# Patient Record
Sex: Female | Born: 1955
Health system: Southern US, Community
[De-identification: ages and names within clinical notes are randomized; demographics above are authoritative.]

## PROBLEM LIST (undated history)

## (undated) DIAGNOSIS — I2510 Atherosclerotic heart disease of native coronary artery without angina pectoris: Secondary | ICD-10-CM

## (undated) DIAGNOSIS — L910 Hypertrophic scar: Secondary | ICD-10-CM

## (undated) DIAGNOSIS — J302 Other seasonal allergic rhinitis: Secondary | ICD-10-CM

## (undated) DIAGNOSIS — K047 Periapical abscess without sinus: Secondary | ICD-10-CM

## (undated) DIAGNOSIS — K469 Unspecified abdominal hernia without obstruction or gangrene: Secondary | ICD-10-CM

## (undated) DIAGNOSIS — G629 Polyneuropathy, unspecified: Secondary | ICD-10-CM

## (undated) DIAGNOSIS — I10 Essential (primary) hypertension: Secondary | ICD-10-CM

## (undated) DIAGNOSIS — I779 Disorder of arteries and arterioles, unspecified: Secondary | ICD-10-CM

## (undated) DIAGNOSIS — C55 Malignant neoplasm of uterus, part unspecified: Secondary | ICD-10-CM

## (undated) DIAGNOSIS — I739 Peripheral vascular disease, unspecified: Secondary | ICD-10-CM

## (undated) DIAGNOSIS — Z9889 Other specified postprocedural states: Secondary | ICD-10-CM

## (undated) DIAGNOSIS — E611 Iron deficiency: Secondary | ICD-10-CM

## (undated) DIAGNOSIS — Z87442 Personal history of urinary calculi: Secondary | ICD-10-CM

## (undated) DIAGNOSIS — R112 Nausea with vomiting, unspecified: Secondary | ICD-10-CM

## (undated) DIAGNOSIS — Z9289 Personal history of other medical treatment: Secondary | ICD-10-CM

## (undated) DIAGNOSIS — R338 Other retention of urine: Secondary | ICD-10-CM

## (undated) DIAGNOSIS — G5603 Carpal tunnel syndrome, bilateral upper limbs: Secondary | ICD-10-CM

## (undated) DIAGNOSIS — D649 Anemia, unspecified: Secondary | ICD-10-CM

## (undated) DIAGNOSIS — E785 Hyperlipidemia, unspecified: Secondary | ICD-10-CM

## (undated) DIAGNOSIS — T7840XA Allergy, unspecified, initial encounter: Secondary | ICD-10-CM

## (undated) DIAGNOSIS — I219 Acute myocardial infarction, unspecified: Secondary | ICD-10-CM

## (undated) DIAGNOSIS — G4730 Sleep apnea, unspecified: Secondary | ICD-10-CM

## (undated) DIAGNOSIS — K648 Other hemorrhoids: Secondary | ICD-10-CM

## (undated) DIAGNOSIS — J45909 Unspecified asthma, uncomplicated: Secondary | ICD-10-CM

## (undated) DIAGNOSIS — K219 Gastro-esophageal reflux disease without esophagitis: Secondary | ICD-10-CM

## (undated) DIAGNOSIS — M1990 Unspecified osteoarthritis, unspecified site: Secondary | ICD-10-CM

## (undated) HISTORY — DX: Hypertrophic scar: L91.0

## (undated) HISTORY — DX: Essential (primary) hypertension: I10

## (undated) HISTORY — DX: Other retention of urine: R33.8

## (undated) HISTORY — DX: Gastro-esophageal reflux disease without esophagitis: K21.9

## (undated) HISTORY — DX: Other hemorrhoids: K64.8

## (undated) HISTORY — PX: CATARACT EXTRACTION: SUR2

## (undated) HISTORY — DX: Other seasonal allergic rhinitis: J30.2

## (undated) HISTORY — PX: BREAST SURGERY: SHX581

## (undated) HISTORY — DX: Unspecified osteoarthritis, unspecified site: M19.90

## (undated) HISTORY — DX: Periapical abscess without sinus: K04.7

## (undated) HISTORY — DX: Disorder of arteries and arterioles, unspecified: I77.9

## (undated) HISTORY — DX: Hyperlipidemia, unspecified: E78.5

## (undated) HISTORY — DX: Iron deficiency: E61.1

## (undated) HISTORY — DX: Atherosclerotic heart disease of native coronary artery without angina pectoris: I25.10

## (undated) HISTORY — DX: Allergy, unspecified, initial encounter: T78.40XA

## (undated) HISTORY — DX: Anemia, unspecified: D64.9

## (undated) HISTORY — PX: COLONOSCOPY W/ BIOPSIES AND POLYPECTOMY: SHX1376

## (undated) HISTORY — DX: Unspecified asthma, uncomplicated: J45.909

## (undated) HISTORY — PX: CARDIAC CATHETERIZATION: SHX172

## (undated) HISTORY — PX: ABDOMINAL HYSTERECTOMY: SHX81

## (undated) HISTORY — DX: Personal history of other medical treatment: Z92.89

## (undated) HISTORY — DX: Acute myocardial infarction, unspecified: I21.9

## (undated) HISTORY — PX: OTHER SURGICAL HISTORY: SHX169

## (undated) HISTORY — PX: WISDOM TOOTH EXTRACTION: SHX21

## (undated) HISTORY — PX: MULTIPLE TOOTH EXTRACTIONS: SHX2053

## (undated) HISTORY — DX: Malignant neoplasm of uterus, part unspecified: C55

## (undated) HISTORY — DX: Morbid (severe) obesity due to excess calories: E66.01

## (undated) HISTORY — DX: Peripheral vascular disease, unspecified: I73.9

---

## 1989-11-22 HISTORY — PX: KNEE ARTHROSCOPY: SUR90

## 1997-11-22 HISTORY — PX: KNEE SURGERY: SHX244

## 1998-07-29 ENCOUNTER — Encounter: Payer: Self-pay | Admitting: Orthopedic Surgery

## 1998-07-30 ENCOUNTER — Ambulatory Visit (HOSPITAL_COMMUNITY): Admission: RE | Admit: 1998-07-30 | Discharge: 1998-07-30 | Payer: Self-pay | Admitting: Orthopedic Surgery

## 1998-08-25 ENCOUNTER — Encounter: Admission: RE | Admit: 1998-08-25 | Discharge: 1998-11-23 | Payer: Self-pay | Admitting: Orthopedic Surgery

## 2000-06-07 ENCOUNTER — Emergency Department (HOSPITAL_COMMUNITY): Admission: EM | Admit: 2000-06-07 | Discharge: 2000-06-07 | Payer: Self-pay | Admitting: Emergency Medicine

## 2000-06-07 ENCOUNTER — Encounter: Payer: Self-pay | Admitting: Emergency Medicine

## 2001-02-26 ENCOUNTER — Emergency Department (HOSPITAL_COMMUNITY): Admission: EM | Admit: 2001-02-26 | Discharge: 2001-02-26 | Payer: Self-pay

## 2001-03-23 ENCOUNTER — Emergency Department (HOSPITAL_COMMUNITY): Admission: EM | Admit: 2001-03-23 | Discharge: 2001-03-23 | Payer: Self-pay | Admitting: Emergency Medicine

## 2001-03-23 ENCOUNTER — Encounter: Payer: Self-pay | Admitting: Emergency Medicine

## 2001-06-08 ENCOUNTER — Inpatient Hospital Stay (HOSPITAL_COMMUNITY): Admission: AD | Admit: 2001-06-08 | Discharge: 2001-06-08 | Payer: Self-pay | Admitting: Family Medicine

## 2001-06-15 ENCOUNTER — Ambulatory Visit (HOSPITAL_COMMUNITY): Admission: RE | Admit: 2001-06-15 | Discharge: 2001-06-15 | Payer: Self-pay | Admitting: Family Medicine

## 2001-06-15 ENCOUNTER — Encounter: Payer: Self-pay | Admitting: Family Medicine

## 2001-07-12 ENCOUNTER — Other Ambulatory Visit: Admission: RE | Admit: 2001-07-12 | Discharge: 2001-07-12 | Payer: Self-pay | Admitting: *Deleted

## 2001-09-20 ENCOUNTER — Encounter: Payer: Self-pay | Admitting: Family Medicine

## 2001-09-20 ENCOUNTER — Encounter: Admission: RE | Admit: 2001-09-20 | Discharge: 2001-09-20 | Payer: Self-pay | Admitting: Family Medicine

## 2001-09-29 ENCOUNTER — Ambulatory Visit (HOSPITAL_COMMUNITY): Admission: RE | Admit: 2001-09-29 | Discharge: 2001-09-29 | Payer: Self-pay | Admitting: *Deleted

## 2001-09-29 ENCOUNTER — Encounter (INDEPENDENT_AMBULATORY_CARE_PROVIDER_SITE_OTHER): Payer: Self-pay | Admitting: *Deleted

## 2004-07-30 ENCOUNTER — Ambulatory Visit: Payer: Self-pay | Admitting: Internal Medicine

## 2004-08-05 ENCOUNTER — Ambulatory Visit: Payer: Self-pay | Admitting: Internal Medicine

## 2004-09-01 ENCOUNTER — Ambulatory Visit: Payer: Self-pay | Admitting: Family Medicine

## 2004-09-02 ENCOUNTER — Ambulatory Visit: Payer: Self-pay | Admitting: *Deleted

## 2004-09-07 ENCOUNTER — Ambulatory Visit: Payer: Self-pay | Admitting: *Deleted

## 2004-09-07 ENCOUNTER — Ambulatory Visit: Payer: Self-pay | Admitting: Internal Medicine

## 2004-10-28 ENCOUNTER — Ambulatory Visit: Payer: Self-pay | Admitting: Family Medicine

## 2004-11-19 ENCOUNTER — Ambulatory Visit: Payer: Self-pay | Admitting: Family Medicine

## 2004-12-14 ENCOUNTER — Ambulatory Visit: Payer: Self-pay | Admitting: Internal Medicine

## 2005-02-10 ENCOUNTER — Ambulatory Visit: Payer: Self-pay | Admitting: Family Medicine

## 2005-04-30 ENCOUNTER — Emergency Department (HOSPITAL_COMMUNITY): Admission: EM | Admit: 2005-04-30 | Discharge: 2005-04-30 | Payer: Self-pay | Admitting: Emergency Medicine

## 2005-08-20 ENCOUNTER — Ambulatory Visit: Payer: Self-pay | Admitting: Family Medicine

## 2006-01-04 ENCOUNTER — Emergency Department (HOSPITAL_COMMUNITY): Admission: EM | Admit: 2006-01-04 | Discharge: 2006-01-04 | Payer: Self-pay | Admitting: Family Medicine

## 2006-10-12 ENCOUNTER — Inpatient Hospital Stay (HOSPITAL_COMMUNITY): Admission: EM | Admit: 2006-10-12 | Discharge: 2006-10-16 | Payer: Self-pay | Admitting: Emergency Medicine

## 2006-10-14 ENCOUNTER — Ambulatory Visit: Payer: Self-pay | Admitting: Cardiology

## 2006-10-14 ENCOUNTER — Encounter (INDEPENDENT_AMBULATORY_CARE_PROVIDER_SITE_OTHER): Payer: Self-pay | Admitting: Family Medicine

## 2006-10-14 ENCOUNTER — Encounter: Payer: Self-pay | Admitting: Cardiology

## 2006-10-14 ENCOUNTER — Encounter: Payer: Self-pay | Admitting: Vascular Surgery

## 2006-10-16 ENCOUNTER — Encounter (INDEPENDENT_AMBULATORY_CARE_PROVIDER_SITE_OTHER): Payer: Self-pay | Admitting: Family Medicine

## 2006-10-25 ENCOUNTER — Ambulatory Visit: Payer: Self-pay | Admitting: Family Medicine

## 2007-04-28 ENCOUNTER — Emergency Department (HOSPITAL_COMMUNITY): Admission: EM | Admit: 2007-04-28 | Discharge: 2007-04-28 | Payer: Self-pay | Admitting: Emergency Medicine

## 2007-06-30 ENCOUNTER — Encounter (INDEPENDENT_AMBULATORY_CARE_PROVIDER_SITE_OTHER): Payer: Self-pay | Admitting: Family Medicine

## 2007-06-30 DIAGNOSIS — J309 Allergic rhinitis, unspecified: Secondary | ICD-10-CM

## 2007-06-30 DIAGNOSIS — I10 Essential (primary) hypertension: Secondary | ICD-10-CM | POA: Insufficient documentation

## 2007-06-30 DIAGNOSIS — J45909 Unspecified asthma, uncomplicated: Secondary | ICD-10-CM | POA: Insufficient documentation

## 2007-08-02 ENCOUNTER — Ambulatory Visit: Payer: Self-pay | Admitting: Family Medicine

## 2007-08-02 ENCOUNTER — Telehealth (INDEPENDENT_AMBULATORY_CARE_PROVIDER_SITE_OTHER): Payer: Self-pay | Admitting: *Deleted

## 2007-08-08 ENCOUNTER — Ambulatory Visit: Payer: Self-pay | Admitting: Family Medicine

## 2007-08-08 DIAGNOSIS — L299 Pruritus, unspecified: Secondary | ICD-10-CM | POA: Insufficient documentation

## 2007-08-08 LAB — CONVERTED CEMR LAB
AST: 16 units/L (ref 0–37)
Alkaline Phosphatase: 45 units/L (ref 39–117)
BUN: 11 mg/dL (ref 6–23)
CO2: 26 meq/L (ref 19–32)
Calcium: 9.2 mg/dL (ref 8.4–10.5)
Chloride: 102 meq/L (ref 96–112)
Creatinine, Ser: 0.75 mg/dL (ref 0.40–1.20)
Glucose, Bld: 102 mg/dL — ABNORMAL HIGH (ref 70–99)
HDL: 51 mg/dL (ref >39)
Potassium: 4.1 meq/L (ref 3.5–5.3)
Sodium: 140 meq/L (ref 135–145)
Total Bilirubin: 0.6 mg/dL (ref 0.3–1.2)

## 2007-08-12 ENCOUNTER — Emergency Department (HOSPITAL_COMMUNITY): Admission: EM | Admit: 2007-08-12 | Discharge: 2007-08-13 | Payer: Self-pay | Admitting: Emergency Medicine

## 2007-08-28 ENCOUNTER — Telehealth (INDEPENDENT_AMBULATORY_CARE_PROVIDER_SITE_OTHER): Payer: Self-pay | Admitting: Family Medicine

## 2007-09-18 ENCOUNTER — Encounter (INDEPENDENT_AMBULATORY_CARE_PROVIDER_SITE_OTHER): Payer: Self-pay | Admitting: Family Medicine

## 2007-10-31 ENCOUNTER — Ambulatory Visit: Payer: Self-pay | Admitting: Family Medicine

## 2007-10-31 DIAGNOSIS — E785 Hyperlipidemia, unspecified: Secondary | ICD-10-CM

## 2007-11-23 HISTORY — PX: COLONOSCOPY: SHX174

## 2008-01-09 ENCOUNTER — Emergency Department (HOSPITAL_COMMUNITY): Admission: EM | Admit: 2008-01-09 | Discharge: 2008-01-09 | Payer: Self-pay | Admitting: Emergency Medicine

## 2008-01-16 ENCOUNTER — Telehealth (INDEPENDENT_AMBULATORY_CARE_PROVIDER_SITE_OTHER): Payer: Self-pay | Admitting: Family Medicine

## 2008-01-25 ENCOUNTER — Encounter (INDEPENDENT_AMBULATORY_CARE_PROVIDER_SITE_OTHER): Payer: Self-pay | Admitting: *Deleted

## 2008-05-01 ENCOUNTER — Ambulatory Visit: Payer: Self-pay | Admitting: Family Medicine

## 2008-05-20 ENCOUNTER — Encounter (INDEPENDENT_AMBULATORY_CARE_PROVIDER_SITE_OTHER): Payer: Self-pay | Admitting: Family Medicine

## 2008-06-24 ENCOUNTER — Ambulatory Visit: Payer: Self-pay | Admitting: Nurse Practitioner

## 2008-06-24 DIAGNOSIS — J329 Chronic sinusitis, unspecified: Secondary | ICD-10-CM

## 2008-07-01 ENCOUNTER — Emergency Department (HOSPITAL_COMMUNITY): Admission: EM | Admit: 2008-07-01 | Discharge: 2008-07-01 | Payer: Self-pay | Admitting: Emergency Medicine

## 2008-07-05 ENCOUNTER — Telehealth (INDEPENDENT_AMBULATORY_CARE_PROVIDER_SITE_OTHER): Payer: Self-pay | Admitting: Family Medicine

## 2008-08-05 ENCOUNTER — Encounter (INDEPENDENT_AMBULATORY_CARE_PROVIDER_SITE_OTHER): Payer: Self-pay | Admitting: *Deleted

## 2008-08-06 ENCOUNTER — Ambulatory Visit: Payer: Self-pay | Admitting: Family Medicine

## 2008-08-06 DIAGNOSIS — K625 Hemorrhage of anus and rectum: Secondary | ICD-10-CM

## 2008-08-06 DIAGNOSIS — K219 Gastro-esophageal reflux disease without esophagitis: Secondary | ICD-10-CM

## 2008-08-06 DIAGNOSIS — K5900 Constipation, unspecified: Secondary | ICD-10-CM | POA: Insufficient documentation

## 2008-08-07 ENCOUNTER — Encounter (INDEPENDENT_AMBULATORY_CARE_PROVIDER_SITE_OTHER): Payer: Self-pay | Admitting: Family Medicine

## 2008-08-15 ENCOUNTER — Ambulatory Visit: Payer: Self-pay | Admitting: Family Medicine

## 2008-08-15 LAB — CONVERTED CEMR LAB
BUN: 12 mg/dL (ref 6–23)
CO2: 26 meq/L (ref 19–32)
Calcium: 9.3 mg/dL (ref 8.4–10.5)
Chloride: 106 meq/L (ref 96–112)
Eosinophils Relative: 1 % (ref 0–5)
HCT: 39.7 % (ref 36.0–46.0)
LDL Cholesterol: 120 mg/dL — ABNORMAL HIGH (ref 0–99)
MCHC: 31.2 g/dL (ref 30.0–36.0)
MCV: 86.9 fL (ref 78.0–100.0)
Monocytes Relative: 7 % (ref 3–12)
Neutrophils Relative %: 57 % (ref 43–77)
Platelets: 255 10*3/uL (ref 150–400)
Potassium: 4.2 meq/L (ref 3.5–5.3)
RBC: 4.57 M/uL (ref 3.87–5.11)
RDW: 16.1 % — ABNORMAL HIGH (ref 11.5–15.5)
TSH: 1.222 microintl units/mL (ref 0.350–4.50)
Total Bilirubin: 0.8 mg/dL (ref 0.3–1.2)
Total CHOL/HDL Ratio: 3.4
Total Protein: 7.1 g/dL (ref 6.0–8.3)
Triglycerides: 68 mg/dL (ref <150)
WBC: 7 10*3/uL (ref 4.0–10.5)

## 2008-08-19 ENCOUNTER — Telehealth (INDEPENDENT_AMBULATORY_CARE_PROVIDER_SITE_OTHER): Payer: Self-pay | Admitting: Family Medicine

## 2008-08-19 ENCOUNTER — Encounter (INDEPENDENT_AMBULATORY_CARE_PROVIDER_SITE_OTHER): Payer: Self-pay | Admitting: Family Medicine

## 2008-08-26 ENCOUNTER — Encounter (INDEPENDENT_AMBULATORY_CARE_PROVIDER_SITE_OTHER): Payer: Self-pay | Admitting: Family Medicine

## 2008-09-04 ENCOUNTER — Ambulatory Visit (HOSPITAL_COMMUNITY): Admission: RE | Admit: 2008-09-04 | Discharge: 2008-09-04 | Payer: Self-pay

## 2008-09-04 ENCOUNTER — Encounter (INDEPENDENT_AMBULATORY_CARE_PROVIDER_SITE_OTHER): Payer: Self-pay | Admitting: Family Medicine

## 2008-09-05 ENCOUNTER — Encounter (INDEPENDENT_AMBULATORY_CARE_PROVIDER_SITE_OTHER): Payer: Self-pay | Admitting: Family Medicine

## 2008-10-03 ENCOUNTER — Encounter (INDEPENDENT_AMBULATORY_CARE_PROVIDER_SITE_OTHER): Payer: Self-pay | Admitting: Family Medicine

## 2008-10-23 ENCOUNTER — Encounter (INDEPENDENT_AMBULATORY_CARE_PROVIDER_SITE_OTHER): Payer: Self-pay | Admitting: Family Medicine

## 2009-01-07 ENCOUNTER — Ambulatory Visit: Payer: Self-pay | Admitting: Family Medicine

## 2009-01-07 DIAGNOSIS — H6120 Impacted cerumen, unspecified ear: Secondary | ICD-10-CM | POA: Insufficient documentation

## 2009-01-07 DIAGNOSIS — M21619 Bunion of unspecified foot: Secondary | ICD-10-CM

## 2009-01-07 DIAGNOSIS — M25569 Pain in unspecified knee: Secondary | ICD-10-CM

## 2009-01-15 ENCOUNTER — Ambulatory Visit (HOSPITAL_COMMUNITY): Admission: RE | Admit: 2009-01-15 | Discharge: 2009-01-15 | Payer: Self-pay | Admitting: Family Medicine

## 2009-01-16 ENCOUNTER — Telehealth (INDEPENDENT_AMBULATORY_CARE_PROVIDER_SITE_OTHER): Payer: Self-pay | Admitting: Family Medicine

## 2009-01-21 ENCOUNTER — Ambulatory Visit: Payer: Self-pay | Admitting: Family Medicine

## 2009-01-21 DIAGNOSIS — K089 Disorder of teeth and supporting structures, unspecified: Secondary | ICD-10-CM | POA: Insufficient documentation

## 2009-02-07 ENCOUNTER — Encounter (INDEPENDENT_AMBULATORY_CARE_PROVIDER_SITE_OTHER): Payer: Self-pay | Admitting: Family Medicine

## 2009-02-19 ENCOUNTER — Encounter: Admission: RE | Admit: 2009-02-19 | Discharge: 2009-02-19 | Payer: Self-pay | Admitting: Orthopedic Surgery

## 2009-07-25 ENCOUNTER — Emergency Department (HOSPITAL_COMMUNITY): Admission: EM | Admit: 2009-07-25 | Discharge: 2009-07-25 | Payer: Self-pay | Admitting: Emergency Medicine

## 2009-08-19 ENCOUNTER — Encounter: Admission: RE | Admit: 2009-08-19 | Discharge: 2009-08-19 | Payer: Self-pay | Admitting: Orthopedic Surgery

## 2009-08-20 ENCOUNTER — Encounter (INDEPENDENT_AMBULATORY_CARE_PROVIDER_SITE_OTHER): Payer: Self-pay | Admitting: Family Medicine

## 2009-09-16 ENCOUNTER — Encounter (INDEPENDENT_AMBULATORY_CARE_PROVIDER_SITE_OTHER): Payer: Self-pay | Admitting: Nurse Practitioner

## 2009-09-18 ENCOUNTER — Ambulatory Visit: Payer: Self-pay | Admitting: Nurse Practitioner

## 2009-09-18 DIAGNOSIS — H9319 Tinnitus, unspecified ear: Secondary | ICD-10-CM | POA: Insufficient documentation

## 2009-09-22 ENCOUNTER — Encounter (INDEPENDENT_AMBULATORY_CARE_PROVIDER_SITE_OTHER): Payer: Self-pay | Admitting: Nurse Practitioner

## 2009-09-22 LAB — CONVERTED CEMR LAB
AST: 18 units/L (ref 0–37)
Albumin: 4.1 g/dL (ref 3.5–5.2)
Anti Nuclear Antibody(ANA): NEGATIVE
BUN: 9 mg/dL (ref 6–23)
Calcium: 9.6 mg/dL (ref 8.4–10.5)
Chloride: 103 meq/L (ref 96–112)
Eosinophils Relative: 3 % (ref 0–5)
Free T4: 1.1 ng/dL (ref 0.80–1.80)
HCT: 41.4 % (ref 36.0–46.0)
HDL: 49 mg/dL (ref >39)
Hemoglobin: 13.4 g/dL (ref 12.0–15.0)
Lymphocytes Relative: 43 % (ref 12–46)
Lymphs Abs: 2.6 10*3/uL (ref 0.7–4.0)
Monocytes Absolute: 0.4 10*3/uL (ref 0.1–1.0)
Platelets: 243 10*3/uL (ref 150–400)
Potassium: 4.5 meq/L (ref 3.5–5.3)
Rhuematoid fact SerPl-aCnc: 22 intl units/mL — ABNORMAL HIGH (ref 0–20)
TSH: 1.641 microintl units/mL (ref 0.350–4.500)
Total Protein: 7.2 g/dL (ref 6.0–8.3)
WBC: 6 10*3/uL (ref 4.0–10.5)

## 2009-10-14 ENCOUNTER — Ambulatory Visit: Payer: Self-pay | Admitting: Nurse Practitioner

## 2009-10-14 DIAGNOSIS — H6690 Otitis media, unspecified, unspecified ear: Secondary | ICD-10-CM | POA: Insufficient documentation

## 2009-10-21 ENCOUNTER — Ambulatory Visit (HOSPITAL_COMMUNITY): Admission: RE | Admit: 2009-10-21 | Discharge: 2009-10-21 | Payer: Self-pay | Admitting: Internal Medicine

## 2009-10-28 ENCOUNTER — Telehealth (INDEPENDENT_AMBULATORY_CARE_PROVIDER_SITE_OTHER): Payer: Self-pay | Admitting: Family Medicine

## 2009-12-15 ENCOUNTER — Ambulatory Visit: Payer: Self-pay | Admitting: Nurse Practitioner

## 2009-12-15 DIAGNOSIS — R319 Hematuria, unspecified: Secondary | ICD-10-CM

## 2009-12-15 DIAGNOSIS — K644 Residual hemorrhoidal skin tags: Secondary | ICD-10-CM | POA: Insufficient documentation

## 2009-12-16 ENCOUNTER — Encounter (INDEPENDENT_AMBULATORY_CARE_PROVIDER_SITE_OTHER): Payer: Self-pay | Admitting: Nurse Practitioner

## 2009-12-17 ENCOUNTER — Encounter (INDEPENDENT_AMBULATORY_CARE_PROVIDER_SITE_OTHER): Payer: Self-pay | Admitting: Nurse Practitioner

## 2010-01-20 HISTORY — PX: TEE WITHOUT CARDIOVERSION: SHX5443

## 2010-01-20 HISTORY — PX: NM MYOCAR PERF WALL MOTION: HXRAD629

## 2010-01-27 ENCOUNTER — Observation Stay (HOSPITAL_COMMUNITY): Admission: EM | Admit: 2010-01-27 | Discharge: 2010-01-30 | Payer: Self-pay | Admitting: Emergency Medicine

## 2010-01-28 ENCOUNTER — Encounter (INDEPENDENT_AMBULATORY_CARE_PROVIDER_SITE_OTHER): Payer: Self-pay | Admitting: Internal Medicine

## 2010-01-28 ENCOUNTER — Ambulatory Visit: Payer: Self-pay | Admitting: Vascular Surgery

## 2010-01-30 ENCOUNTER — Encounter (INDEPENDENT_AMBULATORY_CARE_PROVIDER_SITE_OTHER): Payer: Self-pay | Admitting: Cardiology

## 2010-02-12 ENCOUNTER — Ambulatory Visit: Payer: Self-pay | Admitting: Nurse Practitioner

## 2010-02-12 DIAGNOSIS — N959 Unspecified menopausal and perimenopausal disorder: Secondary | ICD-10-CM | POA: Insufficient documentation

## 2010-02-18 ENCOUNTER — Encounter (INDEPENDENT_AMBULATORY_CARE_PROVIDER_SITE_OTHER): Payer: Self-pay | Admitting: Nurse Practitioner

## 2010-02-18 ENCOUNTER — Ambulatory Visit (HOSPITAL_COMMUNITY): Admission: RE | Admit: 2010-02-18 | Discharge: 2010-02-18 | Payer: Self-pay | Admitting: Nurse Practitioner

## 2010-02-19 ENCOUNTER — Encounter (INDEPENDENT_AMBULATORY_CARE_PROVIDER_SITE_OTHER): Payer: Self-pay | Admitting: Nurse Practitioner

## 2010-02-27 ENCOUNTER — Encounter (INDEPENDENT_AMBULATORY_CARE_PROVIDER_SITE_OTHER): Payer: Self-pay | Admitting: Nurse Practitioner

## 2010-03-03 ENCOUNTER — Encounter (INDEPENDENT_AMBULATORY_CARE_PROVIDER_SITE_OTHER): Payer: Self-pay | Admitting: Nurse Practitioner

## 2010-03-04 ENCOUNTER — Encounter (INDEPENDENT_AMBULATORY_CARE_PROVIDER_SITE_OTHER): Payer: Self-pay | Admitting: Nurse Practitioner

## 2010-03-17 ENCOUNTER — Ambulatory Visit (HOSPITAL_BASED_OUTPATIENT_CLINIC_OR_DEPARTMENT_OTHER): Admission: RE | Admit: 2010-03-17 | Discharge: 2010-03-17 | Payer: Self-pay | Admitting: Nurse Practitioner

## 2010-03-17 ENCOUNTER — Encounter (INDEPENDENT_AMBULATORY_CARE_PROVIDER_SITE_OTHER): Payer: Self-pay | Admitting: Nurse Practitioner

## 2010-03-21 ENCOUNTER — Ambulatory Visit: Payer: Self-pay | Admitting: Internal Medicine

## 2010-03-27 ENCOUNTER — Encounter (INDEPENDENT_AMBULATORY_CARE_PROVIDER_SITE_OTHER): Payer: Self-pay | Admitting: Nurse Practitioner

## 2010-03-27 DIAGNOSIS — G4733 Obstructive sleep apnea (adult) (pediatric): Secondary | ICD-10-CM

## 2010-04-10 ENCOUNTER — Ambulatory Visit: Payer: Self-pay | Admitting: Nurse Practitioner

## 2010-04-24 ENCOUNTER — Ambulatory Visit: Payer: Self-pay | Admitting: Nurse Practitioner

## 2010-05-28 ENCOUNTER — Ambulatory Visit (HOSPITAL_COMMUNITY): Admission: RE | Admit: 2010-05-28 | Discharge: 2010-05-28 | Payer: Self-pay | Admitting: Orthopedic Surgery

## 2010-06-03 ENCOUNTER — Encounter (INDEPENDENT_AMBULATORY_CARE_PROVIDER_SITE_OTHER): Payer: Self-pay | Admitting: Nurse Practitioner

## 2010-06-08 ENCOUNTER — Encounter (INDEPENDENT_AMBULATORY_CARE_PROVIDER_SITE_OTHER): Payer: Self-pay | Admitting: Nurse Practitioner

## 2010-06-12 ENCOUNTER — Ambulatory Visit: Payer: Self-pay | Admitting: Nurse Practitioner

## 2010-06-18 ENCOUNTER — Encounter: Admission: RE | Admit: 2010-06-18 | Discharge: 2010-07-28 | Payer: Self-pay | Admitting: Orthopedic Surgery

## 2010-06-19 ENCOUNTER — Encounter (INDEPENDENT_AMBULATORY_CARE_PROVIDER_SITE_OTHER): Payer: Self-pay | Admitting: Nurse Practitioner

## 2010-07-24 ENCOUNTER — Ambulatory Visit: Payer: Self-pay | Admitting: Nurse Practitioner

## 2010-08-06 ENCOUNTER — Ambulatory Visit (HOSPITAL_COMMUNITY): Admission: RE | Admit: 2010-08-06 | Discharge: 2010-08-07 | Payer: Self-pay | Admitting: Orthopedic Surgery

## 2010-08-09 ENCOUNTER — Emergency Department (HOSPITAL_COMMUNITY)
Admission: EM | Admit: 2010-08-09 | Discharge: 2010-08-10 | Payer: Self-pay | Source: Home / Self Care | Admitting: Emergency Medicine

## 2010-09-25 ENCOUNTER — Ambulatory Visit: Payer: Self-pay | Admitting: Nurse Practitioner

## 2010-09-25 LAB — CONVERTED CEMR LAB
ALT: 20 units/L (ref 0–35)
AST: 21 units/L (ref 0–37)
Alkaline Phosphatase: 48 units/L (ref 39–117)
BUN: 14 mg/dL (ref 6–23)
Basophils Absolute: 0 10*3/uL (ref 0.0–0.1)
Basophils Relative: 0 % (ref 0–1)
Calcium: 9.6 mg/dL (ref 8.4–10.5)
Chloride: 98 meq/L (ref 96–112)
Creatinine, Ser: 0.89 mg/dL (ref 0.40–1.20)
Eosinophils Absolute: 0.1 10*3/uL (ref 0.0–0.7)
HDL: 50 mg/dL (ref >39)
Hemoglobin: 13 g/dL (ref 12.0–15.0)
LDL Cholesterol: 94 mg/dL (ref 0–99)
MCHC: 32.7 g/dL (ref 30.0–36.0)
MCV: 85 fL (ref 78.0–100.0)
Monocytes Absolute: 0.4 10*3/uL (ref 0.1–1.0)
Monocytes Relative: 6 % (ref 3–12)
Neutro Abs: 3.8 10*3/uL (ref 1.7–7.7)
Neutrophils Relative %: 56 % (ref 43–77)
RDW: 15.1 % (ref 11.5–15.5)
TSH: 1.503 microintl units/mL (ref 0.350–4.500)
Total CHOL/HDL Ratio: 3.2
VLDL: 18 mg/dL (ref 0–40)

## 2010-09-28 ENCOUNTER — Encounter (INDEPENDENT_AMBULATORY_CARE_PROVIDER_SITE_OTHER): Payer: Self-pay | Admitting: Nurse Practitioner

## 2010-10-23 ENCOUNTER — Ambulatory Visit (HOSPITAL_COMMUNITY): Admission: RE | Admit: 2010-10-23 | Payer: Self-pay | Admitting: Internal Medicine

## 2010-11-04 ENCOUNTER — Telehealth (INDEPENDENT_AMBULATORY_CARE_PROVIDER_SITE_OTHER): Payer: Self-pay | Admitting: Nurse Practitioner

## 2010-11-05 ENCOUNTER — Ambulatory Visit: Payer: Self-pay | Admitting: Nurse Practitioner

## 2010-11-22 ENCOUNTER — Emergency Department (HOSPITAL_COMMUNITY)
Admission: EM | Admit: 2010-11-22 | Discharge: 2010-11-22 | Payer: Self-pay | Source: Home / Self Care | Admitting: Emergency Medicine

## 2010-11-22 LAB — CONVERTED CEMR LAB
Albumin: 3.6 g/dL
Basophils Absolute: 0 10*3/uL
CO2: 29 meq/L
Calcium: 9.5 mg/dL
Chloride: 100 meq/L
Glucose, Bld: 139 mg/dL
Hemoglobin: 13.5 g/dL
Lymphocytes Relative: 35 %
Lymphs Abs: 2.7 10*3/uL
Neutro Abs: 4.4 10*3/uL
Platelets: 249 10*3/uL
RDW: 14.4 %
Sodium: 138 meq/L
Total Protein: 7.9 g/dL
WBC: 7.8 10*3/uL

## 2010-12-01 ENCOUNTER — Encounter (INDEPENDENT_AMBULATORY_CARE_PROVIDER_SITE_OTHER): Payer: Self-pay | Admitting: Nurse Practitioner

## 2010-12-04 ENCOUNTER — Ambulatory Visit
Admission: RE | Admit: 2010-12-04 | Discharge: 2010-12-04 | Payer: Self-pay | Source: Home / Self Care | Attending: Nurse Practitioner | Admitting: Nurse Practitioner

## 2010-12-04 ENCOUNTER — Encounter (INDEPENDENT_AMBULATORY_CARE_PROVIDER_SITE_OTHER): Payer: Self-pay | Admitting: Nurse Practitioner

## 2010-12-04 LAB — CONVERTED CEMR LAB
Cholesterol: 190 mg/dL (ref 0–200)
LDL Cholesterol: 123 mg/dL — ABNORMAL HIGH (ref 0–99)
Triglycerides: 80 mg/dL (ref <150)

## 2010-12-07 ENCOUNTER — Encounter (INDEPENDENT_AMBULATORY_CARE_PROVIDER_SITE_OTHER): Payer: Self-pay | Admitting: Nurse Practitioner

## 2010-12-13 ENCOUNTER — Encounter: Payer: Self-pay | Admitting: Family Medicine

## 2010-12-13 ENCOUNTER — Encounter: Payer: Self-pay | Admitting: Internal Medicine

## 2010-12-13 ENCOUNTER — Encounter: Payer: Self-pay | Admitting: Orthopedic Surgery

## 2010-12-14 ENCOUNTER — Encounter: Payer: Self-pay | Admitting: Occupational Therapy

## 2010-12-18 ENCOUNTER — Ambulatory Visit (HOSPITAL_COMMUNITY)
Admission: RE | Admit: 2010-12-18 | Discharge: 2010-12-18 | Payer: Self-pay | Source: Home / Self Care | Attending: Internal Medicine | Admitting: Internal Medicine

## 2010-12-20 LAB — CONVERTED CEMR LAB
AST: 20 units/L (ref 0–37)
Albumin: 4.2 g/dL (ref 3.5–5.2)
Alkaline Phosphatase: 41 units/L (ref 39–117)
Chlamydia, DNA Probe: NEGATIVE
GC Probe Amp, Genital: NEGATIVE
HDL: 44 mg/dL (ref >39)
Indirect Bilirubin: 0.5 mg/dL (ref 0.0–0.9)
Nitrite: NEGATIVE
Total Protein: 7.7 g/dL (ref 6.0–8.3)
Triglycerides: 93 mg/dL (ref <150)
Urobilinogen, UA: 1
WBC Urine, dipstick: NEGATIVE

## 2010-12-22 NOTE — Assessment & Plan Note (Signed)
Summary: HTN/Hypercholesterolemia   Vital Signs:  Patient profile:   55 year old female Menstrual status:  postmenopausal Weight:      319.7 pounds BMI:     48.79 Temp:     97.5 degrees F oral Pulse rate:   80 / minute Pulse rhythm:   regular Resp:     20 per minute BP sitting:   150 / 80  (left arm) Cuff size:   large  Vitals Entered By: Levon Hedger (July 24, 2010 8:36 AM)  Nutrition Counseling: Patient's BMI is greater than 25 and therefore counseled on weight management options. CC: follow-up visit...left ear still throbbing, Hypertension Management, Lipid Management, Abdominal Pain Is Patient Diabetic? No Pain Assessment Patient in pain? yes     Location: knee, gums Intensity: 7,2  Does patient need assistance? Functional Status Self care Ambulation Normal   Primary Care Provider:  Rankins  CC:  follow-up visit...left ear still throbbing, Hypertension Management, Lipid Management, and Abdominal Pain.  History of Present Illness:  Pt into the office for f/u - HTN  S/p right arthroscopy on 05/28/2010 (pt is recovering well) Pt went to ortho on yesterday and she will need left knee intervention; unsure if she can have arthroscopy or TKR. Physical therapy twice per week (Tuesday and Thursday)  Oral surgery - s/p 6 dental extractions and she had an RX for demoral as needed for pain.  She does have to get some other dental work completed on next week.  Medications present today with pt  Obesity - down 4 pounds since last visit  "Probably because i can't eat"  Dyspepsia History:      She has no alarm features of dyspepsia including no history of melena, hematochezia, dysphagia, persistent vomiting, or involuntary weight loss > 5%.  There is a prior history of GERD.  The patient does not have a prior history of documented ulcer disease.  The dominant symptom is heartburn or acid reflux.  An H-2 blocker medication is not currently being taken.  She has no history  of a positive H. Pylori serology.  No previous upper endoscopy has been done.    Hypertension History:      She denies headache, chest pain, and palpitations.  She notes no problems with any antihypertensive medication side effects.  pt just took diovan 30 minutes before she came into the office today.        Positive major cardiovascular risk factors include female age 68 years old or older, hyperlipidemia, and hypertension.  Negative major cardiovascular risk factors include no history of diabetes, negative family history for ischemic heart disease, and non-tobacco-user status.        Further assessment for target organ damage reveals no history of ASHD, cardiac end-organ damage (CHF/LVH), stroke/TIA, peripheral vascular disease, renal insufficiency, or hypertensive retinopathy.    Lipid Management History:      Positive NCEP/ATP III risk factors include female age 4 years old or older and hypertension.  Negative NCEP/ATP III risk factors include no history of early menopause without estrogen hormone replacement, non-diabetic, no family history for ischemic heart disease, non-tobacco-user status, no ASHD (atherosclerotic heart disease), no prior stroke/TIA, no peripheral vascular disease, and no history of aortic aneurysm.        The patient states that she does not know about the "Therapeutic Lifestyle Change" diet.  The patient does not know about adjunctive measures for cholesterol lowering.  Adjunctive measures started by the patient include weight reduction.  She expresses no  side effects from her lipid-lowering medication.  The patient denies any symptoms to suggest myopathy or liver disease.      Habits & Providers  Alcohol-Tobacco-Diet     Alcohol drinks/day: 0     Tobacco Status: never     Passive Smoke Exposure: yes  Exercise-Depression-Behavior     Does Patient Exercise: no     Have you felt down or hopeless? no     Have you felt little pleasure in things? no     Depression  Counseling: not indicated; screening negative for depression     Drug Use: no     Seat Belt Use: 50     Sun Exposure: occasionally  Allergies (verified): 1)  ! Penicillin 2)  ! Codeine 3)  ! Percocet 4)  ! * Latex  Review of Systems ENT:  Complains of earache; still with left ear throbbing; s/p ear irrigation on left ear during the last visit.  Pt reports that she had a hearing test at an outside clinic and one of her ears was poor.. CV:  Denies chest pain or discomfort. Resp:  Denies cough. GI:  Denies abdominal pain, nausea, and vomiting. MS:  Complains of joint pain; right knee and bilateral shoulders. Neuro:  Complains of numbness and tingling; bil hands.  Physical Exam  General:  alert.  obese Head:  normocephalic.   Ears:  bil Tm with bony landmarks visible - no erythema Mouth:  pharynx pink and moist.   Msk:  up to exam table - no asssit Neurologic:  alert & oriented X3.   Skin:  color normal.   Psych:  Oriented X3.     Impression & Recommendations:  Problem # 1:  HYPERTENSION (ICD-401.9) BP is elevated today but pt just took meds 30 minutes before this visit Reminded of DASH diet Her updated medication list for this problem includes:    Diovan Hct 160-25 Mg Tabs (Valsartan-hydrochlorothiazide) ..... One tablet by mouth daily  Problem # 2:  OBESITY (ICD-278.00) down 4 pounds since last visit - pt is still not able to exercise due to knee pain so continue to advise pt to monitor diet  Problem # 3:  KNEE PAIN, BILATERAL (ICD-719.46) s/p right knee arthoscopy on 05/28/2010 and pt is still getting better she is pending intervention on left knee  arthrotec as needed  The following medications were removed from the medication list:    Tramadol-acetaminophen 37.5-325 Mg Tabs (Tramadol-acetaminophen) .Marland Kitchen... Take 1-2 tablets by mouth every 4 hours as needed *arthur francis carter Her updated medication list for this problem includes:    Aspirin Ec Low Strength 81 Mg Tbec  (Aspirin) ..... One tablet by mouth daily    Arthrotec 75-200 Mg-mcg Tabs (Diclofenac-misoprostol) ..... One tablet by mouth two times a day for joints  Problem # 4:  HYPERLIPIDEMIA (ICD-272.4) will check labs on next visit Her updated medication list for this problem includes:    Pravastatin Sodium 20 Mg Tabs (Pravastatin sodium) ..... One tablet by mouth nightly for cholesterol  Complete Medication List: 1)  Diovan Hct 160-25 Mg Tabs (Valsartan-hydrochlorothiazide) .... One tablet by mouth daily 2)  Pravastatin Sodium 20 Mg Tabs (Pravastatin sodium) .... One tablet by mouth nightly for cholesterol 3)  Loratadine 10 Mg Tabs (Loratadine) .... One tablet by mouth daily for allergies 4)  Nystatin-triamcinolone 100000-0.1 Unit/gm-% Oint (Nystatin-triamcinolone) .... Apply to affected area two times a day until healed 5)  Aspirin Ec Low Strength 81 Mg Tbec (Aspirin) .... One tablet by mouth  daily 6)  Arthrotec 75-200 Mg-mcg Tabs (Diclofenac-misoprostol) .... One tablet by mouth two times a day for joints 7)  Restasis 0.05 % Emul (Cyclosporine) 8)  Nexium 40 Mg Cpdr (Esomeprazole magnesium) .... One tablet by mouth daily before breakfast for her stomach  Hypertension Assessment/Plan:      The patient's hypertensive risk group is category B: At least one risk factor (excluding diabetes) with no target organ damage.  Her calculated 10 year risk of coronary heart disease is 11 %.  Today's blood pressure is 150/80.  Her blood pressure goal is < 140/90.  Lipid Assessment/Plan:      Based on NCEP/ATP III, the patient's risk factor category is "2 or more risk factors and a calculated 10 year CAD risk of < 20%".  The patient's lipid goals are as follows: Total cholesterol goal is 200; LDL cholesterol goal is 130; HDL cholesterol goal is 40; Triglyceride goal is 150.  Her LDL cholesterol goal has not been met.     Patient Instructions: 1)  Schedule follow up appointment for 2 months 2)  Come fasting  before this visit - no food after midnight before this visit. 3)  Do take your blood pressure medications at least 2 hours before this visit. 4)  Your weight is down 4 pounds since the last visit - this is good. Continue to modify your diet in an effort to decrease calories. 5)  Refills on your arthrotec and pravastatin will be sent to [facility] Prescriptions: ARTHROTEC 75-200 MG-MCG TABS (DICLOFENAC-MISOPROSTOL) One tablet by mouth two times a day for joints  #60 x 5   Entered and Authorized by:   Lehman Prom FNP   Signed by:   Lehman Prom FNP on 07/24/2010   Method used:   Electronically to        [facility] [phone]* (retail)       [address]       [place], [place]  [address]       Ph: [phone]       Fax: [phone]   RxID:   [id number] PRAVASTATIN SODIUM 20 MG  TABS (PRAVASTATIN SODIUM) One tablet by mouth nightly for cholesterol  #30 x 5   Entered and Authorized by:   Lehman Prom FNP   Signed by:   Lehman Prom FNP on 07/24/2010   Method used:   Electronically to        [facility] [phone]* (retail)       [address]       [place], [place]  [address]       Ph: [phone]       Fax: [phone]   RxID:   [id number]

## 2010-12-22 NOTE — Letter (Signed)
Summary: Handout Printed  Printed Handout:  - Hemorrhoids 

## 2010-12-22 NOTE — Letter (Signed)
Summary: *HSN Results Follow up  [facility]  [address] [place], [place] [address]   Phone: [phone]  Fax: [phone]      02/27/2010   Calirose A Bednarz [address] [place], [place]  [address]   Dear  Ms. Sophronia Cafarella,                            ____S.Drinkard,FNP   ____D. Gore,FNP       ____B. McPherson,MD   ____V. Rankins,MD    ____E. Mulberry,MD    __X__N. Daphine Deutscher, FNP  ____D. Reche Dixon, MD    ____K. Philipp Deputy, MD    ____Other     This letter is to inform you that your recent test(s):  Bone Density     ____X___ is within acceptable limits  _______ requires a medication change  _______ requires a follow-up lab visit  _______ requires a follow-up visit with your provider   Comments: Bone density is normal.       _________________________________________________________ If you have any questions, please contact our office [phone].                    Sincerely,    Lehman Prom FNP [facility]

## 2010-12-22 NOTE — Letter (Signed)
Summary: TEST ORDER FORM DEXA SCAN//APPT DATE & TIME  TEST ORDER FORM DEXA SCAN//APPT DATE & TIME   Imported By: Arta Bruce 04/10/2010 15:26:25  _____________________________________________________________________  External Attachment:    Type:   Image     Comment:   External Document

## 2010-12-22 NOTE — Letter (Signed)
Summary: [facility]  [facility]   Imported By: Arta Bruce 07/09/2010 10:31:34  _____________________________________________________________________  External Attachment:    Type:   Image     Comment:   External Document

## 2010-12-22 NOTE — Miscellaneous (Signed)
Summary: Sleep study results  Phone Note Outgoing Call   Summary of Call: notify pt that sleep study shows MILD obstructive sleep apnea no need for cpap she may try to change positions at night to sleep on sides instead of back Initial call taken by: Lehman Prom FNP,  Mar 27, 2010 12:58 PM  New Problems: SLEEP APNEA, OBSTRUCTIVE, MILD (ICD-327.23)   New Problems: SLEEP APNEA, OBSTRUCTIVE, MILD (ICD-327.23) Clinical Lists Changes  Problems: Added new problem of SLEEP APNEA, OBSTRUCTIVE, MILD (ICD-327.23) - sleep study 03/17/2010

## 2010-12-22 NOTE — Assessment & Plan Note (Signed)
Summary: Hospital F/u - Syncope   Vital Signs:  Patient profile:   55 year old female Menstrual status:  postmenopausal Weight:      324.4 pounds BMI:     49.50 BSA:     2.51 Temp:     97.4 degrees F oral Pulse rate:   84 / minute Pulse rhythm:   regular Resp:     20 per minute BP sitting:   128 / 84  (left arm) Cuff size:   large  Vitals Entered By: Levon Hedger (February 12, 2010 11:14 AM) CC: hospital followup [facility]...pt state she has a cardiologist now, Hypertension Management, Lipid Management Is Patient Diabetic? No Pain Assessment Patient in pain? yes     Location: hands, knees, foot Type: burning  Does patient need assistance? Functional Status Self care Ambulation Normal, Impaired:Risk for fall   Primary Care Provider:  [facility]ins  CC:  hospital followup [facility]...pt state she has a cardiologist now, Hypertension Management, and Lipid Management.  History of Present Illness:  Pt into the office for hospital D/C Admitted from 01/27/2010 to 01/30/2010  Dx: Presyncope/near syncope - Placed on tele - no events.  Carotid dopplers, head CT negative, transthoracic echo - calcified mass in the LV apex and transesophageal echo was done to verify the results.  Palpitations - no further episodes and no events on tele  Atypical Chest pain - cardiology consulted.    Hypertension - pt instructed to resume meds  Hyperlipidemia - resume pravastatin  Cardiology - stress test done on February 05, 2010 by Dr. Lynnea Ferrier at [facility].  Knee pain - even prior this hospitalization.  She has had right arthroscopy.  She needs total knee on both. Carpal tunnel - presents today with both braces in place.  She will need surgical intervention and is being followed by Dr. Montez Morita   Hypertension History:      She denies headache, chest pain, and palpitations.  She notes no problems with any antihypertensive medication side effects.        Positive major cardiovascular risk  factors include female age 57 years old or older, hyperlipidemia, and hypertension.  Negative major cardiovascular risk factors include no history of diabetes, negative family history for ischemic heart disease, and non-tobacco-user status.        Further assessment for target organ damage reveals no history of ASHD, cardiac end-organ damage (CHF/LVH), stroke/TIA, peripheral vascular disease, renal insufficiency, or hypertensive retinopathy.    Lipid Management History:      Positive NCEP/ATP III risk factors include female age 64 years old or older and hypertension.  Negative NCEP/ATP III risk factors include no history of early menopause without estrogen hormone replacement, non-diabetic, no family history for ischemic heart disease, non-tobacco-user status, no ASHD (atherosclerotic heart disease), no prior stroke/TIA, no peripheral vascular disease, and no history of aortic aneurysm.        The patient states that she does not know about the "Therapeutic Lifestyle Change" diet.  The patient does not know about adjunctive measures for cholesterol lowering.  She expresses no side effects from her lipid-lowering medication.  Comments include: pt is taking meds as ordered.  The patient denies any symptoms to suggest myopathy or liver disease.      Allergies: 1)  ! Penicillin 2)  ! Codeine 3)  ! * Latex  Review of Systems CV:  Complains of palpitations. Resp:  Denies cough. GI:  Complains of indigestion; denies abdominal pain, nausea, and vomiting. MS:  Complains of joint pain; bil knee pain.  Physical Exam  General:  alert.  obese Head:  normocephalic.   Lungs:  normal breath sounds.   Heart:  normal rate and regular rhythm.   Msk:  splints on both hands up to the exam table Neurologic:  cane use   Impression & Recommendations:  Problem # 1:  HYPERTENSION (ICD-401.9) s/p syncopal episode for which she required hospitalization stress test done.  pt has cardiac f/u next week BP  stable Her updated medication list for this problem includes:    Diovan Hct 80-12.5 Mg Tabs (Valsartan-hydrochlorothiazide) ..... One tablet by mouth daily for blood pressure    Lopressor 50 Mg Tabs (Metoprolol tartrate) ..... One tablet by mouth daily for blood pressure  Orders: Split Night (Split Night)  Problem # 2:  OBESITY (ICD-278.00) pt drastically needs to lose weight. will order sleep study to see if she is apnec Orders: Split Night (Split Night)  Problem # 3:  KNEE PAIN, BILATERAL (ICD-719.46) pt to f/u with ortho Her updated medication list for this problem includes:    Celebrex 200 Mg Caps (Celecoxib) ..... One capsule by mouth two times a day    Ultram 50 Mg Tabs (Tramadol hcl) .Marland Kitchen... 2 tablets by mouth daily as needed for pain  Complete Medication List: 1)  Diovan Hct 80-12.5 Mg Tabs (Valsartan-hydrochlorothiazide) .... One tablet by mouth daily for blood pressure 2)  Lopressor 50 Mg Tabs (Metoprolol tartrate) .... One tablet by mouth daily for blood pressure 3)  Pravastatin Sodium 20 Mg Tabs (Pravastatin sodium) .... One by mouth each day  for cholesterol 4)  Loratadine 10 Mg Tabs (Loratadine) .Marland Kitchen.. 1 tablet by mouth daily as needed for allergies 5)  Celebrex 200 Mg Caps (Celecoxib) .... One capsule by mouth two times a day 6)  Loratadine 10 Mg Tabs (Loratadine) .... One tablet by mouth daily for allergies 7)  Nystatin-triamcinolone 100000-0.1 Unit/gm-% Oint (Nystatin-triamcinolone) .... Apply to affected area two times a day until healed 8)  Ultram 50 Mg Tabs (Tramadol hcl) .... 2 tablets by mouth daily as needed for pain  Other Orders: Dexa scan (Dexa scan)  Hypertension Assessment/Plan:      The patient's hypertensive risk group is category B: At least one risk factor (excluding diabetes) with no target organ damage.  Her calculated 10 year risk of coronary heart disease is 8 %.  Today's blood pressure is 128/84.  Her blood pressure goal is < 140/90.  Lipid  Assessment/Plan:      Based on NCEP/ATP III, the patient's risk factor category is "0-1 risk factors".  The patient's lipid goals are as follows: Total cholesterol goal is 200; LDL cholesterol goal is 130; HDL cholesterol goal is 40; Triglyceride goal is 150.  Her LDL cholesterol goal has not been met.    Patient Instructions: 1)  Keep your appointment with cardiology 2)  You will be scheduled for a sleep study - you will be called of the time/date of this appointment 3)  You will be scheduled a bone density to check to see if you have soft bones 4)  Follow up in 6 weeks with n.martin,fnp Prescriptions: ULTRAM 50 MG TABS (TRAMADOL HCL) 2 tablets by mouth daily as needed for pain  #50 x 0   Entered and Authorized by:   Lehman Prom FNP   Signed by:   Lehman Prom FNP on 02/12/2010   Method used:   Electronically to        [id number] (retail)       [address]       [place], [place]  [address]       Ph: [phone]       Fax: [phone]   RxID:   [id number]    CXR  Procedure date:  01/27/2010  Findings:      pulmonary vascular congestion and possible mild interstitial edema  CT Brain  Procedure date:  01/27/2010  Findings:      No acute intracranial abnormality  Echocardiogram  Procedure date:  01/27/2010  Findings:      transthoracic - moderate sized calcified mass in the LV apex of uncertain etiology.  This may represent calcified LV false tendon chordae tendineae or papillary muscle.  Systolic function was normal.  EF 55-60% Wall motion normal.  MISC. Report  Procedure date:  01/28/2010  Findings:      no hemodynamically significant ICA stenosis noted bilaterally.  Vertebral artery flow is antegrade   CXR  Procedure date:  01/27/2010  Findings:      pulmonary vascular congestion and possible mild interstitial edema  CT Brain  Procedure date:  01/27/2010  Findings:      No acute intracranial  abnormality  Echocardiogram  Procedure date:  01/27/2010  Findings:      transthoracic - moderate sized calcified mass in the LV apex of uncertain etiology.  This may represent calcified LV false tendon chordae tendineae or papillary muscle.  Systolic function was normal.  EF 55-60% Wall motion normal.  MISC. Report  Procedure date:  01/28/2010  Findings:      no hemodynamically significant ICA stenosis noted bilaterally.  Vertebral artery flow is antegrade

## 2010-12-22 NOTE — Letter (Signed)
Summary: TEST ORDER FORM//MAMMOGRAM//APPT DATE & TIME  TEST ORDER FORM//MAMMOGRAM//APPT DATE & TIME   Imported By: Arta Bruce 12/05/2009 12:42:01  _____________________________________________________________________  External Attachment:    Type:   Image     Comment:   External Document

## 2010-12-22 NOTE — Letter (Signed)
Summary: SLEEP STUDY REFERRAL  SLEEP STUDY REFERRAL   Imported By: Arta Bruce 03/03/2010 09:19:05  _____________________________________________________________________  External Attachment:    Type:   Image     Comment:   External Document

## 2010-12-22 NOTE — Medication Information (Signed)
Summary: RX Folder//[facility]//APPROVED  RX Folder//[facility]//APPROVED   Imported By: Arta Bruce 06/09/2010 11:54:54  _____________________________________________________________________  External Attachment:    Type:   Image     Comment:   External Document

## 2010-12-22 NOTE — Assessment & Plan Note (Signed)
Summary: HTN/Joint pain   Vital Signs:  Patient profile:   55 year old female Menstrual status:  postmenopausal Weight:      323.1 pounds BMI:     49.30 BSA:     2.51 Temp:     98.0 degrees F oral Pulse rate:   76 / minute Pulse rhythm:   regular Resp:     20 per minute BP sitting:   183 / 107  (left arm) Cuff size:   large  Vitals Entered By: Levon Hedger (Apr 10, 2010 10:20 AM) CC: follow-up visit from visit with Dr. Lynnea Ferrier heart doctor...sleep study results...in alot of pain from head to feet...wake up in the morning and fingers are crooked, Hypertension Management Pain Assessment Patient in pain? yes     Location: head to feet  Does patient need assistance? Functional Status Self care Ambulation Normal Comments pt is not taking Metoprolol was taken away from heart Dr.    [facility] Care Provider:  [facility]  CC:  follow-up visit from visit with Dr. Lynnea Ferrier heart doctor...sleep study results...in alot of pain from head to feet...wake up in the morning and fingers are crooked and Hypertension Management.  History of Present Illness:  Pt into the office for 6 week f/u.  1.  Sleep study - Mild Sleep apnea no need for c-pap, advised positional changes instead Pt does try to sleep on her sides if at all possible but due to multiple joint problems this is not always achievable  2.  HTN - Metoprolol stopped by cardiology due to side effects and fatigue. She has noticed that she she has been off the medication she is not as tired during the day.  3.  Joint pain - stopped celebrex again by cardiology due to possible GI upset. Pain in joints has been so extreme that she did take one of the celebrex and GI symptoms returned and also included shortness of breath.  Celebrex was effective for pain but GI side effects made her stop. took arthrotec in the past and tolerated well  4.  Allergic Rhinitis - previously on loratadine 10mg  by mouth for allergies but has completed that  supply of meds +sneezing -throat irritation +sinus drainage  Hypertension History:      She denies chest pain and palpitations.  Pt has been taking diovan 80/12.5mg  - 2 tablets by mouth daily.  She did NOT take medications yet today .        Positive major cardiovascular risk factors include female age 23 years old or older, hyperlipidemia, and hypertension.  Negative major cardiovascular risk factors include no history of diabetes, negative family history for ischemic heart disease, and non-tobacco-user status.        Further assessment for target organ damage reveals no history of ASHD, cardiac end-organ damage (CHF/LVH), stroke/TIA, peripheral vascular disease, renal insufficiency, or hypertensive retinopathy.     Habits & Providers  Alcohol-Tobacco-Diet     Alcohol drinks/day: 0     Tobacco Status: never     Passive Smoke Exposure: yes  Exercise-Depression-Behavior     Does Patient Exercise: no     Drug Use: no     Seat Belt Use: 50     Sun Exposure: occasionally  Allergies: 1)  ! Penicillin 2)  ! Codeine 3)  ! Percocet 4)  ! * Latex  Review of Systems General:  Denies fatigue; has improved since stopping metoprolol. MS:  Complains of joint pain; left shoulder - ongoing problem.  difficult to get up  because she is not able to use her shoulders to push up. left hand - carpal tunnel - brace in place.  unable to use the cane as she can't grasp it.  Physical Exam  General:  obese Head:  normocephalic.   Ears:  bil ears with moderate cerumem impaction Msk:  up to the exam table general slowness Neurologic:  gait - rolling walker Skin:  left - splint in place Psych:  Oriented X3.     Impression & Recommendations:  Problem # 1:  HYPERTENSION (ICD-401.9) BP  is very elevated Pt has not taken meds yet today will have her return for BP check in 2 weeks Her updated medication list for this problem includes:    Diovan Hct 160-25 Mg Tabs (Valsartan-hydrochlorothiazide)  ..... One tablet by mouth daily  Problem # 2:  OBESITY (ICD-278.00) ongoing. activity is limited due to multiple joint issues  Problem # 3:  HYPERLIPIDEMIA (ICD-272.4) will refill cholesterol meds Her updated medication list for this problem includes:    Pravastatin Sodium 20 Mg Tabs (Pravastatin sodium) ..... One tablet by mouth nightly for cholesterol  Problem # 4:  ALLERGIC RHINITIS (ICD-477.9)  will refill meds Her updated medication list for this problem includes:    Loratadine 10 Mg Tabs (Loratadine) ..... One tablet by mouth daily for allergies  Problem # 5:  CERUMEN IMPACTION, BILATERAL (ICD-380.4)  irrigation done today in office  Orders: Cerumen Impaction Removal ([address])  Problem # 6:  SLEEP APNEA, OBSTRUCTIVE, MILD (ICD-327.23) results reviewed with pt  Problem # 7:  KNEE PAIN, BILATERAL (ICD-719.46) will give arthrotec samples and assess on f/u visit handicap placcard approved and form completed The following medications were removed from the medication list:    Celebrex 200 Mg Caps (Celecoxib) ..... One capsule by mouth two times a day Her updated medication list for this problem includes:    Ultram 50 Mg Tabs (Tramadol hcl) .Marland Kitchen... 2 tablets by mouth daily as needed for pain    Aspirin Ec Low Strength 81 Mg Tbec (Aspirin) ..... One tablet by mouth daily    Arthrotec 75-200 Mg-mcg Tabs (Diclofenac-misoprostol) ..... One tablet by mouth two times a day for joints  Complete Medication List: 1)  Diovan Hct 160-25 Mg Tabs (Valsartan-hydrochlorothiazide) .... One tablet by mouth daily 2)  Pravastatin Sodium 20 Mg Tabs (Pravastatin sodium) .... One tablet by mouth nightly for cholesterol 3)  Loratadine 10 Mg Tabs (Loratadine) .... One tablet by mouth daily for allergies 4)  Nystatin-triamcinolone 100000-0.1 Unit/gm-% Oint (Nystatin-triamcinolone) .... Apply to affected area two times a day until healed 5)  Ultram 50 Mg Tabs (Tramadol hcl) .... 2 tablets by mouth daily  as needed for pain 6)  Aspirin Ec Low Strength 81 Mg Tbec (Aspirin) .... One tablet by mouth daily 7)  Arthrotec 75-200 Mg-mcg Tabs (Diclofenac-misoprostol) .... One tablet by mouth two times a day for joints  Hypertension Assessment/Plan:      The patient's hypertensive risk group is category B: At least one risk factor (excluding diabetes) with no target organ damage.  Her calculated 10 year risk of coronary heart disease is 13 %.  Today's blood pressure is 183/107.  Her blood pressure goal is < 140/90.  Patient Instructions: 1)  Blood pressure - elevated today 2)  Take diovan 80/12.5mg  - 2 tablets by mouth daily. 3)  Joint pain - start arthrotec twice daily. Be sure you eat food THEN take the medications 4)  Aspirin - Get an ENTERIC coated aspirin, this  would be better for your stomach 5)  Follow up in 2 weeks for nurse visit - blood pressure check 6)  If ok will need new prescription for diovan 7)  Also ask about arthrotec - if ok will need a prescription Prescriptions: ARTHROTEC 75-200 MG-MCG TABS (DICLOFENAC-MISOPROSTOL) One tablet by mouth two times a day for joints  #10 x 0   Entered and Authorized by:   Lehman Prom FNP   Signed by:   Lehman Prom FNP on 04/10/2010   Method used:   Samples Given   RxID:   [id number] PRAVASTATIN SODIUM 20 MG  TABS (PRAVASTATIN SODIUM) One tablet by mouth nightly for cholesterol  #30 x 5   Entered and Authorized by:   Lehman Prom FNP   Signed by:   Lehman Prom FNP on 04/10/2010   Method used:   Faxed to ...       [facility] (retail)       [address] [place], [place]  [address]       Ph: [phone]       Fax: [phone]   RxID:   [id number] LORATADINE 10 MG TABS (LORATADINE) One tablet by mouth daily for allergies  #30 x 5   Entered and Authorized by:   Lehman Prom FNP   Signed by:   Lehman Prom FNP on 04/10/2010   Method used:   Faxed to ...        [facility] (retail)       [address] [place], [place]  [address]       Ph: [phone]       Fax: [phone]   RxID:   [id number]

## 2010-12-22 NOTE — Letter (Signed)
Summary: REFERRAL//[facility]  REFERRAL//[facility]   Imported By: Arta Bruce 02/18/2010 10:07:00  _____________________________________________________________________  External Attachment:    Type:   Image     Comment:   External Document

## 2010-12-22 NOTE — Assessment & Plan Note (Signed)
Summary: HTN   Vital Signs:  Patient profile:   55 year old female Menstrual status:  postmenopausal Weight:      323.3 pounds BMI:     49.34 Temp:     98.0 degrees F oral Pulse rate:   88 / minute Pulse rhythm:   regular Resp:     20 per minute BP sitting:   140 / 90  (left arm) Cuff size:   large  Vitals Entered By: Levon Hedger (June 12, 2010 11:52 AM) CC: follow-up visit after knee surgery....referral to ortho, Hypertension Management, Lipid Management, Abdominal Pain Is Patient Diabetic? No  Does patient need assistance? Functional Status Self care Ambulation Normal  Vision Screening:      Vision Comments: 05/2011   Primary Care Provider:  [facility]  CC:  follow-up visit after knee surgery....referral to ortho, Hypertension Management, Lipid Management, and Abdominal Pain.  History of Present Illness:  Pt into the office for f/u to surgery Right knee arthroscopy on 05/28/2010 by Dr. Montez Morita. She did go to f/u 1 week ago. She will return on next week for suture removal on 06/15/2010 Pt presents today with her walker which she uses all the time. She will start physical therapy on next week. She is still pending surgery on left knee and shoulders  Bil hands with tingling and pt is also seeing Dr. Montez Morita for that as well. Nerve conduction studies done. She does wear braces at night  Cardiology - Dr. Lynnea Ferrier - next visit on 06/19/2010  Dentist - Dr. Llana Aliment; pt has 5 teeth that need extraction and she has been referred to the oral surgeon  Diabetes Management History:      She says that she is not exercising regularly.    Dyspepsia History:      There is a prior history of GERD.  The patient does not have a prior history of documented ulcer disease.  The dominant symptom is heartburn or acid reflux.  An H-2 blocker medication is not currently being taken.  She has no history of a positive H. Pylori serology.  No previous upper endoscopy has been done.     Hypertension History:      She denies headache, chest pain, and palpitations.  She notes no problems with any antihypertensive medication side effects.  Pt has been out of her blood pressure medication but this office just recently completed prior authorization for her diovan.        Positive major cardiovascular risk factors include female age 54 years old or older, hyperlipidemia, and hypertension.  Negative major cardiovascular risk factors include no history of diabetes, negative family history for ischemic heart disease, and non-tobacco-user status.        Further assessment for target organ damage reveals no history of ASHD, cardiac end-organ damage (CHF/LVH), stroke/TIA, peripheral vascular disease, renal insufficiency, or hypertensive retinopathy.    Lipid Management History:      Positive NCEP/ATP III risk factors include female age 36 years old or older and hypertension.  Negative NCEP/ATP III risk factors include no history of early menopause without estrogen hormone replacement, non-diabetic, no family history for ischemic heart disease, non-tobacco-user status, no ASHD (atherosclerotic heart disease), no prior stroke/TIA, no peripheral vascular disease, and no history of aortic aneurysm.        The patient states that she does not know about the "Therapeutic Lifestyle Change" diet.  The patient does not know about adjunctive measures for cholesterol lowering.  She expresses no side effects  from her lipid-lowering medication.  The patient denies any symptoms to suggest myopathy or liver disease.      Habits & Providers  Alcohol-Tobacco-Diet     Alcohol drinks/day: 0     Tobacco Status: never     Passive Smoke Exposure: yes  Exercise-Depression-Behavior     Does Patient Exercise: no     Have you felt down or hopeless? no     Have you felt little pleasure in things? no     Depression Counseling: not indicated; screening negative for depression     Drug Use: no     Seat Belt Use:  50     Sun Exposure: occasionally  Allergies (verified): 1)  ! Penicillin 2)  ! Codeine 3)  ! Percocet 4)  ! * Latex                                                          Review of Systems General:  Denies fever. CV:  Denies chest pain or discomfort. Resp:  Denies cough. GI:  Denies abdominal pain, nausea, and vomiting. MS:  Complains of joint pain; all over. Neuro:  Complains of tingling; bilateral hands.  Physical Exam  General:  alert and overweight-appearing.   Head:  normocephalic.   Lungs:  normal breath sounds.   Heart:  normal rate and regular rhythm.   Neurologic:  walker  Skin:  right knee - minimal swelling,  s/p arthroscopy sutures in place and well healed Psych:  Oriented X3.    Diabetes Management Exam:    Eye Exam:       Eye Exam done elsewhere          Date: 06/04/2010          Results: early cataracts          Done by: Dr. Bunnie Pion   Impression & Recommendations:  Problem # 1:  KNEE PAIN, BILATERAL (ICD-719.46) advised pt to get f/u with ortho pt is doing well The following medications were removed from the medication list:    Ultram 50 Mg Tabs (Tramadol hcl) .Marland Kitchen... 2 tablets by mouth daily as needed for pain Her updated medication list for this problem includes:    Aspirin Ec Low Strength 81 Mg Tbec (Aspirin) ..... One tablet by mouth daily    Arthrotec 75-200 Mg-mcg Tabs (Diclofenac-misoprostol) ..... One tablet by mouth two times a day for joints    Tramadol-acetaminophen 37.5-325 Mg Tabs (Tramadol-acetaminophen) .Marland Kitchen... Take 1-2 tablets by mouth every 4 hours as needed *arthur francis carter  Orders: Orthopedic Referral (Ortho)  Problem # 2:  HYPERTENSION (ICD-401.9) Bp slightly elevated but pt has been without her meds for a few days pt informed that prior approval obtained and pt has already picked up med today Her updated medication list for this problem includes:    Diovan Hct 160-25 Mg Tabs (Valsartan-hydrochlorothiazide) ..... One  tablet by mouth daily  Problem # 3:  GERD (ICD-530.81) advised pt to eat before taking pain meds Her updated medication list for this problem includes:    Nexium 40 Mg Cpdr (Esomeprazole magnesium) ..... One tablet by mouth daily before breakfast for her stomach  Complete Medication List: 1)  Diovan Hct 160-25 Mg Tabs (Valsartan-hydrochlorothiazide) .... One tablet by mouth daily 2)  Pravastatin Sodium 20 Mg Tabs (Pravastatin sodium) .Marland KitchenMarland KitchenMarland Kitchen  One tablet by mouth nightly for cholesterol 3)  Loratadine 10 Mg Tabs (Loratadine) .... One tablet by mouth daily for allergies 4)  Nystatin-triamcinolone 100000-0.1 Unit/gm-% Oint (Nystatin-triamcinolone) .... Apply to affected area two times a day until healed 5)  Aspirin Ec Low Strength 81 Mg Tbec (Aspirin) .... One tablet by mouth daily 6)  Arthrotec 75-200 Mg-mcg Tabs (Diclofenac-misoprostol) .... One tablet by mouth two times a day for joints 7)  Restasis 0.05 % Emul (Cyclosporine) 8)  Tramadol-acetaminophen 37.5-325 Mg Tabs (Tramadol-acetaminophen) .... Take 1-2 tablets by mouth every 4 hours as needed *arthur francis carter 9)  Nexium 40 Mg Cpdr (Esomeprazole magnesium) .... One tablet by mouth daily before breakfast for her stomach  Diabetes Management Assessment/Plan:      The following lipid goals have been established for the patient: Total cholesterol goal of 200; LDL cholesterol goal of 130; HDL cholesterol goal of 40; Triglyceride goal of 150.  Her blood pressure goal is < 140/90.    Hypertension Assessment/Plan:      The patient's hypertensive risk group is category B: At least one risk factor (excluding diabetes) with no target organ damage.  Her calculated 10 year risk of coronary heart disease is 11 %.  Today's blood pressure is 140/90.  Her blood pressure goal is < 140/90.  Lipid Assessment/Plan:      Based on NCEP/ATP III, the patient's risk factor category is "2 or more risk factors and a calculated 10 year CAD risk of < 20%".  The  patient's lipid goals are as follows: Total cholesterol goal is 200; LDL cholesterol goal is 130; HDL cholesterol goal is 40; Triglyceride goal is 150.  Her LDL cholesterol goal has not been met.     Patient Instructions: 1)  Keep all your appointments with specialists as ordered 2)  Acid reflux - start nexium 40mg  by mouth before breakfast. This will help ease your stomach since you are taking the pain medications. 3)  keep this office updated with your care at the specialist. Prescriptions: NYSTATIN-TRIAMCINOLONE 100000-0.1 UNIT/GM-% OINT (NYSTATIN-TRIAMCINOLONE) apply to affected area two times a day until healed  #30gm x 1   Entered and Authorized by:   Lehman Prom FNP   Signed by:   Lehman Prom FNP on 06/12/2010   Method used:   Print then Give to Patient   RxID:   [id number] NEXIUM 40 MG CPDR (ESOMEPRAZOLE MAGNESIUM) One tablet by mouth daily before breakfast for her stomach  #30 x 5   Entered and Authorized by:   Lehman Prom FNP   Signed by:   Lehman Prom FNP on 06/12/2010   Method used:   Print then Give to Patient   RxID:   [id number]

## 2010-12-22 NOTE — Letter (Signed)
Summary: Lipid Letter  [facility]  [address] [place], [place] [address]   Phone: [phone]  Fax: [phone]    09/28/2010  Melonie Florida [address] [place], [place]  [address]  Dear Vernona Rieger:  We have carefully reviewed your last lipid profile from 09/25/2010 and the results are noted below with a summary of recommendations for lipid management.    Cholesterol:       162     Goal: less than 200   HDL "good" Cholesterol:   50     Goal: greater than 40   LDL "bad" Cholesterol:   94     Goal: less than 100   Triglycerides:       90     Goal: less than 150    labs done during recent office visit are normal.     Current Medications: 1)    Diovan Hct 160-25 Mg Tabs (Valsartan-hydrochlorothiazide) .... One tablet by mouth daily 2)    Pravastatin Sodium 20 Mg  Tabs (Pravastatin sodium) .... One tablet by mouth nightly for cholesterol 3)    Loratadine 10 Mg Tabs (Loratadine) .... One tablet by mouth daily for allergies 4)    Nystatin-triamcinolone 100000-0.1 Unit/gm-% Oint (Nystatin-triamcinolone) .... Apply to affected area two times a day until healed 5)    Aspirin Ec Low Strength 81 Mg Tbec (Aspirin) .... One tablet by mouth daily 6)    Arthrotec 75-200 Mg-mcg Tabs (Diclofenac-misoprostol) .... Hold 7)    Restasis 0.05 % Emul (Cyclosporine) 8)    Nexium 40 Mg Cpdr (Esomeprazole magnesium) .... One tablet by mouth daily before breakfast for her stomach 9)    Prednisone 5 Mg Tabs (Prednisone) .... One tablet by mouth daily  rx by ortho 10)    Etodolac Cr 400 Mg Xr24h-tab (Etodolac) .... One tablet by mouth two times a day  **rx by ortho**  If you have any questions, please call. We appreciate being able to work with you.   Sincerely,    [facility] Lehman Prom FNP

## 2010-12-22 NOTE — Miscellaneous (Signed)
Summary: Med change  Clinical Lists Changes Med change per cardiology Medications: Removed medication of LOPRESSOR 50 MG TABS (METOPROLOL TARTRATE) One tablet by mouth daily for blood pressure Changed medication from DIOVAN HCT 80-12.5 MG TABS (VALSARTAN-HYDROCHLOROTHIAZIDE) One tablet by mouth daily for blood pressure to DIOVAN HCT 160-25 MG TABS (VALSARTAN-HYDROCHLOROTHIAZIDE) One tablet by mouth daily

## 2010-12-22 NOTE — Letter (Signed)
Summary: handicapped placard  handicapped placard   Imported By: Arta Bruce 04/13/2010 09:53:38  _____________________________________________________________________  External Attachment:    Type:   Image     Comment:   External Document

## 2010-12-22 NOTE — Letter (Signed)
Summary: TEST ORDER FORM//MAMMOGRAM  TEST ORDER FORM//MAMMOGRAM   Imported By: Arta Bruce 09/25/2010 15:01:49  _____________________________________________________________________  External Attachment:    Type:   Image     Comment:   External Document

## 2010-12-22 NOTE — Assessment & Plan Note (Signed)
Summary: PER NURSE/ BLOOD PRESSURE CHECK/GK  Nurse Visit   Vital Signs:  Patient Profile:   55 Years Old Female BP sitting:   150 / 96  (left arm)  Vitals Entered By: Vesta Mixer CMA (August 02, 2007 4:48 PM)                Pt scheduled to see Jesse Fall 08/03/07 ..................................................................Marland KitchenTiffany McCoy CMA  August 02, 2007 5:01 PM   Prior Medications: METOPROLOL TARTRATE 50 MG TABS (METOPROLOL TARTRATE) 1 by mouth two times a day Current Allergies: ! PENICILLIN  Appended Document: PER NURSE/ BLOOD PRESSURE CHECK/GK    Clinical Lists Changes  Orders: Added new Service order of Est. Patient Level I (56433) - Signed

## 2010-12-22 NOTE — Assessment & Plan Note (Signed)
Summary: HTN   Vital Signs:  Patient profile:   55 year old female Menstrual status:  postmenopausal Weight:      325.5 pounds BMI:     49.67 Pulse rate:   84 / minute Pulse rhythm:   regular Resp:     20 per minute BP sitting:   136 / 70  (left arm) Cuff size:   large  Vitals Entered By: Levon Hedger (September 25, 2010 9:27 AM)  Nutrition Counseling: Patient's BMI is greater than 25 and therefore counseled on weight management options. CC: follow-up visit, Hypertension Management, Abdominal Pain Is Patient Diabetic? No Pain Assessment Patient in pain? no       Does patient need assistance? Functional Status Self care Ambulation Normal   Primary Care Provider:  [facility]  CC:  follow-up visit, Hypertension Management, and Abdominal Pain.  History of Present Illness:  Pt into the office for 2 month f/u S/p left knee arthroscopy since last visit done by Dr. Montez Morita. Pt has not been healing as expected due to back and sciatica. Pt is currently on prednisone She has not started physical therapy  Pt presents today with ALL her medications  Pt is fasting today for labs.  Obesity - up 6 pounds since last visit. pt attributes that to prednisone Admits that she is trying to walk more since she has had arthroscopy to both knees  Asthma History    Initial Asthma Severity Rating:    Age range: 12+ years    Symptoms: 0-2 days/week    Nighttime Awakenings: 0-2/month    Interferes w/ normal activity: no limitations    SABA use (not for EIB): 0-2 days/week    Exacerbations requiring oral systemic steroids: 0-1/year    Asthma Severity Assessment: Intermittent  Dyspepsia History:      She has no alarm features of dyspepsia including no history of melena, hematochezia, dysphagia, persistent vomiting, or involuntary weight loss > 5%.  There is a prior history of GERD.  The patient does not have a prior history of documented ulcer disease.  The dominant symptom is not  heartburn or acid reflux.  An H-2 blocker medication is not currently being taken.    Hypertension History:      She denies headache, chest pain, and palpitations.  She notes no problems with any antihypertensive medication side effects.        Positive major cardiovascular risk factors include female age 5 years old or older, hyperlipidemia, and hypertension.  Negative major cardiovascular risk factors include no history of diabetes, negative family history for ischemic heart disease, and non-tobacco-user status.        Further assessment for target organ damage reveals no history of ASHD, cardiac end-organ damage (CHF/LVH), stroke/TIA, peripheral vascular disease, renal insufficiency, or hypertensive retinopathy.       Habits & Providers  Alcohol-Tobacco-Diet     Alcohol drinks/day: 0     Tobacco Status: never  Exercise-Depression-Behavior     Does Patient Exercise: no     Have you felt down or hopeless? no     Have you felt little pleasure in things? no     Depression Counseling: not indicated; screening negative for depression     Drug Use: no     Seat Belt Use: 50     Sun Exposure: occasionally  Allergies (verified): 1)  ! Penicillin 2)  ! Codeine 3)  ! Percocet 4)  ! * Latex  Past History:  Past Surgical History: Head CT -  11/07 s/p right knee arthroscopy (1999).Dr.Arthur Montez Morita Left knee arthroscopy 07/2010 Right knee arthroscopy 05/2010  Review of Systems General:  Denies fever. CV:  Denies chest pain or discomfort. Resp:  Denies cough. GI:  Denies abdominal pain, nausea, and vomiting. MS:  Complains of low back pain; left knee is actually doing well.  Marland Kitchen  Physical Exam  General:  alert.  obese Head:  normocephalic.   Eyes:  glasses Lungs:  normal breath sounds.   Heart:  normal rate and regular rhythm.   Abdomen:  normal bowel sounds.   Neurologic:  alert & oriented X3.   cane use   Impression & Recommendations:  Problem # 1:  HYPERTENSION  (ICD-401.9) BP stable today  reviewed DASH diet continue current meds Her updated medication list for this problem includes:    Diovan Hct 160-25 Mg Tabs (Valsartan-hydrochlorothiazide) ..... One tablet by mouth daily  Orders: T-Comprehensive Metabolic Panel ([id number])  Problem # 2:  OBESITY (ICD-278.00) weight up advised pt to continue to increase activity monitor diet Orders: T-TSH ([id number])  Problem # 3:  NEED PROPHYLACTIC VACCINATION&INOCULATION FLU (ICD-V04.81) given today  Problem # 4:  GERD (ICD-530.81) symptoms stable advised pt that anti-inflammatories potentiate GI problems advised her not to take toradol and arthrotec Her updated medication list for this problem includes:    Nexium 40 Mg Cpdr (Esomeprazole magnesium) ..... One tablet by mouth daily before breakfast for her stomach  Orders: T-CBC w/Diff ([id number])  Problem # 5:  UNSPECIFIED BREAST SCREENING (ICD-V76.10) will schedule - last done 10/21/2010 Orders: Mammogram (Screening) (Mammo)  Problem # 6:  ASTHMA (ICD-493.90) symtoms stable Her updated medication list for this problem includes:    Prednisone 5 Mg Tabs (Prednisone) ..... One tablet by mouth daily  rx by ortho  Problem # 7:  KNEE PAIN, BILATERAL (ICD-719.46) continue to f/u with ortho as scheduled Her updated medication list for this problem includes:    Aspirin Ec Low Strength 81 Mg Tbec (Aspirin) ..... One tablet by mouth daily    Arthrotec 75-200 Mg-mcg Tabs (Diclofenac-misoprostol) ..... Hold    Etodolac Cr 400 Mg Xr24h-tab (Etodolac) ..... One tablet by mouth two times a day  **rx by ortho**  Complete Medication List: 1)  Diovan Hct 160-25 Mg Tabs (Valsartan-hydrochlorothiazide) .... One tablet by mouth daily 2)  Pravastatin Sodium 20 Mg Tabs (Pravastatin sodium) .... One tablet by mouth nightly for cholesterol 3)  Loratadine 10 Mg Tabs (Loratadine) .... One tablet by mouth daily for allergies 4)  Nystatin-triamcinolone  100000-0.1 Unit/gm-% Oint (Nystatin-triamcinolone) .... Apply to affected area two times a day until healed 5)  Aspirin Ec Low Strength 81 Mg Tbec (Aspirin) .... One tablet by mouth daily 6)  Arthrotec 75-200 Mg-mcg Tabs (Diclofenac-misoprostol) .... Hold 7)  Restasis 0.05 % Emul (Cyclosporine) 8)  Nexium 40 Mg Cpdr (Esomeprazole magnesium) .... One tablet by mouth daily before breakfast for her stomach 9)  Prednisone 5 Mg Tabs (Prednisone) .... One tablet by mouth daily  rx by ortho 10)  Etodolac Cr 400 Mg Xr24h-tab (Etodolac) .... One tablet by mouth two times a day  **rx by ortho**  Other Orders: T-Lipid Profile ([id number])  Hypertension Assessment/Plan:      The patient's hypertensive risk group is category B: At least one risk factor (excluding diabetes) with no target organ damage.  Her calculated 10 year risk of coronary heart disease is 8 %.  Today's blood pressure is 136/70.  Her blood pressure goal is < 140/90.  Patient Instructions: 1)  Blood pressure is doing well. 2)  You have received the flu vaccine today. 3)  Keep your appointment for mammogram as scheduled 4)  You will notified of your lab results 5)  Weight - Continue on your efforts to increase exercise and monitor diet 6)  Follow up in 3 months for high blood pressure   Orders Added: 1)  Est. Patient Level IV [04540] 2)  T-Lipid Profile [80061-22930] 3)  T-Comprehensive Metabolic Panel [[id number]] 4)  T-CBC w/Diff [[id number]] 5)  T-TSH [95621-30865] 6)  Mammogram (Screening) [Mammo]    Prevention & Chronic Care Immunizations   Influenza vaccine: Fluvax 3+  (09/18/2009)    Tetanus booster: 10/15/2009: Tdap    Pneumococcal vaccine: Not documented  Colorectal Screening   Hemoccult: Not documented   Hemoccult action/deferral: Ordered  (12/15/2009)   Hemoccult due: 12/15/2010    Colonoscopy: hemhorroids/Normal/Sam Granby  (09/04/2008)  Other Screening   Pap smear:  Specimen Adequacy: Satisfactory  for evaluation.   Interpretation/Result:Negative for intraepithelial Lesion or Malignancy.     (12/15/2009)   Pap smear action/deferral: Ordered  (12/15/2009)   Pap smear due: 12/2010    Mammogram: ASSESSMENT: Negative - BI-RADS 1^MM DIGITAL SCREENING  (10/21/2009)   Mammogram action/deferral: Ordered  (09/18/2009)   Mammogram due: 10/21/2010   Smoking status: never  (09/25/2010)  Lipids   Total Cholesterol: 162  (12/15/2009)   Lipid panel action/deferral: Lipid Panel ordered   LDL: 99  (12/15/2009)   LDL Direct: Not documented   HDL: 44  (12/15/2009)   Triglycerides: 93  (12/15/2009)   Lipid panel due: 12/19/2009    SGOT (AST): 20  (12/15/2009)   BMP action: Ordered   SGPT (ALT): 16  (12/15/2009) CMP ordered    Alkaline phosphatase: 41  (12/15/2009)   Total bilirubin: 0.6  (12/15/2009)  Hypertension   Last Blood Pressure: 136 / 70  (09/25/2010)   Serum creatinine: 0.73  (09/18/2009)   BMP action: Ordered   Serum potassium 4.5  (09/18/2009) CMP ordered    Basic metabolic panel due: 04/13/2010  Self-Management Support :   Personal Goals (by the next clinic visit) :      Personal blood pressure goal: 130/80  (12/15/2009)     Personal LDL goal: 100  (12/15/2009)    Hypertension self-management support: Not documented    Lipid self-management support: Not documented    Nursing Instructions: Give Flu vaccine today    Appended Document: HTN     Allergies: 1)  ! Penicillin 2)  ! Codeine 3)  ! Percocet 4)  ! * Latex   Complete Medication List: 1)  Diovan Hct 160-25 Mg Tabs (Valsartan-hydrochlorothiazide) .... One tablet by mouth daily 2)  Pravastatin Sodium 20 Mg Tabs (Pravastatin sodium) .... One tablet by mouth nightly for cholesterol 3)  Loratadine 10 Mg Tabs (Loratadine) .... One tablet by mouth daily for allergies 4)  Nystatin-triamcinolone 100000-0.1 Unit/gm-% Oint (Nystatin-triamcinolone) .... Apply to affected area two times a day until healed 5)   Aspirin Ec Low Strength 81 Mg Tbec (Aspirin) .... One tablet by mouth daily 6)  Arthrotec 75-200 Mg-mcg Tabs (Diclofenac-misoprostol) .... Hold 7)  Restasis 0.05 % Emul (Cyclosporine) 8)  Nexium 40 Mg Cpdr (Esomeprazole magnesium) .... One tablet by mouth daily before breakfast for her stomach 9)  Prednisone 5 Mg Tabs (Prednisone) .... One tablet by mouth daily  rx by ortho 10)  Etodolac Cr 400 Mg Xr24h-tab (Etodolac) .... One tablet by mouth two times a day  **rx by ortho**  Other Orders:  Flu Vaccine 79yrs + [phone]) Admin 1st Vaccine (60454)   Orders Added: 1)  Flu Vaccine 86yrs + [09811] 2)  Admin 1st Vaccine [90471]   Immunizations Administered:  Influenza Vaccine # 1:    Vaccine Type: Fluvax 3+    Site: right deltoid    Mfr: GlaxoSmithKline    Dose: 0.5 ml    Route: IM    Given by: Levon Hedger    Exp. Date: 05/22/2011    Lot #: [id number]    VIS given: 06/16/10 version given September 25, 2010.  Flu Vaccine Consent Questions:    Do you have a history of severe allergic reactions to this vaccine? no    Any prior history of allergic reactions to egg and/or gelatin? no    Do you have a sensitivity to the preservative Thimersol? no    Do you have a past history of Guillan-Barre Syndrome? no    Do you currently have an acute febrile illness? no    Have you ever had a severe reaction to latex? no    Vaccine information given and explained to patient? yes    Are you currently pregnant? no    ndc  [phone]  Immunizations Administered:  Influenza Vaccine # 1:    Vaccine Type: Fluvax 3+    Site: right deltoid    Mfr: GlaxoSmithKline    Dose: 0.5 ml    Route: IM    Given by: Levon Hedger    Exp. Date: 05/22/2011    Lot #: [id number]    VIS given: 06/16/10 version given September 25, 2010.

## 2010-12-22 NOTE — Assessment & Plan Note (Signed)
Summary: Blood Pressure Check  Nurse Visit   Vital Signs:  Patient profile:   55 year old female Menstrual status:  postmenopausal Pulse rate:   93 / minute Pulse rhythm:   regular Resp:     20 per minute BP sitting:   113 / 76  (left arm) Cuff size:   large  Vitals Entered By: Dutch Quint RN (April 24, 2010 3:46 PM)  Patient Instructions: 1)  Advised to follow-up with provider in three months.  Drink plenty of fluids, monitor changes in balance.  Rx faxed to [facility].  CC: bp check  Does patient need assistance? Functional Status Self care Ambulation Impaired:Risk for fall Comments states occasionally off-balance, not all the time. Denies no dizziness, visual changes or headaches.  "It was bad a couple of months ago."  Unable to stand in place, feels like her back will give out.  States Arthrotec working well for her, just makes her thirsty.  Jesse Fall, FNP made aware of BP and discussed new Rx with pt.     Allergies: 1)  ! Penicillin 2)  ! Codeine 3)  ! Percocet 4)  ! * Latex  Orders Added: 1)  Est. Patient Level I [25956]   Impression & Recommendations:  Problem # 1:  HYPERTENSION (ICD-401.9) Pt is tolerating BP meds well continue current dose Her updated medication list for this problem includes:    Diovan Hct 160-25 Mg Tabs (Valsartan-hydrochlorothiazide) ..... One tablet by mouth daily  Orders: Est. Patient Level I (38756)  Problem # 2:  KNEE PAIN, BILATERAL (ICD-719.46) pt is tolerating arthrotec well will send rx to pharmacy Her updated medication list for this problem includes:    Ultram 50 Mg Tabs (Tramadol hcl) .Marland Kitchen... 2 tablets by mouth daily as needed for pain    Aspirin Ec Low Strength 81 Mg Tbec (Aspirin) ..... One tablet by mouth daily    Arthrotec 75-200 Mg-mcg Tabs (Diclofenac-misoprostol) ..... One tablet by mouth two times a day for joints  Complete Medication List: 1)  Diovan Hct 160-25 Mg Tabs (Valsartan-hydrochlorothiazide) .... One  tablet by mouth daily 2)  Pravastatin Sodium 20 Mg Tabs (Pravastatin sodium) .... One tablet by mouth nightly for cholesterol 3)  Loratadine 10 Mg Tabs (Loratadine) .... One tablet by mouth daily for allergies 4)  Nystatin-triamcinolone 100000-0.1 Unit/gm-% Oint (Nystatin-triamcinolone) .... Apply to affected area two times a day until healed 5)  Ultram 50 Mg Tabs (Tramadol hcl) .... 2 tablets by mouth daily as needed for pain 6)  Aspirin Ec Low Strength 81 Mg Tbec (Aspirin) .... One tablet by mouth daily 7)  Arthrotec 75-200 Mg-mcg Tabs (Diclofenac-misoprostol) .... One tablet by mouth two times a day for joints  Prescriptions: ARTHROTEC 75-200 MG-MCG TABS (DICLOFENAC-MISOPROSTOL) One tablet by mouth two times a day for joints  #60 x 5   Entered by:   Dutch Quint RN   Authorized by:   Lehman Prom FNP   Signed by:   Dutch Quint RN on 04/24/2010   Method used:   Faxed to ...       [facility] (retail)       [address] [place], [place]  [address]       Ph: [phone]       Fax: [phone]   RxID:   [id number]

## 2010-12-22 NOTE — Letter (Signed)
Summary: SLEEP STUDY//APPT /PT AWARE  SLEEP STUDY//APPT /PT AWARE   Imported By: Arta Bruce 05/07/2010 15:41:20  _____________________________________________________________________  External Attachment:    Type:   Image     Comment:   External Document

## 2010-12-22 NOTE — Progress Notes (Signed)
Summary: Office Visit/DEPRESSION SCREENING  Office Visit/DEPRESSION SCREENING   Imported By: Arta Bruce 02/06/2010 15:03:41  _____________________________________________________________________  External Attachment:    Type:   Image     Comment:   External Document

## 2010-12-22 NOTE — Miscellaneous (Signed)
Summary: Approval - Diovan HCT 160-24 mg. x1 year.  06/05/2010 - 06/05/2011  Clinical Lists Changes  Medications: Changed medication from DIOVAN HCT 160-25 MG TABS (VALSARTAN-HYDROCHLOROTHIAZIDE) One tablet by mouth daily to DIOVAN HCT 160-25 MG TABS (VALSARTAN-HYDROCHLOROTHIAZIDE) One tablet by mouth daily

## 2010-12-22 NOTE — Letter (Signed)
Summary: [facility]   Imported By: Arta Bruce 05/04/2010 15:32:35  _____________________________________________________________________  External Attachment:    Type:   Image     Comment:   External Document

## 2010-12-22 NOTE — Letter (Signed)
Summary[facility]   Imported By: Arta Bruce 05/21/2010 12:14:00  _____________________________________________________________________  External Attachment:    Type:   Image     Comment:   External Document

## 2010-12-22 NOTE — Assessment & Plan Note (Signed)
Summary: Complete Physical Exam   Vital Signs:  Patient profile:   55 year old female Menstrual status:  postmenopausal Height:      68 inches Weight:      317.5 pounds BMI:     48.45 BSA:     2.49 Temp:     98.0 degrees F oral Pulse rate:   76 / minute Pulse rhythm:   regular Resp:     18 per minute BP sitting:   127 / 80  (left arm) Cuff size:   large  Vitals Entered By: Arthor Captain (December 15, 2009 9:10 AM)  Nutrition Counseling: Patient's BMI is greater than 25 and therefore counseled on weight management options. CC: CPP, Lipid Management, Hypertension Management Is Patient Diabetic? No Pain Assessment Patient in pain? yes     Location: joints Intensity: 7 Type: aching Onset of pain  Chronic  Does patient need assistance? Functional Status Self care Ambulation Normal Comments complains of heavy rectal bleeding, some spotting in between periods of bleeding, bright nred blood, HXof internal hemmorhoid  Pt. assesses joint pain to be at an 8 without Celebrex and at about a 4 when she takes it     Menstrual Status postmenopausal   Primary Care Provider:  Rankins  CC:  CPP, Lipid Management, and Hypertension Management.  History of Present Illness:  Pt into the office for a complete physical exam  PAP -  over 1 year ago.  All previous PAP Smears have been ok. Menses - post menopausal since age 27.  Mammogram - last done 10/21/2009 with good results.  Tdap - given 10/15/2009.  Optho - wears glasses but she has lost them.  No recent eye exam.  She does see Dr. Mitzi Davenport.  Dental - " i need to see the dentist"  S/p broken teeth in mouth.  no infection, pain or tenderness at this time but she recognizes that they need full extraction  Obesity - she has been on a fast for the past 21 days.  She has been eating more fruit and veges, chicken, poultry tuna. Acknowledges the need to lose weight  Social -  no meaningful activity during the day. Lives in a 1  bedroom apartment    Hypertension History:      She denies headache, chest pain, and palpitations.  She notes no problems with any antihypertensive medication side effects.        Positive major cardiovascular risk factors include female age 34 years old or older, hyperlipidemia, and hypertension.  Negative major cardiovascular risk factors include no history of diabetes, negative family history for ischemic heart disease, and non-tobacco-user status.        Further assessment for target organ damage reveals no history of ASHD, cardiac end-organ damage (CHF/LVH), stroke/TIA, peripheral vascular disease, renal insufficiency, or hypertensive retinopathy.    Lipid Management History:      Positive NCEP/ATP III risk factors include female age 65 years old or older and hypertension.  Negative NCEP/ATP III risk factors include no history of early menopause without estrogen hormone replacement, non-diabetic, no family history for ischemic heart disease, non-tobacco-user status, no ASHD (atherosclerotic heart disease), no prior stroke/TIA, no peripheral vascular disease, and no history of aortic aneurysm.        The patient states that she does not know about the "Therapeutic Lifestyle Change" diet.  The patient does not know about adjunctive measures for cholesterol lowering.  She expresses no side effects from her lipid-lowering medication.  The patient denies  any symptoms to suggest myopathy or liver disease.     Habits & Providers  Alcohol-Tobacco-Diet     Alcohol drinks/day: 0     Tobacco Status: never     Passive Smoke Exposure: yes  Exercise-Depression-Behavior     Does Patient Exercise: no     Drug Use: no     Seat Belt Use: 50     Sun Exposure: occasionally  Comments: PHQ-9 score = 16  Allergies (verified): 1)  ! Penicillin 2)  ! Codeine 3)  ! * Latex  Review of Systems General:  Complains of fatigue; denies fever. Eyes:  Complains of blurring. ENT:  Complains of postnasal  drainage; denies earache. CV:  Denies chest pain or discomfort. Resp:  Denies cough. GI:  Denies abdominal pain, nausea, and vomiting. GU:  Denies dysuria. MS:  Complains of joint pain and low back pain; takes celebrex as needed and reports that pain gets better but if she does not have anything to do then she won't take the meds. Derm:  Denies rash. Neuro:  Denies headaches. Psych:  Denies depression.  Physical Exam  General:  alert.  obese Head:  normocephalic.   Eyes:  pupils equal and pupils round.   Ears:  bil TM with bony landmarks present no erythema Nose:  no nasal discharge.   Mouth:  pharynx pink and moist and poor dentition.   mulitple caries and broken teeth Neck:  supple.   Chest Wall:  no mass.   Breasts:  large pendulous breast no masses noted right breast - above nipple, approx 1cm circumscribed scaly lesion Lungs:  normal breath sounds.   Heart:  normal rate and regular rhythm.   Abdomen:  soft, non-tender, and normal bowel sounds.  obese Rectal:  external hemorrhoid(s).   guaiac negative Msk:  slow, up to exam table Pulses:  R radial normal, R dorsalis pedis normal, L radial normal, and L dorsalis pedis normal.   Extremities:  no edema Neurologic:  cane use Skin:  left scapula - skin tag multiple nevi to neck and chest Psych:  Oriented X3.    Pelvic Exam  Vulva:      normal appearance.   Urethra and Bladder:      Urethra--normal.   Vagina:      physiologic discharge.  cystocele Grade 1 Cervix:      midposition.   Adnexa:      nontender bilaterally.   Rectum:      heme negative stool.      Impression & Recommendations:  Problem # 1:  ROUTINE GYNECOLOGICAL EXAMINATION (ICD-V72.31) PAP done  mammogram up to date tdap up to date rec optho and dental exam PHQ-9 score =- 16 Orders: Hemoccult Guaiac-1 spec.(in office) (82270) UA Dipstick w/o Micro (manual) (34742) T- GC Chlamydia (59563) KOH/ WET Mount [id number]) Pap Smear, Thin Prep (  Collection of) (P3295)  Problem # 2:  HYPERLIPIDEMIA (ICD-272.4) will check labs today  Her updated medication list for this problem includes:    Pravastatin Sodium 20 Mg Tabs (Pravastatin sodium) ..... One by mouth each day  for cholesterol  Orders: T-Lipid Profile (18841-66063) T-Hepatic Function [phone])  Problem # 3:  HYPERTENSION (ICD-401.9) stable DASH diet Her updated medication list for this problem includes:    Diovan Hct 80-12.5 Mg Tabs (Valsartan-hydrochlorothiazide) ..... One tablet by mouth daily for blood pressure    Lopressor 50 Mg Tabs (Metoprolol tartrate) ..... One tablet by mouth daily for blood pressure  Orders: T-Urine Microalbumin w/creat. ratio [phone])  EKG w/ Interpretation (93000) T-Lipid Profile ([address]-60454)  Problem # 4:  OBESITY (ICD-278.00) advised pt that she needs to monitor diet  Problem # 5:  EXTERNAL HEMORRHOIDS (ICD-455.3) handout given needs to increase fiber in the diet  Problem # 6:  HEMATURIA UNSPECIFIED (ICD-599.70) will send urine for culture The following medications were removed from the medication list:    Clarithromycin 500 Mg Tabs (Clarithromycin) ..... One tablet by mouth two times a day for infection  Orders: T-Culture, Urine ([id number])  Complete Medication List: 1)  Diovan Hct 80-12.5 Mg Tabs (Valsartan-hydrochlorothiazide) .... One tablet by mouth daily for blood pressure 2)  Lopressor 50 Mg Tabs (Metoprolol tartrate) .... One tablet by mouth daily for blood pressure 3)  Pravastatin Sodium 20 Mg Tabs (Pravastatin sodium) .... One by mouth each day  for cholesterol 4)  Loratadine 10 Mg Tabs (Loratadine) .Marland Kitchen.. 1 tablet by mouth daily as needed for allergies 5)  Celebrex 200 Mg Caps (Celecoxib) .... One capsule by mouth two times a day 6)  Loratadine 10 Mg Tabs (Loratadine) .... One tablet by mouth daily for allergies 7)  Nystatin-triamcinolone 100000-0.1 Unit/gm-% Oint (Nystatin-triamcinolone) .... Apply  to affected area two times a day until healed  Hypertension Assessment/Plan:      The patient's hypertensive risk group is category B: At least one risk factor (excluding diabetes) with no target organ damage.  Her calculated 10 year risk of coronary heart disease is 9 %.  Today's blood pressure is 127/80.  Her blood pressure goal is < 140/90.  Lipid Assessment/Plan:      Based on NCEP/ATP III, the patient's risk factor category is "2 or more risk factors and a calculated 10 year CAD risk of < 20%".  The patient's lipid goals are as follows: Total cholesterol goal is 200; LDL cholesterol goal is 130; HDL cholesterol goal is 40; Triglyceride goal is 150.  Her LDL cholesterol goal has not been met.     Patient Instructions: 1)  Your cholesterol will be checked and you will be informed of the results.  2)  Your blood pressure is good today. 3)  Dental Clinic - not taking referrals at this time.  You will have to recheck back with this office to see when they will add new patients to the list 4)  Breast - use ointment to area on right breast until healed. 5)  Follow up in 6 months or sooner if necessary. Prescriptions: NYSTATIN-TRIAMCINOLONE 100000-0.1 UNIT/GM-% OINT (NYSTATIN-TRIAMCINOLONE) apply to affected area two times a day until healed  #30gm x 0   Entered and Authorized by:   Lehman Prom FNP   Signed by:   Lehman Prom FNP on 12/15/2009   Method used:   Faxed to ...       [facility] (retail)       [address] [place], [place]  [address]       Ph: [phone]       Fax: [phone]   RxID:   [id number]   Laboratory Results   Urine Tests  Date/Time Received: December 15, 2009 9:26 AM  Date/Time Reported: December 15, 2009 9:26 AM   Routine Urinalysis   Color: Dorrance Sellick Appearance: Clear Glucose: negative   (Normal Range: Negative) Bilirubin: small   (Normal Range: Negative) Ketone: negative   (Normal Range:  Negative) Spec. Gravity: >=1.030   (Normal Range: 1.003-1.035) Blood: moderate   (Normal Range: Negative) pH: 5.0   (Normal  Range: 5.0-8.0) Protein: 30   (Normal Range: Negative) Urobilinogen: 1.0   (Normal Range: 0-1) Nitrite: negative   (Normal Range: Negative) Leukocyte Esterace: negative   (Normal Range: Negative)    Date/Time Received: December 15, 2009 10:34 AM   Wet Mount/KOH Source: vaginal WBC/hpf: 1-5 Bacteria/hpf: rare Clue cells/hpf: none Yeast/hpf: none Trichomonas/hpf: none  Stool - Occult Blood Hemmoccult #1: negative Date: 12/15/2009   Prevention & Chronic Care Immunizations   Influenza vaccine: Fluvax 3+  (09/18/2009)    Tetanus booster: 10/15/2009: Tdap    Pneumococcal vaccine: Not documented  Colorectal Screening   Hemoccult: Not documented   Hemoccult action/deferral: Ordered  (12/15/2009)   Hemoccult due: 12/15/2010    Colonoscopy: hemhorroids/Normal/Sam Rantoul  (09/04/2008)  Other Screening   Pap smear: Not documented   Pap smear action/deferral: Ordered  (12/15/2009)   Pap smear due: 12/15/2010    Mammogram: ASSESSMENT: Negative - BI-RADS 1^MM DIGITAL SCREENING  (10/21/2009)   Mammogram action/deferral: Ordered  (09/18/2009)   Mammogram due: 10/21/2010   Smoking status: never  (12/15/2009)  Lipids   Total Cholesterol: 202  (09/18/2009)   Lipid panel action/deferral: Lipid Panel ordered   LDL: 131  (09/18/2009)   LDL Direct: Not documented   HDL: 49  (09/18/2009)   Triglycerides: 108  (09/18/2009)   Lipid panel due: 12/19/2009    SGOT (AST): 18  (09/18/2009)   BMP action: Ordered   SGPT (ALT): 12  (09/18/2009)   Alkaline phosphatase: 42  (09/18/2009)   Total bilirubin: 0.5  (09/18/2009)  Hypertension   Last Blood Pressure: 127 / 80  (12/15/2009)   Serum creatinine: 0.73  (09/18/2009)   BMP action: Ordered   Serum potassium 4.5  (09/18/2009)   Basic metabolic panel due: 04/13/2010    Hypertension flowsheet reviewed?:  Yes   Progress toward BP goal: At goal  Self-Management Support :   Personal Goals (by the next clinic visit) :      Personal blood pressure goal: 130/80  (12/15/2009)     Personal LDL goal: 100  (12/15/2009)    Patient will work on the following items until the next clinic visit to reach self-care goals:     Medications and monitoring: take my medicines every day, check my blood pressure, bring all of my medications to every visit  (12/15/2009)     Eating: eat foods that are low in salt  (12/15/2009)     Activity: take a 30 minute walk every day  (12/15/2009)    Hypertension self-management support: Not documented    Lipid self-management support: Not documented     EKG  Procedure date:  12/15/2009  Findings:      normal:  74   Laboratory Results   Urine Tests    Routine Urinalysis   Color: Cordella Nyquist Appearance: Clear Glucose: negative   (Normal Range: Negative) Bilirubin: small   (Normal Range: Negative) Ketone: negative   (Normal Range: Negative) Spec. Gravity: >=1.030   (Normal Range: 1.003-1.035) Blood: moderate   (Normal Range: Negative) pH: 5.0   (Normal Range: 5.0-8.0) Protein: 30   (Normal Range: Negative) Urobilinogen: 1.0   (Normal Range: 0-1) Nitrite: negative   (Normal Range: Negative) Leukocyte Esterace: negative   (Normal Range: Negative)      Wet Mount Wet Mount KOH: Negative  Stool - Occult Blood Hemmoccult #1: negative

## 2010-12-22 NOTE — Letter (Signed)
Summary: TEST ORDER FORM/DEXA SCAN  TEST ORDER FORM/DEXA SCAN   Imported By: Arta Bruce 04/23/2010 15:46:35  _____________________________________________________________________  External Attachment:    Type:   Image     Comment:   External Document

## 2010-12-22 NOTE — Letter (Signed)
Summary: *HSN Results Follow up  [facility]  [address] [place], [place] [address]   Phone: [phone]  Fax: [phone]      12/17/2009   [name] [address] [place], [place]  [address]   Dear  [name],                            ____S.Drinkard,FNP   ____D. Gore,FNP       ____B. McPherson,MD   ____V. Rankins,MD    ____E. Mulberry,MD    _X___N. Daphine Deutscher, FNP  ____D. Reche Dixon, MD    ____K. Philipp Deputy, MD    ____Other     This letter is to inform you that your recent test(s):  ___X____Pap Smear    ____X___Lab Test     _______X-ray    __X_____ is within acceptable limits  _______ requires a medication change  _______ requires a follow-up lab visit  _______ requires a follow-up visit with your provider   Comments: Pap Smear and labs done during recent office visit are normal.       _________________________________________________________ If you have any questions, please contact our office [phone].                    Sincerely,    Lehman Prom FNP [facility]

## 2010-12-24 NOTE — Letter (Signed)
Summary: Lipid Letter  [facility]  [address] [place], [place] [address]   Phone: [phone]  Fax: [phone]    12/07/2010  [name] [address] [place], [place]  [address]  Dear [name]:  We have carefully reviewed your last lipid profile from 12/04/2010 and the results are noted below with a summary of recommendations for lipid management.    Cholesterol:       190     Goal: less than 200   HDL "good" Cholesterol:   51     Goal: greater than 40   LDL "bad" Cholesterol:   123     Goal: less than 130   Triglycerides:       80     Goal: less than 150  Your cholesterol looks great. It is still doing good without you being on medications.  Keep monitoring your diet.  Avoid fried and fatty foods.     Current Medications: 1)    Diovan Hct 160-25 Mg Tabs (Valsartan-hydrochlorothiazide) .... One tablet by mouth daily 2)    Pravastatin Sodium 20 Mg  Tabs (Pravastatin sodium) .... Hold 3)    Loratadine 10 Mg Tabs (Loratadine) .... One tablet by mouth daily for allergies 4)    Nystatin-triamcinolone 100000-0.1 Unit/gm-% Oint (Nystatin-triamcinolone) .... Apply to affected area two times a day until healed 5)    Aspirin Ec Low Strength 81 Mg Tbec (Aspirin) .... One tablet by mouth daily 6)    Restasis 0.05 % Emul (Cyclosporine) 7)    Nexium 40 Mg Cpdr (Esomeprazole magnesium) .... One tablet by mouth daily before breakfast for her stomach 8)    Voltaren 1 % Gel (Diclofenac sodium) .... Apply 4 gm topically to lower extremities four times per day 9)    Ventolin Hfa 108 (90 Base) Mcg/act Aers (Albuterol sulfate) .... 2 puffs every 6 hours as needed for shortness of breath 10)    Diovan 160 Mg Tabs (Valsartan) .... Take one (1) tablet by mouth daily  If you have any questions, please call. We appreciate being able to work with you.   Sincerely,    [facility] Lehman Prom FNP

## 2010-12-24 NOTE — Assessment & Plan Note (Signed)
Summary: F/u joint pain   Vital Signs:  Patient profile:   55 year old female Menstrual status:  postmenopausal Weight:      323.9 pounds BMI:     49.43 Temp:     97.8 degrees F oral Pulse rate:   88 / minute Pulse rhythm:   regular Resp:     20 per minute BP sitting:   150 / 98  (left arm) Cuff size:   large  Vitals Entered By: Levon Hedger (December 04, 2010 11:26 AM)  Nutrition Counseling: Patient's BMI is greater than 25 and therefore counseled on weight management options. CC: follow-up visit...cholesterol check...went to [facility] urgent care on 12/31, Lipid Management, Abdominal Pain, Hypertension Management Is Patient Diabetic? No Pain Assessment Patient in pain? yes     Location: knee Onset of pain  Chronic  Does patient need assistance? Functional Status Self care Ambulation Normal   Primary Care Provider:  Rankins  CC:  follow-up visit...cholesterol check...went to [facility] urgent care on 12/31, Lipid Management, Abdominal Pain, and Hypertension Management.  History of Present Illness:  Pt into the office for f/u for chronic illnesses.  Obesity - down 6 pounds since last visit Joint pain has improved so she is able to move around more  Ortho - pt had bilateral arthroscropy surgeries over the past year for her knees.  She needs total knee replacement but she reluctantly admits that she does not want anymore surgery at this time.  Admits to stiffness in the morining.   She finished her therapy sessions. She does still walk with a cane She has other areas that need to be addressed such as her shoulders and carpal tunnel. She has started to take tylenol as needed and she has stopped all prescription meds.  ER visit vrom 11/22/2010 reviewed  Dyspepsia History:      She has no alarm features of dyspepsia including no history of melena, hematochezia, dysphagia, persistent vomiting, or involuntary weight loss > 5%.  There is a prior history of GERD.  The patient does not have  a prior history of documented ulcer disease.  The dominant symptom is not heartburn or acid reflux.  An H-2 blocker medication is not currently being taken.  She has no history of a positive H. Pylori serology.    Hypertension History:      She denies headache, chest pain, and palpitations.  She notes no problems with any antihypertensive medication side effects.  Diovan was added in the afternoon to her regimen by cardiology - exact dose is unknown and pt did not bring meds into the office today.  Further comments include: Pt sees Dr. Lynnea Ferrier - cardiologist.        Positive major cardiovascular risk factors include female age 33 years old or older, hyperlipidemia, and hypertension.  Negative major cardiovascular risk factors include no history of diabetes, negative family history for ischemic heart disease, and non-tobacco-user status.        Further assessment for target organ damage reveals no history of ASHD, cardiac end-organ damage (CHF/LVH), stroke/TIA, peripheral vascular disease, renal insufficiency, or hypertensive retinopathy.    Lipid Management History:      Positive NCEP/ATP III risk factors include female age 46 years old or older and hypertension.  Negative NCEP/ATP III risk factors include no history of early menopause without estrogen hormone replacement, non-diabetic, no family history for ischemic heart disease, non-tobacco-user status, no ASHD (atherosclerotic heart disease), no prior stroke/TIA, no peripheral vascular disease, and no history of  aortic aneurysm.        The patient states that she does not know about the "Therapeutic Lifestyle Change" diet.  The patient does not know about adjunctive measures for cholesterol lowering.  Adjunctive measures started by the patient include weight reduction.  She notes side effects from her lipid-lowering medication.  Comments include: joint pain  - statin stopped during last visit.  The patient denies any symptoms to suggest myopathy or  liver disease.       Habits & Providers  Alcohol-Tobacco-Diet     Alcohol drinks/day: 0     Tobacco Status: never     Passive Smoke Exposure: yes  Exercise-Depression-Behavior     Does Patient Exercise: no     Depression Counseling: not indicated; screening negative for depression     Drug Use: no     Seat Belt Use: 50     Sun Exposure: occasionally  Comments: Pt has just purchased her water suit and she was given an application to [facility] during last visit.  She has plans to drop it off after she leaves the office today   Allergies (verified): 1)  ! Penicillin 2)  ! Codeine 3)  ! Percocet 4)  ! * Latex  Review of Systems CV:  Denies chest pain or discomfort. Resp:  Denies cough. GI:  Denies abdominal pain, nausea, and vomiting. MS:  Complains of joint pain. Neuro:  Complains of headaches; Pt went to the ER on 11/22/2010 - she was referred to [facility] in March.  She already has the appt.  Physical Exam  General:  alert.   Head:  normocephalic.   Ears:  ear piercing(s) noted.   Lungs:  normal breath sounds.   Heart:  normal rate and regular rhythm.   Abdomen:  normal bowel sounds.   Neurologic:  cane use Skin:  color normal.   Psych:  Oriented X3.     Impression & Recommendations:  Problem # 1:  HYPERLIPIDEMIA (ICD-272.4)  statin stopped during last visit due to joint pain will check cholesterol today since she has been off meds if still  ok then pt to use lifestyle changes Her updated medication list for this problem includes:    Pravastatin Sodium 20 Mg Tabs (Pravastatin sodium) ..... Hold  Orders: T-Lipid Profile (16109-60454)  Problem # 2:  OBESITY (ICD-278.00) down 6 pounds since last visit congrats to pt pt to start water aerobics  Problem # 3:  HYPERTENSION (ICD-401.9) No BP meds yet today due to fasting status pt to take meds as ordered Her updated medication list for this problem includes:    Diovan Hct 160-25 Mg Tabs  (Valsartan-hydrochlorothiazide) ..... One tablet by mouth daily    Diovan 160 Mg Tabs (Valsartan) .Marland Kitchen... Take one (1) tablet by mouth daily  Problem # 4:  KNEE PAIN, RIGHT, CHRONIC (ICD-719.46) pt to f/u with Dr. Montez Morita at ortho Her updated medication list for this problem includes:    Aspirin Ec Low Strength 81 Mg Tbec (Aspirin) ..... One tablet by mouth daily  Problem # 5:  GERD (ICD-530.81) stable at this time Her updated medication list for this problem includes:    Nexium 40 Mg Cpdr (Esomeprazole magnesium) ..... One tablet by mouth daily before breakfast for her stomach  Complete Medication List: 1)  Diovan Hct 160-25 Mg Tabs (Valsartan-hydrochlorothiazide) .... One tablet by mouth daily 2)  Pravastatin Sodium 20 Mg Tabs (Pravastatin sodium) .... Hold 3)  Loratadine 10 Mg Tabs (Loratadine) .... One tablet by mouth  daily for allergies 4)  Nystatin-triamcinolone 100000-0.1 Unit/gm-% Oint (Nystatin-triamcinolone) .... Apply to affected area two times a day until healed 5)  Aspirin Ec Low Strength 81 Mg Tbec (Aspirin) .... One tablet by mouth daily 6)  Restasis 0.05 % Emul (Cyclosporine) 7)  Nexium 40 Mg Cpdr (Esomeprazole magnesium) .... One tablet by mouth daily before breakfast for her stomach 8)  Voltaren 1 % Gel (Diclofenac sodium) .... Apply 4 gm topically to lower extremities four times per day 9)  Ventolin Hfa 108 (90 Base) Mcg/act Aers (Albuterol sulfate) .... 2 puffs every 6 hours as needed for shortness of breath 10)  Diovan 160 Mg Tabs (Valsartan) .... Take one (1) tablet by mouth daily  Hypertension Assessment/Plan:      The patient's hypertensive risk group is category B: At least one risk factor (excluding diabetes) with no target organ damage.  Her calculated 10 year risk of coronary heart disease is 8 %.  Today's blood pressure is 150/98.  Her blood pressure goal is < 140/90.  Lipid Assessment/Plan:      Based on NCEP/ATP III, the patient's risk factor category is "2 or  more risk factors and a calculated 10 year CAD risk of < 20%".  The patient's lipid goals are as follows: Total cholesterol goal is 200; LDL cholesterol goal is 130; HDL cholesterol goal is 40; Triglyceride goal is 150.  Her LDL cholesterol goal has not been met.     Patient Instructions: 1)  You look great today. 2)  You are down 6 pounds since the last visit. 3)  Keep using the cane as needed  4)  Submit your application for water aerobics and this will be help with your joints. 5)  Your cholesterol will be checked today.  Hopefully it will be normal. If so then no need to start medications.  If elevated then will need to find another cholesterol medications.  You should still monitor diet and eat low fat and baked foods. 6)  Call for a follow up in 2 months for blood pressure.   Orders Added: 1)  Est. Patient Level IV [13244] 2)  T-Lipid Profile [[id number]]     CT Scan  Procedure date:  11/22/2010  Findings:      5 mm focus increased density at the foramen of Monro.  This is similar back to 10/14/2006.  Favor tiny colloid cyst.  Miminal chorois could look similar but is felt less likely. Otherwise no acute intracranial abnormality   CT Scan  Procedure date:  11/22/2010  Findings:      5 mm focus increased density at the foramen of Monro.  This is similar back to 10/14/2006.  Favor tiny colloid cyst.  Miminal chorois could look similar but is felt less likely. Otherwise no acute intracranial abnormality

## 2010-12-24 NOTE — Assessment & Plan Note (Signed)
Summary: Joint pain   Vital Signs:  Patient profile:   55 year old female Menstrual status:  postmenopausal Weight:      329.6 pounds BMI:     50.30 O2 Sat:      96 % Temp:     97.1 degrees F oral Pulse rate:   90 / minute Pulse rhythm:   regular Resp:     20 per minute BP sitting:   140 / 84  (left arm) Cuff size:   large  Vitals Entered By: Levon Hedger (November 05, 2010 11:32 AM)  Nutrition Counseling: Patient's BMI is greater than 25 and therefore counseled on weight management options. CC: pt has SOB on yesterday and she is having pain in her waist area and below, joint pain...states all of her medication are making her sick and she is gagging, Hypertension Management, Lipid Management Is Patient Diabetic? No Pain Assessment Patient in pain? yes     Location: waist Intensity: 8  Does patient need assistance? Functional Status Self care Ambulation Normal   Primary Care Provider:  [facility]ins  CC:  pt has SOB on yesterday and she is having pain in her waist area and below, joint pain...states all of her medication are making her sick and she is gagging, Hypertension Management, and Lipid Management.  History of Present Illness:  Pt into the office for f/u on phone call made to this office for SOB Started on yesterday - sob "I woke up yesterday and was in severe pain all over my body" Some relief with hot shower in joints  pt presents today with all her medications.  She reports side effects from many of her pain medications - GI side effects.  Arthotec, etodolac, vicodin, flexeril.  Pt also stopped taking prednisone due to weight gain.  "When i don't take the medication then my insides feel good but my joints ache" Takes nexium daily as ordered.   Hypertension History:      She denies headache, chest pain, and palpitations.        Positive major cardiovascular risk factors include female age 50 years old or older, hyperlipidemia, and hypertension.  Negative major  cardiovascular risk factors include no history of diabetes, negative family history for ischemic heart disease, and non-tobacco-user status.        Further assessment for target organ damage reveals no history of ASHD, cardiac end-organ damage (CHF/LVH), stroke/TIA, peripheral vascular disease, renal insufficiency, or hypertensive retinopathy.    Lipid Management History:      Positive NCEP/ATP III risk factors include female age 14 years old or older and hypertension.  Negative NCEP/ATP III risk factors include no history of early menopause without estrogen hormone replacement, non-diabetic, no family history for ischemic heart disease, non-tobacco-user status, no ASHD (atherosclerotic heart disease), no prior stroke/TIA, no peripheral vascular disease, and no history of aortic aneurysm.        The patient states that she does not know about the "Therapeutic Lifestyle Change" diet.  She notes side effects from her lipid-lowering medication.  Comments include: joint pain.      Medications Prior to Update: 1)  Diovan Hct 160-25 Mg Tabs (Valsartan-Hydrochlorothiazide) .... One Tablet By Mouth Daily 2)  Pravastatin Sodium 20 Mg  Tabs (Pravastatin Sodium) .... One Tablet By Mouth Nightly For Cholesterol 3)  Loratadine 10 Mg Tabs (Loratadine) .... One Tablet By Mouth Daily For Allergies 4)  Nystatin-Triamcinolone 100000-0.1 Unit/gm-% Oint (Nystatin-Triamcinolone) .... Apply To Affected Area Two Times A Day Until Healed 5)  Aspirin Ec Low Strength 81 Mg Tbec (Aspirin) .... One Tablet By Mouth Daily 6)  Arthrotec 75-200 Mg-Mcg Tabs (Diclofenac-Misoprostol) .... Hold 7)  Restasis 0.05 % Emul (Cyclosporine) 8)  Nexium 40 Mg Cpdr (Esomeprazole Magnesium) .... One Tablet By Mouth Daily Before Breakfast For Her Stomach 9)  Prednisone 5 Mg Tabs (Prednisone) .... One Tablet By Mouth Daily  Rx By Ortho 10)  Etodolac Cr 400 Mg Xr24h-Tab (Etodolac) .... One Tablet By Mouth Two Times A Day  **rx By  Gaylord Shih**  Current Medications (verified): 1)  Diovan Hct 160-25 Mg Tabs (Valsartan-Hydrochlorothiazide) .... One Tablet By Mouth Daily 2)  Pravastatin Sodium 20 Mg  Tabs (Pravastatin Sodium) .... One Tablet By Mouth Nightly For Cholesterol 3)  Loratadine 10 Mg Tabs (Loratadine) .... One Tablet By Mouth Daily For Allergies 4)  Nystatin-Triamcinolone 100000-0.1 Unit/gm-% Oint (Nystatin-Triamcinolone) .... Apply To Affected Area Two Times A Day Until Healed 5)  Aspirin Ec Low Strength 81 Mg Tbec (Aspirin) .... One Tablet By Mouth Daily 6)  Arthrotec 75-200 Mg-Mcg Tabs (Diclofenac-Misoprostol) .... Hold 7)  Restasis 0.05 % Emul (Cyclosporine) 8)  Nexium 40 Mg Cpdr (Esomeprazole Magnesium) .... One Tablet By Mouth Daily Before Breakfast For Her Stomach 9)  Prednisone 5 Mg Tabs (Prednisone) .... One Tablet By Mouth Daily  Rx By Ortho 10)  Etodolac Cr 400 Mg Xr24h-Tab (Etodolac) .... One Tablet By Mouth Two Times A Day  **rx By Gaylord Shih**  Allergies (verified): 1)  ! Penicillin 2)  ! Codeine 3)  ! Percocet 4)  ! * Latex  Review of Systems General:  Denies fever. CV:  Denies chest pain or discomfort. Resp:  Denies cough. GI:  Denies abdominal pain, nausea, and vomiting. MS:  Complains of joint pain.  Physical Exam  General:  alert.  obese Head:  normocephalic.   Eyes:  glasses Ears:  ear piercing(s) noted.   Lungs:  normal breath sounds.   Heart:  normal rate and regular rhythm.   Abdomen:  midepigastric tenderness Msk:  decreased ROM.   Neurologic:  alert & oriented X3.   cane use Skin:  color normal.   Psych:  Oriented X3.     Impression & Recommendations:  Problem # 1:  HYPERTENSION (ICD-401.9) BP stable  continue current meds Her updated medication list for this problem includes:    Diovan Hct 160-25 Mg Tabs (Valsartan-hydrochlorothiazide) ..... One tablet by mouth daily  Problem # 2:  OBESITY (ICD-278.00) advised pt that she needs to be part of silver sneaker -  application given  Problem # 3:  ASTHMA (ICD-493.90) will refill inhaler  The following medications were removed from the medication list:    Prednisone 5 Mg Tabs (Prednisone) ..... One tablet by mouth daily  rx by ortho Her updated medication list for this problem includes:    Ventolin Hfa 108 (90 Base) Mcg/act Aers (Albuterol sulfate) .Marland Kitchen... 2 puffs every 6 hours as needed for shortness of breath  Problem # 4:  KNEE PAIN, BILATERAL (ICD-719.46)  The following medications were removed from the medication list:    Arthrotec 75-200 Mg-mcg Tabs (Diclofenac-misoprostol) ..... Hold    Etodolac Cr 400 Mg Xr24h-tab (Etodolac) ..... One tablet by mouth two times a day  **rx by ortho** Her updated medication list for this problem includes:    Aspirin Ec Low Strength 81 Mg Tbec (Aspirin) ..... One tablet by mouth daily  Orders: Depo- Medrol 80mg  (J1040) Ketorolac-Toradol 15mg  (Z6109) Admin of Therapeutic Inj  intramuscular or subcutaneous (60454)  Complete  Medication List: 1)  Diovan Hct 160-25 Mg Tabs (Valsartan-hydrochlorothiazide) .... One tablet by mouth daily 2)  Pravastatin Sodium 20 Mg Tabs (Pravastatin sodium) .... Hold 3)  Loratadine 10 Mg Tabs (Loratadine) .... One tablet by mouth daily for allergies 4)  Nystatin-triamcinolone 100000-0.1 Unit/gm-% Oint (Nystatin-triamcinolone) .... Apply to affected area two times a day until healed 5)  Aspirin Ec Low Strength 81 Mg Tbec (Aspirin) .... One tablet by mouth daily 6)  Restasis 0.05 % Emul (Cyclosporine) 7)  Nexium 40 Mg Cpdr (Esomeprazole magnesium) .... One tablet by mouth daily before breakfast for her stomach 8)  Voltaren 1 % Gel (Diclofenac sodium) .... Apply 4 gm topically to lower extremities four times per day 9)  Ventolin Hfa 108 (90 Base) Mcg/act Aers (Albuterol sulfate) .... 2 puffs every 6 hours as needed for shortness of breath  Hypertension Assessment/Plan:      The patient's hypertensive risk group is category B: At least  one risk factor (excluding diabetes) with no target organ damage.  Her calculated 10 year risk of coronary heart disease is 8 %.  Today's blood pressure is 140/84.  Her blood pressure goal is < 140/90.  Lipid Assessment/Plan:      Based on NCEP/ATP III, the patient's risk factor category is "2 or more risk factors and a calculated 10 year CAD risk of < 20%".  The patient's lipid goals are as follows: Total cholesterol goal is 200; LDL cholesterol goal is 130; HDL cholesterol goal is 40; Triglyceride goal is 150.  Her LDL cholesterol goal has not been met.    Patient Instructions: 1)  Hold your pravastatin until your next visit here.  If your joint pain improves then we will need to start another cholesterol medication 2)  Joint pain - start the anti-inflammatory cream to your knees and affected joints.   3)  You may take tylenol arthritis twice daily  4)  You will be given an injection of medications but this is only for one time.  This will not resolve the long term issue of pain.   5)  You need to join the [facility].  Read over the application.  Silver sneakers program will be great for you. 6)  Keep your appointment in January as previously scheduled Prescriptions: VOLTAREN 1 % GEL (DICLOFENAC SODIUM) apply 4 gm topically to lower extremities four times per day  #100gm x 1   Entered and Authorized by:   Lehman Prom FNP   Signed by:   Lehman Prom FNP on 11/05/2010   Method used:   Print then Give to Patient   RxID:   [id number] VENTOLIN HFA 108 (90 BASE) MCG/ACT AERS (ALBUTEROL SULFATE) 2 puffs every 6 hours as needed for shortness of breath  #1 x 1   Entered and Authorized by:   Lehman Prom FNP   Signed by:   Lehman Prom FNP on 11/05/2010   Method used:   Print then Give to Patient   RxID:   [id number]    Medication Administration  Injection # 1:    Medication: Depo- Medrol 80mg     Diagnosis: KNEE PAIN, BILATERAL (ICD-719.46)    Route: IM    Site: LUOQ  gluteus    Exp Date: 05/2011    Lot #: [id number]    Mfr: [employer]    Patient tolerated injection without complications    Given by: Levon Hedger (November 05, 2010 12:50 PM)  Injection # 2:    Medication: Ketorolac-Toradol 15mg     Diagnosis:  KNEE PAIN, BILATERAL (ICD-719.46)    Route: IM    Site: RUOQ gluteus    Exp Date: 08/2011    Lot #: [id number]    Mfr: workhardt    Patient tolerated injection without complications    Given by: Levon Hedger (November 05, 2010 12:50 PM)  Orders Added: 1)  Est. Patient Level III [99213] 2)  Depo- Medrol 80mg  [J1040] 3)  Ketorolac-Toradol 15mg  [J1885] 4)  Admin of Therapeutic Inj  intramuscular or subcutaneous [96372]       Medication Administration  Injection # 1:    Medication: Depo- Medrol 80mg     Diagnosis: KNEE PAIN, BILATERAL (ICD-719.46)    Route: IM    Site: LUOQ gluteus    Exp Date: 05/2011    Lot #: [id number]    Mfr: [employer]    Patient tolerated injection without complications    Given by: Levon Hedger (November 05, 2010 12:50 PM)  Injection # 2:    Medication: Ketorolac-Toradol 15mg     Diagnosis: KNEE PAIN, BILATERAL (ICD-719.46)    Route: IM    Site: RUOQ gluteus    Exp Date: 08/2011    Lot #: [id number]    Mfr: workhardt    Patient tolerated injection without complications    Given by: Levon Hedger (November 05, 2010 12:50 PM)  Orders Added: 1)  Est. Patient Level III [25366] 2)  Depo- Medrol 80mg  [J1040] 3)  Ketorolac-Toradol 15mg  [J1885] 4)  Admin of Therapeutic Inj  intramuscular or subcutaneous [44034]

## 2010-12-24 NOTE — Progress Notes (Signed)
Summary: NOT FEELING WELL AT ALL  Phone Note Call from Patient Call back at [phone]   Reason for Call: Talk to Nurse Summary of Call: [name] PT. MS Pacini STOPPED INI THIS MORNING SAYS THAT SHE DOES NOT FEEL WELL AND THAT EVERY SINGLE ONE OF HER MEDICINES IS CAUSING HER STOMACH TO CRAMP AND SHE FEELS LIKE SHE IS NOT GETTING ENOUGH AIR. THIS HAS BEEN GOING ON FOR THE LAST COUPLE OF DAYS. JOINTS HURT BAD AND CAN'T HARDLY WALK. Initial call taken by: Leodis Rains,  November 04, 2010 10:22 AM  Follow-up for Phone Call        States she is so stiff when she walks that she is becoming almost immobile.  Past four days has been in excruciating pain, all joints and both legs hurt.  Not taking any pain medication except Tylenol Arthritis because cardiologist told her to only take that for pain as other meds "aren't good for her."   Hurts to get up and hurts to sit down -- no position is comfortable.  Best relief is laying down.  States she has SOB at times, in no distress at present.  Also states that she broke out in an rash, closed, pimple spots "looks like eczema", started itching about 3 weeks ago -- was taking pain medication, thinks she had a cumulative reaction to everything she was taking.  Thinks prednisone is the cause- stopped taking abruptly about a month ago.  Called Dr. Montez Morita and he told her to stop what he had prescribed her for pain.  Ears and nose also itched.  Continues to itch intermittently.  Using nystatin-triamcinolone cream for the itch which is effective.  Started half-way on her right arm and has spread to her wrist.  Feels "bad" -- which is not normal for her.  States all of her medications are making her feel bad, except BP meds.  Has been having joint pain since taking pravastatin.  Is still taking BP meds, pravastatin, Nexium and tylenol only.  Also has sharp pains in her stomach and her head, states they are not headaches, have been long-standing.  Head pain is now spreading  to the rest of her body.  Needs something for pain, doesn't want something oral.  Advised she needs to be seen, no appointments available until 12/04/09.  Agreed to appointment made, will probably go to UC or ED today. Follow-up by: Dutch Quint RN,  November 04, 2010 11:11 AM  Additional Follow-up for Phone Call Additional follow up Details #1::        Lots going on - none of which can be handled via phone.  she can call back for availability in the schedule that may occur due to cancellations  Some of her complaints (itchy ears and arthritis) are not new so can be discussed at f/u. i am concerned about sob espeically given recent (within past 3 months) knee surgery.  She should have been doing PT but with any surgery and immobilization of a limb concern is with DVT Additional Follow-up by: Lehman Prom FNP,  November 04, 2010 11:45 AM    Additional Follow-up for Phone Call Additional follow up Details #2::    Never went to [facility] or ED.  Yesterday was very SOB, "but I'm hanging on" -- appt. scheduled for this morning with [name].  Dutch Quint RN  November 05, 2010 9:32 AM  In office to see [name].  Dutch Quint RN  November 05, 2010 11:47 AM

## 2010-12-24 NOTE — Letter (Signed)
Summary: [facility]  [facility]   Imported By: Arta Bruce 12/17/2010 10:13:43  _____________________________________________________________________  External Attachment:    Type:   Image     Comment:   External Document

## 2011-01-01 ENCOUNTER — Telehealth (INDEPENDENT_AMBULATORY_CARE_PROVIDER_SITE_OTHER): Payer: Self-pay | Admitting: Nurse Practitioner

## 2011-01-07 NOTE — Progress Notes (Signed)
Summary: Nystatin-triamcinolone cream  Phone Note Refill Request   Refills Requested: Medication #1:  NYSTATIN-TRIAMCINOLONE 100000-0.1 UNIT/GM-% OINT apply to affected area two times a day until healed   Last Refilled: 06/12/2010 Last filled 10/2010.  Itching all over, has rash on right arm, looks like eczema.  Also has itching and rash under breast, corners of lower abdomen in fold.  Had a little cream left, used it on affected areas and was effective in relieving itch and rashes.  Would like to obtain more.  Uses [facility] on [address].   Initial call taken by: Dutch Quint RN,  January 01, 2011 5:06 PM  Follow-up for Phone Call        will refill med per pt request I've seen pt for this before  Follow-up by: Lehman Prom FNP,  January 01, 2011 5:11 PM  Additional Follow-up for Phone Call Additional follow up Details #1::        Noted.  Pt. notified.  Dutch Quint RN  January 01, 2011 5:23 PM     Prescriptions: NYSTATIN-TRIAMCINOLONE 100000-0.1 UNIT/GM-% OINT (NYSTATIN-TRIAMCINOLONE) apply to affected area two times a day until healed  #30gm x 1   Entered and Authorized by:   Lehman Prom FNP   Signed by:   Lehman Prom FNP on 01/01/2011   Method used:   Electronically to        [facility] [phone]* (retail)       [address]       [place], [place]  [address]       Ph: [phone]       Fax: [phone]   RxID:   [id number]

## 2011-01-29 ENCOUNTER — Other Ambulatory Visit: Payer: Self-pay | Admitting: Neurology

## 2011-01-29 DIAGNOSIS — G43009 Migraine without aura, not intractable, without status migrainosus: Secondary | ICD-10-CM

## 2011-02-01 LAB — CBC
HCT: 39.6 % (ref 36.0–46.0)
Hemoglobin: 13.5 g/dL (ref 12.0–15.0)
MCH: 27.9 pg (ref 26.0–34.0)
MCHC: 34.1 g/dL (ref 30.0–36.0)
MCV: 81.8 fL (ref 78.0–100.0)

## 2011-02-01 LAB — URINALYSIS, ROUTINE W REFLEX MICROSCOPIC
Bilirubin Urine: NEGATIVE
Glucose, UA: NEGATIVE mg/dL
Ketones, ur: NEGATIVE mg/dL
pH: 6.5 (ref 5.0–8.0)

## 2011-02-01 LAB — COMPREHENSIVE METABOLIC PANEL
ALT: 22 U/L (ref 0–35)
BUN: 13 mg/dL (ref 6–23)
CO2: 29 mEq/L (ref 19–32)
Calcium: 9.5 mg/dL (ref 8.4–10.5)
GFR calc non Af Amer: >60 mL/min (ref >60)
Glucose, Bld: 139 mg/dL — ABNORMAL HIGH (ref 70–99)
Sodium: 138 mEq/L (ref 135–145)

## 2011-02-01 LAB — DIFFERENTIAL
Eosinophils Absolute: 0.2 10*3/uL (ref 0.0–0.7)
Eosinophils Relative: 2 % (ref 0–5)
Lymphocytes Relative: 35 % (ref 12–46)
Neutrophils Relative %: 57 % (ref 43–77)

## 2011-02-01 LAB — POCT CARDIAC MARKERS: Troponin i, poc: <0.05 ng/mL (ref 0.00–0.09)

## 2011-02-01 LAB — URINE MICROSCOPIC-ADD ON

## 2011-02-01 LAB — OCCULT BLOOD, POC DEVICE: Fecal Occult Bld: NEGATIVE

## 2011-02-04 ENCOUNTER — Ambulatory Visit
Admission: RE | Admit: 2011-02-04 | Discharge: 2011-02-04 | Disposition: A | Discharge status: Home / Self Care | Payer: Medicare Other | Source: Ambulatory Visit | Attending: Neurology | Admitting: Neurology

## 2011-02-04 DIAGNOSIS — G43009 Migraine without aura, not intractable, without status migrainosus: Secondary | ICD-10-CM

## 2011-02-04 LAB — DIFFERENTIAL
Eosinophils Relative: 2 % (ref 0–5)
Lymphocytes Relative: 26 % (ref 12–46)
Lymphs Abs: 2.5 10*3/uL (ref 0.7–4.0)
Monocytes Absolute: 0.5 10*3/uL (ref 0.1–1.0)

## 2011-02-04 LAB — COMPREHENSIVE METABOLIC PANEL
ALT: 29 U/L (ref 0–35)
AST: 27 U/L (ref 0–37)
Albumin: 3.7 g/dL (ref 3.5–5.2)
Alkaline Phosphatase: 51 U/L (ref 39–117)
BUN: 8 mg/dL (ref 6–23)
Chloride: 99 mEq/L (ref 96–112)
Potassium: 4 mEq/L (ref 3.5–5.1)
Sodium: 138 mEq/L (ref 135–145)
Total Bilirubin: 0.6 mg/dL (ref 0.3–1.2)

## 2011-02-04 LAB — CBC
HCT: 35 % — ABNORMAL LOW (ref 36.0–46.0)
MCV: 85.8 fL (ref 78.0–100.0)
MCV: 82.3 fL (ref 78.0–100.0)
Platelets: 232 10*3/uL (ref 150–400)
RBC: 4.08 MIL/uL (ref 3.87–5.11)
RBC: 4.74 MIL/uL (ref 3.87–5.11)
WBC: 9.9 10*3/uL (ref 4.0–10.5)
WBC: 8.2 10*3/uL (ref 4.0–10.5)

## 2011-02-04 LAB — URINALYSIS, ROUTINE W REFLEX MICROSCOPIC
Bilirubin Urine: NEGATIVE
Glucose, UA: NEGATIVE mg/dL
Ketones, ur: NEGATIVE mg/dL
pH: 7.5 (ref 5.0–8.0)

## 2011-02-04 LAB — BASIC METABOLIC PANEL
Chloride: 105 mEq/L (ref 96–112)
GFR calc Af Amer: >60 mL/min (ref >60)
Potassium: 3.4 mEq/L — ABNORMAL LOW (ref 3.5–5.1)

## 2011-02-04 LAB — SURGICAL PCR SCREEN: MRSA, PCR: NEGATIVE

## 2011-02-04 LAB — URINE MICROSCOPIC-ADD ON

## 2011-02-04 LAB — D-DIMER, QUANTITATIVE: D-Dimer, Quant: 0.4 ug/mL-FEU (ref 0.00–0.48)

## 2011-02-07 LAB — COMPREHENSIVE METABOLIC PANEL
Albumin: 3.8 g/dL (ref 3.5–5.2)
BUN: 18 mg/dL (ref 6–23)
Creatinine, Ser: 0.91 mg/dL (ref 0.4–1.2)
Total Protein: 8 g/dL (ref 6.0–8.3)

## 2011-02-07 LAB — DIFFERENTIAL
Lymphocytes Relative: 43 % (ref 12–46)
Lymphs Abs: 3 10*3/uL (ref 0.7–4.0)
Monocytes Absolute: 0.5 10*3/uL (ref 0.1–1.0)
Monocytes Relative: 7 % (ref 3–12)
Neutro Abs: 3.3 10*3/uL (ref 1.7–7.7)

## 2011-02-07 LAB — URINE MICROSCOPIC-ADD ON

## 2011-02-07 LAB — URINALYSIS, ROUTINE W REFLEX MICROSCOPIC
Glucose, UA: NEGATIVE mg/dL
Protein, ur: NEGATIVE mg/dL
pH: 5.5 (ref 5.0–8.0)

## 2011-02-07 LAB — CBC
MCH: 29 pg (ref 26.0–34.0)
MCV: 86.6 fL (ref 78.0–100.0)
Platelets: 237 10*3/uL (ref 150–400)
RDW: 15.3 % (ref 11.5–15.5)
WBC: 7 10*3/uL (ref 4.0–10.5)

## 2011-02-07 LAB — SURGICAL PCR SCREEN: Staphylococcus aureus: NEGATIVE

## 2011-02-12 LAB — CBC
Hemoglobin: 12.6 g/dL (ref 12.0–15.0)
MCHC: 32.9 g/dL (ref 30.0–36.0)
MCV: 86.8 fL (ref 78.0–100.0)
Platelets: 232 10*3/uL (ref 150–400)
RBC: 4.33 MIL/uL (ref 3.87–5.11)
RDW: 15.3 % (ref 11.5–15.5)
WBC: 7.1 10*3/uL (ref 4.0–10.5)

## 2011-02-12 LAB — COMPREHENSIVE METABOLIC PANEL
ALT: 17 U/L (ref 0–35)
AST: 20 U/L (ref 0–37)
Alkaline Phosphatase: 41 U/L (ref 39–117)
Calcium: 8.8 mg/dL (ref 8.4–10.5)
GFR calc Af Amer: >60 mL/min (ref >60)
Glucose, Bld: 109 mg/dL — ABNORMAL HIGH (ref 70–99)
Potassium: 3.8 mEq/L (ref 3.5–5.1)
Sodium: 136 mEq/L (ref 135–145)
Total Protein: 7 g/dL (ref 6.0–8.3)

## 2011-02-12 LAB — TSH: TSH: 0.998 u[IU]/mL (ref 0.350–4.500)

## 2011-02-12 LAB — URINE MICROSCOPIC-ADD ON

## 2011-02-12 LAB — PROTEIN ELECTROPH W RFLX QUANT IMMUNOGLOBULINS
Alpha-1-Globulin: 6.6 % — ABNORMAL HIGH (ref 2.9–4.9)
Alpha-2-Globulin: 10.4 % (ref 7.1–11.8)
Beta Globulin: 5.7 % (ref 4.7–7.2)
Gamma Globulin: 20.6 % — ABNORMAL HIGH (ref 11.1–18.8)
Total Protein ELP: 7.3 g/dL (ref 6.0–8.3)

## 2011-02-12 LAB — URINE CULTURE

## 2011-02-12 LAB — BASIC METABOLIC PANEL
BUN: 12 mg/dL (ref 6–23)
Calcium: 9 mg/dL (ref 8.4–10.5)
Chloride: 101 mEq/L (ref 96–112)
Creatinine, Ser: 0.73 mg/dL (ref 0.4–1.2)
GFR calc Af Amer: >60 mL/min (ref >60)
GFR calc non Af Amer: >60 mL/min (ref >60)
Potassium: 3.8 mEq/L (ref 3.5–5.1)
Sodium: 137 mEq/L (ref 135–145)

## 2011-02-12 LAB — DIFFERENTIAL
Basophils Absolute: 0 10*3/uL (ref 0.0–0.1)
Basophils Relative: 1 % (ref 0–1)
Eosinophils Absolute: 0.2 10*3/uL (ref 0.0–0.7)
Lymphs Abs: 2.9 10*3/uL (ref 0.7–4.0)
Neutrophils Relative %: 48 % (ref 43–77)

## 2011-02-12 LAB — CK TOTAL AND CKMB (NOT AT ARMC)
CK, MB: 1.8 ng/mL (ref 0.3–4.0)
Relative Index: 0.9 (ref 0.0–2.5)

## 2011-02-12 LAB — TROPONIN I
Troponin I: 0.04 ng/mL (ref 0.00–0.06)
Troponin I: <0.01 ng/mL (ref 0.00–0.06)

## 2011-02-12 LAB — CARDIAC PANEL(CRET KIN+CKTOT+MB+TROPI)
Relative Index: 0.7 (ref 0.0–2.5)
Relative Index: 0.8 (ref 0.0–2.5)
Total CK: 187 U/L — ABNORMAL HIGH (ref 7–177)
Troponin I: 0.02 ng/mL (ref 0.00–0.06)
Troponin I: <0.01 ng/mL (ref 0.00–0.06)

## 2011-02-12 LAB — LIPID PANEL
Cholesterol: 163 mg/dL (ref 0–200)
HDL: 38 mg/dL — ABNORMAL LOW (ref >39)
Total CHOL/HDL Ratio: 4.3 RATIO
VLDL: 32 mg/dL (ref 0–40)

## 2011-02-12 LAB — POCT CARDIAC MARKERS
Myoglobin, poc: 117 ng/mL (ref 12–200)
Troponin i, poc: <0.05 ng/mL (ref 0.00–0.09)

## 2011-02-12 LAB — MAGNESIUM: Magnesium: 2.3 mg/dL (ref 1.5–2.5)

## 2011-02-12 LAB — URINALYSIS, ROUTINE W REFLEX MICROSCOPIC
Bilirubin Urine: NEGATIVE
Glucose, UA: NEGATIVE mg/dL
Hgb urine dipstick: NEGATIVE
Ketones, ur: NEGATIVE mg/dL
Protein, ur: NEGATIVE mg/dL

## 2011-02-12 LAB — PHOSPHORUS: Phosphorus: 4.8 mg/dL — ABNORMAL HIGH (ref 2.3–4.6)

## 2011-02-26 LAB — COMPREHENSIVE METABOLIC PANEL
ALT: 17 U/L (ref 0–35)
BUN: 7 mg/dL (ref 6–23)
CO2: 28 mEq/L (ref 19–32)
Calcium: 9 mg/dL (ref 8.4–10.5)
Creatinine, Ser: 0.72 mg/dL (ref 0.4–1.2)
GFR calc non Af Amer: >60 mL/min (ref >60)
Glucose, Bld: 103 mg/dL — ABNORMAL HIGH (ref 70–99)
Sodium: 137 mEq/L (ref 135–145)

## 2011-02-26 LAB — POCT CARDIAC MARKERS: Myoglobin, poc: 124 ng/mL (ref 12–200)

## 2011-02-26 LAB — DIFFERENTIAL
Eosinophils Absolute: 0.2 10*3/uL (ref 0.0–0.7)
Lymphs Abs: 2.8 10*3/uL (ref 0.7–4.0)
Neutrophils Relative %: 48 % (ref 43–77)

## 2011-02-26 LAB — CBC
HCT: 39.6 % (ref 36.0–46.0)
Hemoglobin: 13 g/dL (ref 12.0–15.0)
MCHC: 32.8 g/dL (ref 30.0–36.0)
MCV: 85.8 fL (ref 78.0–100.0)
RBC: 4.61 MIL/uL (ref 3.87–5.11)

## 2011-03-23 ENCOUNTER — Encounter (HOSPITAL_COMMUNITY)
Admission: RE | Admit: 2011-03-23 | Discharge: 2011-03-23 | Disposition: A | Discharge status: Home / Self Care | Payer: Medicare Other | Source: Ambulatory Visit | Attending: Orthopedic Surgery | Admitting: Orthopedic Surgery

## 2011-03-23 LAB — URINALYSIS, ROUTINE W REFLEX MICROSCOPIC
Bilirubin Urine: NEGATIVE
Glucose, UA: NEGATIVE mg/dL
Specific Gravity, Urine: 1.024 (ref 1.005–1.030)
pH: 6 (ref 5.0–8.0)

## 2011-03-23 LAB — SURGICAL PCR SCREEN
MRSA, PCR: NEGATIVE
Staphylococcus aureus: NEGATIVE

## 2011-03-23 LAB — COMPREHENSIVE METABOLIC PANEL
Alkaline Phosphatase: 44 U/L (ref 39–117)
BUN: 11 mg/dL (ref 6–23)
Calcium: 9.4 mg/dL (ref 8.4–10.5)
GFR calc non Af Amer: >60 mL/min (ref >60)
Glucose, Bld: 138 mg/dL — ABNORMAL HIGH (ref 70–99)
Total Protein: 7.6 g/dL (ref 6.0–8.3)

## 2011-03-23 LAB — CBC
HCT: 39.2 % (ref 36.0–46.0)
MCV: 81.7 fL (ref 78.0–100.0)
RBC: 4.8 MIL/uL (ref 3.87–5.11)
RDW: 14.5 % (ref 11.5–15.5)
WBC: 7.2 10*3/uL (ref 4.0–10.5)

## 2011-03-23 LAB — PROTIME-INR
INR: 0.98 (ref 0.00–1.49)
Prothrombin Time: 13.2 seconds (ref 11.6–15.2)

## 2011-03-23 LAB — TYPE AND SCREEN
ABO/RH(D): O POS
Antibody Screen: NEGATIVE

## 2011-03-23 LAB — URINE MICROSCOPIC-ADD ON

## 2011-03-25 ENCOUNTER — Ambulatory Visit (HOSPITAL_COMMUNITY)
Admission: RE | Admit: 2011-03-25 | Discharge: 2011-03-25 | Disposition: A | Discharge status: Home / Self Care | Payer: Medicare Other | Source: Ambulatory Visit | Attending: Orthopedic Surgery | Admitting: Orthopedic Surgery

## 2011-03-25 DIAGNOSIS — M1710 Unilateral primary osteoarthritis, unspecified knee: Secondary | ICD-10-CM | POA: Insufficient documentation

## 2011-03-25 DIAGNOSIS — Z01811 Encounter for preprocedural respiratory examination: Secondary | ICD-10-CM | POA: Insufficient documentation

## 2011-03-25 DIAGNOSIS — Z01818 Encounter for other preprocedural examination: Secondary | ICD-10-CM | POA: Insufficient documentation

## 2011-03-25 DIAGNOSIS — Z01812 Encounter for preprocedural laboratory examination: Secondary | ICD-10-CM | POA: Insufficient documentation

## 2011-03-25 DIAGNOSIS — IMO0002 Reserved for concepts with insufficient information to code with codable children: Secondary | ICD-10-CM | POA: Insufficient documentation

## 2011-04-09 NOTE — H&P (Signed)
[name], [name]                ACCOUNT NO.:  [id number]   MEDICAL RECORD NO.:  [id number]          PATIENT TYPE:  INP   LOCATION:  4706                         FACILITY:  [facility]   PHYSICIAN:  Kela Millin, M.D.DATE OF BIRTH:  1956/04/19   DATE OF ADMISSION:  10/12/2006  DATE OF DISCHARGE:                              HISTORY & PHYSICAL   PRIMARY CARE PHYSICIAN:  Unassigned.   CHIEF COMPLAINT:  Syncope.   HISTORY OF PRESENT ILLNESS:  The patient is a 55 year old black female  with past medical history significant for hypertension, who presents  following a syncopal episode at the store.  She states that for a few  days now she has just been feeling very weak and tired but continued to  think that this was just going to go away, and today she went shopping  at [employer] with a friend and after checking out an item, she was walking  toward a chair to sit down when she passed out.  She denies dizziness  (it was noted in ER records that she had some dizziness but she states  that she never felt dizzy at any point), she also denies chest pain,  dysphagia, focal weakness, fevers, dysuria, melena.  No hematochezia.  She states that when she regained consciousness, she was lying on the  floor and many people were standing over her in the store.  She does not  know for how long she was unconscious and none of the witnesses are  available at this time.  No seizure-like activity reported, no urinary  or stool incontinence.  She denies palpitations, cough, focal weakness,  headaches, and no diarrhea.   The patient was seen in the ER and a CT scan of her head was negative  for acute intracranial findings; it showed a left frontal calcified  meningioma versus exostosis with mild indentation upon adjacent brain  parenchyma, no vasogenic edema noted.  Her chest x-ray was negative for  acute findings.  Her alcohol level was less than 5.  Urine drug screen  was negative.  Her point of care  markers were also negative.  She is  admitted to the [facility] service for further evaluation and  management.   PAST MEDICAL HISTORY:  As stated above.   MEDICATIONS:  Metoprolol.   ALLERGIES:  PENICILLIN AND CODEINE.   SOCIAL HISTORY:  She denies tobacco.  Also denies alcohol.  No illicit  drug use.   FAMILY HISTORY:  Her mom and sister both have hypertension and diabetes,  no history of MI.   REVIEW OF SYSTEMS:  As per HPI, other review of systems is negative.   PHYSICAL EXAMINATION:  GENERAL:  The patient is a morbidly obese 50-year-  old black female.  She is alert and oriented, in no apparent distress.  VITAL SIGNS:  Temperature 97.5, blood pressure 148/89, pulse of 68, and  respiratory rate of 20.  O2 sat of 99%.  HEENT:  PERRL, EOMI.  Sclerae are anicteric.  Moist mucous membranes.  No exudates.  NECK:  Supple.  No adenopathy, no carotid bruits appreciated.  No JVD.  LUNGS:  Decreased breath sounds at the bases, otherwise clear to  auscultation bilaterally.  CARDIOVASCULAR:  Regular rate and rhythm.  Normal S1, S2.  ABDOMEN:  Obese, bowel sounds present.  Nontender, nondistended.  No  organomegaly.  No masses palpable.  EXTREMITIES:  No cyanosis.  No edema.  NEURO:  Alert, oriented x3.  Cranial nerves, II through XII, are grossly  intact.  Nonfocal exam.   LABORATORY DATA:  CT scan of head as above.  CT scan of neck - slightly  limited exam secondary to patient body habitus and motion, no gross  fracture or malalignment noted.  Urinalysis is cloudy, leukocyte  esterase moderate, urine WBC 3-6, a few bacteria.  Her white cell count  is 6.3, hemoglobin 12.9, hematocrit 37.9, platelet count 247, neutrophil  count 48%.  Her point of care markers are negative.  Her urine drug  screen is negative.  Her sodium is 138, potassium 3.6, chloride 100, CO2  of 31, glucose 102, BUN 7, creatinine 0.7, calcium 9.2.   ASSESSMENT AND PLAN:  1. Syncope - we will obtain  orthostatics, check serial cardiac      enzymes, 2-D echo and carotid Doppler ultrasound, and magnetic      resonance imaging.  Follow.  2. Left frontal brain lesion - calcified meningioma versus exostosis      with mild indentation upon adjacent brain parenchyma, no vasogenic      edema.  We will obtain an MRI as above and consult neurosurgery for      further recommendations.  3. Hypertension - continue metoprolol.  4. Probable urinary tract infection - obtain urine cultures, empiric      antibiotics, and follow.      Kela Millin, M.D.  Electronically Signed     ACV/MEDQ  D:  10/13/2006  T:  10/13/2006  Job:  [id number]

## 2011-04-09 NOTE — Discharge Summary (Signed)
NAMELEHUA, FLORES                ACCOUNT NO.:  [id number]   MEDICAL RECORD NO.:  [id number]           PATIENT TYPE:   LOCATION:  4706                         FACILITY:  [facility]   PHYSICIAN:  Kela Millin, M.D.DATE OF BIRTH:  [birth year]   DATE OF ADMISSION:  10/12/2006  DATE OF DISCHARGE:  10/16/2006                               DISCHARGE SUMMARY   DISCHARGE DIAGNOSES:  1. Syncope - unclear etiology, workup so far negative.  2. Urinary tract infection.  3. Hypertension.  4. Calcific/ossific density, left frontal per CT scan - benign dural      ossification versus meningioma, patient to follow up with      Neurosurgery.   CONSULTATIONS:  Neurosurgery. Cristi Loron, MD.   PROCEDURES AND STUDIES:  1. A CT scan of head with contrast - no fracture or intracranial      hemorrhage.  Left frontal calcified meningioma versus exostosis      with mild indentation upon adjacent brain parenchyma without      vasogenic edema.  2. Two-D echo - overall left ventricular systolic function normal,      ejection fraction 60%, no left ventricular regional wall motion      abnormalities noted.  3. Carotid Doppler ultrasound - no evidence of ICA stenosis      bilaterally.  4. EEG - pending - follow up.   HISTORY:  The patient is a 55 year old black female with past medical  history significant for hypertension, who presented following a syncopal  episode at the store.  She stated that she had been feeling weak and  tired but continued to think that it would resolve.  On the day of  presentation, she went to [employer] with a friend and as she was walking to  sit down she passed out.  She denied dizziness, chest pain, dysphagia,  focal weakness, fevers, dysuria, and/or melena, also no hematochezia.  It was reported that she was unconscious but is unclear exactly for how  long, no seizure-like activity reported, no stool or urinary  incontinence.  The patient also denied palpations, cough,  headaches, and  no diarrhea.  In the ER, she had a CT scan of the head and the results  as stated above.  Her chest x-ray was negative for acute findings.  She  had an abnormal urinalysis, her point-of-care markers were negative, she  was admitted to the [facility] service for further evaluation  and management.  Her urine drug screen was negative.   PHYSICAL EXAMINATION UPON ADMISSION:  GENERAL:  Obese in no apparent  distress.  VITAL SIGNS:  Temperature of 97.5 with a blood pressure of 148/89, pulse  of 68, respiratory rate of 20, O2 SAT of 99%, and the rest of her  physical exam was noted to be within normal limits.   LABORATORY DATA:  Urinalysis:  Cloudy, moderate leukocyte-esterase, 3 to  6 WBCs, a few bacteria, and her white cell count 6.3, hemoglobin 12.9,  hematocrit 37.9, and platelet count of 247, neutrophil count of 48%,  point-of-care markers negative, urine drug screen negative, CT scan of  the head as above.  Sodium 138, potassium 3.6, chloride of 100, CO2 31,  a glucose of 102, BUN of 7, creatinine 0.7, calcium 9.2.   HOSPITAL COURSE:  1. Syncope - upon admission, the patient had orthostatics done and she      was not orthostatic.  Serial cardiac enzymes were done and these      were negative.  She had a two-D echo and the results as stated      above within normal limits with an ejection fraction of 60% and no      wall motion abnormalities.  Her carotid Doppler ultrasound revealed      no ICA stenosis, and also she had an EEG done and I am awaiting the      results at the time of this dictation.  The patient is to follow up      with her primary care physician at [facility].  She has remained      afebrile and hemodynamically stable and no syncopal episodes      throughout her hospital stay.  The patient also had a TSH done      while she was in the hospital and this was within normal limits -      0.863.  2. Urinary tract infection - the patient had an  abnormal urinalysis as      indicated above, and urine cultures were done.  It showed growth of      100,000 CSUs, but just of multiple bacterial morphotypes, none      predominant.  The patient empirically received three days of Cipro      while in the hospital, and she has remained afebrile,      hemodynamically stable, and asymptomatic.  3. Hypertension - her blood pressure was controlled with Metoprolol      during her hospital stay, and she will continue this upon      discharge.  4. Left frontal ring lesion - as above per CT scan on admission.  The      patient also had a repeat CT scan done and per radiologist it was      read as calcific/ossific density at the left frontal vertex      emanating from the internal table compatible with benign dural      ossification as opposed to a meningioma.  Dr. Lovell Sheehan -      neurosurgeon was consulted while patient was in the hospital.  He      saw patient and ordered an MRI, which could not be done here in the      hospital because of the weight limit on the MRI here, and the      patient is to get an MRI as an outpatient and follow up with Dr.      Lovell Sheehan in his office in about two weeks.  The patient has also      been instructed not to drive until further notice as per Dr.      Lovell Sheehan.   DISCHARGE MEDICATIONS:  Metoprolol 25 mg p.o. b.i.d.   FOLLOWUP CARE:  1. Turkey R. Rankins, MD, [facility], in a week, follow up on EEG      as well.  2. Cristi Loron, MD, in two to three weeks.   DISCHARGE CONDITION:  Improved/stable.      Kela Millin, M.D.  Electronically Signed     ACV/MEDQ  D:  10/16/2006  T:  10/16/2006  Job:  [id number]   cc:   Fanny Dance. Rankins, M.D.  Cristi Loron, M.D.

## 2011-04-09 NOTE — Procedures (Signed)
DATE OF PROCEDURE:  10/14/2006.   CURRENT HISTORY:  The patient is described as awake and alert.  This is  routine EEG done with photic stimulation, hyperventilation.  A 55 year old  with history of syncope.  EEG is for evaluation.   DESCRIPTION:  The dominant rhythm is traced against moderate amplitude alpha  rhythm of 10-11 Hz which predominates posteriorly, appears without abnormal  asymmetry and attenuates without and closing.  Low amplitude fast activity  seen frontally and centrally appears without abnormal asymmetry.  No focal  slowing is noted throughout the majority of recording.  No epileptiform  discharges seen at any point.  The patient ran awake state throughout the  recording. During photic stimulation and hyperventilation intermittent  slowing in the left frontal temporal area  into the 7-8 Hz range is seen  without accompanying size of irritability. Single channel devoted  EKG  revealed sinus rhythm throughout with a rate of approximately 50 beats per  minute.   CONCLUSIONS:  Mildly abnormal study due to the presence of rare intermittent  left frontal temporal slowing seen primarily during photic stimulation and  hyperventilation. The finding raises the possibility of underlying focal  pathology and clinical correlation is recommended.  No epileptiform  discharges are seen.      Michael L. Thad Ranger, M.D.  Electronically Signed     JYN:WGNF  D:  10/17/2006 12:40:36  T:  10/17/2006 14:38:26  Job #:  [id number]

## 2011-04-09 NOTE — Op Note (Signed)
[facility]  Patient:    [name], [name] Visit Number: [id number] MRN: [id number]          Service Type: DSU Location: DAY Attending Physician:  Marin Comment Dictated by:   Pershing Cox, M.D. Proc. Date: 09/29/01 Admit Date:  09/29/2001   CC:         Geraldo Pitter, M.D.   Operative Report  PREOPERATIVE DIAGNOSIS:  Menorrhagia and endometrial filling defect on transvaginal sonogram.  POSTOPERATIVE DIAGNOSIS:  Endometrial polyp arising from the lower uterine segment posterior uterine wall.  PROCEDURE:  Fractional D&C, hysteroscopy, resection of endometrial polyp, Marcaine paracervical block.  SURGEON:  Pershing Cox, M.D.  DESCRIPTION OF PROCEDURE:  The patient was brought to the operating room with an IV in place.  She had received 1 g of Ancef in the holding area.  She was placed supine on the OR table and IV sedation was administered.  She was then placed in the [place] stirrups and the lower abdomen, perineum, upper thigh, and vagina were prepped with a solution of Hibiclens.  A bivalve speculum was inserted into the vagina which was inspected.  The cervix was visualized.  A Kevorkian curet was used to obtain endocervical curettings.  Marcaine was injected into the anterior cervical wall which was then grasped with a single tooth tenaculum.  Twenty cubic centimeters of 0.25% Marcaine were then injected into the paracervix at the 3, 4, 7, and 8 position.  The uterine sound then passed to a depth of 8.5 cm in a mid position.  Serial Pratt dilators were used to dilate the cervix to a size 35.  The resectoscope was introduced and using through-and-through sorbitol irrigation, the cavity was visualized.  Photographs were taken of the polyp lying along the posterior lower uterine segment and the tubal ostia.  A right angle wire was attached to the resectoscope.  Using 110 and 110 blend ______ cautery, the polyp was resected in  three pieces.  These were gathered as a single specimen.  A small sharp curet was then used to curet the endometrial cavity.  The polyp and curettings were submitted as a single specimen.  A Meigs curet was used for final curetting.  The tissue was collected and submitted to pathology.  The sound passed again to 8.5 cm.  The drapes were taken away, and the perineum, the lower abdomen, and thighs were wiped clean with moist towels.  The legs were lowered and the patient was moved on to a stretcher and taken to the recovery room in excellent condition.  SPECIMENS:  Endocervical curettings, endometrial curettings with polyp.  COMPLICATIONS:  None. Dictated by:   Pershing Cox, M.D. Attending Physician:  Marin Comment DD:  09/29/01 TD:  09/30/01 Job: [id number]

## 2011-04-09 NOTE — Consult Note (Signed)
NAMESHARI, NATT NO.:  [id number]   MEDICAL RECORD NO.:  [id number]          PATIENT TYPE:  INP   LOCATION:  4706                         FACILITY:  [facility]   PHYSICIAN:  Cristi Loron, M.D.DATE OF BIRTH:  Sep 10, 1956   DATE OF CONSULTATION:  10/15/2006  DATE OF DISCHARGE:                                 CONSULTATION   CHIEF COMPLAINT:  Passed out.   HISTORY OF PRESENT ILLNESS:  The patient is a 55 year old black female,  who had not been feeling well, generally fatigued, and worn out for  about 3 days prior to this admission.  On October 12, 2006, the patient  was at [facility] and she had a witnessed syncopal event.  According to  bystanders, the patient simply fell to the floor and thereafter tried to  arise.  There was no history of tonoclonic activity, tongue biting,  incontinence, etc.  The patient was brought to [facility] Emergency  Department, where she was evaluated by the Emergency Room staff and  subsequently admitted by Dr. Ailene Ards.  A syncope workup has  ensued, which has included a cranial CT scan.  This demonstrated a left  frontal lesion and a neurosurgical consultation was requested.   Presently, the patient denies headaches, nausea, or vomiting.  She has  no history of seizures.  She admits to some chronic numbness in all of  her fingertips on both her hands.  No vision or hearing changes, etc.   PAST MEDICAL HISTORY:  Positive for hypertension, right knee  osteoarthritis, and gastroesophageal reflux disease.   PAST SURGICAL HISTORY:  Right knee arthroscopy.   PRESENT MEDICATIONS:  1. Lopressor 25 mg p.o. b.i.d.  2. Aspirin 81 mg p.o. daily.  3. Protonix 40 mg p.o. a day.  4. Cipro 500 mg p.o. b.i.d.  5. Lovenox 70 mg subcutaneously daily.  6. As needed Phenergan, Tylenol, and nitroglycerin.   FAMILY MEDICAL HISTORY:  The patient's mother is age 42.  She has  hypertension, diabetes mellitus, and Alzheimer disease.  The  patient  does not know her father.   SOCIAL HISTORY:  The patient is single.  She has 2 children.  She is not  employed.  She lives in [place].  She denies tobacco, ethanol, or  drug use.   REVIEW OF SYSTEMS:  Negative, except as above.   PHYSICAL EXAMINATION:  GENERAL:  A pleasant obese 55 year old black  female in no apparent distress.  VITAL SIGNS:  Temperature 98.3 degrees Fahrenheit orally.  Blood  pressure 137/72.  Heart rate 65.  Respiratory rate 20.  Oxygen  saturation 100% on room air.  HEENT:  Normocephalic, atraumatic.  Pupils are equal, round, and  reactive to light.  Extraocular muscles are intact.  Oropharynx is  benign.  NECK:  Supple.  There are no masses, deformities, tracheal deviation, or  jugular distention.  She has normal cervical range of motion.  Spurling  testing is negative.  Lhermitte sign not present.  THORAX:  Symmetric.  HEART:  Regular rhythm.  ABDOMEN:  Soft and nontender.  EXTREMITIES:  No obvious deformities.  Back exam is normal.  NEUROLOGIC EXAM:  The patient is alert and oriented x3.  Cranial nerves,  II-XII , are examined and are bilaterally grossly normal.  Vision and  hearing grossly normal bilaterally.  Motor strength is 5/5 in the  bilateral deltoid, biceps, triceps, hand grip, psoas, quadriceps,  gastrocnemius, extensor hallucis longus.  Deep tendon reflexes are  trace/4 to 1/4 in the bilateral triceps, biceps, quadriceps, and  gastrocnemius.  There is no ankle clonus.  Sensory exam is intact to  light touch and sensation on all tested dermatomes bilaterally.  Cerebellar function is intact to rapid alternating movements throughout  the upper and lower extremities bilaterally.   LABORATORY DATA:  I reviewed the patient's cranial CT scan performed  without contrast at [facility] on October 12, 2006.  It  demonstrates an extraaxial hyperdensity in the left frontal region  consistent with either dural ossification, a small  osteoma, or possibly  meningioma.  The surrounding brain itself is normal, i.e., there is no  mass effect, edema, midline shift, etc.   ASSESSMENT/PLAN:  Left frontal lesion.  I discussed the situation with  the patient.  I told her this is likely a benign asymptomatic lesion,  but it does deserve further followup.  We will eventually get an MRI  scan on her, but I doubt this will need any intervention, but simply  followup.  Anything that would change this would be if the EEG shows  that there are left frontal seizures, then perhaps we would remove this  lesion, but this is unlikely.  I have answered all of the patient's  questions.  Please have the patient follow up with me in the office in 4  to 6 weeks.      Cristi Loron, M.D.  Electronically Signed     JDJ/MEDQ  D:  10/15/2006  T:  10/15/2006  Job:  [id number]

## 2011-04-15 ENCOUNTER — Inpatient Hospital Stay (INDEPENDENT_AMBULATORY_CARE_PROVIDER_SITE_OTHER)
Admission: RE | Admit: 2011-04-15 | Discharge: 2011-04-15 | Disposition: A | Discharge status: Home / Self Care | Payer: Medicare Other | Source: Ambulatory Visit | Attending: Emergency Medicine | Admitting: Emergency Medicine

## 2011-04-15 ENCOUNTER — Ambulatory Visit (INDEPENDENT_AMBULATORY_CARE_PROVIDER_SITE_OTHER): Payer: Medicare Other

## 2011-04-15 DIAGNOSIS — S60219A Contusion of unspecified wrist, initial encounter: Secondary | ICD-10-CM

## 2011-04-15 DIAGNOSIS — G5600 Carpal tunnel syndrome, unspecified upper limb: Secondary | ICD-10-CM

## 2011-04-26 ENCOUNTER — Other Ambulatory Visit (HOSPITAL_COMMUNITY): Payer: Medicare Other

## 2011-04-29 ENCOUNTER — Inpatient Hospital Stay (HOSPITAL_COMMUNITY)
Admission: RE | Admit: 2011-04-29 | Discharge: 2011-05-03 | DRG: 470 | Disposition: A | Discharge status: Home Health | Payer: Medicare Other | Source: Ambulatory Visit | Attending: Orthopedic Surgery | Admitting: Orthopedic Surgery

## 2011-04-29 DIAGNOSIS — M1710 Unilateral primary osteoarthritis, unspecified knee: Principal | ICD-10-CM | POA: Diagnosis present

## 2011-04-29 DIAGNOSIS — E669 Obesity, unspecified: Secondary | ICD-10-CM | POA: Diagnosis present

## 2011-04-29 DIAGNOSIS — G4733 Obstructive sleep apnea (adult) (pediatric): Secondary | ICD-10-CM | POA: Diagnosis present

## 2011-04-29 DIAGNOSIS — K449 Diaphragmatic hernia without obstruction or gangrene: Secondary | ICD-10-CM | POA: Diagnosis present

## 2011-04-29 DIAGNOSIS — Z7901 Long term (current) use of anticoagulants: Secondary | ICD-10-CM

## 2011-04-29 DIAGNOSIS — Z7982 Long term (current) use of aspirin: Secondary | ICD-10-CM

## 2011-04-29 DIAGNOSIS — F411 Generalized anxiety disorder: Secondary | ICD-10-CM | POA: Diagnosis present

## 2011-04-29 DIAGNOSIS — Z91040 Latex allergy status: Secondary | ICD-10-CM

## 2011-04-29 DIAGNOSIS — Z888 Allergy status to other drugs, medicaments and biological substances status: Secondary | ICD-10-CM

## 2011-04-29 DIAGNOSIS — I10 Essential (primary) hypertension: Secondary | ICD-10-CM | POA: Diagnosis present

## 2011-04-29 DIAGNOSIS — Z8249 Family history of ischemic heart disease and other diseases of the circulatory system: Secondary | ICD-10-CM

## 2011-04-29 DIAGNOSIS — Z880 Allergy status to penicillin: Secondary | ICD-10-CM

## 2011-04-29 DIAGNOSIS — Z79899 Other long term (current) drug therapy: Secondary | ICD-10-CM

## 2011-04-29 DIAGNOSIS — K219 Gastro-esophageal reflux disease without esophagitis: Secondary | ICD-10-CM | POA: Diagnosis present

## 2011-04-29 HISTORY — PX: TOTAL KNEE ARTHROPLASTY: SHX125

## 2011-04-29 LAB — URINALYSIS, ROUTINE W REFLEX MICROSCOPIC
Glucose, UA: NEGATIVE mg/dL
Specific Gravity, Urine: 1.014 (ref 1.005–1.030)
Urobilinogen, UA: 0.2 mg/dL (ref 0.0–1.0)
pH: 5.5 (ref 5.0–8.0)

## 2011-04-29 LAB — URINE MICROSCOPIC-ADD ON

## 2011-04-29 LAB — COMPREHENSIVE METABOLIC PANEL
Alkaline Phosphatase: 49 U/L (ref 39–117)
BUN: 7 mg/dL (ref 6–23)
Chloride: 101 mEq/L (ref 96–112)
Glucose, Bld: 117 mg/dL — ABNORMAL HIGH (ref 70–99)
Potassium: 4 mEq/L (ref 3.5–5.1)
Total Bilirubin: 0.3 mg/dL (ref 0.3–1.2)
Total Protein: 7.8 g/dL (ref 6.0–8.3)

## 2011-04-29 LAB — TYPE AND SCREEN
ABO/RH(D): O POS
Antibody Screen: NEGATIVE

## 2011-04-29 LAB — CBC
HCT: 38.9 % (ref 36.0–46.0)
MCHC: 34.7 g/dL (ref 30.0–36.0)
RDW: 14.5 % (ref 11.5–15.5)

## 2011-04-29 LAB — PROTIME-INR: Prothrombin Time: 13.1 seconds (ref 11.6–15.2)

## 2011-04-30 LAB — CBC
HCT: 32.7 % — ABNORMAL LOW (ref 36.0–46.0)
MCH: 27.8 pg (ref 26.0–34.0)
MCHC: 33.9 g/dL (ref 30.0–36.0)
MCV: 82 fL (ref 78.0–100.0)
RDW: 14.5 % (ref 11.5–15.5)

## 2011-05-01 LAB — CBC
HCT: 29.5 % — ABNORMAL LOW (ref 36.0–46.0)
MCHC: 33.6 g/dL (ref 30.0–36.0)
MCV: 81.7 fL (ref 78.0–100.0)
RDW: 14.6 % (ref 11.5–15.5)

## 2011-05-03 LAB — PROTIME-INR: INR: 1.6 — ABNORMAL HIGH (ref 0.00–1.49)

## 2011-05-06 NOTE — Op Note (Signed)
NAMECORETHA, CRESWELL NO.:  [id number]  MEDICAL RECORD NO.:  [id number]  LOCATION:  5017                         FACILITY:  [facility]  PHYSICIAN:  Myrtie Neither, MD      DATE OF BIRTH:  04-22-1956  DATE OF PROCEDURE:  04/29/2011 DATE OF DISCHARGE:                              OPERATIVE REPORT   PREOPERATIVE DIAGNOSIS:  Degenerative joint disease, left knee.  POSTOPERATIVE DIAGNOSIS:  Degenerative joint disease, left knee.  ANESTHESIA:  General.  PROCEDURE:  Left total knee arthroplasty, Biomet implant.  PROCEDURE IN DETAIL:  The patient was taken to the operating room. After given adequate preop medications, given general anesthesia, and intubated, left lower extremity was prepped with DuraPrep and draped in sterile manner.  Tourniquet and Bovie were used for hemostasis. Anterior midline incision was made over the left knee down the tibial tuberosity up to the quadriceps.  Sharp and blunt dissection was done medially.  A medial paramedian incision was made into the capsule extending from the tibial tuberosity up to the quadriceps.  Patella was reflected laterally.  Knee was taken to a flexed position.  Osteophytes about the patella, femur and tibia were resected.  Soft tissue resection was then done.  With the knee in a flexed position, tibial cutting jig was put in place at 10 mm and tibial surface was resected.  Reaming down the femoral canal was then done.  Distal femoral cutting jig at 6 degrees was put in place and distal femoral cut was made.  Sizing of the femur was found to be 72.5.  Appropriate cutting jig was put in place. Anterior and posterior cuts were made and chamfer cuts.  Femoral component found to fit very snug.  Next, attention was turned to the tibial plateau surface, which was measured at 71 mm with good coverage. Trial 10-mm, 71-mm tibial bearing with popsicle guide was put in place and found to be very tight with both extension and  flexion.  A 2 mm more the tibial surface was resected.  Again, a 10-mm trial was put in place and was found to be good fit both in flexion and extension.  Appropriate cutting jig was put in place for the tibial surface and appropriate cut was made.  With trial components at 10 mm, knee was taken to full flexion, good full extension, good medial and lateral stability.  There was some loosening of the tibial bearing surface and 12 mm found to be perfect fit.  Attention was then turned to the patella, which was found to be size large and appropriate cutting jig was put in place and patella surface was prepared.  Trial patella was put in place.  Range of motion was tested.  Full extension, full flexion, good medial and lateral stability, no subluxing of the patella was noted.  Good tracking of the patella.  Trials were removed.  Copious irrigation was then done. Methyl methacrylate was then mixed.  The tibial and patellar components were cemented and femoral component was press fitted.  Again, trials were put in place and 12 mm was found to be most stable with full extension, full flexion, good medial and lateral stability, and  again there was good patella tracking.  Knee was taken into a flexed position and final 12-mm poly was put in place and locked in with a key.  Copious irrigation was then done.  Tourniquet let down. Hemostasis obtained.  Wound closure was then done with 0 Vicryl for the fascia, 2-0 for the subcutaneous, and staples for skin.  Compressive dressing was applied.  Knee immobilizer applied.  The patient tolerated the procedure quite well and went to recovery room in stable and satisfactory condition.     Myrtie Neither, MD     AC/MEDQ  D:  04/29/2011  T:  04/30/2011  Job:  [id number]  Electronically Signed by Myrtie Neither MD on 05/06/2011 01:43:00 PM

## 2011-05-06 NOTE — H&P (Signed)
  [name], [name] NO.:  [id number]  MEDICAL RECORD NO.:  [id number]  LOCATION:  5017                         FACILITY:  [facility]  PHYSICIAN:  Myrtie Neither, MD      DATE OF BIRTH:  05-23-1956  DATE OF ADMISSION:  04/29/2011 DATE OF DISCHARGE:                             HISTORY & PHYSICAL   CHIEF COMPLAINT:  Painful left knee.  HISTORY OF PRESENT ILLNESS:  This is a 55 year old black female who has been followed in the office for degenerative joint disease involving the left knee with recurrent pain, swelling, locking and giving away and progressive loss of function in the left knee.  The patient has been treated with anti-inflammatories, therapies, injections and has history of previous arthroscopy to the left knee.  PAST MEDICAL HISTORY:  Knee arthroscopy, bilateral knees or surgical tooth extraction, polyps removed in uterus, bladder repair, history of bronchitis, hypertension.  The patient had last stress test in April 2011 and last echo March 2009.  Reflux, hiatal hernia, degenerative disk disease, anxiety.  ALLERGIES:  PENICILLIN, CODEINE, LATEX and PERCOCET.  SOCIAL HISTORY:  Negative for use of alcohol, tobacco or recreational drugs.  FAMILY HISTORY:  Hypertension.  REVIEW OF SYSTEMS:  Occasional symptoms of arthritis, otherwise symptoms of low back pain and bilateral knee pain and shoulder pain.  No symptoms of any cardiac or urinary symptoms.  PHYSICAL EXAMINATION:  VITAL SIGNS:  Weight 138.6, temperature 98.6, pulse 82, respirations 16, blood pressure 162/100. HEENT:  Head normocephalic.  Eyes, conjunctivae clear. NECK:  Supple. CHEST:  Clear. CARDIAC:  S1 and S2 regular. EXTREMITIES:  Left knee +2 effusion, genu valgum, crepitus of medial and lateral compartment.  Range of motion good, but limited and lacks full extension.  Negative Homans test.  X-rays revealed loss of medial compartment with osteophytes  and sclerosis.  IMPRESSION:  Degenerative joint disease left knee, history of hypertension, history of obesity, history of osteoarthritis.  PLAN:  Left total knee arthroplasty.     Myrtie Neither, MD     AC/MEDQ  D:  04/29/2011  T:  04/30/2011  Job:  [id number]  Electronically Signed by Myrtie Neither MD on 05/06/2011 01:42:53 PM

## 2011-05-06 NOTE — Discharge Summary (Signed)
  NAMESHIR, BERGMAN NO.:  [id number]  MEDICAL RECORD NO.:  [id number]  LOCATION:  5017                         FACILITY:  [facility]  PHYSICIAN:  Myrtie Neither, MD      DATE OF BIRTH:  03-26-1956  DATE OF ADMISSION:  04/29/2011 DATE OF DISCHARGE:  05/02/2011                              DISCHARGE SUMMARY   ADMITTING DIAGNOSIS:  Degenerative joint disease, left knee.  PERTINENT HISTORY:  This is a 55 year old female who followed in the office for degenerative joint disease involving both knees, left being worse than the right; being treated with anti-inflammatories and previous arthroscopic medications with progressive worsening in the past few months.  Pertinent physical was done at the left knee, +2 effusion, genu valgum, crepitus, medial and lateral compartments.  Range of motion good but limited with lack of full extension.  Negative Homans test.  X- rays revealed sclerosis and loss of joint space.  HOSPITAL COURSE:  The patient underwent preop laboratory, CBC, EKG, chest x-ray, PT/PTT, platelets, CMET, and UA.  The patient's lab was stable enough to undergo surgery.  The patient underwent left total knee arthroplasty, tolerated procedure quite well.  The patient had pre and postop IV antibiotics, started on prophylactic Coumadin.  Present INR is 1.45.  The patient has had physical therapy, partial weightbearing on the left side, use of CPM.  The patient is progressing with physical therapy fairly well.  Wound is healing quite well.  The patient's pain is brought under control and stable enough for discharge to nursing home facility.  The patient will need at least 2 weeks of further therapy. The patient needs daily dressing changes, physical therapy left knee, partial weightbearing 50%, continue CPM machine as well.  The patient presently is at 7 degrees of flexion and full extension, increased CPM 10 degrees per day, DCF at 9 degrees of  flexion.  MEDICATION: 1. Vicodin 5 mg one q.4 p.r.n. for pain. 2. Loratadine 10 mg daily p.r.n. allergies. 3. Extra-Strength Tylenol 1 q.6 p.r.n. 4. Restasis ophthalmic both eyes 1 drop twice daily. 5. Nexium 40 mg daily. 6. Multivitamins twice weekly. 7. Diovan HCT 160/25 daily. 8. Diovan 160 mg daily at bedtime. 9. Aspirin 81 mg daily. 10.Albuterol 90 mcg inhaler 2 puffs daily p.r.n. 11.Coumadin 5 mg daily.  Daily dressing change, left knee.  The patient is to return to the office in 1-week period.  The patient is being discharged in stable and satisfactory condition.     Myrtie Neither, MD     AC/MEDQ  D:  05/02/2011  T:  05/03/2011  Job:  [id number]  Electronically Signed by Myrtie Neither MD on 05/06/2011 01:43:05 PM

## 2011-05-12 ENCOUNTER — Observation Stay (HOSPITAL_COMMUNITY)
Admission: EM | Admit: 2011-05-12 | Discharge: 2011-05-12 | Disposition: A | Discharge status: Home / Self Care | Payer: Medicare Other | Attending: Emergency Medicine | Admitting: Emergency Medicine

## 2011-05-12 DIAGNOSIS — M7989 Other specified soft tissue disorders: Secondary | ICD-10-CM | POA: Insufficient documentation

## 2011-05-12 DIAGNOSIS — T8140XA Infection following a procedure, unspecified, initial encounter: Principal | ICD-10-CM | POA: Insufficient documentation

## 2011-05-12 DIAGNOSIS — L02419 Cutaneous abscess of limb, unspecified: Secondary | ICD-10-CM | POA: Insufficient documentation

## 2011-05-12 DIAGNOSIS — K219 Gastro-esophageal reflux disease without esophagitis: Secondary | ICD-10-CM | POA: Insufficient documentation

## 2011-05-12 DIAGNOSIS — M79609 Pain in unspecified limb: Secondary | ICD-10-CM

## 2011-05-12 DIAGNOSIS — Z7901 Long term (current) use of anticoagulants: Secondary | ICD-10-CM | POA: Insufficient documentation

## 2011-05-12 DIAGNOSIS — Z79899 Other long term (current) drug therapy: Secondary | ICD-10-CM | POA: Insufficient documentation

## 2011-05-12 DIAGNOSIS — J45909 Unspecified asthma, uncomplicated: Secondary | ICD-10-CM | POA: Insufficient documentation

## 2011-05-12 DIAGNOSIS — I10 Essential (primary) hypertension: Secondary | ICD-10-CM | POA: Insufficient documentation

## 2011-05-12 DIAGNOSIS — Y831 Surgical operation with implant of artificial internal device as the cause of abnormal reaction of the patient, or of later complication, without mention of misadventure at the time of the procedure: Secondary | ICD-10-CM | POA: Insufficient documentation

## 2011-05-12 DIAGNOSIS — L03119 Cellulitis of unspecified part of limb: Secondary | ICD-10-CM | POA: Insufficient documentation

## 2011-05-12 DIAGNOSIS — Z96659 Presence of unspecified artificial knee joint: Secondary | ICD-10-CM | POA: Insufficient documentation

## 2011-05-12 LAB — POCT I-STAT, CHEM 8
BUN: 10 mg/dL (ref 6–23)
Chloride: 98 mEq/L (ref 96–112)
Sodium: 136 mEq/L (ref 135–145)
TCO2: 28 mmol/L (ref 0–100)

## 2011-05-12 LAB — CBC
Hemoglobin: 10.3 g/dL — ABNORMAL LOW (ref 12.0–15.0)
MCH: 28.1 pg (ref 26.0–34.0)
RBC: 3.67 MIL/uL — ABNORMAL LOW (ref 3.87–5.11)
WBC: 10.8 10*3/uL — ABNORMAL HIGH (ref 4.0–10.5)

## 2011-05-12 LAB — DIFFERENTIAL
Basophils Relative: 0 % (ref 0–1)
Lymphs Abs: 2.6 10*3/uL (ref 0.7–4.0)
Monocytes Relative: 7 % (ref 3–12)
Neutro Abs: 7.2 10*3/uL (ref 1.7–7.7)
Neutrophils Relative %: 66 % (ref 43–77)

## 2011-05-12 LAB — PROTIME-INR
INR: 2.36 — ABNORMAL HIGH (ref 0.00–1.49)
Prothrombin Time: 26.2 seconds — ABNORMAL HIGH (ref 11.6–15.2)

## 2011-06-08 ENCOUNTER — Ambulatory Visit: Payer: Medicare Other | Admitting: Physical Therapy

## 2011-06-14 ENCOUNTER — Ambulatory Visit: Payer: Medicare Other | Attending: Orthopedic Surgery | Admitting: Physical Therapy

## 2011-06-14 ENCOUNTER — Ambulatory Visit: Payer: Medicare Other

## 2011-06-14 DIAGNOSIS — M2569 Stiffness of other specified joint, not elsewhere classified: Secondary | ICD-10-CM | POA: Insufficient documentation

## 2011-06-14 DIAGNOSIS — M25569 Pain in unspecified knee: Secondary | ICD-10-CM | POA: Insufficient documentation

## 2011-06-14 DIAGNOSIS — R262 Difficulty in walking, not elsewhere classified: Secondary | ICD-10-CM | POA: Insufficient documentation

## 2011-06-14 DIAGNOSIS — M25669 Stiffness of unspecified knee, not elsewhere classified: Secondary | ICD-10-CM | POA: Insufficient documentation

## 2011-06-14 DIAGNOSIS — IMO0001 Reserved for inherently not codable concepts without codable children: Secondary | ICD-10-CM | POA: Insufficient documentation

## 2011-06-16 ENCOUNTER — Ambulatory Visit: Payer: Medicare Other | Admitting: Physical Therapy

## 2011-06-21 ENCOUNTER — Encounter: Payer: Medicare Other | Admitting: Physical Therapy

## 2011-06-23 ENCOUNTER — Encounter: Payer: Medicare Other | Admitting: Physical Therapy

## 2011-06-25 ENCOUNTER — Ambulatory Visit: Payer: Medicare Other | Attending: Orthopedic Surgery | Admitting: Physical Therapy

## 2011-06-25 DIAGNOSIS — M25569 Pain in unspecified knee: Secondary | ICD-10-CM | POA: Insufficient documentation

## 2011-06-25 DIAGNOSIS — M2569 Stiffness of other specified joint, not elsewhere classified: Secondary | ICD-10-CM | POA: Insufficient documentation

## 2011-06-25 DIAGNOSIS — IMO0001 Reserved for inherently not codable concepts without codable children: Secondary | ICD-10-CM | POA: Insufficient documentation

## 2011-06-25 DIAGNOSIS — R262 Difficulty in walking, not elsewhere classified: Secondary | ICD-10-CM | POA: Insufficient documentation

## 2011-06-25 DIAGNOSIS — M25669 Stiffness of unspecified knee, not elsewhere classified: Secondary | ICD-10-CM | POA: Insufficient documentation

## 2011-06-30 ENCOUNTER — Ambulatory Visit: Payer: Medicare Other | Admitting: Physical Therapy

## 2011-07-02 ENCOUNTER — Encounter: Payer: Medicare Other | Admitting: Physical Therapy

## 2011-07-07 ENCOUNTER — Encounter: Payer: Medicare Other | Admitting: Physical Therapy

## 2011-07-09 ENCOUNTER — Ambulatory Visit: Payer: Medicare Other | Admitting: Physical Therapy

## 2011-07-14 ENCOUNTER — Ambulatory Visit: Payer: Medicare Other | Admitting: Physical Therapy

## 2011-07-16 ENCOUNTER — Ambulatory Visit: Payer: Medicare Other | Admitting: Physical Therapy

## 2011-07-20 ENCOUNTER — Ambulatory Visit: Payer: Medicare Other | Admitting: Physical Therapy

## 2011-08-20 LAB — DIFFERENTIAL
Eosinophils Absolute: 0.2
Eosinophils Relative: 3
Lymphocytes Relative: 39
Lymphs Abs: 2.8
Monocytes Relative: 6
Neutrophils Relative %: 52

## 2011-08-20 LAB — URINALYSIS, ROUTINE W REFLEX MICROSCOPIC
Bilirubin Urine: NEGATIVE
Glucose, UA: NEGATIVE
Hgb urine dipstick: NEGATIVE
Specific Gravity, Urine: 1.021
Urobilinogen, UA: 1

## 2011-08-20 LAB — POCT I-STAT, CHEM 8
BUN: 8
Creatinine, Ser: 0.9
Glucose, Bld: 118 — ABNORMAL HIGH
Hemoglobin: 13.9
Potassium: 3.8

## 2011-08-20 LAB — CBC
Hemoglobin: 13.4
MCHC: 33.3
RBC: 4.76
WBC: 7.2

## 2011-08-20 LAB — URINE MICROSCOPIC-ADD ON

## 2011-09-02 LAB — URINALYSIS, ROUTINE W REFLEX MICROSCOPIC
Glucose, UA: NEGATIVE
Ketones, ur: NEGATIVE
Nitrite: NEGATIVE
Protein, ur: NEGATIVE
Urobilinogen, UA: 1

## 2011-09-02 LAB — POCT CARDIAC MARKERS
Myoglobin, poc: 95.7
Operator id: 192351

## 2011-09-02 LAB — I-STAT 8, (EC8 V) (CONVERTED LAB)
Acid-Base Excess: 3 — ABNORMAL HIGH
BUN: 9
Chloride: 103
HCT: 43
Hemoglobin: 14.6
Operator id: 279831
Sodium: 140
pCO2, Ven: 50

## 2011-09-02 LAB — URINE MICROSCOPIC-ADD ON

## 2011-09-02 LAB — POCT I-STAT CREATININE: Creatinine, Ser: 0.9

## 2011-09-23 IMAGING — CT CT HEAD W/O CM
2 series · 16 of 30 positions shown, 20 images · non-contrast
Comparison: 01/27/2010

CLINICAL DATA: Headache.

CT HEAD WITHOUT CONTRAST
TECHNIQUE: Contiguous axial images were obtained from the base of
the skull through the vertex without contrast.

[Series 2: head w/o · axial · non-contrast · 0.49mm/px · z∈[+152,+282]mm · 13 of 32 slices shown, 17 images]
[im 3/32  brain]
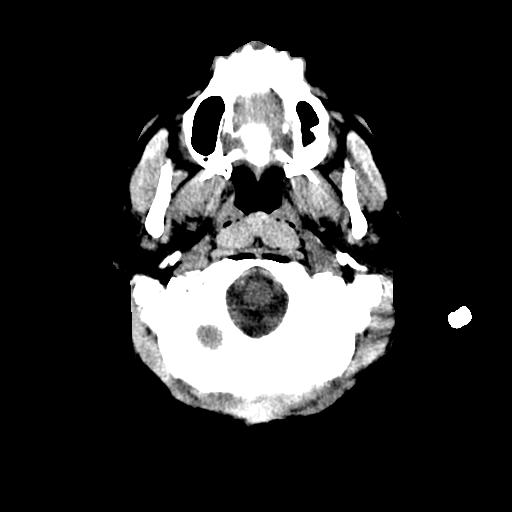
[im 3/32  bone]
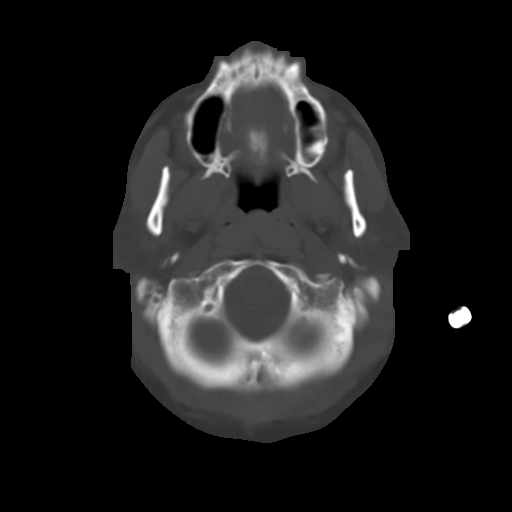
[im 5/32  brain]
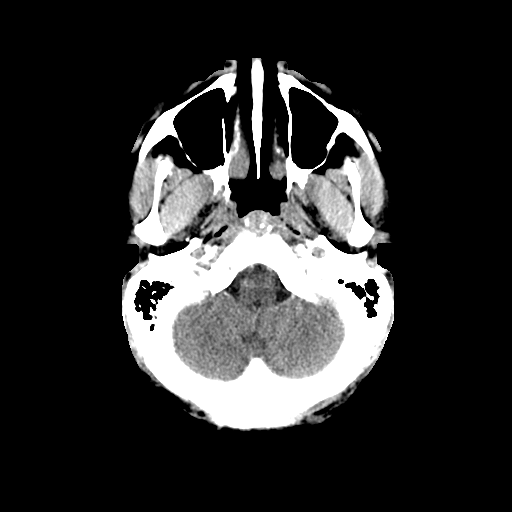
[im 7/32  brain]
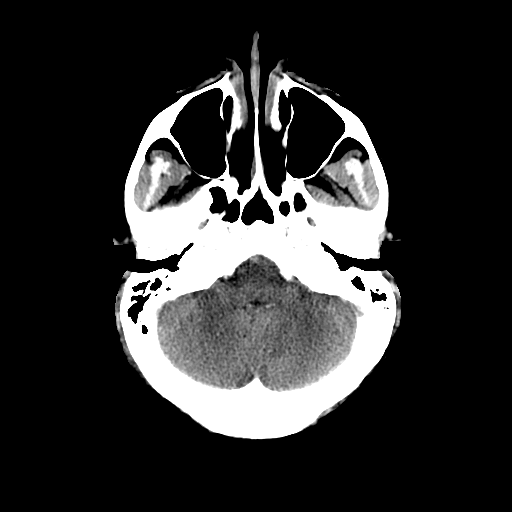
[im 9/32  brain]
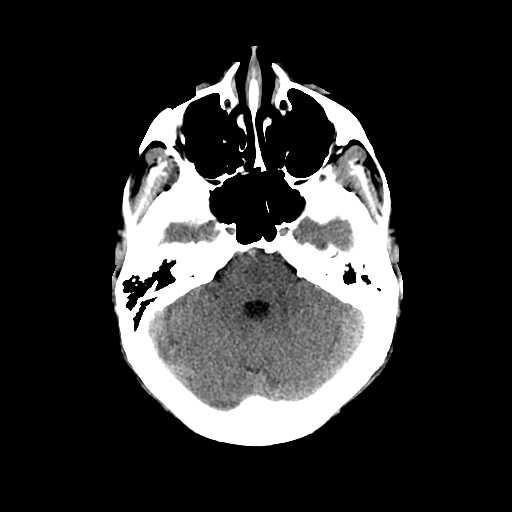
[im 12/32  brain]
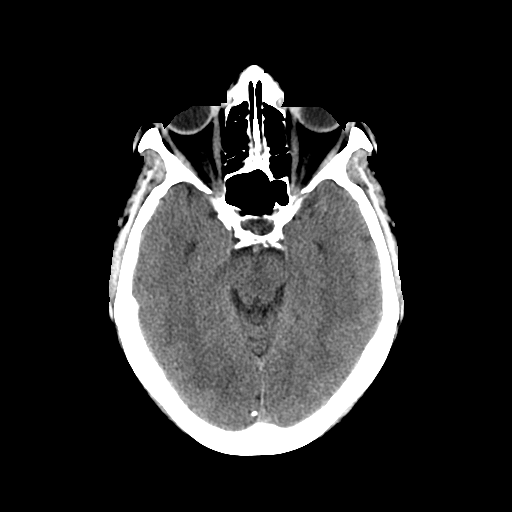
[im 12/32  bone]
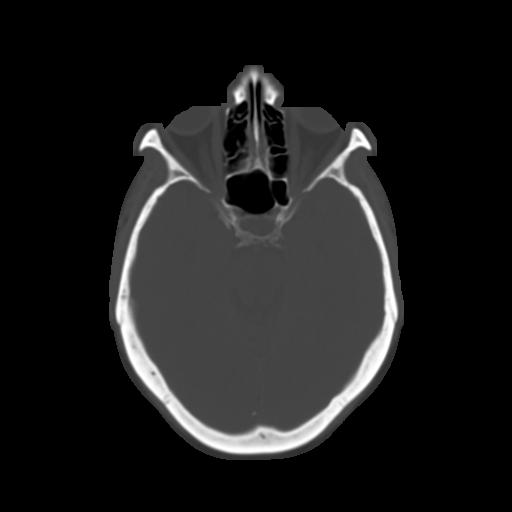
[im 14/32  brain]
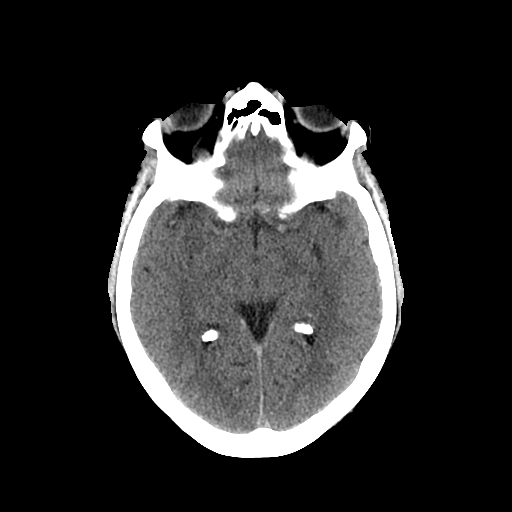
[im 16/32  brain]
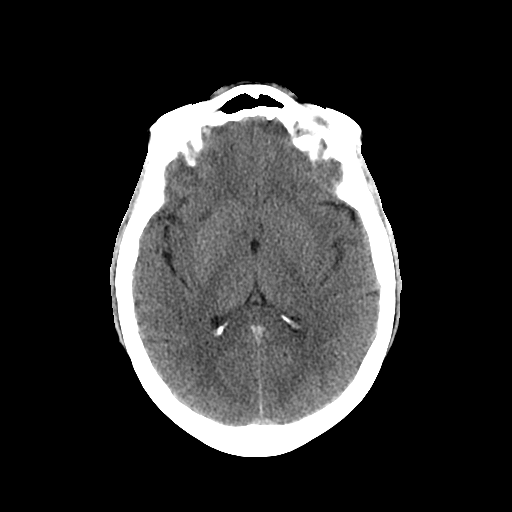
[im 18/32  brain]
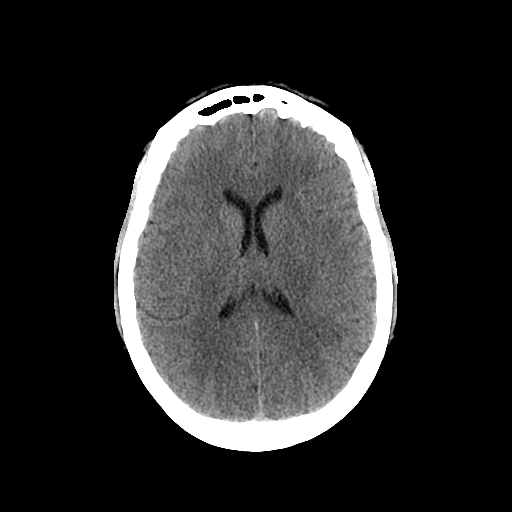
[im 20/32  brain]
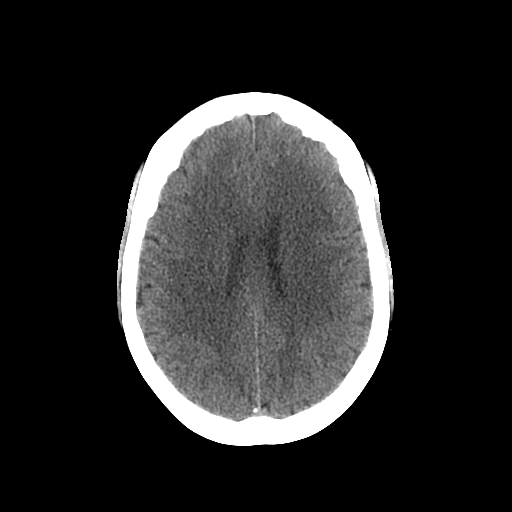
[im 20/32  bone]
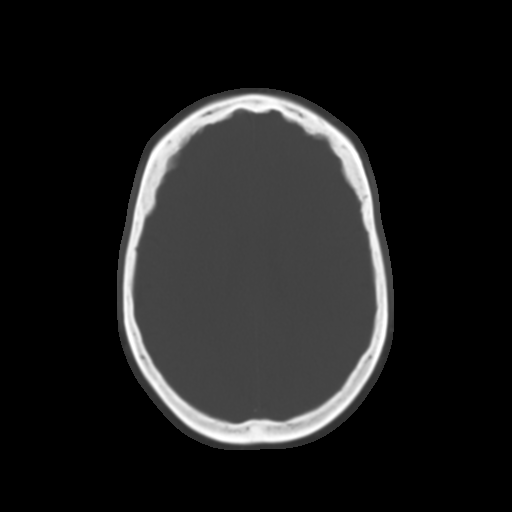
[im 23/32  brain]
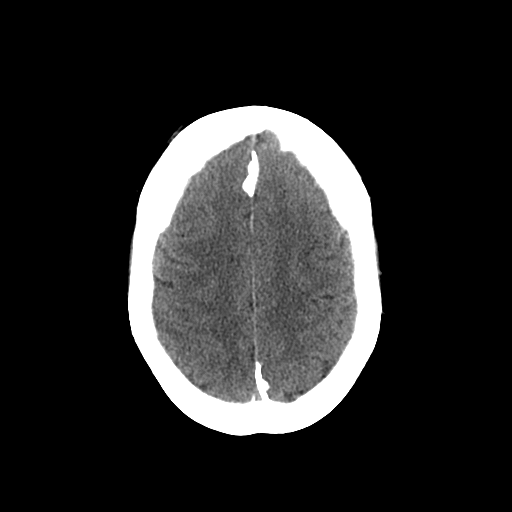
[im 25/32  brain]
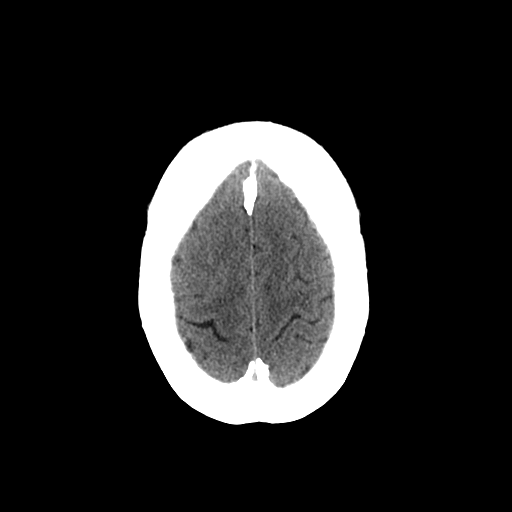
[im 27/32  brain]
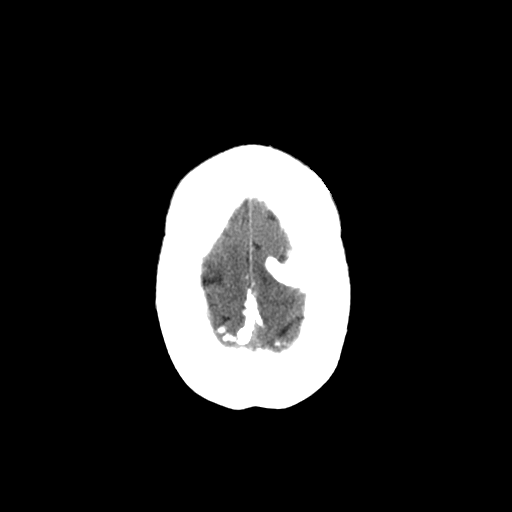
[im 29/32  brain]
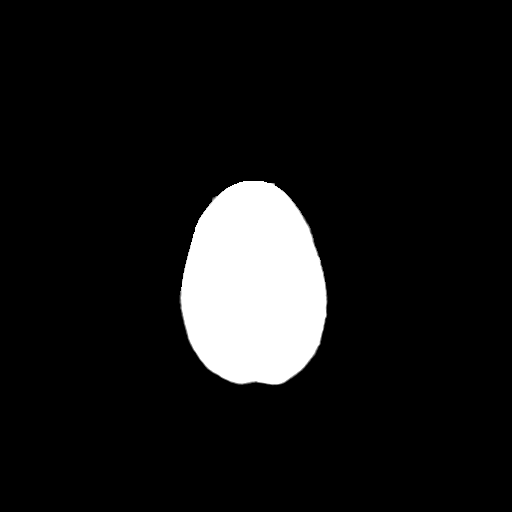
[im 29/32  bone]
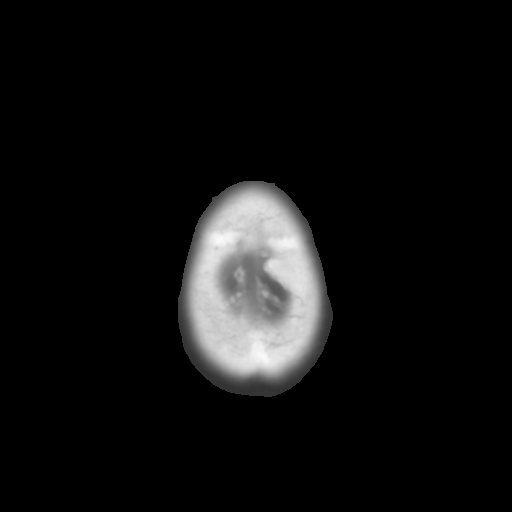

[Series 3: head w/o bone · axial · non-contrast · 0.49mm/px · z∈[+152,+197]mm · 3 of 32 slices shown]
[im 3/32  bone]
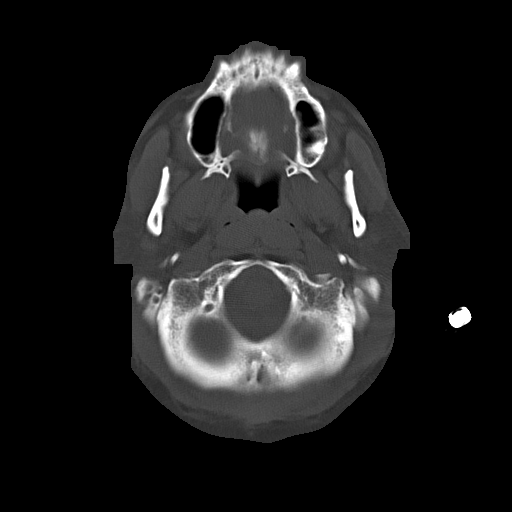
[im 7/32  bone]
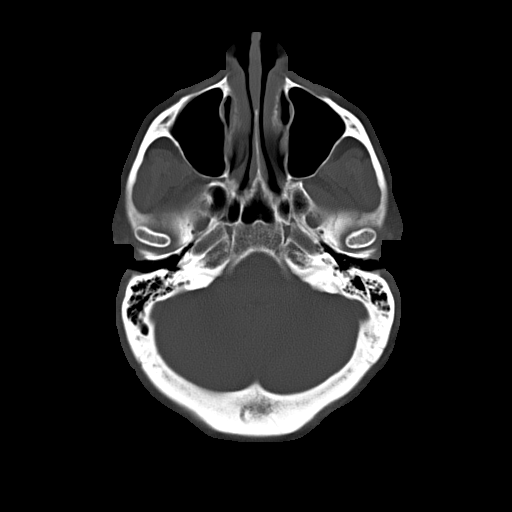
[im 12/32  bone]
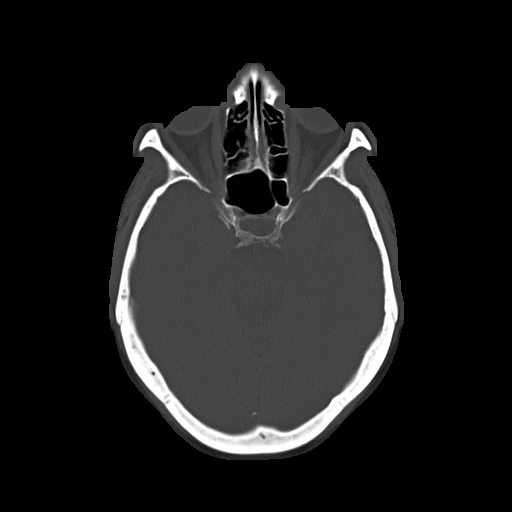

[16 of 30 positions shown; findings below may reference images not displayed]

FINDINGS: Bone windows demonstrate clear paranasal sinuses and
mastoid air cells.

Soft tissue windows demonstrate no hemorrhage, hydrocephalus, intra-
axial, extra-axial fluid collection.  No acute infarct.

Within the foramen of Lindner, Marciela 5 mm focus of increased density is
similar back to 10/14/2006.  Image 17.
IMPRESSION: 1.  5 mm focus of increased density at the foramen of Zengin.  This
is similar back to 10/14/2006.  Favor a tiny colloid cyst.  Minimal
choroid could look similar but is felt less likely.
2.  Otherwise, no acute intracranial abnormality.

## 2011-09-24 ENCOUNTER — Inpatient Hospital Stay (INDEPENDENT_AMBULATORY_CARE_PROVIDER_SITE_OTHER)
Admission: RE | Admit: 2011-09-24 | Discharge: 2011-09-24 | Disposition: A | Discharge status: Home / Self Care | Payer: Medicare Other | Source: Ambulatory Visit | Attending: Emergency Medicine | Admitting: Emergency Medicine

## 2011-09-24 DIAGNOSIS — G500 Trigeminal neuralgia: Secondary | ICD-10-CM

## 2011-09-24 LAB — GLUCOSE, CAPILLARY: Glucose-Capillary: 100 mg/dL — ABNORMAL HIGH (ref 70–99)

## 2011-11-26 ENCOUNTER — Other Ambulatory Visit: Payer: Self-pay | Admitting: Family Medicine

## 2011-11-26 ENCOUNTER — Other Ambulatory Visit (HOSPITAL_COMMUNITY)
Admission: RE | Admit: 2011-11-26 | Discharge: 2011-11-26 | Disposition: A | Discharge status: Home / Self Care | Payer: Medicare Other | Source: Ambulatory Visit | Attending: Family Medicine | Admitting: Family Medicine

## 2011-11-26 DIAGNOSIS — Z124 Encounter for screening for malignant neoplasm of cervix: Secondary | ICD-10-CM | POA: Insufficient documentation

## 2011-11-29 ENCOUNTER — Other Ambulatory Visit (HOSPITAL_COMMUNITY): Payer: Self-pay | Admitting: Family Medicine

## 2011-11-29 DIAGNOSIS — Z1231 Encounter for screening mammogram for malignant neoplasm of breast: Secondary | ICD-10-CM

## 2011-11-30 ENCOUNTER — Encounter: Payer: Self-pay | Admitting: Gastroenterology

## 2011-12-07 DIAGNOSIS — E611 Iron deficiency: Secondary | ICD-10-CM

## 2011-12-07 HISTORY — DX: Iron deficiency: E61.1

## 2011-12-08 ENCOUNTER — Ambulatory Visit (INDEPENDENT_AMBULATORY_CARE_PROVIDER_SITE_OTHER): Payer: Medicare Other | Admitting: Gastroenterology

## 2011-12-08 ENCOUNTER — Encounter: Payer: Self-pay | Admitting: Gastroenterology

## 2011-12-08 VITALS — BP 150/80 | HR 77 | Ht 68.0 in | Wt 322.0 lb

## 2011-12-08 DIAGNOSIS — D509 Iron deficiency anemia, unspecified: Secondary | ICD-10-CM | POA: Insufficient documentation

## 2011-12-08 DIAGNOSIS — K625 Hemorrhage of anus and rectum: Secondary | ICD-10-CM

## 2011-12-08 NOTE — Assessment & Plan Note (Signed)
Rectal bleeding may certainly be due to the hemorrhoids. A more proximal colonic bleeding source should be ruled out.  Medications #1 colonoscopy #2 if no  other bleeding source is seen I would consider surgical hemorrhoidectomy

## 2011-12-08 NOTE — Patient Instructions (Signed)
Your Colonoscopy/Endoscopy is scheduled on 10/08/2012 at 8am Separate instructions have been given  We have given you a sample of MoviPrep today

## 2011-12-08 NOTE — Assessment & Plan Note (Signed)
Anemia could be do to chronic rectal bleeding. Other etiologies including active peptic disease, AVMs and neoplasm should also be considered.  Recommendations #1 upper endoscopy-to be done at the same time as colonoscopy

## 2011-12-08 NOTE — Progress Notes (Signed)
History of Present Illness:[name] is a 56 year old Afro-American female here at the request of Dr. Daphine Deutscher for evaluation of rectal bleeding. For the past 6 months she's been complaining of rectal bleeding consisting of moderate amounts of bright red blood per rectum following a bowel movement. Blood is mixed with stools.  At times she has passed only blood. She denies rectal or abdominal pain. She has a history of hemorrhoids. She also has a history of an iron deficiency anemia. She is on no gastric irritants including nonsteroidals.    Past Medical History  Diagnosis Date  . Seasonal allergies   . Asthma     History of  . Hypertension   . Iron deficiency 12-07-2011  . Osteoarthritis    Past Surgical History  Procedure Date  . Knee surgery     Both knees  . Total knee arthroplasty 04/29/2011    Left knee   family history includes Diabetes in her mother; Diabetes type II in her sister; Heart disease in her sister; Hypertension in her mother; and Kidney disease in her brother.  There is no history of Colon cancer. Current Outpatient Prescriptions  Medication Sig Dispense Refill  . Acetaminophen 500 MG TBDP Take by mouth. Take 2 tab as needed for joint pain.      . cycloSPORINE (RESTASIS) 0.05 % ophthalmic emulsion Place 1 drop into both eyes 2 (two) times daily.      Marland Kitchen esomeprazole (NEXIUM) 40 MG capsule Take 40 mg by mouth daily before breakfast.      . hydrochlorothiazide (MICROZIDE) 12.5 MG capsule Take 12.5 mg by mouth daily.      . pregabalin (LYRICA) 50 MG capsule Take 50 mg by mouth 3 (three) times daily. Take 1 tab by mouth daily      . valsartan (DIOVAN) 160 MG tablet Take 160 mg by mouth daily. AM & pm       Allergies as of 12/08/2011 - Review Complete 12/08/2011  Allergen Reaction Noted  . Codeine  09/18/2009  . Latex    . Oxycodone-acetaminophen  04/10/2010  . Penicillins  06/30/2007    reports that she has never smoked. She does not have any smokeless tobacco history  on file. She reports that she does not drink alcohol or use illicit drugs.     Review of Systems: She complains of headaches. She requires the use of a cane since undergoing total knee replacement Pertinent positive and negative review of systems were noted in the above HPI section. All other review of systems were otherwise negative.  Vital signs were reviewed in today's medical record Physical Exam: General: She is an obese female in no acute distress Head: Normocephalic and atraumatic Eyes:  sclerae anicteric, EOMI Ears: Normal auditory acuity Mouth: No deformity or lesions Neck: Supple, no masses or thyromegaly Lungs: Clear throughout to auscultation Heart: Regular rate and rhythm; no murmurs, rubs or bruits Abdomen: Soft, non tender and non distended. No masses, hepatosplenomegaly or hernias noted. Normal Bowel sounds Rectal: Large external hemorrhoids are present Musculoskeletal: Symmetrical with no gross deformities  Skin: No lesions on visible extremities Pulses:  Normal pulses noted Extremities: No clubbing, cyanosis, edema or deformities noted Neurological: Alert oriented x 4, grossly nonfocal Cervical Nodes:  No significant cervical adenopathy Inguinal Nodes: No significant inguinal adenopathy Psychological:  Alert and cooperative. Normal mood and affect

## 2011-12-09 ENCOUNTER — Encounter: Payer: Self-pay | Admitting: Gastroenterology

## 2011-12-09 ENCOUNTER — Ambulatory Visit (AMBULATORY_SURGERY_CENTER): Payer: Medicare Other | Admitting: Gastroenterology

## 2011-12-09 ENCOUNTER — Other Ambulatory Visit: Payer: Self-pay | Admitting: *Deleted

## 2011-12-09 DIAGNOSIS — K648 Other hemorrhoids: Secondary | ICD-10-CM

## 2011-12-09 DIAGNOSIS — K635 Polyp of colon: Secondary | ICD-10-CM

## 2011-12-09 DIAGNOSIS — D126 Benign neoplasm of colon, unspecified: Secondary | ICD-10-CM

## 2011-12-09 DIAGNOSIS — D509 Iron deficiency anemia, unspecified: Secondary | ICD-10-CM

## 2011-12-09 DIAGNOSIS — K625 Hemorrhage of anus and rectum: Secondary | ICD-10-CM

## 2011-12-09 MED ORDER — SODIUM CHLORIDE 0.9 % IV SOLN: 500.0000 mL | INTRAVENOUS | Status: DC

## 2011-12-09 NOTE — Patient Instructions (Addendum)
Hemorrhoids Hemorrhoids are enlarged (dilated) veins around the rectum. There are 2 types of hemorrhoids, and the type of hemorrhoid is determined by its location. Internal hemorrhoids occur in the veins just inside the rectum.They are usually not painful, but they may bleed.However, they may poke through to the outside and become irritated and painful. External hemorrhoids involve the veins outside the anus and can be felt as a painful swelling or hard lump near the anus.They are often itchy and may crack and bleed. Sometimes clots will form in the veins. This makes them swollen and painful. These are called thrombosed hemorrhoids. CAUSES Causes of hemorrhoids include:  Pregnancy. This increases the pressure in the hemorrhoidal veins.   Constipation.   Straining to have a bowel movement.   Obesity.   Heavy lifting or other activity that caused you to strain.  TREATMENT Most of the time hemorrhoids improve in 1 to 2 weeks. However, if symptoms do not seem to be getting better or if you have a lot of rectal bleeding, your caregiver may perform a procedure to help make the hemorrhoids get smaller or remove them completely.Possible treatments include:  Rubber band ligation. A rubber band is placed at the base of the hemorrhoid to cut off the circulation.   Sclerotherapy. A chemical is injected to shrink the hemorrhoid.   Infrared light therapy. Tools are used to burn the hemorrhoid.   Hemorrhoidectomy. This is surgical removal of the hemorrhoid.  HOME CARE INSTRUCTIONS   Increase fiber in your diet. Ask your caregiver about using fiber supplements.   Drink enough water and fluids to keep your urine clear or pale yellow.   Exercise regularly.   Go to the bathroom when you have the urge to have a bowel movement. Do not wait.   Avoid straining to have bowel movements.   Keep the anal area dry and clean.   Only take over-the-counter or prescription medicines for pain, discomfort,  or fever as directed by your caregiver.  If your hemorrhoids are thrombosed:  Take warm sitz baths for 20 to 30 minutes, 3 to 4 times per day.   If the hemorrhoids are very tender and swollen, place ice packs on the area as tolerated. Using ice packs between sitz baths may be helpful. Fill a plastic bag with ice. Place a towel between the bag of ice and your skin.   Medicated creams and suppositories may be used or applied as directed.   Do not use a donut-shaped pillow or sit on the toilet for long periods. This increases blood pooling and pain.  SEEK MEDICAL CARE IF:   You have increasing pain and swelling that is not controlled with your medicine.   You have uncontrolled bleeding.   You have difficulty or you are unable to have a bowel movement.   You have pain or inflammation outside the area of the hemorrhoids.   You have chills or an oral temperature above 102 F (38.9 C).  MAKE SURE YOU:   Understand these instructions.   Will watch your condition.   Will get help right away if you are not doing well or get worse.  Document Released: 11/05/2000 Document Revised: 07/21/2011 Document Reviewed: 03/12/2008 [facility] Patient Information 2012 [place], [place]  .Colon Polyps A polyp is extra tissue that grows inside your body. Colon polyps grow in the large intestine. The large intestine, also called the colon, is part of your digestive system. It is a long, hollow tube at the end of your digestive  tract where your body makes and stores stool. Most polyps are not dangerous. They are benign. This means they are not cancerous. But over time, some types of polyps can turn into cancer. Polyps that are smaller than a pea are usually not harmful. But larger polyps could someday become or may already be cancerous. To be safe, doctors remove all polyps and test them.  WHO GETS POLYPS? Anyone can get polyps, but certain people are more likely than others. You may have a greater chance of  getting polyps if:  You are over 50.   You have had polyps before.   Someone in your family has had polyps.   Someone in your family has had cancer of the large intestine.   Find out if someone in your family has had polyps. You may also be more likely to get polyps if you:   Eat a lot of fatty foods.   Smoke.   Drink alcohol.   Do not exercise.   Eat too much.  SYMPTOMS  Most small polyps do not cause symptoms. People often do not know they have one until their caregiver finds it during a regular checkup or while testing them for something else. Some people do have symptoms like these:  Bleeding from the anus. You might notice blood on your underwear or on toilet paper after you have had a bowel movement.   Constipation or diarrhea that lasts more than a week.   Blood in the stool. Blood can make stool look black or it can show up as red streaks in the stool.  If you have any of these symptoms, see your caregiver. HOW DOES THE DOCTOR TEST FOR POLYPS? The doctor can use four tests to check for polyps:  Digital rectal exam. The caregiver wears gloves and checks your rectum (the last part of the large intestine) to see if it feels normal. This test would find polyps only in the rectum. Your caregiver may need to do one of the other tests listed below to find polyps higher up in the intestine.   Barium enema. The caregiver puts a liquid called barium into your rectum before taking x-rays of your large intestine. Barium makes your intestine look white in the pictures. Polyps are dark, so they are easy to see.   Sigmoidoscopy. With this test, the caregiver can see inside your large intestine. A thin flexible tube is placed into your rectum. The device is called a sigmoidoscope, which has a light and a tiny video camera in it. The caregiver uses the sigmoidoscope to look at the last third of your large intestine.   Colonoscopy. This test is like sigmoidoscopy, but the caregiver  looks at all of the large intestine. It usually requires sedation. This is the most common method for finding and removing polyps.  TREATMENT   The caregiver will remove the polyp during sigmoidoscopy or colonoscopy. The polyp is then tested for cancer.   If you have had polyps, your caregiver may want you to get tested regularly in the future.  PREVENTION  There is not one sure way to prevent polyps. You might be able to lower your risk of getting them if you:  Eat more fruits and vegetables and less fatty food.   Do not smoke.   Avoid alcohol.   Exercise every day.   Lose weight if you are overweight.   Eating more calcium and folate can also lower your risk of getting polyps. Some foods that are rich  in calcium are milk, cheese, and broccoli. Some foods that are rich in folate are chickpeas, kidney beans, and spinach.   Aspirin might help prevent polyps. Studies are under way.  Document Released: 08/04/2004 Document Revised: 07/21/2011 Document Reviewed: 01/10/2008 [facility] Patient Information 2012 [place], [place].  Please follow all discharge instructions given to you by the recovery room nurse. If you have any questions or problems after discharge please call [phone]. You will receive a phone call in the am to see how you are doing and answer any questions you may have. Thank you for choosing [facility] for your health care needs.

## 2011-12-09 NOTE — Op Note (Signed)
[facility] [address] [place], [place]  [address]  COLONOSCOPY PROCEDURE REPORT  PATIENT:  [name], [name]  MR#:  [id number] BIRTHDATE:  [birth year], 55 yrs. old  GENDER:  female ENDOSCOPIST:  Barbette Hair. Arlyce Dice, MD REF. BY:  Lehman Prom, FNP PROCEDURE DATE:  12/09/2011 PROCEDURE:  Colonoscopy with snare polypectomy ASA CLASS:  Class II INDICATIONS:  rectal bleeding, Iron deficiency anemia MEDICATIONS:   MAC sedation, administered by CRNA propofol 300mg IV  DESCRIPTION OF PROCEDURE:   After the risks benefits and alternatives of the procedure were thoroughly explained, informed consent was obtained.  Digital rectal exam was performed and revealed large external hemorrhoids.   The LB 180AL [id number] endoscope was introduced through the anus and advanced to the cecum, which was identified by both the appendix and ileocecal valve, without limitations.  The quality of the prep was Moviprep fair.  The instrument was then slowly withdrawn as the colon was fully examined. <<PROCEDUREIMAGES>>  FINDINGS:  A sessile polyp was found in the sigmoid colon. It was 4 mm in size. It was found 20 cm from the point of entry. Polyp was snared without cautery. Retrieval was successful (see image2). snare polyp  Internal and external Hemorrhoids were found (see image4).  This was otherwise a normal examination of the colon (see image1).   Retroflexed views in the rectum revealed no abnormalities.    The time to cecum =  1) 7.0  minutes. The scope was then withdrawn in  1) 9.0  minutes from the cecum and the procedure completed. COMPLICATIONS:  None ENDOSCOPIC IMPRESSION: 1) 4 mm sessile polyp in the sigmoid colon 2) Internal and external hemorrhoids 3) Otherwise normal examination RECOMMENDATIONS: 1) If the polyp(s) removed today are proven to be adenomatous (pre-cancerous) polyps, you will need a repeat colonoscopy in 5 years. Otherwise you should continue to follow colorectal  cancer screening guidelines for "routine risk" patients with colonoscopy in 10 years. 2) t/c surgical hemorrhoidectomy REPEAT EXAM:   You will receive a letter from Dr. Arlyce Dice in 1-2 weeks, after reviewing the final pathology, with followup recommendations.  ______________________________ Barbette Hair Arlyce Dice, MD  CC:  n. eSIGNED:   Barbette Hair. Harutyun Monteverde at 12/09/2011 09:06 AM  Melonie Florida, [id number]

## 2011-12-09 NOTE — Progress Notes (Signed)
Patient did not experience any of the following events: a burn prior to discharge; a fall within the facility; wrong site/side/patient/procedure/implant event; or a hospital transfer or hospital admission upon discharge from the facility. (G8907) Patient did not have preoperative order for IV antibiotic SSI prophylaxis. (G8918)  

## 2011-12-10 ENCOUNTER — Telehealth: Payer: Self-pay | Admitting: *Deleted

## 2011-12-10 NOTE — Op Note (Signed)
[facility] [address] [place], [place]  [address]  ENDOSCOPY PROCEDURE REPORT  PATIENT:  [name], [name]  MR#:  [id number] BIRTHDATE:  ,  yrs. old  GENDER:  female  ENDOSCOPIST:  Barbette Hair. Arlyce Dice, MD Referred by:  Lehman Prom, FNP  PROCEDURE DATE:  12/09/2011 PROCEDURE:  EGD, diagnostic 91478 ASA CLASS:  Class II INDICATIONS:  iron deficiency anemia  MEDICATIONS:   There was residual sedation effect present from prior procedure., MAC sedation, administered by CRNA propofol 150mg IV, glycopyrrolate (Robinal) 0.2 mg IV, 0.6cc simethancone 0.6 cc PO TOPICAL ANESTHETIC:  DESCRIPTION OF PROCEDURE:   After the risks and benefits of the procedure were explained, informed consent was obtained.  The LB GIF-H180 [id number] endoscope was introduced through the mouth and advanced to the third portion of the duodenum.  The instrument was slowly withdrawn as the mucosa was fully examined. <<PROCEDUREIMAGES>>  There were multiple polyps identified. in the fundus (see image2 and image3). Multiple 2mm fundic appearing polyps  Otherwise the examination was normal (see image1).    Retroflexed views revealed Retroflexion exam demonstrated findings as previously described. The scope was then withdrawn from the patient and the procedure completed.  COMPLICATIONS:  None  ENDOSCOPIC IMPRESSION: 1) Polyps, multiple in the fundus 2) Otherwise normal examination 3) Retroflexion exam demonstrated findings as previously described. RECOMMENDATIONS:t/c hemorrhoidectomy for chronic GI bleeding f/u hemeoccults following surgery OV 6 weeks  ______________________________ Barbette Hair. Arlyce Dice, MD  CC:  n. eSIGNED:   Barbette Hair. Kaplan at 12/09/2011 09:09 AM  Melonie Florida, [phone]

## 2011-12-10 NOTE — Telephone Encounter (Signed)

## 2011-12-13 ENCOUNTER — Encounter (INDEPENDENT_AMBULATORY_CARE_PROVIDER_SITE_OTHER): Payer: Self-pay | Admitting: Surgery

## 2011-12-13 ENCOUNTER — Other Ambulatory Visit (INDEPENDENT_AMBULATORY_CARE_PROVIDER_SITE_OTHER): Payer: Self-pay | Admitting: Surgery

## 2011-12-13 ENCOUNTER — Ambulatory Visit (INDEPENDENT_AMBULATORY_CARE_PROVIDER_SITE_OTHER): Payer: Medicare Other | Admitting: Surgery

## 2011-12-13 VITALS — BP 156/84 | HR 80 | Temp 98.1°F | Resp 20 | Ht 68.0 in | Wt 321.8 lb

## 2011-12-13 DIAGNOSIS — K644 Residual hemorrhoidal skin tags: Secondary | ICD-10-CM

## 2011-12-13 DIAGNOSIS — K648 Other hemorrhoids: Secondary | ICD-10-CM

## 2011-12-13 HISTORY — DX: Other hemorrhoids: K64.8

## 2011-12-13 NOTE — Progress Notes (Signed)
Subjective:     Patient ID: [name], female   DOB: 12/21/1955, 56 y.o.   MRN: [id number]  HPI  [name]  02-09-1956 [id number]  Patient Care Team: Lehman Prom, NP as PCP - General Louis Meckel, MD as Consulting Physician (Gastroenterology)  This patient is a 56 y.o.female who presents today for surgical evaluation at the request of Dr. Arlyce Dice.   Reason for visit: Bright red blood per rectum. Hemorrhoids with itching.  [name] is a morbidly obese female who struggled with hemorrhoids for several years. Normally constipated. Has had no bleeding. Sometimes it "pours out like water". She underwent upper and lower endoscopy. Dr. Arlyce Dice saw no other major abnormalities aside from the hemorrhoids. He sent to [place] for have the patient consider surgery.  She is now taking prune juice and smoothly is to have her bowels be more regular. Her bleeding has gone down. However she does get burning and itching. She's never had any anorectal interventions.  Patient Active Problem List  Diagnoses  . HYPERLIPIDEMIA  . OBESITY  . SLEEP APNEA, OBSTRUCTIVE, MILD  . CERUMEN IMPACTION, BILATERAL  . OTITIS MEDIA, LEFT  . TINNITUS, LEFT  . HYPERTENSION  . EXTERNAL HEMORRHOIDS  . SINUSITIS  . ALLERGIC RHINITIS  . ASTHMA  . DENTAL PAIN  . GERD  . CONSTIPATION  . RECTAL BLEEDING  . HEMATURIA UNSPECIFIED  . POSTMENOPAUSAL SYNDROME  . Unspecified Pruritic Disorder  . Pain in joint, lower leg  . BUNIONS, BILATERAL  . Iron deficiency anemia, unspecified  . Hemorrhoids, internal, with bleeding & prolapse    Past Medical History  Diagnosis Date  . Seasonal allergies   . Asthma     History of  . Hypertension   . Iron deficiency 12-07-2011  . Osteoarthritis   . Myocardial infarction     unsure when  . Anemia   . GERD (gastroesophageal reflux disease)   . Heart murmur   . Osteoporosis     Past Surgical History  Procedure Date  . Knee surgery     Both knees  . Total knee  arthroplasty 04/29/2011    Left knee  . Multiple tooth extractions   . Wisdom tooth extraction   . Colonoscopy 2009  . Knee arthroscopy 1991    History   Social History  . Marital Status: Single    Spouse Name: N/A    Number of Children: 2  . Years of Education: N/A   Occupational History  . Retired    Social History Main Topics  . Smoking status: Never Smoker   . Smokeless tobacco: Not on file  . Alcohol Use: No  . Drug Use: No  . Sexually Active: Not on file   Other Topics Concern  . Not on file   Social History Narrative  . No narrative on file    Family History  Problem Relation Age of Onset  . Colon cancer Neg Hx   . Diabetes Mother   . Hypertension Mother   . Heart disease Sister     Congestive heart failure  . Diabetes type II Sister   . Kidney disease Brother     Current outpatient prescriptions:Acetaminophen 500 MG TBDP, Take by mouth. Take 2 tab as needed for joint pain., Disp: , Rfl: ;  cycloSPORINE (RESTASIS) 0.05 % ophthalmic emulsion, Place 1 drop into both eyes 2 (two) times daily., Disp: , Rfl: ;  esomeprazole (NEXIUM) 40 MG capsule, Take 40 mg by mouth daily before breakfast.,  Disp: , Rfl: ;  fluticasone (VERAMYST) 27.5 MCG/SPRAY nasal spray, Place 2 sprays into the nose daily., Disp: , Rfl:  hydrochlorothiazide (MICROZIDE) 12.5 MG capsule, Take 12.5 mg by mouth daily., Disp: , Rfl: ;  nebivolol (BYSTOLIC) 5 MG tablet, Take 5 mg by mouth daily., Disp: , Rfl: ;  pregabalin (LYRICA) 50 MG capsule, Take 50 mg by mouth 3 (three) times daily. Take 1 tab by mouth daily, Disp: , Rfl: ;  valsartan (DIOVAN) 160 MG tablet, Take 160 mg by mouth daily. AM & pm, Disp: , Rfl:   Allergies  Allergen Reactions  . Codeine   . Latex   . Oxycodone-Acetaminophen   . Penicillins     BP 156/84  Pulse 80  Temp(Src) 98.1 F (36.7 C) (Temporal)  Resp 20  Ht 5\' 8"  (1.727 m)  Wt 321 lb 12.8 oz (145.968 kg)  BMI 48.93 kg/m2     Review of Systems  Constitutional:  Negative for fever, chills, diaphoresis, appetite change and fatigue.  HENT: Negative for ear pain, sore throat, trouble swallowing, neck pain and ear discharge.   Eyes: Negative for photophobia, discharge and visual disturbance.  Respiratory: Negative for cough, choking, chest tightness and shortness of breath.   Cardiovascular: Negative for chest pain and palpitations.  Gastrointestinal: Positive for constipation and anal bleeding. Negative for nausea, vomiting, abdominal pain, diarrhea, blood in stool and rectal pain.       No personal nor family history of GI/colon cancer, inflammatory bowel disease, irritable bowel syndrome, allergy such as Celiac Sprue, dietary/dairy problems, colitis, ulcers nor gastritis.    Mild crampy abd pain occasionally - nonspecidfic  No recent sick contacts/gastroenteritis.  No travel outside the country.  No changes in diet.    BM q1-2d with prune juice & smoothies (o/w 2x/week)  Genitourinary: Negative for dysuria, frequency and difficulty urinating.  Musculoskeletal: Positive for back pain and arthralgias. Negative for myalgias and gait problem.  Skin: Negative for color change, pallor and rash.  Neurological: Negative for dizziness, speech difficulty, weakness and numbness.  Hematological: Negative for adenopathy.  Psychiatric/Behavioral: Negative for confusion and agitation. The patient is not nervous/anxious.        Objective:   Physical Exam  Constitutional: She is oriented to person, place, and time. She appears well-developed and well-nourished. No distress.  HENT:  Head: Normocephalic.  Mouth/Throat: Oropharynx is clear and moist. No oropharyngeal exudate.  Eyes: Conjunctivae and EOM are normal. Pupils are equal, round, and reactive to light. No scleral icterus.  Neck: Normal range of motion. Neck supple. No tracheal deviation present.  Cardiovascular: Normal rate, regular rhythm and intact distal pulses.   Pulmonary/Chest: Effort normal and  breath sounds normal. No respiratory distress. She exhibits no tenderness.  Abdominal: Soft. She exhibits no distension and no mass. There is no tenderness. Hernia confirmed negative in the right inguinal area and confirmed negative in the left inguinal area.       Morbidly obese  Genitourinary: No vaginal discharge found.       Perianal skin clean with good hygiene.  No pruritis.  No pilonidal disease.  No fissure.  No abscess/fistula.    R ant & post chronically prolapsed int/ext hemorrhoids, L lat int hemorrhoid as well.  Tolerates digital and anoscopic rectal exam.  Normal sphincter tone.   No rectal masses.     Musculoskeletal: Normal range of motion. She exhibits no tenderness.  Lymphadenopathy:    She has no cervical adenopathy.  Right: No inguinal adenopathy present.       Left: No inguinal adenopathy present.  Neurological: She is alert and oriented to person, place, and time. No cranial nerve deficit. She exhibits normal muscle tone. Coordination normal.  Skin: Skin is warm and dry. No rash noted. She is not diaphoretic. No erythema.  Psychiatric: She has a normal mood and affect. Her behavior is normal. Judgment and thought content normal.       Assessment:     Chronically prolapsed int/ext hemorrhoids with bleeding    Plan:     I think she will require a combination THD procedure with concurrent removal of persistent external hemorrhoids as well. I think since she is chronically prolapsed,  less aggressive interventions are not going to work. She agrees I did discuss the procedure with her  The anatomy & physiology of the anorectal region was discussed.  The pathophysiology of hemorrhoids and differential diagnosis was discussed.  Natural history risks without surgery was discussed.   I stressed the importance of a bowel regimen to have daily soft bowel movements to minimize progression of disease.  Interventions such as sclerotherapy & banding were discussed.  The  patient's symptoms are not adequately controlled by medicines and other non-operative treatments.  I feel the risks & problems of no surgery outweigh the operative risks; therefore, I recommended surgery to treat the hemorrhoids by ligation, pexy, and possible resection.  Risks such as bleeding, infection, need for further treatment, heart attack, death, and other risks were discussed.   I noted a good likelihood this will help address the problem.  Goals of post-operative recovery were discussed as well.  Possibility that this will not correct all symptoms was explained.  Post-operative pain, bleeding, constipation, and other problems after surgery were discussed.  We will work to minimize complications.   Educational handouts further explaining the pathology, treatment options, and bowel regimen were given as well.  Questions were answered.  The patient expresses understanding & wishes to proceed with surgery.

## 2011-12-13 NOTE — Patient Instructions (Signed)

## 2011-12-14 ENCOUNTER — Encounter: Payer: Self-pay | Admitting: *Deleted

## 2011-12-14 ENCOUNTER — Encounter: Payer: Self-pay | Admitting: Gastroenterology

## 2011-12-16 ENCOUNTER — Encounter: Payer: Self-pay | Admitting: *Deleted

## 2011-12-24 ENCOUNTER — Ambulatory Visit (HOSPITAL_COMMUNITY): Payer: Medicare Other

## 2011-12-31 ENCOUNTER — Encounter (HOSPITAL_COMMUNITY): Payer: Self-pay | Admitting: Pharmacy Technician

## 2012-01-03 ENCOUNTER — Encounter (HOSPITAL_COMMUNITY): Payer: Self-pay

## 2012-01-03 ENCOUNTER — Encounter (HOSPITAL_COMMUNITY)
Admission: RE | Admit: 2012-01-03 | Discharge: 2012-01-03 | Disposition: A | Discharge status: Home / Self Care | Payer: Medicare Other | Source: Ambulatory Visit | Attending: Surgery | Admitting: Surgery

## 2012-01-03 ENCOUNTER — Ambulatory Visit (HOSPITAL_COMMUNITY)
Admission: RE | Admit: 2012-01-03 | Discharge: 2012-01-03 | Disposition: A | Discharge status: Home / Self Care | Payer: Medicare Other | Source: Ambulatory Visit | Attending: Surgery | Admitting: Surgery

## 2012-01-03 DIAGNOSIS — K649 Unspecified hemorrhoids: Secondary | ICD-10-CM | POA: Insufficient documentation

## 2012-01-03 DIAGNOSIS — M47814 Spondylosis without myelopathy or radiculopathy, thoracic region: Secondary | ICD-10-CM | POA: Insufficient documentation

## 2012-01-03 DIAGNOSIS — Z01818 Encounter for other preprocedural examination: Secondary | ICD-10-CM | POA: Insufficient documentation

## 2012-01-03 DIAGNOSIS — Z01812 Encounter for preprocedural laboratory examination: Secondary | ICD-10-CM | POA: Insufficient documentation

## 2012-01-03 HISTORY — DX: Other specified postprocedural states: Z98.890

## 2012-01-03 HISTORY — DX: Other specified postprocedural states: R11.2

## 2012-01-03 HISTORY — DX: Sleep apnea, unspecified: G47.30

## 2012-01-03 LAB — BASIC METABOLIC PANEL
CO2: 27 mEq/L (ref 19–32)
Calcium: 9.6 mg/dL (ref 8.4–10.5)
Chloride: 101 mEq/L (ref 96–112)
Glucose, Bld: 130 mg/dL — ABNORMAL HIGH (ref 70–99)
Sodium: 138 mEq/L (ref 135–145)

## 2012-01-03 LAB — CBC
Hemoglobin: 13.2 g/dL (ref 12.0–15.0)
MCH: 27.8 pg (ref 26.0–34.0)
RBC: 4.74 MIL/uL (ref 3.87–5.11)
WBC: 7.1 10*3/uL (ref 4.0–10.5)

## 2012-01-03 LAB — SURGICAL PCR SCREEN: MRSA, PCR: NEGATIVE

## 2012-01-03 NOTE — Patient Instructions (Signed)
[address]  01/03/2012   Your procedure is scheduled on:  01/06/12 1100am-1240am  Report to [facility] at 0900 AM.  Call this number if you have problems the morning of surgery: [phone]   Remember:   Do not eat food:After Midnight.  May have clear liquids:until Midnight .    Take these medicines the morning of surgery with A SIP OF WATER:   Do not wear jewelry, make-up or nail polish.  Do not wear lotions, powders, or perfumes. .  Do not shave 48 hours prior to surgery.  Do not bring valuables to the hospital.  Contacts, dentures or bridgework may not be worn into surgery.      Patients discharged the day of surgery will not be allowed to drive home.  Name and phone number of your driver:   Special Instructions: CHG Shower Use Special Wash: 1/2 bottle night before surgery and 1/2 bottle morning of surgery. shower chin to toes with CHG.  Wash face and private parts with regular soapl     Please read over the following fact sheets that you were given: MRSA Information, coughing and deep breathing exercises, leg exercises

## 2012-01-03 NOTE — Pre-Procedure Instructions (Signed)
01/03/12 No antibiotic order per Ethlyn Gallery at office.  She states Dr Michaell Cowing does not order antibiotics for this surgery.

## 2012-01-03 NOTE — Pre-Procedure Instructions (Signed)
06/23/11 EKG in chart  06/23/11 LOV with Dr Rennis Golden with [facility] in chart  ECHO 01/30/10 in chart  11/25/11  [facility] - for obstructive sleep apnea  11/24/11 [facility]- migraines

## 2012-01-06 ENCOUNTER — Other Ambulatory Visit (INDEPENDENT_AMBULATORY_CARE_PROVIDER_SITE_OTHER): Payer: Self-pay | Admitting: Surgery

## 2012-01-06 ENCOUNTER — Encounter (HOSPITAL_COMMUNITY): Payer: Self-pay | Admitting: Anesthesiology

## 2012-01-06 ENCOUNTER — Encounter (HOSPITAL_COMMUNITY): Payer: Self-pay

## 2012-01-06 ENCOUNTER — Encounter (HOSPITAL_COMMUNITY)
Admission: RE | Disposition: A | Discharge status: Home / Self Care | Payer: Self-pay | Source: Ambulatory Visit | Attending: Surgery

## 2012-01-06 ENCOUNTER — Observation Stay (HOSPITAL_COMMUNITY)
Admission: RE | Admit: 2012-01-06 | Discharge: 2012-01-08 | Disposition: A | Discharge status: Home / Self Care | Payer: Medicare Other | Source: Ambulatory Visit | Attending: Surgery | Admitting: Surgery

## 2012-01-06 ENCOUNTER — Ambulatory Visit (HOSPITAL_COMMUNITY): Payer: Medicare Other | Admitting: Anesthesiology

## 2012-01-06 DIAGNOSIS — K648 Other hemorrhoids: Principal | ICD-10-CM | POA: Insufficient documentation

## 2012-01-06 DIAGNOSIS — Z23 Encounter for immunization: Secondary | ICD-10-CM | POA: Insufficient documentation

## 2012-01-06 DIAGNOSIS — R338 Other retention of urine: Secondary | ICD-10-CM | POA: Diagnosis not present

## 2012-01-06 DIAGNOSIS — G4733 Obstructive sleep apnea (adult) (pediatric): Secondary | ICD-10-CM | POA: Insufficient documentation

## 2012-01-06 DIAGNOSIS — K219 Gastro-esophageal reflux disease without esophagitis: Secondary | ICD-10-CM | POA: Insufficient documentation

## 2012-01-06 DIAGNOSIS — I10 Essential (primary) hypertension: Secondary | ICD-10-CM | POA: Diagnosis present

## 2012-01-06 DIAGNOSIS — K644 Residual hemorrhoidal skin tags: Secondary | ICD-10-CM | POA: Insufficient documentation

## 2012-01-06 DIAGNOSIS — K5900 Constipation, unspecified: Secondary | ICD-10-CM | POA: Diagnosis present

## 2012-01-06 DIAGNOSIS — G8929 Other chronic pain: Secondary | ICD-10-CM

## 2012-01-06 DIAGNOSIS — I252 Old myocardial infarction: Secondary | ICD-10-CM | POA: Insufficient documentation

## 2012-01-06 DIAGNOSIS — D509 Iron deficiency anemia, unspecified: Secondary | ICD-10-CM | POA: Insufficient documentation

## 2012-01-06 DIAGNOSIS — Z79899 Other long term (current) drug therapy: Secondary | ICD-10-CM | POA: Insufficient documentation

## 2012-01-06 DIAGNOSIS — K649 Unspecified hemorrhoids: Secondary | ICD-10-CM

## 2012-01-06 DIAGNOSIS — E785 Hyperlipidemia, unspecified: Secondary | ICD-10-CM | POA: Insufficient documentation

## 2012-01-06 DIAGNOSIS — M549 Dorsalgia, unspecified: Secondary | ICD-10-CM | POA: Insufficient documentation

## 2012-01-06 DIAGNOSIS — M2550 Pain in unspecified joint: Secondary | ICD-10-CM | POA: Insufficient documentation

## 2012-01-06 HISTORY — PX: OTHER SURGICAL HISTORY: SHX169

## 2012-01-06 SURGERY — TRANSANAL HEMORRHOIDAL DEARTERIALIZATION
Anesthesia: General | Wound class: Contaminated

## 2012-01-06 MED ORDER — SODIUM CHLORIDE 0.9 % IJ SOLN
INTRAMUSCULAR | Status: DC | PRN
  Administered 2012-01-06: 10 mL

## 2012-01-06 MED ORDER — 0.9 % SODIUM CHLORIDE (POUR BTL) OPTIME
TOPICAL | Status: DC | PRN
  Administered 2012-01-06: 1000 mL

## 2012-01-06 MED ORDER — NEBIVOLOL HCL 5 MG PO TABS
5.0000 mg | ORAL_TABLET | Freq: Once | ORAL | Status: AC
  Administered 2012-01-07: 5 mg via ORAL
  Filled 2012-01-06: qty 1

## 2012-01-06 MED ORDER — FENTANYL CITRATE 0.05 MG/ML IJ SOLN
INTRAMUSCULAR | Status: AC
  Filled 2012-01-06: qty 2

## 2012-01-06 MED ORDER — SODIUM CHLORIDE 0.9 % IV SOLN: INTRAVENOUS | Status: DC

## 2012-01-06 MED ORDER — GLYCOPYRROLATE 0.2 MG/ML IJ SOLN
INTRAMUSCULAR | Status: DC | PRN
  Administered 2012-01-06: .8 mg via INTRAVENOUS

## 2012-01-06 MED ORDER — PROPOFOL 10 MG/ML IV BOLUS
INTRAVENOUS | Status: DC | PRN
  Administered 2012-01-06: 200 mg via INTRAVENOUS

## 2012-01-06 MED ORDER — HYDROMORPHONE HCL PF 1 MG/ML IJ SOLN
0.5000 mg | INTRAMUSCULAR | Status: DC | PRN
Start: 2012-01-06 — End: 2012-01-07
  Administered 2012-01-06 (×2): 0.5 mg via INTRAVENOUS
  Administered 2012-01-06: 1 mg via INTRAVENOUS
  Filled 2012-01-06: qty 1

## 2012-01-06 MED ORDER — FENTANYL CITRATE 0.05 MG/ML IJ SOLN
INTRAMUSCULAR | Status: DC | PRN
  Administered 2012-01-06: 100 ug via INTRAVENOUS
  Administered 2012-01-06 (×2): 50 ug via INTRAVENOUS
  Administered 2012-01-06: 100 ug via INTRAVENOUS

## 2012-01-06 MED ORDER — TRAMADOL HCL 50 MG PO TABS: 50.0000 mg | ORAL_TABLET | Freq: Four times a day (QID) | ORAL | Status: AC | PRN

## 2012-01-06 MED ORDER — NEOSTIGMINE METHYLSULFATE 1 MG/ML IJ SOLN
INTRAMUSCULAR | Status: DC | PRN
  Administered 2012-01-06: 5 mg via INTRAVENOUS

## 2012-01-06 MED ORDER — PROMETHAZINE HCL 25 MG/ML IJ SOLN: 6.2500 mg | INTRAMUSCULAR | Status: DC | PRN

## 2012-01-06 MED ORDER — HYDROMORPHONE HCL PF 1 MG/ML IJ SOLN
INTRAMUSCULAR | Status: AC
  Administered 2012-01-06: 0.5 mg via INTRAVENOUS
  Filled 2012-01-06: qty 1

## 2012-01-06 MED ORDER — LACTATED RINGERS IV SOLN
INTRAVENOUS | Status: DC | PRN
  Administered 2012-01-06 (×2): via INTRAVENOUS

## 2012-01-06 MED ORDER — BUPIVACAINE LIPOSOME 1.3 % IJ SUSP
20.0000 mL | Freq: Once | INTRAMUSCULAR | Status: AC
  Administered 2012-01-06: 20 mL
  Filled 2012-01-06: qty 20

## 2012-01-06 MED ORDER — ONDANSETRON HCL 4 MG/2ML IJ SOLN
INTRAMUSCULAR | Status: DC | PRN
  Administered 2012-01-06: 4 mg via INTRAVENOUS

## 2012-01-06 MED ORDER — NALOXONE HCL 0.4 MG/ML IJ SOLN
INTRAMUSCULAR | Status: DC | PRN
  Administered 2012-01-06: .2 ug via INTRAVENOUS

## 2012-01-06 MED ORDER — LIDOCAINE HCL 1 % IJ SOLN
INTRAMUSCULAR | Status: DC | PRN
  Administered 2012-01-06: 40 mg via INTRADERMAL

## 2012-01-06 MED ORDER — LACTATED RINGERS IV SOLN
INTRAVENOUS | Status: DC
  Administered 2012-01-06: 1000 mL via INTRAVENOUS

## 2012-01-06 MED ORDER — ROCURONIUM BROMIDE 100 MG/10ML IV SOLN
INTRAVENOUS | Status: DC | PRN
  Administered 2012-01-06: 10 mg via INTRAVENOUS
  Administered 2012-01-06: 40 mg via INTRAVENOUS
  Administered 2012-01-06: 10 mg via INTRAVENOUS

## 2012-01-06 MED ORDER — OLMESARTAN MEDOXOMIL 20 MG PO TABS
20.0000 mg | ORAL_TABLET | Freq: Once | ORAL | Status: AC
  Administered 2012-01-07: 20 mg via ORAL
  Filled 2012-01-06: qty 1

## 2012-01-06 MED ORDER — MIDAZOLAM HCL 5 MG/5ML IJ SOLN
INTRAMUSCULAR | Status: DC | PRN
  Administered 2012-01-06: 2 mg via INTRAVENOUS

## 2012-01-06 MED ORDER — HYDROMORPHONE HCL PF 10 MG/ML IJ SOLN
0.5000 mg/h | INTRAMUSCULAR | Status: DC | PRN
  Filled 2012-01-06: qty 5

## 2012-01-06 MED ORDER — FENTANYL CITRATE 0.05 MG/ML IJ SOLN
25.0000 ug | INTRAMUSCULAR | Status: DC | PRN
  Administered 2012-01-06 (×2): 25 ug via INTRAVENOUS

## 2012-01-06 SURGICAL SUPPLY — 39 items
BLADE EXTENDED COATED 6.5IN (ELECTRODE) IMPLANT
BLADE HEX COATED 2.75 (ELECTRODE) ×2 IMPLANT
BRIEF STRETCH FOR OB PAD LRG (UNDERPADS AND DIAPERS) ×2 IMPLANT
CANISTER SUCTION 2500CC (MISCELLANEOUS) ×2 IMPLANT
CLOTH BEACON ORANGE TIMEOUT ST (SAFETY) ×2 IMPLANT
DECANTER SPIKE VIAL GLASS SM (MISCELLANEOUS) ×2 IMPLANT
DRAPE LAPAROTOMY T 102X78X121 (DRAPES) ×2 IMPLANT
DRSG PAD ABDOMINAL 8X10 ST (GAUZE/BANDAGES/DRESSINGS) IMPLANT
ELECT REM PT RETURN 9FT ADLT (ELECTROSURGICAL) ×2
ELECTRODE REM PT RTRN 9FT ADLT (ELECTROSURGICAL) ×1 IMPLANT
GAUZE SPONGE 4X4 16PLY XRAY LF (GAUZE/BANDAGES/DRESSINGS) ×4 IMPLANT
GLOVE ECLIPSE 8.0 STRL XLNG CF (GLOVE) ×2 IMPLANT
GLOVE INDICATOR 8.0 STRL GRN (GLOVE) ×4 IMPLANT
GOWN STRL NON-REIN LRG LVL3 (GOWN DISPOSABLE) ×2 IMPLANT
GOWN STRL REIN XL XLG (GOWN DISPOSABLE) ×4 IMPLANT
HEMOSTAT SURGICEL 4X8 (HEMOSTASIS) ×2 IMPLANT
KIT SLIDE ONE PROLAPS HEMORR (KITS) ×2 IMPLANT
LUBRICANT JELLY K Y 4OZ (MISCELLANEOUS) ×2 IMPLANT
NDL SAFETY ECLIPSE 18X1.5 (NEEDLE) ×1 IMPLANT
NEEDLE HYPO 18GX1.5 SHARP (NEEDLE) ×1
NEEDLE HYPO 22GX1.5 SAFETY (NEEDLE) IMPLANT
NS IRRIG 1000ML POUR BTL (IV SOLUTION) ×2 IMPLANT
PACK BASIC VI WITH GOWN DISP (CUSTOM PROCEDURE TRAY) IMPLANT
PACK LITHOTOMY IV (CUSTOM PROCEDURE TRAY) IMPLANT
PENCIL BUTTON HOLSTER BLD 10FT (ELECTRODE) ×2 IMPLANT
SPONGE GAUZE 4X4 12PLY (GAUZE/BANDAGES/DRESSINGS) ×2 IMPLANT
SPONGE HEMORRHOID 8X3CM (HEMOSTASIS) ×2 IMPLANT
SPONGE LAP 18X18 X RAY DECT (DISPOSABLE) ×2 IMPLANT
SPONGE SURGIFOAM ABS GEL 100 (HEMOSTASIS) IMPLANT
SUT CHROMIC 2 0 SH (SUTURE) IMPLANT
SUT CHROMIC 3 0 SH 27 (SUTURE) IMPLANT
SUT PROLENE 2 0 BLUE (SUTURE) ×2 IMPLANT
SUT VIC AB 2-0 UR6 27 (SUTURE) ×2 IMPLANT
SUT VIC AB 3-0 SH 18 (SUTURE) IMPLANT
SUT VIC AB 3-0 SH 27 (SUTURE)
SUT VIC AB 3-0 SH 27XBRD (SUTURE) IMPLANT
SYR 20CC LL (SYRINGE) ×2 IMPLANT
TOWEL OR 17X26 10 PK STRL BLUE (TOWEL DISPOSABLE) ×2 IMPLANT
YANKAUER SUCT BULB TIP 10FT TU (MISCELLANEOUS) ×2 IMPLANT

## 2012-01-06 NOTE — Interval H&P Note (Signed)
History and Physical Interval Note:  01/06/2012 11:33 AM  [name]  has presented today for surgery, with the diagnosis of Int Ext hemorrhoids with chronic prolapse & bleeding  The various methods of treatment have been discussed with the patient and family. After consideration of risks, benefits and other options for treatment, the patient has consented to  Procedure(s) (LRB): TRANSANAL HEMORRHOIDAL DEARTERIALIZATION (N/A) as a surgical intervention .  The patients' history has been reviewed, patient examined, no change in status, stable for surgery.  I have reviewed the patients' chart and labs.  Questions were answered to the patient's satisfaction.     [name]

## 2012-01-06 NOTE — Progress Notes (Signed)
Pt w c/o chest pain. VSS . No diaphoresis. Skin warm and dry. Call to Dr Council Mechanic

## 2012-01-06 NOTE — Op Note (Signed)
[name], QIN NO.:  [id number]  MEDICAL RECORD NO.:  [id number]  LOCATION:  [facility]                         FACILITY:  [facility]  PHYSICIAN:  Ardeth Sportsman, MD     DATE OF BIRTH:  10-Jul-1956  DATE OF PROCEDURE:  01/06/2012 DATE OF DISCHARGE:                              OPERATIVE REPORT   PRIMARY CARE PHYSICIAN:  Lehman Prom, FNP.  GASTROENTEROLOGIST:  Barbette Hair. Arlyce Dice, MD, Clementeen Graham.  SURGEON:  Ardeth Sportsman, MD.  ASSISTANT:  R.N.  PREOPERATIVE DIAGNOSIS:  Internal and external hemorrhoids with chronic prolapse and bleeding.  POSTOPERATIVE DIAGNOSIS:  Internal and external hemorrhoids with chronic prolapse and bleeding.  PROCEDURE PERFORMED: 1. Transanal hemorrhoidal dearterialization, hemorrhoidal ligation. 2. Hemorrhoidopexy. 3. External hemorrhoidectomy x2.  ANESTHESIA: 1. General anesthesia. 2. Bilateral anorectal block with liposomal bupivacaine.  SPECIMEN:  External hemorrhoids x2.  DRAINS:  None.  ESTIMATED BLOOD LOSS:  Minimal.  COMPLICATIONS:  None major.  INDICATIONS:  Mrs. [name] is a 56 year old morbidly obese female who struggled with chronic bleeding and hemorrhoids for many years.  She has worked on a bowel regimen with her gastroenterologist and is improved. She has been getting some iron-deficiency anemia.  Workup noted she had large hemorrhoids.  Recommendation made for removal since they were too large and sensitive to band.  TECHNIQUE OF REMOVAL:  Risks, benefits, alternatives discussed. Importance of a bowel regimen to help smooth recovery and minimize recurrence was discussed.  Questions answered.  She agreed to proceed.  OPERATIVE FINDINGS:  She had very large right-sided hemorrhoids, right posterior and right anterior greater than left lateral.  The right posterior external component was not particularly large.  There was no evidence of any other anorectal abnormalities.  DESCRIPTION OF PROCEDURE:  Informed  consent was confirmed.  Patient underwent general anesthesia without any difficulty.  She was positioned in prone position.  Her perineum was prepped and draped in sterile fashion.  Surgical time-out confirmed our plan.  She had a moderate amount of stool in her rectal vault, but I helped remove and just packed that.  I did go ahead and used a THD anoscope to identify the hemorrhoidal arteries and ligate them about 6-7 cm proximal to the anal verge.  I used then a figure-of-8 stitch and then ran that down more distally near to the anoderm and tied that down for hemorrhoidopexy.  I did that in 6 locations; posterior, lateral and anteriorly on the right and left sides.  I reinspected and noted some signals in the left anterior and left posterior aspects.  I ligated then with another figure-of-8 stitch and it resolved.  On inspection, the left lateral hemorrhoid tucked in well.  Right anterior, especially right posterior not as much.  Therefore, I used a cautery to remove the external hemorrhoidal tissues on the right posterior aspect and then in the anterior midline between the anus and introitus.  She had a lot of redundant skin suspicious for and kind of contiguous with the right anterior hemorrhoid.  I therefore, transitioned that off.  I cauterized the base.  Hemostasis was excellent.  Patient is being extubated and sent to recovery room in stable condition.  I discussed postop care with the patient in detail.     Ardeth Sportsman, MD     SCG/MEDQ  D:  01/06/2012  T:  01/06/2012  Job:  [id number]

## 2012-01-06 NOTE — Progress Notes (Signed)
Pt still attempting to void without success. Bladder scan for 575cc. Pt states she is very uncomfortable and says she feels like she needs to go and cannot. Ambulated around nursing unit. Patient very tired and "worn out". Called and spoke to Dr. Dwain Sarna and orders obtained. Pt to have foley inserted now and will spend the night at the hospital.

## 2012-01-06 NOTE — H&P (View-Only) (Signed)
Subjective:     Patient ID: [name], female   DOB: 06/13/1956, 55 y.o.   MRN: [id number]  HPI  [name]  06/11/1956 [id number]  Patient Care Team: Nykedtra Martin, NP as PCP - General Robert D Kaplan, MD as Consulting Physician (Gastroenterology)  This patient is a 55 y.o.female who presents today for surgical evaluation at the request of Dr. Kaplan.   Reason for visit: Bright red blood per rectum. Hemorrhoids with itching.  [name] is a morbidly obese female who struggled with hemorrhoids for several years. Normally constipated. Has had no bleeding. Sometimes it "pours out like water". She underwent upper and lower endoscopy. Dr. Kaplan saw no other major abnormalities aside from the hemorrhoids. He sent to us for have the patient consider surgery.  She is now taking prune juice and smoothly is to have her bowels be more regular. Her bleeding has gone down. However she does get burning and itching. She's never had any anorectal interventions.  Patient Active Problem List  Diagnoses  . HYPERLIPIDEMIA  . OBESITY  . SLEEP APNEA, OBSTRUCTIVE, MILD  . CERUMEN IMPACTION, BILATERAL  . OTITIS MEDIA, LEFT  . TINNITUS, LEFT  . HYPERTENSION  . EXTERNAL HEMORRHOIDS  . SINUSITIS  . ALLERGIC RHINITIS  . ASTHMA  . DENTAL PAIN  . GERD  . CONSTIPATION  . RECTAL BLEEDING  . HEMATURIA UNSPECIFIED  . POSTMENOPAUSAL SYNDROME  . Unspecified Pruritic Disorder  . Pain in joint, lower leg  . BUNIONS, BILATERAL  . Iron deficiency anemia, unspecified  . Hemorrhoids, internal, with bleeding & prolapse    Past Medical History  Diagnosis Date  . Seasonal allergies   . Asthma     History of  . Hypertension   . Iron deficiency 12-07-2011  . Osteoarthritis   . Myocardial infarction     unsure when  . Anemia   . GERD (gastroesophageal reflux disease)   . Heart murmur   . Osteoporosis     Past Surgical History  Procedure Date  . Knee surgery     Both knees  . Total knee  arthroplasty 04/29/2011    Left knee  . Multiple tooth extractions   . Wisdom tooth extraction   . Colonoscopy 2009  . Knee arthroscopy 1991    History   Social History  . Marital Status: Single    Spouse Name: N/A    Number of Children: 2  . Years of Education: N/A   Occupational History  . Retired    Social History Main Topics  . Smoking status: Never Smoker   . Smokeless tobacco: Not on file  . Alcohol Use: No  . Drug Use: No  . Sexually Active: Not on file   Other Topics Concern  . Not on file   Social History Narrative  . No narrative on file    Family History  Problem Relation Age of Onset  . Colon cancer Neg Hx   . Diabetes Mother   . Hypertension Mother   . Heart disease Sister     Congestive heart failure  . Diabetes type II Sister   . Kidney disease Brother     Current outpatient prescriptions:Acetaminophen 500 MG TBDP, Take by mouth. Take 2 tab as needed for joint pain., Disp: , Rfl: ;  cycloSPORINE (RESTASIS) 0.05 % ophthalmic emulsion, Place 1 drop into both eyes 2 (two) times daily., Disp: , Rfl: ;  esomeprazole (NEXIUM) 40 MG capsule, Take 40 mg by mouth daily before breakfast.,   Disp: , Rfl: ;  fluticasone (VERAMYST) 27.5 MCG/SPRAY nasal spray, Place 2 sprays into the nose daily., Disp: , Rfl:  hydrochlorothiazide (MICROZIDE) 12.5 MG capsule, Take 12.5 mg by mouth daily., Disp: , Rfl: ;  nebivolol (BYSTOLIC) 5 MG tablet, Take 5 mg by mouth daily., Disp: , Rfl: ;  pregabalin (LYRICA) 50 MG capsule, Take 50 mg by mouth 3 (three) times daily. Take 1 tab by mouth daily, Disp: , Rfl: ;  valsartan (DIOVAN) 160 MG tablet, Take 160 mg by mouth daily. AM & pm, Disp: , Rfl:   Allergies  Allergen Reactions  . Codeine   . Latex   . Oxycodone-Acetaminophen   . Penicillins     BP 156/84  Pulse 80  Temp(Src) 98.1 F (36.7 C) (Temporal)  Resp 20  Ht 5' 8" (1.727 m)  Wt 321 lb 12.8 oz (145.968 kg)  BMI 48.93 kg/m2     Review of Systems  Constitutional:  Negative for fever, chills, diaphoresis, appetite change and fatigue.  HENT: Negative for ear pain, sore throat, trouble swallowing, neck pain and ear discharge.   Eyes: Negative for photophobia, discharge and visual disturbance.  Respiratory: Negative for cough, choking, chest tightness and shortness of breath.   Cardiovascular: Negative for chest pain and palpitations.  Gastrointestinal: Positive for constipation and anal bleeding. Negative for nausea, vomiting, abdominal pain, diarrhea, blood in stool and rectal pain.       No personal nor family history of GI/colon cancer, inflammatory bowel disease, irritable bowel syndrome, allergy such as Celiac Sprue, dietary/dairy problems, colitis, ulcers nor gastritis.    Mild crampy abd pain occasionally - nonspecidfic  No recent sick contacts/gastroenteritis.  No travel outside the country.  No changes in diet.    BM q1-2d with prune juice & smoothies (o/w 2x/week)  Genitourinary: Negative for dysuria, frequency and difficulty urinating.  Musculoskeletal: Positive for back pain and arthralgias. Negative for myalgias and gait problem.  Skin: Negative for color change, pallor and rash.  Neurological: Negative for dizziness, speech difficulty, weakness and numbness.  Hematological: Negative for adenopathy.  Psychiatric/Behavioral: Negative for confusion and agitation. The patient is not nervous/anxious.        Objective:   Physical Exam  Constitutional: She is oriented to person, place, and time. She appears well-developed and well-nourished. No distress.  HENT:  Head: Normocephalic.  Mouth/Throat: Oropharynx is clear and moist. No oropharyngeal exudate.  Eyes: Conjunctivae and EOM are normal. Pupils are equal, round, and reactive to light. No scleral icterus.  Neck: Normal range of motion. Neck supple. No tracheal deviation present.  Cardiovascular: Normal rate, regular rhythm and intact distal pulses.   Pulmonary/Chest: Effort normal and  breath sounds normal. No respiratory distress. She exhibits no tenderness.  Abdominal: Soft. She exhibits no distension and no mass. There is no tenderness. Hernia confirmed negative in the right inguinal area and confirmed negative in the left inguinal area.       Morbidly obese  Genitourinary: No vaginal discharge found.       Perianal skin clean with good hygiene.  No pruritis.  No pilonidal disease.  No fissure.  No abscess/fistula.    R ant & post chronically prolapsed int/ext hemorrhoids, L lat int hemorrhoid as well.  Tolerates digital and anoscopic rectal exam.  Normal sphincter tone.   No rectal masses.     Musculoskeletal: Normal range of motion. She exhibits no tenderness.  Lymphadenopathy:    She has no cervical adenopathy.         Right: No inguinal adenopathy present.       Left: No inguinal adenopathy present.  Neurological: She is alert and oriented to person, place, and time. No cranial nerve deficit. She exhibits normal muscle tone. Coordination normal.  Skin: Skin is warm and dry. No rash noted. She is not diaphoretic. No erythema.  Psychiatric: She has a normal mood and affect. Her behavior is normal. Judgment and thought content normal.       Assessment:     Chronically prolapsed int/ext hemorrhoids with bleeding    Plan:     I think she will require a combination THD procedure with concurrent removal of persistent external hemorrhoids as well. I think since she is chronically prolapsed,  less aggressive interventions are not going to work. She agrees I did discuss the procedure with her  The anatomy & physiology of the anorectal region was discussed.  The pathophysiology of hemorrhoids and differential diagnosis was discussed.  Natural history risks without surgery was discussed.   I stressed the importance of a bowel regimen to have daily soft bowel movements to minimize progression of disease.  Interventions such as sclerotherapy & banding were discussed.  The  patient's symptoms are not adequately controlled by medicines and other non-operative treatments.  I feel the risks & problems of no surgery outweigh the operative risks; therefore, I recommended surgery to treat the hemorrhoids by ligation, pexy, and possible resection.  Risks such as bleeding, infection, need for further treatment, heart attack, death, and other risks were discussed.   I noted a good likelihood this will help address the problem.  Goals of post-operative recovery were discussed as well.  Possibility that this will not correct all symptoms was explained.  Post-operative pain, bleeding, constipation, and other problems after surgery were discussed.  We will work to minimize complications.   Educational handouts further explaining the pathology, treatment options, and bowel regimen were given as well.  Questions were answered.  The patient expresses understanding & wishes to proceed with surgery.        

## 2012-01-06 NOTE — Discharge Instructions (Signed)
ANORECTAL SURGERY: POST OP INSTRUCTIONS  1. Take your usually prescribed home medications unless otherwise directed. 2. DIET: Follow a light bland diet the first 24 hours after arrival home, such as soup, liquids, crackers, etc.  Be sure to include lots of fluids daily.  Avoid fast food or heavy meals as your are more likely to get nauseated.  Eat a low fat the next few days after surgery.   3. PAIN CONTROL: a. Pain is best controlled by a usual combination of three different methods TOGETHER: i. Ice/Heat ii. Over the counter pain medication iii. Prescription pain medication b. Most patients will experience some swelling and discomfort in the anus/rectal area. and incisions.  Ice packs or heat (30-60 minutes up to 6 times a day) will help. Use ice for the first few days to help decrease swelling and bruising, then switch to heat such as warm towels, sitz baths, warm baths, etc to help relax tight/sore spots and speed recovery.  Some people prefer to use ice alone, heat alone, alternating between ice & heat.  Experiment to what works for you.  Swelling and bruising can take several weeks to resolve.   c. It is helpful to take an over-the-counter pain medication regularly for the first few weeks.  Choose one of the following that works best for you: i. Naproxen (Aleve, etc)  Two 220mg tabs twice a day ii. Ibuprofen (Advil, etc) Three 200mg tabs four times a day (every meal & bedtime) iii. Acetaminophen (Tylenol, etc) 500-650mg four times a day (every meal & bedtime) d. A  prescription for pain medication (such as oxycodone, hydrocodone, etc) should be given to you upon discharge.  Take your pain medication as prescribed.  i. If you are having problems/concerns with the prescription medicine (does not control pain, nausea, vomiting, rash, itching, etc), please call us [phone] to see if we need to switch you to a different pain medicine that will work better for you and/or control your side effect  better. ii. If you need a refill on your pain medication, please contact your pharmacy.  They will contact our office to request authorization. Prescriptions will not be filled after 5 pm or on week-ends. 4. KEEP YOUR BOWELS REGULAR a. The goal is one bowel movement a day b. Avoid getting constipated.  Between the surgery and the pain medications, it is common to experience some constipation.  Increasing fluid intake and taking a fiber supplement (such as Metamucil, Citrucel, FiberCon, MiraLax, etc) 1-2 times a day regularly will usually help prevent this problem from occurring.  A mild laxative (prune juice, Milk of Magnesia, MiraLax, etc) should be taken according to package directions if there are no bowel movements after 48 hours. c. Watch out for diarrhea.  If you have many loose bowel movements, simplify your diet to bland foods & liquids for a few days.  Stop any stool softeners and decrease your fiber supplement.  Switching to mild anti-diarrheal medications (Kayopectate, Pepto Bismol) can help.  If this worsens or does not improve, please call us.  5. Wound Care a. Remove your bandages the day after surgery.  Unless discharge instructions indicate otherwise, leave your bandage dry and in place overnight.  Remove the bandage during your first bowel movement.   b. Allow the wound packing to fall out over the next few days.  You can trim exposed gauze / ribbon as it falls out.  You do not need to repack the wound unless instructed otherwise.  Wear an   absorbent pad or soft cotton gauze in your underwear as needed to catch any drainage and help keep the area  c. Keep the area clean and dry.  Bathe / shower every day.  Keep the area clean by showering / bathing over the incision / wound.   It is okay to soak an open wound to help wash it.  Wet wipes or showers / gentle washing after bowel movements is often less traumatic than regular toilet paper. d. You may have some styrofoam-like soft packing in  the rectum which will come out with the first bowel movement.  e. You will often notice bleeding with bowel movements.  This should slow down by the end of the first week of surgery f. Expect some drainage.  This should slow down, too, by the end of the first week of surgery.  Wear an absorbent pad or soft cotton gauze in your underwear until the drainage stops. 6. ACTIVITIES as tolerated:   a. You may resume regular (light) daily activities beginning the next day--such as daily self-care, walking, climbing stairs--gradually increasing activities as tolerated.  If you can walk 30 minutes without difficulty, it is safe to try more intense activity such as jogging, treadmill, bicycling, low-impact aerobics, swimming, etc. b. Save the most intensive and strenuous activity for last such as sit-ups, heavy lifting, contact sports, etc  Refrain from any heavy lifting or straining until you are off narcotics for pain control.   c. DO NOT PUSH THROUGH PAIN.  Let pain be your guide: If it hurts to do something, don't do it.  Pain is your body warning you to avoid that activity for another week until the pain goes down. d. You may drive when you are no longer taking prescription pain medication, you can comfortably sit for long periods of time, and you can safely maneuver your car and apply brakes. e. You may have sexual intercourse when it is comfortable.  7. FOLLOW UP in our office a. Please call [facility] at [phone] to set up an appointment to see your surgeon in the office for a follow-up appointment approximately 2 weeks after your surgery. b. Make sure that you call for this appointment the day you arrive home to insure a convenient appointment time. 10. IF YOU HAVE DISABILITY OR FAMILY LEAVE FORMS, BRING THEM TO THE OFFICE FOR PROCESSING.  DO NOT GIVE THEM TO YOUR DOCTOR.        WHEN TO CALL US [phone]: 1. Poor pain control 2. Reactions / problems with new medications (rash/itching, nausea,  etc)  3. Fever over 101.5 F (38.5 C) 4. Inability to urinate 5. Nausea and/or vomiting 6. Worsening swelling or bruising 7. Continued bleeding from incision. 8. Increased pain, redness, or drainage from the incision  The clinic staff is available to answer your questions during regular business hours (8:30am-5pm).  Please don't hesitate to call and ask to speak to one of our nurses for clinical concerns.   A surgeon from [facility] is always on call at the hospitals   If you have a medical emergency, go to the nearest emergency room or call 911.    [facility] [address], [address], [place], [place]  [address] ? MAIN: [phone] ? TOLL FREE: [phone] ? FAX [phone] [email or web address]    

## 2012-01-06 NOTE — Brief Op Note (Signed)
01/06/2012  2:06 PM  PATIENT:  [name]  56 y.o. female  Patient Care Team: Lehman Prom, NP as PCP - General Louis Meckel, MD as Consulting Physician (Gastroenterology)  PRE-OPERATIVE DIAGNOSIS:  Internal and external hemorrhoids with chronic prolapse & bleeding  POST-OPERATIVE DIAGNOSIS:  Internal and external hemorrhoids with chronic prolapse & bleeding  PROCEDURE:  Procedure(s) (LRB): TRANSANAL HEMORRHOIDAL DEARTERIALIZATION (N/A) EXT HEMORRHOIDECTOMY X 2  SURGEON:  Surgeon(s) and Role:    * Ardeth Sportsman, MD - Primary  PHYSICIAN ASSISTANT:   ASSISTANTS: none   ANESTHESIA:   local and general  EBL:  Total I/O In: 1000 [I.V.:1000] Out: -   BLOOD ADMINISTERED:none  DRAINS: none   LOCAL MEDICATIONS USED:  OTHER Liposomal bupivicaine  SPECIMEN:  Source of Specimen:  Ext hemorrhoids  DISPOSITION OF SPECIMEN:  PATHOLOGY  COUNTS:  YES  TOURNIQUET:  * No tourniquets in log *  DICTATION: .Other Dictation: Dictation Number [id number]  PLAN OF CARE: Discharge to home after PACU  PATIENT DISPOSITION:  PACU - hemodynamically stable.   Delay start of Pharmacological VTE agent (>24hrs) due to surgical blood loss or risk of bleeding: not applicable

## 2012-01-06 NOTE — Progress Notes (Signed)
Pt up to bathroom three times since arrival from PACU at 1535. Very uncomfortable feeling like she has to urinate but unable to. Bladder scan for 300cc. Some relief with Dilaudid pain med given at 1700 but still feels pressure and the need to urinate. Called MD and orders received. Dr. Dwain Sarna stated to give her another 0.5 mg of Dilaudid now and have patient rest. If patient has not voided in another hour and is still uncomfortable to call him back.

## 2012-01-06 NOTE — Transfer of Care (Signed)
Immediate Anesthesia Transfer of Care Note  Patient: [name]  Procedure(s) Performed: Procedure(s) (LRB): TRANSANAL HEMORRHOIDAL DEARTERIALIZATION (N/A)  Patient Location: PACU  Anesthesia Type: General  Level of Consciousness: oriented, sedated and patient cooperative  Airway & Oxygen Therapy: Patient Spontanous Breathing and Patient connected to face mask oxygen  Post-op Assessment: Report given to PACU RN, Post -op Vital signs reviewed and stable and Patient moving all extremities  Post vital signs: Reviewed and stable  Complications: No apparent anesthesia complications

## 2012-01-06 NOTE — Progress Notes (Signed)
Patient stated pain is very severe (20 out of a 1-10 scale per patient). Was given pain med in PACU and have a script for d/c but no pain med for post-op here in Short Stay. Dr. Michaell Cowing paged and order received. Will monitor patient closely while and during pain medicine administration.

## 2012-01-06 NOTE — Progress Notes (Signed)
Spoke w Dr Council Mechanic , pain is relieved

## 2012-01-06 NOTE — Anesthesia Preprocedure Evaluation (Addendum)
Anesthesia Evaluation  Patient identified by MRN, date of birth, ID band Patient awake    Reviewed: Allergy & Precautions, H&P , NPO status , Patient's Chart, lab work & pertinent test results  History of Anesthesia Complications (+) PONV  Airway Mallampati: II TM Distance: >3 FB Neck ROM: Full    Dental   Chipped and missing teeth.:   Pulmonary asthma , sleep apnea ,  clear to auscultation  Pulmonary exam normal       Cardiovascular hypertension, Pt. on home beta blockers and Pt. on medications + Past MI + dysrhythmias + Valvular Problems/Murmurs Regular Normal    Neuro/Psych  Headaches, Negative Psych ROS   GI/Hepatic Neg liver ROS, GERD-  ,  Endo/Other  Negative Endocrine ROS  Renal/GU negative Renal ROS  Genitourinary negative   Musculoskeletal negative musculoskeletal ROS (+)   Abdominal   Peds negative pediatric ROS (+)  Hematology negative hematology ROS (+)   Anesthesia Other Findings   Reproductive/Obstetrics negative OB ROS                          Anesthesia Physical Anesthesia Plan  ASA: III  Anesthesia Plan: General   Post-op Pain Management:    Induction: Intravenous  Airway Management Planned: LMA  Additional Equipment:   Intra-op Plan:   Post-operative Plan: Extubation in OR  Informed Consent: I have reviewed the patients History and Physical, chart, labs and discussed the procedure including the risks, benefits and alternatives for the proposed anesthesia with the patient or authorized representative who has indicated his/her understanding and acceptance.   Dental advisory given  Plan Discussed with: CRNA  Anesthesia Plan Comments:         Anesthesia Quick Evaluation

## 2012-01-06 NOTE — Progress Notes (Signed)
Foley inserted. Questioned patient about Latex allergy and she stated it was "a long time ago" and near an incision that her MD thought it may have been the prep used pre-op and not a true latex sensitivity. Instructed patient to alert staff if any itching occurs post catheter placement. She feels much more comfortable now with catheter inserted and has had 500cc amber urine returned. Called report to Evangeline on 6E and patient going to room 1606.

## 2012-01-06 NOTE — Anesthesia Postprocedure Evaluation (Signed)
  Anesthesia Post-op Note  Patient: [name]  Procedure(s) Performed: Procedure(s) (LRB): TRANSANAL HEMORRHOIDAL DEARTERIALIZATION (N/A)  Patient Location: PACU  Anesthesia Type: General  Level of Consciousness: awake and alert   Airway and Oxygen Therapy: Patient Spontanous Breathing  Post-op Pain: mild  Post-op Assessment: Post-op Vital signs reviewed, Patient's Cardiovascular Status Stable, Respiratory Function Stable, Patent Airway and No signs of Nausea or vomiting  Post-op Vital Signs: stable  Complications: No apparent anesthesia complications

## 2012-01-07 ENCOUNTER — Encounter (HOSPITAL_COMMUNITY): Payer: Self-pay | Admitting: *Deleted

## 2012-01-07 DIAGNOSIS — G8929 Other chronic pain: Secondary | ICD-10-CM

## 2012-01-07 DIAGNOSIS — R338 Other retention of urine: Secondary | ICD-10-CM | POA: Diagnosis not present

## 2012-01-07 HISTORY — DX: Other retention of urine: R33.8

## 2012-01-07 MED ORDER — PSYLLIUM 95 % PO PACK
1.0000 | PACK | Freq: Two times a day (BID) | ORAL | Status: DC
  Administered 2012-01-07 – 2012-01-10 (×7): 1 via ORAL
  Filled 2012-01-07 (×8): qty 1

## 2012-01-07 MED ORDER — ADULT MULTIVITAMIN W/MINERALS CH
0.5000 | ORAL_TABLET | ORAL | Status: DC
  Administered 2012-01-07 – 2012-01-09 (×2): 1 via ORAL
  Filled 2012-01-07 (×3): qty 1

## 2012-01-07 MED ORDER — ACETAMINOPHEN 500 MG PO TABS
500.0000 mg | ORAL_TABLET | Freq: Four times a day (QID) | ORAL | Status: DC | PRN
  Administered 2012-01-10 (×2): 1000 mg via ORAL
  Filled 2012-01-07 (×2): qty 2

## 2012-01-07 MED ORDER — CELECOXIB 200 MG PO CAPS
200.0000 mg | ORAL_CAPSULE | Freq: Two times a day (BID) | ORAL | Status: DC
  Administered 2012-01-07 – 2012-01-08 (×3): 200 mg via ORAL
  Filled 2012-01-07 (×10): qty 1

## 2012-01-07 MED ORDER — FENTANYL CITRATE 0.05 MG/ML IJ SOLN: 25.0000 ug | INTRAMUSCULAR | Status: DC | PRN

## 2012-01-07 MED ORDER — OLMESARTAN MEDOXOMIL 20 MG PO TABS
20.0000 mg | ORAL_TABLET | Freq: Every day | ORAL | Status: DC
  Administered 2012-01-07 – 2012-01-10 (×4): 20 mg via ORAL
  Filled 2012-01-07 (×5): qty 1

## 2012-01-07 MED ORDER — HYDROMORPHONE HCL PF 1 MG/ML IJ SOLN
1.0000 mg | INTRAMUSCULAR | Status: DC | PRN
  Administered 2012-01-07 (×5): 1 mg via INTRAVENOUS
  Filled 2012-01-07 (×5): qty 1

## 2012-01-07 MED ORDER — ACETAMINOPHEN 650 MG RE SUPP: 650.0000 mg | Freq: Four times a day (QID) | RECTAL | Status: DC | PRN

## 2012-01-07 MED ORDER — SODIUM CHLORIDE 0.9 % IJ SOLN
3.0000 mL | Freq: Two times a day (BID) | INTRAMUSCULAR | Status: DC
  Administered 2012-01-07: 3 mL via INTRAVENOUS

## 2012-01-07 MED ORDER — ACETAMINOPHEN 650 MG RE SUPP: 650.0000 mg | RECTAL | Status: DC | PRN

## 2012-01-07 MED ORDER — HYDROMORPHONE HCL PF 1 MG/ML IJ SOLN
0.5000 mg | INTRAMUSCULAR | Status: DC | PRN
  Administered 2012-01-07: 1 mg via INTRAVENOUS
  Filled 2012-01-07: qty 1

## 2012-01-07 MED ORDER — PREGABALIN 50 MG PO CAPS
100.0000 mg | ORAL_CAPSULE | Freq: Every day | ORAL | Status: DC
  Administered 2012-01-07 – 2012-01-09 (×3): 100 mg via ORAL
  Administered 2012-01-10: 50 mg via ORAL
  Filled 2012-01-07 (×3): qty 2
  Filled 2012-01-07: qty 1

## 2012-01-07 MED ORDER — PROMETHAZINE HCL 25 MG/ML IJ SOLN
12.5000 mg | Freq: Four times a day (QID) | INTRAMUSCULAR | Status: DC | PRN
  Administered 2012-01-07: 12.5 mg via INTRAVENOUS
  Filled 2012-01-07: qty 1

## 2012-01-07 MED ORDER — ONDANSETRON HCL 4 MG/2ML IJ SOLN: 4.0000 mg | Freq: Four times a day (QID) | INTRAMUSCULAR | Status: DC | PRN

## 2012-01-07 MED ORDER — DIPHENHYDRAMINE HCL 25 MG PO CAPS
25.0000 mg | ORAL_CAPSULE | Freq: Four times a day (QID) | ORAL | Status: DC | PRN
  Administered 2012-01-07 – 2012-01-09 (×4): 25 mg via ORAL
  Filled 2012-01-07 (×4): qty 1

## 2012-01-07 MED ORDER — SODIUM CHLORIDE 0.9 % IJ SOLN: 3.0000 mL | INTRAMUSCULAR | Status: DC | PRN

## 2012-01-07 MED ORDER — NEBIVOLOL HCL 5 MG PO TABS
5.0000 mg | ORAL_TABLET | Freq: Every day | ORAL | Status: DC
  Administered 2012-01-07 – 2012-01-09 (×3): 5 mg via ORAL
  Filled 2012-01-07 (×5): qty 1

## 2012-01-07 MED ORDER — HYDROMORPHONE HCL 2 MG PO TABS
2.0000 mg | ORAL_TABLET | ORAL | Status: DC | PRN
  Administered 2012-01-07: 4 mg via ORAL
  Administered 2012-01-07: 2 mg via ORAL
  Administered 2012-01-07 – 2012-01-10 (×8): 4 mg via ORAL
  Filled 2012-01-07 (×5): qty 2
  Filled 2012-01-07: qty 1
  Filled 2012-01-07 (×4): qty 2

## 2012-01-07 MED ORDER — PREGABALIN 50 MG PO CAPS: 50.0000 mg | ORAL_CAPSULE | Freq: Every day | ORAL | Status: DC

## 2012-01-07 MED ORDER — ACETAMINOPHEN 325 MG PO TABS
650.0000 mg | ORAL_TABLET | Freq: Four times a day (QID) | ORAL | Status: DC | PRN
  Filled 2012-01-07: qty 2

## 2012-01-07 MED ORDER — FLUTICASONE PROPIONATE 50 MCG/ACT NA SUSP
2.0000 | Freq: Every day | NASAL | Status: DC
  Administered 2012-01-07 – 2012-01-09 (×3): 2 via NASAL
  Filled 2012-01-07: qty 16

## 2012-01-07 MED ORDER — PNEUMOCOCCAL VAC POLYVALENT 25 MCG/0.5ML IJ INJ
0.5000 mL | INJECTION | Freq: Once | INTRAMUSCULAR | Status: AC
  Administered 2012-01-07: 0.5 mL via INTRAMUSCULAR
  Filled 2012-01-07: qty 0.5

## 2012-01-07 MED ORDER — INFLUENZA VIRUS VACC SPLIT PF IM SUSP
0.5000 mL | Freq: Once | INTRAMUSCULAR | Status: AC
  Administered 2012-01-07: 0.5 mL via INTRAMUSCULAR
  Filled 2012-01-07: qty 0.5

## 2012-01-07 MED ORDER — WITCH HAZEL-GLYCERIN EX PADS
1.0000 "application " | MEDICATED_PAD | CUTANEOUS | Status: DC | PRN
  Administered 2012-01-10: 1 via TOPICAL
  Filled 2012-01-07: qty 100

## 2012-01-07 MED ORDER — NAPROXEN 500 MG PO TABS
500.0000 mg | ORAL_TABLET | Freq: Two times a day (BID) | ORAL | Status: DC
  Filled 2012-01-07: qty 1

## 2012-01-07 MED ORDER — PANTOPRAZOLE SODIUM 40 MG PO TBEC
80.0000 mg | DELAYED_RELEASE_TABLET | Freq: Every day | ORAL | Status: DC
  Administered 2012-01-07 – 2012-01-10 (×4): 80 mg via ORAL
  Filled 2012-01-07 (×5): qty 2

## 2012-01-07 MED ORDER — SODIUM CHLORIDE 0.9 % IV SOLN: 250.0000 mL | INTRAVENOUS | Status: DC | PRN

## 2012-01-07 MED ORDER — CYCLOSPORINE 0.05 % OP EMUL
1.0000 [drp] | Freq: Two times a day (BID) | OPHTHALMIC | Status: DC
  Administered 2012-01-07 – 2012-01-09 (×5): 1 [drp] via OPHTHALMIC
  Administered 2012-01-09: 22:00:00 via OPHTHALMIC
  Administered 2012-01-10: 1 [drp] via OPHTHALMIC
  Filled 2012-01-07 (×10): qty 1

## 2012-01-07 MED ORDER — DIPHENHYDRAMINE HCL 50 MG/ML IJ SOLN: 12.5000 mg | Freq: Four times a day (QID) | INTRAMUSCULAR | Status: DC | PRN

## 2012-01-07 MED ORDER — HYDROCHLOROTHIAZIDE 12.5 MG PO CAPS
12.5000 mg | ORAL_CAPSULE | ORAL | Status: DC
  Administered 2012-01-07 – 2012-01-10 (×2): 12.5 mg via ORAL
  Filled 2012-01-07 (×2): qty 1

## 2012-01-07 MED ORDER — HYDROCORTISONE ACE-PRAMOXINE 2.5-1 % RE CREA
1.0000 "application " | TOPICAL_CREAM | Freq: Four times a day (QID) | RECTAL | Status: DC
  Administered 2012-01-07 – 2012-01-10 (×12): 1 via RECTAL
  Filled 2012-01-07: qty 30

## 2012-01-07 MED ORDER — ALBUTEROL SULFATE HFA 108 (90 BASE) MCG/ACT IN AERS: 2.0000 | INHALATION_SPRAY | Freq: Four times a day (QID) | RESPIRATORY_TRACT | Status: DC | PRN

## 2012-01-07 NOTE — Progress Notes (Signed)
[name] July 16, 1956 [id number]  PCP: Tracy Prom, NP, NP Outpatient Care Team: Patient Care Team: Tracy Prom, NP as PCP - General Tracy Meckel, MD as Consulting Physician (Gastroenterology)  Inpatient Treatment Team: Treatment Team: Attending Provider: Ardeth Sportsman, MD  Subjective:  Feeling better  Pressure relieved w Foley Likes the IV pain meds Not walking much  Objective:  Vital signs:  Temp:  [98.4 F (36.9 C)-99 F (37.2 C)] 98.9 F (37.2 C) (02/15 0447) Pulse Rate:  [77-109] 95  (02/15 0447) Resp:  [15-20] 18  (02/15 0447) BP: (116-193)/(68-86) 116/68 mmHg (02/15 0447) SpO2:  [93 %-100 %] 95 % (02/15 0447) Weight:  [333 lb 8.9 oz (151.3 kg)] 333 lb 8.9 oz (151.3 kg) (02/14 2039) Last BM Date: 01/06/12  Intake/Output   Yesterday:  02/14 0701 - 02/15 0700 In: 2770 [P.O.:720; I.V.:2050] Out: 1370 [Urine:1350; Blood:20] This shift:  Total I/O In: 240 [P.O.:240] Out: -   Bowel function:  Flatus: Y  BM: N  Physical Exam:  General: Pt awake/alert/oriented x4 in no acute distress Eyes: PERRL, normal EOM.  Sclera clear.  No icterus Neuro: CN II-XII intact w/o focal sensory/motor deficits. Lymph: No head/neck/groin lymphadenopathy Psych:  No delerium/psychosis/paranoia HENT: Normocephalic, Mucus membranes moist.  No thrush Neck: Supple, No tracheal deviation Chest: Clear.   No chest wall pain w good excursion CV:  Pulses intact.  Regular rhythm Abdomen: Soft, Nontender/Nondistended.  No incarcerated hernias. Ext:  SCDs BLE.  No mjr edema.  No cyanosis Skin: No petechiae / purpurae  Results:   Labs: No results found for this or any previous visit (from the past 48 hour(s)).  Imaging / Studies: No results found.  Medications / Allergies: per chart  Antibiotics: Anti-infectives    None      Assessment  [name]  56 y.o. female  1 Day Post-Op  Procedure(s): TRANSANAL HEMORRHOIDAL DEARTERIALIZATION  Problem  List:  Principal Problem:  *Hemorrhoids, internal, with bleeding & prolapse Active Problems:  OBESITY  HYPERTENSION  CONSTIPATION  Iron deficiency anemia, unspecified  Chronic pain  Acute urinary retention s/p Foley   Stabilizing   Plan: -Transition to PO pain control -chronic pain regimen -bowel regimen -poss D/C Foley if walking more -VTE prophylaxis- SCDs, etc -mobilize as tolerated to help recovery  Poss D/C LATER TODAY IF BETTER  [name], M.D., F.A.C.S. Gastrointestinal and Minimally Invasive Surgery [facility] [address], [address] [place], [place] [address] [phone] Main / Paging [phone] Voice Mail   01/07/2012

## 2012-01-07 NOTE — Progress Notes (Signed)
Pt walked in hallway for first time today.  Will d/c foley soon.  Pt states she is going to walk again soon.

## 2012-01-08 MED ORDER — POLYETHYLENE GLYCOL 3350 17 G PO PACK
17.0000 g | PACK | Freq: Once | ORAL | Status: AC
  Administered 2012-01-08: 17 g via ORAL
  Filled 2012-01-08: qty 1

## 2012-01-08 NOTE — Discharge Summary (Addendum)
Patient ID: [name] [id number] 55 y.o. December 04, 1955  01/06/2012  Discharge date and time:   Admitting Physician: Karie Soda, MD  Discharge Physician: Karie Soda, MD  Admission Diagnoses: Int,Ext hemorrhoids with chronic prolapse  bleeding  Discharge Diagnoses: same  Operations: Procedure(s): TRANSANAL HEMORRHOIDAL DEARTERIALIZATION  Admission Condition: good  Discharged Condition: good  Indication for Admission:  [name] is a morbidly obese female who struggled with hemorrhoids for several years. Normally constipated. Has had no bleeding. Sometimes it "pours out like water". She underwent upper and lower endoscopy. Dr. Arlyce Dice saw no other major abnormalities aside from the hemorrhoids.   She is now taking prune juice and smoothly is to have her bowels be more regular. Her bleeding has gone down. However she does get burning and itching. She's never had any anorectal interventions. On exam she has chronically prolapsed int/ext hemorrhoids with bleeding   Hospital Course: on the day of admission the patient was taken to the operating room and underwent transanal hemorrhoidal dearterialization and hemorrhoidal ligation, hemorrhoidal pexy and external hemorrhoidectomy x2.postop she was stable but had problems with pain control and urinary retention. That improved during the hospital course. Her Foley catheter was removed one day prior to discharge and she voided uneventfully. She resumed normal diet and was able to cannulate the halls by postop day 2. She was given a MiraLAX on Feb. 16.  The patient was unable to be discharged on February 16 because she went into urinary retention. A Foley catheter was inserted and she had 1000 cc in her bladder. She also has not had a bowel movement despite 2 doses of MiraLAX. We elected to cancel her discharge.  On the day of discharge she looked fine. Perianal tissues looked good. She wanted to go home.  She has chronic pain issues and has  numerous pain medications at home and so she was not giving any further prescriptions  She was asked to return to see Dr. Michaell Cowing in 3 weeks.  Consults: None  Significant Diagnostic Studies: none  Treatments: surgery: hemorrhoid ligation, hemorrhoidectomy.  Disposition: Home  Patient Instructions:   Adaleen, Hulgan  Home Medication Instructions ION:[id number]   Printed on:01/08/12 2440  Medication Information                    valsartan (DIOVAN) 160 MG tablet Take 160 mg by mouth 2 (two) times daily.            pregabalin (LYRICA) 50 MG capsule Take 50 mg by mouth daily.            esomeprazole (NEXIUM) 40 MG capsule Take 40 mg by mouth daily before breakfast.           hydrochlorothiazide (MICROZIDE) 12.5 MG capsule Take 12.5 mg by mouth 3 (three) times a week.            cycloSPORINE (RESTASIS) 0.05 % ophthalmic emulsion Place 1 drop into both eyes 2 (two) times daily.           nebivolol (BYSTOLIC) 5 MG tablet Take 5 mg by mouth at bedtime.            fluticasone (VERAMYST) 27.5 MCG/SPRAY nasal spray Place 2 sprays into the nose daily.           fluocinolone (DERMA-SMOOTHE) 0.01 % external oil Apply 1-2 drops topically 2 (two) times daily as needed. Itching            diclofenac sodium (VOLTAREN) 1 % GEL Apply  1 application topically 2 (two) times daily as needed. Pain            acetaminophen (TYLENOL) 500 MG tablet Take 1,000 mg by mouth every 6 (six) hours as needed. Pain            albuterol (PROVENTIL HFA;VENTOLIN HFA) 108 (90 BASE) MCG/ACT inhaler Inhale 2 puffs into the lungs every 6 (six) hours as needed. Wheezing            Multiple Vitamin (MULITIVITAMIN WITH MINERALS) TABS Take 0.5 tablets by mouth every other day.           vitamin B-12 (CYANOCOBALAMIN) 500 MCG tablet Take 500 mcg by mouth daily.           Propylene Glycol (SYSTANE BALANCE OP) Apply to eye daily.           celecoxib (CELEBREX) 100 MG capsule Take 200 mg by mouth 2 (two)  times daily. Pt only takes prn           traMADol (ULTRAM) 50 MG tablet Take 1-2 tablets (50-100 mg total) by mouth every 6 (six) hours as needed for pain.             Activity: activity as tolerated Diet: cardiac diet Wound Care: as directed  Follow-up:  With Dr. Michaell Cowing in 3 weeks.  Signed: Angelia Mould. Derrell Lolling, M.D., FACS General and minimally invasive surgery Breast and Colorectal Surgery  01/08/2012, 8:12 AM

## 2012-01-08 NOTE — Progress Notes (Signed)
2 Days Post-Op  Subjective: Foley is out and she is able to. She is eating very well. Passing flatus but no stool. Pain under control.  She has chronic pain measure issues. She has hydrocodone, oxycodone, Celebrex at home.  She wants to go home today.  Objective: Vital signs in last 24 hours: Temp:  [97.9 F (36.6 C)-98.3 F (36.8 C)] 98 F (36.7 C) (02/16 0557) Pulse Rate:  [86-89] 89  (02/16 0557) Resp:  [16] 16  (02/16 0557) BP: (112-166)/(74-102) 134/74 mmHg (02/16 0557) SpO2:  [91 %-95 %] 94 % (02/16 0557) Last BM Date: 01/06/12  Intake/Output from previous day: 02/15 0701 - 02/16 0700 In: 1158.3 [P.O.:840; I.V.:318.3] Out: 1275 [Urine:1275] Intake/Output this shift:    General appearance: alert. Ambulatory. In no distress. Moving about the room without difficulty. Rectal: Perianal wounds looked fine. No bleeding or clots. No infection. No swelling.  Lab Results:  No results found for this or any previous visit (from the past 24 hour(s)).   Studies/Results: @RISRSLT24 @     . celecoxib  200 mg Oral BID  . cycloSPORINE  1 drop Both Eyes BID  . fluticasone  2 spray Each Nare Daily  . hydrochlorothiazide  12.5 mg Oral 3 times weekly  . hydrocortisone-pramoxine  1 application Rectal QID  . influenza  inactive virus vaccine  0.5 mL Intramuscular Once  . mulitivitamin with minerals  1 tablet Oral QODAY  . nebivolol  5 mg Oral QHS  . olmesartan  20 mg Oral Daily  . pantoprazole  80 mg Oral Q1200  . pneumococcal 23 valent vaccine  0.5 mL Intramuscular Once  . pregabalin  100 mg Oral Daily  . psyllium  1 packet Oral BID  . sodium chloride  3 mL Intravenous Q12H  . DISCONTD: naproxen  500 mg Oral BID WC  . DISCONTD: pregabalin  50 mg Oral Daily     Assessment/Plan: s/p Procedure(s): TRANSANAL HEMORRHOIDAL DEARTERIALIZATION Doing well and ready for discharge today. Will give MiraLAX this morning.  No new prescriptions required as she has numerous pain  medications at home. Followup with Dr. Michaell Cowing in 3 weeks.   LOS: 2 days    Armaan Pond M 01/08/2012  . .prob

## 2012-01-08 NOTE — Progress Notes (Signed)
Dr. Daphine Deutscher notified that pt has been voiding small amounts of urine throught the day.  Last void was 100 ccs post void bladder scan read <999.  New order to insert foley.

## 2012-01-09 MED ORDER — POLYETHYLENE GLYCOL 3350 17 G PO PACK
17.0000 g | PACK | Freq: Once | ORAL | Status: AC
  Administered 2012-01-09: 17 g via ORAL
  Filled 2012-01-09: qty 1

## 2012-01-09 MED ORDER — POLYETHYLENE GLYCOL 3350 17 G PO PACK
17.0000 g | PACK | Freq: Two times a day (BID) | ORAL | Status: DC
  Administered 2012-01-09: 17 g via ORAL
  Filled 2012-01-09 (×2): qty 1

## 2012-01-09 MED ORDER — POLYETHYLENE GLYCOL 3350 17 G PO PACK
17.0000 g | PACK | Freq: Two times a day (BID) | ORAL | Status: DC
  Administered 2012-01-09 – 2012-01-10 (×3): 17 g via ORAL
  Filled 2012-01-09 (×4): qty 1

## 2012-01-09 MED ORDER — POLYETHYLENE GLYCOL 3350 17 G PO PACK
17.0000 g | PACK | Freq: Two times a day (BID) | ORAL | Status: DC
  Filled 2012-01-09 (×2): qty 1

## 2012-01-09 MED ORDER — POLYETHYLENE GLYCOL 3350 17 G PO PACK
17.0000 g | PACK | ORAL | Status: DC
  Filled 2012-01-09 (×2): qty 1

## 2012-01-09 NOTE — Progress Notes (Signed)
Pt refused foley insertion until now.  Pt last voided 250ccs and bladder scan .

## 2012-01-09 NOTE — Progress Notes (Signed)
3 Days Post-Op  Subjective: The patient's discharge was canceled because she had not had a bowel movement and she went back into urinary retention. A Foley catheter was inserted and she had 1000 cc of urine the bladder. She feels much better now. She is tolerating a diet and passing some flatus but has not had a bowel movement. She is not having much pain and only a small amount of bleeding.  Objective: Vital signs in last 24 hours: Temp:  [97.8 F (36.6 C)-98.5 F (36.9 C)] 98.5 F (36.9 C) (02/17 0443) Pulse Rate:  [74-79] 74  (02/17 0443) Resp:  [16] 16  (02/17 0443) BP: (112-137)/(73-84) 112/73 mmHg (02/17 0443) SpO2:  [93 %-100 %] 93 % (02/17 0443) Last BM Date: 01/06/12  Intake/Output from previous day: 02/16 0701 - 02/17 0700 In: 720 [P.O.:720] Out: 4875 [Urine:4875] Intake/Output this shift:    General appearance: alert GI: aabdomen is morbidly obese. Soft. Nontender.  Lab Results:  No results found for this or any previous visit (from the past 24 hour(s)).   Studies/Results: @RISRSLT24 @     . celecoxib  200 mg Oral BID  . cycloSPORINE  1 drop Both Eyes BID  . fluticasone  2 spray Each Nare Daily  . hydrochlorothiazide  12.5 mg Oral 3 times weekly  . hydrocortisone-pramoxine  1 application Rectal QID  . mulitivitamin with minerals  1 tablet Oral QODAY  . nebivolol  5 mg Oral QHS  . olmesartan  20 mg Oral Daily  . pantoprazole  80 mg Oral Q1200  . polyethylene glycol  17 g Oral BID  . pregabalin  100 mg Oral Daily  . psyllium  1 packet Oral BID  . sodium chloride  3 mL Intravenous Q12H     Assessment/Plan: s/p Procedure(s): TRANSANAL HEMORRHOIDAL DEARTERIALIZATION  Postop urinary retention. Foley catheter reinserted. We will be to leave the Foley catheter for sternal days. Probably she will need to be discharged with the catheter in place.  Postop hemorrhoidectomy. Making good progress but no bowel movement yet. We are rule out to discharge her home  without a bowel movement because of the risk of fecal impaction. We will give more MiraLAX today.  Hopefully she can be discharged tomorrow.    LOS: 3 days    Takahiro Godinho M 01/09/2012  . .prob

## 2012-01-09 NOTE — Progress Notes (Signed)
Pt complaining re: trying to have BM, feels 'something's not right...feels like too much packing is in there.' Pt has been walking in the hall numerous times, taking po fluids and food well, & has had meds per order. Is also concerned re: her cardiac hx.  Dr Derrell Lolling notified of all concerns & order received.  [name]]

## 2012-01-09 NOTE — Progress Notes (Signed)
Foley cath inserted with 1050ccs out.

## 2012-01-10 ENCOUNTER — Telehealth (INDEPENDENT_AMBULATORY_CARE_PROVIDER_SITE_OTHER): Payer: Self-pay

## 2012-01-10 MED ORDER — DICLOFENAC SODIUM 1 % TD GEL
1.0000 "application " | Freq: Two times a day (BID) | TRANSDERMAL | Status: DC | PRN
  Administered 2012-01-10: 1 via TOPICAL
  Filled 2012-01-10: qty 100

## 2012-01-10 MED ORDER — MAGNESIUM HYDROXIDE 400 MG/5ML PO SUSP
30.0000 mL | Freq: Three times a day (TID) | ORAL | Status: DC | PRN
Start: 2012-01-10 — End: 2012-01-10

## 2012-01-10 MED ORDER — MAGNESIUM HYDROXIDE 400 MG/5ML PO SUSP: 30.0000 mL | Freq: Once | ORAL | Status: DC

## 2012-01-10 MED ORDER — VITAMIN B-12 500 MCG PO TABS
500.0000 ug | ORAL_TABLET | Freq: Every day | ORAL | Status: DC
  Administered 2012-01-10: 500 ug via ORAL
  Filled 2012-01-10 (×2): qty 1

## 2012-01-10 MED ORDER — POLYETHYLENE GLYCOL 3350 17 G PO PACK
34.0000 g | PACK | Freq: Two times a day (BID) | ORAL | Status: DC
Start: 2012-01-10 — End: 2012-01-10
  Filled 2012-01-10 (×3): qty 2

## 2012-01-10 NOTE — Progress Notes (Signed)
[name] [birth year] [id number]  PCP: Lehman Prom NP, NP Outpatient Care Team: Patient Care Team: Lehman Prom, NP as PCP - General Louis Meckel, MD as Consulting Physician (Gastroenterology)  Inpatient Treatment Team: Treatment Team: Attending Provider: Ardeth Sportsman, MD; Registered Nurse: Ignacia Marvel II, RN; Technician: Marilynne Halsted, [place]; Technician: Roe Coombs, NT; Technician: Francee Gentile Magbitang, NT; Respiratory Therapist: Ok Anis, RT  Subjective: Urinary retention - Foley replaced Feeling better  Using tylenol only Walking more in hallways  Objective:  Vital signs:  Temp:  [98.1 F (36.7 C)-98.5 F (36.9 C)] 98.1 F (36.7 C) (02/18 0618) Pulse Rate:  [73-75] 75  (02/18 0618) Resp:  [16-18] 17  (02/18 0618) BP: (130-171)/(73-83) 145/83 mmHg (02/18 0618) SpO2:  [92 %-97 %] 97 % (02/18 0618) Last BM Date: 01/06/12  Intake/Output   Yesterday:  02/17 0701 - 02/18 0700 In: 1920 [P.O.:1920] Out: 2600 [Urine:2600] This shift:     Bowel function:  Flatus: Y  BM: N  Physical Exam:  General: Pt awake/alert/oriented x4 in no acute distress Eyes: PERRL, normal EOM.  Sclera clear.  No icterus Neuro: CN II-XII intact w/o focal sensory/motor deficits. Lymph: No head/neck/groin lymphadenopathy Psych:  No delerium/psychosis/paranoia HENT: Normocephalic, Mucus membranes moist.  No thrush Neck: Supple, No tracheal deviation Chest: Clear.   No chest wall pain w good excursion CV:  Pulses intact.  Regular rhythm Abdomen: Soft, Obese.  Nontender/Nondistended.  No incarcerated hernias. Ext:  SCDs BLE.  No mjr edema.  No cyanosis Skin: No petechiae / purpurae  Results:   Labs: No results found for this or any previous visit (from the past 48 hour(s)).  Imaging / Studies: No results found.  Medications / Allergies: per chart  Antibiotics: Anti-infectives    None      Assessment  [name]  56  y.o. female  4 Days Post-Op  Procedure(s): TRANSANAL HEMORRHOIDAL DEARTERIALIZATION  Problem List:  Principal Problem:  *Hemorrhoids, internal, with bleeding & prolapse Active Problems:  OBESITY  HYPERTENSION  CONSTIPATION  Iron deficiency anemia, unspecified  Chronic pain  Acute urinary retention s/p Foley   Stabilizing   Plan: -chronic pain regimen -increase bowel regimen -Keep Foley x 3 d total (Day 1/3 = Wed 20Feb AM) -VTE prophylaxis- SCDs, etc -mobilize as tolerated to help recovery  Poss D/C LATER TODAY IF +BM Pt OK w leg bag & self- D/C Foley at home  Ardeth Sportsman, M.D., F.A.C.S. Gastrointestinal and Minimally Invasive Surgery [facility] [address], [address] [place], [place] [address] [phone] Main / Paging [phone] Voice Mail   01/10/2012

## 2012-01-10 NOTE — Telephone Encounter (Signed)
LMOM of appt time with Dr Michaell Cowing for her f/u visit.

## 2012-01-13 ENCOUNTER — Telehealth (INDEPENDENT_AMBULATORY_CARE_PROVIDER_SITE_OTHER): Payer: Self-pay

## 2012-01-13 NOTE — Telephone Encounter (Signed)
Pt calling into office today b/c had questions about a pressure sensation at the bottom after the [facility] where she feels like she has to have a BM all the time. I did advise pt that this one of the side effects after surgery but getting into a warm sitz bath would help with some of the pressure. The pt has been using a warm sitz bath and a warm wash cloth. The pt did remove the foley cath yesterday and she had some problems with urinary accidents with burning. The pt called her homehealth nurse to talk to her about the burning and she advised pt that this would get better in 24hrs. The pt said that her symtoms are getting better a little bit each day. I advised pt that if she continues to have any urinary problems during the night she needs to call me first thing in the am so I can refer her to [facility]. The pt has a f/u appt with Dr Michaell Cowing 02-02-12.

## 2012-01-14 ENCOUNTER — Other Ambulatory Visit (INDEPENDENT_AMBULATORY_CARE_PROVIDER_SITE_OTHER): Payer: Self-pay | Admitting: Surgery

## 2012-01-14 ENCOUNTER — Telehealth (INDEPENDENT_AMBULATORY_CARE_PROVIDER_SITE_OTHER): Payer: Self-pay

## 2012-01-14 DIAGNOSIS — R300 Dysuria: Secondary | ICD-10-CM

## 2012-01-14 LAB — URINALYSIS, ROUTINE W REFLEX MICROSCOPIC
Nitrite: NEGATIVE
Protein, ur: 100 mg/dL — AB
Specific Gravity, Urine: 1.02 (ref 1.005–1.030)
Urobilinogen, UA: 4 mg/dL — ABNORMAL HIGH (ref 0.0–1.0)

## 2012-01-14 LAB — URINALYSIS, MICROSCOPIC ONLY
Crystals: NONE SEEN
Squamous Epithelial / LPF: NONE SEEN

## 2012-01-14 MED ORDER — CIPROFLOXACIN HCL 500 MG PO TABS: 500.0000 mg | ORAL_TABLET | Freq: Two times a day (BID) | ORAL | Status: DC

## 2012-01-14 NOTE — Telephone Encounter (Signed)
Ms. Panek called this morning c/o severe nausea.  She says she is not vomiting, just very nauseated.  I advised her to try and eat some dry crackers and sip water to see if that helps, but it was important that she try to get a ride to the lab for her UA test.  She says she has no fever, just nausea.  She will try to get a ride to the lab today.  I urged her to call us if she starts vomiting or spiked a fever.  She agreed.

## 2012-01-14 NOTE — Telephone Encounter (Signed)
Called pt b/c we received her urine results from today. I showed the urine results to DR Abbey Chatters which he stated the pt has a UTI and needs to take Cipro 500mg  BID x10days. I e-prescribed the script to [facility] per pt's info. I advised pt to drink plenty of fluids and drink cranberry juice. I will call her on Monday with culture results. I advised pt if she gets worse over the weekend to call our office to speak to a physician on call. The pt understand.

## 2012-01-14 NOTE — Telephone Encounter (Signed)
Pt calling back this am about burning with urination. The pt is urinating on her own and seems to be getting better but she feels throbbing right before she finishes urinating. The pt said she is having throbbing from the rectum so I was asking questions to make sure the throbbing that she was feeling was not coming from the bottom. The pt said she thinks it from the urinating part. B/c she only feels this when she is almost done urinating. I spoke to Dr Jamey Ripa about the pt since Dr Michaell Cowing is out of the office and he advised pt to go get a urine specimen with culture. Dr Jamey Ripa advised pt to drink plenty of fluids also. The pt will go to [place] today. I will call pt back next week to check on her and give results.

## 2012-01-18 ENCOUNTER — Telehealth (INDEPENDENT_AMBULATORY_CARE_PROVIDER_SITE_OTHER): Payer: Self-pay

## 2012-01-18 NOTE — Telephone Encounter (Signed)
Called pt to check on her after the weekend b/c the pt has been having a tough time getting over surgery. The pt states she is feeling a little better not a lot but she is able to sit on the toilet now when she has a BM b/c she has been having to stand. The pt just feels very tired and run down after surgery b/c several days she was not eating or drinking b/c going to the bathroom so much. I advised pt that she couldn't do that she needed to eat and drink that would help with recovery. The pt has been taking an antibiotic for her UTI and that seems to be better. The pt just feels pressure at her bottom to where she feels she needs to go have a BM. The pt states she has gone anywhere from 15 to 20 times in one day. The pt is not constipated b/c she is having soft BM's not diarrhea. I advised pt that Dr Michaell Cowing should be in the office on Wednesday and I will go over with him the pt's results. I will call the pt back after I speak with DR Gross.

## 2012-01-19 ENCOUNTER — Emergency Department (HOSPITAL_COMMUNITY)
Admission: EM | Admit: 2012-01-19 | Discharge: 2012-01-20 | Disposition: A | Discharge status: Home / Self Care | Payer: Medicare Other | Attending: Emergency Medicine | Admitting: Emergency Medicine

## 2012-01-19 ENCOUNTER — Ambulatory Visit: Payer: Medicare Other | Admitting: Gastroenterology

## 2012-01-19 ENCOUNTER — Encounter (HOSPITAL_COMMUNITY): Payer: Self-pay | Admitting: Adult Health

## 2012-01-19 ENCOUNTER — Telehealth (INDEPENDENT_AMBULATORY_CARE_PROVIDER_SITE_OTHER): Payer: Self-pay

## 2012-01-19 DIAGNOSIS — R5381 Other malaise: Secondary | ICD-10-CM | POA: Insufficient documentation

## 2012-01-19 DIAGNOSIS — Z9889 Other specified postprocedural states: Secondary | ICD-10-CM | POA: Insufficient documentation

## 2012-01-19 DIAGNOSIS — M810 Age-related osteoporosis without current pathological fracture: Secondary | ICD-10-CM | POA: Insufficient documentation

## 2012-01-19 DIAGNOSIS — K625 Hemorrhage of anus and rectum: Secondary | ICD-10-CM | POA: Insufficient documentation

## 2012-01-19 DIAGNOSIS — K6289 Other specified diseases of anus and rectum: Secondary | ICD-10-CM | POA: Insufficient documentation

## 2012-01-19 DIAGNOSIS — K644 Residual hemorrhoidal skin tags: Secondary | ICD-10-CM | POA: Insufficient documentation

## 2012-01-19 DIAGNOSIS — I10 Essential (primary) hypertension: Secondary | ICD-10-CM | POA: Insufficient documentation

## 2012-01-19 DIAGNOSIS — I252 Old myocardial infarction: Secondary | ICD-10-CM | POA: Insufficient documentation

## 2012-01-19 DIAGNOSIS — K219 Gastro-esophageal reflux disease without esophagitis: Secondary | ICD-10-CM | POA: Insufficient documentation

## 2012-01-19 DIAGNOSIS — R011 Cardiac murmur, unspecified: Secondary | ICD-10-CM | POA: Insufficient documentation

## 2012-01-19 DIAGNOSIS — J45909 Unspecified asthma, uncomplicated: Secondary | ICD-10-CM | POA: Insufficient documentation

## 2012-01-19 DIAGNOSIS — G4730 Sleep apnea, unspecified: Secondary | ICD-10-CM | POA: Insufficient documentation

## 2012-01-19 LAB — CBC
HCT: 39 % (ref 36.0–46.0)
Hemoglobin: 13.4 g/dL (ref 12.0–15.0)
MCH: 27.2 pg (ref 26.0–34.0)
MCHC: 34.4 g/dL (ref 30.0–36.0)
MCV: 79.3 fL (ref 78.0–100.0)
Platelets: 362 10*3/uL (ref 150–400)
RBC: 4.92 MIL/uL (ref 3.87–5.11)
RDW: 14.6 % (ref 11.5–15.5)
WBC: 7.5 10*3/uL (ref 4.0–10.5)

## 2012-01-19 LAB — BASIC METABOLIC PANEL
BUN: 7 mg/dL (ref 6–23)
CO2: 29 mEq/L (ref 19–32)
Calcium: 9.7 mg/dL (ref 8.4–10.5)
Chloride: 99 mEq/L (ref 96–112)
Creatinine, Ser: 0.81 mg/dL (ref 0.50–1.10)
GFR calc Af Amer: >90 mL/min (ref >90)
GFR calc non Af Amer: 79 mL/min — ABNORMAL LOW (ref >90)
Glucose, Bld: 118 mg/dL — ABNORMAL HIGH (ref 70–99)
Potassium: 3.5 mEq/L (ref 3.5–5.1)
Sodium: 137 mEq/L (ref 135–145)

## 2012-01-19 MED ORDER — WHITE PETROLATUM GEL
Status: AC
  Administered 2012-01-19: 23:00:00
  Filled 2012-01-19: qty 5

## 2012-01-19 MED ORDER — OXYCODONE-ACETAMINOPHEN 5-325 MG PO TABS
1.0000 | ORAL_TABLET | Freq: Once | ORAL | Status: AC
  Administered 2012-01-19: 1 via ORAL
  Filled 2012-01-19: qty 1

## 2012-01-19 NOTE — ED Provider Notes (Signed)
History     CSN: [id number]  Arrival date & time 01/19/12  1949   First MD Initiated Contact with Patient 01/19/12 2013      Chief Complaint  Patient presents with  . Rectal Bleeding  HPI: Patient is a 56 y.o. female presenting with hematochezia. The history is provided by the patient.  Rectal Bleeding  The current episode started today. The problem occurs frequently. The problem has been gradually worsening. The pain is severe. The stool is described as soft. Associated symptoms include hemorrhoids, nausea and rectal pain. Pertinent negatives include no fever, no abdominal pain, no diarrhea, no vomiting and no hematuria.  Pt states is s/p hemorrhoidectomy on 01/06/2012. D/C'd home on 01/12/2012. Has been on a fiber stool softner and has continued to have mx stools a day. She describes them as very soft but not diarrhea. Today at approx 1500 pt states she went to have BM and noted lots of bright red rectal bleeding that has persisted. Pt reports she has continued to have a significant amount of rectal pain that has not been adequately managed with the Tramadol her surgeon gave gave her. Pt report s mild weakness this evening but denies SOB of CP.   Past Medical History  Diagnosis Date  . Seasonal allergies   . Asthma     History of  . Hypertension   . Iron deficiency 12-07-2011  . Myocardial infarction     unsure when  . Anemia   . GERD (gastroesophageal reflux disease)   . Heart murmur   . Osteoporosis   . PONV (postoperative nausea and vomiting)   . Dysrhythmia     occ palpitations   . Sleep apnea     no CPAP machine , borderine   . Headache     occasional   . Osteoarthritis     osteoarthritis     Past Surgical History  Procedure Date  . Knee surgery     Both knees  . Total knee arthroplasty 04/29/2011    Left knee  . Multiple tooth extractions   . Wisdom tooth extraction   . Colonoscopy 2009  . Knee arthroscopy 1991    Family History  Problem Relation Age of Onset    . Colon cancer Neg Hx   . Diabetes Mother   . Hypertension Mother   . Heart disease Sister     Congestive heart failure  . Diabetes type II Sister   . Kidney disease Brother     History  Substance Use Topics  . Smoking status: Never Smoker   . Smokeless tobacco: Never Used  . Alcohol Use: No    OB History    Grav Para Term Preterm Abortions TAB SAB Ect Mult Living                  Review of Systems  Constitutional: Negative.  Negative for fever.  HENT: Negative.   Eyes: Negative.   Respiratory: Negative.   Cardiovascular: Negative.   Gastrointestinal: Positive for nausea, hematochezia, rectal pain and hemorrhoids. Negative for vomiting, abdominal pain and diarrhea.  Genitourinary: Negative.  Negative for hematuria.  Musculoskeletal: Negative.   Skin: Negative.   Neurological: Negative.   Hematological: Negative.   Psychiatric/Behavioral: Negative.     Allergies  Codeine; Hydrocodone; Latex; Oxycodone-acetaminophen; Peanut-containing drug products; and Penicillins  Home Medications   Current Outpatient Rx  Name Route Sig Dispense Refill  . ACETAMINOPHEN 500 MG PO TABS Oral Take 1,000 mg by mouth every 6 (six)  hours as needed. Pain     . ALBUTEROL SULFATE HFA 108 (90 BASE) MCG/ACT IN AERS Inhalation Inhale 2 puffs into the lungs every 6 (six) hours as needed. Wheezing     . CELECOXIB 100 MG PO CAPS Oral Take 200 mg by mouth 2 (two) times daily. Pt only takes prn    . CIPROFLOXACIN HCL 500 MG PO TABS Oral Take 1 tablet (500 mg total) by mouth 2 (two) times daily. 20 tablet 0  . CYCLOSPORINE 0.05 % OP EMUL Both Eyes Place 1 drop into both eyes 2 (two) times daily.    Marland Kitchen DICLOFENAC SODIUM 1 % TD GEL Topical Apply 1 application topically 2 (two) times daily as needed. Pain     . ESOMEPRAZOLE MAGNESIUM 40 MG PO CPDR Oral Take 40 mg by mouth daily before breakfast.    . FLUOCINOLONE ACETONIDE 0.01 % EX OIL Topical Apply 1-2 drops topically 2 (two) times daily as needed.  Itching     . FLUTICASONE FUROATE 27.5 MCG/SPRAY NA SUSP Nasal Place 2 sprays into the nose daily.    Marland Kitchen HYDROCHLOROTHIAZIDE 12.5 MG PO CAPS Oral Take 12.5 mg by mouth 3 (three) times a week.     . ADULT MULTIVITAMIN W/MINERALS CH Oral Take 0.5 tablets by mouth every other day.    . NEBIVOLOL HCL 5 MG PO TABS Oral Take 5 mg by mouth at bedtime.     Marland Kitchen PREGABALIN 50 MG PO CAPS Oral Take 50 mg by mouth daily.     Frazier Butt BALANCE OP Ophthalmic Apply to eye daily.    Marland Kitchen VALSARTAN 160 MG PO TABS Oral Take 160 mg by mouth 2 (two) times daily.     Marland Kitchen VITAMIN B-12 500 MCG PO TABS Oral Take 500 mcg by mouth daily.      BP 161/112  Pulse 99  Temp(Src) 98.8 F (37.1 C) (Oral)  Resp 20  SpO2 98%  Physical Exam  Constitutional: She is oriented to person, place, and time. She appears well-developed and well-nourished.  HENT:  Head: Normocephalic and atraumatic.  Eyes: Conjunctivae are normal.  Neck: Neck supple.  Cardiovascular: Normal rate and regular rhythm.   Pulmonary/Chest: Effort normal and breath sounds normal.  Abdominal: Soft. Bowel sounds are normal.  Genitourinary: Rectal exam shows external hemorrhoid.       No active bleeding upon my exam. Noted small to mod amount bright red blood in bed pain w/ small clots. 1 approx 1 cm external hemorrhoids w/ a smaller approx .5 cm external hemorrhoid, neither thrombosed (flesh colored)   Musculoskeletal: Normal range of motion.  Neurological: She is alert and oriented to person, place, and time.  Skin: Skin is warm and dry. No erythema.  Psychiatric: She has a normal mood and affect.    ED Course  Procedures  Patient reports relief with pain after Percocet by mouth. Findings and clinical impression discussed with patient. Informed patient that I have spoken with Dr. Carolynne Edouard who has requested that patient call first thing in the morning to arrange follow up for sometime tomorrow for recheck in the office. Patient agreeable with plan.  Labs  Reviewed - No data to display No results found.   No diagnosis found.    MDM  HPI/PE and clinical findings c/w 1. Rectal bleeding s/p hemorrhoidectomy (01/06/2012)  2. Rectal pain not managed w/ Tramadol        Leanne Chang, NP 01/20/12 1610  Roma Kayser Tamey Wanek, NP 01/20/12 9604

## 2012-01-19 NOTE — ED Notes (Signed)
AVW:[id number]<XB> Expected date:<BR> Expected time:<BR> Means of arrival:<BR> Comments:<BR> EMS/rectal bleed-S/P hemorrhoid surgery on Feb 14th

## 2012-01-19 NOTE — ED Notes (Signed)
hemorrhodectomy on Feb 14 at Dr. Acquanetta Belling office. Today pt began having bright red blood from rectum, dripping while walking and with each BM, associated with large clots. Pt is presently bleeding at this time

## 2012-01-19 NOTE — Telephone Encounter (Signed)
Called pt to check on her again and she is still feeling much better. The pt finally got her BM's under control herself. I did speak with Dr Michaell Cowing about her condition since surgery and he advised pt to take OTC Pepto or Caopectate. The pt could also try Immodium at night if the BM's were still really frequent. The pt has a f/u appt with DR Gross on 02-02-12. I am still waiting on the urine culture results and will call her when they are available.

## 2012-01-20 ENCOUNTER — Telehealth (INDEPENDENT_AMBULATORY_CARE_PROVIDER_SITE_OTHER): Payer: Self-pay

## 2012-01-20 MED ORDER — OXYCODONE-ACETAMINOPHEN 5-325 MG PO TABS: 1.0000 | ORAL_TABLET | ORAL | Status: AC | PRN

## 2012-01-20 MED ORDER — DIBUCAINE 1 % EX OINT: TOPICAL_OINTMENT | Freq: Three times a day (TID) | CUTANEOUS | Status: AC | PRN

## 2012-01-20 NOTE — Telephone Encounter (Signed)
Pt calling in b/c she had an eppisode last night of rectal bleeding that scared her really bad. The pt called our oficce to speak to doctor on call but no one returned her call so she called an ambulance. The pt was taken to the ER where they got the rectal bleeding under control by pt sitting on something hard. The pt was told to f/u with [place] today. The pt is not bleeding today. I advised pt that I was going to contact Dr Michaell Cowing and call her back.   I did speak with Dr Michaell Cowing and he advised pt to use ice packs along with warm sitz baths. The pt needs to be on a clear diet for a few days to give her colon time to settle down from the episode. The pt does not take any aspirin,nsaids,or Celebrex. The pt has not had any bleeding today so she doesn't want to come to the ofice today b/c she is too weak. The pt was advised that we would see her today if she wanted to but it's ok if she wants to stay home. The pt advised if anymore episodes to call our office.

## 2012-01-20 NOTE — Discharge Instructions (Signed)
Please read over the instructions below. Take the medication as directed for pain and use the ointment up to 3 times a day as needed for rectal pain. Call first thing in the morning to arrange follow up at the surgeon's office for recheck tomorrow. Return if symptoms worsen.  .Hemorrhoids Hemorrhoids are veins in the rectum that get big. These veins can get blocked. Blocked veins become puffy (swollen) and painful. HOME CARE  Eat more fiber.   Drink enough fluid to keep your pee (urine) clear or pale yellow.   Exercise often.   Avoid straining to poop (bowel movement).   Keep the butt area dry and clean.   Only take medicine as told by your doctor.  If your hemorrhoids are puffy and painful:  Take a warm bath for 20 to 30 minutes. Do this 3 to 4 times a day.   Place ice packs on the area. Use the ice packs between the baths.   Put ice in a plastic bag.   Place a towel between your skin and the bag.   Leave the ice on for 15 to 20 minutes, 3 to 4 times a day.   Do not use a donut-shaped pillow. Do not sit on the toilet for a long time.   Go to the bathroom when your body has the urge to poop. This is so you do not strain as much to poop.  GET HELP RIGHT AWAY IF:   You have increasing pain that is not controlled with medicine.   You have uncontrolled bleeding.   You cannot poop.   You have pain or puffiness outside the area of the hemorrhoids.   You have chills.   You have a temperature by mouth above 102 F (38.9 C), not controlled by medicine.  MAKE SURE YOU:   Understand these instructions.   Will watch your condition.   Will get help right away if you are not doing well or get worse.  Document Released: 08/17/2008 Document Revised: 07/21/2011 Document Reviewed: 08/17/2008 [facility] Patient Information 2012 [place], [place].Rectal Bleeding Rectal bleeding is when blood passes out of the anus. It is usually a sign that something is wrong. It may not be serious,  but it should always be evaluated. Rectal bleeding may present as bright red blood or extremely dark stools. The color may range from dark red or maroon to black (like tar). It is important that the cause of rectal bleeding be identified so treatment can be started and the problem corrected. CAUSES   Hemorrhoids. These are enlarged (dilated) blood vessels or veins in the anal or rectal area.   Fistulas. Theseare abnormal, burrowing channels that usually run from inside the rectum to the skin around the anus. They can bleed.   Anal fissures. This is a tear in the tissue of the anus. Bleeding occurs with bowel movements.   Diverticulosis. This is a condition in which pockets or sacs project from the bowel wall. Occasionally, the sacs can bleed.   Diverticulitis. Thisis an infection involving diverticulosis of the colon.   Proctitis and colitis. These are conditions in which the rectum, colon, or both, can become inflamed and pitted (ulcerated).   Polyps and cancer. Polyps are non-cancerous (benign) growths in the colon that may bleed. Certain types of polyps turn into cancer.   Protrusion of the rectum. Part of the rectum can project from the anus and bleed.   Certain medicines.   Intestinal infections.   Blood vessel abnormalities.  HOME  CARE INSTRUCTIONS  Eat a high-fiber diet to keep your stool soft.   Limit activity.   Drink enough fluids to keep your urine clear or pale yellow.   Warm baths may be useful to soothe rectal pain.   Follow up with your caregiver as directed.  SEEK IMMEDIATE MEDICAL CARE IF:  You develop increased bleeding.   You have black or dark red stools.   You vomit blood or material that looks like coffee grounds.   You have abdominal pain or tenderness.   You have a fever.   You feel weak, nauseous, or you faint.   You have severe rectal pain or you are unable to have a bowel movement.  MAKE SURE YOU:  Understand these instructions.    Will watch your condition.   Will get help right away if you are not doing well or get worse.  Document Released: 04/30/2002 Document Revised: 07/21/2011 Document Reviewed: 04/25/2011 [facility] Patient Information 2012 [place], [place].

## 2012-01-21 ENCOUNTER — Ambulatory Visit (HOSPITAL_COMMUNITY): Payer: Medicare Other

## 2012-01-21 NOTE — ED Provider Notes (Signed)
Medical screening examination/treatment/procedure(s) were performed by non-physician practitioner and as supervising physician I was immediately available for consultation/collaboration.  Staffed and discussed with NP Schorr.  Spoken with general surgeon on call to verify close outpt follow up.  Hgb and hemodynamics stable.    Peyson Delao Y.   Gavin Pound. Oletta Lamas, MD 01/21/12 1610

## 2012-01-26 LAB — URINE CULTURE

## 2012-01-31 ENCOUNTER — Telehealth (INDEPENDENT_AMBULATORY_CARE_PROVIDER_SITE_OTHER): Payer: Self-pay

## 2012-01-31 NOTE — Telephone Encounter (Signed)
Pt called this am c/o having some trouble with constipation. The pt was advised to use Metamucil along with stool softners. The pt was advised to drink plenty of fluids. The pt has a f/u appt with Dr Michaell Cowing this week.

## 2012-02-02 ENCOUNTER — Ambulatory Visit (INDEPENDENT_AMBULATORY_CARE_PROVIDER_SITE_OTHER): Payer: Medicare Other | Admitting: Surgery

## 2012-02-02 ENCOUNTER — Encounter (INDEPENDENT_AMBULATORY_CARE_PROVIDER_SITE_OTHER): Payer: Self-pay | Admitting: Surgery

## 2012-02-02 ENCOUNTER — Encounter (INDEPENDENT_AMBULATORY_CARE_PROVIDER_SITE_OTHER): Payer: Medicare Other | Admitting: Surgery

## 2012-02-02 VITALS — BP 142/84 | HR 72 | Temp 97.6°F | Resp 18 | Ht 68.0 in | Wt 311.6 lb

## 2012-02-02 DIAGNOSIS — K648 Other hemorrhoids: Secondary | ICD-10-CM

## 2012-02-02 DIAGNOSIS — K644 Residual hemorrhoidal skin tags: Secondary | ICD-10-CM

## 2012-02-02 DIAGNOSIS — K5900 Constipation, unspecified: Secondary | ICD-10-CM

## 2012-02-02 NOTE — Progress Notes (Signed)
Subjective:     Patient ID: [name], female   DOB: 04-Jul-1956, 56 y.o.   MRN: [id number]  HPI  [name]  January 27, 1956 [id number]  Patient Care Team: Norberto Sorenson, MD as PCP - General (Family Medicine) Louis Meckel, MD as Consulting Physician (Gastroenterology)  This patient is a 56 y.o.female who presents today for surgical evaluation.   Procedure: THD hemorrhoidal ligation and pexy. External hemorrhoidectomy x2.  01/06/2012  Patient comes in today starting to feel better. She struggled with a lot of soreness. She had urinary retention. She had a Proteus UTI. She got antibiotics. She had a Foley catheter for a few days. She is urinating fine. No hematuria.  She had some rectal bleeding. Concerned her. That has tapered off. She's weaned herself off narcotics. She's had a few instances of diffuse body itching. No really perianal. It has persisted off narcotics. No other changes in her medications. No change in soaks or detergents or showering.   She's up the fiber in her diet and added a stool softener. She is now having one to 2 small but soft bowel movements a day. Not hurting to defecate as much. Sitz baths and warm soaks are helpful. Now able to sit while she defecates.  She does feel a lump on the left side of her anus which has gotten smaller. She claims she's had a rough time but she finally feels like she is getting better.  Patient Active Problem List  Diagnoses  . HYPERLIPIDEMIA  . OBESITY  . SLEEP APNEA, OBSTRUCTIVE, MILD  . TINNITUS, LEFT  . HYPERTENSION  . SINUSITIS  . ALLERGIC RHINITIS  . ASTHMA  . DENTAL PAIN  . GERD  . CONSTIPATION  . HEMATURIA UNSPECIFIED  . POSTMENOPAUSAL SYNDROME  . Unspecified Pruritic Disorder  . Pain in joint, lower leg  . BUNIONS, BILATERAL  . Iron deficiency anemia, unspecified  . Hemorrhoids, internal, with bleeding & prolapse  . Chronic pain  . Acute urinary retention s/p Foley  . EXTERNAL HEMORRHOIDS    Past Medical  History  Diagnosis Date  . Seasonal allergies   . Asthma     History of  . Hypertension   . Iron deficiency 12-07-2011  . Myocardial infarction     unsure when  . Anemia   . GERD (gastroesophageal reflux disease)   . Heart murmur   . Osteoporosis   . PONV (postoperative nausea and vomiting)   . Dysrhythmia     occ palpitations   . Sleep apnea     no CPAP machine , borderine   . Headache     occasional   . Osteoarthritis     osteoarthritis     Past Surgical History  Procedure Date  . Knee surgery     Both knees  . Total knee arthroplasty 04/29/2011    Left knee  . Multiple tooth extractions   . Wisdom tooth extraction   . Colonoscopy 2009  . Knee arthroscopy 1991  . Transanal hemorrhoidal dearterliaization 01/06/12    with external hemorrhoid removal    History   Social History  . Marital Status: Single    Spouse Name: N/A    Number of Children: 2  . Years of Education: N/A   Occupational History  . Retired    Social History Main Topics  . Smoking status: Never Smoker   . Smokeless tobacco: Never Used  . Alcohol Use: No  . Drug Use: No  . Sexually Active: Not on  file   Other Topics Concern  . Not on file   Social History Narrative  . No narrative on file    Family History  Problem Relation Age of Onset  . Colon cancer Neg Hx   . Diabetes Mother   . Hypertension Mother   . Heart disease Sister     Congestive heart failure  . Diabetes type II Sister   . Kidney disease Brother     Current outpatient prescriptions:acetaminophen (TYLENOL) 500 MG tablet, Take 1,000 mg by mouth every 6 (six) hours as needed. Pain , Disp: , Rfl: ;  albuterol (PROVENTIL HFA;VENTOLIN HFA) 108 (90 BASE) MCG/ACT inhaler, Inhale 2 puffs into the lungs every 6 (six) hours as needed. Wheezing , Disp: , Rfl: ;  celecoxib (CELEBREX) 100 MG capsule, Take 200 mg by mouth daily as needed. JOINT PAIN, Disp: , Rfl:  cycloSPORINE (RESTASIS) 0.05 % ophthalmic emulsion, Place 1 drop into  both eyes 2 (two) times daily., Disp: , Rfl: ;  diclofenac sodium (VOLTAREN) 1 % GEL, Apply 1 application topically 2 (two) times daily as needed. TOPICAL TO KNEE, SHOULDER, WRIST, AND ANKLE PAIN, Disp: , Rfl: ;  esomeprazole (NEXIUM) 40 MG capsule, Take 40 mg by mouth daily before breakfast., Disp: , Rfl:  fluticasone (VERAMYST) 27.5 MCG/SPRAY nasal spray, Place 2 sprays into the nose daily., Disp: , Rfl: ;  hydrochlorothiazide (MICROZIDE) 12.5 MG capsule, Take 12.5 mg by mouth daily. , Disp: , Rfl: ;  Multiple Vitamin (MULITIVITAMIN WITH MINERALS) TABS, Take 0.5 tablets by mouth every other day., Disp: , Rfl: ;  nebivolol (BYSTOLIC) 5 MG tablet, Take 5 mg by mouth at bedtime. , Disp: , Rfl:  pregabalin (LYRICA) 50 MG capsule, Take 50 mg by mouth daily. , Disp: , Rfl: ;  Propylene Glycol (SYSTANE BALANCE OP), Place 2 drops into both eyes daily. , Disp: , Rfl: ;  valsartan (DIOVAN) 160 MG tablet, Take 160 mg by mouth 2 (two) times daily. , Disp: , Rfl: ;  vitamin B-12 (CYANOCOBALAMIN) 500 MCG tablet, Take 500 mcg by mouth daily., Disp: , Rfl:   Allergies  Allergen Reactions  . Codeine Other (See Comments)    hallucinations  . Hydrocodone Other (See Comments)    Constipation   . Latex Itching  . Oxycodone-Acetaminophen Other (See Comments)    Constipation   . Peanut-Containing Drug Products Swelling  . Penicillins Swelling    BP 142/84  Pulse 72  Temp(Src) 97.6 F (36.4 C) (Temporal)  Resp 18  Ht 5\' 8"  (1.727 m)  Wt 311 lb 9.6 oz (141.341 kg)  BMI 47.38 kg/m2     Review of Systems  Constitutional: Negative for fever, chills and diaphoresis.  HENT: Negative for ear pain, sore throat and trouble swallowing.   Eyes: Negative for photophobia and visual disturbance.  Respiratory: Negative for cough and choking.   Cardiovascular: Negative for chest pain and palpitations.  Gastrointestinal: Positive for rectal pain. Negative for nausea, vomiting, abdominal pain, diarrhea, constipation,  blood in stool and anal bleeding.  Genitourinary: Negative for dysuria, frequency, decreased urine volume, vaginal bleeding, difficulty urinating and pelvic pain.  Musculoskeletal: Negative for myalgias and gait problem.  Skin: Negative for color change, pallor and rash.  Neurological: Negative for dizziness, speech difficulty, weakness and numbness.  Hematological: Negative for adenopathy.  Psychiatric/Behavioral: Negative for confusion and agitation. The patient is not nervous/anxious.        Objective:   Physical Exam  Constitutional: She is oriented to person, place, and  time. She appears well-developed and well-nourished. No distress.  HENT:  Head: Normocephalic.  Mouth/Throat: Oropharynx is clear and moist. No oropharyngeal exudate.  Eyes: Conjunctivae and EOM are normal. Pupils are equal, round, and reactive to light. No scleral icterus.  Neck: Normal range of motion. No tracheal deviation present.  Cardiovascular: Normal rate and intact distal pulses.   Pulmonary/Chest: Effort normal. No respiratory distress. She exhibits no tenderness.  Abdominal: Soft. She exhibits no distension. There is no tenderness. Hernia confirmed negative in the right inguinal area and confirmed negative in the left inguinal area.       Morbidly obese.  No hernias  Genitourinary: No vaginal discharge found.       Perianal skin clean with good hygiene.  No pruritis.  No pilonidal disease.  No fissure.  No abscess/fistula.    Normal sphincter tone.  Left ant skin tag 1x2cm   Musculoskeletal: Normal range of motion. She exhibits no tenderness.  Lymphadenopathy:       Right: No inguinal adenopathy present.       Left: No inguinal adenopathy present.  Neurological: She is alert and oriented to person, place, and time. No cranial nerve deficit. She exhibits normal muscle tone. Coordination normal.  Skin: Skin is warm and dry. No rash noted. She is not diaphoretic.  Psychiatric: She has a normal mood and  affect. Her behavior is normal.       Assessment:     One month status post [facility] hemorrhoidal ligation/pexy and external hemorrhoidectomy for extensive internal and external hemorrhoidal disease. Finally starting to recover.    Plan:     I. lead she is getting better.  Continue bowel regimen. This is the most important thing to help her continue to heal and prevent new hemorrhoids  Probable small left anterior skin tag. Should resolve over time. Even if she has a small and there I would hesitate to remove it. She does not want to either.  Consider Benadryl for itching at night & to help her sleep. Hopefully that as a temporary annoyance. I cannot see a medication allergy since she is off narcotics and no other etiology. Seems to be getting better on its own.  Return to clinic in 3 weeks to make sure she is continuing to improve. She expressed understanding. At the end of the meeting, she was appreciated of our efforts.

## 2012-02-02 NOTE — Patient Instructions (Signed)

## 2012-02-28 ENCOUNTER — Ambulatory Visit (INDEPENDENT_AMBULATORY_CARE_PROVIDER_SITE_OTHER): Payer: Medicare Other | Admitting: Surgery

## 2012-02-28 ENCOUNTER — Encounter (INDEPENDENT_AMBULATORY_CARE_PROVIDER_SITE_OTHER): Payer: Self-pay | Admitting: Surgery

## 2012-02-28 VITALS — BP 158/96 | HR 76 | Temp 97.6°F | Resp 20 | Ht 68.0 in | Wt 313.4 lb

## 2012-02-28 DIAGNOSIS — K648 Other hemorrhoids: Secondary | ICD-10-CM

## 2012-02-28 DIAGNOSIS — K644 Residual hemorrhoidal skin tags: Secondary | ICD-10-CM

## 2012-02-28 MED ORDER — HYDROCORTISONE ACE-PRAMOXINE 2.5-1 % RE CREA: TOPICAL_CREAM | Freq: Four times a day (QID) | RECTAL | Status: AC | PRN

## 2012-02-28 NOTE — Patient Instructions (Signed)

## 2012-02-28 NOTE — Progress Notes (Signed)
Subjective:     Patient ID: [name], female   DOB: 04-25-1956, 56 y.o.   MRN: [id number]  HPI   [name]  11-27-1955 [id number]  Patient Care Team: Norberto Sorenson, MD as PCP - General (Family Medicine) Louis Meckel, MD as Consulting Physician (Gastroenterology)  This patient is a 56 y.o.female who presents today for surgical evaluation.   Procedure: THD hemorrhoidal ligation and pexy. External hemorrhoidectomy x2.  01/06/2012  Patient comes in today feeling better. She is urinating fine. No hematuria. No more diffuse itching.   She had some rectal bleeding is nearly gone. She's still gets some perianal itching.  She's up the fiber in her diet and added a stool softener. She is now having one to 2 small but soft bowel movements a day. Not hurting to defecate.  Patient Active Problem List  Diagnoses  . HYPERLIPIDEMIA  . OBESITY  . SLEEP APNEA, OBSTRUCTIVE, MILD  . TINNITUS, LEFT  . HYPERTENSION  . SINUSITIS  . ALLERGIC RHINITIS  . ASTHMA  . DENTAL PAIN  . GERD  . CONSTIPATION  . HEMATURIA UNSPECIFIED  . POSTMENOPAUSAL SYNDROME  . Unspecified Pruritic Disorder  . Pain in joint, lower leg  . BUNIONS, BILATERAL  . Iron deficiency anemia, unspecified  . Hemorrhoids, internal, with bleeding & prolapse  . Chronic pain  . Acute urinary retention s/p Foley  . EXTERNAL HEMORRHOIDS    Past Medical History  Diagnosis Date  . Seasonal allergies   . Asthma     History of  . Hypertension   . Iron deficiency 12-07-2011  . Myocardial infarction     unsure when  . Anemia   . GERD (gastroesophageal reflux disease)   . Heart murmur   . Osteoporosis   . PONV (postoperative nausea and vomiting)   . Dysrhythmia     occ palpitations   . Sleep apnea     no CPAP machine , borderine   . Headache     occasional   . Osteoarthritis     osteoarthritis     Past Surgical History  Procedure Date  . Knee surgery     Both knees  . Total knee arthroplasty 04/29/2011    Left  knee  . Multiple tooth extractions   . Wisdom tooth extraction   . Colonoscopy 2009  . Knee arthroscopy 1991  . Transanal hemorrhoidal dearterliaization 01/06/12    with external hemorrhoid removal    History   Social History  . Marital Status: Single    Spouse Name: N/A    Number of Children: 2  . Years of Education: N/A   Occupational History  . Retired    Social History Main Topics  . Smoking status: Never Smoker   . Smokeless tobacco: Never Used  . Alcohol Use: No  . Drug Use: No  . Sexually Active: Not on file   Other Topics Concern  . Not on file   Social History Narrative  . No narrative on file    Family History  Problem Relation Age of Onset  . Colon cancer Neg Hx   . Diabetes Mother   . Hypertension Mother   . Heart disease Sister     Congestive heart failure  . Diabetes type II Sister   . Kidney disease Brother     Current outpatient prescriptions:acetaminophen (TYLENOL) 500 MG tablet, Take 1,000 mg by mouth every 6 (six) hours as needed. Pain , Disp: , Rfl: ;  albuterol (PROVENTIL HFA;VENTOLIN HFA)  108 (90 BASE) MCG/ACT inhaler, Inhale 2 puffs into the lungs every 6 (six) hours as needed. Wheezing , Disp: , Rfl: ;  celecoxib (CELEBREX) 100 MG capsule, Take 200 mg by mouth daily as needed. JOINT PAIN, Disp: , Rfl:  cycloSPORINE (RESTASIS) 0.05 % ophthalmic emulsion, Place 1 drop into both eyes 2 (two) times daily., Disp: , Rfl: ;  diclofenac sodium (VOLTAREN) 1 % GEL, Apply 1 application topically 2 (two) times daily as needed. TOPICAL TO KNEE, SHOULDER, WRIST, AND ANKLE PAIN, Disp: , Rfl: ;  esomeprazole (NEXIUM) 40 MG capsule, Take 40 mg by mouth daily before breakfast., Disp: , Rfl:  fluticasone (VERAMYST) 27.5 MCG/SPRAY nasal spray, Place 2 sprays into the nose daily., Disp: , Rfl: ;  hydrochlorothiazide (MICROZIDE) 12.5 MG capsule, Take 12.5 mg by mouth daily. , Disp: , Rfl: ;  Multiple Vitamin (MULITIVITAMIN WITH MINERALS) TABS, Take 0.5 tablets by mouth  every other day., Disp: , Rfl: ;  nebivolol (BYSTOLIC) 5 MG tablet, Take 5 mg by mouth at bedtime. , Disp: , Rfl:  pregabalin (LYRICA) 50 MG capsule, Take 50 mg by mouth daily. , Disp: , Rfl: ;  Propylene Glycol (SYSTANE BALANCE OP), Place 2 drops into both eyes daily. , Disp: , Rfl: ;  valsartan (DIOVAN) 160 MG tablet, Take 160 mg by mouth 2 (two) times daily. , Disp: , Rfl: ;  vitamin B-12 (CYANOCOBALAMIN) 500 MCG tablet, Take 500 mcg by mouth daily., Disp: , Rfl:   Allergies  Allergen Reactions  . Codeine Other (See Comments)    hallucinations  . Hydrocodone Other (See Comments)    Constipation   . Latex Itching  . Oxycodone-Acetaminophen Other (See Comments)    Constipation   . Peanut-Containing Drug Products Swelling  . Penicillins Swelling    BP 158/96  Pulse 76  Temp(Src) 97.6 F (36.4 C) (Temporal)  Resp 20  Ht 5\' 8"  (1.727 m)  Wt 313 lb 6.4 oz (142.157 kg)  BMI 47.65 kg/m2     Review of Systems  Constitutional: Negative for fever, chills and diaphoresis.  HENT: Negative for ear pain, sore throat and trouble swallowing.   Eyes: Negative for photophobia and visual disturbance.  Respiratory: Negative for cough and choking.   Cardiovascular: Negative for chest pain and palpitations.  Gastrointestinal: Positive for rectal pain. Negative for nausea, vomiting, abdominal pain, diarrhea, constipation, blood in stool and anal bleeding.  Genitourinary: Negative for dysuria, frequency, decreased urine volume, vaginal bleeding, difficulty urinating and pelvic pain.  Musculoskeletal: Negative for myalgias and gait problem.  Skin: Negative for color change, pallor and rash.  Neurological: Negative for dizziness, speech difficulty, weakness and numbness.  Hematological: Negative for adenopathy.  Psychiatric/Behavioral: Negative for confusion and agitation. The patient is not nervous/anxious.        Objective:   Physical Exam  Constitutional: She is oriented to person, place,  and time. She appears well-developed and well-nourished. No distress.  HENT:  Head: Normocephalic.  Mouth/Throat: Oropharynx is clear and moist. No oropharyngeal exudate.  Eyes: Conjunctivae and EOM are normal. Pupils are equal, round, and reactive to light. No scleral icterus.  Neck: Normal range of motion. No tracheal deviation present.  Cardiovascular: Normal rate and intact distal pulses.   Pulmonary/Chest: Effort normal. No respiratory distress. She exhibits no tenderness.  Abdominal: Soft. She exhibits no distension. There is no tenderness. Hernia confirmed negative in the right inguinal area and confirmed negative in the left inguinal area.       Morbidly obese.  No hernias  Genitourinary: No vaginal discharge found.       Perianal skin clean with good hygiene.  No pruritis.  No pilonidal disease.  No fissure.  No abscess/fistula.    Normal sphincter tone.  Left ant skin tag 1x2cm   Musculoskeletal: Normal range of motion. She exhibits no tenderness.  Lymphadenopathy:       Right: No inguinal adenopathy present.       Left: No inguinal adenopathy present.  Neurological: She is alert and oriented to person, place, and time. No cranial nerve deficit. She exhibits normal muscle tone. Coordination normal.  Skin: Skin is warm and dry. No rash noted. She is not diaphoretic.  Psychiatric: She has a normal mood and affect. Her behavior is normal.       Assessment:     6 weeks status post [facility] hemorrhoidal ligation/pexy and external hemorrhoidectomies for extensive internal and external hemorrhoidal disease. Recovering    Plan:     She is getting better.  Continue bowel regimen. This is the most important thing to help her continue to heal and prevent new hemorrhoids  Persistent left posetrior skin tag. I do not think his is going to shrink down anymore. She may benefit from surgery to remove since it is pedunculated and it may affect hygiene. However, I did not force her on this. I  would wait further Until all raw tissue is healed.  Consider switching to cotton balls for itching and irritation. Analpram p.r.n. I would wait until the raw tissue has healed up before considering any more interventions. She agrees.  Return to clinic in 6-8 weeks to make sure she is continuing to improve. She expressed understanding.

## 2012-04-13 ENCOUNTER — Telehealth (INDEPENDENT_AMBULATORY_CARE_PROVIDER_SITE_OTHER): Payer: Self-pay

## 2012-04-13 NOTE — Telephone Encounter (Signed)
LMOM at home stating that there has been a change on 6-6 and we need to r/s her appt to 6-19.

## 2012-04-19 ENCOUNTER — Encounter (INDEPENDENT_AMBULATORY_CARE_PROVIDER_SITE_OTHER): Payer: Medicare Other | Admitting: Surgery

## 2012-04-24 ENCOUNTER — Ambulatory Visit: Payer: Medicare Other | Admitting: *Deleted

## 2012-04-26 ENCOUNTER — Encounter: Payer: Medicare Other | Attending: Family Medicine | Admitting: *Deleted

## 2012-04-26 ENCOUNTER — Encounter: Payer: Self-pay | Admitting: *Deleted

## 2012-04-26 DIAGNOSIS — E785 Hyperlipidemia, unspecified: Secondary | ICD-10-CM | POA: Insufficient documentation

## 2012-04-26 DIAGNOSIS — I10 Essential (primary) hypertension: Secondary | ICD-10-CM | POA: Insufficient documentation

## 2012-04-26 DIAGNOSIS — Z713 Dietary counseling and surveillance: Secondary | ICD-10-CM | POA: Insufficient documentation

## 2012-04-26 NOTE — Progress Notes (Signed)
  Medical Nutrition Therapy:  Appt start time: 0900  End time: 1000.  Assessment:  Morbid Obesity, HTN, HPL.  Pt here with BMI of 47.8 kg/m^2 for assessment. Reports multiple surgeries in last 3 years (knee, vertebrae, hemorrhoidectomy) and increased pain.  Pt reports she must lose 100 lbs before surgeon will perform breast reduction surgery to alleviate back pain. States she d/c'd all regular soda 6 months ago and has made many dietary changes since seeing surgeon. Is interested in weight loss surgery options. Advised she attend seminar at [facility]. Will discuss further at next visit.  MEDICATIONS:  See medication list.    DIETARY INTAKE:  Usual eating pattern includes 2 meals and 1-2 snacks per day.   24-hr recall:  B ( AM): Organic cereal - granola, raisins, cinnamon w/ almond milk and 1 pkt oatmeal or [place] sausage; sometimes 6 oz apple juice   Snk ( AM): None or Banana  L ( PM): Salmon patty, ([place]) garlic chicken, blueberry cobbler (1/2 c); water Snk ( PM): Chocolate chip cookie (3 or 4 - 4" ea)  D ( PM): Garlic chicken and salmon patty or unsalted saltine crackers  Snk ( PM): Fruit (prunes, bananas, oranges) or cookies Beverages: Water, some apple juice, Dr. Reino Kent (only 1x/month), carrot/prune juice  Usual physical activity: None d/t multiple surgeries in last 3 years. Walking increased; walks at [place], etc. Wants to join the [facility] for [place], but needs note from MD. Really enjoys the elliptical that is low to the ground.   Estimated energy needs: Maintenance calories: 2000-2100 per day Weight Loss (1.5-2 lbs/wk): 1200-1400 per day 150-175 g carbohydrates 75-85 g protein 35-40 g fat (<10 g as saturated fat)  Progress Towards Goal(s):  In progress.   Nutritional Diagnosis:  [place]-3.3 Overweight/obesity related to poor food choices as evidenced by previous dietary history and a BMI of 47.8 kg/m^2.    Intervention:  Nutrition intervention.  Goals:  Continue current dietary  changes.   Attend educational Bariatric Seminar at [facility].   Limit cookies to 1 at a time. :)  Increase exercise to 30 min as often a week as possible (try swimming and chair/band exercises).   Continue to aim for <2000 mg of sodium daily.  Handouts given during visit include:  Meal Public relations account executive  Low CHO Snack List  Low Sodium Flavoring Tips  Monitoring/Evaluation:  Dietary intake, exercise, and body weight in 4-6 week(s).

## 2012-04-26 NOTE — Patient Instructions (Addendum)
Goals:  Continue current dietary changes.   Attend educational Bariatric Seminar at [facility].   Limit cookies to 1 at a time. :)  Increase exercise to 30 min as often a week as possible (try swimming and chair/band exercises).   Continue to aim for <2000 mg of sodium daily.

## 2012-04-27 ENCOUNTER — Encounter (INDEPENDENT_AMBULATORY_CARE_PROVIDER_SITE_OTHER): Payer: Medicare Other | Admitting: Surgery

## 2012-05-02 ENCOUNTER — Emergency Department (INDEPENDENT_AMBULATORY_CARE_PROVIDER_SITE_OTHER)
Admission: EM | Admit: 2012-05-02 | Discharge: 2012-05-02 | Disposition: A | Discharge status: Home Health | Payer: Medicare Other | Source: Home / Self Care | Attending: Emergency Medicine | Admitting: Emergency Medicine

## 2012-05-02 ENCOUNTER — Emergency Department (HOSPITAL_COMMUNITY): Payer: Medicare Other

## 2012-05-02 ENCOUNTER — Encounter (HOSPITAL_COMMUNITY): Payer: Self-pay | Admitting: *Deleted

## 2012-05-02 ENCOUNTER — Emergency Department (INDEPENDENT_AMBULATORY_CARE_PROVIDER_SITE_OTHER): Payer: Medicare Other

## 2012-05-02 DIAGNOSIS — S8000XA Contusion of unspecified knee, initial encounter: Secondary | ICD-10-CM

## 2012-05-02 NOTE — Discharge Instructions (Signed)
Contusion  A contusion is a deep bruise. Contusions happen when an injury causes bleeding under the skin. Signs of bruising include pain, puffiness (swelling), and discolored skin. The contusion may turn blue, purple, or yellow.  HOME CARE    Put ice on the injured area.   Put ice in a plastic bag.   Place a towel between your skin and the bag.   Leave the ice on for 15 to 20 minutes, 3 to 4 times a day.   Only take medicine as told by your doctor.   Rest the injured area.   If possible, raise (elevate) the injured area to lessen puffiness.  GET HELP RIGHT AWAY IF:    You have more bruising or puffiness.   You have pain that is getting worse.   Your puffiness or pain is not helped by medicine.  MAKE SURE YOU:    Understand these instructions.   Will watch your condition.   Will get help right away if you are not doing well or get worse.  Document Released: 04/26/2008 Document Revised: 10/28/2011 Document Reviewed: 09/13/2011  ExitCare Patient Information 2012 [employer].

## 2012-05-02 NOTE — ED Provider Notes (Signed)
Medical screening examination/treatment/procedure(s) were performed by non-physician practitioner and as supervising physician I was immediately available for consultation/collaboration.  Leslee Home, M.D.   Reuben Likes, MD 05/02/12 2114
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
## 2012-05-02 NOTE — ED Provider Notes (Signed)
History     CSN: [id number]  Arrival date & time 05/02/12  1508   First MD Initiated Contact with Patient 05/02/12 1552      Chief Complaint  Patient presents with  . Knee Pain    (Consider location/radiation/quality/duration/timing/severity/associated sxs/prior treatment) Patient is a 56 y.o. female presenting with knee pain. The history is provided by the patient. No language interpreter was used.  Knee Pain This is a new problem. The current episode started yesterday. The problem occurs constantly. The symptoms are aggravated by walking. The symptoms are relieved by nothing. She has tried rest for the symptoms.  Pt fell yesterday and struck the left side of her knee.   Pt complains of pain.   Pt has swelling to left side of knee.   Pt had a knee replacement a year ago  Past Medical History  Diagnosis Date  . Seasonal allergies   . Asthma     History of  . Hypertension   . Iron deficiency 12-07-2011  . Myocardial infarction     unsure when  . Anemia   . GERD (gastroesophageal reflux disease)   . Heart murmur   . Osteoporosis   . PONV (postoperative nausea and vomiting)   . Dysrhythmia     occ palpitations   . Sleep apnea     no CPAP machine , borderine   . Headache     occasional   . Osteoarthritis     osteoarthritis   . Morbid obesity     Past Surgical History  Procedure Date  . Knee surgery     Both knees  . Total knee arthroplasty 04/29/2011    Left knee  . Multiple tooth extractions   . Wisdom tooth extraction   . Colonoscopy 2009  . Knee arthroscopy 1991  . Transanal hemorrhoidal dearterliaization 01/06/12    with external hemorrhoid removal    Family History  Problem Relation Age of Onset  . Colon cancer Neg Hx   . Diabetes Mother   . Hypertension Mother   . Heart disease Sister     Congestive heart failure  . Diabetes type II Sister   . Kidney disease Brother     History  Substance Use Topics  . Smoking status: Never Smoker   . Smokeless  tobacco: Never Used  . Alcohol Use: No    OB History    Grav Para Term Preterm Abortions TAB SAB Ect Mult Living                  Review of Systems  Musculoskeletal: Positive for joint swelling.  All other systems reviewed and are negative.    Allergies  Codeine; Hydrocodone; Latex; Oxycodone-acetaminophen; Peanut-containing drug products; and Penicillins  Home Medications   Current Outpatient Rx  Name Route Sig Dispense Refill  . ACETAMINOPHEN 500 MG PO TABS Oral Take 1,000 mg by mouth every 6 (six) hours as needed. Pain     . BISACODYL 5 MG PO TBEC Oral Take 5 mg by mouth daily as needed.    . CYCLOSPORINE 0.05 % OP EMUL Both Eyes Place 1 drop into both eyes 2 (two) times daily.    Marland Kitchen DICLOFENAC SODIUM 1 % TD GEL Topical Apply 1 application topically 2 (two) times daily as needed. TOPICAL TO KNEE, SHOULDER, WRIST, AND ANKLE PAIN    . ESOMEPRAZOLE MAGNESIUM 40 MG PO CPDR Oral Take 40 mg by mouth daily before breakfast.    . HYDROCHLOROTHIAZIDE 12.5 MG PO CAPS Oral  Take 12.5 mg by mouth daily.     . ADULT MULTIVITAMIN W/MINERALS CH Oral Take 0.5 tablets by mouth every other day.    . NEBIVOLOL HCL 5 MG PO TABS Oral Take 5 mg by mouth at bedtime.     Marland Kitchen PREGABALIN 50 MG PO CAPS Oral Take 50 mg by mouth daily.     Frazier Butt BALANCE OP Both Eyes Place 2 drops into both eyes daily.     Marland Kitchen VALSARTAN 160 MG PO TABS Oral Take 160 mg by mouth 2 (two) times daily.     Marland Kitchen VITAMIN B-12 500 MCG PO TABS Oral Take 500 mcg by mouth daily.    . ALBUTEROL SULFATE HFA 108 (90 BASE) MCG/ACT IN AERS Inhalation Inhale 2 puffs into the lungs every 6 (six) hours as needed. Wheezing     . FLUTICASONE FUROATE 27.5 MCG/SPRAY NA SUSP Nasal Place 2 sprays into the nose daily.      BP 148/62  Pulse 97  Temp(Src) 99.4 F (37.4 C) (Oral)  Resp 20  SpO2 99%  Physical Exam  Nursing note and vitals reviewed. Constitutional: She is oriented to person, place, and time. She appears well-developed and  well-nourished.  Musculoskeletal: She exhibits tenderness.       Tender left lateral knee,  Decreased range of motion,    Neurological: She is alert and oriented to person, place, and time. She has normal reflexes.  Skin: Skin is warm.  Psychiatric: She has a normal mood and affect.    ED Course  Procedures (including critical care time)  Labs Reviewed - No data to display Dg Knee Complete 4 Views Left  05/02/2012  *RADIOLOGY REPORT*  Clinical Data: Knee pain  LEFT KNEE - COMPLETE 4+ VIEW  Comparison: None.  Findings:  The patient is status post left total knee arthroplasty. The hardware components are in anatomic alignment.  No complicating features identified.  There is no fracture or subluxation identified.  IMPRESSION:  1.  No acute findings  Original Report Authenticated By: Rosealee Albee, M.D.     1. Knee contusion       MDM     I advised follow up with Dr. Charlann Boxer for evaluation,  Use walker,  Pt has hydrocodone at home for pain.       Lonia Skinner [place], [place] 05/02/12 1740

## 2012-05-10 ENCOUNTER — Encounter (INDEPENDENT_AMBULATORY_CARE_PROVIDER_SITE_OTHER): Payer: Self-pay | Admitting: Surgery

## 2012-05-10 ENCOUNTER — Ambulatory Visit (INDEPENDENT_AMBULATORY_CARE_PROVIDER_SITE_OTHER): Payer: Medicare Other | Admitting: Surgery

## 2012-05-10 VITALS — BP 130/75 | HR 76 | Temp 97.7°F | Ht 68.0 in | Wt 313.4 lb

## 2012-05-10 DIAGNOSIS — K648 Other hemorrhoids: Secondary | ICD-10-CM

## 2012-05-10 DIAGNOSIS — K644 Residual hemorrhoidal skin tags: Secondary | ICD-10-CM

## 2012-05-10 NOTE — Patient Instructions (Addendum)
HEMORRHOIDS   The rectum is the last few inches of your colon, and it naturally stretches to hold stool.  Hemorrhoidal piles are natural clusters of blood vessels that help the rectum stretch to hold stool and allow bowel movements to eliminate feces.  Hemorrhoids are abnormally swollen blood vessels in the rectum.  Too much pressure in the rectum causes hemorrhoids by forcing blood to stretch and bulge the walls of the veins, sometimes even rupturing them.  Hemorrhoids can become like varicose veins you might see on a person's legs. When bulging hemorrhoidal veins are irritated, they can swell, burn, itch, become very painful, and bleed. Once the rectal veins have been stretched out and hemorrhoids created, they are difficult to get rid of completely and tend to recur with less straining than it took to cause them in the first place. Fortunately, good habits and simple medical treatment usually control hemorrhoids well, and surgery is only recommended in unusually severe cases. Some of the most frequent causes of hemorrhoids:    Constant sitting    Straining with bowel movements (from constipation or hard stools)    Diarrhea    Sitting on the toilet for a long time    Severe coughing    Childbirth    Heavy Lifting  Types of Hemorrhoids:    Internal hemorrhoids usually don't hurt or itch; they are deep inside the rectum and usually have no sensation. However, internal hemorrhoids can bleed.  Such bleeding should not be ignored and mask blood from a dangerous source like colorectal cancer, so persistent rectal bleeding should be investigated with a colonoscopy.    External hemorrhoids cause most of the symptoms - pain, burning, and itching. Unirritated hemorrhoids can look like small skin tags coming out of the anus.     Thrombosed hemorrhoids can form when a hemorrhoid blood vessel bursts and causes the hemorrhoid to swell.  A purple blood clot can form in it and become an excruciatingly painful lump  at the anus. Because of these unpleasant symptoms, immediate incision and drainage by a surgeon at an office visit can provide much relief of the pain.    PREVENTION Avoiding the causes listed in above will prevent most cases of hemorrhoids, but this advice is sometimes hard to follow:  How can you avoid sitting all day if you have a seated job? Also, we try to avoid coughing and diarrhea, but sometimes it's beyond your control.  Still, there are some practical hints to help:    If your main job activity is seated, always stand or walk during your breaks. Make it a point to stand and walk at least 5 minutes every hour and try to shift frequently in your chair to avoid direct rectal pressure.    Always exhale as you strain or lift. Don't hold your breath.    Treat coughing, diarrhea and constipation early since irritated hemorrhoids may soon follow.    Do not delay or try to prevent a bowel movement when the urge is present.   Exercise regularly (walking or jogging 60 minutes a day) to stimulate the bowels to move.   Avoid dry toilet paper when cleaning after bowel movements.  Moistened tissues such as baby wipes are less irritating.  Lightly pat the rectal area dry.  Using irrigating showers or bottle irrigation washing can more gently clean this sensitive area.   Keep the anal and genital area clean and  dry.  Talcum or baby powders can help   GET   YOUR STOOLS SOFT.   This is the most important way to prevent irritated hemorrhoids.  Hard stools are like sandpaper to the anorectal canal and will cause more problems.   The goal: ONE SOFT BOWEL MOVEMENT A DAY!  To have soft, regular bowel movements:    Drink at least 8 tall glasses of water a day.     AVOID CONSTIPATION    Take plenty of fiber.  Fiber is the undigested part of plant food that passes into the colon, acting s "natures broom" to encourage bowel motility and movement.  Fiber can absorb and hold large amounts of water. This results in a  larger, bulkier stool, which is soft and easier to pass. Work gradually over several weeks up to 6 servings a day of fiber (25g a day even more if needed) in the form of: o Vegetables -- Root (potatoes, carrots, turnips), leafy green (lettuce, salad greens, celery, spinach), or cooked high residue (cabbage, broccoli, etc) o Fruit -- Fresh (unpeeled skin & pulp), Dried (prunes, apricots, cherries, etc ),  or stewed ( applesauce)  o Whole grain breads, pasta, etc (whole wheat)  o Bran cereals    Bulking Agents -- This type of water-retaining fiber generally is easily obtained each day by one of the following:  o Psyllium bran -- The psyllium plant is remarkable because its ground seeds can retain so much water. This product is available as Metamucil, Konsyl, Effersyllium, Per Diem Fiber, or the less expensive generic preparation in drug and health food stores. Although labeled a laxative, it really is not a laxative.  o Methylcellulose -- This is another fiber derived from wood which also retains water. It is available as Citrucel. o Polyethylene Glycol - and "artificial" fiber commonly called Miralax or Glycolax.  It is helpful for people with gassy or bloated feelings with regular fiber o Flax Seed - a less gassy fiber than psyllium   No reading or other relaxing activity while on the toilet. If bowel movements take longer than 5 minutes, you are too constipated   Laxatives can be useful for a short period if constipation is severe o Osmotics (Milk of Magnesia, Fleets phosphosoda, Magnesium citrate, MiraLax, GoLytely) are safer than  o Stimulants (Senokot, Castor Oil, Dulcolax, Ex Lax)    o Do not take laxatives for more than 7days in a row.   Laxatives are not a good long-term solution as it can stress the intestine and colon and causes too much mineral and fluid losses.    If badly constipated, try a Bowel Retraining Program: o Do not use laxatives.  o Eat a diet high in roughage, such as bran  cereals and leafy vegetables.  o Drink six (6) ounces of prune or apricot juice each morning.  o Eat two (2) large servings of stewed fruit each day.  o Take one (1) heaping dose of a bulking agent (ex. Metamucil, Citrucel, Miralax) twice a day.  o Use sugar-free sweetener when possible to avoid excessive calories.  o Eat a normal breakfast.  o Set aside 15 minutes after breakfast to sit on the toilet, but do not strain to have a bowel movement.  o If you do not have a bowel movement by the third day, use an enema and repeat the above steps.    AVOID DIARRHEA o Switch to liquids and simpler foods for a few days to avoid stressing your intestines further. o Avoid dairy products (especially milk & ice cream) for a short   time.  The intestines often can lose the ability to digest lactose when stressed. o Avoid foods that cause gassiness or bloating.  Typical foods include beans and other legumes, cabbage, broccoli, and dairy foods.  Every person has some sensitivity to other foods, so listen to our body and avoid those foods that trigger problems for you. o Adding fiber (Citrucel, Metamucil, psyllium, Miralax) gradually can help thicken stools by absorbing excess fluid and retrain the intestines to act more normally.  Slowly increase the dose over a few weeks.  Too much fiber too soon can backfire and cause cramping & bloating. o Probiotics (such as active yogurt, Align, etc) may help repopulate the intestines and colon with normal bacteria and calm down a sensitive digestive tract.  Most studies show it to be of mild help, though, and such products can be costly. o Medicines:   Bismuth subsalicylate (ex. Kayopectate, Pepto Bismol) every 30 minutes for up to 6 doses can help control diarrhea.  Avoid if pregnant.   Loperamide (Immodium) can slow down diarrhea.  Start with two tablets (4mg  total) first and then try one tablet every 6 hours.  Avoid if you are having fevers or severe pain.  If you are not  better or start feeling worse, stop all medicines and call your doctor for advice o Call your doctor if you are getting worse or not better.  Sometimes further testing (cultures, endoscopy, X-ray studies, bloodwork, etc) may be needed to help diagnose and treat the cause of the diarrhea.   If these preventive measures fail, you must take action right away! Hemorrhoids are one condition that can be mild in the morning and become intolerable by nightfall.     Bariatric Surgery (Gastrointestinal Surgery for Severe Obesity) Severe obesity is a longstanding condition. It is difficult to treat through diet and exercise alone. Gastrointestinal surgery is the best option for people who are severely obese and cannot lose weight by traditional means, or who suffer from serious obesity-related health problems. The surgery promotes weight loss by decreasing the absorption of food and, in some operations, interrupting the digestive process. As in other treatments for obesity, the best results are achieved with healthy eating behaviors and regular physical activity.  People who may consider gastrointestinal surgery include those with a body mass index (BMI) above 40. This is about 100 pounds of overweight for men and 80 pounds for women. People with a BMI between 35 and 40 and who suffer from type 2 diabetes or life-threatening cardiopulmonary (heart and lung) problems, such as severe sleep apnea or obesity-related heart disease, may also be candidates for surgery. (To use the Body Mass Index chart. find your weight on the bottom of the graph. Go straight up from that point until you come to the line that matches your height. Then look to find your weight group). The idea of gastrointestinal surgery to control obesity grew out of results of operations for cancer or severe ulcers that removed large portions of the stomach or small intestine. Patients undergoing these procedures tended to lose weight after surgery. So  some physicians began to use such operations to treat severe obesity. The first operation that was widely used for severe obesity was the intestinal bypass. This operation was first used 40 years ago. It produced weight loss by causing malabsorption. The idea was that patients could eat large amounts of food, which would be poorly digested or passed along too fast for the body to absorb many calories. The problem  with this surgery was that it caused a loss of essential nutrients. Also, its side effects were unpredictable and sometimes fatal. The original form of the intestinal bypass operation is no longer used. THE NORMAL DIGESTIVE PROCESS Normally, as food moves along the digestive tract, digestive juices and enzymes digest and absorb calories and nutrients. After we chew and swallow our food, it moves down the esophagus to the stomach. There a strong acid continues the digestive process. The stomach can hold about 3 pints of food at one time. When the stomach contents move to the first portion of the small intestine (duodenum ), bile and pancreatic juice speed up digestion. Most of the iron and calcium in the foods we eat is absorbed in the duodenum. The jejunum and ileum are the remaining two segments of the nearly 20 feet of small intestine. They complete the absorption of almost all calories and nutrients. The food particles that cannot be digested in the small intestine are stored in the large intestine until eliminated.  HOW DOES SURGERY PROMOTE WEIGHT LOSS? Gastrointestinal surgery for obesity is also called bariatric surgery. It alters the digestive process. The operations promote weight loss by closing off parts of the stomach. This will make it smaller. Operations that only reduce stomach size are known as "restrictive operations". They restrict the amount of food the stomach can hold. Some operations combine stomach restriction with a partial bypass of the small intestine. These procedures create a  direct connection from the stomach to the lower segment of the small intestine. This causes bypassing portions of the digestive tract that absorb calories and nutrients. These are known as malabsorptive operations. WHAT ARE THE SURGICAL OPTIONS? There are several types of restrictive and malabsorptive operations. Each one carries its own benefits and risks.  Restrictive Operations  Restrictive operations serve only to restrict food intake. They do not interfere with the normal digestive process. To perform the surgery, doctors create a small pouch at the top of the stomach where food enters from the esophagus. At first, the pouch holds about 1 ounce of food. It later expands to 2-3 ounces. The lower outlet of the pouch usually has a diameter of only about  inch. This small outlet delays the emptying of food from the pouch and causes a feeling of fullness. As a result of this surgery, most people lose the ability to eat large amounts of food at one time. After an operation, the person usually can eat only  to 1 cup of food without discomfort or nausea. Also, food has to be well chewed. Restrictive operations for obesity include adjustable gastric banding (AGB) and vertical banded gastroplasty (VBG).   Adjustable gastric banding  In this procedure, a hollow band made of special material is placed around the stomach near its upper end. This creates a small pouch and a narrow passage into the larger remainder of the stomach. The band is then inflated with a salt solution. It can be tightened or loosened over time to change the size of the passage by increasing or decreasing the amount of salt solution.   The band is adjusted based on feelings of hunger and weight loss. Patients decide when they need an adjustment and come to their surgeons to evaluate this. The adjustment is done as an office visit. The band is fully reversible with a second surgery if the patient changes his/her mind. There is no cutting or  re-routing of the intestine.   Vertical banded gastroplasty  VBG has been  the most common restrictive operation for weight control. Both a band and staples are used to create a small stomach pouch. Vertical banded gastroplasty is based on the same principle of restriction as the band. But the stomach is surgically altered with the stapling. This treatment is not reversible.   Restrictive operations lead to weight loss in almost all patients. But they are less successful than malabsorptive operations in achieving substantial, long-term weight loss. About 30 percent of those who undergo VBG achieve normal weight. About 80 percent achieve some degree of weight loss. Some patients regain weight. Others are unable to adjust their eating habits and fail to lose the desired weight. Successful results depend on the patient's willingness to adopt a long-term plan of healthy eating and regular physical activity.   A common risk of restrictive operations is vomiting. This is caused when the small stomach is overly stretched by food particles that have not been chewed well. Band slippage and saline leakage have been reported after AGB. Risks of VBG include wearing away of the band and breakdown of the staple line. In a small number of cases, stomach juices may leak into the abdomen. This requires an emergency operation. In less than 1 percent of all cases, infection or death from complications may occur.  Malabsorptive Operations  Malabsorptive operations are the most common gastrointestinal surgeries for weight loss. They restrict both food intake and the amount of calories and nutrients the body absorbs.   Roux-en-Y gastric bypass (RGB)  This operation is the most common and successful malabsorptive surgery. First, a small stomach pouch is created to restrict food intake. Next, a Y-shaped section of the small intestine is attached to the pouch. This allows food to bypass the lower stomach, the first segment of the  small intestine (duodenum), and the first portion of the jejunum (the second segment of the small intestine). This bypass reduces the amount of calories and nutrients the body absorbs.   Biliopancreatic diversion (BPD)  In this more complicated malabsorptive operation, portions of the stomach are removed. The small pouch that remains is connected directly to the final segment of the small intestine, completely bypassing the duodenum and the jejunum. This procedure successfully promotes weight loss. But it is less frequently used than other types of surgery because of the high risk for nutritional deficiencies. A variation of BPD includes a "duodenal switch". This leaves a larger portion of the stomach intact, including the pyloric valve. This valve regulates the release of stomach contents into the small intestine. It also keeps a small part of the duodenum in the digestive pathway.   Malabsorptive operations produce more weight loss than restrictive operations. And they are more effective in reversing the health problems associated with severe obesity. Patients who have malabsorptive operations generally lose two-thirds of their excess weight within 2 years.   In addition to the risks of restrictive surgeries, malabsorptive operations also carry greater risk for nutritional deficiencies. This is because the procedure causes food to bypass the duodenum and jejunum. That is where most iron and calcium are absorbed. Menstruating women may develop anemia because not enough vitamin B12 and iron are absorbed. Decreased absorption of calcium may also bring on osteoporosis and metabolic bone disease. Patients are required to take nutritional supplements that usually prevent these deficiencies. Patients who have the biliopancreatic diversion surgery must also take fat-soluble (dissolved by fat) vitamins A, D, E, and K supplements.   RGB and BPD operations may also cause "dumping syndrome". This means  that stomach  contents move too rapidly through the small intestine. Symptoms include nausea, weakness, sweating, faintness, and sometimes diarrhea after eating. The duodenal switch operation keeps the pyloric valve intact. So it may reduce the likelihood of dumping syndrome.   The more extensive the bypass, the greater the risk is for complications and nutritional deficiencies. Patients with extensive bypasses of the normal digestive process require close monitoring. They also need life-long use of special foods, supplements, and medications.  EXPLORE BENEFITS AND RISKS Surgery to produce weight loss is a serious undertaking. Anyone thinking about surgery should understand what the operation involves. Patients and physicians should carefully consider the following benefits and risks.  Benefits  Right after surgery, most patients lose weight quickly. They continue to lose for 18 to 24 months after the procedure. Most patients regain 5 to 10 percent of the weight they lost. But many maintain a long-term weight loss of about 100 pounds.   Surgery improves most obesity-related conditions. For example, in one study blood sugar levels of 83 percent of obese patients with diabetes returned to normal after surgery. Nearly all patients whose blood sugar levels did not return to normal were older. Or they had lived with diabetes for a long time.  Risks  Ten to 20 percent of patients who have weight-loss surgery require follow-up operations to correct complications. Abdominal hernia was the most common complication requiring follow-up surgery. But laparoscopic techniques seem to have solved this problem. In laparoscopy, the surgeon makes one or more small incisions. Slender surgical instruments are passed them. This technique eliminates the need for a large incision. And it creates less tissue damage. Patients who are super obese (greater than 350 pounds) or have had previous abdominal surgery, may not be good candidates for  laparoscopy. Less common complications include breakdown of the staple line and stretched stomach outlets.   Some obese patients who have weight-loss surgery develop gallstones. These are clumps of cholesterol and other matter that form in the gallbladder. During quick or substantial weight loss, one's risk of developing gallstones increases. Taking supplemental bile salts for the first 6 months after surgery can prevent them.   Nearly 30 percent of patients who have weight-loss surgery develop nutritional deficiencies. These include anemia, osteoporosis, and metabolic bone disease. These usually can be avoided if vitamin and mineral intakes are high enough.   Women of childbearing age should avoid pregnancy until their weight becomes stable. Quick weight loss and nutritional deficiencies can harm a growing fetus.   Other risks of restrictive surgeries include:   Band slippage.   Stomach prolapse.   Band erosion into the lumen of the stomach.   Port infection.   The main risk with malabsorption operations is life threatening. It is the risk of leak from any of the anastomosis. The more involved the operation, the more risk involved.   There is one other risk of having the surgery. If people do not follow a strict diet, they will stretch out their stomach pouches. Then they will not lose weight.  MEDICAL COSTS Gastrointestinal surgery costs vary. They depend on the procedure. Medical insurance coverage varies by state and insurance provider. If you are considering gastrointestinal surgery, contact your r egional Medicare or Medicaid office or your insurance plan. Find out from them if the procedure is covered. IS THE SURGERY FOR YOU?  Gastrointestinal surgery may be the next step for people who remain severely obese after trying nonsurgical approaches or have an obesity-related disease. Candidates for surgery  have:   A BMI of 40 or more.   A BMI of 35 or more and a life-threatening  obesity-related health problem such as:   Diabetes.   Severe sleep apnea.   Heart disease.   Obesity-related physical problems that interfere with:   Employment.   Walking.   Family function.  If you fit the profile for surgery, answers to these questions may help you decide whether weight-loss surgery is appropriate for you. Are you:  Unlikely to lose weight successfully without surgery?   Well informed about the surgical procedure? The effects of treatment?   Determined to lose weight? Improve your health?   Aware of how your life may change after the operation? Adjustment to the side effects of the surgery include the need to chew well and being unable to eat large meals.   Aware of the potential for serious complications? Dietary restrictions? Occasional failures?   Committed to lifelong medical follow-up?   Restrictive operations are very successful with patients who follow a diet created by a dietician. Support groups and follow up with caregivers is important.  Remember: There are no guarantees for any method to produce and maintain weight loss. This includes surgery. Success is possible only with:  Maximum cooperation.   Commitment to behavioral change.   Medical follow-up.  This cooperation and commitment must be carried out for the rest of your life.  ADDITIONAL RESOURCES [employer] [address], [address] [place], [place] [address] www.asmbs.org  [employer] (WIN) [address] [place] [address] [employer] Document Released: 11/08/2005 Document Revised: 10/28/2011 Document Reviewed: 02/01/2007 [facility] Patient Information 2012 [place], [place].

## 2012-05-10 NOTE — Progress Notes (Signed)
Subjective:     Patient ID: [name], female   DOB: 03/02/1956, 56 y.o.   MRN: [id number]  HPI   NIOMIE ENGLERT  Mar 18, 1956 [id number]  Patient Care Team: Norberto Sorenson, MD as PCP - General (Family Medicine) Louis Meckel, MD as Consulting Physician (Gastroenterology)  This patient is a 56 y.o.female who presents today for surgical evaluation.   Procedure: THD hemorrhoidal ligation and pexy. External hemorrhoidectomy x2.  01/06/2012  Patient comes in today feeling "pretty good, I guess.". She is urinating fine. No hematuria.  Rectal bleeding is gone.  No pain w defecation/sitting/etc.  Rare itching if not clean - much less an issue now.  She's having 2 small but soft bowel movements a day. Not hurting to defecate.  She is concerned about her weight & is interested in pursuing LapBand surgery.  Using a dietitian  Patient Active Problem List  Diagnosis  . HYPERLIPIDEMIA  . OBESITY  . SLEEP APNEA, OBSTRUCTIVE, MILD  . TINNITUS, LEFT  . HYPERTENSION  . SINUSITIS  . ALLERGIC RHINITIS  . ASTHMA  . DENTAL PAIN  . GERD  . CONSTIPATION  . HEMATURIA UNSPECIFIED  . POSTMENOPAUSAL SYNDROME  . Unspecified Pruritic Disorder  . Pain in joint, lower leg  . BUNIONS, BILATERAL  . Iron deficiency anemia, unspecified  . Hemorrhoids, internal, with bleeding & prolapse  . Chronic pain  . EXTERNAL HEMORRHOIDS  . Obesity, Class III, BMI 40-49.9 (morbid obesity)    Past Medical History  Diagnosis Date  . Seasonal allergies   . Asthma     History of  . Hypertension   . Iron deficiency 12-07-2011  . Myocardial infarction     unsure when  . Anemia   . GERD (gastroesophageal reflux disease)   . Heart murmur   . Osteoporosis   . PONV (postoperative nausea and vomiting)   . Dysrhythmia     occ palpitations   . Sleep apnea     no CPAP machine , borderine   . Headache     occasional   . Osteoarthritis     osteoarthritis   . Morbid obesity   . Acute urinary retention s/p Foley  01/07/2012    Past Surgical History  Procedure Date  . Knee surgery     Both knees  . Total knee arthroplasty 04/29/2011    Left knee  . Multiple tooth extractions   . Wisdom tooth extraction   . Colonoscopy 2009  . Knee arthroscopy 1991  . Transanal hemorrhoidal dearterliaization 01/06/12    with external hemorrhoid removal    History   Social History  . Marital Status: Single    Spouse Name: N/A    Number of Children: 2  . Years of Education: N/A   Occupational History  . Retired    Social History Main Topics  . Smoking status: Never Smoker   . Smokeless tobacco: Never Used  . Alcohol Use: No  . Drug Use: No  . Sexually Active: Not on file   Other Topics Concern  . Not on file   Social History Narrative  . No narrative on file    Family History  Problem Relation Age of Onset  . Colon cancer Neg Hx   . Diabetes Mother   . Hypertension Mother   . Heart disease Sister     Congestive heart failure  . Diabetes type II Sister   . Kidney disease Brother     Current outpatient prescriptions:acetaminophen (TYLENOL) 500 MG tablet,  Take 1,000 mg by mouth every 6 (six) hours as needed. Pain , Disp: , Rfl: ;  albuterol (PROVENTIL HFA;VENTOLIN HFA) 108 (90 BASE) MCG/ACT inhaler, Inhale 2 puffs into the lungs every 6 (six) hours as needed. Wheezing , Disp: , Rfl: ;  cycloSPORINE (RESTASIS) 0.05 % ophthalmic emulsion, Place 1 drop into both eyes 2 (two) times daily., Disp: , Rfl:  diclofenac sodium (VOLTAREN) 1 % GEL, Apply 1 application topically 2 (two) times daily as needed. TOPICAL TO KNEE, SHOULDER, WRIST, AND ANKLE PAIN, Disp: , Rfl: ;  esomeprazole (NEXIUM) 40 MG capsule, Take 40 mg by mouth daily before breakfast., Disp: , Rfl: ;  fluticasone (VERAMYST) 27.5 MCG/SPRAY nasal spray, Place 2 sprays into the nose daily., Disp: , Rfl:  hydrochlorothiazide (MICROZIDE) 12.5 MG capsule, Take 12.5 mg by mouth daily. , Disp: , Rfl: ;  Multiple Vitamin (MULITIVITAMIN WITH MINERALS)  TABS, Take 0.5 tablets by mouth every other day., Disp: , Rfl: ;  nebivolol (BYSTOLIC) 5 MG tablet, Take 5 mg by mouth at bedtime. , Disp: , Rfl: ;  pregabalin (LYRICA) 50 MG capsule, Take 50 mg by mouth daily. , Disp: , Rfl:  valsartan (DIOVAN) 160 MG tablet, Take 160 mg by mouth 2 (two) times daily. , Disp: , Rfl: ;  vitamin B-12 (CYANOCOBALAMIN) 500 MCG tablet, Take 500 mcg by mouth daily., Disp: , Rfl:   Allergies  Allergen Reactions  . Codeine Other (See Comments)    hallucinations  . Hydrocodone Other (See Comments)    Constipation   . Latex Itching  . Oxycodone-Acetaminophen Other (See Comments)    Constipation   . Peanut-Containing Drug Products Swelling  . Penicillins     Pt reports no longer allergic    BP 130/75  Pulse 76  Temp 97.7 F (36.5 C) (Temporal)  Ht 5\' 8"  (1.727 m)  Wt 313 lb 6.4 oz (142.157 kg)  BMI 47.65 kg/m2  SpO2 98%     Review of Systems  Constitutional: Negative for fever, chills and diaphoresis.  HENT: Negative for ear pain, sore throat and trouble swallowing.   Eyes: Negative for photophobia and visual disturbance.  Respiratory: Negative for cough and choking.   Cardiovascular: Negative for chest pain and palpitations.  Gastrointestinal: Positive for rectal pain. Negative for nausea, vomiting, abdominal pain, diarrhea, constipation, blood in stool and anal bleeding.  Genitourinary: Negative for dysuria, frequency, decreased urine volume, vaginal bleeding, difficulty urinating and pelvic pain.  Musculoskeletal: Negative for myalgias and gait problem.  Skin: Negative for color change, pallor and rash.  Neurological: Negative for dizziness, speech difficulty, weakness and numbness.  Hematological: Negative for adenopathy.  Psychiatric/Behavioral: Negative for confusion and agitation. The patient is not nervous/anxious.        Objective:   Physical Exam  Constitutional: She is oriented to person, place, and time. She appears well-developed  and well-nourished. No distress.  HENT:  Head: Normocephalic.  Mouth/Throat: Oropharynx is clear and moist. No oropharyngeal exudate.  Eyes: Conjunctivae and EOM are normal. Pupils are equal, round, and reactive to light. No scleral icterus.  Neck: Normal range of motion. No tracheal deviation present.  Cardiovascular: Normal rate and intact distal pulses.   Pulmonary/Chest: Effort normal. No respiratory distress. She exhibits no tenderness.  Abdominal: Soft. She exhibits no distension. There is no tenderness. Hernia confirmed negative in the right inguinal area and confirmed negative in the left inguinal area.       Morbidly obese.  No hernias  Genitourinary: No vaginal discharge found.  Perianal skin clean with good hygiene.  No pruritis.  No pilonidal disease.  No fissure.  No abscess/fistula.    Normal sphincter tone.  R ant ext hemorrhoid 2x1cm unchanged  Musculoskeletal: Normal range of motion. She exhibits no tenderness.  Lymphadenopathy:       Right: No inguinal adenopathy present.       Left: No inguinal adenopathy present.  Neurological: She is alert and oriented to person, place, and time. No cranial nerve deficit. She exhibits normal muscle tone. Coordination normal.  Skin: Skin is warm and dry. No rash noted. She is not diaphoretic.  Psychiatric: She has a normal mood and affect. Her behavior is normal.       Assessment:     4 months status post [facility] hemorrhoidal ligation/pexy and external hemorrhoidectomies for extensive internal and external hemorrhoidal disease. Recovering    Plan:    Continue bowel regimen. This is the most important thing to help her continue to heal and prevent new hemorrhoids  Persistent R ant ext hemorrhoid. I do not think his is going to shrink down anymore. She may benefit from surgery to remove since it is pedunculated and it may affect hygiene. However, I did not force her on this.  Bleeding & pain resolve.  Mild itching/irritation at  most.   I would wait further Until all raw tissue is healed.  Consider switching to cotton balls for itching and irritation.   Analpram p.r.n. .  We gave her dates for Bariatric information seminars.  I discussed Bariatric surgery indications, risks, alternatives, benefits, ec.  She will consider pursuing it  Return to clinic PRN

## 2012-05-15 ENCOUNTER — Other Ambulatory Visit (HOSPITAL_COMMUNITY): Payer: Self-pay | Admitting: Family Medicine

## 2012-05-15 ENCOUNTER — Ambulatory Visit (HOSPITAL_COMMUNITY)
Admission: RE | Admit: 2012-05-15 | Discharge: 2012-05-15 | Disposition: A | Discharge status: Home / Self Care | Payer: Medicare Other | Source: Ambulatory Visit | Attending: Family Medicine | Admitting: Family Medicine

## 2012-05-15 DIAGNOSIS — R270 Ataxia, unspecified: Secondary | ICD-10-CM

## 2012-05-15 DIAGNOSIS — R279 Unspecified lack of coordination: Secondary | ICD-10-CM | POA: Insufficient documentation

## 2012-06-07 ENCOUNTER — Encounter: Payer: Self-pay | Admitting: *Deleted

## 2012-06-07 ENCOUNTER — Encounter: Payer: Medicare Other | Attending: Family Medicine | Admitting: *Deleted

## 2012-06-07 VITALS — Ht 68.0 in | Wt 315.8 lb

## 2012-06-07 DIAGNOSIS — Z713 Dietary counseling and surveillance: Secondary | ICD-10-CM | POA: Insufficient documentation

## 2012-06-07 DIAGNOSIS — E785 Hyperlipidemia, unspecified: Secondary | ICD-10-CM | POA: Insufficient documentation

## 2012-06-07 DIAGNOSIS — I10 Essential (primary) hypertension: Secondary | ICD-10-CM | POA: Insufficient documentation

## 2012-06-07 NOTE — Progress Notes (Signed)
  Medical Nutrition Therapy:  Appt start time: 0900  End time: 1000.  Primary Concerns Today:  Morbid Obesity, HTN, HPL; F/U  Reports recently taking a 12 day course of prednisone d/t increased inflammation. Increased exercise noted. Has decreased intake of high sugar foods and maintained weight during prednisone treatment. Further discussed surgical weight loss treatments.  MEDICATIONS:  See medication list.    DIETARY INTAKE:  Usual eating pattern includes 2 meals and 1-2 snacks per day.   24-hr recall:  B ( AM): Raisin Bran w/ almond milk; 3 oz smoothie  Snk ( AM): Gummy fruit snacks   L ( PM): Salmon patty, ([place]) garlic chicken, water OR smoothie w/ unsweetened fruit, turmeric (1/2 tsp) and celery, kale - drinks about 3 oz at a time.   Snk ( PM): Chocolate chip cookies (decreased to 2 q3d) D ( PM): Garlic chicken and salmon patty or unsalted saltine crackers  Snk ( PM): Oatmeal w/ blueberries Beverages: Water, some apple juice, Dr. Reino Kent (only 1x/month), carrot/prune juice  Usual physical activity:  Walking increased; walks one end of mall to the other 3x/wk.   Estimated energy needs: Maintenance calories: 2000-2100 per day Weight Loss (1.5-2 lbs/wk): 1200-1400 per day 150-175 g carbohydrates 75-85 g protein 35-40 g fat (<10 g as saturated fat)  Progress Towards Goal(s):  In progress.   Nutritional Diagnosis:  Gardner-3.3 Overweight/obesity related to poor food choices as evidenced by previous dietary history and a BMI of 47.8 kg/m^2.    Intervention:  Nutrition intervention.  Goals:  Continue current dietary changes.   Attend educational Bariatric Seminar at [facility].   Limit cookies to 1 at a time. :)  Increase exercise to 30 min as often a week as possible (try swimming and chair/band exercises).   Continue to aim for <2000 mg of sodium daily.  Handouts given during visit include:  GERD Nutrition Therapy  Bariatric Surgery Program (booklet from  seminar)  Monitoring/Evaluation:  Dietary intake, exercise, and body weight in 3 month(s).

## 2012-06-07 NOTE — Patient Instructions (Signed)
Goals:  Continue current dietary changes.   Attend educational Bariatric Seminar at [facility].   Limit cookies and sweets. :)  Increase exercise to 30 min as often a week as possible (try swimming and chair/band exercises).   Continue to aim for <2000 mg of sodium daily.

## 2012-06-11 ENCOUNTER — Encounter (HOSPITAL_COMMUNITY): Payer: Self-pay | Admitting: *Deleted

## 2012-06-11 ENCOUNTER — Emergency Department (HOSPITAL_COMMUNITY)
Admission: EM | Admit: 2012-06-11 | Discharge: 2012-06-11 | Disposition: A | Discharge status: Home / Self Care | Payer: Medicare Other | Attending: Emergency Medicine | Admitting: Emergency Medicine

## 2012-06-11 ENCOUNTER — Emergency Department (HOSPITAL_COMMUNITY): Payer: Medicare Other

## 2012-06-11 DIAGNOSIS — Z79899 Other long term (current) drug therapy: Secondary | ICD-10-CM | POA: Insufficient documentation

## 2012-06-11 DIAGNOSIS — Z9109 Other allergy status, other than to drugs and biological substances: Secondary | ICD-10-CM | POA: Insufficient documentation

## 2012-06-11 DIAGNOSIS — K219 Gastro-esophageal reflux disease without esophagitis: Secondary | ICD-10-CM | POA: Insufficient documentation

## 2012-06-11 DIAGNOSIS — N390 Urinary tract infection, site not specified: Secondary | ICD-10-CM | POA: Insufficient documentation

## 2012-06-11 DIAGNOSIS — R002 Palpitations: Secondary | ICD-10-CM | POA: Insufficient documentation

## 2012-06-11 DIAGNOSIS — Z7982 Long term (current) use of aspirin: Secondary | ICD-10-CM | POA: Insufficient documentation

## 2012-06-11 DIAGNOSIS — I10 Essential (primary) hypertension: Secondary | ICD-10-CM | POA: Insufficient documentation

## 2012-06-11 DIAGNOSIS — J45909 Unspecified asthma, uncomplicated: Secondary | ICD-10-CM | POA: Insufficient documentation

## 2012-06-11 DIAGNOSIS — E669 Obesity, unspecified: Secondary | ICD-10-CM | POA: Insufficient documentation

## 2012-06-11 LAB — CBC WITH DIFFERENTIAL/PLATELET
Eosinophils Absolute: 0.1 10*3/uL (ref 0.0–0.7)
Eosinophils Relative: 2 % (ref 0–5)
Lymphs Abs: 3.2 10*3/uL (ref 0.7–4.0)
MCH: 26 pg (ref 26.0–34.0)
MCV: 78.3 fL (ref 78.0–100.0)
Monocytes Relative: 7 % (ref 3–12)
Platelets: 221 10*3/uL (ref 150–400)
RBC: 4.47 MIL/uL (ref 3.87–5.11)

## 2012-06-11 LAB — BASIC METABOLIC PANEL
BUN: 15 mg/dL (ref 6–23)
Calcium: 9.3 mg/dL (ref 8.4–10.5)
GFR calc non Af Amer: 68 mL/min — ABNORMAL LOW (ref >90)
Glucose, Bld: 128 mg/dL — ABNORMAL HIGH (ref 70–99)
Sodium: 141 mEq/L (ref 135–145)

## 2012-06-11 LAB — URINALYSIS, ROUTINE W REFLEX MICROSCOPIC
Bilirubin Urine: NEGATIVE
Glucose, UA: NEGATIVE mg/dL
Ketones, ur: NEGATIVE mg/dL
Protein, ur: NEGATIVE mg/dL

## 2012-06-11 LAB — URINE MICROSCOPIC-ADD ON

## 2012-06-11 MED ORDER — DEXTROSE 5 % IV SOLN: 1.0000 g | Freq: Once | INTRAVENOUS | Status: DC

## 2012-06-11 MED ORDER — CEPHALEXIN 500 MG PO CAPS: 500.0000 mg | ORAL_CAPSULE | Freq: Four times a day (QID) | ORAL | Status: DC

## 2012-06-11 MED ORDER — DEXTROSE 5 % IV SOLN
1.0000 g | Freq: Once | INTRAVENOUS | Status: AC
  Administered 2012-06-11: 1 g via INTRAVENOUS
  Filled 2012-06-11: qty 10

## 2012-06-11 NOTE — ED Notes (Addendum)
Pt has multiple complaints, states she laid down and her Heart began racing, left side of head started hurting, she felt bad, clammy, and hot. Symptoms have resolved. Now states left leg hurts

## 2012-06-11 NOTE — ED Provider Notes (Signed)
Medical screening examination/treatment/procedure(s) were performed by non-physician practitioner and as supervising physician I was immediately available for consultation/collaboration.  Clarene Curran, MD 06/11/12 0709 

## 2012-06-11 NOTE — ED Provider Notes (Signed)
History     CSN: [id number]  Arrival date & time 06/11/12  0041   First MD Initiated Contact with Patient 06/11/12 0254      Chief Complaint  Patient presents with  . Irregular Heart Beat    HPI  History provided by the patient and EMS. Patient is a 56 year old female history of hypertension, obesity, GERD who presents with complaints of heart palpitations. Patient states that she woke up feeling heart racing. She tried to relax at rest but states heart rate continued to be very fast and felt pounding in her chest. Patient called EMS who reported patient had blood pressure of 200/110. Symptoms were associated with slight lightheadedness and headache. Symptoms have gradually improved with time. Patient states that she has history of similar episodes of heart palpitations in the past. She states this was especially true with recent course of prednisone medications over the last several weeks. Patient took last dose one week ago.      Past Medical History  Diagnosis Date  . Seasonal allergies   . Asthma     History of  . Hypertension   . Iron deficiency 12-07-2011  . Myocardial infarction     unsure when  . Anemia   . GERD (gastroesophageal reflux disease)   . Heart murmur   . Osteoporosis   . PONV (postoperative nausea and vomiting)   . Dysrhythmia     occ palpitations   . Sleep apnea     no CPAP machine , borderine   . Headache     occasional   . Osteoarthritis     osteoarthritis   . Morbid obesity   . Acute urinary retention s/p Foley 01/07/2012  . Hemorrhoids, internal, with bleeding & prolapse 12/13/2011    Past Surgical History  Procedure Date  . Knee surgery     Both knees  . Total knee arthroplasty 04/29/2011    Left knee  . Multiple tooth extractions   . Wisdom tooth extraction   . Colonoscopy 2009  . Knee arthroscopy 1991  . Transanal hemorrhoidal dearterliaization 01/06/12    with external hemorrhoid removal    Family History  Problem Relation Age of  Onset  . Colon cancer Neg Hx   . Diabetes Mother   . Hypertension Mother   . Heart disease Sister     Congestive heart failure  . Diabetes type II Sister   . Kidney disease Brother     History  Substance Use Topics  . Smoking status: Never Smoker   . Smokeless tobacco: Never Used  . Alcohol Use: No    OB History    Grav Para Term Preterm Abortions TAB SAB Ect Mult Living                  Review of Systems  Constitutional: Negative for fever and chills.  Respiratory: Negative for shortness of breath.   Cardiovascular: Positive for palpitations. Negative for chest pain and leg swelling.  Gastrointestinal: Negative for nausea and vomiting.  Genitourinary: Positive for dysuria. Negative for frequency, hematuria and flank pain.  Neurological: Positive for light-headedness and headaches.    Allergies  Codeine; Hydrocodone; Latex; Oxycodone-acetaminophen; Peanut-containing drug products; Penicillins; and Prednisone  Home Medications   Current Outpatient Rx  Name Route Sig Dispense Refill  . ACETAMINOPHEN 500 MG PO TABS Oral Take 1,000 mg by mouth every 6 (six) hours as needed. Pain     . ALBUTEROL SULFATE HFA 108 (90 BASE) MCG/ACT IN AERS Inhalation Inhale  2 puffs into the lungs every 6 (six) hours as needed. Wheezing     . ASPIRIN BUFFERED 325 MG PO TABS Oral Take 325 mg by mouth daily.    . CYCLOSPORINE 0.05 % OP EMUL Both Eyes Place 1 drop into both eyes 2 (two) times daily.    Marland Kitchen DICLOFENAC SODIUM 1 % TD GEL Topical Apply 1 application topically 2 (two) times daily as needed. TOPICAL TO KNEE, SHOULDER, WRIST, AND ANKLE PAIN    . DOCUSATE SODIUM 100 MG PO CAPS Oral Take 100 mg by mouth 2 (two) times daily.    Marland Kitchen ESOMEPRAZOLE MAGNESIUM 40 MG PO CPDR Oral Take 40 mg by mouth daily before breakfast.    . FERROUS SULFATE CR 160 (50 FE) MG PO TBCR Oral Take 160 mg by mouth daily.    Marland Kitchen FLUTICASONE FUROATE 27.5 MCG/SPRAY NA SUSP Nasal Place 2 sprays into the nose daily.    Marland Kitchen  HYDROCHLOROTHIAZIDE 12.5 MG PO CAPS Oral Take 12.5 mg by mouth daily.     . ADULT MULTIVITAMIN W/MINERALS CH Oral Take 0.5 tablets by mouth every other day.    . NEBIVOLOL HCL 5 MG PO TABS Oral Take 5 mg by mouth at bedtime.     Frazier Butt FREE OP Both Eyes Place 1 drop into both eyes daily.    Marland Kitchen PREGABALIN 50 MG PO CAPS Oral Take 50 mg by mouth 2 (two) times daily.     Marland Kitchen VALSARTAN 160 MG PO TABS Oral Take 160 mg by mouth 2 (two) times daily.     Marland Kitchen VITAMIN B-12 500 MCG PO TABS Oral Take 500 mcg by mouth daily.      BP 147/88  Pulse 80  Temp 98.6 F (37 C) (Oral)  Resp 20  SpO2 100%  Physical Exam  Nursing note and vitals reviewed. Constitutional: She is oriented to person, place, and time. She appears well-developed and well-nourished. No distress.  HENT:  Head: Normocephalic.  Eyes: Conjunctivae and EOM are normal. Pupils are equal, round, and reactive to light.  Cardiovascular: Normal rate and regular rhythm.   No murmur heard. Pulmonary/Chest: Effort normal and breath sounds normal. No respiratory distress. She has no wheezes. She has no rales.  Abdominal: Soft. There is no tenderness.  Neurological: She is alert and oriented to person, place, and time. She has normal strength. No sensory deficit.  Skin: Skin is warm and dry. No rash noted.  Psychiatric: She has a normal mood and affect. Her behavior is normal.    ED Course  Procedures   Results for orders placed during the hospital encounter of 06/11/12  CBC WITH DIFFERENTIAL      Component Value Range   WBC 7.6  4.0 - 10.5 K/uL   RBC 4.47  3.87 - 5.11 MIL/uL   Hemoglobin 11.6 (*) 12.0 - 15.0 g/dL   HCT 78.2 (*) 95.6 - 21.3 %   MCV 78.3  78.0 - 100.0 fL   MCH 26.0  26.0 - 34.0 pg   MCHC 33.1  30.0 - 36.0 g/dL   RDW 08.6 (*) 57.8 - 46.9 %   Platelets 221  150 - 400 K/uL   Neutrophils Relative 49  43 - 77 %   Neutro Abs 3.8  1.7 - 7.7 K/uL   Lymphocytes Relative 42  12 - 46 %   Lymphs Abs 3.2  0.7 - 4.0 K/uL    Monocytes Relative 7  3 - 12 %   Monocytes Absolute 0.5  0.1 - 1.0 K/uL   Eosinophils Relative 2  0 - 5 %   Eosinophils Absolute 0.1  0.0 - 0.7 K/uL   Basophils Relative 0  0 - 1 %   Basophils Absolute 0.0  0.0 - 0.1 K/uL  BASIC METABOLIC PANEL      Component Value Range   Sodium 141  135 - 145 mEq/L   Potassium 3.9  3.5 - 5.1 mEq/L   Chloride 100  96 - 112 mEq/L   CO2 31  19 - 32 mEq/L   Glucose, Bld 128 (*) 70 - 99 mg/dL   BUN 15  6 - 23 mg/dL   Creatinine, Ser 4.09  0.50 - 1.10 mg/dL   Calcium 9.3  8.4 - 81.1 mg/dL   GFR calc non Af Amer 68 (*) >90 mL/min   GFR calc Af Amer 79 (*) >90 mL/min  TROPONIN I      Component Value Range   Troponin I <0.30  <0.30 ng/mL  URINALYSIS, ROUTINE W REFLEX MICROSCOPIC      Component Value Range   Color, Urine YELLOW  YELLOW   APPearance TURBID (*) CLEAR   Specific Gravity, Urine 1.021  1.005 - 1.030   pH 5.5  5.0 - 8.0   Glucose, UA NEGATIVE  NEGATIVE mg/dL   Hgb urine dipstick SMALL (*) NEGATIVE   Bilirubin Urine NEGATIVE  NEGATIVE   Ketones, ur NEGATIVE  NEGATIVE mg/dL   Protein, ur NEGATIVE  NEGATIVE mg/dL   Urobilinogen, UA 0.2  0.0 - 1.0 mg/dL   Nitrite NEGATIVE  NEGATIVE   Leukocytes, UA LARGE (*) NEGATIVE  URINE MICROSCOPIC-ADD ON      Component Value Range   Squamous Epithelial / LPF MANY (*) RARE   WBC, UA 11-20  <3 WBC/hpf   RBC / HPF 0-2  <3 RBC/hpf   Bacteria, UA MANY (*) RARE   Urine-Other LESS THAN 10 mL OF URINE SUBMITTED        Dg Chest 2 View  06/11/2012  *RADIOLOGY REPORT*  Clinical Data: Irregular heart beat.  CHEST - 2 VIEW  Comparison: 01/03/2012  Findings: The heart size and pulmonary vascularity are normal. The lungs appear clear and expanded without focal air space disease or consolidation. No blunting of the costophrenic angles.  No pneumothorax.  Degenerative changes in the thoracic spine.  No significant change since previous study.  IMPRESSION: No evidence of active pulmonary disease.  Original Report  Authenticated By: Marlon Pel, M.D.     1. UTI (lower urinary tract infection)       MDM  3:00AM patient seen and evaluated. Patient with normal heart rate and regular rhythm.  Patient also has complaints of foul urine odor and cloudy urine for past few days.   Date: 06/11/2012  Rate: 76  Rhythm: normal sinus rhythm  QRS Axis: normal  Intervals: normal  ST/T Wave abnormalities: nonspecific ST/T changes  Conduction Disutrbances:none  Narrative Interpretation:   Old EKG Reviewed: unchanged from 11/22/2010      Angus Seller, PA 06/11/12 [phone]

## 2012-06-11 NOTE — ED Notes (Signed)
Pt states understanding of discharge instructions 

## 2012-06-11 NOTE — ED Notes (Signed)
Per EMS: were called out for palpations. Pt placed on monitor, no complaints of CP just feels heart beating fast. Pt has hx of HTN well controlled on BP medications. Today pt BP increased to 200/110, then 176 palpated last BP EMS got. Pt complaining of dizziness with slight HA.

## 2012-06-12 LAB — POCT I-STAT TROPONIN I

## 2012-06-17 ENCOUNTER — Emergency Department (INDEPENDENT_AMBULATORY_CARE_PROVIDER_SITE_OTHER)
Admission: EM | Admit: 2012-06-17 | Discharge: 2012-06-17 | Disposition: A | Discharge status: Home / Self Care | Payer: Medicare Other | Source: Home / Self Care | Attending: Emergency Medicine | Admitting: Emergency Medicine

## 2012-06-17 ENCOUNTER — Encounter (HOSPITAL_COMMUNITY): Payer: Self-pay | Admitting: *Deleted

## 2012-06-17 DIAGNOSIS — Z888 Allergy status to other drugs, medicaments and biological substances status: Secondary | ICD-10-CM

## 2012-06-17 DIAGNOSIS — T7840XA Allergy, unspecified, initial encounter: Secondary | ICD-10-CM

## 2012-06-17 DIAGNOSIS — K141 Geographic tongue: Secondary | ICD-10-CM

## 2012-06-17 LAB — POCT URINALYSIS DIP (DEVICE)
Glucose, UA: NEGATIVE mg/dL
Nitrite: NEGATIVE
Urobilinogen, UA: 0.2 mg/dL (ref 0.0–1.0)

## 2012-06-17 MED ORDER — NYSTATIN 100000 UNIT/ML MT SUSP: 500000.0000 [IU] | Freq: Four times a day (QID) | OROMUCOSAL | Status: AC

## 2012-06-17 NOTE — ED Provider Notes (Signed)
History     CSN: [id number]  Arrival date & time 06/17/12  1151   First MD Initiated Contact with Patient 06/17/12 1348      Chief Complaint  Patient presents with  . Allergic Reaction    (Consider location/radiation/quality/duration/timing/severity/associated sxs/prior treatment) HPI Comments: Pt recently on 12 days of prednisone that caused palpitations for which she was seen in the ER.  Incidentally found to have a UTI, placed on keflex.  3.5 days into keflex course, pt developed swelling in tongue, lips, and gums.  Stopped taking keflex 3 days ago, lips no longer swollen, feels tongue and gum swelling improving but not resolved.  Denies new medicines except for prednisone and keflex.  Also reports feeling tongue is sore with a burning pain  Patient is a 56 y.o. female presenting with allergic reaction. The history is provided by the patient.  Allergic Reaction The primary symptoms are  angioedema. The primary symptoms do not include shortness of breath or rash. Episode onset: 4 days ago. The problem has been gradually improving.  It is located on the tongue and lips. The angioedema is not associated with shortness of breath.  The onset of the reaction was associated with a new medication.    Past Medical History  Diagnosis Date  . Seasonal allergies   . Asthma     History of  . Hypertension   . Iron deficiency 12-07-2011  . Myocardial infarction     unsure when  . Anemia   . GERD (gastroesophageal reflux disease)   . Heart murmur   . Osteoporosis   . PONV (postoperative nausea and vomiting)   . Dysrhythmia     occ palpitations   . Sleep apnea     no CPAP machine , borderine   . Headache     occasional   . Osteoarthritis     osteoarthritis   . Morbid obesity   . Acute urinary retention s/p Foley 01/07/2012  . Hemorrhoids, internal, with bleeding & prolapse 12/13/2011    Past Surgical History  Procedure Date  . Knee surgery     Both knees  . Total knee  arthroplasty 04/29/2011    Left knee  . Multiple tooth extractions   . Wisdom tooth extraction   . Colonoscopy 2009  . Knee arthroscopy 1991  . Transanal hemorrhoidal dearterliaization 01/06/12    with external hemorrhoid removal    Family History  Problem Relation Age of Onset  . Colon cancer Neg Hx   . Diabetes Mother   . Hypertension Mother   . Heart disease Sister     Congestive heart failure  . Diabetes type II Sister   . Kidney disease Brother     History  Substance Use Topics  . Smoking status: Never Smoker   . Smokeless tobacco: Never Used  . Alcohol Use: No    OB History    Grav Para Term Preterm Abortions TAB SAB Ect Mult Living                  Review of Systems  Constitutional: Negative for fever and chills.  HENT: Negative for trouble swallowing.        Angioedema  Respiratory: Negative for shortness of breath.   Genitourinary: Negative for dysuria.  Skin: Negative for rash.    Allergies  Cephalosporins; Codeine; Hydrocodone; Latex; Oxycodone-acetaminophen; Peanut-containing drug products; Penicillins; and Prednisone  Home Medications   Current Outpatient Rx  Name Route Sig Dispense Refill  . ACETAMINOPHEN 500 MG  PO TABS Oral Take 1,000 mg by mouth every 6 (six) hours as needed. Pain     . ALBUTEROL SULFATE HFA 108 (90 BASE) MCG/ACT IN AERS Inhalation Inhale 2 puffs into the lungs every 6 (six) hours as needed. Wheezing     . ASPIRIN BUFFERED 325 MG PO TABS Oral Take 325 mg by mouth daily.    . CYCLOSPORINE 0.05 % OP EMUL Both Eyes Place 1 drop into both eyes 2 (two) times daily.    Marland Kitchen DICLOFENAC SODIUM 1 % TD GEL Topical Apply 1 application topically 2 (two) times daily as needed. TOPICAL TO KNEE, SHOULDER, WRIST, AND ANKLE PAIN    . DOCUSATE SODIUM 100 MG PO CAPS Oral Take 100 mg by mouth 2 (two) times daily.    Marland Kitchen FERROUS SULFATE CR 160 (50 FE) MG PO TBCR Oral Take 160 mg by mouth daily.    Marland Kitchen FLUTICASONE FUROATE 27.5 MCG/SPRAY NA SUSP Nasal Place  2 sprays into the nose daily.    Marland Kitchen HYDROCHLOROTHIAZIDE 12.5 MG PO CAPS Oral Take 12.5 mg by mouth daily.     . ADULT MULTIVITAMIN W/MINERALS CH Oral Take 0.5 tablets by mouth every other day.    . NEBIVOLOL HCL 5 MG PO TABS Oral Take 5 mg by mouth at bedtime.     Frazier Butt FREE OP Both Eyes Place 1 drop into both eyes daily.    Marland Kitchen PREGABALIN 50 MG PO CAPS Oral Take 50 mg by mouth 2 (two) times daily.     Marland Kitchen RANITIDINE HCL PO Oral Take by mouth.    . VALSARTAN 160 MG PO TABS Oral Take 160 mg by mouth 2 (two) times daily.     Marland Kitchen VITAMIN B-12 500 MCG PO TABS Oral Take 500 mcg by mouth daily.    Marland Kitchen ESOMEPRAZOLE MAGNESIUM 40 MG PO CPDR Oral Take 40 mg by mouth daily before breakfast.    . NYSTATIN 100000 UNIT/ML MT SUSP Oral Take 5 mLs (500,000 Units total) by mouth 4 (four) times daily. 120 mL 0    BP 108/80  Pulse 80  Temp 98.1 F (36.7 C) (Oral)  Resp 18  SpO2 100%  Physical Exam  Constitutional: She appears well-developed and well-nourished. No distress.       Morbid obesity  HENT:  Mouth/Throat: Oropharynx is clear and moist. No uvula swelling.       No edema to gums.  Red areas on dorsal tongue with scalloped borders c/w geographic tongue  Pulmonary/Chest: Effort normal.    ED Course  Procedures (including critical care time)  Labs Reviewed  POCT URINALYSIS DIP (DEVICE) - Abnormal; Notable for the following:    Hgb urine dipstick TRACE (*)     All other components within normal limits   No results found.   1. Geographic tongue   2. Allergic reaction caused by a drug       MDM  Urine recheck here does not have signs of infection, pt's urinary sx resolved.  Hopefully 3.5 days of keflex was sufficient tx.  Pt to f/u with pcp if uti sx resume.  Given that pt was on prednisone prior to keflex reaction, it's possible she has an early thrush. Will tx, pt to discuss tongue sx with pcp if they do not improve with tx.         Cathlyn Parsons, NP 06/17/12 1425

## 2012-06-17 NOTE — ED Notes (Signed)
Reports being put on cephalexin for UTI that was found during ED visit 7/21.  4 days ago started w/ gum pain and redness and tongue swelling.  Stopped taking abx 3 days ago due to sxs.  States tongue swelling has improved some since stopping abx.

## 2012-06-18 NOTE — ED Provider Notes (Signed)
Medical screening examination/treatment/procedure(s) were performed by non-physician practitioner and as supervising physician I was immediately available for consultation/collaboration.  I saw this patient along with nurse's practitioner. She has geographic tongue, and I think her oral symptoms are due to Candida overgrowth.  Leslee Home, M.D.   Reuben Likes, MD 06/18/12 1058

## 2012-09-08 ENCOUNTER — Ambulatory Visit: Payer: Medicare Other | Admitting: *Deleted

## 2012-09-11 ENCOUNTER — Emergency Department (HOSPITAL_COMMUNITY)
Admission: EM | Admit: 2012-09-11 | Discharge: 2012-09-11 | Disposition: A | Discharge status: Home / Self Care | Payer: Medicare Other | Attending: Emergency Medicine | Admitting: Emergency Medicine

## 2012-09-11 ENCOUNTER — Encounter (HOSPITAL_COMMUNITY): Payer: Self-pay | Admitting: Emergency Medicine

## 2012-09-11 ENCOUNTER — Emergency Department (HOSPITAL_COMMUNITY): Payer: Medicare Other

## 2012-09-11 DIAGNOSIS — K219 Gastro-esophageal reflux disease without esophagitis: Secondary | ICD-10-CM | POA: Insufficient documentation

## 2012-09-11 DIAGNOSIS — M810 Age-related osteoporosis without current pathological fracture: Secondary | ICD-10-CM | POA: Insufficient documentation

## 2012-09-11 DIAGNOSIS — I252 Old myocardial infarction: Secondary | ICD-10-CM | POA: Insufficient documentation

## 2012-09-11 DIAGNOSIS — D509 Iron deficiency anemia, unspecified: Secondary | ICD-10-CM | POA: Insufficient documentation

## 2012-09-11 DIAGNOSIS — Z79899 Other long term (current) drug therapy: Secondary | ICD-10-CM | POA: Insufficient documentation

## 2012-09-11 DIAGNOSIS — I10 Essential (primary) hypertension: Secondary | ICD-10-CM | POA: Insufficient documentation

## 2012-09-11 DIAGNOSIS — M76899 Other specified enthesopathies of unspecified lower limb, excluding foot: Secondary | ICD-10-CM | POA: Insufficient documentation

## 2012-09-11 DIAGNOSIS — J45909 Unspecified asthma, uncomplicated: Secondary | ICD-10-CM | POA: Insufficient documentation

## 2012-09-11 DIAGNOSIS — M7070 Other bursitis of hip, unspecified hip: Secondary | ICD-10-CM

## 2012-09-11 DIAGNOSIS — Z8679 Personal history of other diseases of the circulatory system: Secondary | ICD-10-CM | POA: Insufficient documentation

## 2012-09-11 DIAGNOSIS — Z7982 Long term (current) use of aspirin: Secondary | ICD-10-CM | POA: Insufficient documentation

## 2012-09-11 DIAGNOSIS — Z8669 Personal history of other diseases of the nervous system and sense organs: Secondary | ICD-10-CM | POA: Insufficient documentation

## 2012-09-11 MED ORDER — ACETAMINOPHEN 500 MG PO TABS: 500.0000 mg | ORAL_TABLET | Freq: Four times a day (QID) | ORAL | Status: DC | PRN

## 2012-09-11 MED ORDER — OXYCODONE-ACETAMINOPHEN 5-325 MG PO TABS
1.0000 | ORAL_TABLET | Freq: Once | ORAL | Status: AC
  Administered 2012-09-11: 1 via ORAL
  Filled 2012-09-11: qty 1

## 2012-09-11 MED ORDER — DOCUSATE SODIUM 100 MG PO CAPS
100.0000 mg | ORAL_CAPSULE | Freq: Two times a day (BID) | ORAL | Status: DC
Start: 2012-09-11 — End: 2013-02-01

## 2012-09-11 MED ORDER — METHOCARBAMOL 500 MG PO TABS: 500.0000 mg | ORAL_TABLET | Freq: Three times a day (TID) | ORAL | Status: DC

## 2012-09-11 NOTE — ED Provider Notes (Signed)
History     CSN: [id number]  Arrival date & time 09/11/12  1536   First MD Initiated Contact with Patient 09/11/12 1648      Chief Complaint  Patient presents with  . Hip Pain    (Consider location/radiation/quality/duration/timing/severity/associated sxs/prior treatment) HPI Comments: Patient is a 56 year old female who presents with a 2 day history of right hip pain that started suddenly and awoke her from sleep. The pain is located in her right hip and radiates down the front of her right thigh to her knee. The pain is described as burning and severe. The pain started gradually and progressively worsened since the onset. Weight bearing activity and right hip movement are aggravating factors. No alleviating factors. The patient has tried vinegar and Amovo for symptoms without relief. Associated symptoms include nothing. Patient denies fever, headache, NVD, chest pain, SOB, dysuria, back pain, numbness/tingling.    Patient is a 56 y.o. female presenting with hip pain.  Hip Pain Associated symptoms include arthralgias.    Past Medical History  Diagnosis Date  . Seasonal allergies   . Asthma     History of  . Hypertension   . Iron deficiency 12-07-2011  . Myocardial infarction     unsure when  . Anemia   . GERD (gastroesophageal reflux disease)   . Heart murmur   . Osteoporosis   . PONV (postoperative nausea and vomiting)   . Dysrhythmia     occ palpitations   . Sleep apnea     no CPAP machine , borderine   . Headache     occasional   . Osteoarthritis     osteoarthritis   . Morbid obesity   . Acute urinary retention s/p Foley 01/07/2012  . Hemorrhoids, internal, with bleeding & prolapse 12/13/2011    Past Surgical History  Procedure Date  . Knee surgery     Both knees  . Total knee arthroplasty 04/29/2011    Left knee  . Multiple tooth extractions   . Wisdom tooth extraction   . Colonoscopy 2009  . Knee arthroscopy 1991  . Transanal hemorrhoidal  dearterliaization 01/06/12    with external hemorrhoid removal    Family History  Problem Relation Age of Onset  . Colon cancer Neg Hx   . Diabetes Mother   . Hypertension Mother   . Heart disease Sister     Congestive heart failure  . Diabetes type II Sister   . Kidney disease Brother     History  Substance Use Topics  . Smoking status: Never Smoker   . Smokeless tobacco: Never Used  . Alcohol Use: No    OB History    Grav Para Term Preterm Abortions TAB SAB Ect Mult Living                  Review of Systems  Musculoskeletal: Positive for arthralgias and gait problem.  All other systems reviewed and are negative.    Allergies  Cephalosporins; Codeine; Latex; Peanut-containing drug products; Penicillins; and Prednisone  Home Medications   Current Outpatient Rx  Name Route Sig Dispense Refill  . ACETAMINOPHEN 500 MG PO TABS Oral Take 1,000 mg by mouth every 6 (six) hours as needed. Pain     . ALBUTEROL SULFATE HFA 108 (90 BASE) MCG/ACT IN AERS Inhalation Inhale 2 puffs into the lungs every 6 (six) hours as needed. Wheezing     . ASPIRIN BUFFERED 325 MG PO TABS Oral Take 325 mg by mouth daily.    Marland Kitchen  CYCLOSPORINE 0.05 % OP EMUL Both Eyes Place 1 drop into both eyes 2 (two) times daily.    Marland Kitchen DICLOFENAC SODIUM 1 % TD GEL Topical Apply 1 application topically 2 (two) times daily as needed. TOPICAL TO KNEE, SHOULDER, WRIST, AND ANKLE PAIN    . DOCUSATE SODIUM 100 MG PO CAPS Oral Take 100 mg by mouth 2 (two) times daily.    Marland Kitchen ESOMEPRAZOLE MAGNESIUM 40 MG PO CPDR Oral Take 40 mg by mouth daily before breakfast.    . FERROUS SULFATE CR 160 (50 FE) MG PO TBCR Oral Take 160 mg by mouth daily.    Marland Kitchen FLUTICASONE FUROATE 27.5 MCG/SPRAY NA SUSP Nasal Place 2 sprays into the nose daily.    Marland Kitchen HYDROCHLOROTHIAZIDE 12.5 MG PO CAPS Oral Take 12.5 mg by mouth daily.     . ADULT MULTIVITAMIN W/MINERALS CH Oral Take 0.5 tablets by mouth every other day.    . NEBIVOLOL HCL 5 MG PO TABS Oral  Take 5 mg by mouth at bedtime.     Frazier Butt FREE OP Both Eyes Place 1 drop into both eyes daily.    Marland Kitchen PREGABALIN 50 MG PO CAPS Oral Take 50 mg by mouth 2 (two) times daily.     Marland Kitchen RANITIDINE HCL PO Oral Take by mouth.    . VALSARTAN 160 MG PO TABS Oral Take 160 mg by mouth 2 (two) times daily.     Marland Kitchen VITAMIN B-12 500 MCG PO TABS Oral Take 500 mcg by mouth daily.      BP 172/94  Pulse 76  Temp 98.1 F (36.7 C) (Oral)  Resp 16  SpO2 98%  Physical Exam  Nursing note and vitals reviewed. Constitutional: She is oriented to person, place, and time. She appears well-developed and well-nourished. No distress.       Patient is obese.   HENT:  Head: Normocephalic and atraumatic.  Eyes: Conjunctivae normal and EOM are normal. Pupils are equal, round, and reactive to light. No scleral icterus.  Neck: Normal range of motion. Neck supple.  Cardiovascular: Normal rate and regular rhythm.  Exam reveals no gallop and no friction rub.   No murmur heard. Pulmonary/Chest: Effort normal and breath sounds normal. She has no wheezes. She has no rales. She exhibits no tenderness.  Abdominal: Soft. She exhibits no distension.  Musculoskeletal: Normal range of motion.  Neurological: She is alert and oriented to person, place, and time. Coordination normal.       Strength and sensation equal and intact bilaterally. Patient's gait affected by right hip pain. Speech is goal-oriented. Moves limbs without ataxia.   Skin: Skin is warm and dry. She is not diaphoretic.  Psychiatric: She has a normal mood and affect. Her behavior is normal.    ED Course  Procedures (including critical care time)  Labs Reviewed - No data to display Dg Hip Complete Right  09/11/2012  *RADIOLOGY REPORT*  Clinical Data: Right hip pain.  Fall.  RIGHT HIP - COMPLETE 2+ VIEW  Comparison: None.  Findings: The right hip is located.  No acute bone or soft tissue abnormality is present.  IMPRESSION: Negative right hip.   Original Report  Authenticated By: Jamesetta Orleans. MATTERN, M.D.      1. Bursitis of hip       MDM  5:48 PM Patient's hip xray negative for fracture. Patient will follow up with Dr. Constance Goltz at [facility] in 4 days. Patient has a cane and walker at home to use when  walking. Patient requested Amovo for pain. Patient will be sent home with pain medication and instructions to rest, ice, and bear weight as tolerated. No signs of neurovascular compromise. No further evaluation needed at this time.         Emilia Beck, PA-C 09/11/12 1800

## 2012-09-11 NOTE — ED Notes (Addendum)
EMS reports patient began having right hip and right knee pain this morning after waking up.  Pain subsided and then came back. No injury reported.  Patient has h/o arthritis.

## 2012-09-14 NOTE — ED Provider Notes (Signed)
Medical screening examination/treatment/procedure(s) were performed by non-physician practitioner and as supervising physician I was immediately available for consultation/collaboration.  Burnis Kaser T Martyn Timme, MD 09/14/12 2231 

## 2012-09-26 ENCOUNTER — Ambulatory Visit: Payer: Medicare Other | Admitting: *Deleted

## 2012-09-29 ENCOUNTER — Ambulatory Visit: Payer: Medicare Other | Admitting: *Deleted

## 2012-10-05 ENCOUNTER — Ambulatory Visit: Payer: Self-pay | Admitting: *Deleted

## 2012-10-12 ENCOUNTER — Ambulatory Visit: Payer: Self-pay | Admitting: *Deleted

## 2012-11-28 ENCOUNTER — Ambulatory Visit (INDEPENDENT_AMBULATORY_CARE_PROVIDER_SITE_OTHER): Payer: Medicare Other | Admitting: Internal Medicine

## 2012-11-28 ENCOUNTER — Other Ambulatory Visit: Payer: Self-pay | Admitting: Internal Medicine

## 2012-11-28 ENCOUNTER — Encounter: Payer: Self-pay | Admitting: Internal Medicine

## 2012-11-28 VITALS — BP 141/78 | HR 74 | Temp 97.1°F | Ht 68.0 in | Wt 314.3 lb

## 2012-11-28 DIAGNOSIS — E538 Deficiency of other specified B group vitamins: Secondary | ICD-10-CM

## 2012-11-28 DIAGNOSIS — Z8639 Personal history of other endocrine, nutritional and metabolic disease: Secondary | ICD-10-CM

## 2012-11-28 DIAGNOSIS — Z1231 Encounter for screening mammogram for malignant neoplasm of breast: Secondary | ICD-10-CM

## 2012-11-28 DIAGNOSIS — Z23 Encounter for immunization: Secondary | ICD-10-CM

## 2012-11-28 DIAGNOSIS — I10 Essential (primary) hypertension: Secondary | ICD-10-CM

## 2012-11-28 DIAGNOSIS — E559 Vitamin D deficiency, unspecified: Secondary | ICD-10-CM

## 2012-11-28 DIAGNOSIS — G4730 Sleep apnea, unspecified: Secondary | ICD-10-CM

## 2012-11-28 DIAGNOSIS — Z299 Encounter for prophylactic measures, unspecified: Secondary | ICD-10-CM

## 2012-11-28 DIAGNOSIS — K219 Gastro-esophageal reflux disease without esophagitis: Secondary | ICD-10-CM

## 2012-11-28 LAB — CBC
MCH: 28.1 pg (ref 26.0–34.0)
Platelets: 247 10*3/uL (ref 150–400)
RBC: 4.98 MIL/uL (ref 3.87–5.11)
RDW: 15.7 % — ABNORMAL HIGH (ref 11.5–15.5)

## 2012-11-28 LAB — COMPLETE METABOLIC PANEL WITH GFR
AST: 19 U/L (ref 0–37)
Albumin: 3.8 g/dL (ref 3.5–5.2)
Alkaline Phosphatase: 50 U/L (ref 39–117)
BUN: 6 mg/dL (ref 6–23)
Potassium: 4 mEq/L (ref 3.5–5.3)
Sodium: 137 mEq/L (ref 135–145)
Total Bilirubin: 0.4 mg/dL (ref 0.3–1.2)
Total Protein: 7.8 g/dL (ref 6.0–8.3)

## 2012-11-28 LAB — LIPID PANEL: LDL Cholesterol: 125 mg/dL — ABNORMAL HIGH (ref 0–99)

## 2012-11-28 LAB — GLUCOSE, CAPILLARY: Glucose-Capillary: 96 mg/dL (ref 70–99)

## 2012-11-28 LAB — POCT GLYCOSYLATED HEMOGLOBIN (HGB A1C): Hemoglobin A1C: 4.5

## 2012-11-28 MED ORDER — ESOMEPRAZOLE MAGNESIUM 40 MG PO PACK: 40.0000 mg | PACK | Freq: Every day | ORAL | Status: DC

## 2012-11-28 NOTE — Progress Notes (Signed)
Subjective:    Patient ID: [name], female    DOB: 1956/09/20, 57 y.o.   MRN: [id number]  HPI  This is a 57 year old patient who comes from [facility], last visit to physician in Sep 2013, past medical history of GERD, asthma (seasonal), hypertension and headaches. The patient comes in today to establish a visit for continuity care, and also wants to get Nexium prescription. The problems we discussed today are as follows:  1. GERD: The patient experiences continuous heartburn. She was benefited from Nexium, however was changed to Ranitidine for reasons unknown to her. She is not relieved from Ranitidine. She has tried OTC Prevacid with partial benefit. She underwent EGD on 12/09/2011 (Dr. Melvia Heaps); found multiple polyps in fundus. She is trying lifestyle modifications like eating small meals, eating low fat and low carb meals and is benefiting from those. She has recently started practicing sleeping 2 hours after dinner to avoid heartburn. For some reason, she was taking Aspirin 325 mg twice a day and I asked her to scale it down to one a day. She has also reduced considerably on her pain killers.   2. Hypertension: She was supposed to be on Nebivulol 5 mg daily, and Diovan 160 mg twice a day, and HCTZ 12.5 mg daily. However, she has not been taking nebivulol ("stopped just like that", she says) and she ran out of her Diovan, so she got the combination pill of Diovan HCTZ 160/25 and has been taking that. She stopped taking her HCTZ 12.5 mg because she was taking the combination. She checks her blood pressure readings at home through an automated wrist cuff machine, and she says that she had been maintaining readings below 140/90. She was surprised that her BP in clinic was high.     3. Headaches: She has been getting headaches off an on at random intervals, especially when she does not get enough sleep. This has been happening since the past 3 years. They are unilateral, stabbing, present  for transient duration, often over the orbit. She did not report any autonomic symptoms to me. For the past one year, she is seeing [facility] for the same and they have diagnosed them as Cluster Headaches (The patient reports this term, I do not have documentation). I believe that she has been worked up quite a bit for migraine and other differentials of headache with MRI and CT scans of the head in the past which did not show a significant anomaly that the headache could be attributed to. She is on Lyrica, however is unsure if she is benefiting much.    4. Sleep Apnea: She sleeps on 6 pillows, however denies shortness of breath or PND. According to her there was a sleep study performed in 2011 and she was diagnosed of sleep apnea however, she was not recommended CPAP.    5. Seasonal asthma: Well controlled, uses albuterol 1-2 times a month. Also takes fluticasone spray.  6. Hyperlipidemia: She has been on a statin before, she does not remember the name of the medicine. However, it was discontinued because she developed severe joint pains after starting it.   7. Question of MI : She says she has been told that she has had an MI by her previous doctor on examining a report, however, she does not remember having a "heart attack". She does not have a cardiologist.   Past Medical History  has a past medical history of Seasonal allergies; Asthma; Hypertension; Iron deficiency (  12-07-2011); Anemia; GERD (gastroesophageal reflux disease); Heart murmur; Osteoporosis; PONV (postoperative nausea and vomiting); Dysrhythmia; Sleep apnea; Headache; Osteoarthritis; Morbid obesity; Acute urinary retention s/p Foley (01/07/2012); Hemorrhoids, internal, with bleeding & prolapse (12/13/2011); Myocardial infarction; and Chronic headaches.  Family History family history includes Diabetes in her mother; Diabetes type II in her sister; Heart disease in her sister; Hypertension in her mother; and Kidney disease in  her brother.  There is no history of Colon cancer.  Social History  reports that she has never smoked. She has never used smokeless tobacco. She reports that she does not drink alcohol or use illicit drugs. .soc    Review of Systems  Constitutional: Negative for fever, chills, diaphoresis, activity change, appetite change, fatigue and unexpected weight change.  HENT: Negative for hearing loss and ear pain.   Eyes: Negative for pain, discharge and visual disturbance.  Respiratory: Negative for apnea, chest tightness and shortness of breath.   Cardiovascular: Negative for chest pain and leg swelling.  Gastrointestinal: Negative.   Genitourinary: Negative.   Musculoskeletal: Positive for myalgias and arthralgias.  Skin: Negative.   Neurological: Positive for headaches. Negative for dizziness, seizures, syncope, speech difficulty, weakness and light-headedness.  Psychiatric/Behavioral: Negative.        Objective:   Physical Exam  Constitutional: She is oriented to person, place, and time. She appears well-developed.       Obese  HENT:  Head: Normocephalic and atraumatic.  Nose: Nose normal.  Mouth/Throat: Oropharynx is clear and moist.  Eyes: Conjunctivae normal and EOM are normal. Pupils are equal, round, and reactive to light. Right eye exhibits no discharge. Left eye exhibits no discharge. No scleral icterus.  Neck: Normal range of motion. Neck supple. No JVD present. No tracheal deviation present. No thyromegaly present.  Cardiovascular: Normal rate, regular rhythm and intact distal pulses.   No murmur heard. Pulmonary/Chest: Effort normal and breath sounds normal. No stridor. No respiratory distress. She has no wheezes. She has no rales. She exhibits no tenderness.  Abdominal: Soft. Bowel sounds are normal. She exhibits no distension. There is no tenderness. There is no guarding.  Musculoskeletal: Normal range of motion. She exhibits no edema and no tenderness.  Lymphadenopathy:     She has no cervical adenopathy.  Neurological: She is alert and oriented to person, place, and time. She has normal reflexes. No cranial nerve deficit.  Skin: Skin is warm.  Psychiatric: She has a normal mood and affect. Her behavior is normal. Judgment and thought content normal.      Assessment & Plan:    1. Hypertension: [name] has juggled around a little bit with her anti-hypertensive medications, however she reports good blood pressure in her home readings. Today her clinic reading was a little high, but on repeating the BP it was lower.   BP Readings from Last 3 Encounters:  11/28/12 141/78  09/11/12 171/76  06/17/12 108/80    I would at present ask her to continue Diovan - HCTZ 160/25mg  daily as she is already taking and resume Nebivulol 5 mg daily. I have encouraged her to make a log of her blood pressure and she will bring it to her next visit.   2. GERD: I will resume Nexium. A recent EGD in 2013 January has ruled out any red flags that she might have. I am not sure why Nexium was stopped, however, it does have a side effect of headaches in 2 - 8% of population (per [employer]). I have informed the patient about  this. At present, I have prescribed her 30 pills, and she will inform me about the benefit in her next visit.   3. Hyperlipidemia: Her last LDL was done on 12/04/2010 and was 123.  Lab Results  Component Value Date   CHOL 190 12/04/2010   HDL 51 12/04/2010   LDLCALC 123* 12/04/2010   TRIG 80 12/04/2010   CHOLHDL 3.7 Ratio 12/04/2010   Before asking the patient to try any statin again, I would like to recheck labs today.  4. Sleep Apnea: I will give the patient another referral to sleep study given that she has to sleep on 6 pillows to feel comfortable. Perhaps she qualifies for CPAP and she benefits from it in her blood pressure, and headaches.   5. Headaches: I will request records from [facility], and will not make any changes in the already on going  treatment at this time.   6. Question of vitamin B12 Deficiency: The patient has been told that she was B12 deficient and was taking Vitamin B12 500 mcg, however I have no evidence of this. Since she asks me if she can continue taking it or if she should stop, I have redrawn vitamin B12 levels with RBC folate.    7. Preventive:   The patient has had Colonoscopy in 2013 January with polyps removed, found to be tubular adenoma. Follow up is due in 5 years.    A Mammography referral has been done by me today.   We will redraw labs for blood work today.   We will check A1c given her slightly high blood sugars in the past and hypertension.  She has received a flu shot.   PAP smear is current.    NEXT VISIT in 3 weeks to 1 month. - Have to review GERD benefit - Have to go through BP log and review medication.  - Have to do EKG in view of questionable MI - Have to review pain control - Have to go over labs.

## 2012-11-28 NOTE — Patient Instructions (Addendum)
Thank you for your visit to [facility] Today. My name is Dr. Aletta Edouard and you met me for continuing your care as your PCP after discontinuation from [facility].  We are going to make the following changes to your medications:  1. Please keep taking Diovan-HCTZ 160/25 mg ONCE A DAY as you have started.  2. Please resume taking BYSTOLIC 5 mg daily. 3. Please lets schedule an appointment after 3 weeks to review your blood pressure on the changed regimen. Please bring your blood pressure log. 4. I have referred you to [facility] appointment at [facility]. Please await our phone call to schedule the appointment.  5. I have restarted NEXIUM 40 mg ONCE DAILY for your heartburn. Lets meet in 3 weeks and talk about how you benefited from it. 6. You have stopped taking your B12 tablets. Lets check your B12 levels again and see if you need them. 7. I congratulate you on your healthy lifestyle choices. Keep it up.   Thanks Dr. Aletta Edouard

## 2012-11-29 LAB — FOLATE RBC: RBC Folate: 1294 ng/mL (ref >=366)

## 2012-11-29 NOTE — Progress Notes (Signed)
Quick Note:  Called the patient and discussed the results with her. She will start 400 IU of Vitamin D OTC daily for her low vitamin D levels. ______

## 2012-12-06 NOTE — Addendum Note (Signed)
Addended by: Youlanda Roys A on: 12/06/2012 11:34 AM   Modules accepted: Orders

## 2012-12-26 ENCOUNTER — Ambulatory Visit (HOSPITAL_COMMUNITY)
Admission: RE | Admit: 2012-12-26 | Discharge: 2012-12-26 | Disposition: A | Discharge status: Home / Self Care | Payer: Medicare Other | Source: Ambulatory Visit | Attending: Internal Medicine | Admitting: Internal Medicine

## 2012-12-26 DIAGNOSIS — Z1231 Encounter for screening mammogram for malignant neoplasm of breast: Secondary | ICD-10-CM

## 2012-12-26 DIAGNOSIS — Z299 Encounter for prophylactic measures, unspecified: Secondary | ICD-10-CM

## 2012-12-28 ENCOUNTER — Ambulatory Visit (HOSPITAL_BASED_OUTPATIENT_CLINIC_OR_DEPARTMENT_OTHER): Payer: Medicare Other

## 2013-01-02 ENCOUNTER — Ambulatory Visit: Payer: Medicare Other | Admitting: Internal Medicine

## 2013-02-01 ENCOUNTER — Emergency Department (HOSPITAL_COMMUNITY): Payer: Medicare Other

## 2013-02-01 ENCOUNTER — Emergency Department (HOSPITAL_COMMUNITY)
Admission: EM | Admit: 2013-02-01 | Discharge: 2013-02-02 | Disposition: A | Discharge status: Home / Self Care | Payer: Medicare Other | Attending: Emergency Medicine | Admitting: Emergency Medicine

## 2013-02-01 ENCOUNTER — Encounter (HOSPITAL_COMMUNITY): Payer: Self-pay | Admitting: Adult Health

## 2013-02-01 DIAGNOSIS — I252 Old myocardial infarction: Secondary | ICD-10-CM | POA: Insufficient documentation

## 2013-02-01 DIAGNOSIS — Z8739 Personal history of other diseases of the musculoskeletal system and connective tissue: Secondary | ICD-10-CM | POA: Insufficient documentation

## 2013-02-01 DIAGNOSIS — R51 Headache: Secondary | ICD-10-CM | POA: Insufficient documentation

## 2013-02-01 DIAGNOSIS — R011 Cardiac murmur, unspecified: Secondary | ICD-10-CM | POA: Insufficient documentation

## 2013-02-01 DIAGNOSIS — IMO0002 Reserved for concepts with insufficient information to code with codable children: Secondary | ICD-10-CM | POA: Insufficient documentation

## 2013-02-01 DIAGNOSIS — Z7982 Long term (current) use of aspirin: Secondary | ICD-10-CM | POA: Insufficient documentation

## 2013-02-01 DIAGNOSIS — G8929 Other chronic pain: Secondary | ICD-10-CM | POA: Insufficient documentation

## 2013-02-01 DIAGNOSIS — Z8719 Personal history of other diseases of the digestive system: Secondary | ICD-10-CM | POA: Insufficient documentation

## 2013-02-01 DIAGNOSIS — J45909 Unspecified asthma, uncomplicated: Secondary | ICD-10-CM | POA: Insufficient documentation

## 2013-02-01 DIAGNOSIS — I10 Essential (primary) hypertension: Secondary | ICD-10-CM | POA: Insufficient documentation

## 2013-02-01 DIAGNOSIS — Z8679 Personal history of other diseases of the circulatory system: Secondary | ICD-10-CM | POA: Insufficient documentation

## 2013-02-01 DIAGNOSIS — Z79899 Other long term (current) drug therapy: Secondary | ICD-10-CM | POA: Insufficient documentation

## 2013-02-01 DIAGNOSIS — Z862 Personal history of diseases of the blood and blood-forming organs and certain disorders involving the immune mechanism: Secondary | ICD-10-CM | POA: Insufficient documentation

## 2013-02-01 DIAGNOSIS — Z87448 Personal history of other diseases of urinary system: Secondary | ICD-10-CM | POA: Insufficient documentation

## 2013-02-01 DIAGNOSIS — K219 Gastro-esophageal reflux disease without esophagitis: Secondary | ICD-10-CM | POA: Insufficient documentation

## 2013-02-01 NOTE — ED Provider Notes (Signed)
History     CSN: [id number]  Arrival date & time 02/01/13  1807   First MD Initiated Contact with Patient 02/01/13 2051      Chief Complaint  Patient presents with  . Headache    (Consider location/radiation/quality/duration/timing/severity/associated sxs/prior treatment) HPI  [name] is a 57 y.o. female past medical history significant for brain tumor and cluster headache complaining of severe headache. Headache started Sunday but worsened significantly yesterday and today. It is located on the top left side of her head and described as throbbing, waxing and waning she is currently in no pain. It is associated with nausea, watery eyes and states that she "just does not feel like herself". Rates her pain at its worst at 9/10. Patient denies change in her visual acuity she endorses floaters. Denies dysarthria, focal weakness, trauma, syncope, photo and phonophobia, fever, dysarthria, difficulty ambulating. CT from June of 2013 shows "Stable tiny high attenuation focus at foramen of Monro unchanged since previous exams, uncertain etiology." Patient Endorses neck stiffness x3 weeks when queried.    Past Medical History  Diagnosis Date  . Seasonal allergies   . Asthma     History of  . Hypertension   . Iron deficiency 12-07-2011  . Anemia   . GERD (gastroesophageal reflux disease)   . Heart murmur   . Osteoporosis   . PONV (postoperative nausea and vomiting)   . Dysrhythmia     occ palpitations   . Sleep apnea     no CPAP machine , borderine   . Headache     occasional   . Osteoarthritis     osteoarthritis   . Morbid obesity   . Acute urinary retention s/p Foley 01/07/2012  . Hemorrhoids, internal, with bleeding & prolapse 12/13/2011  . Myocardial infarction     unsure when; per cardiologist report  . Chronic headaches     Past Surgical History  Procedure Laterality Date  . Knee surgery      Both knees  . Total knee arthroplasty  04/29/2011    Left knee  . Multiple  tooth extractions    . Wisdom tooth extraction    . Colonoscopy  2009  . Knee arthroscopy  1991  . Transanal hemorrhoidal dearterliaization  01/06/12    with external hemorrhoid removal    Family History  Problem Relation Age of Onset  . Colon cancer Neg Hx   . Diabetes Mother   . Hypertension Mother   . Heart disease Sister     Congestive heart failure  . Diabetes type II Sister   . Kidney disease Brother     History  Substance Use Topics  . Smoking status: Never Smoker   . Smokeless tobacco: Never Used  . Alcohol Use: No    OB History   Grav Para Term Preterm Abortions TAB SAB Ect Mult Living                  Review of Systems  Constitutional: Negative for fever.  Respiratory: Negative for shortness of breath.   Cardiovascular: Negative for chest pain.  Gastrointestinal: Negative for nausea, vomiting, abdominal pain and diarrhea.  Neurological: Positive for headaches. Negative for dizziness and weakness.  All other systems reviewed and are negative.    Allergies  Cephalosporins; Codeine; Latex; Penicillins; and Prednisone  Home Medications   Current Outpatient Rx  Name  Route  Sig  Dispense  Refill  . albuterol (PROVENTIL HFA;VENTOLIN HFA) 108 (90 BASE) MCG/ACT inhaler  Inhalation   Inhale 2 puffs into the lungs every 6 (six) hours as needed. Wheezing         . aspirin 325 MG buffered tablet   Oral   Take 325 mg by mouth daily.         . cycloSPORINE (RESTASIS) 0.05 % ophthalmic emulsion   Both Eyes   Place 1 drop into both eyes 2 (two) times daily.         . diclofenac sodium (VOLTAREN) 1 % GEL   Topical   Apply 1 application topically 2 (two) times daily as needed. TOPICAL TO KNEE, SHOULDER, WRIST AND ANKLE FOR PAIN         . esomeprazole (NEXIUM) 40 MG packet   Oral   Take 40 mg by mouth daily before breakfast.   30 each   1   . fluticasone (VERAMYST) 27.5 MCG/SPRAY nasal spray   Nasal   Place 2 sprays into the nose daily.          . hydrochlorothiazide (MICROZIDE) 12.5 MG capsule   Oral   Take 12.5 mg by mouth daily.          . Multiple Vitamin (MULITIVITAMIN WITH MINERALS) TABS   Oral   Take 1 tablet by mouth 3 (three) times a week.          Bertram Gala Glycol-Propyl Glycol (SYSTANE FREE OP)   Both Eyes   Place 1 drop into both eyes daily.         . valsartan (DIOVAN) 160 MG tablet   Oral   Take 160 mg by mouth 2 (two) times daily.            BP 160/81  Pulse 76  Temp(Src) 97.9 F (36.6 C) (Oral)  Resp 18  SpO2 98%  Physical Exam  Nursing note and vitals reviewed. Constitutional: She is oriented to person, place, and time. She appears well-developed and well-nourished. No distress.  Obese  HENT:  Head: Normocephalic and atraumatic.  Mouth/Throat: Oropharynx is clear and moist.  Eyes: Conjunctivae and EOM are normal. Pupils are equal, round, and reactive to light.  Neck: Normal range of motion. Neck supple.  FROM, No meningeal signs.  Cardiovascular: Normal rate, regular rhythm and intact distal pulses.   Pulmonary/Chest: Effort normal and breath sounds normal. No stridor. No respiratory distress. She has no wheezes. She has no rales. She exhibits no tenderness.  Abdominal: Soft. Bowel sounds are normal.  Musculoskeletal: Normal range of motion.  Neurological: She is alert and oriented to person, place, and time.  Cranial nerves III through XII intact, strength 5 out of 5x4 extremities, negative pronator drift, finger to nose and heel-to-shin coordinated, sensation intact to pinprick and light touch, gait is coordinated and Romberg is negative.   Psychiatric: She has a normal mood and affect.    ED Course  Procedures (including critical care time)  Labs Reviewed - No data to display Ct Head Wo Contrast  02/01/2013  *RADIOLOGY REPORT*  Clinical Data: Headache  CT HEAD WITHOUT CONTRAST  Technique:  Contiguous axial images were obtained from the base of the skull through the vertex  without contrast.  Comparison: 05/15/2012  Findings: Small 4 mm high density is present in the anterior- superior third ventricle, unchanged from prior studies.  This most likely   is a colloid cyst.  No hydrocephalus.  Negative for acute infarct.  No hemorrhage is present.  Cerebral white matter intact.  Calvarium is intact.  IMPRESSION: Small hyperdensity  in the anterior third ventricle unchanged from multiple prior studies back to 2009.  This may represent a small colloid cyst.  Otherwise negative   Original Report Authenticated By: Janeece Riggers, M.D.      1. Headache       MDM    PRITI CONSOLI is a 57 y.o. female with headache that is unique in character and severity, now resolved. Neuro exam is normal, patient has no meningeal signs. CT shows unchanged small hyperdensity in the third ventricle.   Discussed case with attending who agrees with plan and stability to d/c to home. Low index of suspicion for [facility].   Filed Vitals:   02/01/13 1817 02/01/13 2111 02/01/13 2311  BP: 189/90 160/81 144/59  Pulse: 84 76 75  Temp: 97.9 F (36.6 C)    TempSrc: Oral    Resp: 20 18 18   SpO2: 93% 98% 99%     Pt verbalized understanding and agrees with care plan. Outpatient follow-up and return precautions given.           Wynetta Emery, PA-C 02/02/13 1611

## 2013-02-01 NOTE — ED Notes (Addendum)
Presents with "I have  the worst headache I have ever had. I have been suffering from them off and on but today and yesterday my headache is too sharp. It just feels like knife stabbing me. I Looked in my mirror and my eyes were red and I am not sleepy and I just don't feel like myself. " head pain associated with nausea. Pt is alert and oriented. PERRLA. Pain is on left side of head and into the very center top of head.  Pain comes and goes and she is not curently experiencing pain at this time. Answers all questions appropriately.  Denies light sensitivity, reports sound sensitivity.

## 2013-02-03 NOTE — ED Provider Notes (Signed)
Medical screening examination/treatment/procedure(s) were performed by non-physician practitioner and as supervising physician I was immediately available for consultation/collaboration.   Dione Booze, MD 02/03/13 [phone]

## 2013-02-10 ENCOUNTER — Other Ambulatory Visit: Payer: Self-pay | Admitting: Internal Medicine

## 2013-02-13 ENCOUNTER — Encounter: Payer: Self-pay | Admitting: Internal Medicine

## 2013-02-13 ENCOUNTER — Ambulatory Visit (INDEPENDENT_AMBULATORY_CARE_PROVIDER_SITE_OTHER): Payer: Medicare Other | Admitting: Internal Medicine

## 2013-02-13 VITALS — BP 128/78 | HR 86 | Temp 98.0°F | Ht 68.5 in | Wt 314.0 lb

## 2013-02-13 DIAGNOSIS — R51 Headache: Secondary | ICD-10-CM

## 2013-02-13 DIAGNOSIS — M1710 Unilateral primary osteoarthritis, unspecified knee: Secondary | ICD-10-CM

## 2013-02-13 DIAGNOSIS — I10 Essential (primary) hypertension: Secondary | ICD-10-CM

## 2013-02-13 DIAGNOSIS — K219 Gastro-esophageal reflux disease without esophagitis: Secondary | ICD-10-CM

## 2013-02-13 DIAGNOSIS — M1711 Unilateral primary osteoarthritis, right knee: Secondary | ICD-10-CM

## 2013-02-13 NOTE — Patient Instructions (Signed)
[name],  1. As per your wishes, I am giving you a neurology referral to see Dr. Andrey Campanile, whom you saw last time for headache.  2. Blood pressure is well controlled. Congratulations. If you have already discontinued Bystolic, please continue taking the current medications you are on for blood pressur. 3. Great that Nexium is working for you. Keep taking it.  4. Your mammogram was normal. Next one in one year.  See you in 3 months. Thanks [employer]

## 2013-02-15 NOTE — Progress Notes (Signed)
Subjective:     Patient ID: [name], female   DOB: 22-Nov-1956, 56 y.o.   MRN: [id number]  HPI [name] is a 57 year old african [place] female who comes in for follow-up on her hypertension and headache. She has a past medical history of GERD, MI, and osteoarthritis among other issues.   For her hypertension, she is on Diovan 160 and HCTZ 12.5 and seems to be well controlled today. She was on bystolic, however, she says the medicine made her feel sick, and she stopped taking it. She is compliant with her other medications.   For her chronic headache, for which she had an ER visit recently, work up has been done and CT scan recently done reveals small hyperdensity in the anterior third ventricle which might represent a colloid cyst. This is unchanged from before. The nature of her headache, as detailed in my previous note, seems unchanged. She has seen neurologist in teh past for this headache.   She is hugely benefited by nexium for her GERD.  Review of Systems She denies any chest pain, shortness of breath or visual changes.  Right knee pain, chronic    Objective:   Physical Exam  Constitutional: She is oriented to person, place, and time. She appears well-developed and well-nourished.  HENT:  Head: Normocephalic and atraumatic.  Nose: Nose normal.  Mouth/Throat: Oropharynx is clear and moist.  Eyes: Conjunctivae and EOM are normal. Pupils are equal, round, and reactive to light.  Neck: Normal range of motion. Neck supple.  Cardiovascular: Normal rate, regular rhythm, normal heart sounds and intact distal pulses.   No murmur heard. Pulmonary/Chest: Effort normal and breath sounds normal.  Abdominal: Soft. Bowel sounds are normal.  Musculoskeletal: Normal range of motion.  Lymphadenopathy:    She has no cervical adenopathy.  Neurological: She is alert and oriented to person, place, and time.  Skin: Skin is warm.  Psychiatric: She has a normal mood and affect.        Assessment & Plan:     1. Hypertension, controlled - We will keep the current medications.  2. Chronic headaches, likely migraines - Partial benefit with NSAIDS. With history of MI, use of triptans or ergots is not wise for acute control. History of kidney stones, thus cannot prescribe topiramate for prophylaxis. Propranolol might be a good alternative for her. However she wants to visit the neurologist whom she was evaluated by a long time ago. We will give her a referral.   3. Osteoarthritis - Right knee pain, chronic osteoarthritis. Left knee replaced. Benefit by rest and NSAIDS.  4. GERD - continue nexium.  5. Preventive: Current.

## 2013-02-28 ENCOUNTER — Other Ambulatory Visit: Payer: Self-pay | Admitting: *Deleted

## 2013-02-28 NOTE — Telephone Encounter (Signed)
Pt called and states pd stopped 11 years ago. Started with dk vag bleeding and now acting like a period. Pt used to go to [facility] - suggest to call practice and make appt hopefully next few days. Pt has Medicare. Stanton Kidney Sieanna Vanstone RN 02/28/13 4:30PM

## 2013-03-05 MED ORDER — VALSARTAN 160 MG PO TABS: 160.0000 mg | ORAL_TABLET | Freq: Two times a day (BID) | ORAL | Status: DC

## 2013-03-05 NOTE — Telephone Encounter (Signed)
Flag sent to front desk pool for appt. 

## 2013-03-05 NOTE — Telephone Encounter (Signed)
I called [name] and discussed there symptoms of vaginal bleeding. She has been having continuous heavy bleeding since the past two weeks. She denies chest pain, palpitations, fainting, dizziness, lightheadedness or other near syncopal symptoms at this time. She wishes to come in to the clinic, and I said that she should do it asap. I also suggested that she needs to see a gynecologist immediately, however she said that she would like to be referred to a specialist from the clinic.

## 2013-03-06 ENCOUNTER — Other Ambulatory Visit: Payer: Self-pay | Admitting: *Deleted

## 2013-03-06 ENCOUNTER — Ambulatory Visit (INDEPENDENT_AMBULATORY_CARE_PROVIDER_SITE_OTHER): Payer: Medicare Other | Admitting: Internal Medicine

## 2013-03-06 ENCOUNTER — Encounter: Payer: Self-pay | Admitting: Internal Medicine

## 2013-03-06 VITALS — BP 142/89 | HR 73 | Temp 98.7°F | Ht 68.5 in | Wt 313.0 lb

## 2013-03-06 DIAGNOSIS — N939 Abnormal uterine and vaginal bleeding, unspecified: Secondary | ICD-10-CM

## 2013-03-06 DIAGNOSIS — I10 Essential (primary) hypertension: Secondary | ICD-10-CM

## 2013-03-06 DIAGNOSIS — N814 Uterovaginal prolapse, unspecified: Secondary | ICD-10-CM

## 2013-03-06 DIAGNOSIS — N898 Other specified noninflammatory disorders of vagina: Secondary | ICD-10-CM

## 2013-03-06 DIAGNOSIS — N950 Postmenopausal bleeding: Secondary | ICD-10-CM

## 2013-03-06 LAB — CBC
Platelets: 231 10*3/uL (ref 150–400)
RDW: 14.6 % (ref 11.5–15.5)
WBC: 6.1 10*3/uL (ref 4.0–10.5)

## 2013-03-06 LAB — APTT: aPTT: 33 seconds (ref 24–37)

## 2013-03-06 LAB — PROTIME-INR: INR: 1 (ref <1.50)

## 2013-03-06 NOTE — Patient Instructions (Addendum)
Dear Ms Sayer  We met today and discussed about your vaginal bleeding. Please read and please comply with the following things we decided to do this visit for you.   DO NOT TAKE ASPIRIN UNTIL THE BLEEDING CONTINUES.  AVOID TAKING OVER THE COUNTER PAIN MEDICATIONS (NSAIDS)  We have done blood work this visit. Whenever we do blood work, we will call you with abnormal results.   We have urgently referred you to a gynecology service. Please keep the appointment.   Thanks, and take care,  Dr. Aletta Edouard MD MPH Clinical Assistant Professor [facility]  03/06/13 8:29 AM      Postmenopausal Bleeding Menopause is commonly referred to as the "change in life." It is a time when the fertile years, the time of ovulating and having menstrual periods, has come to an end. It is also determined by not having menstrual periods for 12 months.  Postmenopausal bleeding is any bleeding a woman has after she has entered into menopause. Any type of postmenopausal bleeding, even if it appears to be a typical menstrual period, is concerning. This should be evaluated by your caregiver.  CAUSES   Hormone therapy.  Cancer of the cervix or cancer of the lining of the uterus (endometrial cancer).  Thinning of the uterine lining (uterine atrophy).  Thyroid diseases.  Certain medicines.  Infection of the uterus or cervix.  Inflammation or irritation of the uterine lining (endometritis).  Estrogen-secreting tumors.  Growths (polyps) on the cervix, uterine lining, or uterus.  Uterine tumors (fibroids).  Being very overweight (obese). DIAGNOSIS  Your caregiver will take a medical history and ask questions. A physical exam will also be performed. Further tests may include:   A transvaginal ultrasound. An ultrasound wand or probe is inserted into your vagina to view the pelvic organs.  A biopsy of the lining of the uterus (endometrium). A sample of the endometrium is  removed and examined.  A hysteroscopy. Your caregiver may use an instrument with a light and a camera attached to it (hysteroscope). The hysteroscope is used to look inside the uterus for problems.  A dilation and curettage (D&C). Tissue is removed from the uterine lining to be examined for problems. TREATMENT  Treatment depends on the cause of the bleeding. Some treatments include:   Surgery.  Medicines.  Hormones.  A hysteroscopy or D&C to remove polyps or fibroids.  Changing or stopping a current medicine you are taking. Talk to your caregiver about your specific treatment. HOME CARE INSTRUCTIONS   Maintain a healthy weight.  Keep regular pelvic exams and Pap tests. SEEK MEDICAL CARE IF:   You have bleeding, even if it is light in comparison to your previous periods.  Your bleeding lasts more than 1 week.  You have abdominal pain.  You develop bleeding with sexual intercourse. SEEK IMMEDIATE MEDICAL CARE IF:   You have a fever, chills, headache, dizziness, muscle aches, and bleeding.  You have severe pain with bleeding.  You are passing blood clots.  You have bleeding and need more than 1 pad an hour.  You feel faint. MAKE SURE YOU:  Understand these instructions.  Will watch your condition.  Will get help right away if you are not doing well or get worse. Document Released: 02/16/2006 Document Revised: 01/31/2012 Document Reviewed: 07/15/2011 [facility] Patient Information 2013 [place], [place].

## 2013-03-06 NOTE — Progress Notes (Signed)
Subjective:     Patient ID: [name], female   DOB: 05-Apr-1956, 57 y.o.   MRN: [id number]  HPI Ms Cousineau is a 57 year old african [place] lady who comes in with the complaint of bleeding per vaginum since the past 9 days. It started gradually, initally looking like a brown residue, and then turning to pink and then red bloody discharge with clots. She is using about 4-5 pads a day which gets soaked completely, usually changing once at nights. She seems to be experiencing hot flashes. She says that she is getting fatigued and washed out. She does not feel dizzy, lightheadedness. She has been experiencing some discomfort in the lower abdomen (like menstrual cramps). She denies any growths or discharge other than blood through the vagina. She has not been sexually active in a long time. She does not have any vaginal itching.  She had a previous episode like this 11 year ago, when she bled for 30 days straight and she was found to have a uterine polyp, for which polypectomy was done. After than operation she stopped having her cycles and had her menopause (approximately 46 years). Her mother had menopause around 7-67 years of age.    Her last PAP smear done in 11/26/2011 was negative for any intraepithelial malignancy.    has a past medical history of Seasonal allergies; Asthma; Hypertension; Iron deficiency (12-07-2011); Anemia; GERD (gastroesophageal reflux disease); Heart murmur; Osteoporosis; PONV (postoperative nausea and vomiting); Dysrhythmia; Sleep apnea; Headache; Osteoarthritis; Morbid obesity; Acute urinary retention s/p Foley (01/07/2012); Hemorrhoids, internal, with bleeding & prolapse (12/13/2011); Myocardial infarction; and Chronic headaches.   Review of Systems  Constitutional: Positive for fatigue. Negative for fever and chills.  HENT: Negative for nosebleeds.   Respiratory: Negative for chest tightness and shortness of breath.   Cardiovascular: Negative for chest pain and  palpitations.  Gastrointestinal: Negative for nausea, blood in stool and anal bleeding. Abdominal pain: discomfort, slight 4/10.  Genitourinary: Positive for vaginal bleeding, menstrual problem and pelvic pain (discomfort). Negative for hematuria, vaginal discharge, difficulty urinating and vaginal pain.  Musculoskeletal: Negative for myalgias and joint swelling.  Skin: Negative for rash.  Neurological: Negative for dizziness, syncope and light-headedness.      Objective:   Physical Exam  Constitutional: She is oriented to person, place, and time.  obese  Eyes: Pupils are equal, round, and reactive to light.  Cardiovascular: Normal rate, regular rhythm, normal heart sounds and intact distal pulses.   Pulmonary/Chest: Effort normal and breath sounds normal.  Neurological: She is alert and oriented to person, place, and time.  Skin: Skin is warm.  Genitourinary exam - Bleeding around vagina externally. Internally, cervical os prolapsed till vaginal opening, bleeding through os visible, no masses felt on vaginal examination, difficult to assess uterine size due to obesity. No friability of cervical os seen, no masses visualized.       Assessment & Plan:     Abnormal postmenopausal uterine bleeding - We have given this patient an urgent referral to gynecologist (Appointment obtained for this thursday). At our end, the patient is not unstable. She does complain of some fatigue which is consistent with prolonged blood loss. There is no dizziness or chest pain. We will do CBC and coagulation testing. The patient's medication list was reviewed and she was asked to stop taking Aspirin and over the counter NSAIDs for now, as long as bleeding continues. Last pap reviewed - normal.    Hypertension - Mildly elevated BP today, stress likely.  We will continue current regimen.  Next visit - as scheduled in 3 months unless needed earlier.

## 2013-03-07 ENCOUNTER — Telehealth: Payer: Self-pay | Admitting: Internal Medicine

## 2013-03-07 NOTE — Telephone Encounter (Signed)
Conveyed the test results from 03/06/13. Reinforced that she needs to keep her appointment with the gynecologist on Thursday. She agrees to do that.

## 2013-03-08 ENCOUNTER — Ambulatory Visit (INDEPENDENT_AMBULATORY_CARE_PROVIDER_SITE_OTHER): Payer: Medicare Other | Admitting: Obstetrics & Gynecology

## 2013-03-08 ENCOUNTER — Other Ambulatory Visit (HOSPITAL_COMMUNITY)
Admission: RE | Admit: 2013-03-08 | Discharge: 2013-03-08 | Disposition: A | Discharge status: Home / Self Care | Payer: Medicare Other | Source: Ambulatory Visit | Attending: Obstetrics & Gynecology | Admitting: Obstetrics & Gynecology

## 2013-03-08 ENCOUNTER — Encounter: Payer: Self-pay | Admitting: Obstetrics & Gynecology

## 2013-03-08 VITALS — BP 139/89 | HR 77 | Temp 97.6°F | Ht 68.5 in | Wt 309.3 lb

## 2013-03-08 DIAGNOSIS — N8501 Benign endometrial hyperplasia: Secondary | ICD-10-CM | POA: Insufficient documentation

## 2013-03-08 DIAGNOSIS — N950 Postmenopausal bleeding: Secondary | ICD-10-CM

## 2013-03-08 MED ORDER — MEGESTROL ACETATE 20 MG PO TABS: 40.0000 mg | ORAL_TABLET | Freq: Every day | ORAL | Status: DC

## 2013-03-08 NOTE — Patient Instructions (Addendum)
Postmenopausal Bleeding Menopause is commonly referred to as the "change in life." It is a time when the fertile years, the time of ovulating and having menstrual periods, has come to an end. It is also determined by not having menstrual periods for 12 months.  Postmenopausal bleeding is any bleeding a woman has after she has entered into menopause. Any type of postmenopausal bleeding, even if it appears to be a typical menstrual period, is concerning. This should be evaluated by your caregiver.  CAUSES   Hormone therapy.  Cancer of the cervix or cancer of the lining of the uterus (endometrial cancer).  Thinning of the uterine lining (uterine atrophy).  Thyroid diseases.  Certain medicines.  Infection of the uterus or cervix.  Inflammation or irritation of the uterine lining (endometritis).  Estrogen-secreting tumors.  Growths (polyps) on the cervix, uterine lining, or uterus.  Uterine tumors (fibroids).  Being very overweight (obese). DIAGNOSIS  Your caregiver will take a medical history and ask questions. A physical exam will also be performed. Further tests may include:   A transvaginal ultrasound. An ultrasound wand or probe is inserted into your vagina to view the pelvic organs.  A biopsy of the lining of the uterus (endometrium). A sample of the endometrium is removed and examined.  A hysteroscopy. Your caregiver may use an instrument with a light and a camera attached to it (hysteroscope). The hysteroscope is used to look inside the uterus for problems.  A dilation and curettage (D&C). Tissue is removed from the uterine lining to be examined for problems. TREATMENT  Treatment depends on the cause of the bleeding. Some treatments include:   Surgery.  Medicines.  Hormones.  A hysteroscopy or D&C to remove polyps or fibroids.  Changing or stopping a current medicine you are taking. Talk to your caregiver about your specific treatment. HOME CARE INSTRUCTIONS    Maintain a healthy weight.  Keep regular pelvic exams and Pap tests. SEEK MEDICAL CARE IF:   You have bleeding, even if it is light in comparison to your previous periods.  Your bleeding lasts more than 1 week.  You have abdominal pain.  You develop bleeding with sexual intercourse. SEEK IMMEDIATE MEDICAL CARE IF:   You have a fever, chills, headache, dizziness, muscle aches, and bleeding.  You have severe pain with bleeding.  You are passing blood clots.  You have bleeding and need more than 1 pad an hour.  You feel faint. MAKE SURE YOU:  Understand these instructions.  Will watch your condition.  Will get help right away if you are not doing well or get worse. Document Released: 02/16/2006 Document Revised: 01/31/2012 Document Reviewed: 07/15/2011 [employer] Patient Information 2013 [employer].  

## 2013-03-08 NOTE — Progress Notes (Signed)
Subjective:     Patient ID: [name], female   DOB: [birth year], 57 y.o.   MRN: [id number]  HPI 57 yo G2P2002 LMP 2001 now with 12 days of bleeding    Review of Systems     Objective:   Physical Exam BP 139/89  Pulse 77  Temp(Src) 97.6 F (36.4 C) (Oral)  Ht 5' 8.5" (1.74 m)  Wt 309 lb 4.8 oz (140.298 kg)  BMI 46.34 kg/m2  LMP 02/24/2013  The indications for endometrial biopsy were reviewed.   Risks of the biopsy including cramping, bleeding, infection, uterine perforation, inadequate specimen and need for additional procedures  were discussed. The patient states she understands and agrees to undergo procedure today. Consent was signed. Time out was performed. Urine HCG was negative. A sterile speculum was placed in the patient's vagina and the cervix was prepped with Betadine. A single-toothed tenaculum was placed on the anterior lip of the cervix to stabilize it. The 3 mm pipelle was introduced into the endometrial cavity without difficulty to a depth of 10cm, and a moderate amount of tissue was obtained and sent to pathology. The instruments were removed from the patient's vagina. Minimal bleeding from the cervix was noted. The patient tolerated the procedure well.       Assessment:     PMPB     Plan:     Pelvic sono F/u endobx Megace 40mg  daily F/u 4 week  Routine post-procedure instructions were given to the patient.

## 2013-03-08 NOTE — Progress Notes (Signed)
Patient states she has not had a menstrual cycle in over 11 years, but on April 5 she started and has been bleeding since. Patient states her period will be heavy then go to spotting but then go back to heavy again with passing clots. Changes bright red to brown.

## 2013-03-13 ENCOUNTER — Ambulatory Visit (HOSPITAL_COMMUNITY)
Admission: RE | Admit: 2013-03-13 | Discharge: 2013-03-13 | Disposition: A | Discharge status: Home / Self Care | Payer: Medicare Other | Source: Ambulatory Visit | Attending: Obstetrics & Gynecology | Admitting: Obstetrics & Gynecology

## 2013-03-13 DIAGNOSIS — R9389 Abnormal findings on diagnostic imaging of other specified body structures: Secondary | ICD-10-CM | POA: Insufficient documentation

## 2013-03-13 DIAGNOSIS — N950 Postmenopausal bleeding: Secondary | ICD-10-CM

## 2013-03-14 ENCOUNTER — Encounter: Payer: Self-pay | Admitting: *Deleted

## 2013-03-15 ENCOUNTER — Telehealth: Payer: Self-pay | Admitting: *Deleted

## 2013-03-15 NOTE — Telephone Encounter (Signed)
Called [name] and notified of her of results and need to keep appt .Patient states does not have fu appt. Transferred to front desk to make 4 week fu appt. Also informed her of need to continue megace. Patient states it makes her sick- nauseous and hungry. Instructed her to take one tablet with food bid, and extra fluids and if that doesn;t help then call back and we can see if doctor wants to change her medication. Patient voices understanding.

## 2013-03-15 NOTE — Telephone Encounter (Signed)
Message copied by [facility], LINDA L on Thu Mar 15, 2013  1:32 PM ------      Message from: Willodean Rosenthal      Created: Wed Mar 14, 2013  8:55 AM       Please call pt.  She needs to continue to take the Megace for at least 6 months.  Some 'atypia' on the bx- no cancer.  Please keep f/u appt with me as scheduled.            Thx,      clh-S ------

## 2013-03-19 NOTE — Addendum Note (Signed)
Addended by: Neomia Dear on: 03/19/2013 05:18 PM   Modules accepted: Orders

## 2013-03-20 ENCOUNTER — Telehealth: Payer: Self-pay

## 2013-03-20 MED ORDER — MEDROXYPROGESTERONE ACETATE 10 MG PO TABS: 10.0000 mg | ORAL_TABLET | Freq: Every day | ORAL | Status: DC

## 2013-03-20 NOTE — Telephone Encounter (Signed)
Per Dr. Erin Fulling patient can be switched to provera 10mg  daily.  Will send to pharmacy and inform patient.

## 2013-03-20 NOTE — Telephone Encounter (Signed)
This patient called in for results from her endometrial biopsy and her U/S.  I informed her there were some abnormal cells but at this point it doesn't look like there is any evidence of carcinoma.  I informed her that she has an appointment with you to discuss results in detail on 04/05/13.  She was satisfied.  She then asked if she could change medications.  She is currently taking megace 40mg  daily and claims tachycardia and nausea when taking the medication.  I told her I would discuss this with her MD.

## 2013-03-21 NOTE — Telephone Encounter (Signed)
Called patient, no answer- left message to call us back at the clinics 

## 2013-03-21 NOTE — Telephone Encounter (Signed)
Patient called back and I informed her of new medication and that it is ready for her to pick up at [facility] on [address]. Patient verbalized understanding and had no further questions

## 2013-03-26 ENCOUNTER — Telehealth: Payer: Self-pay | Admitting: *Deleted

## 2013-03-26 NOTE — Telephone Encounter (Signed)
Pt is having more heavy bleeding over the weekend. Had to wear pullups. Just started taking the Provera today. Had not taken the Megace in a while. Spoke with Dr. Erin Fulling who recommended that she take the provera twice a day. Pt informed. States that she will try this. I told her if she becomes weak or feels it is necessary she can go to MAU. Pt agrees.

## 2013-03-28 ENCOUNTER — Encounter (HOSPITAL_COMMUNITY): Payer: Self-pay

## 2013-03-28 ENCOUNTER — Inpatient Hospital Stay (HOSPITAL_COMMUNITY)
Admission: AD | Admit: 2013-03-28 | Discharge: 2013-03-28 | Disposition: A | Discharge status: Home / Self Care | Payer: Medicare Other | Source: Ambulatory Visit | Attending: Obstetrics & Gynecology | Admitting: Obstetrics & Gynecology

## 2013-03-28 DIAGNOSIS — R9389 Abnormal findings on diagnostic imaging of other specified body structures: Secondary | ICD-10-CM | POA: Insufficient documentation

## 2013-03-28 DIAGNOSIS — D649 Anemia, unspecified: Secondary | ICD-10-CM

## 2013-03-28 DIAGNOSIS — N950 Postmenopausal bleeding: Secondary | ICD-10-CM | POA: Insufficient documentation

## 2013-03-28 DIAGNOSIS — D6489 Other specified anemias: Secondary | ICD-10-CM | POA: Insufficient documentation

## 2013-03-28 LAB — HEMOGLOBIN AND HEMATOCRIT, BLOOD: Hemoglobin: 10.6 g/dL — ABNORMAL LOW (ref 12.0–15.0)

## 2013-03-28 NOTE — MAU Provider Note (Signed)
History     CSN: [id number]  Arrival date & time 03/28/13  1246   None     Chief Complaint  Patient presents with  . Vaginal Bleeding    (Consider location/radiation/quality/duration/timing/severity/associated sxs/prior treatment) HPI [name] is 57 y.o. [phone] presents with heavy vaginal bleeding.  Patient is seen in the GYN CLINIC at [facility].  Has not had a menstrual cycle in 11 years started bleeding 1 month ago.  Was seen by Dr. Erin Fulling for an endometrial bx on 4/17.  At that time she was given Rx for Megace 40mg  qd.  She could not tolerate this medication--"Made me sick".  She called the clinic 03/26/13 and was instructed to take Provera 10mg  bid.   She has not taken 2 days of Provera.  States  The bleeding is slowing down but now has vaginal irritation, nausea and joint aching.  Has chills when she lies down.  Denies fever.   She has anemia in the past and was taking Fe supplements until she developed a hemorrhoid and had to have surgery.  They told her to avoid constipation so she is not taking Fe.    Past Medical History  Diagnosis Date  . Seasonal allergies   . Asthma     History of  . Hypertension   . Iron deficiency 12-07-2011  . Anemia   . GERD (gastroesophageal reflux disease)   . Heart murmur   . Osteoporosis   . PONV (postoperative nausea and vomiting)   . Dysrhythmia     occ palpitations   . Sleep apnea     no CPAP machine , borderine   . Headache     occasional   . Osteoarthritis     osteoarthritis   . Morbid obesity   . Acute urinary retention s/p Foley 01/07/2012  . Hemorrhoids, internal, with bleeding & prolapse 12/13/2011  . Myocardial infarction     unsure when; per cardiologist report  . Chronic headaches     Past Surgical History  Procedure Laterality Date  . Knee surgery      Both knees  . Total knee arthroplasty  04/29/2011    Left knee  . Multiple tooth extractions    . Wisdom tooth extraction    . Colonoscopy  2009  . Knee  arthroscopy  1991  . Transanal hemorrhoidal dearterliaization  01/06/12    with external hemorrhoid removal    Family History  Problem Relation Age of Onset  . Colon cancer Neg Hx   . Diabetes Mother   . Hypertension Mother   . Heart disease Sister     Congestive heart failure  . Diabetes type II Sister   . Kidney disease Brother     History  Substance Use Topics  . Smoking status: Never Smoker   . Smokeless tobacco: Never Used  . Alcohol Use: No    OB History   Grav Para Term Preterm Abortions TAB SAB Ect Mult Living   2 2 2  0 0 0 0 0 0 2      Review of Systems  Constitutional: Positive for chills. Negative for fever.  Respiratory: Negative.   Cardiovascular: Negative.   Genitourinary: Positive for vaginal bleeding (with clots). Negative for vaginal discharge.    Allergies  Cephalosporins; Codeine; Latex; Penicillins; and Prednisone  Home Medications  No current outpatient prescriptions on file.  BP 112/55  Pulse 90  Temp(Src) 99 F (37.2 C) (Oral)  Resp 16  SpO2 99%  LMP 02/24/2013  Physical Exam  Constitutional: She is oriented to person, place, and time. She appears well-developed and well-nourished. No distress.  HENT:  Head: Normocephalic.  Neck: Normal range of motion.  Cardiovascular: Normal rate.   Pulmonary/Chest: Effort normal.  Abdominal: Soft. She exhibits no mass. There is no tenderness. There is no rebound and no guarding.  Genitourinary: There is no rash, tenderness or lesion on the right labia. There is no rash, tenderness or lesion on the left labia. Uterus is not tender. Right adnexum displays no mass, no tenderness and no fullness. Left adnexum displays no mass, no tenderness and no fullness. There is bleeding (small amount of bleeding, bright red with several moderate size clots) around the vagina. No erythema or tenderness around the vagina. No vaginal discharge found.  Evaluation is difficult due to body habitus  Neurological: She is  alert and oriented to person, place, and time.  Skin: Skin is warm and dry.  Psychiatric: She has a normal mood and affect. Her behavior is normal.   HGB was 13.5  on 03/08/13.   Results for orders placed during the hospital encounter of 03/28/13 (from the past 48 hour(s))  POCT PREGNANCY, URINE     Status: None   Collection Time    03/28/13  1:07 PM      Result Value Range   Preg Test, Ur NEGATIVE  NEGATIVE   Comment:            THE SENSITIVITY OF THIS     METHODOLOGY IS >24 mIU/mL  HEMOGLOBIN AND HEMATOCRIT, BLOOD     Status: Abnormal   Collection Time    03/28/13  1:45 PM      Result Value Range   Hemoglobin 10.6 (*) 12.0 - 15.0 g/dL   HCT 41.3 (*) 24.4 - 01.0 %   ED Course  Procedures (including critical care time)   MDM  Patient had endometrial bx on 4/17  Showed simple and complex hyperplasia And on 4/22  Ultrasound showed thickening of the endometrium 26mm.  Discussed Recent GYN CLINIC visit, U/s, Lab, and  Endo Bx results as well as today's lab and physical findings with Dr. Debroah Loop.   Will keep current plan with completion of Provera.  May decrease to 1 tab po now that bleeding has lightened.  She is to follow up in the clinic.  Consider Mirena IUD per Dr. Debroah Loop  ASSESSMENT AND PLAN  A:  Postmenopausal bleeding       Low hemoglobin  P:  Follow up in the clinic as scheduled      Continue Provera and may now take 1 tab qd, since bleeding is slowing      Add Iron rich foods to diet.     Discussed Mirena with the patient so she will be prepared to discuss it as a possible treatment.

## 2013-03-28 NOTE — MAU Note (Signed)
Pt states has not had menstrual cycle in 11 years. Has been bleeding now for one month. Lower abd pain, vaginal cramping. Feels dehydrated ?from medications. Hx anemia. Changes pads q5/pad. Today is the slowest bleeding has been.

## 2013-04-05 ENCOUNTER — Ambulatory Visit (INDEPENDENT_AMBULATORY_CARE_PROVIDER_SITE_OTHER): Payer: Medicare Other | Admitting: Obstetrics & Gynecology

## 2013-04-05 ENCOUNTER — Encounter: Payer: Self-pay | Admitting: Obstetrics & Gynecology

## 2013-04-05 VITALS — BP 146/80 | HR 86 | Ht 68.5 in | Wt 309.5 lb

## 2013-04-05 DIAGNOSIS — N8502 Endometrial intraepithelial neoplasia [EIN]: Secondary | ICD-10-CM

## 2013-04-05 MED ORDER — MEDROXYPROGESTERONE ACETATE 10 MG PO TABS: 20.0000 mg | ORAL_TABLET | Freq: Every day | ORAL | Status: DC

## 2013-04-05 NOTE — Patient Instructions (Addendum)
You are being prescribed current medications to prevent progression of atypia to endometrial cancer!!!  Cancer of the Uterus The uterus is part of a woman's reproductive system. It is the hollow, pear-shaped organ where a baby grows. The uterus is in the pelvis between the bladder and the rectum. The narrow, lower portion of the uterus is the cervix. The fallopian tubes extend from either side of the top of the uterus to the ovaries. The wall of the uterus has two layers of tissue. The inner layer, or lining, is the endometrium. The outer layer is muscle tissue called the myometrium. In women of childbearing age, the lining of the uterus grows and thickens each month to prepare for pregnancy. If a woman does not become pregnant, the thick, bloody lining flows out of the body through the vagina. This flow is called menstruation. TYPES OF UTERINE CANCER  The most common type of cancer of the uterus begins in the lining (endometrium). It is called endometrial cancer, uterine cancer, or cancer of the uterus. It is seen in 2% to 3% of women.  A different type of cancer, uterine sarcoma, develops in the muscle (myometrium). Cancer that begins in the cervix is also a different type of cancer.  Rarely, a noncancerous fibroid tumor of the uterus develops into a sarcoma. CAUSES  No one knows the exact causes of uterine cancer. But it is clear that this disease is not contagious. No one can "catch" cancer from another person. Women who get this disease are more likely than other women to have certain risk factors. A risk factor is something that increases a person's chance of developing the disease.  Most women who have known risk factors do not get uterine cancer. On the other hand, many who do get this disease have none of these factors. Doctors can seldom explain why one woman gets uterine cancer and another does not.  Studies have found the following risk factors:  Age. Cancer of the uterus occurs mostly  in women over age 28.  Endometrial hyperplasia (enlarged endometrium). The risk of uterine cancer is higher if a woman has endometrial hyperplasia.  Hormone replacement therapy (HRT). HRT is used to control the symptoms of menopause, to prevent osteoporosis (thinning of the bones), and to reduce the risk of heart disease or stroke. Women who still have their uterus, and use estrogen without progesterone, have an increased risk of uterine cancer. Long-term use and large doses of estrogen seem to increase this risk. Women who use a combination of estrogen and progesterone have a lower risk of uterine cancer than women who use estrogen alone. The progesterone protects the uterus from developing cancer.  Obesity and related conditions. The body stores and releases some of its estrogen in fatty tissue. That is why obese women are more likely than thin women to have higher levels of estrogen in their bodies. High levels of estrogen may be the reason that obese women have an increased risk of developing uterine cancer. The risk of this disease is also higher in women with diabetes or high blood pressure. These conditions occur in many obese women.  Tamoxifen. Women taking the drug tamoxifen to prevent or treat breast cancer have an increased risk of uterine cancer. This risk appears to be related to the estrogen-like effect of this drug on the uterus.  Race. White women are more likely than African-American women to get uterine cancer.  Colorectal cancer. Women who have had an inherited form of colorectal cancer have  a higher risk of developing uterine cancer than other women.  Infertility.  Beginning menstrual periods before age 84.  Having menstrual periods after age 107.  History of cancer of the ovary or intestine.  Family history of uterine cancer.  Having diabetes, high blood pressure, thyroid or gallbladder disease.  Long-term use of high does of birth control pills. Birth control pills  today are low in hormone doses.  Radiation to the abdomen or pelvis.  Smoking. SYMPTOMS  Uterine cancer usually occurs after menopause. But it may also occur around the time that menopause begins. Abnormal vaginal bleeding is the most common symptom of uterine cancer. Bleeding may start as a watery, blood-streaked flow that gradually contains more blood. Women should not assume that abnormal vaginal bleeding is part of menopause. A woman should see her caregiver if she has any of the following symptoms:  Unusual vaginal bleeding or discharge.  Difficult or painful urination.  Pain during intercourse.  Pain in the pelvic area.  Increased girth (growth) of the stomach.  Any vaginal bleeding after menopause.  Unexplained weight loss. These symptoms can be caused by cancer or other less serious conditions. Most often they are not cancer. But a thorough evaluation is needed to be certain. DIAGNOSIS  If a woman has symptoms that suggest uterine cancer, her caregiver may check her general health and may order blood and urine tests. The caregiver also may perform one or more of these exams or tests.  Blood and urine tests and chest x-rays. The woman also may have:  Other X-rays.  CT scans.  Ultrasound test.  Magnetic resonance imaging (MRI).  Sigmoidoscopy.  Colonoscopy.  Pelvic exam. A woman will have a pelvic exam to check the vagina, uterus, bladder, and rectum. The caregiver feels these organs for any lumps or changes in their shape or size. To see the upper part of the vagina and the cervix, the caregiver inserts an instrument called a speculum into the vagina.  Pap test. The caregiver collects cells from the cervix and upper vagina. A medical laboratory checks for abnormal cells. The Pap test is better for detecting cancer of the cervix. But cells from inside the uterus usually do not show up on a Pap test. It is not a reliable test for uterine cancer.  Transvaginal  ultrasound. The medical caregiver inserts an instrument into the vagina. The instrument aims high-frequency sound waves at the uterus. The pattern of the echoes they produce creates a picture. If the endometrium looks too thick, the caregiver can do a biopsy.  Biopsy. The medical caregiver removes a sample of tissue from the uterine lining. This usually can be done in the caregiver's office.  Dilatation and Curettage (D&C). In some cases, a woman may need to have a D&C. D&C is usually done as same-day surgery with anesthesia in a hospital. A pathologist examines the tissue (lining of the uterus) to check for cancer cells and other conditions. STAGING   If uterine cancer is diagnosed, the caregiver needs to know the stage, or extent, of the disease to plan the best treatment. Staging is a careful attempt to find out whether the cancer has spread, and if so, to what parts of the body.  When uterine cancer spreads (metastasizes) outside the uterus, cancer cells are often found in nearby lymph nodes, nerves, or blood vessels. If the cancer has reached the lymph nodes, cancer cells may have spread to other lymph nodes and other organs of the body.  Staging is  done at the time of surgery. In most cases, the most reliable way to stage this disease is to remove the uterus, cervix, tubes, ovaries, and lymph nodes. A pathologist uses a microscope to examine the uterus and other tissues removed by the surgeon, to determine the extent of the cancer in the pelvis.  If lymph nodes have cancer cells, other parts of the body are examined, to see if it has spread to other organs. MAIN FEATURES OF EACH STAGE OF THE DISEASE: Stage I. The cancer is only in the body of the uterus. It is not in the cervix. Stage II. The cancer has spread from the body of the uterus to the cervix. Stage III. The cancer has spread outside the uterus, but not outside the pelvis (and not to the bladder or rectum). Lymph nodes in the pelvis  may contain cancer cells. Stage IV. The cancer has spread into the bladder or rectum. It may have spread beyond the pelvis to other body parts. TREATMENT  Women with uterine cancer have many treatment options. Most women with uterine cancer are treated with surgery. Some have radiation or chemotherapy. A smaller number of women may be treated with hormonal therapy. Some patients receive a combination of therapies. You may want to consult with another cancer doctor for a second opinion. The caregiver (usually a cancer doctor) is the best person to describe your treatment choices and to discuss the expected results of treatment. SURGERY  Most women with uterine cancer have surgery to remove the uterus, cervix, tubes, and ovaries (total hysterectomy). This is usually done through an incision in the abdomen.  The doctor may also remove the lymph nodes near the tumor, to see if they contain cancer. If cancer cells have reached the lymph nodes, it may mean that the disease has spread to other parts of the body. If cancer cells have not spread beyond the endometrium, the woman may not need to have any other treatment. The length of the hospital stay may vary from several days to a week. RADIATION THERAPY  In radiation therapy, high-energy rays are used to kill cancer cells. Like surgery, radiation therapy is a local therapy. It affects cancer cells only in the treated area.  Some women with Stage I, II, or III uterine cancer need both radiation therapy and surgery. They may have radiation before surgery to shrink the tumor, or after surgery to destroy any cancer cells that remain in the area. The doctor may suggest radiation treatments for the small number of women who cannot have surgery.  Doctors use two types of radiation therapy to treat uterine cancer:  External radiation. In external radiation therapy, a large machine outside the body is used to aim radiation at the tumor area. The woman usually does  not stay overnight (outpatient) at the hospital or clinic, and receives external radiation 5 days a week for several weeks. This schedule helps protect healthy cells and tissue by spreading out the total dose of radiation. No radioactive materials are put into the body for external radiation therapy.  Internal radiation. In internal radiation therapy, tiny tubes containing a radioactive substance are inserted through the vagina and cervix, into the uterus, and left in place for a few days. The woman stays in the hospital during this treatment. To protect others from radiation exposure, the patient may not be able to have visitors or may have visitors only for a short period of time while the implant is in place. Once the implant  is removed, the woman has no radioactivity in her body.  Some patients need both external and internal radiation therapies. CHEMOTHERAPY Chemotherapy is not usually used for endometrial cancer of the uterus. However, with sarcoma of the uterus or of the fibroid, it may be used in combination with surgery. Chemotherapy may also be used with recurring sarcoma, and in patients who cannot have surgery. HORMONE THERAPY Hormonal therapy involves substances that prevent cancer cells from multiplying or growing by attaching to hormone receptors. This causes changes in cancer cells. Before therapy begins, the caregiver may request a hormone receptor test. This special lab test of uterine tissue helps the caregiver learn if estrogen and progesterone receptors are present. If the tissue has receptors, the woman is more likely to respond to hormonal therapy.  Hormonal therapy is called a systemic therapy, because it can affect cancer cells throughout the body. Usually, hormonal therapy is a type of progesterone, taken as a pill or injection.  The doctor may use hormonal therapy for women with uterine cancer who are unable to have surgery or radiation therapy. Also, the doctor may give  hormonal therapy to women with uterine cancer that has spread to the lungs or other distant sites. It is also given to women with uterine cancer that has come back.  Hormonal therapy can cause a number of side effects. Women taking progesterone may retain fluid, have an increased appetite, and gain weight. Women who are still menstruating may have changes in their periods.  Hormone therapy can be used in combination with surgery or radiation. HOME CARE INSTRUCTIONS   Maintain a normal weight with a healthy balanced diet and exercise.  If you have diabetes, high blood pressure, thyroid or gallbladder disease, keep them in control with your caregiver's treatment and recommendations.  Do not smoke.  Do not take estrogen without taking progesterone with it, for menopausal symptoms.  Join a support group or get counseling, if you would like help dealing with your cancer.  If you are on hormone replacement therapy, see your caregiver as recommended, and be informed about the side effects of HRT.  Women with known risk factors should ask their caregiver what symptoms to look for and how often they should have an examination.  Keep your follow-up appointments and take your medicines as advised.  Write your questions down, and take them with you to your caregiver's appointments.  You may want another person to be with you for your appointments, so you do not miss any instructions. SEEK MEDICAL CARE IF:   You have any abnormal vaginal bleeding.  You are having menstrual periods at the age of 2 or older.  You have bleeding after sexual intercourse.  You are taking tomoxifen and develop vaginal bleeding.  Your stomach is growing, and you are not pregnant.  You have pain with sexual intercourse.  You have stomach or pelvis pain.  You have weight loss for no known reason.  You have pain or difficulty with urination. [facility]  Cancer Information Service (CIS)  provides accurate, up-to-date information on cancer to patients and their families, health professionals, and the general public:  Phone: 1-800-4-CANCER (684-829-2792).  Internet: http://www.cancer.gov NCI's website contains complete information about cancer causes and prevention, screening and diagnosis, treatment and survivorship, clinical trials, statistics, funding, training, and employment opportunities, and [employer] and its programs. CLINICAL TRIALS A woman who is interested in being part of a clinical trial should talk with her caregiver. NCI's website (http://www.johnson-fowler.biz/) provides general  information about clinical trials. It also offers detailed information about specific ongoing studies of uterine cancer by linking to PDQ, a cancer information database developed by the NCI. The Cancer Information Service at 1-800-4-CANCER can answer questions about cancer and provide information from the PDQ database. Document Released: 11/08/2005 Document Revised: 01/31/2012 Document Reviewed: 09/11/2009 [facility] Patient Information 2013 [place], [place]. Hysterectomy Information  A hysterectomy is a procedure where your uterus is surgically removed. It will no longer be possible to have menstrual periods or to become pregnant. The tubes and ovaries can be removed (bilateral salpingo-oopherectomy) during this surgery as well.  REASONS FOR A HYSTERECTOMY  Persistent, abnormal bleeding.  Lasting (chronic) pelvic pain or infection.  The lining of the uterus (endometrium) starts growing outside the uterus (endometriosis).  The endometrium starts growing in the muscle of the uterus (adenomyosis).  The uterus falls down into the vagina (pelvic organ prolapse).  Symptomatic uterine fibroids.  Precancerous cells.  Cervical cancer or uterine cancer. TYPES OF HYSTERECTOMIES  Supracervical hysterectomy. This type removes the top part of the uterus, but not the cervix.  Total hysterectomy.  This type removes the uterus and cervix.  Radical hysterectomy. This type removes the uterus, cervix, and the fibrous tissue that holds the uterus in place in the pelvis (parametrium). WAYS A HYSTERECTOMY CAN BE PERFORMED  Abdominal hysterectomy. A large surgical cut (incision) is made in the abdomen. The uterus is removed through this incision.  Vaginal hysterectomy. An incision is made in the vagina. The uterus is removed through this incision. There are no abdominal incisions.  Conventional laparoscopic hysterectomy. A thin, lighted tube with a camera (laparoscope) is inserted into 3 or 4 small incisions in the abdomen. The uterus is cut into small pieces. The small pieces are removed through the incisions, or they are removed through the vagina.  Laparoscopic assisted vaginal hysterectomy (LAVH). Three or four small incisions are made in the abdomen. Part of the surgery is performed laparoscopically and part vaginally. The uterus is removed through the vagina.  Robot-assisted laparoscopic hysterectomy. A laparoscope is inserted into 3 or 4 small incisions in the abdomen. A computer-controlled device is used to give the surgeon a 3D image. This allows for more precise movements of surgical instruments. The uterus is cut into small pieces and removed through the incisions or removed through the vagina. RISKS OF HYSTERECTOMY   Bleeding and risk of blood transfusion. Tell your caregiver if you do not want to receive any blood products.  Blood clots in the legs or lung.  Infection.  Injury to surrounding organs.  Anesthesia problems or side effects.  Conversion to an abdominal hysterectomy. WHAT TO EXPECT AFTER A HYSTERECTOMY  You will be given pain medicine.  You will need to have someone with you for the first 3 to 5 days after you go home.  You will need to follow up with your surgeon in 2 to 4 weeks after surgery to evaluate your progress.  You may have early menopause symptoms  like hot flashes, night sweats, and insomnia.  If you had a hysterectomy for a problem that was not a cancer or a condition that could lead to cancer, then you no longer need Pap tests. However, even if you no longer need a Pap test, a regular exam is a good idea to make sure no other problems are starting. Document Released: 05/04/2001 Document Revised: 01/31/2012 Document Reviewed: 06/19/2011 [facility] Patient Information 2013 [place], [place].

## 2013-04-05 NOTE — Progress Notes (Signed)
Subjective:     Patient ID: [name], female   DOB: November 09, 1956, 57 y.o.   MRN: [id number]  HPI Pt was unable to tolerate the Megace due to side effects.  She was switched to Provera and reports that she has fewer sx but, continues to bleed daily.  The bleeding has decreased but, she still has small clots.  This is 2 months of bleeding total for now.      Review of Systems     Objective:   Physical Exam BP 146/80  Pulse 86  Ht 5' 8.5" (1.74 m)  Wt 309 lb 8 oz (140.388 kg)  BMI 46.37 kg/m2  LMP 02/24/2013 Exam deferred  03/13/2013 TRANSABDOMINAL AND TRANSVAGINAL ULTRASOUND OF PELVIS  Technique: Both transabdominal and transvaginal ultrasound  examinations of the pelvis were performed. Transabdominal  technique was performed for global imaging of the pelvis including  uterus, ovaries, adnexal regions, and pelvic cul-de-sac.  It was necessary to proceed with endovaginal exam following the  transabdominal exam to visualize the endometrium and ovaries.  Comparison: None.  Findings:  Uterus: 8.9 x 5.0 x 6.0 cm. No fibroids identified.  Endometrium: Double layer thickness measures 26 mm. Somewhat  heterogeneous echogenicity of the endometrium noted but no focal  lesion visualized.  Right ovary: not directly visualized by transabdominal or  transvaginal sonography, however no adnexal mass identified.  Left ovary: not directly visualized by transabdominal or  transvaginal sonography, however no adnexal mass identified.  Other Findings: No free fluid  IMPRESSION:  1. Abnormal endometrial thickening measuring 26 mm. In the setting  of post-menopausal bleeding, endometrial sampling is indicated to  exclude carcinoma. If results are benign, sonohysterogram should  be considered for focal lesion work-up. (Ref: Radiological  Reasoning: Algorithmic Workup of Abnormal Vaginal Bleeding with  Endovaginal Sonography and Sonohysterography. AJR 2008; 191:S68-  73).  2. Nonvisualization of  ovaries, however no adnexal mass  identified.   4/17/204 Diagnosis Endometrium, biopsy - SIMPLE AND COMPLEX ENDOMETRIAL HYPERPLASIA WITH FOCAL ATYPIA.     Assessment:     Endometrial hyperplasia with atypia- d/w pt treatment options including medications; surgery with hysteroscopy vs hysterectomy      Plan:     Pt will cont meds for now and if she persists in bleeding will f/u to discuss surgical management F/u 6 weeks or sooner prn Increase Provera to 20mg  qHS

## 2013-04-24 ENCOUNTER — Encounter: Payer: Self-pay | Admitting: Internal Medicine

## 2013-04-24 ENCOUNTER — Ambulatory Visit (INDEPENDENT_AMBULATORY_CARE_PROVIDER_SITE_OTHER): Payer: Medicare Other | Admitting: Internal Medicine

## 2013-04-24 VITALS — BP 142/81 | HR 76 | Temp 97.6°F | Ht 68.0 in | Wt 316.9 lb

## 2013-04-24 DIAGNOSIS — N8502 Endometrial intraepithelial neoplasia [EIN]: Secondary | ICD-10-CM

## 2013-04-24 NOTE — Progress Notes (Signed)
Subjective:     Patient ID: [name], female   DOB: January 07, 1956, 57 y.o.   MRN: [id number]  HPI [name] was sent by me to [facility]' hospital for post menopausal bleeding. She has been found to have simple and complex hyperplasia of the endometrium with focal atypia and has been seen by Dr. Willodean Rosenthal and has been started on Provera to prevent progression to cancer. [name] has stopped bleeding 3 weeks ago and feels a lot better. She has no other complaints. Her hemoglobin has trended down because of the bleeding from 13.5 (4/15) to 10.6 (5/7). She stopped bleeding 1 week after the last HgB check and has been taking iron once a day and tolerating it well.   Review of Systems  Constitutional: Negative for diaphoresis, activity change and fatigue.  Respiratory: Negative for chest tightness, shortness of breath and wheezing.   Cardiovascular: Negative for chest pain, palpitations and leg swelling.  Neurological: Negative for dizziness, light-headedness and headaches.  Psychiatric/Behavioral: Negative.       Objective:   Physical Exam No acute distress Heart- S1S2 RRR, no murmur Lungs- Normal breathing sounds No pedal edema    Assessment & Plan:     1. Endometrial hyperplasia and atypia - Being followed at [facility] health. Bleeding stopped. On Provera. Feels better. Wants to take second opinion and she can do that by setting up an appointment with any gynecologist of her choice since she has medicare. Her bleeding stopped shortly after the Hgb was done and she is not reporting any symptoms of progressively worsening anemia, so I will refrain from checking a CBC today. I have asked her to try increasing her Iron to twice a day.  2. Hypertension, mildly elevated BP - [name] has been taking her Diovan once daily instead of twice daily. She will correct this from now on.   Next visit in 3 months.

## 2013-04-24 NOTE — Patient Instructions (Signed)
Ms Mccowen,  Please take your DIOVAN TWO TIMES DAY.  Please keep your gynecology appointment.  Please take Iron tablets two times a day.   See you in three months.  Thanks, Aletta Edouard MD MPH 04/24/2013 9:15 AM

## 2013-05-17 ENCOUNTER — Ambulatory Visit: Payer: Medicare Other | Admitting: Obstetrics & Gynecology

## 2013-05-18 ENCOUNTER — Telehealth: Payer: Self-pay | Admitting: *Deleted

## 2013-05-18 NOTE — Telephone Encounter (Signed)
Pt called in with c/o allergies for past week. She is c/o watery eyes, itching throat, mild cough, throat congestion. She has taken loratadine in the past and it worked well.  It is no longer on her med list as she has not needed it for awhile. Will you send in a Rx for her. Last visit 04/24/13

## 2013-05-21 MED ORDER — LORATADINE 10 MG PO CAPS: 10.0000 mg | ORAL_CAPSULE | Freq: Every day | ORAL | Status: DC

## 2013-05-21 NOTE — Telephone Encounter (Signed)
Prescription for Loratadine given.

## 2013-06-27 ENCOUNTER — Ambulatory Visit (INDEPENDENT_AMBULATORY_CARE_PROVIDER_SITE_OTHER): Payer: Medicare Other | Admitting: Obstetrics and Gynecology

## 2013-06-27 ENCOUNTER — Ambulatory Visit: Payer: Medicare Other | Admitting: Obstetrics and Gynecology

## 2013-06-27 VITALS — BP 158/88 | HR 78 | Ht 68.0 in | Wt 318.8 lb

## 2013-06-27 DIAGNOSIS — N8502 Endometrial intraepithelial neoplasia [EIN]: Secondary | ICD-10-CM | POA: Insufficient documentation

## 2013-06-27 NOTE — Progress Notes (Signed)
Patient ID: [name], female   DOB: 04-23-1956, 57 y.o.   MRN: [id number]  57 yo G2P2 diagnosed with complex hyperplasia with atypia currently medically managed with provera presenting today for follow up. Patient reports that she has been taking provera 10 mg daily. She is unable to tolerate 20 mg daily as prescribed. She describes daily episodes of sweating and soaking through her clothes (which she states she has never done before taking provera), she attributes the provera to a broken tooth. In any case, she has not had any further vaginal bleeding. Encouraged to continue taking provera daily.  RTC in 3 months for possible repeat endometrial biopsy. RTC sooner if bleeding occurs.

## 2013-08-03 ENCOUNTER — Emergency Department (INDEPENDENT_AMBULATORY_CARE_PROVIDER_SITE_OTHER)
Admission: EM | Admit: 2013-08-03 | Discharge: 2013-08-03 | Disposition: A | Discharge status: Home / Self Care | Payer: Medicare Other | Source: Home / Self Care | Attending: Family Medicine | Admitting: Family Medicine

## 2013-08-03 ENCOUNTER — Encounter (HOSPITAL_COMMUNITY): Payer: Self-pay | Admitting: Emergency Medicine

## 2013-08-03 DIAGNOSIS — R1013 Epigastric pain: Secondary | ICD-10-CM

## 2013-08-03 DIAGNOSIS — K219 Gastro-esophageal reflux disease without esophagitis: Secondary | ICD-10-CM

## 2013-08-03 DIAGNOSIS — K3189 Other diseases of stomach and duodenum: Secondary | ICD-10-CM

## 2013-08-03 MED ORDER — DEXLANSOPRAZOLE 60 MG PO CPDR: 60.0000 mg | DELAYED_RELEASE_CAPSULE | Freq: Every day | ORAL | Status: DC

## 2013-08-03 MED ORDER — ALUM & MAG HYDROXIDE-SIMETH 400-400-40 MG/5ML PO SUSP: 10.0000 mL | Freq: Four times a day (QID) | ORAL | Status: DC | PRN

## 2013-08-03 NOTE — ED Notes (Signed)
C/o abdominal cramping with nausea x 2 wks off/on. States the past 3 to 4 days sharp stabbing pains. Denies fever, v/d.  Some mild loose stools. Pt has tried nexium with no relief.

## 2013-08-03 NOTE — ED Provider Notes (Signed)
CSN: [id number]     Arrival date & time 08/03/13  1743 History   First MD Initiated Contact with Patient 08/03/13 1905     Chief Complaint  Patient presents with  . Abdominal Cramping    for 2 wks off/on. past 3 to 4 days sharp shooting pain off/on   (Consider location/radiation/quality/duration/timing/severity/associated sxs/prior Treatment) HPI Comments: 57 year old female presents complaining of abdominal pain, gradually worsening over the past 3 weeks. She says that initially it felt like a cramping pain that is very intermittent. It has gotten more frequent has gone from a cramping pain to more of a sharp stabbing pain. It is located diffusely across the abdomen. Sharp stabbing pains last for a few seconds and then go away completely. She also admits to some loose stools only in the past couple of days but she thinks this is because she has been eating lots of green leaf vegetables to try to alleviate some mild constipation. She has had abdominal pain in the past very similar to this that was associated with her Nexium. She's been on Nexium for about 3 years. She rates the pain as mild, approximately 4-5/10. She denies vomiting, fever, chills. No dark stools.  Patient is a 57 y.o. female presenting with cramps.  Abdominal Cramping Associated symptoms include abdominal pain. Pertinent negatives include no chest pain and no shortness of breath.    Past Medical History  Diagnosis Date  . Seasonal allergies   . Asthma     History of  . Hypertension   . Iron deficiency 12-07-2011  . Anemia   . GERD (gastroesophageal reflux disease)   . Heart murmur   . Osteoporosis   . PONV (postoperative nausea and vomiting)   . Dysrhythmia     occ palpitations   . Sleep apnea     no CPAP machine , borderine   . Headache(784.0)     occasional   . Osteoarthritis     osteoarthritis   . Morbid obesity   . Acute urinary retention s/p Foley 01/07/2012  . Hemorrhoids, internal, with bleeding & prolapse  12/13/2011  . Myocardial infarction     unsure when; per cardiologist report  . Chronic headaches    Past Surgical History  Procedure Laterality Date  . Knee surgery      Both knees  . Total knee arthroplasty  04/29/2011    Left knee  . Multiple tooth extractions    . Wisdom tooth extraction    . Colonoscopy  2009  . Knee arthroscopy  1991  . Transanal hemorrhoidal dearterliaization  01/06/12    with external hemorrhoid removal   Family History  Problem Relation Age of Onset  . Colon cancer Neg Hx   . Diabetes Mother   . Hypertension Mother   . Heart disease Sister     Congestive heart failure  . Diabetes type II Sister   . Kidney disease Brother    History  Substance Use Topics  . Smoking status: Never Smoker   . Smokeless tobacco: Never Used  . Alcohol Use: No   OB History   Grav Para Term Preterm Abortions TAB SAB Ect Mult Living   2 2 2  0 0 0 0 0 0 2     Review of Systems  Constitutional: Negative for fever and chills.  Eyes: Negative for visual disturbance.  Respiratory: Negative for cough and shortness of breath.   Cardiovascular: Negative for chest pain, palpitations and leg swelling.  Gastrointestinal: Positive for nausea, abdominal  pain and constipation. Negative for vomiting.  Endocrine: Negative for polydipsia and polyuria.  Genitourinary: Negative for dysuria, urgency and frequency.  Musculoskeletal: Negative for myalgias and arthralgias.  Skin: Negative for rash.  Neurological: Negative for dizziness, weakness and light-headedness.    Allergies  Cephalosporins; Codeine; Latex; Penicillins; and Prednisone  Home Medications   Current Outpatient Rx  Name  Route  Sig  Dispense  Refill  . NEXIUM 40 MG capsule      TAKE ONE CAPSULE BY MOUTH EVERY DAY BEFORE BREAKFAST   30 capsule   5   . valsartan (DIOVAN) 160 MG tablet   Oral   Take 1 tablet (160 mg total) by mouth 2 (two) times daily.   120 tablet   1   . acetaminophen (TYLENOL) 500 MG  tablet   Oral   Take 500 mg by mouth every 6 (six) hours as needed for pain.         Marland Kitchen albuterol (PROVENTIL HFA;VENTOLIN HFA) 108 (90 BASE) MCG/ACT inhaler   Inhalation   Inhale 2 puffs into the lungs every 6 (six) hours as needed. Wheezing         . alum & mag hydroxide-simeth (MYLANTA DOUBLE-STRENGTH) 400-400-40 MG/5ML suspension   Oral   Take 10 mLs by mouth every 6 (six) hours as needed (for cramping or bloating).   355 mL   0   . Casanthranol-Docusate Sodium (STOOL SOFTENER PLUS PO)   Oral   Take 2 tablets by mouth daily.         . cycloSPORINE (RESTASIS) 0.05 % ophthalmic emulsion   Both Eyes   Place 1 drop into both eyes 2 (two) times daily.         Marland Kitchen dexlansoprazole (DEXILANT) 60 MG capsule   Oral   Take 1 capsule (60 mg total) by mouth daily.   30 capsule   5   . diclofenac sodium (VOLTAREN) 1 % GEL   Topical   Apply 1 application topically 2 (two) times daily as needed. TOPICAL TO KNEE, SHOULDER, WRIST AND ANKLE FOR PAIN         . fluticasone (VERAMYST) 27.5 MCG/SPRAY nasal spray   Nasal   Place 2 sprays into the nose daily.         Marland Kitchen IRON-VITAMIN C PO   Oral   Take 1 tablet by mouth daily.          . Loratadine 10 MG CAPS   Oral   Take 1 capsule (10 mg total) by mouth daily.   15 each   1   . medroxyPROGESTERone (PROVERA) 10 MG tablet   Oral   Take 2 tablets (20 mg total) by mouth at bedtime.   60 tablet   3   . Multiple Vitamin (MULITIVITAMIN WITH MINERALS) TABS   Oral   Take 1 tablet by mouth 3 (three) times a week.          Bertram Gala Glycol-Propyl Glycol (SYSTANE FREE OP)   Both Eyes   Place 1 drop into both eyes daily.          BP 114/82  Pulse 73  Temp(Src) 98.2 F (36.8 C) (Oral)  Resp 16  SpO2 98%  LMP 02/20/2013 Physical Exam  Nursing note and vitals reviewed. Constitutional: She is oriented to person, place, and time. She appears well-developed and well-nourished. No distress.  Morbidly obese body habitus   HENT:  Head: Normocephalic and atraumatic.  Eyes: Pupils are equal, round, and reactive to  light.  Pulmonary/Chest: Effort normal. No respiratory distress.  Abdominal: Soft. Bowel sounds are normal. She exhibits no mass. There is no tenderness. There is no rebound and no guarding.  Musculoskeletal: Normal range of motion. She exhibits no tenderness.  Neurological: She is alert and oriented to person, place, and time. Coordination normal.  Skin: Skin is warm and dry. No rash noted. She is not diaphoretic.  Psychiatric: She has a normal mood and affect. Judgment normal.    ED Course  Procedures (including critical care time) Labs Review Labs Reviewed - No data to display Imaging Review No results found.  MDM   1. GERD (gastroesophageal reflux disease)   2. Dyspepsia    The abdomen is completely nontender and the vital signs are within normal limits, patient afebrile and nontoxic. This may in fact be related to Nexium, or may be related to stomach cramping do to change in diet. Treating with Mylanta and will change the Nexium to Dexillant.  Followup if not improving   Meds ordered this encounter  Medications  . alum & mag hydroxide-simeth (MYLANTA DOUBLE-STRENGTH) 400-400-40 MG/5ML suspension    Sig: Take 10 mLs by mouth every 6 (six) hours as needed (for cramping or bloating).    Dispense:  355 mL    Refill:  0  . dexlansoprazole (DEXILANT) 60 MG capsule    Sig: Take 1 capsule (60 mg total) by mouth daily.    Dispense:  30 capsule    Refill:  5       Graylon Good, PA-C 08/03/13 1946

## 2013-08-04 NOTE — ED Provider Notes (Signed)
Medical screening examination/treatment/procedure(s) were performed by a resident physician or non-physician practitioner and as the supervising physician I was immediately available for consultation/collaboration.  Clementeen Graham, MD   Rodolph Bong, MD 08/04/13 [phone]

## 2013-08-07 ENCOUNTER — Encounter: Payer: Self-pay | Admitting: Internal Medicine

## 2013-08-07 ENCOUNTER — Ambulatory Visit (INDEPENDENT_AMBULATORY_CARE_PROVIDER_SITE_OTHER): Payer: Medicare Other | Admitting: Internal Medicine

## 2013-08-07 VITALS — BP 128/84 | HR 75 | Temp 97.7°F | Ht 68.0 in | Wt 320.1 lb

## 2013-08-07 DIAGNOSIS — Z23 Encounter for immunization: Secondary | ICD-10-CM

## 2013-08-07 DIAGNOSIS — K219 Gastro-esophageal reflux disease without esophagitis: Secondary | ICD-10-CM

## 2013-08-07 MED ORDER — OMEPRAZOLE 20 MG PO CPDR: 20.0000 mg | DELAYED_RELEASE_CAPSULE | Freq: Two times a day (BID) | ORAL | Status: DC

## 2013-08-07 NOTE — Progress Notes (Signed)
Patient ID: [name], female   DOB: [birth year], 57 y.o.   MRN: [id number]   Subjective:   Patient ID: [name] female   DOB: Mar 08, 1956 57 y.o.   MRN: [id number]  HPI: [name] is a 57 y.o. female with a PMH of Complex endometrial hyperplasia, who presents for follow up from a recent urgent care visit where she was treated for crampy abdominal pain.  At that visit her pain was attributed to likely GERD and dyspepsia secondary to diet changes.  As patient complained about nexium possibly causing pain her PPI was changed to dexillant and Mylanta was also prescribed.   Today she reports that her crampy abdominal pain has actually been going on for months, it started in her extremities and worsened to involve her abdomen, she did not think to call in and make an appointment until it was too severe and thought it was best to go to urgent care.  She reports that many months ago she tried stopping Nexium for a few days and her cramping pain was relieved, however her heartburn was much worsened and so she resumed it. Of note she previously took nexium for heartburn for 3 years.  After her urgent care visit this time and stopping nexium she again reports her cramping pain has resolved but now reports pruritis on her hands and feet and occasional HA, both of which she attributes to Dexillant which she has now been taking.    Past Medical History  Diagnosis Date  . Seasonal allergies   . Asthma     History of  . Hypertension   . Iron deficiency 12-07-2011  . Anemia   . GERD (gastroesophageal reflux disease)   . Heart murmur   . Osteoporosis   . PONV (postoperative nausea and vomiting)   . Dysrhythmia     occ palpitations   . Sleep apnea     no CPAP machine , borderine   . Headache(784.0)     occasional   . Osteoarthritis     osteoarthritis   . Morbid obesity   . Acute urinary retention s/p Foley 01/07/2012  . Hemorrhoids, internal, with bleeding & prolapse 12/13/2011  .  Myocardial infarction     unsure when; per cardiologist report  . Chronic headaches    Current Outpatient Prescriptions  Medication Sig Dispense Refill  . acetaminophen (TYLENOL) 500 MG tablet Take 500 mg by mouth every 6 (six) hours as needed for pain.      Marland Kitchen albuterol (PROVENTIL HFA;VENTOLIN HFA) 108 (90 BASE) MCG/ACT inhaler Inhale 2 puffs into the lungs every 6 (six) hours as needed. Wheezing      . alum & mag hydroxide-simeth (MYLANTA DOUBLE-STRENGTH) 400-400-40 MG/5ML suspension Take 10 mLs by mouth every 6 (six) hours as needed (for cramping or bloating).  355 mL  0  . Casanthranol-Docusate Sodium (STOOL SOFTENER PLUS PO) Take 2 tablets by mouth daily.      . cycloSPORINE (RESTASIS) 0.05 % ophthalmic emulsion Place 1 drop into both eyes 2 (two) times daily.      Marland Kitchen dexlansoprazole (DEXILANT) 60 MG capsule Take 1 capsule (60 mg total) by mouth daily.  30 capsule  5  . diclofenac sodium (VOLTAREN) 1 % GEL Apply 1 application topically 2 (two) times daily as needed. TOPICAL TO KNEE, SHOULDER, WRIST AND ANKLE FOR PAIN      . fluticasone (VERAMYST) 27.5 MCG/SPRAY nasal spray Place 2 sprays into the nose daily.      Marland Kitchen  IRON-VITAMIN C PO Take 1 tablet by mouth daily.       . Loratadine 10 MG CAPS Take 1 capsule (10 mg total) by mouth daily.  15 each  1  . medroxyPROGESTERone (PROVERA) 10 MG tablet Take 2 tablets (20 mg total) by mouth at bedtime.  60 tablet  3  . Multiple Vitamin (MULITIVITAMIN WITH MINERALS) TABS Take 1 tablet by mouth 3 (three) times a week.       Marland Kitchen NEXIUM 40 MG capsule TAKE ONE CAPSULE BY MOUTH EVERY DAY BEFORE BREAKFAST  30 capsule  5  . Polyethyl Glycol-Propyl Glycol (SYSTANE FREE OP) Place 1 drop into both eyes daily.      . valsartan (DIOVAN) 160 MG tablet Take 1 tablet (160 mg total) by mouth 2 (two) times daily.  120 tablet  1   No current facility-administered medications for this visit.   Family History  Problem Relation Age of Onset  . Colon cancer Neg Hx   .  Diabetes Mother   . Hypertension Mother   . Heart disease Sister     Congestive heart failure  . Diabetes type II Sister   . Kidney disease Brother    History   Social History  . Marital Status: Single    Spouse Name: N/A    Number of Children: 2  . Years of Education: N/A   Occupational History  . Retired    Social History Main Topics  . Smoking status: Never Smoker   . Smokeless tobacco: Never Used  . Alcohol Use: No  . Drug Use: No  . Sexual Activity: Not Currently   Other Topics Concern  . None   Social History Narrative  . None   Review of Systems: Review of Systems  Constitutional: Negative for fever, chills, weight loss and malaise/fatigue.  HENT: Negative for congestion.   Eyes: Negative for blurred vision.  Respiratory: Negative for cough, sputum production and shortness of breath.   Cardiovascular: Negative for chest pain, palpitations and leg swelling.  Gastrointestinal: Negative for heartburn, nausea, vomiting, abdominal pain, diarrhea and constipation.  Genitourinary: Negative for dysuria.  Skin: Positive for itching. Negative for rash.  Neurological: Positive for headaches (mild). Negative for dizziness.  Psychiatric/Behavioral: Negative for depression.    Objective:  Physical Exam: Filed Vitals:   08/07/13 0940  BP: 154/85  Pulse: 75  Temp: 97.7 F (36.5 C)  TempSrc: Oral  Height: 5\' 8"  (1.727 m)  Weight: 320 lb 1.6 oz (145.196 kg)  SpO2: 99%  Physical Exam  Nursing note and vitals reviewed. Constitutional: She is well-developed, well-nourished, and in no distress. No distress.  HENT:  Head: Normocephalic and atraumatic.  Eyes: Conjunctivae are normal.  Cardiovascular: Normal rate, regular rhythm, normal heart sounds and intact distal pulses.   No murmur heard. Pulmonary/Chest: Effort normal and breath sounds normal. No respiratory distress. She has no wheezes. She has no rales.  Abdominal: Soft. Bowel sounds are normal. She exhibits no  distension. There is no tenderness.  Musculoskeletal: She exhibits edema (1+  pitting edema to ankles).  Skin: Skin is warm and dry. She is not diaphoretic.  Psychiatric: Affect and judgment normal.     Assessment & Plan:   See Problem Based Assessment and Plan

## 2013-08-07 NOTE — Patient Instructions (Signed)
1. Try taking Omeprazole 20mg  twice a day.  Omeprazole capsule What is this medicine? OMEPRAZOLE (oh ME pray zol) prevents the production of acid in the stomach. It is used to treat the symptoms of heartburn. You can buy this medicine without a prescription. This product is not for long-term use, unless otherwise directed by your doctor or health care professional. This medicine may be used for other purposes; ask your health care provider or pharmacist if you have questions. What should I tell my health care provider before I take this medicine? They need to know if you have any of these conditions: -black or bloody stools -chest pain -difficulty swallowing -have had heartburn for over 3 months -have heartburn with dizziness, lightheadedness or sweating -liver disease -stomach pain -unexplained weight loss -vomiting with blood -wheezing -an unusual or allergic reaction to omeprazole, other medicines, foods, dyes, or preservatives -pregnant or trying to get pregnant -breast-feeding How should I use this medicine? Take this medicine by mouth. Follow the directions on the product label. If you are taking this medicine without a prescription, take one tablet every day. Do not use for longer than 14 days or repeat a course of treatment more often than every 4 months unless directed by a doctor or healthcare professional. Take your dose at regular intervals every 24 hours. Swallow the tablet whole with a drink of water. Do not crush, break or chew. This medicine works best if taken on an empty stomach 30 minutes before breakfast. If you are using this medicine with the prescription of your doctor or healthcare professional, follow the directions you were given. Do not take your medicine more often than directed. Talk to your pediatrician regarding the use of this medicine in children. Special care may be needed. Overdosage: If you think you've taken too much of this medicine contact a poison  control center or emergency room at once. Overdosage: If you think you have taken too much of this medicine contact a poison control center or emergency room at once. NOTE: This medicine is only for you. Do not share this medicine with others. What if I miss a dose? If you miss a dose, take it as soon as you can. If it is almost time for your next dose, take only that dose. Do not take double or extra doses. What may interact with this medicine? Do not take this medicine with any of the following medications: -atazanavir -certain medicines that treat or prevent blood clots like clopidogrel, warfarin -nelfinavir  This medicine may also interact with the following medications: -ampicillin -certain medicines for anxiety or sleep -cyclosporine -diazepam -digoxin -disulfiram -iron salts -phenytoin -prescription medicine for fungal or yeast infection like itraconazole, ketoconazole, voriconazole -saquinavir -tacrolimus This list may not describe all possible interactions. Give your health care provider a list of all the medicines, herbs, non-prescription drugs, or dietary supplements you use. Also tell them if you smoke, drink alcohol, or use illegal drugs. Some items may interact with your medicine. What should I watch for while using this medicine? It can take several days before your heartburn gets better. Check with your doctor or health care professional if your condition does not start to get better, or if it gets worse. Do not treat yourself for heartburn with this medicine for more than 14 days in a row. You should only use this medicine for a 2-week treatment period once every 4 months. If your symptoms return shortly after your therapy is complete, or within the 4 month  time frame, call your doctor or health care professional. What side effects may I notice from receiving this medicine? Side effects that you should report to your doctor or health care professional as soon as  possible: -blood in urine -bone, muscle or joint pain -chest pain or tightness -dark yellow or brown urine -fever or sore throat -redness, blistering, peeling or loosening of the skin, including inside the mouth -shortness of breath -skin rash -yellowing of the eyes or skin  Side effects that usually do not require medical attention (Report these to your doctor or health care professional if they continue or are bothersome.): -diarrhea or constipation -headache This list may not describe all possible side effects. Call your doctor for medical advice about side effects. You may report side effects to FDA at 1-800-FDA-1088. Where should I keep my medicine? Keep out of the reach of children. Store at room temperature between 20 and 25 degrees C (68 and 77 degrees F). Protect from light and moisture. Throw away any unused medicine after the expiration date. NOTE: This sheet is a summary. It may not cover all possible information. If you have questions about this medicine, talk to your doctor, pharmacist, or health care provider.  2012, [employer]. (09/09/2010 10:19:01 PM)

## 2013-08-07 NOTE — Assessment & Plan Note (Signed)
Patient's reflux symptoms were previous well controlled with Nexium and although her crampy extremity and abdominal pain seems to be a very uncommon reaction to Nexium it appears to have improved after stopping the medication.  However Dexillant appears to be cause pruritis and some mild HA.  Per patient's formulary I will try to prescribe omeprazole 20mg  BID to see how she tolerates the medication, if she is unable to tolerate it she may need to be changed to Protonix by prior authorization as patient will have failed all 3 tier one medications and reported that she tolerate Protonix well before in the hospital.

## 2013-08-08 NOTE — Progress Notes (Signed)
INTERNAL MEDICINE TEACHING ATTENDING ADDENDUM - Jonah Blue, DO, FACP: I personally saw and evaluated [name] in this clinic visit in conjunction with the resident, Carlynn Purl, DO. I have discussed patient's plan of care with medical resident during this visit. I have confirmed the physical exam findings and have read and agree with the clinic note including the plan.

## 2013-08-21 ENCOUNTER — Other Ambulatory Visit: Payer: Self-pay | Admitting: Internal Medicine

## 2013-08-27 ENCOUNTER — Ambulatory Visit: Payer: Medicare Other | Admitting: Internal Medicine

## 2013-08-28 ENCOUNTER — Ambulatory Visit (INDEPENDENT_AMBULATORY_CARE_PROVIDER_SITE_OTHER): Payer: Medicare Other | Admitting: Internal Medicine

## 2013-08-28 ENCOUNTER — Ambulatory Visit (HOSPITAL_COMMUNITY)
Admission: RE | Admit: 2013-08-28 | Discharge: 2013-08-28 | Disposition: A | Discharge status: Home / Self Care | Payer: Medicare Other | Source: Ambulatory Visit | Attending: Internal Medicine | Admitting: Internal Medicine

## 2013-08-28 ENCOUNTER — Encounter: Payer: Self-pay | Admitting: Internal Medicine

## 2013-08-28 VITALS — BP 153/88 | HR 86 | Temp 97.9°F | Ht 68.5 in | Wt 320.2 lb

## 2013-08-28 DIAGNOSIS — M25569 Pain in unspecified knee: Secondary | ICD-10-CM

## 2013-08-28 DIAGNOSIS — M0520 Rheumatoid vasculitis with rheumatoid arthritis of unspecified site: Secondary | ICD-10-CM

## 2013-08-28 DIAGNOSIS — I10 Essential (primary) hypertension: Secondary | ICD-10-CM

## 2013-08-28 DIAGNOSIS — M79609 Pain in unspecified limb: Secondary | ICD-10-CM | POA: Insufficient documentation

## 2013-08-28 DIAGNOSIS — I7789 Other specified disorders of arteries and arterioles: Secondary | ICD-10-CM

## 2013-08-28 DIAGNOSIS — N950 Postmenopausal bleeding: Secondary | ICD-10-CM

## 2013-08-28 DIAGNOSIS — M948X9 Other specified disorders of cartilage, unspecified sites: Secondary | ICD-10-CM | POA: Insufficient documentation

## 2013-08-28 LAB — SEDIMENTATION RATE: Sed Rate: 40 mm/hr — ABNORMAL HIGH (ref 0–22)

## 2013-08-28 MED ORDER — DICLOFENAC SODIUM 1 % TD GEL: 2.0000 g | Freq: Two times a day (BID) | TRANSDERMAL | Status: DC | PRN

## 2013-08-28 MED ORDER — IRON-VITAMIN C 100-250 MG PO TABS: 1.0000 | ORAL_TABLET | Freq: Every day | ORAL | Status: DC

## 2013-08-28 MED ORDER — IBUPROFEN 400 MG PO TABS: 800.0000 mg | ORAL_TABLET | Freq: Three times a day (TID) | ORAL | Status: DC | PRN

## 2013-08-28 MED ORDER — VALSARTAN 160 MG PO TABS: 160.0000 mg | ORAL_TABLET | Freq: Every day | ORAL | Status: DC

## 2013-08-28 NOTE — Assessment & Plan Note (Signed)
Patient's clinical history and physical examination findings point towards a possibility of rheumatoid arthritis versus osteoarthritis. No features of infection or fluid accumulation.  Plan. -Will do a full evaluation for rheumatoid arthritis, including ESR, RF, CRP, CCP, ANA, and bilateral, x-rays of her hands. - Motrin 800 mg every 8 hours when necessary with meals.  - cont with Prilosec - if rheumatoid arthritis is confirmed she will require DMARDS - she will follow up in 2 weeks

## 2013-08-28 NOTE — Assessment & Plan Note (Signed)
Blood pressure is elevated at 153/88. Patient did not take his Diovan this morning.  Plan -Encourage her to take all her medications as prescribed. - I did not feel compelled to change her medications today. - will followup in 2 weeks.

## 2013-08-28 NOTE — Patient Instructions (Signed)
We will check some labs today to look for Rheumatoid arthritis  We will do x ray of your hands  Please take Ibuprofen 800 mg every 8 hour as needed for pain. Take it with food  Please follow up in 2 weeks

## 2013-08-28 NOTE — Progress Notes (Signed)
Patient ID: [name], female   DOB: Oct 08, 1956, 57 y.o.   MRN: [id number]   Subjective:   HPI: [name] is a 57 y.o. with past medical history of hypertension, GERD, osteoporosis, and osteoarthritis presents with complaints of worsening pain in her hands over the last 2 weeks.   Patient reports that she has chronic joint problems for the last 10 years. However, symptoms seem to worsen over the last 2 weeks with increased stiffness in the morning involving her hands bilaterally. The pain tends to improve as the day goes on. She denies fevers, chills, fatigue, or any other constitutional symptoms. She is concerned about the possibility of rheumatoid arthritis. She is also concerned about this being a side effect of Provera, which she started 6 months ago. She reports, that she's been off her usual acetaminophen due side effects of headache. She also reports to me that she has been tried on multiple pain medications in the past, but she tends to have lots of side effects from these. She has tried Motrin before without side effects.  She is tolerating her omeprazole, which was changed during her last office visit.  Blood pressure is elevated at 153/88. She did not take her Diovan this morning.    Past Medical History  Diagnosis Date  . Seasonal allergies   . Asthma     History of  . Hypertension   . Iron deficiency 12-07-2011  . Anemia   . GERD (gastroesophageal reflux disease)   . Heart murmur   . Osteoporosis   . PONV (postoperative nausea and vomiting)   . Dysrhythmia     occ palpitations   . Sleep apnea     no CPAP machine , borderine   . Headache(784.0)     occasional   . Osteoarthritis     osteoarthritis   . Morbid obesity   . Acute urinary retention s/p Foley 01/07/2012  . Hemorrhoids, internal, with bleeding & prolapse 12/13/2011  . Myocardial infarction     unsure when; per cardiologist report  . Chronic headaches    Current Outpatient Prescriptions    Medication Sig Dispense Refill  . acetaminophen (TYLENOL) 500 MG tablet Take 500 mg by mouth every 6 (six) hours as needed for pain.      Marland Kitchen albuterol (PROVENTIL HFA;VENTOLIN HFA) 108 (90 BASE) MCG/ACT inhaler Inhale 2 puffs into the lungs every 6 (six) hours as needed. Wheezing      . alum & mag hydroxide-simeth (MYLANTA DOUBLE-STRENGTH) 400-400-40 MG/5ML suspension Take 10 mLs by mouth every 6 (six) hours as needed (for cramping or bloating).  355 mL  0  . Casanthranol-Docusate Sodium (STOOL SOFTENER PLUS PO) Take 2 tablets by mouth daily.      . cycloSPORINE (RESTASIS) 0.05 % ophthalmic emulsion Place 1 drop into both eyes 2 (two) times daily.      . diclofenac sodium (VOLTAREN) 1 % GEL Apply 2 g topically 2 (two) times daily as needed. TOPICAL TO KNEE, SHOULDER, WRIST AND ANKLE FOR PAIN  1 Tube  0  . Fluocinolone Acetonide 0.01 % OIL       . fluticasone (VERAMYST) 27.5 MCG/SPRAY nasal spray Place 2 sprays into the nose daily.      Marland Kitchen ibuprofen (ADVIL,MOTRIN) 400 MG tablet Take 2 tablets (800 mg total) by mouth every 8 (eight) hours as needed for pain.  30 tablet  0  . Iron-Vitamin C 100-250 MG TABS Take 1 tablet by mouth daily.  60 each  2  .  Loratadine 10 MG CAPS Take 1 capsule (10 mg total) by mouth daily.  15 each  1  . medroxyPROGESTERone (PROVERA) 10 MG tablet Take 2 tablets (20 mg total) by mouth at bedtime.  60 tablet  3  . Multiple Vitamin (MULITIVITAMIN WITH MINERALS) TABS Take 1 tablet by mouth 3 (three) times a week.       Marland Kitchen omeprazole (PRILOSEC) 20 MG capsule Take 1 capsule (20 mg total) by mouth 2 (two) times daily.  60 capsule  1  . Polyethyl Glycol-Propyl Glycol (SYSTANE FREE OP) Place 1 drop into both eyes daily.      . valsartan (DIOVAN) 160 MG tablet Take 1 tablet (160 mg total) by mouth daily.  120 tablet  3   No current facility-administered medications for this visit.   Family History  Problem Relation Age of Onset  . Colon cancer Neg Hx   . Diabetes Mother   .  Hypertension Mother   . Heart disease Sister     Congestive heart failure  . Diabetes type II Sister   . Kidney disease Brother    History   Social History  . Marital Status: Single    Spouse Name: N/A    Number of Children: 2  . Years of Education: N/A   Occupational History  . Retired    Social History Main Topics  . Smoking status: Never Smoker   . Smokeless tobacco: Never Used  . Alcohol Use: No  . Drug Use: No  . Sexual Activity: Not Currently   Other Topics Concern  . None   Social History Narrative  . None   Review of Systems: Constitutional: Denies fever, chills, diaphoresis, appetite change and fatigue.  Respiratory: Denies SOB, DOE, cough, chest tightness, and wheezing. Denies chest pain. Cardiovascular: No chest pain, palpitations and leg swelling.  Gastrointestinal: No abdominal pain, nausea, vomiting, bloody stools Genitourinary: No dysuria, frequency, hematuria, or flank pain.  Psych: No depression symptoms. No SI or SA.   Objective:  Physical Exam: Filed Vitals:   08/28/13 1000  BP: 153/88  Pulse: 86  Temp: 97.9 F (36.6 C)  TempSrc: Oral  Height: 5' 8.5" (1.74 m)  Weight: 320 lb 3.2 oz (145.242 kg)  SpO2: 99%   General:  Morbidly obese, No acute distress.  HEENT: Normal oral mucosa. MMM.  Lungs: CTA bilaterally. Heart: RRR; no extra sounds or murmurs  Abdomen: Non-distended, normal BS, soft, nontender; no hepatosplenomegaly  Extremities: bilateral symmetric, MIP and DIP joints mild to moderate tenderness.  No overt features of inflammation or infection.Pulses are present. Skin is normal. Neurologic: Normal EOM,  Alert and oriented x3. No obvious neurologic/cranial nerve deficits.  Assessment & Plan:  I have discussed my assessment and plan  with  my attending in the clinic, Dr. Josem Kaufmann as detailed under problem based charting.

## 2013-08-29 LAB — ANA: Anti Nuclear Antibody(ANA): NEGATIVE

## 2013-08-29 NOTE — Progress Notes (Signed)
Case discussed with Dr. Zada Girt at the time of the visit.  We reviewed the resident's history and exam and pertinent patient test results.  I agree with the assessment, diagnosis and plan of care documented in the resident's note.  Right hand with some small erosions that could be consistent with RA, the left hand (read by a different radiologist) was normal.  CCP was negative as was repeat RF.  ESR and CRP were slightly elevated.  Using the 2010 ACR-EULAR classification criteria for rheumatoid arthritis she would score 4 points which does not qualify for a diagnosis of rheumatoid arthritis at this time.  We will continue to follow closely and reassess as she may eventually meet criteria as her process evolves.

## 2013-09-06 ENCOUNTER — Ambulatory Visit (INDEPENDENT_AMBULATORY_CARE_PROVIDER_SITE_OTHER): Payer: Medicare Other | Admitting: Internal Medicine

## 2013-09-06 ENCOUNTER — Encounter: Payer: Self-pay | Admitting: Internal Medicine

## 2013-09-06 VITALS — BP 170/99 | HR 85 | Temp 98.0°F | Ht 68.5 in | Wt 320.2 lb

## 2013-09-06 DIAGNOSIS — K219 Gastro-esophageal reflux disease without esophagitis: Secondary | ICD-10-CM

## 2013-09-06 DIAGNOSIS — B86 Scabies: Secondary | ICD-10-CM | POA: Insufficient documentation

## 2013-09-06 DIAGNOSIS — I10 Essential (primary) hypertension: Secondary | ICD-10-CM

## 2013-09-06 MED ORDER — ESOMEPRAZOLE MAGNESIUM 40 MG PO CPDR: 40.0000 mg | DELAYED_RELEASE_CAPSULE | Freq: Every day | ORAL | Status: DC

## 2013-09-06 MED ORDER — PERMETHRIN 5 % EX CREA: TOPICAL_CREAM | Freq: Once | CUTANEOUS | Status: DC

## 2013-09-06 NOTE — Patient Instructions (Addendum)
It was a pleasure seeing you in clinic today!  We believe you have scabies. We are stopping your omeprazole as well in case this is the cause of your itching. To treat it scabies we are prescribing you Permethrin 5% cream. Apply this from your neck to your toes before bedtime. Leave on for 8-14 hours before washing it off. Also, be sure to wash ALL your clothes and linens with HOT water prior to using again. To replace your omeprazole, we are giving you Nexium 40 mg to take once a day by mouth.  Please follow up with your PCP, Dr. Dalphine Handing, or anyone else in the clinic in 2 weeks to ensure resolution of your rash.  Scabies Scabies are small bugs (mites) that burrow under the skin and cause red bumps and severe itching. These bugs can only be seen with a microscope. Scabies are highly contagious. They can spread easily from person to person by direct contact. They are also spread through sharing clothing or linens that have the scabies mites living in them. It is not unusual for an entire family to become infected through shared towels, clothing, or bedding.  HOME CARE INSTRUCTIONS   Your caregiver may prescribe a cream or lotion to kill the mites. If cream is prescribed, massage the cream into the entire body from the neck to the bottom of both feet. Also massage the cream into the scalp and face if your child is less than 75 year old. Avoid the eyes and mouth. Do not wash your hands after application.  Leave the cream on for 8 to 12 hours. Your child should bathe or shower after the 8 to 12 hour application period. Sometimes it is helpful to apply the cream to your child right before bedtime.  One treatment is usually effective and will eliminate approximately 95% of infestations. For severe cases, your caregiver may decide to repeat the treatment in 1 week. Everyone in your household should be treated with one application of the cream.  New rashes or burrows should not appear within 24 to 48 hours  after successful treatment. However, the itching and rash may last for 2 to 4 weeks after successful treatment. Your caregiver may prescribe a medicine to help with the itching or to help the rash go away more quickly.  Scabies can live on clothing or linens for up to 3 days. All of your child's recently used clothing, towels, stuffed toys, and bed linens should be washed in hot water and then dried in a dryer for at least 20 minutes on high heat. Items that cannot be washed should be enclosed in a plastic bag for at least 3 days.  To help relieve itching, bathe your child in a cool bath or apply cool washcloths to the affected areas.  Your child may return to school after treatment with the prescribed cream. SEEK MEDICAL CARE IF:   The itching persists longer than 4 weeks after treatment.  The rash spreads or becomes infected. Signs of infection include red blisters or yellow-tan crust. Document Released: 11/08/2005 Document Revised: 01/31/2012 Document Reviewed: 03/19/2009 [facility] Patient Information 2014 [place], [place].

## 2013-09-06 NOTE — Progress Notes (Signed)
Subjective:     Patient ID: [name], female   DOB: 14-Jun-1956, 57 y.o.   MRN: [id number]  HPI This is a 57 yo F w/ hx of joint pains, possible RA, and GERD presenting with itchiness of 6 days duration. Pt states that she first noticed "pimple-like" lesions on her arms that began to itch. Then she experienced spread to her legs and other parts of her body, and really started to "break out" around Monday. She states that the itchiest parts of her body are her legs, around her panty line, around her vagina, and especially in her gluteal fold. She also reports itching on her hands and wrists, although she hasn't noticed a rash in this area. Additionally, she has had itching in her lower back and eyebrows, but not her armpits. She has tried spreading fresh avocado on her skin, which provided her with 3-4 hrs of relief. Olive oil and lotion have not helped. She states application of some old nystatin cream she had supplied instant relief after application, although she ran out after one application. She notices that heat and clothing worsens the itchiness. Pt denies having any issue like this in the past.  Pt has tried no new clothes, jewelery, soaps or detergents. She has no woods exposure and no exposure to new foods. She has a hx of recent travel to [place] where she shared a room with other people at a conference this past Thurs, Fri, and Sat. Pt claims that they were coughing but she did not notice them itching. The last time she was ill was approx one month ago when she was "hacking."  Recent medications changes include ibuprofen and omeprazole. She started both of these medications the Wednesday prior to onset of her symptoms. She stopped taking ibuprofen on Thursday, citing her hx of not reacting well to pain meds. Pt endorses taking advil before without any skin reaction or similar symptoms. She stopped taking omeprazole yesterday, as well as her blood pressure medication due to nurses instructions  when she called for an appt.   Pt lives at home by herself. No pets. She has only shared her bed with her 27 yo granddaughter who she notes "is always itching."  Review of Systems  Constitutional: Positive for chills. Negative for fever.       Chills this AM that lasted for approx 1 min - better after covering up and with time.  Eyes: Negative for visual disturbance.  Respiratory: Negative for shortness of breath.   Cardiovascular: Negative for chest pain.  Gastrointestinal: Positive for abdominal pain.       Epigastric abdominal pain that is relieved with medications for acid reflux  Neurological: Negative for headaches.  Rest of ROS as per HPI     Objective:   Physical Exam  Constitutional: She is oriented to person, place, and time. She appears well-developed and well-nourished. No distress.  HENT:  Head: Normocephalic and atraumatic.  Eyes: Conjunctivae and EOM are normal.  Neck: Normal range of motion.  Cardiovascular: Normal rate and regular rhythm.   2+ radial pulses bilaterally  Pulmonary/Chest: Effort normal. No respiratory distress.  Musculoskeletal: Normal range of motion.  Neurological: She is alert and oriented to person, place, and time.  No focal deficits  Skin: Skin is warm and dry. She is not diaphoretic.  Slightly erythematous papular lesions present on anterior thighs and lower back. One lesion on inner thigh appeared superficially excoriated and well-healed. No lesions noted on hands, feet (including webbed regions),  armpit, or inside gluteal folds. No burrows noted. Hypopigmented speckles throughout legs - pt states these are freckles at baseline.    Filed Vitals:   09/06/13 1513 09/06/13 1641  BP: 175/92 170/99  Pulse: 85   Temp: 98 F (36.7 C)   TempSrc: Oral   Height: 5' 8.5" (1.74 m)   Weight: 320 lb 3.2 oz (145.242 kg)   SpO2: 99%      sitting      Repeat sitting      Assessment:     This is a well-appearing 57 yo F w/ hx of joint pains,  possible RA, and GERD experiencing intense itching that is suspicious for scabies versus drug morbilliform exanthem and high blood pressure due to missed medication, being managed as an outpatient.   For full assessment please see prob-oriented charting    Plan:     For plan, please see prob-oriented charting

## 2013-09-06 NOTE — Assessment & Plan Note (Addendum)
Intense itching described by pt in groin, buttocks, and hand regions are typical for scabies. Other possibility includes reaction to omeprazole (see GERD for further assessment/plan), although rash not diffuse or coalescing and time frame of onset is off.  1. Permethrin 5% cream to be applied to whole body, neck to toes for at least 8 hrs. 2. Advised pt to wash all linens and clothes in HOT water prior to future use. 3. F/u in 2 weeks with PCP, Dr. Dalphine Handing, to ensure resolution of this issue. Advised pt that she may continue to experience itching for up to 2 wks after treatment. Gave handout on scabies.  Update by Attending - -   Agree with plan.  Concern for scabies high given risk factors and description of her itching.  The lesions are not classical, but her skin pigmentation also makes it a little more difficult to assess.  Will treat with permethrin as described and re-evaluate closely.

## 2013-09-06 NOTE — Assessment & Plan Note (Addendum)
Possibility that rash and itching may be caused by new use of omeprazole, although her onset of symptoms is slightly outside typical window of onset with a morbilliform exanthem from a drug (typically 6-24 hrs onset with peak @ 48-72 hrs). Additionally, rash is not confluent and no desquamation noted on exam.  1. For now stop omeprazole and switch to Nexium 40 mg PO qday. Pt had relief from this medication in past although it seems to have caused some cramping and abdominal pain in the past (see note from 08/07/13) 2. F/u in 2 wks w/ PCP, Dr. Dalphine Handing for further management of GERD.  Update from Attending - -   Unclear if omeprazole may be culprit for acute rash/itching.  Patient has already stopped the medication and would like to be changed to something different.  Will start nexium in the interim and have her follow up with her PCP for further discussion re: control of her GERD.

## 2013-09-06 NOTE — Assessment & Plan Note (Addendum)
High today in 170s/90s. Pt did not take blood pressure medication today under direction of nurse over the phone when she called about her rash. 1. Advised pt to continue BP meds today. 2. F/u in 2 wks w/ PCP, Dr. Dalphine Handing, to check BP control with current medications.  Update from attending - -   BP's have been somewhat better controlled in the past, no BP meds this AM.  No changes today and advised patient to take her medications.

## 2013-09-06 NOTE — Progress Notes (Signed)
  Subjective:    Patient ID: [name], female    DOB: [birth year], 57 y.o.   MRN: [id number]  CC: Itching and rash  HPI  [name] is a 57yo woman with PMH of joint pain and GERD who presents for pruritis and rash for 6 days.  She notes that the rash first began as a pimple like lesion which itched, these were on her arms.  The lesions spread to her legs, particularly her upper thighs.  The pruritis is concentrated around her legs, panty line and gluteal fold with further areas on her hands, wrists, lower back and eyebrows.  She has not noted any linear lesions on her hands or feet.  She has tried avacado mash with some relief, however, olive oil and lotion did not help.  Aggravating factors include hot showers and wearing clothes.  She tried some old nystatin cream which helped immediately.  She has no new soaps or detergents.  She recently ate eggs and salmon but has never had a reaction to these before.  She recently has started taking omeprazole, which could be causative as the reaction started days after starting the medication. She stopped taking this medication the day prior to the visit.   Also, interestingly, she recently traveled to a conference, stayed in a hotel room with people she did not know well.  The rash and itching seems to start after this conference.    Patient lives at home by herself.  No pets.  She has recently shared a bed with her 52 yo granddaugher who is "always" itching per report.   Review of Systems  Constitutional: Positive for chills (X 1, not repeated). Negative for fever and fatigue.  Respiratory: Negative for cough and shortness of breath.   Cardiovascular: Negative for chest pain and leg swelling.  Gastrointestinal: Positive for abdominal pain (epigastric, related to GERD). Negative for nausea and vomiting.  Genitourinary: Negative for dysuria and difficulty urinating.  Skin: Positive for rash. Negative for color change and pallor.       Itching        Objective:   Physical Exam  Constitutional: She is oriented to person, place, and time. She appears well-developed and well-nourished. No distress.  HENT:  Head: Normocephalic and atraumatic.  Eyes: Conjunctivae are normal. No scleral icterus.  Cardiovascular: Normal rate, regular rhythm and normal heart sounds.   No murmur heard. Pulmonary/Chest: Effort normal and breath sounds normal. No respiratory distress.  Abdominal: Soft. Bowel sounds are normal.  Neurological: She is alert and oriented to person, place, and time.  Skin: Skin is warm and dry. Rash (Small papules, in different stages of healing on the thighs bilaterally.  At other areas of itching there are no obvious lesions, excoriations or tracks.  There were no lesions/tracts in the interweb spaces of the hands or feet. \) noted. No erythema. No pallor.  Psychiatric: She has a normal mood and affect. Her behavior is normal.   Filed Vitals:   09/06/13 1641  BP: 170/99  Pulse:   Temp:        Assessment & Plan:  Permethrin X 1 Change to Nexium Follow up in 2 weeks.

## 2013-09-19 ENCOUNTER — Ambulatory Visit (INDEPENDENT_AMBULATORY_CARE_PROVIDER_SITE_OTHER): Payer: Medicare Other | Admitting: Internal Medicine

## 2013-09-19 ENCOUNTER — Telehealth: Payer: Self-pay | Admitting: *Deleted

## 2013-09-19 ENCOUNTER — Encounter: Payer: Self-pay | Admitting: Internal Medicine

## 2013-09-19 VITALS — BP 153/94 | HR 90 | Temp 97.6°F | Ht 68.0 in | Wt 316.0 lb

## 2013-09-19 DIAGNOSIS — L282 Other prurigo: Secondary | ICD-10-CM

## 2013-09-19 MED ORDER — HYDROXYZINE HCL 10 MG PO TABS: 10.0000 mg | ORAL_TABLET | Freq: Three times a day (TID) | ORAL | Status: DC | PRN

## 2013-09-19 MED ORDER — DIPHENHYDRAMINE HCL 25 MG PO CAPS: 25.0000 mg | ORAL_CAPSULE | ORAL | Status: DC | PRN

## 2013-09-19 MED ORDER — TRIAMCINOLONE 0.1 % CREAM:EUCERIN CREAM 1:1: 1.0000 "application " | TOPICAL_CREAM | Freq: Three times a day (TID) | CUTANEOUS | Status: DC

## 2013-09-19 NOTE — Telephone Encounter (Signed)
Call from pt - states she has tried the medication,Acticin, given to her on 10/16, and now it seems her rash is worse and "itching like crazy".  Not sure if it's a reaction to the med or not. Appt scheduled today and will be seen by Dr Sherrine Maples. Debbie,Rn informed.

## 2013-09-19 NOTE — Progress Notes (Signed)
Patient ID: [name], female   DOB: 07/03/1956, 56 y.o.   MRN: [id number]  Subjective:   Patient ID: [name] female   DOB: September 29, 1956 57 y.o.   MRN: [id number]  HPI: [name] is a 57 y.o. F w/ PMH joint pain and GERD who presents to the clinic for an acute visit 2/2 to itching.  She was seen on 10/24 for pruritis 2/2 to a rash, that was thought possibly 2/2 to scabies, and she was treated with Permethrin. Unfortunately her sx have worsened, and the rash has worsened at her gluteal folds and is very pruritic. She also c/o of anal itching. She denies any recent medication changes. She denies and recent fevers or chills, or sick contacts. She denies any GI or urinary symptoms.  Just as the pt was leaving the clinic, she endorsed to me that she has had a similar rash in the past that was only located on her chest, which consisted of a few scattered spots. She was prescribed some sort of ointment at that time, which she states immediately resolved the itching and the rash resolved a few days after using.    Past Medical History  Diagnosis Date  . Seasonal allergies   . Asthma     History of  . Hypertension   . Iron deficiency 12-07-2011  . Anemia   . GERD (gastroesophageal reflux disease)   . Heart murmur   . Osteoporosis   . PONV (postoperative nausea and vomiting)   . Dysrhythmia     occ palpitations   . Sleep apnea     no CPAP machine , borderine   . Headache(784.0)     occasional   . Osteoarthritis     osteoarthritis   . Morbid obesity   . Acute urinary retention s/p Foley 01/07/2012  . Hemorrhoids, internal, with bleeding & prolapse 12/13/2011  . Myocardial infarction     unsure when; per cardiologist report  . Chronic headaches    Current Outpatient Prescriptions  Medication Sig Dispense Refill  . acetaminophen (TYLENOL) 500 MG tablet Take 500 mg by mouth every 6 (six) hours as needed for pain.      Marland Kitchen albuterol (PROVENTIL HFA;VENTOLIN HFA) 108 (90 BASE)  MCG/ACT inhaler Inhale 2 puffs into the lungs every 6 (six) hours as needed. Wheezing      . alum & mag hydroxide-simeth (MYLANTA DOUBLE-STRENGTH) 400-400-40 MG/5ML suspension Take 10 mLs by mouth every 6 (six) hours as needed (for cramping or bloating).  355 mL  0  . Casanthranol-Docusate Sodium (STOOL SOFTENER PLUS PO) Take 2 tablets by mouth daily.      . cycloSPORINE (RESTASIS) 0.05 % ophthalmic emulsion Place 1 drop into both eyes 2 (two) times daily.      . diclofenac sodium (VOLTAREN) 1 % GEL Apply 2 g topically 2 (two) times daily as needed. TOPICAL TO KNEE, SHOULDER, WRIST AND ANKLE FOR PAIN  1 Tube  0  . esomeprazole (NEXIUM) 40 MG capsule Take 1 capsule (40 mg total) by mouth daily.  30 capsule  1  . Fluocinolone Acetonide 0.01 % OIL       . fluticasone (VERAMYST) 27.5 MCG/SPRAY nasal spray Place 2 sprays into the nose daily.      Marland Kitchen ibuprofen (ADVIL,MOTRIN) 400 MG tablet Take 2 tablets (800 mg total) by mouth every 8 (eight) hours as needed for pain.  30 tablet  0  . Iron-Vitamin C 100-250 MG TABS Take 1 tablet by mouth daily.  60 each  2  . Loratadine 10 MG CAPS Take 1 capsule (10 mg total) by mouth daily.  15 each  1  . medroxyPROGESTERone (PROVERA) 10 MG tablet Take 2 tablets (20 mg total) by mouth at bedtime.  60 tablet  3  . Multiple Vitamin (MULITIVITAMIN WITH MINERALS) TABS Take 1 tablet by mouth 3 (three) times a week.       Marland Kitchen omeprazole (PRILOSEC) 20 MG capsule Take 1 capsule (20 mg total) by mouth 2 (two) times daily.  60 capsule  1  . permethrin (ACTICIN) 5 % cream Apply topically once. Apply to entire body from neck to toes. Leave on for AT LEAST 8 hours before washing off.  60 g  0  . Polyethyl Glycol-Propyl Glycol (SYSTANE FREE OP) Place 1 drop into both eyes daily.      . valsartan (DIOVAN) 160 MG tablet Take 1 tablet (160 mg total) by mouth daily.  120 tablet  3   No current facility-administered medications for this visit.   Family History  Problem Relation Age of  Onset  . Colon cancer Neg Hx   . Diabetes Mother   . Hypertension Mother   . Heart disease Sister     Congestive heart failure  . Diabetes type II Sister   . Kidney disease Brother    History   Social History  . Marital Status: Single    Spouse Name: N/A    Number of Children: 2  . Years of Education: N/A   Occupational History  . Retired    Social History Main Topics  . Smoking status: Never Smoker   . Smokeless tobacco: Never Used  . Alcohol Use: No  . Drug Use: No  . Sexual Activity: Not Currently   Other Topics Concern  . None   Social History Narrative  . None   Review of Systems: A 12 point ROS was performed; pertinent positives and negatives were noted in the HPI   Objective:  Physical Exam: Filed Vitals:   09/19/13 1443  BP: 153/94  Pulse: 90  Temp: 97.6 F (36.4 C)  TempSrc: Oral  Height: 5\' 8"  (1.727 m)  Weight: 316 lb (143.337 kg)  SpO2: 99%   Constitutional: Vital signs reviewed.  Patient is a well-developed and well-nourished female in no acute distress and cooperative with exam.   Cardiovascular: RRR, no MRG Pulmonary/Chest: normal respiratory effort, CTAB, no wheezes, rales, or rhonchi Abdominal: Non-distended Musculoskeletal: Trace LLE edema at the ankles, from her sock Neurological: A&O x3, nonfocal Skin: Diffuse flat plaques, measuring 0.5-1.5cm in diameter across her body, primarily in the groin and gluteal folds, lower back, bilateral arms, with a few scattered plaques on her upper back and chest. None appreciated on her lower legs. Multiple excoriations noted on the skin. Psychiatric: Normal mood and affect.   Assessment & Plan:   Please refer to Problem List based Assessment and Plan

## 2013-09-19 NOTE — Patient Instructions (Addendum)
**   Use teh steroid cream 3 times a day, applying to the areas that itch and those with the plaques. Take Atarax 10mg  tablets 3 times a day as needed for itching, and benadryl 25mg  4 times a day as needed for itching. These should improve your symptoms. If things do not improve, please call the clinic. Otherwise follow up in 2 weeks.   **The benadryl and Atarax can make you very drowsy, so do not take if you have to drive.

## 2013-09-19 NOTE — Assessment & Plan Note (Addendum)
Possible reaction resulting in the use diffuse scattered plaques on her body, especially at the groin, gluteal folds, and bilateral upper extremities. Per patient, she has had a similar instance of these plaques were located on her chest only that was previously treated very effectively with some sort of ointment/cream. Will start Atarax, Benadryl, and triamcinolone cream and see how this does for her. She is to call the clinic if her symptoms do not improve over the next few days. She's to return to the clinic in one to 2 weeks for a followup. - triamcinolone 0.1% cream apply to affected areas 3 times a day - Atarax 10 mg 1 tablet 3 times a day when necessary itching, she was made aware of the sedating effects - Will refer to Dermatology - Benadryl 25 mg one tablet every 4 hours when necessary for itching, she was made aware of the sedating affects

## 2013-09-21 NOTE — Progress Notes (Signed)
I saw and evaluated the patient.  I personally confirmed the key portions of the history and exam documented by Dr. Sherrine Maples and I reviewed pertinent patient test results.  The assessment, diagnosis, and plan were formulated together and I agree with the documentation in the resident's note.  The patient has been prescribed both Atarax and Benadryl. I would advise taking one of these since both are first generation antihistaminics and might have higher side effects in combination. I am copying this note to Dr. Sherrine Maples for follow up.   Thanks.

## 2013-09-21 NOTE — Addendum Note (Signed)
Addended by: Charlsie Merles F on: 09/21/2013 10:36 AM   Modules accepted: Orders

## 2013-09-25 ENCOUNTER — Ambulatory Visit (INDEPENDENT_AMBULATORY_CARE_PROVIDER_SITE_OTHER): Payer: Medicare Other | Admitting: Internal Medicine

## 2013-09-25 ENCOUNTER — Encounter: Payer: Self-pay | Admitting: Internal Medicine

## 2013-09-25 VITALS — BP 177/106 | HR 64 | Temp 97.5°F | Ht 68.0 in | Wt 320.7 lb

## 2013-09-25 DIAGNOSIS — I10 Essential (primary) hypertension: Secondary | ICD-10-CM

## 2013-09-25 DIAGNOSIS — J45909 Unspecified asthma, uncomplicated: Secondary | ICD-10-CM

## 2013-09-25 DIAGNOSIS — L282 Other prurigo: Secondary | ICD-10-CM

## 2013-09-25 DIAGNOSIS — E785 Hyperlipidemia, unspecified: Secondary | ICD-10-CM

## 2013-09-25 DIAGNOSIS — K219 Gastro-esophageal reflux disease without esophagitis: Secondary | ICD-10-CM

## 2013-09-25 DIAGNOSIS — G8929 Other chronic pain: Secondary | ICD-10-CM

## 2013-09-25 DIAGNOSIS — K5900 Constipation, unspecified: Secondary | ICD-10-CM

## 2013-09-25 MED ORDER — ALUM & MAG HYDROXIDE-SIMETH 400-400-40 MG/5ML PO SUSP: 10.0000 mL | Freq: Four times a day (QID) | ORAL | Status: DC | PRN

## 2013-09-25 MED ORDER — SENNOSIDES-DOCUSATE SODIUM 8.6-50 MG PO TABS: 1.0000 | ORAL_TABLET | Freq: Every day | ORAL | Status: DC

## 2013-09-25 MED ORDER — ALBUTEROL SULFATE HFA 108 (90 BASE) MCG/ACT IN AERS: 2.0000 | INHALATION_SPRAY | Freq: Four times a day (QID) | RESPIRATORY_TRACT | Status: DC | PRN

## 2013-09-25 MED ORDER — CALCIUM CITRATE-VITAMIN D 250-200 MG-UNIT PO TABS: 2.0000 | ORAL_TABLET | Freq: Every day | ORAL | Status: DC

## 2013-09-25 MED ORDER — HYDROCHLOROTHIAZIDE 12.5 MG PO TABS: 12.5000 mg | ORAL_TABLET | Freq: Every day | ORAL | Status: DC

## 2013-09-25 NOTE — Assessment & Plan Note (Addendum)
Assessment:   Feels wheezing intermittently since fall started. No shortness of breath. Likely seasonal exacerbation of baseline asthma.   Plan:   Refill on albuterol inhaler given.   Asked the patient to call back if symptoms worsen.

## 2013-09-25 NOTE — Assessment & Plan Note (Signed)
Assessment:   Still suffers from constipation off and on. Drinks smoothies for relief. Would like to have refills on Docusates and Myelenta.   Plan:   Refills given.   Advised adequate hydration.

## 2013-09-25 NOTE — Assessment & Plan Note (Signed)
Assessment:  The patient is doing well on Nexium and has minimal symptoms of GERD at this point. She does not take prevacid which is in her medication list.   Plan:   Continue taking Nexium.   Prevacid removed from medication list.

## 2013-09-25 NOTE — Assessment & Plan Note (Signed)
Assessment: Patient getting better. The rash which was initially plaque like is slowly healing to become more macular and scaly. Itching is better.  Plan:   Continue Kenalog cream and Atarax tablets for itching.  Patient wishes to keep the dermatology referral and would like to visit in case the rash does not heal completely.

## 2013-09-25 NOTE — Patient Instructions (Addendum)
Ms [name], [name] to hear that the rash is healing. Please keep the same treatment.  We are starting Vitamin D and calcium tablets today. Please do not take them at the same time as your Nexium.  We are starting HCTZ again for your blood pressure. It is running high. Please take 12.5 mg daily.  Next visit in 1 month.   BP Readings from Last 3 Encounters:  09/25/13 178/101  09/19/13 153/94  09/06/13 170/99    Thanks, Aletta Edouard MD MPH 09/25/2013 9:40 AM

## 2013-09-25 NOTE — Assessment & Plan Note (Addendum)
Assessment:  Blood pressure control deteriorated since the past few visits.  BP Readings from Last 3 Encounters:  09/25/13 177/106  09/19/13 153/94  09/06/13 170/99    Plan:   Start HCTZ 12.5 mg daily on top of Diovan 160 mg that the patient is taking.  Reassess in 1 month. The patient will make appointment with resident clinic as my calendar is full for 1 month. Patient understands.   BMP next visit after starting HCTZ.

## 2013-09-25 NOTE — Progress Notes (Signed)
Subjective:     Patient ID: [name], female   DOB: 1956/09/18, 57 y.o.   MRN: [id number]  HPI Ms Pytel follows up on body rash, insomnia, hypertension and asthma. She says she is doing much better and the rash is recovering with the kenalog cream that we prescribed a week ago and she has 1 more refill. Itching is much better and she is using atarax for it (not benadryl). She is sleeping better because the itching was keeping her up at nights. She complains of some wheezes intermittently during the past few weeks but denies any shortness of breath or fatigue. She denies any chest pain, palpitations or trouble lying down. She denies any swelling in her legs. She endorses constipation and would like to get a refill on her constipation medications. The patient has high blood pressure today but denies headaches, weakness, visual disturbance, seizures, syncope or speech problems.   She has  has a past medical history of Seasonal allergies; Asthma; Hypertension; Iron deficiency (12-07-2011); Anemia; GERD (gastroesophageal reflux disease); Heart murmur; Osteoporosis; PONV (postoperative nausea and vomiting); Dysrhythmia; Sleep apnea; Headache(784.0); Osteoarthritis; Morbid obesity; Acute urinary retention s/p Foley (01/07/2012); Hemorrhoids, internal, with bleeding & prolapse (12/13/2011); Myocardial infarction; and Chronic headaches.   Review of Systems  Constitutional: Negative for fever, chills and fatigue.  HENT: Negative for ear discharge, hearing loss, nosebleeds, postnasal drip, rhinorrhea, sinus pressure, sore throat, tinnitus and trouble swallowing.   Eyes: Negative for redness and visual disturbance.  Respiratory: Positive for wheezing. Negative for cough, choking, chest tightness and shortness of breath.   Cardiovascular: Negative for chest pain, palpitations and leg swelling.  Gastrointestinal: Positive for constipation. Negative for nausea, vomiting and abdominal pain.  Genitourinary:  Negative for vaginal bleeding.  Skin: Positive for rash (Improving). Negative for wound.  Neurological: Negative for dizziness, seizures, syncope, light-headedness and headaches.       Objective:   Physical Exam  Constitutional:  Obese, african american  Eyes: Conjunctivae are normal. Pupils are equal, round, and reactive to light.  Neck: Normal range of motion. Neck supple.  Cardiovascular: Normal rate and regular rhythm.   No murmur heard. Pulmonary/Chest: Effort normal and breath sounds normal. She has no wheezes.  Abdominal: Soft. She exhibits no distension.  Lymphadenopathy:    She has no cervical adenopathy.  Skin: Skin is warm.  Macular brown rash present on wrists, arms, thighs, buttocks, as before but less florid in appearance.          Assessment & Plan:     Please see problem based charting.

## 2013-09-25 NOTE — Assessment & Plan Note (Signed)
Assessment  Patient's last lipid values are as follows. Goal LDL for this patient is 100 given hypertension, questionable history of reported MI per problem list.    Lab Results  Component Value Date   CHOL 194 11/28/2012   HDL 48 11/28/2012   LDLCALC 846* 11/28/2012   TRIG 105 11/28/2012   CHOLHDL 4.0 11/28/2012   Plan:   Unsure why the patient is not on statin. Will need to clarify this on next visit. I think with the patient's acute problems every visit, this aspect was neglected.   We will check Lipid profile next visit and assess starting a statin.

## 2013-10-04 ENCOUNTER — Telehealth: Payer: Self-pay | Admitting: *Deleted

## 2013-10-04 NOTE — Telephone Encounter (Signed)
Call from the pharmacy -stating they had received the rx for Triamcin cream on 10/29; wants to be sure not to dupilcate rx.

## 2013-10-04 NOTE — Telephone Encounter (Signed)
Pt called about Triamcinolone Cream 0.1% - rx was refilled on 10/29 but pt stated [facility] did not received it. [facility]; pt informed.

## 2013-10-12 ENCOUNTER — Telehealth: Payer: Self-pay | Admitting: *Deleted

## 2013-10-12 NOTE — Telephone Encounter (Signed)
Pt said she was refused at the referral appt for her rash due to insurance. She will speak w/ doctor monday

## 2013-10-15 ENCOUNTER — Encounter: Payer: Self-pay | Admitting: Internal Medicine

## 2013-10-15 ENCOUNTER — Ambulatory Visit (INDEPENDENT_AMBULATORY_CARE_PROVIDER_SITE_OTHER): Payer: Medicare Other | Admitting: Internal Medicine

## 2013-10-15 VITALS — BP 151/83 | HR 86 | Temp 98.6°F | Ht 68.0 in | Wt 318.0 lb

## 2013-10-15 DIAGNOSIS — I10 Essential (primary) hypertension: Secondary | ICD-10-CM

## 2013-10-15 DIAGNOSIS — L282 Other prurigo: Secondary | ICD-10-CM

## 2013-10-15 DIAGNOSIS — E785 Hyperlipidemia, unspecified: Secondary | ICD-10-CM

## 2013-10-15 LAB — BASIC METABOLIC PANEL WITH GFR
BUN: 13 mg/dL (ref 6–23)
CO2: 32 mEq/L (ref 19–32)
Chloride: 101 mEq/L (ref 96–112)
Creat: 0.8 mg/dL (ref 0.50–1.10)
GFR, Est African American: >89 mL/min
GFR, Est Non African American: 81 mL/min

## 2013-10-15 LAB — LIPID PANEL
Total CHOL/HDL Ratio: 3.9 Ratio
VLDL: 22 mg/dL (ref 0–40)

## 2013-10-15 NOTE — Progress Notes (Signed)
Patient ID: [name], female   DOB: 09-07-1956, 57 y.o.   MRN: [id number] HPI The patient is a 57 y.o. female with a history of HTN, asthma, GERD, HLD and most recently a pruritic rash for which she has been evaluated three times in our clinic. Patient presents today for persistent rash to her anterior and posterior thighs, forearms, upper arms, lower back and buttocks that she has had since the beginning of October.  Patient has been evaluated in our clinic multiple times for this same rash. Patient reports that the rash begins as small red bumps, then the bump progresses and flattens into a larger circle that persists for weeks. These areas are extremely pruritic and bothersome to patient especially at night. Patient has been using Atarax 3 times a day as well as Kenalog cream twice a day for the past month. She is also using over-the-counter Keri lotion and skin oil 4-5 times per day with some relief of her symptoms. She reports that her skin "burns" when she showers if she does not use these moisturizers. She thinks that her rash has somewhat improved since its onset, however she has had new lesions arise on her forearms over the past week. She has eliminated eggs and peanut butter from her diet to see if this will help. She was referred to dermatology, but her referral was rejected for "an insurance reason."  She is desperate for improvement of this rash.   Patient also has HTN and had been managed on Diovan 160mg  until her last visit when HCTZ 12.5mg  daily was added to her regimen. She has been compliant with both medications and has been doing well with no reported side effects. BP today 151/83 (she did not take the HCTZ or Diovan today yet), which is down from her last visit when her BP was 177/106.  ROS: General: no fevers, chills, changes in weight, changes in appetite Skin: see HPI HEENT: no blurry vision, hearing changes, sore throat Pulm: no dyspnea, coughing, wheezing CV: no chest  pain, palpitations, shortness of breath Abd: no abdominal pain, nausea/vomiting, diarrhea/constipation GU: no dysuria, hematuria, polyuria Ext: no arthralgias, myalgias Neuro: no weakness, numbness, or tingling  Filed Vitals:   10/15/13 1403  BP: 151/83  Pulse: 86  Temp: 98.6 F (37 C)   Physical Exam General: alert, cooperative, and in no apparent distress HEENT: pupils equal round and reactive to light, vision grossly intact, oropharynx clear and non-erythematous  Neck: supple Lungs: clear to ascultation bilaterally, normal work of respiration, no wheezes, rales, ronchi Heart: regular rate and rhythm, no murmurs, gallops, or rubs Abdomen: soft, non-tender, non-distended, normal bowel sounds Extremities: warm extremities bilaterally, no BLE edema Skin: there are mildly atrophic,hyperpigmented macules and patches w/ some interspersed excoriated papules to her inner upper arms, anterior forearms, low back and top of b/l buttocks, anterior and posterior thighs, b/l popliteal fossae; no drainage Neurologic: alert & oriented X3, cranial nerves II-XII grossly intact, strength grossly intact, sensation intact to light touch  Current Outpatient Prescriptions on File Prior to Visit  Medication Sig Dispense Refill  . albuterol (PROVENTIL HFA;VENTOLIN HFA) 108 (90 BASE) MCG/ACT inhaler Inhale 2 puffs into the lungs every 6 (six) hours as needed. Wheezing  3.7 g  5  . alum & mag hydroxide-simeth (MYLANTA DOUBLE-STRENGTH) 400-400-40 MG/5ML suspension Take 10 mLs by mouth every 6 (six) hours as needed (for cramping or bloating).  355 mL  0  . Calcium Citrate-Vitamin D (CITRACAL/VITAMIN D) 250-200 MG-UNIT TABS Take 2  tablets by mouth daily.  90 each  4  . cycloSPORINE (RESTASIS) 0.05 % ophthalmic emulsion Place 1 drop into both eyes 2 (two) times daily.      . diclofenac sodium (VOLTAREN) 1 % GEL Apply 2 g topically 2 (two) times daily as needed. TOPICAL TO KNEE, SHOULDER, WRIST AND ANKLE FOR PAIN  1  Tube  0  . esomeprazole (NEXIUM) 40 MG capsule Take 1 capsule (40 mg total) by mouth daily.  30 capsule  1  . Fluocinolone Acetonide 0.01 % OIL       . fluticasone (VERAMYST) 27.5 MCG/SPRAY nasal spray Place 2 sprays into the nose daily.      . hydrochlorothiazide (HYDRODIURIL) 12.5 MG tablet Take 1 tablet (12.5 mg total) by mouth daily.  30 tablet  6  . hydrOXYzine (ATARAX/VISTARIL) 10 MG tablet Take 1 tablet (10 mg total) by mouth 3 (three) times daily as needed for itching.  60 tablet  0  . Iron-Vitamin C 100-250 MG TABS Take 1 tablet by mouth daily.  60 each  2  . Multiple Vitamin (MULITIVITAMIN WITH MINERALS) TABS Take 1 tablet by mouth 3 (three) times a week.       Bertram Gala Glycol-Propyl Glycol (SYSTANE FREE OP) Place 1 drop into both eyes daily.      Marland Kitchen senna-docusate (SENOKOT-S) 8.6-50 MG per tablet Take 1 tablet by mouth daily.  30 tablet  5  . Triamcinolone Acetonide (TRIAMCINOLONE 0.1 % CREAM : EUCERIN) CREA Apply 1 application topically 3 (three) times daily.  1 each  1  . valsartan (DIOVAN) 160 MG tablet Take 1 tablet (160 mg total) by mouth daily.  120 tablet  3  . diphenhydrAMINE (BENADRYL) 25 mg capsule Take 1 capsule (25 mg total) by mouth every 4 (four) hours as needed for itching.  24 capsule  2   No current facility-administered medications on file prior to visit.    Assessment/Plan

## 2013-10-15 NOTE — Telephone Encounter (Signed)
On the last visit, the patient's rash was getting better, however the patient elected to keep the derm appt anyways.

## 2013-10-15 NOTE — Patient Instructions (Addendum)
Thank you for your visit.  Today we checked your cholesterol and you electrolytes/kidney function.  We referred you to dermatology again for your rash.   You can continue using Atarax and the kenalog cream. You can also continue moisturizing your skin with OTC lotion and oil.  Please follow up with Dr. Dalphine Handing as previously scheduled next month.

## 2013-10-16 NOTE — Assessment & Plan Note (Addendum)
Checked lipid panel today. Will consider starting a statin as per PCP (patient did not want to start this medication today as she is overwhelmed by her rash at this point).  ADDENDUM: LDL 123. Will want to discuss addition of statin to patient's medication regimen at her next visit.

## 2013-10-16 NOTE — Assessment & Plan Note (Addendum)
Blood pressure improved with the addition of HCTZ. Continue current regimen. -Check BMP today   ADDENDUM: BMP wnl.

## 2013-10-16 NOTE — Assessment & Plan Note (Signed)
Unclear etiology. The lesions are slightly atrophic and look somewhat like striae, though are circular in shape, which does not lead me toward this diagnosis. The rash is also more diffuse than I would expect for striae. The hyperpigmentation as opposed to erythema leads me to believe that the rash is beginning to burn out. This rash definitely does not look like scabies, which I know was considered in the past. This could be extensive tinea corporis, though it is more diffuse and more pruritic than I would expect. I would also expect more erythema for tinea corporis. I believe she needs a skin biopsy at this point.  -Dermatology referral- we called office to verify that patient can in fact be seen -continue lotions and oil -continue Kenalog cream and atarax prn

## 2013-10-29 NOTE — Addendum Note (Signed)
Addended by: Neomia Dear on: 10/29/2013 04:49 PM   Modules accepted: Orders

## 2013-11-06 ENCOUNTER — Encounter: Payer: Self-pay | Admitting: Internal Medicine

## 2013-11-06 ENCOUNTER — Ambulatory Visit (INDEPENDENT_AMBULATORY_CARE_PROVIDER_SITE_OTHER): Payer: Medicare Other | Admitting: Internal Medicine

## 2013-11-06 VITALS — BP 177/103 | HR 69 | Temp 98.5°F | Ht 68.5 in | Wt 320.6 lb

## 2013-11-06 DIAGNOSIS — E785 Hyperlipidemia, unspecified: Secondary | ICD-10-CM

## 2013-11-06 DIAGNOSIS — S025XXA Fracture of tooth (traumatic), initial encounter for closed fracture: Secondary | ICD-10-CM

## 2013-11-06 DIAGNOSIS — I10 Essential (primary) hypertension: Secondary | ICD-10-CM

## 2013-11-06 DIAGNOSIS — L282 Other prurigo: Secondary | ICD-10-CM

## 2013-11-06 DIAGNOSIS — N8502 Endometrial intraepithelial neoplasia [EIN]: Secondary | ICD-10-CM

## 2013-11-06 DIAGNOSIS — S025XXB Fracture of tooth (traumatic), initial encounter for open fracture: Secondary | ICD-10-CM

## 2013-11-06 MED ORDER — HYDROXYZINE HCL 10 MG PO TABS: 10.0000 mg | ORAL_TABLET | Freq: Three times a day (TID) | ORAL | Status: DC | PRN

## 2013-11-06 MED ORDER — TRIAMCINOLONE ACETONIDE 0.1 % EX CREA: TOPICAL_CREAM | CUTANEOUS | Status: DC

## 2013-11-06 NOTE — Progress Notes (Signed)
Subjective:     Patient ID: [name], female   DOB: 06/28/1956, 57 y.o.   MRN: [id number]  HPI Ms Herst follows up for skin rash, hypertension, vaginal bleeding and hyperlipidemia.   Skin Rash: Still has the rash, saw a dermatologist at [facility], who basically said that the treatment that she is taking is okay. She is on Keri lotion, Kenalog cream, Claritin/loratidine and hydroxyzine before bed. She stopped bananas, eggs, peanut butter, and orange juice and is adding them back to her diet one by one to find the trigger for her rash - so far she thinks eggs and peanut butter suit her.   Hypertension - Blood pressure is high and she has been taking her medications.   Vaginal bleeding - [facility] She has a follow up appointment in January. She is having vaginal bleeding again, and she is using about 2 pads a day. She knows that she can go to [facility] emergency if the bleeding got bad.  Hyperlipidemia - wants to start the cholesterol discussion for the new year.    Review of Systems  Constitutional: Negative for fever and chills.  HENT: Positive for dental problem. Negative for ear discharge, ear pain and sore throat.   Respiratory: Positive for shortness of breath. Negative for chest tightness.   Cardiovascular: Negative for chest pain and palpitations.  Gastrointestinal: Negative for diarrhea and constipation.  Genitourinary: Positive for vaginal bleeding. Negative for dysuria, frequency, hematuria, vaginal discharge, vaginal pain and pelvic pain.  Skin: Positive for rash.  Neurological: Negative for dizziness, speech difficulty, weakness and headaches.       Objective:   Physical Exam  Constitutional: She is oriented to person, place, and time.  obese  HENT:  Head: Normocephalic and atraumatic.  Mouth/Throat:    Eyes: Conjunctivae are normal. Pupils are equal, round, and reactive to light.  Cardiovascular: Normal rate  and regular rhythm.   Pulmonary/Chest: Effort normal and breath sounds normal.  Abdominal: Soft. She exhibits no distension.  Neurological: She is alert and oriented to person, place, and time.  Skin: Skin is warm. Rash (On lower back, bilateral buttocks, thighs, and arms. Macular, brown, scaly in patches. Cleared from the rest of the body.) noted.       Assessment & Plan:     Please see problem based charting.

## 2013-11-06 NOTE — Patient Instructions (Addendum)
[name],   We will given you your allergy medications again.  [facility].  Your blood pressure is up. We will go up on your HCTZ today. Please take 2 tablets of 12.5 (total 25mg ) daily. We will discuss your cholesterol medication next visit.  Thanks, Aletta Edouard MD MPH 11/06/2013 10:56 AM       Pruritus (Itch) Pruritis is an itch. There are many different problems that can cause an itch. Dry skin is one of the most common causes of itching. Most cases of itching do not require medical attention.  HOME CARE INSTRUCTIONS  Make sure your skin is moistened on a regular basis. A moisturizer that contains petroleum jelly is best for keeping moisture in your skin. If you develop a rash, you may try the following for relief:   Use corticosteroid cream.  Apply cool compresses to the affected areas.  Bathe with Epsom salts or baking soda in the bathwater.  Soak in colloidal oatmeal baths. These are available at your pharmacy.  Apply baking soda paste to the rash. Stir water into baking soda until it reaches a paste-like consistency.  Use an anti-itch lotion.  Take over-the-counter diphenhydramine medicine by mouth as the instructions direct.  Avoid scratching. Scratching may cause the rash to become infected. If itching is very bad, your caregiver may suggest prescription lotions or creams to lessen your symptoms.  Avoid hot showers, which can make itching worse. A cold shower may help with itching as long as you use a moisturizer after the shower.

## 2013-11-07 DIAGNOSIS — S025XXA Fracture of tooth (traumatic), initial encounter for closed fracture: Secondary | ICD-10-CM | POA: Insufficient documentation

## 2013-11-07 NOTE — Assessment & Plan Note (Signed)
Assessment: Recurrent vaginal bleeding. Under treatment with gynecology.   Plan:  Follow up with [facility] clinic as scheduled in January.  Educated about symptomatic anemia, or worsening of bleeding - then she will go to the ER.

## 2013-11-07 NOTE — Assessment & Plan Note (Signed)
Assessment: Blood pressure high, likely due to liberal use of antihistaminics and possibly widespread use of the steroid cream. Also, the patient admits to high stress levels with continued problems of vaginal bleeding and rash/itch.   BP Readings from Last 3 Encounters:  11/06/13 177/103  10/15/13 151/83  09/25/13 177/106    Patient is presently on diovan 160 mg and HCTZ 12.5 mg.    Plan:  We will go up on the HCTZ 12.5 to 25mg . Patient in agreement.   Reassess BP next visit.   BMP next visit.

## 2013-11-07 NOTE — Assessment & Plan Note (Signed)
Assessment: Chipped upper right first incisor. Painful to the patient and tender to touch. No sign of infection or pulpal necrosis.  Plan:  Referral to dentist.  Advised soft diet, good oral hygiene.

## 2013-11-07 NOTE — Assessment & Plan Note (Signed)
Assessment: It seems like clearing up. Visit with dermatologist done, recommended the same medications as we have prescribed.   Plan:  Refilled kenalog 1% cream (patient wanted the product without eucerin); atarax 10 mg.  We will get dermatology notes for our reference.   It is agreed that if the itching does not get better, the patient will follow up with dermatology.

## 2013-11-07 NOTE — Assessment & Plan Note (Signed)
Assessment: The patient's lipid profile is deranged.  Lab Results  Component Value Date   CHOL 195 10/15/2013   HDL 50 10/15/2013   LDLCALC 123* 10/15/2013   TRIG 110 10/15/2013   CHOLHDL 3.9 10/15/2013   She would need statin therapy, however, she is overwhelmed with the rash right now and would like to discuss this after the holidays.   Plan:  The patient will be started on a statin on the next visit, if she agrees.  Diet counseling done for this visit.

## 2013-11-13 ENCOUNTER — Telehealth: Payer: Self-pay | Admitting: Licensed Clinical Social Worker

## 2013-11-13 NOTE — Telephone Encounter (Signed)
[name] was referred to CSW as pt is in need of dental services but does not have dental insurance.  CSW placed call to [name].  Pt aware Medicare does not cover dental.  CSW discussed dental clinic in the area and will send information out for review.

## 2013-11-20 ENCOUNTER — Other Ambulatory Visit: Payer: Self-pay | Admitting: Internal Medicine

## 2013-12-10 ENCOUNTER — Ambulatory Visit: Payer: Medicare Other | Admitting: Obstetrics & Gynecology

## 2013-12-13 ENCOUNTER — Ambulatory Visit (INDEPENDENT_AMBULATORY_CARE_PROVIDER_SITE_OTHER): Payer: Medicare Other | Admitting: Internal Medicine

## 2013-12-13 ENCOUNTER — Telehealth: Payer: Self-pay | Admitting: *Deleted

## 2013-12-13 ENCOUNTER — Encounter: Payer: Self-pay | Admitting: Internal Medicine

## 2013-12-13 VITALS — BP 189/114 | HR 109 | Temp 102.0°F | Ht 68.0 in | Wt 317.1 lb

## 2013-12-13 DIAGNOSIS — J111 Influenza due to unidentified influenza virus with other respiratory manifestations: Secondary | ICD-10-CM

## 2013-12-13 DIAGNOSIS — I10 Essential (primary) hypertension: Secondary | ICD-10-CM

## 2013-12-13 MED ORDER — OSELTAMIVIR PHOSPHATE 75 MG PO CAPS: 75.0000 mg | ORAL_CAPSULE | Freq: Two times a day (BID) | ORAL | Status: AC

## 2013-12-13 MED ORDER — VALSARTAN 320 MG PO TABS: 320.0000 mg | ORAL_TABLET | Freq: Every day | ORAL | Status: DC

## 2013-12-13 NOTE — Telephone Encounter (Signed)
Agree with appt. 

## 2013-12-13 NOTE — Assessment & Plan Note (Signed)
High suspicion for influenza in this patient.  Because of her history of Asthma and BMI > 40, IDSA rec Tamiflu, even if outside of the 48h window  -Tamiflu 75mg  BID x 5d -Supportive therapy with fluids and tylenol

## 2013-12-13 NOTE — Progress Notes (Signed)
Subjective:   Patient ID: [name] female   DOB: 04-19-1956 58 y.o.   MRN: [id number]  Chief Complaint  Patient presents with  . Fever    Started 12/10/13 - getting worse. Nose burning. Occ orange productive cough. Head congestion.    HPI: TracySharley A Griffin is a 58 y.o. woman with history of OSA, Asthma, morbid obesity (BMI 48) and HTN who presents for an acute visit.  Patient called clinic today with complaints of feeling ill since Tuesday (though didn't feel quite right on Monday) with cough, sinus congestion, orange sputum production, nausea without vomiting, weakness, dizziness.  Woke up Tuesday/Wed with sore throat, head throbbing, nose running.  Febrile to 101 per home thermometer. Sick contact includes grandchild, but they improved quickly.  Felt similar a long time ago with "PNA like" symptoms that was treated with abx.  Some SOB & DOE, with minimal help with inhaler.  Chest tightness and wheezing at night. Using inhaler BID. Has also been using a nose spray BID, reports nose is on fire. +diffuse muscle aches. Feels very weak, needs to hold onto a wall to get up to go to restroom. Been drinking a lot to stay hydrated, feels very thirsty.   No flu shot this year. Doesn't smoke.   Review of Systems: Constitutional: Decreased appetite   HEENT: Denies photophobia, eye pain, redness, hearing loss, trouble swallowing, neck pain, neck stiffness and tinnitus. A little ear pain (left) Respiratory: per HPI  Cardiovascular: Denies chest pain, palpitations and leg swelling.  Gastrointestinal: Denies vomiting, abdominal pain, constipation,blood in stool and abdominal distention. Loose stools on Sunday-Monday (3 BMs that day, last week had 6-7 in a day) but this is improving Genitourinary: Denies dysuria, urgency, frequency, hematuria, flank pain and difficulty urinating. Normal - making urine 8-10x/day Musculoskeletal: Denies gait problem. Slight back pain, arthralgias. Skin: Denies  pallor, rash and wound.  Neurological: Denies seizures, syncope, numbnes    Past Medical History  Diagnosis Date  . Seasonal allergies   . Asthma     History of  . Hypertension   . Iron deficiency 12-07-2011  . Anemia   . GERD (gastroesophageal reflux disease)   . Heart murmur   . Osteoporosis   . PONV (postoperative nausea and vomiting)   . Dysrhythmia     occ palpitations   . Sleep apnea     no CPAP machine , borderine   . Headache(784.0)     occasional   . Osteoarthritis     osteoarthritis   . Morbid obesity   . Acute urinary retention s/p Foley 01/07/2012  . Hemorrhoids, internal, with bleeding & prolapse 12/13/2011  . Myocardial infarction     unsure when; per cardiologist report  . Chronic headaches    Current Outpatient Prescriptions  Medication Sig Dispense Refill  . albuterol (PROVENTIL HFA;VENTOLIN HFA) 108 (90 BASE) MCG/ACT inhaler Inhale 2 puffs into the lungs every 6 (six) hours as needed. Wheezing  3.7 g  5  . alum & mag hydroxide-simeth (MYLANTA DOUBLE-STRENGTH) 400-400-40 MG/5ML suspension Take 10 mLs by mouth every 6 (six) hours as needed (for cramping or bloating).  355 mL  0  . Calcium Citrate-Vitamin D (CITRACAL/VITAMIN D) 250-200 MG-UNIT TABS Take 2 tablets by mouth daily.  90 each  4  . cycloSPORINE (RESTASIS) 0.05 % ophthalmic emulsion Place 1 drop into both eyes 2 (two) times daily.      . diclofenac sodium (VOLTAREN) 1 % GEL Apply 2 g topically 2 (two) times  daily as needed. TOPICAL TO KNEE, SHOULDER, WRIST AND ANKLE FOR PAIN  1 Tube  0  . diphenhydrAMINE (BENADRYL) 25 mg capsule Take 1 capsule (25 mg total) by mouth every 4 (four) hours as needed for itching.  24 capsule  2  . fluticasone (VERAMYST) 27.5 MCG/SPRAY nasal spray Place 2 sprays into the nose daily.      . hydrochlorothiazide (HYDRODIURIL) 12.5 MG tablet Take 1 tablet (12.5 mg total) by mouth daily.  30 tablet  6  . hydrOXYzine (ATARAX/VISTARIL) 10 MG tablet Take 1 tablet (10 mg total) by  mouth 3 (three) times daily as needed for itching.  60 tablet  0  . Iron-Vitamin C 100-250 MG TABS Take 1 tablet by mouth daily.  60 each  2  . Multiple Vitamin (MULITIVITAMIN WITH MINERALS) TABS Take 1 tablet by mouth 3 (three) times a week.       Marland Kitchen NEXIUM 40 MG capsule TAKE ONE CAPSULE BY MOUTH EVERY DAY  30 capsule  1  . Polyethyl Glycol-Propyl Glycol (SYSTANE FREE OP) Place 1 drop into both eyes daily.      Marland Kitchen senna-docusate (SENOKOT-S) 8.6-50 MG per tablet Take 1 tablet by mouth daily.  30 tablet  5  . triamcinolone cream (KENALOG) 0.1 % Apply on affected skin 2 times a day as needed  85.2 g  1  . valsartan (DIOVAN) 160 MG tablet Take 1 tablet (160 mg total) by mouth daily.  120 tablet  3   No current facility-administered medications for this visit.   Family History  Problem Relation Age of Onset  . Colon cancer Neg Hx   . Diabetes Mother   . Hypertension Mother   . Heart disease Sister     Congestive heart failure  . Diabetes type II Sister   . Kidney disease Brother    History   Social History  . Marital Status: Single    Spouse Name: N/A    Number of Children: 2  . Years of Education: N/A   Occupational History  . Retired    Social History Main Topics  . Smoking status: Never Smoker   . Smokeless tobacco: Never Used  . Alcohol Use: No  . Drug Use: No  . Sexual Activity: Not Currently   Other Topics Concern  . Not on file   Social History Narrative  . No narrative on file    Objective:  Physical Exam: Filed Vitals:   12/13/13 1504  BP: 189/114  Pulse: 109  Temp: 102 F (38.9 C)  TempSrc: Oral  Height: 5\' 8"  (1.727 m)  Weight: 317 lb 1.6 oz (143.836 kg)  SpO2: 96%   General: appear ill but no acute distress HEENT: PERRL, EOMI, no scleral icterus, no conjunctival pallor, no cervical LAD, no pharyngeal erythema or exudate, inflamed nasal turbinates b/l, b/l TM obscured by cerumen, dry MM, minimal b/l sinus and maxillary sinus tenderness. Cardiac: RRR,  no rubs, murmurs or gallops Pulm: clear to auscultation bilaterally, moving normal volumes of air, no respiratory distress Abd: soft, nontender, nondistended, BS present Ext: warm and well perfused, trace pretibial pitting edema on the left (chronic per patient, after knee surgery) Neuro: alert and oriented X3, cranial nerves II-XII grossly intact  Assessment & Plan:  Case and care discussed with Dr. Eppie Gibson.  Please see problem oriented charting for further details.

## 2013-12-13 NOTE — Assessment & Plan Note (Addendum)
BP Readings from Last 3 Encounters:  12/13/13 189/114  11/06/13 177/103  10/15/13 151/83    Lab Results  Component Value Date   NA 140 10/15/2013   K 3.6 10/15/2013   CREATININE 0.80 10/15/2013    Assessment: Blood pressure control: moderately elevated Progress toward BP goal:  unchanged  Plan: Medications:  Increase diovan to 320mg  daily, cont HCTZ 25 Educational resources provided: brochure;handout;video Self management tools provided:   Other plans: BMET done 1 month ago

## 2013-12-13 NOTE — Telephone Encounter (Signed)
Pt calls and states she has been sick since tues, cough, congestion, sputum orange, fevers up to 101.0- digital thermometer, nausea, no vomiting, weakness, feels dizzy, grandchild was recently sick. shceduled for 1445 dr Burnard Bunting but ask pt to be to clinic by 1420 in case she could be brought back early

## 2013-12-13 NOTE — Patient Instructions (Signed)
-I believe you have the flu - I am going to treat this with tamiflu 75mg  (1 pill) twice daily for 5 days. -Regarding your high blood pressure, because it has been pretty high even in the past, I am going to increase your diovan to 320mg  (you may complete your current supply by taking 2 pills, but your new prescription will include 320mg  pills)  Please be sure to bring all of your medications with you to every visit.  Should you have any new or worsening symptoms, please be sure to call the clinic at [phone].  Influenza A (H1N1) H1N1 formerly called "swine flu" is a new influenza virus causing sickness in people. The H1N1 virus is different from seasonal influenza viruses. However, the H1N1 symptoms are similar to seasonal influenza and it is spread from person to person. You may be at higher risk for serious problems if you have underlying serious medical conditions. The CDC and the [employer] Health Organization are following reported cases around the world. CAUSES   The flu is thought to spread mainly person-to-person through coughing or sneezing of infected people.  A person may become infected by touching something with the virus on it and then touching their mouth or nose. SYMPTOMS   Fever.  Headache.  Tiredness.  Cough.  Sore throat.  Runny or stuffy nose.  Body aches.  Diarrhea and vomiting These symptoms are referred to as "flu-like symptoms." A lot of different illnesses, including the common cold, may have similar symptoms. DIAGNOSIS   There are tests that can tell if you have the H1N1 virus.  Confirmed cases of H1N1 will be reported to the state or local health department.  A doctor's exam may be needed to tell whether you have an infection that is a complication of the flu. HOME CARE INSTRUCTIONS   Stay informed. Visit the [facility] [place] website for current recommendations. Visit [employer].tnwww.cdc.gov/H1N1flu/. You may also call 1-800-CDC-INFO (91500843651-(725) 810-1961).  Get help early if you  develop any of the above symptoms.  If you are at high risk from complications of the flu, talk to your caregiver as soon as you develop flu-like symptoms. Those at higher risk for complications include:  People 65 years or older.  People with chronic medical conditions.  Pregnant women.  Young children.  Your caregiver may recommend antiviral medicine to help treat the flu.  If you get the flu, get plenty of rest, drink enough water and fluids to keep your urine clear or pale yellow, and avoid using alcohol or tobacco.  You may take over-the-counter medicine to relieve the symptoms of the flu if your caregiver approves. (Never give aspirin to children or teenagers who have flu-like symptoms, particularly fever). TREATMENT  If you do get sick, antiviral drugs are available. These drugs can make your illness milder and make you feel better faster. Treatment should start soon after illness starts. It is only effective if taken within the first day of becoming ill. Only your caregiver can prescribe antiviral medication.  PREVENTION   Cover your nose and mouth with a tissue or your arm when you cough or sneeze. Throw the tissue away.  Wash your hands often with soap and warm water, especially after you cough or sneeze. Alcohol-based cleaners are also effective against germs.  Avoid touching your eyes, nose or mouth. This is one way germs spread.  Try to avoid contact with sick people. Follow public health advice regarding school closures. Avoid crowds.  Stay home if you get sick. Limit contact  with others to keep from infecting them. People infected with the H1N1 virus may be able to infect others anywhere from 1 day before feeling sick to 5-7 days after getting flu symptoms.  An H1N1 vaccine is available to help protect against the virus. In addition to the H1N1 vaccine, you will need to be vaccinated for seasonal influenza. The H1N1 and seasonal vaccines may be given on the same day. The  CDC especially recommends the H1N1 vaccine for:  Pregnant women.  People who live with or care for children younger than 4 months of age.  Health care and emergency services personnel.  Persons between the ages of 20 months through 58 years of age.  People from ages 48 through 57 years who are at higher risk for H1N1 because of chronic health disorders or immune system problems. FACEMASKS In community and home settings, the use of facemasks and N95 respirators are not normally recommended. In certain circumstances, a facemask or N95 respirator may be used for persons at increased risk of severe illness from influenza. Your caregiver can give additional recommendations for facemask use. IN CHILDREN, EMERGENCY WARNING SIGNS THAT NEED URGENT MEDICAL CARE:  Fast breathing or trouble breathing.  Bluish skin color.  Not drinking enough fluids.  Not waking up or not interacting normally.  Being so fussy that the child does not want to be held.  Your child has an oral temperature above 102 F (38.9 C), not controlled by medicine.  Your baby is older than 3 months with a rectal temperature of 102 F (38.9 C) or higher.  Your baby is 59 months old or younger with a rectal temperature of 100.4 F (38 C) or higher.  Flu-like symptoms improve but then return with fever and worse cough. IN ADULTS, EMERGENCY WARNING SIGNS THAT NEED URGENT MEDICAL CARE:  Difficulty breathing or shortness of breath.  Pain or pressure in the chest or abdomen.  Sudden dizziness.  Confusion.  Severe or persistent vomiting.  Bluish color.  You have a oral temperature above 102 F (38.9 C), not controlled by medicine.  Flu-like symptoms improve but return with fever and worse cough. SEEK IMMEDIATE MEDICAL CARE IF:  You or someone you know is experiencing any of the above symptoms. When you arrive at the emergency center, report that you think you have the flu. You may be asked to wear a mask and/or sit  in a secluded area to protect others from getting sick. MAKE SURE YOU:   Understand these instructions.  Will watch your condition.  Will get help right away if you are not doing well or get worse. Some of this information courtesy of the CDC.  Document Released: 04/26/2008 Document Revised: 01/31/2012 Document Reviewed: 04/26/2008 [facility] Patient Information 2014 [place], [place].  Oseltamivir capsules What is this medicine? OSELTAMIVIR (os el TAM i vir) is an antiviral medicine. It is used to prevent and to treat some kinds of influenza or the flu. It will not work for colds or other viral infections. This medicine may be used for other purposes; ask your health care provider or pharmacist if you have questions. COMMON BRAND NAME(S): Tamiflu What should I tell my health care provider before I take this medicine? They need to know if you have any of the following conditions: -heart disease -immune system problems -kidney disease -liver disease -lung disease -an unusual or allergic reaction to oseltamivir, other medicines, foods, dyes, or preservatives -pregnant or trying to get pregnant -breast-feeding How should I use this  medicine? Take this medicine by mouth with a glass of water. Follow the directions on the prescription label. Start this medicine at the first sign of flu symptoms. You can take it with or without food. If it upsets your stomach, take it with food. Take your medicine at regular intervals. Do not take your medicine more often than directed. Take all of your medicine as directed even if you think you are better. Do not skip doses or stop your medicine early. Talk to your pediatrician regarding the use of this medicine in children. While this drug may be prescribed for children as young as 14 days for selected conditions, precautions do apply. Overdosage: If you think you have taken too much of this medicine contact a poison control center or emergency room at  once. NOTE: This medicine is only for you. Do not share this medicine with others. What if I miss a dose? If you miss a dose, take it as soon as you remember. If it is almost time for your next dose (within 2 hours), take only that dose. Do not take double or extra doses. What may interact with this medicine? Interactions are not expected. This list may not describe all possible interactions. Give your health care provider a list of all the medicines, herbs, non-prescription drugs, or dietary supplements you use. Also tell them if you smoke, drink alcohol, or use illegal drugs. Some items may interact with your medicine. What should I watch for while using this medicine? Visit your doctor or health care professional for regular check ups. Tell your doctor if your symptoms do not start to get better or if they get worse. If you have the flu, you may be at an increased risk of developing seizures, confusion, or abnormal behavior. This occurs early in the illness, and more frequently in children and teens. These events are not common, but may result in accidental injury to the patient. Families and caregivers of patients should watch for signs of unusual behavior and contact a doctor or health care professional right away if the patient shows signs of unusual behavior. This medicine is not a substitute for the flu shot. Talk to your doctor each year about an annual flu shot. What side effects may I notice from receiving this medicine? Side effects that you should report to your doctor or health care professional as soon as possible: -allergic reactions like skin rash, itching or hives, swelling of the face, lips, or tongue -anxiety, confusion, unusual behavior -breathing problems -hallucination, loss of contact with reality -redness, blistering, peeling or loosening of the skin, including inside the mouth -seizures Side effects that usually do not require medical attention (report to your doctor or  health care professional if they continue or are bothersome): -cough -diarrhea -dizziness -headache -nausea, vomiting -stomach pain This list may not describe all possible side effects. Call your doctor for medical advice about side effects. You may report side effects to FDA at 1-800-FDA-1088. Where should I keep my medicine? Keep out of the reach of children. Store at room temperature between 15 and 30 degrees C (59 and 86 degrees F). Throw away any unused medicine after the expiration date. NOTE: This sheet is a summary. It may not cover all possible information. If you have questions about this medicine, talk to your doctor, pharmacist, or health care provider.  2014, [employer] Standard. (2011-11-12 19:43:38)

## 2013-12-16 NOTE — Progress Notes (Signed)
Case discussed with Dr. Burnard Bunting at time of visit.  We reviewed the resident's history and exam and pertinent patient test results.  I agree with the assessment, diagnosis, and plan of care documented in the resident's note.

## 2014-01-01 ENCOUNTER — Encounter (HOSPITAL_COMMUNITY): Payer: Self-pay | Admitting: Emergency Medicine

## 2014-01-01 ENCOUNTER — Inpatient Hospital Stay (HOSPITAL_COMMUNITY)
Admission: EM | Admit: 2014-01-01 | Discharge: 2014-01-21 | DRG: 234 | Disposition: A | Discharge status: Skilled Nursing Facility | Payer: Medicare Other | Attending: Cardiothoracic Surgery | Admitting: Cardiothoracic Surgery

## 2014-01-01 ENCOUNTER — Emergency Department (HOSPITAL_COMMUNITY): Payer: Medicare Other

## 2014-01-01 DIAGNOSIS — J309 Allergic rhinitis, unspecified: Secondary | ICD-10-CM

## 2014-01-01 DIAGNOSIS — Z8673 Personal history of transient ischemic attack (TIA), and cerebral infarction without residual deficits: Secondary | ICD-10-CM

## 2014-01-01 DIAGNOSIS — E119 Type 2 diabetes mellitus without complications: Secondary | ICD-10-CM | POA: Diagnosis present

## 2014-01-01 DIAGNOSIS — K219 Gastro-esophageal reflux disease without esophagitis: Secondary | ICD-10-CM

## 2014-01-01 DIAGNOSIS — I249 Acute ischemic heart disease, unspecified: Secondary | ICD-10-CM

## 2014-01-01 DIAGNOSIS — I10 Essential (primary) hypertension: Secondary | ICD-10-CM | POA: Diagnosis present

## 2014-01-01 DIAGNOSIS — M25511 Pain in right shoulder: Secondary | ICD-10-CM | POA: Diagnosis present

## 2014-01-01 DIAGNOSIS — M19019 Primary osteoarthritis, unspecified shoulder: Secondary | ICD-10-CM | POA: Diagnosis present

## 2014-01-01 DIAGNOSIS — G8929 Other chronic pain: Secondary | ICD-10-CM

## 2014-01-01 DIAGNOSIS — Z96659 Presence of unspecified artificial knee joint: Secondary | ICD-10-CM

## 2014-01-01 DIAGNOSIS — I208 Other forms of angina pectoris: Secondary | ICD-10-CM

## 2014-01-01 DIAGNOSIS — R9439 Abnormal result of other cardiovascular function study: Secondary | ICD-10-CM | POA: Diagnosis not present

## 2014-01-01 DIAGNOSIS — K029 Dental caries, unspecified: Secondary | ICD-10-CM | POA: Diagnosis present

## 2014-01-01 DIAGNOSIS — J45909 Unspecified asthma, uncomplicated: Secondary | ICD-10-CM | POA: Diagnosis present

## 2014-01-01 DIAGNOSIS — E8779 Other fluid overload: Secondary | ICD-10-CM | POA: Diagnosis not present

## 2014-01-01 DIAGNOSIS — R079 Chest pain, unspecified: Secondary | ICD-10-CM

## 2014-01-01 DIAGNOSIS — L282 Other prurigo: Secondary | ICD-10-CM

## 2014-01-01 DIAGNOSIS — D62 Acute posthemorrhagic anemia: Secondary | ICD-10-CM | POA: Diagnosis not present

## 2014-01-01 DIAGNOSIS — I252 Old myocardial infarction: Secondary | ICD-10-CM

## 2014-01-01 DIAGNOSIS — I209 Angina pectoris, unspecified: Secondary | ICD-10-CM

## 2014-01-01 DIAGNOSIS — M810 Age-related osteoporosis without current pathological fracture: Secondary | ICD-10-CM | POA: Diagnosis present

## 2014-01-01 DIAGNOSIS — R931 Abnormal findings on diagnostic imaging of heart and coronary circulation: Secondary | ICD-10-CM

## 2014-01-01 DIAGNOSIS — F19951 Other psychoactive substance use, unspecified with psychoactive substance-induced psychotic disorder with hallucinations: Secondary | ICD-10-CM | POA: Diagnosis not present

## 2014-01-01 DIAGNOSIS — K5909 Other constipation: Secondary | ICD-10-CM | POA: Diagnosis not present

## 2014-01-01 DIAGNOSIS — J111 Influenza due to unidentified influenza virus with other respiratory manifestations: Secondary | ICD-10-CM

## 2014-01-01 DIAGNOSIS — G4733 Obstructive sleep apnea (adult) (pediatric): Secondary | ICD-10-CM | POA: Diagnosis present

## 2014-01-01 DIAGNOSIS — Z8249 Family history of ischemic heart disease and other diseases of the circulatory system: Secondary | ICD-10-CM

## 2014-01-01 DIAGNOSIS — M25569 Pain in unspecified knee: Secondary | ICD-10-CM

## 2014-01-01 DIAGNOSIS — E66813 Obesity, class 3: Secondary | ICD-10-CM

## 2014-01-01 DIAGNOSIS — B370 Candidal stomatitis: Secondary | ICD-10-CM | POA: Diagnosis not present

## 2014-01-01 DIAGNOSIS — R5381 Other malaise: Secondary | ICD-10-CM | POA: Diagnosis not present

## 2014-01-01 DIAGNOSIS — J9819 Other pulmonary collapse: Secondary | ICD-10-CM | POA: Diagnosis not present

## 2014-01-01 DIAGNOSIS — S025XXA Fracture of tooth (traumatic), initial encounter for closed fracture: Secondary | ICD-10-CM

## 2014-01-01 DIAGNOSIS — Z951 Presence of aortocoronary bypass graft: Secondary | ICD-10-CM

## 2014-01-01 DIAGNOSIS — I2510 Atherosclerotic heart disease of native coronary artery without angina pectoris: Principal | ICD-10-CM | POA: Diagnosis present

## 2014-01-01 DIAGNOSIS — E669 Obesity, unspecified: Secondary | ICD-10-CM

## 2014-01-01 DIAGNOSIS — N8502 Endometrial intraepithelial neoplasia [EIN]: Secondary | ICD-10-CM

## 2014-01-01 DIAGNOSIS — E785 Hyperlipidemia, unspecified: Secondary | ICD-10-CM | POA: Diagnosis present

## 2014-01-01 DIAGNOSIS — Z79899 Other long term (current) drug therapy: Secondary | ICD-10-CM

## 2014-01-01 DIAGNOSIS — Z6841 Body Mass Index (BMI) 40.0 and over, adult: Secondary | ICD-10-CM

## 2014-01-01 DIAGNOSIS — M21619 Bunion of unspecified foot: Secondary | ICD-10-CM

## 2014-01-01 DIAGNOSIS — E876 Hypokalemia: Secondary | ICD-10-CM | POA: Diagnosis not present

## 2014-01-01 DIAGNOSIS — B86 Scabies: Secondary | ICD-10-CM

## 2014-01-01 DIAGNOSIS — T40605A Adverse effect of unspecified narcotics, initial encounter: Secondary | ICD-10-CM | POA: Diagnosis not present

## 2014-01-01 DIAGNOSIS — I2584 Coronary atherosclerosis due to calcified coronary lesion: Secondary | ICD-10-CM | POA: Diagnosis present

## 2014-01-01 DIAGNOSIS — I2089 Other forms of angina pectoris: Secondary | ICD-10-CM | POA: Diagnosis present

## 2014-01-01 DIAGNOSIS — I200 Unstable angina: Secondary | ICD-10-CM | POA: Diagnosis present

## 2014-01-01 LAB — BASIC METABOLIC PANEL
BUN: 12 mg/dL (ref 6–23)
CALCIUM: 9.3 mg/dL (ref 8.4–10.5)
CO2: 26 mEq/L (ref 19–32)
Chloride: 101 mEq/L (ref 96–112)
Creatinine, Ser: 0.96 mg/dL (ref 0.50–1.10)
GFR calc Af Amer: 74 mL/min — ABNORMAL LOW (ref >90)
GFR, EST NON AFRICAN AMERICAN: 63 mL/min — AB (ref >90)
Glucose, Bld: 129 mg/dL — ABNORMAL HIGH (ref 70–99)
Potassium: 3.9 mEq/L (ref 3.7–5.3)
Sodium: 141 mEq/L (ref 137–147)

## 2014-01-01 LAB — CBC
HCT: 34.6 % — ABNORMAL LOW (ref 36.0–46.0)
Hemoglobin: 11.9 g/dL — ABNORMAL LOW (ref 12.0–15.0)
MCH: 27.5 pg (ref 26.0–34.0)
MCHC: 34.4 g/dL (ref 30.0–36.0)
MCV: 79.9 fL (ref 78.0–100.0)
PLATELETS: 263 10*3/uL (ref 150–400)
RBC: 4.33 MIL/uL (ref 3.87–5.11)
RDW: 15.4 % (ref 11.5–15.5)
WBC: 7.6 10*3/uL (ref 4.0–10.5)

## 2014-01-01 LAB — POCT I-STAT TROPONIN I: TROPONIN I, POC: 0.01 ng/mL (ref 0.00–0.08)

## 2014-01-01 MED ORDER — NITROGLYCERIN 0.4 MG SL SUBL
0.4000 mg | SUBLINGUAL_TABLET | SUBLINGUAL | Status: DC | PRN
  Administered 2014-01-02 – 2014-01-07 (×2): 0.4 mg via SUBLINGUAL
  Filled 2014-01-01: qty 25

## 2014-01-01 MED ORDER — ADULT MULTIVITAMIN W/MINERALS CH
1.0000 | ORAL_TABLET | ORAL | Status: DC
  Administered 2014-01-02 – 2014-01-07 (×2): 1 via ORAL
  Filled 2014-01-01 (×5): qty 1

## 2014-01-01 MED ORDER — SENNOSIDES-DOCUSATE SODIUM 8.6-50 MG PO TABS
1.0000 | ORAL_TABLET | Freq: Every day | ORAL | Status: DC
  Administered 2014-01-02 – 2014-01-06 (×5): 1 via ORAL
  Filled 2014-01-01 (×8): qty 1

## 2014-01-01 MED ORDER — PNEUMOCOCCAL VAC POLYVALENT 25 MCG/0.5ML IJ INJ
0.5000 mL | INJECTION | INTRAMUSCULAR | Status: DC
  Filled 2014-01-01: qty 0.5

## 2014-01-01 MED ORDER — IRBESARTAN 300 MG PO TABS
300.0000 mg | ORAL_TABLET | Freq: Every day | ORAL | Status: DC
  Administered 2014-01-02 – 2014-01-08 (×7): 300 mg via ORAL
  Filled 2014-01-01 (×8): qty 1

## 2014-01-01 MED ORDER — FERROUS SULFATE 325 (65 FE) MG PO TABS
325.0000 mg | ORAL_TABLET | Freq: Every day | ORAL | Status: DC
  Administered 2014-01-02 – 2014-01-08 (×7): 325 mg via ORAL
  Filled 2014-01-01 (×9): qty 1

## 2014-01-01 MED ORDER — IRON-VITAMIN C 100-250 MG PO TABS: 1.0000 | ORAL_TABLET | Freq: Every day | ORAL | Status: DC

## 2014-01-01 MED ORDER — PANTOPRAZOLE SODIUM 40 MG PO TBEC
80.0000 mg | DELAYED_RELEASE_TABLET | Freq: Every day | ORAL | Status: DC
  Administered 2014-01-02 – 2014-01-08 (×7): 80 mg via ORAL
  Filled 2014-01-01 (×7): qty 2

## 2014-01-01 MED ORDER — VITAMIN C 250 MG PO TABS
250.0000 mg | ORAL_TABLET | Freq: Every day | ORAL | Status: DC
  Administered 2014-01-02 – 2014-01-08 (×7): 250 mg via ORAL
  Filled 2014-01-01 (×10): qty 1

## 2014-01-01 MED ORDER — ATORVASTATIN CALCIUM 80 MG PO TABS
80.0000 mg | ORAL_TABLET | Freq: Every day | ORAL | Status: DC
Start: 2014-01-02 — End: 2014-01-10
  Filled 2014-01-01 (×9): qty 1

## 2014-01-01 MED ORDER — ASPIRIN EC 81 MG PO TBEC
81.0000 mg | DELAYED_RELEASE_TABLET | Freq: Every day | ORAL | Status: DC
  Administered 2014-01-02 – 2014-01-08 (×7): 81 mg via ORAL
  Filled 2014-01-01 (×8): qty 1

## 2014-01-01 MED ORDER — ALBUTEROL SULFATE (2.5 MG/3ML) 0.083% IN NEBU: 3.0000 mL | INHALATION_SOLUTION | Freq: Four times a day (QID) | RESPIRATORY_TRACT | Status: DC | PRN

## 2014-01-01 MED ORDER — INFLUENZA VAC SPLIT QUAD 0.5 ML IM SUSP
0.5000 mL | INTRAMUSCULAR | Status: AC
  Administered 2014-01-02: 0.5 mL via INTRAMUSCULAR
  Filled 2014-01-01: qty 0.5

## 2014-01-01 MED ORDER — CALCIUM CARBONATE-VITAMIN D 500-200 MG-UNIT PO TABS
1.0000 | ORAL_TABLET | Freq: Every day | ORAL | Status: DC
  Administered 2014-01-02 – 2014-01-08 (×7): 1 via ORAL
  Filled 2014-01-01 (×10): qty 1

## 2014-01-01 MED ORDER — CALCIUM CITRATE-VITAMIN D 250-200 MG-UNIT PO TABS: 2.0000 | ORAL_TABLET | Freq: Every day | ORAL | Status: DC

## 2014-01-01 MED ORDER — DIPHENHYDRAMINE HCL 25 MG PO CAPS
25.0000 mg | ORAL_CAPSULE | Freq: Once | ORAL | Status: AC
  Administered 2014-01-02: 25 mg via ORAL
  Filled 2014-01-01: qty 1

## 2014-01-01 MED ORDER — HEPARIN (PORCINE) IN NACL 100-0.45 UNIT/ML-% IJ SOLN
2150.0000 [IU]/h | INTRAMUSCULAR | Status: DC
  Administered 2014-01-01: 1600 [IU]/h via INTRAVENOUS
  Administered 2014-01-02: 2000 [IU]/h via INTRAVENOUS
  Administered 2014-01-03 – 2014-01-04 (×3): 2150 [IU]/h via INTRAVENOUS
  Filled 2014-01-01 (×7): qty 250

## 2014-01-01 MED ORDER — FLUTICASONE FUROATE 27.5 MCG/SPRAY NA SUSP: 2.0000 | Freq: Every day | NASAL | Status: DC

## 2014-01-01 MED ORDER — METOPROLOL TARTRATE 25 MG PO TABS
25.0000 mg | ORAL_TABLET | Freq: Two times a day (BID) | ORAL | Status: DC
  Administered 2014-01-01 – 2014-01-08 (×15): 25 mg via ORAL
  Filled 2014-01-01 (×18): qty 1

## 2014-01-01 MED ORDER — HYDROCHLOROTHIAZIDE 25 MG PO TABS
12.5000 mg | ORAL_TABLET | Freq: Every day | ORAL | Status: DC
  Filled 2014-01-01: qty 0.5

## 2014-01-01 MED ORDER — FLUTICASONE PROPIONATE 50 MCG/ACT NA SUSP
2.0000 | Freq: Every day | NASAL | Status: DC
  Administered 2014-01-02 – 2014-01-08 (×6): 2 via NASAL
  Filled 2014-01-01 (×3): qty 16

## 2014-01-01 MED ORDER — ALUM & MAG HYDROXIDE-SIMETH 400-400-40 MG/5ML PO SUSP: 10.0000 mL | Freq: Four times a day (QID) | ORAL | Status: DC | PRN

## 2014-01-01 MED ORDER — CYCLOSPORINE 0.05 % OP EMUL
1.0000 [drp] | Freq: Two times a day (BID) | OPHTHALMIC | Status: DC
  Administered 2014-01-01 – 2014-01-02 (×2): 1 [drp] via OPHTHALMIC
  Administered 2014-01-02 – 2014-01-03 (×2): via OPHTHALMIC
  Administered 2014-01-03 – 2014-01-05 (×4): 1 [drp] via OPHTHALMIC
  Administered 2014-01-05: 8 [drp] via OPHTHALMIC
  Administered 2014-01-06 – 2014-01-13 (×11): 1 [drp] via OPHTHALMIC
  Administered 2014-01-15: 11:00:00 via OPHTHALMIC
  Filled 2014-01-01 (×45): qty 1

## 2014-01-01 MED ORDER — LABETALOL HCL 5 MG/ML IV SOLN
20.0000 mg | Freq: Once | INTRAVENOUS | Status: AC
  Administered 2014-01-01: 20 mg via INTRAVENOUS
  Filled 2014-01-01: qty 4

## 2014-01-01 MED ORDER — ALUM & MAG HYDROXIDE-SIMETH 200-200-20 MG/5ML PO SUSP
15.0000 mL | Freq: Four times a day (QID) | ORAL | Status: DC | PRN
  Administered 2014-01-02: 15 mL via ORAL
  Filled 2014-01-01: qty 30

## 2014-01-01 MED ORDER — SODIUM CHLORIDE 0.9 % IV SOLN
INTRAVENOUS | Status: DC
  Administered 2014-01-01 – 2014-01-03 (×4): via INTRAVENOUS

## 2014-01-01 MED ORDER — ONDANSETRON HCL 4 MG/2ML IJ SOLN
4.0000 mg | Freq: Four times a day (QID) | INTRAMUSCULAR | Status: DC | PRN
  Administered 2014-01-02 (×2): 4 mg via INTRAVENOUS
  Filled 2014-01-01 (×3): qty 2

## 2014-01-01 MED ORDER — ACETAMINOPHEN 325 MG PO TABS
650.0000 mg | ORAL_TABLET | ORAL | Status: DC | PRN
  Administered 2014-01-02 – 2014-01-08 (×16): 650 mg via ORAL
  Filled 2014-01-01 (×16): qty 2

## 2014-01-01 MED ORDER — HEPARIN BOLUS VIA INFUSION
4000.0000 [IU] | Freq: Once | INTRAVENOUS | Status: AC
  Administered 2014-01-01: 4000 [IU] via INTRAVENOUS
  Filled 2014-01-01: qty 4000

## 2014-01-01 NOTE — ED Provider Notes (Signed)
CSN: [id number]     Arrival date & time 01/01/14  1958 History   First MD Initiated Contact with Patient 01/01/14 2000     Chief Complaint  Patient presents with  . Chest Pain     (Consider location/radiation/quality/duration/timing/severity/associated sxs/prior Treatment) HPI Comments: Patient is a 58 year old morbidly obese female with a past medical history of hypertension, anemia, GERD, sleep apnea and chronic headaches who presents to the emergency department via EMS complaining of sudden onset, 10/10 midsternal chest pain radiating to her right shoulder and right scapular area beginning around 6:45 PM today. Pain was constant until EMS arrival, patient was given 324 mg aspirin and one sublingual nitroglycerin with complete relief of her symptoms. Patient states she had some right shoulder pain 3 days ago, however did not have any chest pain at that time. Admits to associated nausea and shortness of breath. Shortness of breath was nonexertional. Denies headache, dizziness, lightheadedness, vision changes. Initial blood pressure by EMS was over 200/100. Blood pressure on arrival to the ED was 177/154. Patient states she has chronic swelling of her left lower extremity intermittently, new symptoms, present since after knee replacement in 2011. Denies exogenous estrogen use, history of blood clots.  Patient is a 58 y.o. female presenting with chest pain. The history is provided by the patient and the EMS personnel.  Chest Pain Associated symptoms: nausea and shortness of breath     Past Medical History  Diagnosis Date  . Seasonal allergies   . Asthma     History of  . Hypertension   . Iron deficiency 12-07-2011  . Anemia   . GERD (gastroesophageal reflux disease)   . Heart murmur   . Osteoporosis   . PONV (postoperative nausea and vomiting)   . Dysrhythmia     occ palpitations   . Sleep apnea     no CPAP machine , borderine   . Headache(784.0)     occasional   . Osteoarthritis     osteoarthritis   . Morbid obesity   . Acute urinary retention s/p Foley 01/07/2012  . Hemorrhoids, internal, with bleeding & prolapse 12/13/2011  . Myocardial infarction     unsure when; per cardiologist report  . Chronic headaches    Past Surgical History  Procedure Laterality Date  . Knee surgery      Both knees  . Total knee arthroplasty  04/29/2011    Left knee  . Multiple tooth extractions    . Wisdom tooth extraction    . Colonoscopy  2009  . Knee arthroscopy  1991  . Transanal hemorrhoidal dearterliaization  01/06/12    with external hemorrhoid removal   Family History  Problem Relation Age of Onset  . Colon cancer Neg Hx   . Diabetes Mother   . Hypertension Mother   . Heart disease Sister     Congestive heart failure  . Diabetes type II Sister   . Kidney disease Brother    History  Substance Use Topics  . Smoking status: Never Smoker   . Smokeless tobacco: Never Used  . Alcohol Use: No   OB History   Grav Para Term Preterm Abortions TAB SAB Ect Mult Living   2 2 2  0 0 0 0 0 0 2     Review of Systems  Respiratory: Positive for shortness of breath.   Cardiovascular: Positive for chest pain.  Gastrointestinal: Positive for nausea.  All other systems reviewed and are negative.      Allergies  Cephalosporins;  Codeine; Latex; Penicillins; and Prednisone  Home Medications   Current Outpatient Rx  Name  Route  Sig  Dispense  Refill  . albuterol (PROVENTIL HFA;VENTOLIN HFA) 108 (90 BASE) MCG/ACT inhaler   Inhalation   Inhale 2 puffs into the lungs every 6 (six) hours as needed. Wheezing   3.7 g   5   . alum & mag hydroxide-simeth (MYLANTA DOUBLE-STRENGTH) 400-400-40 MG/5ML suspension   Oral   Take 10 mLs by mouth every 6 (six) hours as needed (for cramping or bloating).   355 mL   0   . Calcium Citrate-Vitamin D (CITRACAL/VITAMIN D) 250-200 MG-UNIT TABS   Oral   Take 2 tablets by mouth daily.   90 each   4   . cycloSPORINE (RESTASIS) 0.05 %  ophthalmic emulsion   Both Eyes   Place 1 drop into both eyes 2 (two) times daily.         . diclofenac sodium (VOLTAREN) 1 % GEL   Topical   Apply 2 g topically 2 (two) times daily as needed. TOPICAL TO KNEE, SHOULDER, WRIST AND ANKLE FOR PAIN   1 Tube   0   . diphenhydrAMINE (BENADRYL) 25 mg capsule   Oral   Take 1 capsule (25 mg total) by mouth every 4 (four) hours as needed for itching.   24 capsule   2   . fluticasone (VERAMYST) 27.5 MCG/SPRAY nasal spray   Nasal   Place 2 sprays into the nose daily.         . hydrochlorothiazide (HYDRODIURIL) 12.5 MG tablet   Oral   Take 1 tablet (12.5 mg total) by mouth daily.   30 tablet   6   . hydrOXYzine (ATARAX/VISTARIL) 10 MG tablet   Oral   Take 1 tablet (10 mg total) by mouth 3 (three) times daily as needed for itching.   60 tablet   0   . Iron-Vitamin C 100-250 MG TABS   Oral   Take 1 tablet by mouth daily.   60 each   2   . Multiple Vitamin (MULITIVITAMIN WITH MINERALS) TABS   Oral   Take 1 tablet by mouth 3 (three) times a week.          Marland Kitchen NEXIUM 40 MG capsule      TAKE ONE CAPSULE BY MOUTH EVERY DAY   30 capsule   1   . Polyethyl Glycol-Propyl Glycol (SYSTANE FREE OP)   Both Eyes   Place 1 drop into both eyes daily.         Marland Kitchen senna-docusate (SENOKOT-S) 8.6-50 MG per tablet   Oral   Take 1 tablet by mouth daily.   30 tablet   5   . triamcinolone cream (KENALOG) 0.1 %      Apply on affected skin 2 times a day as needed   85.2 g   1   . valsartan (DIOVAN) 320 MG tablet   Oral   Take 1 tablet (320 mg total) by mouth daily.   120 tablet   3    BP 147/66  Pulse 77  Resp 23  SpO2 100%  LMP 10/26/2013 Physical Exam  Nursing note and vitals reviewed. Constitutional: She is oriented to person, place, and time. She appears well-developed and well-nourished. No distress.  Morbidly obese.  HENT:  Head: Normocephalic and atraumatic.  Mouth/Throat: Oropharynx is clear and moist.  Eyes:  Conjunctivae and EOM are normal. Pupils are equal, round, and reactive to light.  Neck:  Normal range of motion. Neck supple. No JVD present.  Cardiovascular: Normal rate, regular rhythm, normal heart sounds and intact distal pulses.   No extremity edema. Equal distal pulses.  Pulmonary/Chest: Effort normal and breath sounds normal.  Exam limited due to patient's body habitus.  Abdominal: Soft. Bowel sounds are normal. There is no tenderness.  Musculoskeletal: Normal range of motion. She exhibits no edema.  Neurological: She is alert and oriented to person, place, and time. She has normal strength. No sensory deficit.  Moves limbs without ataxia. Speech fluent, goal oriented. Equal grip strength.  Skin: Skin is warm and dry. She is not diaphoretic.  Psychiatric: She has a normal mood and affect. Her behavior is normal.    ED Course  Procedures (including critical care time) Labs Review Labs Reviewed  CBC - Abnormal; Notable for the following:    Hemoglobin 11.9 (*)    HCT 34.6 (*)    All other components within normal limits  BASIC METABOLIC PANEL - Abnormal; Notable for the following:    Glucose, Bld 129 (*)    GFR calc non Af Amer 63 (*)    GFR calc Af Amer 74 (*)    All other components within normal limits  POCT I-STAT TROPONIN I   Imaging Review Dg Chest 2 View  01/01/2014   CLINICAL DATA:  Severe chest pain beginning 3 days ago.  EXAM: CHEST  2 VIEW  COMPARISON:  PA and lateral chest 06/11/2012.  FINDINGS: Lungs clear. Heart size normal. No pneumothorax or pleural effusion.  IMPRESSION: No acute disease.   Electronically Signed   By: Inge Rise M.D.   On: 01/01/2014 21:23    EKG Interpretation    Date/Time:  Tuesday January 01 2014 20:24:32 EST Ventricular Rate:  81 PR Interval:  184 QRS Duration: 82 QT Interval:  391 QTC Calculation: 454 R Axis:   38 Text Interpretation:  Sinus rhythm Nonspeciific changes inferior leads. Confirmed by Jeneen Rinks  MD, [place] ([id number])  on 01/01/2014 8:31:01 PM            MDM   Final diagnoses:  Chest pain   Pt presenting with chest pain, hypertension, complete resolution with one SL nitro. Pt in NAD, hypertensive, vitals otherwise stable. Doubt PE. Symptoms most likely cardiac in origin. BP controlled in ED with labetalol. Labs without acute findings. EKG without any changes. CXR clear. Symptoms have not returned in the ED. Pt will be admitted for obs. Admission accepted by IM teaching service, attending Dr. Tommy Medal. Case discussed with attending Dr. Jeneen Rinks who agrees with plan of care.   Illene Labrador, PA-C 01/01/14 2142

## 2014-01-01 NOTE — H&P (Signed)
Date: 01/01/2014               Patient Name:  [name] MRN: [id number]  DOB: [birth year] Age / Sex: 58 y.o., female   PCP: Madilyn Fireman, MD         Medical Service: Internal Medicine Teaching Service         Attending Physician: Dr. Truman Hayward, MD    First Contact: Dr. Heber [place] Pager: [phone]  Second Contact: Dr. Alice Rieger Pager: [phone]       After Hours (After 5p/  First Contact Pager: [phone]  weekends / holidays): Second Contact Pager: [phone]   Chief Complaint: chest pain  History of Present Illness:  The patient is a 58 yo woman, history of HTN, OSA, obesity, presenting with chest pain.  On the evening of admission at 6pm, the patient was lying in bed when she developed acute onset of squeezing left and right sided chest pain, radiating to her right shoulder, severity of pain not related to change in position or breathing, with associated shortness of breath and nausea, 10/10 in severity. Pt also endorsed some mild back pain. This is the 1st episode of such severe chest pain. Symptoms persisted until EMS arrived, and resolved with nitro.  The patient notes a 3-week history of less intense chest "pressure" in the same distribution, occurring when doing housework, improving after stopping to rest.  The patient also notes a 3-day history of right shoulder and arm pain, after lifting some heavy groceries 5 days ago, which is constant, and only partially relieved by Aleve.  The patient also notes heartburn, only partially controlled by Nexium.  Two weeks ago, the patient was seen in [facility] for a cough, thought to represent flu, and treated with tamiflu, with significant improvement in symptoms, though still with lingering dry cough.  The patient has no history of smoking, and no significant FH of MI (mother had MI in her 59's).  She takes a daily aspirin.  Meds: Prescriptions prior to admission  Medication Sig Dispense Refill  . albuterol (PROVENTIL HFA;VENTOLIN HFA) 108 (90  BASE) MCG/ACT inhaler Inhale 2 puffs into the lungs every 6 (six) hours as needed. Wheezing  3.7 g  5  . alum & mag hydroxide-simeth (MYLANTA DOUBLE-STRENGTH) 400-400-40 MG/5ML suspension Take 10 mLs by mouth every 6 (six) hours as needed (for cramping or bloating).  355 mL  0  . Calcium Citrate-Vitamin D (CITRACAL/VITAMIN D) 250-200 MG-UNIT TABS Take 2 tablets by mouth daily.  90 each  4  . cycloSPORINE (RESTASIS) 0.05 % ophthalmic emulsion Place 1 drop into both eyes 2 (two) times daily.      . diclofenac sodium (VOLTAREN) 1 % GEL Apply 2 g topically 2 (two) times daily as needed. TOPICAL TO KNEE, SHOULDER, WRIST AND ANKLE FOR PAIN  1 Tube  0  . diphenhydrAMINE (BENADRYL) 25 mg capsule Take 1 capsule (25 mg total) by mouth every 4 (four) hours as needed for itching.  24 capsule  2  . fluticasone (VERAMYST) 27.5 MCG/SPRAY nasal spray Place 2 sprays into the nose daily.      . hydrochlorothiazide (HYDRODIURIL) 12.5 MG tablet Take 1 tablet (12.5 mg total) by mouth daily.  30 tablet  6  . hydrOXYzine (ATARAX/VISTARIL) 10 MG tablet Take 1 tablet (10 mg total) by mouth 3 (three) times daily as needed for itching.  60 tablet  0  . Iron-Vitamin C 100-250 MG TABS Take 1 tablet by mouth daily.  60 each  2  . Multiple Vitamin (MULITIVITAMIN WITH MINERALS) TABS Take 1 tablet by mouth 3 (three) times a week.       Marland Kitchen NEXIUM 40 MG capsule TAKE ONE CAPSULE BY MOUTH EVERY DAY  30 capsule  1  . Polyethyl Glycol-Propyl Glycol (SYSTANE FREE OP) Place 1 drop into both eyes daily.      Marland Kitchen senna-docusate (SENOKOT-S) 8.6-50 MG per tablet Take 1 tablet by mouth daily.  30 tablet  5  . triamcinolone cream (KENALOG) 0.1 % Apply on affected skin 2 times a day as needed  85.2 g  1  . valsartan (DIOVAN) 320 MG tablet Take 1 tablet (320 mg total) by mouth daily.  120 tablet  3    Allergies: Allergies as of 01/01/2014 - Review Complete 01/01/2014  Allergen Reaction Noted  . Cephalosporins Anaphylaxis 06/17/2012  . Codeine  Other (See Comments) 09/18/2009  . Latex Itching   . Penicillins  06/30/2007  . Prednisone  06/11/2012   Past Medical History  Diagnosis Date  . Seasonal allergies   . Asthma     History of  . Hypertension   . Iron deficiency 12-07-2011  . Anemia   . GERD (gastroesophageal reflux disease)   . Heart murmur   . Osteoporosis   . PONV (postoperative nausea and vomiting)   . Dysrhythmia     occ palpitations   . Sleep apnea     no CPAP machine , borderine   . Headache(784.0)     occasional   . Osteoarthritis     osteoarthritis   . Morbid obesity   . Acute urinary retention s/p Foley 01/07/2012  . Hemorrhoids, internal, with bleeding & prolapse 12/13/2011  . Myocardial infarction     unsure when; per cardiologist report  . Chronic headaches    Past Surgical History  Procedure Laterality Date  . Knee surgery      Both knees  . Total knee arthroplasty  04/29/2011    Left knee  . Multiple tooth extractions    . Wisdom tooth extraction    . Colonoscopy  2009  . Knee arthroscopy  1991  . Transanal hemorrhoidal dearterliaization  01/06/12    with external hemorrhoid removal   Family History  Problem Relation Age of Onset  . Colon cancer Neg Hx   . Diabetes Mother   . Hypertension Mother   . Heart disease Sister     Congestive heart failure  . Diabetes type II Sister   . Kidney disease Brother   . Heart attack Mother 80   History   Social History  . Marital Status: Single    Spouse Name: N/A    Number of Children: 2  . Years of Education: N/A   Occupational History  . Retired    Social History Main Topics  . Smoking status: Never Smoker   . Smokeless tobacco: Never Used  . Alcohol Use: No  . Drug Use: No  . Sexual Activity: Not Currently   Other Topics Concern  . Not on file   Social History Narrative  . No narrative on file    Review of Systems: General: no fevers, chills, changes in weight, changes in appetite Skin: no rash HEENT: no blurry vision,  hearing changes, sore throat Pulm: no wheezing CV: see HPI Abd: no abdominal pain, diarrhea/constipation GU: no dysuria, hematuria, polyuria Ext: see HPI Neuro: no weakness, numbness, or tingling  Physical Exam: Blood pressure 195/90, pulse 79, temperature 97.9 F (36.6 C), temperature source  Oral, resp. rate 18, height 5\' 8"  (1.727 m), weight 319 lb 14.4 oz (145.106 kg), last menstrual period 10/26/2013, SpO2 99.00%. General: alert, cooperative, and in no apparent distress HEENT: pupils equal round and reactive to light, vision grossly intact, oropharynx clear and non-erythematous  Neck: supple Lungs: clear to ascultation bilaterally, normal work of respiration, no wheezes, rales, ronchi Heart: regular rate and rhythm, no murmurs, gallops, or rubs, minimal chest wall "pressure" felt with palpation, no significant change. Abdomen: soft, non-tender, non-distended, normal bowel sounds Extremities: trace bilateral non-pitting edema Neurologic: alert & oriented X3, cranial nerves II-XII intact, strength grossly intact, sensation intact to light touch  Lab results: Basic Metabolic Panel:  Recent Labs  01/01/14 2020  NA 141  K 3.9  CL 101  CO2 26  GLUCOSE 129*  BUN 12  CREATININE 0.96  CALCIUM 9.3   CBC:  Recent Labs  01/01/14 2020  WBC 7.6  HGB 11.9*  HCT 34.6*  MCV 79.9  PLT 263   Cardiac Enzymes: istat troponin: 0.01  Imaging results:  Dg Chest 2 View  01/01/2014   CLINICAL DATA:  Severe chest pain beginning 3 days ago.  EXAM: CHEST  2 VIEW  COMPARISON:  PA and lateral chest 06/11/2012.  FINDINGS: Lungs clear. Heart size normal. No pneumothorax or pleural effusion.  IMPRESSION: No acute disease.   Electronically Signed   By: Inge Rise M.D.   On: 01/01/2014 21:23    Other results: EKG: NSR, T-wave flattening in II, III, aVF (seen on prior EKG, questionably more pronounced today), normal Axis.  Assessment & Plan by Problem: Active Problems:   Chest  pain  Anginal Chest pain- Considering pt Risk factors, ACS is of concern- HLD, obesity, HTN. Pts complaints are in keeping with typical anginal pain- Increase in chest pain with exertion, improvement with Nitroglycerin and substernal chest pain- pressure-like.  Calculated TIMI score- 3 points, giving her a 13% risk at 14 days of all-cause mortality, new MI. So far- I-stat trop- Negative, and EKG- no ST or T wave abnormalities that are suggestive of Ischemia.  - Cycle Troponins. - Aspirin- was given 324mg  by EMS, cont at 81mg  daily. - HBA1C in the Am. - BP in both arms. - Metoprolol- 25mg  BID - Heparin- per pharmacy- Therapeutic dose. - NPO from midnight. - Stress test in the Am. - Ondansetron 4mg  Q6H - Atorvastatin- 80mg  daily. - N/s 24mls/hr from midnight. - EKG in the Am.  Right shoulder Pain- Most likely of musculoskeletal origin, considering hx of lifting heavy groceries 2 days prior.  HTN- Elevated on admission, was treated with IV labetalol- 20mg  stat. Pt claims compliance with home meds-  HCTZ- 12.5mg  and Valsatan- 320mg  daily. Blood pressure still elevated. - Continue home meds on admission.  Asthma- Appears mostly controlled, home meds- Albuterol inhaler and fluticasone nasal spray.   HLD- Last lipid profile- 10/15/2013- LDL- 123, Total- 195, TG- 110. Not on statins.  OSA- Was diagnosed after a sleep study but a CPAP was not recommended.  Dispo: Disposition is deferred at this time, awaiting improvement of current medical problems. Anticipated discharge in approximately 1-2 day(s).   The patient does have a current PCP (Madilyn Fireman, MD) and does need an [facility] hospital follow-up appointment after discharge.  Signed: Jenetta Downer, MD 01/01/2014, 11:59 PM

## 2014-01-01 NOTE — ED Notes (Signed)
Admitting MD at BS.  

## 2014-01-01 NOTE — ED Notes (Signed)
Pt presents via [facility] from home with c/o Central CP that began at 1845 this evening. Pt given 324mg  ASA and 1SL Nitro.  Pt also c/o right shoulder pain since Saturday. Pt was in 10/10 pain prior to receiving nitro, pt is now pain free. Robyn PA at bedside. Report given to [facility].

## 2014-01-01 NOTE — Progress Notes (Signed)
ANTICOAGULATION CONSULT NOTE - Initial Consult  Pharmacy Consult for Heparin Indication: chest pain/ACS  Allergies  Allergen Reactions  . Cephalosporins Anaphylaxis    Tongue swelling, gum pain  . Codeine Other (See Comments)    hallucinations  . Latex Itching  . Penicillins     Pt reports no longer allergic  . Prednisone     Heart beating fast       Patient Measurements: Height: 5\' 8"  (172.7 cm) Weight: 319 lb 14.4 oz (145.106 kg) IBW/kg (Calculated) : 63.9 Heparin Dosing Weight: 100 kg  Vital Signs: Temp: 97.9 F (36.6 C) (02/10 2237) Temp src: Oral (02/10 2237) BP: 195/90 mmHg (02/10 2237) Pulse Rate: 79 (02/10 2237)  Labs:  Recent Labs  01/01/14 2020  HGB 11.9*  HCT 34.6*  PLT 263  CREATININE 0.96    Estimated Creatinine Clearance: 96 ml/min (by C-G formula based on Cr of 0.96).   Medical History: Past Medical History  Diagnosis Date  . Seasonal allergies   . Asthma     History of  . Hypertension   . Iron deficiency 12-07-2011  . Anemia   . GERD (gastroesophageal reflux disease)   . Heart murmur   . Osteoporosis   . PONV (postoperative nausea and vomiting)   . Dysrhythmia     occ palpitations   . Sleep apnea     no CPAP machine , borderine   . Headache(784.0)     occasional   . Osteoarthritis     osteoarthritis   . Morbid obesity   . Acute urinary retention s/p Foley 01/07/2012  . Hemorrhoids, internal, with bleeding & prolapse 12/13/2011  . Myocardial infarction     unsure when; per cardiologist report  . Chronic headaches     Medications:  Prescriptions prior to admission  Medication Sig Dispense Refill  . albuterol (PROVENTIL HFA;VENTOLIN HFA) 108 (90 BASE) MCG/ACT inhaler Inhale 2 puffs into the lungs every 6 (six) hours as needed. Wheezing  3.7 g  5  . alum & mag hydroxide-simeth (MYLANTA DOUBLE-STRENGTH) 400-400-40 MG/5ML suspension Take 10 mLs by mouth every 6 (six) hours as needed (for cramping or bloating).  355 mL  0  .  Calcium Citrate-Vitamin D (CITRACAL/VITAMIN D) 250-200 MG-UNIT TABS Take 2 tablets by mouth daily.  90 each  4  . cycloSPORINE (RESTASIS) 0.05 % ophthalmic emulsion Place 1 drop into both eyes 2 (two) times daily.      . diclofenac sodium (VOLTAREN) 1 % GEL Apply 2 g topically 2 (two) times daily as needed. TOPICAL TO KNEE, SHOULDER, WRIST AND ANKLE FOR PAIN  1 Tube  0  . diphenhydrAMINE (BENADRYL) 25 mg capsule Take 1 capsule (25 mg total) by mouth every 4 (four) hours as needed for itching.  24 capsule  2  . fluticasone (VERAMYST) 27.5 MCG/SPRAY nasal spray Place 2 sprays into the nose daily.      . hydrochlorothiazide (HYDRODIURIL) 12.5 MG tablet Take 1 tablet (12.5 mg total) by mouth daily.  30 tablet  6  . hydrOXYzine (ATARAX/VISTARIL) 10 MG tablet Take 1 tablet (10 mg total) by mouth 3 (three) times daily as needed for itching.  60 tablet  0  . Iron-Vitamin C 100-250 MG TABS Take 1 tablet by mouth daily.  60 each  2  . Multiple Vitamin (MULITIVITAMIN WITH MINERALS) TABS Take 1 tablet by mouth 3 (three) times a week.       Marland Kitchen NEXIUM 40 MG capsule TAKE ONE CAPSULE BY MOUTH EVERY DAY  30 capsule  1  . Polyethyl Glycol-Propyl Glycol (SYSTANE FREE OP) Place 1 drop into both eyes daily.      Marland Kitchen senna-docusate (SENOKOT-S) 8.6-50 MG per tablet Take 1 tablet by mouth daily.  30 tablet  5  . triamcinolone cream (KENALOG) 0.1 % Apply on affected skin 2 times a day as needed  85.2 g  1  . valsartan (DIOVAN) 320 MG tablet Take 1 tablet (320 mg total) by mouth daily.  120 tablet  3    Assessment: 58 y.o. female with chest pain for heparin  Goal of Therapy:  Heparin level 0.3-0.7 units/ml Monitor platelets by anticoagulation protocol: Yes   Plan:  Heparin 4000 units IV bolus, then 1600 units/hr Follow-up am labs.  Caryl Pina 01/01/2014,11:23 PM

## 2014-01-02 ENCOUNTER — Observation Stay (HOSPITAL_COMMUNITY): Payer: Medicare Other

## 2014-01-02 ENCOUNTER — Encounter (HOSPITAL_COMMUNITY): Payer: Self-pay | Admitting: *Deleted

## 2014-01-02 DIAGNOSIS — R072 Precordial pain: Secondary | ICD-10-CM

## 2014-01-02 DIAGNOSIS — J45909 Unspecified asthma, uncomplicated: Secondary | ICD-10-CM

## 2014-01-02 DIAGNOSIS — I10 Essential (primary) hypertension: Secondary | ICD-10-CM

## 2014-01-02 DIAGNOSIS — E785 Hyperlipidemia, unspecified: Secondary | ICD-10-CM

## 2014-01-02 DIAGNOSIS — M25511 Pain in right shoulder: Secondary | ICD-10-CM | POA: Diagnosis present

## 2014-01-02 DIAGNOSIS — G4733 Obstructive sleep apnea (adult) (pediatric): Secondary | ICD-10-CM

## 2014-01-02 DIAGNOSIS — M25519 Pain in unspecified shoulder: Secondary | ICD-10-CM

## 2014-01-02 LAB — CBC
HEMATOCRIT: 33.1 % — AB (ref 36.0–46.0)
Hemoglobin: 11.3 g/dL — ABNORMAL LOW (ref 12.0–15.0)
MCH: 27.4 pg (ref 26.0–34.0)
MCHC: 34.1 g/dL (ref 30.0–36.0)
MCV: 80.1 fL (ref 78.0–100.0)
Platelets: 242 10*3/uL (ref 150–400)
RBC: 4.13 MIL/uL (ref 3.87–5.11)
RDW: 15.8 % — AB (ref 11.5–15.5)
WBC: 6.7 10*3/uL (ref 4.0–10.5)

## 2014-01-02 LAB — HEMOGLOBIN A1C
Hgb A1c MFr Bld: 5.1 % (ref <5.7)
Mean Plasma Glucose: 100 mg/dL (ref <117)

## 2014-01-02 LAB — TROPONIN I
Troponin I: <0.3 ng/mL (ref <0.30)
Troponin I: <0.3 ng/mL (ref <0.30)
Troponin I: <0.3 ng/mL (ref <0.30)

## 2014-01-02 LAB — HIV ANTIBODY (ROUTINE TESTING W REFLEX): HIV: NONREACTIVE

## 2014-01-02 LAB — HEPARIN LEVEL (UNFRACTIONATED)
HEPARIN UNFRACTIONATED: 0.29 [IU]/mL — AB (ref 0.30–0.70)
Heparin Unfractionated: 0.19 IU/mL — ABNORMAL LOW (ref 0.30–0.70)

## 2014-01-02 MED ORDER — OXYCODONE HCL 5 MG PO TABS
10.0000 mg | ORAL_TABLET | Freq: Four times a day (QID) | ORAL | Status: DC | PRN
  Administered 2014-01-02: 5 mg via ORAL
  Filled 2014-01-02: qty 2

## 2014-01-02 MED ORDER — REGADENOSON 0.4 MG/5ML IV SOLN
0.4000 mg | Freq: Once | INTRAVENOUS | Status: AC
  Administered 2014-01-02: 0.4 mg via INTRAVENOUS

## 2014-01-02 MED ORDER — ONDANSETRON 4 MG PO TBDP
4.0000 mg | ORAL_TABLET | Freq: Once | ORAL | Status: AC
  Administered 2014-01-02: 4 mg via ORAL
  Filled 2014-01-02: qty 1

## 2014-01-02 MED ORDER — HYDROCHLOROTHIAZIDE 12.5 MG PO CAPS
12.5000 mg | ORAL_CAPSULE | Freq: Every day | ORAL | Status: DC
  Administered 2014-01-02 – 2014-01-05 (×3): 12.5 mg via ORAL
  Filled 2014-01-02 (×5): qty 1

## 2014-01-02 MED ORDER — TECHNETIUM TC 99M SESTAMIBI GENERIC - CARDIOLITE
30.0000 | Freq: Once | INTRAVENOUS | Status: AC | PRN
  Administered 2014-01-02: 30 via INTRAVENOUS

## 2014-01-02 MED ORDER — REGADENOSON 0.4 MG/5ML IV SOLN
INTRAVENOUS | Status: AC
  Administered 2014-01-02: 0.4 mg via INTRAVENOUS
  Filled 2014-01-02: qty 5

## 2014-01-02 MED ORDER — MORPHINE SULFATE 2 MG/ML IJ SOLN: 2.0000 mg | INTRAMUSCULAR | Status: DC | PRN

## 2014-01-02 MED ORDER — OXYCODONE HCL 5 MG PO TABS
10.0000 mg | ORAL_TABLET | Freq: Once | ORAL | Status: AC
  Administered 2014-01-02: 10 mg via ORAL
  Filled 2014-01-02: qty 2

## 2014-01-02 NOTE — Progress Notes (Signed)
Subjective: Denies any current chest pain.  Does report continued right arm pain.  Reports has had multiple knee operation with orthopaedic Sx, has been told before her left arm "is hanging on by a thread," and that she needs both shoulders replaced. Objective: Vital signs in last 24 hours: Filed Vitals:   01/02/14 1036 01/02/14 1038 01/02/14 1040 01/02/14 1042  BP: 146/57 137/64 140/66 147/70  Pulse:      Temp:      TempSrc:      Resp:      Height:      Weight:      SpO2:       Weight change:  No intake or output data in the 24 hours ending 01/02/14 1103 General: sitting on edge of bed HEENT: EOMI, no scleral icterus Cardiac: RRR, no rubs, murmurs or gallops, chest non tender to palpation. Pulm: clear to auscultation bilaterally, moving normal volumes of air Abd: soft, nontender, nondistended, BS present Ext: warm and well perfused, trace pedal edema MSK: Can abduct arm to 95 degrees bilaterally, has tenderness of greater tubercle of right humerus. Neuro: alert and oriented X4  Lab Results: Basic Metabolic Panel:  Recent Labs Lab 01/01/14 2020  NA 141  K 3.9  CL 101  CO2 26  GLUCOSE 129*  BUN 12  CREATININE 0.96  CALCIUM 9.3   CBC:  Recent Labs Lab 01/01/14 2020 01/02/14 0910  WBC 7.6 6.7  HGB 11.9* 11.3*  HCT 34.6* 33.1*  MCV 79.9 80.1  PLT 263 242   Cardiac Enzymes:  Recent Labs Lab 01/02/14 0457 01/02/14 0910  TROPONINI <0.30 <0.30   Micro Results: No results found for this or any previous visit (from the past 240 hour(s)). Studies/Results: Dg Chest 2 View  01/01/2014   CLINICAL DATA:  Severe chest pain beginning 3 days ago.  EXAM: CHEST  2 VIEW  COMPARISON:  PA and lateral chest 06/11/2012.  FINDINGS: Lungs clear. Heart size normal. No pneumothorax or pleural effusion.  IMPRESSION: No acute disease.   Electronically Signed   By: Inge Rise M.D.   On: 01/01/2014 21:23   Medications: I have reviewed the patient's current  medications. Scheduled Meds: . aspirin EC  81 mg Oral Daily  . atorvastatin  80 mg Oral q1800  . calcium-vitamin D  1 tablet Oral Q breakfast  . cycloSPORINE  1 drop Both Eyes BID  . ferrous sulfate  325 mg Oral Q breakfast  . fluticasone  2 spray Each Nare Daily  . hydrochlorothiazide  12.5 mg Oral Daily  . influenza vac split quadrivalent PF  0.5 mL Intramuscular Tomorrow-1000  . irbesartan  300 mg Oral Daily  . metoprolol tartrate  25 mg Oral BID  . multivitamin with minerals  1 tablet Oral Once per day on Mon Wed Fri  . pantoprazole  80 mg Oral Q1200  . pneumococcal 23 valent vaccine  0.5 mL Intramuscular Tomorrow-1000  . senna-docusate  1 tablet Oral Daily  . vitamin C  250 mg Oral Q breakfast   Continuous Infusions: . sodium chloride 75 mL/hr at 01/01/14 2327  . heparin 1,600 Units/hr (01/01/14 2336)   PRN Meds:.acetaminophen, albuterol, alum & mag hydroxide-simeth, morphine injection, nitroGLYCERIN, ondansetron (ZOFRAN) IV Assessment/Plan:    Anginal chest pain at rest Trop negative x3. EKG with T wave flatting.  Patient at intermediated cornary risk will need to risk stratify with further testing.  Plan for lexiscan today and tomorrow to determine coronary risk. - Continue ASA -  Metoprolol - Heparin -NTG SL PRN    HYPERTENSION - HCTZ 12.5 daily - Irbesartan 300mg  daily - Metoprolol 25 bid   Right shoulder pain  Right shoulder pain Likely secondary to OA vs rotator cuff.  Not urgent, pain control with Oxycodone (would need second IV for morphine and patient does not want second IV) - Will need ortho follow up as outpatient. -Oxycodone 10mg  q6prn  HLD - Continue Atorvastatin  Asthma No acute issues Albuterol PRN  DVT PPx: On full dose heparin Diet: Heart, NPO after midnight  Dispo: Disposition is deferred at this time, awaiting improvement of current medical problems.  Anticipated discharge in approximately 1 day(s).   The patient does have a current PCP  (Madilyn Fireman, MD) and does need an [facility] hospital follow-up appointment after discharge.  The patient does not have transportation limitations that hinder transportation to clinic appointments.  .Services Needed at time of discharge: Y = Yes, Blank = No PT:   OT:   RN:   Equipment:   Other:     LOS: 1 day   Joni Reining, DO 01/02/2014, 11:03 AM

## 2014-01-02 NOTE — H&P (Signed)
  Date: 01/02/2014  Patient name: [name]  Medical record number: [id number]  Date of birth: 1956/05/07   I have seen and evaluated [name] and discussed their care with the Residency Team.   Assessment and Plan: I have seen and evaluated the patient as outlined above. I agree with the formulated Assessment and Plan as detailed in the residents' admission note, with the following changes:   58 yo woman, history of HTN, OSA, obesity who presents with substernal chest pressure that developed in the context of worsening right shoulder pain. The latter complaint is a chronic complaint but it is worsened abruptly on exam she is exquisitely tender to palpation of her right shoulder. She also does complain of chest tightness and this morning we gave her further morphine. We had placed her on heparin, asa beta blocker. Her troponins are negative and she is going for nuclear medicine stress test. Was prev followed by [facility]  Greatly appreciate Cardiology's help here.    [name], [place] 2/11/20151:46 PM

## 2014-01-02 NOTE — Progress Notes (Signed)
UR completed. Patient changed to inpatient- requiring IV heparin and questionable t wave changes on EKG

## 2014-01-02 NOTE — Progress Notes (Signed)
At 0125 pt c/o nausea and some rt shoulder pain. Pt medicated with Zofran. Pt then stated she had some heaviness in her chest. BP 147/82 with HR 71. Pt had 02 in place so 12 lead EKG done. Pt also given 1 SL NTG. At 0153 Pt states chest heaviness gone BP 120/78 with HR 76. Continues with rt shoulder pain. Dr. Denton Brick notified of CP/ Rt shoulder pain and steps taken and results of the EKG. No new orders at this time. Will cont to monitor.

## 2014-01-02 NOTE — Progress Notes (Signed)
Part one of two day Lexiscan. Some nausea but otherwise tolerated well. Will review with MD.  Kerin Ransom PA-C 01/02/2014 10:41 AM Daryel November, MD

## 2014-01-02 NOTE — Progress Notes (Signed)
ANTICOAGULATION CONSULT NOTE - Follow Up Consult  Pharmacy Consult for Heparin Indication: chest pain/ACS  Allergies  Allergen Reactions  . Cephalosporins Anaphylaxis    Tongue swelling, gum pain  . Codeine Other (See Comments)    hallucinations  . Latex Itching  . Penicillins     Pt reports no longer allergic  . Prednisone     Heart beating fast       Patient Measurements: Height: 5\' 8"  (172.7 cm) Weight: 319 lb 14.4 oz (145.106 kg) IBW/kg (Calculated) : 63.9 Heparin Dosing Weight: 100 kg  Vital Signs: Temp: 98.1 F (36.7 C) (02/11 0458) Temp src: Oral (02/11 0458) BP: 140/66 mmHg (02/11 1040) Pulse Rate: 69 (02/11 0458)  Labs:  Recent Labs  01/01/14 2020 01/02/14 0457 01/02/14 0910  HGB 11.9*  --  11.3*  HCT 34.6*  --  33.1*  PLT 263  --  242  HEPARINUNFRC  --   --  0.19*  CREATININE 0.96  --   --   TROPONINI  --  <0.30 <0.30    Estimated Creatinine Clearance: 96 ml/min (by C-G formula based on Cr of 0.96).   Medications:  Scheduled:  . aspirin EC  81 mg Oral Daily  . atorvastatin  80 mg Oral q1800  . calcium-vitamin D  1 tablet Oral Q breakfast  . cycloSPORINE  1 drop Both Eyes BID  . ferrous sulfate  325 mg Oral Q breakfast  . fluticasone  2 spray Each Nare Daily  . hydrochlorothiazide  12.5 mg Oral Daily  . influenza vac split quadrivalent PF  0.5 mL Intramuscular Tomorrow-1000  . irbesartan  300 mg Oral Daily  . metoprolol tartrate  25 mg Oral BID  . multivitamin with minerals  1 tablet Oral Once per day on Mon Wed Fri  . pantoprazole  80 mg Oral Q1200  . pneumococcal 23 valent vaccine  0.5 mL Intramuscular Tomorrow-1000  . senna-docusate  1 tablet Oral Daily  . vitamin C  250 mg Oral Q breakfast   Infusions:  . sodium chloride 75 mL/hr at 01/01/14 2327  . heparin 1,600 Units/hr (01/01/14 2336)    Assessment: 58 yo F presented with CP and started on IV heparin.  Plan for 2d stress test.  Heparin level=0.19 on 1600 units/hr.  No IV  issues noted.  Goal of Therapy:  Heparin level 0.3-0.7 units/ml Monitor platelets by anticoagulation protocol: Yes   Plan:  Increase Heparin to 2000 units/hr. Heparin level in 6 hours.  [employer], Pharm.D., BCPS Clinical Pharmacist Pager [phone] 01/02/2014 11:02 AM

## 2014-01-02 NOTE — Progress Notes (Signed)
ANTICOAGULATION CONSULT NOTE - Follow Up Consult  Pharmacy Consult for Heparin Indication: chest pain/ACS  Allergies  Allergen Reactions  . Cephalosporins Anaphylaxis    Tongue swelling, gum pain  . Codeine Other (See Comments)    hallucinations  . Latex Itching  . Penicillins     Pt reports no longer allergic  . Prednisone     Heart beating fast       Patient Measurements: Height: 5\' 8"  (172.7 cm) Weight: 319 lb 14.4 oz (145.106 kg) IBW/kg (Calculated) : 63.9 Heparin Dosing Weight: 100 kg  Vital Signs: Temp: 97.8 F (36.6 C) (02/11 1944) Temp src: Oral (02/11 1944) BP: 136/58 mmHg (02/11 1944) Pulse Rate: 64 (02/11 1944)  Labs:  Recent Labs  01/01/14 2020  01/02/14 0910 01/02/14 1256 01/02/14 1715 01/02/14 1910  HGB 11.9*  --  11.3*  --   --   --   HCT 34.6*  --  33.1*  --   --   --   PLT 263  --  242  --   --   --   HEPARINUNFRC  --   --  0.19*  --   --  0.29*  CREATININE 0.96  --   --   --   --   --   TROPONINI  --   < > <0.30 <0.30 <0.30  --   < > = values in this interval not displayed.  Estimated Creatinine Clearance: 96 ml/min (by C-G formula based on Cr of 0.96).   Medications:  Scheduled:  . aspirin EC  81 mg Oral Daily  . atorvastatin  80 mg Oral q1800  . calcium-vitamin D  1 tablet Oral Q breakfast  . cycloSPORINE  1 drop Both Eyes BID  . ferrous sulfate  325 mg Oral Q breakfast  . fluticasone  2 spray Each Nare Daily  . hydrochlorothiazide  12.5 mg Oral Daily  . influenza vac split quadrivalent PF  0.5 mL Intramuscular Tomorrow-1000  . irbesartan  300 mg Oral Daily  . metoprolol tartrate  25 mg Oral BID  . multivitamin with minerals  1 tablet Oral Once per day on Mon Wed Fri  . pantoprazole  80 mg Oral Q1200  . pneumococcal 23 valent vaccine  0.5 mL Intramuscular Tomorrow-1000  . senna-docusate  1 tablet Oral Daily  . vitamin C  250 mg Oral Q breakfast   Infusions:  . sodium chloride 75 mL/hr at 01/02/14 1305  . heparin 2,000  Units/hr (01/02/14 1340)    Assessment: 58 yo F presented with CP and started on IV heparin.  Plan for 2d stress test.   PM heparin level = 0.29   Goal of Therapy:  Heparin level 0.3-0.7 units/ml Monitor platelets by anticoagulation protocol: Yes   Plan:  Increase Heparin to 2150 units/hr. Follow up AM labs.  Thank you. Anette Guarneri, PharmD [phone]  01/02/2014 8:40 PM

## 2014-01-03 ENCOUNTER — Observation Stay (HOSPITAL_COMMUNITY): Payer: Medicare Other

## 2014-01-03 DIAGNOSIS — R079 Chest pain, unspecified: Secondary | ICD-10-CM

## 2014-01-03 DIAGNOSIS — I209 Angina pectoris, unspecified: Secondary | ICD-10-CM

## 2014-01-03 DIAGNOSIS — R9439 Abnormal result of other cardiovascular function study: Secondary | ICD-10-CM

## 2014-01-03 DIAGNOSIS — I208 Other forms of angina pectoris: Secondary | ICD-10-CM

## 2014-01-03 DIAGNOSIS — I2089 Other forms of angina pectoris: Secondary | ICD-10-CM

## 2014-01-03 LAB — CBC
HCT: 32.1 % — ABNORMAL LOW (ref 36.0–46.0)
Hemoglobin: 10.6 g/dL — ABNORMAL LOW (ref 12.0–15.0)
MCH: 26.8 pg (ref 26.0–34.0)
MCHC: 33 g/dL (ref 30.0–36.0)
MCV: 81.1 fL (ref 78.0–100.0)
Platelets: 226 10*3/uL (ref 150–400)
RBC: 3.96 MIL/uL (ref 3.87–5.11)
RDW: 16 % — ABNORMAL HIGH (ref 11.5–15.5)
WBC: 7.6 10*3/uL (ref 4.0–10.5)

## 2014-01-03 LAB — HEPARIN LEVEL (UNFRACTIONATED): HEPARIN UNFRACTIONATED: 0.34 [IU]/mL (ref 0.30–0.70)

## 2014-01-03 MED ORDER — SODIUM CHLORIDE 0.9 % IV SOLN: 250.0000 mL | INTRAVENOUS | Status: DC | PRN

## 2014-01-03 MED ORDER — OXYCODONE HCL 5 MG PO TABS
5.0000 mg | ORAL_TABLET | Freq: Four times a day (QID) | ORAL | Status: DC | PRN
  Administered 2014-01-03 – 2014-01-04 (×3): 5 mg via ORAL
  Filled 2014-01-03 (×5): qty 1

## 2014-01-03 MED ORDER — ASPIRIN 81 MG PO CHEW
81.0000 mg | CHEWABLE_TABLET | ORAL | Status: AC
  Administered 2014-01-04: 81 mg via ORAL
  Filled 2014-01-03: qty 1

## 2014-01-03 MED ORDER — SODIUM CHLORIDE 0.9 % IJ SOLN: 3.0000 mL | INTRAMUSCULAR | Status: DC | PRN

## 2014-01-03 MED ORDER — SODIUM CHLORIDE 0.9 % IV SOLN
INTRAVENOUS | Status: DC
  Administered 2014-01-04: 04:00:00 via INTRAVENOUS

## 2014-01-03 MED ORDER — TECHNETIUM TC 99M SESTAMIBI - CARDIOLITE
30.0000 | Freq: Once | INTRAVENOUS | Status: AC | PRN
  Administered 2014-01-03: 30 via INTRAVENOUS

## 2014-01-03 MED ORDER — SODIUM CHLORIDE 0.9 % IJ SOLN
3.0000 mL | Freq: Two times a day (BID) | INTRAMUSCULAR | Status: DC
  Administered 2014-01-03: 3 mL via INTRAVENOUS

## 2014-01-03 NOTE — Progress Notes (Signed)
ANTICOAGULATION CONSULT NOTE - Follow Up Consult  Pharmacy Consult for Heparin Indication: chest pain/ACS  Allergies  Allergen Reactions  . Cephalosporins Anaphylaxis    Tongue swelling, gum pain  . Codeine Other (See Comments)    hallucinations  . Latex Itching  . Penicillins     Pt reports no longer allergic  . Prednisone     Heart beating fast       Patient Measurements: Height: 5\' 8"  (172.7 cm) Weight: 319 lb 14.4 oz (145.106 kg) IBW/kg (Calculated) : 63.9 Heparin Dosing Weight: 100 kg  Vital Signs: Temp: 98.1 F (36.7 C) (02/12 0619) Temp src: Oral (02/12 0619) BP: 150/73 mmHg (02/12 1036) Pulse Rate: 62 (02/12 1036)  Labs:  Recent Labs  01/01/14 2020  01/02/14 0910 01/02/14 1256 01/02/14 1715 01/02/14 1910 01/02/14 2245 01/03/14 0355  HGB 11.9*  --  11.3*  --   --   --   --  10.6*  HCT 34.6*  --  33.1*  --   --   --   --  32.1*  PLT 263  --  242  --   --   --   --  226  HEPARINUNFRC  --   --  0.19*  --   --  0.29*  --  0.34  CREATININE 0.96  --   --   --   --   --   --   --   TROPONINI  --   < > <0.30 <0.30 <0.30  --  <0.30  --   < > = values in this interval not displayed.  Estimated Creatinine Clearance: 96 ml/min (by C-G formula based on Cr of 0.96).   Medications:  Scheduled:  . aspirin EC  81 mg Oral Daily  . atorvastatin  80 mg Oral q1800  . calcium-vitamin D  1 tablet Oral Q breakfast  . cycloSPORINE  1 drop Both Eyes BID  . ferrous sulfate  325 mg Oral Q breakfast  . fluticasone  2 spray Each Nare Daily  . hydrochlorothiazide  12.5 mg Oral Daily  . irbesartan  300 mg Oral Daily  . metoprolol tartrate  25 mg Oral BID  . multivitamin with minerals  1 tablet Oral Once per day on Mon Wed Fri  . pantoprazole  80 mg Oral Q1200  . pneumococcal 23 valent vaccine  0.5 mL Intramuscular Tomorrow-1000  . senna-docusate  1 tablet Oral Daily  . vitamin C  250 mg Oral Q breakfast   Infusions:  . sodium chloride 75 mL/hr at 01/03/14 0110  .  heparin 2,150 Units/hr (01/03/14 0356)    Assessment: 58 yo F presented with CP and started on IV heparin.  On day 2 of 2d stress test.  Heparin level=0.34 on 2150 units/hr.  No IV issues noted.  Goal of Therapy:  Heparin level 0.3-0.7 units/ml Monitor platelets by anticoagulation protocol: Yes   Plan:  Continue Heparin at 2150 units/hr. Follow up results of stress test.  Horton Chin, Pharm.D., BCPS Clinical Pharmacist Pager [phone] 01/03/2014 12:31 PM

## 2014-01-03 NOTE — Progress Notes (Addendum)
Internal Medicine Teaching Service Daily Progress Note  Subjective: No current chest pain, did report some chest discomfort this AM when she went to bathroom, did not tell nurse, was instructed to do so if she has chest pain.  Reports R shoulder pain improved with oxycodone. Objective: Vital signs in last 24 hours: Filed Vitals:   01/02/14 1944 01/02/14 2121 01/03/14 0619 01/03/14 1036  BP: 136/58 134/40 119/46 150/73  Pulse: 64 70 70 62  Temp: 97.8 F (36.6 C)  98.1 F (36.7 C)   TempSrc: Oral  Oral   Resp: 18     Height:      Weight:      SpO2: 100%  100%    Weight change:   Intake/Output Summary (Last 24 hours) at 01/03/14 1138 Last data filed at 01/03/14 0830  Gross per 24 hour  Intake    600 ml  Output      0 ml  Net    600 ml   General: resting in bed, NAD Cardiac: RRR, no rubs, murmurs or gallops Pulm: clear to auscultation bilaterally, moving normal volumes of air Abd: obese, soft, nontender, nondistended, BS present Ext: warm and well perfused, trace pedal edema Neuro: alert and oriented X4  Lab Results: Basic Metabolic Panel:  Recent Labs Lab 01/01/14 2020  NA 141  K 3.9  CL 101  CO2 26  GLUCOSE 129*  BUN 12  CREATININE 0.96  CALCIUM 9.3   CBC:  Recent Labs Lab 01/02/14 0910 01/03/14 0355  WBC 6.7 7.6  HGB 11.3* 10.6*  HCT 33.1* 32.1*  MCV 80.1 81.1  PLT 242 226   Cardiac Enzymes:  Recent Labs Lab 01/02/14 1256 01/02/14 1715 01/02/14 2245  TROPONINI <0.30 <0.30 <0.30   Micro Results: No results found for this or any previous visit (from the past 240 hour(s)). Studies/Results: Dg Chest 2 View  01/01/2014   CLINICAL DATA:  Severe chest pain beginning 3 days ago.  EXAM: CHEST  2 VIEW  COMPARISON:  PA and lateral chest 06/11/2012.  FINDINGS: Lungs clear. Heart size normal. No pneumothorax or pleural effusion.  IMPRESSION: No acute disease.   Electronically Signed   By: Inge Rise M.D.   On: 01/01/2014 21:23   Medications: I have  reviewed the patient's current medications. Scheduled Meds: . aspirin EC  81 mg Oral Daily  . atorvastatin  80 mg Oral q1800  . calcium-vitamin D  1 tablet Oral Q breakfast  . cycloSPORINE  1 drop Both Eyes BID  . ferrous sulfate  325 mg Oral Q breakfast  . fluticasone  2 spray Each Nare Daily  . hydrochlorothiazide  12.5 mg Oral Daily  . irbesartan  300 mg Oral Daily  . metoprolol tartrate  25 mg Oral BID  . multivitamin with minerals  1 tablet Oral Once per day on Mon Wed Fri  . pantoprazole  80 mg Oral Q1200  . pneumococcal 23 valent vaccine  0.5 mL Intramuscular Tomorrow-1000  . senna-docusate  1 tablet Oral Daily  . vitamin C  250 mg Oral Q breakfast   Continuous Infusions: . sodium chloride 75 mL/hr at 01/03/14 0110  . heparin 2,150 Units/hr (01/03/14 0356)   PRN Meds:.acetaminophen, albuterol, alum & mag hydroxide-simeth, nitroGLYCERIN, ondansetron (ZOFRAN) IV, oxyCODONE Assessment/Plan:    Anginal chest pain at rest Follow up Lexiscan results, if high risk will need cardiac cath, if negative can likely D/C home today. - Continue ASA - Metoprolol - Heparin -NTG SL PRN  HYPERTENSION - HCTZ 12.5 daily - Irbesartan 300mg  daily - Metoprolol 25 bid  Right shoulder pain Likely secondary to OA vs rotator cuff.  Not urgent, pain control with Oxycodone (would need second IV for morphine and patient does not want second IV) - Will need ortho follow up as outpatient. -Oxycodone 5mg  q6prn  HLD - Continue Atorvastatin  Asthma No acute issues Albuterol PRN  DVT PPx: On full dose heparin Diet: Heart  Dispo: Disposition is deferred at this time, awaiting improvement of current medical problems. Possible discharge today pending results of Lexiscan  The patient does have a current PCP (Madilyn Fireman, MD) and does need an [facility] hospital follow-up appointment after discharge.  The patient does not have transportation limitations that hinder transportation to clinic  appointments.  .Services Needed at time of discharge: Y = Yes, Blank = No PT:   OT:   RN:   Equipment:   Other:     LOS: 2 days   Joni Reining, DO 01/03/2014, 11:38 AM    Date: 01/03/2014  Patient name: [name]  Medical record number: [id number]  Date of birth: [birth year]   This patient has been seen and the plan of care was discussed with the house staff. Please see their note for complete details. I concur with their findings with the following additions/corrections:  We'll followup results from the Myoview study. She seems in general to be doing better today her shoulder pain as well as better she needs to be evaluated by her orthopedic surgeon Adriana Mccallum but this can be done as an oupatient.   Truman Hayward, MD 01/03/2014, 12:07 PM

## 2014-01-03 NOTE — Consult Note (Signed)
Reason for Consult: Positive NST.   Referring Physician: Tommy Medal  ADLEE PAAR is an 58 y.o. female.  HPI:  The patient is a 58 yo morbidly obese female with a history of difficult to control HTN, HLD, DM, GERD, OSA, uterine bleeding.  She presented with "squeezing" chest pain which was severe on Saturday.  Associated with nausea, SOB, and maybe right shoulder/arm pain.  She had a nuclear stress test which was positive for ischemia of the mid and basilar segments of the inferolateral and lateral wall.  She is on heparin.  EKG with TWI in inferior/lateral leads.  Currently pain free but had pain at 0730hrs this morning.    Past Medical History  Diagnosis Date  . Seasonal allergies   . Asthma     History of  . Hypertension   . Iron deficiency 12-07-2011  . Anemia   . GERD (gastroesophageal reflux disease)   . Heart murmur   . Osteoporosis   . PONV (postoperative nausea and vomiting)   . Dysrhythmia     occ palpitations   . Sleep apnea     no CPAP machine , borderine   . Headache(784.0)     occasional   . Osteoarthritis     osteoarthritis   . Morbid obesity   . Acute urinary retention s/p Foley 01/07/2012  . Hemorrhoids, internal, with bleeding & prolapse 12/13/2011  . Myocardial infarction     unsure when; per cardiologist report  . Chronic headaches     Past Surgical History  Procedure Laterality Date  . Knee surgery      Both knees  . Total knee arthroplasty  04/29/2011    Left knee  . Multiple tooth extractions    . Wisdom tooth extraction    . Colonoscopy  2009  . Knee arthroscopy  1991  . Transanal hemorrhoidal dearterliaization  01/06/12    with external hemorrhoid removal    Family History  Problem Relation Age of Onset  . Colon cancer Neg Hx   . Diabetes Mother   . Hypertension Mother   . Heart disease Sister     Congestive heart failure  . Diabetes type II Sister   . Kidney disease Brother   . Heart attack Mother 7    Social History:  reports  that she has never smoked. She has never used smokeless tobacco. She reports that she does not drink alcohol or use illicit drugs.  Allergies:  Allergies  Allergen Reactions  . Cephalosporins Anaphylaxis    Tongue swelling, gum pain  . Codeine Other (See Comments)    hallucinations  . Latex Itching  . Penicillins     Pt reports no longer allergic  . Prednisone     Heart beating fast       Medications:  Scheduled Meds: . aspirin EC  81 mg Oral Daily  . atorvastatin  80 mg Oral q1800  . calcium-vitamin D  1 tablet Oral Q breakfast  . cycloSPORINE  1 drop Both Eyes BID  . ferrous sulfate  325 mg Oral Q breakfast  . fluticasone  2 spray Each Nare Daily  . hydrochlorothiazide  12.5 mg Oral Daily  . irbesartan  300 mg Oral Daily  . metoprolol tartrate  25 mg Oral BID  . multivitamin with minerals  1 tablet Oral Once per day on Mon Wed Fri  . pantoprazole  80 mg Oral Q1200  . pneumococcal 23 valent vaccine  0.5 mL Intramuscular Tomorrow-1000  .  senna-docusate  1 tablet Oral Daily  . vitamin C  250 mg Oral Q breakfast   Continuous Infusions: . sodium chloride 75 mL/hr at 01/03/14 1529  . heparin 2,150 Units/hr (01/03/14 0356)   PRN Meds:.acetaminophen, albuterol, alum & mag hydroxide-simeth, nitroGLYCERIN, ondansetron (ZOFRAN) IV, oxyCODONE   Results for orders placed during the hospital encounter of 01/01/14 (from the past 48 hour(s))  CBC     Status: Abnormal   Collection Time    01/01/14  8:20 PM      Result Value Ref Range   WBC 7.6  4.0 - 10.5 K/uL   RBC 4.33  3.87 - 5.11 MIL/uL   Hemoglobin 11.9 (*) 12.0 - 15.0 g/dL   HCT 34.6 (*) 36.0 - 46.0 %   MCV 79.9  78.0 - 100.0 fL   MCH 27.5  26.0 - 34.0 pg   MCHC 34.4  30.0 - 36.0 g/dL   RDW 15.4  11.5 - 15.5 %   Platelets 263  150 - 400 K/uL  BASIC METABOLIC PANEL     Status: Abnormal   Collection Time    01/01/14  8:20 PM      Result Value Ref Range   Sodium 141  137 - 147 mEq/L   Potassium 3.9  3.7 - 5.3 mEq/L     Chloride 101  96 - 112 mEq/L   CO2 26  19 - 32 mEq/L   Glucose, Bld 129 (*) 70 - 99 mg/dL   BUN 12  6 - 23 mg/dL   Creatinine, Ser 0.96  0.50 - 1.10 mg/dL   Calcium 9.3  8.4 - 10.5 mg/dL   GFR calc non Af Amer 63 (*) >90 mL/min   GFR calc Af Amer 74 (*) >90 mL/min   Comment: (NOTE)     The eGFR has been calculated using the CKD EPI equation.     This calculation has not been validated in all clinical situations.     eGFR's persistently <90 mL/min signify possible Chronic Kidney     Disease.  POCT I-STAT TROPONIN I     Status: None   Collection Time    01/01/14  8:27 PM      Result Value Ref Range   Troponin i, poc 0.01  0.00 - 0.08 ng/mL   Comment 3            Comment: Due to the release kinetics of cTnI,     a negative result within the first hours     of the onset of symptoms does not rule out     myocardial infarction with certainty.     If myocardial infarction is still suspected,     repeat the test at appropriate intervals.  TROPONIN I     Status: None   Collection Time    01/02/14  4:57 AM      Result Value Ref Range   Troponin I <0.30  <0.30 ng/mL   Comment:            Due to the release kinetics of cTnI,     a negative result within the first hours     of the onset of symptoms does not rule out     myocardial infarction with certainty.     If myocardial infarction is still suspected,     repeat the test at appropriate intervals.  HEMOGLOBIN A1C     Status: None   Collection Time    01/02/14  4:57 AM  Result Value Ref Range   Hemoglobin A1C 5.1  <5.7 %   Comment: (NOTE)                                                                               According to the ADA Clinical Practice Recommendations for 2011, when     HbA1c is used as a screening test:      >=6.5%   Diagnostic of Diabetes Mellitus               (if abnormal result is confirmed)     5.7-6.4%   Increased risk of developing Diabetes Mellitus     References:Diagnosis and Classification of  Diabetes Mellitus,Diabetes     POEU,2353,61(WERXV 1):S62-S69 and Standards of Medical Care in             Diabetes - 2011,Diabetes QMGQ,6761,95 (Suppl 1):S11-S61.   Mean Plasma Glucose 100  <117 mg/dL   Comment: Performed at [facility]  TROPONIN I     Status: None   Collection Time    01/02/14  9:10 AM      Result Value Ref Range   Troponin I <0.30  <0.30 ng/mL   Comment:            Due to the release kinetics of cTnI,     a negative result within the first hours     of the onset of symptoms does not rule out     myocardial infarction with certainty.     If myocardial infarction is still suspected,     repeat the test at appropriate intervals.  HEPARIN LEVEL (UNFRACTIONATED)     Status: Abnormal   Collection Time    01/02/14  9:10 AM      Result Value Ref Range   Heparin Unfractionated 0.19 (*) 0.30 - 0.70 IU/mL   Comment:            IF HEPARIN RESULTS ARE BELOW     EXPECTED VALUES, AND PATIENT     DOSAGE HAS BEEN CONFIRMED,     SUGGEST FOLLOW UP TESTING     OF ANTITHROMBIN III LEVELS.  CBC     Status: Abnormal   Collection Time    01/02/14  9:10 AM      Result Value Ref Range   WBC 6.7  4.0 - 10.5 K/uL   RBC 4.13  3.87 - 5.11 MIL/uL   Hemoglobin 11.3 (*) 12.0 - 15.0 g/dL   HCT 33.1 (*) 36.0 - 46.0 %   MCV 80.1  78.0 - 100.0 fL   MCH 27.4  26.0 - 34.0 pg   MCHC 34.1  30.0 - 36.0 g/dL   RDW 15.8 (*) 11.5 - 15.5 %   Platelets 242  150 - 400 K/uL  HIV ANTIBODY (ROUTINE TESTING)     Status: None   Collection Time    01/02/14  9:10 AM      Result Value Ref Range   HIV NON REACTIVE  NON REACTIVE   Comment: Performed at [facility]  TROPONIN I     Status: None   Collection Time    01/02/14 12:56 PM      Result Value  Ref Range   Troponin I <0.30  <0.30 ng/mL   Comment:            Due to the release kinetics of cTnI,     a negative result within the first hours     of the onset of symptoms does not rule out     myocardial infarction with certainty.      If myocardial infarction is still suspected,     repeat the test at appropriate intervals.  TROPONIN I     Status: None   Collection Time    01/02/14  5:15 PM      Result Value Ref Range   Troponin I <0.30  <0.30 ng/mL   Comment:            Due to the release kinetics of cTnI,     a negative result within the first hours     of the onset of symptoms does not rule out     myocardial infarction with certainty.     If myocardial infarction is still suspected,     repeat the test at appropriate intervals.  HEPARIN LEVEL (UNFRACTIONATED)     Status: Abnormal   Collection Time    01/02/14  7:10 PM      Result Value Ref Range   Heparin Unfractionated 0.29 (*) 0.30 - 0.70 IU/mL   Comment:            IF HEPARIN RESULTS ARE BELOW     EXPECTED VALUES, AND PATIENT     DOSAGE HAS BEEN CONFIRMED,     SUGGEST FOLLOW UP TESTING     OF ANTITHROMBIN III LEVELS.  TROPONIN I     Status: None   Collection Time    01/02/14 10:45 PM      Result Value Ref Range   Troponin I <0.30  <0.30 ng/mL   Comment:            Due to the release kinetics of cTnI,     a negative result within the first hours     of the onset of symptoms does not rule out     myocardial infarction with certainty.     If myocardial infarction is still suspected,     repeat the test at appropriate intervals.  HEPARIN LEVEL (UNFRACTIONATED)     Status: None   Collection Time    01/03/14  3:55 AM      Result Value Ref Range   Heparin Unfractionated 0.34  0.30 - 0.70 IU/mL   Comment:            IF HEPARIN RESULTS ARE BELOW     EXPECTED VALUES, AND PATIENT     DOSAGE HAS BEEN CONFIRMED,     SUGGEST FOLLOW UP TESTING     OF ANTITHROMBIN III LEVELS.  CBC     Status: Abnormal   Collection Time    01/03/14  3:55 AM      Result Value Ref Range   WBC 7.6  4.0 - 10.5 K/uL   RBC 3.96  3.87 - 5.11 MIL/uL   Hemoglobin 10.6 (*) 12.0 - 15.0 g/dL   HCT 32.1 (*) 36.0 - 46.0 %   MCV 81.1  78.0 - 100.0 fL   MCH 26.8  26.0 - 34.0 pg    MCHC 33.0  30.0 - 36.0 g/dL   RDW 16.0 (*) 11.5 - 15.5 %   Platelets 226  150 - 400 K/uL    Dg Chest  2 View  01/01/2014   CLINICAL DATA:  Severe chest pain beginning 3 days ago.  EXAM: CHEST  2 VIEW  COMPARISON:  PA and lateral chest 06/11/2012.  FINDINGS: Lungs clear. Heart size normal. No pneumothorax or pleural effusion.  IMPRESSION: No acute disease.   Electronically Signed   By: Inge Rise M.D.   On: 01/01/2014 21:23   Nm Myocar Multi W/spect W/wall Motion / Ef  01/03/2014   CLINICAL DATA:  58 year old female with chest pain with  EXAM: MYOCARDIAL IMAGING WITH SPECT (REST AND PHARMACOLOGIC-STRESS - 2 DAY PROTOCOL)  GATED LEFT VENTRICULAR WALL MOTION STUDY  LEFT VENTRICULAR EJECTION FRACTION  TECHNIQUE: Standard myocardial SPECT imaging was performed after resting intravenous injection of 30 mCi Tc-38msestamibi. Subsequently, on a second day, intravenous infusion of Lexiscan was performed under the supervision of the Cardiology staff. At peak effect of the drug, 30 mCi Tc-93mestamibi was injected intravenously and standard myocardial SPECT imaging was performed. Quantitative gated imaging was also performed to evaluate left ventricular wall motion, and estimate left ventricular ejection fraction.  COMPARISON:  None.  FINDINGS: Perfusion: There is some motion degradation on this 2 day study. 5+ breast size additionally.  There are decreased counts within the inferior lateral wall and lateral wall involving the mid and basilar segments which improved from stress to rest. This is concerning for ischemia of the lateral wall.  There is apparent fixed defect within the anterior septal wall apex with mild reversibility which may be due to motion.  Wall Motion: Motion artifact makes evaluation wall motion difficult.  Ejection Fraction: 5273%  End-diastolic volume equals: 11710L Ml  End systolic volume equals: 53 mL Ml  IMPRESSION:  1. Concern for ischemia of the mid and basilar segments of the  inferolateral and lateral wall. 2. Fixed defect with mild reversibility within the anterior septal wall versus motion artifact. 3. Wall motion is difficult to assess due to motion artifact. 4. Left ventricular ejection fraction equals 52% These results will be called to the ordering clinician or representative by the Radiologist Assistant, and communication documented in the PACS Dashboard.   Electronically Signed   By: StSuzy Bouchard.D.   On: 01/03/2014 13:31    Review of Systems  Constitutional: Negative for fever and diaphoresis.  HENT: Negative for congestion and sore throat.   Respiratory: Positive for shortness of breath. Negative for cough.   Cardiovascular: Positive for chest pain ("Squeezing"). Negative for orthopnea, leg swelling and PND.  Gastrointestinal: Positive for nausea. Negative for vomiting, blood in stool and melena.  Musculoskeletal: Positive for myalgias (Right shoulder/triceps region.).  Neurological: Negative for dizziness.  All other systems reviewed and are negative.   Blood pressure 148/77, pulse 73, temperature 98.4 F (36.9 C), temperature source Oral, resp. rate 20, height _0  (1.727 m), weight 319 lb 14.4 oz (145.106 kg), last menstrual period 10/26/2013, SpO2 100.00%. Physical Exam  Constitutional: She is oriented to person, place, and time. She appears well-developed. No distress.  Morbidly obese  HENT:  Head: Normocephalic and atraumatic.  Eyes: EOM are normal. Pupils are equal, round, and reactive to light. No scleral icterus.  Neck: Normal range of motion.  Cardiovascular: Normal rate, regular rhythm, S1 normal and S2 normal.   No murmur heard. Pulses:      Radial pulses are 2+ on the right side, and 2+ on the left side.       Dorsalis pedis pulses are 2+ on the right side, and 2+ on  the left side.  Respiratory: Effort normal and breath sounds normal. She has no wheezes. She has no rales.  GI: Soft. Bowel sounds are normal. She exhibits no  distension. There is no tenderness.  Musculoskeletal: She exhibits no edema.  Lymphadenopathy:    She has no cervical adenopathy.  Neurological: She is alert and oriented to person, place, and time. She exhibits normal muscle tone.  Skin: Skin is warm and dry.  Psychiatric: She has a normal mood and affect.    Assessment/Plan: Principal Problem:   Positive cardiac stress test Active Problems:   SLEEP APNEA, OBSTRUCTIVE, MILD   HYPERTENSION   Anginal chest pain at rest   Right shoulder pain   Morbid obesity  Plan:  She is on heparin, asa, beta blocker, statin and ARB.  Left heart cath tomorrow.  A1C 5.1.  Outpatient nutrition referral.     Tarri Fuller 01/03/2014, 3:53 PM    Agree with note written by Luisa Dago PAC  + CRF, Sx c/w [place], + myoview (2 day protocol) . Exam benign. Pt is morbidly obese. Needs cath radially tomorrow.  Lorretta Harp 01/03/2014 6:22 PM

## 2014-01-04 ENCOUNTER — Encounter (HOSPITAL_COMMUNITY)
Admission: EM | Disposition: A | Discharge status: Skilled Nursing Facility | Payer: Self-pay | Source: Home / Self Care | Attending: Cardiothoracic Surgery

## 2014-01-04 DIAGNOSIS — I2510 Atherosclerotic heart disease of native coronary artery without angina pectoris: Secondary | ICD-10-CM

## 2014-01-04 DIAGNOSIS — I200 Unstable angina: Secondary | ICD-10-CM | POA: Diagnosis present

## 2014-01-04 HISTORY — PX: LEFT HEART CATHETERIZATION WITH CORONARY ANGIOGRAM: SHX5451

## 2014-01-04 LAB — CBC
HCT: 30.6 % — ABNORMAL LOW (ref 36.0–46.0)
Hemoglobin: 10.1 g/dL — ABNORMAL LOW (ref 12.0–15.0)
MCH: 26.7 pg (ref 26.0–34.0)
MCHC: 33 g/dL (ref 30.0–36.0)
MCV: 81 fL (ref 78.0–100.0)
PLATELETS: 209 10*3/uL (ref 150–400)
RBC: 3.78 MIL/uL — ABNORMAL LOW (ref 3.87–5.11)
RDW: 16 % — AB (ref 11.5–15.5)
WBC: 6.3 10*3/uL (ref 4.0–10.5)

## 2014-01-04 LAB — SURGICAL PCR SCREEN
MRSA, PCR: NEGATIVE
Staphylococcus aureus: NEGATIVE

## 2014-01-04 LAB — PROTIME-INR
INR: 1.05 (ref 0.00–1.49)
Prothrombin Time: 13.5 seconds (ref 11.6–15.2)

## 2014-01-04 LAB — HEPARIN LEVEL (UNFRACTIONATED): Heparin Unfractionated: 0.31 IU/mL (ref 0.30–0.70)

## 2014-01-04 SURGERY — LEFT HEART CATHETERIZATION WITH CORONARY ANGIOGRAM
Anesthesia: LOCAL

## 2014-01-04 MED ORDER — HYDRALAZINE HCL 20 MG/ML IJ SOLN
INTRAMUSCULAR | Status: AC
  Filled 2014-01-04: qty 1

## 2014-01-04 MED ORDER — MIDAZOLAM HCL 2 MG/2ML IJ SOLN
INTRAMUSCULAR | Status: AC
  Filled 2014-01-04: qty 2

## 2014-01-04 MED ORDER — ONDANSETRON HCL 4 MG/2ML IJ SOLN
INTRAMUSCULAR | Status: AC
  Filled 2014-01-04: qty 2

## 2014-01-04 MED ORDER — VERAPAMIL HCL 2.5 MG/ML IV SOLN
INTRAVENOUS | Status: AC
  Filled 2014-01-04: qty 2

## 2014-01-04 MED ORDER — LIDOCAINE HCL (PF) 1 % IJ SOLN
INTRAMUSCULAR | Status: AC
  Filled 2014-01-04: qty 30

## 2014-01-04 MED ORDER — HEPARIN SODIUM (PORCINE) 1000 UNIT/ML IJ SOLN
INTRAMUSCULAR | Status: AC
  Filled 2014-01-04: qty 1

## 2014-01-04 MED ORDER — HEPARIN (PORCINE) IN NACL 100-0.45 UNIT/ML-% IJ SOLN
2350.0000 [IU]/h | INTRAMUSCULAR | Status: DC
  Administered 2014-01-05: 2150 [IU]/h via INTRAVENOUS
  Administered 2014-01-05 – 2014-01-06 (×2): 2350 [IU]/h via INTRAVENOUS
  Filled 2014-01-04 (×6): qty 250

## 2014-01-04 MED ORDER — NITROGLYCERIN 0.2 MG/ML ON CALL CATH LAB
INTRAVENOUS | Status: AC
  Filled 2014-01-04: qty 1

## 2014-01-04 MED ORDER — NITROGLYCERIN IN D5W 200-5 MCG/ML-% IV SOLN: 10.0000 ug/kg/min | INTRAVENOUS | Status: DC

## 2014-01-04 MED ORDER — SODIUM CHLORIDE 0.9 % IV SOLN: INTRAVENOUS | Status: AC

## 2014-01-04 MED ORDER — NITROGLYCERIN IN D5W 200-5 MCG/ML-% IV SOLN
INTRAVENOUS | Status: AC
  Filled 2014-01-04: qty 250

## 2014-01-04 MED ORDER — HEPARIN (PORCINE) IN NACL 2-0.9 UNIT/ML-% IJ SOLN
INTRAMUSCULAR | Status: AC
  Filled 2014-01-04: qty 1000

## 2014-01-04 MED ORDER — NITROGLYCERIN IN D5W 200-5 MCG/ML-% IV SOLN
10.0000 ug/min | INTRAVENOUS | Status: DC
  Administered 2014-01-04: 10 ug/min via INTRAVENOUS

## 2014-01-04 MED ORDER — FENTANYL CITRATE 0.05 MG/ML IJ SOLN
INTRAMUSCULAR | Status: AC
Start: 2014-01-04 — End: 2014-01-04
  Filled 2014-01-04: qty 2

## 2014-01-04 NOTE — Progress Notes (Signed)
Chaplain responded to spiritual care consult regarding advance directive. Patient was awake and alert. Chaplain provided her with advance directive materials and explained documents to patient. Patient will read over documents and request for chaplain through RN when she is ready to complete and notarize.She was grateful for support and materials.   Please page when requested.   [facility]: [phone]

## 2014-01-04 NOTE — Progress Notes (Signed)
Subjective:  No further chest pain Watched movie on cath and PCI Wants breakfast if cath will be in late PM  Objective:  Temp:  [98.4 F (36.9 C)-98.5 F (36.9 C)] 98.4 F (36.9 C) (02/13 0500) Pulse Rate:  [62-73] 67 (02/13 0500) Resp:  [18-20] 20 (02/13 0500) BP: (148-157)/(73-77) 157/76 mmHg (02/13 0500) SpO2:  [94 %-100 %] 94 % (02/13 0500) Weight change:   Intake/Output from previous day: 02/12 0701 - 02/13 0700 In: 360 [P.O.:360] Out: 1 [Urine:1]  Intake/Output from this shift:    Medications: Current Facility-Administered Medications  Medication Dose Route Frequency Provider Last Rate Last Dose  . 0.9 %  sodium chloride infusion   Intravenous Continuous Ejiroghene Emokpae, MD 75 mL/hr at 01/03/14 1529    . 0.9 %  sodium chloride infusion  250 mL Intravenous PRN Tarri Fuller, PA-C      . 0.9 %  sodium chloride infusion   Intravenous Continuous Tarri Fuller, PA-C 100 mL/hr at 01/04/14 0347    . acetaminophen (TYLENOL) tablet 650 mg  650 mg Oral Q4H PRN Jenetta Downer, MD   650 mg at 01/03/14 2104  . albuterol (PROVENTIL) (2.5 MG/3ML) 0.083% nebulizer solution 3 mL  3 mL Inhalation Q6H PRN Ejiroghene Emokpae, MD      . alum & mag hydroxide-simeth (MAALOX/MYLANTA) 200-200-20 MG/5ML suspension 15 mL  15 mL Oral Q6H PRN Jenetta Downer, MD   15 mL at 01/02/14 2122  . aspirin EC tablet 81 mg  81 mg Oral Daily Ejiroghene Emokpae, MD   81 mg at 01/03/14 1034  . atorvastatin (LIPITOR) tablet 80 mg  80 mg Oral q1800 Ejiroghene Emokpae, MD      . calcium-vitamin D (OSCAL WITH D) 500-200 MG-UNIT per tablet 1 tablet  1 tablet Oral Q breakfast Jenetta Downer, MD   1 tablet at 01/04/14 0731  . cycloSPORINE (RESTASIS) 0.05 % ophthalmic emulsion 1 drop  1 drop Both Eyes BID Ejiroghene Emokpae, MD   1 drop at 01/03/14 2300  . ferrous sulfate tablet 325 mg  325 mg Oral Q breakfast Jenetta Downer, MD   325 mg at 01/04/14 0731  . fluticasone (FLONASE) 50 MCG/ACT nasal spray  2 spray  2 spray Each Nare Daily Ejiroghene Emokpae, MD   2 spray at 01/03/14 1037  . heparin ADULT infusion 100 units/mL (25000 units/250 mL)  2,150 Units/hr Intravenous Continuous Truman Hayward, MD 21.5 mL/hr at 01/03/14 2005 2,150 Units/hr at 01/03/14 2005  . hydrochlorothiazide (MICROZIDE) capsule 12.5 mg  12.5 mg Oral Daily Truman Hayward, MD   12.5 mg at 01/03/14 1035  . irbesartan (AVAPRO) tablet 300 mg  300 mg Oral Daily Ejiroghene Emokpae, MD   300 mg at 01/03/14 1036  . metoprolol tartrate (LOPRESSOR) tablet 25 mg  25 mg Oral BID Ejiroghene Emokpae, MD   25 mg at 01/03/14 2300  . multivitamin with minerals tablet 1 tablet  1 tablet Oral Once per day on Mon Wed Fri Jenetta Downer, MD   1 tablet at 01/02/14 1233  . nitroGLYCERIN (NITROSTAT) SL tablet 0.4 mg  0.4 mg Sublingual Q5 Min x 3 PRN Jenetta Downer, MD   0.4 mg at 01/02/14 0142  . ondansetron (ZOFRAN) injection 4 mg  4 mg Intravenous Q6H PRN Jenetta Downer, MD   4 mg at 01/02/14 2122  . oxyCODONE (Oxy IR/ROXICODONE) immediate release tablet 5 mg  5 mg Oral Q6H PRN Joni Reining, DO   5 mg at 01/03/14 2300  .  pantoprazole (PROTONIX) EC tablet 80 mg  80 mg Oral Q1200 Ejiroghene Emokpae, MD   80 mg at 01/03/14 1259  . pneumococcal 23 valent vaccine (PNU-IMMUNE) injection 0.5 mL  0.5 mL Intramuscular Tomorrow-1000 Truman Hayward, MD      . senna-docusate (Senokot-S) tablet 1 tablet  1 tablet Oral Daily Jenetta Downer, MD   1 tablet at 01/03/14 1037  . sodium chloride 0.9 % injection 3 mL  3 mL Intravenous Q12H Tarri Fuller, PA-C   3 mL at 01/03/14 2259  . sodium chloride 0.9 % injection 3 mL  3 mL Intravenous PRN Tarri Fuller, PA-C      . vitamin C (ASCORBIC ACID) tablet 250 mg  250 mg Oral Q breakfast Ejiroghene Emokpae, MD   250 mg at 01/04/14 [id number]    Physical Exam: Constitutional: She is oriented to person, place, and time. She appears well-developed. No distress.  Morbidly obese  HENT:  Head:  Normocephalic and atraumatic.  Eyes: EOM are normal. Pupils are equal, round, and reactive to light. No scleral icterus.  Neck: Normal range of motion.  Cardiovascular: Normal rate, regular rhythm, S1 normal and S2 normal. No murmur heard.  Pulses: Radial pulses are 2+ on the right side, and 2+ on the left side. Dorsalis pedis pulses are 2+ on the right side, and 2+ on the left side.  Respiratory: Effort normal and breath sounds normal. She has no wheezes. She has no rales.  GI: Soft. Bowel sounds are normal. She exhibits no distension. There is no tenderness.  Musculoskeletal: She exhibits no edema.  Neurological: She is alert and oriented to person, place, and time. She exhibits normal muscle tone.  Skin: Skin is warm and dry.  Psychiatric: She has a normal mood and affect.    Lab Results: Results for orders placed during the hospital encounter of 01/01/14 (from the past 48 hour(s))  TROPONIN I     Status: None   Collection Time    01/02/14  9:10 AM      Result Value Ref Range   Troponin I <0.30  <0.30 ng/mL   Comment:            Due to the release kinetics of cTnI,     a negative result within the first hours     of the onset of symptoms does not rule out     myocardial infarction with certainty.     If myocardial infarction is still suspected,     repeat the test at appropriate intervals.  HEPARIN LEVEL (UNFRACTIONATED)     Status: Abnormal   Collection Time    01/02/14  9:10 AM      Result Value Ref Range   Heparin Unfractionated 0.19 (*) 0.30 - 0.70 IU/mL   Comment:            IF HEPARIN RESULTS ARE BELOW     EXPECTED VALUES, AND PATIENT     DOSAGE HAS BEEN CONFIRMED,     SUGGEST FOLLOW UP TESTING     OF ANTITHROMBIN III LEVELS.  CBC     Status: Abnormal   Collection Time    01/02/14  9:10 AM      Result Value Ref Range   WBC 6.7  4.0 - 10.5 K/uL   RBC 4.13  3.87 - 5.11 MIL/uL   Hemoglobin 11.3 (*) 12.0 - 15.0 g/dL   HCT 33.1 (*) 36.0 - 46.0 %   MCV 80.1  78.0 -  100.0 fL  MCH 27.4  26.0 - 34.0 pg   MCHC 34.1  30.0 - 36.0 g/dL   RDW 15.8 (*) 11.5 - 15.5 %   Platelets 242  150 - 400 K/uL  HIV ANTIBODY (ROUTINE TESTING)     Status: None   Collection Time    01/02/14  9:10 AM      Result Value Ref Range   HIV NON REACTIVE  NON REACTIVE   Comment: Performed at [facility]  TROPONIN I     Status: None   Collection Time    01/02/14 12:56 PM      Result Value Ref Range   Troponin I <0.30  <0.30 ng/mL   Comment:            Due to the release kinetics of cTnI,     a negative result within the first hours     of the onset of symptoms does not rule out     myocardial infarction with certainty.     If myocardial infarction is still suspected,     repeat the test at appropriate intervals.  TROPONIN I     Status: None   Collection Time    01/02/14  5:15 PM      Result Value Ref Range   Troponin I <0.30  <0.30 ng/mL   Comment:            Due to the release kinetics of cTnI,     a negative result within the first hours     of the onset of symptoms does not rule out     myocardial infarction with certainty.     If myocardial infarction is still suspected,     repeat the test at appropriate intervals.  HEPARIN LEVEL (UNFRACTIONATED)     Status: Abnormal   Collection Time    01/02/14  7:10 PM      Result Value Ref Range   Heparin Unfractionated 0.29 (*) 0.30 - 0.70 IU/mL   Comment:            IF HEPARIN RESULTS ARE BELOW     EXPECTED VALUES, AND PATIENT     DOSAGE HAS BEEN CONFIRMED,     SUGGEST FOLLOW UP TESTING     OF ANTITHROMBIN III LEVELS.  TROPONIN I     Status: None   Collection Time    01/02/14 10:45 PM      Result Value Ref Range   Troponin I <0.30  <0.30 ng/mL   Comment:            Due to the release kinetics of cTnI,     a negative result within the first hours     of the onset of symptoms does not rule out     myocardial infarction with certainty.     If myocardial infarction is still suspected,     repeat the test  at appropriate intervals.  HEPARIN LEVEL (UNFRACTIONATED)     Status: None   Collection Time    01/03/14  3:55 AM      Result Value Ref Range   Heparin Unfractionated 0.34  0.30 - 0.70 IU/mL   Comment:            IF HEPARIN RESULTS ARE BELOW     EXPECTED VALUES, AND PATIENT     DOSAGE HAS BEEN CONFIRMED,     SUGGEST FOLLOW UP TESTING     OF ANTITHROMBIN III LEVELS.  CBC     Status: Abnormal  Collection Time    01/03/14  3:55 AM      Result Value Ref Range   WBC 7.6  4.0 - 10.5 K/uL   RBC 3.96  3.87 - 5.11 MIL/uL   Hemoglobin 10.6 (*) 12.0 - 15.0 g/dL   HCT 32.1 (*) 36.0 - 46.0 %   MCV 81.1  78.0 - 100.0 fL   MCH 26.8  26.0 - 34.0 pg   MCHC 33.0  30.0 - 36.0 g/dL   RDW 16.0 (*) 11.5 - 15.5 %   Platelets 226  150 - 400 K/uL  HEPARIN LEVEL (UNFRACTIONATED)     Status: None   Collection Time    01/04/14  5:28 AM      Result Value Ref Range   Heparin Unfractionated 0.31  0.30 - 0.70 IU/mL   Comment:            IF HEPARIN RESULTS ARE BELOW     EXPECTED VALUES, AND PATIENT     DOSAGE HAS BEEN CONFIRMED,     SUGGEST FOLLOW UP TESTING     OF ANTITHROMBIN III LEVELS.  CBC     Status: Abnormal   Collection Time    01/04/14  5:28 AM      Result Value Ref Range   WBC 6.3  4.0 - 10.5 K/uL   RBC 3.78 (*) 3.87 - 5.11 MIL/uL   Hemoglobin 10.1 (*) 12.0 - 15.0 g/dL   HCT 30.6 (*) 36.0 - 46.0 %   MCV 81.0  78.0 - 100.0 fL   MCH 26.7  26.0 - 34.0 pg   MCHC 33.0  30.0 - 36.0 g/dL   RDW 16.0 (*) 11.5 - 15.5 %   Platelets 209  150 - 400 K/uL    Imaging: Nm Myocar Multi W/spect W/wall Motion / Ef  01/03/2014   CLINICAL DATA:  58 year old female with chest pain with  EXAM: MYOCARDIAL IMAGING WITH SPECT (REST AND PHARMACOLOGIC-STRESS - 2 DAY PROTOCOL)  GATED LEFT VENTRICULAR WALL MOTION STUDY  LEFT VENTRICULAR EJECTION FRACTION  TECHNIQUE: Standard myocardial SPECT imaging was performed after resting intravenous injection of 30 mCi Tc-56m sestamibi. Subsequently, on a second day,  intravenous infusion of Lexiscan was performed under the supervision of the Cardiology staff. At peak effect of the drug, 30 mCi Tc-25m sestamibi was injected intravenously and standard myocardial SPECT imaging was performed. Quantitative gated imaging was also performed to evaluate left ventricular wall motion, and estimate left ventricular ejection fraction.  COMPARISON:  None.  FINDINGS: Perfusion: There is some motion degradation on this 2 day study. 5+ breast size additionally.  There are decreased counts within the inferior lateral wall and lateral wall involving the mid and basilar segments which improved from stress to rest. This is concerning for ischemia of the lateral wall.  There is apparent fixed defect within the anterior septal wall apex with mild reversibility which may be due to motion.  Wall Motion: Motion artifact makes evaluation wall motion difficult.  Ejection Fraction: 58% %  End-diastolic volume equals: 099 mL Ml  End systolic volume equals: 53 mL Ml  IMPRESSION:  1. Concern for ischemia of the mid and basilar segments of the inferolateral and lateral wall. 2. Fixed defect with mild reversibility within the anterior septal wall versus motion artifact. 3. Wall motion is difficult to assess due to motion artifact. 4. Left ventricular ejection fraction equals 52% These results will be called to the ordering clinician or representative by the Radiologist Assistant, and communication documented in the  PACS Dashboard.   Electronically Signed   By: Suzy Bouchard M.D.   On: 01/03/2014 13:31    Assessment:  1. Principal Problem: 2.   Positive cardiac stress test 3. Active Problems: 4.   SLEEP APNEA, OBSTRUCTIVE, MILD 5.   HYPERTENSION 6.   Anginal chest pain at rest 7.   Right shoulder pain 8.   Plan:  1. Discussed LHC/possible PCI, planned via radial approach. She may have a slot earlier in the day so will not give brakfast.  Time Spent Directly with Patient:  20  minutes  Length of Stay:  LOS: 3 days    [name] 01/04/2014, 8:18 AM

## 2014-01-04 NOTE — Progress Notes (Signed)
ANTICOAGULATION CONSULT NOTE - Follow Up Consult  Pharmacy Consult for Heparin Indication: chest pain/ACS  Allergies  Allergen Reactions  . Cephalosporins Anaphylaxis    Tongue swelling, gum pain  . Codeine Other (See Comments)    hallucinations  . Latex Itching  . Penicillins     Pt reports no longer allergic  . Prednisone     Heart beating fast       Patient Measurements: Height: 5\' 8"  (172.7 cm) Weight: 319 lb 14.4 oz (145.106 kg) IBW/kg (Calculated) : 63.9 Heparin Dosing Weight: 100 kg  Vital Signs: Temp: 98.4 F (36.9 C) (02/13 0500) Temp src: Oral (02/13 0500) BP: 157/76 mmHg (02/13 0500) Pulse Rate: 67 (02/13 0500)  Labs:  Recent Labs  01/01/14 2020  01/02/14 0910 01/02/14 1256 01/02/14 1715 01/02/14 1910 01/02/14 2245 01/03/14 0355 01/04/14 0528  HGB 11.9*  --  11.3*  --   --   --   --  10.6* 10.1*  HCT 34.6*  --  33.1*  --   --   --   --  32.1* 30.6*  PLT 263  --  242  --   --   --   --  226 209  HEPARINUNFRC  --   < > 0.19*  --   --  0.29*  --  0.34 0.31  CREATININE 0.96  --   --   --   --   --   --   --   --   TROPONINI  --   < > <0.30 <0.30 <0.30  --  <0.30  --   --   < > = values in this interval not displayed.  Estimated Creatinine Clearance: 96 ml/min (by C-G formula based on Cr of 0.96).   Medications:  Scheduled:  . aspirin EC  81 mg Oral Daily  . atorvastatin  80 mg Oral q1800  . calcium-vitamin D  1 tablet Oral Q breakfast  . cycloSPORINE  1 drop Both Eyes BID  . ferrous sulfate  325 mg Oral Q breakfast  . fluticasone  2 spray Each Nare Daily  . hydrochlorothiazide  12.5 mg Oral Daily  . irbesartan  300 mg Oral Daily  . metoprolol tartrate  25 mg Oral BID  . multivitamin with minerals  1 tablet Oral Once per day on Mon Wed Fri  . pantoprazole  80 mg Oral Q1200  . pneumococcal 23 valent vaccine  0.5 mL Intramuscular Tomorrow-1000  . senna-docusate  1 tablet Oral Daily  . sodium chloride  3 mL Intravenous Q12H  . vitamin C  250  mg Oral Q breakfast   Infusions:  . sodium chloride 75 mL/hr at 01/03/14 1529  . sodium chloride 100 mL/hr at 01/04/14 0347  . heparin 2,150 Units/hr (01/03/14 2005)    Assessment: 58 yo F presented with CP and started on IV heparin. Heparin level=0.31, therapeutic, on 2150 units/hr.  Pt undergoing left heart cath today. No IV issues noted.  CBC is stable.  Goal of Therapy:  Heparin level 0.3-0.7 units/ml Monitor platelets by anticoagulation protocol: Yes   Plan:  Continue Heparin at 2150 units/hr Daily HL/CBC  Hughes Better PharmD, BCPS Clinical Pharmacist 01/04/2014 8:37 AM

## 2014-01-04 NOTE — H&P (View-Only) (Signed)
Subjective:  No further chest pain Watched movie on cath and PCI Wants breakfast if cath will be in late PM  Objective:  Temp:  [98.4 F (36.9 C)-98.5 F (36.9 C)] 98.4 F (36.9 C) (02/13 0500) Pulse Rate:  [62-73] 67 (02/13 0500) Resp:  [18-20] 20 (02/13 0500) BP: (148-157)/(73-77) 157/76 mmHg (02/13 0500) SpO2:  [94 %-100 %] 94 % (02/13 0500) Weight change:   Intake/Output from previous day: 02/12 0701 - 02/13 0700 In: 360 [P.O.:360] Out: 1 [Urine:1]  Intake/Output from this shift:    Medications: Current Facility-Administered Medications  Medication Dose Route Frequency Provider Last Rate Last Dose  . 0.9 %  sodium chloride infusion   Intravenous Continuous Ejiroghene Emokpae, MD 75 mL/hr at 01/03/14 1529    . 0.9 %  sodium chloride infusion  250 mL Intravenous PRN Tarri Fuller, PA-C      . 0.9 %  sodium chloride infusion   Intravenous Continuous Tarri Fuller, PA-C 100 mL/hr at 01/04/14 0347    . acetaminophen (TYLENOL) tablet 650 mg  650 mg Oral Q4H PRN Jenetta Downer, MD   650 mg at 01/03/14 2104  . albuterol (PROVENTIL) (2.5 MG/3ML) 0.083% nebulizer solution 3 mL  3 mL Inhalation Q6H PRN Ejiroghene Emokpae, MD      . alum & mag hydroxide-simeth (MAALOX/MYLANTA) 200-200-20 MG/5ML suspension 15 mL  15 mL Oral Q6H PRN Jenetta Downer, MD   15 mL at 01/02/14 2122  . aspirin EC tablet 81 mg  81 mg Oral Daily Ejiroghene Emokpae, MD   81 mg at 01/03/14 1034  . atorvastatin (LIPITOR) tablet 80 mg  80 mg Oral q1800 Ejiroghene Emokpae, MD      . calcium-vitamin D (OSCAL WITH D) 500-200 MG-UNIT per tablet 1 tablet  1 tablet Oral Q breakfast Jenetta Downer, MD   1 tablet at 01/04/14 0731  . cycloSPORINE (RESTASIS) 0.05 % ophthalmic emulsion 1 drop  1 drop Both Eyes BID Ejiroghene Emokpae, MD   1 drop at 01/03/14 2300  . ferrous sulfate tablet 325 mg  325 mg Oral Q breakfast Jenetta Downer, MD   325 mg at 01/04/14 0731  . fluticasone (FLONASE) 50 MCG/ACT nasal spray  2 spray  2 spray Each Nare Daily Ejiroghene Emokpae, MD   2 spray at 01/03/14 1037  . heparin ADULT infusion 100 units/mL (25000 units/250 mL)  2,150 Units/hr Intravenous Continuous Truman Hayward, MD 21.5 mL/hr at 01/03/14 2005 2,150 Units/hr at 01/03/14 2005  . hydrochlorothiazide (MICROZIDE) capsule 12.5 mg  12.5 mg Oral Daily Truman Hayward, MD   12.5 mg at 01/03/14 1035  . irbesartan (AVAPRO) tablet 300 mg  300 mg Oral Daily Ejiroghene Emokpae, MD   300 mg at 01/03/14 1036  . metoprolol tartrate (LOPRESSOR) tablet 25 mg  25 mg Oral BID Ejiroghene Emokpae, MD   25 mg at 01/03/14 2300  . multivitamin with minerals tablet 1 tablet  1 tablet Oral Once per day on Mon Wed Fri Jenetta Downer, MD   1 tablet at 01/02/14 1233  . nitroGLYCERIN (NITROSTAT) SL tablet 0.4 mg  0.4 mg Sublingual Q5 Min x 3 PRN Jenetta Downer, MD   0.4 mg at 01/02/14 0142  . ondansetron (ZOFRAN) injection 4 mg  4 mg Intravenous Q6H PRN Jenetta Downer, MD   4 mg at 01/02/14 2122  . oxyCODONE (Oxy IR/ROXICODONE) immediate release tablet 5 mg  5 mg Oral Q6H PRN Joni Reining, DO   5 mg at 01/03/14 2300  .  pantoprazole (PROTONIX) EC tablet 80 mg  80 mg Oral Q1200 Ejiroghene Emokpae, MD   80 mg at 01/03/14 1259  . pneumococcal 23 valent vaccine (PNU-IMMUNE) injection 0.5 mL  0.5 mL Intramuscular Tomorrow-1000 Cornelius N Van Dam, MD      . senna-docusate (Senokot-S) tablet 1 tablet  1 tablet Oral Daily Ejiroghene Emokpae, MD   1 tablet at 01/03/14 1037  . sodium chloride 0.9 % injection 3 mL  3 mL Intravenous Q12H Bryan Hager, PA-C   3 mL at 01/03/14 2259  . sodium chloride 0.9 % injection 3 mL  3 mL Intravenous PRN Bryan Hager, PA-C      . vitamin C (ASCORBIC ACID) tablet 250 mg  250 mg Oral Q breakfast Ejiroghene Emokpae, MD   250 mg at 01/04/14 0731    Physical Exam: Constitutional: She is oriented to person, place, and time. She appears well-developed. No distress.  Morbidly obese  HENT:  Head:  Normocephalic and atraumatic.  Eyes: EOM are normal. Pupils are equal, round, and reactive to light. No scleral icterus.  Neck: Normal range of motion.  Cardiovascular: Normal rate, regular rhythm, S1 normal and S2 normal. No murmur heard.  Pulses: Radial pulses are 2+ on the right side, and 2+ on the left side. Dorsalis pedis pulses are 2+ on the right side, and 2+ on the left side.  Respiratory: Effort normal and breath sounds normal. She has no wheezes. She has no rales.  GI: Soft. Bowel sounds are normal. She exhibits no distension. There is no tenderness.  Musculoskeletal: She exhibits no edema.  Neurological: She is alert and oriented to person, place, and time. She exhibits normal muscle tone.  Skin: Skin is warm and dry.  Psychiatric: She has a normal mood and affect.    Lab Results: Results for orders placed during the hospital encounter of 01/01/14 (from the past 48 hour(s))  TROPONIN I     Status: None   Collection Time    01/02/14  9:10 AM      Result Value Ref Range   Troponin I <0.30  <0.30 ng/mL   Comment:            Due to the release kinetics of cTnI,     a negative result within the first hours     of the onset of symptoms does not rule out     myocardial infarction with certainty.     If myocardial infarction is still suspected,     repeat the test at appropriate intervals.  HEPARIN LEVEL (UNFRACTIONATED)     Status: Abnormal   Collection Time    01/02/14  9:10 AM      Result Value Ref Range   Heparin Unfractionated 0.19 (*) 0.30 - 0.70 IU/mL   Comment:            IF HEPARIN RESULTS ARE BELOW     EXPECTED VALUES, AND PATIENT     DOSAGE HAS BEEN CONFIRMED,     SUGGEST FOLLOW UP TESTING     OF ANTITHROMBIN III LEVELS.  CBC     Status: Abnormal   Collection Time    01/02/14  9:10 AM      Result Value Ref Range   WBC 6.7  4.0 - 10.5 K/uL   RBC 4.13  3.87 - 5.11 MIL/uL   Hemoglobin 11.3 (*) 12.0 - 15.0 g/dL   HCT 33.1 (*) 36.0 - 46.0 %   MCV 80.1  78.0 -  100.0 fL     MCH 27.4  26.0 - 34.0 pg   MCHC 34.1  30.0 - 36.0 g/dL   RDW 15.8 (*) 11.5 - 15.5 %   Platelets 242  150 - 400 K/uL  HIV ANTIBODY (ROUTINE TESTING)     Status: None   Collection Time    01/02/14  9:10 AM      Result Value Ref Range   HIV NON REACTIVE  NON REACTIVE   Comment: Performed at [facility]  TROPONIN I     Status: None   Collection Time    01/02/14 12:56 PM      Result Value Ref Range   Troponin I <0.30  <0.30 ng/mL   Comment:            Due to the release kinetics of cTnI,     a negative result within the first hours     of the onset of symptoms does not rule out     myocardial infarction with certainty.     If myocardial infarction is still suspected,     repeat the test at appropriate intervals.  TROPONIN I     Status: None   Collection Time    01/02/14  5:15 PM      Result Value Ref Range   Troponin I <0.30  <0.30 ng/mL   Comment:            Due to the release kinetics of cTnI,     a negative result within the first hours     of the onset of symptoms does not rule out     myocardial infarction with certainty.     If myocardial infarction is still suspected,     repeat the test at appropriate intervals.  HEPARIN LEVEL (UNFRACTIONATED)     Status: Abnormal   Collection Time    01/02/14  7:10 PM      Result Value Ref Range   Heparin Unfractionated 0.29 (*) 0.30 - 0.70 IU/mL   Comment:            IF HEPARIN RESULTS ARE BELOW     EXPECTED VALUES, AND PATIENT     DOSAGE HAS BEEN CONFIRMED,     SUGGEST FOLLOW UP TESTING     OF ANTITHROMBIN III LEVELS.  TROPONIN I     Status: None   Collection Time    01/02/14 10:45 PM      Result Value Ref Range   Troponin I <0.30  <0.30 ng/mL   Comment:            Due to the release kinetics of cTnI,     a negative result within the first hours     of the onset of symptoms does not rule out     myocardial infarction with certainty.     If myocardial infarction is still suspected,     repeat the test  at appropriate intervals.  HEPARIN LEVEL (UNFRACTIONATED)     Status: None   Collection Time    01/03/14  3:55 AM      Result Value Ref Range   Heparin Unfractionated 0.34  0.30 - 0.70 IU/mL   Comment:            IF HEPARIN RESULTS ARE BELOW     EXPECTED VALUES, AND PATIENT     DOSAGE HAS BEEN CONFIRMED,     SUGGEST FOLLOW UP TESTING     OF ANTITHROMBIN III LEVELS.  CBC     Status: Abnormal  Collection Time    01/03/14  3:55 AM      Result Value Ref Range   WBC 7.6  4.0 - 10.5 K/uL   RBC 3.96  3.87 - 5.11 MIL/uL   Hemoglobin 10.6 (*) 12.0 - 15.0 g/dL   HCT 32.1 (*) 36.0 - 46.0 %   MCV 81.1  78.0 - 100.0 fL   MCH 26.8  26.0 - 34.0 pg   MCHC 33.0  30.0 - 36.0 g/dL   RDW 16.0 (*) 11.5 - 15.5 %   Platelets 226  150 - 400 K/uL  HEPARIN LEVEL (UNFRACTIONATED)     Status: None   Collection Time    01/04/14  5:28 AM      Result Value Ref Range   Heparin Unfractionated 0.31  0.30 - 0.70 IU/mL   Comment:            IF HEPARIN RESULTS ARE BELOW     EXPECTED VALUES, AND PATIENT     DOSAGE HAS BEEN CONFIRMED,     SUGGEST FOLLOW UP TESTING     OF ANTITHROMBIN III LEVELS.  CBC     Status: Abnormal   Collection Time    01/04/14  5:28 AM      Result Value Ref Range   WBC 6.3  4.0 - 10.5 K/uL   RBC 3.78 (*) 3.87 - 5.11 MIL/uL   Hemoglobin 10.1 (*) 12.0 - 15.0 g/dL   HCT 30.6 (*) 36.0 - 46.0 %   MCV 81.0  78.0 - 100.0 fL   MCH 26.7  26.0 - 34.0 pg   MCHC 33.0  30.0 - 36.0 g/dL   RDW 16.0 (*) 11.5 - 15.5 %   Platelets 209  150 - 400 K/uL    Imaging: Nm Myocar Multi W/spect W/wall Motion / Ef  01/03/2014   CLINICAL DATA:  58 year old female with chest pain with  EXAM: MYOCARDIAL IMAGING WITH SPECT (REST AND PHARMACOLOGIC-STRESS - 2 DAY PROTOCOL)  GATED LEFT VENTRICULAR WALL MOTION STUDY  LEFT VENTRICULAR EJECTION FRACTION  TECHNIQUE: Standard myocardial SPECT imaging was performed after resting intravenous injection of 30 mCi Tc-56m sestamibi. Subsequently, on a second day,  intravenous infusion of Lexiscan was performed under the supervision of the Cardiology staff. At peak effect of the drug, 30 mCi Tc-25m sestamibi was injected intravenously and standard myocardial SPECT imaging was performed. Quantitative gated imaging was also performed to evaluate left ventricular wall motion, and estimate left ventricular ejection fraction.  COMPARISON:  None.  FINDINGS: Perfusion: There is some motion degradation on this 2 day study. 5+ breast size additionally.  There are decreased counts within the inferior lateral wall and lateral wall involving the mid and basilar segments which improved from stress to rest. This is concerning for ischemia of the lateral wall.  There is apparent fixed defect within the anterior septal wall apex with mild reversibility which may be due to motion.  Wall Motion: Motion artifact makes evaluation wall motion difficult.  Ejection Fraction: 58% %  End-diastolic volume equals: 099 mL Ml  End systolic volume equals: 53 mL Ml  IMPRESSION:  1. Concern for ischemia of the mid and basilar segments of the inferolateral and lateral wall. 2. Fixed defect with mild reversibility within the anterior septal wall versus motion artifact. 3. Wall motion is difficult to assess due to motion artifact. 4. Left ventricular ejection fraction equals 52% These results will be called to the ordering clinician or representative by the Radiologist Assistant, and communication documented in the  PACS Dashboard.   Electronically Signed   By: Suzy Bouchard M.D.   On: 01/03/2014 13:31    Assessment:  1. Principal Problem: 2.   Positive cardiac stress test 3. Active Problems: 4.   SLEEP APNEA, OBSTRUCTIVE, MILD 5.   HYPERTENSION 6.   Anginal chest pain at rest 7.   Right shoulder pain 8.   Plan:  1. Discussed LHC/possible PCI, planned via radial approach. She may have a slot earlier in the day so will not give brakfast.  Time Spent Directly with Patient:  20  minutes  Length of Stay:  LOS: 3 days    Sherryann Frese 01/04/2014, 8:18 AM

## 2014-01-04 NOTE — Progress Notes (Addendum)
Internal Medicine Teaching Service Daily Progress Note  Subjective: Patient does report some symptoms of chest pressure intermittently.  Still concerned about right shoulder pain but pain is improved.  Reported mild nausea this AM. Reports she understands LHC procedure and possible stent placement. Objective: Vital signs in last 24 hours: Filed Vitals:   01/03/14 1329 01/03/14 2100 01/04/14 0500 01/04/14 1002  BP: 148/77 153/76 157/76 154/74  Pulse: 73 68 67 83  Temp: 98.4 F (36.9 C) 98.5 F (36.9 C) 98.4 F (36.9 C)   TempSrc: Oral Oral Oral   Resp: 20 18 20    Height:      Weight:      SpO2: 100% 94% 94%    Weight change:   Intake/Output Summary (Last 24 hours) at 01/04/14 1223 Last data filed at 01/03/14 1300  Gross per 24 hour  Intake    360 ml  Output      0 ml  Net    360 ml   General: resting in bed, NAD Cardiac: RRR, no rubs, murmurs or gallops Pulm: clear to auscultation bilaterally, moving normal volumes of air Abd: obese, soft, nontender, nondistended, BS present Ext: warm and well perfused, trace pedal edema Neuro: alert and oriented X4  Lab Results: Basic Metabolic Panel:  Recent Labs Lab 01/01/14 2020  NA 141  K 3.9  CL 101  CO2 26  GLUCOSE 129*  BUN 12  CREATININE 0.96  CALCIUM 9.3   CBC:  Recent Labs Lab 01/03/14 0355 01/04/14 0528  WBC 7.6 6.3  HGB 10.6* 10.1*  HCT 32.1* 30.6*  MCV 81.1 81.0  PLT 226 209   Cardiac Enzymes:  Recent Labs Lab 01/02/14 1256 01/02/14 1715 01/02/14 2245  TROPONINI <0.30 <0.30 <0.30   Micro Results: No results found for this or any previous visit (from the past 240 hour(s)). Studies/Results: Nm Myocar Multi W/spect W/wall Motion / Ef  01/03/2014   CLINICAL DATA:  58 year old female with chest pain with  EXAM: MYOCARDIAL IMAGING WITH SPECT (REST AND PHARMACOLOGIC-STRESS - 2 DAY PROTOCOL)  GATED LEFT VENTRICULAR WALL MOTION STUDY  LEFT VENTRICULAR EJECTION FRACTION  TECHNIQUE: Standard myocardial  SPECT imaging was performed after resting intravenous injection of 30 mCi Tc-71m sestamibi. Subsequently, on a second day, intravenous infusion of Lexiscan was performed under the supervision of the Cardiology staff. At peak effect of the drug, 30 mCi Tc-8m sestamibi was injected intravenously and standard myocardial SPECT imaging was performed. Quantitative gated imaging was also performed to evaluate left ventricular wall motion, and estimate left ventricular ejection fraction.  COMPARISON:  None.  FINDINGS: Perfusion: There is some motion degradation on this 2 day study. 5+ breast size additionally.  There are decreased counts within the inferior lateral wall and lateral wall involving the mid and basilar segments which improved from stress to rest. This is concerning for ischemia of the lateral wall.  There is apparent fixed defect within the anterior septal wall apex with mild reversibility which may be due to motion.  Wall Motion: Motion artifact makes evaluation wall motion difficult.  Ejection Fraction: 18% %  End-diastolic volume equals: 299 mL Ml  End systolic volume equals: 53 mL Ml  IMPRESSION:  1. Concern for ischemia of the mid and basilar segments of the inferolateral and lateral wall. 2. Fixed defect with mild reversibility within the anterior septal wall versus motion artifact. 3. Wall motion is difficult to assess due to motion artifact. 4. Left ventricular ejection fraction equals 52% These results will be called to  the ordering clinician or representative by the Radiologist Assistant, and communication documented in the PACS Dashboard.   Electronically Signed   By: Suzy Bouchard M.D.   On: 01/03/2014 13:31   Medications: I have reviewed the patient's current medications. Scheduled Meds: . aspirin EC  81 mg Oral Daily  . atorvastatin  80 mg Oral q1800  . calcium-vitamin D  1 tablet Oral Q breakfast  . cycloSPORINE  1 drop Both Eyes BID  . ferrous sulfate  325 mg Oral Q breakfast  .  fluticasone  2 spray Each Nare Daily  . hydrochlorothiazide  12.5 mg Oral Daily  . irbesartan  300 mg Oral Daily  . metoprolol tartrate  25 mg Oral BID  . multivitamin with minerals  1 tablet Oral Once per day on Mon Wed Fri  . pantoprazole  80 mg Oral Q1200  . pneumococcal 23 valent vaccine  0.5 mL Intramuscular Tomorrow-1000  . senna-docusate  1 tablet Oral Daily  . sodium chloride  3 mL Intravenous Q12H  . vitamin C  250 mg Oral Q breakfast   Continuous Infusions: . sodium chloride 75 mL/hr at 01/03/14 1529  . sodium chloride 100 mL/hr at 01/04/14 0347  . heparin 2,150 Units/hr (01/04/14 1004)   PRN Meds:.sodium chloride, acetaminophen, albuterol, alum & mag hydroxide-simeth, nitroGLYCERIN, ondansetron (ZOFRAN) IV, oxyCODONE, sodium chloride Assessment/Plan:  Positive Cardiac Stress Test - Continue ASA - Metoprolol - Heparin -NTG SL PRN - Patient to have LHC this PM, NPO in prep.    HYPERTENSION - HCTZ 12.5 daily - Irbesartan 300mg  daily - Metoprolol 25 bid  Right shoulder pain Likely secondary to OA vs rotator cuff.  Not urgent, pain control with Oxycodone (would need second IV for morphine and patient does not want second IV) - Will need ortho follow up as outpatient. [facility]- Dr. Alvan Dame did knees) -Oxycodone 5mg  q6prn  HLD - Continue Atorvastatin  Asthma No acute issues Albuterol PRN  DVT PPx: On full dose heparin Diet: NPO in prep for LHC Dispo: Disposition is deferred at this time, awaiting improvement of current medical problems.   The patient does have a current PCP (Madilyn Fireman, MD) and does need an [facility] hospital follow-up appointment after discharge.  The patient does not have transportation limitations that hinder transportation to clinic appointments.  .Services Needed at time of discharge: Y = Yes, Blank = No PT:   OT:   RN:   Equipment:   Other:     LOS: 3 days   Joni Reining, DO 01/04/2014, 12:23 PM   Date:  01/04/2014  Patient name: [name]  Medical record number: [id number]  Date of birth: [birth year]   This patient has been seen and the plan of care was discussed with the house staff. Please see their note for complete details. I concur with their findings with the following additions/corrections:  Patient On her side I examined her today she was awaiting cardiac catheterization which is now underway.  Greatly Appreciate cardiology's critical assistance with this patient  Truman Hayward, MD 01/04/2014, 4:20 PM

## 2014-01-04 NOTE — Progress Notes (Signed)
ANTICOAGULATION CONSULT NOTE - Follow Up Consult  Pharmacy Consult for Heparin Indication: chest pain/ACS  Allergies  Allergen Reactions  . Cephalosporins Anaphylaxis    Tongue swelling, gum pain  . Codeine Other (See Comments)    hallucinations  . Latex Itching  . Penicillins     Pt reports no longer allergic  . Prednisone     Heart beating fast       Patient Measurements: Height: 5\' 8"  (172.7 cm) Weight: 332 lb 10.8 oz (150.9 kg) IBW/kg (Calculated) : 63.9 Heparin Dosing Weight: 100 kg  Vital Signs: Temp: 98.7 F (37.1 C) (02/13 1900) Temp src: Oral (02/13 1900) BP: 172/89 mmHg (02/13 1323) Pulse Rate: 82 (02/13 1530)  Labs:  Recent Labs  01/02/14 0910 01/02/14 1256 01/02/14 1715 01/02/14 1910 01/02/14 2245 01/03/14 0355 01/04/14 0426 01/04/14 0528  HGB 11.3*  --   --   --   --  10.6*  --  10.1*  HCT 33.1*  --   --   --   --  32.1*  --  30.6*  PLT 242  --   --   --   --  226  --  209  LABPROT  --   --   --   --   --   --  13.5  --   INR  --   --   --   --   --   --  1.05  --   HEPARINUNFRC 0.19*  --   --  0.29*  --  0.34  --  0.31  TROPONINI <0.30 <0.30 <0.30  --  <0.30  --   --   --     Estimated Creatinine Clearance: 98.3 ml/min (by C-G formula based on Cr of 0.96).   Medications:  Scheduled:  . aspirin EC  81 mg Oral Daily  . atorvastatin  80 mg Oral q1800  . calcium-vitamin D  1 tablet Oral Q breakfast  . cycloSPORINE  1 drop Both Eyes BID  . ferrous sulfate  325 mg Oral Q breakfast  . fluticasone  2 spray Each Nare Daily  . hydrochlorothiazide  12.5 mg Oral Daily  . irbesartan  300 mg Oral Daily  . metoprolol tartrate  25 mg Oral BID  . multivitamin with minerals  1 tablet Oral Once per day on Mon Wed Fri  . pantoprazole  80 mg Oral Q1200  . pneumococcal 23 valent vaccine  0.5 mL Intramuscular Tomorrow-1000  . senna-docusate  1 tablet Oral Daily  . vitamin C  250 mg Oral Q breakfast   Infusions:  . sodium chloride 750 mL (01/04/14  1756)  . heparin Stopped (01/04/14 1504)  . nitroGLYCERIN 10 mcg/min (01/04/14 1900)    Assessment: S/p cardiac cath in this 58 yo F,. Restart heparin 8 hours post sheath removal. This AM the heparin level of 0.31 was  therapeutic on 2150 units/hr.  Post cath no bleeding or hematoma noted.  Goal of Therapy:  Heparin level 0.3-0.7 units/ml Monitor platelets by anticoagulation protocol: Yes   Plan:  Restart IV Heparin at 2150 units/hr,  8hours post sheath removal.  Check 6 hour heparin level/CBC.  Daily HL/CBC  Nicole Cella, RPh Clinical Pharmacist Pager: [phone] 01/04/2014 8:25 PM

## 2014-01-04 NOTE — Interval H&P Note (Signed)
History and Physical Interval Note:  01/04/2014 4:18 PM The patient was interviewed. The clinical data was reviewed. The nuclear data was also reviewed. The procedure and risks including stroke, death, myocardial infarction, bleeding, limb ischemia, allergy, kidney failure, among others were discussed in detail and except above the patientCath Lab Visit (complete for each Cath Lab visit)  Clinical Evaluation Leading to the Procedure:   ACS: no  Non-ACS:    Anginal Classification: CCS III  Anti-ischemic medical therapy: Minimal Therapy (1 class of medications)  Non-Invasive Test Results: High-risk stress test findings: cardiac mortality >3%/year  Prior CABG: No previous CABG       [name]  has presented today for surgery, with the diagnosis of positive nuclear stress test  The various methods of treatment have been discussed with the patient and family. After consideration of risks, benefits and other options for treatment, the patient has consented to  Procedure(s): LEFT HEART CATHETERIZATION WITH CORONARY ANGIOGRAM (N/A) as a surgical intervention .  The patient's history has been reviewed, patient examined, no change in status, stable for surgery.  I have reviewed the patient's chart and labs.  Questions were answered to the patient's satisfaction.     Sinclair Grooms

## 2014-01-05 ENCOUNTER — Inpatient Hospital Stay (HOSPITAL_COMMUNITY): Payer: Medicare Other

## 2014-01-05 DIAGNOSIS — I200 Unstable angina: Secondary | ICD-10-CM

## 2014-01-05 DIAGNOSIS — I2510 Atherosclerotic heart disease of native coronary artery without angina pectoris: Principal | ICD-10-CM | POA: Diagnosis present

## 2014-01-05 DIAGNOSIS — M79609 Pain in unspecified limb: Secondary | ICD-10-CM

## 2014-01-05 DIAGNOSIS — Z01810 Encounter for preprocedural cardiovascular examination: Secondary | ICD-10-CM

## 2014-01-05 DIAGNOSIS — I517 Cardiomegaly: Secondary | ICD-10-CM

## 2014-01-05 LAB — GLUCOSE, CAPILLARY: GLUCOSE-CAPILLARY: 115 mg/dL — AB (ref 70–99)

## 2014-01-05 LAB — CBC
HCT: 33 % — ABNORMAL LOW (ref 36.0–46.0)
HEMOGLOBIN: 11.2 g/dL — AB (ref 12.0–15.0)
MCH: 27 pg (ref 26.0–34.0)
MCHC: 33.9 g/dL (ref 30.0–36.0)
MCV: 79.5 fL (ref 78.0–100.0)
Platelets: 239 10*3/uL (ref 150–400)
RBC: 4.15 MIL/uL (ref 3.87–5.11)
RDW: 16.1 % — ABNORMAL HIGH (ref 11.5–15.5)
WBC: 7.1 10*3/uL (ref 4.0–10.5)

## 2014-01-05 LAB — BLOOD GAS, ARTERIAL
Acid-Base Excess: 3.1 mmol/L — ABNORMAL HIGH (ref 0.0–2.0)
Bicarbonate: 27.3 mEq/L — ABNORMAL HIGH (ref 20.0–24.0)
Drawn by: 24513
FIO2: 21 %
O2 Saturation: 93.9 %
Patient temperature: 98.1
TCO2: 28.6 mmol/L (ref 0–100)
pCO2 arterial: 41.8 mmHg (ref 35.0–45.0)
pH, Arterial: 7.428 (ref 7.350–7.450)
pO2, Arterial: 66.3 mmHg — ABNORMAL LOW (ref 80.0–100.0)

## 2014-01-05 LAB — HEPARIN LEVEL (UNFRACTIONATED)
Heparin Unfractionated: 0.23 IU/mL — ABNORMAL LOW (ref 0.30–0.70)
Heparin Unfractionated: 0.27 IU/mL — ABNORMAL LOW (ref 0.30–0.70)

## 2014-01-05 MED ORDER — INSULIN ASPART 100 UNIT/ML ~~LOC~~ SOLN: 0.0000 [IU] | Freq: Three times a day (TID) | SUBCUTANEOUS | Status: DC

## 2014-01-05 MED ORDER — INSULIN ASPART 100 UNIT/ML ~~LOC~~ SOLN: 0.0000 [IU] | Freq: Every day | SUBCUTANEOUS | Status: DC

## 2014-01-05 MED ORDER — ALPRAZOLAM 0.25 MG PO TABS: 0.2500 mg | ORAL_TABLET | ORAL | Status: DC | PRN

## 2014-01-05 MED ORDER — CHLORHEXIDINE GLUCONATE CLOTH 2 % EX PADS: 6.0000 | MEDICATED_PAD | Freq: Once | CUTANEOUS | Status: DC

## 2014-01-05 MED ORDER — LACTULOSE 10 GM/15ML PO SOLN
20.0000 g | Freq: Every day | ORAL | Status: DC | PRN
  Filled 2014-01-05: qty 30

## 2014-01-05 MED ORDER — TEMAZEPAM 15 MG PO CAPS: 15.0000 mg | ORAL_CAPSULE | Freq: Once | ORAL | Status: AC | PRN

## 2014-01-05 MED ORDER — METOPROLOL TARTRATE 12.5 MG HALF TABLET
12.5000 mg | ORAL_TABLET | Freq: Once | ORAL | Status: DC
  Filled 2014-01-05: qty 1

## 2014-01-05 MED ORDER — BISACODYL 5 MG PO TBEC
5.0000 mg | DELAYED_RELEASE_TABLET | Freq: Once | ORAL | Status: DC
Start: 2014-01-05 — End: 2014-01-09

## 2014-01-05 NOTE — Progress Notes (Signed)
ANTICOAGULATION CONSULT NOTE - Follow Up Consult  Pharmacy Consult for Heparin Indication: chest pain/ACS  Allergies  Allergen Reactions  . Cephalosporins Anaphylaxis    Tongue swelling, gum pain  . Codeine Other (See Comments)    hallucinations  . Latex Itching  . Penicillins     Pt reports no longer allergic  . Prednisone     Heart beating fast       Patient Measurements: Height: 5\' 8"  (172.7 cm) Weight: 332 lb 10.8 oz (150.9 kg) IBW/kg (Calculated) : 63.9 Heparin Dosing Weight: 100 kg  Vital Signs: Temp: 99.1 F (37.3 C) (02/14 1955) Temp src: Oral (02/14 1955) BP: 171/95 mmHg (02/14 1955) Pulse Rate: 78 (02/14 1955)  Labs:  Recent Labs  01/02/14 2245  01/03/14 0355 01/04/14 0426 01/04/14 0528 01/05/14 1010 01/05/14 1928  HGB  --   < > 10.6*  --  10.1* 11.2*  --   HCT  --   --  32.1*  --  30.6* 33.0*  --   PLT  --   --  226  --  209 239  --   LABPROT  --   --   --  13.5  --   --   --   INR  --   --   --  1.05  --   --   --   HEPARINUNFRC  --   < > 0.34  --  0.31 0.23* 0.27*  TROPONINI <0.30  --   --   --   --   --   --   < > = values in this interval not displayed.  Estimated Creatinine Clearance: 98.3 ml/min (by C-G formula based on Cr of 0.96).   Medications:  Scheduled:  . aspirin EC  81 mg Oral Daily  . atorvastatin  80 mg Oral q1800  . bisacodyl  5 mg Oral Once  . calcium-vitamin D  1 tablet Oral Q breakfast  . Chlorhexidine Gluconate Cloth  6 each Topical Once   And  . Chlorhexidine Gluconate Cloth  6 each Topical Once  . cycloSPORINE  1 drop Both Eyes BID  . ferrous sulfate  325 mg Oral Q breakfast  . fluticasone  2 spray Each Nare Daily  . hydrochlorothiazide  12.5 mg Oral Daily  . insulin aspart  0-20 Units Subcutaneous TID WC  . insulin aspart  0-5 Units Subcutaneous QHS  . irbesartan  300 mg Oral Daily  . [START ON 01/06/2014] metoprolol tartrate  12.5 mg Oral Once  . metoprolol tartrate  25 mg Oral BID  . multivitamin with  minerals  1 tablet Oral Once per day on Mon Wed Fri  . pantoprazole  80 mg Oral Q1200  . pneumococcal 23 valent vaccine  0.5 mL Intramuscular Tomorrow-1000  . senna-docusate  1 tablet Oral Daily  . vitamin C  250 mg Oral Q breakfast   Infusions:  . heparin 2,350 Units/hr (01/05/14 1727)    Assessment: 57 yo female s/p cath on heparin and noted for CABG on 01/08/13. Heparin level is 0.27 after increase to 2350 units/hr but remains below goal.  Goal of Therapy:  Heparin level 0.3-0.7 units/ml Monitor platelets by anticoagulation protocol: Yes   Plan:   -Increase heparin to 2550 units/hr -Heparin level in am  Hildred Laser, Pharm D 01/05/2014 8:11 PM

## 2014-01-05 NOTE — Progress Notes (Signed)
ANTICOAGULATION CONSULT NOTE - Follow Up Consult  Pharmacy Consult for Heparin Indication: chest pain/ACS  Allergies  Allergen Reactions  . Cephalosporins Anaphylaxis    Tongue swelling, gum pain  . Codeine Other (See Comments)    hallucinations  . Latex Itching  . Penicillins     Pt reports no longer allergic  . Prednisone     Heart beating fast       Patient Measurements: Height: 5\' 8"  (172.7 cm) Weight: 332 lb 10.8 oz (150.9 kg) IBW/kg (Calculated) : 63.9 Heparin Dosing Weight: 100 kg  Vital Signs: Temp: 98.6 F (37 C) (02/14 0800) Temp src: Oral (02/14 0800) BP: 138/74 mmHg (02/14 0800) Pulse Rate: 71 (02/14 0800)  Labs:  Recent Labs  01/02/14 1715  01/02/14 2245  01/03/14 0355 01/04/14 0426 01/04/14 0528 01/05/14 1010  HGB  --   --   --   < > 10.6*  --  10.1* 11.2*  HCT  --   --   --   --  32.1*  --  30.6* 33.0*  PLT  --   --   --   --  226  --  209 239  LABPROT  --   --   --   --   --  13.5  --   --   INR  --   --   --   --   --  1.05  --   --   HEPARINUNFRC  --   < >  --   --  0.34  --  0.31 0.23*  TROPONINI <0.30  --  <0.30  --   --   --   --   --   < > = values in this interval not displayed.  Estimated Creatinine Clearance: 98.3 ml/min (by C-G formula based on Cr of 0.96).   Medications:  Scheduled:  . aspirin EC  81 mg Oral Daily  . atorvastatin  80 mg Oral q1800  . calcium-vitamin D  1 tablet Oral Q breakfast  . cycloSPORINE  1 drop Both Eyes BID  . ferrous sulfate  325 mg Oral Q breakfast  . fluticasone  2 spray Each Nare Daily  . hydrochlorothiazide  12.5 mg Oral Daily  . insulin aspart  0-20 Units Subcutaneous TID WC  . insulin aspart  0-5 Units Subcutaneous QHS  . irbesartan  300 mg Oral Daily  . metoprolol tartrate  25 mg Oral BID  . multivitamin with minerals  1 tablet Oral Once per day on Mon Wed Fri  . pantoprazole  80 mg Oral Q1200  . pneumococcal 23 valent vaccine  0.5 mL Intramuscular Tomorrow-1000  . senna-docusate  1  tablet Oral Daily  . vitamin C  250 mg Oral Q breakfast   Infusions:  . heparin 2,150 Units/hr (01/05/14 0800)    Assessment: S/p cardiac cath on 2/13 in this 58 yo F. Restart heparin 8 hours post sheath removal. Post cath no bleeding or hematoma noted. HL now SUBtherapeutic at 0.23. Of note, patient removed lines and significant bleeding was noted but now resolved. H/H low but improved, plt wnl and stable. No other noted s/s bleeding.   Goal of Therapy:  Heparin level 0.3-0.7 units/ml Monitor platelets by anticoagulation protocol: Yes   Plan:  - Increase Heparin to 2350 units/hr (2 units/kg/hr) - Check 6 hour heparin level/CBC - Daily HL/CBC  Harolyn Rutherford, PharmD Clinical Pharmacist - Resident Pager: [phone] Pharmacy: [phone] 01/05/2014 1:09 PM

## 2014-01-05 NOTE — Progress Notes (Signed)
Internal Medicine Teaching Service Daily Progress Note  Subjective: Patient underwent LHC yesterday, reports she feels well this AM, currently no chest pain/pressure.  Understands she will likely need CABG.  Otherwise doing well and in good spirits. Objective: Vital signs in last 24 hours: Filed Vitals:   01/05/14 0000 01/05/14 0100 01/05/14 0333 01/05/14 0400  BP: 129/65 146/67  172/88  Pulse: 70 67 81 80  Temp:    97.9 F (36.6 C)  TempSrc:    Oral  Resp:      Height:      Weight:      SpO2: 97% 96% 97% 96%   Weight change:   Intake/Output Summary (Last 24 hours) at 01/05/14 1119 Last data filed at 01/05/14 0600  Gross per 24 hour  Intake    508 ml  Output      0 ml  Net    508 ml   General: resting in bed, NAD Cardiac: RRR, no rubs, murmurs or gallops Pulm: clear to auscultation bilaterally, moving normal volumes of air Abd: obese, soft, nontender, nondistended, BS present Ext: right wrist no ecchymosis pulses intact, warm and well perfused, trace pedal edema Neuro: alert and oriented X4  Lab Results: Basic Metabolic Panel:  Recent Labs Lab 01/01/14 2020  NA 141  K 3.9  CL 101  CO2 26  GLUCOSE 129*  BUN 12  CREATININE 0.96  CALCIUM 9.3   CBC:  Recent Labs Lab 01/04/14 0528 01/05/14 1010  WBC 6.3 7.1  HGB 10.1* 11.2*  HCT 30.6* 33.0*  MCV 81.0 79.5  PLT 209 239   Cardiac Enzymes:  Recent Labs Lab 01/02/14 1256 01/02/14 1715 01/02/14 2245  TROPONINI <0.30 <0.30 <0.30   Micro Results: Recent Results (from the past 240 hour(s))  SURGICAL PCR SCREEN     Status: None   Collection Time    01/04/14  6:39 PM      Result Value Ref Range Status   MRSA, PCR NEGATIVE  NEGATIVE Final   Staphylococcus aureus NEGATIVE  NEGATIVE Final   Comment:            The Xpert SA Assay (FDA     approved for NASAL specimens     in patients over 68 years of age),     is one component of     a comprehensive surveillance     program.  Test performance has   been validated by [employer] for patients greater     than or equal to 9 year old.     It is not intended     to diagnose infection nor to     guide or monitor treatment.   Studies/Results: Dg Chest 2 View  01/05/2014   CLINICAL DATA:  Coronary artery disease. Preoperative respiratory exam.  EXAM: CHEST - 2 VIEW  COMPARISON:  DG CHEST 2 VIEW dated 01/01/2014; DG CHEST 2 VIEW dated 06/11/2012  FINDINGS: The heart size and mediastinal contours are within normal limits. There is no evidence of pulmonary edema, consolidation, pneumothorax, nodule or pleural fluid. Stable degenerative changes are present in the thoracic spine.  IMPRESSION: No active disease.   Electronically Signed   By: Aletta Edouard M.D.   On: 01/05/2014 08:11   Medications: I have reviewed the patient's current medications. Scheduled Meds: . aspirin EC  81 mg Oral Daily  . atorvastatin  80 mg Oral q1800  . calcium-vitamin D  1 tablet Oral Q breakfast  . cycloSPORINE  1 drop Both Eyes BID  . ferrous sulfate  325 mg Oral Q breakfast  . fluticasone  2 spray Each Nare Daily  . hydrochlorothiazide  12.5 mg Oral Daily  . insulin aspart  0-20 Units Subcutaneous TID WC  . insulin aspart  0-5 Units Subcutaneous QHS  . irbesartan  300 mg Oral Daily  . metoprolol tartrate  25 mg Oral BID  . multivitamin with minerals  1 tablet Oral Once per day on Mon Wed Fri  . pantoprazole  80 mg Oral Q1200  . pneumococcal 23 valent vaccine  0.5 mL Intramuscular Tomorrow-1000  . senna-docusate  1 tablet Oral Daily  . vitamin C  250 mg Oral Q breakfast   Continuous Infusions: . heparin 2,150 Units/hr (01/05/14 0334)  . nitroGLYCERIN Stopped (01/04/14 2130)   PRN Meds:.acetaminophen, albuterol, alum & mag hydroxide-simeth, nitroGLYCERIN, ondansetron (ZOFRAN) IV, oxyCODONE Assessment/Plan:  Severe 3 vessel CARD involving the LAD, CFx and RCA - Cardiology following -Continue ASA - Metoprolol - Heparin, Can stop heparin if remains CP  free -NTG SL PRN -CVTS consult  (Dr. Prescott Gum) to consider CABG, if weight problem RCA and LAD may be stented -Transfer to telemetry.    HYPERTENSION - HCTZ 12.5 daily - Irbesartan 300mg  daily - Metoprolol 25 bid  Right shoulder pain Likely secondary to OA vs rotator cuff.  Not urgent, pain control with Oxycodone (would need second IV for morphine and patient does not want second IV) - Will need ortho follow up as outpatient. [facility]- Dr. Alvan Dame did knees) -Oxycodone 5mg  q6prn  HLD - Continue Atorvastatin  Asthma No acute issues Albuterol PRN  Normocytic anemia - No active bleeding workup as outpatient.  DVT PPx: On full dose heparin Diet: Carb mod Dispo: Disposition is deferred at this time, awaiting improvement of current medical problems.   The patient does have a current PCP (Madilyn Fireman, MD) and does need an [facility] hospital follow-up appointment after discharge.  The patient does not have transportation limitations that hinder transportation to clinic appointments.  .Services Needed at time of discharge: Y = Yes, Blank = No PT:   OT:   RN:   Equipment:   Other:     LOS: 4 days   Joni Reining, DO 01/05/2014, 11:19 AM

## 2014-01-05 NOTE — Progress Notes (Signed)
Subjective: [name] is a 58 yo woman pmh CAD, HLD, DM, GERD, OSA, menorrhagia, and HTN p/w CP. Underwent Myoview on 01/03/13 that showed ischemia of the mid and basilar segments of hte inferolateral and lateral walls.   LHC on 01/04/14 showed severe 3V disease with normal EF. Referred for CABG.   No CP or dyspnea overnight.   Objective: Vital signs in last 24 hours: Filed Vitals:   01/05/14 0000 01/05/14 0100 01/05/14 0333 01/05/14 0400  BP: 129/65 146/67  172/88  Pulse: 70 67 81 80  Temp:    97.9 F (36.6 C)  TempSrc:    Oral  Resp:      Height:      Weight:      SpO2: 97% 96% 97% 96%   Weight change:   Intake/Output Summary (Last 24 hours) at 01/05/14 0926 Last data filed at 01/05/14 0600  Gross per 24 hour  Intake    508 ml  Output      0 ml  Net    508 ml   General: resting in bed, NAD HEENT: PERRL, EOMI, no scleral icterus Cardiac: RRR, no rubs, murmurs or gallops Pulm: clear to auscultation bilaterally, moving normal volumes of air Abd: soft, nontender, nondistended, BS present Ext: warm and well perfused, no pedal edema Neuro: alert and oriented X3, cranial nerves II-XII grossly intact  Lab Results: Basic Metabolic Panel:  Recent Labs Lab 01/01/14 2020  NA 141  K 3.9  CL 101  CO2 26  GLUCOSE 129*  BUN 12  CREATININE 0.96  CALCIUM 9.3   CBC:  Recent Labs Lab 01/03/14 0355 01/04/14 0528  WBC 7.6 6.3  HGB 10.6* 10.1*  HCT 32.1* 30.6*  MCV 81.1 81.0  PLT 226 209   Cardiac Enzymes:  Recent Labs Lab 01/02/14 1256 01/02/14 1715 01/02/14 2245  TROPONINI <0.30 <0.30 <0.30   Hemoglobin A1C:  Recent Labs Lab 01/02/14 0457  HGBA1C 5.1   Coagulation:  Recent Labs Lab 01/04/14 0426  LABPROT 13.5  INR 1.05   Cardiac Studies LHC 01/04/14: cath films viewed independently and shows multivessel disease no intervention done   Studies/Results: Dg Chest 2 View  01/05/2014   CLINICAL DATA:  Coronary artery disease. Preoperative respiratory  exam.  EXAM: CHEST - 2 VIEW  COMPARISON:  DG CHEST 2 VIEW dated 01/01/2014; DG CHEST 2 VIEW dated 06/11/2012  FINDINGS: The heart size and mediastinal contours are within normal limits. There is no evidence of pulmonary edema, consolidation, pneumothorax, nodule or pleural fluid. Stable degenerative changes are present in the thoracic spine.  IMPRESSION: No active disease.   Electronically Signed   By: Aletta Edouard M.D.   On: 01/05/2014 08:11   Nm Myocar Multi W/spect W/wall Motion / Ef  01/03/2014   CLINICAL DATA:  58 year old female with chest pain with  EXAM: MYOCARDIAL IMAGING WITH SPECT (REST AND PHARMACOLOGIC-STRESS - 2 DAY PROTOCOL)  GATED LEFT VENTRICULAR WALL MOTION STUDY  LEFT VENTRICULAR EJECTION FRACTION  TECHNIQUE: Standard myocardial SPECT imaging was performed after resting intravenous injection of 30 mCi Tc-27m sestamibi. Subsequently, on a second day, intravenous infusion of Lexiscan was performed under the supervision of the Cardiology staff. At peak effect of the drug, 30 mCi Tc-60m sestamibi was injected intravenously and standard myocardial SPECT imaging was performed. Quantitative gated imaging was also performed to evaluate left ventricular wall motion, and estimate left ventricular ejection fraction.  COMPARISON:  None.  FINDINGS: Perfusion: There is some motion degradation on this 2 day study.  5+ breast size additionally.  There are decreased counts within the inferior lateral wall and lateral wall involving the mid and basilar segments which improved from stress to rest. This is concerning for ischemia of the lateral wall.  There is apparent fixed defect within the anterior septal wall apex with mild reversibility which may be due to motion.  Wall Motion: Motion artifact makes evaluation wall motion difficult.  Ejection Fraction: 98% %  End-diastolic volume equals: 921 mL Ml  End systolic volume equals: 53 mL Ml  IMPRESSION:  1. Concern for ischemia of the mid and basilar segments of  the inferolateral and lateral wall. 2. Fixed defect with mild reversibility within the anterior septal wall versus motion artifact. 3. Wall motion is difficult to assess due to motion artifact. 4. Left ventricular ejection fraction equals 52% These results will be called to the ordering clinician or representative by the Radiologist Assistant, and communication documented in the PACS Dashboard.   Electronically Signed   By: Suzy Bouchard M.D.   On: 01/03/2014 13:31   Medications: I have reviewed the patient's current medications. Scheduled Meds: . aspirin EC  81 mg Oral Daily  . atorvastatin  80 mg Oral q1800  . calcium-vitamin D  1 tablet Oral Q breakfast  . cycloSPORINE  1 drop Both Eyes BID  . ferrous sulfate  325 mg Oral Q breakfast  . fluticasone  2 spray Each Nare Daily  . hydrochlorothiazide  12.5 mg Oral Daily  . irbesartan  300 mg Oral Daily  . metoprolol tartrate  25 mg Oral BID  . multivitamin with minerals  1 tablet Oral Once per day on Mon Wed Fri  . pantoprazole  80 mg Oral Q1200  . pneumococcal 23 valent vaccine  0.5 mL Intramuscular Tomorrow-1000  . senna-docusate  1 tablet Oral Daily  . vitamin C  250 mg Oral Q breakfast   Continuous Infusions: . heparin 2,150 Units/hr (01/05/14 0334)  . nitroGLYCERIN Stopped (01/04/14 2130)   PRN Meds:.acetaminophen, albuterol, alum & mag hydroxide-simeth, nitroGLYCERIN, ondansetron (ZOFRAN) IV, oxyCODONE Assessment/Plan: 1. [place] - CAD s/p LHC on 01/04/14: multivessel disease. Will need CABG. CVTS to discuss with patient. Pt can transfer to SDU on 01/05/14.Marland Kitchen Cont ASA, BB, Statin, ARB. Repeat echo pending. Will continue heparin for now. If CP free for 24 hours can d/c.   2. HTN: pt poorly controlled outpt, now improved  3. OSA  4. DM2 Other medical problems mgmnt per primary team   LOS: 4 days     Clinton Gallant, MD 01/05/2014, 9:26 AM  Patient seen and examined with Dr. Algis Liming. We discussed all aspects of the encounter. I agree with  the assessment and plan as stated above.   She is doing well post cath. Can transfer to SDU. Dr. Prescott Gum to see for CABG. Can stop heparin if remains CP free. BP improved. I reviewed echo at bedside EF looks good. Appears to have calcified LV band.   Daniel Bensimhon,MD 9:58 AM

## 2014-01-05 NOTE — Progress Notes (Addendum)
Pre-op Cardiac Surgery  Carotid Findings:  1-39% ICA stenosis.  Vertebral artery flow is antegrade.  Upper Extremity Right Left  Brachial Pressures 157 Triphasic  149 Triphasic   Radial Waveforms Triphasic  Triphasic   Ulnar Waveforms Triphasic  Triphasic   Palmar Arch (Allen's Test) Within normal limits  Within normal limits     Lower  Extremity Right Left  Dorsalis Pedis Bilateral palpable pulses.    Anterior Tibial    Posterior Tibial    Ankle/Brachial Indices         Ralene Cork, RVT 01/07/2014 3:09 PM

## 2014-01-05 NOTE — Progress Notes (Signed)
VASCULAR LAB PRELIMINARY  PRELIMINARY  PRELIMINARY  PRELIMINARY  Bilateral lower extremity venous Dopplers completed.    Preliminary report:  There is no obvious evidence of DVT noted in the visualized veins of the bilateral lower extremities.  Manessa Buley, RVT 01/05/2014, 12:52 PM

## 2014-01-05 NOTE — Progress Notes (Signed)
CARDIAC REHAB PHASE I   PRE:  Rate/Rhythm: SR 74  BP:  Supine:   Sitting: 157/76  Standing:    SaO2: 96 RA  MODE:  Ambulation: 250 ft   POST:  Rate/Rhythm: SR 99  BP:  Supine:   Sitting: 160/73  Standing:    SaO2: 99 RA Pt assisted to ambulate in hallway x 1 assist.  Pt typically uses a cane but felt steady enough for short walk to ambulate without any assistive device.  Pt moved steady but did fatigue and ask to cut ambulation short and return to her room.  Pt assisted to chair.  In anticipation of heart surgery, reviewed with pt expectations. Flow chart - Your Hospital stay: Cardiac Surgery given.  Reviewed with pt - pt education video for preparing for heart surgery.  Pt asked to view this later today.  Pt given preparing for heart surgery booklet.  Talked with pt about the importance of ambulation, chair exercises while sitting, getting up without using arms which was difficult for pt due to prior left knew replacement and hip discomfort.  Reviewed with pt deep breathing exercises.  Pt verbalized and demonstrated these appropriately. Pt sitting in chair with church visitors at her bedside, call bell in place.  Cherre Huger, BSN [phone]  Donney Dice

## 2014-01-05 NOTE — Progress Notes (Signed)
Right Lower Extremity Vein Map    Right Great Saphenous Vein   Segment Diameter Comment  1. Origin mm Unable to visualize  2. High Thigh 2.36mm   3. Mid Thigh 4.2mm   4. Low Thigh 4.53mm   5. At Knee 2.53mm   6. High Calf 2.16mm   7. Low Calf 2.48mm Branch  8. Ankle 24mm    mm    mm    mm      Left Lower Extremity Vein Map    Left Great Saphenous Vein   Segment Diameter Comment  1. Origin mm Unable to visualize  2. High Thigh 6.13mm   3. Mid Thigh 4.66mm   4. Low Thigh 5.1mm   5. At Knee 3.68mm   6. High Calf 2.82mm Branch  7. Low Calf 2.93mm Branch  8. Ankle 76mm Branch   mm    mm    mm

## 2014-01-05 NOTE — Consult Note (Signed)
[address]       [place], [address]             [phone]        Cornelius A Sellman [place] Medical Record #[id number] Date of Birth: 05-06-1956  Referring: No ref. provider found Primary Care: Madilyn Fireman, MD  Chief Complaint:    Chief Complaint  Patient presents with  . Chest Pain   patient examined, cardiac catheterization in 2-D echocardiogram reviewed  History of Present Illness:     58 year old African American obese female presented with symptoms of unstable angina and positive cardiac enzymes. Her chest pain was resolved with IV heparin and IV nitroglycerin. Yesterday she underwent cardiac catheterization via right radial artery which demonstrates severe three-vessel CAD with graftable target vessels. Echocardiogram today shows good LV function with some left ventricular hypertrophy from her hypertension and there does not appear to be significant valve disease. Because of her severe three-vessel coronary disease a surgical evaluation was requested for possible surgical coronary revascularization.  Patient is prior history of heart disease, arrhythmia, murmur, rheumatic fever, or symptoms of CHF.  Patient has had previous left knee surgery twice as well as hemorrhoid surgery without anesthesia or surgical complication. She currently uses a cane to help walk but was able to walk 200 feet with cardiac rehabilitation today.  The patient is morbidly obese at one point weight 400 pounds, current was used to 30 pounds. BMI is 50.1  Patient has history of a TIA in 3 years ago without CVA. Carotid duplex scans are ordered.   Current Activity/ Functional Status: Patient lives by herself in a retirement community with family living close by. She is disabled.   Zubrod Score: At the time of surgery this patient's most appropriate activity status/level should be described as: []     0    Normal activity, no symptoms []     1    Restricted in physical  strenuous activity but ambulatory, able to do out light work []     2    Ambulatory and capable of self care, unable to do work activities, up and about                 more than 50%  Of the time                            [x]     3    Only limited self care, in bed greater than 50% of waking hours []     4    Completely disabled, no self care, confined to bed or chair []     5    Moribund  Past Medical History  Diagnosis Date  . Seasonal allergies   . Asthma     History of  . Hypertension   . Iron deficiency 12-07-2011  . Anemia   . GERD (gastroesophageal reflux disease)   . Heart murmur   . Osteoporosis   . PONV (postoperative nausea and vomiting)   . Dysrhythmia     occ palpitations   . Sleep apnea     no CPAP machine , borderine   . Headache(784.0)     occasional   . Osteoarthritis     osteoarthritis   . Morbid obesity   . Acute urinary retention s/p Foley 01/07/2012  . Hemorrhoids, internal, with bleeding & prolapse 12/13/2011  . Myocardial infarction     unsure when; per  cardiologist report  . Chronic headaches     Past Surgical History  Procedure Laterality Date  . Knee surgery      Both knees  . Total knee arthroplasty  04/29/2011    Left knee  . Multiple tooth extractions    . Wisdom tooth extraction    . Colonoscopy  2009  . Knee arthroscopy  1991  . Transanal hemorrhoidal dearterliaization  01/06/12    with external hemorrhoid removal    History  Smoking status  . Never Smoker   Smokeless tobacco  . Never Used    History  Alcohol Use No    History   Social History  . Marital Status: Single    Spouse Name: N/A    Number of Children: 2  . Years of Education: N/A   Occupational History  . Retired    Social History Main Topics  . Smoking status: Never Smoker   . Smokeless tobacco: Never Used  . Alcohol Use: No  . Drug Use: No  . Sexual Activity: Not Currently   Other Topics Concern  . Not on file   Social History Narrative  . No narrative  on file    Allergies  Allergen Reactions  . Cephalosporins Anaphylaxis    Tongue swelling, gum pain  . Codeine Other (See Comments)    hallucinations  . Latex Itching  . Penicillins     Pt reports no longer allergic  . Prednisone     Heart beating fast       Current Facility-Administered Medications  Medication Dose Route Frequency Provider Last Rate Last Dose  . acetaminophen (TYLENOL) tablet 650 mg  650 mg Oral Q4H PRN Ejiroghene Emokpae, MD   650 mg at 01/05/14 1021  . albuterol (PROVENTIL) (2.5 MG/3ML) 0.083% nebulizer solution 3 mL  3 mL Inhalation Q6H PRN Ejiroghene Emokpae, MD      . alum & mag hydroxide-simeth (MAALOX/MYLANTA) 200-200-20 MG/5ML suspension 15 mL  15 mL Oral Q6H PRN Jenetta Downer, MD   15 mL at 01/02/14 2122  . aspirin EC tablet 81 mg  81 mg Oral Daily Ejiroghene Emokpae, MD   81 mg at 01/05/14 1021  . atorvastatin (LIPITOR) tablet 80 mg  80 mg Oral q1800 Ejiroghene Emokpae, MD      . calcium-vitamin D (OSCAL WITH D) 500-200 MG-UNIT per tablet 1 tablet  1 tablet Oral Q breakfast Jenetta Downer, MD   1 tablet at 01/05/14 0837  . cycloSPORINE (RESTASIS) 0.05 % ophthalmic emulsion 1 drop  1 drop Both Eyes BID Ejiroghene Emokpae, MD   8 drop at 01/05/14 1022  . ferrous sulfate tablet 325 mg  325 mg Oral Q breakfast Jenetta Downer, MD   325 mg at 01/05/14 0837  . fluticasone (FLONASE) 50 MCG/ACT nasal spray 2 spray  2 spray Each Nare Daily Ejiroghene Emokpae, MD   2 spray at 01/05/14 1023  . heparin ADULT infusion 100 units/mL (25000 units/250 mL)  2,150 Units/hr Intravenous Continuous Arman Bogus, RPH 21.5 mL/hr at 01/05/14 0334 2,150 Units/hr at 01/05/14 0334  . hydrochlorothiazide (MICROZIDE) capsule 12.5 mg  12.5 mg Oral Daily Truman Hayward, MD   12.5 mg at 01/05/14 1021  . insulin aspart (novoLOG) injection 0-20 Units  0-20 Units Subcutaneous TID WC Ivin Poot, MD      . insulin aspart (novoLOG) injection 0-5 Units  0-5 Units  Subcutaneous QHS Ivin Poot, MD      . irbesartan (AVAPRO) tablet 300 mg  300 mg Oral Daily Ejiroghene Emokpae, MD   300 mg at 01/05/14 1022  . metoprolol tartrate (LOPRESSOR) tablet 25 mg  25 mg Oral BID Ejiroghene Emokpae, MD   25 mg at 01/05/14 1022  . multivitamin with minerals tablet 1 tablet  1 tablet Oral Once per day on Mon Wed Fri Jenetta Downer, MD   1 tablet at 01/02/14 1233  . nitroGLYCERIN (NITROSTAT) SL tablet 0.4 mg  0.4 mg Sublingual Q5 Min x 3 PRN Jenetta Downer, MD   0.4 mg at 01/02/14 0142  . ondansetron (ZOFRAN) injection 4 mg  4 mg Intravenous Q6H PRN Jenetta Downer, MD   4 mg at 01/02/14 2122  . oxyCODONE (Oxy IR/ROXICODONE) immediate release tablet 5 mg  5 mg Oral Q6H PRN Joni Reining, DO   5 mg at 01/04/14 2009  . pantoprazole (PROTONIX) EC tablet 80 mg  80 mg Oral Q1200 Ejiroghene Emokpae, MD   80 mg at 01/05/14 1021  . pneumococcal 23 valent vaccine (PNU-IMMUNE) injection 0.5 mL  0.5 mL Intramuscular Tomorrow-1000 Truman Hayward, MD      . senna-docusate (Senokot-S) tablet 1 tablet  1 tablet Oral Daily Jenetta Downer, MD   1 tablet at 01/05/14 1022  . vitamin C (ASCORBIC ACID) tablet 250 mg  250 mg Oral Q breakfast Ejiroghene Emokpae, MD   250 mg at 01/05/14 0240    Prescriptions prior to admission  Medication Sig Dispense Refill  . albuterol (PROVENTIL HFA;VENTOLIN HFA) 108 (90 BASE) MCG/ACT inhaler Inhale 2 puffs into the lungs every 6 (six) hours as needed. Wheezing  3.7 g  5  . alum & mag hydroxide-simeth (MYLANTA DOUBLE-STRENGTH) 400-400-40 MG/5ML suspension Take 10 mLs by mouth every 6 (six) hours as needed (for cramping or bloating).  355 mL  0  . Calcium Citrate-Vitamin D (CITRACAL/VITAMIN D) 250-200 MG-UNIT TABS Take 2 tablets by mouth daily.  90 each  4  . cycloSPORINE (RESTASIS) 0.05 % ophthalmic emulsion Place 1 drop into both eyes 2 (two) times daily.      . diclofenac sodium (VOLTAREN) 1 % GEL Apply 2 g topically 2 (two) times daily  as needed. TOPICAL TO KNEE, SHOULDER, WRIST AND ANKLE FOR PAIN  1 Tube  0  . diphenhydrAMINE (BENADRYL) 25 mg capsule Take 1 capsule (25 mg total) by mouth every 4 (four) hours as needed for itching.  24 capsule  2  . fluticasone (VERAMYST) 27.5 MCG/SPRAY nasal spray Place 2 sprays into the nose daily.      . hydrochlorothiazide (HYDRODIURIL) 12.5 MG tablet Take 1 tablet (12.5 mg total) by mouth daily.  30 tablet  6  . hydrOXYzine (ATARAX/VISTARIL) 10 MG tablet Take 1 tablet (10 mg total) by mouth 3 (three) times daily as needed for itching.  60 tablet  0  . Iron-Vitamin C 100-250 MG TABS Take 1 tablet by mouth daily.  60 each  2  . Multiple Vitamin (MULITIVITAMIN WITH MINERALS) TABS Take 1 tablet by mouth 3 (three) times a week.       Marland Kitchen NEXIUM 40 MG capsule TAKE ONE CAPSULE BY MOUTH EVERY DAY  30 capsule  1  . Polyethyl Glycol-Propyl Glycol (SYSTANE FREE OP) Place 1 drop into both eyes daily.      Marland Kitchen senna-docusate (SENOKOT-S) 8.6-50 MG per tablet Take 1 tablet by mouth daily.  30 tablet  5  . triamcinolone cream (KENALOG) 0.1 % Apply on affected skin 2 times a day as needed  85.2 g  1  .  valsartan (DIOVAN) 320 MG tablet Take 1 tablet (320 mg total) by mouth daily.  120 tablet  3    Family History  Problem Relation Age of Onset  . Colon cancer Neg Hx   . Diabetes Mother   . Hypertension Mother   . Heart disease Sister     Congestive heart failure  . Diabetes type II Sister   . Kidney disease Brother   . Heart attack Mother 61     Review of Systems:   The patient is allergic to penicillin and cephalosporin No history of thoracic trauma or pneumothorax No history of bleeding diathesis No history DVT or varicosities of her leg veins Patient denies history of diabetes No family history of CAD or CABG Positive family history of hypertension    Cardiac Review of Systems: Y or N  Chest Pain [ Y.    ]  Resting SOB [ in   ] Exertional SOB  [Y.  ]  Orthopnea [ and  ]   Pedal Edema [ Y.    ]    Palpitations [ Y.  ] Syncope  Evolet.Dace  ]   Presyncope [ and   ]  General Review of Systems: [Y] = yes [  ]=no Constitional: recent weight change [ in  ]; anorexia [  ]; fatigue [  ]; nausea [  ]; night sweats [  ]; fever [  ]; or chills [  ]                                                               Dental: poor dentition[  ]; Last Dentist visit:   Eye : blurred vision [  ]; diplopia [   ]; vision changes [  ];  Amaurosis fugax[  ]; Resp: cough [  ];  wheezing[  ];  hemoptysis[  ]; shortness of breath[  ]; paroxysmal nocturnal dyspnea[  ]; dyspnea on exertion[  Y.  ]; or orthopnea[  ];  GI:  gallstones[  ], vomiting[  ];  dysphagia[  ]; melena[  ];  hematochezia [  ]; heartburn[  Y.  ];   Hx of  Colonoscopy[  ];history of hemorrhoids GU: kidney stones [  ]; hematuria[  ];   dysuria [  ];  nocturia[  ];  history of     obstruction [  ]; urinary frequency [  ]             Skin: rash, swelling[  ];, hair loss[  ];  peripheral edema[  ];  or itching[  ]; Musculosketetal: myalgias[  ];  joint swelling[  ];  joint erythema[  ];  joint pain[  ];  back pain[  ];  Heme/Lymph: bruising[  ];  bleeding[  ];  anemia[  ];  Neuro: TIA[  ];  headaches[  ];  stroke[  ];  vertigo[  ];  seizures[  ];   paresthesias[  ];  difficulty walking[  ];  Psych:depression[  ]; anxiety[  ];  Endocrine: diabetes[ and is   ];  thyroid dysfunction[  ];  Immunizations: Flu [  ]; Pneumococcal[  ];  Other:  Physical Exam: BP 172/88  Pulse 80  Temp(Src) 97.9 F (36.6 C) (Oral)  Resp 18  Ht 5\' 8"  (1.727  m)  Wt 332 lb 10.8 oz (150.9 kg)  BMI 50.59 kg/m2  SpO2 96%  LMP 10/26/2013  Exam  general -alert and comfortable sitting up in a chair no complaints of angina   HEENT-normocephalic pupils equal dentition good Neck-no JVD mass or carotid bruit or adenopathy palpable Thorax-no deformity or tenderness breath sounds clear bilaterally Abdomen-obese soft nontender without pulsatile mass Extremities-no clubbing cyanosis  edema or tenderness Vascular-no evidence of varicosities, palpable pulses in the extremities   neuro -alert and appropriate, no focal motor weakness     Diagnostic Studies & Laboratory data:     Recent Radiology Findings:   Dg Chest 2 View  01/05/2014   CLINICAL DATA:  Coronary artery disease. Preoperative respiratory exam.  EXAM: CHEST - 2 VIEW  COMPARISON:  DG CHEST 2 VIEW dated 01/01/2014; DG CHEST 2 VIEW dated 06/11/2012  FINDINGS: The heart size and mediastinal contours are within normal limits. There is no evidence of pulmonary edema, consolidation, pneumothorax, nodule or pleural fluid. Stable degenerative changes are present in the thoracic spine.  IMPRESSION: No active disease.   Electronically Signed   By: Aletta Edouard M.D.   On: 01/05/2014 08:11      Recent Lab Findings: Lab Results  Component Value Date   WBC 7.1 01/05/2014   HGB 11.2* 01/05/2014   HCT 33.0* 01/05/2014   PLT 239 01/05/2014   GLUCOSE 129* 01/01/2014   CHOL 195 10/15/2013   TRIG 110 10/15/2013   HDL 50 10/15/2013   LDLCALC 123* 10/15/2013   ALT 15 11/28/2012   AST 19 11/28/2012   NA 141 01/01/2014   K 3.9 01/01/2014   CL 101 01/01/2014   CREATININE 0.96 01/01/2014   BUN 12 01/01/2014   CO2 26 01/01/2014   TSH 1.503 09/25/2010   INR 1.05 01/04/2014   HGBA1C 5.1 01/02/2014      Assessment / Plan:      Very nice morbidly obese 58 year old female with severe three-vessel coronary disease, unstable angina with subendocardial MI and good LV function without significant valvular disease. She appears to be an acceptable candidate for multivessel CABG but at increased risk due to her obesity, sleep apnea, and deconditioning.  I discussed the procedure CABG in detail the patient and she understands the this would represent her best long-term therapy for her significant coronary artery disease. We'll plan surgical coronary pressurization-CABG Tuesday, February 17 a.m.      @ME1 @ 01/05/2014 11:52 AM

## 2014-01-05 NOTE — Progress Notes (Signed)
Report to Vincente Liberty, Therapist, sports; pt transferred to floor with wheelchair and telemetry; questions encouraged and answered

## 2014-01-05 NOTE — CV Procedure (Signed)
     Left Heart Catheterization with Coronary Angiography  Report  [name]  58 y.o.  female [birth year]  Procedure Date: 01/04/2014  Referring Physician: C. Tommy Medal, MD Primary Cardiologist: Gwenlyn Found, MD  INDICATIONS: Class 3 angina and abnormal myocardial perfusion study  PROCEDURE: 1. Left heart cath; 2. Coronary angiography; 3. Left ventriculography  CONSENT:  The risks, benefits, and details of the procedure were explained in detail to the patient. Risks including death, stroke, heart attack, kidney injury, allergy, limb ischemia, bleeding and radiation injury were discussed.  The patient verbalized understanding and wanted to proceed.  Informed written consent was obtained.  PROCEDURE TECHNIQUE:  After Xylocaine anesthesia a 44F Slender sheath was placed in the right radial artery with a single double wall needle stick.  Coronary angiography was done using a 44F JR4 and JL3.5 catheters.  Left ventriculography was done using the JR4 catheter and hand injection.   Hemostasis achieved with the wrist band without complications  MEDICATIONS: Versed 1 mg IV and Fentanyl 50 micrograms IV  CONTRAST:  Total of 90 cc.  COMPLICATIONS:  none   HEMODYNAMICS:  Aortic pressure 186/93 mmHg; LV pressure 190/21 mmHg; LVEDP 27 mmHg  ANGIOGRAPHIC DATA:   The left main coronary artery is widely patent.  The left anterior descending artery is is moderately calcified proximally. There is severe proximal to mid 90% stenosis. The LAD is transapical.  The left circumflex artery is highly diseased. There is 80-90% stenosis of two large marginal branches and high grade mid circumflex stenosis.  The right coronary artery is dominant and contains 95% stenosis distally before the origin of the PDA.   LEFT VENTRICULOGRAM:  Left ventricular angiogram was done in the 30 RAO projection and revealed normal contractility with EF 60%.   IMPRESSIONS:  1. Severe 3 vessel CAD involving the LAD, Cfx, and  RCA  2. Preserved LV systolic function.   RECOMMENDATION:   Consider CABG. Her weight may be a problem with bypass. If so, RCA and LAD could be stented. The circumflex would be more difficult to treat percutaneously.Marland Kitchen

## 2014-01-05 NOTE — Progress Notes (Signed)
Echocardiogram 2D Echocardiogram has been performed.  [name] 01/05/2014, 10:52 AM

## 2014-01-06 LAB — GLUCOSE, CAPILLARY
GLUCOSE-CAPILLARY: 133 mg/dL — AB (ref 70–99)
GLUCOSE-CAPILLARY: 83 mg/dL (ref 70–99)
GLUCOSE-CAPILLARY: 114 mg/dL — AB (ref 70–99)
Glucose-Capillary: 122 mg/dL — ABNORMAL HIGH (ref 70–99)

## 2014-01-06 LAB — CBC
HCT: 32.8 % — ABNORMAL LOW (ref 36.0–46.0)
Hemoglobin: 11.1 g/dL — ABNORMAL LOW (ref 12.0–15.0)
MCH: 27.1 pg (ref 26.0–34.0)
MCHC: 33.8 g/dL (ref 30.0–36.0)
MCV: 80 fL (ref 78.0–100.0)
PLATELETS: 252 10*3/uL (ref 150–400)
RBC: 4.1 MIL/uL (ref 3.87–5.11)
RDW: 16.2 % — ABNORMAL HIGH (ref 11.5–15.5)
WBC: 7.3 10*3/uL (ref 4.0–10.5)

## 2014-01-06 LAB — BASIC METABOLIC PANEL
BUN: 10 mg/dL (ref 6–23)
CO2: 27 mEq/L (ref 19–32)
Calcium: 9.5 mg/dL (ref 8.4–10.5)
Chloride: 100 mEq/L (ref 96–112)
Creatinine, Ser: 0.78 mg/dL (ref 0.50–1.10)
GFR calc Af Amer: >90 mL/min (ref >90)
GFR calc non Af Amer: 90 mL/min — ABNORMAL LOW (ref >90)
Glucose, Bld: 111 mg/dL — ABNORMAL HIGH (ref 70–99)
Potassium: 3.5 mEq/L — ABNORMAL LOW (ref 3.7–5.3)
Sodium: 141 mEq/L (ref 137–147)

## 2014-01-06 LAB — PREPARE RBC (CROSSMATCH)

## 2014-01-06 LAB — HEPARIN LEVEL (UNFRACTIONATED): HEPARIN UNFRACTIONATED: 0.47 [IU]/mL (ref 0.30–0.70)

## 2014-01-06 MED ORDER — POLYETHYLENE GLYCOL 3350 17 G PO PACK
17.0000 g | PACK | Freq: Every day | ORAL | Status: DC | PRN
  Administered 2014-01-07: 17 g via ORAL
  Filled 2014-01-06 (×3): qty 1

## 2014-01-06 MED ORDER — VALACYCLOVIR HCL 500 MG PO TABS
1000.0000 mg | ORAL_TABLET | Freq: Three times a day (TID) | ORAL | Status: DC
  Administered 2014-01-06 – 2014-01-07 (×3): 1000 mg via ORAL
  Filled 2014-01-06 (×5): qty 2

## 2014-01-06 MED ORDER — HEPARIN SODIUM (PORCINE) 5000 UNIT/ML IJ SOLN
5000.0000 [IU] | Freq: Three times a day (TID) | INTRAMUSCULAR | Status: DC
  Administered 2014-01-06 – 2014-01-08 (×7): 5000 [IU] via SUBCUTANEOUS
  Filled 2014-01-06 (×12): qty 1

## 2014-01-06 MED ORDER — POTASSIUM CHLORIDE CRYS ER 20 MEQ PO TBCR
40.0000 meq | EXTENDED_RELEASE_TABLET | Freq: Once | ORAL | Status: AC
  Administered 2014-01-06: 40 meq via ORAL

## 2014-01-06 MED ORDER — HYDROCHLOROTHIAZIDE 25 MG PO TABS
25.0000 mg | ORAL_TABLET | Freq: Every day | ORAL | Status: DC
  Administered 2014-01-07 – 2014-01-08 (×2): 25 mg via ORAL
  Filled 2014-01-06 (×4): qty 1

## 2014-01-06 NOTE — Progress Notes (Addendum)
Internal Medicine Teaching Service Daily Progress Note  Subjective: No bowel movement x3 days.  Patient reports she is a little nervious about upcoming CABG but appreciates all of the doctors and nursing support and explanation.  Continues to have some right posterior shoulder pain she has acutally notice a few "pimples" pop up back there.  No further episodes of chest pain for SOB. Objective: Vital signs in last 24 hours: Filed Vitals:   01/05/14 1553 01/05/14 1635 01/05/14 1955 01/06/14 0417  BP: 153/70 166/97 171/95 160/80  Pulse: 72 72 78 65  Temp: 98.3 F (36.8 C) 97.5 F (36.4 C) 99.1 F (37.3 C) 98.7 F (37.1 C)  TempSrc: Oral Oral Oral Oral  Resp: 16 20 21 20   Height:      Weight:      SpO2: 98% 97% 97% 98%   Weight change:   Intake/Output Summary (Last 24 hours) at 01/06/14 0914 Last data filed at 01/05/14 1400  Gross per 24 hour  Intake    379 ml  Output      0 ml  Net    379 ml   General: resting in bed, NAD Cardiac: RRR, no rubs, murmurs or gallops Pulm: clear to auscultation bilaterally, moving normal volumes of air Abd: obese, soft, nontender, nondistended, BS present Ext: warm and well perfused, trace pedal edema Back: 4 small pustules on the right at about the level of T6.   Lab Results: Basic Metabolic Panel:  Recent Labs Lab 01/01/14 2020 01/06/14 0550  NA 141 141  K 3.9 3.5*  CL 101 100  CO2 26 27  GLUCOSE 129* 111*  BUN 12 10  CREATININE 0.96 0.78  CALCIUM 9.3 9.5   CBC:  Recent Labs Lab 01/05/14 1010 01/06/14 0550  WBC 7.1 7.3  HGB 11.2* 11.1*  HCT 33.0* 32.8*  MCV 79.5 80.0  PLT 239 252   Cardiac Enzymes:  Recent Labs Lab 01/02/14 1256 01/02/14 1715 01/02/14 2245  TROPONINI <0.30 <0.30 <0.30   Micro Results: Recent Results (from the past 240 hour(s))  SURGICAL PCR SCREEN     Status: None   Collection Time    01/04/14  6:39 PM      Result Value Ref Range Status   MRSA, PCR NEGATIVE  NEGATIVE Final   Staphylococcus  aureus NEGATIVE  NEGATIVE Final   Comment:            The Xpert SA Assay (FDA     approved for NASAL specimens     in patients over 15 years of age),     is one component of     a comprehensive surveillance     program.  Test performance has     been validated by [employer] for patients greater     than or equal to 69 year old.     It is not intended     to diagnose infection nor to     guide or monitor treatment.   Studies/Results: Dg Chest 2 View  01/05/2014   CLINICAL DATA:  Coronary artery disease. Preoperative respiratory exam.  EXAM: CHEST - 2 VIEW  COMPARISON:  DG CHEST 2 VIEW dated 01/01/2014; DG CHEST 2 VIEW dated 06/11/2012  FINDINGS: The heart size and mediastinal contours are within normal limits. There is no evidence of pulmonary edema, consolidation, pneumothorax, nodule or pleural fluid. Stable degenerative changes are present in the thoracic spine.  IMPRESSION: No active disease.   Electronically Signed  By: Aletta Edouard M.D.   On: 01/05/2014 08:11   Medications: I have reviewed the patient's current medications. Scheduled Meds: . aspirin EC  81 mg Oral Daily  . atorvastatin  80 mg Oral q1800  . bisacodyl  5 mg Oral Once  . calcium-vitamin D  1 tablet Oral Q breakfast  . Chlorhexidine Gluconate Cloth  6 each Topical Once   And  . Chlorhexidine Gluconate Cloth  6 each Topical Once  . cycloSPORINE  1 drop Both Eyes BID  . ferrous sulfate  325 mg Oral Q breakfast  . fluticasone  2 spray Each Nare Daily  . hydrochlorothiazide  12.5 mg Oral Daily  . insulin aspart  0-20 Units Subcutaneous TID WC  . insulin aspart  0-5 Units Subcutaneous QHS  . irbesartan  300 mg Oral Daily  . metoprolol tartrate  12.5 mg Oral Once  . metoprolol tartrate  25 mg Oral BID  . multivitamin with minerals  1 tablet Oral Once per day on Mon Wed Fri  . pantoprazole  80 mg Oral Q1200  . pneumococcal 23 valent vaccine  0.5 mL Intramuscular Tomorrow-1000  . senna-docusate  1 tablet Oral  Daily  . vitamin C  250 mg Oral Q breakfast   Continuous Infusions: . heparin 2,350 Units/hr (01/06/14 0027)   PRN Meds:.acetaminophen, albuterol, ALPRAZolam, alum & mag hydroxide-simeth, lactulose, nitroGLYCERIN, ondansetron (ZOFRAN) IV, oxyCODONE Assessment/Plan:  Severe 3 vessel CARD involving the LAD, CFx and RCA - Cardiology following -Continue ASA - Metoprolol - D/C Heparin -NTG SL PRN -CVTS consult  (Dr. Prescott Gum) plan for CABG on 2/17.  Patient undergoing PreOp workup with Carotid dopplers, lower extremity dopplers.    HYPERTENSION - HCTZ 25 daily - Irbesartan 300mg  daily - Metoprolol 25 bid  Possible Zoster outbreak on right back - Start Valacyclovir 1 g TID - Continue Oxycodone - Contact precautions  Constipation - Likely secondary to oxycodone.  - Continue Sennokot-S - Miralax daily PRN  HLD - Continue Atorvastatin  Asthma No acute issues Albuterol PRN  Normocytic anemia - No active bleeding workup as outpatient.  DVT PPx: Heparin [place] Diet: Carb mod Dispo: Disposition is deferred at this time, awaiting improvement of current medical problems.   The patient does have a current PCP (Madilyn Fireman, MD) and does need an [facility] hospital follow-up appointment after discharge.  The patient does not have transportation limitations that hinder transportation to clinic appointments.  .Services Needed at time of discharge: Y = Yes, Blank = No PT:   OT:   RN:   Equipment:   Other:     LOS: 5 days   Joni Reining, DO 01/06/2014, 9:14 AM

## 2014-01-06 NOTE — Progress Notes (Signed)
SUBJECTIVE: The patient is doing well today.  She has several small blisters on her R upper back which are painful.  At this time, she denies chest pain, shortness of breath, or any new concerns.  Marland Kitchen aspirin EC  81 mg Oral Daily  . atorvastatin  80 mg Oral q1800  . bisacodyl  5 mg Oral Once  . calcium-vitamin D  1 tablet Oral Q breakfast  . Chlorhexidine Gluconate Cloth  6 each Topical Once   And  . Chlorhexidine Gluconate Cloth  6 each Topical Once  . cycloSPORINE  1 drop Both Eyes BID  . ferrous sulfate  325 mg Oral Q breakfast  . fluticasone  2 spray Each Nare Daily  . hydrochlorothiazide  25 mg Oral Daily  . insulin aspart  0-20 Units Subcutaneous TID WC  . insulin aspart  0-5 Units Subcutaneous QHS  . irbesartan  300 mg Oral Daily  . metoprolol tartrate  12.5 mg Oral Once  . metoprolol tartrate  25 mg Oral BID  . multivitamin with minerals  1 tablet Oral Once per day on Mon Wed Fri  . pantoprazole  80 mg Oral Q1200  . pneumococcal 23 valent vaccine  0.5 mL Intramuscular Tomorrow-1000  . potassium chloride  40 mEq Oral Once  . senna-docusate  1 tablet Oral Daily  . vitamin C  250 mg Oral Q breakfast      OBJECTIVE: Physical Exam: Filed Vitals:   01/05/14 1553 01/05/14 1635 01/05/14 1955 01/06/14 0417  BP: 153/70 166/97 171/95 160/80  Pulse: 72 72 78 65  Temp: 98.3 F (36.8 C) 97.5 F (36.4 C) 99.1 F (37.3 C) 98.7 F (37.1 C)  TempSrc: Oral Oral Oral Oral  Resp: 16 20 21 20   Height:      Weight:      SpO2: 98% 97% 97% 98%    Intake/Output Summary (Last 24 hours) at 01/06/14 0951 Last data filed at 01/05/14 1400  Gross per 24 hour  Intake    379 ml  Output      0 ml  Net    379 ml    Telemetry reveals sinus rhythm  GEN- The patient is morbidly obese appearing, alert and oriented x 3 today.   Head- normocephalic, atraumatic Eyes-  Sclera clear, conjunctiva pink Ears- hearing intact Oropharynx- clear Neck- supple,   Lungs- Clear to ausculation  bilaterally, normal work of breathing Heart- Regular rate and rhythm, no murmurs, rubs or gallops, PMI not laterally displaced GI- soft, NT, ND, + BS Extremities- no clubbing, cyanosis, or edema Skin- no rash or lesion Psych- euthymic mood, full affect Neuro- strength and sensation are intact  LABS: Basic Metabolic Panel:  Recent Labs  01/06/14 0550  NA 141  K 3.5*  CL 100  CO2 27  GLUCOSE 111*  BUN 10  CREATININE 0.78  CALCIUM 9.5   Liver Function Tests: No results found for this basename: AST, ALT, ALKPHOS, BILITOT, PROT, ALBUMIN,  in the last 72 hours No results found for this basename: LIPASE, AMYLASE,  in the last 72 hours CBC:  Recent Labs  01/05/14 1010 01/06/14 0550  WBC 7.1 7.3  HGB 11.2* 11.1*  HCT 33.0* 32.8*  MCV 79.5 80.0  PLT 239 252   ASSESSMENT AND PLAN:  Principal Problem:   Positive cardiac stress test Active Problems:   SLEEP APNEA, OBSTRUCTIVE, MILD   HYPERTENSION   Anginal chest pain at rest   Right shoulder pain   Intermediate coronary syndrome  Coronary atherosclerosis of native coronary artery  1. ACS Planned for CABG on Tuesday am Stop heparin as she remains chest pain free  2. HTN Above goal Increase hctz to 25mg  dialy  3. Hypokalemia Replete K  4. Obesity Weight loss is essential to her short and long term well being   Thompson Grayer, MD 01/06/2014 9:51 AM

## 2014-01-06 NOTE — Progress Notes (Addendum)
Pt c/o of back pain or rash on her back, on assessment there seems to be a few pimples but no signs of a rash of reaction.  Applied ice to area and administered tylenol per pt request.  Pt told to notify RN if having CP or SOB.  Will continue to monitor closely.  Call bell within reach.

## 2014-01-06 NOTE — ED Provider Notes (Signed)
Medical screening examination/treatment/procedure(s) were performed by non-physician practitioner and as supervising physician I was immediately available for consultation/collaboration.      Tanna Furry, MD 01/06/14 [phone]

## 2014-01-06 NOTE — Progress Notes (Signed)
2 Days Post-Op Procedure(s) (LRB): LEFT HEART CATHETERIZATION WITH CORONARY ANGIOGRAM (N/A) Subjective:  Severe three-vessel CAD now stable in the CCU Preoperative echocardiogram reviewed showing good LV function no significant valvular disease Vein mapping shows adequate vein in the right leg, no evidence of DVT Plan CABG 7 AM February 17 Objective: Vital signs in last 24 hours: Temp:  [97.5 F (36.4 C)-99.1 F (37.3 C)] 98.7 F (37.1 C) (02/15 0417) Pulse Rate:  [65-78] 65 (02/15 0417) Cardiac Rhythm:  [-] Normal sinus rhythm (02/14 2028) Resp:  [16-21] 20 (02/15 0417) BP: (153-171)/(70-97) 160/80 mmHg (02/15 0417) SpO2:  [97 %-98 %] 98 % (02/15 0417)  Hemodynamic parameters for last 24 hours:   stable  Intake/Output from previous day: 02/14 0701 - 02/15 0700 In: 632 [P.O.:460; I.V.:172] Out: -  Intake/Output this shift:    Exam No groin hematoma  Lab Results:  Recent Labs  01/05/14 1010 01/06/14 0550  WBC 7.1 7.3  HGB 11.2* 11.1*  HCT 33.0* 32.8*  PLT 239 252   BMET:  Recent Labs  01/06/14 0550  NA 141  K 3.5*  CL 100  CO2 27  GLUCOSE 111*  BUN 10  CREATININE 0.78  CALCIUM 9.5    PT/INR:  Recent Labs  01/04/14 0426  LABPROT 13.5  INR 1.05   ABG    Component Value Date/Time   PHART 7.428 01/05/2014 0424   HCO3 27.3* 01/05/2014 0424   TCO2 28.6 01/05/2014 0424   O2SAT 93.9 01/05/2014 0424   CBG (last 3)   Recent Labs  01/05/14 2135 01/06/14 0610 01/06/14 1143  GLUCAP 115* 133* 122*    Assessment/Plan: S/P Procedure(s) (LRB): LEFT HEART CATHETERIZATION WITH CORONARY ANGIOGRAM (N/A) Plan CABG February 17 for severe three-vessel CAD and unstable angina   LOS: 5 days    VAN TRIGT III,[place]one[place] Gaut 01/06/2014

## 2014-01-07 ENCOUNTER — Encounter (HOSPITAL_COMMUNITY): Payer: Self-pay | Admitting: Certified Registered Nurse Anesthetist

## 2014-01-07 ENCOUNTER — Other Ambulatory Visit: Payer: Self-pay | Admitting: *Deleted

## 2014-01-07 ENCOUNTER — Encounter (HOSPITAL_COMMUNITY): Payer: Medicare Other

## 2014-01-07 DIAGNOSIS — K5900 Constipation, unspecified: Secondary | ICD-10-CM

## 2014-01-07 DIAGNOSIS — L282 Other prurigo: Secondary | ICD-10-CM

## 2014-01-07 DIAGNOSIS — D649 Anemia, unspecified: Secondary | ICD-10-CM

## 2014-01-07 DIAGNOSIS — Z01810 Encounter for preprocedural cardiovascular examination: Secondary | ICD-10-CM

## 2014-01-07 DIAGNOSIS — E669 Obesity, unspecified: Secondary | ICD-10-CM

## 2014-01-07 DIAGNOSIS — M549 Dorsalgia, unspecified: Secondary | ICD-10-CM

## 2014-01-07 DIAGNOSIS — I2510 Atherosclerotic heart disease of native coronary artery without angina pectoris: Secondary | ICD-10-CM

## 2014-01-07 LAB — CBC
HCT: 34.9 % — ABNORMAL LOW (ref 36.0–46.0)
Hemoglobin: 11.8 g/dL — ABNORMAL LOW (ref 12.0–15.0)
MCH: 27.1 pg (ref 26.0–34.0)
MCHC: 33.8 g/dL (ref 30.0–36.0)
MCV: 80 fL (ref 78.0–100.0)
Platelets: 207 10*3/uL (ref 150–400)
RBC: 4.36 MIL/uL (ref 3.87–5.11)
RDW: 16.2 % — ABNORMAL HIGH (ref 11.5–15.5)
WBC: 6.3 10*3/uL (ref 4.0–10.5)

## 2014-01-07 LAB — GLUCOSE, CAPILLARY
Glucose-Capillary: 110 mg/dL — ABNORMAL HIGH (ref 70–99)
Glucose-Capillary: 104 mg/dL — ABNORMAL HIGH (ref 70–99)
Glucose-Capillary: 111 mg/dL — ABNORMAL HIGH (ref 70–99)
Glucose-Capillary: 111 mg/dL — ABNORMAL HIGH (ref 70–99)

## 2014-01-07 MED ORDER — DEXMEDETOMIDINE HCL IN NACL 400 MCG/100ML IV SOLN
0.1000 ug/kg/h | INTRAVENOUS | Status: DC
  Filled 2014-01-07: qty 100

## 2014-01-07 MED ORDER — PHENYLEPHRINE HCL 10 MG/ML IJ SOLN
30.0000 ug/min | INTRAVENOUS | Status: DC
  Filled 2014-01-07: qty 2

## 2014-01-07 MED ORDER — POTASSIUM CHLORIDE 2 MEQ/ML IV SOLN
80.0000 meq | INTRAVENOUS | Status: DC
  Filled 2014-01-07: qty 40

## 2014-01-07 MED ORDER — VANCOMYCIN HCL 10 G IV SOLR
1500.0000 mg | INTRAVENOUS | Status: DC
  Filled 2014-01-07: qty 1500

## 2014-01-07 MED ORDER — MAGNESIUM SULFATE 50 % IJ SOLN
40.0000 meq | INTRAMUSCULAR | Status: DC
  Filled 2014-01-07: qty 10

## 2014-01-07 MED ORDER — SODIUM CHLORIDE 0.9 % IV SOLN
INTRAVENOUS | Status: DC
  Filled 2014-01-07: qty 40

## 2014-01-07 MED ORDER — LEVOFLOXACIN IN D5W 500 MG/100ML IV SOLN
500.0000 mg | INTRAVENOUS | Status: DC
  Filled 2014-01-07: qty 100

## 2014-01-07 MED ORDER — NITROGLYCERIN IN D5W 200-5 MCG/ML-% IV SOLN
2.0000 ug/min | INTRAVENOUS | Status: DC
Start: 2014-01-08 — End: 2014-01-07

## 2014-01-07 MED ORDER — PLASMA-LYTE 148 IV SOLN
INTRAVENOUS | Status: DC
  Filled 2014-01-07: qty 2.5

## 2014-01-07 MED ORDER — SODIUM CHLORIDE 0.9 % IV SOLN
INTRAVENOUS | Status: DC
Start: 2014-01-08 — End: 2014-01-07
  Filled 2014-01-07: qty 30

## 2014-01-07 MED ORDER — EPINEPHRINE HCL 1 MG/ML IJ SOLN
0.5000 ug/min | INTRAVENOUS | Status: DC
  Filled 2014-01-07: qty 4

## 2014-01-07 MED ORDER — SODIUM CHLORIDE 0.9 % IV SOLN
INTRAVENOUS | Status: DC
  Filled 2014-01-07: qty 1

## 2014-01-07 MED ORDER — GABAPENTIN 300 MG PO CAPS
300.0000 mg | ORAL_CAPSULE | Freq: Once | ORAL | Status: AC
  Administered 2014-01-07: 300 mg via ORAL
  Filled 2014-01-07: qty 1

## 2014-01-07 MED ORDER — IBUPROFEN 600 MG PO TABS
600.0000 mg | ORAL_TABLET | Freq: Four times a day (QID) | ORAL | Status: DC | PRN
  Filled 2014-01-07: qty 1

## 2014-01-07 MED ORDER — DOPAMINE-DEXTROSE 3.2-5 MG/ML-% IV SOLN
2.0000 ug/kg/min | INTRAVENOUS | Status: DC
  Filled 2014-01-07: qty 250

## 2014-01-07 NOTE — Progress Notes (Signed)
Patient seen and examined with resident team.  Agree with exam, assessment and plan.  Concern for shingles with neuropathic pain on right shoulder but lesions not consistent with shingles.  Lesions noted are 3 papillary bumps.  No vesicles noted.  Will d/c therapy for zoster and isolation.  Dr. Nils Pyle to consider CABG 2/18.  Appreciate his input.  Scharlene Gloss, MD

## 2014-01-07 NOTE — Progress Notes (Signed)
Patient placed on contact precautions for suspected / rule-out shingles. However, patient also needs to be placed on air-borne precautions and negative pressure room per hospital policy for airborne virus. Infections Disease is supposed to round on this patient, however nobody has seen her this morning. Meanwhile, Will initiate airborne precautions for protection of staff and other patients.  [name]

## 2014-01-07 NOTE — Progress Notes (Signed)
CARDIAC REHAB PHASE I   PRE:  Rate/Rhythm: 94 SR  BP:  Supine:   Sitting: 110/80  Standing:    SaO2: 99 RA  MODE:  Ambulation: 480 ft   POST:  Rate/Rhythm: 96  BP:  Supine:   Sitting: 150/100  Standing:    SaO2: 98 RA 0930-1030 Assisted X 1 and used walker to ambulate. Gait steady with walker.Pt able to walk 480 feet without c/o of cp or SOB. Pt states that she did not sleep last night due to pain in her right shoulder. Pt to side of bed after walk with call light in reach. Did more pre-op education with pt and answered her questions.  Rodney Langton RN 01/07/2014 10:37 AM

## 2014-01-07 NOTE — Care Management Note (Unsigned)
    Page 1 of 2   01/18/2014     3:51:28 PM   CARE MANAGEMENT NOTE 01/18/2014  Patient:  [name], [name]   Account Number:  [id number]  Date Initiated:  01/07/2014  Documentation initiated by:  Naleah Kofoed  Subjective/Objective Assessment:   PT ADM ON 2/10 WITH CP, POS STRESS TEST.  PTA, PT INDEPENDENT, LIVES ALONE.     Action/Plan:   PT NEEDS CABG, BUT NOW WITH ? SHINGLES.  CABG ON HOLD UNTIL LESIONS IMPROVED.  WILL FOLLOW PROGRESS.   Anticipated DC Date:  01/21/2014   Anticipated DC Plan:  SKILLED NURSING FACILITY  In-house referral  Clinical Social Worker      DC Planning Services  CM consult      Choice offered to / List presented to:             Status of service:  In process, will continue to follow Medicare Important Message given?   (If response is "NO", the following Medicare IM given date fields will be blank) Date Medicare IM given:   Date Additional Medicare IM given:    Discharge Disposition:    Per UR Regulation:  Reviewed for med. necessity/level of care/duration of stay  If discussed at [place] of Stay Meetings, dates discussed:    Comments:  Contact:  Kahre,Dena Daughter [phone]   [phone]                 Myriam Forehand       [phone]  01/18/14 Nikitta Sobiech,RN,BSN [phone] PT WAS DC TO SNF TODAY, BUT DEVELOPED CP AFTER VOMITING. DC HELD FOR TODAY.  01-14-14 [place], [place] SW consult placed.   PT recommending higher level.  01-09-14 3:45pm Luz Lex, RNBSN [phone] Post op CABG x5 today. Daughter in room - states neighbors are planning on being with patient 24/7 on discharge.  may need higher level on dc.

## 2014-01-07 NOTE — Progress Notes (Addendum)
3 Days Post-Op Procedure(s) (LRB): LEFT HEART CATHETERIZATION WITH CORONARY ANGIOGRAM (N/A) Subjective:  painful zoster on R post chest Will delay CABG until symptoms improve Could see in office next week to sched CABG if she is cleared for Discharge by cardiology- or will do CABG later this hospitalization Objective: Vital signs in last 24 hours: Temp:  [98.1 F (36.7 C)-98.7 F (37.1 C)] 98.6 F (37 C) (02/16 0442) Pulse Rate:  [73-75] 73 (02/16 0442) Cardiac Rhythm:  [-] Normal sinus rhythm (02/16 0800) Resp:  [18-19] 18 (02/16 0442) BP: (155-179)/(81-96) 155/88 mmHg (02/16 0608) SpO2:  [97 %-98 %] 98 % (02/16 0442) Weight:  [320 lb 15.8 oz (145.6 kg)] 320 lb 15.8 oz (145.6 kg) (02/16 0442)  Hemodynamic parameters for last 24 hours:   stable Intake/Output from previous day: 02/15 0701 - 02/16 0700 In: 120 [P.O.:120] Out: -  Intake/Output this shift:   EXAM nsr  Lab Results:  Recent Labs  01/06/14 0550 01/07/14 0530  WBC 7.3 6.3  HGB 11.1* 11.8*  HCT 32.8* 34.9*  PLT 252 207   BMET:  Recent Labs  01/06/14 0550  NA 141  K 3.5*  CL 100  CO2 27  GLUCOSE 111*  BUN 10  CREATININE 0.78  CALCIUM 9.5    PT/INR: No results found for this basename: LABPROT, INR,  in the last 72 hours ABG    Component Value Date/Time   PHART 7.428 01/05/2014 0424   HCO3 27.3* 01/05/2014 0424   TCO2 28.6 01/05/2014 0424   O2SAT 93.9 01/05/2014 0424   CBG (last 3)   Recent Labs  01/06/14 2110 01/07/14 0610 01/07/14 1107  GLUCAP 114* 110* 104*    Assessment/Plan: S/P Procedure(s) (LRB): LEFT HEART CATHETERIZATION WITH CORONARY ANGIOGRAM (N/A) Delay CABG until symptoms improve   LOS: 6 days    VAN TRIGT III,PETER 01/07/2014  Patient had an episode of angina todAY GOING TO BATHROOM. Will sched CABG , add to OR schedule Wed 2-18. PRN NTG ordered

## 2014-01-07 NOTE — Progress Notes (Signed)
Internal Medicine Teaching Service Daily Progress Note  Subjective: Patient reports sharp pains last night in her right upper back to shoulder.   In addition she reports one episode of chest pressure this morning. Was given SL nitro and pressure resolved. Objective: Vital signs in last 24 hours: Filed Vitals:   01/07/14 0442 01/07/14 0452 01/07/14 0608 01/07/14 1401  BP: 179/84 164/88 155/88 152/80  Pulse: 73     Temp: 98.6 F (37 C)   98.4 F (36.9 C)  TempSrc: Oral   Oral  Resp: 18   18  Height:      Weight: 320 lb 15.8 oz (145.6 kg)     SpO2: 98%   98%   Weight change:   Intake/Output Summary (Last 24 hours) at 01/07/14 1451 Last data filed at 01/07/14 1402  Gross per 24 hour  Intake    240 ml  Output      0 ml  Net    240 ml   General: resting in bed, NAD Cardiac: RRR, no rubs, murmurs or gallops Pulm: clear to auscultation bilaterally, moving normal volumes of air Abd: obese, soft, nontender, nondistended, BS present Ext: warm and well perfused, trace pedal edema Back: 4 small pustules on the right at about the level of T5 on the right.   Lab Results: Basic Metabolic Panel:  Recent Labs Lab 01/01/14 2020 01/06/14 0550  NA 141 141  K 3.9 3.5*  CL 101 100  CO2 26 27  GLUCOSE 129* 111*  BUN 12 10  CREATININE 0.96 0.78  CALCIUM 9.3 9.5   CBC:  Recent Labs Lab 01/06/14 0550 01/07/14 0530  WBC 7.3 6.3  HGB 11.1* 11.8*  HCT 32.8* 34.9*  MCV 80.0 80.0  PLT 252 207   Cardiac Enzymes:  Recent Labs Lab 01/02/14 1256 01/02/14 1715 01/02/14 2245  TROPONINI <0.30 <0.30 <0.30   Micro Results: Recent Results (from the past 240 hour(s))  SURGICAL PCR SCREEN     Status: None   Collection Time    01/04/14  6:39 PM      Result Value Ref Range Status   MRSA, PCR NEGATIVE  NEGATIVE Final   Staphylococcus aureus NEGATIVE  NEGATIVE Final   Comment:            The Xpert SA Assay (FDA     approved for NASAL specimens     in patients over 21 years of  age),     is one component of     a comprehensive surveillance     program.  Test performance has     been validated by [employer] for patients greater     than or equal to 13 year old.     It is not intended     to diagnose infection nor to     guide or monitor treatment.   Studies/Results: No results found. Medications: I have reviewed the patient's current medications. Scheduled Meds: . aspirin EC  81 mg Oral Daily  . atorvastatin  80 mg Oral q1800  . bisacodyl  5 mg Oral Once  . calcium-vitamin D  1 tablet Oral Q breakfast  . cycloSPORINE  1 drop Both Eyes BID  . ferrous sulfate  325 mg Oral Q breakfast  . fluticasone  2 spray Each Nare Daily  . heparin subcutaneous  5,000 Units Subcutaneous 3 times per day  . hydrochlorothiazide  25 mg Oral Daily  . insulin aspart  0-20 Units Subcutaneous TID  WC  . insulin aspart  0-5 Units Subcutaneous QHS  . irbesartan  300 mg Oral Daily  . metoprolol tartrate  25 mg Oral BID  . multivitamin with minerals  1 tablet Oral Once per day on Mon Wed Fri  . pantoprazole  80 mg Oral Q1200  . pneumococcal 23 valent vaccine  0.5 mL Intramuscular Tomorrow-1000  . senna-docusate  1 tablet Oral Daily  . valACYclovir  1,000 mg Oral TID  . vitamin C  250 mg Oral Q breakfast   Continuous Infusions:   PRN Meds:.acetaminophen, albuterol, ALPRAZolam, alum & mag hydroxide-simeth, lactulose, nitroGLYCERIN, ondansetron (ZOFRAN) IV, oxyCODONE, polyethylene glycol Assessment/Plan:  Severe 3 vessel CARD involving the LAD, CFx and RCA - Cardiology following -Continue ASA - Metoprolol -NTG SL PRN -CVTS consult  (Dr. Prescott Gum) had originally planned for CABG on 2/17. This on hold due to concern for shingles/ uncontrolled right back pain.     HYPERTENSION - HCTZ 25 daily - Irbesartan 300mg  daily - Metoprolol 25 bid  Right back and shoulder pain - Evaluated by Dr. Linus Salmons (ID) rash on back does not appear typical of Zoster - D/C Valacyclovir 1 g  TID - D/C Precautions - Continue Oxycodone  Constipation - Likely secondary to oxycodone.  - Continue Sennokot-S - Miralax daily PRN  HLD - Continue Atorvastatin  Asthma No acute issues Albuterol PRN  Normocytic anemia - No active bleeding workup as outpatient.  DVT PPx: Heparin [place] Diet: Carb mod Dispo: Disposition is deferred at this time, awaiting improvement of current medical problems. Given patient's angina at risk not safe for discharge. Will need to keep while awaiting CABG.  The patient does have a current PCP (Madilyn Fireman, MD) and does need an [facility] hospital follow-up appointment after discharge.  The patient does not have transportation limitations that hinder transportation to clinic appointments.  .Services Needed at time of discharge: Y = Yes, Blank = No PT:   OT:   RN:   Equipment:   Other:     LOS: 6 days   Joni Reining, DO 01/07/2014, 2:51 PM

## 2014-01-07 NOTE — Progress Notes (Signed)
Pt complaining of sharp back/right shoulder pain.  Pt states current pain medication does not help.  MD on call notified.  MD to unit to assess patient and her pain.  New orders received.  Will continue to monitor pt closely.  Call bell within reach.  Pt educated to notify nurse if having CP or SOB.

## 2014-01-07 NOTE — Progress Notes (Signed)
Patient Name: [name] Date of Encounter: 01/07/2014     Principal Problem:   Positive cardiac stress test Active Problems:   SLEEP APNEA, OBSTRUCTIVE, MILD   HYPERTENSION   Anginal chest pain at rest   Right shoulder pain   Intermediate coronary syndrome   Coronary atherosclerosis of native coronary artery    SUBJECTIVE  No CP or SOB. She complains of severe pain in right upper shoulder area.  CURRENT MEDS . aspirin EC  81 mg Oral Daily  . atorvastatin  80 mg Oral q1800  . bisacodyl  5 mg Oral Once  . calcium-vitamin D  1 tablet Oral Q breakfast  . Chlorhexidine Gluconate Cloth  6 each Topical Once  . cycloSPORINE  1 drop Both Eyes BID  . ferrous sulfate  325 mg Oral Q breakfast  . fluticasone  2 spray Each Nare Daily  . heparin subcutaneous  5,000 Units Subcutaneous 3 times per day  . hydrochlorothiazide  25 mg Oral Daily  . insulin aspart  0-20 Units Subcutaneous TID WC  . insulin aspart  0-5 Units Subcutaneous QHS  . irbesartan  300 mg Oral Daily  . metoprolol tartrate  25 mg Oral BID  . multivitamin with minerals  1 tablet Oral Once per day on Mon Wed Fri  . pantoprazole  80 mg Oral Q1200  . pneumococcal 23 valent vaccine  0.5 mL Intramuscular Tomorrow-1000  . senna-docusate  1 tablet Oral Daily  . valACYclovir  1,000 mg Oral TID  . vitamin C  250 mg Oral Q breakfast    OBJECTIVE  Filed Vitals:   01/06/14 2000 01/07/14 0442 01/07/14 0452 01/07/14 0608  BP: 179/96 179/84 164/88 155/88  Pulse: 75 73    Temp: 98.7 F (37.1 C) 98.6 F (37 C)    TempSrc: Oral Oral    Resp: 18 18    Height:      Weight:  320 lb 15.8 oz (145.6 kg)    SpO2: 98% 98%      Intake/Output Summary (Last 24 hours) at 01/07/14 0753 Last data filed at 01/06/14 1300  Gross per 24 hour  Intake    120 ml  Output      0 ml  Net    120 ml   Filed Weights   01/02/14 0500 01/04/14 1900 01/07/14 0442  Weight: 319 lb 14.4 oz (145.106 kg) 332 lb 10.8 oz (150.9 kg) 320 lb 15.8 oz  (145.6 kg)    PHYSICAL EXAM  General: Pleasant, NAD. Obese  Neuro: Alert and oriented X 3. Moves all extremities spontaneously. Psych: Normal affect. HEENT:  Normal  Neck: Supple without bruits or JVD. Lungs:  Resp regular and unlabored, CTA. Heart: RRR no s3, s4, or murmurs. Abdomen: Soft, non-tender, non-distended, BS + x 4.  Extremities: No clubbing, cyanosis or edema. DP/PT/Radials 2+ and equal bilaterally. BACK- Right posterior shoulder with flesh colored lesions, severe pain to touch  Accessory Clinical Findings  CBC  Recent Labs  01/06/14 0550 01/07/14 0530  WBC 7.3 6.3  HGB 11.1* 11.8*  HCT 32.8* 34.9*  MCV 80.0 80.0  PLT 252 096   Basic Metabolic Panel  Recent Labs  01/06/14 0550  NA 141  K 3.5*  CL 100  CO2 27  GLUCOSE 111*  BUN 10  CREATININE 0.78  CALCIUM 9.5    TELE  NSR  ECG  TWI in inferolat leads  Radiology/Studies Left Heart Catheterization with Coronary Angiography Report  [name]  58  y.o.  female  1956/02/26  Procedure Date: 01/04/2014  Referring Physician: C. Tommy Medal, MD  Primary Cardiologist: Gwenlyn Found, MD  INDICATIONS: Class 3 angina and abnormal myocardial perfusion study  PROCEDURE: 1. Left heart cath; 2. Coronary angiography; 3. Left ventriculography  CONSENT:  The risks, benefits, and details of the procedure were explained in detail to the patient. Risks including death, stroke, heart attack, kidney injury, allergy, limb ischemia, bleeding and radiation injury were discussed. The patient verbalized understanding and wanted to proceed. Informed written consent was obtained.  PROCEDURE TECHNIQUE: After Xylocaine anesthesia a 24F Slender sheath was placed in the right radial artery with a single double wall needle stick. Coronary angiography was done using a 24F JR4 and JL3.5 catheters. Left ventriculography was done using the JR4 catheter and hand injection.  Hemostasis achieved with the wrist band without complications    MEDICATIONS: Versed 1 mg IV and Fentanyl 50 micrograms IV  CONTRAST: Total of 90 cc.  COMPLICATIONS: none  HEMODYNAMICS: Aortic pressure 186/93 mmHg; LV pressure 190/21 mmHg; LVEDP 27 mmHg  ANGIOGRAPHIC DATA: The left main coronary artery is widely patent.  The left anterior descending artery is is moderately calcified proximally. There is severe proximal to mid 90% stenosis. The LAD is transapical.  The left circumflex artery is highly diseased. There is 80-90% stenosis of two large marginal branches and high grade mid circumflex stenosis.  The right coronary artery is dominant and contains 95% stenosis distally before the origin of the PDA.  LEFT VENTRICULOGRAM: Left ventricular angiogram was done in the 30 RAO projection and revealed normal contractility with EF 60%.  IMPRESSIONS: 1. Severe 3 vessel CAD involving the LAD, Cfx, and RCA  2. Preserved LV systolic function.  RECOMMENDATION: Consider CABG. Her weight may be a problem with bypass. If so, RCA and LAD could be stented. The circumflex would be more difficult to treat percutaneously..   Dg Chest 2 View  01/05/2014   CLINICAL DATA:  Coronary artery disease. Preoperative respiratory exam.  EXAM: CHEST - 2 VIEW  COMPARISON:  DG CHEST 2 VIEW dated 01/01/2014; DG CHEST 2 VIEW dated 06/11/2012  FINDINGS: The heart size and mediastinal contours are within normal limits. There is no evidence of pulmonary edema, consolidation, pneumothorax, nodule or pleural fluid. Stable degenerative changes are present in the thoracic spine.  IMPRESSION: No active disease.    Nm Myocar Multi W/spect W/wall Motion / Ef  01/03/2014   CLINICAL DATA:  58 year old female with chest pain with  EXAM: MYOCARDIAL IMAGING WITH SPECT (REST AND PHARMACOLOGIC-STRESS - 2 DAY PROTOCOL)  GATED LEFT VENTRICULAR WALL MOTION STUDY  LEFT VENTRICULAR EJECTION FRACTION  TECHNIQUE: Standard myocardial SPECT imaging was performed after resting intravenous injection of 30 mCi Tc-68m  sestamibi. Subsequently, on a second day, intravenous infusion of Lexiscan was performed under the supervision of the Cardiology staff. At peak effect of the drug, 30 mCi Tc-73m sestamibi was injected intravenously and standard myocardial SPECT imaging was performed. Quantitative gated imaging was also performed to evaluate left ventricular wall motion, and estimate left ventricular ejection fraction.  COMPARISON:  None.  FINDINGS: Perfusion: There is some motion degradation on this 2 day study. 5+ breast size additionally.  There are decreased counts within the inferior lateral wall and lateral wall involving the mid and basilar segments which improved from stress to rest. This is concerning for ischemia of the lateral wall.  There is apparent fixed defect within the anterior septal wall apex with mild reversibility which may be due  to motion.  Wall Motion: Motion artifact makes evaluation wall motion difficult.  Ejection Fraction: [id number] %  End-diastolic volume equals: [id number] mL Ml  End systolic volume equals: 53 mL Ml  IMPRESSION:  1. Concern for ischemia of the mid and basilar segments of the inferolateral and lateral wall. 2. Fixed defect with mild reversibility within the anterior septal wall versus motion artifact. 3. Wall motion is difficult to assess due to motion artifact. 4. Left ventricular ejection fraction equals 52% These results will be called to the ordering clinician or representative by the Radiologist Assistant, and communication documented in the PACS Dashboard.      ASSESSMENT AND PLAN The patient is a 58 yo morbidly obese female with a history of difficult to control HTN, HLD, DM, GERD, OSA, and uterine bleeding who presented with "squeezing" chest pain on 01/01/14. She had LHC showing severe three-vessel coronary disease and good LV function without significant valvular disease. Vein mapping shows adequate vein in the right leg, no evidence of DVT. She is awaiting CABG scheduled for  tomorrow.  Severe 3 vessel CARD involving the LAD, CFx and RCA  -Continue ASA  - Metoprolol  -NTG SL PRN  -CVTS conducting pre-op eval.   HYPERTENSION - still high.  - HCTZ 25 daily  - Irbesartan 300mg  daily  - Metoprolol 25 bid   Hypokalemia  -- BMET today pending  Obesity  Weight loss is essential to her short and long term well being  HLD  - Continue Atorvastatin    Signed, Perry Mount PA-C  Pager [phone]  I have examined the patient and reviewed assessment and plan and discussed with patient.  Agree with above as stated.  CABG tomorrow.  Question shingles on the right shoulder? Will defer to primary team.  Graceanne Guin S.

## 2014-01-07 NOTE — Progress Notes (Signed)
Infectious disease rounded on patient, based on their assessment, MD does not suspect that it is shingles. Precautions will be discontinued per MD order. Elmarie Shiley R

## 2014-01-08 ENCOUNTER — Encounter (HOSPITAL_COMMUNITY): Payer: Self-pay | Admitting: General Practice

## 2014-01-08 ENCOUNTER — Inpatient Hospital Stay (HOSPITAL_COMMUNITY): Payer: Medicare Other

## 2014-01-08 ENCOUNTER — Encounter (HOSPITAL_COMMUNITY)
Admission: EM | Disposition: A | Discharge status: Skilled Nursing Facility | Payer: Self-pay | Source: Home / Self Care | Attending: Cardiothoracic Surgery

## 2014-01-08 DIAGNOSIS — K089 Disorder of teeth and supporting structures, unspecified: Secondary | ICD-10-CM

## 2014-01-08 LAB — TYPE AND SCREEN
ABO/RH(D): O POS
Antibody Screen: NEGATIVE
Unit division: 0
Unit division: 0
Unit division: 0
Unit division: 0

## 2014-01-08 LAB — BASIC METABOLIC PANEL
BUN: 12 mg/dL (ref 6–23)
CO2: 29 mEq/L (ref 19–32)
Calcium: 9.7 mg/dL (ref 8.4–10.5)
Chloride: 97 mEq/L (ref 96–112)
Creatinine, Ser: 0.97 mg/dL (ref 0.50–1.10)
GFR calc Af Amer: 73 mL/min — ABNORMAL LOW (ref >90)
GFR calc non Af Amer: 63 mL/min — ABNORMAL LOW (ref >90)
Glucose, Bld: 151 mg/dL — ABNORMAL HIGH (ref 70–99)
Potassium: 3.8 mEq/L (ref 3.7–5.3)
Sodium: 140 mEq/L (ref 137–147)

## 2014-01-08 LAB — PULMONARY FUNCTION TEST
DL/VA % pred: 96 %
DL/VA: 5.06 ml/min/mmHg/L
DLCO cor % pred: 70 %
DLCO cor: 20.76 ml/min/mmHg
DLCO unc % pred: 64 %
DLCO unc: 19.21 ml/min/mmHg
FEF 25-75 Post: 3.34 L/sec
FEF 25-75 Pre: 2.35 L/sec
FEF2575-%Change-Post: 41 %
FEF2575-%Pred-Post: 140 %
FEF2575-%Pred-Pre: 98 %
FEV1-%Change-Post: 11 %
FEV1-%Pred-Post: 102 %
FEV1-%Pred-Pre: 91 %
FEV1-Post: 2.54 L
FEV1-Pre: 2.27 L
FEV1FVC-%Change-Post: 9 %
FEV1FVC-%Pred-Pre: 99 %
FEV6-%Change-Post: 5 %
FEV6-%Pred-Post: 96 %
FEV6-%Pred-Pre: 90 %
FEV6-Post: 2.93 L
FEV6-Pre: 2.76 L
FEV6FVC-%Pred-Post: 103 %
FEV6FVC-%Pred-Pre: 103 %
FVC-%Change-Post: 2 %
FVC-%Pred-Post: 93 %
FVC-%Pred-Pre: 91 %
FVC-Post: 2.93 L
FVC-Pre: 2.85 L
Post FEV1/FVC ratio: 87 %
Post FEV6/FVC ratio: 100 %
Pre FEV1/FVC ratio: 79 %
Pre FEV6/FVC Ratio: 100 %

## 2014-01-08 LAB — URINALYSIS, ROUTINE W REFLEX MICROSCOPIC
Bilirubin Urine: NEGATIVE
Glucose, UA: NEGATIVE mg/dL
Hgb urine dipstick: NEGATIVE
Ketones, ur: NEGATIVE mg/dL
Leukocytes, UA: NEGATIVE
Nitrite: NEGATIVE
Protein, ur: NEGATIVE mg/dL
Specific Gravity, Urine: 1.011 (ref 1.005–1.030)
Urobilinogen, UA: 0.2 mg/dL (ref 0.0–1.0)
pH: 6.5 (ref 5.0–8.0)

## 2014-01-08 LAB — CBC
HEMATOCRIT: 33.4 % — AB (ref 36.0–46.0)
Hemoglobin: 11.2 g/dL — ABNORMAL LOW (ref 12.0–15.0)
MCH: 27.1 pg (ref 26.0–34.0)
MCHC: 33.5 g/dL (ref 30.0–36.0)
MCV: 80.7 fL (ref 78.0–100.0)
Platelets: 224 10*3/uL (ref 150–400)
RBC: 4.14 MIL/uL (ref 3.87–5.11)
RDW: 16.3 % — AB (ref 11.5–15.5)
WBC: 7.9 10*3/uL (ref 4.0–10.5)

## 2014-01-08 LAB — GLUCOSE, CAPILLARY
GLUCOSE-CAPILLARY: 140 mg/dL — AB (ref 70–99)
Glucose-Capillary: 109 mg/dL — ABNORMAL HIGH (ref 70–99)
Glucose-Capillary: 98 mg/dL (ref 70–99)
Glucose-Capillary: 113 mg/dL — ABNORMAL HIGH (ref 70–99)

## 2014-01-08 LAB — APTT: aPTT: 34 seconds (ref 24–37)

## 2014-01-08 SURGERY — CORONARY ARTERY BYPASS GRAFTING (CABG)
Anesthesia: General | Site: Chest

## 2014-01-08 MED ORDER — POTASSIUM CHLORIDE 2 MEQ/ML IV SOLN
80.0000 meq | INTRAVENOUS | Status: DC
  Filled 2014-01-08: qty 40

## 2014-01-08 MED ORDER — DEXTROSE 5 % IV SOLN
0.5000 ug/min | INTRAVENOUS | Status: DC
  Filled 2014-01-08: qty 4

## 2014-01-08 MED ORDER — LEVOFLOXACIN IN D5W 500 MG/100ML IV SOLN
500.0000 mg | INTRAVENOUS | Status: AC
  Administered 2014-01-09: 500 mg via INTRAVENOUS
  Filled 2014-01-08: qty 100

## 2014-01-08 MED ORDER — SODIUM CHLORIDE 0.9 % IV SOLN
INTRAVENOUS | Status: DC
  Filled 2014-01-08: qty 30

## 2014-01-08 MED ORDER — PHENYLEPHRINE HCL 10 MG/ML IJ SOLN
30.0000 ug/min | INTRAMUSCULAR | Status: AC
  Administered 2014-01-09: 15 ug/min via INTRAVENOUS
  Filled 2014-01-08: qty 2

## 2014-01-08 MED ORDER — PLASMA-LYTE 148 IV SOLN
INTRAVENOUS | Status: AC
  Administered 2014-01-09: 10:00:00
  Filled 2014-01-08: qty 2.5

## 2014-01-08 MED ORDER — CLINDAMYCIN PHOSPHATE 900 MG/50ML IV SOLN
900.0000 mg | Freq: Three times a day (TID) | INTRAVENOUS | Status: DC
  Administered 2014-01-08 (×2): 900 mg via INTRAVENOUS
  Filled 2014-01-08 (×7): qty 50

## 2014-01-08 MED ORDER — DEXMEDETOMIDINE HCL IN NACL 400 MCG/100ML IV SOLN
0.1000 ug/kg/h | INTRAVENOUS | Status: AC
  Administered 2014-01-09: 0.2 ug/kg/h via INTRAVENOUS
  Administered 2014-01-09: 15:00:00 via INTRAVENOUS
  Filled 2014-01-08: qty 100

## 2014-01-08 MED ORDER — CHLORHEXIDINE GLUCONATE CLOTH 2 % EX PADS
6.0000 | MEDICATED_PAD | Freq: Once | CUTANEOUS | Status: AC
  Administered 2014-01-09: 6 via TOPICAL

## 2014-01-08 MED ORDER — SODIUM CHLORIDE 0.9 % IV SOLN
INTRAVENOUS | Status: AC
  Administered 2014-01-09: 2.3 [IU]/h via INTRAVENOUS
  Filled 2014-01-08: qty 1

## 2014-01-08 MED ORDER — SODIUM CHLORIDE 0.9 % IV SOLN
INTRAVENOUS | Status: AC
  Administered 2014-01-09: 69.8 mL/h via INTRAVENOUS
  Administered 2014-01-09: 13:00:00 via INTRAVENOUS
  Filled 2014-01-08: qty 40

## 2014-01-08 MED ORDER — METOPROLOL TARTRATE 12.5 MG HALF TABLET
12.5000 mg | ORAL_TABLET | Freq: Once | ORAL | Status: AC
  Administered 2014-01-09: 12.5 mg via ORAL
  Filled 2014-01-08: qty 1

## 2014-01-08 MED ORDER — NITROGLYCERIN IN D5W 200-5 MCG/ML-% IV SOLN
2.0000 ug/min | INTRAVENOUS | Status: AC
  Administered 2014-01-09: 5 ug/min via INTRAVENOUS
  Filled 2014-01-08: qty 250

## 2014-01-08 MED ORDER — DIAZEPAM 5 MG PO TABS
5.0000 mg | ORAL_TABLET | Freq: Once | ORAL | Status: AC
  Administered 2014-01-09: 5 mg via ORAL
  Filled 2014-01-08: qty 1

## 2014-01-08 MED ORDER — CHLORHEXIDINE GLUCONATE CLOTH 2 % EX PADS
6.0000 | MEDICATED_PAD | Freq: Once | CUTANEOUS | Status: AC
  Administered 2014-01-08: 6 via TOPICAL

## 2014-01-08 MED ORDER — VANCOMYCIN HCL 10 G IV SOLR
1250.0000 mg | INTRAVENOUS | Status: AC
  Administered 2014-01-09: 1250 mg via INTRAVENOUS
  Filled 2014-01-08: qty 1250

## 2014-01-08 MED ORDER — ALBUTEROL SULFATE (2.5 MG/3ML) 0.083% IN NEBU
2.5000 mg | INHALATION_SOLUTION | Freq: Once | RESPIRATORY_TRACT | Status: AC
  Administered 2014-01-08: 2.5 mg via RESPIRATORY_TRACT

## 2014-01-08 MED ORDER — MAGNESIUM SULFATE 50 % IJ SOLN
40.0000 meq | INTRAMUSCULAR | Status: DC
  Filled 2014-01-08: qty 10

## 2014-01-08 MED ORDER — DOPAMINE-DEXTROSE 3.2-5 MG/ML-% IV SOLN
2.0000 ug/kg/min | INTRAVENOUS | Status: AC
  Administered 2014-01-09: 3 ug/kg/min via INTRAVENOUS
  Filled 2014-01-08: qty 250

## 2014-01-08 NOTE — Progress Notes (Signed)
CARDIAC REHAB PHASE I   PRE:  Rate/Rhythm: 72 SR  BP:  Supine:   Sitting: 142/80  Standing:    SaO2: 97%RA  MODE:  Ambulation: 460 ft   POST:  Rate/Rhythm: 93  BP:  Supine:   Sitting: 146/78  Standing:    SaO2: 98%RA 1122-1150 Pt walked 460 ft on RA with rolling walker with steady gait. Stopped once to rest. No CP. Pt did c/o tooth pain which is new. Practiced with pt getting up without use of arms due to sternal precautions. Pt able to rock and get up on her own. Has had pre op ed and has seen video.   Graylon Good, RN BSN  01/08/2014 11:47 AM

## 2014-01-08 NOTE — Progress Notes (Signed)
Patient seen and care discussed with resident team and agree with care as outlined.   Scharlene Gloss, MD

## 2014-01-08 NOTE — Progress Notes (Signed)
Internal Medicine Teaching Service Daily Progress Note  Subjective: Patient continues to report some pain in her back and shoulder but it has been controlled with tylenol. Has not had chest pressure or used SL nitro Does report increased dental pain in right superior molar region.  Objective: Vital signs in last 24 hours: Filed Vitals:   01/07/14 0608 01/07/14 1401 01/07/14 2115 01/08/14 0538  BP: 155/88 152/80 155/70 164/82  Pulse:   71 62  Temp:  98.4 F (36.9 C) 98.5 F (36.9 C) 98.5 F (36.9 C)  TempSrc:  Oral Oral Oral  Resp:  18 18 18   Height:      Weight:    320 lb 8.8 oz (145.4 kg)  SpO2:  98% 98% 100%   Weight change: -7.1 oz (-0.2 kg)  Intake/Output Summary (Last 24 hours) at 01/08/14 1415 Last data filed at 01/07/14 2300  Gross per 24 hour  Intake    120 ml  Output      0 ml  Net    120 ml   General: resting in bed, NAD HEENT: Patient has some inflammation of the gums between the superior right 2nd and 3rd molar. Cardiac: RRR, no rubs, murmurs or gallops Pulm: clear to auscultation bilaterally, moving normal volumes of air Abd: obese, soft, nontender, nondistended, BS present Ext: warm and well perfused, trace pedal edema Back: 3 small papillary bumps on the right at about the level of T5 on the right.   Lab Results: Basic Metabolic Panel:  Recent Labs Lab 01/06/14 0550 01/08/14 0352  NA 141 140  K 3.5* 3.8  CL 100 97  CO2 27 29  GLUCOSE 111* 151*  BUN 10 12  CREATININE 0.78 0.97  CALCIUM 9.5 9.7   CBC:  Recent Labs Lab 01/07/14 0530 01/08/14 0352  WBC 6.3 7.9  HGB 11.8* 11.2*  HCT 34.9* 33.4*  MCV 80.0 80.7  PLT 207 224   Cardiac Enzymes:  Recent Labs Lab 01/02/14 1256 01/02/14 1715 01/02/14 2245  TROPONINI <0.30 <0.30 <0.30   Micro Results: Recent Results (from the past 240 hour(s))  SURGICAL PCR SCREEN     Status: None   Collection Time    01/04/14  6:39 PM      Result Value Ref Range Status   MRSA, PCR NEGATIVE   NEGATIVE Final   Staphylococcus aureus NEGATIVE  NEGATIVE Final   Comment:            The Xpert SA Assay (FDA     approved for NASAL specimens     in patients over 16 years of age),     is one component of     a comprehensive surveillance     program.  Test performance has     been validated by [employer] for patients greater     than or equal to 2 year old.     It is not intended     to diagnose infection nor to     guide or monitor treatment.   Studies/Results: No results found. Medications: I have reviewed the patient's current medications. Scheduled Meds: . aspirin EC  81 mg Oral Daily  . atorvastatin  80 mg Oral q1800  . bisacodyl  5 mg Oral Once  . calcium-vitamin D  1 tablet Oral Q breakfast  . Chlorhexidine Gluconate Cloth  6 each Topical Once   And  . Chlorhexidine Gluconate Cloth  6 each Topical Once  . cycloSPORINE  1 drop  Both Eyes BID  . [START ON 01/09/2014] diazepam  5 mg Oral Once  . ferrous sulfate  325 mg Oral Q breakfast  . fluticasone  2 spray Each Nare Daily  . heparin subcutaneous  5,000 Units Subcutaneous 3 times per day  . hydrochlorothiazide  25 mg Oral Daily  . insulin aspart  0-20 Units Subcutaneous TID WC  . insulin aspart  0-5 Units Subcutaneous QHS  . irbesartan  300 mg Oral Daily  . [START ON 01/09/2014] metoprolol tartrate  12.5 mg Oral Once  . metoprolol tartrate  25 mg Oral BID  . multivitamin with minerals  1 tablet Oral Once per day on Mon Wed Fri  . pantoprazole  80 mg Oral Q1200  . pneumococcal 23 valent vaccine  0.5 mL Intramuscular Tomorrow-1000  . senna-docusate  1 tablet Oral Daily  . vitamin C  250 mg Oral Q breakfast   Continuous Infusions:   PRN Meds:.acetaminophen, albuterol, ALPRAZolam, alum & mag hydroxide-simeth, lactulose, nitroGLYCERIN, ondansetron (ZOFRAN) IV, oxyCODONE, polyethylene glycol Assessment/Plan:  Severe 3 vessel CARD involving the LAD, CFx and RCA - Cardiology following -Continue ASA -  Metoprolol -NTG SL PRN -CVTS consult  (Dr. Prescott Gum)  CABG planned for 2/18.     Dental Pain -Notified Dr. Nils Pyle - Attempted to contact Dr. Enrique Sack for dental evaluation, no answer at office (likely due to snow), left message on personal voice mail seeking consult - Obtain Orthopantogram for further evaluation- shows possible infection. - Will continue with surgery, Ordered Clindamycin IV (patient has allergy to cephalosporins- potential cross reactivity with PCN)  HYPERTENSION mildly hypertensive this AM - HCTZ 25 daily - Irbesartan 300mg  daily - Metoprolol 25 bid  Right back and shoulder pain - Continue Tylenol, Oxycodone if needed  Constipation-resolved - Continue Sennokot-S - Miralax daily PRN  HLD - Continue Atorvastatin  Asthma No acute issues Albuterol PRN  Normocytic anemia - No active bleeding workup as outpatient.  DVT PPx: Heparin Lacey Diet: Carb mod Dispo: Disposition is deferred at this time, awaiting improvement of current medical problems. Given patient's angina at risk not safe for discharge. Will need to keep while awaiting CABG.  The patient does have a current PCP (Madilyn Fireman, MD) and does need an [facility] hospital follow-up appointment after discharge.  The patient does not have transportation limitations that hinder transportation to clinic appointments.  .Services Needed at time of discharge: Y = Yes, Blank = No PT:   OT:   RN:   Equipment:   Other:     LOS: 7 days   Joni Reining, DO 01/08/2014, 2:15 PM

## 2014-01-08 NOTE — Progress Notes (Signed)
4 Days Post-Op Procedure(s) (LRB): LEFT HEART CATHETERIZATION WITH CORONARY ANGIOGRAM (N/A) Subjective: Severe 3v CAD- having angina at rest C/o molar tooth pain, panorex indicates prob root infection Rash on back better Because of severe CAD with ongoing angina the best plan of care is to proceed with CABG then deal with dental issues later, cover with anaerobic coverage for now  Objective: Vital signs in last 24 hours: Temp:  [98.5 F (36.9 C)] 98.5 F (36.9 C) (02/17 0538) Pulse Rate:  [62-71] 62 (02/17 0538) Cardiac Rhythm:  [-] Normal sinus rhythm (02/17 0720) Resp:  [18] 18 (02/17 0538) BP: (155-164)/(70-82) 164/82 mmHg (02/17 0538) SpO2:  [98 %-100 %] 100 % (02/17 0538) Weight:  [320 lb 8.8 oz (145.4 kg)] 320 lb 8.8 oz (145.4 kg) (02/17 0538)  Hemodynamic parameters for last 24 hours:  stable  Intake/Output from previous day: 02/16 0701 - 02/17 0700 In: 360 [P.O.:360] Out: -  Intake/Output this shift:    EXAM Lungs clear extrem warm Neuro intact  Lab Results:  Recent Labs  01/07/14 0530 01/08/14 0352  WBC 6.3 7.9  HGB 11.8* 11.2*  HCT 34.9* 33.4*  PLT 207 224   BMET:  Recent Labs  01/06/14 0550 01/08/14 0352  NA 141 140  K 3.5* 3.8  CL 100 97  CO2 27 29  GLUCOSE 111* 151*  BUN 10 12  CREATININE 0.78 0.97  CALCIUM 9.5 9.7    PT/INR: No results found for this basename: LABPROT, INR,  in the last 72 hours ABG    Component Value Date/Time   PHART 7.428 01/05/2014 0424   HCO3 27.3* 01/05/2014 0424   TCO2 28.6 01/05/2014 0424   O2SAT 93.9 01/05/2014 0424   CBG (last 3)   Recent Labs  01/07/14 2119 01/08/14 0614 01/08/14 1157  GLUCAP 111* 109* 98    Assessment/Plan: S/P Procedure(s) (LRB): LEFT HEART CATHETERIZATION WITH CORONARY ANGIOGRAM (N/A) CABG in am Procedure d/w patient   LOS: 7 days    VAN TRIGT III,[name] 01/08/2014

## 2014-01-08 NOTE — Progress Notes (Signed)
Patient Name: [name] Date of Encounter: 01/08/2014     Principal Problem:   Coronary atherosclerosis of native coronary artery Active Problems:   SLEEP APNEA, OBSTRUCTIVE, MILD   HYPERTENSION   Anginal chest pain at rest   Right shoulder pain   Positive cardiac stress test   Intermediate coronary syndrome    SUBJECTIVE  No CP or SOB. She complains of severe pain in right upper shoulder area.  Imprvoed since yeaterday.  She has a tooth ache in the upper right portion of hier mouth.  No drainage from the oral site.  CURRENT MEDS . aspirin EC  81 mg Oral Daily  . atorvastatin  80 mg Oral q1800  . bisacodyl  5 mg Oral Once  . calcium-vitamin D  1 tablet Oral Q breakfast  . Chlorhexidine Gluconate Cloth  6 each Topical Once   And  . Chlorhexidine Gluconate Cloth  6 each Topical Once  . cycloSPORINE  1 drop Both Eyes BID  . [START ON 01/09/2014] diazepam  5 mg Oral Once  . ferrous sulfate  325 mg Oral Q breakfast  . fluticasone  2 spray Each Nare Daily  . heparin subcutaneous  5,000 Units Subcutaneous 3 times per day  . hydrochlorothiazide  25 mg Oral Daily  . insulin aspart  0-20 Units Subcutaneous TID WC  . insulin aspart  0-5 Units Subcutaneous QHS  . irbesartan  300 mg Oral Daily  . [START ON 01/09/2014] metoprolol tartrate  12.5 mg Oral Once  . metoprolol tartrate  25 mg Oral BID  . multivitamin with minerals  1 tablet Oral Once per day on Mon Wed Fri  . pantoprazole  80 mg Oral Q1200  . pneumococcal 23 valent vaccine  0.5 mL Intramuscular Tomorrow-1000  . senna-docusate  1 tablet Oral Daily  . vitamin C  250 mg Oral Q breakfast    OBJECTIVE  Filed Vitals:   01/07/14 0608 01/07/14 1401 01/07/14 2115 01/08/14 0538  BP: 155/88 152/80 155/70 164/82  Pulse:   71 62  Temp:  98.4 F (36.9 C) 98.5 F (36.9 C) 98.5 F (36.9 C)  TempSrc:  Oral Oral Oral  Resp:  18 18 18   Height:      Weight:    320 lb 8.8 oz (145.4 kg)  SpO2:  98% 98% 100%     Intake/Output Summary (Last 24 hours) at 01/08/14 1338 Last data filed at 01/07/14 2300  Gross per 24 hour  Intake    240 ml  Output      0 ml  Net    240 ml   Filed Weights   01/04/14 1900 01/07/14 0442 01/08/14 0538  Weight: 332 lb 10.8 oz (150.9 kg) 320 lb 15.8 oz (145.6 kg) 320 lb 8.8 oz (145.4 kg)    PHYSICAL EXAM  General: Pleasant, NAD. Obese  Neuro: Alert and oriented X 3. Moves all extremities spontaneously. Psych: Normal affect. HEENT:  Normal  Neck: Supple without bruits or JVD. Lungs:  Resp regular and unlabored, CTA. Heart: RRR no s3, s4, or murmurs. Abdomen: Soft, non-tender, non-distended, BS + x 4.  Extremities: No clubbing, cyanosis or edema. DP/PT/Radials 2+ and equal bilaterally. BACK- Right posterior shoulder with flesh colored lesions, severe pain to touch  Accessory Clinical Findings  CBC  Recent Labs  01/07/14 0530 01/08/14 0352  WBC 6.3 7.9  HGB 11.8* 11.2*  HCT 34.9* 33.4*  MCV 80.0 80.7  PLT 207 [id number]   Basic Metabolic Panel  Recent  Labs  01/06/14 0550 01/08/14 0352  NA 141 140  K 3.5* 3.8  CL 100 97  CO2 27 29  GLUCOSE 111* 151*  BUN 10 12  CREATININE 0.78 0.97  CALCIUM 9.5 9.7    TELE  NSR  ECG  TWI in inferolat leads  Radiology/Studies Left Heart Catheterization with Coronary Angiography Report  [name]  58 y.o.  female  01-09-1956  Procedure Date: 01/04/2014  Referring Physician: C. Tommy Medal, MD  Primary Cardiologist: Gwenlyn Found, MD  INDICATIONS: Class 3 angina and abnormal myocardial perfusion study  PROCEDURE: 1. Left heart cath; 2. Coronary angiography; 3. Left ventriculography  CONSENT:  The risks, benefits, and details of the procedure were explained in detail to the patient. Risks including death, stroke, heart attack, kidney injury, allergy, limb ischemia, bleeding and radiation injury were discussed. The patient verbalized understanding and wanted to proceed. Informed written consent was obtained.   PROCEDURE TECHNIQUE: After Xylocaine anesthesia a 67F Slender sheath was placed in the right radial artery with a single double wall needle stick. Coronary angiography was done using a 67F JR4 and JL3.5 catheters. Left ventriculography was done using the JR4 catheter and hand injection.  Hemostasis achieved with the wrist band without complications  MEDICATIONS: Versed 1 mg IV and Fentanyl 50 micrograms IV  CONTRAST: Total of 90 cc.  COMPLICATIONS: none  HEMODYNAMICS: Aortic pressure 186/93 mmHg; LV pressure 190/21 mmHg; LVEDP 27 mmHg  ANGIOGRAPHIC DATA: The left main coronary artery is widely patent.  The left anterior descending artery is is moderately calcified proximally. There is severe proximal to mid 90% stenosis. The LAD is transapical.  The left circumflex artery is highly diseased. There is 80-90% stenosis of two large marginal branches and high grade mid circumflex stenosis.  The right coronary artery is dominant and contains 95% stenosis distally before the origin of the PDA.  LEFT VENTRICULOGRAM: Left ventricular angiogram was done in the 30 RAO projection and revealed normal contractility with EF 60%.  IMPRESSIONS: 1. Severe 3 vessel CAD involving the LAD, Cfx, and RCA  2. Preserved LV systolic function.  RECOMMENDATION: Consider CABG. Her weight may be a problem with bypass. If so, RCA and LAD could be stented. The circumflex would be more difficult to treat percutaneously..   Dg Chest 2 View  01/05/2014   CLINICAL DATA:  Coronary artery disease. Preoperative respiratory exam.  EXAM: CHEST - 2 VIEW  COMPARISON:  DG CHEST 2 VIEW dated 01/01/2014; DG CHEST 2 VIEW dated 06/11/2012  FINDINGS: The heart size and mediastinal contours are within normal limits. There is no evidence of pulmonary edema, consolidation, pneumothorax, nodule or pleural fluid. Stable degenerative changes are present in the thoracic spine.  IMPRESSION: No active disease.    Nm Myocar Multi W/spect W/wall Motion /  Ef  01/03/2014   CLINICAL DATA:  58 year old female with chest pain with  EXAM: MYOCARDIAL IMAGING WITH SPECT (REST AND PHARMACOLOGIC-STRESS - 2 DAY PROTOCOL)  GATED LEFT VENTRICULAR WALL MOTION STUDY  LEFT VENTRICULAR EJECTION FRACTION  TECHNIQUE: Standard myocardial SPECT imaging was performed after resting intravenous injection of 30 mCi Tc-42m sestamibi. Subsequently, on a second day, intravenous infusion of Lexiscan was performed under the supervision of the Cardiology staff. At peak effect of the drug, 30 mCi Tc-44m sestamibi was injected intravenously and standard myocardial SPECT imaging was performed. Quantitative gated imaging was also performed to evaluate left ventricular wall motion, and estimate left ventricular ejection fraction.  COMPARISON:  None.  FINDINGS: Perfusion: There is  some motion degradation on this 2 day study. 5+ breast size additionally.  There are decreased counts within the inferior lateral wall and lateral wall involving the mid and basilar segments which improved from stress to rest. This is concerning for ischemia of the lateral wall.  There is apparent fixed defect within the anterior septal wall apex with mild reversibility which may be due to motion.  Wall Motion: Motion artifact makes evaluation wall motion difficult.  Ejection Fraction: 16% %  End-diastolic volume equals: 109 mL Ml  End systolic volume equals: 53 mL Ml  IMPRESSION:  1. Concern for ischemia of the mid and basilar segments of the inferolateral and lateral wall. 2. Fixed defect with mild reversibility within the anterior septal wall versus motion artifact. 3. Wall motion is difficult to assess due to motion artifact. 4. Left ventricular ejection fraction equals 52% These results will be called to the ordering clinician or representative by the Radiologist Assistant, and communication documented in the PACS Dashboard.      ASSESSMENT AND PLAN The patient is a 58 yo morbidly obese female with a history of  difficult to control HTN, HLD, DM, GERD, OSA, and uterine bleeding who presented with "squeezing" chest pain on 01/01/14. She had LHC showing severe three-vessel coronary disease and good LV function without significant valvular disease. Vein mapping shows adequate vein in the right leg, no evidence of DVT. She is awaiting CABG scheduled for tomorrow.  Severe 3 vessel CAD involving the LAD, CFx and RCA  -Continue ASA  - Metoprolol  -NTG SL PRN  -CVTS conducting pre-op eval. CABG tomorrow. One episode of recurrent CP post cath.  HYPERTENSION - still high.  - HCTZ 25 daily  - Irbesartan 300mg  daily  - Metoprolol 25 bid   Hypokalemia  -- BMET today pending  Obesity  Weight loss is essential to her short and long term well being  HLD  - Continue Atorvastatin    Signed,   Isma Tietje S.

## 2014-01-09 ENCOUNTER — Encounter (HOSPITAL_COMMUNITY): Payer: Self-pay | Admitting: Critical Care Medicine

## 2014-01-09 ENCOUNTER — Inpatient Hospital Stay (HOSPITAL_COMMUNITY): Payer: Medicare Other

## 2014-01-09 ENCOUNTER — Inpatient Hospital Stay (HOSPITAL_COMMUNITY): Payer: Medicare Other | Admitting: Critical Care Medicine

## 2014-01-09 ENCOUNTER — Encounter (HOSPITAL_COMMUNITY): Payer: Medicare Other | Admitting: Critical Care Medicine

## 2014-01-09 ENCOUNTER — Encounter (HOSPITAL_COMMUNITY)
Admission: EM | Disposition: A | Discharge status: Skilled Nursing Facility | Payer: Medicare Other | Source: Home / Self Care | Attending: Cardiothoracic Surgery

## 2014-01-09 ENCOUNTER — Encounter (HOSPITAL_COMMUNITY): Payer: Medicare Other

## 2014-01-09 DIAGNOSIS — I2510 Atherosclerotic heart disease of native coronary artery without angina pectoris: Secondary | ICD-10-CM

## 2014-01-09 DIAGNOSIS — Z951 Presence of aortocoronary bypass graft: Secondary | ICD-10-CM

## 2014-01-09 HISTORY — PX: INTRAOPERATIVE TRANSESOPHAGEAL ECHOCARDIOGRAM: SHX5062

## 2014-01-09 HISTORY — PX: CORONARY ARTERY BYPASS GRAFT: SHX141

## 2014-01-09 LAB — CBC
HCT: 31.2 % — ABNORMAL LOW (ref 36.0–46.0)
HEMATOCRIT: 34.1 % — AB (ref 36.0–46.0)
HEMATOCRIT: 33.9 % — AB (ref 36.0–46.0)
HEMOGLOBIN: 11.3 g/dL — AB (ref 12.0–15.0)
Hemoglobin: 11.4 g/dL — ABNORMAL LOW (ref 12.0–15.0)
Hemoglobin: 10.5 g/dL — ABNORMAL LOW (ref 12.0–15.0)
MCH: 26.6 pg (ref 26.0–34.0)
MCH: 26.8 pg (ref 26.0–34.0)
MCH: 26.9 pg (ref 26.0–34.0)
MCHC: 33.1 g/dL (ref 30.0–36.0)
MCHC: 33.6 g/dL (ref 30.0–36.0)
MCHC: 33.7 g/dL (ref 30.0–36.0)
MCV: 80.2 fL (ref 78.0–100.0)
MCV: 79.8 fL (ref 78.0–100.0)
MCV: 80 fL (ref 78.0–100.0)
PLATELETS: 231 10*3/uL (ref 150–400)
Platelets: 175 10*3/uL (ref 150–400)
Platelets: 174 10*3/uL (ref 150–400)
RBC: 4.25 MIL/uL (ref 3.87–5.11)
RBC: 4.25 MIL/uL (ref 3.87–5.11)
RBC: 3.9 MIL/uL (ref 3.87–5.11)
RDW: 16.5 % — ABNORMAL HIGH (ref 11.5–15.5)
RDW: 16.4 % — AB (ref 11.5–15.5)
RDW: 16.5 % — ABNORMAL HIGH (ref 11.5–15.5)
WBC: 7.3 10*3/uL (ref 4.0–10.5)
WBC: 17.7 10*3/uL — ABNORMAL HIGH (ref 4.0–10.5)
WBC: 15.9 10*3/uL — ABNORMAL HIGH (ref 4.0–10.5)

## 2014-01-09 LAB — POCT I-STAT 4, (NA,K, GLUC, HGB,HCT)
GLUCOSE: 112 mg/dL — AB (ref 70–99)
GLUCOSE: 123 mg/dL — AB (ref 70–99)
GLUCOSE: 151 mg/dL — AB (ref 70–99)
Glucose, Bld: 137 mg/dL — ABNORMAL HIGH (ref 70–99)
Glucose, Bld: 130 mg/dL — ABNORMAL HIGH (ref 70–99)
Glucose, Bld: 162 mg/dL — ABNORMAL HIGH (ref 70–99)
HCT: 36 % (ref 36.0–46.0)
HCT: 32 % — ABNORMAL LOW (ref 36.0–46.0)
HCT: 26 % — ABNORMAL LOW (ref 36.0–46.0)
HEMATOCRIT: 26 % — AB (ref 36.0–46.0)
HEMATOCRIT: 28 % — AB (ref 36.0–46.0)
HEMATOCRIT: 37 % (ref 36.0–46.0)
HEMOGLOBIN: 12.2 g/dL (ref 12.0–15.0)
HEMOGLOBIN: 9.5 g/dL — AB (ref 12.0–15.0)
HEMOGLOBIN: 12.6 g/dL (ref 12.0–15.0)
Hemoglobin: 10.9 g/dL — ABNORMAL LOW (ref 12.0–15.0)
Hemoglobin: 8.8 g/dL — ABNORMAL LOW (ref 12.0–15.0)
Hemoglobin: 8.8 g/dL — ABNORMAL LOW (ref 12.0–15.0)
POTASSIUM: 3.9 meq/L (ref 3.7–5.3)
POTASSIUM: 3.4 meq/L — AB (ref 3.7–5.3)
POTASSIUM: 4.2 meq/L (ref 3.7–5.3)
Potassium: 3.9 mEq/L (ref 3.7–5.3)
Potassium: 4.2 mEq/L (ref 3.7–5.3)
Potassium: 4 mEq/L (ref 3.7–5.3)
SODIUM: 136 meq/L — AB (ref 137–147)
SODIUM: 140 meq/L (ref 137–147)
Sodium: 137 mEq/L (ref 137–147)
Sodium: 135 mEq/L — ABNORMAL LOW (ref 137–147)
Sodium: 136 mEq/L — ABNORMAL LOW (ref 137–147)
Sodium: 135 mEq/L — ABNORMAL LOW (ref 137–147)

## 2014-01-09 LAB — POCT I-STAT 3, ART BLOOD GAS (G3+)
Acid-Base Excess: 3 mmol/L — ABNORMAL HIGH (ref 0.0–2.0)
Acid-Base Excess: 1 mmol/L (ref 0.0–2.0)
BICARBONATE: 25.5 meq/L — AB (ref 20.0–24.0)
Bicarbonate: 28.7 mEq/L — ABNORMAL HIGH (ref 20.0–24.0)
Bicarbonate: 26.8 mEq/L — ABNORMAL HIGH (ref 20.0–24.0)
O2 Saturation: 100 %
O2 Saturation: 99 %
O2 Saturation: 95 %
PCO2 ART: 46.1 mmHg — AB (ref 35.0–45.0)
PH ART: 7.393 (ref 7.350–7.450)
PO2 ART: 76 mmHg — AB (ref 80.0–100.0)
TCO2: 30 mmol/L (ref 0–100)
TCO2: 28 mmol/L (ref 0–100)
TCO2: 27 mmol/L (ref 0–100)
pCO2 arterial: 47.2 mmHg — ABNORMAL HIGH (ref 35.0–45.0)
pCO2 arterial: 43.9 mmHg (ref 35.0–45.0)
pH, Arterial: 7.372 (ref 7.350–7.450)
pH, Arterial: 7.368 (ref 7.350–7.450)
pO2, Arterial: 281 mmHg — ABNORMAL HIGH (ref 80.0–100.0)
pO2, Arterial: 143 mmHg — ABNORMAL HIGH (ref 80.0–100.0)

## 2014-01-09 LAB — POCT I-STAT GLUCOSE
GLUCOSE: 120 mg/dL — AB (ref 70–99)
Glucose, Bld: 130 mg/dL — ABNORMAL HIGH (ref 70–99)
OPERATOR ID: 286891
OPERATOR ID: 3406

## 2014-01-09 LAB — CREATININE, SERUM
Creatinine, Ser: 0.74 mg/dL (ref 0.50–1.10)
GFR calc Af Amer: >90 mL/min (ref >90)
GFR calc non Af Amer: >90 mL/min (ref >90)

## 2014-01-09 LAB — GLUCOSE, CAPILLARY
GLUCOSE-CAPILLARY: 110 mg/dL — AB (ref 70–99)
GLUCOSE-CAPILLARY: 118 mg/dL — AB (ref 70–99)
GLUCOSE-CAPILLARY: 151 mg/dL — AB (ref 70–99)
GLUCOSE-CAPILLARY: 181 mg/dL — AB (ref 70–99)
Glucose-Capillary: 138 mg/dL — ABNORMAL HIGH (ref 70–99)
Glucose-Capillary: 135 mg/dL — ABNORMAL HIGH (ref 70–99)
Glucose-Capillary: 82 mg/dL (ref 70–99)
Glucose-Capillary: 183 mg/dL — ABNORMAL HIGH (ref 70–99)

## 2014-01-09 LAB — POCT I-STAT, CHEM 8
BUN: 9 mg/dL (ref 6–23)
CALCIUM ION: 1.2 mmol/L (ref 1.12–1.23)
Chloride: 104 mEq/L (ref 96–112)
Creatinine, Ser: 0.9 mg/dL (ref 0.50–1.10)
Glucose, Bld: 195 mg/dL — ABNORMAL HIGH (ref 70–99)
HEMATOCRIT: 33 % — AB (ref 36.0–46.0)
Hemoglobin: 11.2 g/dL — ABNORMAL LOW (ref 12.0–15.0)
Potassium: 4.2 mEq/L (ref 3.7–5.3)
Sodium: 138 mEq/L (ref 137–147)
TCO2: 23 mmol/L (ref 0–100)

## 2014-01-09 LAB — MAGNESIUM: Magnesium: 3.3 mg/dL — ABNORMAL HIGH (ref 1.5–2.5)

## 2014-01-09 LAB — PROTIME-INR
INR: 1.1 (ref 0.00–1.49)
Prothrombin Time: 14 seconds (ref 11.6–15.2)

## 2014-01-09 LAB — HEMOGLOBIN AND HEMATOCRIT, BLOOD
HCT: 22.9 % — ABNORMAL LOW (ref 36.0–46.0)
Hemoglobin: 7.8 g/dL — ABNORMAL LOW (ref 12.0–15.0)

## 2014-01-09 LAB — PLATELET COUNT: Platelets: 140 10*3/uL — ABNORMAL LOW (ref 150–400)

## 2014-01-09 LAB — PREPARE RBC (CROSSMATCH)

## 2014-01-09 LAB — APTT: aPTT: 28 seconds (ref 24–37)

## 2014-01-09 SURGERY — CORONARY ARTERY BYPASS GRAFTING (CABG)
Anesthesia: General | Site: Chest

## 2014-01-09 MED ORDER — DOPAMINE-DEXTROSE 3.2-5 MG/ML-% IV SOLN
2.0000 ug/kg/min | INTRAVENOUS | Status: DC
  Administered 2014-01-10 – 2014-01-12 (×3): 4 ug/kg/min via INTRAVENOUS
  Filled 2014-01-09 (×3): qty 250

## 2014-01-09 MED ORDER — VANCOMYCIN HCL IN DEXTROSE 1-5 GM/200ML-% IV SOLN
1000.0000 mg | Freq: Once | INTRAVENOUS | Status: AC
  Administered 2014-01-09: 1000 mg via INTRAVENOUS
  Filled 2014-01-09: qty 200

## 2014-01-09 MED ORDER — ACETAMINOPHEN 650 MG RE SUPP
650.0000 mg | Freq: Once | RECTAL | Status: AC
  Administered 2014-01-09: 650 mg via RECTAL

## 2014-01-09 MED ORDER — MIDAZOLAM HCL 5 MG/5ML IJ SOLN
INTRAMUSCULAR | Status: DC | PRN
  Administered 2014-01-09: 3 mg via INTRAVENOUS
  Administered 2014-01-09: 1 mg via INTRAVENOUS
  Administered 2014-01-09 (×2): 2 mg via INTRAVENOUS
  Administered 2014-01-09 (×2): 5 mg via INTRAVENOUS
  Administered 2014-01-09: 2 mg via INTRAVENOUS

## 2014-01-09 MED ORDER — SODIUM CHLORIDE 0.9 % IJ SOLN: 3.0000 mL | INTRAMUSCULAR | Status: DC | PRN

## 2014-01-09 MED ORDER — PHENYLEPHRINE HCL 10 MG/ML IJ SOLN
30.0000 ug/min | Freq: Once | INTRAVENOUS | Status: DC
  Filled 2014-01-09: qty 2

## 2014-01-09 MED ORDER — FENTANYL CITRATE 0.05 MG/ML IJ SOLN
INTRAMUSCULAR | Status: DC | PRN
  Administered 2014-01-09: 100 ug via INTRAVENOUS
  Administered 2014-01-09: 150 ug via INTRAVENOUS
  Administered 2014-01-09: 250 ug via INTRAVENOUS
  Administered 2014-01-09: 150 ug via INTRAVENOUS
  Administered 2014-01-09: 100 ug via INTRAVENOUS
  Administered 2014-01-09 (×2): 250 ug via INTRAVENOUS
  Administered 2014-01-09: 100 ug via INTRAVENOUS
  Administered 2014-01-09 (×2): 150 ug via INTRAVENOUS
  Administered 2014-01-09: 100 ug via INTRAVENOUS

## 2014-01-09 MED ORDER — LACTATED RINGERS IV SOLN
INTRAVENOUS | Status: DC | PRN
Start: 2014-01-09 — End: 2014-01-09
  Administered 2014-01-09: 08:00:00 via INTRAVENOUS

## 2014-01-09 MED ORDER — SODIUM CHLORIDE 0.9 % IV SOLN
INTRAVENOUS | Status: DC
  Filled 2014-01-09: qty 40

## 2014-01-09 MED ORDER — HEPARIN SODIUM (PORCINE) 1000 UNIT/ML IJ SOLN
INTRAMUSCULAR | Status: DC | PRN
  Administered 2014-01-09: 2000 [IU] via INTRAVENOUS
  Administered 2014-01-09: 33000 [IU] via INTRAVENOUS
  Administered 2014-01-09: 3000 [IU] via INTRAVENOUS

## 2014-01-09 MED ORDER — LIDOCAINE HCL (CARDIAC) 20 MG/ML IV SOLN
INTRAVENOUS | Status: AC
  Filled 2014-01-09: qty 5

## 2014-01-09 MED ORDER — ONDANSETRON HCL 4 MG/2ML IJ SOLN
4.0000 mg | Freq: Four times a day (QID) | INTRAMUSCULAR | Status: DC | PRN
  Administered 2014-01-10 – 2014-01-20 (×8): 4 mg via INTRAVENOUS
  Filled 2014-01-09 (×9): qty 2

## 2014-01-09 MED ORDER — LEVOFLOXACIN IN D5W 750 MG/150ML IV SOLN
750.0000 mg | INTRAVENOUS | Status: AC
  Administered 2014-01-10: 750 mg via INTRAVENOUS
  Filled 2014-01-09: qty 150

## 2014-01-09 MED ORDER — SODIUM CHLORIDE 0.9 % IV SOLN
INTRAVENOUS | Status: DC
  Administered 2014-01-09: 20 mL via INTRAVENOUS
  Administered 2014-01-10: 20 mL/h via INTRAVENOUS

## 2014-01-09 MED ORDER — METOPROLOL TARTRATE 1 MG/ML IV SOLN: 2.5000 mg | INTRAVENOUS | Status: DC | PRN

## 2014-01-09 MED ORDER — ACETAMINOPHEN 500 MG PO TABS
1000.0000 mg | ORAL_TABLET | Freq: Four times a day (QID) | ORAL | Status: AC
  Administered 2014-01-10 – 2014-01-14 (×16): 1000 mg via ORAL
  Filled 2014-01-09 (×18): qty 2

## 2014-01-09 MED ORDER — MORPHINE SULFATE 2 MG/ML IJ SOLN: 1.0000 mg | INTRAMUSCULAR | Status: AC | PRN

## 2014-01-09 MED ORDER — FENTANYL CITRATE 0.05 MG/ML IJ SOLN
INTRAMUSCULAR | Status: AC
  Filled 2014-01-09: qty 5

## 2014-01-09 MED ORDER — FENTANYL CITRATE 0.05 MG/ML IJ SOLN
INTRAMUSCULAR | Status: AC
Start: 2014-01-09 — End: 2014-01-09
  Filled 2014-01-09: qty 5

## 2014-01-09 MED ORDER — NITROGLYCERIN IN D5W 200-5 MCG/ML-% IV SOLN
0.0000 ug/min | INTRAVENOUS | Status: DC
  Administered 2014-01-09: 5 ug/min via INTRAVENOUS

## 2014-01-09 MED ORDER — PROTAMINE SULFATE 10 MG/ML IV SOLN
INTRAVENOUS | Status: DC | PRN
  Administered 2014-01-09: 280 mg via INTRAVENOUS

## 2014-01-09 MED ORDER — GLYCOPYRROLATE 0.2 MG/ML IJ SOLN
INTRAMUSCULAR | Status: AC
  Filled 2014-01-09: qty 1

## 2014-01-09 MED ORDER — ROCURONIUM BROMIDE 100 MG/10ML IV SOLN
INTRAVENOUS | Status: DC | PRN
  Administered 2014-01-09 (×6): 50 mg via INTRAVENOUS

## 2014-01-09 MED ORDER — SODIUM CHLORIDE 0.9 % IV SOLN
INTRAVENOUS | Status: AC
  Administered 2014-01-09: 3.6 [IU]/h via INTRAVENOUS
  Filled 2014-01-09 (×2): qty 1

## 2014-01-09 MED ORDER — DEXMEDETOMIDINE HCL IN NACL 200 MCG/50ML IV SOLN
0.1000 ug/kg/h | INTRAVENOUS | Status: DC
  Administered 2014-01-09 – 2014-01-10 (×5): 0.7 ug/kg/h via INTRAVENOUS
  Filled 2014-01-09 (×6): qty 50

## 2014-01-09 MED ORDER — BISACODYL 10 MG RE SUPP: 10.0000 mg | Freq: Every day | RECTAL | Status: DC

## 2014-01-09 MED ORDER — ARTIFICIAL TEARS OP OINT
TOPICAL_OINTMENT | OPHTHALMIC | Status: AC
  Filled 2014-01-09: qty 3.5

## 2014-01-09 MED ORDER — ACETAMINOPHEN 160 MG/5ML PO SOLN: 650.0000 mg | Freq: Once | ORAL | Status: AC

## 2014-01-09 MED ORDER — SODIUM CHLORIDE 0.9 % IV SOLN
INTRAVENOUS | Status: DC | PRN
  Administered 2014-01-09: 15:00:00 via INTRAVENOUS

## 2014-01-09 MED ORDER — ROCURONIUM BROMIDE 50 MG/5ML IV SOLN
INTRAVENOUS | Status: AC
  Filled 2014-01-09: qty 1

## 2014-01-09 MED ORDER — DEXMEDETOMIDINE HCL IN NACL 400 MCG/100ML IV SOLN
0.1000 ug/kg/h | INTRAVENOUS | Status: DC
  Filled 2014-01-09: qty 100

## 2014-01-09 MED ORDER — METOCLOPRAMIDE HCL 5 MG/ML IJ SOLN
10.0000 mg | Freq: Four times a day (QID) | INTRAMUSCULAR | Status: AC
Start: 2014-01-09 — End: 2014-01-10
  Administered 2014-01-09 – 2014-01-10 (×3): 10 mg via INTRAVENOUS
  Filled 2014-01-09 (×3): qty 2

## 2014-01-09 MED ORDER — SUCCINYLCHOLINE CHLORIDE 20 MG/ML IJ SOLN
INTRAMUSCULAR | Status: DC | PRN
  Administered 2014-01-09: 100 mg via INTRAVENOUS

## 2014-01-09 MED ORDER — SODIUM CHLORIDE 0.9 % IJ SOLN
INTRAMUSCULAR | Status: DC | PRN
  Administered 2014-01-09 (×3): via TOPICAL

## 2014-01-09 MED ORDER — ROCURONIUM BROMIDE 50 MG/5ML IV SOLN
INTRAVENOUS | Status: AC
  Filled 2014-01-09: qty 2

## 2014-01-09 MED ORDER — PANTOPRAZOLE SODIUM 40 MG PO TBEC
40.0000 mg | DELAYED_RELEASE_TABLET | Freq: Every day | ORAL | Status: DC
  Administered 2014-01-11 – 2014-01-18 (×7): 40 mg via ORAL
  Filled 2014-01-09 (×6): qty 1

## 2014-01-09 MED ORDER — ASPIRIN 81 MG PO CHEW
324.0000 mg | CHEWABLE_TABLET | Freq: Every day | ORAL | Status: DC
  Filled 2014-01-09: qty 4

## 2014-01-09 MED ORDER — FAMOTIDINE IN NACL 20-0.9 MG/50ML-% IV SOLN
20.0000 mg | Freq: Two times a day (BID) | INTRAVENOUS | Status: AC
  Administered 2014-01-09 (×2): 20 mg via INTRAVENOUS
  Filled 2014-01-09: qty 50

## 2014-01-09 MED ORDER — HEPARIN SODIUM (PORCINE) 1000 UNIT/ML IJ SOLN
INTRAMUSCULAR | Status: AC
  Filled 2014-01-09: qty 1

## 2014-01-09 MED ORDER — PHENYLEPHRINE HCL 10 MG/ML IJ SOLN
0.0000 ug/min | INTRAMUSCULAR | Status: DC
  Administered 2014-01-09: 0 ug/min via INTRAVENOUS
  Filled 2014-01-09 (×3): qty 2

## 2014-01-09 MED ORDER — LACTATED RINGERS IV SOLN
INTRAVENOUS | Status: DC | PRN
Start: 2014-01-09 — End: 2014-01-09
  Administered 2014-01-09 (×2): via INTRAVENOUS

## 2014-01-09 MED ORDER — INSULIN REGULAR BOLUS VIA INFUSION
0.0000 [IU] | Freq: Three times a day (TID) | INTRAVENOUS | Status: AC
  Filled 2014-01-09: qty 10

## 2014-01-09 MED ORDER — LACTATED RINGERS IV SOLN
INTRAVENOUS | Status: DC
  Administered 2014-01-09: 20 mL/h via INTRAVENOUS

## 2014-01-09 MED ORDER — PLASMA-LYTE 148 IV SOLN
INTRAVENOUS | Status: AC
  Administered 2014-01-09: 12:00:00
  Filled 2014-01-09 (×2): qty 2.5

## 2014-01-09 MED ORDER — SODIUM CHLORIDE 0.45 % IV SOLN
INTRAVENOUS | Status: DC
  Administered 2014-01-09: 20 mL/h via INTRAVENOUS

## 2014-01-09 MED ORDER — PROPOFOL 10 MG/ML IV BOLUS
INTRAVENOUS | Status: DC | PRN
  Administered 2014-01-09 (×2): 100 mg via INTRAVENOUS

## 2014-01-09 MED ORDER — METOPROLOL TARTRATE 25 MG/10 ML ORAL SUSPENSION
12.5000 mg | Freq: Two times a day (BID) | ORAL | Status: DC
  Filled 2014-01-09 (×5): qty 5

## 2014-01-09 MED ORDER — GLYCOPYRROLATE 0.2 MG/ML IJ SOLN
INTRAMUSCULAR | Status: DC | PRN
  Administered 2014-01-09: 0.1 mg via INTRAVENOUS

## 2014-01-09 MED ORDER — ALBUMIN HUMAN 5 % IV SOLN
INTRAVENOUS | Status: DC | PRN
  Administered 2014-01-09: 14:00:00 via INTRAVENOUS

## 2014-01-09 MED ORDER — 0.9 % SODIUM CHLORIDE (POUR BTL) OPTIME
TOPICAL | Status: DC | PRN
  Administered 2014-01-09: 6000 mL
  Administered 2014-01-09: 1000 mL

## 2014-01-09 MED ORDER — ACETAMINOPHEN 160 MG/5ML PO SOLN
1000.0000 mg | Freq: Four times a day (QID) | ORAL | Status: AC
  Administered 2014-01-09 – 2014-01-10 (×2): 1000 mg
  Filled 2014-01-09: qty 40.6

## 2014-01-09 MED ORDER — MAGNESIUM SULFATE 4000MG/100ML IJ SOLN
4.0000 g | Freq: Once | INTRAMUSCULAR | Status: AC
  Administered 2014-01-09: 4 g via INTRAVENOUS
  Filled 2014-01-09: qty 100

## 2014-01-09 MED ORDER — DOCUSATE SODIUM 100 MG PO CAPS
200.0000 mg | ORAL_CAPSULE | Freq: Every day | ORAL | Status: DC
  Administered 2014-01-10 – 2014-01-21 (×11): 200 mg via ORAL
  Filled 2014-01-09 (×11): qty 2

## 2014-01-09 MED ORDER — OXYCODONE HCL 5 MG PO TABS
5.0000 mg | ORAL_TABLET | ORAL | Status: DC | PRN
  Administered 2014-01-10 – 2014-01-11 (×4): 10 mg via ORAL
  Administered 2014-01-11: 5 mg via ORAL
  Administered 2014-01-12 – 2014-01-13 (×3): 10 mg via ORAL
  Administered 2014-01-14: 5 mg via ORAL
  Administered 2014-01-14 – 2014-01-15 (×2): 10 mg via ORAL
  Filled 2014-01-09 (×2): qty 2
  Filled 2014-01-09: qty 1
  Filled 2014-01-09: qty 2
  Filled 2014-01-09: qty 1
  Filled 2014-01-09 (×4): qty 2
  Filled 2014-01-09: qty 1
  Filled 2014-01-09 (×2): qty 2

## 2014-01-09 MED ORDER — HEMOSTATIC AGENTS (NO CHARGE) OPTIME
TOPICAL | Status: DC | PRN
  Administered 2014-01-09: 1 via TOPICAL

## 2014-01-09 MED ORDER — ARTIFICIAL TEARS OP OINT
TOPICAL_OINTMENT | OPHTHALMIC | Status: DC | PRN
  Administered 2014-01-09: 1 via OPHTHALMIC

## 2014-01-09 MED ORDER — POTASSIUM CHLORIDE 10 MEQ/50ML IV SOLN
10.0000 meq | INTRAVENOUS | Status: AC
  Administered 2014-01-09 (×2): 10 meq via INTRAVENOUS

## 2014-01-09 MED ORDER — SODIUM CHLORIDE 0.9 % IV SOLN: 250.0000 mL | INTRAVENOUS | Status: DC

## 2014-01-09 MED ORDER — CALCIUM CHLORIDE 10 % IV SOLN
INTRAVENOUS | Status: DC | PRN
  Administered 2014-01-09: 200 mg via INTRAVENOUS

## 2014-01-09 MED ORDER — MORPHINE SULFATE 2 MG/ML IJ SOLN
2.0000 mg | INTRAMUSCULAR | Status: DC | PRN
  Administered 2014-01-10 (×4): 2 mg via INTRAVENOUS
  Administered 2014-01-10: 4 mg via INTRAVENOUS
  Administered 2014-01-10 – 2014-01-11 (×2): 2 mg via INTRAVENOUS
  Filled 2014-01-09 (×4): qty 1
  Filled 2014-01-09: qty 2
  Filled 2014-01-09 (×2): qty 1

## 2014-01-09 MED ORDER — DEXTROSE 5 % IV SOLN
10.0000 mg | INTRAVENOUS | Status: DC | PRN
  Administered 2014-01-09: 15 ug/min via INTRAVENOUS

## 2014-01-09 MED ORDER — FUROSEMIDE 10 MG/ML IJ SOLN
20.0000 mg | Freq: Once | INTRAMUSCULAR | Status: AC
  Administered 2014-01-10: 20 mg via INTRAVENOUS

## 2014-01-09 MED ORDER — MIDAZOLAM HCL 2 MG/2ML IJ SOLN
2.0000 mg | INTRAMUSCULAR | Status: DC | PRN
  Administered 2014-01-10: 2 mg via INTRAVENOUS
  Filled 2014-01-09: qty 2

## 2014-01-09 MED ORDER — MIDAZOLAM HCL 10 MG/2ML IJ SOLN
INTRAMUSCULAR | Status: AC
  Filled 2014-01-09: qty 2

## 2014-01-09 MED ORDER — SODIUM CHLORIDE 0.9 % IJ SOLN
INTRAMUSCULAR | Status: AC
  Filled 2014-01-09: qty 10

## 2014-01-09 MED ORDER — BISACODYL 5 MG PO TBEC
10.0000 mg | DELAYED_RELEASE_TABLET | Freq: Every day | ORAL | Status: DC
  Administered 2014-01-10 – 2014-01-21 (×7): 10 mg via ORAL
  Filled 2014-01-09 (×5): qty 2

## 2014-01-09 MED ORDER — ASPIRIN EC 325 MG PO TBEC
325.0000 mg | DELAYED_RELEASE_TABLET | Freq: Every day | ORAL | Status: DC
  Administered 2014-01-10 – 2014-01-21 (×11): 325 mg via ORAL
  Filled 2014-01-09 (×12): qty 1

## 2014-01-09 MED ORDER — SODIUM CHLORIDE 0.9 % IJ SOLN
3.0000 mL | Freq: Two times a day (BID) | INTRAMUSCULAR | Status: DC
  Administered 2014-01-10 – 2014-01-13 (×7): 3 mL via INTRAVENOUS

## 2014-01-09 MED ORDER — METOPROLOL TARTRATE 12.5 MG HALF TABLET
12.5000 mg | ORAL_TABLET | Freq: Two times a day (BID) | ORAL | Status: DC
  Administered 2014-01-10: 12.5 mg via ORAL
  Filled 2014-01-09 (×5): qty 1

## 2014-01-09 MED ORDER — POTASSIUM CHLORIDE 10 MEQ/50ML IV SOLN: 10.0000 meq | INTRAVENOUS | Status: DC

## 2014-01-09 MED ORDER — LACTATED RINGERS IV SOLN: 500.0000 mL | Freq: Once | INTRAVENOUS | Status: AC | PRN

## 2014-01-09 MED ORDER — ALBUMIN HUMAN 5 % IV SOLN
250.0000 mL | INTRAVENOUS | Status: AC | PRN
  Administered 2014-01-10: 250 mL via INTRAVENOUS

## 2014-01-09 MED ORDER — MILRINONE IN DEXTROSE 20 MG/100ML IV SOLN
0.1250 ug/kg/min | INTRAVENOUS | Status: DC
  Administered 2014-01-09 – 2014-01-10 (×2): 0.125 ug/kg/min via INTRAVENOUS
  Filled 2014-01-09 (×2): qty 100

## 2014-01-09 MED ORDER — LIDOCAINE HCL (CARDIAC) 20 MG/ML IV SOLN
INTRAVENOUS | Status: DC | PRN
  Administered 2014-01-09: 100 mg via INTRAVENOUS

## 2014-01-09 MED ORDER — PROPOFOL 10 MG/ML IV BOLUS
INTRAVENOUS | Status: AC
  Filled 2014-01-09: qty 20

## 2014-01-09 MED ORDER — LACTATED RINGERS IV SOLN
INTRAVENOUS | Status: DC | PRN
  Administered 2014-01-09: 08:00:00 via INTRAVENOUS

## 2014-01-09 SURGICAL SUPPLY — 113 items
ADAPTER CARDIO PERF ANTE/RETRO (ADAPTER) ×3 IMPLANT
ATTRACTOMAT 16X20 MAGNETIC DRP (DRAPES) ×3 IMPLANT
BAG DECANTER FOR FLEXI CONT (MISCELLANEOUS) ×6 IMPLANT
BANDAGE ELASTIC 4 VELCRO ST LF (GAUZE/BANDAGES/DRESSINGS) ×3 IMPLANT
BANDAGE ELASTIC 6 VELCRO ST LF (GAUZE/BANDAGES/DRESSINGS) ×3 IMPLANT
BANDAGE GAUZE ELAST BULKY 4 IN (GAUZE/BANDAGES/DRESSINGS) ×3 IMPLANT
BASKET HEART  (ORDER IN 25'S) (MISCELLANEOUS) ×1
BASKET HEART (ORDER IN 25'S) (MISCELLANEOUS) ×1
BASKET HEART (ORDER IN 25S) (MISCELLANEOUS) ×1 IMPLANT
BENZOIN TINCTURE PRP APPL 2/3 (GAUZE/BANDAGES/DRESSINGS) ×3 IMPLANT
BINDER ABDOMINAL 12 ML 46-62 (SOFTGOODS) ×3 IMPLANT
BLADE STERNUM SYSTEM 6 (BLADE) ×3 IMPLANT
BLADE SURG 11 STRL SS (BLADE) ×3 IMPLANT
BLADE SURG 12 STRL SS (BLADE) ×3 IMPLANT
BLADE SURG ROTATE 9660 (MISCELLANEOUS) IMPLANT
BNDG GAUZE ELAST 4 BULKY (GAUZE/BANDAGES/DRESSINGS) ×3 IMPLANT
CANISTER SUCTION 2500CC (MISCELLANEOUS) ×3 IMPLANT
CANNULA ARTERIAL NVNT 3/8 22FR (MISCELLANEOUS) ×3 IMPLANT
CANNULA GUNDRY RCSP 15FR (MISCELLANEOUS) ×3 IMPLANT
CANNULA VENOUS LOW PROF 32X40 (CANNULA) IMPLANT
CARDIAC SUCTION (MISCELLANEOUS) ×3 IMPLANT
CATH CPB KIT VANTRIGT (MISCELLANEOUS) ×3 IMPLANT
CATH FOLEY LATEX FREE 16FR (CATHETERS) ×2
CATH FOLEY LF 16FR (CATHETERS) ×1 IMPLANT
CATH ROBINSON RED A/P 18FR (CATHETERS) ×9 IMPLANT
CATH THORACIC 36FR RT ANG (CATHETERS) ×6 IMPLANT
CLIP FOGARTY SPRING 6M (CLIP) ×6 IMPLANT
CLIP TI WIDE RED SMALL 24 (CLIP) ×3 IMPLANT
CLOSURE WOUND 1/2 X4 (GAUZE/BANDAGES/DRESSINGS) ×1
COVER SURGICAL LIGHT HANDLE (MISCELLANEOUS) ×3 IMPLANT
CRADLE DONUT ADULT HEAD (MISCELLANEOUS) ×3 IMPLANT
DERMABOND ADHESIVE PROPEN (GAUZE/BANDAGES/DRESSINGS) ×2
DERMABOND ADVANCED (GAUZE/BANDAGES/DRESSINGS) ×2
DERMABOND ADVANCED .7 DNX12 (GAUZE/BANDAGES/DRESSINGS) ×1 IMPLANT
DERMABOND ADVANCED .7 DNX6 (GAUZE/BANDAGES/DRESSINGS) ×1 IMPLANT
DRAIN CHANNEL 32F RND 10.7 FF (WOUND CARE) ×3 IMPLANT
DRAPE CARDIOVASCULAR INCISE (DRAPES) ×4
DRAPE SLUSH/WARMER DISC (DRAPES) ×3 IMPLANT
DRAPE SRG 135X102X78XABS (DRAPES) ×2 IMPLANT
DRSG AQUACEL AG ADV 3.5X14 (GAUZE/BANDAGES/DRESSINGS) ×3 IMPLANT
ELECT BLADE 4.0 EZ CLEAN MEGAD (MISCELLANEOUS) ×3
ELECT BLADE 6.5 EXT (BLADE) ×3 IMPLANT
ELECT CAUTERY BLADE 6.4 (BLADE) ×3 IMPLANT
ELECT REM PT RETURN 9FT ADLT (ELECTROSURGICAL) ×6
ELECTRODE BLDE 4.0 EZ CLN MEGD (MISCELLANEOUS) ×1 IMPLANT
ELECTRODE REM PT RTRN 9FT ADLT (ELECTROSURGICAL) ×2 IMPLANT
GLOVE BIO SURGEON STRL SZ7.5 (GLOVE) ×9 IMPLANT
GLOVE SURG SS PI 6.0 STRL IVOR (GLOVE) ×21 IMPLANT
GLOVE SURG SS PI 6.5 STRL IVOR (GLOVE) ×15 IMPLANT
GLOVE SURG SS PI 7.0 STRL IVOR (GLOVE) ×9 IMPLANT
GOWN STRL NON-REIN LRG LVL3 (GOWN DISPOSABLE) ×27 IMPLANT
HEMOSTAT POWDER SURGIFOAM 1G (HEMOSTASIS) ×9 IMPLANT
HEMOSTAT SURGICEL 2X14 (HEMOSTASIS) ×3 IMPLANT
INSERT FOGARTY XLG (MISCELLANEOUS) IMPLANT
KIT BASIN OR (CUSTOM PROCEDURE TRAY) ×3 IMPLANT
KIT ROOM TURNOVER OR (KITS) ×3 IMPLANT
KIT SUCTION CATH 14FR (SUCTIONS) ×3 IMPLANT
KIT VASOVIEW W/TROCAR VH 2000 (KITS) ×3 IMPLANT
LEAD PACING MYOCARDI (MISCELLANEOUS) ×3 IMPLANT
LINE VENT (MISCELLANEOUS) ×3 IMPLANT
MARKER GRAFT CORONARY BYPASS (MISCELLANEOUS) ×9 IMPLANT
NS IRRIG 1000ML POUR BTL (IV SOLUTION) ×18 IMPLANT
PACK OPEN HEART (CUSTOM PROCEDURE TRAY) ×3 IMPLANT
PAD ARMBOARD 7.5X6 YLW CONV (MISCELLANEOUS) ×6 IMPLANT
PAD ELECT DEFIB RADIOL ZOLL (MISCELLANEOUS) ×3 IMPLANT
PENCIL BUTTON HOLSTER BLD 10FT (ELECTRODE) ×3 IMPLANT
PUNCH AORTIC ROTATE  4.5MM 8IN (MISCELLANEOUS) ×3 IMPLANT
PUNCH AORTIC ROTATE 4.0MM (MISCELLANEOUS) IMPLANT
PUNCH AORTIC ROTATE 4.5MM 8IN (MISCELLANEOUS) IMPLANT
PUNCH AORTIC ROTATE 5MM 8IN (MISCELLANEOUS) IMPLANT
SPONGE GAUZE 4X4 12PLY (GAUZE/BANDAGES/DRESSINGS) ×3 IMPLANT
SPONGE GAUZE 4X4 12PLY STER LF (GAUZE/BANDAGES/DRESSINGS) ×3 IMPLANT
SPONGE LAP 18X18 X RAY DECT (DISPOSABLE) ×6 IMPLANT
STRIP CLOSURE SKIN 1/2X4 (GAUZE/BANDAGES/DRESSINGS) ×2 IMPLANT
SURGIFLO W/THROMBIN 8M KIT (HEMOSTASIS) ×3 IMPLANT
SUT BONE WAX W31G (SUTURE) ×3 IMPLANT
SUT MNCRL AB 4-0 PS2 18 (SUTURE) ×3 IMPLANT
SUT PROLENE 3 0 SH DA (SUTURE) IMPLANT
SUT PROLENE 3 0 SH1 36 (SUTURE) IMPLANT
SUT PROLENE 4 0 RB 1 (SUTURE) ×2
SUT PROLENE 4 0 SH DA (SUTURE) ×3 IMPLANT
SUT PROLENE 4-0 RB1 .5 CRCL 36 (SUTURE) ×1 IMPLANT
SUT PROLENE 5 0 C 1 36 (SUTURE) IMPLANT
SUT PROLENE 6 0 C 1 30 (SUTURE) IMPLANT
SUT PROLENE 6 0 CC (SUTURE) ×9 IMPLANT
SUT PROLENE 8 0 BV175 6 (SUTURE) IMPLANT
SUT PROLENE BLUE 7 0 (SUTURE) ×18 IMPLANT
SUT SILK  1 MH (SUTURE)
SUT SILK 1 MH (SUTURE) IMPLANT
SUT SILK 2 0 SH CR/8 (SUTURE) ×3 IMPLANT
SUT SILK 3 0 SH CR/8 (SUTURE) IMPLANT
SUT STEEL 6MS V (SUTURE) ×12 IMPLANT
SUT STEEL STERNAL CCS#1 18IN (SUTURE) ×3 IMPLANT
SUT STEEL SZ 6 DBL 3X14 BALL (SUTURE) ×3 IMPLANT
SUT VIC AB 0 CT1 18XCR BRD 8 (SUTURE) ×1 IMPLANT
SUT VIC AB 0 CT1 8-18 (SUTURE) ×2
SUT VIC AB 1 CTX 18 (SUTURE) ×6 IMPLANT
SUT VIC AB 1 CTX 36 (SUTURE) ×6
SUT VIC AB 1 CTX36XBRD ANBCTR (SUTURE) ×3 IMPLANT
SUT VIC AB 2-0 CT1 27 (SUTURE) ×2
SUT VIC AB 2-0 CT1 TAPERPNT 27 (SUTURE) ×1 IMPLANT
SUT VIC AB 2-0 CTX 27 (SUTURE) IMPLANT
SUT VIC AB 3-0 X1 27 (SUTURE) IMPLANT
SUTURE E-PAK OPEN HEART (SUTURE) ×3 IMPLANT
SYSTEM SAHARA CHEST DRAIN ATS (WOUND CARE) ×3 IMPLANT
TAPE CLOTH SURG 4X10 WHT LF (GAUZE/BANDAGES/DRESSINGS) ×3 IMPLANT
TAPE PAPER 3X10 WHT MICROPORE (GAUZE/BANDAGES/DRESSINGS) ×3 IMPLANT
TOWEL OR 17X24 6PK STRL BLUE (TOWEL DISPOSABLE) ×6 IMPLANT
TOWEL OR 17X26 10 PK STRL BLUE (TOWEL DISPOSABLE) ×6 IMPLANT
TRAY FOLEY IC TEMP SENS 14FR (CATHETERS) IMPLANT
TUBING INSUFFLATION 10FT LAP (TUBING) ×3 IMPLANT
UNDERPAD 30X30 INCONTINENT (UNDERPADS AND DIAPERS) ×3 IMPLANT
WATER STERILE IRR 1000ML POUR (IV SOLUTION) ×6 IMPLANT

## 2014-01-09 NOTE — Preoperative (Signed)
Beta Blockers   Reason not to administer Beta Blockers:Not Applicable 

## 2014-01-09 NOTE — Progress Notes (Signed)
Echocardiogram Echocardiogram Transesophageal has been performed.  [name] 01/09/2014, 8:59 AM

## 2014-01-09 NOTE — Progress Notes (Signed)
The patient was examined and preop studies reviewed. There has been no change from the prior exam and the patient is ready for surgery.   Plan CABG on L Caracci today

## 2014-01-09 NOTE — Transfer of Care (Signed)
Immediate Anesthesia Transfer of Care Note  Patient: [name]  Procedure(s) Performed: Procedure(s) with comments: CORONARY ARTERY BYPASS GRAFTING (CABG) x 5 using left internal mammary artery and right leg greater saphenous vein harvested endoscopically (N/A) - please use bed extenders and breast binder INTRAOPERATIVE TRANSESOPHAGEAL ECHOCARDIOGRAM (N/A)  Patient Location: SICU  Anesthesia Type:General  Level of Consciousness: sedated and Patient remains intubated per anesthesia plan  Airway & Oxygen Therapy: Patient remains intubated per anesthesia plan and Patient placed on Ventilator (see vital sign flow sheet for setting)  Post-op Assessment: Post -op Vital signs reviewed and stable, report given to RN at bedside Salomon Fick [place])  Post vital signs: Reviewed and stable  Complications: No apparent anesthesia complications

## 2014-01-09 NOTE — Anesthesia Preprocedure Evaluation (Addendum)
Anesthesia Evaluation  Patient identified by MRN, date of birth, ID band Patient awake    Reviewed: Allergy & Precautions, H&P , NPO status , Patient's Chart, lab work & pertinent test results  Airway       Dental  (+) Dental Advisory Given   Pulmonary asthma , sleep apnea ,          Cardiovascular hypertension, + angina + CAD and + Past MI  01/05/14- Left ventricle: Limited apical views. No gross regional wall motion abnormalities. Papillary muscle calcification noted. The cavity size was normal. Wall thickness was normal. Systolic function was normal. The estimated ejection fraction was in the range of 60% to 65%. Left ventricular diastolic function parameters were normal. - Aortic valve: Mildly calcified annulus. Mildly thickened   leaflets. Transvalvular velocity was within the normal   range. There was no stenosis. - Mitral valve: Calcified annulus. Trivial regurgitation. - Left atrium: The atrium was mildly dilated.    Neuro/Psych  Headaches,    GI/Hepatic GERD-  Medicated,  Endo/Other  Morbid obesity  Renal/GU      Musculoskeletal  (+) Arthritis -,   Abdominal   Peds  Hematology  (+) anemia ,   Anesthesia Other Findings   Reproductive/Obstetrics                          Anesthesia Physical Anesthesia Plan  ASA: III  Anesthesia Plan: General   Post-op Pain Management:    Induction: Intravenous  Airway Management Planned: Oral ETT  Additional Equipment: Arterial line, PA Cath, CVP and TEE  Intra-op Plan:   Post-operative Plan: Post-operative intubation/ventilation  Informed Consent: I have reviewed the patients History and Physical, chart, labs and discussed the procedure including the risks, benefits and alternatives for the proposed anesthesia with the patient or authorized representative who has indicated his/her understanding and acceptance.   Dental advisory given  Plan  Discussed with: Anesthesiologist and Surgeon  Anesthesia Plan Comments:        Anesthesia Quick Evaluation

## 2014-01-09 NOTE — Progress Notes (Signed)
Patient ID: ADILYNNE FITZWATER, female   DOB: 07/22/1956, 58 y.o.   MRN: [id number]  SICU Evening Rounds:   Hemodynamically stable  CI = 1.8 on dop 3, milrinone 0.125  Still on vent. Not awake yet. Urine output good  CT output low  CBC    Component Value Date/Time   WBC 17.7* 01/09/2014 1545   RBC 4.25 01/09/2014 1545   HGB 12.6 01/09/2014 1551   HCT 37.0 01/09/2014 1551   PLT 175 01/09/2014 1545   MCV 79.8 01/09/2014 1545   MCH 26.8 01/09/2014 1545   MCHC 33.6 01/09/2014 1545   RDW 16.4* 01/09/2014 1545   LYMPHSABS 3.2 06/11/2012 0305   MONOABS 0.5 06/11/2012 0305   EOSABS 0.1 06/11/2012 0305   BASOSABS 0.0 06/11/2012 0305     BMET    Component Value Date/Time   NA 140 01/09/2014 1551   K 4.0 01/09/2014 1551   CL 97 01/08/2014 0352   CO2 29 01/08/2014 0352   GLUCOSE 151* 01/09/2014 1551   BUN 12 01/08/2014 0352   CREATININE 0.97 01/08/2014 0352   CREATININE 0.80 10/15/2013 1508   CALCIUM 9.7 01/08/2014 0352   GFRNONAA 63* 01/08/2014 0352   GFRAA 73* 01/08/2014 0352     A/P:  Stable postop course. Continue current plans

## 2014-01-09 NOTE — Anesthesia Postprocedure Evaluation (Signed)
  Anesthesia Post-op Note  Patient: [name]  Procedure(s) Performed: Procedure(s) with comments: CORONARY ARTERY BYPASS GRAFTING (CABG) x 5 using left internal mammary artery and right leg greater saphenous vein harvested endoscopically (N/A) - please use bed extenders and breast binder INTRAOPERATIVE TRANSESOPHAGEAL ECHOCARDIOGRAM (N/A)  Patient Location: SICU  Anesthesia Type:General  Level of Consciousness: sedated and Patient remains intubated per anesthesia plan  Airway and Oxygen Therapy: Patient remains intubated per anesthesia plan and Patient placed on Ventilator (see vital sign flow sheet for setting)  Post-op Pain: none  Post-op Assessment: Post-op Vital signs reviewed, Patient's Cardiovascular Status Stable, Respiratory Function Stable, Patent Airway, No signs of Nausea or vomiting and Pain level controlled  Post-op Vital Signs: stable  Complications: No apparent anesthesia complications

## 2014-01-09 NOTE — Progress Notes (Signed)
Belonging will be taken over to [facility] for family to pick up

## 2014-01-09 NOTE — Progress Notes (Signed)
Report called to anesth.

## 2014-01-09 NOTE — Brief Op Note (Signed)
01/01/2014 - 01/09/2014  12:53 PM  PATIENT:  [name]  58 y.o. female  PRE-OPERATIVE DIAGNOSIS:  CAD  POST-OPERATIVE DIAGNOSIS:  CAD  PROCEDURE:  CORONARY ARTERY BYPASS GRAFTING x 5 (LIMA-LAD, SVG-D, SVG-OM1-OM2, SVG-RCA) ENDOSCOPIC VEIN HARVEST RIGHT LEG  SURGEON:  Ivin Poot, MD  ASSISTANT: Suzzanne Cloud, PA-C  ANESTHESIA:   general  PATIENT CONDITION:  ICU - intubated and hemodynamically stable.  PRE-OPERATIVE WEIGHT: 143 kg

## 2014-01-10 ENCOUNTER — Encounter (HOSPITAL_COMMUNITY): Payer: Self-pay | Admitting: Dentistry

## 2014-01-10 ENCOUNTER — Inpatient Hospital Stay (HOSPITAL_COMMUNITY): Payer: Medicare Other

## 2014-01-10 DIAGNOSIS — I2510 Atherosclerotic heart disease of native coronary artery without angina pectoris: Secondary | ICD-10-CM

## 2014-01-10 DIAGNOSIS — Z951 Presence of aortocoronary bypass graft: Secondary | ICD-10-CM

## 2014-01-10 LAB — POCT I-STAT 3, ART BLOOD GAS (G3+)
ACID-BASE DEFICIT: 1 mmol/L (ref 0.0–2.0)
BICARBONATE: 24 meq/L (ref 20.0–24.0)
Bicarbonate: 25 mEq/L — ABNORMAL HIGH (ref 20.0–24.0)
Bicarbonate: 24.3 mEq/L — ABNORMAL HIGH (ref 20.0–24.0)
O2 Saturation: 99 %
O2 Saturation: 96 %
O2 Saturation: 96 %
PCO2 ART: 42.2 mmHg (ref 35.0–45.0)
PH ART: 7.382 (ref 7.350–7.450)
PH ART: 7.404 (ref 7.350–7.450)
PH ART: 7.39 (ref 7.350–7.450)
Patient temperature: 37.1
Patient temperature: 37.2
Patient temperature: 37.1
TCO2: 26 mmol/L (ref 0–100)
TCO2: 25 mmol/L (ref 0–100)
TCO2: 25 mmol/L (ref 0–100)
pCO2 arterial: 39 mmHg (ref 35.0–45.0)
pCO2 arterial: 39.7 mmHg (ref 35.0–45.0)
pO2, Arterial: 123 mmHg — ABNORMAL HIGH (ref 80.0–100.0)
pO2, Arterial: 84 mmHg (ref 80.0–100.0)
pO2, Arterial: 82 mmHg (ref 80.0–100.0)

## 2014-01-10 LAB — BASIC METABOLIC PANEL
BUN: 11 mg/dL (ref 6–23)
CO2: 24 mEq/L (ref 19–32)
Calcium: 8.2 mg/dL — ABNORMAL LOW (ref 8.4–10.5)
Chloride: 105 mEq/L (ref 96–112)
Creatinine, Ser: 0.79 mg/dL (ref 0.50–1.10)
GFR, EST NON AFRICAN AMERICAN: 89 mL/min — AB (ref >90)
Glucose, Bld: 137 mg/dL — ABNORMAL HIGH (ref 70–99)
POTASSIUM: 4.3 meq/L (ref 3.7–5.3)
SODIUM: 141 meq/L (ref 137–147)

## 2014-01-10 LAB — POCT I-STAT, CHEM 8
BUN: 14 mg/dL (ref 6–23)
Calcium, Ion: 1.16 mmol/L (ref 1.12–1.23)
Chloride: 100 mEq/L (ref 96–112)
Creatinine, Ser: 1.1 mg/dL (ref 0.50–1.10)
Glucose, Bld: 160 mg/dL — ABNORMAL HIGH (ref 70–99)
HEMATOCRIT: 29 % — AB (ref 36.0–46.0)
Hemoglobin: 9.9 g/dL — ABNORMAL LOW (ref 12.0–15.0)
POTASSIUM: 3.9 meq/L (ref 3.7–5.3)
SODIUM: 138 meq/L (ref 137–147)
TCO2: 22 mmol/L (ref 0–100)

## 2014-01-10 LAB — GLUCOSE, CAPILLARY
GLUCOSE-CAPILLARY: 106 mg/dL — AB (ref 70–99)
GLUCOSE-CAPILLARY: 116 mg/dL — AB (ref 70–99)
GLUCOSE-CAPILLARY: 117 mg/dL — AB (ref 70–99)
GLUCOSE-CAPILLARY: 126 mg/dL — AB (ref 70–99)
GLUCOSE-CAPILLARY: 127 mg/dL — AB (ref 70–99)
GLUCOSE-CAPILLARY: 128 mg/dL — AB (ref 70–99)
GLUCOSE-CAPILLARY: 147 mg/dL — AB (ref 70–99)
Glucose-Capillary: 124 mg/dL — ABNORMAL HIGH (ref 70–99)
Glucose-Capillary: 136 mg/dL — ABNORMAL HIGH (ref 70–99)
Glucose-Capillary: 105 mg/dL — ABNORMAL HIGH (ref 70–99)
Glucose-Capillary: 125 mg/dL — ABNORMAL HIGH (ref 70–99)
Glucose-Capillary: 126 mg/dL — ABNORMAL HIGH (ref 70–99)
Glucose-Capillary: 121 mg/dL — ABNORMAL HIGH (ref 70–99)
Glucose-Capillary: 128 mg/dL — ABNORMAL HIGH (ref 70–99)
Glucose-Capillary: 111 mg/dL — ABNORMAL HIGH (ref 70–99)
Glucose-Capillary: 116 mg/dL — ABNORMAL HIGH (ref 70–99)
Glucose-Capillary: 126 mg/dL — ABNORMAL HIGH (ref 70–99)
Glucose-Capillary: 129 mg/dL — ABNORMAL HIGH (ref 70–99)

## 2014-01-10 LAB — PREPARE PLATELET PHERESIS: Unit division: 0

## 2014-01-10 LAB — CBC
HCT: 28.6 % — ABNORMAL LOW (ref 36.0–46.0)
HEMATOCRIT: 26.4 % — AB (ref 36.0–46.0)
HEMOGLOBIN: 9 g/dL — AB (ref 12.0–15.0)
Hemoglobin: 9.6 g/dL — ABNORMAL LOW (ref 12.0–15.0)
MCH: 26.9 pg (ref 26.0–34.0)
MCH: 27.4 pg (ref 26.0–34.0)
MCHC: 33.6 g/dL (ref 30.0–36.0)
MCHC: 34.1 g/dL (ref 30.0–36.0)
MCV: 80.1 fL (ref 78.0–100.0)
MCV: 80.5 fL (ref 78.0–100.0)
PLATELETS: 175 10*3/uL (ref 150–400)
Platelets: 150 10*3/uL (ref 150–400)
RBC: 3.57 MIL/uL — ABNORMAL LOW (ref 3.87–5.11)
RBC: 3.28 MIL/uL — AB (ref 3.87–5.11)
RDW: 16.3 % — AB (ref 11.5–15.5)
RDW: 16.7 % — ABNORMAL HIGH (ref 11.5–15.5)
WBC: 15.9 10*3/uL — ABNORMAL HIGH (ref 4.0–10.5)
WBC: 16.3 10*3/uL — AB (ref 4.0–10.5)

## 2014-01-10 LAB — CREATININE, SERUM
CREATININE: 1.01 mg/dL (ref 0.50–1.10)
GFR, EST AFRICAN AMERICAN: 69 mL/min — AB (ref >90)
GFR, EST NON AFRICAN AMERICAN: 60 mL/min — AB (ref >90)

## 2014-01-10 LAB — MAGNESIUM
MAGNESIUM: 2.7 mg/dL — AB (ref 1.5–2.5)
MAGNESIUM: 2.5 mg/dL (ref 1.5–2.5)

## 2014-01-10 MED ORDER — LEVALBUTEROL HCL 0.63 MG/3ML IN NEBU
0.6300 mg | INHALATION_SOLUTION | Freq: Four times a day (QID) | RESPIRATORY_TRACT | Status: DC
  Administered 2014-01-10 – 2014-01-14 (×18): 0.63 mg via RESPIRATORY_TRACT
  Filled 2014-01-10 (×31): qty 3

## 2014-01-10 MED ORDER — LEVALBUTEROL HCL 0.63 MG/3ML IN NEBU
INHALATION_SOLUTION | RESPIRATORY_TRACT | Status: AC
  Filled 2014-01-10: qty 3

## 2014-01-10 MED ORDER — ENOXAPARIN SODIUM 40 MG/0.4ML ~~LOC~~ SOLN
40.0000 mg | Freq: Every day | SUBCUTANEOUS | Status: DC
  Administered 2014-01-10 – 2014-01-20 (×11): 40 mg via SUBCUTANEOUS
  Filled 2014-01-10 (×12): qty 0.4

## 2014-01-10 MED ORDER — INSULIN ASPART 100 UNIT/ML ~~LOC~~ SOLN
0.0000 [IU] | SUBCUTANEOUS | Status: DC
  Administered 2014-01-10 – 2014-01-12 (×6): 2 [IU] via SUBCUTANEOUS

## 2014-01-10 MED ORDER — INSULIN DETEMIR 100 UNIT/ML ~~LOC~~ SOLN
40.0000 [IU] | Freq: Once | SUBCUTANEOUS | Status: AC
  Administered 2014-01-10: 40 [IU] via SUBCUTANEOUS
  Filled 2014-01-10: qty 0.4

## 2014-01-10 MED ORDER — INSULIN DETEMIR 100 UNIT/ML ~~LOC~~ SOLN
30.0000 [IU] | Freq: Every day | SUBCUTANEOUS | Status: DC
  Administered 2014-01-11 – 2014-01-14 (×4): 30 [IU] via SUBCUTANEOUS
  Filled 2014-01-10 (×6): qty 0.3

## 2014-01-10 MED FILL — Potassium Chloride Inj 2 mEq/ML: INTRAVENOUS | Qty: 40 | Status: AC

## 2014-01-10 MED FILL — Electrolyte-R (PH 7.4) Solution: INTRAVENOUS | Qty: 6000 | Status: AC

## 2014-01-10 MED FILL — Mannitol IV Soln 20%: INTRAVENOUS | Qty: 500 | Status: AC

## 2014-01-10 MED FILL — Lidocaine HCl IV Inj 20 MG/ML: INTRAVENOUS | Qty: 10 | Status: AC

## 2014-01-10 MED FILL — Sodium Bicarbonate IV Soln 8.4%: INTRAVENOUS | Qty: 50 | Status: AC

## 2014-01-10 MED FILL — Albumin, Human Inj 5%: INTRAVENOUS | Qty: 250 | Status: AC

## 2014-01-10 MED FILL — Magnesium Sulfate Inj 50%: INTRAMUSCULAR | Qty: 10 | Status: AC

## 2014-01-10 MED FILL — Heparin Sodium (Porcine) Inj 1000 Unit/ML: INTRAMUSCULAR | Qty: 10 | Status: AC

## 2014-01-10 MED FILL — Heparin Sodium (Porcine) Inj 1000 Unit/ML: INTRAMUSCULAR | Qty: 30 | Status: AC

## 2014-01-10 NOTE — Addendum Note (Signed)
Addendum created 01/10/14 9628 by Carola Frost, CRNA   Modules edited: Anesthesia Medication Administration

## 2014-01-10 NOTE — Op Note (Signed)
[name], HODGKINSON NO.:  [id number]  MEDICAL RECORD NO.:  [id number]  LOCATION:  2S03C                        FACILITY:  [facility]  PHYSICIAN:  Ivin Poot, M.D.  DATE OF BIRTH:  [birth year]  DATE OF PROCEDURE:  01/09/2014 DATE OF DISCHARGE:                              OPERATIVE REPORT   OPERATION: 1. Coronary artery bypass grafting x5 (left internal mammary artery to     LAD, saphenous vein graft to diagonal, sequential saphenous vein     graft to OM1 and OM2, saphenous vein graft to posterior     descending). 2. Endoscopic harvest of right leg greater saphenous vein.  SURGEON:  Ivin Poot, MD.  ASSISTANT:  Suzzanne Cloud, PA-C.  PREOPERATIVE DIAGNOSIS:  Unstable angina with severe 3-vessel coronary artery disease.  POSTOPERATIVE DIAGNOSIS:  Unstable angina with severe 3-vessel coronary artery disease.  ANESTHESIA:  General by Dr. Kate Sable.  INDICATIONS:  The patient is a very nice 58 year old, morbidly obese, 330 pounds female who presented to the hospital with unstable angina. Cardiac enzymes were marginally elevated, and Cardiology consultation recommended cardiac catheterization.  This was performed by Dr. Daneen Schick who demonstrated severe multivessel CAD with 95% stenosis of the RCA, 90% stenosis of the LAD and 95% stenosis of the circumflex, OM 1, and OM 2.  She had a 90% stenosis of the diagonal.  Her EF was fairly well preserved.  She was felt to be candidate for surgical coronary revascularization despite her markedly elevated BMI. Prior to surgery, I examined the patient in her hospital room on several occasions and reviewed results of the cardiac catheterizations and echocardiogram with the patient and her family.  I discussed indications and expected benefits of coronary artery bypass surgery for treatment of her coronary artery disease.  I discussed the major aspects of surgery including the location of the surgical  incisions, the use of general anesthesia and cardiopulmonary bypass, and the expected postoperative hospital recovery.  I clearly informed the patient of the high risks of the operation due to her morbid obesity with increased risks of pulmonary problems, bleeding problems, wound infection, problems, and death.  She also understood the routine risks of coronary artery bypass surgery including risks of stroke, MI, blood transfusion requirement, infection, arrhythmias, pleural effusion, and death.  After reviewing all these issues, she demonstrated her understanding and agreed to proceed with surgery under what I felt was an informed consent.  OPERATIVE FINDINGS: 1. Adequate conduit although areas of the vein were somewhat scarred     and cannot be used. 2. The patient did not require intraoperative blood cell transfusion. 3. Well preserved LV function on TEE following separation from     cardiopulmonary bypass.  OPERATION:  The patient was brought to the operating room and placed supine on the operating room table where general anesthesia was induced under invasive hemodynamic monitoring.  The chest, abdomen, and legs were prepped with Betadine and draped as a sterile field.  A sternal incision was made as the saphenous vein was harvested endoscopically from the right leg.  The left internal mammary artery was harvested from the left chest wall with difficulty due to the patient's  obese body habitus.  The mammary artery was 1.5-mm vessel with excellent flow.  The sternal retractor was placed using the deep blades.  The pericardium was opened and suspended.  Pursestrings were placed in the ascending aorta and right atrium and after the vein had been harvested, the patient was fully heparinized and cannulated and placed on cardiopulmonary bypass.  The coronary arteries were identified for grafting and the mammary artery and vein grafts were prepared for the distal anastomoses.  The  cardioplegia cannulas were placed for both antegrade and retrograde cold blood cardioplegia and the patient was cooled to 32 degrees.  The aortic cross-clamp was applied and 1 liter of cold blood cardioplegia was delivered in split doses between the antegrade aortic and retrograde coronary sinus catheters.  There was good cardioplegic arrest and septal temperature dropped less than 12 degrees.  Cardioplegia was delivered every 20 minutes.  The distal coronary anastomoses were performed.  The first distal anastomosis was to the posterior descending branch of the right coronary. The right coronary had a distal 95% stenosis.  The posterior descending was a 1.5-mm vessel.  A reverse saphenous vein was sewn end-to-side with running 7-0 Prolene with good flow through the graft.  Cardioplegia was redosed.  The second and third distal anastomoses consisted of a sequential vein graft to the OM 1 and OM 2.  The OM 1 was a 1.5-mm vessel with proximal 95% stenosis.  A side-to-side anastomosis with the vein was constructed using running 7-0 Prolene.  The third distal anastomosis was the continuation of the sequential vein graft to the OM2.  This was a large 1.7-mm vessel with proximal 95% stenosis.  The end of vein was sewn end- to-side with running 7-0 Prolene with good flow through the graft. Cardioplegia was redosed.  The fourth distal anastomosis was to the diagonal branch of the LAD. This was a 1.5 mm heavily diseased vessel with proximal 90% stenosis.  A reverse saphenous vein was sewn end-to-side with running 7-0 Prolene with good flow through the graft.  Cardioplegia was redosed.  The fifth distal anastomosis was to the distal third of the LAD.  This was a 1.7-mm vessel with proximal 90% stenosis.  The left IMA pedicle was brought through an opening in the left lateral pericardium, was brought down onto the LAD and sewn end-to-side with running 8-0 Prolene. There was excellent flow  through the anastomosis after briefly releasing the pedicle bulldog on the mammary artery.  The bulldog was reapplied and the pedicle was secured to the epicardium with 6-0 Prolene. Cardioplegia was redosed.  While the cross-clamp was still in place, 3 proximal vein anastomoses were performed on the ascending aortic root with a 4.0-mm punch running 6-0 Prolene.  Prior to tying down the final proximal anastomosis, air was vented from the coronaries with a dose of retrograde warm blood cardioplegia and the cross-clamp was removed.  The heart resumed a spontaneous rhythm.  The vein grafts were de-aired and opened, then each graft had good flow and hemostasis was documented at the proximal and distal sites.  The cardioplegia cannulas were removed.  The patient was rewarmed.  Temporary pacing wires were applied.  The lungs were expanded, the ventilator was resumed.  When the patient was adequately rewarmed, she was weaned from cardiopulmonary bypass on renal dose dopamine.  Blood pressure and cardiac output were stable.  Transesophageal echo probe showed the global LV function to be well preserved.  The patient was given protamine without adverse reaction.  The cannulas were removed. The mediastinum was irrigated.  There was diffuse coagulopathy, and the patient received a unit of platelets with improved coagulation function.  The pericardium was closed over the aorta.  Anterior mediastinal and left pleural chest tube were placed and brought out through separate incisions.  The sternum was closed with a wire double-stranded.  The pectoralis fascia was closed with interrupted #1 Vicryl.  The subcutaneous was closed with interrupted 0 Vicryl.  The skin was closed with interrupted steel staples.  Sterile dressings were applied.  The patient was then transported back to the ICU in critical but stable condition.  Total cardiopulmonary bypass time was 150 minutes.     Ivin Poot,  M.D.     PV/MEDQ  D:  01/09/2014  T:  01/10/2014  Job:  [id number]  cc:   Belva Crome, M.D.

## 2014-01-10 NOTE — Progress Notes (Signed)
Patient on glucostabilizer for surgery.  No diabetes noted in history.  No diabetes medications taken at home.  HgbA1C is 5.1%.  Will probably not need basal insulin at discharge.  Will continue to follow while in hospital. Harvel Ricks RN BSN CDE

## 2014-01-10 NOTE — Procedures (Signed)
Extubation Procedure Note  Patient Details:   Name: [name] DOB: [birth year] MRN: [id number]   Airway Documentation:     Evaluation  O2 sats: stable throughout Complications: No apparent complications Patient did tolerate procedure well. Bilateral Breath Sounds: Diminished Suctioning: Airway Yes Patient tolerated the rapid wean. NIF of -35 and VC of .7 L achieved. Positive for cuff leak. Patient extubated to a 3 Lpm nasal cannula. No signs of dyspnea or stridor noted. Patient instructed on the Incentive Spirometer with achieving 500 mL several times. Patient resting comfortably.  Myrtie Neither 01/10/2014, 8:59 AM

## 2014-01-10 NOTE — Progress Notes (Signed)
[facility] BRIEF SICU PROGRESS NOTE  1 Day Post-Op  S/P Procedure(s) (LRB): CORONARY ARTERY BYPASS GRAFTING (CABG) x 5 using left internal mammary artery and right leg greater saphenous vein harvested endoscopically (N/A) INTRAOPERATIVE TRANSESOPHAGEAL ECHOCARDIOGRAM (N/A)   Stable day Sitting up in chair NSR w/ stable BP off neo on low dose dopamine O2 sats 99% on 4 L/min via [place] Diuresing some  Plan: Continue current plan  [name],[name] 01/10/2014 5:06 PM

## 2014-01-10 NOTE — Progress Notes (Signed)
Orthopedic Tech Progress Note Patient Details:  [name] 1956-06-25 [id number]  Ortho Devices Type of Ortho Device: Abdominal binder Ortho Device/Splint Location: abdomen Ortho Device/Splint Interventions: Ordered Abdominal binder left in room with pt; rn notified  Hildred Priest 01/10/2014, 7:35 PM

## 2014-01-10 NOTE — Consult Note (Signed)
DENTAL CONSULTATION  Date of Consultation:  01/10/2014 Patient Name:   [name] Date of Birth:   24-Nov-1955 Medical Record Number: [id number]  VITALS: BP 93/40  Pulse 87  Temp(Src) 99 F (37.2 C) (Core (Comment))  Resp 24  Ht 5\' 8"  (1.727 m)  Wt 331 lb 12.7 oz (150.5 kg)  BMI 50.46 kg/m2  SpO2 98%  LMP 10/26/2013   CHIEF COMPLAINT: The patient was referred for evaluation of poor dentition and history of dental pain.  HPI: [name] is a 58 year old female recently diagnosed with multiple vessel coronary artery disease. Patient subsequently developed upper right and lower right quadrant tooth pain. Patient underwent 5 vessel coronary artery bypass graft procedure Dr. Prescott Gum due to the extensive nature of the coronary artery disease. A dental consultation was then requested to evaluate poor dentition and history of dental pain.  The patient currently has a history of dental pain coming from the upper right and lower right quadrants, although this appears to be nonspecific in nature. Patient describes she, intermittent pain that was relieved with the pain medication provided to her. Patient indicates that reached an intensity of 9/10 it is currently 0/10-today.  Patient also indicates that the maxillary tooth #8 has hurt from time to time over the past year or so. Patient has not seen a dentist for 2-3 years by her report. Patient cannot remember the name the dentist at this time.  PROBLEM LIST: Patient Active Problem List   Diagnosis Date Noted  . S/P CABG x 5 01/09/2014  . Coronary atherosclerosis of native coronary artery 01/05/2014  . Intermediate coronary syndrome 01/04/2014  . Positive cardiac stress test 01/03/2014  . Right shoulder pain 01/02/2014  . Anginal chest pain at rest 01/01/2014  . Influenza with other respiratory manifestations 12/13/2013  . Chipped tooth 11/07/2013  . Pruritic rash 09/19/2013  . Scabies 09/06/2013  . Rheumatoid arteritis - possible  08/28/2013  . Complex endometrial hyperplasia with atypia 06/27/2013  . Obesity, Class III, BMI 40-49.9 (morbid obesity) 05/10/2012  . EXTERNAL HEMORRHOIDS 02/02/2012  . Chronic pain 01/07/2012  . Iron deficiency anemia, unspecified 12/08/2011  . SLEEP APNEA, OBSTRUCTIVE, MILD 03/27/2010  . POSTMENOPAUSAL SYNDROME 02/12/2010  . HEMATURIA UNSPECIFIED 12/15/2009  . TINNITUS, LEFT 09/18/2009  . DENTAL PAIN 01/21/2009  . Pain in joint, lower leg 01/07/2009  . BUNIONS, BILATERAL 01/07/2009  . GERD 08/06/2008  . CONSTIPATION 08/06/2008  . SINUSITIS 06/24/2008  . HYPERLIPIDEMIA 10/31/2007  . OBESITY 06/30/2007  . HYPERTENSION 06/30/2007  . ALLERGIC RHINITIS 06/30/2007  . ASTHMA 06/30/2007    PMH: Past Medical History  Diagnosis Date  . Seasonal allergies   . Asthma     History of  . Hypertension   . Iron deficiency 12-07-2011  . Anemia   . GERD (gastroesophageal reflux disease)   . Heart murmur   . Osteoporosis   . PONV (postoperative nausea and vomiting)   . Dysrhythmia     occ palpitations   . Sleep apnea     no CPAP machine , border[place]   . Headache(784.0)     occasional   . Osteoarthritis     osteoarthritis   . Morbid obesity   . Acute urinary retention s/p Foley 01/07/2012  . Hemorrhoids, internal, with bleeding & prolapse 12/13/2011  . Myocardial infarction     unsure when; per cardiologist report  . Chronic headaches   . Coronary artery disease   . Anginal pain     PSH: Past Surgical  History  Procedure Laterality Date  . Knee surgery      Both knees  . Total knee arthroplasty  04/29/2011    Left knee  . Multiple tooth extractions    . Wisdom tooth extraction    . Colonoscopy  2009  . Knee arthroscopy  1991  . Transanal hemorrhoidal dearterliaization  01/06/12    with external hemorrhoid removal    ALLERGIES: Allergies  Allergen Reactions  . Cephalosporins Anaphylaxis    Tongue swelling, gum pain  . Codeine Other (See Comments)    hallucinations  .  Latex Itching  . Penicillins     Pt reports no longer allergic  . Prednisone     Heart beating fast       MEDICATIONS: Current Facility-Administered Medications  Medication Dose Route Frequency Provider Last Rate Last Dose  . 0.45 % sodium chloride infusion   Intravenous Continuous Coolidge Breeze, PA-C 20 mL/hr at 01/09/14 1530 20 mL/hr at 01/09/14 1530  . 0.9 %  sodium chloride infusion   Intravenous Continuous Coolidge Breeze, PA-C 20 mL/hr at 01/09/14 1620 20 mL at 01/09/14 1620  . 0.9 %  sodium chloride infusion  250 mL Intravenous Continuous Gina L Collins, PA-C      . acetaminophen (TYLENOL) tablet 1,000 mg  1,000 mg Oral 4 times per day Coolidge Breeze, PA-C       Or  . acetaminophen (TYLENOL) solution 1,000 mg  1,000 mg Per Tube 4 times per day Coolidge Breeze, PA-C   1,000 mg at 01/10/14 0551  . albumin human 5 % solution 250 mL  250 mL Intravenous Q15 min PRN Coolidge Breeze, PA-C   250 mL at 01/10/14 0309  . aspirin EC tablet 325 mg  325 mg Oral Daily Coolidge Breeze, PA-C       Or  . aspirin chewable tablet 324 mg  324 mg Per Tube Daily Gina L Collins, PA-C      . bisacodyl (DULCOLAX) EC tablet 10 mg  10 mg Oral Daily Gina L Collins, PA-C       Or  . bisacodyl (DULCOLAX) suppository 10 mg  10 mg Rectal Daily Gina L Collins, PA-C      . cycloSPORINE (RESTASIS) 0.05 % ophthalmic emulsion 1 drop  1 drop Both Eyes BID Jenetta Downer, MD   1 drop at 01/09/14 2306  . dexmedetomidine (PRECEDEX) 200 MCG/50ML infusion  0.1-0.7 mcg/kg/hr Intravenous Continuous Coolidge Breeze, PA-C   0.2 mcg/kg/hr at 01/10/14 0700  . docusate sodium (COLACE) capsule 200 mg  200 mg Oral Daily Coolidge Breeze, PA-C      . DOPamine (INTROPIN) 800 mg in dextrose 5 % 250 mL infusion  2-4 mcg/kg/min Intravenous Titrated Ivin Poot, MD 10.7 mL/hr at 01/10/14 0800 4 mcg/kg/min at 01/10/14 0800  . enoxaparin (LOVENOX) injection 40 mg  40 mg Subcutaneous QHS Gaye Pollack, MD      . insulin aspart (novoLOG)  injection 0-24 Units  0-24 Units Subcutaneous 6 times per day Gaye Pollack, MD      . Derrill Memo ON 01/11/2014] insulin detemir (LEVEMIR) injection 30 Units  30 Units Subcutaneous Daily Gaye Pollack, MD      . insulin detemir (LEVEMIR) injection 40 Units  40 Units Subcutaneous Once Gaye Pollack, MD      . insulin regular (NOVOLIN R,HUMULIN R) 1 Units/mL in sodium chloride 0.9 % 100 mL infusion   Intravenous Continuous Ivin Poot, MD  3 mL/hr at 01/09/14 1900 3 Units/hr at 01/09/14 1900  . insulin regular bolus via infusion 0-10 Units  0-10 Units Intravenous TID WC Ivin Poot, MD      . lactated ringers infusion   Intravenous Continuous Coolidge Breeze, PA-C 20 mL/hr at 01/09/14 1530 20 mL/hr at 01/09/14 1530  . levalbuterol (XOPENEX) nebulizer solution 0.63 mg  0.63 mg Nebulization Q6H Gaye Pollack, MD   0.63 mg at 01/10/14 0852  . metoCLOPramide (REGLAN) injection 10 mg  10 mg Intravenous 4 times per day Coolidge Breeze, PA-C   10 mg at 01/10/14 0551  . metoprolol (LOPRESSOR) injection 2.5-5 mg  2.5-5 mg Intravenous Q2H PRN Coolidge Breeze, PA-C      . metoprolol tartrate (LOPRESSOR) tablet 12.5 mg  12.5 mg Oral BID Coolidge Breeze, PA-C       Or  . metoprolol tartrate (LOPRESSOR) 25 mg/10 mL oral suspension 12.5 mg  12.5 mg Per Tube BID Coolidge Breeze, PA-C      . midazolam (VERSED) injection 2 mg  2 mg Intravenous Q1H PRN Coolidge Breeze, PA-C   2 mg at 01/10/14 0258  . milrinone (PRIMACOR) infusion 200 mcg/mL (0.2 mg/ml)  0.125 mcg/kg/min Intravenous Continuous Ivin Poot, MD 2.7 mL/hr at 01/10/14 0900 0.063 mcg/kg/min at 01/10/14 0900  . morphine 2 MG/ML injection 2-5 mg  2-5 mg Intravenous Q1H PRN Coolidge Breeze, PA-C   2 mg at 01/10/14 0848  . nitroGLYCERIN 0.2 mg/mL in dextrose 5 % infusion  0-100 mcg/min Intravenous Continuous Coolidge Breeze, PA-C   5 mcg/min at 01/09/14 1630  . ondansetron (ZOFRAN) injection 4 mg  4 mg Intravenous Q6H PRN Coolidge Breeze, PA-C      . oxyCODONE  (Oxy IR/ROXICODONE) immediate release tablet 5-10 mg  5-10 mg Oral Q3H PRN Coolidge Breeze, PA-C   10 mg at 01/10/14 1034  . [START ON 01/11/2014] pantoprazole (PROTONIX) EC tablet 40 mg  40 mg Oral Daily Gina L Collins, PA-C      . phenylephrine (NEO-SYNEPHRINE) 20 mg in dextrose 5 % 250 mL infusion  0-100 mcg/min Intravenous Continuous Coolidge Breeze, PA-C 11.3 mL/hr at 01/10/14 0800 15 mcg/min at 01/10/14 0800  . sodium chloride 0.9 % injection 3 mL  3 mL Intravenous Q12H Gina L Collins, PA-C      . sodium chloride 0.9 % injection 3 mL  3 mL Intravenous PRN Coolidge Breeze, PA-C        LABS: Lab Results  Component Value Date   WBC 15.9* 01/10/2014   HGB 9.6* 01/10/2014   HCT 28.6* 01/10/2014   MCV 80.1 01/10/2014   PLT 175 01/10/2014      Component Value Date/Time   NA 141 01/10/2014 0430   K 4.3 01/10/2014 0430   CL 105 01/10/2014 0430   CO2 24 01/10/2014 0430   GLUCOSE 137* 01/10/2014 0430   BUN 11 01/10/2014 0430   CREATININE 0.79 01/10/2014 0430   CREATININE 0.80 10/15/2013 1508   CALCIUM 8.2* 01/10/2014 0430   GFRNONAA 89* 01/10/2014 0430   GFRAA >90 01/10/2014 0430   Lab Results  Component Value Date   INR 1.10 01/09/2014   INR 1.05 01/04/2014   INR 1.0 03/06/2013   No results found for this basename: PTT    SOCIAL HISTORY: History   Social History  . Marital Status: Single    Spouse Name: N/A    Number of Children: 2  .  Years of Education: N/A   Occupational History  . Retired    Social History Main Topics  . Smoking status: Never Smoker   . Smokeless tobacco: Never Used  . Alcohol Use: No  . Drug Use: No  . Sexual Activity: Not Currently   Other Topics Concern  . Not on file   Social History Narrative  . No narrative on file    FAMILY HISTORY: Family History  Problem Relation Age of Onset  . Colon cancer Neg Hx   . Diabetes Mother   . Hypertension Mother   . Heart disease Sister     Congestive heart failure  . Diabetes type II Sister   . Kidney disease  Brother   . Heart attack Mother 80     REVIEW OF SYSTEMS: Reviewed from chart for this admission.  DENTAL HISTORY: CHIEF COMPLAINT: The patient was referred for evaluation of poor dentition and history of dental pain.  HPI: [name] is a 58 year old female recently diagnosed with multiple vessel coronary artery disease. Patient subsequently developed upper right and lower right quadrant tooth pain. Patient underwent 5 vessel coronary artery bypass graft procedure Dr. Prescott Gum on 01/09/14 due to the extensive nature of the coronary artery disease. A dental consultation was then requested to evaluate poor dentition and history of dental pain.  The patient currently has a history of dental pain coming from the upper right and lower right quadrants, although this appears to be nonspecific in nature. Patient describes sharp, intermittent pain that was relieved with the pain medication provided to her. Patient indicates that the pain reached an intensity of 9/10, but it is currently 0/10-today.  Patient also indicates that maxillary anterior tooth #8 has hurt from time to time over the past year or so. Patient has not seen a dentist for 2-3 years by her report. Patient cannot remember the name the dentist at this time.  DENTAL EXAMINATION:  GENERAL: The patient is a well-developed, obese female lying in hospital bed in the ICU after her recent coronary artery bypass graft procedure. HEAD AND NECK: I am unable to evaluate for neck lymphadenopathy at this time. Patient denies having a history of temporal mandibular joint problems. INTRAORAL EXAM: Patient has normal saliva. I do not see any evidence of abscess formation. DENTITION: Patient is missing tooth numbers 3, 4, 5, 12, 13, 15, 17, 18, 20, 31, and 32. PERIODONTAL: Patient has chronic periodontitis with bone loss, generalized interval recession, and incipient tooth mobility. There is generalized moderate bone loss noted. There is severe  bone loss associated with tooth numbers 1, 2, and 16. DENTAL CARIES/SUBOPTIMAL RESTORATIONS: There are dental caries involving tooth #8 as well as attrition involving the incisal edges of tooth numbers 7, 8, 9, and 10. ENDODONTIC: Patient has a history of nonspecific acute pulpitis symptoms. Patient does point to the upper right quadrant and lower right quadrants. I do see periapical pathology associated with tooth #1 but no obvious pathology associate with tooth #30. CROWN AND BRIDGE: There are no crown or bridge restorations. PROSTHODONTIC: There are no apparent partial dentures. OCCLUSION: Patient has a poor occlusal scheme secondary to multiple missing teeth, supra-eruption and drifting of the unopposed teeth into the edentulous areas, and lack of replacement of missing teeth with dental prostheses.  RADIOGRAPHIC INTERPRETATION: An orthopantogram was taken on 01/08/2014. There are multiple missing teeth. There is moderate to severe bone loss. There is supra-eruption and drifting of the unopposed teeth into the edentulous areas. There is  a periapical radiolucency associated with the apex of tooth #1. There dental caries associated with tooth #8 and possibly tooth numbers 9 and 10 as well. Multiple dental restorations are noted.  ASSESSMENTS: 1. History of acute pulpitis symptoms 2. Chronic apical periodontitis associated with tooth #1 3. Chronic periodontitis with bone loss 4. Generalized gingival recession 5. Tooth mobility  6. Plaque and calculus accumulations 7. Dental caries 8. Multiple missing teeth 9. Supra-eruption and drifting of the unopposed teeth into the edentulous area 10. Malocclusion 11. Recent coronary artery bypass graft procedure with need to assess medical stability of the patient prior to invasive dental procedures.                                      PLAN/RECOMMENDATIONS: 1. I discussed the risks, benefits, and complications of various treatment options with the  patient in relationship to her medical and dental conditions. We discussed various treatment options to include no treatment, multiple extractions with alveoloplasty, pre-prosthetic surgery as indicated, periodontal therapy, dental restorations, root canal therapy, crown and bridge therapy, implant therapy, and replacement of missing teeth as indicated. We also discussed the need for patient to be medically stable after the coronary artery bypass graft procedure before attempting multiple extractions with alveoloplasty. Currently the plan would be for dental extractions of tooth numbers 1, 2, 8, and 16 with alveoloplasty.  In Dr. Lucianne Lei Trigt's abscence, Dr. Roxy Manns felt that waiting at least 2-4 weeks after cardiac surgery would be prudent before proceeding with dental extractions. Patient will be followed and scheduled for dental procedures as indicated   2. Discussion of findings with medical team and coordination of future medical and dental care as needed.  Lenn Cal, DDS

## 2014-01-10 NOTE — Progress Notes (Signed)
1 Day Post-Op Procedure(s) (LRB): CORONARY ARTERY BYPASS GRAFTING (CABG) x 5 using left internal mammary artery and right leg greater saphenous vein harvested endoscopically (N/A) INTRAOPERATIVE TRANSESOPHAGEAL ECHOCARDIOGRAM (N/A) Subjective:  Intubated but awake.  Stable night  Objective: Vital signs in last 24 hours: Temp:  [96.8 F (36 C)-99 F (37.2 C)] 99 F (37.2 C) (02/19 0800) Pulse Rate:  [56-94] 80 (02/19 0800) Cardiac Rhythm:  [-] Normal sinus rhythm (02/19 0700) Resp:  [0-30] 28 (02/19 0800) BP: (93-141)/(45-82) 95/51 mmHg (02/19 0800) SpO2:  [96 %-100 %] 99 % (02/19 0800) Arterial Line BP: (90-153)/(43-81) 113/50 mmHg (02/19 0800) FiO2 (%):  [40 %-50 %] 40 % (02/19 0740) Weight:  [150.5 kg (331 lb 12.7 oz)] 150.5 kg (331 lb 12.7 oz) (02/19 0545)  Hemodynamic parameters for last 24 hours: PAP: (25-41)/(11-24) 39/21 mmHg CO:  [3.1 L/min-4.8 L/min] 4.6 L/min CI:  [1.3 L/min/m2-1.9 L/min/m2] 1.8 L/min/m2  Intake/Output from previous day: 02/18 0701 - 02/19 0700 In: 6109.9 [I.V.:4092.9; Blood:947; NG/GT:70; IV Piggyback:1000] Out: 7312 [Urine:5065; Blood:1865; Chest Tube:382] Intake/Output this shift: Total I/O In: 70.7 [I.V.:70.7] Out: -   General appearance: alert and cooperative Neurologic: intact Heart: regular rate and rhythm, S1, S2 normal, no murmur, click, rub or gallop Lungs: clear to auscultation bilaterally Extremities: extremities normal, atraumatic, no cyanosis or edema Wound: dressing dry, binder intact  Lab Results:  Recent Labs  01/09/14 2130 01/09/14 2158 01/10/14 0430  WBC 15.9*  --  15.9*  HGB 10.5* 11.2* 9.6*  HCT 31.2* 33.0* 28.6*  PLT 174  --  175   BMET:  Recent Labs  01/08/14 0352  01/09/14 2158 01/10/14 0430  NA 140  < > 138 141  K 3.8  < > 4.2 4.3  CL 97  --  104 105  CO2 29  --   --  24  GLUCOSE 151*  < > 195* 137*  BUN 12  --  9 11  CREATININE 0.97  < > 0.90 0.79  CALCIUM 9.7  --   --  8.2*  < > = values in  this interval not displayed.  PT/INR:  Recent Labs  01/09/14 1545  LABPROT 14.0  INR 1.10   ABG    Component Value Date/Time   PHART 7.382 01/10/2014 0423   HCO3 25.0* 01/10/2014 0423   TCO2 26 01/10/2014 0423   O2SAT 99.0 01/10/2014 0423   CBG (last 3)   Recent Labs  01/10/14 0421 01/10/14 0529 01/10/14 0629  GLUCAP 136* 127* 105*    Assessment/Plan: S/P Procedure(s) (LRB): CORONARY ARTERY BYPASS GRAFTING (CABG) x 5 using left internal mammary artery and right leg greater saphenous vein harvested endoscopically (N/A) INTRAOPERATIVE TRANSESOPHAGEAL ECHOCARDIOGRAM (N/A)  She is hemodynamically stable on dop 4 and milrinone 0.125 with a normal ventricle preop. Will try to get her extubated this am. Her CXR shows low lung volumes and left lung atelectasis. With her morbid obesity she will need attention to pulmonary toilet. Hopefully can get her extubated and up to chair. Wean milrinone, neo and dopamine after extubation. Chest tubes out after dangle today.  Diabetes: start Levemir.  DVT prophylaxix: SCD and Lovenox.    LOS: 9 days    BARTLE,BRYAN K 01/10/2014

## 2014-01-11 ENCOUNTER — Encounter (HOSPITAL_COMMUNITY): Payer: Self-pay | Admitting: Cardiothoracic Surgery

## 2014-01-11 ENCOUNTER — Inpatient Hospital Stay (HOSPITAL_COMMUNITY): Payer: Medicare Other

## 2014-01-11 LAB — CBC
HEMATOCRIT: 25.9 % — AB (ref 36.0–46.0)
Hemoglobin: 8.7 g/dL — ABNORMAL LOW (ref 12.0–15.0)
MCH: 26.9 pg (ref 26.0–34.0)
MCHC: 33.6 g/dL (ref 30.0–36.0)
MCV: 80.2 fL (ref 78.0–100.0)
PLATELETS: 159 10*3/uL (ref 150–400)
RBC: 3.23 MIL/uL — AB (ref 3.87–5.11)
RDW: 16.7 % — ABNORMAL HIGH (ref 11.5–15.5)
WBC: 20.3 10*3/uL — AB (ref 4.0–10.5)

## 2014-01-11 LAB — GLUCOSE, CAPILLARY
Glucose-Capillary: 131 mg/dL — ABNORMAL HIGH (ref 70–99)
Glucose-Capillary: 131 mg/dL — ABNORMAL HIGH (ref 70–99)
Glucose-Capillary: 116 mg/dL — ABNORMAL HIGH (ref 70–99)
Glucose-Capillary: 129 mg/dL — ABNORMAL HIGH (ref 70–99)
Glucose-Capillary: 115 mg/dL — ABNORMAL HIGH (ref 70–99)

## 2014-01-11 LAB — BASIC METABOLIC PANEL
BUN: 20 mg/dL (ref 6–23)
CALCIUM: 8.4 mg/dL (ref 8.4–10.5)
CO2: 24 mEq/L (ref 19–32)
Chloride: 99 mEq/L (ref 96–112)
Creatinine, Ser: 1.52 mg/dL — ABNORMAL HIGH (ref 0.50–1.10)
GFR calc Af Amer: 42 mL/min — ABNORMAL LOW (ref >90)
GFR calc non Af Amer: 36 mL/min — ABNORMAL LOW (ref >90)
Glucose, Bld: 145 mg/dL — ABNORMAL HIGH (ref 70–99)
Potassium: 4.4 mEq/L (ref 3.7–5.3)
SODIUM: 135 meq/L — AB (ref 137–147)

## 2014-01-11 MED ORDER — ALBUMIN HUMAN 5 % IV SOLN
INTRAVENOUS | Status: AC
  Filled 2014-01-11: qty 250

## 2014-01-11 MED ORDER — ALBUMIN HUMAN 5 % IV SOLN
12.5000 g | Freq: Once | INTRAVENOUS | Status: AC
  Administered 2014-01-11: 12.5 g via INTRAVENOUS

## 2014-01-11 NOTE — Progress Notes (Signed)
POD # 2  Felt dizzy earlier, better now  BP 94/72  Pulse 90  Temp(Src) 98 F (36.7 C) (Oral)  Resp 18  Ht 5\' 8"  (1.727 m)  Wt 330 lb 14.6 oz (150.1 kg)  BMI 50.33 kg/m2  SpO2 96%  LMP 10/26/2013  Still on dopaine at 4 mcg/kg/min Neosynephrine being weaned   Intake/Output Summary (Last 24 hours) at 01/11/14 1736 Last data filed at 01/11/14 1700  Gross per 24 hour  Intake 1362.67 ml  Output    720 ml  Net 642.67 ml    Continue dopamine, hild off on diuresis for now

## 2014-01-11 NOTE — Progress Notes (Signed)
Rehab Admissions Coordinator Note:  Patient was screened by Cleatrice Burke for appropriateness for an Inpatient Acute Rehab Consult.  At this time, we are recommending Inpatient Rehab consult pending her progress over the weekend.   Cleatrice Burke 01/11/2014, 5:09 PM  I can be reached at [phone].

## 2014-01-11 NOTE — Evaluation (Signed)
Physical Therapy Evaluation Patient Details Name: [name] MRN: [id number] DOB: 1956/06/26 Today's Date: 01/11/2014 Time: [id number] PT Time Calculation (min): 38 min  PT Assessment / Plan / Recommendation History of Present Illness  Adm 2/10 with chest pain, +MI, cardiac cath 2/12, CABG x5 2/18 PMHX includes TKR, obesity, HTN  Clinical Impression  Patient is s/p CABG surgery resulting in functional limitations due to the deficits listed below (see PT Problem List). Pt lives alone, however feels her neighbor will come stay with her upon discharge. Patient will benefit from skilled PT to increase their independence and safety with mobility to allow discharge to the venue listed below.       PT Assessment  Patient needs continued PT services    Follow Up Recommendations  Supervision/Assistance - 24 hour;CIR    Does the patient have the potential to tolerate intense rehabilitation      Barriers to Discharge Decreased caregiver support ? if neighbor can provide necessary assist    Equipment Recommendations  None recommended by PT    Recommendations for Other Services OT consult   Frequency Min 3X/week    Precautions / Restrictions Precautions Precautions: Sternal Required Braces or Orthoses: Other Brace/Splint Other Brace/Splint: abd binder to support breasts Restrictions Weight Bearing Restrictions:  (sternal precautions)   Pertinent Vitals/Pain MAP 67 supine and 72 with sitting EOB SaO2 90-91% on RA       Mobility  Bed Mobility Overal bed mobility: Needs Assistance;+2 for physical assistance Bed Mobility: Sidelying to Sit;Sit to Sidelying Sidelying to sit: Mod assist;HOB elevated Sit to sidelying: Mod assist;+2 for physical assistance General bed mobility comments: vc for technique to maintain sternal precautions Transfers Overall transfer level: Needs assistance Equipment used: None Transfers: Sit to/from Stand Sit to Stand: Min assist;+2 physical  assistance General transfer comment: pt feeling dizzy/lethargic with difficulty keeping eyes open; +2 for safety Ambulation/Gait Ambulation/Gait assistance: Min assist;+2 physical assistance Ambulation Distance (Feet): 3 Feet Assistive device: 2 person hand held assist General Gait Details: side step to [employer]    Exercises     PT Diagnosis: Difficulty walking;Acute pain  PT Problem List: Decreased range of motion;Decreased activity tolerance;Decreased balance;Decreased mobility;Decreased knowledge of use of DME;Decreased knowledge of precautions;Cardiopulmonary status limiting activity;Obesity;Pain PT Treatment Interventions: DME instruction;Gait training;Functional mobility training;Therapeutic activities;Balance training;Patient/family education     PT Goals(Current goals can be found in the care plan section) Acute Rehab PT Goals Patient Stated Goal: return home with neighbor's assist PT Goal Formulation: With patient Time For Goal Achievement: 01/18/14 Potential to Achieve Goals: Good  Visit Information  Last PT Received On: 01/11/14 Assistance Needed: +2 History of Present Illness: Adm 2/10 with chest pain, +MI, cardiac cath 2/12, CABG x5 2/18 PMHX includes TKR, obesity, HTN       Prior Functioning  Home Living Family/patient expects to be discharged to:: Private residence Living Arrangements: Alone Available Help at Discharge: Neighbor;Available 24 hours/day Type of Home: House (townhouse) Home Access: Level entry Home Layout: One level Home Equipment: Cane - single point;Walker - 2 wheels;Bedside commode;Tub bench;Other (comment) (furniture up on risers; uses BSC over toilet) Prior Function Level of Independence: Independent with assistive device(s) Comments: walked with cane PTA; sometimes walker Communication Communication: No difficulties    Cognition  Cognition Arousal/Alertness: Lethargic Behavior During Therapy: Flat affect Overall Cognitive Status: Within  Functional Limits for tasks assessed    Extremity/Trunk Assessment Upper Extremity Assessment Upper Extremity Assessment: Overall WFL for tasks assessed (within sternal precaution limits) Lower Extremity Assessment Lower Extremity  Assessment: Overall WFL for tasks assessed Cervical / Trunk Assessment Cervical / Trunk Assessment: Other exceptions Cervical / Trunk Exceptions: pt with large breasts and MD ordered abd binder to be used for support   Balance Balance Overall balance assessment: Needs assistance Standing balance support: Bilateral upper extremity supported Standing balance-Leahy Scale: Poor General Comments General comments (skin integrity, edema, etc.): sat EOB 7 minutes with dizziness and lethargy staying the same; max cues to keep eyes open; assist to maintain balance as she closes her eyes  End of Session PT - End of Session Equipment Utilized During Treatment: Other (comment) (abd binder for breasts) Activity Tolerance: Patient limited by fatigue;Patient limited by lethargy Patient left: in bed;with call bell/phone within reach;with nursing/sitter in room Nurse Communication: Mobility status;Other (comment) (lethargy)  GP     Nymir Ringler 01/11/2014, 4:15 PM Pager [phone]

## 2014-01-11 NOTE — Progress Notes (Addendum)
[facility] DAILY ICU PROGRESS NOTE                   [address]            [place] [address]          [phone]   2 Days Post-Op Procedure(s) (LRB): CORONARY ARTERY BYPASS GRAFTING (CABG) x 5 using left internal mammary artery and right leg greater saphenous vein harvested endoscopically (N/A) INTRAOPERATIVE TRANSESOPHAGEAL ECHOCARDIOGRAM (N/A)  Total Length of Stay:  LOS: 10 days   Subjective: Still on neo and dopamine d/t borderlin BP's  Objective: Vital signs in last 24 hours: Temp:  [97.5 F (36.4 C)-99.1 F (37.3 C)] 98 F (36.7 C) (02/20 0746) Pulse Rate:  [78-117] 88 (02/20 0715) Cardiac Rhythm:  [-] Normal sinus rhythm (02/20 0600) Resp:  [0-39] 22 (02/20 0715) BP: (82-159)/(39-144) 86/45 mmHg (02/20 0700) SpO2:  [94 %-100 %] 98 % (02/20 0718) Arterial Line BP: (76-181)/(32-73) 120/48 mmHg (02/20 0715) FiO2 (%):  [40 %] 40 % (02/19 0810) Weight:  [330 lb 14.6 oz (150.1 kg)] 330 lb 14.6 oz (150.1 kg) (02/20 0500)  Filed Weights   01/09/14 0521 01/10/14 0545 01/11/14 0500  Weight: 315 lb 12.2 oz (143.23 kg) 331 lb 12.7 oz (150.5 kg) 330 lb 14.6 oz (150.1 kg)    Weight change: -14.1 oz (-0.4 kg)   Hemodynamic parameters for last 24 hours: PAP: (31-52)/(16-26) 52/26 mmHg CO:  [5.1 L/min-5.6 L/min] 5.6 L/min CI:  [2 L/min/m2-2.2 L/min/m2] 2.2 L/min/m2  Intake/Output from previous day: 02/19 0701 - 02/20 0700 In: 1567.5 [I.V.:1137.5; NG/GT:30; IV Piggyback:400] Out: 1235 [Urine:945; Emesis/NG output:150; Chest Tube:140]  Intake/Output this shift:    Current Meds: Scheduled Meds: . acetaminophen  1,000 mg Oral 4 times per day   Or  . acetaminophen (TYLENOL) oral liquid 160 mg/5 mL  1,000 mg Per Tube 4 times per day  . aspirin EC  325 mg Oral Daily   Or  . aspirin  324 mg Per Tube Daily  . bisacodyl  10 mg Oral Daily   Or  . bisacodyl  10 mg Rectal Daily  . cycloSPORINE  1 drop Both Eyes BID  . docusate sodium  200 mg Oral Daily  .  enoxaparin (LOVENOX) injection  40 mg Subcutaneous QHS  . insulin aspart  0-24 Units Subcutaneous 6 times per day  . insulin detemir  30 Units Subcutaneous Daily  . levalbuterol  0.63 mg Nebulization Q6H  . metoprolol tartrate  12.5 mg Oral BID   Or  . metoprolol tartrate  12.5 mg Per Tube BID  . pantoprazole  40 mg Oral Daily  . sodium chloride  3 mL Intravenous Q12H   Continuous Infusions: . sodium chloride 20 mL/hr (01/09/14 1530)  . sodium chloride 20 mL/hr at 01/11/14 0700  . sodium chloride    . dexmedetomidine Stopped (01/10/14 0800)  . DOPamine 4 mcg/kg/min (01/11/14 0700)  . lactated ringers 20 mL/hr (01/09/14 1530)  . milrinone Stopped (01/10/14 1145)  . nitroGLYCERIN Stopped (01/09/14 1700)  . phenylephrine (NEO-SYNEPHRINE) Adult infusion 40 mcg/min (01/11/14 0700)   PRN Meds:.metoprolol, midazolam, morphine injection, ondansetron (ZOFRAN) IV, oxyCODONE, sodium chloride  General appearance: alert, cooperative, fatigued and no distress Heart: regular rate and rhythm Lungs: dim in bases Abdomen: benign Extremities: + LE edema Wound: dressings CDI  Lab Results: CBC: Recent Labs  01/10/14 1630 01/10/14 1634 01/11/14 0220  WBC 16.3*  --  20.3*  HGB 9.0* 9.9* 8.7*  HCT  26.4* 29.0* 25.9*  PLT 150  --  159   BMET:  Recent Labs  01/10/14 0430  01/10/14 1634 01/11/14 0220  NA 141  --  138 135*  K 4.3  --  3.9 4.4  CL 105  --  100 99  CO2 24  --   --  24  GLUCOSE 137*  --  160* 145*  BUN 11  --  14 20  CREATININE 0.79  < > 1.10 1.52*  CALCIUM 8.2*  --   --  8.4  < > = values in this interval not displayed.  PT/INR:  Recent Labs  01/09/14 1545  LABPROT 14.0  INR 1.10   ABG    Component Value Date/Time   PHART 7.390 01/10/2014 0946   PCO2ART 39.7 01/10/2014 0946   PO2ART 82.0 01/10/2014 0946   HCO3 24.0 01/10/2014 0946   TCO2 22 01/10/2014 1634   ACIDBASEDEF 1.0 01/10/2014 0946   O2SAT 96.0 01/10/2014 0946     Radiology: Dg Chest Portable 1 View In  Am  01/10/2014   CLINICAL DATA:  CABG.  EXAM: PORTABLE CHEST - 1 VIEW  COMPARISON:  DG CHEST 1V PORT dated 01/09/2014  FINDINGS: Endotracheal tube has been withdrawn slightly. Its tip is 2.5 cm above the carina. NG tube, Swan-Ganz catheter, left chest tube in stable position. No pneumothorax. Prior CABG. Cardiomegaly. Low lung volumes remain. Bilateral mild pulmonary infiltrates/edema noted. Atelectatic changes are present. A left pleural effusion cannot be excluded. No pneumothorax.  IMPRESSION: 1. Endotracheal tube has been withdrawn, its tip is 2.5 cm above the carina. Line and tube positions otherwise stable. 2. Low lung volumes with bilateral infiltrative and atelectatic changes. Left pleural effusion cannot be excluded. 3. Prior CABG.  Stable cardiomegaly.   Electronically Signed   By: Marcello Moores  Register   On: 01/10/2014 07:27   Dg Chest Portable 1 View  01/09/2014   CLINICAL DATA:  CABG.  EXAM: PORTABLE CHEST - 1 VIEW  COMPARISON:  DG CHEST 2 VIEW dated 01/05/2014  FINDINGS: Endotracheal tube approximately 8 mm above the carina, proximal repositioning should be considered. Left chest tube noted with tip projected over the left apex. No definite pneumothorax noted. Lung markings are noted in the apex. This region however is difficult to evaluate due to overlying soft tissues. Follow-up chest x-ray suggested. Swan-Ganz catheter noted with tip projected over pulmonary outflow tract. NG tube noted, distal tip is difficult to discern but appears to be below the left hemidiaphragm. Low lung volumes with bilateral perihilar and basilar atelectasis versus infiltrates. Left pleural effusion cannot be excluded. pneumothorax.  IMPRESSION: 1. Endotracheal tube noted 8 mm above the carina, proximal repositioning suggested. 2. Line and tube positions otherwise good. 3. Low lung volumes with bilateral atelectatic and/or infiltrative changes. These results will be called to the ordering clinician or representative by the  Radiologist Assistant, and communication documented in the PACS Dashboard.   Electronically Signed   By: Marcello Moores  Register   On: 01/09/2014 16:17     Assessment/Plan: S/P Procedure(s) (LRB): CORONARY ARTERY BYPASS GRAFTING (CABG) x 5 using left internal mammary artery and right leg greater saphenous vein harvested endoscopically (N/A) INTRAOPERATIVE TRANSESOPHAGEAL ECHOCARDIOGRAM (N/A)  1 stable- wean gtts as able , keep rena dop as creat rising 2 sugars ok 3 H/H fairly stabe 4 increased leukocytosis 5 will need diuresis 6 push pulm toilet/rehab as able  GOLD,WAYNE E 01/11/2014 7:54 AM   Chart reviewed, patient examined, agree with above. Her BP dropped overnight  into the 70's and she was restarted on dopamine and neo. LV function is normal. Hold off on beta blocker until off vasopressors. Will wean vasopressors as tolerated but continue low dose dopamine until creat improves. Her creat bumped a little probably related to pump run and drop in BP. Urine output adequate. This should resolve over the next day or two. Hold off on diuresis for now. CXR shows left lower lobe atelectasis. Work on IS.

## 2014-01-12 LAB — TYPE AND SCREEN
ABO/RH(D): O POS
Antibody Screen: NEGATIVE
Unit division: 0
Unit division: 0
Unit division: 0
Unit division: 0

## 2014-01-12 LAB — CBC
HCT: 23.4 % — ABNORMAL LOW (ref 36.0–46.0)
Hemoglobin: 7.8 g/dL — ABNORMAL LOW (ref 12.0–15.0)
MCH: 26.6 pg (ref 26.0–34.0)
MCHC: 33.3 g/dL (ref 30.0–36.0)
MCV: 79.9 fL (ref 78.0–100.0)
PLATELETS: 163 10*3/uL (ref 150–400)
RBC: 2.93 MIL/uL — AB (ref 3.87–5.11)
RDW: 16.6 % — AB (ref 11.5–15.5)
WBC: 17.4 10*3/uL — ABNORMAL HIGH (ref 4.0–10.5)

## 2014-01-12 LAB — GLUCOSE, CAPILLARY
GLUCOSE-CAPILLARY: 126 mg/dL — AB (ref 70–99)
Glucose-Capillary: 116 mg/dL — ABNORMAL HIGH (ref 70–99)
Glucose-Capillary: 129 mg/dL — ABNORMAL HIGH (ref 70–99)
Glucose-Capillary: 167 mg/dL — ABNORMAL HIGH (ref 70–99)
Glucose-Capillary: 95 mg/dL (ref 70–99)
Glucose-Capillary: 99 mg/dL (ref 70–99)

## 2014-01-12 LAB — BASIC METABOLIC PANEL
BUN: 30 mg/dL — AB (ref 6–23)
CALCIUM: 8.3 mg/dL — AB (ref 8.4–10.5)
CO2: 25 meq/L (ref 19–32)
Chloride: 97 mEq/L (ref 96–112)
Creatinine, Ser: 1.45 mg/dL — ABNORMAL HIGH (ref 0.50–1.10)
GFR calc Af Amer: 45 mL/min — ABNORMAL LOW (ref >90)
GFR calc non Af Amer: 39 mL/min — ABNORMAL LOW (ref >90)
Glucose, Bld: 125 mg/dL — ABNORMAL HIGH (ref 70–99)
Potassium: 4.4 mEq/L (ref 3.7–5.3)
SODIUM: 134 meq/L — AB (ref 137–147)

## 2014-01-12 LAB — PREPARE RBC (CROSSMATCH)

## 2014-01-12 IMAGING — US US PELVIS COMPLETE
1 series · 13 of 25 positions shown · non-contrast
Comparison: None.

CLINICAL DATA: Post menopausal bleeding.



[Series 1: us pelvis complete · 57 acquisitions, 13 frames shown]
[im 1/57]
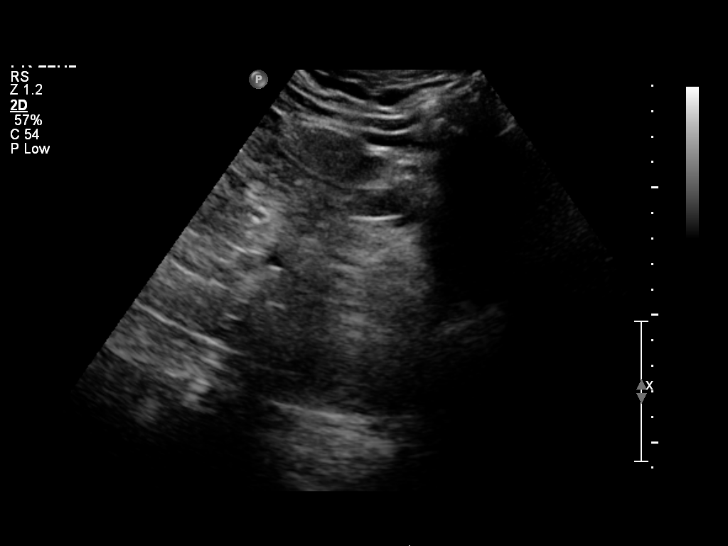
[im 5/57]
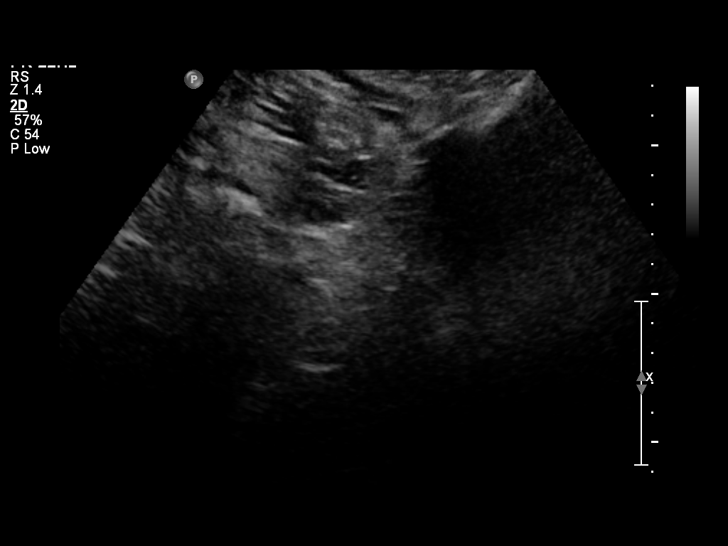
[im 10/57]
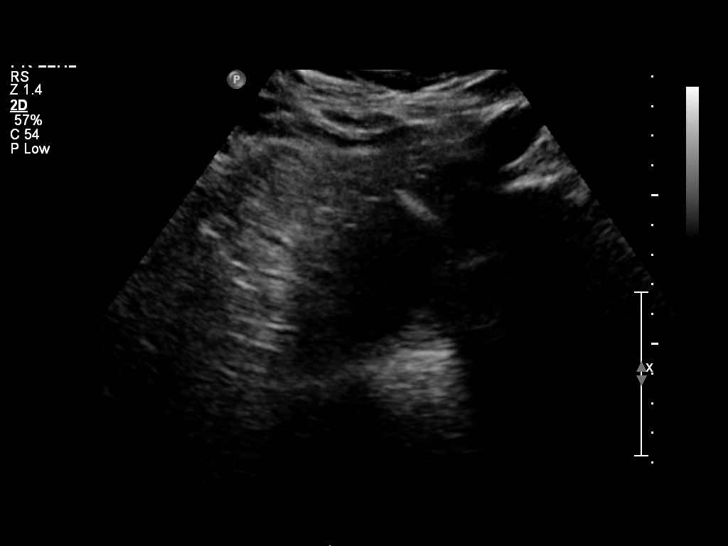
[im 15/57]
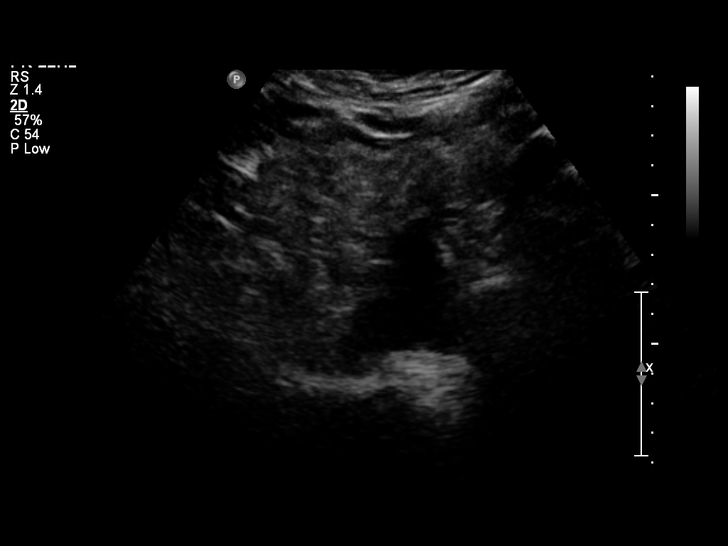
[im 19/57]
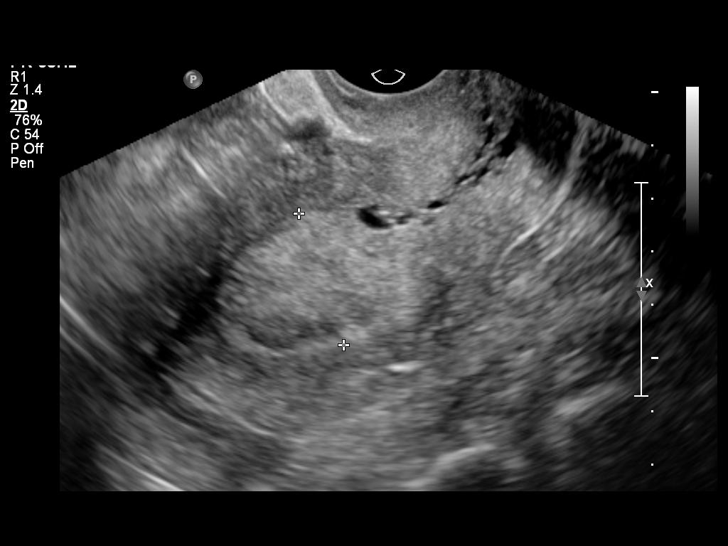
[im 24/57]
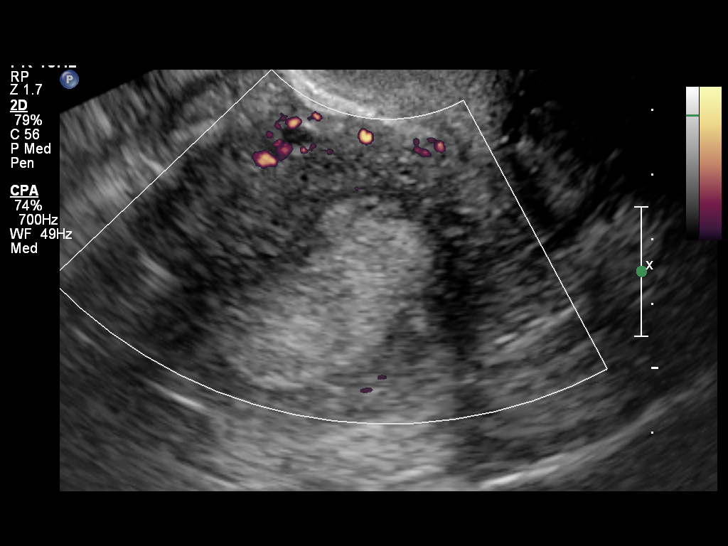
[im 29/57]
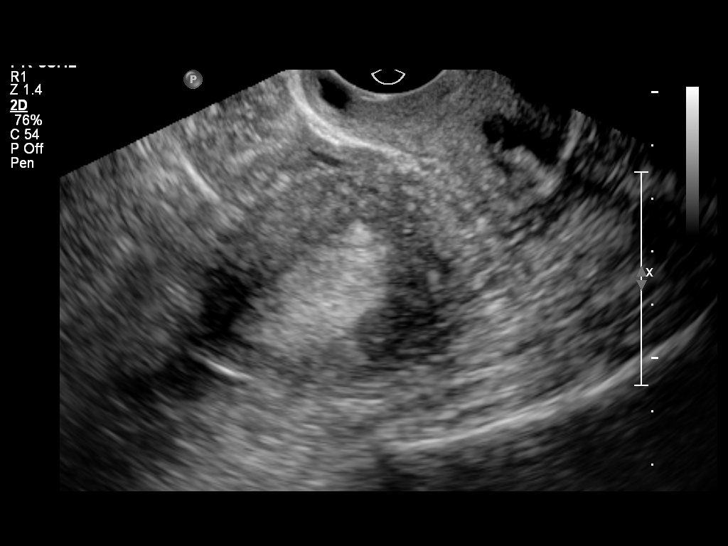
[im 33/57]
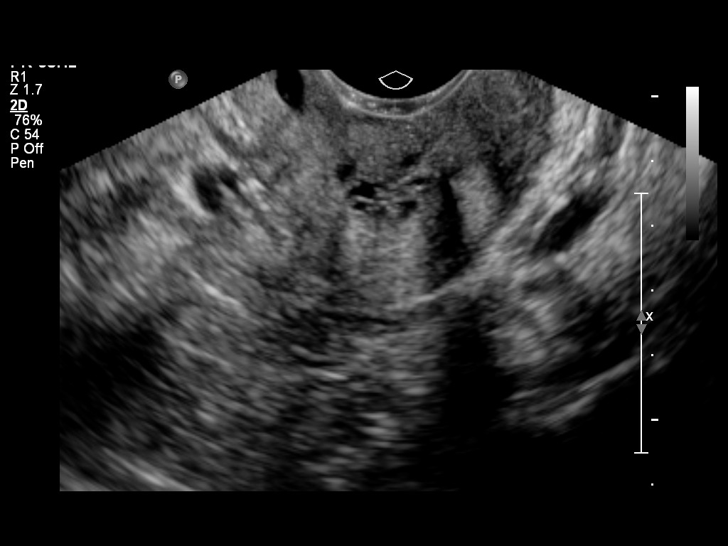
[im 38/57]
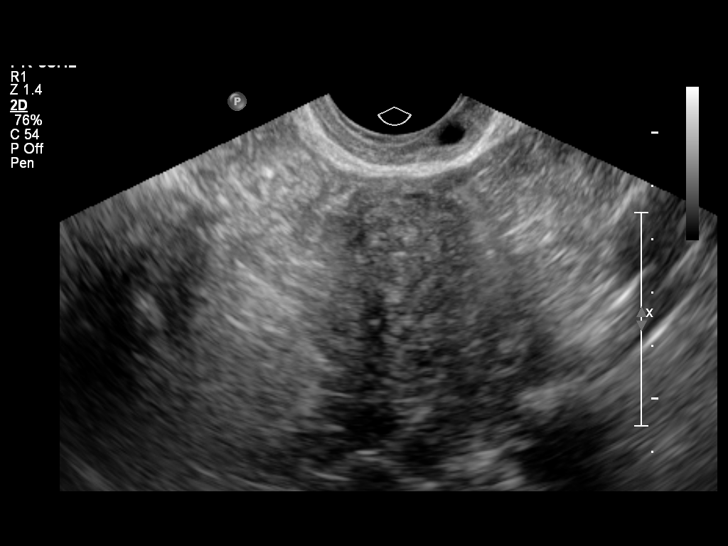
[im 43/57]
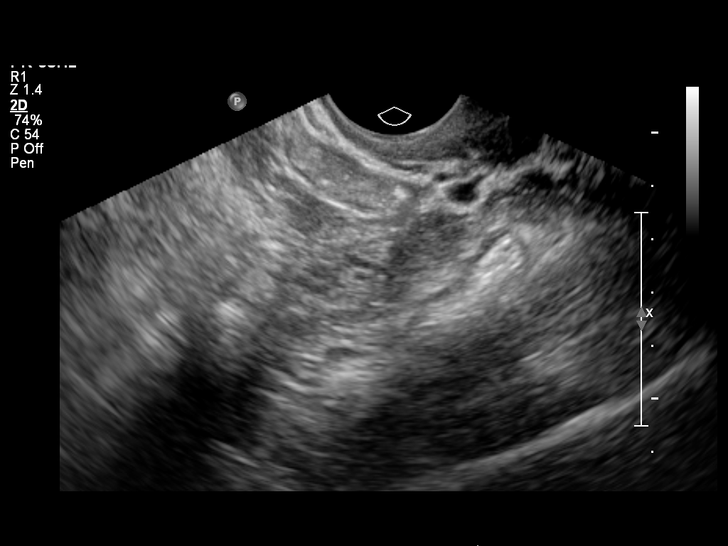
[im 47/57]
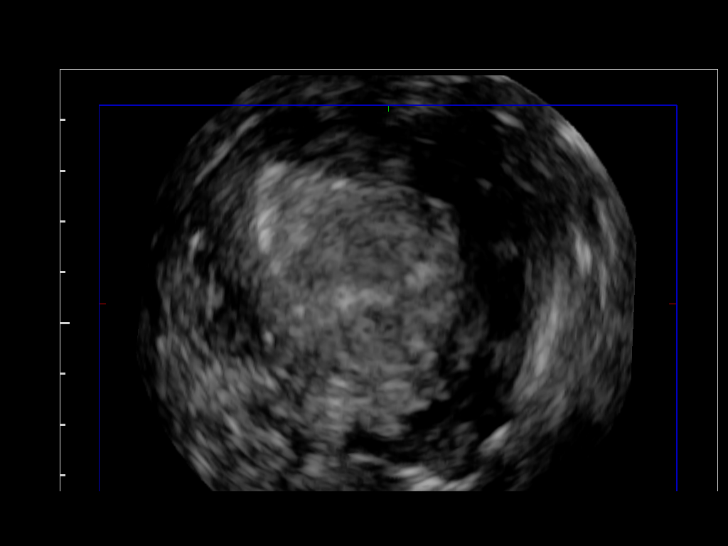
[im 52/57]
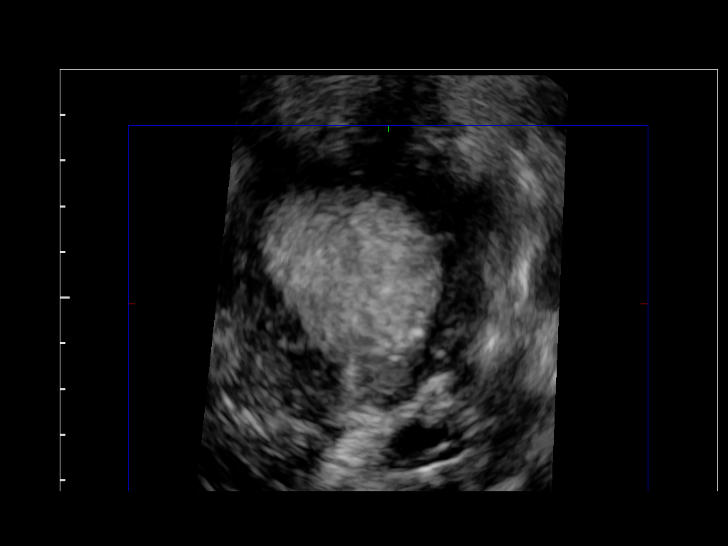
[im 57/57]
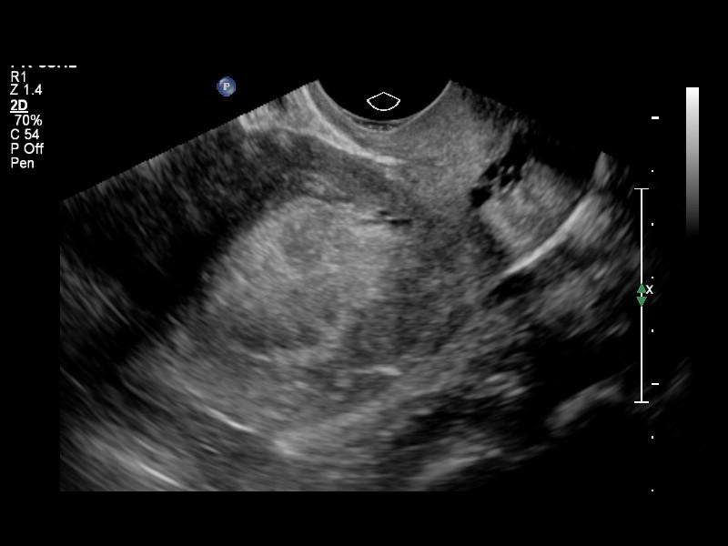

[13 of 25 positions shown; findings below may reference images not displayed]

FINDINGS: Uterus:  8.9 x 5.0 x 6.0 cm.  No fibroids identified.

Endometrium: Double layer thickness measures 26 mm.  Somewhat
heterogeneous echogenicity of the endometrium noted but no focal
lesion visualized.

Right ovary: not directly visualized by transabdominal or
transvaginal sonography, however no adnexal mass identified.

Left ovary: not directly visualized by transabdominal or
transvaginal sonography, however no adnexal mass identified.

Other Findings:  No free fluid
IMPRESSION: 1.  Abnormal endometrial thickening measuring 26 mm. In the setting
of post-menopausal bleeding, endometrial sampling is indicated to
exclude carcinoma.  If results are benign, sonohysterogram should
be considered for focal lesion work-up. (Ref:  Radiological
Reasoning: Algorithmic Workup of Abnormal Vaginal Bleeding with
Endovaginal Sonography and Sonohysterography. AJR [id number]; 191:S68-
73).
2.  Nonvisualization of ovaries, however no adnexal mass
identified.

## 2014-01-12 MED ORDER — INSULIN ASPART 100 UNIT/ML ~~LOC~~ SOLN: 0.0000 [IU] | Freq: Every day | SUBCUTANEOUS | Status: DC

## 2014-01-12 MED ORDER — FUROSEMIDE 10 MG/ML IJ SOLN
40.0000 mg | Freq: Once | INTRAMUSCULAR | Status: AC
  Administered 2014-01-12: 40 mg via INTRAVENOUS
  Filled 2014-01-12: qty 4

## 2014-01-12 MED ORDER — INSULIN ASPART 100 UNIT/ML ~~LOC~~ SOLN
0.0000 [IU] | Freq: Three times a day (TID) | SUBCUTANEOUS | Status: DC
  Administered 2014-01-12: 4 [IU] via SUBCUTANEOUS
  Administered 2014-01-13: 2 [IU] via SUBCUTANEOUS

## 2014-01-12 MED ORDER — LEVALBUTEROL HCL 0.63 MG/3ML IN NEBU: 0.6300 mg | INHALATION_SOLUTION | RESPIRATORY_TRACT | Status: DC | PRN

## 2014-01-12 NOTE — Progress Notes (Signed)
Stable day, no new issues  Ambulated 100 feet with 2 person assist  BP 118/63  Pulse 84  Temp(Src) 98.1 F (36.7 C) (Oral)  Resp 20  Ht 5\' 8"  (1.727 m)  Wt 333 lb 5.4 oz (151.2 kg)  BMI 50.70 kg/m2  SpO2 96%  LMP 10/26/2013   Intake/Output Summary (Last 24 hours) at 01/12/14 1741 Last data filed at 01/12/14 1700  Gross per 24 hour  Intake 2270.7 ml  Output    785 ml  Net 1485.7 ml   Continue present care

## 2014-01-12 NOTE — Progress Notes (Signed)
3 Days Post-Op Procedure(s) (LRB): CORONARY ARTERY BYPASS GRAFTING (CABG) x 5 using left internal mammary artery and right leg greater saphenous vein harvested endoscopically (N/A) INTRAOPERATIVE TRANSESOPHAGEAL ECHOCARDIOGRAM (N/A) Subjective: C/o feeling weak and tired Not much pain  Objective: Vital signs in last 24 hours: Temp:  [97.1 F (36.2 C)-98.9 F (37.2 C)] 97.8 F (36.6 C) (02/21 0720) Pulse Rate:  [81-99] 88 (02/21 0800) Cardiac Rhythm:  [-] Normal sinus rhythm (02/21 0600) Resp:  [0-29] 28 (02/21 0800) BP: (79-134)/(38-72) 100/51 mmHg (02/21 0800) SpO2:  [92 %-100 %] 93 % (02/21 0800) Arterial Line BP: (99-161)/(42-65) 105/44 mmHg (02/21 0800) Weight:  [333 lb 5.4 oz (151.2 kg)] 333 lb 5.4 oz (151.2 kg) (02/21 0500)  Hemodynamic parameters for last 24 hours:    Intake/Output from previous day: 02/20 0701 - 02/21 0700 In: 1627.8 [P.O.:660; I.V.:967.8] Out: 675 [Urine:675] Intake/Output this shift: Total I/O In: 30.7 [I.V.:30.7] Out: 15 [Urine:15]  General appearance: cooperative and no distress Neurologic: intact Heart: regular rate and rhythm Lungs: diminished breath sounds bibasilar Abdomen: obese, nontender  Lab Results:  Recent Labs  01/11/14 0220 01/12/14 0416  WBC 20.3* 17.4*  HGB 8.7* 7.8*  HCT 25.9* 23.4*  PLT 159 163   BMET:  Recent Labs  01/11/14 0220 01/12/14 0416  NA 135* 134*  K 4.4 4.4  CL 99 97  CO2 24 25  GLUCOSE 145* 125*  BUN 20 30*  CREATININE 1.52* 1.45*  CALCIUM 8.4 8.3*    PT/INR:  Recent Labs  01/09/14 1545  LABPROT 14.0  INR 1.10   ABG    Component Value Date/Time   PHART 7.390 01/10/2014 0946   HCO3 24.0 01/10/2014 0946   TCO2 22 01/10/2014 1634   ACIDBASEDEF 1.0 01/10/2014 0946   O2SAT 96.0 01/10/2014 0946   CBG (last 3)   Recent Labs  01/11/14 2345 01/12/14 0405 01/12/14 0718  GLUCAP 116* 126* 99    Assessment/Plan: S/P Procedure(s) (LRB): CORONARY ARTERY BYPASS GRAFTING (CABG) x 5 using  left internal mammary artery and right leg greater saphenous vein harvested endoscopically (N/A) INTRAOPERATIVE TRANSESOPHAGEAL ECHOCARDIOGRAM (N/A) - CV- has weaned off neo but still on dopamine at 4 mcg/kg/min  RESP- pulmonary hygiene  RENAL- creatinine has levelled off, lytes OK  Start diuresis after transfusion  Anemia- acute secondary to ABL- Hgb < 8, symptomatic- transfuse 1 unit  ENDO- CBG well controlled  Deconditioning- significant issue, mobilize as able   LOS: 11 days    Tanav Orsak C 01/12/2014

## 2014-01-13 ENCOUNTER — Inpatient Hospital Stay (HOSPITAL_COMMUNITY): Payer: Medicare Other

## 2014-01-13 LAB — CBC
HCT: 25.2 % — ABNORMAL LOW (ref 36.0–46.0)
Hemoglobin: 8.8 g/dL — ABNORMAL LOW (ref 12.0–15.0)
MCH: 27.8 pg (ref 26.0–34.0)
MCHC: 34.9 g/dL (ref 30.0–36.0)
MCV: 79.7 fL (ref 78.0–100.0)
Platelets: 201 10*3/uL (ref 150–400)
RBC: 3.16 MIL/uL — AB (ref 3.87–5.11)
RDW: 16.2 % — ABNORMAL HIGH (ref 11.5–15.5)
WBC: 13.1 10*3/uL — AB (ref 4.0–10.5)

## 2014-01-13 LAB — GLUCOSE, CAPILLARY
GLUCOSE-CAPILLARY: 104 mg/dL — AB (ref 70–99)
GLUCOSE-CAPILLARY: 104 mg/dL — AB (ref 70–99)
GLUCOSE-CAPILLARY: 107 mg/dL — AB (ref 70–99)
GLUCOSE-CAPILLARY: 97 mg/dL (ref 70–99)
Glucose-Capillary: 125 mg/dL — ABNORMAL HIGH (ref 70–99)
Glucose-Capillary: 88 mg/dL (ref 70–99)

## 2014-01-13 LAB — BASIC METABOLIC PANEL
BUN: 32 mg/dL — ABNORMAL HIGH (ref 6–23)
CHLORIDE: 97 meq/L (ref 96–112)
CO2: 25 meq/L (ref 19–32)
CREATININE: 1.09 mg/dL (ref 0.50–1.10)
Calcium: 8.5 mg/dL (ref 8.4–10.5)
GFR calc Af Amer: 63 mL/min — ABNORMAL LOW (ref >90)
GFR calc non Af Amer: 54 mL/min — ABNORMAL LOW (ref >90)
GLUCOSE: 115 mg/dL — AB (ref 70–99)
Potassium: 4.2 mEq/L (ref 3.7–5.3)
Sodium: 133 mEq/L — ABNORMAL LOW (ref 137–147)

## 2014-01-13 LAB — TYPE AND SCREEN
ABO/RH(D): O POS
ANTIBODY SCREEN: NEGATIVE
Unit division: 0

## 2014-01-13 MED ORDER — FUROSEMIDE 10 MG/ML IJ SOLN
40.0000 mg | Freq: Once | INTRAMUSCULAR | Status: AC
  Administered 2014-01-13: 40 mg via INTRAVENOUS
  Filled 2014-01-13: qty 4

## 2014-01-13 NOTE — Progress Notes (Signed)
4 Days Post-Op Procedure(s) (LRB): CORONARY ARTERY BYPASS GRAFTING (CABG) x 5 using left internal mammary artery and right leg greater saphenous vein harvested endoscopically (N/A) INTRAOPERATIVE TRANSESOPHAGEAL ECHOCARDIOGRAM (N/A) Subjective: C/o feeling weak and tired Pain well controlled  Objective: Vital signs in last 24 hours: Temp:  [97.8 F (36.6 C)-98.8 F (37.1 C)] 98 F (36.7 C) (02/22 0731) Pulse Rate:  [80-93] 86 (02/22 0800) Cardiac Rhythm:  [-] Normal sinus rhythm (02/22 0752) Resp:  [14-29] 19 (02/22 0800) BP: (86-157)/(45-75) 116/72 mmHg (02/22 0800) SpO2:  [90 %-99 %] 98 % (02/22 0800) Arterial Line BP: (89-152)/(39-70) 149/66 mmHg (02/21 1215) Weight:  [332 lb 8 oz (150.821 kg)] 332 lb 8 oz (150.821 kg) (02/22 0500)  Hemodynamic parameters for last 24 hours:    Intake/Output from previous day: 02/21 0701 - 02/22 0700 In: 1937.1 [P.O.:840; I.V.:747.1; Blood:350] Out: 2185 [Urine:2185] Intake/Output this shift: Total I/O In: 28 [I.V.:28] Out: 50 [Urine:50]  General appearance: alert and cooperative Neurologic: intact Heart: regular rate and rhythm Lungs: diminished breath sounds bibasilar Abdomen: normal findings: soft, non-tender  Lab Results:  Recent Labs  01/12/14 0416 01/13/14 0415  WBC 17.4* 13.1*  HGB 7.8* 8.8*  HCT 23.4* 25.2*  PLT 163 201   BMET:  Recent Labs  01/12/14 0416 01/13/14 0415  NA 134* 133*  K 4.4 4.2  CL 97 97  CO2 25 25  GLUCOSE 125* 115*  BUN 30* 32*  CREATININE 1.45* 1.09  CALCIUM 8.3* 8.5    PT/INR: No results found for this basename: LABPROT, INR,  in the last 72 hours ABG    Component Value Date/Time   PHART 7.390 01/10/2014 0946   HCO3 24.0 01/10/2014 0946   TCO2 22 01/10/2014 1634   ACIDBASEDEF 1.0 01/10/2014 0946   O2SAT 96.0 01/10/2014 0946   CBG (last 3)   Recent Labs  01/12/14 1947 01/12/14 2132 01/13/14 0729  GLUCAP 129* 104* 104*    Assessment/Plan: S/P Procedure(s) (LRB): CORONARY  ARTERY BYPASS GRAFTING (CABG) x 5 using left internal mammary artery and right leg greater saphenous vein harvested endoscopically (N/A) INTRAOPERATIVE TRANSESOPHAGEAL ECHOCARDIOGRAM (N/A) - CV- BP improved, will try to wean dopamine  RESP- pulmonary hygiene for bibasilar atelectasis  RENAL- creatinine improved, back to normal, continue diuresis  ENDO- CBG well controlled  Obesity/ deconditioning- difficult to mobilize, continue OOB and ambulation   LOS: 12 days    [name] 01/13/2014

## 2014-01-13 NOTE — Progress Notes (Signed)
No complaints this evening  BP 130/72  Pulse 100  Temp(Src) 98.1 F (36.7 C) (Oral)  Resp 30  Ht 5\' 8"  (1.727 m)  Wt 332 lb 8 oz (150.821 kg)  BMI 50.57 kg/m2  SpO2 94%  LMP 10/26/2013    Intake/Output Summary (Last 24 hours) at 01/13/14 1752 Last data filed at 01/13/14 1700  Gross per 24 hour  Intake 1560.06 ml  Output   3709 ml  Net -2148.94 ml   Diuresing well  Slowly making progress

## 2014-01-14 ENCOUNTER — Ambulatory Visit: Payer: Medicare Other | Admitting: Obstetrics & Gynecology

## 2014-01-14 LAB — BASIC METABOLIC PANEL
BUN: 27 mg/dL — ABNORMAL HIGH (ref 6–23)
CO2: 27 mEq/L (ref 19–32)
Calcium: 8.5 mg/dL (ref 8.4–10.5)
Chloride: 100 mEq/L (ref 96–112)
Creatinine, Ser: 1 mg/dL (ref 0.50–1.10)
GFR, EST AFRICAN AMERICAN: 70 mL/min — AB (ref >90)
GFR, EST NON AFRICAN AMERICAN: 60 mL/min — AB (ref >90)
Glucose, Bld: 109 mg/dL — ABNORMAL HIGH (ref 70–99)
Potassium: 4.3 mEq/L (ref 3.7–5.3)
SODIUM: 138 meq/L (ref 137–147)

## 2014-01-14 LAB — CBC
HEMATOCRIT: 24 % — AB (ref 36.0–46.0)
HEMOGLOBIN: 8.2 g/dL — AB (ref 12.0–15.0)
MCH: 27.2 pg (ref 26.0–34.0)
MCHC: 34.2 g/dL (ref 30.0–36.0)
MCV: 79.5 fL (ref 78.0–100.0)
Platelets: 256 10*3/uL (ref 150–400)
RBC: 3.02 MIL/uL — ABNORMAL LOW (ref 3.87–5.11)
RDW: 16.3 % — AB (ref 11.5–15.5)
WBC: 12 10*3/uL — ABNORMAL HIGH (ref 4.0–10.5)

## 2014-01-14 LAB — GLUCOSE, CAPILLARY
GLUCOSE-CAPILLARY: 113 mg/dL — AB (ref 70–99)
Glucose-Capillary: 108 mg/dL — ABNORMAL HIGH (ref 70–99)
Glucose-Capillary: 109 mg/dL — ABNORMAL HIGH (ref 70–99)
Glucose-Capillary: 117 mg/dL — ABNORMAL HIGH (ref 70–99)
Glucose-Capillary: 133 mg/dL — ABNORMAL HIGH (ref 70–99)
Glucose-Capillary: 98 mg/dL (ref 70–99)

## 2014-01-14 MED ORDER — SODIUM CHLORIDE 0.9 % IJ SOLN
3.0000 mL | Freq: Two times a day (BID) | INTRAMUSCULAR | Status: DC
  Administered 2014-01-17: 3 mL via INTRAVENOUS

## 2014-01-14 MED ORDER — INSULIN ASPART 100 UNIT/ML ~~LOC~~ SOLN
0.0000 [IU] | Freq: Three times a day (TID) | SUBCUTANEOUS | Status: DC
Start: 2014-01-14 — End: 2014-01-15
  Administered 2014-01-14 – 2014-01-15 (×2): 2 [IU] via SUBCUTANEOUS

## 2014-01-14 MED ORDER — SODIUM CHLORIDE 0.9 % IJ SOLN
3.0000 mL | INTRAMUSCULAR | Status: DC | PRN
  Administered 2014-01-18: 10:00:00 via INTRAVENOUS

## 2014-01-14 MED ORDER — MAGNESIUM HYDROXIDE 400 MG/5ML PO SUSP
30.0000 mL | Freq: Every day | ORAL | Status: DC | PRN
  Filled 2014-01-14: qty 30

## 2014-01-14 MED ORDER — SODIUM CHLORIDE 0.9 % IV SOLN: 250.0000 mL | INTRAVENOUS | Status: DC | PRN

## 2014-01-14 MED ORDER — SODIUM CHLORIDE 0.9 % IJ SOLN
10.0000 mL | INTRAMUSCULAR | Status: DC | PRN
  Administered 2014-01-16 – 2014-01-20 (×9): 10 mL

## 2014-01-14 MED ORDER — POTASSIUM CHLORIDE CRYS ER 10 MEQ PO TBCR
10.0000 meq | EXTENDED_RELEASE_TABLET | Freq: Every day | ORAL | Status: DC
  Administered 2014-01-14 – 2014-01-18 (×5): 10 meq via ORAL
  Filled 2014-01-14 (×6): qty 1

## 2014-01-14 MED ORDER — SIMVASTATIN 20 MG PO TABS
20.0000 mg | ORAL_TABLET | Freq: Every day | ORAL | Status: DC
  Filled 2014-01-14 (×2): qty 1

## 2014-01-14 MED ORDER — TRAMADOL HCL 50 MG PO TABS: 50.0000 mg | ORAL_TABLET | Freq: Four times a day (QID) | ORAL | Status: DC | PRN

## 2014-01-14 MED ORDER — FE FUMARATE-B12-VIT C-FA-IFC PO CAPS
1.0000 | ORAL_CAPSULE | Freq: Three times a day (TID) | ORAL | Status: DC
  Administered 2014-01-14 – 2014-01-21 (×16): 1 via ORAL
  Filled 2014-01-14 (×29): qty 1

## 2014-01-14 MED ORDER — BISACODYL 5 MG PO TBEC
10.0000 mg | DELAYED_RELEASE_TABLET | Freq: Every day | ORAL | Status: DC | PRN
  Filled 2014-01-14 (×3): qty 2

## 2014-01-14 MED ORDER — BISACODYL 10 MG RE SUPP: 10.0000 mg | Freq: Every day | RECTAL | Status: DC | PRN

## 2014-01-14 MED ORDER — MOVING RIGHT ALONG BOOK
Freq: Once | Status: AC
  Administered 2014-01-14: 13:00:00
  Filled 2014-01-14: qty 1

## 2014-01-14 MED ORDER — FUROSEMIDE 10 MG/ML IJ SOLN
40.0000 mg | Freq: Every day | INTRAMUSCULAR | Status: DC
  Administered 2014-01-14 – 2014-01-18 (×5): 40 mg via INTRAVENOUS
  Filled 2014-01-14 (×6): qty 4

## 2014-01-14 MED ORDER — METOLAZONE 5 MG PO TABS
5.0000 mg | ORAL_TABLET | Freq: Every day | ORAL | Status: AC
  Administered 2014-01-14: 5 mg via ORAL
  Filled 2014-01-14: qty 1

## 2014-01-14 NOTE — Progress Notes (Signed)
Orthopedic Tech Progress Note Patient Details:  [name] 01/29/1956 [id number] Delivered to nursing station Ortho Devices Type of Ortho Device: Abdominal binder Ortho Device/Splint Location: abdomen Ortho Device/Splint Interventions: Ordered   [place] 01/14/2014, 10:44 AM

## 2014-01-14 NOTE — Progress Notes (Signed)
UR Completed.  [name] Jane [phone] 01/14/2014  

## 2014-01-14 NOTE — Progress Notes (Signed)
5 Days Post-Op Procedure(s) (LRB): CORONARY ARTERY BYPASS GRAFTING (CABG) x 5 using left internal mammary artery and right leg greater saphenous vein harvested endoscopically (N/A) INTRAOPERATIVE TRANSESOPHAGEAL ECHOCARDIOGRAM (N/A) Sub     Had some SOB/ orthopnea last pm CXR wet Incisions clean  Objective: Vital signs in last 24 hours: Temp:  [98.1 F (36.7 C)-99.5 F (37.5 C)] 98.5 F (36.9 C) (02/23 0742) Pulse Rate:  [87-100] 94 (02/23 0700) Cardiac Rhythm:  [-] Normal sinus rhythm (02/23 0700) Resp:  [15-42] 26 (02/23 0700) BP: (91-162)/(33-100) 108/69 mmHg (02/23 0700) SpO2:  [92 %-100 %] 94 % (02/23 0732) Weight:  [328 lb 3.2 oz (148.871 kg)] 328 lb 3.2 oz (148.871 kg) (02/23 0500)  Hemodynamic parameters for last 24 hours:   nsr Intake/Output from previous day: 02/22 0701 - 02/23 0700 In: 1340 [P.O.:840; I.V.:500] Out: 2784 [Urine:2780; Stool:4] Intake/Output this shift:    Lungs diminished  Lab Results:  Recent Labs  01/13/14 0415 01/14/14 0415  WBC 13.1* 12.0*  HGB 8.8* 8.2*  HCT 25.2* 24.0*  PLT 201 256   BMET:  Recent Labs  01/13/14 0415 01/14/14 0415  NA 133* 138  K 4.2 4.3  CL 97 100  CO2 25 27  GLUCOSE 115* 109*  BUN 32* 27*  CREATININE 1.09 1.00  CALCIUM 8.5 8.5    PT/INR: No results found for this basename: LABPROT, INR,  in the last 72 hours ABG    Component Value Date/Time   PHART 7.390 01/10/2014 0946   HCO3 24.0 01/10/2014 0946   TCO2 22 01/10/2014 1634   ACIDBASEDEF 1.0 01/10/2014 0946   O2SAT 96.0 01/10/2014 0946   CBG (last 3)   Recent Labs  01/13/14 2358 01/14/14 0404 01/14/14 0740  GLUCAP 108* 98 113*    Assessment/Plan: S/P Procedure(s) (LRB): CORONARY ARTERY BYPASS GRAFTING (CABG) x 5 using left internal mammary artery and right leg greater saphenous vein harvested endoscopically (N/A) INTRAOPERATIVE TRANSESOPHAGEAL ECHOCARDIOGRAM (N/A) Plan for transfer to step-down: see transfer orders   LOS: 13 days     VAN TRIGT III,Haylin Camilli 01/14/2014

## 2014-01-14 NOTE — Progress Notes (Signed)
Peripherally Inserted Central Catheter/Midline Placement  The IV Nurse has discussed with the patient and/or persons authorized to consent for the patient, the purpose of this procedure and the potential benefits and risks involved with this procedure.  The benefits include less needle sticks, lab draws from the catheter and patient may be discharged home with the catheter.  Risks include, but not limited to, infection, bleeding, blood clot (thrombus formation), and puncture of an artery; nerve damage and irregular heat beat.  Alternatives to this procedure were also discussed.  PICC/Midline Placement Documentation        Darlyn Read 01/14/2014, 4:30 PM

## 2014-01-14 NOTE — Progress Notes (Signed)
Physical Therapy Treatment Patient Details Name: [name] MRN: [id number] DOB: 06/26/1956 Today's Date: 01/14/2014    History of Present Illness Adm 2/10 with chest pain, +MI, cardiac cath 2/12, CABG x5 2/18 PMHX includes TKR, obesity, HTN    PT Comments    Pt busy all morning and reports fatigued this afternoon, however very willing to work with PT. Pt inquiring re: getting a lift chair to assist with sit to stand at home and discouraged making this decision until she practices coming to standing more.    Follow Up Recommendations  Supervision/Assistance - 24 hour;CIR     Equipment Recommendations  None recommended by PT    Recommendations for Other Services OT consult  Precautions / Restrictions Precautions Precautions: Sternal Required Braces or Orthoses: Other Brace/Splint Other Brace/Splint: bra for breast/incision support Restrictions Weight Bearing Restrictions: No Other Position/Activity Restrictions: sternal precautions-pt unable to state    Home Living                       Prior Function              Pertinent Vitals/Pain        Sternum; 5/10; RN had just donned a new bra  Mobility  Bed Mobility                  Transfers Overall transfer level: Needs assistance Equipment used: Rolling walker (2 wheeled) Transfers: Sit to/from Stand Sit to Stand: +2 physical assistance;Min guard;From elevated surface         General transfer comment: from bariatric chair (elevated); vc for safe technique (including allowing hands on her knees as coming to/from stand)  Ambulation/Gait Ambulation/Gait assistance: Min assist Ambulation Distance (Feet): 80 Feet Assistive device: Rolling walker (2 wheeled) Gait Pattern/deviations: Step-through pattern;Decreased stride length;Wide base of support   Gait velocity interpretation: at or above normal speed for age/gender General Gait Details: attempted to follow with her bariatric chair to maximize  distance she could walk, however chair was steering very poorly and required return to room to assure ability to reach the chair. Pt reported she was exhausted after walking.                  Balance             Standing balance-Leahy Scale: Poor                      Exercises        Cognition Arousal/Alertness: Awake/alert Behavior During Therapy: WFL for tasks assessed/performed Overall Cognitive Status: Within Functional Limits for tasks assessed                      General Comments General comments (skin integrity, edema, etc.): End of session, worked on positioning in bariatric chair (reclined position) for better comfort. (Pt reports her bottom and low back were hurting from sitting in regular recliner.   PT Goals (current goals can now be found in the care plan section) Acute Rehab PT Goals Patient Stated Goal: return home with neighbor's assist PT Goal Formulation: With patient Time For Goal Achievement: 01/18/14 Potential to Achieve Goals: Good  Frequency  Min 3X/week    PT Plan Current plan remains appropriate    End of Session Equipment Utilized During Treatment: Other (comment) (bra) Activity Tolerance: Patient limited by fatigue Patient left: with call bell/phone within reach;in chair     Time: 6270-3500 PT Time  Calculation (min): 31 min  Charges:  $Gait Training: 8-22 mins $Therapeutic Activity: 8-22 mins                    G Codes:      Evin Chirco 01/29/2014, 4:31 PM Pager [phone]

## 2014-01-15 ENCOUNTER — Inpatient Hospital Stay (HOSPITAL_COMMUNITY): Payer: Medicare Other

## 2014-01-15 LAB — GLUCOSE, CAPILLARY
GLUCOSE-CAPILLARY: 120 mg/dL — AB (ref 70–99)
Glucose-Capillary: 106 mg/dL — ABNORMAL HIGH (ref 70–99)
Glucose-Capillary: 111 mg/dL — ABNORMAL HIGH (ref 70–99)
Glucose-Capillary: 111 mg/dL — ABNORMAL HIGH (ref 70–99)

## 2014-01-15 LAB — COMPREHENSIVE METABOLIC PANEL
ALT: 21 U/L (ref 0–35)
AST: 24 U/L (ref 0–37)
Albumin: 3 g/dL — ABNORMAL LOW (ref 3.5–5.2)
Alkaline Phosphatase: 82 U/L (ref 39–117)
BUN: 21 mg/dL (ref 6–23)
CO2: 28 mEq/L (ref 19–32)
Calcium: 9.1 mg/dL (ref 8.4–10.5)
Chloride: 95 mEq/L — ABNORMAL LOW (ref 96–112)
Creatinine, Ser: 0.84 mg/dL (ref 0.50–1.10)
GFR calc Af Amer: 86 mL/min — ABNORMAL LOW (ref >90)
GFR calc non Af Amer: 75 mL/min — ABNORMAL LOW (ref >90)
Glucose, Bld: 111 mg/dL — ABNORMAL HIGH (ref 70–99)
Potassium: 4.2 mEq/L (ref 3.7–5.3)
Sodium: 136 mEq/L — ABNORMAL LOW (ref 137–147)
Total Bilirubin: 0.5 mg/dL (ref 0.3–1.2)
Total Protein: 7.7 g/dL (ref 6.0–8.3)

## 2014-01-15 LAB — CBC
HCT: 25.3 % — ABNORMAL LOW (ref 36.0–46.0)
Hemoglobin: 8.8 g/dL — ABNORMAL LOW (ref 12.0–15.0)
MCH: 28 pg (ref 26.0–34.0)
MCHC: 34.8 g/dL (ref 30.0–36.0)
MCV: 80.6 fL (ref 78.0–100.0)
Platelets: 357 10*3/uL (ref 150–400)
RBC: 3.14 MIL/uL — ABNORMAL LOW (ref 3.87–5.11)
RDW: 16.9 % — ABNORMAL HIGH (ref 11.5–15.5)
WBC: 11.3 10*3/uL — ABNORMAL HIGH (ref 4.0–10.5)

## 2014-01-15 MED ORDER — TRAMADOL HCL 50 MG PO TABS
50.0000 mg | ORAL_TABLET | Freq: Four times a day (QID) | ORAL | Status: DC | PRN
  Administered 2014-01-16 – 2014-01-20 (×6): 100 mg via ORAL
  Filled 2014-01-15 (×6): qty 2

## 2014-01-15 MED ORDER — NON FORMULARY: 20.0000 mg | Freq: Every day | Status: DC

## 2014-01-15 MED ORDER — CHLORHEXIDINE GLUCONATE 0.12 % MT SOLN
15.0000 mL | Freq: Two times a day (BID) | OROMUCOSAL | Status: DC
  Administered 2014-01-15 – 2014-01-17 (×4): 15 mL via OROMUCOSAL
  Filled 2014-01-15 (×13): qty 15

## 2014-01-15 MED ORDER — METOLAZONE 5 MG PO TABS
5.0000 mg | ORAL_TABLET | Freq: Every day | ORAL | Status: AC
  Administered 2014-01-15: 5 mg via ORAL
  Filled 2014-01-15: qty 1

## 2014-01-15 MED ORDER — ROSUVASTATIN CALCIUM 20 MG PO TABS
20.0000 mg | ORAL_TABLET | Freq: Every day | ORAL | Status: DC
  Administered 2014-01-15 – 2014-01-20 (×5): 20 mg via ORAL
  Filled 2014-01-15 (×7): qty 1

## 2014-01-15 MED ORDER — METOPROLOL TARTRATE 12.5 MG HALF TABLET
12.5000 mg | ORAL_TABLET | Freq: Two times a day (BID) | ORAL | Status: DC
  Administered 2014-01-15 – 2014-01-18 (×8): 12.5 mg via ORAL
  Filled 2014-01-15 (×10): qty 1

## 2014-01-15 MED ORDER — CLINDAMYCIN HCL 300 MG PO CAPS
300.0000 mg | ORAL_CAPSULE | Freq: Four times a day (QID) | ORAL | Status: DC
Start: 2014-01-15 — End: 2014-01-18
  Administered 2014-01-15 – 2014-01-18 (×10): 300 mg via ORAL
  Filled 2014-01-15 (×14): qty 1

## 2014-01-15 NOTE — Progress Notes (Signed)
Thank you for consult on [name]. Note that she is doing better today with therapy.  HHOT recommended for follow up.Would recommend SNF for follow up therapies if she continues to be limited and does not have 24 hours supervision.

## 2014-01-15 NOTE — Progress Notes (Signed)
CARDIAC REHAB PHASE I   PRE:  Rate/Rhythm: 100 ST    BP: sitting 142/70    SaO2: 95 RA  MODE:  Ambulation: 150 ft   POST:  Rate/Rhythm: 122 ST    BP: sitting 174/82     SaO2: 97 RA  Pt much improved today. Moving fairly independently once OOB. Gave pt assist x1 to get to EOB. To BSC before walk, able to clean herself. Did not need to sit to rest with 150 ft, slow pace using RW. Assist x2 for safety/follow with chair. Pt back to bed since she had been up all morning. Needed assist x2 to assist her legs up into bed without straining.  HR and BP elevated.  [id number]  Josephina Shih Boutte CES, ACSM 01/15/2014 10:32 AM

## 2014-01-15 NOTE — Progress Notes (Signed)
Clinical Social Work Department CLINICAL SOCIAL WORK PLACEMENT NOTE 01/15/2014  Patient:  [name], [name]  Account Number:  [id number] [place] date:  01/01/2014  Clinical Social Worker:  Megan Salon  Date/time:  01/15/2014 01:26 PM  Clinical Social Work is seeking post-discharge placement for this patient at the following level of care:   [place]   (*CSW will update this form in Epic as items are completed)   01/15/2014  Patient/family provided with [facility] Department of Clinical Social Work's list of facilities offering this level of care within the geographic area requested by the patient (or if unable, by the patient's family).  01/15/2014  Patient/family informed of their freedom to choose among providers that offer the needed level of care, that participate in Medicare, Medicaid or managed care program needed by the patient, have an available bed and are willing to accept the patient.  01/15/2014  Patient/family informed of [facility]' ownership interest in [facility], as well as of the fact that they are under no obligation to receive care at this facility.  PASARR submitted to EDS on 01/14/2014 PASARR number received from EDS on 01/14/2014  FL2 transmitted to all facilities in geographic area requested by pt/family on  01/15/2014 FL2 transmitted to all facilities within larger geographic area on   Patient informed that his/her managed care company has contracts with or will negotiate with  certain facilities, including the following:     Patient/family informed of bed offers received:   Patient chooses bed at  Physician recommends and patient chooses bed at    Patient to be transferred to  on   Patient to be transferred to facility by   The following physician request were entered in Epic:   Additional Comments:  Jeanette Caprice, MSW, [place]

## 2014-01-15 NOTE — Evaluation (Signed)
Occupational Therapy Evaluation Patient Details Name: [name] MRN: [id number] DOB: [birth year] Today's Date: 01/15/2014 Time: 6761-9509 OT Time Calculation (min): 46 min  OT Assessment / Plan / Recommendation History of present illness Adm 2/10 with chest pain, +MI, cardiac cath 2/12, CABG x5 2/18 PMHX includes TKR, obesity, HTN   Clinical Impression   Patient is s/p x5 CABG surgery resulting in functional limitations due to the deficits listed below (see OT problem list).  Patient will benefit from skilled OT acutely to increase independence and safety with ADLS to allow discharge HHOT has all necessary DME at home.     OT Assessment  Patient needs continued OT Services    Follow Up Recommendations  Home health OT    Barriers to Discharge      Equipment Recommendations  None recommended by OT    Recommendations for Other Services    Frequency  Min 2X/week    Precautions / Restrictions Precautions Precautions: Sternal Required Braces or Orthoses: Other Brace/Splint Other Brace/Splint: bra for breast/incision support- wearing bra instead of abdominal binder ordered due to poor fit of abdominal binder Restrictions Weight Bearing Restrictions: Yes Other Position/Activity Restrictions: sternal precautions-pt unable to state   Pertinent Vitals/Pain 7 out 10 pain with mobility     ADL  Eating/Feeding: Supervision/safety Where Assessed - Eating/Feeding: Chair Grooming: Wash/dry face;Wash/dry hands;Teeth care;Supervision/safety Where Assessed - Grooming: Supported sitting Upper Body Bathing: Chest;Right arm;Left arm;Abdomen;Supervision/safety Where Assessed - Upper Body Bathing: Supported sitting Lower Body Bathing: Maximal assistance Where Assessed - Lower Body Bathing: Unsupported sit to stand Upper Body Dressing: Supervision/safety Where Assessed - Upper Body Dressing: Supported sitting Lower Body Dressing: Maximal assistance Where Assessed - Lower Body Dressing:  Supported sit to Lobbyist: Minimal assistance Armed forces technical officer Method: Sit to Loss adjuster, chartered: Raised toilet seat with arms (or 3-in-1 over toilet) Toileting - Clothing Manipulation and Hygiene: Minimal assistance Where Assessed - Toileting Clothing Manipulation and Hygiene: Sit to stand from 3-in-1 or toilet Transfers/Ambulation Related to ADLs: pt stand pivot min (A) chair to 3n1. Pt currently on standard 3n1. OT recommends bariatric 3n1 due to weight limits. OT requesting RN and Financial controller order. RN informed ADL Comments: Pt in bariatric chair on arrival since 4am. Pt agreeable to bath. Pt transfered to 3n1 voiding bladder. Pt bathing on 3n1 with (A). Pt could benefit from AE education for LB. pt able to reach peri care with hip flexion. Pt fatigued and demonstrates 2 out 4 dyspnea with ~4 ft ambulation to EOB. pt required (A) with bil LE for bed transfer.     OT Diagnosis: Generalized weakness;Acute pain  OT Problem List: Decreased strength;Decreased activity tolerance;Impaired balance (sitting and/or standing);Decreased safety awareness;Decreased knowledge of use of DME or AE;Decreased knowledge of precautions;Obesity OT Treatment Interventions: Self-care/ADL training;Therapeutic exercise;DME and/or AE instruction;Therapeutic activities;Patient/family education;Balance training   OT Goals(Current goals can be found in the care plan section) Acute Rehab OT Goals Patient Stated Goal: return home with neighbor's assist OT Goal Formulation: With patient Time For Goal Achievement: 01/29/14 Potential to Achieve Goals: Good  Visit Information  Last OT Received On: 01/15/14 Assistance Needed: +1 (+2 for ambulation) History of Present Illness: Adm 2/10 with chest pain, +MI, cardiac cath 2/12, CABG x5 2/18 PMHX includes TKR, obesity, HTN       Prior Functioning     Home Living Family/patient expects to be discharged to:: Private residence Living  Arrangements: Alone Available Help at Discharge: Neighbor;Available 24 hours/day Type of Home: [place]  Access: Level entry Home Layout: One level Home Equipment: Cane - single point;Walker - 2 wheels;Bedside commode;Tub bench;Other (comment);Grab bars - tub/shower Prior Function Level of Independence: Independent with assistive device(s) Comments: walked with cane PTA; sometimes walker Communication Communication: No difficulties Dominant Hand: Right         Vision/Perception Vision - History Baseline Vision: Wears glasses only for reading Patient Visual Report: No change from baseline Vision - Assessment Eye Alignment: Within Functional Limits Perception Perception: Within Functional Limits Praxis Praxis: Intact   Cognition  Cognition Arousal/Alertness: Awake/alert Behavior During Therapy: WFL for tasks assessed/performed Overall Cognitive Status: Within Functional Limits for tasks assessed    Extremity/Trunk Assessment Upper Extremity Assessment Upper Extremity Assessment: Generalized weakness (not fully assessed due to sternal precautions) Lower Extremity Assessment Lower Extremity Assessment: Defer to PT evaluation     Mobility Bed Mobility Overal bed mobility: Needs Assistance Bed Mobility: Sit to Supine Sit to supine: Mod assist;HOB elevated General bed mobility comments: pt needed (A) for BIL LE Transfers Overall transfer level: Needs assistance Equipment used: 1 person hand held assist Transfers: Sit to/from Stand Sit to Stand: Min assist;From elevated surface General transfer comment: pt transfering from elevated bariatric chair. Pt will need +2 (A) if surface is lower.     Exercise     Balance     End of Session OT - End of Session Activity Tolerance: Patient limited by fatigue Patient left: in bed;with call bell/phone within reach Nurse Communication: Mobility status;Precautions  GO     [name] 01/15/2014, 9:37 AM Pager:  [phone]

## 2014-01-15 NOTE — Progress Notes (Signed)
Clinical Social Work Department BRIEF PSYCHOSOCIAL ASSESSMENT 01/15/2014  Patient:  [name], [name]     Account Number:  [id number]     Admit date:  01/01/2014  Clinical Social Worker:  Megan Salon  Date/Time:  01/15/2014 01:04 PM  Referred by:  RN  Date Referred:  01/15/2014 Referred for  SNF Placement   Other Referral:   Interview type:  Patient Other interview type:    PSYCHOSOCIAL DATA Living Status:  ALONE Admitted from facility:   Level of care:   Primary support name:  Valaria Good Primary support relationship to patient:  CHILD, ADULT Degree of support available:   Good    CURRENT CONCERNS Current Concerns  Post-Acute Placement   Other Concerns:    SOCIAL WORK ASSESSMENT / PLAN CSW received referral that PT is recommending CIR. As a backup, CSW worked up SNF. CSW went into room and introduced self to patient and explained social work role. Patient is agreeable to being faxed out to facilities in [facility]. Patient states that she is familiar with [facility] and may want to go there. CSW will complete FL2 and put it on chart for MD signature.   Assessment/plan status:  Psychosocial Support/Ongoing Assessment of Needs Other assessment/ plan:   Information/referral to community resources:   SNF information    PATIENT'S/FAMILY'S RESPONSE TO PLAN OF CARE: Patient is agreeable to SNF placement       Jeanette Caprice, MSW, [place]

## 2014-01-15 NOTE — Discharge Summary (Signed)
Physician Discharge Summary       [address]       [place] [address]             [phone]    Patient ID: [name] MRN: [id number] DOB/AGE: May 04, 1956 58 y.o.  Admit date: 01/01/2014 Discharge date: 01/21/2014     Admission Diagnoses: 1. Multi vessel CAD 2. History of hypertension 3. History of IDA 4. History of asthma 5. History of morbid obesity 6. History of OSA 7. History of osteoarthritis 8. History of osteoporosis    Discharge Diagnoses:  1. Multi vessel CAD 2. History of hypertension 3. History of IDA 4. History of asthma 5. History of morbid obesity 6. History of OSA 7. History of osteoarthritis 8. History of osteoporosis   Consultation: Dr. Enrique Sack    Procedure (s):  1.Cardiac catheterization done by Dr. Tamala Julian on 01/05/2014: ANGIOGRAPHIC DATA: The left main coronary artery is widely patent.  The left anterior descending artery is is moderately calcified proximally. There is severe proximal to mid 90% stenosis. The LAD is transapical.  The left circumflex artery is highly diseased. There is 80-90% stenosis of two large marginal branches and high grade mid circumflex stenosis.  The right coronary artery is dominant and contains 95% stenosis distally before the origin of the PDA.  LEFT VENTRICULOGRAM: Left ventricular angiogram was done in the 30 RAO projection and revealed normal contractility with EF 60%.  IMPRESSIONS: 1. Severe 3 vessel CAD involving the LAD, Cfx, and RCA  2. Preserved LV systolic function.  2.Coronary artery bypass grafting x5 (left internal mammary artery to  LAD, saphenous vein graft to diagonal, sequential saphenous vein  graft to OM1 and OM2, saphenous vein graft to posterior  descending).2. Endoscopic harvest of right leg greater saphenous vein by Dr. Prescott Gum on 01/09/2014.    History of Presenting Illness: This is a 58 year old African American obese female presented with symptoms of unstable  angina. Her chest pain was resolved with IV heparin and IV nitroglycerin. She underwent cardiac catheterization via right radial artery which demonstrated severe three-vessel CAD with graftable target vessels. Echocardiogram showed good LV function with some left ventricular hypertrophy from her hypertension and there does not appear to be significant valve disease. Because of her severe three-vessel coronary disease, a surgical evaluation was requested for possible surgical coronary revascularization. She does not have a prior history of heart disease, arrhythmia, murmur, rheumatic fever, or symptoms of CHF. Patient has had previous left knee surgery twice as well as hemorrhoid surgery without anesthesia or surgical complication. She currently uses a cane to help walk but was able to walk 200 feet with cardiac rehabilitation today. The patient is morbidly obese at one point weight 400 pounds, current was used to 30 pounds. BMI is 50.1 Patient has history of a TIA in 3 years ago without CVA. Carotid duplex scans were ordered. Results showed a 1-39% bilateral carotid artery stenosis. Dr. Prescott Gum discussed the need for coronary artery bypass grafting surgery. Potential risks, complications, and benefits of the surgery were discussed with the patient and she agreed to proceed. She underwent a CABG x 5 on 01/09/2014.     Brief Hospital Course:  The patient was extubated the morning of post operative day one without difficulty. She remained afebrile and hemodynamically stable. A dental consult was obtained with Dr. Enrique Sack as she had a history of dental pain. It was advised that the patient wait 2-4 weeks following cardiac surgery to undergo multiple  dental extractions and alveoloplasty. Patient will schedule a follow up appointment with Dr. Enrique Sack after discharge. She was weaned off of Dopamine drip.Her [name], a line, chest tubes, and foley were removed early in the post operative course. Lopressor was  started and titrated accordingly.  She was weaned off the insulin drip. The patient's HGA1C pre op was 5.1. She did have ABL anemia. She did not require a post operative transfusion. Her last H and H was up to 9 and 26.6 She was put on Trinsicon.  She was volume overloaded and diuresed. The patient was felt surgically stable for transfer from the ICU to [facility] for further convalescence on 01/14/2023.  She continues to progress with cardiac rehab.She is ambulating on room air. She has been tolerating a diet and has had a bowel movement. Epicardial pacing wires and chest tube sutures were removed prior to discharge. She will need to have remaining sternal staples removed in about 5 days. She developed chest and stomach heaviness, a feeling as though her food was getting stuck, nausea and emesis, and constipation on Friday 2/27. She did not have abdominal pain or shortness of breath. She was given a laxative to help with a bowel movement. Nutrition evaluated her and a swallow study revealed no significant swallowing problems, but the patient has a subjective globus sensation, and difficulty chewing due to missing teeth.  It was recommended that she continue a dysphagia 3 diet with thin liquids for now.  Nausea and emesis have resolved. She is felt to require skilled nursing placement for further rehab, and is currently medically stable for discharge.     Latest Vital Signs: Blood pressure 114/77, pulse 91, temperature 98.9 F (37.2 C), temperature source Oral, resp. rate 18, height 5\' 8"  (1.727 m), weight 142.5 kg (314 lb 2.5 oz), last menstrual period 10/26/2013, SpO2 95.00%.  Physical Exam: Cardiovascular: RRR  Pulmonary: Diminished at left base;right lung mostly clear; no rales, wheezes, or rhonchi.  Abdomen: Soft, non tender, bowel sounds present.  Extremities: Mild bilateral lower extremity edema.  Wounds: Clean and dry. No erythema or signs of infection.    Discharge Condition:Stable   Recent  laboratory studies:  Lab Results  Component Value Date   WBC 12.2* 01/20/2014   HGB 8.9* 01/20/2014   HCT 27.1* 01/20/2014   MCV 82.1 01/20/2014   PLT 499* 01/20/2014   Lab Results  Component Value Date   NA 138 01/20/2014   K 4.2 01/20/2014   CL 93* 01/20/2014   CO2 32 01/20/2014   CREATININE 1.06 01/20/2014   GLUCOSE 113* 01/20/2014      Diagnostic Studies: Dg Orthopantogram  01/08/2014   CLINICAL DATA:  Right upper and lower jaw pain. Pre operative respiratory exam for coronary artery bypass grafting.  EXAM: ORTHOPANTOGRAM/PANORAMIC  COMPARISON:  None.  FINDINGS: There is slight lucency around the roots of what appears to be tooth #1. There are multiple missing teeth. The roots of the other remaining teeth appear normal. There may be a cavity in tooth 8 but that area is slightly blurred. No sinus opacification. Osseous structures of the mandible are normal.  IMPRESSION: Faint lucency around the roots of tooth #1 which could represent infection. Possible cavity in tooth #8.   Electronically Signed   By: Rozetta Nunnery M.D.   On: 01/08/2014 14:20   Dg Chest 2 View  01/15/2014   CLINICAL DATA:  Mid chest pain; status post CABG.  EXAM: CHEST  2 VIEW  COMPARISON:  Chest radiograph  performed 01/13/2014  FINDINGS: There is a persistent small left pleural effusion, with bibasilar airspace opacification, likely reflecting atelectasis. No pneumothorax is seen.  The cardiomediastinal silhouette is borderline normal in size. The patient is status post median sternotomy, with changes of prior CABG. Skin staples are seen overlying the midline chest. No acute osseous abnormalities are identified. A left PICC is seen ending within the proximal right atrium.  IMPRESSION: Persistent small left pleural effusion, with bibasilar airspace opacification likely reflecting atelectasis.   Electronically Signed   By: Garald Balding M.D.   On: 01/15/2014 04:15   Nm Myocar Multi W/spect W/wall Motion / Ef  01/03/2014   CLINICAL DATA:   58 year old female with chest pain with  EXAM: MYOCARDIAL IMAGING WITH SPECT (REST AND PHARMACOLOGIC-STRESS - 2 DAY PROTOCOL)  GATED LEFT VENTRICULAR WALL MOTION STUDY  LEFT VENTRICULAR EJECTION FRACTION  TECHNIQUE: Standard myocardial SPECT imaging was performed after resting intravenous injection of 30 mCi Tc-35m sestamibi. Subsequently, on a second day, intravenous infusion of Lexiscan was performed under the supervision of the Cardiology staff. At peak effect of the drug, 30 mCi Tc-55m sestamibi was injected intravenously and standard myocardial SPECT imaging was performed. Quantitative gated imaging was also performed to evaluate left ventricular wall motion, and estimate left ventricular ejection fraction.  COMPARISON:  None.  FINDINGS: Perfusion: There is some motion degradation on this 2 day study. 5+ breast size additionally.  There are decreased counts within the inferior lateral wall and lateral wall involving the mid and basilar segments which improved from stress to rest. This is concerning for ischemia of the lateral wall.  There is apparent fixed defect within the anterior septal wall apex with mild reversibility which may be due to motion.  Wall Motion: Motion artifact makes evaluation wall motion difficult.  Ejection Fraction: 123456 %  End-diastolic volume equals: XX123456 mL Ml  End systolic volume equals: 53 mL Ml  IMPRESSION:  1. Concern for ischemia of the mid and basilar segments of the inferolateral and lateral wall. 2. Fixed defect with mild reversibility within the anterior septal wall versus motion artifact. 3. Wall motion is difficult to assess due to motion artifact. 4. Left ventricular ejection fraction equals 52% These results will be called to the ordering clinician or representative by the Radiologist Assistant, and communication documented in the PACS Dashboard.   Electronically Sig         Discharge Orders   Future Appointments Provider Department Dept Phone   01/22/2014 10:00 AM  [facility] Nurse [facility] [place] [phone]   01/31/2014 9:20 AM Cecilie Kicks, NP [facility] [phone]   02/13/2014 1:30 PM Ivin Poot, MD [facility] [place] [phone]   Future Orders Complete By Expires   Amb Referral to Cardiac Rehabilitation  As directed      Follow-up Information   Follow up with [facility] R, NP On 01/31/2014. (Appointment time is at 9:20 am)    Specialty:  Cardiology   Contact information:   [address] [place] [address] [phone]       Follow up with Len Childs, MD On 02/13/2014. (PA/LAT CXR to be taken (at [facility] which is in the same building as Dr. Lucianne Lei Trigt's office) on 02/13/2014 at 1:30 pm;Appointment with Dr. Prescott Gum is at 1:30 pm )    Specialty:  Cardiothoracic Surgery   Contact information:   [address] [place] [address] [phone]  Discharge Medications:   Medication List    STOP taking these medications       hydrochlorothiazide 12.5 MG tablet  Commonly known as:  HYDRODIURIL     valsartan 320 MG tablet  Commonly known as:  DIOVAN      TAKE these medications       albuterol 108 (90 BASE) MCG/ACT inhaler  Commonly known as:  PROVENTIL HFA;VENTOLIN HFA  Inhale 2 puffs into the lungs every 6 (six) hours as needed. Wheezing     alum & mag hydroxide-simeth 400-400-40 MG/5ML suspension  Commonly known as:  MYLANTA DOUBLE-STRENGTH  Take 10 mLs by mouth every 6 (six) hours as needed (for cramping or bloating).     aspirin 325 MG EC tablet  Take 1 tablet (325 mg total) by mouth daily.     Calcium Citrate-Vitamin D 250-200 MG-UNIT Tabs  Commonly known as:  CITRACAL/VITAMIN D  Take 2 tablets by mouth daily.     clindamycin 300 MG capsule  Commonly known as:  CLEOCIN  Take 1 capsule (300 mg total) by mouth 4 (four) times daily. For 4 Days     cycloSPORINE 0.05 %  ophthalmic emulsion  Commonly known as:  RESTASIS  Place 1 drop into both eyes 2 (two) times daily.     diclofenac sodium 1 % Gel  Commonly known as:  VOLTAREN  Apply 2 g topically 2 (two) times daily as needed. TOPICAL TO KNEE, SHOULDER, WRIST AND ANKLE FOR PAIN     diphenhydrAMINE 25 mg capsule  Commonly known as:  BENADRYL  Take 1 capsule (25 mg total) by mouth every 4 (four) hours as needed for itching.     feeding supplement (ENSURE COMPLETE) Liqd  Take 237 mLs by mouth 3 (three) times daily with meals.     ferrous Q000111Q C-folic acid capsule  Commonly known as:  TRINSICON / FOLTRIN  Take 1 capsule by mouth 3 (three) times daily after meals.     fluconazole 100 MG tablet  Commonly known as:  DIFLUCAN  Take 1 tablet (100 mg total) by mouth daily. For 1 dose     fluticasone 27.5 MCG/SPRAY nasal spray  Commonly known as:  VERAMYST  Place 2 sprays into the nose daily.     furosemide 40 MG tablet  Commonly known as:  LASIX  Take 1 tablet (40 mg total) by mouth 2 (two) times daily.     hydrOXYzine 10 MG tablet  Commonly known as:  ATARAX/VISTARIL  Take 1 tablet (10 mg total) by mouth 3 (three) times daily as needed for itching.     Iron-Vitamin C 100-250 MG Tabs  Take 1 tablet by mouth daily.     isosorbide mononitrate 15 mg Tb24 24 hr tablet  Commonly known as:  IMDUR  Take 0.5 tablets (15 mg total) by mouth daily.     lisinopril 2.5 MG tablet  Commonly known as:  PRINIVIL,ZESTRIL  Take 1 tablet (2.5 mg total) by mouth daily.     magic mouthwash Soln  Take 5 mLs by mouth 3 (three) times daily as needed for mouth pain.     metoprolol tartrate 25 MG tablet  Commonly known as:  LOPRESSOR  Take 1 tablet (25 mg total) by mouth 2 (two) times daily.     multivitamin with minerals Tabs tablet  Take 1 tablet by mouth 3 (three) times a week.     NEXIUM 40 MG capsule  Generic drug:  esomeprazole  TAKE ONE CAPSULE BY MOUTH  EVERY DAY     potassium chloride  SA 20 MEQ tablet  Commonly known as:  K-DUR,KLOR-CON  Take 1 tablet (20 mEq total) by mouth 2 (two) times daily.     rosuvastatin 20 MG tablet  Commonly known as:  CRESTOR  Take 1 tablet (20 mg total) by mouth daily at 6 PM.     senna-docusate 8.6-50 MG per tablet  Commonly known as:  Senokot-S  Take 1 tablet by mouth daily.     SYSTANE FREE OP  Place 1 drop into both eyes daily.     traMADol 50 MG tablet  Commonly known as:  ULTRAM  Take 1-2 tablets (50-100 mg total) by mouth every 6 (six) hours as needed for moderate pain.     triamcinolone cream 0.1 %  Commonly known as:  KENALOG  Apply on affected skin 2 times a day as needed        The patient has been discharged on:   1.Beta Blocker:  Yes [ x  ]                              No   [   ]                              If No, reason:  2.Ace Inhibitor/ARB: Yes [ d  ]                                     No  [   ]                                     If No, reason: 3.Statin:   Yes [  x ]                  No  [   ]                  If No, reason:  4.Ecasa:  Yes  [ x  ]                  No   [   ]                  If No, reason:   Instructions: Diet- Dysphagia 3 diet with thin liquids Activity- Ambulate daily with assistance Restrictions- No heavy lifting, continue sternal precautions Wound care- May shower, clean incisions daily with soap and water. Please remove remaining skin staples in 5 days.    Signed: ZIMMERMAN,DONIELLE MPA-C 01/20/2014, 12:40 PM

## 2014-01-15 NOTE — Progress Notes (Addendum)
      [address]       [place] [address]             [phone]        6 Days Post-Op Procedure(s) (LRB): CORONARY ARTERY BYPASS GRAFTING (CABG) x 5 using left internal mammary artery and right leg greater saphenous vein harvested endoscopically (N/A) INTRAOPERATIVE TRANSESOPHAGEAL ECHOCARDIOGRAM (N/A)  Subjective: Patient's only complaint is hallucinations after Oxy.  Objective: Vital signs in last 24 hours: Temp:  [98.1 F (36.7 C)-99 F (37.2 C)] 99 F (37.2 C) (02/24 0525) Pulse Rate:  [83-100] 95 (02/24 0525) Cardiac Rhythm:  [-] Normal sinus rhythm (02/24 0833) Resp:  [20-28] 20 (02/24 0525) BP: (112-171)/(70-82) 120/74 mmHg (02/24 0525) SpO2:  [94 %-98 %] 95 % (02/24 0525) Weight:  [147 kg (324 lb 1.2 oz)] 147 kg (324 lb 1.2 oz) (02/24 0430)  Pre op weight 143 kg Current Weight  01/15/14 147 kg (324 lb 1.2 oz)      Intake/Output from previous day: 02/23 0701 - 02/24 0700 In: 300 [P.O.:240; I.V.:60] Out: 9147 [Urine:1775]   Physical Exam:  Cardiovascular: RRR Pulmonary: Diminished at left base;right lung mostly clear; no rales, wheezes, or rhonchi. Abdomen: Soft, non tender, bowel sounds present. Extremities: Mild bilateral lower extremity edema. Wounds: Clean and dry.  No erythema or signs of infection.  Lab Results: CBC: Recent Labs  01/14/14 0415 01/15/14 0415  WBC 12.0* 11.3*  HGB 8.2* 8.8*  HCT 24.0* 25.3*  PLT 256 357   BMET:  Recent Labs  01/14/14 0415 01/15/14 0415  NA 138 136*  K 4.3 4.2  CL 100 95*  CO2 27 28  GLUCOSE 109* 111*  BUN 27* 21  CREATININE 1.00 0.84  CALCIUM 8.5 9.1    PT/INR:  Lab Results  Component Value Date   INR 1.10 01/09/2014   INR 1.05 01/04/2014   INR 1.0 03/06/2013   ABG:  INR: Will add last result for INR, ABG once components are confirmed Will add last 4 CBG results once components are confirmed  Assessment/Plan:  1. CV - SR. Will start low dose Lopressor. 2.  Pulmonary  - CXR shows small left pleural effusion, bibasilar atelectasis, and no pneumothorax.Encourage incentive spirometer 3. Volume Overload - On Lasix IV, Zaroxolyn 5 daily. Will change Lasix to oral in am 4.  Acute blood loss anemia - H and H up to 8.8 25.3. On Trinsicon. 5. CBGs 133/109/120. Pre op HGA1C 5.1 Stop SS and accu checks. 6. Deconditioning-continue with PT/OT 7. Remove EPW in am 8. Stop Oxy and try Ultram  ZIMMERMAN,DONIELLE MPA-C 01/15/2014,9:11 AM  patient examined and medical record reviewed,agree with above note. VAN TRIGT III,PETER 01/15/2014  Patient will need SNF and she lives alone in public housing apartment. Continue long-term Lasix

## 2014-01-15 NOTE — Discharge Instructions (Signed)
Coronary Artery Bypass Grafting, Care After °Refer to this sheet in the next few weeks. These instructions provide you with information on caring for yourself after your procedure. Your health care provider may also give you more specific instructions. Your treatment has been planned according to current medical practices, but problems sometimes occur. Call your health care provider if you have any problems or questions after your procedure. °WHAT TO EXPECT AFTER THE PROCEDURE °Recovery from surgery will be different for everyone. Some people feel well after 3 or 4 weeks, while for others it takes longer. After your procedure, it is typical to have the following: °· Nausea and a lack of appetite.   °· Constipation. °· Weakness and fatigue.   °· Depression or irritability.   °· Pain or discomfort at your incision site. °HOME CARE INSTRUCTIONS °· Only take over-the-counter or prescription medicines as directed by your health care provider. Take all medicines exactly as directed. Do not stop taking medicines or start any new medicines without first checking with your health care provider.   °· Take your pulse as directed by your health care provider. °· Perform deep breathing as directed by your health care provider. If you were given a device called an incentive spirometer, use it to practice deep breathing several times a day. Support your chest with a pillow or your arms when you take deep breaths or cough. °· Keep incision areas clean, dry, and protected. Remove or change any bandages (dressings) only as directed by your health care provider. You may have skin adhesive strips over the incision areas. Do not take the strips off. They will fall off on their own. °· Check incision areas daily for any swelling, redness, or drainage. °· If incisions were made in your legs, do the following: °· Avoid crossing your legs.   °· Avoid sitting for long periods of time. Change positions every 30 minutes.   °· Elevate your legs  when you are sitting.   °· Wear compression stockings as directed by your health care provider. These stockings help keep blood clots from forming in your legs. °· Take showers once your health care provider approves. Until then, only take sponge baths. Pat incisions dry. Do not rub incisions with a washcloth or towel. Do not take tub baths or go swimming until your health care provider approves. °· Eat foods that are high in fiber, such as raw fruits and vegetables, whole grains, beans, and nuts. Meats should be lean cut. Avoid canned, processed, and fried foods. °· Drink enough fluids to keep your urine clear or pale yellow. °· Weigh yourself every day. This helps identify if you are retaining fluid that may make your heart and lungs work harder.   °· Rest and limit activity as directed by your health care provider. You may be instructed to: °· Stop any activity at once if you have chest pain, shortness of breath, irregular heartbeats, or dizziness. Get help right away if you have any of these symptoms. °· Move around frequently for short periods or take short walks as directed by your health care provider. Increase your activities gradually. You may need physical therapy or cardiac rehabilitation to help strengthen your muscles and build your endurance. °· Avoid lifting, pushing, or pulling anything heavier than 10 lb (4.5 kg) for at least 6 weeks after surgery. °· Do not drive until your health care provider approves.  °· Ask your health care provider when you may return to work and resume sexual activity. °· Follow up with your health care provider as   directed.   °SEEK MEDICAL CARE IF: °· You have swelling, redness, increasing pain, or drainage at the site of an incision.   °· You develop a fever.   °· You have swelling in your ankles or legs.   °· You have pain in your legs.   °· You have weight gain of 2 or more pounds a day. °· You are nauseous or vomit. °· You have diarrhea.  °SEEK IMMEDIATE MEDICAL CARE  IF: °· You have chest pain that goes to your jaw or arms. °· You have shortness of breath.   °· You have a fast or irregular heartbeat.   °· You notice a "clicking" in your breastbone (sternum) when you move.   °· You have numbness or weakness in your arms or legs. °· You feel dizzy or lightheaded.   °MAKE SURE YOU: °· Understand these instructions. °· Will watch your condition. °· Will get help right away if you are not doing well or get worse. °Document Released: 05/28/2005 Document Revised: 07/11/2013 Document Reviewed: 04/17/2013 °[employer]® Patient Information ©2014 [employer]. ° °

## 2014-01-16 LAB — BASIC METABOLIC PANEL
BUN: 20 mg/dL (ref 6–23)
CO2: 31 mEq/L (ref 19–32)
Calcium: 9.3 mg/dL (ref 8.4–10.5)
Chloride: 94 mEq/L — ABNORMAL LOW (ref 96–112)
Creatinine, Ser: 0.88 mg/dL (ref 0.50–1.10)
GFR calc Af Amer: 82 mL/min — ABNORMAL LOW (ref >90)
GFR calc non Af Amer: 71 mL/min — ABNORMAL LOW (ref >90)
Glucose, Bld: 121 mg/dL — ABNORMAL HIGH (ref 70–99)
Potassium: 4.1 mEq/L (ref 3.7–5.3)
Sodium: 135 mEq/L — ABNORMAL LOW (ref 137–147)

## 2014-01-16 LAB — CBC
HCT: 25.7 % — ABNORMAL LOW (ref 36.0–46.0)
Hemoglobin: 8.6 g/dL — ABNORMAL LOW (ref 12.0–15.0)
MCH: 27 pg (ref 26.0–34.0)
MCHC: 33.5 g/dL (ref 30.0–36.0)
MCV: 80.8 fL (ref 78.0–100.0)
Platelets: 390 10*3/uL (ref 150–400)
RBC: 3.18 MIL/uL — ABNORMAL LOW (ref 3.87–5.11)
RDW: 16.6 % — ABNORMAL HIGH (ref 11.5–15.5)
WBC: 12.8 10*3/uL — ABNORMAL HIGH (ref 4.0–10.5)

## 2014-01-16 LAB — GLUCOSE, CAPILLARY
Glucose-Capillary: 123 mg/dL — ABNORMAL HIGH (ref 70–99)
Glucose-Capillary: 110 mg/dL — ABNORMAL HIGH (ref 70–99)

## 2014-01-16 NOTE — Progress Notes (Signed)
Physical Therapy Treatment Patient Details Name: [name] MRN: [id number] DOB: Jan 14, 1956 Today's Date: 01/16/2014 Time: 1205-1224 PT Time Calculation (min): 19 min  PT Assessment / Plan / Recommendation  History of Present Illness Adm 2/10 with chest pain, +MI, cardiac cath 2/12, CABG x5 2/18 PMHX includes TKR, obesity, HTN   PT Comments   Pt with improved ambulation tolerance however con't to have onset of fatigue and SOB. Pt relies on RW for safe ambulation. Pt does not feel she can ambulate without it. Pt to con't to benefit from rehab prior to d/c home to achieve safe mod I function for safe transition home.   Follow Up Recommendations  Supervision/Assistance - 24 hour;CIR     Does the patient have the potential to tolerate intense rehabilitation     Barriers to Discharge        Equipment Recommendations  Rolling walker with 5" wheels    Recommendations for Other Services    Frequency Min 3X/week   Progress towards PT Goals Progress towards PT goals: Progressing toward goals  Plan Current plan remains appropriate    Precautions / Restrictions Precautions Precautions: Sternal Other Brace/Splint: bra for breast/incision support- wearing bra instead of abdominal binder ordered due to poor fit of abdominal binder Restrictions Weight Bearing Restrictions: Yes Other Position/Activity Restrictions: sternal precautions-pt unable to state   Pertinent Vitals/Pain Mild chest incision discomfort    Mobility  Transfers Overall transfer level: Modified independent Transfers: Sit to/from Stand Sit to Stand: Supervision (from bariatric chair) Ambulation/Gait Ambulation/Gait assistance: Min guard Ambulation Distance (Feet): 150 Feet Assistive device: Rolling walker (2 wheeled) Gait Pattern/deviations: Step-through pattern General Gait Details: onset of SOB however manageable    Exercises     PT Diagnosis:    PT Problem List:   PT Treatment Interventions:     PT Goals  (current goals can now be found in the care plan section)    Visit Information  Last PT Received On: 01/16/14 Assistance Needed: +1 History of Present Illness: Adm 2/10 with chest pain, +MI, cardiac cath 2/12, CABG x5 2/18 PMHX includes TKR, obesity, HTN    Subjective Data      Cognition  Cognition Arousal/Alertness: Awake/alert Behavior During Therapy: WFL for tasks assessed/performed Overall Cognitive Status: Within Functional Limits for tasks assessed    Balance     End of Session PT - End of Session Activity Tolerance: Patient limited by fatigue Patient left: in chair;with call bell/phone within reach Nurse Communication: Mobility status   GP     Kingsley Callander 01/16/2014, 3:27 PM   Kittie Plater, PT, DPT Pager #: [phone] Office #: [phone]

## 2014-01-16 NOTE — Progress Notes (Addendum)
       [address]       [place] [address]             [phone]          7 Days Post-Op Procedure(s) (LRB): CORONARY ARTERY BYPASS GRAFTING (CABG) x 5 using left internal mammary artery and right leg greater saphenous vein harvested endoscopically (N/A) INTRAOPERATIVE TRANSESOPHAGEAL ECHOCARDIOGRAM (N/A)  Subjective: Feels much better today.  Less sore and more energy.   Objective: Vital signs in last 24 hours: Patient Vitals for the past 24 hrs:  BP Temp Temp src Pulse Resp SpO2 Weight  01/16/14 0415 129/63 mmHg 98.2 F (36.8 C) Oral 89 17 99 % 320 lb 8.8 oz (145.4 kg)  01/15/14 2018 111/74 mmHg 98.7 F (37.1 C) Oral 99 18 99 % -  01/15/14 1523 139/69 mmHg 98.5 F (36.9 C) Oral 99 18 100 % -   Current Weight  01/16/14 320 lb 8.8 oz (145.4 kg)  PRE-OPERATIVE WEIGHT: 143 kg    Intake/Output from previous day: 02/24 0701 - 02/25 0700 In: 1080 [P.O.:1080] Out: 950 [Urine:950]  CBGs 111-111-121-123    PHYSICAL EXAM:  Heart: RRR Lungs: Not taking very deep breaths, but overall clear Wound: Clean and dry Extremities: +LE edema    Lab Results: CBC: Recent Labs  01/15/14 0415 01/16/14 0340  WBC 11.3* 12.8*  HGB 8.8* 8.6*  HCT 25.3* 25.7*  PLT 357 390   BMET:  Recent Labs  01/15/14 0415 01/16/14 0340  NA 136* 135*  K 4.2 4.1  CL 95* 94*  CO2 28 31  GLUCOSE 111* 121*  BUN 21 20  CREATININE 0.84 0.88  CALCIUM 9.1 9.3    PT/INR: No results found for this basename: LABPROT, INR,  in the last 72 hours    Assessment/Plan: S/P Procedure(s) (LRB): CORONARY ARTERY BYPASS GRAFTING (CABG) x 5 using left internal mammary artery and right leg greater saphenous vein harvested endoscopically (N/A) INTRAOPERATIVE TRANSESOPHAGEAL ECHOCARDIOGRAM (N/A)  CV- SR, BPs controlled.  Continue current meds.  Vol overload- diurese, back on po Lasix, Zaroxylyn.  Deconditioning- PT/OT/CRPI.  Elevated CBGs- stable (A1C=5.1).  Disp- will  need SNF at discharge- bed search in progress.   LOS: 15 days    COLLINS,GINA H 01/16/2014  patient examined and medical record reviewed,agree with above note Plan for SNF transfer 1-2 days. VAN TRIGT III,PETER 01/16/2014

## 2014-01-16 NOTE — Progress Notes (Signed)
CARDIAC REHAB PHASE I   PRE:  Rate/Rhythm: 97 SR  BP:  Supine:   Sitting: 140/73  Standing:    SaO2: 99 RA  MODE:  Ambulation: 350 ft   POST:  Rate/Rhythm: 124 ST  BP:  Supine:   Sitting: 168/72  Standing:    SaO2: 95 RA 0910-0950 Assisted X 1 and used walker to ambulate. Gait steady with walker. Pt able to walk 350 feet without c/o other than weakness. BP elevated after walk. Pt back to recliner after walk with call light in reach.  Rodney Langton RN 01/16/2014 9:47 AM

## 2014-01-17 ENCOUNTER — Inpatient Hospital Stay (HOSPITAL_COMMUNITY): Payer: Medicare Other

## 2014-01-17 LAB — CBC
HCT: 26.6 % — ABNORMAL LOW (ref 36.0–46.0)
Hemoglobin: 9 g/dL — ABNORMAL LOW (ref 12.0–15.0)
MCH: 27.4 pg (ref 26.0–34.0)
MCHC: 33.8 g/dL (ref 30.0–36.0)
MCV: 80.9 fL (ref 78.0–100.0)
Platelets: 438 10*3/uL — ABNORMAL HIGH (ref 150–400)
RBC: 3.29 MIL/uL — ABNORMAL LOW (ref 3.87–5.11)
RDW: 16.8 % — ABNORMAL HIGH (ref 11.5–15.5)
WBC: 11.6 10*3/uL — ABNORMAL HIGH (ref 4.0–10.5)

## 2014-01-17 LAB — BASIC METABOLIC PANEL
BUN: 23 mg/dL (ref 6–23)
CO2: 29 mEq/L (ref 19–32)
Calcium: 9.4 mg/dL (ref 8.4–10.5)
Chloride: 92 mEq/L — ABNORMAL LOW (ref 96–112)
Creatinine, Ser: 0.94 mg/dL (ref 0.50–1.10)
GFR calc Af Amer: 75 mL/min — ABNORMAL LOW (ref >90)
GFR calc non Af Amer: 65 mL/min — ABNORMAL LOW (ref >90)
Glucose, Bld: 132 mg/dL — ABNORMAL HIGH (ref 70–99)
Potassium: 3.8 mEq/L (ref 3.7–5.3)
Sodium: 136 mEq/L — ABNORMAL LOW (ref 137–147)

## 2014-01-17 MED ORDER — MAGIC MOUTHWASH
5.0000 mL | Freq: Three times a day (TID) | ORAL | Status: DC
  Administered 2014-01-17 – 2014-01-21 (×13): 5 mL via ORAL
  Filled 2014-01-17 (×15): qty 5

## 2014-01-17 MED ORDER — FLUCONAZOLE 100 MG PO TABS
100.0000 mg | ORAL_TABLET | Freq: Every day | ORAL | Status: AC
  Administered 2014-01-17 – 2014-01-19 (×3): 100 mg via ORAL
  Filled 2014-01-17 (×3): qty 1

## 2014-01-17 NOTE — Progress Notes (Signed)
Occupational Therapy Treatment Patient Details Name: [name] MRN: [id number] DOB: 1956/11/17 Today's Date: 01/17/2014 Time: 6712-4580 OT Time Calculation (min): 40 min  OT Assessment / Plan / Recommendation  History of present illness Adm 2/10 with chest pain, +MI, cardiac cath 2/12, CABG x5 2/18 PMHX includes TKR, obesity, HTN   OT comments  Pt educated in use of AE for LB ADL and energy conservation.  Continues to be limited by decreased activity tolerance.  Performing transfers at a supervision level from elevated surfaces.  Able to state and generalize sternal precautions this visit.  Follow Up Recommendations  SNF    Barriers to Discharge       Equipment Recommendations  None recommended by OT    Recommendations for Other Services    Frequency Min 2X/week   Progress towards OT Goals Progress towards OT goals: Progressing toward goals  Plan Discharge plan needs to be updated    Precautions / Restrictions Precautions Precautions: Sternal Other Brace/Splint: bra for breast/incision support- wearing bra instead of abdominal binder ordered due to poor fit of abdominal binder Restrictions Weight Bearing Restrictions: Yes Other Position/Activity Restrictions: pt able to state and adhere to sternal precautions   Pertinent Vitals/Pain Sternal pain, pt notified RN, repositioned, HR at 100 at rest    ADL  Grooming: Wash/dry hands;Supervision/safety Where Assessed - Grooming: Unsupported standing Lower Body Bathing: Maximal assistance Lower Body Dressing: Maximal assistance Toilet Transfer Method: Sit to stand Toilet Transfer Equipment: Extra wide bedside commode Toileting - Clothing Manipulation and Hygiene: Supervision/safety Where Assessed - Toileting Clothing Manipulation and Hygiene: Sit to stand from 3-in-1 or toilet Equipment Used: Sock aid;Rolling walker;Reacher;Long-handled sponge Transfers/Ambulation Related to ADLs: supervision in room without device ADL  Comments: Pt educated in availability and use of AE and instructed in energy conservation.    OT Diagnosis:    OT Problem List:   OT Treatment Interventions:     OT Goals(current goals can now be found in the care plan section) Acute Rehab OT Goals Patient Stated Goal: pt now with plans for 2 weeks of rehab at SNF  Visit Information  Last OT Received On: 01/17/14 Assistance Needed: +1 History of Present Illness: Adm 2/10 with chest pain, +MI, cardiac cath 2/12, CABG x5 2/18 PMHX includes TKR, obesity, HTN    Subjective Data      Prior Functioning       Cognition  Cognition Arousal/Alertness: Awake/alert Behavior During Therapy: WFL for tasks assessed/performed Overall Cognitive Status: Within Functional Limits for tasks assessed    Mobility  Bed Mobility Overal bed mobility: Modified Independent Bed Mobility: Sit to Supine Sit to supine: Modified independent (Device/Increase time);HOB elevated Transfers Transfers: Sit to/from Stand Sit to Stand: Supervision General transfer comment: pt transfering from elevated bariatric chair. Pt will need +2 (A) if surface is lower.    Exercises      Balance    End of Session OT - End of Session Activity Tolerance: Patient limited by fatigue Patient left: in bed;with call bell/phone within reach  GO     Malka So 01/17/2014, 10:52 AM [phone]

## 2014-01-17 NOTE — Progress Notes (Addendum)
EPW pulled per protocol. Ends intact. Pt informed of bedrest for one hour. Verbalized understanding. VSS. CT sutures removed per order.  Benzoin and 1/2" steri strips applied.  Pt informed to leave strips on until strips fall off. Will continue to monitor pt closely.

## 2014-01-17 NOTE — Progress Notes (Addendum)
       [address]       [place] [address]             [phone]          8 Days Post-Op Procedure(s) (LRB): CORONARY ARTERY BYPASS GRAFTING (CABG) x 5 using left internal mammary artery and right leg greater saphenous vein harvested endoscopically (N/A) INTRAOPERATIVE TRANSESOPHAGEAL ECHOCARDIOGRAM (N/A)  Subjective: Feels well overall, main complaint is mouth soreness -"I think I've got thrush."   Objective: Vital signs in last 24 hours: Patient Vitals for the past 24 hrs:  BP Temp Temp src Pulse Resp SpO2 Weight  01/17/14 0419 123/79 mmHg 98.1 F (36.7 C) Oral 91 18 96 % 319 lb 3.6 oz (144.8 kg)  01/16/14 2022 119/79 mmHg 99 F (37.2 C) Oral 94 18 94 % -   Current Weight  01/17/14 319 lb 3.6 oz (144.8 kg)  PRE-OPERATIVE WEIGHT: 143 kg    Intake/Output from previous day: 02/25 0701 - 02/26 0700 In: 720 [P.O.:720] Out: 1290 [Urine:1290]  CBGs 110-132    PHYSICAL EXAM:  Heart: RRR Lungs: Clear, slightly decreased BS in bases Wound: Clean and dry Extremities: +LE edema    Lab Results: CBC: Recent Labs  01/16/14 0340 01/17/14 0455  WBC 12.8* 11.6*  HGB 8.6* 9.0*  HCT 25.7* 26.6*  PLT 390 438*   BMET:  Recent Labs  01/16/14 0340 01/17/14 0455  NA 135* 136*  K 4.1 3.8  CL 94* 92*  CO2 31 29  GLUCOSE 121* 132*  BUN 20 23  CREATININE 0.88 0.94  CALCIUM 9.3 9.4    PT/INR: No results found for this basename: LABPROT, INR,  in the last 72 hours  CXR: FINDINGS:  Prior median sternotomy. Left-sided PICC line is unchanged in  position with tip at mid right atrium.  Lateral view degraded by patient positioning.  Midline trachea. Cardiomegaly accentuated by AP portable technique.  No pleural fluid. No pneumothorax. Left greater than right bibasilar  atelectasis. Low lung volumes with resultant pulmonary interstitial  prominence.  IMPRESSION:  Persistent bibasilar atelectasis, without acute finding.  Resolution of  bilateral pleural effusions.    Assessment/Plan: S/P Procedure(s) (LRB): CORONARY ARTERY BYPASS GRAFTING (CABG) x 5 using left internal mammary artery and right leg greater saphenous vein harvested endoscopically (N/A) INTRAOPERATIVE TRANSESOPHAGEAL ECHOCARDIOGRAM (N/A) CV- SR, BPs controlled. Continue current meds.  Vol overload- diurese, continue po Lasix, Zaroxylyn.  Deconditioning- PT/OT/CRPI.  Elevated CBGs- stable (A1C=5.1).  Likely oral candidiasis while on abx- will Rx Magic Mouthwash. Disp- awaiting SNF.     LOS: 16 days    COLLINS,GINA H 01/17/2014  Remove everyother skin staple- leave remaining for removal later in office Ready for SNF tomorrow  patient examined and medical record reviewed,agree with above note. VAN TRIGT III,Axel Meas 01/17/2014

## 2014-01-17 NOTE — Progress Notes (Signed)
CARDIAC REHAB PHASE I   PRE:  Rate/Rhythm: 107 st  BP:  Sitting: 122/72     SaO2: 94 ra  MODE:  Ambulation: 350 ft   POST:  Rate/Rhythm: 130 st  BP:  Sitting: 160/74     SaO2: 94 ra  9:05am-10:00am  Patient walked assisted x1 with a walker.  Patient ambulated slow but steady and needed to stop once for a break.  Patient stated that she felt good but a little weak.  She states she has not been able to eat very much because her mouth has been hurting her.  She feels the weakness is due to not being able to eat.  Patient was educated and seems very eager to start cardiac rehab and "start her new life".    Vita Erm, [place] 01/17/2014 9:53 AM

## 2014-01-18 MED ORDER — TRAMADOL HCL 50 MG PO TABS: 50.0000 mg | ORAL_TABLET | Freq: Four times a day (QID) | ORAL | Status: DC | PRN

## 2014-01-18 MED ORDER — MAGIC MOUTHWASH: 5.0000 mL | Freq: Three times a day (TID) | ORAL | Status: DC | PRN

## 2014-01-18 MED ORDER — POTASSIUM CHLORIDE CRYS ER 10 MEQ PO TBCR: 10.0000 meq | EXTENDED_RELEASE_TABLET | Freq: Every day | ORAL | Status: DC

## 2014-01-18 MED ORDER — ROSUVASTATIN CALCIUM 20 MG PO TABS: 20.0000 mg | ORAL_TABLET | Freq: Every day | ORAL | Status: DC

## 2014-01-18 MED ORDER — FUROSEMIDE 40 MG PO TABS: 40.0000 mg | ORAL_TABLET | Freq: Every day | ORAL | Status: DC

## 2014-01-18 MED ORDER — FLUCONAZOLE 100 MG PO TABS: 100.0000 mg | ORAL_TABLET | Freq: Every day | ORAL | Status: DC

## 2014-01-18 MED ORDER — METOPROLOL TARTRATE 25 MG PO TABS: 25.0000 mg | ORAL_TABLET | Freq: Two times a day (BID) | ORAL | Status: DC

## 2014-01-18 MED ORDER — FUROSEMIDE 40 MG PO TABS
40.0000 mg | ORAL_TABLET | Freq: Every day | ORAL | Status: DC
  Filled 2014-01-18: qty 1

## 2014-01-18 MED ORDER — ISOSORBIDE MONONITRATE 15 MG HALF TABLET
15.0000 mg | ORAL_TABLET | Freq: Every day | ORAL | Status: DC
  Administered 2014-01-18 – 2014-01-21 (×4): 15 mg via ORAL
  Filled 2014-01-18 (×4): qty 1

## 2014-01-18 MED ORDER — METOCLOPRAMIDE HCL 10 MG PO TABS
10.0000 mg | ORAL_TABLET | Freq: Three times a day (TID) | ORAL | Status: DC
  Administered 2014-01-19 – 2014-01-21 (×8): 10 mg via ORAL
  Filled 2014-01-18 (×11): qty 1

## 2014-01-18 MED ORDER — ASPIRIN 325 MG PO TBEC: 325.0000 mg | DELAYED_RELEASE_TABLET | Freq: Every day | ORAL | Status: DC

## 2014-01-18 MED ORDER — SIMETHICONE 80 MG PO CHEW
80.0000 mg | CHEWABLE_TABLET | Freq: Once | ORAL | Status: AC
  Administered 2014-01-18: 80 mg via ORAL
  Filled 2014-01-18: qty 1

## 2014-01-18 MED ORDER — METOCLOPRAMIDE HCL 5 MG/ML IJ SOLN
10.0000 mg | Freq: Once | INTRAMUSCULAR | Status: AC
  Administered 2014-01-18: 10 mg via INTRAVENOUS
  Filled 2014-01-18 (×2): qty 2

## 2014-01-18 MED ORDER — PANTOPRAZOLE SODIUM 40 MG PO TBEC
40.0000 mg | DELAYED_RELEASE_TABLET | Freq: Every day | ORAL | Status: DC
  Administered 2014-01-19 – 2014-01-21 (×3): 40 mg via ORAL
  Filled 2014-01-18 (×5): qty 1

## 2014-01-18 MED ORDER — CLINDAMYCIN HCL 300 MG PO CAPS
300.0000 mg | ORAL_CAPSULE | Freq: Four times a day (QID) | ORAL | Status: AC
  Administered 2014-01-18 – 2014-01-19 (×5): 300 mg via ORAL
  Filled 2014-01-18 (×6): qty 1

## 2014-01-18 MED ORDER — CLINDAMYCIN HCL 300 MG PO CAPS: 300.0000 mg | ORAL_CAPSULE | Freq: Four times a day (QID) | ORAL | Status: DC

## 2014-01-18 MED ORDER — ALUM & MAG HYDROXIDE-SIMETH 200-200-20 MG/5ML PO SUSP
30.0000 mL | Freq: Three times a day (TID) | ORAL | Status: DC | PRN
  Administered 2014-01-18: 30 mL via ORAL
  Filled 2014-01-18: qty 30

## 2014-01-18 MED ORDER — FE FUMARATE-B12-VIT C-FA-IFC PO CAPS: 1.0000 | ORAL_CAPSULE | Freq: Three times a day (TID) | ORAL | Status: DC

## 2014-01-18 NOTE — Progress Notes (Signed)
Physical Therapy Treatment Patient Details Name: [name] MRN: [id number] DOB: October 13, 1956 Today's Date: 01/18/2014 Time: 0814-4818 PT Time Calculation (min): 15 min  PT Assessment / Plan / Recommendation  History of Present Illness Adm 2/10 with chest pain, +MI, cardiac cath 2/12, CABG x5 2/18 PMHX includes TKR, obesity, HTN   PT Comments   Patient making gains with mobility.  Per chart, patient was to go to SNF today, but held due to chest pain with vomiting this am.   Follow Up Recommendations  Supervision/Assistance - 24 hour;CIR (Per chart, patient going to SNF)     Does the patient have the potential to tolerate intense rehabilitation     Barriers to Discharge        Equipment Recommendations  Rolling walker with 5" wheels    Recommendations for Other Services    Frequency Min 3X/week   Progress towards PT Goals Progress towards PT goals: Progressing toward goals  Plan Current plan remains appropriate    Precautions / Restrictions Precautions Precautions: Sternal Other Brace/Splint: bra for breast/incision support- wearing bra instead of abdominal binder ordered due to poor fit of abdominal binder Restrictions Weight Bearing Restrictions: No Other Position/Activity Restrictions: sternal precautions   Pertinent Vitals/Pain     Mobility  Bed Mobility Overal bed mobility: Modified Independent Bed Mobility: Supine to Sit;Sit to Supine Supine to sit: Modified independent (Device/Increase time) Sit to supine: Modified independent (Device/Increase time) General bed mobility comments: Patient uses bed rail and able to move supine <> sit. Transfers Overall transfer level: Needs assistance Equipment used: None Transfers: Sit to/from Stand Sit to Stand: Supervision General transfer comment: Supervision for safety Ambulation/Gait Ambulation/Gait assistance: Min guard Ambulation Distance (Feet): 150 Feet Assistive device: Rolling walker (2 wheeled) Gait  Pattern/deviations: Step-through pattern;Decreased stride length;Wide base of support Gait velocity: Slow Gait velocity interpretation: Below normal speed for age/gender General Gait Details: Patient able to safely use RW.  Fatigues quickly, requiring 1 standing rest break.    Exercises     PT Diagnosis:    PT Problem List:   PT Treatment Interventions:     PT Goals (current goals can now be found in the care plan section)    Visit Information  Last PT Received On: 01/18/14 Assistance Needed: +1 History of Present Illness: Adm 2/10 with chest pain, +MI, cardiac cath 2/12, CABG x5 2/18 PMHX includes TKR, obesity, HTN    Subjective Data  Subjective: My chest feels a little tight today - RN notified   Cognition  Cognition Arousal/Alertness: Awake/alert Behavior During Therapy: WFL for tasks assessed/performed Overall Cognitive Status: Within Functional Limits for tasks assessed    Balance     End of Session PT - End of Session Activity Tolerance: Patient limited by fatigue Patient left: in bed;with call bell/phone within reach Nurse Communication: Mobility status   GP     Despina Pole 01/18/2014, 7:39 PM Carita Pian. Sanjuana Kava, [place] Pager [phone]

## 2014-01-18 NOTE — Progress Notes (Signed)
CARDIAC REHAB PHASE I   PRE:  Rate/Rhythm: 85 SR  BP:  Supine:   Sitting:   Standing:    SaO2: 97 RA  MODE:  Ambulation:  ft   POST:  Rate/Rhythm:   BP:  Supine:   Sitting:  Standing:    SaO2:  1143-1220  On arrival pt in bed c/o of feeling nauseated. Pt spitting up phlegm. States that she can not swallow  food, I can chew it just can not swallow. Suddenly pt vomited a large amount of phlegm and undigested food down front of her gown. Clean pt up and got her to recliner. Pt sitting in recliner attempting to eat, saying that she can not swallow. Pt has cal light in each. Reported to RN. Pt declines ambulation, states she walked about 4am.  Rodney Langton RN 01/18/2014 12:20 PM

## 2014-01-18 NOTE — Clinical Social Work Note (Signed)
Patient was medically stable for discharge to [facility] for short-term rehab today and discharge paperwork transmitted to facility. CSW informed by nurse at approx. 2:09 pm that patient having nausea and vomiting and some chest pain. MD contacted and cancelled discharge for today. CSW contacted admissions director Janie at [facility] to advise, and when asked she indicated that patient cannot admit over the weekend and she will advise CSW regarding bed availability for patient on Monday. CSW contacted bedside nurse and advised that patient cannot d/c to facility over weekend.  Generoso Cropper Givens, MSW, LCSW [phone]

## 2014-01-18 NOTE — Progress Notes (Addendum)
      [address]       [place] [address]             [phone]      9 Days Post-Op Procedure(s) (LRB): CORONARY ARTERY BYPASS GRAFTING (CABG) x 5 using left internal mammary artery and right leg greater saphenous vein harvested endoscopically (N/A) INTRAOPERATIVE TRANSESOPHAGEAL ECHOCARDIOGRAM (N/A)  Subjective:  [name] states she is doing okay.  She states however the breakfast is too heavy.  She now states she would like to go home instead of being placed in SNF.  However, she does not have 24 hour a day care.  She is ambulating with assistance.  + BM  Objective: Vital signs in last 24 hours: Temp:  [98 F (36.7 C)-98.1 F (36.7 C)] 98.1 F (36.7 C) (02/27 0517) Pulse Rate:  [92-96] 96 (02/27 0517) Cardiac Rhythm:  [-] Normal sinus rhythm;Sinus tachycardia (02/26 2239) Resp:  [18] 18 (02/27 0517) BP: (117-131)/(58-63) 131/63 mmHg (02/27 0517) SpO2:  [96 %-97 %] 97 % (02/27 0517) Weight:  [317 lb 14.5 oz (144.2 kg)] 317 lb 14.5 oz (144.2 kg) (02/27 0517)  Intake/Output from previous day: 02/26 0701 - 02/27 0700 In: 480 [P.O.:480] Out: 650 [Urine:650]  General appearance: alert, cooperative and no distress Heart: regular rate and rhythm Lungs: clear to auscultation bilaterally Abdomen: soft, non-tender; bowel sounds normal; no masses,  no organomegaly Extremities: edema trace Wound: clean and dry  Lab Results:  Recent Labs  01/16/14 0340 01/17/14 0455  WBC 12.8* 11.6*  HGB 8.6* 9.0*  HCT 25.7* 26.6*  PLT 390 438*   BMET:  Recent Labs  01/16/14 0340 01/17/14 0455  NA 135* 136*  K 4.1 3.8  CL 94* 92*  CO2 31 29  GLUCOSE 121* 132*  BUN 20 23  CREATININE 0.88 0.94  CALCIUM 9.3 9.4    PT/INR: No results found for this basename: LABPROT, INR,  in the last 72 hours ABG    Component Value Date/Time   PHART 7.390 01/10/2014 0946   HCO3 24.0 01/10/2014 0946   TCO2 22 01/10/2014 1634   ACIDBASEDEF 1.0 01/10/2014 0946   O2SAT 96.0  01/10/2014 0946   CBG (last 3)   Recent Labs  01/15/14 2048 01/16/14 0615 01/16/14 2057  GLUCAP 111* 123* 110*    Assessment/Plan: S/P Procedure(s) (LRB): CORONARY ARTERY BYPASS GRAFTING (CABG) x 5 using left internal mammary artery and right leg greater saphenous vein harvested endoscopically (N/A) INTRAOPERATIVE TRANSESOPHAGEAL ECHOCARDIOGRAM (N/A)  1. CV- NSR rate in the 90s, SBP 130s- will increase Lopressor to 25 mg BID 2. Pulm- no acute issues, some atelectasis on last CXR, good use of IS 3. Renal- weight is below baseline, on Lasix 4. Thrush- continue magic mouthwash, Diflucan 5. Deconditioning- needs bed at SNF 6. Dispo- patient medically stable, will d/c to SNF today if bed available   LOS: 17 days    Ellwood Handler 01/18/2014   SNF form signed She needs further care in SNF- discharge home alone would not be safe patient examined and medical record reviewed,agree with above note. VAN TRIGT III,PETER 01/18/2014

## 2014-01-18 NOTE — Progress Notes (Signed)
Called report to Kenyon at [facility]. Patient has had N/V, reflux and trouble swallowing. I have given Maalox, and Zofran as ordered. I have notified Ellwood Handler, PA regarding recent episode of N/V. Will continue to monitor patient's status. Glade Nurse, RN

## 2014-01-19 MED ORDER — ENSURE COMPLETE PO LIQD
237.0000 mL | Freq: Three times a day (TID) | ORAL | Status: DC
  Administered 2014-01-19 – 2014-01-21 (×5): 237 mL via ORAL

## 2014-01-19 MED ORDER — LACTULOSE 10 GM/15ML PO SOLN
20.0000 g | Freq: Once | ORAL | Status: AC
  Administered 2014-01-19: 20 g via ORAL
  Filled 2014-01-19: qty 30

## 2014-01-19 MED ORDER — POTASSIUM CHLORIDE 20 MEQ/15ML (10%) PO LIQD
20.0000 meq | Freq: Two times a day (BID) | ORAL | Status: DC
  Administered 2014-01-19: 20 meq via ORAL
  Filled 2014-01-19 (×3): qty 15

## 2014-01-19 MED ORDER — FUROSEMIDE 40 MG PO TABS
40.0000 mg | ORAL_TABLET | Freq: Two times a day (BID) | ORAL | Status: DC
  Administered 2014-01-19 – 2014-01-21 (×5): 40 mg via ORAL
  Filled 2014-01-19 (×7): qty 1

## 2014-01-19 MED ORDER — LISINOPRIL 5 MG PO TABS
5.0000 mg | ORAL_TABLET | Freq: Every day | ORAL | Status: DC
  Filled 2014-01-19: qty 1

## 2014-01-19 MED ORDER — METOPROLOL TARTRATE 25 MG PO TABS
25.0000 mg | ORAL_TABLET | Freq: Two times a day (BID) | ORAL | Status: DC
  Administered 2014-01-19 – 2014-01-21 (×5): 25 mg via ORAL
  Filled 2014-01-19 (×6): qty 1

## 2014-01-19 MED ORDER — POTASSIUM CHLORIDE CRYS ER 20 MEQ PO TBCR
20.0000 meq | EXTENDED_RELEASE_TABLET | Freq: Two times a day (BID) | ORAL | Status: DC
  Administered 2014-01-19: 20 meq via ORAL
  Filled 2014-01-19: qty 1

## 2014-01-19 MED ORDER — LISINOPRIL 2.5 MG PO TABS
2.5000 mg | ORAL_TABLET | Freq: Every day | ORAL | Status: DC
  Administered 2014-01-21: 2.5 mg via ORAL
  Filled 2014-01-19 (×3): qty 1

## 2014-01-19 NOTE — Progress Notes (Addendum)
      [address]       [place] [address]             [phone]        10 Days Post-Op Procedure(s) (LRB): CORONARY ARTERY BYPASS GRAFTING (CABG) x 5 using left internal mammary artery and right leg greater saphenous vein harvested endoscopically (N/A) INTRAOPERATIVE TRANSESOPHAGEAL ECHOCARDIOGRAM (N/A)  Subjective: Patient had nausea and emesis yesterday afternoon. She describes a feeling of "heaviness in her chest and stomach". She feels like food might get stuck. She has no nausea this am. She denies chest pain, shortness of breath, diaphoresis. She is requesting a laxative this morning as well.  Objective: Vital signs in last 24 hours: Temp:  [97.8 F (36.6 C)-99.1 F (37.3 C)] 98.4 F (36.9 C) (02/28 0644) Pulse Rate:  [89-107] 107 (02/28 0644) Cardiac Rhythm:  [-] Normal sinus rhythm;Sinus tachycardia (02/27 2034) Resp:  [18-19] 19 (02/28 0644) BP: (114-182)/(64-85) 182/85 mmHg (02/28 0644) SpO2:  [92 %-98 %] 98 % (02/28 0644) Weight:  [144.5 kg (318 lb 9 oz)] 144.5 kg (318 lb 9 oz) (02/28 0644)  Pre op weight 143 kg Current Weight  01/19/14 144.5 kg (318 lb 9 oz)      Intake/Output from previous day: 02/27 0701 - 02/28 0700 In: 160 [P.O.:160] Out: 750 [Urine:750]   Physical Exam:  Cardiovascular: RRR Pulmonary: Diminished at bases; no rales, wheezes, or rhonchi. Abdomen: Soft, non tender, bowel sounds present. Extremities: Mild bilateral lower extremity edema. Wounds: Clean and dry.  No erythema or signs of infection.  Lab Results: CBC:  Recent Labs  01/17/14 0455  WBC 11.6*  HGB 9.0*  HCT 26.6*  PLT 438*   BMET:   Recent Labs  01/17/14 0455  NA 136*  K 3.8  CL 92*  CO2 29  GLUCOSE 132*  BUN 23  CREATININE 0.94  CALCIUM 9.4    PT/INR:  Lab Results  Component Value Date   INR 1.10 01/09/2014   INR 1.05 01/04/2014   INR 1.0 03/06/2013   ABG:  INR: Will add last result for INR, ABG once components are  confirmed Will add last 4 CBG results once components are confirmed  Assessment/Plan:  1. CV - SR. On Lopressor 12.5 bid and Imdur 15 daily. Will increase Lopressor to 25 bid for better HR and BP control. Start ACE as well. 2.  Pulmonary - CXR shows small left pleural effusion, bibasilar atelectasis, and no pneumothorax.Encourage incentive spirometer 3. Volume Overload - On Lasix 40 daily 4.  Acute blood loss anemia - H and H up to 9 26.6. On Trinsicon. 5. Deconditioning-continue with PT/OT 6.Thrush-continue magic mouthwash, last dose Diflucan 7. Check labs in am 8.Previous nausea and emesis, and feeling of fullness, appear to be GI and not cardiac related. Monitor closely 9. LOC constipation  ZIMMERMAN,DONIELLE MPA-C 01/19/2014,8:44 AM  Agree with above.

## 2014-01-19 NOTE — Progress Notes (Signed)
INITIAL NUTRITION ASSESSMENT  DOCUMENTATION CODES Per approved criteria  -Morbid Obesity   INTERVENTION:  1.  Swallowing evaluation  2. Ensure Complete po TID, each supplement provides 350 kcal and 13 grams of protein  3. Reviewed soft protein choices with pt.   NUTRITION DIAGNOSIS: Inadequate oral intake related to poor appetite and new trouble swallowing as evidenced by poor intake at meals.   Goal: Pt to meet >/= 90% of their estimated nutrition needs   Monitor:  PO intake, weight trend, labs, I&O  Reason for Assessment: MD Consult   58 y.o. female  Admitting Dx: Coronary atherosclerosis of native coronary artery  ASSESSMENT: Pt post op CABG x 5. Pt having N/V and poor intake.  Per pt she has had no appetite, she then had thrush and everything she tried to eat burned her mouth, this has now improved. She now states that for the last 2 days she has felt a pressure in her throat and has been unable to eat very much due to this. Today she has had some yogurt and applesauce. Pt likes ensure and is willing to drink them. Worked with RN to get swallow evaluation ordered.  No signs of wasting, pt morbidly obese. Pt reports no prior weight changes.   Height: Ht Readings from Last 1 Encounters:  01/07/14 5\' 8"  (1.727 m)    Weight: Wt Readings from Last 1 Encounters:  01/19/14 318 lb 9 oz (144.5 kg)    Ideal Body Weight: 63.6 kg   % Ideal Body Weight: 227%  Wt Readings from Last 10 Encounters:  01/19/14 318 lb 9 oz (144.5 kg)  01/19/14 318 lb 9 oz (144.5 kg)  01/19/14 318 lb 9 oz (144.5 kg)  01/19/14 318 lb 9 oz (144.5 kg)  12/13/13 317 lb 1.6 oz (143.836 kg)  11/06/13 320 lb 9.6 oz (145.423 kg)  10/15/13 318 lb (144.244 kg)  09/25/13 320 lb 11.2 oz (145.469 kg)  09/19/13 316 lb (143.337 kg)  09/06/13 320 lb 3.2 oz (145.242 kg)    Usual Body Weight: 318 lb   % Usual Body Weight: 100%  BMI:  Body mass index is 48.45 kg/(m^2).  Estimated Nutritional  Needs: Kcal: 1800-2000 Protein: 80-90 grams Fluid: > 1.8 L/day  Skin: chest incision, right leg incision  Diet Order:   CHO Modified/Heart Healthy Meal Completion: 0-50%  EDUCATION NEEDS: -No education needs identified at this time   Intake/Output Summary (Last 24 hours) at 01/19/14 1053 Last data filed at 01/19/14 1013  Gross per 24 hour  Intake    160 ml  Output    300 ml  Net   -140 ml    Last BM: 2/26   Labs:   Recent Labs Lab 01/15/14 0415 01/16/14 0340 01/17/14 0455  NA 136* 135* 136*  K 4.2 4.1 3.8  CL 95* 94* 92*  CO2 28 31 29   BUN 21 20 23   CREATININE 0.84 0.88 0.94  CALCIUM 9.1 9.3 9.4  GLUCOSE 111* 121* 132*    CBG (last 3)   Recent Labs  01/16/14 2057  GLUCAP 110*    Scheduled Meds: . aspirin EC  325 mg Oral Daily   Or  . aspirin  324 mg Per Tube Daily  . bisacodyl  10 mg Oral Daily   Or  . bisacodyl  10 mg Rectal Daily  . chlorhexidine  15 mL Mouth/Throat BID  . clindamycin  300 mg Oral QID  . cycloSPORINE  1 drop Both Eyes BID  .  docusate sodium  200 mg Oral Daily  . enoxaparin (LOVENOX) injection  40 mg Subcutaneous QHS  . ferrous [id number] C-folic acid  1 capsule Oral TID PC  . furosemide  40 mg Oral BID  . isosorbide mononitrate  15 mg Oral Daily  . lisinopril  2.5 mg Oral Daily  . magic mouthwash  5 mL Oral TID  . metoCLOPramide  10 mg Oral TID AC  . metoprolol tartrate  25 mg Oral BID  . pantoprazole  40 mg Oral Daily  . potassium chloride  20 mEq Oral BID  . rosuvastatin  20 mg Oral q1800  . sodium chloride  3 mL Intravenous Q12H    Continuous Infusions: . sodium chloride 20 mL/hr (01/09/14 1530)  . sodium chloride 20 mL/hr at 01/14/14 0700  . sodium chloride      Past Medical History  Diagnosis Date  . Seasonal allergies   . Asthma     History of  . Hypertension   . Iron deficiency 12-07-2011  . Anemia   . GERD (gastroesophageal reflux disease)   . Heart murmur   . Osteoporosis   . PONV  (postoperative nausea and vomiting)   . Dysrhythmia     occ palpitations   . Sleep apnea     no CPAP machine , borderine   . Headache(784.0)     occasional   . Osteoarthritis     osteoarthritis   . Morbid obesity   . Acute urinary retention s/p Foley 01/07/2012  . Hemorrhoids, internal, with bleeding & prolapse 12/13/2011  . Myocardial infarction     unsure when; per cardiologist report  . Chronic headaches   . Coronary artery disease   . Anginal pain     Past Surgical History  Procedure Laterality Date  . Knee surgery      Both knees  . Total knee arthroplasty  04/29/2011    Left knee  . Multiple tooth extractions    . Wisdom tooth extraction    . Colonoscopy  2009  . Knee arthroscopy  1991  . Transanal hemorrhoidal dearterliaization  01/06/12    with external hemorrhoid removal  . Coronary artery bypass graft N/A 01/09/2014    Procedure: CORONARY ARTERY BYPASS GRAFTING (CABG) x 5 using left internal mammary artery and right leg greater saphenous vein harvested endoscopically;  Surgeon: Ivin Poot, MD;  Location: [facility];  Service: Open Heart Surgery;  Laterality: N/A;  please use bed extenders and breast binder  . Intraoperative transesophageal echocardiogram N/A 01/09/2014    Procedure: INTRAOPERATIVE TRANSESOPHAGEAL ECHOCARDIOGRAM;  Surgeon: Ivin Poot, MD;  Location: [facility];  Service: Open Heart Surgery;  Laterality: N/A;    Maylon Peppers RD, [place], [place] Pager [phone] After Hours Pager

## 2014-01-19 NOTE — Progress Notes (Signed)
Pt ambulated in hall about 200 ft with front wheeled walker with no complaints of pain or shortness of breath.  Pt returned to chair, call light in reach.    Nolon Nations, RN

## 2014-01-19 NOTE — Progress Notes (Signed)
CARDIAC REHAB PHASE I   PRE:  Rate/Rhythm: 104 sinus tach  BP:  Supine:   Sitting: 165/82 automatic cyuff  Standing:    SaO2: 97% ra  MODE:  Ambulation: 300 ft   POST:  Rate/Rhythem: 109 sinus tach  BP:  Supine:   Sitting: 175/70 automatic 154/80 manual  Standing:    SaO2: 98% ra  Pt ambulated in hallway x2 assist with rollaltor.  Slow steady gait.  Asymptomatic.  Pt took 2 standing rest breaks.  Monitor tech reported pt had vtach 10 beats at 1355.  RN aware.  Pt back to chair with call light in reach. Family at bedside.    Davan Nawabi, [place]

## 2014-01-20 ENCOUNTER — Inpatient Hospital Stay (HOSPITAL_COMMUNITY): Payer: Medicare Other

## 2014-01-20 LAB — BASIC METABOLIC PANEL
BUN: 15 mg/dL (ref 6–23)
CHLORIDE: 93 meq/L — AB (ref 96–112)
CO2: 32 meq/L (ref 19–32)
Calcium: 9.6 mg/dL (ref 8.4–10.5)
Creatinine, Ser: 1.06 mg/dL (ref 0.50–1.10)
GFR calc Af Amer: 65 mL/min — ABNORMAL LOW (ref >90)
GFR calc non Af Amer: 56 mL/min — ABNORMAL LOW (ref >90)
Glucose, Bld: 113 mg/dL — ABNORMAL HIGH (ref 70–99)
POTASSIUM: 4.2 meq/L (ref 3.7–5.3)
SODIUM: 138 meq/L (ref 137–147)

## 2014-01-20 LAB — CBC
HEMATOCRIT: 27.1 % — AB (ref 36.0–46.0)
Hemoglobin: 8.9 g/dL — ABNORMAL LOW (ref 12.0–15.0)
MCH: 27 pg (ref 26.0–34.0)
MCHC: 32.8 g/dL (ref 30.0–36.0)
MCV: 82.1 fL (ref 78.0–100.0)
Platelets: 499 10*3/uL — ABNORMAL HIGH (ref 150–400)
RBC: 3.3 MIL/uL — AB (ref 3.87–5.11)
RDW: 17.1 % — ABNORMAL HIGH (ref 11.5–15.5)
WBC: 12.2 10*3/uL — ABNORMAL HIGH (ref 4.0–10.5)

## 2014-01-20 MED ORDER — POTASSIUM CHLORIDE CRYS ER 10 MEQ PO TBCR
20.0000 meq | EXTENDED_RELEASE_TABLET | Freq: Two times a day (BID) | ORAL | Status: DC
  Administered 2014-01-20 – 2014-01-21 (×3): 20 meq via ORAL
  Filled 2014-01-20 (×4): qty 2

## 2014-01-20 MED ORDER — POTASSIUM CHLORIDE CRYS ER 20 MEQ PO TBCR
20.0000 meq | EXTENDED_RELEASE_TABLET | Freq: Two times a day (BID) | ORAL | Status: DC
  Filled 2014-01-20 (×2): qty 2

## 2014-01-20 NOTE — Progress Notes (Addendum)
      [address]       [place] [address]             [phone]        11 Days Post-Op Procedure(s) (LRB): CORONARY ARTERY BYPASS GRAFTING (CABG) x 5 using left internal mammary artery and right leg greater saphenous vein harvested endoscopically (N/A) INTRAOPERATIVE TRANSESOPHAGEAL ECHOCARDIOGRAM (N/A)  Subjective: Patient denies further nausea or emesis. She described a feeling of "heaviness in her chest and stomach" yesterday and states she has this a little less today.Passing flatus and feels as though will have a bowel movement this morning.  She denies abdominal pain, chest pain.  Objective: Vital signs in last 24 hours: Temp:  [98.8 F (37.1 C)-98.9 F (37.2 C)] 98.9 F (37.2 C) (03/01 0532) Pulse Rate:  [91-100] 91 (03/01 0532) Cardiac Rhythm:  [-] Normal sinus rhythm;Sinus tachycardia (02/28 2000) Resp:  [18-20] 18 (03/01 0532) BP: (114-162)/(74-77) 114/77 mmHg (03/01 0532) SpO2:  [95 %-98 %] 95 % (03/01 0532) Weight:  [142.5 kg (314 lb 2.5 oz)] 142.5 kg (314 lb 2.5 oz) (03/01 0501)  Pre op weight 143 kg Current Weight  01/20/14 142.5 kg (314 lb 2.5 oz)      Intake/Output from previous day: 02/28 0701 - 03/01 0700 In: 630 [P.O.:620; I.V.:10] Out: 1475 [Urine:1475]   Physical Exam:  Cardiovascular: RRR Pulmonary: Diminished at bases; no rales, wheezes, or rhonchi. Abdomen: Soft, non tender, bowel sounds present. Extremities: Mild bilateral lower extremity edema. Wounds: Clean and dry.  No erythema or signs of infection.  Lab Results: CBC:  Recent Labs  01/20/14 0510  WBC 12.2*  HGB 8.9*  HCT 27.1*  PLT 499*   BMET:   Recent Labs  01/20/14 0510  NA 138  K 4.2  CL 93*  CO2 32  GLUCOSE 113*  BUN 15  CREATININE 1.06  CALCIUM 9.6    PT/INR:  Lab Results  Component Value Date   INR 1.10 01/09/2014   INR 1.05 01/04/2014   INR 1.0 03/06/2013   ABG:  INR: Will add last result for INR, ABG once components are  confirmed Will add last 4 CBG results once components are confirmed  Assessment/Plan:  1. CV - SR. On Lopressor 25 bid, Lisinopril 2.5 daily, and Imdur 15 daily. 2.  Pulmonary - .Encourage incentive spirometer 3. Volume Overload - On Lasix 40 daily 4.  Acute blood loss anemia - H and H 8.9 and 27.1On Trinsicon. 5. Deconditioning-continue with PT/OT 6.Thrush-continue magic mouthwash 7.Previous nausea and emesis resolved.Feeling of fullness, appear to be GI and not cardiac related. Await swallow study. 8. Possible to SNF soon  ZIMMERMAN,DONIELLE MPA-C 01/20/2014,8:19 AM    Chart reviewed, patient examined, agree with above. She feels better, eating and swallowing better. Oral thrush is getting better. It is possible that she could have some candida esophagitis.

## 2014-01-20 NOTE — Progress Notes (Signed)
Patient ambulated in the hallway 200 feet with RW. Independent set up and tolerated well. Will continue to monitor closely. [place] Nurse, RN

## 2014-01-20 NOTE — Progress Notes (Signed)
Removed every other staple from midsternal incision per MD order per hospital policy. Patient tolerated well. Will continue to monitor. [place] Nurse, RN

## 2014-01-21 MED ORDER — POTASSIUM CHLORIDE CRYS ER 20 MEQ PO TBCR: 20.0000 meq | EXTENDED_RELEASE_TABLET | Freq: Two times a day (BID) | ORAL | Status: DC

## 2014-01-21 MED ORDER — ENSURE COMPLETE PO LIQD: 237.0000 mL | Freq: Three times a day (TID) | ORAL | Status: DC

## 2014-01-21 MED ORDER — FUROSEMIDE 40 MG PO TABS: 40.0000 mg | ORAL_TABLET | Freq: Two times a day (BID) | ORAL | Status: DC

## 2014-01-21 MED ORDER — LISINOPRIL 2.5 MG PO TABS: 2.5000 mg | ORAL_TABLET | Freq: Every day | ORAL | Status: DC

## 2014-01-21 MED ORDER — ISOSORBIDE MONONITRATE 15 MG HALF TABLET: 15.0000 mg | ORAL_TABLET | Freq: Every day | ORAL | Status: DC

## 2014-01-21 NOTE — Progress Notes (Signed)
Clinical Social Work Department CLINICAL SOCIAL WORK PLACEMENT NOTE 01/21/2014  Patient:  [name], [name]  Account Number:  [id number] Admit date:  01/01/2014  Clinical Social Worker:  Megan Salon  Date/time:  01/15/2014 01:26 PM  Clinical Social Work is seeking post-discharge placement for this patient at the following level of care:   [place]   (*CSW will update this form in Epic as items are completed)   01/15/2014  Patient/family provided with [facility] Department of Clinical Social Work's list of facilities offering this level of care within the geographic area requested by the patient (or if unable, by the patient's family).  01/15/2014  Patient/family informed of their freedom to choose among providers that offer the needed level of care, that participate in Medicare, Medicaid or managed care program needed by the patient, have an available bed and are willing to accept the patient.  01/15/2014  Patient/family informed of [facility]' ownership interest in [facility], as well as of the fact that they are under no obligation to receive care at this facility.  PASARR submitted to EDS on 01/14/2014 PASARR number received from EDS on 01/14/2014  FL2 transmitted to all facilities in geographic area requested by pt/family on  01/15/2014 FL2 transmitted to all facilities within larger geographic area on   Patient informed that his/her managed care company has contracts with or will negotiate with  certain facilities, including the following:     Patient/family informed of bed offers received:  01/18/2014 Patient chooses bed at [facility] Physician recommends and patient chooses bed at    Patient to be transferred to [facility] on  01/21/2014 Patient to be transferred to facility by EMS  The following physician request were entered in Epic:   Additional Comments:  Jeanette Caprice, MSW,  [place]

## 2014-01-21 NOTE — Progress Notes (Signed)
Pt ambulated in hallway independently with front wheel walker; pt tolerated well; pt walked about 300 ft.  Carollee Sires, RN

## 2014-01-21 NOTE — Progress Notes (Addendum)
Clinical Social Worker facilitated patient discharge by contacting the patient and facility, [facility]. Patient agreeable to this plan and arranging transport via EMS . CSW will sign off, as social work intervention is no longer needed.  CSW arranged for 2pm pickup.   Jeanette Caprice, MSW, [place]

## 2014-01-21 NOTE — Progress Notes (Signed)
       [address]       [place] [address]             [phone]          12 Days Post-Op Procedure(s) (LRB): CORONARY ARTERY BYPASS GRAFTING (CABG) x 5 using left internal mammary artery and right leg greater saphenous vein harvested endoscopically (N/A) INTRAOPERATIVE TRANSESOPHAGEAL ECHOCARDIOGRAM (N/A)  Subjective: No nausea today.  Still no BM, but passing flatus.  Just back from walking.   Objective: Vital signs in last 24 hours: Patient Vitals for the past 24 hrs:  BP Temp Temp src Pulse Resp SpO2 Weight  01/21/14 0552 116/59 mmHg 98.5 F (36.9 C) Oral 89 18 96 % 315 lb 11.2 oz (143.2 kg)  01/20/14 2022 116/59 mmHg 98.1 F (36.7 C) Oral 86 18 94 % -  01/20/14 1432 129/73 mmHg 97.9 F (36.6 C) Oral 93 17 99 % -   Current Weight  01/21/14 315 lb 11.2 oz (143.2 kg)     Intake/Output from previous day: 03/01 0701 - 03/02 0700 In: 840 [P.O.:840] Out: 1200 [Urine:1200]    PHYSICAL EXAM:  Heart: RRR Lungs: Clear Wound: Clean and dry Extremities: + LE edema, but improving    Lab Results: CBC: Recent Labs  01/20/14 0510  WBC 12.2*  HGB 8.9*  HCT 27.1*  PLT 499*   BMET:  Recent Labs  01/20/14 0510  NA 138  K 4.2  CL 93*  CO2 32  GLUCOSE 113*  BUN 15  CREATININE 1.06  CALCIUM 9.6    PT/INR: No results found for this basename: LABPROT, INR,  in the last 72 hours    Assessment/Plan: S/P Procedure(s) (LRB): CORONARY ARTERY BYPASS GRAFTING (CABG) x 5 using left internal mammary artery and right leg greater saphenous vein harvested endoscopically (N/A) INTRAOPERATIVE TRANSESOPHAGEAL ECHOCARDIOGRAM (N/A) CV- SR, BPs controlled. Continue current meds.  Vol overload- diurese, back on po Lasix. Deconditioning- PT/OT/CRPI.  Elevated CBGs- stable (A1C=5.1). GI- symptoms improving.  LOC today. Disp- to SNF soon.    LOS: 20 days    Quentavious Rittenhouse H 01/21/2014

## 2014-01-21 NOTE — Evaluation (Signed)
Clinical/Bedside Swallow Evaluation Patient Details  Name: KAILANA BENNINGER MRN: [id number] Date of Birth: 1956/06/28  Today's Date: 01/21/2014 Time: 4098-1191 SLP Time Calculation (min): 15 min  Past Medical History:  Past Medical History  Diagnosis Date  . Seasonal allergies   . Asthma     History of  . Hypertension   . Iron deficiency 12-07-2011  . Anemia   . GERD (gastroesophageal reflux disease)   . Heart murmur   . Osteoporosis   . PONV (postoperative nausea and vomiting)   . Dysrhythmia     occ palpitations   . Sleep apnea     no CPAP machine , borderine   . Headache(784.0)     occasional   . Osteoarthritis     osteoarthritis   . Morbid obesity   . Acute urinary retention s/p Foley 01/07/2012  . Hemorrhoids, internal, with bleeding & prolapse 12/13/2011  . Myocardial infarction     unsure when; per cardiologist report  . Chronic headaches   . Coronary artery disease   . Anginal pain    Past Surgical History:  Past Surgical History  Procedure Laterality Date  . Knee surgery      Both knees  . Total knee arthroplasty  04/29/2011    Left knee  . Multiple tooth extractions    . Wisdom tooth extraction    . Colonoscopy  2009  . Knee arthroscopy  1991  . Transanal hemorrhoidal dearterliaization  01/06/12    with external hemorrhoid removal  . Coronary artery bypass graft N/A 01/09/2014    Procedure: CORONARY ARTERY BYPASS GRAFTING (CABG) x 5 using left internal mammary artery and right leg greater saphenous vein harvested endoscopically;  Surgeon: Ivin Poot, MD;  Location: [facility];  Service: Open Heart Surgery;  Laterality: N/A;  please use bed extenders and breast binder  . Intraoperative transesophageal echocardiogram N/A 01/09/2014    Procedure: INTRAOPERATIVE TRANSESOPHAGEAL ECHOCARDIOGRAM;  Surgeon: Ivin Poot, MD;  Location: [facility];  Service: Open Heart Surgery;  Laterality: N/A;   HPI:  Pt is a 58 year old female who is 12  Days Post-Op CORONARY ARTERY  BYPASS GRAFTING (CABG) x 5 and NTRAOPERATIVE TRANSESOPHAGEAL ECHOCARDIOGRAM. She had nausea and emesis on 2/27 and on 2/28 she described "a feeling of heaviness in her chest and stomach. She feels like her food might get stuck." though on 3/1 she reports this has improved some.    Assessment / Plan / Recommendation Clinical Impression  Pt reports globus and sensation that she is not digesting heavy foods well. She has a history of GERD as well. She did not exhibit any evidence of aspiration and oral and oropharyngeal function are WFL, except for missing dentition which leads pt to choose softer foods PTA. She reports if food is too difficult to chew, she will spit it out. SLP offered a variety of esophageal strategies and discussed softening diet to provide best texture. Pt agrees and verbalizes understanding of suggestions. Will change pt to dys 3 (mechanical soft) diet with thin liquids. Hopeful that increased mobility will also improve digestive function. No further SLP f/u needed at this time, will sign off.     Aspiration Risk  Mild    Diet Recommendation Dysphagia 3 (Mechanical Soft);Thin liquid   Liquid Administration via: Cup;Straw Medication Administration: Whole meds with puree Supervision: Patient able to self feed Compensations: Slow rate;Small sips/bites;Follow solids with liquid Postural Changes and/or Swallow Maneuvers: Seated upright 90 degrees;Upright 30-60 min after meal;Out of bed  for meals    Other  Recommendations Oral Care Recommendations: Oral care BID   Follow Up Recommendations  None    Frequency and Duration        Pertinent Vitals/Pain NA    SLP Swallow Goals     Swallow Study Prior Functional Status       General HPI: Pt is a 58 year old female who is 12  Days Post-Op CORONARY ARTERY BYPASS GRAFTING (CABG) x 5 and NTRAOPERATIVE TRANSESOPHAGEAL ECHOCARDIOGRAM. She had nausea and emesis on 2/27 and on 2/28 she described "a feeling of heaviness in her chest  and stomach. She feels like her food might get stuck." though on 3/1 she reports this has improved some.  Type of Study: Bedside swallow evaluation Previous Swallow Assessment: none in chart Diet Prior to this Study: Regular;Thin liquids Temperature Spikes Noted: No Respiratory Status: Room air History of Recent Intubation: No Behavior/Cognition: Alert;Cooperative;Pleasant mood Oral Cavity - Dentition: Missing dentition Self-Feeding Abilities: Able to feed self Patient Positioning: Upright in bed Baseline Vocal Quality: Clear Volitional Cough: Strong Volitional Swallow: Able to elicit    Oral/Motor/Sensory Function Overall Oral Motor/Sensory Function: Appears within functional limits for tasks assessed   Ice Chips     Thin Liquid Thin Liquid: Within functional limits Presentation: Self Fed (bottle)    Nectar Thick Nectar Thick Liquid: Not tested   Honey Thick Honey Thick Liquid: Not tested   Puree Puree: Not tested   Solid   GO    Solid: Within functional limits      Herbie Baltimore, MA CCC-SLP [phone]  Dariel Pellecchia, Katherene Ponto 01/21/2014,10:15 AM

## 2014-01-24 NOTE — Discharge Summary (Signed)
patient examined and medical record reviewed,agree with above note. VAN TRIGT III,Andy Moye 01/24/2014

## 2014-01-28 ENCOUNTER — Encounter (INDEPENDENT_AMBULATORY_CARE_PROVIDER_SITE_OTHER): Payer: Self-pay

## 2014-01-28 ENCOUNTER — Telehealth: Payer: Self-pay | Admitting: Thoracic Surgery (Cardiothoracic Vascular Surgery)

## 2014-01-28 DIAGNOSIS — I2510 Atherosclerotic heart disease of native coronary artery without angina pectoris: Secondary | ICD-10-CM

## 2014-01-28 NOTE — Telephone Encounter (Signed)
Patient called after office hours requesting prescriptions for all of her medications, including pain medications.  She states that she was initially discharged from the hospital to a SNF for rehab, but she left the SNF last week before her time was completed because she didn't like it.  She states that she wasn't given prescriptions to fill when she left the SNF.  I recommended that she call her cardiologist's office or her primary care physician's office in the morning to fill prescriptions for her routine medications.  I suggested that she could come by our office during office hours to be seen by a PA if she needed a prescription for pain medications.  All questions answered.   [name] H 01/28/2014 7:09 PM

## 2014-01-30 ENCOUNTER — Telehealth: Payer: Self-pay | Admitting: Internal Medicine

## 2014-01-30 NOTE — Telephone Encounter (Signed)
Need new prescriptions for Lasix,Lisinopril,Metoprolol ,Clindamycin,Hydroxycine,Rosuvastatin and Potassium Chloride.Call to [facility]

## 2014-01-30 NOTE — Telephone Encounter (Signed)
Patient to be seen 3/12 with Cecilie Kicks, NP

## 2014-01-30 NOTE — Telephone Encounter (Signed)
She also need Tramadol called in.

## 2014-01-31 ENCOUNTER — Encounter: Payer: Self-pay | Admitting: Cardiology

## 2014-01-31 ENCOUNTER — Telehealth: Payer: Self-pay | Admitting: *Deleted

## 2014-01-31 ENCOUNTER — Ambulatory Visit (INDEPENDENT_AMBULATORY_CARE_PROVIDER_SITE_OTHER): Payer: Medicare Other | Admitting: Cardiology

## 2014-01-31 VITALS — BP 140/80 | HR 107 | Ht 68.0 in | Wt 314.0 lb

## 2014-01-31 DIAGNOSIS — R5381 Other malaise: Secondary | ICD-10-CM

## 2014-01-31 DIAGNOSIS — R000 Tachycardia, unspecified: Secondary | ICD-10-CM

## 2014-01-31 DIAGNOSIS — R5383 Other fatigue: Principal | ICD-10-CM

## 2014-01-31 DIAGNOSIS — I498 Other specified cardiac arrhythmias: Secondary | ICD-10-CM

## 2014-01-31 DIAGNOSIS — R079 Chest pain, unspecified: Secondary | ICD-10-CM

## 2014-01-31 DIAGNOSIS — N950 Postmenopausal bleeding: Secondary | ICD-10-CM

## 2014-01-31 DIAGNOSIS — I200 Unstable angina: Secondary | ICD-10-CM

## 2014-01-31 LAB — CBC WITH DIFFERENTIAL/PLATELET
BASOS ABS: 0 10*3/uL (ref 0.0–0.1)
Basophils Relative: 0 % (ref 0–1)
EOS PCT: 7 % — AB (ref 0–5)
Eosinophils Absolute: 0.5 10*3/uL (ref 0.0–0.7)
HEMATOCRIT: 31.1 % — AB (ref 36.0–46.0)
Hemoglobin: 10.2 g/dL — ABNORMAL LOW (ref 12.0–15.0)
Lymphocytes Relative: 32 % (ref 12–46)
Lymphs Abs: 2.2 10*3/uL (ref 0.7–4.0)
MCH: 26.7 pg (ref 26.0–34.0)
MCHC: 32.8 g/dL (ref 30.0–36.0)
MCV: 81.4 fL (ref 78.0–100.0)
MONO ABS: 0.5 10*3/uL (ref 0.1–1.0)
Monocytes Relative: 7 % (ref 3–12)
Neutro Abs: 3.7 10*3/uL (ref 1.7–7.7)
Neutrophils Relative %: 54 % (ref 43–77)
Platelets: 404 10*3/uL — ABNORMAL HIGH (ref 150–400)
RBC: 3.82 MIL/uL — ABNORMAL LOW (ref 3.87–5.11)
RDW: 17.3 % — AB (ref 11.5–15.5)
WBC: 6.9 10*3/uL (ref 4.0–10.5)

## 2014-01-31 LAB — BASIC METABOLIC PANEL WITH GFR
BUN: 8 mg/dL (ref 6–23)
CHLORIDE: 99 meq/L (ref 96–112)
CO2: 31 mEq/L (ref 19–32)
Calcium: 9.5 mg/dL (ref 8.4–10.5)
Creat: 0.72 mg/dL (ref 0.50–1.10)
GLUCOSE: 105 mg/dL — AB (ref 70–99)
Potassium: 3.8 mEq/L (ref 3.5–5.3)
Sodium: 138 mEq/L (ref 135–145)

## 2014-01-31 MED ORDER — ESOMEPRAZOLE MAGNESIUM 40 MG PO CPDR: 40.0000 mg | DELAYED_RELEASE_CAPSULE | Freq: Every day | ORAL | Status: DC

## 2014-01-31 MED ORDER — ISOSORBIDE MONONITRATE 15 MG HALF TABLET: 15.0000 mg | ORAL_TABLET | Freq: Every day | ORAL | Status: DC

## 2014-01-31 MED ORDER — POTASSIUM CHLORIDE CRYS ER 20 MEQ PO TBCR: 20.0000 meq | EXTENDED_RELEASE_TABLET | Freq: Two times a day (BID) | ORAL | Status: DC

## 2014-01-31 MED ORDER — METOPROLOL TARTRATE 25 MG PO TABS: 25.0000 mg | ORAL_TABLET | Freq: Two times a day (BID) | ORAL | Status: DC

## 2014-01-31 MED ORDER — IRON-VITAMIN C 100-250 MG PO TABS: 1.0000 | ORAL_TABLET | Freq: Every day | ORAL | Status: DC

## 2014-01-31 MED ORDER — LISINOPRIL 2.5 MG PO TABS: 2.5000 mg | ORAL_TABLET | Freq: Every day | ORAL | Status: DC

## 2014-01-31 MED ORDER — FUROSEMIDE 40 MG PO TABS: 40.0000 mg | ORAL_TABLET | Freq: Two times a day (BID) | ORAL | Status: DC

## 2014-01-31 MED ORDER — FE FUMARATE-B12-VIT C-FA-IFC PO CAPS: 1.0000 | ORAL_CAPSULE | Freq: Three times a day (TID) | ORAL | Status: DC

## 2014-01-31 NOTE — Patient Instructions (Signed)
Have lab work done,    Resume meds  Follow up with Dr. Debara Pickett in 3 weeks

## 2014-01-31 NOTE — Progress Notes (Signed)
01/31/2014   PCP: Madilyn Fireman, MD   Chief Complaint  Patient presents with  . Follow-up    S/P Hospital visit     Primary Cardiologist: Dr. Debara Pickett  HPI:  58 year old African American obese female presented to 01/01/14 with symptoms of unstable angina.  She underwent cardiac catheterization via right radial artery which demonstrated severe three-vessel CAD with graftable target vessels. Echocardiogram showed good LV function with some left ventricular hypertrophy from her hypertension and there does not appear to be significant valve disease. Carotid dopplers showed a 1-39% bilateral carotid artery stenosis.  She underwent a CABG x 5 on 01/09/2014.  Left internal mammary artery to  LAD, saphenous vein graft to diagonal, sequential saphenous vein  graft to OM1 and OM2, saphenous vein graft to posterior descending.   She had multiple teeth extractions prior to surgery.  She did well post op.  She was anemic but did not need transfusion.  At discharge she went to SNF for rehab-she did not stay the recommended time frame due to BR not being available when she needed it. She was not given discharge medications when she left SNF.    She presents today with being off her meds for 1 week.  Her BP is elevated from the hospital.  We will resume her meds.  She tells me has been doing ok getting around at home.   No rapid HR no complaints.     Allergies  Allergen Reactions  . Cephalosporins Anaphylaxis    Tongue swelling, gum pain  . Codeine Other (See Comments)    hallucinations  . Latex Itching  . Penicillins     Pt reports no longer allergic  . Prednisone     Heart beating fast       Current Outpatient Prescriptions  Medication Sig Dispense Refill  . albuterol (PROVENTIL HFA;VENTOLIN HFA) 108 (90 BASE) MCG/ACT inhaler Inhale 2 puffs into the lungs every 6 (six) hours as needed. Wheezing  3.7 g  5  . Alum & Mag Hydroxide-Simeth (MAGIC MOUTHWASH) SOLN Take 5 mLs by  mouth 3 (three) times daily as needed for mouth pain.    0  . alum & mag hydroxide-simeth (MYLANTA DOUBLE-STRENGTH) 400-400-40 MG/5ML suspension Take 10 mLs by mouth every 6 (six) hours as needed (for cramping or bloating).  355 mL  0  . aspirin EC 325 MG EC tablet Take 1 tablet (325 mg total) by mouth daily.  30 tablet  0  . cycloSPORINE (RESTASIS) 0.05 % ophthalmic emulsion Place 1 drop into both eyes 2 (two) times daily.      . diclofenac sodium (VOLTAREN) 1 % GEL Apply 2 g topically 2 (two) times daily as needed. TOPICAL TO KNEE, SHOULDER, WRIST AND ANKLE FOR PAIN  1 Tube  0  . diphenhydrAMINE (BENADRYL) 25 mg capsule Take 1 capsule (25 mg total) by mouth every 4 (four) hours as needed for itching.  24 capsule  2  . feeding supplement, ENSURE COMPLETE, (ENSURE COMPLETE) LIQD Take 237 mLs by mouth 3 (three) times daily with meals.      . ferrous [id number] C-folic acid (TRINSICON / FOLTRIN) capsule Take 1 capsule by mouth 3 (three) times daily after meals.      . fluticasone (VERAMYST) 27.5 MCG/SPRAY nasal spray Place 2 sprays into the nose daily.      . furosemide (LASIX) 40 MG tablet Take 1 tablet (40 mg total) by mouth 2 (two) times daily.  30 tablet    . hydrOXYzine (ATARAX/VISTARIL) 10 MG tablet Take 1 tablet (10 mg total) by mouth 3 (three) times daily as needed for itching.  60 tablet  0  . Iron-Vitamin C 100-250 MG TABS Take 1 tablet by mouth daily.  60 each  2  . isosorbide mononitrate (IMDUR) 15 mg TB24 24 hr tablet Take 0.5 tablets (15 mg total) by mouth daily.      Marland Kitchen lisinopril (PRINIVIL,ZESTRIL) 2.5 MG tablet Take 1 tablet (2.5 mg total) by mouth daily.      . metoprolol tartrate (LOPRESSOR) 25 MG tablet Take 1 tablet (25 mg total) by mouth 2 (two) times daily.      . Multiple Vitamin (MULITIVITAMIN WITH MINERALS) TABS Take 1 tablet by mouth 3 (three) times a week.       Marland Kitchen NEXIUM 40 MG capsule TAKE ONE CAPSULE BY MOUTH EVERY DAY  30 capsule  1  . Polyethyl Glycol-Propyl  Glycol (SYSTANE FREE OP) Place 1 drop into both eyes daily.      . potassium chloride (K-DUR,KLOR-CON) 20 MEQ tablet Take 1 tablet (20 mEq total) by mouth 2 (two) times daily.      . rosuvastatin (CRESTOR) 20 MG tablet Take 1 tablet (20 mg total) by mouth daily at 6 PM.      . senna-docusate (SENOKOT-S) 8.6-50 MG per tablet Take 1 tablet by mouth daily.  30 tablet  5  . traMADol (ULTRAM) 50 MG tablet Take 1-2 tablets (50-100 mg total) by mouth every 6 (six) hours as needed for moderate pain.  30 tablet  0  . triamcinolone cream (KENALOG) 0.1 % Apply on affected skin 2 times a day as needed  85.2 g  1   No current facility-administered medications for this visit.    Past Medical History  Diagnosis Date  . Seasonal allergies   . Asthma     History of  . Hypertension   . Iron deficiency 12-07-2011  . Anemia   . GERD (gastroesophageal reflux disease)   . Heart murmur   . Osteoporosis   . PONV (postoperative nausea and vomiting)   . Dysrhythmia     occ palpitations   . Sleep apnea     no CPAP machine , borderine   . Headache(784.0)     occasional   . Osteoarthritis     osteoarthritis   . Morbid obesity   . Acute urinary retention s/p Foley 01/07/2012  . Hemorrhoids, internal, with bleeding & prolapse 12/13/2011  . Myocardial infarction     unsure when; per cardiologist report  . Chronic headaches   . Coronary artery disease   . Anginal pain     Past Surgical History  Procedure Laterality Date  . Knee surgery      Both knees  . Total knee arthroplasty  04/29/2011    Left knee  . Multiple tooth extractions    . Wisdom tooth extraction    . Colonoscopy  2009  . Knee arthroscopy  1991  . Transanal hemorrhoidal dearterliaization  01/06/12    with external hemorrhoid removal  . Coronary artery bypass graft N/A 01/09/2014    Procedure: CORONARY ARTERY BYPASS GRAFTING (CABG) x 5 using left internal mammary artery and right leg greater saphenous vein harvested endoscopically;   Surgeon: Ivin Poot, MD;  Location: [facility];  Service: Open Heart Surgery;  Laterality: N/A;  please use bed extenders and breast binder  . Intraoperative transesophageal echocardiogram N/A 01/09/2014    Procedure:  INTRAOPERATIVE TRANSESOPHAGEAL ECHOCARDIOGRAM;  Surgeon: Ivin Poot, MD;  Location: [facility];  Service: Open Heart Surgery;  Laterality: N/A;    [id number] colds or fevers, no weight changes Skin:no rashes or ulcers HEENT:no blurred vision, no congestion CV:see HPI PUL:see HPI GI:no diarrhea constipation or melena, no indigestion GU:no hematuria, no dysuria MS:no joint pain, no claudication Neuro:no syncope, no lightheadedness Endo:no diabetes, no thyroid disease  PHYSICAL EXAM BP 140/80  Pulse 107  Ht 5\' 8"  (1.727 m)  Wt 314 lb (142.429 kg)  BMI 47.75 kg/m2  LMP 10/26/2013 General:Pleasant affect, NAD Skin:Warm and dry, brisk capillary refill HEENT:normocephalic, sclera clear, mucus membranes moist Neck:supple, no JVD, no bruits  Heart:S1S2 RRR without murmur, gallup, rub or click, chest wall incision healing Lungs:clear without rales, rhonchi, or wheezes [id number], soft, non tender, + BS, do not palpate liver spleen or masses Ext:no lower ext edema, 2+ pedal pulses, 2+ radial pulses Neuro:alert and oriented, MAE, follows commands, + facial symmetry  EKG:S. Tach no acute changes  ASSESSMENT AND PLAN Sinus tachycardia Has been off BP meds for 1 week, will resume previous meds.  S/P CABG x 5 Discharged 01/23/14, went to SNF for rehab and due to problems with the facility she left early and did not have meds.  HYPERTENSION BP higher than I prefer, but will resume meds   Once back on meds she will follow up with Dr. Debara Pickett in 3 weeks.

## 2014-01-31 NOTE — Telephone Encounter (Signed)
Thank you :)

## 2014-01-31 NOTE — Assessment & Plan Note (Signed)
BP higher than I prefer, but will resume meds

## 2014-01-31 NOTE — Telephone Encounter (Signed)
Lasix,iron,imdur,lisinopril,lopressor,nexium,k-dur reordered electronically today at appt.

## 2014-01-31 NOTE — Assessment & Plan Note (Signed)
Has been off BP meds for 1 week, will resume previous meds.

## 2014-01-31 NOTE — Telephone Encounter (Signed)
We will refill at appt

## 2014-01-31 NOTE — Assessment & Plan Note (Signed)
Discharged 01/23/14, went to SNF for rehab and due to problems with the facility she left early and did not have meds.

## 2014-02-01 ENCOUNTER — Other Ambulatory Visit: Payer: Self-pay | Admitting: *Deleted

## 2014-02-01 DIAGNOSIS — G8918 Other acute postprocedural pain: Secondary | ICD-10-CM

## 2014-02-01 MED ORDER — LISINOPRIL 2.5 MG PO TABS: 2.5000 mg | ORAL_TABLET | Freq: Every day | ORAL | Status: DC

## 2014-02-01 MED ORDER — TRAMADOL HCL 50 MG PO TABS: 50.0000 mg | ORAL_TABLET | Freq: Four times a day (QID) | ORAL | Status: DC | PRN

## 2014-02-01 MED ORDER — ESOMEPRAZOLE MAGNESIUM 40 MG PO CPDR: 40.0000 mg | DELAYED_RELEASE_CAPSULE | Freq: Every day | ORAL | Status: DC

## 2014-02-01 MED ORDER — POTASSIUM CHLORIDE CRYS ER 20 MEQ PO TBCR: 20.0000 meq | EXTENDED_RELEASE_TABLET | Freq: Two times a day (BID) | ORAL | Status: DC

## 2014-02-01 MED ORDER — FUROSEMIDE 40 MG PO TABS: 40.0000 mg | ORAL_TABLET | Freq: Two times a day (BID) | ORAL | Status: DC

## 2014-02-01 MED ORDER — FE FUMARATE-B12-VIT C-FA-IFC PO CAPS: 1.0000 | ORAL_CAPSULE | Freq: Three times a day (TID) | ORAL | Status: DC

## 2014-02-01 MED ORDER — METOPROLOL TARTRATE 25 MG PO TABS: 25.0000 mg | ORAL_TABLET | Freq: Two times a day (BID) | ORAL | Status: DC

## 2014-02-01 MED ORDER — ISOSORBIDE MONONITRATE 15 MG HALF TABLET: 15.0000 mg | ORAL_TABLET | Freq: Every day | ORAL | Status: DC

## 2014-02-01 NOTE — Telephone Encounter (Signed)
PHARMACY CALLED NEED PRESCRIPTION THAT WERE REFILLED FROM 01/31/14. SHE STATES THAT DID NOT RECEIVE RX REFILL  E-sent the lasix  imdur lisionpril  Lopressor ,nexuim ,k-dur

## 2014-02-01 NOTE — Telephone Encounter (Signed)
Per [name] the pharmacy called  And said her medication is ready ,but we did not send anything for pain and wants to know if we are going to send in something for pain and if so can it be today .Marland Kitchen Please Call if you have any questions .Marland Kitchen    Thanks

## 2014-02-01 NOTE — Telephone Encounter (Signed)
Call to pt and verified x 2.  Pt informed message received r/t pain meds and advised she contact her surgeon as our office does not prescribe pain meds and her pain mgmt would be done in their office.  Pt verbalized understanding and agreed w/ plan.

## 2014-02-04 NOTE — Progress Notes (Signed)
Pt. Informed of hgb improving

## 2014-02-05 ENCOUNTER — Other Ambulatory Visit: Payer: Self-pay | Admitting: *Deleted

## 2014-02-05 DIAGNOSIS — I2510 Atherosclerotic heart disease of native coronary artery without angina pectoris: Secondary | ICD-10-CM

## 2014-02-13 ENCOUNTER — Ambulatory Visit (INDEPENDENT_AMBULATORY_CARE_PROVIDER_SITE_OTHER): Payer: Self-pay | Admitting: Cardiothoracic Surgery

## 2014-02-13 ENCOUNTER — Ambulatory Visit
Admission: RE | Admit: 2014-02-13 | Discharge: 2014-02-13 | Disposition: A | Discharge status: Home / Self Care | Payer: Medicare Other | Source: Ambulatory Visit | Attending: Cardiothoracic Surgery | Admitting: Cardiothoracic Surgery

## 2014-02-13 VITALS — BP 163/103 | HR 105 | Resp 18 | Ht 68.0 in | Wt 289.0 lb

## 2014-02-13 DIAGNOSIS — Z951 Presence of aortocoronary bypass graft: Secondary | ICD-10-CM

## 2014-02-13 DIAGNOSIS — I2510 Atherosclerotic heart disease of native coronary artery without angina pectoris: Secondary | ICD-10-CM

## 2014-02-13 MED ORDER — TRAMADOL HCL 50 MG PO TABS: 50.0000 mg | ORAL_TABLET | Freq: Four times a day (QID) | ORAL | Status: DC | PRN

## 2014-02-13 NOTE — Progress Notes (Signed)
PCP is Madilyn Fireman, MD Referring Provider is Croitoru, Dani Gobble, MD  Chief Complaint  Patient presents with  . Routine Post Op    4 wk f/u with cxr s/p CABG X 5 ..Marland KitchenMarland Kitchen2/18/15    HPI: The patient returns for one month followup after multivessel CABG. The patient is morbidly obese and her postoperative course was prolonged by deconditioning and immobility. She was discharged to a skilled nursing facility but only stayed 4 days before discharging herself returning home. Her home medical compliance is suspect. She states she has been losing some weight and is now less than 300 pounds. Fortunately the surgical incisions are healing well. She denies any symptoms of angina or CHF. Her blood pressure and heart rate are elevated and adjustments to her medications were made during the visit today.  Past Medical History  Diagnosis Date  . Seasonal allergies   . Asthma     History of  . Hypertension   . Iron deficiency 12-07-2011  . Anemia   . GERD (gastroesophageal reflux disease)   . Heart murmur   . Osteoporosis   . PONV (postoperative nausea and vomiting)   . Dysrhythmia     occ palpitations   . Sleep apnea     no CPAP machine , borderine   . Headache(784.0)     occasional   . Osteoarthritis     osteoarthritis   . Morbid obesity   . Acute urinary retention s/p Foley 01/07/2012  . Hemorrhoids, internal, with bleeding & prolapse 12/13/2011  . Myocardial infarction     unsure when; per cardiologist report  . Chronic headaches   . Coronary artery disease   . Anginal pain     Past Surgical History  Procedure Laterality Date  . Knee surgery      Both knees  . Total knee arthroplasty  04/29/2011    Left knee  . Multiple tooth extractions    . Wisdom tooth extraction    . Colonoscopy  2009  . Knee arthroscopy  1991  . Transanal hemorrhoidal dearterliaization  01/06/12    with external hemorrhoid removal  . Coronary artery bypass graft N/A 01/09/2014    Procedure: CORONARY ARTERY  BYPASS GRAFTING (CABG) x 5 using left internal mammary artery and right leg greater saphenous vein harvested endoscopically;  Surgeon: Ivin Poot, MD;  Location: [facility];  Service: Open Heart Surgery;  Laterality: N/A;  please use bed extenders and breast binder  . Intraoperative transesophageal echocardiogram N/A 01/09/2014    Procedure: INTRAOPERATIVE TRANSESOPHAGEAL ECHOCARDIOGRAM;  Surgeon: Ivin Poot, MD;  Location: [facility];  Service: Open Heart Surgery;  Laterality: N/A;    Family History  Problem Relation Age of Onset  . Colon cancer Neg Hx   . Diabetes Mother   . Hypertension Mother   . Heart disease Sister     Congestive heart failure  . Diabetes type II Sister   . Kidney disease Brother   . Heart attack Mother 1    Social History History  Substance Use Topics  . Smoking status: Never Smoker   . Smokeless tobacco: Never Used  . Alcohol Use: No    Current Outpatient Prescriptions  Medication Sig Dispense Refill  . albuterol (PROVENTIL HFA;VENTOLIN HFA) 108 (90 BASE) MCG/ACT inhaler Inhale 2 puffs into the lungs every 6 (six) hours as needed. Wheezing  3.7 g  5  . alum & mag hydroxide-simeth (MYLANTA DOUBLE-STRENGTH) 400-400-40 MG/5ML suspension Take 10 mLs by mouth every 6 (six) hours as  needed (for cramping or bloating).  355 mL  0  . aspirin EC 325 MG EC tablet Take 1 tablet (325 mg total) by mouth daily.  30 tablet  0  . cycloSPORINE (RESTASIS) 0.05 % ophthalmic emulsion Place 1 drop into both eyes 2 (two) times daily.      . diclofenac sodium (VOLTAREN) 1 % GEL Apply 2 g topically 2 (two) times daily as needed. TOPICAL TO KNEE, SHOULDER, WRIST AND ANKLE FOR PAIN  1 Tube  0  . diphenhydrAMINE (BENADRYL) 25 mg capsule Take 1 capsule (25 mg total) by mouth every 4 (four) hours as needed for itching.  24 capsule  2  . esomeprazole (NEXIUM) 40 MG capsule Take 1 capsule (40 mg total) by mouth daily.  30 capsule  6  . fluticasone (VERAMYST) 27.5 MCG/SPRAY nasal spray  Place 2 sprays into the nose daily.      . furosemide (LASIX) 40 MG tablet Take 1 tablet (40 mg total) by mouth 2 (two) times daily.  30 tablet  6  . hydrOXYzine (ATARAX/VISTARIL) 10 MG tablet Take 1 tablet (10 mg total) by mouth 3 (three) times daily as needed for itching.  60 tablet  0  . Iron-Vitamin C 100-250 MG TABS Take 1 tablet by mouth daily.  60 each  6  . isosorbide mononitrate (IMDUR) 15 mg TB24 24 hr tablet Take 0.5 tablets (15 mg total) by mouth daily.  30 tablet  6  . lisinopril (PRINIVIL,ZESTRIL) 2.5 MG tablet Take 1 tablet (2.5 mg total) by mouth daily.  30 tablet  6  . metoprolol tartrate (LOPRESSOR) 25 MG tablet Take 1 tablet (25 mg total) by mouth 2 (two) times daily.  60 tablet  6  . Multiple Vitamin (MULITIVITAMIN WITH MINERALS) TABS Take 1 tablet by mouth 3 (three) times a week.       Vladimir Faster Glycol-Propyl Glycol (SYSTANE FREE OP) Place 1 drop into both eyes daily.      . potassium chloride SA (K-DUR,KLOR-CON) 20 MEQ tablet Take 1 tablet (20 mEq total) by mouth 2 (two) times daily.  30 tablet  6  . rosuvastatin (CRESTOR) 20 MG tablet Take 1 tablet (20 mg total) by mouth daily at 6 PM.      . senna-docusate (SENOKOT-S) 8.6-50 MG per tablet Take 1 tablet by mouth daily.  30 tablet  5  . traMADol (ULTRAM) 50 MG tablet Take 1-2 tablets (50-100 mg total) by mouth every 6 (six) hours as needed for moderate pain.  30 tablet  0  . triamcinolone cream (KENALOG) 0.1 % Apply on affected skin 2 times a day as needed  85.2 g  1  . feeding supplement, ENSURE COMPLETE, (ENSURE COMPLETE) LIQD Take 237 mLs by mouth 3 (three) times daily with meals.       No current facility-administered medications for this visit.    Allergies  Allergen Reactions  . Cephalosporins Anaphylaxis    Tongue swelling, gum pain  . Codeine Other (See Comments)    hallucinations  . Latex Itching  . Penicillins     Pt reports no longer allergic  . Prednisone     Heart beating fast       Review of  Systems gradual weight loss after surgery, intentional No fever or drainage from her surgical incisions  BP 163/103  Pulse 105  Resp 18  Ht 5\' 8"  (1.727 m)  Wt 289 lb (131.09 kg)  BMI 43.95 kg/m2  SpO2 98%  LMP 10/26/2013 Physical  Exam Alert and pleasant looks good today Lungs clear Sternum well-healed and stable Heart rhythm regular without murmur Leg incision healed no significant pedal edema  Diagnostic Tests: Chest x-ray clear, sternal wires intact  Impression: Good early recovery following CABG. The patient to resume driving in normal in house activity. She was recommended to start outpatient cardiac rehabilitation.  Her blood pressure is too high and her heart rate is too high-her Lopressor was increased to 50 mg twice a day. She will stop her Imdur. She was given refill for tramadol. She reduced her Lasix to 40 mg tablet daily and she will continue her Crestor and aspirin.  Plan: Patient return for followup in approximately 4-6 weeks to monitor progress. She will refer to outpatient cardiac rehabilitation and was encouraged to take her medications as directed

## 2014-02-20 ENCOUNTER — Encounter: Payer: Self-pay | Admitting: *Deleted

## 2014-02-26 ENCOUNTER — Telehealth: Payer: Self-pay | Admitting: *Deleted

## 2014-02-26 NOTE — Telephone Encounter (Signed)
Faxed to [facility] for phase 2 cardiac rehab & medicaid reimbursement information

## 2014-02-27 ENCOUNTER — Encounter: Payer: Self-pay | Admitting: Internal Medicine

## 2014-02-27 ENCOUNTER — Ambulatory Visit (INDEPENDENT_AMBULATORY_CARE_PROVIDER_SITE_OTHER): Payer: Medicare Other | Admitting: Internal Medicine

## 2014-02-27 VITALS — BP 130/70 | HR 83 | Ht 68.0 in | Wt 310.3 lb

## 2014-02-27 DIAGNOSIS — I10 Essential (primary) hypertension: Secondary | ICD-10-CM

## 2014-02-27 DIAGNOSIS — I2510 Atherosclerotic heart disease of native coronary artery without angina pectoris: Secondary | ICD-10-CM

## 2014-02-27 DIAGNOSIS — I200 Unstable angina: Secondary | ICD-10-CM

## 2014-02-27 DIAGNOSIS — Z951 Presence of aortocoronary bypass graft: Secondary | ICD-10-CM

## 2014-02-27 DIAGNOSIS — E785 Hyperlipidemia, unspecified: Secondary | ICD-10-CM

## 2014-02-27 MED ORDER — ROSUVASTATIN CALCIUM 20 MG PO TABS
20.0000 mg | ORAL_TABLET | Freq: Every day | ORAL | Status: DC
Start: 2014-02-27 — End: 2014-02-27

## 2014-02-27 MED ORDER — ROSUVASTATIN CALCIUM 20 MG PO TABS: 20.0000 mg | ORAL_TABLET | Freq: Every day | ORAL | Status: DC

## 2014-02-27 NOTE — Patient Instructions (Signed)
Your physician has recommended you make the following change in your medication: START Crestor 20mg  once daily.   Your physician recommends that you return for lab work in: 6 months (fasting - nothing to eat/drink after midnight prior) Please use a [facility] lab  Your physician wants you to follow-up in: 6 months with Dr. Debara Pickett. You will receive a reminder letter in the mail two months in advance. If you don't receive a letter, please call our office to schedule the follow-up appointment.

## 2014-02-27 NOTE — Progress Notes (Signed)
OFFICE NOTE  Chief Complaint:  Hospital follow-up, post-CABG  Primary Care Physician: Madilyn Fireman, MD  HPI:  [name] is a 58 year old African American obese female presented to 01/01/14 with symptoms of unstable angina. I had seen her once in 2012, but not since then.  She recently had been having chest pain and presented urgently when she became short of breath at home and had chest tightness, which started as right arm pain and right chest pain, then moved substernally. She ultimately underwent cardiac catheterization via right radial artery which demonstrated severe three-vessel CAD with graftable target vessels. Echocardiogram showed good LV function with some left ventricular hypertrophy from her hypertension and there does not appear to be significant valve disease. Carotid dopplers showed a 1-39% bilateral carotid artery stenosis. She underwent a CABG x 5 on 01/09/2014. Left internal mammary artery to LAD, saphenous vein graft to diagonal, sequential saphenous vein graft to OM1 and OM2, saphenous vein graft to posterior descending. She had multiple teeth extractions prior to surgery. She did well post op. She was anemic but did not need transfusion. At discharge she went to SNF for rehab-she did not stay the recommended time frame due to BR not being available when she needed it. She was not given discharge medications when she left SNF. She saw Cecilie Kicks, FNP, in followup for her hospitalization and was restarted on some of her medications. She has not yet restarted on a lipid medication.  PMHx:  Past Medical History  Diagnosis Date  . Seasonal allergies   . Asthma     History of  . Hypertension   . Iron deficiency 12-07-2011  . Anemia   . GERD (gastroesophageal reflux disease)   . Heart murmur   . Osteoporosis   . PONV (postoperative nausea and vomiting)   . Dysrhythmia     occ palpitations   . Sleep apnea     no CPAP machine; sleep study 02/2010 REM AHI 61.7/hr,  total sleep REM 14.8/hr  . Headache(784.0)     occasional   . Osteoarthritis   . Morbid obesity   . Acute urinary retention s/p Foley 01/07/2012  . Hemorrhoids, internal, with bleeding & prolapse 12/13/2011  . Myocardial infarction     unsure when; per cardiologist report  . Chronic headaches   . Coronary artery disease   . Anginal pain   . Dyslipidemia   . S/P CABG (coronary artery bypass graft)     x5 - LIMA to LAD, SVG to diagonal, SVG to OM1, SVG to OM2, SVG to PDA (Dr. Prescott Gum)    Past Surgical History  Procedure Laterality Date  . Knee surgery Bilateral 1999  . Total knee arthroplasty Left 04/29/2011  . Multiple tooth extractions    . Wisdom tooth extraction    . Colonoscopy  2009  . Knee arthroscopy  1991  . Transanal hemorrhoidal dearterliaization  01/06/12    with external hemorrhoid removal  . Coronary artery bypass graft N/A 01/09/2014    Procedure: CORONARY ARTERY BYPASS GRAFTING (CABG) x 5 using left internal mammary artery and right leg greater saphenous vein harvested endoscopically;  Surgeon: Ivin Poot, MD;  Location: [place];  Service: Open Heart Surgery;  Laterality: N/A;  please use bed extenders and breast binder  . Intraoperative transesophageal echocardiogram N/A 01/09/2014    Procedure: INTRAOPERATIVE TRANSESOPHAGEAL ECHOCARDIOGRAM;  Surgeon: Ivin Poot, MD;  Location: [place];  Service: Open Heart Surgery;  Laterality: N/A;  . Tee without  cardioversion  01/2010    EF 60-65%, small, flat, non-infiltrating, calcified, fixed apical/septal mass  . Nm myocar perf wall motion  01/2010    dipyridamole myoview - moderate perfusion defect in basal inferoseptal, basal inferior, mid inferoseptal, mid inferior, apical inferior region; EF 56%    FAMHx:  Family History  Problem Relation Age of Onset  . Colon cancer Neg Hx   . Hypertension Sister   . Heart failure Sister   . Heart disease Sister   . Diabetes type II Sister   . Kidney disease Brother      hypertension  . Heart attack Mother 8  . Heart disease Mother   . Hypertension Mother   . Diabetes Mother   . Hypertension Child     SOCHx:   reports that she has never smoked. She has never used smokeless tobacco. She reports that she does not drink alcohol or use illicit drugs.  ALLERGIES:  Allergies  Allergen Reactions  . Cephalosporins Anaphylaxis    Tongue swelling, gum pain  . Codeine Other (See Comments)    hallucinations  . Latex Itching  . Penicillins     Pt reports no longer allergic  . Prednisone     Heart beating fast       ROS: A comprehensive review of systems was negative except for: Cardiovascular: positive for chest wall pain  HOME MEDS: Current Outpatient Prescriptions  Medication Sig Dispense Refill  . albuterol (PROVENTIL HFA;VENTOLIN HFA) 108 (90 BASE) MCG/ACT inhaler Inhale 2 puffs into the lungs every 6 (six) hours as needed. Wheezing  3.7 g  5  . alum & mag hydroxide-simeth (MYLANTA DOUBLE-STRENGTH) 400-400-40 MG/5ML suspension Take 10 mLs by mouth every 6 (six) hours as needed (for cramping or bloating).  355 mL  0  . aspirin EC 325 MG EC tablet Take 1 tablet (325 mg total) by mouth daily.  30 tablet  0  . cycloSPORINE (RESTASIS) 0.05 % ophthalmic emulsion Place 1 drop into both eyes 2 (two) times daily.      . diclofenac sodium (VOLTAREN) 1 % GEL Apply 2 g topically 2 (two) times daily as needed. TOPICAL TO KNEE, SHOULDER, WRIST AND ANKLE FOR PAIN  1 Tube  0  . diphenhydrAMINE (BENADRYL) 25 mg capsule Take 1 capsule (25 mg total) by mouth every 4 (four) hours as needed for itching.  24 capsule  2  . esomeprazole (NEXIUM) 40 MG capsule Take 1 capsule (40 mg total) by mouth daily.  30 capsule  6  . fluticasone (VERAMYST) 27.5 MCG/SPRAY nasal spray Place 2 sprays into the nose daily.      . furosemide (LASIX) 40 MG tablet Take 1 tablet (40 mg total) by mouth 2 (two) times daily.  30 tablet  6  . hydrOXYzine (ATARAX/VISTARIL) 10 MG tablet Take 1  tablet (10 mg total) by mouth 3 (three) times daily as needed for itching.  60 tablet  0  . ibuprofen (ADVIL,MOTRIN) 400 MG tablet Take 800 mg by mouth every 8 (eight) hours as needed.      Marland Kitchen lisinopril (PRINIVIL,ZESTRIL) 2.5 MG tablet Take 1 tablet (2.5 mg total) by mouth daily.  30 tablet  6  . loratadine (CLARITIN) 10 MG tablet Take 10 mg by mouth daily.      . metoprolol tartrate (LOPRESSOR) 25 MG tablet Take 25-50 mg by mouth 2 (two) times daily. 50mg  AM 25mg  PM      . Multiple Vitamin (MULITIVITAMIN WITH MINERALS) TABS Take 1 tablet by  mouth 3 (three) times a week.       Vladimir Faster Glycol-Propyl Glycol (SYSTANE FREE OP) Place 1 drop into both eyes daily.      . potassium chloride SA (K-DUR,KLOR-CON) 20 MEQ tablet Take 1 tablet (20 mEq total) by mouth 2 (two) times daily.  30 tablet  6  . senna-docusate (SENOKOT-S) 8.6-50 MG per tablet Take 1 tablet by mouth daily.  30 tablet  5  . triamcinolone cream (KENALOG) 0.1 % Apply on affected skin 2 times a day as needed  85.2 g  1  . rosuvastatin (CRESTOR) 20 MG tablet Take 1 tablet (20 mg total) by mouth daily.  28 tablet  0   No current facility-administered medications for this visit.    LABS/IMAGING: No results found for this or any previous visit (from the past 48 hour(s)). No results found.  VITALS: BP 130/70  Pulse 83  Ht 5\' 8"  (1.727 m)  Wt 310 lb 4.8 oz (140.751 kg)  BMI 47.19 kg/m2  LMP 10/26/2013  EXAM: General appearance: alert, no distress and morbidly obese Neck: no carotid bruit and no JVD Lungs: clear to auscultation bilaterally Heart: regular rate and rhythm, S1, S2 normal, no murmur, click, rub or gallop Abdomen: soft, non-tender; bowel sounds normal; no masses,  no organomegaly Extremities: edema trace pedal Pulses: 2+ and symmetric Skin: Skin color, texture, turgor normal. No rashes or lesions Neurologic: Grossly normal Psych: Mood, affect normal  EKG: Normal sinus rhythm at 83  ASSESSMENT: 1. Coronary  artery disease status post 5 vessel CABG 2. Morbid obesity 3. Hypertension 4. Dyslipidemia 5. OSA, not on cPAP  PLAN: 1.   Ms. [place] is recovering from coronary artery bypass grafting. She's about to enroll in cardiac rehabilitation. I think this is a good idea for her work on exercise and weight loss. She needs to be fitted with CPAP and will work on that. Her hypertension is now well controlled after restarting her medications. She needs to restart on Crestor for her dyslipidemia and I given her samples and prescription for 20 mg tablets today. We'll plan followup in 6 months. I understand she is to followup with her surgeon in one month and hopefully can clear her to undergo further dental surgery which is necessary.  [name], MD, [facility] Attending Cardiologist August 02/27/2014, 1:27 PM

## 2014-03-07 ENCOUNTER — Encounter (HOSPITAL_COMMUNITY)
Admission: RE | Admit: 2014-03-07 | Discharge: 2014-03-07 | Disposition: A | Discharge status: Home / Self Care | Payer: Medicare Other | Source: Ambulatory Visit | Attending: Internal Medicine | Admitting: Internal Medicine

## 2014-03-07 DIAGNOSIS — Z951 Presence of aortocoronary bypass graft: Secondary | ICD-10-CM | POA: Insufficient documentation

## 2014-03-07 DIAGNOSIS — Z5189 Encounter for other specified aftercare: Secondary | ICD-10-CM | POA: Insufficient documentation

## 2014-03-07 DIAGNOSIS — I2510 Atherosclerotic heart disease of native coronary artery without angina pectoris: Secondary | ICD-10-CM | POA: Insufficient documentation

## 2014-03-07 NOTE — Progress Notes (Signed)
Cardiac Rehab Medication Review by a Pharmacist  Does the patient  feel that his/her medications are working for him/her?  yes  Has the patient been experiencing any side effects to the medications prescribed?  no  Does the patient measure his/her own blood pressure or blood glucose at home?  no   Does the patient have any problems obtaining medications due to transportation or finances?   no  Understanding of regimen: good Understanding of indications: fair Potential of compliance: fair    Pharmacist comments:   [name] is a 58yo AAF who presents this morning in good spirits, walking with assistance of a cane. I have updated all medications and allergies. Of note, patient has a lengthy medication list. When asked how she manages them, patient reports taking them out of the bottle each day and flipping each bottle as they are taken. She does report having a pillbox at home but has not used it since her hospitalization. I have recommended using her pillbox which she is agreeable to. Her only other complaint at this time is decreased appetite since the hospitalization but seems to be improving.   [name]. [name], PharmD Clinical Pharmacist - Resident Phone: [phone] Pager: [phone] 03/07/2014 9:30 AM

## 2014-03-11 ENCOUNTER — Encounter (HOSPITAL_COMMUNITY)
Admission: RE | Admit: 2014-03-11 | Discharge: 2014-03-11 | Disposition: A | Discharge status: Home / Self Care | Payer: Medicare Other | Source: Ambulatory Visit | Attending: Internal Medicine | Admitting: Internal Medicine

## 2014-03-11 DIAGNOSIS — I2510 Atherosclerotic heart disease of native coronary artery without angina pectoris: Secondary | ICD-10-CM | POA: Diagnosis not present

## 2014-03-11 DIAGNOSIS — Z951 Presence of aortocoronary bypass graft: Secondary | ICD-10-CM | POA: Diagnosis not present

## 2014-03-11 DIAGNOSIS — Z5189 Encounter for other specified aftercare: Secondary | ICD-10-CM | POA: Diagnosis present

## 2014-03-11 NOTE — Progress Notes (Signed)
Pt in today for her first day of exercise at the 11:15 am phase II outpatient cardiac rehab program. Pt tolerated light exercise with no complaints.  Pt able to ambulate better with walker verses cane.  Monitor showed Sr with no noted ectopy.  PHQ2 score 0.  Pt feels positive and happy about her future and is excited to be in the program.  Pt short term goal is to lose 10 -15 pounds.  Will advise pt of nutritional classes held on tuesdays and will meet with pt to talk about home exercise. The combined effort makes this an achievable goal as a long term goal but healthy weight loss is 1-2 pounds a week.  Pt long term goal is to go back to school, increase endurance and increase flexibility.  Will have pt attend the core stretching class.  Continue to monitor.

## 2014-03-13 ENCOUNTER — Encounter (HOSPITAL_COMMUNITY)
Admission: RE | Admit: 2014-03-13 | Discharge: 2014-03-13 | Disposition: A | Discharge status: Home / Self Care | Payer: Medicare Other | Source: Ambulatory Visit | Attending: Internal Medicine | Admitting: Internal Medicine

## 2014-03-13 DIAGNOSIS — Z5189 Encounter for other specified aftercare: Secondary | ICD-10-CM | POA: Diagnosis not present

## 2014-03-15 ENCOUNTER — Encounter (HOSPITAL_COMMUNITY)
Admission: RE | Admit: 2014-03-15 | Discharge: 2014-03-15 | Disposition: A | Discharge status: Home / Self Care | Payer: Medicare Other | Source: Ambulatory Visit | Attending: Internal Medicine | Admitting: Internal Medicine

## 2014-03-15 DIAGNOSIS — Z5189 Encounter for other specified aftercare: Secondary | ICD-10-CM | POA: Diagnosis not present

## 2014-03-18 ENCOUNTER — Telehealth (HOSPITAL_COMMUNITY): Payer: Self-pay | Admitting: *Deleted

## 2014-03-18 ENCOUNTER — Encounter (HOSPITAL_COMMUNITY)
Admission: RE | Admit: 2014-03-18 | Discharge: 2014-03-18 | Disposition: A | Discharge status: Home / Self Care | Payer: Medicare Other | Source: Ambulatory Visit | Attending: Internal Medicine | Admitting: Internal Medicine

## 2014-03-18 ENCOUNTER — Telehealth: Payer: Self-pay | Admitting: *Deleted

## 2014-03-18 NOTE — Telephone Encounter (Signed)
Angela Nevin, a nurse at [facility] called and left a message requesting an appointment for [name]- states [name] is there today for exercise post  Heart bypass surgery in February and reports new vaginal bleeding and wants to be seen in our clinic.  Per chart review has been seen in our clinic , so is a returning patient. Will forward to front office staff to schedule appt and they will call pt. With appt.

## 2014-03-18 NOTE — Telephone Encounter (Addendum)
Pt informed that rehab staff talked to [facility] clinic.  First available appt is June 3 at 2:15.  Pt advised to seek medical attention from her primary MD to have someone evaluate the vaginal bleeding sooner.  Pt verbalized understanding.  Pt declined offered assistance to call and make appt with primary MD.  Pt planned to call as soon as she got off the home.

## 2014-03-18 NOTE — Progress Notes (Signed)
Pt returned to exercise today arriving late and complained of feeling weak and fatigued since leaving exercise on Friday. BP elevated 126/94.  Pt had not eaten breakfast.  Pt given oj, breakfast cookies and banana to eat.  Pt advised not to exercise today. Pt remarked that she was having new vaginal bleeding that started over the week end.  Pt described heavy flow and multiple clots.  Pt denies any cramping.  Pt felt her breast were enlarged with "fluid".  Pt stated that prior to her heart bypass surgery she was completing a work up for vaginal bleeding.  Pt had not had any episodes of this during her hospital stay nor during the first part of her recovery.  Pt has been seen by the [facility] clinic and is an established pt.  Called to make appointment with the clinic.  Unable to speak to someone because they were at lunch.  Left message on the triage line for them to call pt.  Pt felt okay to drive herself home and denies feeling weak.   Pt advised that if she does not here from the clinic to call them back.  Pt verbalized understanding and is in agreement.

## 2014-03-20 ENCOUNTER — Encounter (HOSPITAL_COMMUNITY)
Admission: RE | Admit: 2014-03-20 | Discharge: 2014-03-20 | Disposition: A | Discharge status: Home / Self Care | Payer: Medicare Other | Source: Ambulatory Visit | Attending: Internal Medicine | Admitting: Internal Medicine

## 2014-03-20 DIAGNOSIS — Z5189 Encounter for other specified aftercare: Secondary | ICD-10-CM | POA: Diagnosis not present

## 2014-03-20 NOTE — Progress Notes (Signed)
Reviewed home exercise with pt today.  Pt plans to continue walking at home for exercise.  Reviewed THR, pulse, RPE, sign and symptoms, and when to call 911 or MD.  Pt voiced understanding. Kamden Stanislaw, MA, ACSM RCEP  

## 2014-03-22 ENCOUNTER — Encounter (HOSPITAL_COMMUNITY): Admission: RE | Admit: 2014-03-22 | Payer: Medicare Other | Source: Ambulatory Visit

## 2014-03-22 ENCOUNTER — Telehealth (HOSPITAL_COMMUNITY): Payer: Self-pay | Admitting: *Deleted

## 2014-03-24 ENCOUNTER — Other Ambulatory Visit: Payer: Self-pay | Admitting: Internal Medicine

## 2014-03-25 ENCOUNTER — Other Ambulatory Visit: Payer: Self-pay

## 2014-03-25 ENCOUNTER — Encounter (HOSPITAL_COMMUNITY)
Admission: RE | Admit: 2014-03-25 | Discharge: 2014-03-25 | Disposition: A | Discharge status: Home / Self Care | Payer: Medicare Other | Source: Ambulatory Visit | Attending: Internal Medicine | Admitting: Internal Medicine

## 2014-03-25 ENCOUNTER — Inpatient Hospital Stay (HOSPITAL_COMMUNITY)
Admission: AD | Admit: 2014-03-25 | Discharge: 2014-03-25 | Disposition: A | Discharge status: Home / Self Care | Payer: Medicare Other | Source: Ambulatory Visit | Attending: Emergency Medicine | Admitting: Emergency Medicine

## 2014-03-25 ENCOUNTER — Encounter (HOSPITAL_COMMUNITY): Payer: Self-pay | Admitting: *Deleted

## 2014-03-25 ENCOUNTER — Other Ambulatory Visit: Payer: Self-pay | Admitting: Cardiothoracic Surgery

## 2014-03-25 DIAGNOSIS — R011 Cardiac murmur, unspecified: Secondary | ICD-10-CM | POA: Insufficient documentation

## 2014-03-25 DIAGNOSIS — N938 Other specified abnormal uterine and vaginal bleeding: Secondary | ICD-10-CM | POA: Insufficient documentation

## 2014-03-25 DIAGNOSIS — Z951 Presence of aortocoronary bypass graft: Secondary | ICD-10-CM | POA: Insufficient documentation

## 2014-03-25 DIAGNOSIS — I2510 Atherosclerotic heart disease of native coronary artery without angina pectoris: Secondary | ICD-10-CM | POA: Insufficient documentation

## 2014-03-25 DIAGNOSIS — R0789 Other chest pain: Secondary | ICD-10-CM

## 2014-03-25 DIAGNOSIS — N939 Abnormal uterine and vaginal bleeding, unspecified: Secondary | ICD-10-CM

## 2014-03-25 DIAGNOSIS — Z5189 Encounter for other specified aftercare: Secondary | ICD-10-CM | POA: Insufficient documentation

## 2014-03-25 DIAGNOSIS — K219 Gastro-esophageal reflux disease without esophagitis: Secondary | ICD-10-CM | POA: Insufficient documentation

## 2014-03-25 DIAGNOSIS — D649 Anemia, unspecified: Secondary | ICD-10-CM | POA: Insufficient documentation

## 2014-03-25 DIAGNOSIS — R079 Chest pain, unspecified: Secondary | ICD-10-CM | POA: Insufficient documentation

## 2014-03-25 DIAGNOSIS — N949 Unspecified condition associated with female genital organs and menstrual cycle: Secondary | ICD-10-CM | POA: Insufficient documentation

## 2014-03-25 DIAGNOSIS — G4730 Sleep apnea, unspecified: Secondary | ICD-10-CM | POA: Insufficient documentation

## 2014-03-25 DIAGNOSIS — I10 Essential (primary) hypertension: Secondary | ICD-10-CM | POA: Insufficient documentation

## 2014-03-25 LAB — I-STAT CHEM 8, ED
BUN: 13 mg/dL (ref 6–23)
CALCIUM ION: 1.18 mmol/L (ref 1.12–1.23)
Chloride: 98 mEq/L (ref 96–112)
Creatinine, Ser: 1 mg/dL (ref 0.50–1.10)
Glucose, Bld: 111 mg/dL — ABNORMAL HIGH (ref 70–99)
HCT: 32 % — ABNORMAL LOW (ref 36.0–46.0)
Hemoglobin: 10.9 g/dL — ABNORMAL LOW (ref 12.0–15.0)
Potassium: 3.9 mEq/L (ref 3.7–5.3)
Sodium: 141 mEq/L (ref 137–147)
TCO2: 28 mmol/L (ref 0–100)

## 2014-03-25 LAB — CBC
HEMATOCRIT: 31.3 % — AB (ref 36.0–46.0)
HEMOGLOBIN: 10.3 g/dL — AB (ref 12.0–15.0)
MCH: 25.9 pg — ABNORMAL LOW (ref 26.0–34.0)
MCHC: 32.9 g/dL (ref 30.0–36.0)
MCV: 78.6 fL (ref 78.0–100.0)
Platelets: 271 10*3/uL (ref 150–400)
RBC: 3.98 MIL/uL (ref 3.87–5.11)
RDW: 16.4 % — ABNORMAL HIGH (ref 11.5–15.5)
WBC: 9.7 10*3/uL (ref 4.0–10.5)

## 2014-03-25 LAB — URINALYSIS, ROUTINE W REFLEX MICROSCOPIC
Bilirubin Urine: NEGATIVE
GLUCOSE, UA: NEGATIVE mg/dL
Ketones, ur: NEGATIVE mg/dL
LEUKOCYTES UA: NEGATIVE
Nitrite: NEGATIVE
PH: 6 (ref 5.0–8.0)
PROTEIN: NEGATIVE mg/dL
Specific Gravity, Urine: 1.015 (ref 1.005–1.030)
Urobilinogen, UA: 1 mg/dL (ref 0.0–1.0)

## 2014-03-25 LAB — URINE MICROSCOPIC-ADD ON

## 2014-03-25 LAB — I-STAT TROPONIN, ED: Troponin i, poc: 0.01 ng/mL (ref 0.00–0.08)

## 2014-03-25 MED ORDER — IBUPROFEN 400 MG PO TABS: 400.0000 mg | ORAL_TABLET | Freq: Three times a day (TID) | ORAL | Status: DC | PRN

## 2014-03-25 MED ORDER — HYDROCODONE-ACETAMINOPHEN 5-325 MG PO TABS
1.0000 | ORAL_TABLET | Freq: Once | ORAL | Status: AC
  Administered 2014-03-25: 1 via ORAL
  Filled 2014-03-25: qty 1

## 2014-03-25 MED ORDER — LACTATED RINGERS IV SOLN
INTRAVENOUS | Status: DC
  Administered 2014-03-25: 1000 mL via INTRAVENOUS

## 2014-03-25 NOTE — ED Notes (Signed)
Vaginal bleeding x 1.5 weeks reports large quantities.  She has also been having intermittent chest pain for a few weeks. Postmenopausal.  CABG in February 2015.   More concerned with the vaginal bleeding.

## 2014-03-25 NOTE — ED Notes (Signed)
Phlebotomy at bedside.

## 2014-03-25 NOTE — MAU Note (Signed)
Patient states she had not had a period for 11 years until last year she started bleeding. Was seen at the [facility] and put on medication for 6 months. Has had multiple medical complications and has not been able to be seen. Has an appointment this Friday but the bleeding is to heavy. States she started bleeding heavy and passing fist size clots and some smaller about 1 1/2 weeks ago. States she feels weak. Having some abdominal cramping, sore and swollen breasts.

## 2014-03-25 NOTE — ED Provider Notes (Signed)
Medical screening examination/treatment/procedure(s) were performed by non-physician practitioner and as supervising physician I was immediately available for consultation/collaboration.   EKG Interpretation None       Date: 03/25/2014  Rate: 88  Rhythm: normal sinus rhythm and premature ventricular contractions (PVC)  QRS Axis: normal  Intervals: normal  ST/T Wave abnormalities: nonspecific ST/T changes  Conduction Disutrbances:none  Narrative Interpretation:   Old EKG Reviewed: unchanged    Malvin Johns, MD 03/25/14 2355

## 2014-03-25 NOTE — MAU Provider Note (Signed)
History     CSN: [id number]  Arrival date and time: 03/25/14 1194   First Provider Initiated Contact with Patient 03/25/14 2013      Chief Complaint  Patient presents with  . Vaginal Bleeding   HPI  [name] is a 58 y.o. [id number] who presents today with vaginal bleeding. She also reports that over the last four days she has had worsening chest pain. She states that she was in cardiac rehab today and was told that she had an arrhthymia. She states that they were going to call the cardiac surgeon to update them, but she has not heard back. She had CABG x 5 on 01/09/14.   She is also here for vaginal bleeding. She states that she had been bleeding for a few days and passing large clots. She has been seen on [facility] for bleeding similar to this. She was last seen our clinic on 07/07/14, and was advised to continue with daily provera, and to FU in 3 months. She did not have FU as planned.   Past Medical History  Diagnosis Date  . Seasonal allergies   . Asthma     History of  . Hypertension   . Iron deficiency 12-07-2011  . Anemia   . GERD (gastroesophageal reflux disease)   . Heart murmur   . Osteoporosis   . PONV (postoperative nausea and vomiting)   . Dysrhythmia     occ palpitations   . Sleep apnea     no CPAP machine; sleep study 02/2010 REM AHI 61.7/hr, total sleep REM 14.8/hr  . Headache(784.0)     occasional   . Osteoarthritis   . Morbid obesity   . Acute urinary retention s/p Foley 01/07/2012  . Hemorrhoids, internal, with bleeding & prolapse 12/13/2011  . Myocardial infarction     unsure when; per cardiologist report  . Chronic headaches   . Coronary artery disease   . Anginal pain   . Dyslipidemia   . S/P CABG (coronary artery bypass graft)     x5 - LIMA to LAD, SVG to diagonal, SVG to OM1, SVG to OM2, SVG to PDA (Dr. Prescott Gum)  . Postmenopausal bleeding     Past Surgical History  Procedure Laterality Date  . Knee surgery Bilateral 1999  . Total knee  arthroplasty Left 04/29/2011  . Multiple tooth extractions    . Wisdom tooth extraction    . Colonoscopy  2009  . Knee arthroscopy  1991  . Transanal hemorrhoidal dearterliaization  01/06/12    with external hemorrhoid removal  . Coronary artery bypass graft N/A 01/09/2014    Procedure: CORONARY ARTERY BYPASS GRAFTING (CABG) x 5 using left internal mammary artery and right leg greater saphenous vein harvested endoscopically;  Surgeon: Ivin Poot, MD;  Location: [place];  Service: Open Heart Surgery;  Laterality: N/A;  please use bed extenders and breast binder  . Intraoperative transesophageal echocardiogram N/A 01/09/2014    Procedure: INTRAOPERATIVE TRANSESOPHAGEAL ECHOCARDIOGRAM;  Surgeon: Ivin Poot, MD;  Location: [place];  Service: Open Heart Surgery;  Laterality: N/A;  . Tee without cardioversion  01/2010    EF 60-65%, small, flat, non-infiltrating, calcified, fixed apical/septal mass  . Nm myocar perf wall motion  01/2010    dipyridamole myoview - moderate perfusion defect in basal inferoseptal, basal inferior, mid inferoseptal, mid inferior, apical inferior region; EF 56%    Family History  Problem Relation Age of Onset  . Colon cancer Neg Hx   . Hypertension  Sister   . Heart failure Sister   . Heart disease Sister   . Diabetes type II Sister   . Kidney disease Brother     hypertension  . Heart attack Mother 35  . Heart disease Mother   . Hypertension Mother   . Diabetes Mother   . Hypertension Child     History  Substance Use Topics  . Smoking status: Never Smoker   . Smokeless tobacco: Never Used  . Alcohol Use: No    Allergies:  Allergies  Allergen Reactions  . Cephalosporins Anaphylaxis    Tongue swelling, gum pain  . Codeine Other (See Comments)    hallucinations  . Latex Itching  . Penicillins Other (See Comments)    Childhood allergy but pt thinks she is no longer allergic  . Prednisone Other (See Comments)    Heart beating fast        Prescriptions prior to admission  Medication Sig Dispense Refill  . aspirin 81 MG tablet Take 81 mg by mouth daily.      . cycloSPORINE (RESTASIS) 0.05 % ophthalmic emulsion Place 1 drop into both eyes 2 (two) times daily.      . furosemide (LASIX) 40 MG tablet Take 40 mg by mouth daily.      Marland Kitchen ibuprofen (ADVIL,MOTRIN) 400 MG tablet Take 400 mg by mouth every 8 (eight) hours as needed.       Marland Kitchen lisinopril (PRINIVIL,ZESTRIL) 2.5 MG tablet Take 1 tablet (2.5 mg total) by mouth daily.  30 tablet  6  . metoprolol tartrate (LOPRESSOR) 25 MG tablet Take 25-50 mg by mouth 2 (two) times daily. 50mg  AM 25mg  PM      . rosuvastatin (CRESTOR) 20 MG tablet Take 1 tablet (20 mg total) by mouth daily.  28 tablet  0  . albuterol (PROVENTIL HFA;VENTOLIN HFA) 108 (90 BASE) MCG/ACT inhaler Inhale 2 puffs into the lungs every 6 (six) hours as needed. Wheezing  3.7 g  5    ROS Physical Exam   Blood pressure 137/66, pulse 92, temperature 98.7 F (37.1 C), temperature source Oral, resp. rate 20, last menstrual period 10/26/2013, SpO2 98.00%.  Physical Exam  Nursing note and vitals reviewed. Constitutional: She is oriented to person, place, and time. She appears well-developed and well-nourished. No distress.  Cardiovascular: Normal rate.   Respiratory: Effort normal. No respiratory distress.  GI: Soft. There is no tenderness.  Neurological: She is alert and oriented to person, place, and time.  Skin: Skin is warm and dry.  Psychiatric: She has a normal mood and affect.    MAU Course  Procedures  Results for orders placed during the hospital encounter of 03/25/14 (from the past 24 hour(s))  URINALYSIS, ROUTINE W REFLEX MICROSCOPIC     Status: Abnormal   Collection Time    03/25/14  7:25 PM      Result Value Ref Range   Color, Urine YELLOW  YELLOW   APPearance CLEAR  CLEAR   Specific Gravity, Urine 1.015  1.005 - 1.030   pH 6.0  5.0 - 8.0   Glucose, UA NEGATIVE  NEGATIVE mg/dL   Hgb urine  dipstick LARGE (*) NEGATIVE   Bilirubin Urine NEGATIVE  NEGATIVE   Ketones, ur NEGATIVE  NEGATIVE mg/dL   Protein, ur NEGATIVE  NEGATIVE mg/dL   Urobilinogen, UA 1.0  0.0 - 1.0 mg/dL   Nitrite NEGATIVE  NEGATIVE   Leukocytes, UA NEGATIVE  NEGATIVE  URINE MICROSCOPIC-ADD ON     Status: Abnormal  Collection Time    03/25/14  7:25 PM      Result Value Ref Range   Squamous Epithelial / LPF FEW (*) RARE   RBC / HPF 21-50  <3 RBC/hpf   Bacteria, UA RARE  RARE  CBC     Status: Abnormal   Collection Time    03/25/14  7:43 PM      Result Value Ref Range   WBC 9.7  4.0 - 10.5 K/uL   RBC 3.98  3.87 - 5.11 MIL/uL   Hemoglobin 10.3 (*) 12.0 - 15.0 g/dL   HCT 31.3 (*) 36.0 - 46.0 %   MCV 78.6  78.0 - 100.0 fL   MCH 25.9 (*) 26.0 - 34.0 pg   MCHC 32.9  30.0 - 36.0 g/dL   RDW 16.4 (*) 11.5 - 15.5 %   Platelets 271  150 - 400 K/uL   2020: C/W Dr. Christy Gentles at [facility] ED. Will transfer patient to [facility] ED.  2036: EMS here to transfer patient to [facility] ED.   Assessment and Plan   1. Chest pain    Transfer to [facility] ED for further evaluation   Vaginal bleeding Will assess at a future time HGB stable   Mathis Bud 03/25/2014, 8:15 PM

## 2014-03-25 NOTE — Discharge Instructions (Signed)
Abnormal Uterine Bleeding Abnormal uterine bleeding means bleeding from the vagina that is not your normal menstrual period. This can be:  Bleeding or spotting between periods.  Bleeding after sex (sexual intercourse).  Bleeding that is heavier or more than normal.  Periods that last longer than usual.  Bleeding after menopause. There are many problems that may cause this. Treatment will depend on the cause of the bleeding. Any kind of bleeding that is not normal should be reviewed by your doctor.  HOME CARE Watch your condition for any changes. These actions may lessen any discomfort you are having:  Do not use tampons or douches as told by your doctor.  Change your pads often. You should get regular pelvic exams and Pap tests. Keep all appointments for tests as told by your doctor. GET HELP IF:  You are bleeding for more than 1 week.  You feel dizzy at times. GET HELP RIGHT AWAY IF:   You pass out.  You have to change pads every 15 to 30 minutes.  You have belly pain.  You have a fever.  You become sweaty or weak.  You are passing large blood clots from the vagina.  You feel sick to your stomach (nauseous) and throw up (vomit). MAKE SURE YOU:  Understand these instructions.  Will watch your condition.  Will get help right away if you are not doing well or get worse. Document Released: 09/05/2009 Document Revised: 08/29/2013 Document Reviewed: 06/07/2013 [facility] Patient Information 2014 [place], [place].  Chest Pain (Nonspecific) Chest pain has many causes. Your pain could be caused by something serious, such as a heart attack or a blood clot in the lungs. It could also be caused by something less serious, such as a chest bruise or a virus. Follow up with your doctor. More lab tests or other studies may be needed to find the cause of your pain. Most of the time, nonspecific chest pain will improve within 2 to 3 days of rest and mild pain medicine. HOME CARE  For  chest bruises, you may put ice on the sore area for 15-20 minutes, 03-04 times a day. Do this only if it makes you feel better.  Put ice in a plastic bag.  Place a towel between the skin and the bag.  Rest for the next 2 to 3 days.  Go back to work if the pain improves.  See your doctor if the pain lasts longer than 1 to 2 weeks.  Only take medicine as told by your doctor.  Quit smoking if you smoke. GET HELP RIGHT AWAY IF:   There is more pain or pain that spreads to the arm, neck, jaw, back, or belly (abdomen).  You have shortness of breath.  You cough more than usual or cough up blood.  You have very bad back or belly pain, feel sick to your stomach (nauseous), or throw up (vomit).  You have very bad weakness.  You pass out (faint).  You have a fever. Any of these problems may be serious and may be an emergency. Do not wait to see if the problems will go away. Get medical help right away. Call your local emergency services 911 in [place] Do not drive yourself to the hospital. MAKE SURE YOU:   Understand these instructions.  Will watch this condition.  Will get help right away if you or your child is not doing well or gets worse. Document Released: 04/26/2008 Document Revised: 01/31/2012 Document Reviewed: 04/26/2008 [employer] Patient Information  2014 [place], [place]. Keep your appointments as scheduled on Wednesday, and Friday with your OB and cardiothoracic surgeon, respectively if you develop shortness of breath, nausea, sweatiness, chest pain.  It is different or concerning please return immediately to the emergency room for further evaluation

## 2014-03-25 NOTE — ED Provider Notes (Signed)
CSN: [id number]     Arrival date & time 03/25/14  1832 History   First MD Initiated Contact with Patient 03/25/14 2115     Chief Complaint  Patient presents with  . Vaginal Bleeding  . Chest Pain     (Consider location/radiation/quality/duration/timing/severity/associated sxs/prior Treatment) HPI Comments: Patient had CABG in February, 2015 since that, time.  She's had intermittent chest wall discomfort, and surgical discomfort.  She has a large breasted woman, and she does not wear a supportive brassiere.  Pain becomes worse.  She also reports, that she's had a history of endometrial biopsy a year ago, which was benign, but she's noticed, that she's had persistent, intermittent vaginal bleeding, worse in the past, week, and a half.  She has an appointment with her OB/GYN in 2 days.  She had a ultrasound in April of 2015, which showed that she had endometrial wall thickening, and would require additional biopsy. She also has an appointment with her cardiothoracic surgeon on Friday for routine postop check. She denies any shortness of breath, nausea, diaphoresis.  She, states, that she has been taking ibuprofen, or Ultram for her chest wall discomfort.  She does report that she has noticed an intermittent "fluttering in her chest that does not cause any additional symptoms.  Patient is a 58 y.o. female presenting with vaginal bleeding and chest pain. The history is provided by the patient.  Vaginal Bleeding Quality:  Typical of menses Severity:  Moderate Onset quality:  Gradual Timing:  Constant Progression:  Unchanged Chronicity:  Recurrent Menstrual history:  Postmenopausal Relieved by:  None tried Worsened by:  Nothing tried Associated symptoms: no abdominal pain, no dizziness, no fatigue and no nausea   Chest Pain Pain location:  Substernal area Pain quality: aching   Pain radiates to:  Does not radiate Pain radiates to the back: no   Pain severity:  Mild Timing:   Intermittent Progression:  Unchanged Context: breathing and movement   Context: no drug use and not eating   Relieved by:  Rest Worsened by:  Coughing, deep breathing, movement and exertion Associated symptoms: no abdominal pain, no dizziness, no fatigue and no nausea     Past Medical History  Diagnosis Date  . Seasonal allergies   . Asthma     History of  . Hypertension   . Iron deficiency 12-07-2011  . Anemia   . GERD (gastroesophageal reflux disease)   . Heart murmur   . Osteoporosis   . PONV (postoperative nausea and vomiting)   . Dysrhythmia     occ palpitations   . Sleep apnea     no CPAP machine; sleep study 02/2010 REM AHI 61.7/hr, total sleep REM 14.8/hr  . Headache(784.0)     occasional   . Osteoarthritis   . Morbid obesity   . Acute urinary retention s/p Foley 01/07/2012  . Hemorrhoids, internal, with bleeding & prolapse 12/13/2011  . Myocardial infarction     unsure when; per cardiologist report  . Chronic headaches   . Coronary artery disease   . Anginal pain   . Dyslipidemia   . S/P CABG (coronary artery bypass graft)     x5 - LIMA to LAD, SVG to diagonal, SVG to OM1, SVG to OM2, SVG to PDA (Dr. Prescott Gum)  . Postmenopausal bleeding    Past Surgical History  Procedure Laterality Date  . Knee surgery Bilateral 1999  . Total knee arthroplasty Left 04/29/2011  . Multiple tooth extractions    . Wisdom tooth  extraction    . Colonoscopy  2009  . Knee arthroscopy  1991  . Transanal hemorrhoidal dearterliaization  01/06/12    with external hemorrhoid removal  . Coronary artery bypass graft N/A 01/09/2014    Procedure: CORONARY ARTERY BYPASS GRAFTING (CABG) x 5 using left internal mammary artery and right leg greater saphenous vein harvested endoscopically;  Surgeon: Ivin Poot, MD;  Location: [facility];  Service: Open Heart Surgery;  Laterality: N/A;  please use bed extenders and breast binder  . Intraoperative transesophageal echocardiogram N/A 01/09/2014     Procedure: INTRAOPERATIVE TRANSESOPHAGEAL ECHOCARDIOGRAM;  Surgeon: Ivin Poot, MD;  Location: [facility];  Service: Open Heart Surgery;  Laterality: N/A;  . Tee without cardioversion  01/2010    EF 60-65%, small, flat, non-infiltrating, calcified, fixed apical/septal mass  . Nm myocar perf wall motion  01/2010    dipyridamole myoview - moderate perfusion defect in basal inferoseptal, basal inferior, mid inferoseptal, mid inferior, apical inferior region; EF 56%   Family History  Problem Relation Age of Onset  . Colon cancer Neg Hx   . Hypertension Sister   . Heart failure Sister   . Heart disease Sister   . Diabetes type II Sister   . Kidney disease Brother     hypertension  . Heart attack Mother 5  . Heart disease Mother   . Hypertension Mother   . Diabetes Mother   . Hypertension Child    History  Substance Use Topics  . Smoking status: Never Smoker   . Smokeless tobacco: Never Used  . Alcohol Use: No   OB History   Grav Para Term Preterm Abortions TAB SAB Ect Mult Living   2 2 2  0 0 0 0 0 0 2     Review of Systems  Constitutional: Negative for fatigue.  Cardiovascular: Positive for chest pain.  Gastrointestinal: Negative for nausea and abdominal pain.  Genitourinary: Positive for vaginal bleeding.  Neurological: Negative for dizziness.      Allergies  Cephalosporins; Codeine; Latex; Penicillins; and Prednisone  Home Medications   Prior to Admission medications   Medication Sig Start Date End Date Taking? Authorizing Provider  aspirin 81 MG tablet Take 81 mg by mouth daily.   Yes Historical Provider, MD  cycloSPORINE (RESTASIS) 0.05 % ophthalmic emulsion Place 1 drop into both eyes 2 (two) times daily.   Yes Historical Provider, MD  furosemide (LASIX) 40 MG tablet Take 40 mg by mouth daily. 02/01/14  Yes Cecilie Kicks, NP  ibuprofen (ADVIL,MOTRIN) 400 MG tablet Take 400 mg by mouth every 8 (eight) hours as needed.    Yes Historical Provider, MD  lisinopril  (PRINIVIL,ZESTRIL) 2.5 MG tablet Take 1 tablet (2.5 mg total) by mouth daily. 02/01/14  Yes Cecilie Kicks, NP  metoprolol tartrate (LOPRESSOR) 25 MG tablet Take 25-50 mg by mouth 2 (two) times daily. 50mg  AM 25mg  PM 02/01/14  Yes Cecilie Kicks, NP  rosuvastatin (CRESTOR) 20 MG tablet Take 1 tablet (20 mg total) by mouth daily. 02/27/14  Yes [name], MD  albuterol (PROVENTIL HFA;VENTOLIN HFA) 108 (90 BASE) MCG/ACT inhaler Inhale 2 puffs into the lungs every 6 (six) hours as needed. Wheezing 09/25/13   Madilyn Fireman, MD   BP 148/67  Pulse 90  Temp(Src) 98.3 F (36.8 C) (Oral)  Resp 13  SpO2 100%  LMP 10/26/2013 Physical Exam  Constitutional: She appears well-developed and well-nourished. No distress.  HENT:  Head: Normocephalic and atraumatic.  Eyes: Pupils are equal, round, and  reactive to light.  Neck: Normal range of motion.  Cardiovascular: Normal rate and regular rhythm.  Exam reveals friction rub.   No murmur heard. Pulmonary/Chest: Effort normal and breath sounds normal. She exhibits tenderness. She exhibits no crepitus and no edema.    Abdominal: Soft. She exhibits no distension.  Musculoskeletal: Normal range of motion.  Neurological: She is alert.  Skin: Skin is warm. No rash noted. No erythema.    ED Course  Procedures (including critical care time) Labs Review Labs Reviewed  CBC - Abnormal; Notable for the following:    Hemoglobin 10.3 (*)    HCT 31.3 (*)    MCH 25.9 (*)    RDW 16.4 (*)    All other components within normal limits  URINALYSIS, ROUTINE W REFLEX MICROSCOPIC - Abnormal; Notable for the following:    Hgb urine dipstick LARGE (*)    All other components within normal limits  URINE MICROSCOPIC-ADD ON - Abnormal; Notable for the following:    Squamous Epithelial / LPF FEW (*)    All other components within normal limits  I-STAT CHEM 8, ED - Abnormal; Notable for the following:    Glucose, Bld 111 (*)    Hemoglobin 10.9 (*)    HCT 32.0 (*)     All other components within normal limits  I-STAT TROPOININ, ED    Imaging Review No results found.   EKG Interpretation None     EKG normal sinus rhythm  MDM  Troponin is negative.  Patient is noted to be slightly anemic with a hemoglobin of 10.3 She has an appointment with her OB/GYN to discuss her endometrial wall thickening and vaginal bleeding.  She also has an appointment in 4, days, with a cardiothoracic surgeon Final diagnoses:  Chest pain, mid sternal  Vaginal bleeding, abnormal         Garald Balding, NP 03/25/14 2252  Garald Balding, NP 03/25/14 2256

## 2014-03-27 ENCOUNTER — Ambulatory Visit (INDEPENDENT_AMBULATORY_CARE_PROVIDER_SITE_OTHER): Payer: Self-pay | Admitting: Cardiothoracic Surgery

## 2014-03-27 ENCOUNTER — Encounter (HOSPITAL_COMMUNITY): Payer: Medicare Other

## 2014-03-27 ENCOUNTER — Ambulatory Visit
Admission: RE | Admit: 2014-03-27 | Discharge: 2014-03-27 | Disposition: A | Discharge status: Home / Self Care | Payer: Medicare Other | Source: Ambulatory Visit | Attending: Cardiothoracic Surgery | Admitting: Cardiothoracic Surgery

## 2014-03-27 ENCOUNTER — Encounter: Payer: Self-pay | Admitting: Cardiothoracic Surgery

## 2014-03-27 VITALS — BP 116/78 | HR 76 | Resp 20 | Ht 68.75 in | Wt 309.0 lb

## 2014-03-27 DIAGNOSIS — I2510 Atherosclerotic heart disease of native coronary artery without angina pectoris: Secondary | ICD-10-CM

## 2014-03-27 DIAGNOSIS — Z951 Presence of aortocoronary bypass graft: Secondary | ICD-10-CM

## 2014-03-27 NOTE — Progress Notes (Signed)
PCP is Madilyn Fireman, MD Referring Provider is Sinclair Grooms, MD  Chief Complaint  Patient presents with  . Routine Post Op    6 week f/u with CXR     HPI: Final 2 months check after multivessel CABG. Patient is morbidly obese and had a difficult and prolonged postoperative recovery. She is recovering well now and has started outpatient cardiac rehabilitation at the hospital. It has been interrupted because of some heavy vaginal bleeding for which she is undergoing evaluation and therapy. The patient denies any cardiac symptoms of angina CHF. The sternal incision is intermittently mildly sore but healing well. No pedal edema    Past Medical History  Diagnosis Date  . Seasonal allergies   . Asthma     History of  . Hypertension   . Iron deficiency 12-07-2011  . Anemia   . GERD (gastroesophageal reflux disease)   . Heart murmur   . Osteoporosis   . PONV (postoperative nausea and vomiting)   . Dysrhythmia     occ palpitations   . Sleep apnea     no CPAP machine; sleep study 02/2010 REM AHI 61.7/hr, total sleep REM 14.8/hr  . Headache(784.0)     occasional   . Osteoarthritis   . Morbid obesity   . Acute urinary retention s/p Foley 01/07/2012  . Hemorrhoids, internal, with bleeding & prolapse 12/13/2011  . Myocardial infarction     unsure when; per cardiologist report  . Chronic headaches   . Coronary artery disease   . Anginal pain   . Dyslipidemia   . S/P CABG (coronary artery bypass graft)     x5 - LIMA to LAD, SVG to diagonal, SVG to OM1, SVG to OM2, SVG to PDA (Dr. Prescott Gum)  . Postmenopausal bleeding     Past Surgical History  Procedure Laterality Date  . Knee surgery Bilateral 1999  . Total knee arthroplasty Left 04/29/2011  . Multiple tooth extractions    . Wisdom tooth extraction    . Colonoscopy  2009  . Knee arthroscopy  1991  . Transanal hemorrhoidal dearterliaization  01/06/12    with external hemorrhoid removal  . Coronary artery bypass graft N/A  01/09/2014    Procedure: CORONARY ARTERY BYPASS GRAFTING (CABG) x 5 using left internal mammary artery and right leg greater saphenous vein harvested endoscopically;  Surgeon: Ivin Poot, MD;  Location: [facility];  Service: Open Heart Surgery;  Laterality: N/A;  please use bed extenders and breast binder  . Intraoperative transesophageal echocardiogram N/A 01/09/2014    Procedure: INTRAOPERATIVE TRANSESOPHAGEAL ECHOCARDIOGRAM;  Surgeon: Ivin Poot, MD;  Location: [facility];  Service: Open Heart Surgery;  Laterality: N/A;  . Tee without cardioversion  01/2010    EF 60-65%, small, flat, non-infiltrating, calcified, fixed apical/septal mass  . Nm myocar perf wall motion  01/2010    dipyridamole myoview - moderate perfusion defect in basal inferoseptal, basal inferior, mid inferoseptal, mid inferior, apical inferior region; EF 56%    Family History  Problem Relation Age of Onset  . Colon cancer Neg Hx   . Hypertension Sister   . Heart failure Sister   . Heart disease Sister   . Diabetes type II Sister   . Kidney disease Brother     hypertension  . Heart attack Mother 44  . Heart disease Mother   . Hypertension Mother   . Diabetes Mother   . Hypertension Child     Social History History  Substance Use Topics  .  Smoking status: Never Smoker   . Smokeless tobacco: Never Used  . Alcohol Use: No    Current Outpatient Prescriptions  Medication Sig Dispense Refill  . albuterol (PROVENTIL HFA;VENTOLIN HFA) 108 (90 BASE) MCG/ACT inhaler Inhale 2 puffs into the lungs every 6 (six) hours as needed. Wheezing  3.7 g  5  . aspirin 81 MG tablet Take 81 mg by mouth daily.      . cycloSPORINE (RESTASIS) 0.05 % ophthalmic emulsion Place 1 drop into both eyes 2 (two) times daily.      . furosemide (LASIX) 40 MG tablet Take 40 mg by mouth daily.      Marland Kitchen ibuprofen (ADVIL,MOTRIN) 400 MG tablet Take 1 tablet (400 mg total) by mouth every 8 (eight) hours as needed.  30 tablet  0  . lisinopril  (PRINIVIL,ZESTRIL) 2.5 MG tablet Take 1 tablet (2.5 mg total) by mouth daily.  30 tablet  6  . metoprolol tartrate (LOPRESSOR) 25 MG tablet Take 25-50 mg by mouth 2 (two) times daily. 50mg  AM 25mg  PM      . potassium chloride (K-DUR) 10 MEQ tablet Take 10 mEq by mouth daily.      . rosuvastatin (CRESTOR) 20 MG tablet Take 1 tablet (20 mg total) by mouth daily.  28 tablet  0   No current facility-administered medications for this visit.    Allergies  Allergen Reactions  . Cephalosporins Anaphylaxis    Tongue swelling, gum pain  . Codeine Other (See Comments)    hallucinations  . Latex Itching  . Penicillins Other (See Comments)    Childhood allergy but pt thinks she is no longer allergic  . Prednisone Other (See Comments)    Heart beating fast       Review of SystemsBlood pressure and heart rate are significantly improved since her last visit weight remains slightly over 300 pounds  BP 116/78  Pulse 76  Resp 20  Ht 5' 8.75" (1.746 m)  Wt 309 lb (140.161 kg)  BMI 45.98 kg/m2  SpO2 98%  LMP 10/26/2013 Physical Exam Alert and comfortable Conjunctiva pink Lungs clear Heart rhythm regular without murmur or gallop No pedal edema  Diagnostic Tests: Chest x-ray done today is clear-results reviewed with patient  Impression: Doing well now 2 months after CABG. She will complete her outpatient hospital based cardiac rehabilitation. She will limit her lifting to 20 pounds until 3 months after surgery. I provided a prescription for Ultram for incisional discomfort.  Plan:She will be followed by her cardiology physician Dr. Debara Pickett and return  needed

## 2014-03-29 ENCOUNTER — Encounter (HOSPITAL_COMMUNITY): Payer: Medicare Other

## 2014-03-29 ENCOUNTER — Encounter: Payer: Self-pay | Admitting: Obstetrics & Gynecology

## 2014-03-29 ENCOUNTER — Ambulatory Visit (INDEPENDENT_AMBULATORY_CARE_PROVIDER_SITE_OTHER): Payer: Medicare Other | Admitting: Obstetrics & Gynecology

## 2014-03-29 ENCOUNTER — Other Ambulatory Visit (HOSPITAL_COMMUNITY)
Admission: RE | Admit: 2014-03-29 | Discharge: 2014-03-29 | Disposition: A | Discharge status: Home / Self Care | Payer: Medicare Other | Source: Ambulatory Visit | Attending: Obstetrics & Gynecology | Admitting: Obstetrics & Gynecology

## 2014-03-29 VITALS — BP 135/81 | HR 79 | Temp 98.2°F | Ht 68.0 in | Wt 306.0 lb

## 2014-03-29 DIAGNOSIS — N8502 Endometrial intraepithelial neoplasia [EIN]: Secondary | ICD-10-CM | POA: Diagnosis present

## 2014-03-29 MED ORDER — MEDROXYPROGESTERONE ACETATE 10 MG PO TABS: 20.0000 mg | ORAL_TABLET | Freq: Every day | ORAL | Status: DC

## 2014-03-29 NOTE — Patient Instructions (Signed)
Endometrial Biopsy, Care After Refer to this sheet in the next few weeks. These instructions provide you with information on caring for yourself after your procedure. Your health care provider may also give you more specific instructions. Your treatment has been planned according to current medical practices, but problems sometimes occur. Call your health care provider if you have any problems or questions after your procedure. WHAT TO EXPECT AFTER THE PROCEDURE After your procedure, it is typical to have the following:  You may have mild cramping and a small amount of vaginal bleeding for a few days after the procedure. This is normal. HOME CARE INSTRUCTIONS  Only take over-the-counter or prescription medicine as directed by your health care provider.  Do not douche, use tampons, or have sexual intercourse until your health care provider approves.  Follow your health care provider's instructions regarding any activity restrictions, such as strenuous exercise or heavy lifting. SEEK MEDICAL CARE IF:  You have heavy bleeding or bleeding longer than 2 days after the procedure.  You have bad smelling drainage from your vagina.  You have a fever and chills.  Youhave severe lower stomach (abdominal) pain. SEEK IMMEDIATE MEDICAL CARE IF:  You have severe cramps in your stomach or back.  You pass large blood clots.  Your bleeding increases.  You become weak or lightheaded, or you pass out. Document Released: 08/29/2013 Document Reviewed: 04/25/2013 [employer] Patient Information 2014 [employer]  

## 2014-03-29 NOTE — Progress Notes (Signed)
Subjective:     Patient ID: [name], female   DOB: Sep 21, 1956, 58 y.o.   MRN: [id number]  HPI Pt presents with c/o bleeding of off Provera since Jan 2015.  She was diagnosed with complex hyperplasia with atypia.  At the time of dx she was deemed to have too many medical problems to consider surgery.  She has since had a CABG.  She reports that she started bleeding 3 weeks previously and had been having huge clots.  She went to the [facility] 1 week prev but, told them she was having chest pain and was therefore referred to [facility].  Past Medical History  Diagnosis Date  . Seasonal allergies   . Asthma     History of  . Hypertension   . Iron deficiency 12-07-2011  . Anemia   . GERD (gastroesophageal reflux disease)   . Heart murmur   . Osteoporosis   . PONV (postoperative nausea and vomiting)   . Dysrhythmia     occ palpitations   . Sleep apnea     no CPAP machine; sleep study 02/2010 REM AHI 61.7/hr, total sleep REM 14.8/hr  . Headache(784.0)     occasional   . Osteoarthritis   . Morbid obesity   . Acute urinary retention s/p Foley 01/07/2012  . Hemorrhoids, internal, with bleeding & prolapse 12/13/2011  . Myocardial infarction     unsure when; per cardiologist report  . Chronic headaches   . Coronary artery disease   . Anginal pain   . Dyslipidemia   . S/P CABG (coronary artery bypass graft)     x5 - LIMA to LAD, SVG to diagonal, SVG to OM1, SVG to OM2, SVG to PDA (Dr. Prescott Gum)  . Postmenopausal bleeding    Past Surgical History  Procedure Laterality Date  . Knee surgery Bilateral 1999  . Total knee arthroplasty Left 04/29/2011  . Multiple tooth extractions    . Wisdom tooth extraction    . Colonoscopy  2009  . Knee arthroscopy  1991  . Transanal hemorrhoidal dearterliaization  01/06/12    with external hemorrhoid removal  . Coronary artery bypass graft N/A 01/09/2014    Procedure: CORONARY ARTERY BYPASS GRAFTING (CABG) x 5 using left internal mammary artery and right leg greater  saphenous vein harvested endoscopically;  Surgeon: Ivin Poot, MD;  Location: [place];  Service: Open Heart Surgery;  Laterality: N/A;  please use bed extenders and breast binder  . Intraoperative transesophageal echocardiogram N/A 01/09/2014    Procedure: INTRAOPERATIVE TRANSESOPHAGEAL ECHOCARDIOGRAM;  Surgeon: Ivin Poot, MD;  Location: [place];  Service: Open Heart Surgery;  Laterality: N/A;  . Tee without cardioversion  01/2010    EF 60-65%, small, flat, non-infiltrating, calcified, fixed apical/septal mass  . Nm myocar perf wall motion  01/2010    dipyridamole myoview - moderate perfusion defect in basal inferoseptal, basal inferior, mid inferoseptal, mid inferior, apical inferior region; EF 56%   Current Outpatient Prescriptions on File Prior to Visit  Medication Sig Dispense Refill  . cycloSPORINE (RESTASIS) 0.05 % ophthalmic emulsion Place 1 drop into both eyes 2 (two) times daily.      . furosemide (LASIX) 40 MG tablet Take 40 mg by mouth daily.      Marland Kitchen ibuprofen (ADVIL,MOTRIN) 400 MG tablet Take 1 tablet (400 mg total) by mouth every 8 (eight) hours as needed.  30 tablet  0  . lisinopril (PRINIVIL,ZESTRIL) 2.5 MG tablet Take 1 tablet (2.5 mg total) by mouth daily.  30 tablet  6  . metoprolol tartrate (LOPRESSOR) 25 MG tablet Take 25-50 mg by mouth 2 (two) times daily. 50mg  AM 25mg  PM      . potassium chloride (K-DUR) 10 MEQ tablet Take 10 mEq by mouth daily.      . rosuvastatin (CRESTOR) 20 MG tablet Take 1 tablet (20 mg total) by mouth daily.  28 tablet  0  . albuterol (PROVENTIL HFA;VENTOLIN HFA) 108 (90 BASE) MCG/ACT inhaler Inhale 2 puffs into the lungs every 6 (six) hours as needed. Wheezing  3.7 g  5  . aspirin 81 MG tablet Take 81 mg by mouth daily.       No current facility-administered medications on file prior to visit.   Allergies  Allergen Reactions  . Cephalosporins Anaphylaxis    Tongue swelling, gum pain  . Codeine Other (See Comments)    hallucinations  .  Latex Itching  . Penicillins Other (See Comments)    Childhood allergy but pt thinks she is no longer allergic  . Prednisone Other (See Comments)    Heart beating fast              Review of Systems     Objective:   Physical Exam BP 135/81  Pulse 79  Temp(Src) 98.2 F (36.8 C) (Oral)  Ht 5\' 8"  (1.727 m)  Wt 306 lb (138.801 kg)  BMI 46.54 kg/m2  LMP 10/26/2013  The indications for endometrial biopsy were reviewed.   Risks of the biopsy including cramping, bleeding, infection, uterine perforation, inadequate specimen and need for additional procedures  were discussed. The patient states she understands and agrees to undergo procedure today. Consent was signed. Time out was performed. Urine HCG was negative. A sterile speculum was placed in the patient's vagina and the cervix was prepped with Betadine. A single-toothed tenaculum was placed on the anterior lip of the cervix to stabilize it. The 3 mm pipelle was introduced into the endometrial cavity without difficulty to a depth of 9cm, and a moderate amount of tissue was obtained and sent to pathology. The instruments were removed from the patient's vagina. Minimal bleeding from the cervix was noted. The patient tolerated the procedure well.    03/08/2013 Diagnosis Endometrium, biopsy - SIMPLE AND COMPLEX ENDOMETRIAL HYPERPLASIA WITH FOCAL ATYPIA. 03/13/2013 Clinical Data: Post menopausal bleeding.  TRANSABDOMINAL AND TRANSVAGINAL ULTRASOUND OF PELVIS  Technique: Both transabdominal and transvaginal ultrasound  examinations of the pelvis were performed. Transabdominal  technique was performed for global imaging of the pelvis including  uterus, ovaries, adnexal regions, and pelvic cul-de-sac.  It was necessary to proceed with endovaginal exam following the  transabdominal exam to visualize the endometrium and ovaries.  Comparison: None.  Findings:  Uterus: 8.9 x 5.0 x 6.0 cm. No fibroids identified.  Endometrium: Double layer  thickness measures 26 mm. Somewhat  heterogeneous echogenicity of the endometrium noted but no focal  lesion visualized.  Right ovary: not directly visualized by transabdominal or  transvaginal sonography, however no adnexal mass identified.  Left ovary: not directly visualized by transabdominal or  transvaginal sonography, however no adnexal mass identified.  Other Findings: No free fluid  IMPRESSION:  1. Abnormal endometrial thickening measuring 26 mm. In the setting  of post-menopausal bleeding, endometrial sampling is indicated to  exclude carcinoma. If results are benign, sonohysterogram should  be considered for focal lesion work-up. (Ref: Radiological  Reasoning: Algorithmic Workup of Abnormal Vaginal Bleeding with  Endovaginal Sonography and Sonohysterography. AJR 2008; 191:S68-  72).  2. Nonvisualization  of ovaries, however no adnexal mass  identified.   CBC    Component Value Date/Time   WBC 9.7 03/25/2014 1943   RBC 3.98 03/25/2014 1943   HGB 10.9* 03/25/2014 2224   HCT 32.0* 03/25/2014 2224   PLT 271 03/25/2014 1943   MCV 78.6 03/25/2014 1943   MCH 25.9* 03/25/2014 1943   MCHC 32.9 03/25/2014 1943   RDW 16.4* 03/25/2014 1943   LYMPHSABS 2.2 01/31/2014 1039   MONOABS 0.5 01/31/2014 1039   EOSABS 0.5 01/31/2014 1039   BASOSABS 0.0 01/31/2014 1039        Assessment:     Complex hyperplasia with atypia prev in pt who has been off treatment. I am worried that this has progressed. I have expressed my concerns to this pt.  She understands and will f/u in 2 weeks for results.  She understands that she may need to to be referred to ,GYN ONC           Plan:     Routine post-procedure instructions were given to the patient. The patient will follow up to review the results and for further management. Provera 20mg  daily

## 2014-04-01 ENCOUNTER — Encounter (HOSPITAL_COMMUNITY): Payer: Medicare Other

## 2014-04-01 ENCOUNTER — Telehealth (HOSPITAL_COMMUNITY): Payer: Self-pay | Admitting: Internal Medicine

## 2014-04-03 ENCOUNTER — Encounter (HOSPITAL_COMMUNITY): Payer: Medicare Other

## 2014-04-05 ENCOUNTER — Encounter (HOSPITAL_COMMUNITY): Payer: Medicare Other

## 2014-04-08 ENCOUNTER — Ambulatory Visit (HOSPITAL_COMMUNITY): Payer: Self-pay | Admitting: Dentistry

## 2014-04-08 ENCOUNTER — Encounter (HOSPITAL_COMMUNITY): Payer: Self-pay | Admitting: Dentistry

## 2014-04-08 ENCOUNTER — Encounter (HOSPITAL_COMMUNITY): Admission: RE | Admit: 2014-04-08 | Payer: Medicare Other | Source: Ambulatory Visit

## 2014-04-08 VITALS — BP 121/72 | HR 77 | Temp 98.3°F

## 2014-04-08 DIAGNOSIS — K08109 Complete loss of teeth, unspecified cause, unspecified class: Secondary | ICD-10-CM

## 2014-04-08 DIAGNOSIS — Z951 Presence of aortocoronary bypass graft: Secondary | ICD-10-CM

## 2014-04-08 DIAGNOSIS — K029 Dental caries, unspecified: Secondary | ICD-10-CM

## 2014-04-08 DIAGNOSIS — I2510 Atherosclerotic heart disease of native coronary artery without angina pectoris: Secondary | ICD-10-CM

## 2014-04-08 DIAGNOSIS — K036 Deposits [accretions] on teeth: Secondary | ICD-10-CM

## 2014-04-08 DIAGNOSIS — M264 Malocclusion, unspecified: Secondary | ICD-10-CM

## 2014-04-08 DIAGNOSIS — IMO0002 Reserved for concepts with insufficient information to code with codable children: Secondary | ICD-10-CM

## 2014-04-08 DIAGNOSIS — K0401 Reversible pulpitis: Secondary | ICD-10-CM

## 2014-04-08 DIAGNOSIS — K0530 Chronic periodontitis, unspecified: Secondary | ICD-10-CM

## 2014-04-08 DIAGNOSIS — K045 Chronic apical periodontitis: Secondary | ICD-10-CM

## 2014-04-08 NOTE — Patient Instructions (Signed)
Patient is scheduled for presurgical testing appointment tomorrow at 9:45 AM. Operating room procedures scheduled for this Thursday, at 7:30 AM and [facility]. Patient was reminded to be n.p.o. after midnight on the day of surgery. Patient is aware of potential need for overnight observation due to history of sleep apnea. Dr. Enrique Sack

## 2014-04-08 NOTE — Progress Notes (Signed)
04/08/2014  Patient:            [name] Date of Birth:  Sep 14, 1956 MRN:                [id number]  BP 121/72  Pulse 77  Temp(Src) 98.3 F (36.8 C) (Oral)  LMP 10/26/2013   Past Medical History  Diagnosis Date  . Seasonal allergies   . Asthma     History of  . Hypertension   . Iron deficiency 12-07-2011  . Anemia   . GERD (gastroesophageal reflux disease)   . Heart murmur   . Osteoporosis   . PONV (postoperative nausea and vomiting)   . Dysrhythmia     occ palpitations   . Sleep apnea     no CPAP machine; sleep study 02/2010 REM AHI 61.7/hr, total sleep REM 14.8/hr  . Headache(784.0)     occasional   . Osteoarthritis   . Morbid obesity   . Acute urinary retention s/p Foley 01/07/2012  . Hemorrhoids, internal, with bleeding & prolapse 12/13/2011  . Myocardial infarction     unsure when; per cardiologist report  . Chronic headaches   . Coronary artery disease   . Anginal pain   . Dyslipidemia   . S/P CABG (coronary artery bypass graft)     x5 - LIMA to LAD, SVG to diagonal, SVG to OM1, SVG to OM2, SVG to PDA (Dr. Prescott Gum)  . Postmenopausal bleeding    Past Surgical History  Procedure Laterality Date  . Knee surgery Bilateral 1999  . Total knee arthroplasty Left 04/29/2011  . Multiple tooth extractions    . Wisdom tooth extraction    . Colonoscopy  2009  . Knee arthroscopy  1991  . Transanal hemorrhoidal dearterliaization  01/06/12    with external hemorrhoid removal  . Coronary artery bypass graft N/A 01/09/2014    Procedure: CORONARY ARTERY BYPASS GRAFTING (CABG) x 5 using left internal mammary artery and right leg greater saphenous vein harvested endoscopically;  Surgeon: Ivin Poot, MD;  Location: [place];  Service: Open Heart Surgery;  Laterality: N/A;  please use bed extenders and breast binder  . Intraoperative transesophageal echocardiogram N/A 01/09/2014    Procedure: INTRAOPERATIVE TRANSESOPHAGEAL ECHOCARDIOGRAM;  Surgeon: Ivin Poot, MD;   Location: [place];  Service: Open Heart Surgery;  Laterality: N/A;  . Tee without cardioversion  01/2010    EF 60-65%, small, flat, non-infiltrating, calcified, fixed apical/septal mass  . Nm myocar perf wall motion  01/2010    dipyridamole myoview - moderate perfusion defect in basal inferoseptal, basal inferior, mid inferoseptal, mid inferior, apical inferior region; EF 56%    Allergies  Allergen Reactions  . Cephalosporins Anaphylaxis    Tongue swelling, gum pain  . Codeine Other (See Comments)    hallucinations  . Latex Itching  . Penicillins Other (See Comments)    Childhood allergy but pt thinks she is no longer allergic  . Prednisone Other (See Comments)    Heart beating fast      . Current Outpatient Prescriptions  Medication Sig Dispense Refill  . albuterol (PROVENTIL HFA;VENTOLIN HFA) 108 (90 BASE) MCG/ACT inhaler Inhale 2 puffs into the lungs every 6 (six) hours as needed. Wheezing  3.7 g  5  . aspirin 81 MG tablet Take 81 mg by mouth daily.      . cycloSPORINE (RESTASIS) 0.05 % ophthalmic emulsion Place 1 drop into both eyes 2 (two) times daily.      . furosemide (  LASIX) 40 MG tablet Take 40 mg by mouth daily.      Marland Kitchen ibuprofen (ADVIL,MOTRIN) 400 MG tablet Take 1 tablet (400 mg total) by mouth every 8 (eight) hours as needed.  30 tablet  0  . lisinopril (PRINIVIL,ZESTRIL) 2.5 MG tablet Take 1 tablet (2.5 mg total) by mouth daily.  30 tablet  6  . medroxyPROGESTERone (PROVERA) 10 MG tablet Take 2 tablets (20 mg total) by mouth daily.  60 tablet  2  . metoprolol tartrate (LOPRESSOR) 25 MG tablet Take 25-50 mg by mouth 2 (two) times daily. 50mg  AM 25mg  PM      . potassium chloride (K-DUR) 10 MEQ tablet Take 10 mEq by mouth daily.      . rosuvastatin (CRESTOR) 20 MG tablet Take 1 tablet (20 mg total) by mouth daily.  28 tablet  0   No current facility-administered medications for this visit.   CBC    Component Value Date/Time   WBC 9.7 03/25/2014 1943   RBC 3.98 03/25/2014  1943   HGB 10.9* 03/25/2014 2224   HCT 32.0* 03/25/2014 2224   PLT 271 03/25/2014 1943   MCV 78.6 03/25/2014 1943   MCH 25.9* 03/25/2014 1943   MCHC 32.9 03/25/2014 1943   RDW 16.4* 03/25/2014 1943   LYMPHSABS 2.2 01/31/2014 1039   MONOABS 0.5 01/31/2014 1039   EOSABS 0.5 01/31/2014 1039   BASOSABS 0.0 01/31/2014 1039    BMET    Component Value Date/Time   NA 141 03/25/2014 2224   K 3.9 03/25/2014 2224   CL 98 03/25/2014 2224   CO2 31 01/31/2014 1039   GLUCOSE 111* 03/25/2014 2224   BUN 13 03/25/2014 2224   CREATININE 1.00 03/25/2014 2224   CREATININE 0.72 01/31/2014 1039   CALCIUM 9.5 01/31/2014 1039   GFRNONAA >89 01/31/2014 1039   GFRNONAA 56* 01/20/2014 0510   GFRAA >89 01/31/2014 1039   GFRAA 65* 01/20/2014 0510    Lab Results  Component Value Date   INR 1.10 01/09/2014   INR 1.05 01/04/2014   INR 1.0 03/06/2013    [name] is a 58 year old female with history of coronary artery disease and is status post five-vessel CABG on 01/09/2014 with Dr. Tharon Aquas Trigt. The patient developed acute dental pain and was seen for a consultation on 01/10/2014. Dental treatment was deferred secondary to the recent heart surgery. Patient was to contact dental medicine for an outpatient followup appointment once cleared by the heart surgeon. The patient was recently seen by Dr. Prescott Gum on 03/27/2014 and was subsequently referred for dental evaluation and treatment as indicated. The patient is currently cleared for dental treatment per Dr. Prescott Gum.  The patient is current complaining of intermittent headache symptoms coming from the tooth #8. This is the maxillary right central incisor. Patient has history of tooth pain lasting for hours at a time and being spontaneous in nature. Patient describes a sharp pain and intensity of 7/10. The tooth is not currently hurting her.  The patient was last seen by her dentist that referred her to an oral surgeon. These 6 extractions were performed by Dr. Tamela Oddi.   Patient denies having any dental complications from those dental extractions. The patient was referred by Dr. Serita Kyle who has since retired. The patient did not follow up with the dentist who is now at that office.  DENTAL EXAM: General: The patient  Is a well-developd, obese female in no acute distress. Vitals: BP 121/72  Pulse 77  Temp(Src) 98.3 F (36.8 C) (Oral)  LMP 10/26/2013 Extraoral Exam: There is no submandibular lymphadenopathy.  The patient denies having any acute TMJ Symptoms. Intraoral  Exam: Patient has normal Saliva. There is no evidence of abscess formation. The patient has a deep palatal vault. Dentition: Patient is missing tooth numbers 3, 4, 5, 12, 13, 15, 17, 18, 20, 31, and 32. Periodontal: The patient has chronic periodontitis with plaque and calculus accumulations, gingival recession, and  incipient tooth mobility. Caries:  Patient has dental caries as per dental chart  Endodontic:  The patient currently denies acute phlebitis symptoms. The patient did have previous toothache symptoms coming from tooth #8. There is incipient periapical radiolucency associated with tooth #8. C&B: There are no crown or bridge restorations. Prosthodontic: Patient denies having any partial dentures. Occlusion:  Patient has a poor occlusal scheme secondary to multiple missing teeth, supra-erupted and drifting of the unopposed teeth into the edentulous areas, and the lack replacement of the missing teeth with dental prostheses. Radiographic Interpretation: 12 periapical radiographs were obtained today. There are multiple missing teeth. There is supra-eruption and drifting of the unopposed teeth into the edentulous areas. Multiple dental caries are noted. There is incipient periapical radiolucency noted at the apex of tooth #8. Malocclusion is noted.  Assessments: 1. Coronary artery disease-status post coronary artery bypass graft procedure x 5. 2. Chronic apical periodontitis 3.  History of acute pulpitis  4. Chronic periodontitis with bone loss 5. Plaque and calculus accumulations 6. Gingival recession 7. Incipient tooth mobility 8. Multiple dental caries 9. The supra-eruption and drifting of the unopposed teeth into the edentulous area 10. Malocclusion 11. Possible need for bilateral maxillary tuberosity reductions  Plan:  1.  I discussed the risks, benefits, and complications of various treatment options in relationship to the patient's medical and dental conditions. We discussed various treatment options to include no treatment, multiple extractions alveoloplasty, pre-prosthetic surgery, periodontal therapy, dental restorations, root canal therapy, crown and bridge therapy, implant therapy, and replacement of the missing teeth as indicated after adequate healing. The patient currently wished to proceed with extraction of tooth numbers 1, 2, 8, and 16 with alveoloplasty, pre-prosthetic surgery as indicated, and gross debridement of remaining dentition in the operating room with general anesthesia on 04/11/2014 at 7:30 AM.  The patient will then followup with the general dentist of her choice for continued periodontal therapy, dental restorations, and evaluation for replacement of missing teeth as indicated after adequate healing.  The patient is aware of the potential complications to include bleeding, bruising, swelling, pain, infection, root tip fracture, mandible fracture, sinus involvement, tuberosity fracture, and nerve damage as well as complications associated with anesthesia.  Patient also is aware of other potential complications that are not necessarily mentioned above.  2. Discussion of findings with the medical team as indicated.   Lenn Cal, DDS

## 2014-04-09 ENCOUNTER — Encounter (HOSPITAL_COMMUNITY)
Admission: RE | Admit: 2014-04-09 | Discharge: 2014-04-09 | Disposition: A | Discharge status: Home / Self Care | Payer: Medicare Other | Source: Ambulatory Visit | Attending: Dentistry | Admitting: Dentistry

## 2014-04-09 ENCOUNTER — Encounter (HOSPITAL_COMMUNITY): Payer: Self-pay

## 2014-04-09 HISTORY — DX: Polyneuropathy, unspecified: G62.9

## 2014-04-09 LAB — BASIC METABOLIC PANEL
BUN: 14 mg/dL (ref 6–23)
CALCIUM: 9.5 mg/dL (ref 8.4–10.5)
CHLORIDE: 104 meq/L (ref 96–112)
CO2: 25 mEq/L (ref 19–32)
CREATININE: 0.87 mg/dL (ref 0.50–1.10)
GFR calc non Af Amer: 72 mL/min — ABNORMAL LOW (ref >90)
GFR, EST AFRICAN AMERICAN: 83 mL/min — AB (ref >90)
Glucose, Bld: 109 mg/dL — ABNORMAL HIGH (ref 70–99)
Potassium: 4 mEq/L (ref 3.7–5.3)
Sodium: 141 mEq/L (ref 137–147)

## 2014-04-09 LAB — CBC
HCT: 27.3 % — ABNORMAL LOW (ref 36.0–46.0)
Hemoglobin: 9 g/dL — ABNORMAL LOW (ref 12.0–15.0)
MCH: 25.4 pg — AB (ref 26.0–34.0)
MCHC: 33 g/dL (ref 30.0–36.0)
MCV: 77.1 fL — ABNORMAL LOW (ref 78.0–100.0)
PLATELETS: 319 10*3/uL (ref 150–400)
RBC: 3.54 MIL/uL — ABNORMAL LOW (ref 3.87–5.11)
RDW: 16.8 % — AB (ref 11.5–15.5)
WBC: 7.2 10*3/uL (ref 4.0–10.5)

## 2014-04-09 NOTE — Pre-Procedure Instructions (Signed)
[name]  04/09/2014   Your procedure is scheduled on:  Thursday  04/11/14  Report to [facility] Admitting at 530 AM.  Call this number if you have problems the morning of surgery: [phone]   Remember:   Do not eat food or drink liquids after midnight.   Take these medicines the morning of surgery with A SIP OF WATER: ALBUTEROL, EYE DROPS, NEXIUM, PROVERA, LOPRESSOR (METOPROLOL), POTASSIUM, TRAMADOL   Do not wear jewelry, make-up or nail polish.  Do not wear lotions, powders, or perfumes. You may wear deodorant.  Do not shave 48 hours prior to surgery. Men may shave face and neck.  Do not bring valuables to the hospital.  [facility] is not responsible                  for any belongings or valuables.               Contacts, dentures or bridgework may not be worn into surgery.  Leave suitcase in the car. After surgery it may be brought to your room.  For patients admitted to the hospital, discharge time is determined by your                treatment team.               Patients discharged the day of surgery will not be allowed to drive  home.  Name and phone number of your driver: FAMILY/ FRIEND???  Special Instructions:  Special Instructions: [facility] - Preparing for Surgery  Before surgery, you can play an important role.  Because skin is not sterile, your skin needs to be as free of germs as possible.  You can reduce the number of germs on you skin by washing with CHG (chlorahexidine gluconate) soap before surgery.  CHG is an antiseptic cleaner which kills germs and bonds with the skin to continue killing germs even after washing.  Please DO NOT use if you have an allergy to CHG or antibacterial soaps.  If your skin becomes reddened/irritated stop using the CHG and inform your nurse when you arrive at Short Stay.  Do not shave (including legs and underarms) for at least 48 hours prior to the first CHG shower.  You may shave your face.  Please follow these  instructions carefully:   1.  Shower with CHG Soap the night before surgery and the morning of Surgery.  2.  If you choose to wash your hair, wash your hair first as usual with your normal shampoo.  3.  After you shampoo, rinse your hair and body thoroughly to remove the Shampoo.  4.  Use CHG as you would any other liquid soap. You can apply chg directly to the skin and wash gently with scrungie or a clean washcloth.  5.  Apply the CHG Soap to your body ONLY FROM THE NECK DOWN.  Do not use on open wounds or open sores.  Avoid contact with your eyes, ears, mouth and genitals (private parts).  Wash genitals (private parts with your normal soap.  6.  Wash thoroughly, paying special attention to the area where your surgery will be performed.  7.  Thoroughly rinse your body with warm water from the neck down.  8.  DO NOT shower/wash with your normal soap after using and rinsing off the CHG Soap.  9.  Pat yourself dry with a clean towel.  10.  Wear clean pajamas.            11.  Place clean sheets on your bed the night of your first shower and do not sleep with pets.  Day of Surgery  Do not apply any lotions/deodorants the morning of surgery.  Please wear clean clothes to the hospital/surgery center.   Please read over the following fact sheets that you were given: Pain Booklet, Coughing and Deep Breathing and Surgical Site Infection Prevention

## 2014-04-09 NOTE — Progress Notes (Signed)
Anesthesia chart review:  Patient is a 58 year old female scheduled for multiple teeth extractions with alveoloplasty and gross debridement on 04/11/14 by Dr. Enrique Sack.  She is s/p CABG on 01/09/14 and CT surgery recommended delaying dental surgery for at least 2-4 weeks post-CABG.  History includes CAD s/p CABG X 5 (LIMA to LAD, SVG to DIAG, sequential SVG to OM1 and OM2, SVG to PDA) 01/09/14, post-CABG transfusion, iron deficiency anemia and on-going evaluation for post-menopausal bleeding (03/29/14 endometrial biopsy showed complex hyperplasia with atypia; started on Provera; followed by GYN Dr. Lavonia Drafts and may need GYN ONC referral in the future), palpitations, dyslipidemia, HTN, GERD, osteoporosis, OSA without CPAP, post-operative N/V.  BMI is consistent with morbid obesity.   She was last seen by CT surgeon Dr. Prescott Gum on 03/27/14 and PRN follow-up was recommended. Her last visit with cardiologist Dr. Debara Pickett was 02/27/14.  Echo on 01/05/14 showed:  - Left ventricle: Limited apical views. No gross regional wall motion abnormalities. Papillary muscle calcification noted. The cavity size was normal. Wall thickness was normal. Systolic function was normal. The estimated ejection fraction was in the range of 60% to 65%. Left ventricular diastolic function parameters were normal. - Aortic valve: Mildly calcified annulus. Mildly thickened leaflets. Transvalvular velocity was within the normal range. There was no stenosis. - Mitral valve: Calcified annulus. Trivial regurgitation. - Left atrium: The atrium was mildly dilated.  Her last stress test and cardiac cath were done in 12/2013 prior to her CABG.  Reports are in [place].  EKGs on 03/25/14 showed NSR, cannot rule out inferior infarct (age undetermined), possible anterior infarct (age undetermined)/poor r wave progression.  Two PVCs were noted on one of the EKGs.  Other than the new PVCs, the EKGs appear overall stable since at least  01/18/14.  Carotid duplex on 01/05/14 showed: Bilateral: intimal wall thickening CCA. Mild mixed plaque origin and proximal ICA and ECA. 1-39% ICA stenosis. Vertebral artery flow is antegrade.  CXR on 03/27/14 showed: Status post CABG. No acute disease.  Preoperative labs noted. H/H 9.0/27.3, down from 10.3/31.3 and 10.9/32.0 on 03/25/14. I called and spoke with patient.  She reports her vaginal bleeding is improving on Provera.  She has incisional chest pains and occasional chest pains that last only a few seconds here and there (these pains do not occur with exercise) that have been present since her CABG--not new and no new symptoms since she was just seen by Dr. Prescott Gum.  She denies SOB, dizziness, and LE edema.  Her weight is down 4 lbs.  She walks with a cane due to her back and knee pains.  Cardiac rehab is on hold while she is undergoing her evaluation for vaginal bleeding. Patient's ASA and NSAIDS are currently on hold.  She is also taking an iron with vitamin C supplement.    I discussed above with anesthesiologist Dr. Linna Caprice. She has undergone recent coronary revascularization and is having on-going evaluation for vaginal bleeding with anemia. She denies any new cardiopulmonary symptoms. Plan to check an ISTAT4 on arrival to ensure stability of her anemia.  If her HCT is > 25 and no worrisome CV/CHF symptoms then it is anticipated that she could proceed with this procedure.  [facility] Short Stay Center/Anesthesiology Phone [phone] 04/09/2014 6:24 PM

## 2014-04-10 ENCOUNTER — Encounter (HOSPITAL_COMMUNITY): Payer: Medicare Other

## 2014-04-10 MED ORDER — CLINDAMYCIN PHOSPHATE 600 MG/50ML IV SOLN
600.0000 mg | Freq: Once | INTRAVENOUS | Status: AC
  Administered 2014-04-11: 600 mg via INTRAVENOUS
  Filled 2014-04-10: qty 50

## 2014-04-11 ENCOUNTER — Encounter (HOSPITAL_COMMUNITY)
Admission: RE | Disposition: A | Discharge status: Home / Self Care | Payer: Self-pay | Source: Ambulatory Visit | Attending: Dentistry

## 2014-04-11 ENCOUNTER — Ambulatory Visit (HOSPITAL_COMMUNITY)
Admission: RE | Admit: 2014-04-11 | Discharge: 2014-04-11 | Disposition: A | Discharge status: Home / Self Care | Payer: Medicare Other | Source: Ambulatory Visit | Attending: Dentistry | Admitting: Dentistry

## 2014-04-11 ENCOUNTER — Encounter (HOSPITAL_COMMUNITY): Payer: Medicare Other | Admitting: Vascular Surgery

## 2014-04-11 ENCOUNTER — Ambulatory Visit (HOSPITAL_COMMUNITY): Payer: Medicare Other | Admitting: Certified Registered"

## 2014-04-11 DIAGNOSIS — E785 Hyperlipidemia, unspecified: Secondary | ICD-10-CM | POA: Insufficient documentation

## 2014-04-11 DIAGNOSIS — K0401 Reversible pulpitis: Secondary | ICD-10-CM | POA: Diagnosis present

## 2014-04-11 DIAGNOSIS — Z7982 Long term (current) use of aspirin: Secondary | ICD-10-CM | POA: Insufficient documentation

## 2014-04-11 DIAGNOSIS — K219 Gastro-esophageal reflux disease without esophagitis: Secondary | ICD-10-CM | POA: Insufficient documentation

## 2014-04-11 DIAGNOSIS — M2679 Other specified alveolar anomalies: Secondary | ICD-10-CM | POA: Insufficient documentation

## 2014-04-11 DIAGNOSIS — I2510 Atherosclerotic heart disease of native coronary artery without angina pectoris: Secondary | ICD-10-CM | POA: Insufficient documentation

## 2014-04-11 DIAGNOSIS — K029 Dental caries, unspecified: Secondary | ICD-10-CM | POA: Insufficient documentation

## 2014-04-11 DIAGNOSIS — J449 Chronic obstructive pulmonary disease, unspecified: Secondary | ICD-10-CM | POA: Insufficient documentation

## 2014-04-11 DIAGNOSIS — I10 Essential (primary) hypertension: Secondary | ICD-10-CM | POA: Insufficient documentation

## 2014-04-11 DIAGNOSIS — G4730 Sleep apnea, unspecified: Secondary | ICD-10-CM | POA: Insufficient documentation

## 2014-04-11 DIAGNOSIS — I252 Old myocardial infarction: Secondary | ICD-10-CM | POA: Insufficient documentation

## 2014-04-11 DIAGNOSIS — Z6841 Body Mass Index (BMI) 40.0 and over, adult: Secondary | ICD-10-CM | POA: Insufficient documentation

## 2014-04-11 DIAGNOSIS — Z79899 Other long term (current) drug therapy: Secondary | ICD-10-CM | POA: Insufficient documentation

## 2014-04-11 DIAGNOSIS — K045 Chronic apical periodontitis: Secondary | ICD-10-CM | POA: Diagnosis present

## 2014-04-11 DIAGNOSIS — K036 Deposits [accretions] on teeth: Secondary | ICD-10-CM

## 2014-04-11 DIAGNOSIS — M2607 Excessive tuberosity of jaw: Secondary | ICD-10-CM | POA: Diagnosis present

## 2014-04-11 DIAGNOSIS — K044 Acute apical periodontitis of pulpal origin: Secondary | ICD-10-CM

## 2014-04-11 DIAGNOSIS — K0530 Chronic periodontitis, unspecified: Secondary | ICD-10-CM | POA: Insufficient documentation

## 2014-04-11 DIAGNOSIS — J4489 Other specified chronic obstructive pulmonary disease: Secondary | ICD-10-CM | POA: Insufficient documentation

## 2014-04-11 DIAGNOSIS — Z951 Presence of aortocoronary bypass graft: Secondary | ICD-10-CM

## 2014-04-11 HISTORY — PX: MULTIPLE EXTRACTIONS WITH ALVEOLOPLASTY: SHX5342

## 2014-04-11 LAB — POCT I-STAT 4, (NA,K, GLUC, HGB,HCT)
GLUCOSE: 108 mg/dL — AB (ref 70–99)
HEMATOCRIT: 29 % — AB (ref 36.0–46.0)
HEMOGLOBIN: 9.9 g/dL — AB (ref 12.0–15.0)
Potassium: 3.9 mEq/L (ref 3.7–5.3)
Sodium: 142 mEq/L (ref 137–147)

## 2014-04-11 SURGERY — MULTIPLE EXTRACTION WITH ALVEOLOPLASTY
Anesthesia: General | Site: Mouth

## 2014-04-11 MED ORDER — LIDOCAINE HCL (CARDIAC) 20 MG/ML IV SOLN
INTRAVENOUS | Status: DC | PRN
  Administered 2014-04-11: 40 mg via INTRAVENOUS

## 2014-04-11 MED ORDER — HYDROMORPHONE HCL PF 1 MG/ML IJ SOLN
INTRAMUSCULAR | Status: AC
  Filled 2014-04-11: qty 1

## 2014-04-11 MED ORDER — HYDROCODONE-ACETAMINOPHEN 5-325 MG PO TABS: 1.0000 | ORAL_TABLET | Freq: Four times a day (QID) | ORAL | Status: DC | PRN

## 2014-04-11 MED ORDER — MIDAZOLAM HCL 2 MG/2ML IJ SOLN: 0.5000 mg | Freq: Once | INTRAMUSCULAR | Status: DC | PRN

## 2014-04-11 MED ORDER — ROCURONIUM BROMIDE 50 MG/5ML IV SOLN
INTRAVENOUS | Status: AC
  Filled 2014-04-11: qty 2

## 2014-04-11 MED ORDER — ACETAMINOPHEN 160 MG/5ML PO SUSP
ORAL | Status: AC
  Administered 2014-04-11: 320 mg
  Filled 2014-04-11: qty 20

## 2014-04-11 MED ORDER — OXYMETAZOLINE HCL 0.05 % NA SOLN
NASAL | Status: DC | PRN
  Administered 2014-04-11: 2 via NASAL

## 2014-04-11 MED ORDER — DEXAMETHASONE SODIUM PHOSPHATE 10 MG/ML IJ SOLN
INTRAMUSCULAR | Status: DC | PRN
  Administered 2014-04-11: 10 mg via INTRAVENOUS

## 2014-04-11 MED ORDER — BUPIVACAINE-EPINEPHRINE 0.5% -1:200000 IJ SOLN
INTRAMUSCULAR | Status: DC | PRN
  Administered 2014-04-11: 3.6 mL

## 2014-04-11 MED ORDER — HYDROMORPHONE HCL PF 1 MG/ML IJ SOLN
0.2500 mg | INTRAMUSCULAR | Status: DC | PRN
  Administered 2014-04-11: 0.5 mg via INTRAVENOUS

## 2014-04-11 MED ORDER — OXYCODONE HCL 5 MG/5ML PO SOLN
ORAL | Status: AC
  Filled 2014-04-11: qty 10

## 2014-04-11 MED ORDER — ARTIFICIAL TEARS OP OINT
TOPICAL_OINTMENT | OPHTHALMIC | Status: DC | PRN
  Administered 2014-04-11: 1 via OPHTHALMIC

## 2014-04-11 MED ORDER — OXYMETAZOLINE HCL 0.05 % NA SOLN
NASAL | Status: AC
  Filled 2014-04-11: qty 15

## 2014-04-11 MED ORDER — FENTANYL CITRATE 0.05 MG/ML IJ SOLN
INTRAMUSCULAR | Status: AC
  Filled 2014-04-11: qty 5

## 2014-04-11 MED ORDER — MIDAZOLAM HCL 2 MG/2ML IJ SOLN
INTRAMUSCULAR | Status: AC
  Filled 2014-04-11: qty 2

## 2014-04-11 MED ORDER — SCOPOLAMINE 1 MG/3DAYS TD PT72
1.0000 | MEDICATED_PATCH | TRANSDERMAL | Status: DC
  Administered 2014-04-11: 1 via TRANSDERMAL

## 2014-04-11 MED ORDER — ARTIFICIAL TEARS OP OINT
TOPICAL_OINTMENT | OPHTHALMIC | Status: AC
  Filled 2014-04-11: qty 3.5

## 2014-04-11 MED ORDER — LACTATED RINGERS IV SOLN
INTRAVENOUS | Status: DC | PRN
  Administered 2014-04-11 (×2): via INTRAVENOUS

## 2014-04-11 MED ORDER — PROPOFOL 10 MG/ML IV BOLUS
INTRAVENOUS | Status: DC | PRN
  Administered 2014-04-11: 30 mg via INTRAVENOUS
  Administered 2014-04-11: 170 mg via INTRAVENOUS

## 2014-04-11 MED ORDER — BUPIVACAINE-EPINEPHRINE (PF) 0.5% -1:200000 IJ SOLN
INTRAMUSCULAR | Status: AC
  Filled 2014-04-11: qty 3.6

## 2014-04-11 MED ORDER — WATER FOR IRRIGATION, STERILE IR SOLN
Status: DC | PRN
  Administered 2014-04-11: 1000 mL via OROMUCOSAL

## 2014-04-11 MED ORDER — NEOSTIGMINE METHYLSULFATE 10 MG/10ML IV SOLN
INTRAVENOUS | Status: DC | PRN
  Administered 2014-04-11: 5 mg via INTRAVENOUS

## 2014-04-11 MED ORDER — OXYCODONE-ACETAMINOPHEN 5-325 MG PO TABS
1.0000 | ORAL_TABLET | ORAL | Status: DC | PRN
  Administered 2014-04-11: 2 via ORAL

## 2014-04-11 MED ORDER — PHENYLEPHRINE HCL 10 MG/ML IJ SOLN
INTRAMUSCULAR | Status: DC | PRN
  Administered 2014-04-11: 40 ug via INTRAVENOUS
  Administered 2014-04-11: 80 ug via INTRAVENOUS
  Administered 2014-04-11: 40 ug via INTRAVENOUS
  Administered 2014-04-11: 80 ug via INTRAVENOUS

## 2014-04-11 MED ORDER — PROMETHAZINE HCL 25 MG/ML IJ SOLN: 6.2500 mg | INTRAMUSCULAR | Status: DC | PRN

## 2014-04-11 MED ORDER — OXYCODONE HCL 5 MG PO TABS: 5.0000 mg | ORAL_TABLET | Freq: Once | ORAL | Status: DC | PRN

## 2014-04-11 MED ORDER — LIDOCAINE HCL (CARDIAC) 20 MG/ML IV SOLN
INTRAVENOUS | Status: AC
  Filled 2014-04-11: qty 5

## 2014-04-11 MED ORDER — ONDANSETRON HCL 4 MG/2ML IJ SOLN
INTRAMUSCULAR | Status: AC
  Filled 2014-04-11: qty 2

## 2014-04-11 MED ORDER — OXYCODONE HCL 5 MG/5ML PO SOLN: 5.0000 mg | Freq: Once | ORAL | Status: DC | PRN

## 2014-04-11 MED ORDER — ALCOHOL (RUBBING) 70 % SOLN
Status: DC | PRN
  Administered 2014-04-11: 100 mL via TOPICAL

## 2014-04-11 MED ORDER — FENTANYL CITRATE 0.05 MG/ML IJ SOLN
INTRAMUSCULAR | Status: DC | PRN
  Administered 2014-04-11: 100 ug via INTRAVENOUS
  Administered 2014-04-11 (×2): 50 ug via INTRAVENOUS

## 2014-04-11 MED ORDER — SUCCINYLCHOLINE CHLORIDE 20 MG/ML IJ SOLN
INTRAMUSCULAR | Status: AC
  Filled 2014-04-11: qty 1

## 2014-04-11 MED ORDER — MORPHINE SULFATE 2 MG/ML IJ SOLN: 2.0000 mg | INTRAMUSCULAR | Status: DC | PRN

## 2014-04-11 MED ORDER — LIDOCAINE-EPINEPHRINE 2 %-1:100000 IJ SOLN
INTRAMUSCULAR | Status: DC | PRN
  Administered 2014-04-11: 7.2 mL via INTRADERMAL

## 2014-04-11 MED ORDER — ROCURONIUM BROMIDE 100 MG/10ML IV SOLN
INTRAVENOUS | Status: DC | PRN
  Administered 2014-04-11: 40 mg via INTRAVENOUS
  Administered 2014-04-11: 10 mg via INTRAVENOUS

## 2014-04-11 MED ORDER — 0.9 % SODIUM CHLORIDE (POUR BTL) OPTIME
TOPICAL | Status: DC | PRN
  Administered 2014-04-11: 1000 mL

## 2014-04-11 MED ORDER — MEPERIDINE HCL 25 MG/ML IJ SOLN: 6.2500 mg | INTRAMUSCULAR | Status: DC | PRN

## 2014-04-11 MED ORDER — PROPOFOL 10 MG/ML IV BOLUS
INTRAVENOUS | Status: AC
  Filled 2014-04-11: qty 20

## 2014-04-11 MED ORDER — GLYCOPYRROLATE 0.2 MG/ML IJ SOLN
INTRAMUSCULAR | Status: DC | PRN
  Administered 2014-04-11: .8 mg via INTRAVENOUS

## 2014-04-11 MED ORDER — HEMOSTATIC AGENTS (NO CHARGE) OPTIME
TOPICAL | Status: DC | PRN
  Administered 2014-04-11: 1 via TOPICAL

## 2014-04-11 MED ORDER — ONDANSETRON HCL 4 MG/2ML IJ SOLN
INTRAMUSCULAR | Status: DC | PRN
  Administered 2014-04-11: 4 mg via INTRAVENOUS

## 2014-04-11 MED ORDER — MIDAZOLAM HCL 5 MG/5ML IJ SOLN
INTRAMUSCULAR | Status: DC | PRN
  Administered 2014-04-11: 2 mg via INTRAVENOUS

## 2014-04-11 MED ORDER — LIDOCAINE-EPINEPHRINE 2 %-1:100000 IJ SOLN
INTRAMUSCULAR | Status: AC
  Filled 2014-04-11: qty 10.2

## 2014-04-11 MED ORDER — SCOPOLAMINE 1 MG/3DAYS TD PT72
MEDICATED_PATCH | TRANSDERMAL | Status: AC
  Filled 2014-04-11: qty 1

## 2014-04-11 SURGICAL SUPPLY — 35 items
ALCOHOL 70% 16 OZ (MISCELLANEOUS) ×2 IMPLANT
ATTRACTOMAT 16X20 MAGNETIC DRP (DRAPES) ×2 IMPLANT
BLADE 10 SAFETY STRL DISP (BLADE) ×2 IMPLANT
BLADE SURG 15 STRL LF DISP TIS (BLADE) ×3 IMPLANT
BLADE SURG 15 STRL SS (BLADE) ×3
COVER SURGICAL LIGHT HANDLE (MISCELLANEOUS) ×2 IMPLANT
GAUZE PACKING FOLDED 2  STR (GAUZE/BANDAGES/DRESSINGS) ×1
GAUZE PACKING FOLDED 2 STR (GAUZE/BANDAGES/DRESSINGS) ×1 IMPLANT
GAUZE SPONGE 4X4 16PLY XRAY LF (GAUZE/BANDAGES/DRESSINGS) ×4 IMPLANT
GLOVE BIOGEL PI IND STRL 6 (GLOVE) ×1 IMPLANT
GLOVE BIOGEL PI INDICATOR 6 (GLOVE) ×1
GLOVE SURG ORTHO 8.0 STRL STRW (GLOVE) IMPLANT
GLOVE SURG SS PI 6.0 STRL IVOR (GLOVE) ×2 IMPLANT
GLOVE SURG SS PI 8.0 STRL IVOR (GLOVE) ×2 IMPLANT
GOWN STRL REUS W/ TWL LRG LVL3 (GOWN DISPOSABLE) ×1 IMPLANT
GOWN STRL REUS W/TWL 2XL LVL3 (GOWN DISPOSABLE) ×2 IMPLANT
GOWN STRL REUS W/TWL LRG LVL3 (GOWN DISPOSABLE) ×1
HEMOSTAT SURGICEL .5X2 ABSORB (HEMOSTASIS) IMPLANT
KIT BASIN OR (CUSTOM PROCEDURE TRAY) ×2 IMPLANT
KIT ROOM TURNOVER OR (KITS) ×2 IMPLANT
MANIFOLD NEPTUNE WASTE (CANNULA) ×2 IMPLANT
NEEDLE BLUNT 16X1.5 OR ONLY (NEEDLE) ×2 IMPLANT
NS IRRIG 1000ML POUR BTL (IV SOLUTION) ×2 IMPLANT
PACK EENT II TURBAN DRAPE (CUSTOM PROCEDURE TRAY) ×2 IMPLANT
PAD ARMBOARD 7.5X6 YLW CONV (MISCELLANEOUS) ×2 IMPLANT
SPONGE SURGIFOAM ABS GEL 100 (HEMOSTASIS) ×2 IMPLANT
SPONGE SURGIFOAM ABS GEL 12-7 (HEMOSTASIS) IMPLANT
SPONGE SURGIFOAM ABS GEL SZ50 (HEMOSTASIS) IMPLANT
SUCTION FRAZIER TIP 10 FR DISP (SUCTIONS) ×2 IMPLANT
SUT CHROMIC 3 0 PS 2 (SUTURE) IMPLANT
SUT CHROMIC 4 0 P 3 18 (SUTURE) ×2 IMPLANT
SYR 50ML SLIP (SYRINGE) ×2 IMPLANT
TOWEL OR 17X26 10 PK STRL BLUE (TOWEL DISPOSABLE) ×2 IMPLANT
TUBE CONNECTING 12X1/4 (SUCTIONS) ×2 IMPLANT
YANKAUER SUCT BULB TIP NO VENT (SUCTIONS) ×2 IMPLANT

## 2014-04-11 NOTE — H&P (Signed)
04/11/2014  Patient:            [name] Date of Birth:  Sep 26, 1956 MRN:                [id number]  BP 120/84  Temp(Src) 98.7 F (37.1 C)  Resp 20  Ht 5\' 8"  (1.727 m)  Wt 302 lb (136.986 kg)  BMI 45.93 kg/m2  SpO2 100%  LMP 10/26/2013  The patient denies acute medical or dental changes. Please see Dr. Lucianne Lei Trigt's note of 03/27/14 for use as H and P for dental OR procedure. Dr. Enrique Sack  Progress Notes    PCP is Madilyn Fireman, MD Referring Provider is Sinclair Grooms, MD    Chief Complaint   Patient presents with   .  Routine Post Op       6 week f/u with CXR         HPI: Final 2 months check after multivessel CABG. Patient is morbidly obese and had a difficult and prolonged postoperative recovery. She is recovering well now and has started outpatient cardiac rehabilitation at the hospital. It has been interrupted because of some heavy vaginal bleeding for which she is undergoing evaluation and therapy. The patient denies any cardiac symptoms of angina CHF. The sternal incision is intermittently mildly sore but healing well. No pedal edema        Past Medical History   Diagnosis  Date   .  Seasonal allergies     .  Asthma         History of   .  Hypertension     .  Iron deficiency  12-07-2011   .  Anemia     .  GERD (gastroesophageal reflux disease)     .  Heart murmur     .  Osteoporosis     .  PONV (postoperative nausea and vomiting)     .  Dysrhythmia         occ palpitations    .  Sleep apnea         no CPAP machine; sleep study 02/2010 REM AHI 61.7/hr, total sleep REM 14.8/hr   .  Headache(784.0)         occasional    .  Osteoarthritis     .  Morbid obesity     .  Acute urinary retention s/p Foley  01/07/2012   .  Hemorrhoids, internal, with bleeding & prolapse  12/13/2011   .  Myocardial infarction         unsure when; per cardiologist report   .  Chronic headaches     .  Coronary artery disease     .  Anginal pain     .  Dyslipidemia     .   S/P CABG (coronary artery bypass graft)         x5 - LIMA to LAD, SVG to diagonal, SVG to OM1, SVG to OM2, SVG to PDA (Dr. Prescott Gum)   .  Postmenopausal bleeding           Past Surgical History   Procedure  Laterality  Date   .  Knee surgery  Bilateral  1999   .  Total knee arthroplasty  Left  04/29/2011   .  Multiple tooth extractions       .  Wisdom tooth extraction       .  Colonoscopy    2009   .  Knee arthroscopy  1991   .  Transanal hemorrhoidal dearterliaization    01/06/12       with external hemorrhoid removal   .  Coronary artery bypass graft  N/A  01/09/2014       Procedure: CORONARY ARTERY BYPASS GRAFTING (CABG) x 5 using left internal mammary artery and right leg greater saphenous vein harvested endoscopically;  Surgeon: Ivin Poot, MD;  Location: [facility];  Service: Open Heart Surgery;  Laterality: N/A;  please use bed extenders and breast binder   .  Intraoperative transesophageal echocardiogram  N/A  01/09/2014       Procedure: INTRAOPERATIVE TRANSESOPHAGEAL ECHOCARDIOGRAM;  Surgeon: Ivin Poot, MD; Location: [facility];  Service: Open Heart Surgery;  Laterality: N/A;   .  Tee without cardioversion    01/2010       EF 60-65%, small, flat, non-infiltrating, calcified, fixed apical/septal mass   .  Nm myocar perf wall motion    01/2010       dipyridamole myoview - moderate perfusion defect in basal inferoseptal, basal inferior, mid inferoseptal, mid inferior, apical inferior region; EF 56%         Family History   Problem  Relation  Age of Onset   .  Colon cancer  Neg Hx     .  Hypertension  Sister     .  Heart failure  Sister     .  Heart disease  Sister     .  Diabetes type II  Sister     .  Kidney disease  Brother         hypertension   .  Heart attack  Mother  93   .  Heart disease  Mother     .  Hypertension  Mother     .  Diabetes  Mother     .  Hypertension  Child          Social History History   Substance Use Topics   .  Smoking status:  Never  Smoker    .  Smokeless tobacco:  Never Used   .  Alcohol Use:  No         Current Outpatient Prescriptions   Medication  Sig  Dispense  Refill   .  albuterol (PROVENTIL HFA;VENTOLIN HFA) 108 (90 BASE) MCG/ACT inhaler  Inhale 2 puffs into the lungs every 6 (six) hours as needed. Wheezing   3.7 g   5   .  aspirin 81 MG tablet  Take 81 mg by mouth daily.         .  cycloSPORINE (RESTASIS) 0.05 % ophthalmic emulsion  Place 1 drop into both eyes 2 (two) times daily.         .  furosemide (LASIX) 40 MG tablet  Take 40 mg by mouth daily.         Marland Kitchen  ibuprofen (ADVIL,MOTRIN) 400 MG tablet  Take 1 tablet (400 mg total) by mouth every 8 (eight) hours as needed.   30 tablet   0   .  lisinopril (PRINIVIL,ZESTRIL) 2.5 MG tablet  Take 1 tablet (2.5 mg total) by mouth daily.   30 tablet   6   .  metoprolol tartrate (LOPRESSOR) 25 MG tablet  Take 25-50 mg by mouth 2 (two) times daily. 50mg  AM 25mg  PM         .  potassium chloride (K-DUR) 10 MEQ tablet  Take 10 mEq by mouth daily.         Marland Kitchen  rosuvastatin (CRESTOR) 20 MG tablet  Take 1 tablet (20 mg total) by mouth daily.   28 tablet   0       No current facility-administered medications for this visit.         Allergies   Allergen  Reactions   .  Cephalosporins  Anaphylaxis       Tongue swelling, gum pain   .  Codeine  Other (See Comments)       hallucinations   .  Latex  Itching   .  Penicillins  Other (See Comments)       Childhood allergy but pt thinks she is no longer allergic   .  Prednisone  Other (See Comments)       Heart beating fast             Review of SystemsBlood pressure and heart rate are significantly improved since her last visit weight remains slightly over 300 pounds   BP 116/78  Pulse 76  Resp 20  Ht 5' 8.75" (1.746 m)  Wt 309 lb (140.161 kg)  BMI 45.98 kg/m2  SpO2 98%  LMP 10/26/2013 Physical Exam Alert and comfortable Conjunctiva pink Lungs clear Heart rhythm regular without murmur or gallop No  pedal edema   Diagnostic Tests: Chest x-ray done today is clear-results reviewed with patient   Impression: Doing well now 2 months after CABG. She will complete her outpatient hospital based cardiac rehabilitation. She will limit her lifting to 20 pounds until 3 months after surgery. I provided a prescription for Ultram for incisional discomfort.   Plan:She will be followed by her cardiology physician Dr. Debara Pickett and return  needed        Dr. Prescott Gum

## 2014-04-11 NOTE — Anesthesia Preprocedure Evaluation (Addendum)
Anesthesia Evaluation  Patient identified by MRN, date of birth, ID band Patient awake    Reviewed: Allergy & Precautions, H&P , NPO status , Patient's Chart, lab work & pertinent test results, reviewed documented beta blocker date and time   History of Anesthesia Complications (+) PONV and history of anesthetic complications  Airway Mallampati: I TM Distance: >3 FB Neck ROM: Full    Dental  (+) Poor Dentition, Loose, Dental Advisory Given, Missing, Chipped   Pulmonary asthma , sleep apnea (does not use CPAP) , COPD COPD inhaler,  breath sounds clear to auscultation        Cardiovascular hypertension, Pt. on medications and Pt. on home beta blockers + CAD, + Past MI and + CABG (2/15 CABG x5) Rhythm:Regular Rate:Normal  2/15 ECHO: EF 55-65%, valves OK   Neuro/Psych  Headaches,    GI/Hepatic Neg liver ROS, GERD-  Medicated and Controlled,  Endo/Other  Morbid obesity  Renal/GU negative Renal ROS     Musculoskeletal   Abdominal (+) + obese,   Peds  Hematology  (+) Blood dyscrasia (Hb 9.9), anemia ,   Anesthesia Other Findings   Reproductive/Obstetrics                          Anesthesia Physical Anesthesia Plan  ASA: III  Anesthesia Plan: General   Post-op Pain Management:    Induction: Intravenous  Airway Management Planned: Nasal ETT  Additional Equipment:   Intra-op Plan:   Post-operative Plan: Extubation in OR  Informed Consent: I have reviewed the patients History and Physical, chart, labs and discussed the procedure including the risks, benefits and alternatives for the proposed anesthesia with the patient or authorized representative who has indicated his/her understanding and acceptance.   Dental advisory given  Plan Discussed with: CRNA and Surgeon  Anesthesia Plan Comments: (Plan routine monitors, GETA )        Anesthesia Quick Evaluation

## 2014-04-11 NOTE — Anesthesia Procedure Notes (Signed)
Procedure Name: Intubation Date/Time: 04/11/2014 7:44 AM Performed by: Gaylene Brooks Pre-anesthesia Checklist: Patient identified, Timeout performed, Emergency Drugs available, Suction available and Patient being monitored Patient Re-evaluated:Patient Re-evaluated prior to inductionOxygen Delivery Method: Circle system utilized Preoxygenation: Pre-oxygenation with 100% oxygen Intubation Type: IV induction Ventilation: Mask ventilation without difficulty and Nasal airway inserted- appropriate to patient size Laryngoscope Size: Sabra Heck and 2 Grade View: Grade II Nasal Tubes: Left, Magill forceps- large, utilized, Nasal Rae and Nasal prep performed Tube size: 7.0 mm Number of attempts: 2 Placement Confirmation: ETT inserted through vocal cords under direct vision,  positive ETCO2,  CO2 detector and breath sounds checked- equal and bilateral Tube secured with: Tape Dental Injury: Teeth and Oropharynx as per pre-operative assessment  Comments: Left nare prepped with afrin and lubricated nasal trumpets. The glidescope was used on the first attempt, but the ETT was unable to be passed through the cords, despite a good view. On the second attempt, a Sabra Heck 2 was utilized with a grade 2 view. The ETT was passed through the cords with the assistance of magills forceps. +ETCO2, BBS=.

## 2014-04-11 NOTE — Anesthesia Postprocedure Evaluation (Signed)
  Anesthesia Post-op Note  Patient: [name]  Procedure(s) Performed: Procedure(s): Extraction of tooth #'s 1,2,8,16 with alveoloplasty, maxillary tuberosity reductions, and gross debridement of remaining teeth. (N/A)  Patient Location: PACU  Anesthesia Type:General  Level of Consciousness: awake, alert , oriented and patient cooperative  Airway and Oxygen Therapy: Patient Spontanous Breathing  Post-op Pain: none  Post-op Assessment: Post-op Vital signs reviewed, Patient's Cardiovascular Status Stable, Respiratory Function Stable, Patent Airway, No signs of Nausea or vomiting and Pain level controlled  Post-op Vital Signs: Reviewed and stable  Last Vitals:  Filed Vitals:   04/11/14 1023  BP: 154/77  Pulse:   Temp:   Resp:     Complications: No apparent anesthesia complications

## 2014-04-11 NOTE — Op Note (Signed)
Patient:            [name] Date of Birth:  [birth year] MRN:                [id number]   DATE OF PROCEDURE:  04/11/2014               OPERATIVE REPORT   PREOPERATIVE DIAGNOSES: 1. Coronary artery disease-status post coronary artery bypass graft procedure 2. Chronic apical periodontitis 3. History of acute pulpitis 4. Chronic periodontitis 5. Accretions 6. Dental caries 7. Excessive maxillary tuberosoties  POSTOPERATIVE DIAGNOSES: 1. Coronary artery disease-status post coronary artery bypass graft procedure 2. Chronic apical periodontitis 3. History of acute pulpitis 4. Chronic periodontitis 5. Accretions 6. Dental caries 7. Excessive maxillary tuberosoties  OPERATIONS: 1. Multiple extraction of tooth numbers 1, 2, 8, 16 2. 2 Quadrants of alveoloplasty 3. Gross debridement of remaining dentition 4. Bilateral maxillary osseus tuberosity reductions   SURGEON: Lenn Cal, DDS  ASSISTANT: Camie Patience, (dental assistant)  ANESTHESIA: General anesthesia via nasoendotracheal tube.  MEDICATIONS: 1. clindamycin 600 mg IV prior to invasive dental procedures. 2. Local anesthesia with a total utilization of 4 carpules each containing 34 mg of lidocaine with 0.017 mg of epinephrine as well as 2 carpules each containing 9 mg of bupivacaine with 0.009 mg of epinephrine.  SPECIMENS: There are 4 teeth that were discarded.  DRAINS: None  CULTURES: None  COMPLICATIONS: None   ESTIMATED BLOOD LOSS: 75 mLs.  INTRAVENOUS FLUIDS: 1400 mLs of Lactated ringers solution.  INDICATIONS: The patient was previously diagnosed with coronary artery disease. Patient is status post coronary artery bypass graft procedure x 5. Patient had a history of acute pulpitis symptoms.   A dental consultation was then requested to evaluate history of dental pain and to provide treatment as indicated.  The patient was examined and treatment planned for multiple extraction of tooth numbers 1, 2,  8, and 16 with alveoloplasty and pre-prosthetic surgery as needed along with gross debridement of remaining dentition.  This treatment plan was formulated to decrease the risks and complications associated with dental infection from further affecting the patient's systemic health.  OPERATIVE FINDINGS: Patient was examined operating room number 7.  The teeth were identified for extraction. The patient was noted to be affected by chronic periodontitis, accretions, history of acute pulpitis, chronic apical periodontitis, excessive maxillary tuberosities, and dental caries.   DESCRIPTION OF PROCEDURE: Patient was brought to the main operating room number 7. Patient was then placed in the supine position on the operating table. General anesthesia was then induced per the anesthesia team. The patient was then prepped and draped in the usual manner for dental medicine procedure. A timeout was performed. The patient was identified and procedures were verified. A throat pack was placed at this time. The oral cavity was then thoroughly examined with the findings noted above. The patient was then ready for dental medicine procedure as follows:  Local anesthesia was then administered sequentially with a total utilization of 4 carpules each containing 34 mg of lidocaine with 0.017 mg of epinephrine as well as 2 carpules  each containing 9 mg bupivacaine with 0.009 mg of epinephrine.  The Maxillary left and right quadrants were first approached. Anesthesia was then delivered utilizing infiltration with lidocaine with epinephrine. A #15 blade incision was then made from the maxillary right tuberosity and extended to the distal of #6.  A  surgical flap was then carefully reflected. Appropriate amounts of buccal and interseptal bone were  then removed utilizing a surgical handpiece and bur and copious amounts of sterile water tooth numbers 1 and 2.  The teeth were then subluxated with a series of straight elevators. Tooth  numbers 1 and 2 were then removed with a 53R forceps without complications.  Alveoloplasty was then performed utilizing a ronguers and bone file. At this point time an osseus tuberosity reduction was performed utilizing a rongeurs and bone file. Excessive soft tissues removed with a series of 15 blade incisions. The occlusal plane was then evaluated and adjusted as needed with further soft tissue reduction. The surgical site was then irrigated with copious amounts of sterile saline. The tissues were approximated and trimmed appropriately. The surgical site was then closed from the maxillary right tuberosity and extended to the distal of #6 utilizing 3-0 chromic gut suture in a continuous suture technique x1.  The maxillary anterior area #8 was then approached. A 15 blade incision was made from the mesial #9 and extended to the mesial #10. A sulcular flap was then reflected around tooth #8. At this point time the coronal aspect was removed secondary to gross extensive caries. This left the root remaining. Further bone was then carefully removed around tooth #8. The retained root was then removed along with the fractured facial plate of bone. Alveoloplasty was then performed utilizing a rongeur and bone file. The surgical site was then irrigated with copious of sterile saline. A piece of Surgifoam was then placed in the extraction socket. The surgical site was then closed from the mesial of #9 and extended to the mesial #10 utilizing 3-0 chromic gut suture in a continuous interrupted suture technique x1. 3 interrupted sutures were then placed utilizing 4-0 chromic gut material to further close the surgical site.  The maxillary left quadrant was then approached. A 15 blade incision was made from the maxillary left tuberosity and extended to the mesial of #14. A surgical flap was then carefully reflected. Appropriate amounts of buccal and interseptal bone was then removed around tooth #16 with a surgical handpiece  and bur and copious amounts of sterile saline. Tooth #16 was then removed with a 53L forceps without complications. Alveoloplasty was then performed utilizing a rongeur and bone file.  At this point time an osseus tuberosity reduction was performed utilizing a rongeur and bone file further. Excessive tissues were then removed utilizing a series of 15 blade incisions. The surgical site was then irrigated with copious amounts sterile saline. The tissues were approximated and trimmed appropriately. Surgical site was then closed from the maxillary left tuberosity and extended to the distal of #14 utilizing 3-0 chromic gut suture in a continuous interrupted suture technique x1.  At this point time, the remaining dentition was approached. A sonic scaler was utilized to remove significant accretions. A series of hand curettes were then utilized to further remove accretions. A sonic scaler was then again used to further refine removal of accretions. The entire mouth was then irrigated with copious amounts sterile saline.  The patient was examined for complications, seeing none, the dental medicine procedure was deemed to be complete. The throat pack was removed at this time. A series of 4 x 4 gauze were placed in the mouth to aid hemostasis. An oral airway was then placed at the request of the anesthesia team. The patient was then handed over to the anesthesia team for final disposition. After an appropriate amount of time, the patient was extubated and taken to the postanesthsia care unit with stable vital  signs and a good condition. All counts were correct for the dental medicine procedure.   Lenn Cal, DDS.

## 2014-04-11 NOTE — Discharge Instructions (Signed)

## 2014-04-11 NOTE — Transfer of Care (Signed)
Immediate Anesthesia Transfer of Care Note  Patient: [name]  Procedure(s) Performed: Procedure(s): Extraction of tooth #'s 1,2,8,16 with alveoloplasty, maxillary tuberosity reductions, and gross debridement of remaining teeth. (N/A)  Patient Location: PACU  Anesthesia Type:General  Level of Consciousness: awake, alert  and oriented  Airway & Oxygen Therapy: Patient Spontanous Breathing and Patient connected to face mask oxygen  Post-op Assessment: Report given to PACU RN, Post -op Vital signs reviewed and stable and Patient moving all extremities X 4  Post vital signs: Reviewed and stable  Complications: No apparent anesthesia complications

## 2014-04-11 NOTE — Progress Notes (Signed)
PRE-OPERATIVE NOTE:  04/11/2014 [name] [id number]  VITALS: BP 120/84  Temp(Src) 98.7 F (37.1 C)  Resp 20  Ht 5\' 8"  (1.727 m)  Wt 302 lb (136.986 kg)  BMI 45.93 kg/m2  SpO2 100%  LMP 10/26/2013  Lab Results  Component Value Date   WBC 7.2 04/09/2014   HGB 9.9* 04/11/2014   HCT 29.0* 04/11/2014   MCV 77.1* 04/09/2014   PLT 319 04/09/2014   BMET    Component Value Date/Time   NA 142 04/11/2014 0620   K 3.9 04/11/2014 0620   CL 104 04/09/2014 1106   CO2 25 04/09/2014 1106   GLUCOSE 108* 04/11/2014 0620   BUN 14 04/09/2014 1106   CREATININE 0.87 04/09/2014 1106   CREATININE 0.72 01/31/2014 1039   CALCIUM 9.5 04/09/2014 1106   GFRNONAA 72* 04/09/2014 1106   GFRNONAA >89 01/31/2014 1039   GFRAA 83* 04/09/2014 1106   GFRAA >89 01/31/2014 1039    Lab Results  Component Value Date   INR 1.10 01/09/2014   INR 1.05 01/04/2014   INR 1.0 03/06/2013   No results found for this basename: PTT     [name] presents for extractions with alveoloplasty and pre-prosthetic surgery as indicated along with gross debridement of remaining dentition the operating room with general anesthesia.    SUBJECTIVE: The patient denies any acute medical or dental changes and agrees to proceed with treatment as planned. The patient has documented history of recent vaginal bleeding. Recent hemoglobin and hematocrit were evaluated today.  EXAM: No sign of acute dental changes.  ASSESSMENT: Patient is affected by history of acute pulpitis, apical periodontitis, chronic periodontitis, accretions, excess of maxillary tuberosities and malocclusion.  PLAN: Patient agrees to proceed with treatment as planned in the operating room as previously discussed and accepts the risks, benefits, complications of the proposed treatment. Patient understands potential risk for bleeding, bruising, infection, swelling, pain, nerve damage, root tip fracture, mandible fracture, maxillary sinus involvement, fracture  tuberosities, damage to adjacent teeth to be extracted, and risks for general anesthesia. Patient is aware of the potential complications not mentioned above.   Lenn Cal, DDS

## 2014-04-12 ENCOUNTER — Encounter (HOSPITAL_COMMUNITY): Payer: Medicare Other

## 2014-04-12 ENCOUNTER — Encounter (HOSPITAL_COMMUNITY): Payer: Self-pay | Admitting: Dentistry

## 2014-04-15 ENCOUNTER — Encounter (HOSPITAL_COMMUNITY): Payer: Medicare Other

## 2014-04-16 ENCOUNTER — Telehealth: Payer: Self-pay | Admitting: General Practice

## 2014-04-16 NOTE — Telephone Encounter (Signed)
Called patient and informed her of results. Patient verbalized understanding and stated she was taking the provera daily but she is still having a lot of bleeding and feeling very weak and tired. Told patient that if her bleeding is heavy enough that she is saturating a pad in less than an hour she needs to come in to [facility]. Patient verbalized understanding and stated she would. Reminded patient of appt 5/29 @ 10. Patient verbalized understanding and had no further questions

## 2014-04-16 NOTE — Telephone Encounter (Signed)
Message copied by Shelly Coss on Tue Apr 16, 2014  2:00 PM ------      Message from: Lavonia Drafts      Created: Fri Apr 12, 2014  2:14 PM       Please call pt.  The results of endometrial biopsy 03/30/2014 were the same this visit as in the past: - [facility]            She must stay on the Provera as she is not a good surgical candidate as present (all of this was discussed with pt at her visit)...she just needs a reminder call.            Thx,      clh-S ------

## 2014-04-17 ENCOUNTER — Encounter (HOSPITAL_COMMUNITY): Payer: Medicare Other

## 2014-04-18 ENCOUNTER — Encounter: Payer: Self-pay | Admitting: *Deleted

## 2014-04-19 ENCOUNTER — Ambulatory Visit (INDEPENDENT_AMBULATORY_CARE_PROVIDER_SITE_OTHER): Payer: Medicare Other | Admitting: Obstetrics & Gynecology

## 2014-04-19 ENCOUNTER — Encounter (HOSPITAL_COMMUNITY): Admission: RE | Admit: 2014-04-19 | Payer: Medicare Other | Source: Ambulatory Visit

## 2014-04-19 ENCOUNTER — Encounter: Payer: Self-pay | Admitting: Obstetrics & Gynecology

## 2014-04-19 ENCOUNTER — Telehealth (HOSPITAL_COMMUNITY): Payer: Self-pay | Admitting: *Deleted

## 2014-04-19 VITALS — BP 108/67 | HR 72 | Ht 68.0 in | Wt 300.5 lb

## 2014-04-19 DIAGNOSIS — N8502 Endometrial intraepithelial neoplasia [EIN]: Secondary | ICD-10-CM

## 2014-04-19 DIAGNOSIS — I200 Unstable angina: Secondary | ICD-10-CM

## 2014-04-19 MED ORDER — MEGESTROL ACETATE 20 MG PO TABS: 40.0000 mg | ORAL_TABLET | Freq: Two times a day (BID) | ORAL | Status: DC

## 2014-04-19 NOTE — Progress Notes (Signed)
Patient reports heavy bleeding and clotting.

## 2014-04-19 NOTE — Telephone Encounter (Signed)
Called and inquired when she would be able to return to exercise.  Pt continues to have vaginal bleeding heavy at time- may have to have hysterectomy. Pt had teeth extraction two weeks ago.  Pt will get stitches out on Tuesday.  Pt to start new medication today to help control bleeding.  Pt is looking forward to returning to rehab on Wednesday at the earliest

## 2014-04-19 NOTE — Progress Notes (Signed)
Subjective:     Patient ID: [name], female   DOB: [birth year], 58 y.o.   MRN: [id number]  HPI Pt reports continued bleeding despite Provera.  She reports that she is passing large clots.  She recently had dental surgery for over 2 hours and was under anesthesia.  She would like to have surgery to remove uterus and stop the bleeding completely.  Pt s/p SVD x2 and has had no intraabdominal surgeries.  She is here to discuss that and surg path from her recent Endo bx.  She report that she does not like the way she feel on the provera. She denies assoc sx with the bleeding except weakness that she attributes to her post op state.   Current Outpatient Prescriptions on File Prior to Visit  Medication Sig Dispense Refill  . albuterol (PROVENTIL HFA;VENTOLIN HFA) 108 (90 BASE) MCG/ACT inhaler Inhale 2 puffs into the lungs every 6 (six) hours as needed. Wheezing  3.7 g  5  . cycloSPORINE (RESTASIS) 0.05 % ophthalmic emulsion Place 1 drop into both eyes 2 (two) times daily.      Marland Kitchen esomeprazole (NEXIUM) 40 MG capsule Take 40 mg by mouth daily at 12 noon.      . furosemide (LASIX) 40 MG tablet Take 40 mg by mouth daily.      Marland Kitchen HYDROcodone-acetaminophen (NORCO/VICODIN) 5-325 MG per tablet Take 1-2 tablets by mouth every 6 (six) hours as needed for moderate pain or severe pain.  40 tablet  0  . lisinopril (PRINIVIL,ZESTRIL) 2.5 MG tablet Take 1 tablet (2.5 mg total) by mouth daily.  30 tablet  6  . medroxyPROGESTERone (PROVERA) 10 MG tablet Take 2 tablets (20 mg total) by mouth daily.  60 tablet  2  . metoprolol tartrate (LOPRESSOR) 25 MG tablet Take 25-50 mg by mouth 2 (two) times daily. 50mg  AM 25mg  PM      . potassium chloride (K-DUR) 10 MEQ tablet Take 10 mEq by mouth daily.      . rosuvastatin (CRESTOR) 20 MG tablet Take 1 tablet (20 mg total) by mouth daily.  28 tablet  0  . aspirin 81 MG tablet Take 81 mg by mouth daily.      Marland Kitchen ibuprofen (ADVIL,MOTRIN) 400 MG tablet Take 1 tablet (400 mg total) by  mouth every 8 (eight) hours as needed.  30 tablet  0  . traMADol (ULTRAM) 50 MG tablet Take 50 mg by mouth every 6 (six) hours as needed for moderate pain.       No current facility-administered medications on file prior to visit.     Review of Systems     Objective:   Physical Exam BP 108/67  Pulse 72  Ht 5\' 8"  (1.727 m)  Wt 300 lb 8 oz (136.306 kg)  BMI 45.70 kg/m2  LMP 10/26/2013 Pt in NAD.  Exam deferred 03/29/2014 Diagnosis Endometrium, biopsy - SIMPLE AND COMPLEX HYPERPLASIA WITH ATYPIA. - SEE MICROSCOPIC DESCRIPTION.    Assessment:    After review with the pathologist he believes that her atypia is getting worse but, reports that she does not have invasive disease.  Discussed with patient definitive treatment with a hysterectomy versus attempting to continue conservative treatment.  Her anemia cannot be good for her cardiac status and definitve treatment if she is able to tolerate surgery will likely improve her overall clinical status.   Change Provera to Megace  To try to control cycles prior to procedure.  Pt made aware that she MUST have  clearance from Cardiology and her primary care physician prior to surgery       Plan:     Megace 40 mg q day Patient desires surgical management with [facility].  The risks of surgery were discussed in detail with the patient including but not limited to: bleeding which may require transfusion or reoperation; infection which may require prolonged hospitalization or re-hospitalization and antibiotic therapy; injury to bowel, bladder, ureters and major vessels or other surrounding organs; need for additional procedures including laparotomy; thromboembolic phenomenon, incisional problems and other postoperative or anesthesia complications.  Patient was told that the likelihood that her condition and symptoms will be treated effectively with this surgical management was very high; the postoperative expectations were also discussed in detail. The patient  also understands the alternative treatment options which were discussed in full. All questions were answered.  She was told that she will be contacted by our surgical scheduler regarding the time and date of her surgery; routine preoperative instructions of having nothing to eat or drink after midnight on the day prior to surgery and also coming to the hospital 1 1/2 hours prior to her time of surgery were also emphasized.  She was told she may be called for a preoperative appointment about a week prior to surgery and will be given further preoperative instructions at that visit. Printed patient education handouts about the procedure were given to the patient to review at home.

## 2014-04-22 ENCOUNTER — Encounter (HOSPITAL_COMMUNITY): Payer: Medicare Other

## 2014-04-23 ENCOUNTER — Encounter (HOSPITAL_COMMUNITY): Payer: Self-pay | Admitting: Dentistry

## 2014-04-23 ENCOUNTER — Telehealth: Payer: Self-pay

## 2014-04-23 ENCOUNTER — Ambulatory Visit (HOSPITAL_COMMUNITY): Payer: Self-pay | Admitting: Dentistry

## 2014-04-23 VITALS — BP 121/83 | HR 71 | Temp 97.9°F

## 2014-04-23 DIAGNOSIS — K08199 Complete loss of teeth due to other specified cause, unspecified class: Secondary | ICD-10-CM

## 2014-04-23 DIAGNOSIS — Z951 Presence of aortocoronary bypass graft: Secondary | ICD-10-CM

## 2014-04-23 DIAGNOSIS — K08109 Complete loss of teeth, unspecified cause, unspecified class: Secondary | ICD-10-CM

## 2014-04-23 DIAGNOSIS — K029 Dental caries, unspecified: Secondary | ICD-10-CM

## 2014-04-23 DIAGNOSIS — I2510 Atherosclerotic heart disease of native coronary artery without angina pectoris: Secondary | ICD-10-CM

## 2014-04-23 NOTE — Telephone Encounter (Signed)
Patient called stating she has a scheduled surgery and needs to have a clearance letter faxed to her PCP.

## 2014-04-23 NOTE — Patient Instructions (Signed)
PLAN: 1. Continue saltwater rinses as needed to aid healing. 2. Brush teeth after meals and at bedtime 3. Followup with her primary dentist, Dr. Mellissa Kohut, to perform dental restoration of tooth numbers 19, 21, and 28 evaluation for replacement of missing teeth as indicated. Release of information was obtained to send records to Dr. Sabino Gasser. 4. Evaluate need for antibiotic premedication prior to invasive dental procedures due to history of total knee replacement on the left side. Dr. Adriana Mccallum is the orthopedic surgeon. 5. Call if problems arise with healing.   Lenn Cal, DDS

## 2014-04-23 NOTE — Progress Notes (Signed)
POST OPERATIVE NOTE:  04/23/2014 [name] [id number]  VITALS: BP 121/83  Pulse 71  Temp(Src) 97.9 F (36.6 C) (Oral)  LMP 10/26/2013  LABS:  Lab Results  Component Value Date   WBC 7.2 04/09/2014   HGB 9.9* 04/11/2014   HCT 29.0* 04/11/2014   MCV 77.1* 04/09/2014   PLT 319 04/09/2014   BMET    Component Value Date/Time   NA 142 04/11/2014 0620   K 3.9 04/11/2014 0620   CL 104 04/09/2014 1106   CO2 25 04/09/2014 1106   GLUCOSE 108* 04/11/2014 0620   BUN 14 04/09/2014 1106   CREATININE 0.87 04/09/2014 1106   CREATININE 0.72 01/31/2014 1039   CALCIUM 9.5 04/09/2014 1106   GFRNONAA 72* 04/09/2014 1106   GFRNONAA >89 01/31/2014 1039   GFRAA 83* 04/09/2014 1106   GFRAA >89 01/31/2014 1039    Lab Results  Component Value Date   INR 1.10 01/09/2014   INR 1.05 01/04/2014   INR 1.0 03/06/2013   No results found for this basename: PTT     Patient is status post multiple extraction of tooth numbers 1, 2, 8, 16, with alveoloplasty and pre-prosthetic surgery as indicated and gross debridement of teeth in the operating room on 04/11/2014. The patient now presents for evaluation of healing and suture removal as needed.  SUBJECTIVE: Patient has minimal complaints. The patient denies having any stitches that remain.  EXAM: There is no sign of infection, heme, or ooze. Generalized primary closure is noted. No sutures remain. There is a healing defect in the area of #8 on the facial aspect consistent with difficult surgical procedure and loss of the facial plate of bone during the extraction of tooth #8. Patient has remaining caries on tooth numbers 19, 21, and 28. Patient has a defect of restoration of tooth #9. Patient has multiple missing teeth.  PROCEDURE: The patient was given a chlorhexidine gluconate rinse for 30 seconds. No sutures were present the need to be removed.   ASSESSMENT: Post operative course is consistent with dental procedures performed in the operating room with  general anesthesia.  PLAN: 1. Continue saltwater rinses as needed to aid healing. 2. Brush teeth after meals and at bedtime 3. Followup with her primary dentist, Dr. Mellissa Kohut, to perform dental restoration of tooth numbers 19, 21, and 28 evaluation for replacement of missing teeth as indicated. Release of information was obtained to send records to Dr. Sabino Gasser. 4. Evaluate need for antibiotic premedication prior to invasive dental procedures due to history of total knee replacement on the left side. Dr. Adriana Mccallum is the orthopedic surgeon. Call if problems arise with healing.   Lenn Cal, DDS

## 2014-04-24 ENCOUNTER — Ambulatory Visit: Payer: Medicare Other | Admitting: Obstetrics and Gynecology

## 2014-04-24 ENCOUNTER — Telehealth: Payer: Self-pay | Admitting: Internal Medicine

## 2014-04-24 ENCOUNTER — Encounter (HOSPITAL_COMMUNITY): Payer: Medicare Other

## 2014-04-24 NOTE — Telephone Encounter (Signed)
Having some stinging on her incision  from the CABG  and its very bad , and having some chest pains . Please Call   Thanks

## 2014-04-24 NOTE — Telephone Encounter (Signed)
RN called patient. Patient c/o chest problems where CABG incision was (CABG in Feb 2015). She describes discomfort as soreness/stinging (like a bee sting) for about 1.5 weeks. She states the symptoms radiate into neck and chin. She feels like the incision is pulling when she stands up and she has to use a warm compress and move her breasts around to obtain relief. She also c/o chest pain that is more frequent than had been previously and it "doesn't linger". It is in the center of her chest near the end of her incision. She DOES NOT have NTG but has taking vicodin which "helps a little"  Will forward to [place], [place] for advice.

## 2014-04-24 NOTE — Telephone Encounter (Signed)
Called patient and advised to Dentist, per advice from [place], [place]. Patient voiced understanding.

## 2014-04-24 NOTE — Telephone Encounter (Signed)
She needs to call her surgeon, this sounds like scar pain.  Kerin Ransom PA-C 04/24/2014 3:23 PM

## 2014-04-24 NOTE — Telephone Encounter (Signed)
Called patient and stated Dr. Ihor Dow is scheduling her for surgery however she was told she needs a clearance letter from her PCP and cardiologist. Patient stated she spoke to her PCP and they stated we would need to fax something over-- patient then stated she thinks she was confused and just needs to have an appointment with them. Informed patient that she is correct, she will need to schedule appointments with these providers so that she can be cleared prior to surgery. Patient verbalize understanding. No further questions or concerns.

## 2014-04-26 ENCOUNTER — Encounter (HOSPITAL_COMMUNITY): Payer: Medicare Other

## 2014-04-29 ENCOUNTER — Encounter (HOSPITAL_COMMUNITY): Payer: Medicare Other

## 2014-05-01 ENCOUNTER — Encounter (HOSPITAL_COMMUNITY): Payer: Medicare Other

## 2014-05-03 ENCOUNTER — Encounter (HOSPITAL_COMMUNITY): Payer: Medicare Other

## 2014-05-06 ENCOUNTER — Encounter (HOSPITAL_COMMUNITY): Payer: Medicare Other

## 2014-05-08 ENCOUNTER — Telehealth (HOSPITAL_COMMUNITY): Payer: Self-pay | Admitting: *Deleted

## 2014-05-08 ENCOUNTER — Encounter (HOSPITAL_COMMUNITY): Admission: RE | Admit: 2014-05-08 | Payer: Medicare Other | Source: Ambulatory Visit

## 2014-05-08 NOTE — Telephone Encounter (Signed)
Pt called to inform rehab staff that she could not attend due to needing surgery-hysterectomy which has not been scheduled for either this month or next month.  Due to the uncertainty of the surgery date and the expected time for recovery.  Will plan to discharge her from the program and have her reenter as a new pt when she is able to do so.  Pt is agreeable to this.  Will send pt postage paid envelope to her to mail back parking access card.  Verified address for pt.

## 2014-05-10 ENCOUNTER — Telehealth: Payer: Self-pay

## 2014-05-10 ENCOUNTER — Encounter (HOSPITAL_COMMUNITY): Payer: Medicare Other

## 2014-05-10 NOTE — Telephone Encounter (Signed)
Message copied by Geanie Logan on Fri May 10, 2014  8:03 AM ------      Message from: Excell Seltzer      Created: Thu May 09, 2014  3:26 PM      Regarding: FW: Scheduled surgery       Please contact this patient.  Let her know she must have surgical clearance for surgery.  She can see her primary Tima Curet if she has one or refer to [place] (if refer please get appointment for the patient).  Let me know once completed and who she is going to get clearance with.  Without notes for surgical clearance her surgery will get cancelled.      Thanks,      Claiborne Billings      ----- Message -----         From: [place] L Presnell         Sent: 05/06/2014   1:49 PM           To: Curly Shores, RN, Guss Bunde, MD, #      Subject: Scheduled surgery                                        Posted on 06/03/14 @ 9:00a for [facility] - cpt 58260 and diag: 626.8, 622.10 and 285.9            Claiborne Billings,  I do not see this patient has been cleared as yet....will you f/u?            Thanks            ----- Message -----         From: Lavonia Drafts, MD         Sent: 04/19/2014  10:51 AM           To: Curly Shores, RN, [place] L Presnell            Please schedule [facility].       Pt needs clearance from CARDIOLOGY and her primary physician prior to surgery!  Please arrange date then get clearance!             KR: Pt is aged and may need assistance.              THX!!!!!      clh-S             ------

## 2014-05-10 NOTE — Telephone Encounter (Signed)
Called patient who states she goes to [facility] for PCP and she has already called in an attempt to schedule an appointment. She was told her doctor's schedule was not up yet when she called a few weeks ago. Informed patient it is important for her to get that appointment set up and out of the way several weeks before surgery and advised that she call again to schedule. Patient verbalized understanding and stated she would.

## 2014-05-13 ENCOUNTER — Encounter (HOSPITAL_COMMUNITY): Payer: Medicare Other

## 2014-05-13 ENCOUNTER — Telehealth: Payer: Self-pay

## 2014-05-13 ENCOUNTER — Encounter: Payer: Self-pay | Admitting: Internal Medicine

## 2014-05-13 ENCOUNTER — Ambulatory Visit (INDEPENDENT_AMBULATORY_CARE_PROVIDER_SITE_OTHER): Payer: Medicare Other | Admitting: Internal Medicine

## 2014-05-13 VITALS — BP 136/75 | HR 81 | Temp 99.2°F | Resp 20 | Ht 68.5 in | Wt 304.3 lb

## 2014-05-13 DIAGNOSIS — Z01818 Encounter for other preprocedural examination: Secondary | ICD-10-CM | POA: Insufficient documentation

## 2014-05-13 NOTE — Progress Notes (Signed)
Subjective:    Patient ID: [name], female    DOB: 12-09-1955, 58 y.o.   MRN: [id number]  HPI [name] is a 58 yo woman pmh as listed below presents for preop clearance for vaginal hysterectomy.   Clinical Predictors (cardiac history):pt with recent CABG in 2/15 but without CHF  -Intermediate: Mild angina pectoris, prior MI, compensated or prior CHF, diabetes mellitus  Functional capacity (exercise capacity): -Intermediate  (4-6 METS): eat, dress, bathe, walk slowly on 2 level blocks, can participate in light housework, and can do light recreational activities, moderate house work (vacuuming, sweeping carrying groceries) is sometimes limited by cane   Estimated Surgical Risk: -Intermediate (Cardiac risk 1-5%): OB/GYN procedure   Past Medical History  Diagnosis Date  . Seasonal allergies   . Asthma     History of  . Hypertension   . Iron deficiency 12-07-2011  . Anemia   . GERD (gastroesophageal reflux disease)   . Heart murmur   . Osteoporosis   . PONV (postoperative nausea and vomiting)   . Dysrhythmia     occ palpitations   . Sleep apnea     no CPAP machine; sleep study 02/2010 REM AHI 61.7/hr, total sleep REM 14.8/hr  . Headache(784.0)     occasional   . Osteoarthritis   . Morbid obesity   . Acute urinary retention s/p Foley 01/07/2012  . Hemorrhoids, internal, with bleeding & prolapse 12/13/2011  . Myocardial infarction     unsure when; per cardiologist report  . Chronic headaches   . Coronary artery disease   . Anginal pain   . Dyslipidemia   . S/P CABG (coronary artery bypass graft)     x5 - LIMA to LAD, SVG to diagonal, SVG to OM1, SVG to OM2, SVG to PDA (Dr. Prescott Griffin)  . Postmenopausal bleeding   . Neuropathy   . Blood dyscrasia     ABNORMAL VAGINAL BLEEDING   Current Outpatient Prescriptions on File Prior to Visit  Medication Sig Dispense Refill  . albuterol (PROVENTIL HFA;VENTOLIN HFA) 108 (90 BASE) MCG/ACT inhaler Inhale 2 puffs into the lungs  every 6 (six) hours as needed. Wheezing  3.7 g  5  . aspirin 81 MG tablet Take 81 mg by mouth daily.      . cycloSPORINE (RESTASIS) 0.05 % ophthalmic emulsion Place 1 drop into both eyes 2 (two) times daily.      Marland Kitchen esomeprazole (NEXIUM) 40 MG capsule Take 40 mg by mouth daily at 12 noon.      . furosemide (LASIX) 40 MG tablet Take 40 mg by mouth daily.      Marland Kitchen HYDROcodone-acetaminophen (NORCO/VICODIN) 5-325 MG per tablet Take 1-2 tablets by mouth every 6 (six) hours as needed for moderate pain or severe pain.  40 tablet  0  . ibuprofen (ADVIL,MOTRIN) 400 MG tablet Take 1 tablet (400 mg total) by mouth every 8 (eight) hours as needed.  30 tablet  0  . lisinopril (PRINIVIL,ZESTRIL) 2.5 MG tablet Take 1 tablet (2.5 mg total) by mouth daily.  30 tablet  6  . medroxyPROGESTERone (PROVERA) 10 MG tablet Take 2 tablets (20 mg total) by mouth daily.  60 tablet  2  . megestrol (MEGACE) 20 MG tablet Take 2 tablets (40 mg total) by mouth 2 (two) times daily.  60 tablet  3  . metoprolol tartrate (LOPRESSOR) 25 MG tablet Take 25-50 mg by mouth 2 (two) times daily. 50mg  AM 25mg  PM      .  potassium chloride (K-DUR) 10 MEQ tablet Take 10 mEq by mouth daily.      . rosuvastatin (CRESTOR) 20 MG tablet Take 1 tablet (20 mg total) by mouth daily.  28 tablet  0  . traMADol (ULTRAM) 50 MG tablet Take 50 mg by mouth every 6 (six) hours as needed for moderate pain.       No current facility-administered medications on file prior to visit.   Review of Systems Pt. Denies hx of fevers, nightsweats, chills, headaches, vision changes, hearing changes, SOB, DOE,  abdominal pain / discomfort, chest pain / discomfort, cough, palpitations, diarrhea, constipation, musculoskeletal pain or weakness, polyuria, polydipsea, generalized weakness, or fatigue above her baseline.  Social, surgical, family history reviewed with patient and updated in appropriate chart locations.      Objective:   Physical Exam Filed Vitals:   05/13/14  1506  BP: 136/75  Pulse: 81  Temp: 99.2 F (37.3 C)  Resp: 20   General: sitting in chair, NAD HEENT: PERRL, EOMI, no scleral icterus Cardiac: RRR, no rubs, murmurs or gallops, large midline scar with some keloid reaction  Pulm: clear to auscultation bilaterally, moving normal volumes of air Abd: soft, nontender, nondistended, BS present Ext: warm and well perfused, no pedal edema Neuro: alert and oriented X3, cranial nerves II-XII grossly intact    Assessment & Plan:  Please see problem oriented charting  Pt discussed with Dr. Lynnae January

## 2014-05-13 NOTE — Telephone Encounter (Signed)
Message copied by Geanie Logan on Mon May 13, 2014  9:10 AM ------      Message from: Guss Bunde      Created: Mon May 13, 2014  9:02 AM      Regarding: FW: Scheduled surgery       Please call patient and [facility] clinic and make sure she is in the loop for clearance.  WE have very few surgery spots and must make sure they are maximized.              ----- Message -----         From: [place] L Presnell         Sent: 05/06/2014   1:49 PM           To: Curly Shores, RN, Guss Bunde, MD, #      Subject: Scheduled surgery                                        Posted on 06/03/14 @ 9:00a for [facility] - cpt 58260 and diag: 626.8, 622.10 and 285.9            Kelly,  I do not see this patient has been cleared as yet....will you f/u?            Thanks            ----- Message -----         From: Lavonia Drafts, MD         Sent: 04/19/2014  10:51 AM           To: Curly Shores, RN, [place] L Presnell            Please schedule [facility].       Pt needs clearance from CARDIOLOGY and her primary physician prior to surgery!  Please arrange date then get clearance!             KR: Pt is aged and may need assistance.              THX!!!!!      clh-S             ------

## 2014-05-13 NOTE — Telephone Encounter (Signed)
Patient called to inform clinic that she has a scheduled appointment this afternoon 05/13/14 at 1445 for surgical clearance with PCP. Patient states she will call cardiologist to schedule appointment with them for surgical clearance as well.

## 2014-05-13 NOTE — Telephone Encounter (Signed)
Called [facility] outpatient clinic [phone]) and informed patient has appointment this afternoon at 1445 for surgical clearance.

## 2014-05-13 NOTE — Patient Instructions (Signed)
General Instructions: You are all cleared for your surgery. Best of luck on your recovery.   Please bring your medicines with you each time you come to clinic.  Medicines may include prescription medications, over-the-counter medications, herbal remedies, eye drops, vitamins, or other pills.   Progress Toward Treatment Goals:  Treatment Goal 12/13/2013  Blood pressure unchanged    Self Care Goals & Plans:  Self Care Goal 05/13/2014  Manage my medications bring my medications to every visit; refill my medications on time  Monitor my health keep track of my blood pressure; keep track of my weight  Eat healthy foods eat smaller portions; eat baked foods instead of fried foods; eat foods that are low in salt; drink diet soda or water instead of juice or soda  Be physically active find an activity I enjoy  Meeting treatment goals -    No flowsheet data found.   Care Management & Community Referrals:  Referral 11/06/2013  Referrals made for care management support none needed

## 2014-05-13 NOTE — Telephone Encounter (Signed)
Called [facility] to see if patient has scheduled/to schedule appointment--- informed me patient is not a patient there and we cannot get her in as a new patient as appointments are booked out 2 months. Called patient who states she is seen at [facility] and stated they were supposed to call her today. Informed patient of the importance of having this scheduled now as her surgery will be canceled if she does not follow through with clearance appointments. Advised patient to call clinic today and informed her that I would call as well-- patient to call our clinic if she is able to get an appointment prior to hearing from me.

## 2014-05-13 NOTE — Assessment & Plan Note (Addendum)
Pt has intermediate clinical predictors. She is going for an intermediate risk procedure. Has intermediate functional Capacity at 4-6 mets. Per gupta periop cardiac index score of 1% and RCRI score of 1 indicating 1% perioperative risk. At this time there are no contraindications to surgery. The patient is therefore cleared for surgery.  -all pt labs are up todate w/in the last 6 months and all normal with a stable anemia thought to be 2/2 menorrhagia Lab Results  Component Value Date   WBC 7.2 04/09/2014   HGB 9.9* 04/11/2014   HCT 29.0* 04/11/2014   MCV 77.1* 04/09/2014   PLT 319 04/09/2014   Lab Results  Component Value Date   CREATININE 0.87 04/09/2014   Lab Results  Component Value Date   HGBA1C 5.1 01/02/2014    -no HgbA1c was drawn given all pt Bmet/cmets without CBG >200 -pt will have cards clearance given CABG in 2/15 on 05/14/14 -pt having no symptoms to warrant CXR such as cough -pt is not a smoker and doesn't use EtOH

## 2014-05-14 ENCOUNTER — Ambulatory Visit: Payer: Self-pay | Admitting: Internal Medicine

## 2014-05-14 ENCOUNTER — Telehealth: Payer: Self-pay | Admitting: Internal Medicine

## 2014-05-14 NOTE — Progress Notes (Signed)
Case discussed with Dr. Sadek soon after the resident saw the patient.  We reviewed the resident's history and exam and pertinent patient test results.  I agree with the assessment, diagnosis, and plan of care documented in the resident's note. 

## 2014-05-15 ENCOUNTER — Encounter (HOSPITAL_COMMUNITY): Payer: Medicare Other

## 2014-05-15 NOTE — Telephone Encounter (Signed)
Closed encounter °

## 2014-05-16 ENCOUNTER — Encounter: Payer: Self-pay | Admitting: Internal Medicine

## 2014-05-16 ENCOUNTER — Ambulatory Visit (INDEPENDENT_AMBULATORY_CARE_PROVIDER_SITE_OTHER): Payer: Medicare Other | Admitting: Internal Medicine

## 2014-05-16 VITALS — BP 122/64 | HR 73 | Ht 68.0 in | Wt 303.5 lb

## 2014-05-16 DIAGNOSIS — I2510 Atherosclerotic heart disease of native coronary artery without angina pectoris: Secondary | ICD-10-CM

## 2014-05-16 DIAGNOSIS — I200 Unstable angina: Secondary | ICD-10-CM

## 2014-05-16 DIAGNOSIS — I209 Angina pectoris, unspecified: Secondary | ICD-10-CM

## 2014-05-16 DIAGNOSIS — Z01818 Encounter for other preprocedural examination: Secondary | ICD-10-CM

## 2014-05-16 DIAGNOSIS — Z951 Presence of aortocoronary bypass graft: Secondary | ICD-10-CM

## 2014-05-16 DIAGNOSIS — E785 Hyperlipidemia, unspecified: Secondary | ICD-10-CM

## 2014-05-16 DIAGNOSIS — I25119 Atherosclerotic heart disease of native coronary artery with unspecified angina pectoris: Secondary | ICD-10-CM

## 2014-05-16 DIAGNOSIS — I10 Essential (primary) hypertension: Secondary | ICD-10-CM

## 2014-05-16 NOTE — Patient Instructions (Signed)
Your physician recommends that you schedule a follow-up appointment in: [name] with Dr.Hilty.

## 2014-05-17 ENCOUNTER — Encounter (HOSPITAL_COMMUNITY): Payer: Medicare Other

## 2014-05-20 ENCOUNTER — Encounter (HOSPITAL_COMMUNITY): Payer: Medicare Other

## 2014-05-20 ENCOUNTER — Encounter (HOSPITAL_COMMUNITY): Payer: Self-pay | Admitting: Pharmacy Technician

## 2014-05-21 ENCOUNTER — Encounter: Payer: Self-pay | Admitting: Internal Medicine

## 2014-05-21 NOTE — Progress Notes (Signed)
OFFICE NOTE  Chief Complaint:  Preoperative clearance  Primary Care Physician: Madilyn Fireman, MD  HPI:  [name] is a 58 year old African American obese female presented to 01/01/14 with symptoms of unstable angina. I had seen her once in 2012, but not since then.  She recently had been having chest pain and presented urgently when she became short of breath at home and had chest tightness, which started as right arm pain and right chest pain, then moved substernally. She ultimately underwent cardiac catheterization via right radial artery which demonstrated severe three-vessel CAD with graftable target vessels. Echocardiogram showed good LV function with some left ventricular hypertrophy from her hypertension and there does not appear to be significant valve disease. Carotid dopplers showed a 1-39% bilateral carotid artery stenosis. She underwent a CABG x 5 on 01/09/2014. Left internal mammary artery to LAD, saphenous vein graft to diagonal, sequential saphenous vein graft to OM1 and OM2, saphenous vein graft to posterior descending. She had multiple teeth extractions prior to surgery. She did well post op. She was anemic but did not need transfusion. At discharge she went to SNF for rehab-she did not stay the recommended time frame due to BR not being available when she needed it. She was not given discharge medications when she left SNF. She saw Cecilie Kicks, FNP, in followup for her hospitalization and was restarted on some of her medications.   [name] returns today and apparently has been having some problems with uterine bleeding. She is scheduled for a hysterectomy in July. She does report some discomfort at the incision site of her chest but overall has no chest pain. She occasionally gets some lower extremity swelling.  PMHx:  Past Medical History  Diagnosis Date  . Seasonal allergies   . Asthma     History of  . Hypertension   . Iron deficiency 12-07-2011  . Anemia     . GERD (gastroesophageal reflux disease)   . Heart murmur   . Osteoporosis   . PONV (postoperative nausea and vomiting)   . Dysrhythmia     occ palpitations   . Sleep apnea     no CPAP machine; sleep study 02/2010 REM AHI 61.7/hr, total sleep REM 14.8/hr  . Headache(784.0)     occasional   . Osteoarthritis   . Morbid obesity   . Acute urinary retention s/p Foley 01/07/2012  . Hemorrhoids, internal, with bleeding & prolapse 12/13/2011  . Myocardial infarction     unsure when; per cardiologist report  . Chronic headaches   . Coronary artery disease   . Anginal pain   . Dyslipidemia   . S/P CABG (coronary artery bypass graft)     x5 - LIMA to LAD, SVG to diagonal, SVG to OM1, SVG to OM2, SVG to PDA (Dr. Prescott Gum)  . Postmenopausal bleeding   . Neuropathy   . Blood dyscrasia     ABNORMAL VAGINAL BLEEDING    Past Surgical History  Procedure Laterality Date  . Knee surgery Bilateral 1999  . Total knee arthroplasty Left 04/29/2011  . Multiple tooth extractions    . Wisdom tooth extraction    . Colonoscopy  2009  . Knee arthroscopy  1991  . Transanal hemorrhoidal dearterliaization  01/06/12    with external hemorrhoid removal  . Coronary artery bypass graft N/A 01/09/2014    Procedure: CORONARY ARTERY BYPASS GRAFTING (CABG) x 5 using left internal mammary artery and right leg greater saphenous vein harvested endoscopically;  Surgeon:  Ivin Poot, MD;  Location: [facility];  Service: Open Heart Surgery;  Laterality: N/A;  please use bed extenders and breast binder  . Intraoperative transesophageal echocardiogram N/A 01/09/2014    Procedure: INTRAOPERATIVE TRANSESOPHAGEAL ECHOCARDIOGRAM;  Surgeon: Ivin Poot, MD;  Location: [facility];  Service: Open Heart Surgery;  Laterality: N/A;  . Tee without cardioversion  01/2010    EF 60-65%, small, flat, non-infiltrating, calcified, fixed apical/septal mass  . Nm myocar perf wall motion  01/2010    dipyridamole myoview - moderate perfusion defect  in basal inferoseptal, basal inferior, mid inferoseptal, mid inferior, apical inferior region; EF 56%  . Cardiac catheterization    . Multiple extractions with alveoloplasty N/A 04/11/2014    Procedure: Extraction of tooth #'s 1,2,8,16 with alveoloplasty, maxillary tuberosity reductions, and gross debridement of remaining teeth.;  Surgeon: Lenn Cal, DDS;  Location: [facility];  Service: Oral Surgery;  Laterality: N/A;    FAMHx:  Family History  Problem Relation Age of Onset  . Colon cancer Neg Hx   . Hypertension Sister   . Heart failure Sister   . Heart disease Sister   . Diabetes type II Sister   . Kidney disease Brother     hypertension  . Heart attack Mother 104  . Heart disease Mother   . Hypertension Mother   . Diabetes Mother   . Hypertension Child     SOCHx:   reports that she has never smoked. She has never used smokeless tobacco. She reports that she does not drink alcohol or use illicit drugs.  ALLERGIES:  Allergies  Allergen Reactions  . Cephalosporins Anaphylaxis    Tongue swelling, gum pain  . Codeine Other (See Comments)    hallucinations  . Latex Itching  . Penicillins Other (See Comments)    Childhood allergy but pt thinks she is no longer allergic  . Prednisone Other (See Comments)    Heart beating fast       ROS: A comprehensive review of systems was negative except for: Cardiovascular: positive for chest wall pain  HOME MEDS: Current Outpatient Prescriptions  Medication Sig Dispense Refill  . albuterol (PROVENTIL HFA;VENTOLIN HFA) 108 (90 BASE) MCG/ACT inhaler Inhale 2 puffs into the lungs every 6 (six) hours as needed. Wheezing  3.7 g  5  . cycloSPORINE (RESTASIS) 0.05 % ophthalmic emulsion Place 1 drop into both eyes 2 (two) times daily.      Marland Kitchen esomeprazole (NEXIUM) 40 MG capsule Take 40 mg by mouth daily at 12 noon.      . furosemide (LASIX) 40 MG tablet Take 40 mg by mouth daily.      Marland Kitchen HYDROcodone-acetaminophen (NORCO/VICODIN) 5-325 MG  per tablet Take 1-2 tablets by mouth every 6 (six) hours as needed for moderate pain or severe pain.  40 tablet  0  . lisinopril (PRINIVIL,ZESTRIL) 2.5 MG tablet Take 1 tablet (2.5 mg total) by mouth daily.  30 tablet  6  . potassium chloride (K-DUR) 10 MEQ tablet Take 10 mEq by mouth daily.      . rosuvastatin (CRESTOR) 20 MG tablet Take 1 tablet (20 mg total) by mouth daily.  28 tablet  0  . traMADol (ULTRAM) 50 MG tablet Take 50 mg by mouth every 6 (six) hours as needed for moderate pain.      Marland Kitchen diclofenac sodium (VOLTAREN) 1 % GEL Apply 2 g topically 2 (two) times daily as needed (joint pain).      Marland Kitchen ibuprofen (ADVIL,MOTRIN) 400 MG  tablet Take 400 mg by mouth every 8 (eight) hours as needed for mild pain.      . megestrol (MEGACE) 20 MG tablet Take 20 mg by mouth 2 (two) times daily.      . metoprolol tartrate (LOPRESSOR) 25 MG tablet Take 25 mg by mouth 2 (two) times daily.       No current facility-administered medications for this visit.    LABS/IMAGING: No results found for this or any previous visit (from the past 48 hour(s)). No results found.  VITALS: BP 122/64  Pulse 73  Ht 5\' 8"  (1.727 m)  Wt 303 lb 8 oz (137.667 kg)  BMI 46.16 kg/m2  LMP 01/13/2014  EXAM: General appearance: alert, no distress and morbidly obese Neck: no carotid bruit and no JVD Lungs: clear to auscultation bilaterally Heart: regular rate and rhythm, S1, S2 normal, no murmur, click, rub or gallop Abdomen: soft, non-tender; bowel sounds normal; no masses,  no organomegaly Extremities: edema trace pedal Pulses: 2+ and symmetric Skin: Skin color, texture, turgor normal. No rashes or lesions Neurologic: Grossly normal Psych: Mood, affect normal  EKG: Normal sinus rhythm at 73  ASSESSMENT: 1. Coronary artery disease status post 5 vessel CABG 2. Morbid obesity 3. Hypertension 4. Dyslipidemia 5. OSA, not on cPAP 6. Low risk for upcoming surgery  PLAN: 1.   Ms. Snoke is recovering from coronary  artery bypass grafting.  She is low risk for her upcoming vaginal surgery. No further cardiac testing is necessary prior to starting. Plan to see her back in 6 months.  [name], MD, [facility] Attending Cardiologist [facility]  HILTY,Kenneth C 05/21/2014, 5:53 PM

## 2014-05-22 ENCOUNTER — Encounter (HOSPITAL_COMMUNITY): Payer: Medicare Other

## 2014-05-22 DIAGNOSIS — C55 Malignant neoplasm of uterus, part unspecified: Secondary | ICD-10-CM

## 2014-05-22 HISTORY — DX: Malignant neoplasm of uterus, part unspecified: C55

## 2014-05-24 ENCOUNTER — Encounter (HOSPITAL_COMMUNITY): Payer: Medicare Other

## 2014-05-27 ENCOUNTER — Encounter (HOSPITAL_COMMUNITY): Payer: Medicare Other

## 2014-05-29 ENCOUNTER — Encounter (HOSPITAL_COMMUNITY)
Admission: RE | Admit: 2014-05-29 | Discharge: 2014-05-29 | Disposition: A | Discharge status: Home / Self Care | Payer: Medicare Other | Source: Ambulatory Visit | Attending: Obstetrics & Gynecology | Admitting: Obstetrics & Gynecology

## 2014-05-29 ENCOUNTER — Encounter (HOSPITAL_COMMUNITY): Payer: Self-pay

## 2014-05-29 ENCOUNTER — Encounter (HOSPITAL_COMMUNITY): Payer: Medicare Other

## 2014-05-29 DIAGNOSIS — Z01812 Encounter for preprocedural laboratory examination: Secondary | ICD-10-CM | POA: Insufficient documentation

## 2014-05-29 LAB — CBC
HCT: 25.5 % — ABNORMAL LOW (ref 36.0–46.0)
HEMOGLOBIN: 8 g/dL — AB (ref 12.0–15.0)
MCH: 22.5 pg — ABNORMAL LOW (ref 26.0–34.0)
MCHC: 31.4 g/dL (ref 30.0–36.0)
MCV: 71.8 fL — ABNORMAL LOW (ref 78.0–100.0)
Platelets: 309 10*3/uL (ref 150–400)
RBC: 3.55 MIL/uL — ABNORMAL LOW (ref 3.87–5.11)
RDW: 17.3 % — ABNORMAL HIGH (ref 11.5–15.5)
WBC: 5.9 10*3/uL (ref 4.0–10.5)

## 2014-05-29 LAB — TYPE AND SCREEN
ABO/RH(D): O POS
ANTIBODY SCREEN: NEGATIVE

## 2014-05-29 LAB — BASIC METABOLIC PANEL
Anion gap: 11 (ref 5–15)
BUN: 9 mg/dL (ref 6–23)
CO2: 25 mEq/L (ref 19–32)
Calcium: 9.5 mg/dL (ref 8.4–10.5)
Chloride: 102 mEq/L (ref 96–112)
Creatinine, Ser: 0.78 mg/dL (ref 0.50–1.10)
GFR, EST NON AFRICAN AMERICAN: 90 mL/min — AB (ref >90)
Glucose, Bld: 104 mg/dL — ABNORMAL HIGH (ref 70–99)
POTASSIUM: 3.9 meq/L (ref 3.7–5.3)
Sodium: 138 mEq/L (ref 137–147)

## 2014-05-29 LAB — ABO/RH: ABO/RH(D): O POS

## 2014-05-29 NOTE — Patient Instructions (Signed)
Your procedure is scheduled on:06/03/14   Enter through the Main Entrance at :7:30 am Pick up desk phone and dial [phone] and inform us of your arrival.  Please call [phone] if you have any problems the morning of surgery.  Remember: Do not eat food or drink liquids, including water, after midnight:Sunday    You may brush your teeth the morning of surgery.  Take these meds the morning of surgery with a sip of water:usual am meds  DO NOT wear jewelry, eye make-up, lipstick,body lotion, or dark fingernail polish.  (Polished toes are ok) You may wear deodorant.  If you are to be admitted after surgery, leave suitcase in car until your room has been assigned. Patients discharged on the day of surgery will not be allowed to drive home. Wear loose fitting, comfortable clothes for your ride home.

## 2014-05-29 NOTE — Pre-Procedure Instructions (Addendum)
Marnie in OB/GYN office given H&H results to share with Dr. Ihor Dow

## 2014-05-29 NOTE — Pre-Procedure Instructions (Signed)
I notified Dr. Royce Macadamia of pt's hgb and hct results- he ordered T&C 2 units on DOS

## 2014-05-31 ENCOUNTER — Encounter (HOSPITAL_COMMUNITY): Payer: Medicare Other

## 2014-06-02 MED ORDER — GENTAMICIN SULFATE 40 MG/ML IJ SOLN
INTRAVENOUS | Status: AC
  Administered 2014-06-03: 117.5 mL via INTRAVENOUS
  Filled 2014-06-02: qty 11.5

## 2014-06-03 ENCOUNTER — Encounter (HOSPITAL_COMMUNITY)
Admission: RE | Disposition: A | Discharge status: Home / Self Care | Payer: Self-pay | Source: Ambulatory Visit | Attending: Obstetrics & Gynecology

## 2014-06-03 ENCOUNTER — Encounter (HOSPITAL_COMMUNITY): Payer: Self-pay | Admitting: *Deleted

## 2014-06-03 ENCOUNTER — Observation Stay (HOSPITAL_COMMUNITY)
Admission: RE | Admit: 2014-06-03 | Discharge: 2014-06-04 | Disposition: A | Discharge status: Home / Self Care | Payer: Medicare Other | Source: Ambulatory Visit | Attending: Obstetrics & Gynecology | Admitting: Obstetrics & Gynecology

## 2014-06-03 ENCOUNTER — Encounter (HOSPITAL_COMMUNITY): Payer: Medicare Other | Admitting: Anesthesiology

## 2014-06-03 ENCOUNTER — Inpatient Hospital Stay (HOSPITAL_COMMUNITY): Payer: Medicare Other | Admitting: Anesthesiology

## 2014-06-03 DIAGNOSIS — I10 Essential (primary) hypertension: Secondary | ICD-10-CM | POA: Insufficient documentation

## 2014-06-03 DIAGNOSIS — D500 Iron deficiency anemia secondary to blood loss (chronic): Secondary | ICD-10-CM

## 2014-06-03 DIAGNOSIS — I252 Old myocardial infarction: Secondary | ICD-10-CM | POA: Diagnosis not present

## 2014-06-03 DIAGNOSIS — D509 Iron deficiency anemia, unspecified: Secondary | ICD-10-CM

## 2014-06-03 DIAGNOSIS — I2510 Atherosclerotic heart disease of native coronary artery without angina pectoris: Secondary | ICD-10-CM | POA: Diagnosis not present

## 2014-06-03 DIAGNOSIS — D649 Anemia, unspecified: Secondary | ICD-10-CM | POA: Insufficient documentation

## 2014-06-03 DIAGNOSIS — N949 Unspecified condition associated with female genital organs and menstrual cycle: Secondary | ICD-10-CM | POA: Diagnosis not present

## 2014-06-03 DIAGNOSIS — N924 Excessive bleeding in the premenopausal period: Secondary | ICD-10-CM

## 2014-06-03 DIAGNOSIS — C55 Malignant neoplasm of uterus, part unspecified: Secondary | ICD-10-CM | POA: Diagnosis not present

## 2014-06-03 DIAGNOSIS — D251 Intramural leiomyoma of uterus: Secondary | ICD-10-CM | POA: Diagnosis not present

## 2014-06-03 DIAGNOSIS — Z9889 Other specified postprocedural states: Secondary | ICD-10-CM

## 2014-06-03 DIAGNOSIS — D759 Disease of blood and blood-forming organs, unspecified: Secondary | ICD-10-CM | POA: Diagnosis not present

## 2014-06-03 DIAGNOSIS — K219 Gastro-esophageal reflux disease without esophagitis: Secondary | ICD-10-CM | POA: Insufficient documentation

## 2014-06-03 DIAGNOSIS — Z951 Presence of aortocoronary bypass graft: Secondary | ICD-10-CM | POA: Insufficient documentation

## 2014-06-03 DIAGNOSIS — Z6841 Body Mass Index (BMI) 40.0 and over, adult: Secondary | ICD-10-CM | POA: Insufficient documentation

## 2014-06-03 DIAGNOSIS — N920 Excessive and frequent menstruation with regular cycle: Secondary | ICD-10-CM | POA: Diagnosis not present

## 2014-06-03 DIAGNOSIS — N8000 Endometriosis of the uterus, unspecified: Secondary | ICD-10-CM | POA: Insufficient documentation

## 2014-06-03 DIAGNOSIS — J45909 Unspecified asthma, uncomplicated: Secondary | ICD-10-CM | POA: Diagnosis not present

## 2014-06-03 DIAGNOSIS — G4730 Sleep apnea, unspecified: Secondary | ICD-10-CM | POA: Diagnosis not present

## 2014-06-03 DIAGNOSIS — N925 Other specified irregular menstruation: Secondary | ICD-10-CM | POA: Diagnosis present

## 2014-06-03 DIAGNOSIS — N8502 Endometrial intraepithelial neoplasia [EIN]: Secondary | ICD-10-CM

## 2014-06-03 DIAGNOSIS — N938 Other specified abnormal uterine and vaginal bleeding: Principal | ICD-10-CM | POA: Insufficient documentation

## 2014-06-03 HISTORY — PX: VAGINAL HYSTERECTOMY: SHX2639

## 2014-06-03 HISTORY — PX: UNILATERAL SALPINGECTOMY: SHX6160

## 2014-06-03 LAB — CBC
HCT: 26.8 % — ABNORMAL LOW (ref 36.0–46.0)
HCT: 24.3 % — ABNORMAL LOW (ref 36.0–46.0)
Hemoglobin: 8.6 g/dL — ABNORMAL LOW (ref 12.0–15.0)
Hemoglobin: 7.7 g/dL — ABNORMAL LOW (ref 12.0–15.0)
MCH: 22.9 pg — ABNORMAL LOW (ref 26.0–34.0)
MCH: 22.7 pg — ABNORMAL LOW (ref 26.0–34.0)
MCHC: 32.1 g/dL (ref 30.0–36.0)
MCHC: 31.7 g/dL (ref 30.0–36.0)
MCV: 71.3 fL — AB (ref 78.0–100.0)
MCV: 71.7 fL — ABNORMAL LOW (ref 78.0–100.0)
Platelets: 323 10*3/uL (ref 150–400)
Platelets: 285 10*3/uL (ref 150–400)
RBC: 3.76 MIL/uL — ABNORMAL LOW (ref 3.87–5.11)
RBC: 3.39 MIL/uL — ABNORMAL LOW (ref 3.87–5.11)
RDW: 17.6 % — AB (ref 11.5–15.5)
RDW: 17.5 % — ABNORMAL HIGH (ref 11.5–15.5)
WBC: 7.5 10*3/uL (ref 4.0–10.5)
WBC: 6.3 10*3/uL (ref 4.0–10.5)

## 2014-06-03 LAB — PREPARE RBC (CROSSMATCH)

## 2014-06-03 SURGERY — HYSTERECTOMY, VAGINAL
Anesthesia: Spinal | Site: Vagina | Laterality: Right

## 2014-06-03 MED ORDER — LACTATED RINGERS IV SOLN
INTRAVENOUS | Status: DC
  Administered 2014-06-03: 09:00:00 via INTRAVENOUS

## 2014-06-03 MED ORDER — PROPOFOL INFUSION 10 MG/ML OPTIME
INTRAVENOUS | Status: DC | PRN
  Administered 2014-06-03: 50 ug/kg/min via INTRAVENOUS

## 2014-06-03 MED ORDER — CYCLOSPORINE 0.05 % OP EMUL
1.0000 [drp] | Freq: Two times a day (BID) | OPHTHALMIC | Status: DC
  Administered 2014-06-03 – 2014-06-04 (×2): 1 [drp] via OPHTHALMIC
  Filled 2014-06-03 (×3): qty 1

## 2014-06-03 MED ORDER — DIPHENHYDRAMINE HCL 50 MG/ML IJ SOLN
12.5000 mg | INTRAMUSCULAR | Status: DC | PRN
  Administered 2014-06-03: 12.5 mg via INTRAVENOUS
  Filled 2014-06-03: qty 1

## 2014-06-03 MED ORDER — VASOPRESSIN 20 UNIT/ML IJ SOLN
INTRAMUSCULAR | Status: AC
Start: 2014-06-03 — End: 2014-06-03
  Filled 2014-06-03: qty 1

## 2014-06-03 MED ORDER — ALBUTEROL SULFATE HFA 108 (90 BASE) MCG/ACT IN AERS: 2.0000 | INHALATION_SPRAY | RESPIRATORY_TRACT | Status: DC | PRN

## 2014-06-03 MED ORDER — SODIUM CHLORIDE 0.9 % IJ SOLN
INTRAMUSCULAR | Status: DC | PRN
  Administered 2014-06-03: 50 mL

## 2014-06-03 MED ORDER — SCOPOLAMINE 1 MG/3DAYS TD PT72
1.0000 | MEDICATED_PATCH | TRANSDERMAL | Status: DC
  Administered 2014-06-03: 1.5 mg via TRANSDERMAL

## 2014-06-03 MED ORDER — FENTANYL CITRATE 0.05 MG/ML IJ SOLN
INTRAMUSCULAR | Status: DC | PRN
  Administered 2014-06-03: 50 ug via INTRAVENOUS
  Administered 2014-06-03 (×2): 25 ug via INTRAVENOUS
  Administered 2014-06-03: 50 ug via INTRAVENOUS
  Administered 2014-06-03 (×2): 25 ug via INTRAVENOUS

## 2014-06-03 MED ORDER — FUROSEMIDE 40 MG PO TABS
40.0000 mg | ORAL_TABLET | Freq: Every day | ORAL | Status: DC
  Administered 2014-06-04: 40 mg via ORAL
  Filled 2014-06-03: qty 1

## 2014-06-03 MED ORDER — NALOXONE HCL 1 MG/ML IJ SOLN
1.0000 ug/kg/h | INTRAVENOUS | Status: DC | PRN
  Filled 2014-06-03: qty 2

## 2014-06-03 MED ORDER — METOPROLOL TARTRATE 25 MG PO TABS
25.0000 mg | ORAL_TABLET | Freq: Two times a day (BID) | ORAL | Status: DC
  Administered 2014-06-04: 25 mg via ORAL
  Filled 2014-06-03: qty 1

## 2014-06-03 MED ORDER — LACTATED RINGERS IV SOLN: INTRAVENOUS | Status: DC

## 2014-06-03 MED ORDER — ESTRADIOL 0.1 MG/GM VA CREA
TOPICAL_CREAM | VAGINAL | Status: AC
  Filled 2014-06-03: qty 42.5

## 2014-06-03 MED ORDER — KETOROLAC TROMETHAMINE 30 MG/ML IJ SOLN: 30.0000 mg | Freq: Four times a day (QID) | INTRAMUSCULAR | Status: DC | PRN

## 2014-06-03 MED ORDER — ONDANSETRON HCL 4 MG/2ML IJ SOLN
4.0000 mg | Freq: Three times a day (TID) | INTRAMUSCULAR | Status: DC | PRN
  Filled 2014-06-03: qty 2

## 2014-06-03 MED ORDER — KETOROLAC TROMETHAMINE 30 MG/ML IJ SOLN
30.0000 mg | Freq: Once | INTRAMUSCULAR | Status: AC
  Administered 2014-06-03: 30 mg via INTRAVENOUS

## 2014-06-03 MED ORDER — SCOPOLAMINE 1 MG/3DAYS TD PT72
MEDICATED_PATCH | TRANSDERMAL | Status: AC
  Administered 2014-06-03: 1.5 mg via TRANSDERMAL
  Filled 2014-06-03: qty 1

## 2014-06-03 MED ORDER — MORPHINE SULFATE (PF) 0.5 MG/ML IJ SOLN
INTRAMUSCULAR | Status: DC | PRN
  Administered 2014-06-03: .15 mg via INTRATHECAL

## 2014-06-03 MED ORDER — NALOXONE HCL 0.4 MG/ML IJ SOLN: 0.4000 mg | INTRAMUSCULAR | Status: DC | PRN

## 2014-06-03 MED ORDER — ONDANSETRON HCL 4 MG/2ML IJ SOLN
INTRAMUSCULAR | Status: DC | PRN
  Administered 2014-06-03: 4 mg via INTRAVENOUS

## 2014-06-03 MED ORDER — HYDROMORPHONE HCL PF 1 MG/ML IJ SOLN: 0.2000 mg | INTRAMUSCULAR | Status: DC | PRN

## 2014-06-03 MED ORDER — DEXAMETHASONE SODIUM PHOSPHATE 4 MG/ML IJ SOLN
INTRAMUSCULAR | Status: DC | PRN
  Administered 2014-06-03: 10 mg via INTRAVENOUS

## 2014-06-03 MED ORDER — VASOPRESSIN 20 UNIT/ML IJ SOLN
INTRAVENOUS | Status: DC | PRN
  Administered 2014-06-03: 10:00:00

## 2014-06-03 MED ORDER — DIPHENHYDRAMINE HCL 50 MG/ML IJ SOLN: 25.0000 mg | Freq: Four times a day (QID) | INTRAMUSCULAR | Status: DC | PRN

## 2014-06-03 MED ORDER — SODIUM CHLORIDE 0.9 % IJ SOLN: 3.0000 mL | INTRAMUSCULAR | Status: DC | PRN

## 2014-06-03 MED ORDER — ONDANSETRON HCL 4 MG/2ML IJ SOLN
4.0000 mg | Freq: Four times a day (QID) | INTRAMUSCULAR | Status: DC | PRN
  Administered 2014-06-03: 4 mg via INTRAVENOUS

## 2014-06-03 MED ORDER — ONDANSETRON HCL 4 MG PO TABS: 4.0000 mg | ORAL_TABLET | Freq: Four times a day (QID) | ORAL | Status: DC | PRN

## 2014-06-03 MED ORDER — SODIUM CHLORIDE 0.9 % IJ SOLN
INTRAMUSCULAR | Status: AC
  Filled 2014-06-03: qty 50

## 2014-06-03 MED ORDER — LISINOPRIL 2.5 MG PO TABS
2.5000 mg | ORAL_TABLET | Freq: Every day | ORAL | Status: DC
  Administered 2014-06-04: 2.5 mg via ORAL
  Filled 2014-06-03: qty 1

## 2014-06-03 MED ORDER — MENTHOL 3 MG MT LOZG: 1.0000 | LOZENGE | OROMUCOSAL | Status: DC | PRN

## 2014-06-03 MED ORDER — POTASSIUM CHLORIDE IN NACL 20-0.9 MEQ/L-% IV SOLN
INTRAVENOUS | Status: DC
  Administered 2014-06-04: 01:00:00 via INTRAVENOUS
  Filled 2014-06-03 (×6): qty 1000

## 2014-06-03 MED ORDER — DOCUSATE SODIUM 100 MG PO CAPS
100.0000 mg | ORAL_CAPSULE | Freq: Two times a day (BID) | ORAL | Status: DC
  Administered 2014-06-03 – 2014-06-04 (×2): 100 mg via ORAL
  Filled 2014-06-03 (×2): qty 1

## 2014-06-03 MED ORDER — DIPHENHYDRAMINE HCL 25 MG PO CAPS
25.0000 mg | ORAL_CAPSULE | ORAL | Status: DC | PRN
Start: 2014-06-03 — End: 2014-06-04
  Administered 2014-06-03: 25 mg via ORAL
  Filled 2014-06-03: qty 1

## 2014-06-03 MED ORDER — ALBUTEROL SULFATE (2.5 MG/3ML) 0.083% IN NEBU: 2.5000 mg | INHALATION_SOLUTION | RESPIRATORY_TRACT | Status: DC | PRN

## 2014-06-03 MED ORDER — SIMETHICONE 80 MG PO CHEW: 80.0000 mg | CHEWABLE_TABLET | Freq: Four times a day (QID) | ORAL | Status: DC | PRN

## 2014-06-03 MED ORDER — PANTOPRAZOLE SODIUM 40 MG PO TBEC
80.0000 mg | DELAYED_RELEASE_TABLET | Freq: Every day | ORAL | Status: DC
  Administered 2014-06-04: 80 mg via ORAL
  Filled 2014-06-03: qty 2

## 2014-06-03 MED ORDER — ENOXAPARIN SODIUM 40 MG/0.4ML ~~LOC~~ SOLN
40.0000 mg | SUBCUTANEOUS | Status: DC
  Administered 2014-06-04: 40 mg via SUBCUTANEOUS
  Filled 2014-06-03: qty 0.4

## 2014-06-03 MED ORDER — DIPHENHYDRAMINE HCL 25 MG PO CAPS
25.0000 mg | ORAL_CAPSULE | ORAL | Status: DC | PRN
  Filled 2014-06-03: qty 1

## 2014-06-03 MED ORDER — NALBUPHINE HCL 10 MG/ML IJ SOLN
5.0000 mg | INTRAMUSCULAR | Status: DC | PRN
  Administered 2014-06-04: 5 mg via INTRAVENOUS
  Filled 2014-06-03: qty 1

## 2014-06-03 MED ORDER — DIPHENHYDRAMINE HCL 50 MG/ML IJ SOLN: 25.0000 mg | INTRAMUSCULAR | Status: DC | PRN

## 2014-06-03 MED ORDER — NALBUPHINE HCL 10 MG/ML IJ SOLN
5.0000 mg | INTRAMUSCULAR | Status: DC | PRN
  Administered 2014-06-03: 10 mg via SUBCUTANEOUS
  Filled 2014-06-03: qty 1

## 2014-06-03 MED ORDER — METOCLOPRAMIDE HCL 5 MG/ML IJ SOLN
10.0000 mg | Freq: Three times a day (TID) | INTRAMUSCULAR | Status: DC | PRN
Start: 2014-06-03 — End: 2014-06-04

## 2014-06-03 MED ORDER — MIDAZOLAM HCL 5 MG/5ML IJ SOLN
INTRAMUSCULAR | Status: DC | PRN
  Administered 2014-06-03 (×2): 1 mg via INTRAVENOUS

## 2014-06-03 MED ORDER — TRAMADOL HCL 50 MG PO TABS: 50.0000 mg | ORAL_TABLET | Freq: Four times a day (QID) | ORAL | Status: DC | PRN

## 2014-06-03 MED ORDER — POTASSIUM CHLORIDE ER 10 MEQ PO TBCR
10.0000 meq | EXTENDED_RELEASE_TABLET | Freq: Every day | ORAL | Status: DC
  Administered 2014-06-04: 10 meq via ORAL
  Filled 2014-06-03: qty 1

## 2014-06-03 MED ORDER — SCOPOLAMINE 1 MG/3DAYS TD PT72
1.0000 | MEDICATED_PATCH | Freq: Once | TRANSDERMAL | Status: DC
  Filled 2014-06-03: qty 1

## 2014-06-03 MED ORDER — FENTANYL CITRATE 0.05 MG/ML IJ SOLN
25.0000 ug | INTRAMUSCULAR | Status: DC | PRN
  Administered 2014-06-03: 25 ug via INTRAVENOUS

## 2014-06-03 MED ORDER — BISACODYL 10 MG RE SUPP: 10.0000 mg | Freq: Every day | RECTAL | Status: DC | PRN

## 2014-06-03 MED ORDER — MEPERIDINE HCL 25 MG/ML IJ SOLN: 6.2500 mg | INTRAMUSCULAR | Status: DC | PRN

## 2014-06-03 SURGICAL SUPPLY — 27 items
CANISTER SUCT 3000ML (MISCELLANEOUS) ×4 IMPLANT
CLOTH BEACON ORANGE TIMEOUT ST (SAFETY) ×4 IMPLANT
CONT PATH 16OZ SNAP LID 3702 (MISCELLANEOUS) IMPLANT
COVER MAYO STAND STRL (DRAPES) ×4 IMPLANT
DECANTER SPIKE VIAL GLASS SM (MISCELLANEOUS) IMPLANT
DRAPE HYSTEROSCOPY (DRAPE) ×4 IMPLANT
DRSG TELFA 3X8 NADH (GAUZE/BANDAGES/DRESSINGS) ×4 IMPLANT
GAUZE PACKING 2X5 YD STRL (GAUZE/BANDAGES/DRESSINGS) IMPLANT
GLOVE BIO SURGEON STRL SZ7 (GLOVE) ×4 IMPLANT
GLOVE BIOGEL PI IND STRL 6.5 (GLOVE) ×2 IMPLANT
GLOVE BIOGEL PI IND STRL 7.0 (GLOVE) ×2 IMPLANT
GLOVE BIOGEL PI INDICATOR 6.5 (GLOVE) ×2
GLOVE BIOGEL PI INDICATOR 7.0 (GLOVE) ×2
GOWN STRL REUS W/TWL LRG LVL3 (GOWN DISPOSABLE) ×16 IMPLANT
NEEDLE SPNL 20GX3.5 QUINCKE YW (NEEDLE) IMPLANT
NS IRRIG 1000ML POUR BTL (IV SOLUTION) ×4 IMPLANT
PACK VAGINAL WOMENS (CUSTOM PROCEDURE TRAY) ×4 IMPLANT
PAD OB MATERNITY 4.3X12.25 (PERSONAL CARE ITEMS) ×4 IMPLANT
SHEARS FOC LG CVD HARMONIC 17C (MISCELLANEOUS) IMPLANT
SUT VIC AB 0 CT1 18XCR BRD8 (SUTURE) ×6 IMPLANT
SUT VIC AB 0 CT1 36 (SUTURE) ×8 IMPLANT
SUT VIC AB 0 CT1 8-18 (SUTURE) ×6
SUT VICRYL 0 TIES 12 18 (SUTURE) ×4 IMPLANT
SYR 30ML LL (SYRINGE) IMPLANT
TOWEL OR 17X24 6PK STRL BLUE (TOWEL DISPOSABLE) ×8 IMPLANT
TRAY FOLEY CATH 14FR (SET/KITS/TRAYS/PACK) ×4 IMPLANT
WATER STERILE IRR 1000ML POUR (IV SOLUTION) ×4 IMPLANT

## 2014-06-03 NOTE — Anesthesia Postprocedure Evaluation (Signed)
  Anesthesia Post-op Note  Patient: [name]  Procedure(s) Performed: Procedure(s): HYSTERECTOMY VAGINAL (N/A) UNILATERAL SALPINGECTOMY (Right)  Patient Location: PACU and Women's Unit  Anesthesia Type:Spinal  Level of Consciousness: awake, alert  and oriented  Airway and Oxygen Therapy: Patient Spontanous Breathing  Post-op Pain: mild  Post-op Assessment: Post-op Vital signs reviewed  Post-op Vital Signs: Reviewed and stable  Last Vitals:  Filed Vitals:   06/03/14 1415  BP: 130/60  Pulse: 75  Temp: 36.6 C  Resp: 16    Complications: No apparent anesthesia complications

## 2014-06-03 NOTE — OR Nursing (Signed)
ADDED COMMENTS UNDER SITE PREP. "IN AND OUT STRAIGHT CATH IN THE BEGINNING BY DR. HARRAWAY-SMITH".

## 2014-06-03 NOTE — Anesthesia Postprocedure Evaluation (Signed)
  Anesthesia Post-op Note  Patient: [name]  Procedure(s) Performed: Procedure(s): HYSTERECTOMY VAGINAL (N/A) UNILATERAL SALPINGECTOMY (Right)  Patient is awake, responsive, moving her legs, and has signs of resolution of her numbness. Pain and nausea are reasonably well controlled. Vital signs are stable and clinically acceptable. Oxygen saturation is clinically acceptable. There are no apparent anesthetic complications at this time. Patient is ready for discharge.

## 2014-06-03 NOTE — Anesthesia Preprocedure Evaluation (Addendum)
Anesthesia Evaluation  Patient identified by MRN, date of birth, ID band Patient awake    Reviewed: Allergy & Precautions, H&P , NPO status , Patient's Chart, lab work & pertinent test results, reviewed documented beta blocker date and time   History of Anesthesia Complications (+) PONV and history of anesthetic complications  Airway Mallampati: II TM Distance: >3 FB Neck ROM: Full    Dental  (+) Dental Advisory Given, Chipped, Missing   Pulmonary asthma , sleep apnea (does not use CPAP) , COPD COPD inhaler,  breath sounds clear to auscultation        Cardiovascular hypertension, Pt. on medications and Pt. on home beta blockers + CAD, + Past MI and + CABG (2/15 CABG x5) Rhythm:Regular Rate:Normal  2/15 ECHO: EF 55-65%, valves OK   Neuro/Psych  Headaches,    GI/Hepatic Neg liver ROS, GERD-  Medicated and Controlled,  Endo/Other  Morbid obesity  Renal/GU negative Renal ROS     Musculoskeletal   Abdominal (+) + obese,   Peds  Hematology  (+) Blood dyscrasia (Hb 8.0, TC x 2 done), anemia ,   Anesthesia Other Findings   Reproductive/Obstetrics                       Anesthesia Physical  Anesthesia Plan  ASA: III  Anesthesia Plan: Spinal   Post-op Pain Management:    Induction: Intravenous  Airway Management Planned: LMA  Additional Equipment:   Intra-op Plan:   Post-operative Plan: Extubation in OR  Informed Consent: I have reviewed the patients History and Physical, chart, labs and discussed the procedure including the risks, benefits and alternatives for the proposed anesthesia with the patient or authorized representative who has indicated his/her understanding and acceptance.   Dental advisory given  Plan Discussed with: CRNA and Surgeon  Anesthesia Plan Comments: (Plan routine monitors, spinal anesthesia  Transfusion poss,  GETA )       Anesthesia Quick Evaluation

## 2014-06-03 NOTE — Transfer of Care (Signed)
Immediate Anesthesia Transfer of Care Note  Patient: [name]  Procedure(s) Performed: Procedure(s): HYSTERECTOMY VAGINAL (N/A) UNILATERAL SALPINGECTOMY (Right)  Patient Location: PACU  Anesthesia Type:Spinal  Level of Consciousness: awake, alert , oriented and patient cooperative  Airway & Oxygen Therapy: Patient Spontanous Breathing  Post-op Assessment: Report given to PACU RN and Post -op Vital signs reviewed and stable  Post vital signs: Reviewed and stable  Complications: No apparent anesthesia complications

## 2014-06-03 NOTE — Plan of Care (Signed)
Problem: Phase I Progression Outcomes Goal: Admission history reviewed met

## 2014-06-03 NOTE — Progress Notes (Signed)
Day of Surgery Procedure(s) (LRB): HYSTERECTOMY VAGINAL (N/A) UNILATERAL SALPINGECTOMY (Right)  Subjective: Patient reports no pain.  She reports having Zofran earlier for nausea which has resolved.       Objective: I have reviewed patient's vital signs, intake and output, medications and labs.  General: alert and no distress Had IV tubing come out and the nurse is reconnecting  CBC    Component Value Date/Time   WBC 6.3 06/03/2014 1135   RBC 3.39* 06/03/2014 1135   HGB 7.7* 06/03/2014 1135   HCT 24.3* 06/03/2014 1135   PLT 285 06/03/2014 1135   MCV 71.7* 06/03/2014 1135   MCH 22.7* 06/03/2014 1135   MCHC 31.7 06/03/2014 1135   RDW 17.5* 06/03/2014 1135   LYMPHSABS 2.2 01/31/2014 1039   MONOABS 0.5 01/31/2014 1039   EOSABS 0.5 01/31/2014 1039   BASOSABS 0.0 01/31/2014 1039     Assessment: s/p Procedure(s): HYSTERECTOMY VAGINAL (N/A) UNILATERAL SALPINGECTOMY (Right): stable, progressing well and anemia  Plan: Advance diet Encourage ambulation will transfuse 2 units of PRBCs   LOS: 0 days    HARRAWAY-SMITH, Leighton Brickley 06/03/2014, 4:33 PM

## 2014-06-03 NOTE — Brief Op Note (Signed)
06/03/2014  11:04 AM  PATIENT:  [name]  58 y.o. female  PRE-OPERATIVE DIAGNOSIS:  cpt [phone] - Dysfunctional uterine bleeding, worsening atypia per pathologist and anemia  POST-OPERATIVE DIAGNOSIS:  Dysfunctional uterine bleeding, worsening atypia per pathologist and anemia  PROCEDURE:  Procedure(s): HYSTERECTOMY VAGINAL (N/A) UNILATERAL SALPINGECTOMY (Right)  SURGEON:  Surgeon(s) and Role:    * Lavonia Drafts, MD - Primary    * Guss Bunde, MD - Assisting  ANESTHESIA:   spinal  EBL:  Total I/O In: -  Out: 200 [Urine:100; Blood:100]  BLOOD ADMINISTERED:none  DRAINS: none   LOCAL MEDICATIONS USED:  OTHER dilute vasopressin solution  SPECIMEN:  Source of Specimen:  uterus, cervix and right fallopian tube   DISPOSITION OF SPECIMEN:  PATHOLOGY  COUNTS:  YES  TOURNIQUET:  * No tourniquets in log *  DICTATION: .Note written in EPIC  PLAN OF CARE: Admit for overnight observation  PATIENT DISPOSITION:  PACU - hemodynamically stable.   Delay start of Pharmacological VTE agent (>24hrs) due to surgical blood loss or risk of bleeding: yes

## 2014-06-03 NOTE — Op Note (Signed)
06/03/2014  11:04 AM  PATIENT:  [name]  58 y.o. female  PRE-OPERATIVE DIAGNOSIS:  cpt [phone] - Dysfunctional uterine bleeding, worsening atypia per pathologist and anemia  POST-OPERATIVE DIAGNOSIS:  Dysfunctional uterine bleeding, worsening atypia per pathologist and anemia  PROCEDURE:  Procedure(s): HYSTERECTOMY VAGINAL (N/A) UNILATERAL SALPINGECTOMY (Right)  SURGEON:  Surgeon(s) and Role:    * Lavonia Drafts, MD - Primary    * Guss Bunde, MD - Assisting  ANESTHESIA:   spinal  EBL:  Total I/O In: -  Out: 200 [Urine:100; Blood:100]  BLOOD ADMINISTERED:none  DRAINS: none   LOCAL MEDICATIONS USED:  OTHER dilute vasopressin solution  SPECIMEN:  Source of Specimen:  uterus, cervix and right fallopian tube   DISPOSITION OF SPECIMEN:  PATHOLOGY  COUNTS:  YES  TOURNIQUET:  * No tourniquets in log *  DICTATION: .Note written in EPIC  PLAN OF CARE: Admit for overnight observation  PATIENT DISPOSITION:  PACU - hemodynamically stable.   Delay start of Pharmacological VTE agent (>24hrs) due to surgical blood loss or risk of bleeding: yes   INDICATIONS: The patient is a 58 y.o. [id number] with history of symptomatic uterine fibroids/menorrhagia. The patient made a decision to undergo definite surgical treatment. On the preoperative visit, the risks, benefits, indications, and alternatives of the procedure were reviewed with the patient.  On the day of surgery, the risks of surgery were again discussed with the patient including but not limited to: bleeding which may require transfusion or reoperation; infection which may require antibiotics; injury to bowel, bladder, ureters or other surrounding organs; need for additional procedures; thromboembolic phenomenon, incisional problems and other postoperative/anesthesia complications. Written informed consent was obtained.    OPERATIVE FINDINGS: A 8 week size uterus with normal right fallopian tube (left fallopian  tube not visualized); normal ovaries bilaterally. Pt was noted to have a grade III cystocele upon relaxation (she denied incontinence)   COMPLICATIONS:  None immediate.  DESCRIPTION OF PROCEDURE:  The patient received intravenous antibiotics and had sequential compression devices applied to her lower extremities while in the preoperative area.  She was then taken to the operating room where general anesthesia was administered and was found to be adequate.  She was placed in the dorsal lithotomy position, and was prepped and draped in a sterile manner.  The patients bladder was drained with a red rubber catheter. After an adequate timeout was performed, attention was turned to her pelvis.  A weighted speculum was then placed in the vagina, and the anterior and posterior lips of the cervix were grasped bilaterally with tenaculums.  The cervix was then injected circumferentially with a dilute Vasopression solution.  The cervix was then circumferentially incised, and the bladder was dissected off the pubocervical fascia without complication.  Th posterior cul-de-sac was entered sharply without difficulty. A suture was placed on the posterior vagina.  A long weighted speculum was inserted into the posterior cul-de-sac.  The Heaney clamp was then used to clamp the uterosacral ligaments on either side.  They were then cut and sutured ligated with 0 Vicryl, and the ligated uterosacral ligaments were transfixed to the posterior lateral vaginal epithelium to further support the vagina and provide hemostasis. Of note, all sutures used in this case were 0 Vicryl unless otherwise noted.   The cardinal ligaments were then clamped, cut and ligated. The anterior cul-de-sac was then entered sharpely. The uterine vessels and broad ligaments were then serially clamped with the Heaney clamps, cut, and suture ligated on both sides.  Excellent hemostasis was noted at this point.  The uterus was then delivered via the posterior  cul-de-sac, and the cornua were clamped with the Heaney clamps, transected, and the uterus was delivered and sent to pathology. These pedicles were then suture ligated to ensure hemostasis.  After completion of the hysterectomy, The fallopian tube on the right side was grasped with a Kelly clamp, transected and suture ligated. (the left fallopian tube was not visualized) All pedicles from the uterosacral ligament to the cornua were examined hemostasis was confirmed.  The vaginal cuff was reefed in a running locked fashion then reapproximated using figure of eight sutures care was given to incorporate the uterosacral pedicles bilaterally.  All instruments were then removed from the pelvis.  The patient tolerated the procedure well.  All instruments, needles, and sponge counts were correct x 2. The patient was taken to the recovery room in stable condition.  A foley catheter was placed at the completion of the procedure and the urine was clear.

## 2014-06-03 NOTE — H&P (Signed)
HPI Pt reports continued bleeding despite Provera. She reports that she is passing large clots. She recently had dental surgery for over 2 hours and was under anesthesia. She would like to have surgery to remove uterus and stop the bleeding completely. Pt s/p SVD x2 and has had no intraabdominal surgeries. She is here to discuss that and surg path from her recent Endo bx. She report that she does not like the way she feel on the provera.  She denies assoc sx with the bleeding except weakness that she attributes to her post op state.   She is still bleeding today.    Current Outpatient Prescriptions on File Prior to Visit   Medication  Sig  Dispense  Refill   .  albuterol (PROVENTIL HFA;VENTOLIN HFA) 108 (90 BASE) MCG/ACT inhaler  Inhale 2 puffs into the lungs every 6 (six) hours as needed. Wheezing  3.7 g  5   .  cycloSPORINE (RESTASIS) 0.05 % ophthalmic emulsion  Place 1 drop into both eyes 2 (two) times daily.     Marland Kitchen  esomeprazole (NEXIUM) 40 MG capsule  Take 40 mg by mouth daily at 12 noon.     .  furosemide (LASIX) 40 MG tablet  Take 40 mg by mouth daily.     Marland Kitchen  HYDROcodone-acetaminophen (NORCO/VICODIN) 5-325 MG per tablet  Take 1-2 tablets by mouth every 6 (six) hours as needed for moderate pain or severe pain.  40 tablet  0   .  lisinopril (PRINIVIL,ZESTRIL) 2.5 MG tablet  Take 1 tablet (2.5 mg total) by mouth daily.  30 tablet  6   .  medroxyPROGESTERone (PROVERA) 10 MG tablet  Take 2 tablets (20 mg total) by mouth daily.  60 tablet  2   .  metoprolol tartrate (LOPRESSOR) 25 MG tablet  Take 25-50 mg by mouth 2 (two) times daily. 50mg  AM  25mg  PM     .  potassium chloride (K-DUR) 10 MEQ tablet  Take 10 mEq by mouth daily.     .  rosuvastatin (CRESTOR) 20 MG tablet  Take 1 tablet (20 mg total) by mouth daily.  28 tablet  0   .  aspirin 81 MG tablet  Take 81 mg by mouth daily.     Marland Kitchen  ibuprofen (ADVIL,MOTRIN) 400 MG tablet  Take 1 tablet (400 mg total) by mouth every 8 (eight) hours as needed.   30 tablet  0   .  traMADol (ULTRAM) 50 MG tablet  Take 50 mg by mouth every 6 (six) hours as needed for moderate pain.      No current facility-administered medications on file prior to visit.   Review of Systems  Objective:   Physical Exam BP 134/73  Pulse 79  Temp(Src) 98.4 F (36.9 C) (Oral)  Resp 18  Ht 5\' 8"  (1.727 m)  Wt 295 lb (133.811 kg)  BMI 44.86 kg/m2  SpO2 100%  LMP 10/26/2013 Pt in NAD. Exam deferred  03/29/2014  Diagnosis  Endometrium, biopsy  - SIMPLE AND COMPLEX HYPERPLASIA WITH ATYPIA.  - SEE MICROSCOPIC DESCRIPTION.   CBC    Component Value Date/Time   WBC 7.5 06/03/2014 0818   RBC 3.76* 06/03/2014 0818   HGB 8.6* 06/03/2014 0818   HCT 26.8* 06/03/2014 0818   PLT 323 06/03/2014 0818   MCV 71.3* 06/03/2014 0818   MCH 22.9* 06/03/2014 0818   MCHC 32.1 06/03/2014 0818   RDW 17.6* 06/03/2014 0818   LYMPHSABS 2.2 01/31/2014 1039   MONOABS  0.5 01/31/2014 1039   EOSABS 0.5 01/31/2014 1039   BASOSABS 0.0 01/31/2014 1039     Assessment:   After review with the pathologist he believes that her atypia is getting worse but, reports that she does not have invasive disease. Discussed with patient definitive treatment with a hysterectomy versus attempting to continue conservative treatment. Her anemia cannot be good for her cardiac status and definitve treatment if she is able to tolerate surgery will likely improve her overall clinical status.  Change Provera to Megace To try to control cycles prior to procedure. Pt made aware that she MUST have clearance from Cardiology and her primary care physician prior to surgery  Plan:   Patient desires surgical management with [facility]. The risks of surgery were discussed in detail with the patient including but not limited to: bleeding which may require transfusion or reoperation; infection which may require prolonged hospitalization or re-hospitalization and antibiotic therapy; injury to bowel, bladder, ureters and major vessels or other  surrounding organs; need for additional procedures including laparotomy; thromboembolic phenomenon, incisional problems and other postoperative or anesthesia complications. Patient was told that the likelihood that her condition and symptoms will be treated effectively with this surgical management was very high; the postoperative expectations were also discussed in detail. The patient also understands the alternative treatment options which were discussed in full. All questions were answered.

## 2014-06-03 NOTE — Addendum Note (Signed)
Addendum created 06/03/14 1525 by Genevie Ann, CRNA   Modules edited: Notes Section   Notes Section:  File: [id number]

## 2014-06-03 NOTE — Anesthesia Procedure Notes (Signed)
Spinal  Patient location during procedure: OR Preanesthetic Checklist Completed: patient identified, site marked, surgical consent, pre-op evaluation, timeout performed, IV checked, risks and benefits discussed and monitors and equipment checked Spinal Block Patient position: sitting Prep: DuraPrep Patient monitoring: heart rate, cardiac monitor, continuous pulse ox and blood pressure Approach: midline Location: L3-4 Injection technique: single-shot Needle Needle type: Sprotte  Needle gauge: 24 G Needle length: 9 cm Needle insertion depth: 6 cm Assessment Sensory level: T8 Additional Notes Touhy / Sprotte with LOR @6cm  Spinal Dosage in OR  Bupivicaine ml       1.6 PFMS04   mcg        150

## 2014-06-04 ENCOUNTER — Encounter (HOSPITAL_COMMUNITY): Payer: Self-pay | Admitting: Obstetrics & Gynecology

## 2014-06-04 ENCOUNTER — Encounter: Payer: Self-pay | Admitting: Obstetrics & Gynecology

## 2014-06-04 DIAGNOSIS — N949 Unspecified condition associated with female genital organs and menstrual cycle: Secondary | ICD-10-CM | POA: Diagnosis not present

## 2014-06-04 DIAGNOSIS — N938 Other specified abnormal uterine and vaginal bleeding: Secondary | ICD-10-CM | POA: Diagnosis not present

## 2014-06-04 LAB — CBC
HEMATOCRIT: 28.4 % — AB (ref 36.0–46.0)
HEMOGLOBIN: 9.3 g/dL — AB (ref 12.0–15.0)
MCH: 24.1 pg — ABNORMAL LOW (ref 26.0–34.0)
MCHC: 32.7 g/dL (ref 30.0–36.0)
MCV: 73.6 fL — ABNORMAL LOW (ref 78.0–100.0)
Platelets: 275 10*3/uL (ref 150–400)
RBC: 3.86 MIL/uL — AB (ref 3.87–5.11)
RDW: 18.9 % — ABNORMAL HIGH (ref 11.5–15.5)
WBC: 13 10*3/uL — ABNORMAL HIGH (ref 4.0–10.5)

## 2014-06-04 LAB — BASIC METABOLIC PANEL
Anion gap: 12 (ref 5–15)
BUN: 10 mg/dL (ref 6–23)
CHLORIDE: 105 meq/L (ref 96–112)
CO2: 23 mEq/L (ref 19–32)
Calcium: 9.1 mg/dL (ref 8.4–10.5)
Creatinine, Ser: 0.73 mg/dL (ref 0.50–1.10)
GFR calc Af Amer: >90 mL/min (ref >90)
GFR calc non Af Amer: >90 mL/min (ref >90)
Glucose, Bld: 139 mg/dL — ABNORMAL HIGH (ref 70–99)
POTASSIUM: 4.1 meq/L (ref 3.7–5.3)
Sodium: 140 mEq/L (ref 137–147)

## 2014-06-04 LAB — TYPE AND SCREEN
ABO/RH(D): O POS
ANTIBODY SCREEN: NEGATIVE
UNIT DIVISION: 0
Unit division: 0

## 2014-06-04 NOTE — Discharge Summary (Signed)
Physician Discharge Summary  Patient ID: [name] MRN: [id number] DOB/AGE: 60-Sep-1957 60 y.o.  Admit date: 06/03/2014 Discharge date: 06/04/2014  Admission Diagnoses: see list with endometrial hyperplasia with progressive atypia  Discharge Diagnoses:  Active Problems:   Anemia due to chronic blood loss   Menopausal menorrhagia   Post-operative state   Discharged Condition: good  Hospital Course: Pt was admitted for total vaginal hysterectomy with right salpingectomy. She did very well post.  Several hours after surgery pt was pain free and has required very little pain meds.  She was able to tolerate a regular diet.  She received 2 units of blood due to chronic anemia.  In a pt with heart disease this was thought best for her overall care.  She denies chest pain and reports that outside of a mild sensation at the base of her pelvis she felt 'normal'.  Pt still had a foley catheter but, reports that she is ready for discharge.   Consults: None  Significant Diagnostic Studies: labs: CBC  Treatments: IV hydration, analgesia: acetaminophen w/ codeine and Morphine and surgery: Total vaginal hysterectomy with right salpingectomy Blood transfusion- 2 units of PRBC's   Discharge Exam: Blood pressure 138/75, pulse 82, temperature 98.7 F (37.1 C), temperature source Oral, resp. rate 18, height 5\' 8"  (1.727 m), weight 295 lb (133.811 kg), last menstrual period 10/26/2013, SpO2 100.00%. General appearance: alert and no distress Resp: clear to auscultation bilaterally Cardio: regular rate and rhythm, S1, S2 normal, no murmur, click, rub or gallop GI: soft, non-tender; bowel sounds normal; no masses,  no organomegaly Extremities: extremities normal, atraumatic, no cyanosis or edema CBC    Component Value Date/Time   WBC 13.0* 06/04/2014 0550   RBC 3.86* 06/04/2014 0550   HGB 9.3* 06/04/2014 0550   HCT 28.4* 06/04/2014 0550   PLT 275 06/04/2014 0550   MCV 73.6* 06/04/2014 0550   MCH 24.1*  06/04/2014 0550   MCHC 32.7 06/04/2014 0550   RDW 18.9* 06/04/2014 0550   LYMPHSABS 2.2 01/31/2014 1039   MONOABS 0.5 01/31/2014 1039   EOSABS 0.5 01/31/2014 1039   BASOSABS 0.0 01/31/2014 1039     Disposition: 01-Home or Self Care  Discharge Instructions   Call MD for:  difficulty breathing, headache or visual disturbances    Complete by:  As directed      Call MD for:  extreme fatigue    Complete by:  As directed      Call MD for:  hives    Complete by:  As directed      Call MD for:  persistant dizziness or light-headedness    Complete by:  As directed      Call MD for:  persistant nausea and vomiting    Complete by:  As directed      Call MD for:  redness, tenderness, or signs of infection (pain, swelling, redness, odor or green/yellow discharge around incision site)    Complete by:  As directed      Call MD for:  severe uncontrolled pain    Complete by:  As directed      Call MD for:  temperature >100.4    Complete by:  As directed      Call MD for:    Complete by:  As directed   Heavy vaginal bleeding     Diet - low sodium heart healthy    Complete by:  As directed      Driving Restrictions    Complete by:  As directed   No driving for 2 weeks or while on pain meds by mouth     Increase activity slowly    Complete by:  As directed      Lifting restrictions    Complete by:  As directed   No heavy lifting for 6 weeks     May shower / Bathe    Complete by:  As directed   May shower NO tubs baths     Sexual Activity Restrictions    Complete by:  As directed   No sexual activity for 6 weeks            Medication List    STOP taking these medications       megestrol 20 MG tablet  Commonly known as:  MEGACE      TAKE these medications       albuterol 108 (90 BASE) MCG/ACT inhaler  Commonly known as:  PROVENTIL HFA;VENTOLIN HFA  Inhale 2 puffs into the lungs every 6 (six) hours as needed. Wheezing     cycloSPORINE 0.05 % ophthalmic emulsion  Commonly known  as:  RESTASIS  Place 1 drop into both eyes 2 (two) times daily.     diclofenac sodium 1 % Gel  Commonly known as:  VOLTAREN  Apply 2 g topically 2 (two) times daily as needed (joint pain).     esomeprazole 40 MG capsule  Commonly known as:  NEXIUM  Take 40 mg by mouth daily at 12 noon.     furosemide 40 MG tablet  Commonly known as:  LASIX  Take 40 mg by mouth daily.     HYDROcodone-acetaminophen 5-325 MG per tablet  Commonly known as:  NORCO/VICODIN  Take 1-2 tablets by mouth every 6 (six) hours as needed for moderate pain or severe pain.     ibuprofen 400 MG tablet  Commonly known as:  ADVIL,MOTRIN  Take 400 mg by mouth every 8 (eight) hours as needed for mild pain.     lisinopril 2.5 MG tablet  Commonly known as:  PRINIVIL,ZESTRIL  Take 1 tablet (2.5 mg total) by mouth daily.     metoprolol tartrate 25 MG tablet  Commonly known as:  LOPRESSOR  Take 25 mg by mouth 2 (two) times daily.     potassium chloride 10 MEQ tablet  Commonly known as:  K-DUR  Take 10 mEq by mouth daily.     rosuvastatin 20 MG tablet  Commonly known as:  CRESTOR  Take 1 tablet (20 mg total) by mouth daily.     traMADol 50 MG tablet  Commonly known as:  ULTRAM  Take 50 mg by mouth every 6 (six) hours as needed for moderate pain.           Follow-up Information   Follow up with Lavonia Drafts, MD In 2 weeks.   Specialty:  Obstetrics and Gynecology   Contact information:   [place] [address] [phone]     Must void and tolerate regular diet prior to discharge  Signed: HARRAWAY-SMITH, Katalia Choma 06/04/2014, 10:16 AM

## 2014-06-04 NOTE — Progress Notes (Signed)
Pt discharged to home with teenage granddaughter.  Pt and granddaughter were picked up by adult female family member.  Pt's condition stable.  Pt to car via wheelchair with Astrid Divine, NT.  No equipment for home ordered at discharge.

## 2014-06-04 NOTE — Progress Notes (Signed)
UR chart review completed.  

## 2014-06-04 NOTE — Discharge Instructions (Signed)
Laparoscopically Assisted Vaginal Hysterectomy, Care After °Refer to this sheet in the next few weeks. These instructions provide you with information on caring for yourself after your procedure. Your health care provider may also give you more specific instructions. Your treatment has been planned according to current medical practices, but problems sometimes occur. Call your health care provider if you have any problems or questions after your procedure. °WHAT TO EXPECT AFTER THE PROCEDURE °After your procedure, it is typical to have the following: °· Abdominal pain. You will be given pain medicine to control it. °· Sore throat from the breathing tube that was inserted during surgery. °HOME CARE INSTRUCTIONS °· Only take over-the-counter or prescription medicines for pain, discomfort, or fever as directed by your health care provider. °· Do not take aspirin. It can cause bleeding. °· Do not drive when taking pain medicine. °· Follow your health care provider's advice regarding diet, exercise, lifting, driving, and general activities. °· Resume your usual diet as directed and allowed. °· Get plenty of rest and sleep. °· Do not douche, use tampons, or have sexual intercourse for at least 6 weeks, or until your health care provider gives you permission. °· Change your bandages (dressings) as directed by your health care provider. °· Monitor your temperature and notify your health care provider of a fever. °· Take showers instead of baths for 2-3 weeks. °· Do not drink alcohol until your health care provider gives you permission. °· If you develop constipation, you may take a mild laxative with your health care provider's permission. Bran foods may help with constipation problems. Drinking enough fluids to keep your urine clear or pale yellow may help as well. °· Try to have someone home with you for 1-2 weeks to help around the house. °· Keep all of your follow-up appointments as directed by your health care  provider. °SEEK MEDICAL CARE IF:  °· You have swelling, redness, or increasing pain around your incision sites. °· You have pus coming from your incision. °· You notice a bad smell coming from your incision. °· Your incision breaks open. °· You feel dizzy or lightheaded. °· You have pain or bleeding when you urinate. °· You have persistent diarrhea. °· You have persistent nausea and vomiting. °· You have abnormal vaginal discharge. °· You have a rash. °· You have any type of abnormal reaction or develop an allergy to your medicine. °· You have poor pain control with your prescribed medicine. °SEEK IMMEDIATE MEDICAL CARE IF:  °· You have a fever. °· You have severe abdominal pain. °· You have chest pain. °· You have shortness of breath. °· You faint. °· You have pain, swelling, or redness in your leg. °· You have heavy vaginal bleeding with blood clots. °MAKE SURE YOU: °· Understand these instructions. °· Will watch your condition. °· Will get help right away if you are not doing well or get worse. °Document Released: 10/28/2011 Document Revised: 11/13/2013 Document Reviewed: 05/24/2013 °Exit[employer]® Patient Information ©2015 [employer]. This information is not intended to replace advice given to you by your health care provider. Make sure you discuss any questions you have with your health care provider. ° °

## 2014-06-05 ENCOUNTER — Encounter: Payer: Self-pay | Admitting: *Deleted

## 2014-06-13 ENCOUNTER — Telehealth: Payer: Self-pay | Admitting: Obstetrics & Gynecology

## 2014-06-17 ENCOUNTER — Encounter: Payer: Self-pay | Admitting: Obstetrics & Gynecology

## 2014-06-17 ENCOUNTER — Ambulatory Visit: Payer: Medicare Other | Admitting: Obstetrics & Gynecology

## 2014-06-17 VITALS — BP 140/74 | HR 79 | Temp 98.1°F | Ht 66.0 in | Wt 293.2 lb

## 2014-06-17 DIAGNOSIS — Z9889 Other specified postprocedural states: Secondary | ICD-10-CM

## 2014-06-17 NOTE — Patient Instructions (Signed)
Uterine Cancer Uterine cancer is an abnormal growth of tissue (tumor) in the uterus that is cancerous (malignant). Unlike noncancerous (benign) tumors, malignant tumors can spread to other parts of your body. The wall of the uterus has two layers of tissue. The inner layer is the endometrium. The outer layer of muscle tissue is the myometrium. The most common type of uterine cancer begins in the endometrium. This is called endometrial cancer. Cancer that begins in the myometrium is called uterine sarcoma, which is very rare.  RISK FACTORS  Although the exact cause of uterine cancer is unknown, there are a number of risk factors that can increase your chances of getting uterine cancer. They include:  Your age. Uterine cancer occurs mostly in women older than 50 years.   Having an enlarged endometrium (endometrial hyperplasia).   Using hormone therapy.   Obesity.   Taking the drug tamoxifen.   White race.   Infertility.   Never being pregnant.   Beginning menstrual periods at an age younger than 12 years.   Having menstrual periods at an age older than 52 years.   Personal history of ovarian, intestinal, or colorectal cancer.   Having a family history of uterine cancer.   Having a family history of hereditary nonpolyposis colon cancer (HNPCC).   Having diabetes, high blood pressure, thyroid disease, or gallbladder disease.   Long-term use of high-dose birth control pills.   Exposure to radiation.   Smoking.  SIGNS AND SYMPTOMS   Abnormal vaginal bleeding or discharge. Bleeding may start as a watery, blood-streaked flow that gradually contains more blood.   Any vaginal bleeding after menopause.   Difficult or painful urination.   Pain during intercourse.   Pain in the pelvic area.  Mass in the vagina.  Pain or fullness in the abdomen.  Frequent urination.  Bleeding between periods.  Growth of the stomach.   Unexplained weight loss.   Uterine cancer usually occurs after menopause. However, it may also occur around the time that menopause begins. Abnormal vaginal bleeding is the most common symptom of uterine cancer. Women should not assume that abnormal vaginal bleeding is part of menopause. DIAGNOSIS  Your health care provider will ask about your medical history. He or she may also perform a number of procedures, such as:  A physical and pelvic exam. Your health care provider will feel your pelvis for any lumps.   Blood and urine tests.   X-rays.   Imaging tests, such as CT scans, ultrasonography, or MRIs.   A hysteroscopy to view the inside of your uterus.   A Pap test to sample cells from the cervix and upper vagina to check for abnormal cells.   Taking a tissue sample (biopsy) from the uterine lining to look for cancer cells.   A dilation and curettage (D&C). This involves stretching (dilation) the cervix and scraping (curettage) the inside lining of the uterus to get a tissue sample. The sample is examined under a microscope to look for cancer cells.  Your cancer will be staged to determine its severity and extent. Staging is a careful attempt to find out the size of the tumor, whether the cancer has spread, and if so, to what parts of the body. You may need to have more tests to determine the stage of your cancer. The test results will help determine what treatment plan is best for you. Cancer stages include:   Stage I. The cancer is only found in the uterus.  Stage II. The   cancer has spread to the cervix.  Stage III. The cancer has spread outside the uterus, but not outside the pelvis. The cancer may have spread to the lymph nodes in the pelvis.  Stage IV. The cancer has spread to other parts of the body, such as the bladder or rectum. TREATMENT  Most women with uterine cancer are treated with surgery. This includes removing the uterus, cervix, fallopian tubes, and ovaries (total hysterectomy). Your  lymph nodes near the tumor may also be removed. Some women have radiation, chemotherapy, or hormonal therapy. Other women have a combination of these therapies. HOME CARE INSTRUCTIONS   Take medicines only as directed by your health care provider.   Maintain a healthy diet.  Exercise regularly.   If you have diabetes, high blood pressure, thyroid disease, or gallbladder disease, follow your health care provider's instructions to keep it under control.   Do not smoke.   Consider joining a support group. This may help you learn to cope with the stress of having uterine cancer.   Seek advice to help you manage treatment side effects.   Keep all follow-up visits as directed by your health care provider.  SEEK MEDICAL CARE IF:  You have increased stomach or pelvic pain.  You cannot urinate.  You have abnormal bleeding. Document Released: 11/08/2005 Document Revised: 03/25/2014 Document Reviewed: 04/27/2013 Exit[employer] Patient Information 2015 [employer]. This information is not intended to replace advice given to you by your health care provider. Make sure you discuss any questions you have with your health care provider.  

## 2014-06-17 NOTE — Progress Notes (Signed)
Subjective:     Patient ID: [name], female   DOB: 1956-11-13, 58 y.o.   MRN: [id number]  HPI Pt reports that she had minimal pain post op.  She is voiding without difficulty.  She does c/o constipation that was a problem preop.  She reports that she is tolerating diet well.   Review of Systems     Objective:   Physical Exam BP 140/74  Pulse 79  Temp(Src) 98.1 F (36.7 C) (Oral)  Ht 5\' 6"  (1.676 m)  Wt 293 lb 3.2 oz (132.995 kg)  BMI 47.35 kg/m2  LMP 01/13/2014 Pt in NAD Abd; obese, NT; ND. No rebound or guarding      06/03/2014 Diagnosis Uterus and cervix, with right fallopian tube (160.3 grams) - SUPERFICIALLY INVASIVE ENDOMETRIOID CARCINOMA, FIGO GRADE I, ARISING IN A BACKGROUND ATYPICAL COMPLEX HYPERPLASIA WITH EXOGENOUS HORMONAL EFFECT. - ADENOMYOSIS AND LEIOMYOMATA. - UTERINE SEROSA: UNREMARKABLE. - CERVIX: BENIGN SQUAMOUS MUCOSA AND ENDOCERVICAL MUCOSA, NO DYSPLASIA OR MALIGNANCY. - RIGHT FALLOPIAN TUBE: BENIGN FALLOPIAN TUBAL TISSUE, NO ATYPIA OR MALIGNANCY. Assessment:     2 weeks post op check Reviewed pathology with pt.  Discussed with pt referral to [facility]YN [facility] to further review disease process and recommended f/u.  Pt is aware that she will need a Bilateral oophorectomy and left salpingectomy.  Pt didn't seem worried at all about diagnosis. I reiterated it to confirm that she understood.  She reports that as long as it is out she is not worried.  Agrees to see GYN [facility]         Plan:     Gradual increase in activities Nothing per vagina F/u in 4 weeks for post op check Referral to [facility]

## 2014-06-17 NOTE — Telephone Encounter (Signed)
Cancel encounter

## 2014-06-18 ENCOUNTER — Telehealth: Payer: Self-pay | Admitting: *Deleted

## 2014-06-18 NOTE — Telephone Encounter (Signed)
Phoned patient to make her aware of appointment with [facility] on 06/24/14 at 9:15 am.  Patient states understanding.

## 2014-06-24 ENCOUNTER — Ambulatory Visit: Payer: Medicare Other | Attending: Gynecologic Oncology | Admitting: Gynecologic Oncology

## 2014-06-24 ENCOUNTER — Encounter: Payer: Self-pay | Admitting: Gynecologic Oncology

## 2014-06-24 VITALS — BP 146/86 | HR 67 | Temp 98.6°F | Resp 16 | Ht 68.0 in | Wt 295.4 lb

## 2014-06-24 DIAGNOSIS — I2510 Atherosclerotic heart disease of native coronary artery without angina pectoris: Secondary | ICD-10-CM | POA: Diagnosis not present

## 2014-06-24 DIAGNOSIS — C541 Malignant neoplasm of endometrium: Secondary | ICD-10-CM

## 2014-06-24 DIAGNOSIS — I10 Essential (primary) hypertension: Secondary | ICD-10-CM | POA: Insufficient documentation

## 2014-06-24 DIAGNOSIS — M810 Age-related osteoporosis without current pathological fracture: Secondary | ICD-10-CM | POA: Insufficient documentation

## 2014-06-24 DIAGNOSIS — C55 Malignant neoplasm of uterus, part unspecified: Secondary | ICD-10-CM | POA: Diagnosis not present

## 2014-06-24 DIAGNOSIS — I252 Old myocardial infarction: Secondary | ICD-10-CM | POA: Insufficient documentation

## 2014-06-24 DIAGNOSIS — Z951 Presence of aortocoronary bypass graft: Secondary | ICD-10-CM | POA: Diagnosis not present

## 2014-06-24 DIAGNOSIS — C549 Malignant neoplasm of corpus uteri, unspecified: Secondary | ICD-10-CM

## 2014-06-24 NOTE — Patient Instructions (Signed)
We will call you with your Scan results.

## 2014-06-24 NOTE — Progress Notes (Signed)
Consult Note: Gyn-Onc  Consult was requested by Dr. Ihor Dow for the evaluation of [name] 58 y.o. female with clinical stage I grade 1 endometrial cancer.  CC:  Chief Complaint  Patient presents with  . Uterine Cancer    New Consults    Assessment/Plan:  [name]  is a 58 y.o.  year old who is seen in consultation at the request of Dr.Harraway-Smith for clinical stage I, grade 1 endometrial cancer.  [name] underwent a vaginal hysterectomy with right salpingo-oophorectomy on 06/03/2014. While her ovaries and left fallopian tube remain in situ, her uterine factors a suggestive of low risk features (grade 1 tumor, less than 2 cm in greatest dimension, inner half myometrial invasion, age younger than 49). I believe that she has a less than one in 20 risk for having occult metastatic disease in her residual or recent fallopian tubes, and even lower risk for having occult metastatic disease in the lymph nodes. Therefore given her her prior history of coronary artery disease, and recent surgery, I believe that the risk for surgical morbidity outweighs the benefit of her reoperation and restaging procedure. Instead I am recommending a CT scan of the abdomen and pelvis to evaluate the lymph nodes for lymphadenopathy, and to better in addition the ovaries to rule out ovarian masses (as the ovaries were not clearly visualized on preoperative ultrasound). If the CT scan failed to show concern for metastatic disease, [name] can be assumed to have a clinical stage I grade 1 endometrial cancer she can be dispositioned to close followup.  According to SGO recommendations for surveillance of early stage endometrial cancer I would recommend pelvic examination, physical examination, and symptom survey every 6 months for 5 years. Imaging of the abdomen pelvis and chest should take place based on patient reports a physician findings the concerning for metastatic disease.   HPI:  [name] is a  58 year old woman who has a history of abnormal uterine bleeding in the past year. She had gone through menopause approximately 11 years ago had not had vaginal bleeding during that time. In February 2015 she underwent a coronary artery bypass surgery with five-vessel grafts. She was placed on aspirin after the surgery. Was after this time that her vaginal bleeding began.  She underwent endometrial sampling with Dr Ihor Dow which revealed complex atypical hyperplasia. Transvaginal ultrasound documented a thickened endometrial stripe. No ovaries were clearly seen on ultrasound, however no masses were identified either. On 06/03/2014 she underwent a vaginal hysterectomy with right salpingectomy. Visualization of the ovaries and left fallopian tube were limited due to body habitus. Final pathology revealed a simple facial invasive (0.1 cm of 2.6 cm myometrial thickness) , largest focus measuring 1.5 cm, FIGO grade 1 features. There was no lymphovascular space invasion identified. The cervical stroma was negative for invasive malignancy. The fallopian tube was also benign.  Interval History:  she's been healing well from surgery with no postoperative complications  Current Meds:  Outpatient Encounter Prescriptions as of 06/24/2014  Medication Sig  . albuterol (PROVENTIL HFA;VENTOLIN HFA) 108 (90 BASE) MCG/ACT inhaler Inhale 2 puffs into the lungs every 6 (six) hours as needed. Wheezing  . cycloSPORINE (RESTASIS) 0.05 % ophthalmic emulsion Place 1 drop into both eyes 2 (two) times daily.  . diclofenac sodium (VOLTAREN) 1 % GEL Apply 2 g topically 2 (two) times daily as needed (joint pain).  Marland Kitchen esomeprazole (NEXIUM) 40 MG capsule Take 40 mg by mouth daily at 12 noon.  Marland Kitchen HYDROcodone-acetaminophen (NORCO/VICODIN)  5-325 MG per tablet Take 1-2 tablets by mouth every 6 (six) hours as needed for moderate pain or severe pain.  Marland Kitchen ibuprofen (ADVIL,MOTRIN) 400 MG tablet Take 400 mg by mouth every 8 (eight) hours as  needed for mild pain.  Marland Kitchen lisinopril (PRINIVIL,ZESTRIL) 2.5 MG tablet Take 1 tablet (2.5 mg total) by mouth daily.  . medroxyPROGESTERone (PROVERA) 10 MG tablet   . metoprolol tartrate (LOPRESSOR) 25 MG tablet Take 25 mg by mouth 2 (two) times daily.  . potassium chloride (K-DUR) 10 MEQ tablet Take 10 mEq by mouth daily.  . rosuvastatin (CRESTOR) 20 MG tablet Take 1 tablet (20 mg total) by mouth daily.  . traMADol (ULTRAM) 50 MG tablet Take 50 mg by mouth every 6 (six) hours as needed for moderate pain.  . furosemide (LASIX) 40 MG tablet Take 40 mg by mouth daily.  . [DISCONTINUED] megestrol (MEGACE) 40 MG tablet     Allergy:  Allergies  Allergen Reactions  . Cephalosporins Anaphylaxis    Tongue swelling, gum pain  . Codeine Other (See Comments)    hallucinations  . Latex Itching  . Penicillins Other (See Comments)    Childhood allergy but pt thinks she is no longer allergic  . Prednisone Other (See Comments)    Heart beating fast       Social Hx:   History   Social History  . Marital Status: Single    Spouse Name: N/A    Number of Children: 2  . Years of Education: N/A   Occupational History  . Retired    Social History Main Topics  . Smoking status: Never Smoker   . Smokeless tobacco: Never Used  . Alcohol Use: No  . Drug Use: No  . Sexual Activity: Not Currently   Other Topics Concern  . Not on file   Social History Narrative  . No narrative on file    Past Surgical Hx:  Past Surgical History  Procedure Laterality Date  . Knee surgery Bilateral 1999  . Total knee arthroplasty Left 04/29/2011  . Multiple tooth extractions    . Wisdom tooth extraction    . Colonoscopy  2009  . Knee arthroscopy  1991  . Transanal hemorrhoidal dearterliaization  01/06/12    with external hemorrhoid removal  . Coronary artery bypass graft N/A 01/09/2014    Procedure: CORONARY ARTERY BYPASS GRAFTING (CABG) x 5 using left internal mammary artery and right leg greater saphenous  vein harvested endoscopically;  Surgeon: Ivin Poot, MD;  Location: [facility];  Service: Open Heart Surgery;  Laterality: N/A;  please use bed extenders and breast binder  . Intraoperative transesophageal echocardiogram N/A 01/09/2014    Procedure: INTRAOPERATIVE TRANSESOPHAGEAL ECHOCARDIOGRAM;  Surgeon: Ivin Poot, MD;  Location: [place];  Service: Open Heart Surgery;  Laterality: N/A;  . Tee without cardioversion  01/2010    EF 60-65%, small, flat, non-infiltrating, calcified, fixed apical/septal mass  . Nm myocar perf wall motion  01/2010    dipyridamole myoview - moderate perfusion defect in basal inferoseptal, basal inferior, mid inferoseptal, mid inferior, apical inferior region; EF 56%  . Cardiac catheterization    . Multiple extractions with alveoloplasty N/A 04/11/2014    Procedure: Extraction of tooth #'s 1,2,8,16 with alveoloplasty, maxillary tuberosity reductions, and gross debridement of remaining teeth.;  Surgeon: Lenn Cal, DDS;  Location: [facility];  Service: Oral Surgery;  Laterality: N/A;  . Vaginal hysterectomy N/A 06/03/2014    Procedure: HYSTERECTOMY VAGINAL;  Surgeon: Lavonia Drafts,  MD;  Location: [facility];  Service: Gynecology;  Laterality: N/A;  . Unilateral salpingectomy Right 06/03/2014    Procedure: UNILATERAL SALPINGECTOMY;  Surgeon: Lavonia Drafts, MD;  Location: [facility];  Service: Gynecology;  Laterality: Right;    Past Medical Hx:  Past Medical History  Diagnosis Date  . Seasonal allergies   . Asthma     History of  . Hypertension   . Iron deficiency 12-07-2011  . Anemia   . GERD (gastroesophageal reflux disease)   . Heart murmur   . Osteoporosis   . PONV (postoperative nausea and vomiting)   . Dysrhythmia     occ palpitations   . Sleep apnea     no CPAP machine; sleep study 02/2010 REM AHI 61.7/hr, total sleep REM 14.8/hr  . Headache(784.0)     occasional   . Osteoarthritis   . Morbid obesity   . Acute urinary retention s/p Foley  01/07/2012  . Hemorrhoids, internal, with bleeding & prolapse 12/13/2011  . Myocardial infarction     unsure when; per cardiologist report  . Chronic headaches   . Coronary artery disease   . Anginal pain   . Dyslipidemia   . S/P CABG (coronary artery bypass graft)     x5 - LIMA to LAD, SVG to diagonal, SVG to OM1, SVG to OM2, SVG to PDA (Dr. Prescott Gum)  . Postmenopausal bleeding   . Neuropathy   . Blood dyscrasia     ABNORMAL VAGINAL BLEEDING  . Uterine cancer 7/15    clinical stage IA grade 1 endometrioid endometrial cancer    Past Gynecological History:  Uterine fibroids, endometrial hyperplasia and grade 1 endometrial cancer.  Patient's last menstrual period was 01/13/2014.  Family Hx:  Family History  Problem Relation Age of Onset  . Colon cancer Neg Hx   . Hypertension Sister   . Heart failure Sister   . Heart disease Sister   . Diabetes type II Sister   . Kidney disease Brother     hypertension  . Heart attack Mother 26  . Heart disease Mother   . Hypertension Mother   . Diabetes Mother   . Hypertension Child     Review of Systems:  Constitutional  Feels well,    ENT Normal appearing ears and nares bilaterally Skin/Breast  No rash, sores, jaundice, itching, dryness Cardiovascular  No chest pain, shortness of breath, or edema  Pulmonary  No cough or wheeze.  Gastro Intestinal  No nausea, vomitting, or diarrhoea. No bright red blood per rectum, no abdominal pain, change in bowel movement, or constipation.  Genito Urinary  No frequency, urgency, dysuria, see HPI Musculo Skeletal  No myalgia, arthralgia, joint swelling or pain  Neurologic  No weakness, numbness, change in gait,  Psychology  No depression, anxiety, insomnia.   Vitals:  Blood pressure 146/86, pulse 67, temperature 98.6 F (37 C), temperature source Oral, resp. rate 16, height 5\' 8"  (1.727 m), weight 295 lb 6.4 oz (133.993 kg), last menstrual period 01/13/2014.  Physical Exam: WD in  NAD Neck  Supple NROM, without any enlargements.  Lymph Node Survey No cervical supraclavicular or inguinal adenopathy Cardiovascular  Pulse normal rate, regularity and rhythm. S1 and S2 normal.  Lungs  Clear to auscultation bilateraly, without wheezes/crackles/rhonchi. Good air movement.  Skin  No rash/lesions/breakdown  Psychiatry  Alert and oriented to person, place, and time  Abdomen  Normoactive bowel sounds, abdomen soft, non-tender and obese without evidence of hernia.  Back No CVA tenderness Genito  Urinary  Vulva/vagina: Normal external female genitalia.  No lesions. No discharge or bleeding.  Bladder/urethra:  No lesions or masses, well supported bladder  Vagina: vaginal cuff with sutures in tact, no lesions suggestive of vaginal mets.  Cervix: surgically absent  Uterus: surgically absent   Adnexa: no palpable masses. Rectal  Good tone, no masses no cul de sac nodularity.  Extremities  No bilateral cyanosis, clubbing or edema.  Tracy Eva, MD   06/24/2014, 10:37 AM

## 2014-06-27 ENCOUNTER — Encounter (HOSPITAL_COMMUNITY): Payer: Self-pay

## 2014-06-27 ENCOUNTER — Telehealth: Payer: Self-pay | Admitting: Gynecologic Oncology

## 2014-06-27 ENCOUNTER — Ambulatory Visit (HOSPITAL_COMMUNITY)
Admission: RE | Admit: 2014-06-27 | Discharge: 2014-06-27 | Disposition: A | Discharge status: Home / Self Care | Payer: Medicare Other | Source: Ambulatory Visit | Attending: Gynecologic Oncology | Admitting: Gynecologic Oncology

## 2014-06-27 DIAGNOSIS — M47817 Spondylosis without myelopathy or radiculopathy, lumbosacral region: Secondary | ICD-10-CM | POA: Insufficient documentation

## 2014-06-27 DIAGNOSIS — M47814 Spondylosis without myelopathy or radiculopathy, thoracic region: Secondary | ICD-10-CM | POA: Insufficient documentation

## 2014-06-27 DIAGNOSIS — C549 Malignant neoplasm of corpus uteri, unspecified: Secondary | ICD-10-CM | POA: Insufficient documentation

## 2014-06-27 DIAGNOSIS — M5146 Schmorl's nodes, lumbar region: Secondary | ICD-10-CM | POA: Diagnosis not present

## 2014-06-27 DIAGNOSIS — C55 Malignant neoplasm of uterus, part unspecified: Secondary | ICD-10-CM

## 2014-06-27 DIAGNOSIS — Z90710 Acquired absence of both cervix and uterus: Secondary | ICD-10-CM | POA: Diagnosis not present

## 2014-06-27 DIAGNOSIS — I700 Atherosclerosis of aorta: Secondary | ICD-10-CM | POA: Diagnosis not present

## 2014-06-27 MED ORDER — IOHEXOL 300 MG/ML  SOLN
125.0000 mL | Freq: Once | INTRAMUSCULAR | Status: AC | PRN
  Administered 2014-06-27: 125 mL via INTRAVENOUS

## 2014-06-27 NOTE — Telephone Encounter (Signed)
Informed patient of  Normal  CT scan showing no evidence of mets to ovaries or lymph nodes.  Recommend close followup. I discussed that CT scan accuracy can identify lesions greater than 1 cubic centimeter. However given her low risk features on uterine pathology I believe that no adjuvant therapy or additional surgery is indicated in this patient.  Donaciano Eva, MD

## 2014-07-15 ENCOUNTER — Ambulatory Visit: Payer: Self-pay | Admitting: Obstetrics & Gynecology

## 2014-07-17 ENCOUNTER — Ambulatory Visit: Payer: Self-pay | Admitting: Obstetrics & Gynecology

## 2014-07-22 ENCOUNTER — Ambulatory Visit (INDEPENDENT_AMBULATORY_CARE_PROVIDER_SITE_OTHER): Payer: Medicare Other | Admitting: Obstetrics & Gynecology

## 2014-07-22 ENCOUNTER — Encounter: Payer: Self-pay | Admitting: Obstetrics & Gynecology

## 2014-07-22 VITALS — BP 136/67 | HR 89 | Temp 98.5°F | Ht 68.0 in | Wt 302.5 lb

## 2014-07-22 DIAGNOSIS — D500 Iron deficiency anemia secondary to blood loss (chronic): Secondary | ICD-10-CM

## 2014-07-22 LAB — CBC
HCT: 30.7 % — ABNORMAL LOW (ref 36.0–46.0)
Hemoglobin: 9.7 g/dL — ABNORMAL LOW (ref 12.0–15.0)
MCH: 23 pg — ABNORMAL LOW (ref 26.0–34.0)
MCHC: 31.6 g/dL (ref 30.0–36.0)
MCV: 72.9 fL — AB (ref 78.0–100.0)
PLATELETS: 317 10*3/uL (ref 150–400)
RBC: 4.21 MIL/uL (ref 3.87–5.11)
RDW: 21.4 % — AB (ref 11.5–15.5)
WBC: 7.2 10*3/uL (ref 4.0–10.5)

## 2014-07-22 NOTE — Progress Notes (Signed)
Subjective:     Patient ID: [name], female   DOB: [birth year], 58 y.o.   MRN: [id number]  HPI Pt is 5-6 weeks post op from [facility].  She has seen GYN ONC and has decided that no further tx is not needed.  She has no complaints.      Review of Systems     Objective:   Physical Exam BP 136/67  Pulse 89  Temp(Src) 98.5 F (36.9 C)  Ht 5\' 8"  (1.727 m)  Wt 302 lb 8 oz (137.213 kg)  BMI 46.01 kg/m2  LMP 01/13/2014 Abd; soft, NT, ND GU: EGBUS: no lesions Vagina: no blood in vault Cervix/Uterus: surgically absent Adnexa: no masses; non tender   CBC    Component Value Date/Time   WBC 13.0* 06/04/2014 0550   RBC 3.86* 06/04/2014 0550   HGB 9.3* 06/04/2014 0550   HCT 28.4* 06/04/2014 0550   PLT 275 06/04/2014 0550   MCV 73.6* 06/04/2014 0550   MCH 24.1* 06/04/2014 0550   MCHC 32.7 06/04/2014 0550   RDW 18.9* 06/04/2014 0550   LYMPHSABS 2.2 01/31/2014 1039   MONOABS 0.5 01/31/2014 1039   EOSABS 0.5 01/31/2014 1039   BASOSABS 0.0 01/31/2014 1039        Assessment:     5 week post op check s/p TVH with salpingectomy.  Pt doing well but, was dx'd with Stage I endometrial cancer. Gyn ONC notes results     Plan:     F/u in 6 months and q 21months for 5 years CBC today

## 2014-07-22 NOTE — Patient Instructions (Signed)
Uterine Cancer Uterine cancer is an abnormal growth of tissue (tumor) in the uterus that is cancerous (malignant). Unlike noncancerous (benign) tumors, malignant tumors can spread to other parts of your body. The wall of the uterus has two layers of tissue. The inner layer is the endometrium. The outer layer of muscle tissue is the myometrium. The most common type of uterine cancer begins in the endometrium. This is called endometrial cancer. Cancer that begins in the myometrium is called uterine sarcoma, which is very rare.  RISK FACTORS  Although the exact cause of uterine cancer is unknown, there are a number of risk factors that can increase your chances of getting uterine cancer. They include:  Your age. Uterine cancer occurs mostly in women older than 50 years.   Having an enlarged endometrium (endometrial hyperplasia).   Using hormone therapy.   Obesity.   Taking the drug tamoxifen.   White race.   Infertility.   Never being pregnant.   Beginning menstrual periods at an age younger than 12 years.   Having menstrual periods at an age older than 52 years.   Personal history of ovarian, intestinal, or colorectal cancer.   Having a family history of uterine cancer.   Having a family history of hereditary nonpolyposis colon cancer (HNPCC).   Having diabetes, high blood pressure, thyroid disease, or gallbladder disease.   Long-term use of high-dose birth control pills.   Exposure to radiation.   Smoking.  SIGNS AND SYMPTOMS   Abnormal vaginal bleeding or discharge. Bleeding may start as a watery, blood-streaked flow that gradually contains more blood.   Any vaginal bleeding after menopause.   Difficult or painful urination.   Pain during intercourse.   Pain in the pelvic area.  Mass in the vagina.  Pain or fullness in the abdomen.  Frequent urination.  Bleeding between periods.  Growth of the stomach.   Unexplained weight loss.   Uterine cancer usually occurs after menopause. However, it may also occur around the time that menopause begins. Abnormal vaginal bleeding is the most common symptom of uterine cancer. Women should not assume that abnormal vaginal bleeding is part of menopause. DIAGNOSIS  Your health care provider will ask about your medical history. He or she may also perform a number of procedures, such as:  A physical and pelvic exam. Your health care provider will feel your pelvis for any lumps.   Blood and urine tests.   X-rays.   Imaging tests, such as CT scans, ultrasonography, or MRIs.   A hysteroscopy to view the inside of your uterus.   A Pap test to sample cells from the cervix and upper vagina to check for abnormal cells.   Taking a tissue sample (biopsy) from the uterine lining to look for cancer cells.   A dilation and curettage (D&C). This involves stretching (dilation) the cervix and scraping (curettage) the inside lining of the uterus to get a tissue sample. The sample is examined under a microscope to look for cancer cells.  Your cancer will be staged to determine its severity and extent. Staging is a careful attempt to find out the size of the tumor, whether the cancer has spread, and if so, to what parts of the body. You may need to have more tests to determine the stage of your cancer. The test results will help determine what treatment plan is best for you. Cancer stages include:   Stage I. The cancer is only found in the uterus.  Stage II. The   cancer has spread to the cervix.  Stage III. The cancer has spread outside the uterus, but not outside the pelvis. The cancer may have spread to the lymph nodes in the pelvis.  Stage IV. The cancer has spread to other parts of the body, such as the bladder or rectum. TREATMENT  Most women with uterine cancer are treated with surgery. This includes removing the uterus, cervix, fallopian tubes, and ovaries (total hysterectomy). Your  lymph nodes near the tumor may also be removed. Some women have radiation, chemotherapy, or hormonal therapy. Other women have a combination of these therapies. HOME CARE INSTRUCTIONS   Take medicines only as directed by your health care provider.   Maintain a healthy diet.  Exercise regularly.   If you have diabetes, high blood pressure, thyroid disease, or gallbladder disease, follow your health care provider's instructions to keep it under control.   Do not smoke.   Consider joining a support group. This may help you learn to cope with the stress of having uterine cancer.   Seek advice to help you manage treatment side effects.   Keep all follow-up visits as directed by your health care provider.  SEEK MEDICAL CARE IF:  You have increased stomach or pelvic pain.  You cannot urinate.  You have abnormal bleeding. Document Released: 11/08/2005 Document Revised: 03/25/2014 Document Reviewed: 04/27/2013 Exit[employer] Patient Information 2015 [employer]. This information is not intended to replace advice given to you by your health care provider. Make sure you discuss any questions you have with your health care provider.  

## 2014-07-25 ENCOUNTER — Ambulatory Visit (INDEPENDENT_AMBULATORY_CARE_PROVIDER_SITE_OTHER): Payer: Medicare Other | Admitting: Internal Medicine

## 2014-07-25 ENCOUNTER — Encounter: Payer: Self-pay | Admitting: Internal Medicine

## 2014-07-25 ENCOUNTER — Other Ambulatory Visit: Payer: Self-pay | Admitting: Internal Medicine

## 2014-07-25 VITALS — BP 133/80 | HR 82 | Temp 97.5°F | Resp 20 | Ht 69.0 in | Wt 306.7 lb

## 2014-07-25 DIAGNOSIS — L910 Hypertrophic scar: Secondary | ICD-10-CM

## 2014-07-25 DIAGNOSIS — M654 Radial styloid tenosynovitis [de Quervain]: Secondary | ICD-10-CM

## 2014-07-25 MED ORDER — NAPROXEN 500 MG PO TABS: 500.0000 mg | ORAL_TABLET | Freq: Two times a day (BID) | ORAL | Status: DC

## 2014-07-25 MED ORDER — WRIST/THUMB SPLINT/LEFT UNIV MISC: 1.0000 | Freq: Every day | Status: DC

## 2014-07-25 NOTE — Progress Notes (Signed)
Patient ID: [name], female   DOB: 1956/06/15, 58 y.o.   MRN: [id number]   Subjective:   Patient ID: [name] female   DOB: 1956-05-30 58 y.o.   MRN: [id number]  HPI: [name] is a 58 y.o. F with PMH of HTN, CAD status post CABG in Feb this year, HLD, probably diagnosis of RA,. Pt presented today with complaints of keloid on her chest and Left wrist pain. Pt had CABG feb this year for CAD, and since then the stenotomy incision and where the staples were placed have developed into a keloid on her anterior chest, which is presently unsightly according to patient, and pt wants it removed. She says she has to hold her low cut blouses up to hide the lesions. Lesions are also very itchy, and uncomfortable, when covered with clothing.  Also pt has pain on the lateral side of her left wrist, no preceeding trauma. Pain has been present for the last- 2 months. Pain not relieved with ibuprofen- $RemoveBeforeD'400mg'[id number]$ , Ultram or Vicodin. She noted that Pain is relieved by placing a kind of bandage on it that she has at home. No previous episodes. No other joint pain. Pt has a hx of possible RA- ESR and CRP were positive, but CCP, RA and xrays all done last year were positive. Pt enodorses that over the past year though she has noticed that the PIP joints of her ringfingers bilat have changed in appearance. No fever, no rash.  Past Medical History  Diagnosis Date  . Seasonal allergies   . Asthma     History of  . Hypertension   . Iron deficiency 12-07-2011  . Anemia   . GERD (gastroesophageal reflux disease)   . Heart murmur   . Osteoporosis   . PONV (postoperative nausea and vomiting)   . Dysrhythmia     occ palpitations   . Sleep apnea     no CPAP machine; sleep study 02/2010 REM AHI 61.7/hr, total sleep REM 14.8/hr  . Headache(784.0)     occasional   . Osteoarthritis   . Morbid obesity   . Acute urinary retention s/p Foley 01/07/2012  . Hemorrhoids, internal, with bleeding & prolapse 12/13/2011    . Myocardial infarction     unsure when; per cardiologist report  . Chronic headaches   . Coronary artery disease   . Anginal pain   . Dyslipidemia   . S/P CABG (coronary artery bypass graft)     x5 - LIMA to LAD, SVG to diagonal, SVG to OM1, SVG to OM2, SVG to PDA (Dr. Prescott Gum)  . Postmenopausal bleeding   . Neuropathy   . Blood dyscrasia     ABNORMAL VAGINAL BLEEDING  . Uterine cancer 7/15    clinical stage IA grade 1 endometrioid endometrial cancer   Current Outpatient Prescriptions  Medication Sig Dispense Refill  . albuterol (PROVENTIL HFA;VENTOLIN HFA) 108 (90 BASE) MCG/ACT inhaler Inhale 2 puffs into the lungs every 6 (six) hours as needed. Wheezing  3.7 g  5  . cycloSPORINE (RESTASIS) 0.05 % ophthalmic emulsion Place 1 drop into both eyes 2 (two) times daily.      . diclofenac sodium (VOLTAREN) 1 % GEL Apply 2 g topically 2 (two) times daily as needed (joint pain).      . Elastic Bandages & Supports (WRIST/THUMB SPLINT/LEFT UNIV) MISC 1 each by Does not apply route daily.  1 each  0  . esomeprazole (NEXIUM) 40 MG capsule Take 40 mg  by mouth daily at 12 noon.      . furosemide (LASIX) 40 MG tablet Take 40 mg by mouth daily.      Marland Kitchen HYDROcodone-acetaminophen (NORCO/VICODIN) 5-325 MG per tablet Take 1-2 tablets by mouth every 6 (six) hours as needed for moderate pain or severe pain.  40 tablet  0  . lisinopril (PRINIVIL,ZESTRIL) 2.5 MG tablet Take 1 tablet (2.5 mg total) by mouth daily.  30 tablet  6  . medroxyPROGESTERone (PROVERA) 10 MG tablet       . metoprolol tartrate (LOPRESSOR) 25 MG tablet Take 25 mg by mouth 2 (two) times daily.      . naproxen (NAPROSYN) 500 MG tablet Take 1 tablet (500 mg total) by mouth 2 (two) times daily with a meal.  30 tablet  0  . potassium chloride (K-DUR) 10 MEQ tablet Take 10 mEq by mouth daily.      . rosuvastatin (CRESTOR) 20 MG tablet Take 1 tablet (20 mg total) by mouth daily.  28 tablet  0  . traMADol (ULTRAM) 50 MG tablet Take 50 mg by  mouth every 6 (six) hours as needed for moderate pain.       No current facility-administered medications for this visit.   Family History  Problem Relation Age of Onset  . Colon cancer Neg Hx   . Hypertension Sister   . Heart failure Sister   . Heart disease Sister   . Diabetes type II Sister   . Kidney disease Brother     hypertension  . Heart attack Mother 62  . Heart disease Mother   . Hypertension Mother   . Diabetes Mother   . Hypertension Child    History   Social History  . Marital Status: Single    Spouse Name: N/A    Number of Children: 2  . Years of Education: N/A   Occupational History  . Retired    Social History Main Topics  . Smoking status: Never Smoker   . Smokeless tobacco: Never Used  . Alcohol Use: No  . Drug Use: No  . Sexual Activity: Not Currently   Other Topics Concern  . None   Social History Narrative  . None   Review of Systems: CONSTITUTIONAL- No Fever, weightloss, night sweat or change in appetite. SKIN- No Rash, colour changes or itching. HEAD- No Headache or dizziness. EYES- No Vision loss, pain, redness, double or blurred vision. RESPIRATORY- No Cough or SOB. CARDIAC- No Palpitations, DOE, PND or chest pain. GI- No nausea, vomiting, diarrhoea, constipation, abd pain. URINARY- No Frequency, urgency, straining or dysuria. NEUROLOGIC- No Numbness, syncope, seizures or burning. [facility]- Denies depression or anxiety.  Objective:  Physical Exam: Filed Vitals:   07/25/14 1022  BP: 133/80  Pulse: 82  Temp: 97.5 F (36.4 C)  TempSrc: Oral  Resp: 20  Height: _0  (1.753 m)  Weight: 306 lb 11.2 oz (139.118 kg)  SpO2: 99%   GENERAL- Pleasant lady, alert, co-operative, appears as stated age, not in any distress. HEENT- Atraumatic, normocephalic, PERRL, EOMI, oral mucosa appears moist, neck supple. CARDIAC- RRR, no murmurs, rubs or gallops. RESP- Moving equal volumes of air, and clear to auscultation bilaterally, no wheezes or  crackles. ABDOMEN- Soft, nontender, bowel sounds present. NEURO- No obvious Cr N abnormality, Gait- Normal. EXTREMITIES- pulse 2+, symmetric, no pedal edema. Upper extremity- left upper extr- minimal swelling radial aspect of left thumb- extending proximally to the wrist, positive finkelstein test, and tenderness present on palpation of the  region on dorsiflexion of the thumb. Mild angulation dorsally of the PIP joints of the ring fingers bilat, appearing like a boutonnieres deformity. SKIN- Warm, dry, No rash or lesion. PSYCH- Normal mood and affect, appropriate thought content and speech.  Assessment & Plan:   The patient's case and plan of care was discussed with attending physician, Dr. Eppie Gibson.  Please see problem based charting for assessment and plan.

## 2014-07-25 NOTE — Patient Instructions (Signed)
We want you to apply a thumb spica splint, its a kind of brace. You can get it at [facility].  Also try taking naproxen- 500mg  BID.   We will refer you to a dermatologist.  Harriet Pho Disease Harriet Pho disease is a condition often seen in racquet sports where there is a soreness (inflammation) in the cord like structures (tendons) which attach muscle to bone on the thumb side of the wrist. There may be a tightening of the tissuesaround the tendons. This condition is often helped by giving up or modifying the activity which caused it. When conservative treatment does not help, surgery may be required. Conservative treatment could include changes in the activity which brought about the problem or made it worse. Anti-inflammatory medications and injections may be used to help decrease the inflammation and help with pain control. Your caregiver will help you determine which is best for you. DIAGNOSIS  Often the diagnosis (learning what is wrong) can be made by examination. Sometimes x-rays are required. HOME CARE INSTRUCTIONS   Apply ice to the sore area for 15-20 minutes, 03-04 times per day while awake. Put the ice in a plastic bag and place a towel between the bag of ice and your skin. This is especially helpful if it can be done after all activities involving the sore wrist.  Temporary splinting may help.  Only take over-the-counter or prescription medicines for pain, discomfort or fever as directed by your caregiver. SEEK MEDICAL CARE IF:   Pain relief is not obtained with medications, or if you have increasing pain and seem to be getting worse rather than better. MAKE SURE YOU:   Understand these instructions.  Will watch your condition.  Will get help right away if you are not doing well or get worse. Document Released: 08/03/2001 Document Revised: 01/31/2012 Document Reviewed: 03/13/2014

## 2014-07-26 DIAGNOSIS — M654 Radial styloid tenosynovitis [de Quervain]: Secondary | ICD-10-CM | POA: Insufficient documentation

## 2014-07-26 DIAGNOSIS — L910 Hypertrophic scar: Secondary | ICD-10-CM | POA: Insufficient documentation

## 2014-07-26 NOTE — Assessment & Plan Note (Signed)
Pain left lat wrist- Hx and exam, suggestive of De quervain's tenosynovitis.  Plan- Wrist splint for now. - Will try Naproxen- 500mg  BID. - if no response in a month, pt might need steroid injections/referral to rheumatology to further evaluate RH- diagnosis, and possibly related sinovitis.

## 2014-07-26 NOTE — Progress Notes (Signed)
Case discussed with Dr. Emokpae at time of visit.  We reviewed the resident's history and exam and pertinent patient test results.  I agree with the assessment, diagnosis, and plan of care documented in the resident's note. 

## 2014-07-26 NOTE — Assessment & Plan Note (Signed)
From median stenotomy scar- feb this year. Pt might benefit from both excision and intralesional steroid injection.  Plan- Refferal to dermatology, especially as regards cosmetics.

## 2014-08-14 ENCOUNTER — Other Ambulatory Visit: Payer: Self-pay | Admitting: Cardiology

## 2014-08-14 NOTE — Telephone Encounter (Signed)
Rx was sent to pharmacy electronically. 

## 2014-09-10 ENCOUNTER — Encounter: Payer: Self-pay | Admitting: Internal Medicine

## 2014-09-12 ENCOUNTER — Other Ambulatory Visit: Payer: Self-pay | Admitting: Cardiology

## 2014-09-12 ENCOUNTER — Other Ambulatory Visit: Payer: Self-pay | Admitting: Internal Medicine

## 2014-09-12 NOTE — Telephone Encounter (Signed)
Rx was sent to pharmacy electronically. 

## 2014-09-23 ENCOUNTER — Encounter: Payer: Self-pay | Admitting: Internal Medicine

## 2014-09-26 ENCOUNTER — Other Ambulatory Visit: Payer: Self-pay | Admitting: Internal Medicine

## 2014-09-28 ENCOUNTER — Encounter (HOSPITAL_COMMUNITY): Payer: Self-pay | Admitting: Nurse Practitioner

## 2014-09-28 ENCOUNTER — Emergency Department (HOSPITAL_COMMUNITY): Payer: Medicare Other

## 2014-09-28 ENCOUNTER — Inpatient Hospital Stay (HOSPITAL_COMMUNITY)
Admission: EM | Admit: 2014-09-28 | Discharge: 2014-09-29 | DRG: 092 | Disposition: A | Discharge status: Home / Self Care | Payer: Medicare Other | Attending: Oncology | Admitting: Oncology

## 2014-09-28 DIAGNOSIS — I10 Essential (primary) hypertension: Secondary | ICD-10-CM | POA: Diagnosis present

## 2014-09-28 DIAGNOSIS — G4733 Obstructive sleep apnea (adult) (pediatric): Secondary | ICD-10-CM | POA: Diagnosis present

## 2014-09-28 DIAGNOSIS — Z6841 Body Mass Index (BMI) 40.0 and over, adult: Secondary | ICD-10-CM | POA: Diagnosis not present

## 2014-09-28 DIAGNOSIS — Z8542 Personal history of malignant neoplasm of other parts of uterus: Secondary | ICD-10-CM

## 2014-09-28 DIAGNOSIS — Z90710 Acquired absence of both cervix and uterus: Secondary | ICD-10-CM

## 2014-09-28 DIAGNOSIS — I25119 Atherosclerotic heart disease of native coronary artery with unspecified angina pectoris: Secondary | ICD-10-CM | POA: Diagnosis present

## 2014-09-28 DIAGNOSIS — Z7982 Long term (current) use of aspirin: Secondary | ICD-10-CM | POA: Diagnosis not present

## 2014-09-28 DIAGNOSIS — I252 Old myocardial infarction: Secondary | ICD-10-CM | POA: Diagnosis not present

## 2014-09-28 DIAGNOSIS — Z951 Presence of aortocoronary bypass graft: Secondary | ICD-10-CM | POA: Diagnosis not present

## 2014-09-28 DIAGNOSIS — R200 Anesthesia of skin: Principal | ICD-10-CM | POA: Diagnosis present

## 2014-09-28 DIAGNOSIS — G8929 Other chronic pain: Secondary | ICD-10-CM | POA: Diagnosis present

## 2014-09-28 DIAGNOSIS — K219 Gastro-esophageal reflux disease without esophagitis: Secondary | ICD-10-CM | POA: Diagnosis present

## 2014-09-28 DIAGNOSIS — E785 Hyperlipidemia, unspecified: Secondary | ICD-10-CM | POA: Diagnosis present

## 2014-09-28 DIAGNOSIS — Z96653 Presence of artificial knee joint, bilateral: Secondary | ICD-10-CM | POA: Diagnosis present

## 2014-09-28 DIAGNOSIS — Z881 Allergy status to other antibiotic agents status: Secondary | ICD-10-CM

## 2014-09-28 DIAGNOSIS — R209 Unspecified disturbances of skin sensation: Secondary | ICD-10-CM

## 2014-09-28 DIAGNOSIS — M79602 Pain in left arm: Secondary | ICD-10-CM | POA: Diagnosis present

## 2014-09-28 DIAGNOSIS — J45909 Unspecified asthma, uncomplicated: Secondary | ICD-10-CM | POA: Diagnosis present

## 2014-09-28 DIAGNOSIS — N950 Postmenopausal bleeding: Secondary | ICD-10-CM | POA: Diagnosis present

## 2014-09-28 DIAGNOSIS — R202 Paresthesia of skin: Secondary | ICD-10-CM

## 2014-09-28 DIAGNOSIS — Z880 Allergy status to penicillin: Secondary | ICD-10-CM | POA: Diagnosis not present

## 2014-09-28 DIAGNOSIS — Z91040 Latex allergy status: Secondary | ICD-10-CM | POA: Diagnosis not present

## 2014-09-28 DIAGNOSIS — M810 Age-related osteoporosis without current pathological fracture: Secondary | ICD-10-CM | POA: Diagnosis present

## 2014-09-28 DIAGNOSIS — G629 Polyneuropathy, unspecified: Secondary | ICD-10-CM | POA: Diagnosis present

## 2014-09-28 DIAGNOSIS — IMO0001 Reserved for inherently not codable concepts without codable children: Secondary | ICD-10-CM | POA: Diagnosis present

## 2014-09-28 DIAGNOSIS — M1990 Unspecified osteoarthritis, unspecified site: Secondary | ICD-10-CM | POA: Diagnosis present

## 2014-09-28 DIAGNOSIS — Z885 Allergy status to narcotic agent status: Secondary | ICD-10-CM | POA: Diagnosis not present

## 2014-09-28 LAB — CBC
HEMATOCRIT: 33.5 % — AB (ref 36.0–46.0)
Hemoglobin: 10.9 g/dL — ABNORMAL LOW (ref 12.0–15.0)
MCH: 24 pg — AB (ref 26.0–34.0)
MCHC: 32.5 g/dL (ref 30.0–36.0)
MCV: 73.8 fL — ABNORMAL LOW (ref 78.0–100.0)
Platelets: 283 10*3/uL (ref 150–400)
RBC: 4.54 MIL/uL (ref 3.87–5.11)
RDW: 18.7 % — ABNORMAL HIGH (ref 11.5–15.5)
WBC: 7.3 10*3/uL (ref 4.0–10.5)

## 2014-09-28 LAB — BASIC METABOLIC PANEL
ANION GAP: 15 (ref 5–15)
BUN: 12 mg/dL (ref 6–23)
CO2: 24 mEq/L (ref 19–32)
CREATININE: 0.69 mg/dL (ref 0.50–1.10)
Calcium: 9.7 mg/dL (ref 8.4–10.5)
Chloride: 101 mEq/L (ref 96–112)
GFR calc Af Amer: >90 mL/min (ref >90)
GFR calc non Af Amer: >90 mL/min (ref >90)
Glucose, Bld: 126 mg/dL — ABNORMAL HIGH (ref 70–99)
Potassium: 4.4 mEq/L (ref 3.7–5.3)
Sodium: 140 mEq/L (ref 137–147)

## 2014-09-28 LAB — URINE MICROSCOPIC-ADD ON

## 2014-09-28 LAB — URINALYSIS, ROUTINE W REFLEX MICROSCOPIC
Bilirubin Urine: NEGATIVE
Glucose, UA: NEGATIVE mg/dL
KETONES UR: NEGATIVE mg/dL
NITRITE: NEGATIVE
PH: 5 (ref 5.0–8.0)
PROTEIN: NEGATIVE mg/dL
Specific Gravity, Urine: 1.02 (ref 1.005–1.030)
Urobilinogen, UA: 0.2 mg/dL (ref 0.0–1.0)

## 2014-09-28 LAB — I-STAT TROPONIN, ED: Troponin i, poc: 0 ng/mL (ref 0.00–0.08)

## 2014-09-28 LAB — TROPONIN I

## 2014-09-28 LAB — TSH: TSH: 1.95 u[IU]/mL (ref 0.350–4.500)

## 2014-09-28 LAB — PRO B NATRIURETIC PEPTIDE: PRO B NATRI PEPTIDE: 171.1 pg/mL — AB (ref 0–125)

## 2014-09-28 MED ORDER — HYDRALAZINE HCL 20 MG/ML IJ SOLN: 5.0000 mg | Freq: Once | INTRAMUSCULAR | Status: DC

## 2014-09-28 MED ORDER — PANTOPRAZOLE SODIUM 40 MG PO TBEC
40.0000 mg | DELAYED_RELEASE_TABLET | Freq: Every day | ORAL | Status: DC
  Administered 2014-09-29: 40 mg via ORAL
  Filled 2014-09-28: qty 1

## 2014-09-28 MED ORDER — ENOXAPARIN SODIUM 40 MG/0.4ML ~~LOC~~ SOLN
40.0000 mg | SUBCUTANEOUS | Status: DC
  Administered 2014-09-28: 40 mg via SUBCUTANEOUS
  Filled 2014-09-28: qty 0.4

## 2014-09-28 MED ORDER — ASPIRIN EC 81 MG PO TBEC
81.0000 mg | DELAYED_RELEASE_TABLET | Freq: Every day | ORAL | Status: DC
  Administered 2014-09-29: 81 mg via ORAL
  Filled 2014-09-28: qty 1

## 2014-09-28 MED ORDER — METOPROLOL TARTRATE 25 MG PO TABS
25.0000 mg | ORAL_TABLET | Freq: Two times a day (BID) | ORAL | Status: DC
  Administered 2014-09-28: 25 mg via ORAL
  Filled 2014-09-28: qty 1

## 2014-09-28 MED ORDER — FLUTICASONE PROPIONATE 50 MCG/ACT NA SUSP
1.0000 | Freq: Every day | NASAL | Status: DC | PRN
  Filled 2014-09-28: qty 16

## 2014-09-28 MED ORDER — LISINOPRIL 2.5 MG PO TABS: 2.5000 mg | ORAL_TABLET | Freq: Every day | ORAL | Status: DC

## 2014-09-28 MED ORDER — ROSUVASTATIN CALCIUM 10 MG PO TABS: 20.0000 mg | ORAL_TABLET | Freq: Every day | ORAL | Status: DC

## 2014-09-28 MED ORDER — ALBUTEROL SULFATE HFA 108 (90 BASE) MCG/ACT IN AERS: 2.0000 | INHALATION_SPRAY | Freq: Four times a day (QID) | RESPIRATORY_TRACT | Status: DC | PRN

## 2014-09-28 MED ORDER — HYDRALAZINE HCL 20 MG/ML IJ SOLN: 5.0000 mg | INTRAMUSCULAR | Status: DC | PRN

## 2014-09-28 MED ORDER — FUROSEMIDE 40 MG PO TABS
40.0000 mg | ORAL_TABLET | Freq: Every day | ORAL | Status: DC
  Administered 2014-09-29: 40 mg via ORAL
  Filled 2014-09-28: qty 1

## 2014-09-28 MED ORDER — CYCLOSPORINE 0.05 % OP EMUL
1.0000 [drp] | Freq: Two times a day (BID) | OPHTHALMIC | Status: DC
  Administered 2014-09-28: 1 [drp] via OPHTHALMIC
  Filled 2014-09-28 (×4): qty 1

## 2014-09-28 MED ORDER — ASPIRIN 81 MG PO CHEW
324.0000 mg | CHEWABLE_TABLET | Freq: Once | ORAL | Status: AC
Start: 2014-09-28 — End: 2014-09-28
  Administered 2014-09-28: 324 mg via ORAL
  Filled 2014-09-28: qty 4

## 2014-09-28 MED ORDER — ALBUTEROL SULFATE (2.5 MG/3ML) 0.083% IN NEBU: 2.5000 mg | INHALATION_SOLUTION | Freq: Four times a day (QID) | RESPIRATORY_TRACT | Status: DC | PRN

## 2014-09-28 MED ORDER — POTASSIUM CHLORIDE ER 10 MEQ PO TBCR
10.0000 meq | EXTENDED_RELEASE_TABLET | Freq: Every day | ORAL | Status: DC
  Administered 2014-09-29: 10 meq via ORAL
  Filled 2014-09-28 (×2): qty 1

## 2014-09-28 NOTE — Plan of Care (Signed)
Problem: Acute Treatment Outcomes Goal: Neuro exam at baseline or improved Outcome: Not Applicable Date Met:  79/72/82 Goal: Airway maintained/protected Outcome: Completed/Met Date Met:  09/28/14 Goal: 02 Sats > 94% Outcome: Completed/Met Date Met:  09/28/14 Goal: Hemodynamically stable Outcome: Completed/Met Date Met:  09/28/14 Goal: tPA Patient w/o S&S of bleeding Outcome: Not Applicable Date Met:  04/23/55

## 2014-09-28 NOTE — ED Notes (Signed)
Attempted report x1. 

## 2014-09-28 NOTE — ED Notes (Signed)
Pt denies chest pain but reports left sided numbness.

## 2014-09-28 NOTE — H&P (Signed)
Date: 09/28/2014               Patient Name:  [name] MRN: [id number]  DOB: 1956-10-20 Age / Sex: 58 y.o., female   PCP: Madilyn Fireman, MD         Medical Service: Internal Medicine Teaching Service         Attending Physician: Dr. Annia Belt, MD    First Contact: Dr. Charlott Rakes Pager: [phone]  Second Contact: Dr. Bing Neighbors Pager: [phone]       After Hours (After 5p/  First Contact Pager: [phone]  weekends / holidays): Second Contact Pager: [phone]   Chief Complaint: L-sided numbness  History of Present Illness: [name] is a 58 year old female with HTN, CAD s/p CABG (Feb 2015), HLD who presents with a 3-day history of left-sided numbness.  She does not recall an inciting factor but reports that her left leg has been numb for the past three years ever since she had surgery. Over the last three days, the numbness now has gone up to her knees as of today. Today, she noted numbness in the left face, left arm similar to when she had it on the right side of her body which warranted a CABG back in February, so she came to the ED.   At night, she reports burning and shooting pain that goes up to her knee but only on her left side. She also has chronic back pain and became tearful when explaining she had a fracture. She reports headaches (since the last week), blurry vision, dizziness, chest pain (located in surgery incision site), shortness of breath (just today), abdominal pain, nausea but denies dysarthria, dysphagia, dysuria.  Meds: No current facility-administered medications for this encounter.   Current Outpatient Prescriptions  Medication Sig Dispense Refill  . albuterol (PROVENTIL HFA;VENTOLIN HFA) 108 (90 BASE) MCG/ACT inhaler Inhale 2 puffs into the lungs every 6 (six) hours as needed. Wheezing 3.7 g 5  . aspirin EC 81 MG tablet Take 81 mg by mouth daily.    . [employer] SENNA PLUS 8.6-50 MG per tablet Take 1 tablet by mouth at bedtime as needed  (constipation).   5  . cycloSPORINE (RESTASIS) 0.05 % ophthalmic emulsion Place 1 drop into both eyes 2 (two) times daily.    . diclofenac sodium (VOLTAREN) 1 % GEL Apply 2 g topically 2 (two) times daily as needed (joint pain).    . Fluocinolone Acetonide 0.01 % OIL Place 1 drop into both ears daily as needed (itchy ears).    . fluticasone (FLONASE) 50 MCG/ACT nasal spray Place 1 spray into both nostrils daily as needed for allergies or rhinitis.    . furosemide (LASIX) 40 MG tablet Take 40 mg by mouth daily.    Marland Kitchen lisinopril (PRINIVIL,ZESTRIL) 2.5 MG tablet TAKE 1 TABLET (2.5 MG TOTAL) BY MOUTH DAILY. 30 tablet 8  . metoprolol tartrate (LOPRESSOR) 25 MG tablet Take 25 mg by mouth 2 (two) times daily.    . naproxen (NAPROSYN) 500 MG tablet Take 1 tablet (500 mg total) by mouth 2 (two) times daily with a meal. 30 tablet 0  . NEXIUM 40 MG capsule TAKE ONE CAPSULE BY MOUTH EVERY DAY 30 capsule 1  . potassium chloride (K-DUR) 10 MEQ tablet Take 10 mEq by mouth daily.    . rosuvastatin (CRESTOR) 20 MG tablet Take 1 tablet (20 mg total) by mouth daily. 28 tablet 0    Allergies: Allergies as of 09/28/2014 - Review  Complete 09/28/2014  Allergen Reaction Noted  . Cephalosporins Anaphylaxis 06/17/2012  . Codeine Other (See Comments) 09/18/2009  . Latex Itching   . Ciprofloxacin  09/28/2014  . Penicillins Other (See Comments) 06/30/2007  . Prednisone Other (See Comments) 06/11/2012   Past Medical History  Diagnosis Date  . Seasonal allergies   . Asthma     History of  . Hypertension   . Iron deficiency 12-07-2011  . Anemia   . GERD (gastroesophageal reflux disease)   . Heart murmur   . Osteoporosis   . PONV (postoperative nausea and vomiting)   . Dysrhythmia     occ palpitations   . Sleep apnea     no CPAP machine; sleep study 02/2010 REM AHI 61.7/hr, total sleep REM 14.8/hr  . Headache(784.0)     occasional   . Osteoarthritis   . Morbid obesity   . Acute urinary retention s/p Foley  01/07/2012  . Hemorrhoids, internal, with bleeding & prolapse 12/13/2011  . Myocardial infarction     unsure when; per cardiologist report  . Chronic headaches   . Coronary artery disease   . Anginal pain   . Dyslipidemia   . S/P CABG (coronary artery bypass graft)     x5 - LIMA to LAD, SVG to diagonal, SVG to OM1, SVG to OM2, SVG to PDA (Dr. Prescott Gum)  . Postmenopausal bleeding   . Neuropathy   . Blood dyscrasia     ABNORMAL VAGINAL BLEEDING  . Uterine cancer 7/15    clinical stage IA grade 1 endometrioid endometrial cancer   Past Surgical History  Procedure Laterality Date  . Knee surgery Bilateral 1999  . Total knee arthroplasty Left 04/29/2011  . Multiple tooth extractions    . Wisdom tooth extraction    . Colonoscopy  2009  . Knee arthroscopy  1991  . Transanal hemorrhoidal dearterliaization  01/06/12    with external hemorrhoid removal  . Coronary artery bypass graft N/A 01/09/2014    Procedure: CORONARY ARTERY BYPASS GRAFTING (CABG) x 5 using left internal mammary artery and right leg greater saphenous vein harvested endoscopically;  Surgeon: Ivin Poot, MD;  Location: [place];  Service: Open Heart Surgery;  Laterality: N/A;  please use bed extenders and breast binder  . Intraoperative transesophageal echocardiogram N/A 01/09/2014    Procedure: INTRAOPERATIVE TRANSESOPHAGEAL ECHOCARDIOGRAM;  Surgeon: Ivin Poot, MD;  Location: [place];  Service: Open Heart Surgery;  Laterality: N/A;  . Tee without cardioversion  01/2010    EF 60-65%, small, flat, non-infiltrating, calcified, fixed apical/septal mass  . Nm myocar perf wall motion  01/2010    dipyridamole myoview - moderate perfusion defect in basal inferoseptal, basal inferior, mid inferoseptal, mid inferior, apical inferior region; EF 56%  . Cardiac catheterization    . Multiple extractions with alveoloplasty N/A 04/11/2014    Procedure: Extraction of tooth #'s 1,2,8,16 with alveoloplasty, maxillary tuberosity reductions, and  gross debridement of remaining teeth.;  Surgeon: Lenn Cal, DDS;  Location: [place];  Service: Oral Surgery;  Laterality: N/A;  . Vaginal hysterectomy N/A 06/03/2014    Procedure: HYSTERECTOMY VAGINAL;  Surgeon: Lavonia Drafts, MD;  Location: [facility];  Service: Gynecology;  Laterality: N/A;  . Unilateral salpingectomy Right 06/03/2014    Procedure: UNILATERAL SALPINGECTOMY;  Surgeon: Lavonia Drafts, MD;  Location: [facility];  Service: Gynecology;  Laterality: Right;   Family History  Problem Relation Age of Onset  . Colon cancer Neg Hx   . Hypertension Sister   .  Heart failure Sister   . Heart disease Sister   . Diabetes type II Sister   . Kidney disease Brother     hypertension  . Heart attack Mother 33  . Heart disease Mother   . Hypertension Mother   . Diabetes Mother   . Hypertension Child    History   Social History  . Marital Status: Single    Spouse Name: N/A    Number of Children: 2  . Years of Education: N/A   Occupational History  . Retired    Social History Main Topics  . Smoking status: Never Smoker   . Smokeless tobacco: Never Used  . Alcohol Use: No  . Drug Use: No  . Sexual Activity: Not Currently   Other Topics Concern  . Not on file   Social History Narrative    Review of Systems: Review of Systems  Constitutional: Negative for fever, chills and diaphoresis.  Eyes: Positive for blurred vision.  Respiratory: Positive for shortness of breath.   Cardiovascular: Positive for chest pain.  Gastrointestinal: Positive for nausea and abdominal pain.  Musculoskeletal: Positive for back pain.  Neurological: Positive for dizziness, tingling and headaches. Negative for weakness.     Physical Exam: Blood pressure 126/81, pulse 61, temperature 97.8 F (36.6 C), temperature source Oral, resp. rate 20, last menstrual period 01/13/2014, SpO2 100 %.  General: resting in bed, NAD HEENT: PERRL, EOMI, no scleral icterus, oropharynx clear, moist  mucous membranes Chest: scar present along sternum with overlying keloid Cardiac: RRR, no rubs, murmurs or gallops though limited by body habitis Pulm: clear to auscultation bilaterally, no wheezes, rales, or rhonchi though sounds limited by body habitus Abd: soft, nontender, nondistended, BS present Ext: warm and well perfused, no pedal edema, RLE > LLE (chronic per self-report) without warmth or edema Neuro: alert and oriented X3, cranial nerves II-XII intact, 5/5 strength UE/LE bilaterally though initially weaker on L side (4/5) but improved with repeat testing, strength and sensation to light touch equal in bilateral upper and lower extremities without any noted deficits when compared, rapid alternating movements intact bilaterally, brachial and patellar reflexes 2+ bilaterally   Lab results: Basic Metabolic Panel:  Recent Labs  09/28/14 1247  NA 140  K 4.4  CL 101  CO2 24  GLUCOSE 126*  BUN 12  CREATININE 0.69  CALCIUM 9.7   CBC:  Recent Labs  09/28/14 1247  WBC 7.3  HGB 10.9*  HCT 33.5*  MCV 73.8*  PLT 283   BNP:  Recent Labs  09/28/14 1247  PROBNP 171.1*   Urinalysis:  Recent Labs  09/28/14 1445  COLORURINE YELLOW  LABSPEC 1.020  PHURINE 5.0  GLUCOSEU NEGATIVE  HGBUR SMALL*  BILIRUBINUR NEGATIVE  KETONESUR NEGATIVE  PROTEINUR NEGATIVE  UROBILINOGEN 0.2  NITRITE NEGATIVE  LEUKOCYTESUR SMALL*   Imaging results:  Ct Head Wo Contrast  09/28/2014   CLINICAL DATA:  New onset left hemisphere weakness- Pt reports pain and numbness if left wrist x 3 mos, but onset today entire left hemisphere (Left upper and lower extremity, as well as left facial numbness Pt had CAD with recent CABG and HTN  EXAM: CT HEAD WITHOUT CONTRAST  TECHNIQUE: Contiguous axial images were obtained from the base of the skull through the vertex without intravenous contrast.  COMPARISON:  02/01/2013.  FINDINGS: Patchy white matter low density in both cerebral hemispheres. Normal size  and position of the ventricles. No intracranial hemorrhage, mass lesion or CT evidence of acute infarction. Unremarkable bones  and included paranasal sinuses.  IMPRESSION: No acute abnormality. Minimal chronic small vessel white matter ischemic changes in both cerebral hemispheres.   Electronically Signed   By: Enrique Sack M.D.   On: 09/28/2014 14:12   Dg Chest Port 1 View  09/28/2014   CLINICAL DATA:  Left arm pain, bronchitis  EXAM: PORTABLE CHEST - 1 VIEW  COMPARISON:  03/27/2014  FINDINGS: Increased interstitial markings/bronchitic changes. No focal consolidation. No pleural effusion or pneumothorax.  Cardiomegaly.  Postsurgical changes related to prior CABG.  IMPRESSION: Increased interstitial markings/bronchitic changes.   Electronically Signed   By: Julian Hy M.D.   On: 09/28/2014 14:29    Other results: EKG: Reviewed and compared with 03/25/14: Little electrical activity due to body habitus Normal sinus rate and rhythm Normal axis   Assessment & Plan by Problem: Ms. Linthicum is a 58 year old female with HTN, CAD s/p CABG (Feb 2015), HLD who presents with a 3-day history of left-sided numbness.  L-sided numbness: Given her risk factors, TIA/stroke is suspect especially with the symptoms she reported though head CT reassuring for no bleed as well as neurologic exam findings. ACS possible given history of CAD and similar presentation though POC troponin unremarkable in the ED. TIMI score 2 stratifies her at low risk. -Monitor on telemetry -Check brain MRI -Repeat EKG  HTN: Hold meds to allow for permissive HTN  CAD: Continue ASA  HLD: Continue Crestor  #FEN:  -Diet: Heart Healthy/Carb Modified  #DVT prophylaxis: Lovenox  #CODE STATUS: FULL CODE  Dispo: Disposition is deferred at this time, awaiting improvement of current medical problems. Anticipated discharge in approximately 1-2 day(s).   The patient does have a current PCP (Madilyn Fireman, MD) and does need an [facility]  hospital follow-up appointment after discharge.  The patient does not know have transportation limitations that hinder transportation to clinic appointments.  Signed: Charlott Rakes, MD 09/28/2014, 6:26 PM

## 2014-09-28 NOTE — ED Notes (Signed)
Pt reports she's had L arm pain and tingling for the past 2 days. She states "it feels like when i had my heart attack in February." she decided to come in today because she felt started to feel SOB

## 2014-09-28 NOTE — ED Provider Notes (Signed)
TIME SEEN: 12:40 PM  CHIEF COMPLAINT: left-sided numbness, left arm pain  HPI: Pt is a 58 y.o. F history of hypertension, coronary artery disease status post CABG in February 2015, dyslipidemia who presents to the emergency department with complaints of 3 days of left-sided numbness and has progressively gotten worse. She reports that she is always had some numbness in her left foot after a knee replacement but states that she noticed increasing numbness in her left lower extremity 3 days ago. She then states that she knows in her arm and is now all the way up her arm and also in her face. No associated weakness. She states that today she also had pain in her left upper extremity that is not worse with palpation or movement. She states that the pain feels similar to her prior cardiac pain but she is not having chest pain or shortness of breath as she did in February 2015 with her prior CABG. No diaphoresis or dizziness. No fevers or cough. No head injury. No headache.  PCP is [facility]  ROS: See HPI Constitutional: no fever  Eyes: no drainage  ENT: no runny nose   Cardiovascular:  no chest pain  Resp: no SOB  GI: no vomiting GU: no dysuria Integumentary: no rash  Allergy: no hives  Musculoskeletal: no leg swelling  Neurological: no slurred speech ROS otherwise negative  PAST MEDICAL HISTORY/PAST SURGICAL HISTORY:  Past Medical History  Diagnosis Date  . Seasonal allergies   . Asthma     History of  . Hypertension   . Iron deficiency 12-07-2011  . Anemia   . GERD (gastroesophageal reflux disease)   . Heart murmur   . Osteoporosis   . PONV (postoperative nausea and vomiting)   . Dysrhythmia     occ palpitations   . Sleep apnea     no CPAP machine; sleep study 02/2010 REM AHI 61.7/hr, total sleep REM 14.8/hr  . Headache(784.0)     occasional   . Osteoarthritis   . Morbid obesity   . Acute urinary retention s/p Foley 01/07/2012  . Hemorrhoids, internal, with bleeding  & prolapse 12/13/2011  . Myocardial infarction     unsure when; per cardiologist report  . Chronic headaches   . Coronary artery disease   . Anginal pain   . Dyslipidemia   . S/P CABG (coronary artery bypass graft)     x5 - LIMA to LAD, SVG to diagonal, SVG to OM1, SVG to OM2, SVG to PDA (Dr. Prescott Gum)  . Postmenopausal bleeding   . Neuropathy   . Blood dyscrasia     ABNORMAL VAGINAL BLEEDING  . Uterine cancer 7/15    clinical stage IA grade 1 endometrioid endometrial cancer    MEDICATIONS:  Prior to Admission medications   Medication Sig Start Date End Date Taking? Authorizing Provider  albuterol (PROVENTIL HFA;VENTOLIN HFA) 108 (90 BASE) MCG/ACT inhaler Inhale 2 puffs into the lungs every 6 (six) hours as needed. Wheezing 09/25/13   Madilyn Fireman, MD  cycloSPORINE (RESTASIS) 0.05 % ophthalmic emulsion Place 1 drop into both eyes 2 (two) times daily.    Historical Provider, MD  diclofenac sodium (VOLTAREN) 1 % GEL Apply 2 g topically 2 (two) times daily as needed (joint pain).    Historical Provider, MD  Elastic Bandages & Supports (WRIST/THUMB SPLINT/LEFT UNIV) MISC 1 each by Does not apply route daily. 07/25/14   Ejiroghene Arlyce Dice, MD  furosemide (LASIX) 40 MG tablet Take 40 mg by  mouth daily. 02/01/14   Isaiah Serge, NP  HYDROcodone-acetaminophen (NORCO/VICODIN) 5-325 MG per tablet Take 1-2 tablets by mouth every 6 (six) hours as needed for moderate pain or severe pain. 04/11/14   Lenn Cal, DDS  lisinopril (PRINIVIL,ZESTRIL) 2.5 MG tablet TAKE 1 TABLET (2.5 MG TOTAL) BY MOUTH DAILY. 09/12/14   [name], MD  medroxyPROGESTERone (PROVERA) 10 MG tablet  03/29/14   Historical Provider, MD  metoprolol tartrate (LOPRESSOR) 25 MG tablet Take 25 mg by mouth 2 (two) times daily.    Historical Provider, MD  metoprolol tartrate (LOPRESSOR) 25 MG tablet TAKE 1 TABLET (25 MG TOTAL) BY MOUTH 2 (TWO) TIMES DAILY. 08/14/14   [name], MD  naproxen (NAPROSYN) 500 MG tablet  Take 1 tablet (500 mg total) by mouth 2 (two) times daily with a meal. 07/25/14   Ejiroghene E Emokpae, MD  NEXIUM 40 MG capsule TAKE ONE CAPSULE BY MOUTH EVERY DAY 09/27/14   Madilyn Fireman, MD  potassium chloride (K-DUR) 10 MEQ tablet Take 10 mEq by mouth daily.    Historical Provider, MD  rosuvastatin (CRESTOR) 20 MG tablet Take 1 tablet (20 mg total) by mouth daily. 02/27/14   [name], MD  traMADol (ULTRAM) 50 MG tablet Take 50 mg by mouth every 6 (six) hours as needed for moderate pain.    Historical Provider, MD    ALLERGIES:  Allergies  Allergen Reactions  . Cephalosporins Anaphylaxis    Tongue swelling, gum pain  . Codeine Other (See Comments)    hallucinations  . Latex Itching  . Ciprofloxacin   . Penicillins Other (See Comments)    Childhood allergy but pt thinks she is no longer allergic  . Prednisone Other (See Comments)    Heart beating fast       SOCIAL HISTORY:  History  Substance Use Topics  . Smoking status: Never Smoker   . Smokeless tobacco: Never Used  . Alcohol Use: No    FAMILY HISTORY: Family History  Problem Relation Age of Onset  . Colon cancer Neg Hx   . Hypertension Sister   . Heart failure Sister   . Heart disease Sister   . Diabetes type II Sister   . Kidney disease Brother     hypertension  . Heart attack Mother 11  . Heart disease Mother   . Hypertension Mother   . Diabetes Mother   . Hypertension Child     EXAM: BP 155/86 mmHg  Pulse 79  Temp(Src) 97.8 F (36.6 C) (Oral)  Resp 15  SpO2 100%  LMP 01/13/2014 CONSTITUTIONAL: Alert and oriented and responds appropriately to questions. Well-appearing; well-nourished HEAD: Normocephalic EYES: Conjunctivae clear, PERRL ENT: normal nose; no rhinorrhea; moist mucous membranes; pharynx without lesions noted NECK: Supple, no meningismus, no LAD  CARD: RRR; S1 and S2 appreciated; no murmurs, no clicks, no rubs, no gallops RESP: Normal chest excursion without splinting or  tachypnea; breath sounds clear and equal bilaterally; no wheezes, no rhonchi, no rales,  ABD/GI: Normal bowel sounds; non-distended; soft, non-tender, no rebound, no guarding BACK:  The back appears normal and is non-tender to palpation, there is no CVA tenderness EXT: Normal ROM in all joints; non-tender to palpation; no edema; normal capillary refill; no cyanosis    SKIN: Normal color for age and race; warm NEURO: Moves all extremities equally; decreased sensation in left face and left arm and left leg compared to right, cranial nerves II through XII intact; NIHSS would be 2  PSYCH: The patient's mood and manner are appropriate. Grooming and personal hygiene are appropriate.  MEDICAL DECISION MAKING: Pt here with symptoms of left-sided numbness and left arm pain. Concern for possible stroke versus anginal equivalent. Will obtain cardiac labs, head CT. Given her multiple risk factors, anticipate admitting patient to the hospital for further evaluation. EKG shows no new ischemic changes. She is hemodynamically stable. Given symptoms have been present for 3 days, she is outside of any TPA window.  ED PROGRESS: Patient's labs are unremarkable. Troponin negative. Chest x-ray does show increased interstitial markings, bronchitic changes but she denies any cough or shortness of breath. Head CT shows no acute abnormalities. She is still complaining of left-sided numbness. Will need admission, MRI. We'll discuss with internal medicine.    3:40 PM  Discussed with Dr. Beryle Beams attending physician with internal medicine who will see the patient in the emergency department for evaluation.  Unable to get in contact with internal medicine resident after multiple attempts.      EKG Interpretation  Date/Time:  Saturday September 28 2014 12:23:43 EST Ventricular Rate:  71 PR Interval:  172 QRS Duration: 84 QT Interval:  424 QTC Calculation: 460 R Axis:   46 Text Interpretation:  Normal sinus rhythm Cannot  rule out Anterior infarct , age undetermined Abnormal ECG Confirmed by Quantavia Frith,  DO, Briannah Lona [phone]) on 09/28/2014 12:40:06 PM         [place], DO 09/28/14 1543

## 2014-09-28 NOTE — ED Notes (Signed)
Admitting MDs at bedside.

## 2014-09-28 NOTE — Plan of Care (Signed)
Problem: Consults Goal: Skin Care Protocol Initiated - if Braden Score 18 or less If consults are not indicated, leave blank or document N/A Outcome: Not Applicable Date Met:  83/16/74 Goal: Nutrition Consult-if indicated Outcome: Not Applicable Date Met:  25/52/58 Goal: Diabetes Guidelines if Diabetic/Glucose > 140 If diabetic or lab glucose is > 140 mg/dl - Initiate Diabetes/Hyperglycemia Guidelines & Document Interventions  Outcome: Not Applicable Date Met:  94/83/47

## 2014-09-29 ENCOUNTER — Encounter (HOSPITAL_COMMUNITY): Payer: Self-pay | Admitting: *Deleted

## 2014-09-29 ENCOUNTER — Inpatient Hospital Stay (HOSPITAL_COMMUNITY): Payer: Medicare Other

## 2014-09-29 DIAGNOSIS — E785 Hyperlipidemia, unspecified: Secondary | ICD-10-CM

## 2014-09-29 DIAGNOSIS — Z951 Presence of aortocoronary bypass graft: Secondary | ICD-10-CM

## 2014-09-29 DIAGNOSIS — IMO0001 Reserved for inherently not codable concepts without codable children: Secondary | ICD-10-CM | POA: Diagnosis present

## 2014-09-29 DIAGNOSIS — R202 Paresthesia of skin: Secondary | ICD-10-CM

## 2014-09-29 DIAGNOSIS — R209 Unspecified disturbances of skin sensation: Secondary | ICD-10-CM

## 2014-09-29 DIAGNOSIS — I10 Essential (primary) hypertension: Secondary | ICD-10-CM

## 2014-09-29 DIAGNOSIS — I2510 Atherosclerotic heart disease of native coronary artery without angina pectoris: Secondary | ICD-10-CM

## 2014-09-29 LAB — RETICULOCYTES
RBC.: 4.17 MIL/uL (ref 3.87–5.11)
Retic Count, Absolute: 50 10*3/uL (ref 19.0–186.0)
Retic Ct Pct: 1.2 % (ref 0.4–3.1)

## 2014-09-29 NOTE — Progress Notes (Signed)
Subjective: This AM, she reported pain in her L hand. She reports that the numbness in her L leg is chronic ever since she had her knee surgery.   Brain MRI was unremarkable for CVA though did show mild but stable white matter disease. She reported to me that she had been told a number of years ago she had a brain tumor and calcium deposits in her head.   Objective: Vital signs in last 24 hours: Filed Vitals:   09/28/14 2050 09/29/14 0100 09/29/14 0500 09/29/14 1415  BP: 175/90 128/54 119/60 156/82  Pulse: 75 72 73 86  Temp: 98.2 F (36.8 C) 98.2 F (36.8 C) 98.2 F (36.8 C) 98.5 F (36.9 C)  TempSrc: Oral Oral  Oral  Resp: 18 18 16 18   Height: 5\' 8"  (1.727 m)     Weight: 307 lb 12.8 oz (139.617 kg)     SpO2: 99% 98% 100% 100%   Weight change:  No intake or output data in the 24 hours ending 09/29/14 1637  General: resting in bed, NAD HEENT: PERRL, EOMI, no scleral icterus, oropharynx clear, moist mucous membranes Chest: scar present along sternum with overlying keloid Cardiac: RRR, no rubs, murmurs or gallops though limited by body habitis Pulm: clear to auscultation bilaterally, no wheezes, rales, or rhonchi though sounds limited by body habitus Abd: soft, nontender, nondistended, BS present Ext: warm and well perfused, no pedal edema, BLE without warmth or edema Neuro: responds to questions appropriately; moving all extremities freely  Anemia Panel:  Recent Labs Lab 09/29/14 0350  RETICCTPCT 1.2   Studies/Results: Ct Head Wo Contrast  09/28/2014   CLINICAL DATA:  New onset left hemisphere weakness- Pt reports pain and numbness if left wrist x 3 mos, but onset today entire left hemisphere (Left upper and lower extremity, as well as left facial numbness Pt had CAD with recent CABG and HTN  EXAM: CT HEAD WITHOUT CONTRAST  TECHNIQUE: Contiguous axial images were obtained from the base of the skull through the vertex without intravenous contrast.  COMPARISON:  02/01/2013.   FINDINGS: Patchy white matter low density in both cerebral hemispheres. Normal size and position of the ventricles. No intracranial hemorrhage, mass lesion or CT evidence of acute infarction. Unremarkable bones and included paranasal sinuses.  IMPRESSION: No acute abnormality. Minimal chronic small vessel white matter ischemic changes in both cerebral hemispheres.   Electronically Signed   By: Enrique Sack M.D.   On: 09/28/2014 14:12   Mr Brain Wo Contrast  09/29/2014   CLINICAL DATA:  Three day history of left-sided numbness. Numbness involves the left upper extremity and face. She had similar episodes on the right side but related to coronary artery disease.  EXAM: MRI HEAD WITHOUT CONTRAST  TECHNIQUE: Multiplanar, multiecho pulse sequences of the brain and surrounding structures were obtained without intravenous contrast.  COMPARISON:  CT head without contrast 09/28/2014.  MRI 02/04/2011  FINDINGS: The diffusion-weighted images demonstrate no evidence for acute or subacute infarction. No hemorrhage or mass lesion is present.  Mild periventricular and scattered subcortical T2 hyperintensities are similar to the prior MRI. The ventricles are of normal size. No significant extraaxial fluid collection is present.  Flow is present in the major intracranial arteries. Midline structures are within normal limits. The globes and orbits are intact. The paranasal sinuses are clear. There is minimal fluid in the inferior mastoid air cells on the left. No obstructing nasopharyngeal lesion is evident. 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  IMPRESSION: 1.  No acute intracranial abnormality. 2. Stable mild white matter disease, advanced for age. The finding is nonspecific but can be seen in the setting of chronic microvascular ischemia, a demyelinating process such as multiple sclerosis, vasculitis, complicated migraine headaches, or as the sequelae of a prior infectious or inflammatory process.   Electronically Signed    By: Lawrence Santiago M.D.   On: 09/29/2014 13:11   Dg Chest Port 1 View  09/28/2014   CLINICAL DATA:  Left arm pain, bronchitis  EXAM: PORTABLE CHEST - 1 VIEW  COMPARISON:  03/27/2014  FINDINGS: Increased interstitial markings/bronchitic changes. No focal consolidation. No pleural effusion or pneumothorax.  Cardiomegaly.  Postsurgical changes related to prior CABG.  IMPRESSION: Increased interstitial markings/bronchitic changes.   Electronically Signed   By: Julian Hy M.D.   On: 09/28/2014 14:29   Medications: I have reviewed the patient's current medications. Scheduled Meds: . aspirin EC  81 mg Oral Daily  . cycloSPORINE  1 drop Both Eyes BID  . enoxaparin (LOVENOX) injection  40 mg Subcutaneous Q24H  . furosemide  40 mg Oral Daily  . pantoprazole  40 mg Oral Daily  . potassium chloride  10 mEq Oral Daily  . rosuvastatin  20 mg Oral q1800   Continuous Infusions:  PRN Meds:.albuterol, fluticasone, hydrALAZINE Assessment/Plan:  [name] is a 58 year old female with HTN, CAD s/p CABG (Feb 2015), HLD who presents with a 3-day history of left-sided numbness.   L-sided numbness: Unlikely CVA given MRI findings and nonfocal neurologic exam today at bedside performed by Dr. Beryle Beams. Leg numbness unrelated as it's chronic. Arm numbness likely related to the pain she is experiencing in her hand as she described a similar sensation yesterday when the BP cuff tightened around her arm. -Follow-up as outpatient with possible referral to Sports Medicine  HTN: Continue home meds.  CAD: Continue ASA  HLD: Continue Crestor  #FEN:  -Diet: Heart Healthy/Carb Modified  #DVT prophylaxis: Lovenox  #CODE STATUS: FULL CODE   Dispo: Disposition is deferred at this time, awaiting improvement of current medical problems.  Anticipated discharge today.  The patient does have a current PCP (Madilyn Fireman, MD) and does need an [facility] hospital follow-up appointment after discharge.  The  patient does have transportation limitations that hinder transportation to clinic appointments.  .Services Needed at time of discharge: Y = Yes, Blank = No PT:   OT:   RN:   Equipment:   Other:     LOS: 1 day   Charlott Rakes, MD 09/29/2014, 4:37 PM

## 2014-09-29 NOTE — Discharge Summary (Signed)
Name: [name] MRN: [id number] DOB: Jan 18, 1956 58 y.o. PCP: Tracy Fireman, MD  Date of Admission: 09/28/2014 12:29 PM Date of Discharge: 09/29/2014 Attending Physician: Annia Belt, MD  Discharge Diagnosis: Active Problems:   Numbness and tingling of left arm and leg   Paresthesias/numbness  Discharge Medications:   Medication List    TAKE these medications        albuterol 108 (90 BASE) MCG/ACT inhaler  Commonly known as:  PROVENTIL HFA;VENTOLIN HFA  Inhale 2 puffs into the lungs every 6 (six) hours as needed. Wheezing     aspirin EC 81 MG tablet  Take 81 mg by mouth daily.     [employer] SENNA PLUS 8.6-50 MG per tablet  Generic drug:  senna-docusate  Take 1 tablet by mouth at bedtime as needed (constipation).     cycloSPORINE 0.05 % ophthalmic emulsion  Commonly known as:  RESTASIS  Place 1 drop into both eyes 2 (two) times daily.     diclofenac sodium 1 % Gel  Commonly known as:  VOLTAREN  Apply 2 g topically 2 (two) times daily as needed (joint pain).     Fluocinolone Acetonide 0.01 % Oil  Place 1 drop into both ears daily as needed (itchy ears).     fluticasone 50 MCG/ACT nasal spray  Commonly known as:  FLONASE  Place 1 spray into both nostrils daily as needed for allergies or rhinitis.     furosemide 40 MG tablet  Commonly known as:  LASIX  Take 40 mg by mouth daily.     lisinopril 2.5 MG tablet  Commonly known as:  PRINIVIL,ZESTRIL  TAKE 1 TABLET (2.5 MG TOTAL) BY MOUTH DAILY.     metoprolol tartrate 25 MG tablet  Commonly known as:  LOPRESSOR  Take 25 mg by mouth 2 (two) times daily.     naproxen 500 MG tablet  Commonly known as:  NAPROSYN  Take 1 tablet (500 mg total) by mouth 2 (two) times daily with a meal.     NEXIUM 40 MG capsule  Generic drug:  esomeprazole  TAKE ONE CAPSULE BY MOUTH EVERY DAY     potassium chloride 10 MEQ tablet  Commonly known as:  K-DUR  Take 10 mEq by mouth daily.     rosuvastatin 20 MG tablet    Commonly known as:  CRESTOR  Take 1 tablet (20 mg total) by mouth daily.        Disposition and follow-up:   TracyTracy Griffin was discharged from [facility] in Stable condition.  At the hospital follow up visit please address:  1.  Left hand pain, presumably de Quervain's tenosynovitis: referral to Sports Medicine  2.  Labs / imaging needed at time of follow-up: none  3.  Pending labs/ test needing follow-up: none  Follow-up Appointments: Follow-up Information    Follow up with Tracy Fireman, MD. Go on 10/08/2014.   Specialty:  Internal Medicine   Why:  751 AM   Contact information:   [address] [place] [address] [phone]       Discharge Instructions: Discharge Instructions    Call MD for:  difficulty breathing, headache or visual disturbances    Complete by:  As directed      Call MD for:  persistant dizziness or light-headedness    Complete by:  As directed      Call MD for:  severe uncontrolled pain    Complete by:  As directed  Diet - low sodium heart healthy    Complete by:  As directed      Increase activity slowly    Complete by:  As directed            Consultations:    Procedures Performed:  Ct Head Wo Contrast  09/28/2014   CLINICAL DATA:  New onset left hemisphere weakness- Pt reports pain and numbness if left wrist x 3 mos, but onset today entire left hemisphere (Left upper and lower extremity, as well as left facial numbness Pt had CAD with recent CABG and HTN  EXAM: CT HEAD WITHOUT CONTRAST  TECHNIQUE: Contiguous axial images were obtained from the base of the skull through the vertex without intravenous contrast.  COMPARISON:  02/01/2013.  FINDINGS: Patchy white matter low density in both cerebral hemispheres. Normal size and position of the ventricles. No intracranial hemorrhage, mass lesion or CT evidence of acute infarction. Unremarkable bones and included paranasal sinuses.  IMPRESSION: No acute  abnormality. Minimal chronic small vessel white matter ischemic changes in both cerebral hemispheres.   Electronically Signed   By: Enrique Sack M.D.   On: 09/28/2014 14:12   Mr Brain Wo Contrast  09/29/2014   CLINICAL DATA:  Three day history of left-sided numbness. Numbness involves the left upper extremity and face. She had similar episodes on the right side but related to coronary artery disease.  EXAM: MRI HEAD WITHOUT CONTRAST  TECHNIQUE: Multiplanar, multiecho pulse sequences of the brain and surrounding structures were obtained without intravenous contrast.  COMPARISON:  CT head without contrast 09/28/2014.  MRI 02/04/2011  FINDINGS: The diffusion-weighted images demonstrate no evidence for acute or subacute infarction. No hemorrhage or mass lesion is present.  Mild periventricular and scattered subcortical T2 hyperintensities are similar to the prior MRI. The ventricles are of normal size. No significant extraaxial fluid collection is present.  Flow is present in the major intracranial arteries. Midline structures are within normal limits. The globes and orbits are intact. The paranasal sinuses are clear. There is minimal fluid in the inferior mastoid air cells on the left. No obstructing nasopharyngeal lesion is evident. 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  IMPRESSION: 1. No acute intracranial abnormality. 2. Stable mild white matter disease, advanced for age. The finding is nonspecific but can be seen in the setting of chronic microvascular ischemia, a demyelinating process such as multiple sclerosis, vasculitis, complicated migraine headaches, or as the sequelae of a prior infectious or inflammatory process.   Electronically Signed   By: Lawrence Santiago M.D.   On: 09/29/2014 13:11   Dg Chest Port 1 View  09/28/2014   CLINICAL DATA:  Left arm pain, bronchitis  EXAM: PORTABLE CHEST - 1 VIEW  COMPARISON:  03/27/2014  FINDINGS: Increased interstitial markings/bronchitic changes. No focal  consolidation. No pleural effusion or pneumothorax.  Cardiomegaly.  Postsurgical changes related to prior CABG.  IMPRESSION: Increased interstitial markings/bronchitic changes.   Electronically Signed   By: Julian Hy M.D.   On: 09/28/2014 14:29    Admission HPI: Ms. Digiulio is a 58 year old female with HTN, CAD s/p CABG (Feb 2015), HLD who presents with a 3-day history of left-sided numbness.  She does not recall an inciting factor but reports that her left leg has been numb for the past three years ever since she had surgery. Over the last three days, the numbness now has gone up to her knees as of today. Today, she noted numbness in the left face, left arm similar  to when she had it on the right side of her body which warranted a CABG back in February, so she came to the ED.   At night, she reports burning and shooting pain that goes up to her knee but only on her left side. She also has chronic back pain and became tearful when explaining she had a fracture. She reports headaches (since the last week), blurry vision, dizziness, chest pain (located in surgery incision site), shortness of breath (just today), abdominal pain, nausea but denies dysarthria, dysphagia, dysuria.   Hospital Course by problem list:   Numbness and tingling of L arm and leg: Likely 2/2 left hand pain. Head CT and brain MRI were reassuring for acute CVA though mild stable white matter disease was noted on the MRI. Troponins negative x 3 and stable EKG findings were reassuring to rule out ACS as she reported similar symptoms prior to her CABG in February 2015. During her hospital stay, she reported baseline numbness in her L leg following her knee surgery and attributed much of her current symptoms to the pain she experienced in her left hand, presumably de Quervain's tenosynovitis based off physical exam. She should be referred to Sports Medicine for further management as well as Neurology should she experience persistent  neurologic symptoms.  HTN: Remained stable on home medications.  CAD: Remained stable on home medications.  HLD: Remained stable on home medications.   Discharge Vitals:   BP 156/82 mmHg  Pulse 86  Temp(Src) 98.5 F (36.9 C) (Oral)  Resp 18  Ht 5\' 8"  (1.727 m)  Wt 307 lb 12.8 oz (139.617 kg)  BMI 46.81 kg/m2  SpO2 100%  LMP 01/13/2014  Discharge Labs:  Results for orders placed or performed during the hospital encounter of 09/28/14 (from the past 24 hour(s))  Troponin I     Status: None   Collection Time: 09/28/14  8:55 PM  Result Value Ref Range   Troponin I <0.30 <0.30 ng/mL  TSH     Status: None   Collection Time: 09/28/14  8:55 PM  Result Value Ref Range   TSH 1.950 0.350 - 4.500 uIU/mL  Reticulocytes     Status: None   Collection Time: 09/29/14  3:50 AM  Result Value Ref Range   Retic Ct Pct 1.2 0.4 - 3.1 %   RBC. 4.17 3.87 - 5.11 MIL/uL   Retic Count, Manual 50.0 19.0 - 186.0 K/uL    Signed: Charlott Rakes, MD 09/30/2014, 5:38 PM    Services Ordered on Discharge: None Equipment Ordered on Discharge: None

## 2014-09-29 NOTE — Discharge Instructions (Signed)
Thank you for trusting [place] with your medical care!  You were hospitalized for numbness. Your brain scan did NOT show you had a stroke.   For your left hand pain, we will refer you to Sports Medicine to get injection to help ease the pain.  Please follow-up with Dr. Ellwood Dense on Tuesday, 10/08/14 at 9:15AM.

## 2014-09-30 LAB — IRON AND TIBC
Iron: 24 ug/dL — ABNORMAL LOW (ref 42–135)
SATURATION RATIOS: 6 % — AB (ref 20–55)
TIBC: 399 ug/dL (ref 250–470)
UIBC: 375 ug/dL (ref 125–400)

## 2014-09-30 LAB — FERRITIN: Ferritin: 6 ng/mL — ABNORMAL LOW (ref 10–291)

## 2014-09-30 LAB — HIV ANTIBODY (ROUTINE TESTING W REFLEX): HIV 1&2 Ab, 4th Generation: NONREACTIVE

## 2014-09-30 LAB — HEMOGLOBIN A1C
Hgb A1c MFr Bld: 5.1 % (ref <5.7)
Mean Plasma Glucose: 100 mg/dL (ref <117)

## 2014-09-30 LAB — URINE CULTURE: Colony Count: 40000

## 2014-09-30 LAB — VITAMIN B12: VITAMIN B 12: 621 pg/mL (ref 211–911)

## 2014-10-01 NOTE — Progress Notes (Signed)
UR Completed Jerlean Peralta Graves-Bigelow, RN,BSN [phone]  

## 2014-10-08 ENCOUNTER — Ambulatory Visit (HOSPITAL_COMMUNITY)
Admission: RE | Admit: 2014-10-08 | Discharge: 2014-10-08 | Disposition: A | Discharge status: Home / Self Care | Payer: Medicare Other | Source: Ambulatory Visit | Attending: Internal Medicine | Admitting: Internal Medicine

## 2014-10-08 ENCOUNTER — Ambulatory Visit (INDEPENDENT_AMBULATORY_CARE_PROVIDER_SITE_OTHER): Payer: Medicare Other | Admitting: Internal Medicine

## 2014-10-08 ENCOUNTER — Encounter: Payer: Self-pay | Admitting: Internal Medicine

## 2014-10-08 VITALS — BP 142/73 | HR 83 | Temp 97.9°F | Ht 68.0 in | Wt 311.2 lb

## 2014-10-08 DIAGNOSIS — R002 Palpitations: Secondary | ICD-10-CM

## 2014-10-08 DIAGNOSIS — Z0000 Encounter for general adult medical examination without abnormal findings: Secondary | ICD-10-CM

## 2014-10-08 DIAGNOSIS — I10 Essential (primary) hypertension: Secondary | ICD-10-CM

## 2014-10-08 DIAGNOSIS — M654 Radial styloid tenosynovitis [de Quervain]: Secondary | ICD-10-CM

## 2014-10-08 DIAGNOSIS — Z951 Presence of aortocoronary bypass graft: Secondary | ICD-10-CM

## 2014-10-08 DIAGNOSIS — L910 Hypertrophic scar: Secondary | ICD-10-CM

## 2014-10-08 DIAGNOSIS — R000 Tachycardia, unspecified: Secondary | ICD-10-CM | POA: Insufficient documentation

## 2014-10-08 DIAGNOSIS — R200 Anesthesia of skin: Secondary | ICD-10-CM

## 2014-10-08 DIAGNOSIS — R202 Paresthesia of skin: Secondary | ICD-10-CM

## 2014-10-08 MED ORDER — CYCLOSPORINE 0.05 % OP EMUL: 1.0000 [drp] | Freq: Two times a day (BID) | OPHTHALMIC | Status: DC

## 2014-10-08 MED ORDER — POTASSIUM CHLORIDE ER 10 MEQ PO TBCR: 10.0000 meq | EXTENDED_RELEASE_TABLET | ORAL | Status: DC

## 2014-10-08 MED ORDER — FUROSEMIDE 20 MG PO TABS: 40.0000 mg | ORAL_TABLET | Freq: Every day | ORAL | Status: DC

## 2014-10-08 NOTE — Patient Instructions (Signed)
[name],  We have set up referrals with neurology and sports medicine.  Please follow up with cardiology.  For your keloid - you are going to dermatology.  Please take lasix 20 mg daily and potassium every other day - until changed by your doctor. Please come back in 1 month for a repeat check up and BMP.   Thanks, Madilyn Fireman MD MPH 10/08/2014 10:14 AM

## 2014-10-09 DIAGNOSIS — Z0000 Encounter for general adult medical examination without abnormal findings: Secondary | ICD-10-CM | POA: Insufficient documentation

## 2014-10-09 LAB — BASIC METABOLIC PANEL WITH GFR
BUN: 10 mg/dL (ref 6–23)
CO2: 27 meq/L (ref 19–32)
Calcium: 8.9 mg/dL (ref 8.4–10.5)
Chloride: 102 mEq/L (ref 96–112)
Creat: 0.82 mg/dL (ref 0.50–1.10)
GFR, Est African American: >89 mL/min
GFR, Est Non African American: 78 mL/min
Glucose, Bld: 137 mg/dL — ABNORMAL HIGH (ref 70–99)
POTASSIUM: 3.8 meq/L (ref 3.5–5.3)
Sodium: 140 mEq/L (ref 135–145)

## 2014-10-09 NOTE — Assessment & Plan Note (Signed)
Patient has tried multiple medical modalities. Patient to be referred to sports medicine.

## 2014-10-09 NOTE — Assessment & Plan Note (Signed)
Unsure of etiology. Recently had stroke ruled out. ? Demyelination syndrome, polyneuropathy.  Given the continued symptoms, non-specific changes seen in MRI and patient wishes, I will make a neurology referral.

## 2014-10-09 NOTE — Progress Notes (Signed)
Subjective:     Patient ID: [name], female   DOB: March 11, 1956, 58 y.o.   MRN: [id number]  HPI Ms Davison is here for a hospital follow up.   Hospital follow up She was discharged from [facility] after being admitted for left sided numbness and tingling. She underwent a series of tests, including a brain MRI on 09/29/14 impression as below:  BRAIN MRI IMPRESSION: 1. No acute intracranial abnormality. 2. Stable mild white matter disease, advanced for age. The finding is nonspecific but can be seen in the setting of chronic microvascular ischemia, a demyelinating process such as multiple sclerosis, vasculitis, complicated migraine headaches, or as the sequelae of a prior infectious or inflammatory process.  She reports to have continued numbness and tingling on left side without sensory deficits or hyperesthesia. She feels no weakness on her left side as compared to the left. She denies facial droop, dizziness, syncope or presyncope.   Left lateral wrist pain She continues to have it from time to time. Naprosyn makes it better, however she feels drwosy with naprosyn. She does not want to take it any more.   Palpitations She complains of feeling palpitations transiently at night off an on. She denies nausea, diaphoresis, vomiting, or being lightheaded. She denies falls.  CAD S/p CABG She has done well since February when she underwent CABG. No chest pressure, tightness etc. Follows up with [facility] cardiology. Reports some mild swelling in her legs, which she thinks is better than before. She has had a weight gain of 5-6 lbs since last visit in August. Mentions that she has not been taking her lasix 40 mg daily. She still feels sore in her chest from her surgery. She developed keloids at the incision site.   Review of Systems  Constitutional: Positive for unexpected weight change. Negative for fever, chills and fatigue.  Respiratory: Negative for chest tightness and shortness of  breath (at baseline).   Cardiovascular: Positive for palpitations and leg swelling (at baseline). Negative for chest pain.  Gastrointestinal: Negative for nausea, vomiting and constipation.  Musculoskeletal: Positive for arthralgias.  Skin: Negative for rash.  Neurological: Positive for numbness. Negative for dizziness, tremors, seizures, syncope, facial asymmetry, speech difficulty, weakness, light-headedness and headaches.       Objective:   Physical Exam  Constitutional: She is oriented to person, place, and time. No distress.  obese  HENT:  Mouth/Throat: Oropharynx is clear and moist.  Eyes: Conjunctivae are normal. Pupils are equal, round, and reactive to light.  Neck: Normal range of motion.  Cardiovascular: Normal rate, regular rhythm, normal heart sounds and intact distal pulses.  Exam reveals no gallop and no friction rub.   No murmur heard. Pulmonary/Chest: Effort normal and breath sounds normal. No respiratory distress. She has no wheezes. She has no rales. She exhibits no tenderness.  Abdominal: Soft.  Musculoskeletal: She exhibits edema (lower extremity 1+ bilateral, L>R is usual). She exhibits no tenderness.  Neurological: She is alert and oriented to person, place, and time.  Skin: Skin is warm. No rash noted.       Assessment & Plan:

## 2014-10-09 NOTE — Assessment & Plan Note (Signed)
EKG done.  My interpretation: Regular rhythm, no extra beats, 76 bpm as shown, P waves present, normal QRS, Twave abnormality non-specific for any major cardiac issue, QTc 470.  Patient needs to follow up with her cardiologist regarding CAD, she will bring this up as well for discussion. Might need holter or event monitor. TSH reviewed, normal in the recent past.

## 2014-10-09 NOTE — Assessment & Plan Note (Signed)
BP Readings from Last 3 Encounters:  10/08/14 142/73  09/29/14 156/82  07/25/14 133/80   Well controlled, continue current medications.

## 2014-10-09 NOTE — Assessment & Plan Note (Signed)
  Does not want mammograms yet. Her breasts are sore since February after CABG, she says.

## 2014-10-09 NOTE — Assessment & Plan Note (Signed)
Has appointment with dermatology in December.

## 2014-10-15 ENCOUNTER — Other Ambulatory Visit: Payer: Self-pay | Admitting: Internal Medicine

## 2014-10-15 NOTE — Telephone Encounter (Signed)
Rx refill sent to patient pharmacy   

## 2014-10-24 ENCOUNTER — Encounter: Payer: Self-pay | Admitting: Internal Medicine

## 2014-10-31 ENCOUNTER — Encounter (HOSPITAL_COMMUNITY): Payer: Self-pay | Admitting: Interventional Cardiology

## 2014-11-05 ENCOUNTER — Ambulatory Visit: Payer: Self-pay | Admitting: Internal Medicine

## 2014-11-07 ENCOUNTER — Encounter: Payer: Self-pay | Admitting: Internal Medicine

## 2014-11-07 ENCOUNTER — Ambulatory Visit (INDEPENDENT_AMBULATORY_CARE_PROVIDER_SITE_OTHER): Payer: Medicare Other | Admitting: Internal Medicine

## 2014-11-07 ENCOUNTER — Ambulatory Visit: Payer: Self-pay | Admitting: Internal Medicine

## 2014-11-07 VITALS — BP 146/63 | HR 82 | Temp 98.1°F | Ht 68.0 in | Wt 313.9 lb

## 2014-11-07 DIAGNOSIS — I10 Essential (primary) hypertension: Secondary | ICD-10-CM

## 2014-11-07 DIAGNOSIS — M170 Bilateral primary osteoarthritis of knee: Secondary | ICD-10-CM

## 2014-11-07 DIAGNOSIS — M1711 Unilateral primary osteoarthritis, right knee: Secondary | ICD-10-CM | POA: Insufficient documentation

## 2014-11-07 DIAGNOSIS — Z0000 Encounter for general adult medical examination without abnormal findings: Secondary | ICD-10-CM

## 2014-11-07 DIAGNOSIS — L299 Pruritus, unspecified: Secondary | ICD-10-CM

## 2014-11-07 LAB — BASIC METABOLIC PANEL WITH GFR
BUN: 9 mg/dL (ref 6–23)
CALCIUM: 9.3 mg/dL (ref 8.4–10.5)
CO2: 27 mEq/L (ref 19–32)
Chloride: 101 mEq/L (ref 96–112)
Creat: 0.81 mg/dL (ref 0.50–1.10)
GFR, Est African American: >89 mL/min
GFR, Est Non African American: 79 mL/min
GLUCOSE: 106 mg/dL — AB (ref 70–99)
Potassium: 4.1 mEq/L (ref 3.5–5.3)
SODIUM: 136 meq/L (ref 135–145)

## 2014-11-07 MED ORDER — FLUOCINOLONE ACETONIDE 0.01 % OT OIL: 1.0000 [drp] | TOPICAL_OIL | Freq: Every day | OTIC | Status: DC | PRN

## 2014-11-07 NOTE — Patient Instructions (Signed)
Ms [name] to meet you today.  We will do some blood work because you have restarted your lasix and potassium. I congratulate you for starting some exercise which is helping with your pain and making you feel better. I am giving you some drops for your itchy ears, you have wax and dry scaly skin in your ear canal. If you feel ear pain, or hearing gets affected, call the clinic.  See you in 3 months Thanks, Madilyn Fireman MD MPH 11/07/2014 10:51 AM   Osteoarthritis Osteoarthritis is a disease that causes soreness and inflammation of a joint. It occurs when the cartilage at the affected joint wears down. Cartilage acts as a cushion, covering the ends of bones where they meet to form a joint. Osteoarthritis is the most common form of arthritis. It often occurs in older people. The joints affected most often by this condition include those in the:  Ends of the fingers.  Thumbs.  Neck.  Lower back.  Knees.  Hips. CAUSES  Over time, the cartilage that covers the ends of bones begins to wear away. This causes bone to rub on bone, producing pain and stiffness in the affected joints.  RISK FACTORS Certain factors can increase your chances of having osteoarthritis, including:  Older age.  Excessive body weight.  Overuse of joints.  Previous joint injury. SIGNS AND SYMPTOMS   Pain, swelling, and stiffness in the joint.  Over time, the joint may lose its normal shape.  Small deposits of bone (osteophytes) may grow on the edges of the joint.  Bits of bone or cartilage can break off and float inside the joint space. This may cause more pain and damage. DIAGNOSIS  Your health care provider will do a physical exam and ask about your symptoms. Various tests may be ordered, such as:  X-rays of the affected joint.  An MRI scan.  Blood tests to rule out other types of arthritis.  Joint fluid tests. This involves using a needle to draw fluid from the joint and examining the  fluid under a microscope. TREATMENT  Goals of treatment are to control pain and improve joint function. Treatment plans may include:  A prescribed exercise program that allows for rest and joint relief.  A weight control plan.  Pain relief techniques, such as:  Properly applied heat and cold.  Electric pulses delivered to nerve endings under the skin (transcutaneous electrical nerve stimulation [TENS]).  Massage.  Certain nutritional supplements.  Medicines to control pain, such as:  Acetaminophen.  Nonsteroidal anti-inflammatory drugs (NSAIDs), such as naproxen.  Narcotic or central-acting agents, such as tramadol.  Corticosteroids. These can be given orally or as an injection.  Surgery to reposition the bones and relieve pain (osteotomy) or to remove loose pieces of bone and cartilage. Joint replacement may be needed in advanced states of osteoarthritis. HOME CARE INSTRUCTIONS   Take medicines only as directed by your health care provider.  Maintain a healthy weight. Follow your health care provider's instructions for weight control. This may include dietary instructions.  Exercise as directed. Your health care provider can recommend specific types of exercise. These may include:  Strengthening exercises. These are done to strengthen the muscles that support joints affected by arthritis. They can be performed with weights or with exercise bands to add resistance.  Aerobic activities. These are exercises, such as brisk walking or low-impact aerobics, that get your heart pumping.  Range-of-motion activities. These keep your joints limber.  Balance and agility exercises. These  help you maintain daily living skills.  Rest your affected joints as directed by your health care provider.  Keep all follow-up visits as directed by your health care provider. SEEK MEDICAL CARE IF:   Your skin turns red.  You develop a rash in addition to your joint pain.  You have  worsening joint pain.  You have a fever along with joint or muscle aches. SEEK IMMEDIATE MEDICAL CARE IF:  You have a significant loss of weight or appetite.  You have night sweats. [place] of Arthritis and Musculoskeletal and Skin Diseases: www.niams.SouthExposed.es  [employer] on Aging: http://kim-miller.com/  American College of Rheumatology: www.rheumatology.org Document Released: 11/08/2005 Document Revised: 03/25/2014 Document Reviewed: 07/16/2013 [facility] Patient Information 2015 [place], [place]. This information is not intended to replace advice given to you by your health care provider. Make sure you discuss any questions you have with your health care provider.

## 2014-11-07 NOTE — Assessment & Plan Note (Signed)
Likely due to wax and dryness.  Flucinolone Acetonide otic drops given for PRN use.

## 2014-11-07 NOTE — Progress Notes (Signed)
   Subjective:    Patient ID: [name], female    DOB: [birth year], 58 y.o.   MRN: [id number]  HPI [name] is here to follow up on her hypertension. She was last seen last month when she reported that she was not taking her lasix and potassium pills. She had promised to restart her pills, which she has and she back for a BMP check. She otherwise feels very well. She denies any other complaints. She has started walking as a means of exercise and feels that her body pains are better. She has lost 1-3 lbs according to her report. Her left wrist pain is better and she is going to see Sports medicine on Friday for it. She reports that she had coffee just before her blood pressure measurement in clinic today and she attributes her blood pressure elevation to that. New complaint - itchy ears, no discharge or pain.   Review of Systems  Respiratory: Negative for apnea, cough, choking, chest tightness, shortness of breath, wheezing and stridor.   Musculoskeletal: Positive for back pain and arthralgias.  Skin: Negative for rash.  Neurological: Negative for dizziness.       Objective:   Physical Exam  Constitutional: She is oriented to person, place, and time. She appears well-developed and well-nourished.  Eyes: Conjunctivae and EOM are normal. Pupils are equal, round, and reactive to light.  Cardiovascular: Normal rate, regular rhythm, normal heart sounds and intact distal pulses.   No murmur heard. Musculoskeletal: She exhibits edema (L>R chronic).  Neurological: She is alert and oriented to person, place, and time.      Assessment & Plan:   Please see problem based charting.

## 2014-11-07 NOTE — Assessment & Plan Note (Signed)
BP Readings from Last 3 Encounters:  11/07/14 146/63  10/08/14 142/73  09/29/14 156/82   Doing well. We will do BMP today since she restarted lasix and potassium a month ago.

## 2014-11-07 NOTE — Assessment & Plan Note (Signed)
Feels better overall with physical activity and rest when needed.

## 2014-11-07 NOTE — Assessment & Plan Note (Signed)
Flu shot for this year done.

## 2014-11-08 ENCOUNTER — Ambulatory Visit: Payer: Medicare Other | Admitting: Sports Medicine

## 2014-11-10 IMAGING — CR DG CHEST 1V PORT
1 series · 1 of 1 positions shown · non-contrast
Comparison: DG CHEST 2 VIEW dated 01/05/2014

CLINICAL DATA: CABG.

EXAM:
PORTABLE CHEST - 1 VIEW

[AP]
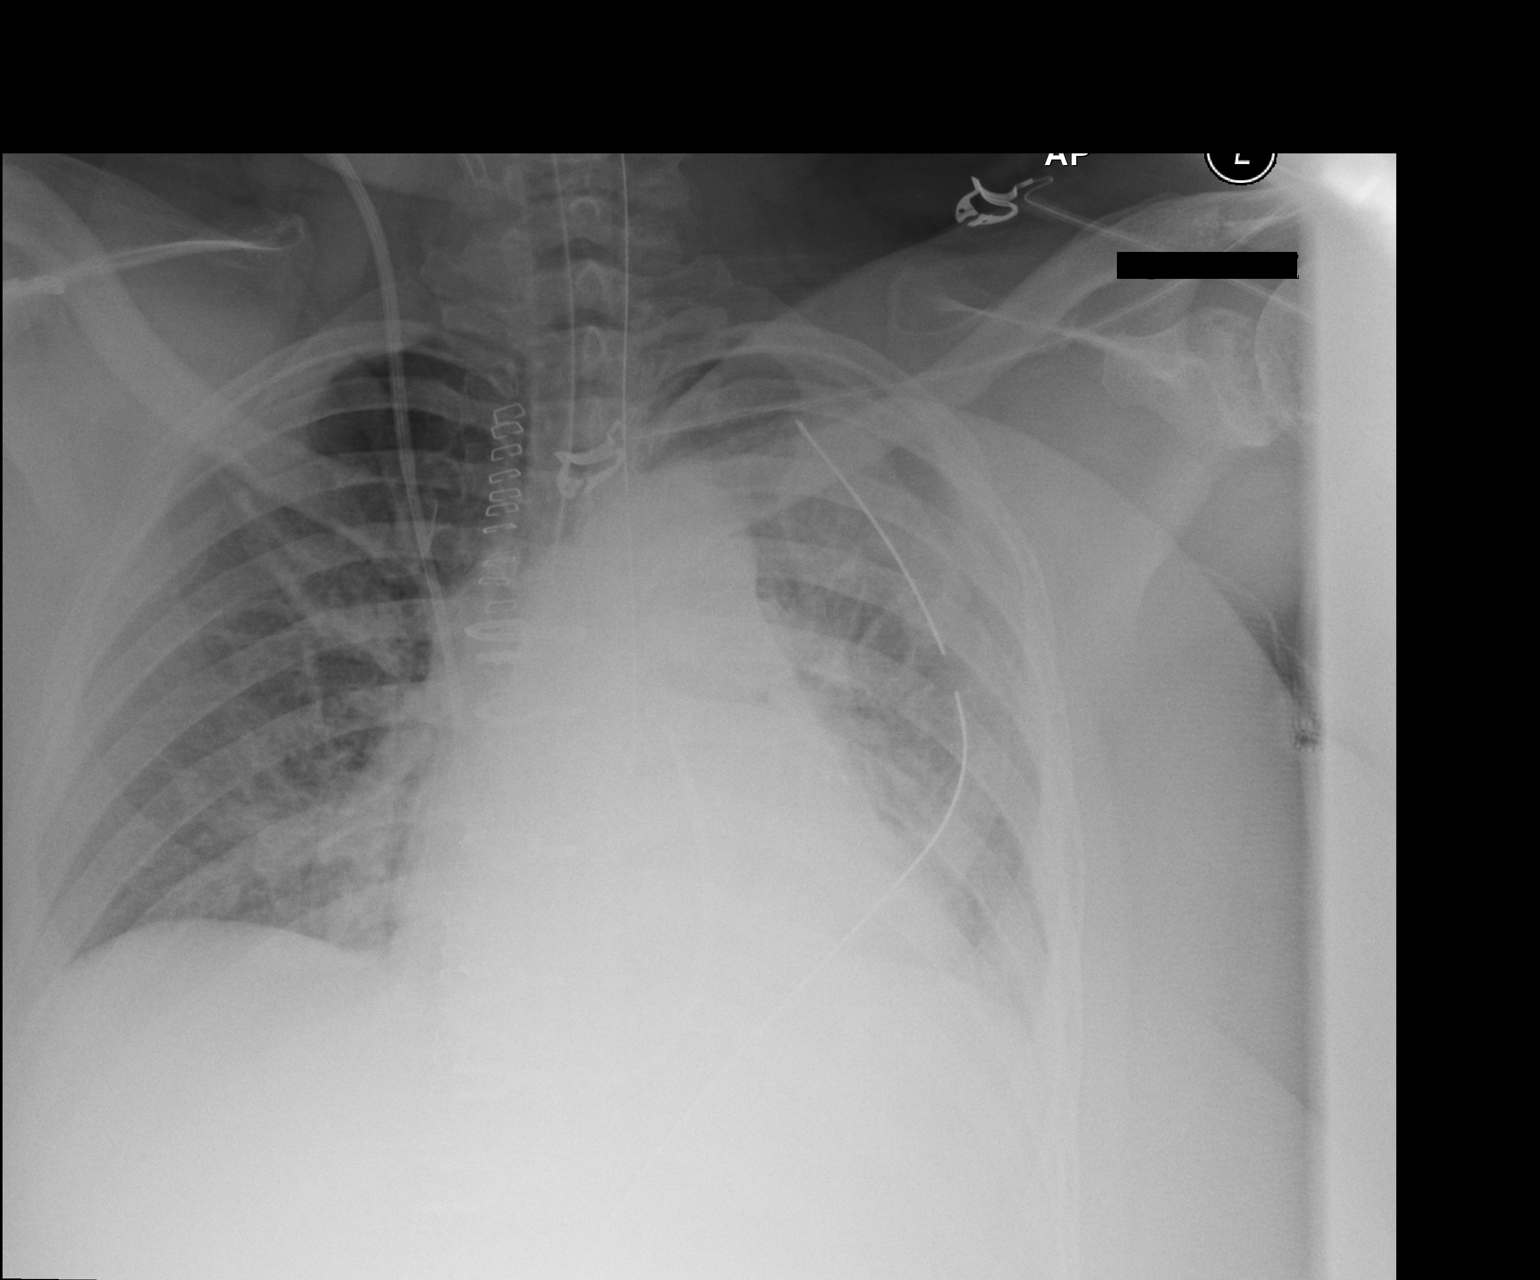

[1 of 1 positions shown; findings below may reference images not displayed]

FINDINGS: Endotracheal tube approximately 8 mm above the carina, proximal
repositioning should be considered. Left chest tube noted with tip
projected over the left apex. No definite pneumothorax noted. Lung
markings are noted in the apex. This region however is difficult to
evaluate due to overlying soft tissues. Follow-up chest x-ray
suggested. Swan-Ganz catheter noted with tip projected over
pulmonary outflow tract. NG tube noted, distal tip is difficult to
discern but appears to be below the left hemidiaphragm. Low lung
volumes with bilateral perihilar and basilar atelectasis versus
infiltrates. Left pleural effusion cannot be excluded. pneumothorax.
IMPRESSION: 1. Endotracheal tube noted 8 mm above the carina, proximal
repositioning suggested.
2. Line and tube positions otherwise good.
3. Low lung volumes with bilateral atelectatic and/or infiltrative
changes. These results will be called to the ordering clinician or
representative by the Radiologist Assistant, and communication
documented in the PACS Dashboard.

## 2014-11-12 ENCOUNTER — Ambulatory Visit: Payer: Self-pay | Admitting: Internal Medicine

## 2014-11-12 ENCOUNTER — Other Ambulatory Visit: Payer: Self-pay | Admitting: Internal Medicine

## 2014-11-25 ENCOUNTER — Ambulatory Visit: Payer: Self-pay | Admitting: Sports Medicine

## 2014-11-28 ENCOUNTER — Ambulatory Visit (INDEPENDENT_AMBULATORY_CARE_PROVIDER_SITE_OTHER): Payer: Medicare Other | Admitting: Internal Medicine

## 2014-11-28 ENCOUNTER — Encounter: Payer: Self-pay | Admitting: Internal Medicine

## 2014-11-28 VITALS — BP 142/86 | HR 75 | Ht 68.0 in | Wt 314.7 lb

## 2014-11-28 DIAGNOSIS — E785 Hyperlipidemia, unspecified: Secondary | ICD-10-CM

## 2014-11-28 DIAGNOSIS — I2510 Atherosclerotic heart disease of native coronary artery without angina pectoris: Secondary | ICD-10-CM

## 2014-11-28 DIAGNOSIS — R002 Palpitations: Secondary | ICD-10-CM

## 2014-11-28 DIAGNOSIS — I10 Essential (primary) hypertension: Secondary | ICD-10-CM

## 2014-11-28 DIAGNOSIS — Z951 Presence of aortocoronary bypass graft: Secondary | ICD-10-CM

## 2014-11-28 NOTE — Patient Instructions (Signed)
Your physician wants you to follow-up in: 6 months with Dr. Debara Pickett. You will receive a reminder letter in the mail two months in advance. If you don't receive a letter, please call our office to schedule the follow-up appointment.   Dr. Debara Pickett has referred you to cardiac rehab - maintenance program @ [facility].

## 2014-11-28 NOTE — Progress Notes (Signed)
OFFICE NOTE  Chief Complaint:  Palpitations  Primary Care Physician: Madilyn Fireman, MD  HPI:  [name] is a 59 year old African American obese female presented to 01/01/14 with symptoms of unstable angina. I had seen her once in 2012, but not since then.  She recently had been having chest pain and presented urgently when she became short of breath at home and had chest tightness, which started as right arm pain and right chest pain, then moved substernally. She ultimately underwent cardiac catheterization via right radial artery which demonstrated severe three-vessel CAD with graftable target vessels. Echocardiogram showed good LV function with some left ventricular hypertrophy from her hypertension and there does not appear to be significant valve disease. Carotid dopplers showed a 1-39% bilateral carotid artery stenosis. She underwent a CABG x 5 on 01/09/2014. Left internal mammary artery to LAD, saphenous vein graft to diagonal, sequential saphenous vein graft to OM1 and OM2, saphenous vein graft to posterior descending. She had multiple teeth extractions prior to surgery. She did well post op. She was anemic but did not need transfusion. At discharge she went to SNF for rehab-she did not stay the recommended time frame due to BR not being available when she needed it. She was not given discharge medications when she left SNF. She saw Cecilie Kicks, FNP, in followup for her hospitalization and was restarted on some of her medications.   [name] was seen today in the office for follow-up. At her last office visit I cleared her for surgery and she underwent hysterectomy and has had resolution of her bleeding problems. She is now contemplating right knee surgery and also breast reduction surgery. She's in the interim seen in the hospital for palpitations. She reports those have improved with medication adjustment. She's decreased her Lasix now to every other day and I think she can  probably take it as needed. She denies any chest pain worsening shortness of breath. She is undergoing injections to reduce the size of her keloid scar.  PMHx:  Past Medical History  Diagnosis Date  . Seasonal allergies   . Asthma     History of  . Hypertension   . Iron deficiency 12-07-2011  . Anemia   . GERD (gastroesophageal reflux disease)   . Heart murmur   . Osteoporosis   . PONV (postoperative nausea and vomiting)   . Dysrhythmia     occ palpitations   . Sleep apnea     no CPAP machine; sleep study 02/2010 REM AHI 61.7/hr, total sleep REM 14.8/hr  . Headache(784.0)     occasional   . Osteoarthritis   . Morbid obesity   . Acute urinary retention s/p Foley 01/07/2012  . Hemorrhoids, internal, with bleeding & prolapse 12/13/2011  . Myocardial infarction     unsure when; per cardiologist report  . Chronic headaches   . Coronary artery disease   . Anginal pain   . Dyslipidemia   . S/P CABG (coronary artery bypass graft)     x5 - LIMA to LAD, SVG to diagonal, SVG to OM1, SVG to OM2, SVG to PDA (Dr. Prescott Gum)  . Postmenopausal bleeding   . Neuropathy   . Blood dyscrasia     ABNORMAL VAGINAL BLEEDING  . Uterine cancer 7/15    clinical stage IA grade 1 endometrioid endometrial cancer    Past Surgical History  Procedure Laterality Date  . Knee surgery Bilateral 1999  . Total knee arthroplasty Left 04/29/2011  . Multiple tooth  extractions    . Wisdom tooth extraction    . Colonoscopy  2009  . Knee arthroscopy  1991  . Transanal hemorrhoidal dearterliaization  01/06/12    with external hemorrhoid removal  . Coronary artery bypass graft N/A 01/09/2014    Procedure: CORONARY ARTERY BYPASS GRAFTING (CABG) x 5 using left internal mammary artery and right leg greater saphenous vein harvested endoscopically;  Surgeon: Ivin Poot, MD;  Location: [place];  Service: Open Heart Surgery;  Laterality: N/A;  please use bed extenders and breast binder  . Intraoperative transesophageal  echocardiogram N/A 01/09/2014    Procedure: INTRAOPERATIVE TRANSESOPHAGEAL ECHOCARDIOGRAM;  Surgeon: Ivin Poot, MD;  Location: [place];  Service: Open Heart Surgery;  Laterality: N/A;  . Tee without cardioversion  01/2010    EF 60-65%, small, flat, non-infiltrating, calcified, fixed apical/septal mass  . Nm myocar perf wall motion  01/2010    dipyridamole myoview - moderate perfusion defect in basal inferoseptal, basal inferior, mid inferoseptal, mid inferior, apical inferior region; EF 56%  . Cardiac catheterization    . Multiple extractions with alveoloplasty N/A 04/11/2014    Procedure: Extraction of tooth #'s 1,2,8,16 with alveoloplasty, maxillary tuberosity reductions, and gross debridement of remaining teeth.;  Surgeon: Lenn Cal, DDS;  Location: [place];  Service: Oral Surgery;  Laterality: N/A;  . Vaginal hysterectomy N/A 06/03/2014    Procedure: HYSTERECTOMY VAGINAL;  Surgeon: Lavonia Drafts, MD;  Location: [facility];  Service: Gynecology;  Laterality: N/A;  . Unilateral salpingectomy Right 06/03/2014    Procedure: UNILATERAL SALPINGECTOMY;  Surgeon: Lavonia Drafts, MD;  Location: [facility];  Service: Gynecology;  Laterality: Right;  . Left heart catheterization with coronary angiogram N/A 01/04/2014    Procedure: LEFT HEART CATHETERIZATION WITH CORONARY ANGIOGRAM;  Surgeon: Sinclair Grooms, MD;  Location: [facility] CATH LAB;  Service: Cardiovascular;  Laterality: N/A;    FAMHx:  Family History  Problem Relation Age of Onset  . Colon cancer Neg Hx   . Hypertension Sister   . Heart failure Sister   . Heart disease Sister   . Diabetes type II Sister   . Kidney disease Brother     hypertension  . Heart attack Mother 17  . Heart disease Mother   . Hypertension Mother   . Diabetes Mother   . Hypertension Child     SOCHx:   reports that she has never smoked. She has never used smokeless tobacco. She reports that she does not drink alcohol or use illicit  drugs.  ALLERGIES:  Allergies  Allergen Reactions  . Cephalosporins Anaphylaxis    Tongue swelling, gum pain  . Codeine Other (See Comments)    hallucinations  . Latex Itching  . Ciprofloxacin   . Penicillins Other (See Comments)    Childhood allergy but pt thinks she is no longer allergic  . Prednisone Other (See Comments)    Heart beating fast       ROS: A comprehensive review of systems was negative except for: Cardiovascular: positive for palpitations and chest wall pain  HOME MEDS: Current Outpatient Prescriptions  Medication Sig Dispense Refill  . albuterol (PROVENTIL HFA;VENTOLIN HFA) 108 (90 BASE) MCG/ACT inhaler Inhale 2 puffs into the lungs every 6 (six) hours as needed. Wheezing 3.7 g 5  . aspirin EC 81 MG tablet Take 81 mg by mouth daily.    . CRESTOR 20 MG tablet TAKE 1 TABLET (20 MG TOTAL) BY MOUTH DAILY. 30 tablet 5  . [employer] SENNA PLUS 8.6-50  MG per tablet Take 1 tablet by mouth at bedtime as needed (constipation).   5  . cycloSPORINE (RESTASIS) 0.05 % ophthalmic emulsion Place 1 drop into both eyes 2 (two) times daily. 0.4 mL 2  . diclofenac sodium (VOLTAREN) 1 % GEL Apply 2 g topically 2 (two) times daily as needed (joint pain).    . Fluocinolone Acetonide 0.01 % OIL Place 1 drop into both ears daily as needed (itchy ears). 20 mL 0  . fluticasone (FLONASE) 50 MCG/ACT nasal spray Place 1 spray into both nostrils daily as needed for allergies or rhinitis.    . furosemide (LASIX) 20 MG tablet Take 20 mg by mouth every other day.    . lisinopril (PRINIVIL,ZESTRIL) 2.5 MG tablet TAKE 1 TABLET (2.5 MG TOTAL) BY MOUTH DAILY. 30 tablet 8  . metoprolol tartrate (LOPRESSOR) 25 MG tablet Take 25 mg by mouth 2 (two) times daily.    Marland Kitchen NEXIUM 40 MG capsule TAKE ONE CAPSULE BY MOUTH EVERY DAY 30 capsule 1  . potassium chloride (K-DUR) 10 MEQ tablet Take 1 tablet (10 mEq total) by mouth every other day. 30 tablet 2   No current facility-administered medications for this visit.     LABS/IMAGING: No results found for this or any previous visit (from the past 48 hour(s)). No results found.  VITALS: BP 142/86 mmHg  Pulse 75  Ht 5\' 8"  (1.727 m)  Wt 314 lb 11.2 oz (142.747 kg)  BMI 47.86 kg/m2  LMP 01/13/2014  EXAM: General appearance: alert, no distress and morbidly obese Neck: no carotid bruit and no JVD Lungs: clear to auscultation bilaterally Heart: regular rate and rhythm, S1, S2 normal, no murmur, click, rub or gallop Abdomen: soft, non-tender; bowel sounds normal; no masses,  no organomegaly Extremities: edema trace pedal Pulses: 2+ and symmetric Skin: Skin color, texture, turgor normal. No rashes or lesions Neurologic: Grossly normal Psych: Mood, affect normal  EKG: Normal sinus rhythm at 75  ASSESSMENT: 1. Coronary artery disease status post 5 vessel CABG (2015) 2. Morbid obesity 3. Hypertension 4. Dyslipidemia 5. OSA, not on cPAP 6. Keloid scar 7. Palpitations  PLAN: 1.   Ms. Kamer is recovering from coronary artery bypass grafting nicely. She is interested in maintenance cardiac rehabilitation. I will refers her for phase 3 cardiac rehabilitation. She unfortunately did not complete phase II due to problems with vaginal bleeding that led to hysterectomy.  She recently had palpitations which was improved with adjustments in her medications. I think at this point she can go to Lasix as needed. Plan to see her back in 6 months or sooner as necessary.  [name], MD, [facility] Attending Cardiologist [facility]  HILTY,Kenneth C 11/28/2014, 4:43 PM

## 2014-12-03 ENCOUNTER — Telehealth: Payer: Self-pay | Admitting: Neurology

## 2014-12-03 ENCOUNTER — Ambulatory Visit: Payer: Self-pay | Admitting: Neurology

## 2014-12-03 NOTE — Telephone Encounter (Signed)
Pt resch appt to see Dr Posey Pronto on 01-08-15

## 2014-12-03 NOTE — Telephone Encounter (Signed)
Pt just called, she got a late start this morning and is running late. 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 Please advise.  FYI - today's appt is considered a no show but a letter will not be sent / Sherri S.

## 2014-12-03 NOTE — Telephone Encounter (Signed)
Please call pt to r/s NP appt / Sherri S.

## 2014-12-03 NOTE — Telephone Encounter (Signed)
OK to reschedule one more time.    Brocha Gilliam K. Posey Pronto, DO

## 2014-12-04 ENCOUNTER — Telehealth: Payer: Self-pay | Admitting: *Deleted

## 2014-12-04 NOTE — Telephone Encounter (Signed)
Faxed signed order for maintenance cardiac rehab to [facility] - h/o CABG

## 2014-12-06 ENCOUNTER — Encounter: Payer: Self-pay | Admitting: Neurology

## 2014-12-06 ENCOUNTER — Ambulatory Visit (INDEPENDENT_AMBULATORY_CARE_PROVIDER_SITE_OTHER): Payer: Medicare Other | Admitting: Neurology

## 2014-12-06 VITALS — BP 140/88 | HR 69 | Ht 68.0 in | Wt 313.4 lb

## 2014-12-06 DIAGNOSIS — R202 Paresthesia of skin: Secondary | ICD-10-CM

## 2014-12-06 DIAGNOSIS — I2510 Atherosclerotic heart disease of native coronary artery without angina pectoris: Secondary | ICD-10-CM

## 2014-12-06 DIAGNOSIS — G43109 Migraine with aura, not intractable, without status migrainosus: Secondary | ICD-10-CM

## 2014-12-06 NOTE — Patient Instructions (Addendum)
1.  For severe headaches, take Aleve with benadryl 25mg .  Limit taking rescue medication (tylenol, ibuprofen, aleve) to twice per week. 2.  If your headaches get worse, where you need to take more medication, please come back and see me as you may need a daily preventative medication 3.  Encouraged to stay active  4.  If symptoms worsen, please call my office to schedule electrodiagnostic (EMG) testing 5.  Return to clinic as needed

## 2014-12-06 NOTE — Progress Notes (Signed)
[facility] Clinic Note - Initial Visit   Date: 12/06/2014  [name] MRN: [id number] DOB: 1956-05-20   Dear Dr. Ellwood Dense:   Thank you for your kind referral of [name] for consultation of left sided paresthesias. Although her history is well known to you, please allow [place] to reiterate it for the purpose of our medical record. The patient was accompanied to the clinic by self.     History of Present Illness: [name] is a 59 y.o. right-handed African American female with hypertension, CAD s/p CABG (February 2015), obstructive sleep apnea, uterine cancer s/p hysterectomy (05/2014) and degenerative arthritis presenting for evaluation of left sided paresthesias.  She was admitted to [facility] from 11/7-11/06/2014 where she presented with 3-day history of migratory left sided numbness affecting her left upper extremity, left lower extremity, and right lower extremity. She underwent CABG in February and was concerned symptoms were cardiac related, so when to the hospital.  She had MRI brain and CT head which was unremarkable and she was told to see neurology for further evaluation.  She reports having long standing history of left sided numbness/tingling involving the face, arm and leg.  She occasionally will have associated headaches. Headaches started over the left parietal region, described as throbbing and sharp.  She stopped taking pain medication in November and noticed that her left sided paresthesias and headaches have resolved.  She has a low-grade dull headache which is too mild to take any medication.  She has long history of left leg numbness following left total knee replacement and has been using a cane for ambulation since then.  Out-side paper records, electronic medical record, and images have been reviewed where available and summarized as:  MRI brain wo contrast 09/29/2014: 1. No acute intracranial abnormality. 2. Stable  mild white matter disease, advanced for age. The finding is nonspecific but can be seen in the setting of chronic microvascular ischemia, a demyelinating process such as multiple sclerosis, vasculitis, complicated migraine headaches, or as the sequelae of a prior infectious or inflammatory process.  CT head 09/28/2014:  Negative US carotids 01/06/2014:  1-39% stenosis bilaterally  Labs 09/29/2014:  HIV NR, vitamin B12 621, ferritin 6, HbA1c 5.1   Past Medical History  Diagnosis Date  . Seasonal allergies   . Asthma     History of  . Hypertension   . Iron deficiency 12-07-2011  . Anemia   . GERD (gastroesophageal reflux disease)   . Heart murmur   . Osteoporosis   . PONV (postoperative nausea and vomiting)   . Dysrhythmia     occ palpitations   . Sleep apnea     no CPAP machine; sleep study 02/2010 REM AHI 61.7/hr, total sleep REM 14.8/hr  . Headache(784.0)     occasional   . Osteoarthritis   . Morbid obesity   . Acute urinary retention s/p Foley 01/07/2012  . Hemorrhoids, internal, with bleeding & prolapse 12/13/2011  . Myocardial infarction     unsure when; per cardiologist report  . Chronic headaches   . Coronary artery disease   . Anginal pain   . Dyslipidemia   . S/P CABG (coronary artery bypass graft)     x5 - LIMA to LAD, SVG to diagonal, SVG to OM1, SVG to OM2, SVG to PDA (Dr. Prescott Gum)  . Postmenopausal bleeding   . Neuropathy   . Blood dyscrasia     ABNORMAL VAGINAL BLEEDING  . Uterine cancer 7/15  clinical stage IA grade 1 endometrioid endometrial cancer    Past Surgical History  Procedure Laterality Date  . Knee surgery Bilateral 1999  . Total knee arthroplasty Left 04/29/2011  . Multiple tooth extractions    . Wisdom tooth extraction    . Colonoscopy  2009  . Knee arthroscopy  1991  . Transanal hemorrhoidal dearterliaization  01/06/12    with external hemorrhoid removal  . Coronary artery bypass graft N/A 01/09/2014    Procedure: CORONARY ARTERY BYPASS  GRAFTING (CABG) x 5 using left internal mammary artery and right leg greater saphenous vein harvested endoscopically;  Surgeon: Ivin Poot, MD;  Location: [place];  Service: Open Heart Surgery;  Laterality: N/A;  please use bed extenders and breast binder  . Intraoperative transesophageal echocardiogram N/A 01/09/2014    Procedure: INTRAOPERATIVE TRANSESOPHAGEAL ECHOCARDIOGRAM;  Surgeon: Ivin Poot, MD;  Location: [place];  Service: Open Heart Surgery;  Laterality: N/A;  . Tee without cardioversion  01/2010    EF 60-65%, small, flat, non-infiltrating, calcified, fixed apical/septal mass  . Nm myocar perf wall motion  01/2010    dipyridamole myoview - moderate perfusion defect in basal inferoseptal, basal inferior, mid inferoseptal, mid inferior, apical inferior region; EF 56%  . Cardiac catheterization    . Multiple extractions with alveoloplasty N/A 04/11/2014    Procedure: Extraction of tooth #'s 1,2,8,16 with alveoloplasty, maxillary tuberosity reductions, and gross debridement of remaining teeth.;  Surgeon: Lenn Cal, DDS;  Location: [place];  Service: Oral Surgery;  Laterality: N/A;  . Vaginal hysterectomy N/A 06/03/2014    Procedure: HYSTERECTOMY VAGINAL;  Surgeon: Lavonia Drafts, MD;  Location: [facility];  Service: Gynecology;  Laterality: N/A;  . Unilateral salpingectomy Right 06/03/2014    Procedure: UNILATERAL SALPINGECTOMY;  Surgeon: Lavonia Drafts, MD;  Location: [facility];  Service: Gynecology;  Laterality: Right;  . Left heart catheterization with coronary angiogram N/A 01/04/2014    Procedure: LEFT HEART CATHETERIZATION WITH CORONARY ANGIOGRAM;  Surgeon: Sinclair Grooms, MD;  Location: [facility] CATH LAB;  Service: Cardiovascular;  Laterality: N/A;     Medications:  Current Outpatient Prescriptions on File Prior to Visit  Medication Sig Dispense Refill  . albuterol (PROVENTIL HFA;VENTOLIN HFA) 108 (90 BASE) MCG/ACT inhaler Inhale 2 puffs into the lungs every 6 (six)  hours as needed. Wheezing 3.7 g 5  . aspirin EC 81 MG tablet Take 325 mg by mouth daily. 1/2 daily    . CRESTOR 20 MG tablet TAKE 1 TABLET (20 MG TOTAL) BY MOUTH DAILY. 30 tablet 5  . [employer]S SENNA PLUS 8.6-50 MG per tablet Take 1 tablet by mouth at bedtime as needed (constipation).   5  . cycloSPORINE (RESTASIS) 0.05 % ophthalmic emulsion Place 1 drop into both eyes 2 (two) times daily. 0.4 mL 2  . diclofenac sodium (VOLTAREN) 1 % GEL Apply 2 g topically 2 (two) times daily as needed (joint pain).    . Fluocinolone Acetonide 0.01 % OIL Place 1 drop into both ears daily as needed (itchy ears). 20 mL 0  . fluticasone (FLONASE) 50 MCG/ACT nasal spray Place 1 spray into both nostrils daily as needed for allergies or rhinitis.    . furosemide (LASIX) 20 MG tablet Take 20 mg by mouth every other day. PRN    . lisinopril (PRINIVIL,ZESTRIL) 2.5 MG tablet TAKE 1 TABLET (2.5 MG TOTAL) BY MOUTH DAILY. 30 tablet 8  . metoprolol tartrate (LOPRESSOR) 25 MG tablet Take 25 mg by mouth 2 (two) times daily.    Marland Kitchen  NEXIUM 40 MG capsule TAKE ONE CAPSULE BY MOUTH EVERY DAY 30 capsule 1   No current facility-administered medications on file prior to visit.    Allergies:  Allergies  Allergen Reactions  . Cephalosporins Anaphylaxis    Tongue swelling, gum pain  . Codeine Other (See Comments)    hallucinations  . Latex Itching  . Ciprofloxacin   . Penicillins Other (See Comments)    Childhood allergy but pt thinks she is no longer allergic  . Prednisone Other (See Comments)    Heart beating fast       Family History: Family History  Problem Relation Age of Onset  . Colon cancer Neg Hx   . Hypertension Sister   . Heart failure Sister   . Heart disease Sister   . Diabetes type II Sister   . Kidney disease Brother     hypertension  . Heart attack Mother 10  . Heart disease Mother   . Hypertension Mother   . Diabetes Mother   . Hypertension Child     Social History: History   Social History  .  Marital Status: Single    Spouse Name: N/A    Number of Children: 2  . Years of Education: N/A   Occupational History  . Retired    Social History Main Topics  . Smoking status: Never Smoker   . Smokeless tobacco: Never Used  . Alcohol Use: No  . Drug Use: No  . Sexual Activity: Not Currently   Other Topics Concern  . Not on file   Social History Narrative   Lives alone in a one story home.  Has 2 children.     Retired Regulatory affairs officer.     Education: 2 years of college.    Review of Systems:  CONSTITUTIONAL: No fevers, chills, night sweats, or weight loss.   EYES: No visual changes or eye pain ENT: No hearing changes.  No history of nose bleeds.   RESPIRATORY: No cough, wheezing and shortness of breath.   CARDIOVASCULAR: Negative for chest pain, and palpitations.   GI: Negative for abdominal discomfort, blood in stools or black stools.  No recent change in bowel habits.   GU:  No history of incontinence.   MUSCLOSKELETAL: No history of joint pain or swelling.  No myalgias.   SKIN: Negative for lesions, rash, and itching.   HEMATOLOGY/ONCOLOGY: Negative for prolonged bleeding, bruising easily, and swollen nodes.  No history of cancer.   ENDOCRINE: Negative for cold or heat intolerance, polydipsia or goiter.   PSYCH:  No depression or anxiety symptoms.   NEURO: As Above.   Vital Signs:  BP 140/88 mmHg  Pulse 69  Ht 5\' 8"  (1.727 m)  Wt 313 lb 6 oz (142.146 kg)  BMI 47.66 kg/m2  SpO2 97%  LMP 01/13/2014    Neurological Exam: MENTAL STATUS including orientation to time, place, person, recent and remote memory, attention span and concentration, language, and fund of knowledge is normal.  Speech is not dysarthric.  CRANIAL NERVES: II:  No visual field defects.  Unremarkable fundi.   III-IV-VI: Pupils equal round and reactive to light.  Normal conjugate, extra-ocular eye movements in all directions of gaze.  No nystagmus.  No ptosis.   V:  Normal facial sensation.     VII:   Normal facial symmetry and movements.   VIII:  Normal hearing and vestibular function.   IX-X:  Normal palatal movement.   XI:  Normal shoulder shrug and head rotation.  XII:  Normal tongue strength and range of motion, no deviation or fasciculation.  MOTOR:  No atrophy, fasciculations or abnormal movements.  No pronator drift.  Tone is normal.    Right Upper Extremity:    Left Upper Extremity:    Deltoid  5/5   Deltoid  5/5   Biceps  5/5   Biceps  5/5   Triceps  5/5   Triceps  5/5   Wrist extensors  5/5   Wrist extensors  5/5   Wrist flexors  5/5   Wrist flexors  5/5   Finger extensors  5/5   Finger extensors  5/5   Finger flexors  5/5   Finger flexors  5/5   Dorsal interossei  5/5   Dorsal interossei  5/5   Abductor pollicis  5/5   Abductor pollicis  5/5   Tone (Ashworth scale)  0  Tone (Ashworth scale)  0   Right Lower Extremity:    Left Lower Extremity:    Hip flexors  5/5   Hip flexors  5/5   Hip extensors  5/5   Hip extensors  5/5   Knee flexors  5/5   Knee flexors  5/5   Knee extensors  5/5   Knee extensors  5/5   Dorsiflexors  5/5   Dorsiflexors  5/5   Plantarflexors  5/5   Plantarflexors  5/5   Toe extensors  5/5   Toe extensors  5/5   Toe flexors  5/5   Toe flexors  5/5   Tone (Ashworth scale)  0  Tone (Ashworth scale)  0   MSRs:  Right                                                                 Left brachioradialis 2+  brachioradialis 2+  biceps 2+  biceps 2+  triceps 2+  triceps 2+  patellar 2+  patellar 2+  ankle jerk 0  ankle jerk 0  Hoffman no  Hoffman no  plantar response down  plantar response down   SENSORY:  Vibration absent at great toe bilaterally and temperature reduced over the feet.  Romberg's sign absent.   COORDINATION/GAIT: Normal finger-to- nose-finger.  Intact rapid alternating movements bilaterally. Gait is slow and wide-based due to body habitus.  Mild unsteadiness with tandem and stressed gait intact.    IMPRESSION: 1.  Migraine  equivalent syndrome manifesting with left sided paresthesias  - Clinically, doing much better since stopping her pain medication suggesting she also had medication overuse headaches  - Currently headaches are too mild to start preventative therapy, so recommend as needed medication  - Counseled on importance of limiting rescue medications to twice per week  2.  Left leg numbness, persistent following knee surgery  - Follow clinically as patient not interested in EMG at this time  3.  Early distal and symmetric polyneuropathy, idiopathic  - No active complaints of paresthesias  - HbA1c, TSH, and B12 within normal limits  4.  Morbid obesity   PLAN/RECOMMENDATIONS:  1.  For severe headaches, take tylenol with benadryl 25mg .  Limit taking rescue medication (tylenol, ibuprofen, aleve) to twice per week. 2.  If your headaches get worse, start preventative therapy 3.  Encouraged to stay active  4.  Consider electrodiagnostic testing if left sided paresthesias worsen 5.  Return to clinic as needed   The duration of this appointment visit was 40 minutes of face-to-face time with the patient.  Greater than 50% of this time was spent in counseling, explanation of diagnosis, planning of further management, and coordination of care.   Thank you for allowing me to participate in patient's care.  If I can answer any additional questions, I would be pleased to do so.    Sincerely,    Donika K. Posey Pronto, DO

## 2015-01-08 ENCOUNTER — Ambulatory Visit: Payer: Self-pay | Admitting: Neurology

## 2015-01-14 ENCOUNTER — Telehealth: Payer: Self-pay | Admitting: *Deleted

## 2015-01-14 ENCOUNTER — Telehealth: Payer: Self-pay | Admitting: Internal Medicine

## 2015-01-14 NOTE — Telephone Encounter (Signed)
ED or cards

## 2015-01-14 NOTE — Telephone Encounter (Signed)
Pt called back and she has placed a call to Cardiology. I told her if she does not hear from them to go to ED for evaluation.  She agrees.

## 2015-01-14 NOTE — Telephone Encounter (Signed)
Received patient call. Patient reported occasional palpitations in chest that resolved quickly (w/in several seconds). She has been having these for a few days. She also c/o CP that comes and goes, has been intermittently present 4-5 times a day over last 2-3 days, and resolves completely if she rubs her chest or changes position.  She denies dyspnea, fatigue, syncope, dizziness, or other pain. Denies cough or congestion. Does note "it could be a little anxiety." This AM she had this type of pain for a few minutes at a time.  Pt had CABG 1 year ago and OV on 11/28/14 by Dr. Debara Pickett.  Pt had called PCP office prior to [place] and has appt to see them this afternoon. She is also going to have BP checked across the street at the [facility].  I advised that if CP becomes worse and/or changes in character to seek ED eval. Will route to [facility] for addtl advice.

## 2015-01-14 NOTE — Telephone Encounter (Signed)
Pt called clinic with c/o mild chest pain and arrhythmia. She wanted to know if she should be seen. She states she was seen at doctors office, two weeks ago,  and BP was elevated.  She did have CABG X5 one year ago. She denies any nausea, diaphoresis, or SOB.  She has been feeling tired past few days.  Pt advised to call Cardiologist, Dr. Debara Pickett ( last seen 11/28/14) and if unable to be seen there please call us back. She agrees.

## 2015-01-15 ENCOUNTER — Ambulatory Visit (INDEPENDENT_AMBULATORY_CARE_PROVIDER_SITE_OTHER): Payer: Medicare Other | Admitting: Internal Medicine

## 2015-01-15 ENCOUNTER — Encounter: Payer: Self-pay | Admitting: Internal Medicine

## 2015-01-15 VITALS — BP 158/87 | HR 69 | Temp 98.0°F

## 2015-01-15 DIAGNOSIS — K219 Gastro-esophageal reflux disease without esophagitis: Secondary | ICD-10-CM | POA: Diagnosis not present

## 2015-01-15 DIAGNOSIS — I10 Essential (primary) hypertension: Secondary | ICD-10-CM | POA: Diagnosis not present

## 2015-01-15 DIAGNOSIS — I2510 Atherosclerotic heart disease of native coronary artery without angina pectoris: Secondary | ICD-10-CM

## 2015-01-15 DIAGNOSIS — M170 Bilateral primary osteoarthritis of knee: Secondary | ICD-10-CM

## 2015-01-15 MED ORDER — DEXLANSOPRAZOLE 30 MG PO CPDR: 30.0000 mg | DELAYED_RELEASE_CAPSULE | Freq: Every day | ORAL | Status: DC

## 2015-01-15 NOTE — Patient Instructions (Signed)
General Instructions: -Start taking Dexilant 30mg  daily for the heartburn. Stop taking Nexium.  -Please check your blood pressure at home at least once per day with your new blood pressure measuring device. Bring your device to clinic during your next visit as well as the BP log sheet.  -Follow up with [place] in 2 weeks.  -If you have chest pain please go to the ED for further evaluation.   Please bring your medicines with you each time you come to clinic.  Medicines may include prescription medications, over-the-counter medications, herbal remedies, eye drops, vitamins, or other pills.   Progress Toward Treatment Goals:  Treatment Goal 12/13/2013  Blood pressure unchanged    Self Care Goals & Plans:  Self Care Goal 11/07/2014  Manage my medications take my medicines as prescribed; bring my medications to every visit; refill my medications on time  Monitor my health keep track of my blood pressure  Eat healthy foods eat more vegetables; eat foods that are low in salt; eat baked foods instead of fried foods  Be physically active find an activity I enjoy  Meeting treatment goals -    No flowsheet data found.   Care Management & Community Referrals:  Referral 11/06/2013  Referrals made for care management support none needed

## 2015-01-15 NOTE — Assessment & Plan Note (Addendum)
No s/s of infection on exam. She tells me she will call her Orthopedic Surgeon to make a follow up appointment for her bilateral knee pain.

## 2015-01-15 NOTE — Progress Notes (Signed)
   Subjective:    Patient ID: [name], female    DOB: 05/23/1956, 59 y.o.   MRN: [id number]  HPI [name] is a 59 yr old woman with PMH of CAD s/p CABG x5 1 yr ago, HTN, obesity, presenting for routine blood pressure check up.  Of note, she had called the clinic yesterday morning stating that she had chest pain. She called her Cardiologist as well and both the [facility] attending (Dr. Lynnae January) and Dr. Debara Pickett (her Cardiologist) advised her to go to the ED for evaluation of her chest pain.  During this visit she denies having chest pain currently or this morning. She states she is having GERD like symptoms. I advised her multiple times to go to the ED if she is experiencing chest pain but she again denies having chest pain.    She reports having elevated BP to 150/100 at a [place] and wanted to come in to have her BP rechecked.  She also complains of GERD symptoms and wants to be back on Dexilant which she was taking 1 yr ago, states that Nexium is not working for her.       Review of Systems  Constitutional: Negative for fever, chills, activity change, appetite change and fatigue.  Respiratory: Negative for cough and shortness of breath.   Cardiovascular: Negative for chest pain, palpitations and leg swelling.  Gastrointestinal: Negative for abdominal pain.       Heartburn  Musculoskeletal: Positive for gait problem. Negative for back pain.       Left knee chronic pain  Skin: Negative for rash and wound.  Neurological: Negative for dizziness and light-headedness.  Psychiatric/Behavioral: Negative for agitation.       Objective:   Physical Exam  Constitutional: She is oriented to person, place, and time. She appears well-developed and well-nourished. No distress.  Cardiovascular: Normal rate and regular rhythm.   Pulmonary/Chest: Effort normal and breath sounds normal. No respiratory distress. She has no wheezes. She has no rales.  Midline chest surgical incision with hypertrophic  scar s/p CABG with no edema/erythema  Abdominal: Soft. There is no tenderness.  Musculoskeletal: She exhibits tenderness. She exhibits no edema.  TTP to deep palpation of left knee with mild effusion but not increased warmth or erythema, crepitus with flexion/extension of knee.   Neurological: She is alert and oriented to person, place, and time. Coordination abnormal.  Uses a cane to stabilize her gait  Skin: Skin is warm and dry. She is not diaphoretic.  Psychiatric: She has a normal mood and affect.  Nursing note and vitals reviewed.         Assessment & Plan:

## 2015-01-15 NOTE — Assessment & Plan Note (Signed)
BP Readings from Last 3 Encounters:  01/15/15 158/87  12/06/14 140/88  11/28/14 142/86    Lab Results  Component Value Date   NA 136 11/07/2014   K 4.1 11/07/2014   CREATININE 0.81 11/07/2014    Assessment: Blood pressure control:  not controlled Progress toward BP goal:   not at goal Comments: She is on lisinopril 2.5mg  daily, lopressor 25mg  BID.   Plan: Medications:  continue current medications Educational resources provided:   Self management tools provided:   Other plans: She is asked to use a blood pressure monitor at home and bring it with her to her next appointment. She was instructed to check her BP at least once daily after 15 minutes of rest and to bring her log sheet with her in 2 weeks for a BP recheck visit.

## 2015-01-15 NOTE — Assessment & Plan Note (Signed)
Per her preference, will discontinue Nexium and start Dexilant

## 2015-01-16 NOTE — Progress Notes (Signed)
Internal Medicine Clinic Attending  Case discussed with Dr. Kennerly soon after the resident saw the patient.  We reviewed the resident's history and exam and pertinent patient test results.  I agree with the assessment, diagnosis, and plan of care documented in the resident's note.  

## 2015-01-18 NOTE — Telephone Encounter (Signed)
Does not sound like pain is do to her heart. Probable chest wall or noncardiac

## 2015-01-22 ENCOUNTER — Telehealth: Payer: Self-pay | Admitting: Internal Medicine

## 2015-01-22 DIAGNOSIS — D509 Iron deficiency anemia, unspecified: Secondary | ICD-10-CM

## 2015-01-22 MED ORDER — FERROUS SULFATE 325 (65 FE) MG PO TABS: 325.0000 mg | ORAL_TABLET | Freq: Three times a day (TID) | ORAL | Status: DC

## 2015-01-22 NOTE — Telephone Encounter (Signed)
I put in for an iron panel and was planning to have her come in for lab draw. She was last seen by Dr. Hayes Ludwig last week on 2/24. If that's okay with you, I'm also routing this message to the front desk so that they can call her about her lab draw.

## 2015-01-22 NOTE — Telephone Encounter (Addendum)
Thank you Dr Posey Pronto for looking at this.  The patient probably had these labs because she had been having DUB due to a superficially invasive endometrioid carcinoma, and had a definitive treatment of vaginal hysterectomy on 06/03/14. Since then, her hemoglobin has been improving.   Lab Results  Component Value Date   HGB 10.9* 09/28/2014   HGB 9.7* 07/22/2014   HGB 9.3* 06/04/2014   HGB 7.7* 06/03/2014   HGB 8.6* 06/03/2014   Though iron supplements benign, I would still repeat the Fe panel while putting her on a supplement - just to review if she has improved any given the pathology has ceased. I will repeat this next visit with me/ or would request the resident who sees her before I do to do so.   Thanks, Madilyn Fireman MD MPH 01/22/2015 12:11 PM

## 2015-01-22 NOTE — Addendum Note (Signed)
Addended by: Riccardo Dubin on: 01/22/2015 08:11 PM   Modules accepted: Orders

## 2015-01-22 NOTE — Telephone Encounter (Signed)
In reviewing lab work, I think there is a reason as to why she noted fatigue at her last clinic visit regarding some lab work that was done at her most recent hospitalization during which I helped oversee her care.   Iron/TIBC/Ferritin/ %Sat    Component Value Date/Time   IRON 24* 09/29/2014 0350   TIBC 399 09/29/2014 0350   FERRITIN 6* 09/29/2014 0350   IRONPCTSAT 6* 09/29/2014 0350   Please advise her to start iron supplementation.

## 2015-01-22 NOTE — Telephone Encounter (Signed)
Message left on home phone ID recording about Dr Posey Pronto message and med is at pharmacy.

## 2015-01-27 NOTE — Telephone Encounter (Signed)
Sounds good, thanks.

## 2015-01-28 ENCOUNTER — Other Ambulatory Visit: Payer: Self-pay

## 2015-01-30 ENCOUNTER — Encounter: Payer: Self-pay | Admitting: Internal Medicine

## 2015-01-30 ENCOUNTER — Ambulatory Visit (INDEPENDENT_AMBULATORY_CARE_PROVIDER_SITE_OTHER): Payer: Medicare Other | Admitting: Internal Medicine

## 2015-01-30 VITALS — BP 165/78 | HR 73 | Temp 97.9°F | Ht 68.0 in | Wt 322.5 lb

## 2015-01-30 DIAGNOSIS — D509 Iron deficiency anemia, unspecified: Secondary | ICD-10-CM

## 2015-01-30 DIAGNOSIS — I10 Essential (primary) hypertension: Secondary | ICD-10-CM

## 2015-01-30 LAB — CBC
HEMATOCRIT: 36.5 % (ref 36.0–46.0)
Hemoglobin: 11.5 g/dL — ABNORMAL LOW (ref 12.0–15.0)
MCH: 25.3 pg — ABNORMAL LOW (ref 26.0–34.0)
MCHC: 31.5 g/dL (ref 30.0–36.0)
MCV: 80.4 fL (ref 78.0–100.0)
MPV: 8.4 fL — ABNORMAL LOW (ref 8.6–12.4)
PLATELETS: 266 10*3/uL (ref 150–400)
RBC: 4.54 MIL/uL (ref 3.87–5.11)
RDW: 19.1 % — ABNORMAL HIGH (ref 11.5–15.5)
WBC: 6.7 10*3/uL (ref 4.0–10.5)

## 2015-01-30 LAB — IRON AND TIBC
%SAT: 10 % — AB (ref 20–55)
IRON: 39 ug/dL — AB (ref 42–145)
TIBC: 394 ug/dL (ref 250–470)
UIBC: 355 ug/dL (ref 125–400)

## 2015-01-30 MED ORDER — LISINOPRIL 5 MG PO TABS: 5.0000 mg | ORAL_TABLET | Freq: Every day | ORAL | Status: DC

## 2015-01-30 NOTE — Assessment & Plan Note (Signed)
BP Readings from Last 3 Encounters:  01/30/15 165/78  01/15/15 158/87  12/06/14 140/88    Lab Results  Component Value Date   NA 136 11/07/2014   K 4.1 11/07/2014   CREATININE 0.81 11/07/2014    Assessment: Blood pressure control:  elevated Progress toward BP goal:   not at goal Comments: she is on lisinopril 2.5 mg daily and metoprolol 25mg  BID with Lasix 40mg  daily occasionally for LE edema  Plan: Medications:  increase lisinopril to 5mg  daily, may need to be up to 10mg  daily but has had dizziness in the past (thought to be due to her anemia at that time) Educational resources provided: brochure, handout, video Self management tools provided:   Other plans: Follow up in 2-4 weeks. She will continue checking her BP at home.

## 2015-01-30 NOTE — Patient Instructions (Signed)
General Instructions: -Start taking lisinopril 5mg  daily. This if for your blood pressure.  -Continue measuring your blood pressure at home and write it down, bring it with you to your next visit.  -Follow up with [place] in 2-4 weeks or sooner as needed.    Thank you for bringing your medicines today. This helps [place] keep you safe from mistakes.   Progress Toward Treatment Goals:  Treatment Goal 12/13/2013  Blood pressure unchanged    Self Care Goals & Plans:  Self Care Goal 01/30/2015  Manage my medications take my medicines as prescribed; bring my medications to every visit; refill my medications on time; follow the sick day instructions if I am sick  Monitor my health keep track of my blood pressure; keep track of my weight  Eat healthy foods eat more vegetables; eat fruit for snacks and desserts; eat foods that are low in salt; eat smaller portions  Be physically active find an activity I enjoy  Meeting treatment goals -    No flowsheet data found.   Care Management & Community Referrals:  Referral 11/06/2013  Referrals made for care management support none needed

## 2015-01-30 NOTE — Progress Notes (Signed)
Case discussed with Dr. Kennerly at the time of the visit.  We reviewed the resident's history and exam and pertinent patient test results.  I agree with the assessment, diagnosis, and plan of care documented in the resident's note. 

## 2015-01-30 NOTE — Assessment & Plan Note (Addendum)
She has had no more uterine bleeding.  Was recently started on iron supplementation (01/23/15).  Per discussion with attending, Dr. Ellwood Dense, will check CBC, ron and TIBC level today (last checked in Nov 2015).  -Will need iron study in 6-8 weeks to evaluate for need of iron supplementation.  -Continue iron supplementation for now

## 2015-01-30 NOTE — Progress Notes (Signed)
   Subjective:    Patient ID: [name], female    DOB: 1956-10-23, 59 y.o.   MRN: [id number]  HPI [name] is a 59 yr old woman with PMH of CAD s/p CABG x5 vessel 1 yr ago, HTN, obesity, presenting for blood pressure recheck.  She has had a headache that is improving and attributed to sinus/allergies.  Her bilateral knee pain has improved since her last visit and she did not need to see her Orthopedic Surgeon.  She received her new wrist blood pressure measuring device in the mail 2 days ago and has used it twice prior to this visit. Her BP has remained elevated with BP in the 160s.    Review of Systems  Constitutional: Negative for fever and chills.  HENT: Negative for congestion.   Respiratory: Negative for cough.   Musculoskeletal: Negative for arthralgias.  Neurological: Positive for headaches. Negative for dizziness and light-headedness.  Psychiatric/Behavioral: Negative for agitation.       Objective:   Physical Exam  Constitutional: She is oriented to person, place, and time. She appears well-developed and well-nourished. No distress.  Cardiovascular: Normal rate.   Pulmonary/Chest: Effort normal. No respiratory distress.  Neurological: She is alert and oriented to person, place, and time. Coordination abnormal.  Uses a cane to stabilize her gait  Skin: Skin is warm and dry. She is not diaphoretic.  Psychiatric: She has a normal mood and affect.  Nursing note and vitals reviewed.         Assessment & Plan:

## 2015-02-03 ENCOUNTER — Other Ambulatory Visit: Payer: Self-pay | Admitting: Internal Medicine

## 2015-02-24 ENCOUNTER — Encounter (HOSPITAL_COMMUNITY): Payer: Medicare Other

## 2015-02-24 DIAGNOSIS — Z951 Presence of aortocoronary bypass graft: Secondary | ICD-10-CM | POA: Insufficient documentation

## 2015-02-26 ENCOUNTER — Encounter (HOSPITAL_COMMUNITY): Payer: Medicare Other

## 2015-02-27 ENCOUNTER — Ambulatory Visit (INDEPENDENT_AMBULATORY_CARE_PROVIDER_SITE_OTHER): Payer: Medicare Other | Admitting: Internal Medicine

## 2015-02-27 ENCOUNTER — Encounter: Payer: Self-pay | Admitting: Internal Medicine

## 2015-02-27 VITALS — BP 144/74 | HR 59 | Temp 98.0°F | Ht 68.0 in | Wt 325.2 lb

## 2015-02-27 DIAGNOSIS — H6123 Impacted cerumen, bilateral: Secondary | ICD-10-CM

## 2015-02-27 DIAGNOSIS — H6120 Impacted cerumen, unspecified ear: Secondary | ICD-10-CM | POA: Insufficient documentation

## 2015-02-27 DIAGNOSIS — I10 Essential (primary) hypertension: Secondary | ICD-10-CM

## 2015-02-27 LAB — BASIC METABOLIC PANEL WITH GFR
BUN: 13 mg/dL (ref 6–23)
CO2: 28 meq/L (ref 19–32)
Calcium: 9.1 mg/dL (ref 8.4–10.5)
Chloride: 100 mEq/L (ref 96–112)
Creat: 0.74 mg/dL (ref 0.50–1.10)
GFR, Est Non African American: 88 mL/min
GLUCOSE: 88 mg/dL (ref 70–99)
POTASSIUM: 4.2 meq/L (ref 3.5–5.3)
Sodium: 139 mEq/L (ref 135–145)

## 2015-02-27 NOTE — Assessment & Plan Note (Addendum)
She says her HHRN looked in her ears and told her she had a lot of was.  She was rx fluocinolone acetonide otic drops at one point and she says she does not use it regularly but started using it again three days ago to help with the wax.  This med is actually intended for eczematous external otis.  She is not experiencing hear symptoms or hearing loss. - she was advised to d/c drop after tomorrow (to avoid ADRs of prolonged steroid use) - I have asked her to return to clinic next week for ear wax removal, if was difficult to remove, would try Debrox for a few days then repeat attempt at removal.

## 2015-02-27 NOTE — Assessment & Plan Note (Addendum)
BP Readings from Last 3 Encounters:  02/27/15 144/74  01/30/15 165/78  01/15/15 158/87    Lab Results  Component Value Date   NA 136 11/07/2014   K 4.1 11/07/2014   CREATININE 0.81 11/07/2014    Assessment: Blood pressure control:  slightly elevated Progress toward BP goal:   improving Comments: She is compliant with medications and she has joined the aquatic center for exercise.  Plan: Medications:  continue current medications:  Lisinopril 5mg  daily, metoprolol 25mg  BID, Lasix 20mg  prn  Educational resources provided: brochure (denies) Self management tools provided: home blood pressure logbook Other plans: BMP today.  If Cr still good will increase lisinopril to 10mg  daily for tighter BP control given her cardiac hx and relatively young age.  If significant increase in Cr, will need alternative BP agent.  RTC in 1 month for follow-up.

## 2015-02-27 NOTE — Patient Instructions (Signed)
1. I am getting labs today to check your kidney function.  I will call you regarding increasing your blood pressure medications after I get your labs back.   2. Please take all medications as prescribed.  Try taking iron with food to ease stomach upset.   3. If you have worsening of your symptoms or new symptoms arise, please call the clinic ([phone]), or go to the ER immediately if symptoms are severe.

## 2015-02-27 NOTE — Progress Notes (Signed)
Subjective:    Patient ID: [name], female    DOB: 05-18-1956, 59 y.o.   MRN: [id number]  HPI Comments: [name] is a 59 year old with PMH as below here for follow-up of BP.  Please see problem based assessment and plan for details.     Past Medical History  Diagnosis Date  . Seasonal allergies   . Asthma     History of  . Hypertension   . Iron deficiency 12-07-2011  . Anemia   . GERD (gastroesophageal reflux disease)   . Heart murmur   . Osteoporosis   . PONV (postoperative nausea and vomiting)   . Dysrhythmia     occ palpitations   . Sleep apnea     no CPAP machine; sleep study 02/2010 REM AHI 61.7/hr, total sleep REM 14.8/hr  . Headache(784.0)     occasional   . Osteoarthritis   . Morbid obesity   . Acute urinary retention s/p Foley 01/07/2012  . Hemorrhoids, internal, with bleeding & prolapse 12/13/2011  . Myocardial infarction     unsure when; per cardiologist report  . Chronic headaches   . Coronary artery disease   . Anginal pain   . Dyslipidemia   . S/P CABG (coronary artery bypass graft)     x5 - LIMA to LAD, SVG to diagonal, SVG to OM1, SVG to OM2, SVG to PDA (Dr. Prescott Gum)  . Postmenopausal bleeding   . Neuropathy   . Blood dyscrasia     ABNORMAL VAGINAL BLEEDING  . Uterine cancer 7/15    clinical stage IA grade 1 endometrioid endometrial cancer    Review of Systems  Constitutional: Positive for chills and appetite change. Negative for fever.       Low grade fever of 100F this past weekend in the setting of URI symptoms.   HENT: Negative for ear discharge, ear pain, postnasal drip, rhinorrhea and sneezing.        Sore throat, rhinorrhea and sneezing this weekend have resolved.  Eyes: Negative for visual disturbance.  Respiratory: Negative for cough and shortness of breath.   Cardiovascular: Positive for leg swelling. Negative for chest pain and palpitations.       Chronic left leg swelling.  No pain.  Gastrointestinal: Positive for  constipation. Negative for nausea, vomiting, diarrhea and blood in stool.       Chronic constipation responds to metamucil.  Genitourinary: Negative for dysuria and hematuria.       Filed Vitals:   02/27/15 1059  BP: 152/67  Pulse: 61  Temp: 98 F (36.7 C)  TempSrc: Oral  Height: 5\' 8"  (1.727 m)  Weight: 325 lb 3.2 oz (147.51 kg)  SpO2: 100%    Objective:   Physical Exam  Constitutional: She is oriented to person, place, and time. She appears well-developed. No distress.  HENT:  Head: Normocephalic and atraumatic.  Mouth/Throat: Oropharynx is clear and moist. No oropharyngeal exudate.  Sinuses are non-tender.  There is significant wax in both ears which obscures the TMs.  She has no mastoid, tragus or ear pain.  Eyes: EOM are normal. Pupils are equal, round, and reactive to light.  Neck: Neck supple.  Cardiovascular: Normal rate, regular rhythm and normal heart sounds.  Exam reveals no gallop and no friction rub.   No murmur heard. Pulmonary/Chest: Effort normal and breath sounds normal. No respiratory distress. She has no wheezes. She has no rales.  Abdominal: Soft. Bowel sounds are normal. She exhibits no distension and  no mass. There is no tenderness. There is no rebound and no guarding.  Musculoskeletal: Normal range of motion. She exhibits edema. She exhibits no tenderness.  Trace pretibial edema B/L, left leg appears slightly bigger than right which she says is chronic since her surgery.    Lymphadenopathy:    She has no cervical adenopathy.  Neurological: She is alert and oriented to person, place, and time. No cranial nerve deficit.  Skin: Skin is warm. She is not diaphoretic.  Psychiatric: She has a normal mood and affect. Her behavior is normal. Judgment and thought content normal.  Vitals reviewed.         Assessment & Plan:  Please see problem based assessment and plan.

## 2015-02-27 NOTE — Progress Notes (Signed)
Internal Medicine Clinic Attending  Case discussed with Dr. Wilson soon after the resident saw the patient.  We reviewed the resident's history and exam and pertinent patient test results.  I agree with the assessment, diagnosis, and plan of care documented in the resident's note.  

## 2015-02-28 ENCOUNTER — Encounter (HOSPITAL_COMMUNITY)
Admission: RE | Admit: 2015-02-28 | Discharge: 2015-02-28 | Disposition: A | Discharge status: Home / Self Care | Payer: Medicare Other | Source: Ambulatory Visit | Attending: Internal Medicine | Admitting: Internal Medicine

## 2015-03-03 ENCOUNTER — Encounter (HOSPITAL_COMMUNITY)
Admission: RE | Admit: 2015-03-03 | Discharge: 2015-03-03 | Disposition: A | Discharge status: Home / Self Care | Payer: Medicare Other | Source: Ambulatory Visit | Attending: Internal Medicine | Admitting: Internal Medicine

## 2015-03-03 DIAGNOSIS — Z951 Presence of aortocoronary bypass graft: Secondary | ICD-10-CM | POA: Diagnosis not present

## 2015-03-04 ENCOUNTER — Encounter: Payer: Self-pay | Admitting: Internal Medicine

## 2015-03-05 ENCOUNTER — Encounter (HOSPITAL_COMMUNITY): Admission: RE | Admit: 2015-03-05 | Payer: Medicare Other | Source: Ambulatory Visit

## 2015-03-05 ENCOUNTER — Other Ambulatory Visit: Payer: Self-pay | Admitting: Internal Medicine

## 2015-03-05 ENCOUNTER — Telehealth: Payer: Self-pay | Admitting: Internal Medicine

## 2015-03-05 DIAGNOSIS — I10 Essential (primary) hypertension: Secondary | ICD-10-CM

## 2015-03-05 MED ORDER — LISINOPRIL 10 MG PO TABS: 10.0000 mg | ORAL_TABLET | Freq: Every day | ORAL | Status: DC

## 2015-03-05 NOTE — Telephone Encounter (Signed)
I was unable to reach patient by phone so I left a message on her voicemail to call [place].  I wanted to inform her that labs were normal and that I have sent in a new rx for increased dose of lisinopril (10mg  daily instead of 5mg  daily) for better blood pressure control.

## 2015-03-06 ENCOUNTER — Ambulatory Visit (INDEPENDENT_AMBULATORY_CARE_PROVIDER_SITE_OTHER): Payer: Medicare Other | Admitting: Internal Medicine

## 2015-03-06 ENCOUNTER — Encounter: Payer: Self-pay | Admitting: Internal Medicine

## 2015-03-06 VITALS — BP 126/65 | HR 74 | Temp 98.2°F | Wt 329.8 lb

## 2015-03-06 DIAGNOSIS — H6123 Impacted cerumen, bilateral: Secondary | ICD-10-CM

## 2015-03-07 ENCOUNTER — Encounter (HOSPITAL_COMMUNITY): Payer: Medicare Other

## 2015-03-10 ENCOUNTER — Encounter (HOSPITAL_COMMUNITY)
Admission: RE | Admit: 2015-03-10 | Discharge: 2015-03-10 | Disposition: A | Discharge status: Home / Self Care | Payer: Medicare Other | Source: Ambulatory Visit | Attending: Internal Medicine | Admitting: Internal Medicine

## 2015-03-10 DIAGNOSIS — Z951 Presence of aortocoronary bypass graft: Secondary | ICD-10-CM | POA: Diagnosis not present

## 2015-03-10 LAB — GLUCOSE, CAPILLARY: Glucose-Capillary: 104 mg/dL — ABNORMAL HIGH (ref 70–99)

## 2015-03-10 NOTE — Progress Notes (Signed)
Patient came here for ear wax removal.  It was performed for both ears by RN Ulis Rias.  I supervised the procedure.  Patient denotes no other complaints at present time.   Madilyn Fireman MD MPH 03/10/2015 2:47 PM

## 2015-03-11 ENCOUNTER — Ambulatory Visit
Admission: RE | Admit: 2015-03-11 | Discharge: 2015-03-11 | Disposition: A | Discharge status: Home / Self Care | Payer: Medicare Other | Source: Ambulatory Visit

## 2015-03-11 ENCOUNTER — Other Ambulatory Visit: Payer: Self-pay

## 2015-03-11 DIAGNOSIS — Z1231 Encounter for screening mammogram for malignant neoplasm of breast: Secondary | ICD-10-CM

## 2015-03-12 ENCOUNTER — Encounter (HOSPITAL_COMMUNITY)
Admission: RE | Admit: 2015-03-12 | Discharge: 2015-03-12 | Disposition: A | Discharge status: Home / Self Care | Payer: Medicare Other | Source: Ambulatory Visit | Attending: Internal Medicine | Admitting: Internal Medicine

## 2015-03-14 ENCOUNTER — Encounter (HOSPITAL_COMMUNITY)
Admission: RE | Admit: 2015-03-14 | Discharge: 2015-03-14 | Disposition: A | Discharge status: Home / Self Care | Payer: Medicare Other | Source: Ambulatory Visit | Attending: Internal Medicine | Admitting: Internal Medicine

## 2015-03-17 ENCOUNTER — Encounter (HOSPITAL_COMMUNITY)
Admission: RE | Admit: 2015-03-17 | Discharge: 2015-03-17 | Disposition: A | Discharge status: Home / Self Care | Payer: Medicare Other | Source: Ambulatory Visit | Attending: Internal Medicine | Admitting: Internal Medicine

## 2015-03-17 NOTE — Progress Notes (Signed)
[name] arrived late to her cardiac rehab exercise session. She stated she was "rushing" this am. Her arrival BP was 142/100. She took her morning BP medication later than usual. After a 15 min rest, her BP remained elevated at 148/98. It was decided that [name] would not exercise and return home to rest and relax. [name] was strongly encouraged to arrive on time for her exercise sessions and to take her medications as close to awakening as possible. She was also encouraged to give herself enough time in the mornings so she wouldn't feel rushed which she admits contributed to her elevated pressure. [name] has a history of hypertension with her diastolic pressures 80 to 445. She was discharged home stable, denying complaints.

## 2015-03-19 ENCOUNTER — Encounter (HOSPITAL_COMMUNITY)
Admission: RE | Admit: 2015-03-19 | Discharge: 2015-03-19 | Disposition: A | Discharge status: Home / Self Care | Payer: Medicare Other | Source: Ambulatory Visit | Attending: Internal Medicine | Admitting: Internal Medicine

## 2015-03-19 ENCOUNTER — Telehealth: Payer: Self-pay | Admitting: Internal Medicine

## 2015-03-19 NOTE — Telephone Encounter (Signed)
Late entry, spoke to [facility] at cardiac rehab   Joann wanted to report ,patient is in the maintenance program  Last visit was stopped due to patient's blood pressure 142/100. Today's blood pressure 157 /86 after her first session it  Increase to 179/100. Patient has a headache today and last evening left shoulder discomfort last night. Attempted to schedule appointment with extender earliest 5 /20/ 16.appointment was not taken by patient.  Di Kindle is sending report of today's exercise and blood pressure for Dr Debara Pickett to review. RN has placed information in Doctor's box, Eliezer Lofts is also aware.

## 2015-03-19 NOTE — Progress Notes (Addendum)
Pt in today for cardiac rehab maintenance. Pt BP:  157/86.  After 10 minutes of exercise, BP:  179/100.  Pt c/o dull headache for past few days.  Pt also reports episode of left shoulder discomfort last night at rest. This episode relieved within 20-30 minutes.  Spoke to [employer], Dr. Lysbeth Penner nurse.  No work in appointments available. Vital sign flow sheets faxed to office for Dr. Debara Pickett to review. No change to current regimen. Pt will plan to stay out of cardiac rehab until call back from Dr. Lysbeth Penner office.  Pt verbalized understanding.

## 2015-03-21 ENCOUNTER — Emergency Department (HOSPITAL_COMMUNITY): Payer: Medicare Other

## 2015-03-21 ENCOUNTER — Encounter (HOSPITAL_COMMUNITY): Payer: Self-pay

## 2015-03-21 ENCOUNTER — Encounter (HOSPITAL_COMMUNITY): Payer: Medicare Other

## 2015-03-21 ENCOUNTER — Emergency Department (HOSPITAL_COMMUNITY)
Admission: EM | Admit: 2015-03-21 | Discharge: 2015-03-22 | Disposition: A | Discharge status: Home / Self Care | Payer: Medicare Other | Attending: Emergency Medicine | Admitting: Emergency Medicine

## 2015-03-21 DIAGNOSIS — Z79899 Other long term (current) drug therapy: Secondary | ICD-10-CM | POA: Insufficient documentation

## 2015-03-21 DIAGNOSIS — I25119 Atherosclerotic heart disease of native coronary artery with unspecified angina pectoris: Secondary | ICD-10-CM | POA: Diagnosis not present

## 2015-03-21 DIAGNOSIS — J45909 Unspecified asthma, uncomplicated: Secondary | ICD-10-CM | POA: Insufficient documentation

## 2015-03-21 DIAGNOSIS — Z91040 Latex allergy status: Secondary | ICD-10-CM | POA: Diagnosis not present

## 2015-03-21 DIAGNOSIS — Z96652 Presence of left artificial knee joint: Secondary | ICD-10-CM | POA: Diagnosis not present

## 2015-03-21 DIAGNOSIS — M1990 Unspecified osteoarthritis, unspecified site: Secondary | ICD-10-CM | POA: Diagnosis not present

## 2015-03-21 DIAGNOSIS — Z7982 Long term (current) use of aspirin: Secondary | ICD-10-CM | POA: Insufficient documentation

## 2015-03-21 DIAGNOSIS — I252 Old myocardial infarction: Secondary | ICD-10-CM | POA: Insufficient documentation

## 2015-03-21 DIAGNOSIS — Z8669 Personal history of other diseases of the nervous system and sense organs: Secondary | ICD-10-CM | POA: Insufficient documentation

## 2015-03-21 DIAGNOSIS — Z880 Allergy status to penicillin: Secondary | ICD-10-CM | POA: Insufficient documentation

## 2015-03-21 DIAGNOSIS — E785 Hyperlipidemia, unspecified: Secondary | ICD-10-CM | POA: Insufficient documentation

## 2015-03-21 DIAGNOSIS — R011 Cardiac murmur, unspecified: Secondary | ICD-10-CM | POA: Insufficient documentation

## 2015-03-21 DIAGNOSIS — Z8742 Personal history of other diseases of the female genital tract: Secondary | ICD-10-CM | POA: Diagnosis not present

## 2015-03-21 DIAGNOSIS — Z9889 Other specified postprocedural states: Secondary | ICD-10-CM | POA: Insufficient documentation

## 2015-03-21 DIAGNOSIS — M25561 Pain in right knee: Secondary | ICD-10-CM | POA: Insufficient documentation

## 2015-03-21 DIAGNOSIS — E611 Iron deficiency: Secondary | ICD-10-CM | POA: Diagnosis not present

## 2015-03-21 DIAGNOSIS — Z8542 Personal history of malignant neoplasm of other parts of uterus: Secondary | ICD-10-CM | POA: Diagnosis not present

## 2015-03-21 DIAGNOSIS — I10 Essential (primary) hypertension: Secondary | ICD-10-CM | POA: Diagnosis not present

## 2015-03-21 DIAGNOSIS — Z8719 Personal history of other diseases of the digestive system: Secondary | ICD-10-CM | POA: Insufficient documentation

## 2015-03-21 DIAGNOSIS — R0789 Other chest pain: Secondary | ICD-10-CM | POA: Diagnosis not present

## 2015-03-21 DIAGNOSIS — D649 Anemia, unspecified: Secondary | ICD-10-CM | POA: Insufficient documentation

## 2015-03-21 DIAGNOSIS — Z951 Presence of aortocoronary bypass graft: Secondary | ICD-10-CM | POA: Diagnosis not present

## 2015-03-21 LAB — CBC
HEMATOCRIT: 37.9 % (ref 36.0–46.0)
Hemoglobin: 12.7 g/dL (ref 12.0–15.0)
MCH: 26.1 pg (ref 26.0–34.0)
MCHC: 33.5 g/dL (ref 30.0–36.0)
MCV: 77.8 fL — ABNORMAL LOW (ref 78.0–100.0)
Platelets: 229 10*3/uL (ref 150–400)
RBC: 4.87 MIL/uL (ref 3.87–5.11)
RDW: 17.8 % — ABNORMAL HIGH (ref 11.5–15.5)
WBC: 7.2 10*3/uL (ref 4.0–10.5)

## 2015-03-21 LAB — BASIC METABOLIC PANEL
ANION GAP: 8 (ref 5–15)
BUN: 9 mg/dL (ref 6–23)
CO2: 27 mmol/L (ref 19–32)
CREATININE: 0.78 mg/dL (ref 0.50–1.10)
Calcium: 9.4 mg/dL (ref 8.4–10.5)
Chloride: 103 mmol/L (ref 96–112)
GFR calc Af Amer: >90 mL/min (ref >90)
GFR calc non Af Amer: 89 mL/min — ABNORMAL LOW (ref >90)
GLUCOSE: 108 mg/dL — AB (ref 70–99)
POTASSIUM: 4 mmol/L (ref 3.5–5.1)
Sodium: 138 mmol/L (ref 135–145)

## 2015-03-21 LAB — I-STAT TROPONIN, ED: Troponin i, poc: 0 ng/mL (ref 0.00–0.08)

## 2015-03-21 NOTE — ED Provider Notes (Signed)
CSN: [id number]     Arrival date & time 03/21/15  1805 History   First MD Initiated Contact with Patient 03/21/15 2055     Chief Complaint  Patient presents with  . Leg Pain     (Consider location/radiation/quality/duration/timing/severity/associated sxs/prior Treatment) HPI Comments: Patient presents with right knee pain onset around 1 PM. Denies any falls or trauma. The pain is constant. It radiates to her thigh. It is improving since she got to the ED. She denies any weakness, numbness or tingling. No bowel or bladder incontinence. No back pain. No abdominal pain or urinary symptoms. She has a history of previous knee replacement on the left. She does not take any blood thinners. Patient also notes that she's had intermittent chest pain over the past several days lasting for a few seconds at a time it does not radiate. It is dissimilar to her angina type pain that she had last year. She had a 5 vessel bypass in February 2015. She is not having any chest pain currently. Her last episode of chest pain was yesterday and it lasted just a few seconds. She denies any shortness of breath, nausea or vomiting.  The history is provided by the patient.    Past Medical History  Diagnosis Date  . Seasonal allergies   . Asthma     History of  . Hypertension   . Iron deficiency 12-07-2011  . Anemia   . GERD (gastroesophageal reflux disease)   . Heart murmur   . Osteoporosis   . PONV (postoperative nausea and vomiting)   . Dysrhythmia     occ palpitations   . Sleep apnea     no CPAP machine; sleep study 02/2010 REM AHI 61.7/hr, total sleep REM 14.8/hr  . Headache(784.0)     occasional   . Osteoarthritis   . Morbid obesity   . Acute urinary retention s/p Foley 01/07/2012  . Hemorrhoids, internal, with bleeding & prolapse 12/13/2011  . Myocardial infarction     unsure when; per cardiologist report  . Chronic headaches   . Coronary artery disease   . Anginal pain   . Dyslipidemia   . S/P CABG  (coronary artery bypass graft)     x5 - LIMA to LAD, SVG to diagonal, SVG to OM1, SVG to OM2, SVG to PDA (Dr. Prescott Gum)  . Postmenopausal bleeding   . Neuropathy   . Blood dyscrasia     ABNORMAL VAGINAL BLEEDING  . Uterine cancer 7/15    clinical stage IA grade 1 endometrioid endometrial cancer   Past Surgical History  Procedure Laterality Date  . Knee surgery Bilateral 1999  . Total knee arthroplasty Left 04/29/2011  . Multiple tooth extractions    . Wisdom tooth extraction    . Colonoscopy  2009  . Knee arthroscopy  1991  . Transanal hemorrhoidal dearterliaization  01/06/12    with external hemorrhoid removal  . Coronary artery bypass graft N/A 01/09/2014    Procedure: CORONARY ARTERY BYPASS GRAFTING (CABG) x 5 using left internal mammary artery and right leg greater saphenous vein harvested endoscopically;  Surgeon: Ivin Poot, MD;  Location: [facility];  Service: Open Heart Surgery;  Laterality: N/A;  please use bed extenders and breast binder  . Intraoperative transesophageal echocardiogram N/A 01/09/2014    Procedure: INTRAOPERATIVE TRANSESOPHAGEAL ECHOCARDIOGRAM;  Surgeon: Ivin Poot, MD;  Location: [facility];  Service: Open Heart Surgery;  Laterality: N/A;  . Tee without cardioversion  01/2010    EF 60-65%, small,  flat, non-infiltrating, calcified, fixed apical/septal mass  . Nm myocar perf wall motion  01/2010    dipyridamole myoview - moderate perfusion defect in basal inferoseptal, basal inferior, mid inferoseptal, mid inferior, apical inferior region; EF 56%  . Cardiac catheterization    . Multiple extractions with alveoloplasty N/A 04/11/2014    Procedure: Extraction of tooth #'s 1,2,8,16 with alveoloplasty, maxillary tuberosity reductions, and gross debridement of remaining teeth.;  Surgeon: Lenn Cal, DDS;  Location: [facility];  Service: Oral Surgery;  Laterality: N/A;  . Vaginal hysterectomy N/A 06/03/2014    Procedure: HYSTERECTOMY VAGINAL;  Surgeon: Lavonia Drafts, MD;  Location: [facility];  Service: Gynecology;  Laterality: N/A;  . Unilateral salpingectomy Right 06/03/2014    Procedure: UNILATERAL SALPINGECTOMY;  Surgeon: Lavonia Drafts, MD;  Location: [facility];  Service: Gynecology;  Laterality: Right;  . Left heart catheterization with coronary angiogram N/A 01/04/2014    Procedure: LEFT HEART CATHETERIZATION WITH CORONARY ANGIOGRAM;  Surgeon: Sinclair Grooms, MD;  Location: [facility] CATH [facility];  Service: Cardiovascular;  Laterality: N/A;   Family History  Problem Relation Age of Onset  . Colon cancer Neg Hx   . Hypertension Sister   . Heart failure Sister   . Heart disease Sister   . Diabetes type II Sister   . Kidney disease Brother     hypertension  . Heart attack Mother 29  . Heart disease Mother   . Hypertension Mother   . Diabetes Mother   . Hypertension Child    History  Substance Use Topics  . Smoking status: Never Smoker   . Smokeless tobacco: Never Used  . Alcohol Use: No   OB History    Gravida Para Term Preterm AB TAB SAB Ectopic Multiple Living   2 2 2  0 0 0 0 0 0 2     Review of Systems  Constitutional: Negative for fever, activity change and appetite change.  Respiratory: Positive for chest tightness. Negative for cough and shortness of breath.   Cardiovascular: Negative for chest pain.  Gastrointestinal: Negative for nausea, vomiting and abdominal pain.  Genitourinary: Negative for dysuria and hematuria.  Musculoskeletal: Positive for myalgias and arthralgias.  Skin: Negative for rash.  Neurological: Negative for dizziness, weakness, light-headedness, numbness and headaches.  A complete 10 system review of systems was obtained and all systems are negative except as noted in the HPI and PMH.      Allergies  Cephalosporins; Fish-derived products; Peanuts; Codeine; Latex; Ciprofloxacin; Penicillins; and Prednisone  Home Medications   Prior to Admission medications   Medication Sig Start Date End  Date Taking? Authorizing Provider  aspirin 325 MG tablet Take 162.5 mg by mouth daily.   Yes Historical Provider, MD  CRESTOR 20 MG tablet TAKE 1 TABLET (20 MG TOTAL) BY MOUTH DAILY. 10/15/14  Yes [name], MD  [employer] SENNA PLUS 8.6-50 MG per tablet Take 1 tablet by mouth at bedtime as needed (constipation).  07/21/14  Yes Historical Provider, MD  cycloSPORINE (RESTASIS) 0.05 % ophthalmic emulsion Place 1 drop into both eyes 2 (two) times daily. 10/08/14  Yes Madilyn Fireman, MD  diclofenac sodium (VOLTAREN) 1 % GEL Apply 2 g topically 2 (two) times daily as needed (joint pain).   Yes Historical Provider, MD  ferrous sulfate 325 (65 FE) MG tablet Take 1 tablet (325 mg total) by mouth 3 (three) times daily with meals. 01/22/15 01/22/16 Yes Rushil Sherrye Payor, MD  FLUOCINOLONE ACETONIDE OT Place 1 drop in ear(s) daily  as needed.   Yes Historical Provider, MD  fluticasone (FLONASE) 50 MCG/ACT nasal spray Place 1 spray into both nostrils daily as needed for allergies or rhinitis.   Yes Historical Provider, MD  furosemide (LASIX) 20 MG tablet Take 20 mg by mouth every other day. PRN   Yes Historical Provider, MD  lisinopril (PRINIVIL,ZESTRIL) 10 MG tablet Take 1 tablet (10 mg total) by mouth daily. 03/05/15  Yes Francesca Oman, DO  metoprolol tartrate (LOPRESSOR) 25 MG tablet Take 25 mg by mouth 2 (two) times daily.   Yes Historical Provider, MD  NEXIUM 40 MG capsule TAKE ONE CAPSULE BY MOUTH EVERY DAY 02/03/15  Yes Madilyn Fireman, MD  albuterol (PROVENTIL HFA;VENTOLIN HFA) 108 (90 BASE) MCG/ACT inhaler Inhale 2 puffs into the lungs every 6 (six) hours as needed. Wheezing 09/25/13   Madilyn Fireman, MD  naproxen (NAPROSYN) 500 MG tablet Take 1 tablet (500 mg total) by mouth 2 (two) times daily. 03/22/15   Ezequiel Essex, MD   BP 150/62 mmHg  Pulse 77  Temp(Src) 98.5 F (36.9 C)  Resp 20  Ht 5\' 8"  (1.727 m)  Wt 330 lb (149.687 kg)  BMI 50.19 kg/m2  SpO2 99%  LMP 01/13/2014 Physical Exam  Constitutional:  She is oriented to person, place, and time. She appears well-developed and well-nourished. No distress.  Obese  HENT:  Head: Normocephalic and atraumatic.  Mouth/Throat: Oropharynx is clear and moist. No oropharyngeal exudate.  Eyes: Conjunctivae and EOM are normal. Pupils are equal, round, and reactive to light.  Neck: Normal range of motion. Neck supple.  No meningismus.  Cardiovascular: Normal rate, regular rhythm, normal heart sounds and intact distal pulses.   No murmur heard. Pulmonary/Chest: Effort normal and breath sounds normal. No respiratory distress.  Abdominal: Soft. There is no tenderness. There is no rebound and no guarding.  Musculoskeletal: Normal range of motion. She exhibits edema. She exhibits no tenderness.  Right knee mild diffuse tenderness without appreciable effusion. No erythema. Flexion and extension intact. Intact DP and PT pulses. Compartments soft. No calf tenderness or swelling.  Neurological: She is alert and oriented to person, place, and time. No cranial nerve deficit. She exhibits normal muscle tone. Coordination normal.  No ataxia on finger to nose bilaterally. No pronator drift. 5/5 strength throughout. CN 2-12 intact.Equal grip strength. Sensation intact.   Skin: Skin is warm.  Psychiatric: She has a normal mood and affect. Her behavior is normal.  Nursing note and vitals reviewed.   ED Course  Procedures (including critical care time) Labs Review Labs Reviewed  CBC - Abnormal; Notable for the following:    MCV 77.8 (*)    RDW 17.8 (*)    All other components within normal limits  BASIC METABOLIC PANEL - Abnormal; Notable for the following:    Glucose, Bld 108 (*)    GFR calc non Af Amer 89 (*)    All other components within normal limits  TROPONIN I  Randolm Idol, ED    Imaging Review Dg Chest 2 View  03/21/2015   CLINICAL DATA:  Leg pain. Generalized right knee pain for 1 day. Chest pain. Productive cough for 3 weeks.  EXAM: CHEST  2  VIEW  COMPARISON:  09/28/2014  FINDINGS: Status post median sternotomy and CABG. Heart is normal in size. Prominence of interstitial markings likely chronic. There are no focal consolidations or pleural effusions. No pulmonary edema. Mid thoracic spondylosis.  IMPRESSION: No evidence for acute  abnormality.   Electronically Signed  By: Nolon Nations M.D.   On: 03/21/2015 22:53   Dg Knee Complete 4 Views Right  03/21/2015   CLINICAL DATA:  Acute onset of generalized right knee pain. Initial encounter.  EXAM: RIGHT KNEE - COMPLETE 4+ VIEW  COMPARISON:  Right knee radiographs performed 04/30/2005, and right knee MRI performed 02/19/2009  FINDINGS: There is no evidence of fracture or dislocation. There is narrowing of the medial compartment. Marginal osteophytes are seen arising at all three compartments, and wall osteophytes are also noted. Mild cortical irregularity is noted along the medial femoral condyle. These changes are grossly stable from the prior MRI.  A small knee joint effusion is noted. Scattered vascular calcifications are seen. A few clips are seen along the medial aspect of the right knee.  IMPRESSION: 1. No evidence of fracture or dislocation. 2. Small knee joint effusion noted. 3. Mild tricompartmental osteoarthritis, with narrowing of the medial compartment. These findings are stable from the prior MRI. 4. Scattered vascular calcifications seen.   Electronically Signed   By: Garald Balding M.D.   On: 03/21/2015 22:53     EKG Interpretation   Date/Time:  Friday March 21 2015 19:11:53 EDT Ventricular Rate:  70 PR Interval:  174 QRS Duration: 84 QT Interval:  404 QTC Calculation: 436 R Axis:   12 Text Interpretation:  Normal sinus rhythm Inferior infarct , age  undetermined Anterior infarct , age undetermined Abnormal ECG No  significant change was found Confirmed by Wyvonnia Dusky  MD, Inis Borneman [phone]) on  03/21/2015 11:00:26 PM      MDM   Final diagnoses:  Left knee pain    atraumatic right knee pain with history of knee replacement on the opposite side. No focal weakness, numbness or tingling.  Suspect related to arthritis. Patient declines pain medication at this time. X-ray shows no fracture or dislocation. Small effusion with arthritic changes.  Patient's EKG is unchanged. Her troponin is negative. Discussed with cardiology fellow Dr. Andy Gauss the patient's fleeting chest pain over the past several days seems atypical for ACS. He agrees with troponin 2.  Patient is able to ambulate. Her pain is controlled. She is to follow-up with her orthopedic doctor regarding her knee pain. Doubt DVT. However patient wishes to have ultrasound anyway. We'll arrange for tomorrow morning. No hypoxia, tachycardia, chest pain or shortness of breath. No evidence of PE on exam.  Ezequiel Essex, MD 03/22/15 [phone]

## 2015-03-21 NOTE — ED Notes (Signed)
Dr. Rancour at the bedside.  

## 2015-03-21 NOTE — Telephone Encounter (Signed)
Dr. Debara Pickett reviewed note from cardiac rehab regarding BP. Advised to schedule OV with Dr. Debara Pickett - not urgent.   Will routed message to cardiac rehab nurse and Dr. Lysbeth Penner scheduler, Rollene Fare

## 2015-03-21 NOTE — ED Notes (Signed)
Pt. Having rt. Leg pain, began today and is constant and radiates from her thigh to her foot.  She denies any injuries.  When pt. Got to the triage room, She reported that she has been having intermittent chest pain also. But denies any chest pain at the present time.  She denies any n/v/d. She denies any sob.  Skin is warm and dry. GCS 15 ECG being completed in Triage.

## 2015-03-22 ENCOUNTER — Ambulatory Visit (HOSPITAL_COMMUNITY): Admission: RE | Admit: 2015-03-22 | Payer: Medicare Other | Source: Ambulatory Visit

## 2015-03-22 LAB — TROPONIN I: Troponin I: <0.03 ng/mL (ref <0.031)

## 2015-03-22 MED ORDER — NAPROXEN 500 MG PO TABS
500.0000 mg | ORAL_TABLET | Freq: Two times a day (BID) | ORAL | Status: DC
Start: 2015-03-22 — End: 2015-06-19

## 2015-03-22 NOTE — ED Notes (Signed)
Emt, kristen reports patient ambulated to the restroom with cane, no assist needed, steady gait.

## 2015-03-22 NOTE — Discharge Instructions (Signed)
Arthralgia Return to the vascular lab tomorrow for ultrasound of your leg. Take the anti-inflammatories as prescribed. Follow-up with the orthopedic doctor. Return to the ED if he develop new or worsening symptoms. Your caregiver has diagnosed you as suffering from an arthralgia. Arthralgia means there is pain in a joint. This can come from many reasons including:  Bruising the joint which causes soreness (inflammation) in the joint.  Wear and tear on the joints which occur as we grow older (osteoarthritis).  Overusing the joint.  Various forms of arthritis.  Infections of the joint. Regardless of the cause of pain in your joint, most of these different pains respond to anti-inflammatory drugs and rest. The exception to this is when a joint is infected, and these cases are treated with antibiotics, if it is a bacterial infection. HOME CARE INSTRUCTIONS   Rest the injured area for as long as directed by your caregiver. Then slowly start using the joint as directed by your caregiver and as the pain allows. Crutches as directed may be useful if the ankles, knees or hips are involved. If the knee was splinted or casted, continue use and care as directed. If an stretchy or elastic wrapping bandage has been applied today, it should be removed and re-applied every 3 to 4 hours. It should not be applied tightly, but firmly enough to keep swelling down. Watch toes and feet for swelling, bluish discoloration, coldness, numbness or excessive pain. If any of these problems (symptoms) occur, remove the ace bandage and re-apply more loosely. If these symptoms persist, contact your caregiver or return to this location.  For the first 24 hours, keep the injured extremity elevated on pillows while lying down.  Apply ice for 15-20 minutes to the sore joint every couple hours while awake for the first half day. Then 03-04 times per day for the first 48 hours. Put the ice in a plastic bag and place a towel between  the bag of ice and your skin.  Wear any splinting, casting, elastic bandage applications, or slings as instructed.  Only take over-the-counter or prescription medicines for pain, discomfort, or fever as directed by your caregiver. Do not use aspirin immediately after the injury unless instructed by your physician. Aspirin can cause increased bleeding and bruising of the tissues.  If you were given crutches, continue to use them as instructed and do not resume weight bearing on the sore joint until instructed. Persistent pain and inability to use the sore joint as directed for more than 2 to 3 days are warning signs indicating that you should see a caregiver for a follow-up visit as soon as possible. Initially, a hairline fracture (break in bone) may not be evident on X-rays. Persistent pain and swelling indicate that further evaluation, non-weight bearing or use of the joint (use of crutches or slings as instructed), or further X-rays are indicated. X-rays may sometimes not show a small fracture until a week or 10 days later. Make a follow-up appointment with your own caregiver or one to whom we have referred you. A radiologist (specialist in reading X-rays) may read your X-rays. Make sure you know how you are to obtain your X-ray results. Do not assume everything is normal if you do not hear from [place]. SEEK MEDICAL CARE IF: Bruising, swelling, or pain increases. SEEK IMMEDIATE MEDICAL CARE IF:   Your fingers or toes are numb or blue.  The pain is not responding to medications and continues to stay the same or get worse.  The pain in your joint becomes severe.  You develop a fever over 102 F (38.9 C).  It becomes impossible to move or use the joint. MAKE SURE YOU:   Understand these instructions.  Will watch your condition.  Will get help right away if you are not doing well or get worse. Document Released: 11/08/2005 Document Revised: 01/31/2012 Document Reviewed: 06/26/2008 [facility]  Patient Information 2015 [place], [place]. This information is not intended to replace advice given to you by your health care provider. Make sure you discuss any questions you have with your health care provider.

## 2015-03-23 ENCOUNTER — Other Ambulatory Visit (HOSPITAL_COMMUNITY): Payer: Self-pay | Admitting: Emergency Medicine

## 2015-03-23 ENCOUNTER — Ambulatory Visit (HOSPITAL_COMMUNITY)
Admission: RE | Admit: 2015-03-23 | Discharge: 2015-03-23 | Disposition: A | Discharge status: Home / Self Care | Payer: Medicare Other | Source: Ambulatory Visit | Attending: Emergency Medicine | Admitting: Emergency Medicine

## 2015-03-23 DIAGNOSIS — R609 Edema, unspecified: Secondary | ICD-10-CM

## 2015-03-23 NOTE — Progress Notes (Signed)
VASCULAR LAB PRELIMINARY  PRELIMINARY  PRELIMINARY  PRELIMINARY  Right lower extremity venous Doppler completed.    Preliminary report:  There is no DVT or SVT noted in the right lower extremity.  Sharion Dove, [place] 03/23/2015, 9:31 AM

## 2015-03-24 ENCOUNTER — Encounter (HOSPITAL_COMMUNITY): Payer: Medicare Other

## 2015-03-24 DIAGNOSIS — Z951 Presence of aortocoronary bypass graft: Secondary | ICD-10-CM | POA: Insufficient documentation

## 2015-03-26 ENCOUNTER — Encounter (HOSPITAL_COMMUNITY): Payer: Medicare Other

## 2015-03-26 DIAGNOSIS — Z951 Presence of aortocoronary bypass graft: Secondary | ICD-10-CM | POA: Diagnosis not present

## 2015-03-28 ENCOUNTER — Other Ambulatory Visit: Payer: Self-pay | Admitting: Internal Medicine

## 2015-03-28 ENCOUNTER — Encounter (HOSPITAL_COMMUNITY)
Admission: RE | Admit: 2015-03-28 | Discharge: 2015-03-28 | Disposition: A | Discharge status: Home / Self Care | Payer: Medicare Other | Source: Ambulatory Visit | Attending: Internal Medicine | Admitting: Internal Medicine

## 2015-03-28 DIAGNOSIS — I10 Essential (primary) hypertension: Secondary | ICD-10-CM

## 2015-03-28 MED ORDER — LISINOPRIL 10 MG PO TABS
10.0000 mg | ORAL_TABLET | Freq: Every day | ORAL | Status: DC
Start: 2015-03-28 — End: 2015-04-02

## 2015-03-28 NOTE — Progress Notes (Signed)
Pt cam to cardiac rehab maintenance program today but did not exercise due to elevated blood pressure at rest. Initial BP was 174/98, recheck after rest 164/98. Patient states BP's are elevated at home as well. The nurse case manager contacted patient's cardiology office to try and get patient scheduled for an office visit to address elevated BP.

## 2015-03-30 ENCOUNTER — Encounter (HOSPITAL_COMMUNITY): Payer: Self-pay

## 2015-03-30 ENCOUNTER — Emergency Department (INDEPENDENT_AMBULATORY_CARE_PROVIDER_SITE_OTHER): Payer: Medicare Other

## 2015-03-30 ENCOUNTER — Other Ambulatory Visit: Payer: Self-pay | Admitting: Internal Medicine

## 2015-03-30 ENCOUNTER — Emergency Department (INDEPENDENT_AMBULATORY_CARE_PROVIDER_SITE_OTHER)
Admission: EM | Admit: 2015-03-30 | Discharge: 2015-03-30 | Disposition: A | Discharge status: Home / Self Care | Payer: Medicare Other | Source: Home / Self Care | Attending: Family Medicine | Admitting: Family Medicine

## 2015-03-30 DIAGNOSIS — J40 Bronchitis, not specified as acute or chronic: Secondary | ICD-10-CM

## 2015-03-30 DIAGNOSIS — R0982 Postnasal drip: Secondary | ICD-10-CM | POA: Diagnosis not present

## 2015-03-30 DIAGNOSIS — N390 Urinary tract infection, site not specified: Secondary | ICD-10-CM

## 2015-03-30 LAB — POCT URINALYSIS DIP (DEVICE)
BILIRUBIN URINE: NEGATIVE
Glucose, UA: NEGATIVE mg/dL
Ketones, ur: NEGATIVE mg/dL
LEUKOCYTES UA: NEGATIVE
Nitrite: NEGATIVE
PH: 5.5 (ref 5.0–8.0)
Protein, ur: NEGATIVE mg/dL
Specific Gravity, Urine: 1.02 (ref 1.005–1.030)
Urobilinogen, UA: 1 mg/dL (ref 0.0–1.0)

## 2015-03-30 MED ORDER — SULFAMETHOXAZOLE-TRIMETHOPRIM 800-160 MG PO TABS: 1.0000 | ORAL_TABLET | Freq: Two times a day (BID) | ORAL | Status: DC

## 2015-03-30 MED ORDER — IPRATROPIUM-ALBUTEROL 0.5-2.5 (3) MG/3ML IN SOLN
RESPIRATORY_TRACT | Status: AC
  Filled 2015-03-30: qty 3

## 2015-03-30 MED ORDER — IPRATROPIUM-ALBUTEROL 0.5-2.5 (3) MG/3ML IN SOLN
3.0000 mL | Freq: Once | RESPIRATORY_TRACT | Status: AC
  Administered 2015-03-30: 3 mL via RESPIRATORY_TRACT

## 2015-03-30 MED ORDER — IPRATROPIUM BROMIDE 0.06 % NA SOLN: 2.0000 | Freq: Four times a day (QID) | NASAL | Status: DC

## 2015-03-30 MED ORDER — ALBUTEROL SULFATE HFA 108 (90 BASE) MCG/ACT IN AERS: 2.0000 | INHALATION_SPRAY | Freq: Four times a day (QID) | RESPIRATORY_TRACT | Status: DC | PRN

## 2015-03-30 MED ORDER — PREDNISONE 10 MG PO TABS: 30.0000 mg | ORAL_TABLET | Freq: Every day | ORAL | Status: DC

## 2015-03-30 NOTE — Discharge Instructions (Signed)
Thank you for coming in today. °Call or go to the emergency room if you get worse, have trouble breathing, have chest pains, or palpitations.  ° °Acute Bronchitis °Bronchitis is inflammation of the airways that extend from the windpipe into the lungs (bronchi). The inflammation often causes mucus to develop. This leads to a cough, which is the most common symptom of bronchitis.  °In acute bronchitis, the condition usually develops suddenly and goes away over time, usually in a couple weeks. Smoking, allergies, and asthma can make bronchitis worse. Repeated episodes of bronchitis may cause further lung problems.  °CAUSES °Acute bronchitis is most often caused by the same virus that causes a cold. The virus can spread from person to person (contagious) through coughing, sneezing, and touching contaminated objects. °SIGNS AND SYMPTOMS  °· Cough.   °· Fever.   °· Coughing up mucus.   °· Body aches.   °· Chest congestion.   °· Chills.   °· Shortness of breath.   °· Sore throat.   °DIAGNOSIS  °Acute bronchitis is usually diagnosed through a physical exam. Your health care provider will also ask you questions about your medical history. Tests, such as chest X-rays, are sometimes done to rule out other conditions.  °TREATMENT  °Acute bronchitis usually goes away in a couple weeks. Oftentimes, no medical treatment is necessary. Medicines are sometimes given for relief of fever or cough. Antibiotic medicines are usually not needed but may be prescribed in certain situations. In some cases, an inhaler may be recommended to help reduce shortness of breath and control the cough. A cool mist vaporizer may also be used to help thin bronchial secretions and make it easier to clear the chest.  °HOME CARE INSTRUCTIONS °· Get plenty of rest.   °· Drink enough fluids to keep your urine clear or pale yellow (unless you have a medical condition that requires fluid restriction). Increasing fluids may help thin your respiratory secretions  (sputum) and reduce chest congestion, and it will prevent dehydration.   °· Take medicines only as directed by your health care provider. °· If you were prescribed an antibiotic medicine, finish it all even if you start to feel better. °· Avoid smoking and secondhand smoke. Exposure to cigarette smoke or irritating chemicals will make bronchitis worse. If you are a smoker, consider using nicotine gum or skin patches to help control withdrawal symptoms. Quitting smoking will help your lungs heal faster.   °· Reduce the chances of another bout of acute bronchitis by washing your hands frequently, avoiding people with cold symptoms, and trying not to touch your hands to your mouth, nose, or eyes.   °· Keep all follow-up visits as directed by your health care provider.   °SEEK MEDICAL CARE IF: °Your symptoms do not improve after 1 week of treatment.  °SEEK IMMEDIATE MEDICAL CARE IF: °· You develop an increased fever or chills.   °· You have chest pain.   °· You have severe shortness of breath. °· You have bloody sputum.   °· You develop dehydration. °· You faint or repeatedly feel like you are going to pass out. °· You develop repeated vomiting. °· You develop a severe headache. °MAKE SURE YOU:  °· Understand these instructions. °· Will watch your condition. °· Will get help right away if you are not doing well or get worse. °Document Released: 12/16/2004 Document Revised: 03/25/2014 Document Reviewed: 05/01/2013 °[employer]® Patient Information ©2015 [employer]. This information is not intended to replace advice given to you by your health care provider. Make sure you discuss any questions you have with your   health care provider.   Urinary Tract Infection Urinary tract infections (UTIs) can develop anywhere along your urinary tract. Your urinary tract is your body's drainage system for removing wastes and extra water. Your urinary tract includes two kidneys, two ureters, a bladder, and a urethra. Your kidneys are  a pair of bean-shaped organs. Each kidney is about the size of your fist. They are located below your ribs, one on each side of your spine. CAUSES Infections are caused by microbes, which are microscopic organisms, including fungi, viruses, and bacteria. These organisms are so small that they can only be seen through a microscope. Bacteria are the microbes that most commonly cause UTIs. SYMPTOMS  Symptoms of UTIs may vary by age and gender of the patient and by the location of the infection. Symptoms in young women typically include a frequent and intense urge to urinate and a painful, burning feeling in the bladder or urethra during urination. Older women and men are more likely to be tired, shaky, and weak and have muscle aches and abdominal pain. A fever may mean the infection is in your kidneys. Other symptoms of a kidney infection include pain in your back or sides below the ribs, nausea, and vomiting. DIAGNOSIS To diagnose a UTI, your caregiver will ask you about your symptoms. Your caregiver also will ask to provide a urine sample. The urine sample will be tested for bacteria and white blood cells. White blood cells are made by your body to help fight infection. TREATMENT  Typically, UTIs can be treated with medication. Because most UTIs are caused by a bacterial infection, they usually can be treated with the use of antibiotics. The choice of antibiotic and length of treatment depend on your symptoms and the type of bacteria causing your infection. HOME CARE INSTRUCTIONS  If you were prescribed antibiotics, take them exactly as your caregiver instructs you. Finish the medication even if you feel better after you have only taken some of the medication.  Drink enough water and fluids to keep your urine clear or pale yellow.  Avoid caffeine, tea, and carbonated beverages. They tend to irritate your bladder.  Empty your bladder often. Avoid holding urine for long periods of time.  Empty your  bladder before and after sexual intercourse.  After a bowel movement, women should cleanse from front to back. Use each tissue only once. SEEK MEDICAL CARE IF:   You have back pain.  You develop a fever.  Your symptoms do not begin to resolve within 3 days. SEEK IMMEDIATE MEDICAL CARE IF:   You have severe back pain or lower abdominal pain.  You develop chills.  You have nausea or vomiting.  You have continued burning or discomfort with urination. MAKE SURE YOU:   Understand these instructions.  Will watch your condition.  Will get help right away if you are not doing well or get worse. Document Released: 08/18/2005 Document Revised: 05/09/2012 Document Reviewed: 12/17/2011 [facility] Patient Information 2015 [place], [place]. This information is not intended to replace advice given to you by your health care provider. Make sure you discuss any questions you have with your health care provider.

## 2015-03-30 NOTE — ED Provider Notes (Signed)
[name] is a 59 y.o. female who presents to Urgent Care today for breathing issues and urinary frequency.  1) urinary frequency urgency and dysuria present for 4 days. No treatment tried yet. No abdominal pain fevers or chills.  2) throat congestion and a itchy throat feeling in her throat present for about 6 days. She describes this is having trouble breathing. When asked she denies any significant wheezing or chest pain or actual shortness of breath. She denies any foot or leg swelling. She feels well otherwise. No treatment tried yet.    Past Medical History  Diagnosis Date  . Seasonal allergies   . Asthma     History of  . Hypertension   . Iron deficiency 12-07-2011  . Anemia   . GERD (gastroesophageal reflux disease)   . Heart murmur   . Osteoporosis   . PONV (postoperative nausea and vomiting)   . Dysrhythmia     occ palpitations   . Sleep apnea     no CPAP machine; sleep study 02/2010 REM AHI 61.7/hr, total sleep REM 14.8/hr  . Headache(784.0)     occasional   . Osteoarthritis   . Morbid obesity   . Acute urinary retention s/p Foley 01/07/2012  . Hemorrhoids, internal, with bleeding & prolapse 12/13/2011  . Myocardial infarction     unsure when; per cardiologist report  . Chronic headaches   . Coronary artery disease   . Anginal pain   . Dyslipidemia   . S/P CABG (coronary artery bypass graft)     x5 - LIMA to LAD, SVG to diagonal, SVG to OM1, SVG to OM2, SVG to PDA (Dr. [place] Gum)  . Postmenopausal bleeding   . Neuropathy   . Blood dyscrasia     ABNORMAL VAGINAL BLEEDING  . Uterine cancer 7/15    clinical stage IA grade 1 endometrioid endometrial cancer   Past Surgical History  Procedure Laterality Date  . Knee surgery Bilateral 1999  . Total knee arthroplasty Left 04/29/2011  . Multiple tooth extractions    . Wisdom tooth extraction    . Colonoscopy  2009  . Knee arthroscopy  1991  . Transanal hemorrhoidal dearterliaization  01/06/12    with external  hemorrhoid removal  . Coronary artery bypass graft N/A 01/09/2014    Procedure: CORONARY ARTERY BYPASS GRAFTING (CABG) x 5 using left internal mammary artery and right leg greater saphenous vein harvested endoscopically;  Surgeon: Ivin Poot, MD;  Location: [facility];  Service: Open Heart Surgery;  Laterality: N/A;  please use bed extenders and breast binder  . Intraoperative transesophageal echocardiogram N/A 01/09/2014    Procedure: INTRAOPERATIVE TRANSESOPHAGEAL ECHOCARDIOGRAM;  Surgeon: Ivin Poot, MD;  Location: [facility];  Service: Open Heart Surgery;  Laterality: N/A;  . Tee without cardioversion  01/2010    EF 60-65%, small, flat, non-infiltrating, calcified, fixed apical/septal mass  . Nm myocar perf wall motion  01/2010    dipyridamole myoview - moderate perfusion defect in basal inferoseptal, basal inferior, mid inferoseptal, mid inferior, apical inferior region; EF 56%  . Cardiac catheterization    . Multiple extractions with alveoloplasty N/A 04/11/2014    Procedure: Extraction of tooth #'s 1,2,8,16 with alveoloplasty, maxillary tuberosity reductions, and gross debridement of remaining teeth.;  Surgeon: Lenn Cal, DDS;  Location: [facility];  Service: Oral Surgery;  Laterality: N/A;  . Vaginal hysterectomy N/A 06/03/2014    Procedure: HYSTERECTOMY VAGINAL;  Surgeon: Lavonia Drafts, MD;  Location: [facility];  Service: Gynecology;  Laterality: N/A;  . Unilateral salpingectomy Right 06/03/2014    Procedure: UNILATERAL SALPINGECTOMY;  Surgeon: Lavonia Drafts, MD;  Location: [facility];  Service: Gynecology;  Laterality: Right;  . Left heart catheterization with coronary angiogram N/A 01/04/2014    Procedure: LEFT HEART CATHETERIZATION WITH CORONARY ANGIOGRAM;  Surgeon: Sinclair Grooms, MD;  Location: [facility] CATH LAB;  Service: Cardiovascular;  Laterality: N/A;   History  Substance Use Topics  . Smoking status: Never Smoker   . Smokeless tobacco: Never Used  . Alcohol Use: No    ROS as above Medications: No current facility-administered medications for this encounter.   Current Outpatient Prescriptions  Medication Sig Dispense Refill  . albuterol (PROVENTIL HFA;VENTOLIN HFA) 108 (90 BASE) MCG/ACT inhaler Inhale 2 puffs into the lungs every 6 (six) hours as needed for wheezing or shortness of breath. 1 Inhaler 2  . aspirin 325 MG tablet Take 162.5 mg by mouth daily.    . CRESTOR 20 MG tablet TAKE 1 TABLET (20 MG TOTAL) BY MOUTH DAILY. 30 tablet 5  . [employer] SENNA PLUS 8.6-50 MG per tablet Take 1 tablet by mouth at bedtime as needed (constipation).   5  . cycloSPORINE (RESTASIS) 0.05 % ophthalmic emulsion Place 1 drop into both eyes 2 (two) times daily. 0.4 mL 2  . diclofenac sodium (VOLTAREN) 1 % GEL Apply 2 g topically 2 (two) times daily as needed (joint pain).    . ferrous sulfate 325 (65 FE) MG tablet Take 1 tablet (325 mg total) by mouth 3 (three) times daily with meals. 90 tablet 3  . FLUOCINOLONE ACETONIDE OT Place 1 drop in ear(s) daily as needed.    . fluticasone (FLONASE) 50 MCG/ACT nasal spray Place 1 spray into both nostrils daily as needed for allergies or rhinitis.    . furosemide (LASIX) 20 MG tablet Take 20 mg by mouth every other day. PRN    . ipratropium (ATROVENT) 0.06 % nasal spray Place 2 sprays into both nostrils 4 (four) times daily. 15 mL 1  . lisinopril (PRINIVIL,ZESTRIL) 10 MG tablet Take 1 tablet (10 mg total) by mouth daily. 30 tablet 1  . metoprolol tartrate (LOPRESSOR) 25 MG tablet Take 25 mg by mouth 2 (two) times daily.    . naproxen (NAPROSYN) 500 MG tablet Take 1 tablet (500 mg total) by mouth 2 (two) times daily. 30 tablet 0  . NEXIUM 40 MG capsule TAKE ONE CAPSULE BY MOUTH EVERY DAY 30 capsule 1  . predniSONE (DELTASONE) 10 MG tablet Take 3 tablets (30 mg total) by mouth daily. 15 tablet 0  . sulfamethoxazole-trimethoprim (BACTRIM DS,SEPTRA DS) 800-160 MG per tablet Take 1 tablet by mouth 2 (two) times daily. 14 tablet 0   Allergies   Allergen Reactions  . Cephalosporins Anaphylaxis    Tongue swelling, gum pain  . Fish-Derived Products Shortness Of Breath  . Peanuts [Peanut Oil] Shortness Of Breath    Peanut butter-   . Codeine Other (See Comments)    hallucinations  . Latex Itching  . Ciprofloxacin   . Penicillins Other (See Comments)    Childhood allergy but pt thinks she is no longer allergic  . Prednisone Other (See Comments)    Heart beating fast        Exam:  BP 172/88 mmHg  Pulse 93  Temp(Src) 98.9 F (37.2 C) (Oral)  Resp 18  SpO2 97%  LMP 01/13/2014 Gen: Well NAD obese HEENT: EOMI,  MMM, posterior pharynx with cobblestoning. Normal tympanic membranes bilaterally.  Lungs: Normal work of breathing. CTABL Heart: RRR no MRG Abd: NABS, Soft. Nondistended, Nontender Exts: Brisk capillary refill, warm and well perfused.   Patient was given a 2.5/0.5 mg DuoNeb nebulizer treatment and felt better  Results for orders placed or performed during the hospital encounter of 03/30/15 (from the past 24 hour(s))  POCT urinalysis dip (device)     Status: Abnormal   Collection Time: 03/30/15 12:46 PM  Result Value Ref Range   Glucose, UA NEGATIVE NEGATIVE mg/dL   Bilirubin Urine NEGATIVE NEGATIVE   Ketones, ur NEGATIVE NEGATIVE mg/dL   Specific Gravity, Urine 1.020 1.005 - 1.030   Hgb urine dipstick MODERATE (A) NEGATIVE   pH 5.5 5.0 - 8.0   Protein, ur NEGATIVE NEGATIVE mg/dL   Urobilinogen, UA 1.0 0.0 - 1.0 mg/dL   Nitrite NEGATIVE NEGATIVE   Leukocytes, UA NEGATIVE NEGATIVE   Dg Chest 2 View  03/30/2015   CLINICAL DATA:  Cough and shortness of breath for the past 6 days.  EXAM: CHEST  2 VIEW  COMPARISON:  03/21/2015.  FINDINGS: Interval mild enlargement of the cardiac silhouette. The pulmonary vasculature and interstitial markings remain mildly prominent. Stable calcification in the region of the left ventricle of the heart. Stable post CABG changes. Thoracic spine degenerative changes. Left shoulder  degenerative changes.  IMPRESSION: 1. Interval mild cardiomegaly. 2. Stable mild pulmonary vascular congestion and mild chronic interstitial lung disease.   Electronically Signed   By: Claudie Revering M.D.   On: 03/30/2015 12:48    Assessment and Plan: 59 y.o. female with  1) bronchitis. Treatment with prednisone and albuterol. 2) UTI: Treat with Bactrim as patient is allergic to cephalosporins and fluoroquinolones.. Culture pending.  Discussed warning signs or symptoms. Please see discharge instructions. Patient expresses understanding.     Gregor Hams, MD 03/30/15 [phone]

## 2015-03-30 NOTE — ED Notes (Signed)
C/o has had increasing SOB since Monday. Denies swelling in feet, denies pain

## 2015-03-31 ENCOUNTER — Encounter (HOSPITAL_COMMUNITY): Payer: Self-pay

## 2015-03-31 ENCOUNTER — Encounter (HOSPITAL_COMMUNITY): Payer: Medicare Other

## 2015-03-31 LAB — URINE CULTURE
Colony Count: 60000
Special Requests: NORMAL

## 2015-04-02 ENCOUNTER — Encounter (HOSPITAL_COMMUNITY): Payer: Medicare Other

## 2015-04-02 ENCOUNTER — Encounter: Payer: Self-pay | Admitting: *Deleted

## 2015-04-02 ENCOUNTER — Ambulatory Visit (INDEPENDENT_AMBULATORY_CARE_PROVIDER_SITE_OTHER): Payer: Medicare Other | Admitting: Internal Medicine

## 2015-04-02 VITALS — BP 140/70 | HR 80 | Ht 68.0 in | Wt 324.3 lb

## 2015-04-02 DIAGNOSIS — I10 Essential (primary) hypertension: Secondary | ICD-10-CM

## 2015-04-02 DIAGNOSIS — Z951 Presence of aortocoronary bypass graft: Secondary | ICD-10-CM

## 2015-04-02 MED ORDER — LISINOPRIL 20 MG PO TABS: 20.0000 mg | ORAL_TABLET | Freq: Every day | ORAL | Status: DC

## 2015-04-02 NOTE — ED Notes (Signed)
Final report of culture shows multiple species. No further action required

## 2015-04-02 NOTE — Patient Instructions (Signed)
INCREASE Lisinopril to 20mg  daily.  Your physician recommends that you schedule a follow-up appointment in: 6 months with Dr. Debara Pickett.

## 2015-04-03 ENCOUNTER — Encounter: Payer: Self-pay | Admitting: Internal Medicine

## 2015-04-03 NOTE — Progress Notes (Signed)
OFFICE NOTE  Chief Complaint:  Hypertension  Primary Care Physician: Madilyn Fireman, MD  HPI:  [name] is a 59 year old African American obese female presented to 01/01/14 with symptoms of unstable angina. I had seen her once in 2012, but not since then.  She recently had been having chest pain and presented urgently when she became short of breath at home and had chest tightness, which started as right arm pain and right chest pain, then moved substernally. She ultimately underwent cardiac catheterization via right radial artery which demonstrated severe three-vessel CAD with graftable target vessels. Echocardiogram showed good LV function with some left ventricular hypertrophy from her hypertension and there does not appear to be significant valve disease. Carotid dopplers showed a 1-39% bilateral carotid artery stenosis. She underwent a CABG x 5 on 01/09/2014. Left internal mammary artery to LAD, saphenous vein graft to diagonal, sequential saphenous vein graft to OM1 and OM2, saphenous vein graft to posterior descending. She had multiple teeth extractions prior to surgery. She did well post op. She was anemic but did not need transfusion. At discharge she went to SNF for rehab-she did not stay the recommended time frame due to BR not being available when she needed it. She was not given discharge medications when she left SNF. She saw Cecilie Kicks, FNP, in followup for her hospitalization and was restarted on some of her medications.   Mrs. Dorow was seen today in the office for follow-up. At her last office visit I cleared her for surgery and she underwent hysterectomy and has had resolution of her bleeding problems. She is now contemplating right knee surgery and also breast reduction surgery. She's in the interim seen in the hospital for palpitations. She reports those have improved with medication adjustment. She's decreased her Lasix now to every other day and I think she can  probably take it as needed. She denies any chest pain worsening shortness of breath. She is undergoing injections to reduce the size of her keloid scar.  I saw Mrs. Teegarden back in the office today. She's been undergoing rehabilitation and really wants to continue it. She's managed to lose some weight and feels better. Recently though she's been having increased blood pressures. Does have ranged up to 536 systolic. She had a small increase in her lisinopril to 10 mg daily with an improvement however this staff does not want to continue her at rehabilitation until her blood pressure is better controlled. She denies any chest pain or palpitations.  PMHx:  Past Medical History  Diagnosis Date  . Seasonal allergies   . Asthma     History of  . Hypertension   . Iron deficiency 12-07-2011  . Anemia   . GERD (gastroesophageal reflux disease)   . Heart murmur   . Osteoporosis   . PONV (postoperative nausea and vomiting)   . Dysrhythmia     occ palpitations   . Sleep apnea     no CPAP machine; sleep study 02/2010 REM AHI 61.7/hr, total sleep REM 14.8/hr  . Headache(784.0)     occasional   . Osteoarthritis   . Morbid obesity   . Acute urinary retention s/p Foley 01/07/2012  . Hemorrhoids, internal, with bleeding & prolapse 12/13/2011  . Myocardial infarction     unsure when; per cardiologist report  . Chronic headaches   . Coronary artery disease   . Anginal pain   . Dyslipidemia   . S/P CABG (coronary artery bypass graft)  x5 - LIMA to LAD, SVG to diagonal, SVG to OM1, SVG to OM2, SVG to PDA (Dr. Prescott Gum)  . Postmenopausal bleeding   . Neuropathy   . Blood dyscrasia     ABNORMAL VAGINAL BLEEDING  . Uterine cancer 7/15    clinical stage IA grade 1 endometrioid endometrial cancer    Past Surgical History  Procedure Laterality Date  . Knee surgery Bilateral 1999  . Total knee arthroplasty Left 04/29/2011  . Multiple tooth extractions    . Wisdom tooth extraction    . Colonoscopy   2009  . Knee arthroscopy  1991  . Transanal hemorrhoidal dearterliaization  01/06/12    with external hemorrhoid removal  . Coronary artery bypass graft N/A 01/09/2014    Procedure: CORONARY ARTERY BYPASS GRAFTING (CABG) x 5 using left internal mammary artery and right leg greater saphenous vein harvested endoscopically;  Surgeon: Ivin Poot, MD;  Location: [facility];  Service: Open Heart Surgery;  Laterality: N/A;  please use bed extenders and breast binder  . Intraoperative transesophageal echocardiogram N/A 01/09/2014    Procedure: INTRAOPERATIVE TRANSESOPHAGEAL ECHOCARDIOGRAM;  Surgeon: Ivin Poot, MD;  Location: [facility];  Service: Open Heart Surgery;  Laterality: N/A;  . Tee without cardioversion  01/2010    EF 60-65%, small, flat, non-infiltrating, calcified, fixed apical/septal mass  . Nm myocar perf wall motion  01/2010    dipyridamole myoview - moderate perfusion defect in basal inferoseptal, basal inferior, mid inferoseptal, mid inferior, apical inferior region; EF 56%  . Cardiac catheterization    . Multiple extractions with alveoloplasty N/A 04/11/2014    Procedure: Extraction of tooth #'s 1,2,8,16 with alveoloplasty, maxillary tuberosity reductions, and gross debridement of remaining teeth.;  Surgeon: Lenn Cal, DDS;  Location: [facility];  Service: Oral Surgery;  Laterality: N/A;  . Vaginal hysterectomy N/A 06/03/2014    Procedure: HYSTERECTOMY VAGINAL;  Surgeon: Lavonia Drafts, MD;  Location: [facility];  Service: Gynecology;  Laterality: N/A;  . Unilateral salpingectomy Right 06/03/2014    Procedure: UNILATERAL SALPINGECTOMY;  Surgeon: Lavonia Drafts, MD;  Location: [facility];  Service: Gynecology;  Laterality: Right;  . Left heart catheterization with coronary angiogram N/A 01/04/2014    Procedure: LEFT HEART CATHETERIZATION WITH CORONARY ANGIOGRAM;  Surgeon: Sinclair Grooms, MD;  Location: [facility] CATH [facility];  Service: Cardiovascular;  Laterality: N/A;    FAMHx:  Family  History  Problem Relation Age of Onset  . Colon cancer Neg Hx   . Hypertension Sister   . Heart failure Sister   . Heart disease Sister   . Diabetes type II Sister   . Kidney disease Brother     hypertension  . Heart attack Mother 11  . Heart disease Mother   . Hypertension Mother   . Diabetes Mother   . Hypertension Child     SOCHx:   reports that she has never smoked. She has never used smokeless tobacco. She reports that she does not drink alcohol or use illicit drugs.  ALLERGIES:  Allergies  Allergen Reactions  . Cephalosporins Anaphylaxis    Tongue swelling, gum pain  . Fish-Derived Products Shortness Of Breath  . Peanuts [Peanut Oil] Shortness Of Breath    Peanut butter-   . Codeine Other (See Comments)    hallucinations  . Latex Itching  . Ciprofloxacin   . Penicillins Other (See Comments)    Childhood allergy but pt thinks she is no longer allergic  . Prednisone Other (See Comments)  Heart beating fast       ROS: A comprehensive review of systems was negative except for: Cardiovascular: positive for Uncontrolled hypertension  HOME MEDS: Current Outpatient Prescriptions  Medication Sig Dispense Refill  . albuterol (PROVENTIL HFA;VENTOLIN HFA) 108 (90 BASE) MCG/ACT inhaler Inhale 2 puffs into the lungs every 6 (six) hours as needed for wheezing or shortness of breath. 1 Inhaler 2  . aspirin 325 MG tablet Take 162.5 mg by mouth daily.    . CRESTOR 20 MG tablet TAKE 1 TABLET (20 MG TOTAL) BY MOUTH DAILY. 30 tablet 5  . CVS SENNA PLUS 8.6-50 MG per tablet Take 1 tablet by mouth at bedtime as needed (constipation).   5  . cycloSPORINE (RESTASIS) 0.05 % ophthalmic emulsion Place 1 drop into both eyes 2 (two) times daily. 0.4 mL 2  . diclofenac sodium (VOLTAREN) 1 % GEL Apply 2 g topically 2 (two) times daily as needed (joint pain).    Marland Kitchen esomeprazole (NEXIUM) 40 MG capsule TAKE ONE CAPSULE BY MOUTH EVERY DAY 30 capsule 1  . ferrous sulfate 325 (65 FE) MG tablet  Take 1 tablet (325 mg total) by mouth 3 (three) times daily with meals. 90 tablet 3  . FLUOCINOLONE ACETONIDE OT Place 1 drop in ear(s) daily as needed.    . fluticasone (FLONASE) 50 MCG/ACT nasal spray Place 1 spray into both nostrils daily as needed for allergies or rhinitis.    . furosemide (LASIX) 20 MG tablet Take 20 mg by mouth every other day. PRN    . ipratropium (ATROVENT) 0.06 % nasal spray Place 2 sprays into both nostrils 4 (four) times daily. 15 mL 1  . lisinopril (PRINIVIL,ZESTRIL) 20 MG tablet Take 1 tablet (20 mg total) by mouth daily. 90 tablet 3  . metoprolol tartrate (LOPRESSOR) 25 MG tablet Take 25 mg by mouth 2 (two) times daily.    . naproxen (NAPROSYN) 500 MG tablet Take 1 tablet (500 mg total) by mouth 2 (two) times daily. 30 tablet 0  . omeprazole (PRILOSEC) 40 MG capsule Take 40 mg by mouth daily.    . predniSONE (DELTASONE) 10 MG tablet Take 3 tablets (30 mg total) by mouth daily. 15 tablet 0  . sulfamethoxazole-trimethoprim (BACTRIM DS,SEPTRA DS) 800-160 MG per tablet Take 1 tablet by mouth 2 (two) times daily. 14 tablet 0   No current facility-administered medications for this visit.    LABS/IMAGING: No results found for this or any previous visit (from the past 48 hour(s)). No results found.  VITALS: BP 140/70 mmHg  Pulse 80  Ht 5\' 8"  (1.727 m)  Wt 324 lb 4.8 oz (147.102 kg)  BMI 49.32 kg/m2  LMP 01/13/2014  EXAM: Deferred  EKG: Deferred  ASSESSMENT: 1. Coronary artery disease status post 5 vessel CABG (2015) 2. Morbid obesity 3. Hypertension - uncontrolled 4. Dyslipidemia 5. OSA, not on cPAP 6. Keloid scar 7. Palpitations  PLAN: 1.   Ms. Parilla has been having elevated blood pressures during cardiac rehabilitation. I plan to increase her lisinopril to 20 mg daily. If she continues to have elevated blood pressures remain at low dose HCTZ in combination. She denies any angina and continues to do well with rehabilitation. She should continue with  rehabilitation as I would expect her blood pressures to improve with weight loss.  [name], MD, [facility] Attending Cardiologist [place] C Aleeyah Bensen 04/03/2015, 8:34 AM

## 2015-04-04 ENCOUNTER — Encounter (HOSPITAL_COMMUNITY)
Admission: RE | Admit: 2015-04-04 | Discharge: 2015-04-04 | Disposition: A | Discharge status: Home / Self Care | Payer: Medicare Other | Source: Ambulatory Visit | Attending: Internal Medicine | Admitting: Internal Medicine

## 2015-04-07 ENCOUNTER — Encounter (HOSPITAL_COMMUNITY): Payer: Medicare Other

## 2015-04-08 LAB — HM DIABETES EYE EXAM

## 2015-04-09 ENCOUNTER — Encounter (HOSPITAL_COMMUNITY)
Admission: RE | Admit: 2015-04-09 | Discharge: 2015-04-09 | Disposition: A | Discharge status: Home / Self Care | Payer: Medicare Other | Source: Ambulatory Visit | Attending: Internal Medicine | Admitting: Internal Medicine

## 2015-04-11 ENCOUNTER — Encounter (HOSPITAL_COMMUNITY)
Admission: RE | Admit: 2015-04-11 | Discharge: 2015-04-11 | Disposition: A | Discharge status: Home / Self Care | Payer: Medicare Other | Source: Ambulatory Visit | Attending: Internal Medicine | Admitting: Internal Medicine

## 2015-04-14 ENCOUNTER — Encounter (HOSPITAL_COMMUNITY): Admission: RE | Admit: 2015-04-14 | Payer: Medicare Other | Source: Ambulatory Visit

## 2015-04-14 ENCOUNTER — Telehealth (HOSPITAL_COMMUNITY): Payer: Self-pay | Admitting: *Deleted

## 2015-04-16 ENCOUNTER — Telehealth: Payer: Self-pay | Admitting: Internal Medicine

## 2015-04-16 ENCOUNTER — Encounter (HOSPITAL_COMMUNITY)
Admission: RE | Admit: 2015-04-16 | Discharge: 2015-04-16 | Disposition: A | Discharge status: Home / Self Care | Payer: Medicare Other | Source: Ambulatory Visit | Attending: Internal Medicine | Admitting: Internal Medicine

## 2015-04-16 NOTE — Progress Notes (Signed)
Patient's weight is 150.6 kg today. [name]'s weight has been going up since 04/04/2015.  Oxygen saturation 98% on room air lung fields clear upon ascultation. Trace ankle edema noted. Patient denies shortness of breath. Patient has a PRN for lasix. [name] says she has not taken her PRN in 3 months. Patient instructed to take her PRN lasix today.  Dr [facility] office called and notified spoke with Tracy Sanes RN. No complaints with exercise today. Will continue to monitor the patient throughout  the program.

## 2015-04-16 NOTE — Telephone Encounter (Signed)
Verdis Frederickson called from Cardiac Rehab - advised that pt weight today was 150.6kg on their scale - noted 147.6 when seen in our office.  She states pt has PRN lasix which she has not taken in ~3 months, little peripheral edema, no dyspnea, O2 sats 97%.  Advised use of PRN lasix - no changes indicated o/w - 2 weeks between weights & the fact that weight fluctuation could even be due to scale calibration differences raises no flags. Considering patient is w/o symptoms, no concerns.  She verbalized agreement.

## 2015-04-18 ENCOUNTER — Encounter (HOSPITAL_COMMUNITY)
Admission: RE | Admit: 2015-04-18 | Discharge: 2015-04-18 | Disposition: A | Discharge status: Home / Self Care | Payer: Medicare Other | Source: Ambulatory Visit | Attending: Internal Medicine | Admitting: Internal Medicine

## 2015-04-22 ENCOUNTER — Encounter: Payer: Self-pay | Admitting: Internal Medicine

## 2015-04-23 ENCOUNTER — Encounter (HOSPITAL_COMMUNITY)
Admission: RE | Admit: 2015-04-23 | Discharge: 2015-04-23 | Disposition: A | Discharge status: Home / Self Care | Payer: Medicare Other | Source: Ambulatory Visit | Attending: Internal Medicine | Admitting: Internal Medicine

## 2015-04-23 DIAGNOSIS — Z951 Presence of aortocoronary bypass graft: Secondary | ICD-10-CM | POA: Insufficient documentation

## 2015-04-25 ENCOUNTER — Encounter (HOSPITAL_COMMUNITY)
Admission: RE | Admit: 2015-04-25 | Discharge: 2015-04-25 | Disposition: A | Discharge status: Home / Self Care | Payer: Medicare Other | Source: Ambulatory Visit | Attending: Internal Medicine | Admitting: Internal Medicine

## 2015-04-28 ENCOUNTER — Encounter (HOSPITAL_COMMUNITY)
Admission: RE | Admit: 2015-04-28 | Discharge: 2015-04-28 | Disposition: A | Discharge status: Home / Self Care | Payer: Medicare Other | Source: Ambulatory Visit | Attending: Internal Medicine | Admitting: Internal Medicine

## 2015-04-30 ENCOUNTER — Encounter (HOSPITAL_COMMUNITY): Payer: Medicare Other

## 2015-05-02 ENCOUNTER — Encounter (HOSPITAL_COMMUNITY)
Admission: RE | Admit: 2015-05-02 | Discharge: 2015-05-02 | Disposition: A | Discharge status: Home / Self Care | Payer: Medicare Other | Source: Ambulatory Visit | Attending: Internal Medicine | Admitting: Internal Medicine

## 2015-05-02 DIAGNOSIS — Z951 Presence of aortocoronary bypass graft: Secondary | ICD-10-CM | POA: Diagnosis not present

## 2015-05-02 LAB — GLUCOSE, CAPILLARY
GLUCOSE-CAPILLARY: 77 mg/dL (ref 65–99)
Glucose-Capillary: 118 mg/dL — ABNORMAL HIGH (ref 65–99)

## 2015-05-02 NOTE — Progress Notes (Signed)
`  [name] complained about feeling lightheaded upon getting up to get her exit blood pressure checked. Blood pressure 164/92. Patient assisted to sit down. Sitting blood pressure 122/60. Standing blood pressure 128/92 heart rate 65. CBG 77. Patient given a lemonade and a banana.  Rhonda [name] PAC called and notified. Suanne Marker said to monitor the patient and to see how she feels. Patient reported feeling better. Recheck CBG 118. Patient had no further symptoms. Patient complained of right shoulder soreness. Patient instructed to follow up with her primary care physician regarding this. Patient left with her two grandchildren. Will continue to monitor the patient throughout  the program. Will notify Dr Ellwood Dense of today's event. We will have the dietitian talk to the patient next week.

## 2015-05-05 ENCOUNTER — Encounter (HOSPITAL_COMMUNITY)
Admission: RE | Admit: 2015-05-05 | Discharge: 2015-05-05 | Disposition: A | Discharge status: Home / Self Care | Payer: Medicare Other | Source: Ambulatory Visit | Attending: Internal Medicine | Admitting: Internal Medicine

## 2015-05-07 ENCOUNTER — Encounter (HOSPITAL_COMMUNITY): Payer: Medicare Other

## 2015-05-09 ENCOUNTER — Encounter (HOSPITAL_COMMUNITY)
Admission: RE | Admit: 2015-05-09 | Discharge: 2015-05-09 | Disposition: A | Discharge status: Home / Self Care | Payer: Medicare Other | Source: Ambulatory Visit | Attending: Internal Medicine | Admitting: Internal Medicine

## 2015-05-12 ENCOUNTER — Encounter (HOSPITAL_COMMUNITY)
Admission: RE | Admit: 2015-05-12 | Discharge: 2015-05-12 | Disposition: A | Discharge status: Home / Self Care | Payer: Medicare Other | Source: Ambulatory Visit | Attending: Internal Medicine | Admitting: Internal Medicine

## 2015-05-14 ENCOUNTER — Encounter (HOSPITAL_COMMUNITY): Payer: Medicare Other

## 2015-05-16 ENCOUNTER — Ambulatory Visit (HOSPITAL_COMMUNITY)
Admission: RE | Admit: 2015-05-16 | Discharge: 2015-05-16 | Disposition: A | Discharge status: Home / Self Care | Payer: Medicare Other | Source: Ambulatory Visit | Attending: Internal Medicine | Admitting: Internal Medicine

## 2015-05-16 ENCOUNTER — Encounter (HOSPITAL_COMMUNITY)
Admission: RE | Admit: 2015-05-16 | Discharge: 2015-05-16 | Disposition: A | Discharge status: Home / Self Care | Payer: Medicare Other | Source: Ambulatory Visit | Attending: Internal Medicine | Admitting: Internal Medicine

## 2015-05-16 ENCOUNTER — Encounter: Payer: Self-pay | Admitting: Internal Medicine

## 2015-05-16 ENCOUNTER — Ambulatory Visit (INDEPENDENT_AMBULATORY_CARE_PROVIDER_SITE_OTHER): Payer: Medicare Other | Admitting: Internal Medicine

## 2015-05-16 ENCOUNTER — Other Ambulatory Visit: Payer: Self-pay | Admitting: Internal Medicine

## 2015-05-16 ENCOUNTER — Telehealth: Payer: Self-pay | Admitting: *Deleted

## 2015-05-16 VITALS — BP 142/68 | HR 82 | Temp 98.3°F | Ht 68.0 in | Wt 334.6 lb

## 2015-05-16 DIAGNOSIS — R0789 Other chest pain: Secondary | ICD-10-CM | POA: Insufficient documentation

## 2015-05-16 DIAGNOSIS — Z951 Presence of aortocoronary bypass graft: Secondary | ICD-10-CM | POA: Insufficient documentation

## 2015-05-16 DIAGNOSIS — R079 Chest pain, unspecified: Secondary | ICD-10-CM

## 2015-05-16 DIAGNOSIS — R05 Cough: Secondary | ICD-10-CM

## 2015-05-16 DIAGNOSIS — J069 Acute upper respiratory infection, unspecified: Secondary | ICD-10-CM | POA: Insufficient documentation

## 2015-05-16 DIAGNOSIS — R059 Cough, unspecified: Secondary | ICD-10-CM

## 2015-05-16 DIAGNOSIS — R0602 Shortness of breath: Secondary | ICD-10-CM | POA: Insufficient documentation

## 2015-05-16 DIAGNOSIS — J45909 Unspecified asthma, uncomplicated: Secondary | ICD-10-CM | POA: Diagnosis not present

## 2015-05-16 NOTE — Assessment & Plan Note (Signed)
-   Her EKG is reassuring and this does not sound like UA.  I suspect from history that this could be GERD related although she feels that she may again have bronchitis. - Will have her increase her PPI- Omeprazole to BID for the next week.  I will also check a CXR to evaluate for atypical PNA.

## 2015-05-16 NOTE — Telephone Encounter (Signed)
Dr Ellwood Dense called clinic - cardiac  rehab called Dr Ellwood Dense about pt. Pt denies chest pain. Talked with [name] in cardiac rehab [phone] - pt went to Urgent Care in 03/2015 for bronchitis. Appt for today for FU with Dr Heber Wahkon.  Hilda Blades Shakendra Griffeth RN 05/16/15 12N

## 2015-05-16 NOTE — Patient Instructions (Signed)
General Instructions:  I will get a chest xray for you, you can use the albuterol you have if you feel short of breath.  Try taking the omeprazole twice a day for the next week to see if that improves your symptoms.  Please bring your medicines with you each time you come to clinic.  Medicines may include prescription medications, over-the-counter medications, herbal remedies, eye drops, vitamins, or other pills.   Progress Toward Treatment Goals:  Treatment Goal 12/13/2013  Blood pressure unchanged    Self Care Goals & Plans:  Self Care Goal 05/16/2015  Manage my medications take my medicines as prescribed; bring my medications to every visit; refill my medications on time  Monitor my health keep track of my weight  Eat healthy foods eat foods that are low in salt; eat more vegetables; eat baked foods instead of fried foods  Be physically active find an activity I enjoy  Meeting treatment goals -    No flowsheet data found.   Care Management & Community Referrals:  Referral 11/06/2013  Referrals made for care management support none needed

## 2015-05-16 NOTE — Progress Notes (Signed)
Patient arrived to Cardiac rehab maintenance program for the 11:15am exercise session. During blood pressure check-in she shared that she was not feeling very well and had some chest pain on and off since 3am this morning. She shared that last month she went to Urgent care due to similar discomfort and was treated with prednisone and given an antibiotic for an upper respiratory matter. She reports she feels fine other than when she moves or lays down she feels it in her throat, "feels like something stuck in my throat". BP 144/66 HR NSR 78 SaO2 98% room air. She denies the discomfort is related to activity or during walking. Discomfort primarily experienced during up and down movement or laying down.  Posterior lungs sounds assessed with clear air exchange with inspiration and expiration. However with some expiratory effort she would also cough. She also reported she has noticed sinus drainage and feels she has been impacted by being out in the heat.  Called Dr. Ellwood Dense to discuss. Appointment setup for 2:45pm for further assessment with Clinic Physician. Patient did not exercise today and after assessment at rehab and determination of follow up at Clinic patient left the department escorted via wheel-chair with grand-daughters.

## 2015-05-16 NOTE — Assessment & Plan Note (Signed)
-   Will check CXR, may be related to reflux.

## 2015-05-16 NOTE — Progress Notes (Signed)
Pt has an appt with Dr Heber [place] 2:45PM 05/16/15. Talked with [name] in cardiac rehab - pt denies chest pain. Pt aware of appt today.

## 2015-05-16 NOTE — Progress Notes (Signed)
[name] from Cardiac Rehab called me at my office number and told me that [name] was present for rehab and complained of chest pain the previous night. [name] is not having chest pain currently. I have discussed this RN Hilda Blades in clinic. Clinic will triage [name] and arrange for a slot if needed, or refer her to ER.

## 2015-05-16 NOTE — Progress Notes (Signed)
[facility] Subjective:   Patient ID: [name] female   DOB: [birth year] 59 y.o.   MRN: [id number]  HPI: [name] is a 59 y.o. female with a PMH detailed below who presents for an acute visit for evaluation of a cough and chest pain. She has been undergoing cardiac rehab after a recent 5 vessel CABG. Se presented this morning to rehab and noted not feeling well with discomfort in her throat, cough, and some chest pain. She reports the chest pain has been intermittent, not associated with eating, starting in the center of her chest and goes up to her throat.  She does noticed an occasional bad taste in her mouth and yesterday she had some painful swallowing.  She does admit a mild nonproductive cough also present since this morning.    Past Medical History  Diagnosis Date  . Seasonal allergies   . Asthma     History of  . Hypertension   . Iron deficiency 12-07-2011  . Anemia   . GERD (gastroesophageal reflux disease)   . Heart murmur   . Osteoporosis   . PONV (postoperative nausea and vomiting)   . Dysrhythmia     occ palpitations   . Sleep apnea     no CPAP machine; sleep study 02/2010 REM AHI 61.7/hr, total sleep REM 14.8/hr  . Headache(784.0)     occasional   . Osteoarthritis   . Morbid obesity   . Acute urinary retention s/p Foley 01/07/2012  . Hemorrhoids, internal, with bleeding & prolapse 12/13/2011  . Myocardial infarction     unsure when; per cardiologist report  . Chronic headaches   . Coronary artery disease   . Anginal pain   . Dyslipidemia   . S/P CABG (coronary artery bypass graft)     x5 - LIMA to LAD, SVG to diagonal, SVG to OM1, SVG to OM2, SVG to PDA (Dr. Prescott Gum)  . Postmenopausal bleeding   . Neuropathy   . Blood dyscrasia     ABNORMAL VAGINAL BLEEDING  . Uterine cancer 7/15    clinical stage IA grade 1 endometrioid endometrial cancer   Current Outpatient Prescriptions  Medication Sig Dispense Refill  .  omeprazole (PRILOSEC) 40 MG capsule Take 40 mg by mouth daily.    Marland Kitchen albuterol (PROVENTIL HFA;VENTOLIN HFA) 108 (90 BASE) MCG/ACT inhaler Inhale 2 puffs into the lungs every 6 (six) hours as needed for wheezing or shortness of breath. 1 Inhaler 2  . aspirin 325 MG tablet Take 162.5 mg by mouth daily.    . CRESTOR 20 MG tablet TAKE 1 TABLET (20 MG TOTAL) BY MOUTH DAILY. 30 tablet 5  . CVS SENNA PLUS 8.6-50 MG per tablet Take 1 tablet by mouth at bedtime as needed (constipation).   5  . cycloSPORINE (RESTASIS) 0.05 % ophthalmic emulsion Place 1 drop into both eyes 2 (two) times daily. 0.4 mL 2  . diclofenac sodium (VOLTAREN) 1 % GEL Apply 2 g topically 2 (two) times daily as needed (joint pain).    . ferrous sulfate 325 (65 FE) MG tablet Take 1 tablet (325 mg total) by mouth 3 (three) times daily with meals. 90 tablet 3  . FLUOCINOLONE ACETONIDE OT Place 1 drop in ear(s) daily as needed.    . fluticasone (FLONASE) 50 MCG/ACT nasal spray Place 1 spray into both nostrils daily as needed for allergies or rhinitis.    . furosemide (LASIX) 20 MG tablet Take 20 mg by mouth  every other day. PRN    . ipratropium (ATROVENT) 0.06 % nasal spray Place 2 sprays into both nostrils 4 (four) times daily. 15 mL 1  . lisinopril (PRINIVIL,ZESTRIL) 20 MG tablet Take 1 tablet (20 mg total) by mouth daily. 90 tablet 3  . metoprolol tartrate (LOPRESSOR) 25 MG tablet Take 25 mg by mouth 2 (two) times daily.    . naproxen (NAPROSYN) 500 MG tablet Take 1 tablet (500 mg total) by mouth 2 (two) times daily. (Patient not taking: Reported on 04/16/2015) 30 tablet 0   No current facility-administered medications for this visit.   Family History  Problem Relation Age of Onset  . Colon cancer Neg Hx   . Hypertension Sister   . Heart failure Sister   . Heart disease Sister   . Diabetes type II Sister   . Kidney disease Brother     hypertension  . Heart attack Mother 18  . Heart disease Mother   . Hypertension Mother   .  Diabetes Mother   . Hypertension Child    History   Social History  . Marital Status: Single    Spouse Name: N/A  . Number of Children: 2  . Years of Education: N/A   Occupational History  . Retired    Social History Main Topics  . Smoking status: Never Smoker   . Smokeless tobacco: Never Used  . Alcohol Use: No  . Drug Use: No  . Sexual Activity: Not Currently   Other Topics Concern  . None   Social History Narrative   Lives alone in a one story home.  Has 2 children.     Retired Regulatory affairs officer.     Education: 2 years of college.   Review of Systems: Review of Systems  Constitutional: Negative for fever and chills.  Eyes: Negative for blurred vision.  Respiratory: Negative for cough and shortness of breath.   Cardiovascular: Positive for chest pain. Negative for leg swelling.  Gastrointestinal: Positive for abdominal pain. Negative for heartburn, nausea and vomiting.  Musculoskeletal: Negative for myalgias.  Skin: Negative for rash.  Neurological: Negative for dizziness and headaches.  Psychiatric/Behavioral: Negative for depression.     Objective:  Physical Exam: Filed Vitals:   05/16/15 1430 05/16/15 1526  BP: 163/105 142/68  Pulse: 94 82  Temp: 98.3 F (36.8 C)   TempSrc: Oral   Height: 5\' 8"  (1.727 m)   Weight: 334 lb 9.6 oz (151.774 kg)   SpO2: 99%   Physical Exam  Constitutional: She is well-developed, well-nourished, and in no distress.  Morbidly obese  Cardiovascular: Normal rate, regular rhythm, normal heart sounds and intact distal pulses.   Pulmonary/Chest: Effort normal and breath sounds normal. No respiratory distress. She has no wheezes. She has no rales.  No tenderness of chest wall  Abdominal: Soft. Bowel sounds are normal. She exhibits no distension. There is no tenderness.  Musculoskeletal: She exhibits no edema.  Nursing note and vitals reviewed.   Assessment & Plan:  Case discussed with Dr. Dareen Piano   EKG: NSR, unchanged from previous  in April of this year.  Chest pain - Her EKG is reassuring and this does not sound like UA.  I suspect from history that this could be GERD related although she feels that she may again have bronchitis. - Will have her increase her PPI- Omeprazole to BID for the next week.  I will also check a CXR to evaluate for atypical PNA.  Cough - Will check CXR, may be  related to reflux.    Medications Ordered No orders of the defined types were placed in this encounter.   Other Orders Orders Placed This Encounter  Procedures  . DG Chest 2 View    Standing Status: Future     Number of Occurrences: 1     Standing Expiration Date: 07/15/2016    Order Specific Question:  Reason for Exam (SYMPTOM  OR DIAGNOSIS REQUIRED)    Answer:  cough, chest pain x1 day    Order Specific Question:  Is the patient pregnant?    Answer:  No    Order Specific Question:  Preferred imaging location?    Answer:  [facility]  . EKG 12-Lead   Follow Up: Return 1-2 weeks or earlier if symptoms worsen or fail to improve.

## 2015-05-19 ENCOUNTER — Encounter (HOSPITAL_COMMUNITY): Admission: RE | Admit: 2015-05-19 | Payer: Medicare Other | Source: Ambulatory Visit

## 2015-05-20 NOTE — Progress Notes (Signed)
INTERNAL MEDICINE TEACHING ATTENDING ADDENDUM - Denver Bentson, MD: I reviewed and discussed at the time of visit with the resident Dr. Hoffman, the patient's medical history, physical examination, diagnosis and results of pertinent tests and treatment and I agree with the patient's care as documented.  

## 2015-05-21 ENCOUNTER — Encounter (HOSPITAL_COMMUNITY)
Admission: RE | Admit: 2015-05-21 | Discharge: 2015-05-21 | Disposition: A | Discharge status: Home / Self Care | Payer: Medicare Other | Source: Ambulatory Visit | Attending: Internal Medicine | Admitting: Internal Medicine

## 2015-05-23 ENCOUNTER — Encounter (HOSPITAL_COMMUNITY)
Admission: RE | Admit: 2015-05-23 | Discharge: 2015-05-23 | Disposition: A | Discharge status: Home / Self Care | Payer: Self-pay | Source: Ambulatory Visit | Attending: Internal Medicine | Admitting: Internal Medicine

## 2015-05-23 DIAGNOSIS — Z951 Presence of aortocoronary bypass graft: Secondary | ICD-10-CM | POA: Diagnosis not present

## 2015-05-28 ENCOUNTER — Encounter (HOSPITAL_COMMUNITY)
Admission: RE | Admit: 2015-05-28 | Discharge: 2015-05-28 | Disposition: A | Discharge status: Home / Self Care | Payer: Self-pay | Source: Ambulatory Visit | Attending: Internal Medicine | Admitting: Internal Medicine

## 2015-05-29 ENCOUNTER — Encounter: Payer: Self-pay | Admitting: Internal Medicine

## 2015-05-30 ENCOUNTER — Encounter: Payer: Self-pay | Admitting: Obstetrics & Gynecology

## 2015-05-30 ENCOUNTER — Encounter (HOSPITAL_COMMUNITY)
Admission: RE | Admit: 2015-05-30 | Discharge: 2015-05-30 | Disposition: A | Discharge status: Home / Self Care | Payer: Self-pay | Source: Ambulatory Visit | Attending: Internal Medicine | Admitting: Internal Medicine

## 2015-05-30 ENCOUNTER — Ambulatory Visit (INDEPENDENT_AMBULATORY_CARE_PROVIDER_SITE_OTHER): Payer: Medicare Other | Admitting: Obstetrics & Gynecology

## 2015-05-30 VITALS — BP 140/78 | HR 66 | Ht 68.0 in | Wt 328.3 lb

## 2015-05-30 DIAGNOSIS — Z08 Encounter for follow-up examination after completed treatment for malignant neoplasm: Secondary | ICD-10-CM

## 2015-05-30 DIAGNOSIS — Z8542 Personal history of malignant neoplasm of other parts of uterus: Secondary | ICD-10-CM | POA: Diagnosis not present

## 2015-05-30 NOTE — Patient Instructions (Signed)
Uterine Cancer Uterine cancer is an abnormal growth of tissue (tumor) in the uterus that is cancerous (malignant). Unlike noncancerous (benign) tumors, malignant tumors can spread to other parts of your body. The wall of the uterus has two layers of tissue. The inner layer is the endometrium. The outer layer of muscle tissue is the myometrium. The most common type of uterine cancer begins in the endometrium. This is called endometrial cancer. Cancer that begins in the myometrium is called uterine sarcoma, which is very rare.  RISK FACTORS  Although the exact cause of uterine cancer is unknown, there are a number of risk factors that can increase your chances of getting uterine cancer. They include:  Your age. Uterine cancer occurs mostly in women older than 50 years.   Having an enlarged endometrium (endometrial hyperplasia).   Using hormone therapy.   Obesity.   Taking the drug tamoxifen.   White race.   Infertility.   Never being pregnant.   Beginning menstrual periods at an age younger than 12 years.   Having menstrual periods at an age older than 4 years.   Personal history of ovarian, intestinal, or colorectal cancer.   Having a family history of uterine cancer.   Having a family history of hereditary nonpolyposis colon cancer (HNPCC).   Having diabetes, high blood pressure, thyroid disease, or gallbladder disease.   Long-term use of high-dose birth control pills.   Exposure to radiation.   Smoking.  SIGNS AND SYMPTOMS   Abnormal vaginal bleeding or discharge. Bleeding may start as a watery, blood-streaked flow that gradually contains more blood.   Any vaginal bleeding after menopause.   Difficult or painful urination.   Pain during intercourse.   Pain in the pelvic area.  Mass in the vagina.  Pain or fullness in the abdomen.  Frequent urination.  Bleeding between periods.  Growth of the stomach.   Unexplained weight loss.   Uterine cancer usually occurs after menopause. However, it may also occur around the time that menopause begins. Abnormal vaginal bleeding is the most common symptom of uterine cancer. Women should not assume that abnormal vaginal bleeding is part of menopause. DIAGNOSIS  Your health care provider will ask about your medical history. He or she may also perform a number of procedures, such as:  A physical and pelvic exam. Your health care provider will feel your pelvis for any lumps.   Blood and urine tests.   X-rays.   Imaging tests, such as CT scans, ultrasonography, or MRIs.   A hysteroscopy to view the inside of your uterus.   A Pap test to sample cells from the cervix and upper vagina to check for abnormal cells.   Taking a tissue sample (biopsy) from the uterine lining to look for cancer cells.   A dilation and curettage (D&C). This involves stretching (dilation) the cervix and scraping (curettage) the inside lining of the uterus to get a tissue sample. The sample is examined under a microscope to look for cancer cells.  Your cancer will be staged to determine its severity and extent. Staging is a careful attempt to find out the size of the tumor, whether the cancer has spread, and if so, to what parts of the body. You may need to have more tests to determine the stage of your cancer. The test results will help determine what treatment plan is best for you. Cancer stages include:   Stage I. The cancer is only found in the uterus.  Stage II. The  cancer has spread to the cervix.  Stage III. The cancer has spread outside the uterus, but not outside the pelvis. The cancer may have spread to the lymph nodes in the pelvis.  Stage IV. The cancer has spread to other parts of the body, such as the bladder or rectum. TREATMENT  Most women with uterine cancer are treated with surgery. This includes removing the uterus, cervix, fallopian tubes, and ovaries (total hysterectomy). Your  lymph nodes near the tumor may also be removed. Some women have radiation, chemotherapy, or hormonal therapy. Other women have a combination of these therapies. HOME CARE INSTRUCTIONS   Take medicines only as directed by your health care provider.   Maintain a healthy diet.  Exercise regularly.   If you have diabetes, high blood pressure, thyroid disease, or gallbladder disease, follow your health care provider's instructions to keep it under control.   Do not smoke.   Consider joining a support group. This may help you learn to cope with the stress of having uterine cancer.   Seek advice to help you manage treatment side effects.   Keep all follow-up visits as directed by your health care provider.  SEEK MEDICAL CARE IF:  You have increased stomach or pelvic pain.  You cannot urinate.  You have abnormal bleeding. Document Released: 11/08/2005 Document Revised: 03/25/2014 Document Reviewed: 04/27/2013 [facility] Patient Information 2015 [place], [place]. This information is not intended to replace advice given to you by your health care provider. Make sure you discuss any questions you have with your health care provider.

## 2015-05-30 NOTE — Progress Notes (Signed)
Patient ID: [name], female   DOB: May 09, 1956, 59 y.o.   MRN: [id number] History:  59 y.o. [id number] here today for surveillance of Stage I endometrial cancer.  She denies pain or weight loss. NO other constitutional sx noted.  She reports that after 21 years she was recently sexually active. She wanted to be checked to make she 'everything was ok'    The following portions of the patient's history were reviewed and updated as appropriate: allergies, current medications, past family history, past medical history, past social history, past surgical history and problem list.  Review of Systems:  Pertinent items are noted in HPI.  Objective:  Physical Exam Blood pressure 140/78, pulse 66, height 5\' 8"  (1.727 m), weight 328 lb 4.8 oz (148.916 kg), last menstrual period 01/13/2014. Gen: NAD Abd: Soft, nontender and nondistended Pelvic: Normal appearing external genitalia; normal appearing vaginal mucosa and vaginal cuff.  No adnexal masses or masses at cuff.  Labs and Imaging  02/2015  CLINICAL DATA: Screening.  EXAM: DIGITAL SCREENING BILATERAL MAMMOGRAM WITH CAD  COMPARISON: Previous exam(s).  ACR Breast Density Category a: The breast tissue is almost entirely fatty.  FINDINGS: There are no findings suspicious for malignancy. Images were processed with CAD.  IMPRESSION: No mammographic evidence of malignancy. A result letter of this screening mammogram will be mailed directly to the patient.  RECOMMENDATION: Screening mammogram in one year. (Code:SM-B-01Y)  BI-RADS CATEGORY 1: Negative.     06/2014 CLINICAL DATA: Endometrial cancer, status post hysterectomy, evaluate for metastases  EXAM: CT ABDOMEN AND PELVIS WITH CONTRAST  TECHNIQUE: Multidetector CT imaging of the abdomen and pelvis was performed using the standard protocol following bolus administration of intravenous contrast.  CONTRAST: 1109mL OMNIPAQUE IOHEXOL 300 MG/ML  SOLN  COMPARISON: None.  FINDINGS: Lung bases are clear.  Liver, spleen, pancreas, and adrenal glands are within normal limits.  Gallbladder is unremarkable. No intrahepatic or extrahepatic ductal dilatation.  Kidneys are unremarkable. No hydronephrosis.  No evidence of bowel obstruction. Normal appendix.  Atherosclerotic calcifications of the abdominal aorta and branch vessels.  No abdominopelvic ascites.  Status post hysterectomy. 14 x 18 mm thin-walled fluid collection along the right vaginal cuff (series 2/image 34), likely reflecting a small postoperative seroma.  Bilateral ovaries are grossly unremarkable.  Small retroperitoneal lymph nodes measuring up to 8 mm short axis (series 2/image 48), within normal limits.  Bladder is notable for layering contrast, within normal limits.  Degenerative changes of the visualized thoracolumbar spine. Superior endplate Schmorl node at L1.  IMPRESSION: Status post hysterectomy.  No findings suspicious for metastatic disease in the abdomen/pelvis.  14 x 18 mm fluid collection along the right vaginal cuff, likely reflecting a small postoperative seroma.     Assessment & Plan:  Stage I endometrioid cancer- doing well  F/u every 6 months for 5 years CT for abnormal pelvic sx- ot has none at present

## 2015-06-02 ENCOUNTER — Encounter (HOSPITAL_COMMUNITY)
Admission: RE | Admit: 2015-06-02 | Discharge: 2015-06-02 | Disposition: A | Discharge status: Home / Self Care | Payer: Self-pay | Source: Ambulatory Visit | Attending: Internal Medicine | Admitting: Internal Medicine

## 2015-06-04 ENCOUNTER — Encounter (HOSPITAL_COMMUNITY): Payer: Self-pay

## 2015-06-06 ENCOUNTER — Encounter (HOSPITAL_COMMUNITY): Admission: RE | Admit: 2015-06-06 | Payer: Self-pay | Source: Ambulatory Visit

## 2015-06-06 ENCOUNTER — Telehealth (HOSPITAL_COMMUNITY): Payer: Self-pay | Admitting: *Deleted

## 2015-06-07 ENCOUNTER — Other Ambulatory Visit: Payer: Self-pay | Admitting: Internal Medicine

## 2015-06-09 ENCOUNTER — Encounter (HOSPITAL_COMMUNITY): Payer: Self-pay

## 2015-06-10 ENCOUNTER — Other Ambulatory Visit: Payer: Self-pay | Admitting: Internal Medicine

## 2015-06-11 ENCOUNTER — Encounter (HOSPITAL_COMMUNITY): Payer: Self-pay

## 2015-06-13 ENCOUNTER — Encounter (HOSPITAL_COMMUNITY): Payer: Self-pay

## 2015-06-16 ENCOUNTER — Encounter (HOSPITAL_COMMUNITY)
Admission: RE | Admit: 2015-06-16 | Discharge: 2015-06-16 | Disposition: A | Discharge status: Home / Self Care | Payer: Self-pay | Source: Ambulatory Visit | Attending: Internal Medicine | Admitting: Internal Medicine

## 2015-06-18 ENCOUNTER — Encounter (HOSPITAL_COMMUNITY)
Admission: RE | Admit: 2015-06-18 | Discharge: 2015-06-18 | Disposition: A | Discharge status: Home / Self Care | Payer: Self-pay | Source: Ambulatory Visit | Attending: Internal Medicine | Admitting: Internal Medicine

## 2015-06-19 ENCOUNTER — Ambulatory Visit (INDEPENDENT_AMBULATORY_CARE_PROVIDER_SITE_OTHER): Payer: Medicare Other | Admitting: Internal Medicine

## 2015-06-19 VITALS — BP 157/90 | HR 76 | Ht 68.0 in | Wt 334.3 lb

## 2015-06-19 DIAGNOSIS — E785 Hyperlipidemia, unspecified: Secondary | ICD-10-CM

## 2015-06-19 DIAGNOSIS — I2510 Atherosclerotic heart disease of native coronary artery without angina pectoris: Secondary | ICD-10-CM

## 2015-06-19 DIAGNOSIS — I10 Essential (primary) hypertension: Secondary | ICD-10-CM

## 2015-06-19 DIAGNOSIS — Z951 Presence of aortocoronary bypass graft: Secondary | ICD-10-CM | POA: Diagnosis not present

## 2015-06-19 MED ORDER — IRBESARTAN 150 MG PO TABS: 150.0000 mg | ORAL_TABLET | Freq: Every day | ORAL | Status: DC

## 2015-06-19 NOTE — Patient Instructions (Signed)
Your physician wants you to follow-up in: 6 months with Dr. Debara Pickett. You will receive a reminder letter in the mail two months in advance. If you don't receive a letter, please call our office to schedule the follow-up appointment.  Your physician has recommended you make the following change in your medication...  1. STOP lisinopril 2. START irbesartan 150mg  daily  [facility] - varicose vein [place]

## 2015-06-20 ENCOUNTER — Encounter: Payer: Self-pay | Admitting: Internal Medicine

## 2015-06-20 ENCOUNTER — Encounter (HOSPITAL_COMMUNITY)
Admission: RE | Admit: 2015-06-20 | Discharge: 2015-06-20 | Disposition: A | Discharge status: Home / Self Care | Payer: Self-pay | Source: Ambulatory Visit | Attending: Internal Medicine | Admitting: Internal Medicine

## 2015-06-20 DIAGNOSIS — I700 Atherosclerosis of aorta: Secondary | ICD-10-CM | POA: Insufficient documentation

## 2015-06-20 NOTE — Progress Notes (Signed)
OFFICE NOTE  Chief Complaint:  Hypertension follow-up  Primary Care Physician: Madilyn Fireman, MD  HPI:  [name] is a 59 year old African American obese female presented to 01/01/14 with symptoms of unstable angina. I had seen her once in 2012, but not since then.  She recently had been having chest pain and presented urgently when she became short of breath at home and had chest tightness, which started as right arm pain and right chest pain, then moved substernally. She ultimately underwent cardiac catheterization via right radial artery which demonstrated severe three-vessel CAD with graftable target vessels. Echocardiogram showed good LV function with some left ventricular hypertrophy from her hypertension and there does not appear to be significant valve disease. Carotid dopplers showed a 1-39% bilateral carotid artery stenosis. She underwent a CABG x 5 on 01/09/2014. Left internal mammary artery to LAD, saphenous vein graft to diagonal, sequential saphenous vein graft to OM1 and OM2, saphenous vein graft to posterior descending. She had multiple teeth extractions prior to surgery. She did well post op. She was anemic but did not need transfusion. At discharge she went to SNF for rehab-she did not stay the recommended time frame due to BR not being available when she needed it. She was not given discharge medications when she left SNF. She saw Cecilie Kicks, FNP, in followup for her hospitalization and was restarted on some of her medications.   [name] was seen today in the office for follow-up. At her last office visit I cleared her for surgery and she underwent hysterectomy and has had resolution of her bleeding problems. She is now contemplating right knee surgery and also breast reduction surgery. She's in the interim seen in the hospital for palpitations. She reports those have improved with medication adjustment. She's decreased her Lasix now to every other day and I think she  can probably take it as needed. She denies any chest pain worsening shortness of breath. She is undergoing injections to reduce the size of her keloid scar.  I saw [name] back in the office today. She's been undergoing rehabilitation and really wants to continue it. She's managed to lose some weight and feels better. Recently though she's been having increased blood pressures. Does have ranged up to 270 systolic. She had a small increase in her lisinopril to 10 mg daily with an improvement however this staff does not want to continue her at rehabilitation until her blood pressure is better controlled. She denies any chest pain or palpitations.  [name] returns today for follow-up. She continues in cardiac rehabilitation and is doing well. At his last office visit I increased her lisinopril to 20 mg daily and her blood pressure is now much better controlled. Overall she is feeling well. She is undergoing cesarean injections of the keloid scar that she has in her bypass site. She is concerned about a dilated varicose vein that she's developed at the inferior margin.  PMHx:  Past Medical History  Diagnosis Date  . Seasonal allergies   . Asthma     History of  . Hypertension   . Iron deficiency 12-07-2011  . Anemia   . GERD (gastroesophageal reflux disease)   . Heart murmur   . Osteoporosis   . PONV (postoperative nausea and vomiting)   . Dysrhythmia     occ palpitations   . Sleep apnea     no CPAP machine; sleep study 02/2010 REM AHI 61.7/hr, total sleep REM 14.8/hr  . Headache(784.0)  occasional   . Osteoarthritis   . Morbid obesity   . Acute urinary retention s/p Foley 01/07/2012  . Hemorrhoids, internal, with bleeding & prolapse 12/13/2011  . Myocardial infarction     unsure when; per cardiologist report  . Chronic headaches   . Coronary artery disease   . Anginal pain   . Dyslipidemia   . S/P CABG (coronary artery bypass graft)     x5 - LIMA to LAD, SVG to diagonal, SVG to  OM1, SVG to OM2, SVG to PDA (Dr. Prescott Gum)  . Postmenopausal bleeding   . Neuropathy   . Blood dyscrasia     ABNORMAL VAGINAL BLEEDING  . Uterine cancer 7/15    clinical stage IA grade 1 endometrioid endometrial cancer    Past Surgical History  Procedure Laterality Date  . Knee surgery Bilateral 1999  . Total knee arthroplasty Left 04/29/2011  . Multiple tooth extractions    . Wisdom tooth extraction    . Colonoscopy  2009  . Knee arthroscopy  1991  . Transanal hemorrhoidal dearterliaization  01/06/12    with external hemorrhoid removal  . Coronary artery bypass graft N/A 01/09/2014    Procedure: CORONARY ARTERY BYPASS GRAFTING (CABG) x 5 using left internal mammary artery and right leg greater saphenous vein harvested endoscopically;  Surgeon: Ivin Poot, MD;  Location: [place];  Service: Open Heart Surgery;  Laterality: N/A;  please use bed extenders and breast binder  . Intraoperative transesophageal echocardiogram N/A 01/09/2014    Procedure: INTRAOPERATIVE TRANSESOPHAGEAL ECHOCARDIOGRAM;  Surgeon: Ivin Poot, MD;  Location: [place];  Service: Open Heart Surgery;  Laterality: N/A;  . Tee without cardioversion  01/2010    EF 60-65%, small, flat, non-infiltrating, calcified, fixed apical/septal mass  . Nm myocar perf wall motion  01/2010    dipyridamole myoview - moderate perfusion defect in basal inferoseptal, basal inferior, mid inferoseptal, mid inferior, apical inferior region; EF 56%  . Cardiac catheterization    . Multiple extractions with alveoloplasty N/A 04/11/2014    Procedure: Extraction of tooth #'s 1,2,8,16 with alveoloplasty, maxillary tuberosity reductions, and gross debridement of remaining teeth.;  Surgeon: Lenn Cal, DDS;  Location: [place];  Service: Oral Surgery;  Laterality: N/A;  . Vaginal hysterectomy N/A 06/03/2014    Procedure: HYSTERECTOMY VAGINAL;  Surgeon: Lavonia Drafts, MD;  Location: [facility];  Service: Gynecology;  Laterality: N/A;  .  Unilateral salpingectomy Right 06/03/2014    Procedure: UNILATERAL SALPINGECTOMY;  Surgeon: Lavonia Drafts, MD;  Location: [facility];  Service: Gynecology;  Laterality: Right;  . Left heart catheterization with coronary angiogram N/A 01/04/2014    Procedure: LEFT HEART CATHETERIZATION WITH CORONARY ANGIOGRAM;  Surgeon: Sinclair Grooms, MD;  Location[facility];  Service: Cardiovascular;  Laterality: N/A;    FAMHx:  Family History  Problem Relation Age of Onset  . Colon cancer Neg Hx   . Hypertension Sister   . Heart failure Sister   . Heart disease Sister   . Diabetes type II Sister   . Kidney disease Brother     hypertension  . Heart attack Mother 62  . Heart disease Mother   . Hypertension Mother   . Diabetes Mother   . Hypertension Child     SOCHx:   reports that she has never smoked. She has never used smokeless tobacco. She reports that she does not drink alcohol or use illicit drugs.  ALLERGIES:  Allergies  Allergen Reactions  . Cephalosporins Anaphylaxis  Tongue swelling, gum pain  . Fish-Derived Products Shortness Of Breath  . Peanuts [Peanut Oil] Shortness Of Breath    Peanut butter-   . Codeine Other (See Comments)    hallucinations  . Latex Itching  . Ciprofloxacin   . Lisinopril Cough  . Naproxen Nausea Only    Head ahce  . Penicillins Other (See Comments)    Childhood allergy but pt thinks she is no longer allergic  . Prednisone Other (See Comments)    Heart beating fast       ROS: A comprehensive review of systems was negative except for: Integument/breast: positive for Keloid scar with varicose vein  HOME MEDS: Current Outpatient Prescriptions  Medication Sig Dispense Refill  . albuterol (PROVENTIL HFA;VENTOLIN HFA) 108 (90 BASE) MCG/ACT inhaler Inhale 2 puffs into the lungs every 6 (six) hours as needed for wheezing or shortness of breath. 1 Inhaler 2  . aspirin 325 MG tablet Take 162.5 mg by mouth daily.    . CRESTOR 20 MG tablet  TAKE 1 TABLET BY MOUTH EVERY DAY 30 tablet 0  . [employer] SENNA PLUS 8.6-50 MG per tablet Take 1 tablet by mouth at bedtime as needed (constipation).   5  . cycloSPORINE (RESTASIS) 0.05 % ophthalmic emulsion Place 1 drop into both eyes 2 (two) times daily. 0.4 mL 2  . diclofenac sodium (VOLTAREN) 1 % GEL Apply 2 g topically 2 (two) times daily as needed (joint pain).    Marland Kitchen esomeprazole (NEXIUM) 40 MG capsule TAKE ONE CAPSULE BY MOUTH EVERY DAY 30 capsule 3  . ferrous sulfate 325 (65 FE) MG tablet Take 1 tablet (325 mg total) by mouth 3 (three) times daily with meals. 90 tablet 3  . FLUOCINOLONE ACETONIDE OT Place 1 drop in ear(s) daily as needed.    . fluticasone (FLONASE) 50 MCG/ACT nasal spray Place 1 spray into both nostrils daily as needed for allergies or rhinitis.    . furosemide (LASIX) 20 MG tablet Take 20 mg by mouth every other day. PRN    . ipratropium (ATROVENT) 0.06 % nasal spray Place 2 sprays into both nostrils 4 (four) times daily. 15 mL 1  . metoprolol tartrate (LOPRESSOR) 25 MG tablet Take 25 mg by mouth 2 (two) times daily.    Marland Kitchen omeprazole (PRILOSEC) 40 MG capsule Take 40 mg by mouth daily.    . irbesartan (AVAPRO) 150 MG tablet Take 1 tablet (150 mg total) by mouth daily. 30 tablet 11   No current facility-administered medications for this visit.    LABS/IMAGING: No results found for this or any previous visit (from the past 48 hour(s)). No results found.  VITALS: BP 157/90 mmHg  Pulse 76  Ht 5\' 8"  (1.727 m)  Wt 334 lb 4.8 oz (151.637 kg)  BMI 50.84 kg/m2  LMP 01/13/2014  EXAM: Skin: Keloid scar of the midsternal incision site with a dilated varicose vein at the distal aspect  EKG: Deferred  ASSESSMENT: 1. Coronary artery disease status post 5 vessel CABG (2015) 2. Morbid obesity 3. Hypertension - uncontrolled 4. Dyslipidemia 5. OSA, not on cPAP 6. Keloid scar 7. Palpitations  PLAN: 1.   Ms. Trimmer has improved blood pressure control on increased dose  lisinopril. She seems to be doing well with cardiac rehabilitation, but is not started to lose weight. She needs to continue to work on exercise and perhaps increase her exercise ability. She's concerned about a varicose vein which is noted at the inferior aspect of her keloid scar.  She is undergoing sterile injections by dermatologist. I asked her to talk with her dermatologist as they may be able to sclerose or treat this with laser therapy. If they do not handle that then she could be referred to [facility] who could probably sclerose that vessel.  Plan to see her back in 6 months  [name], MD, [facility] Attending Cardiologist [place] 06/20/2015, 10:33 AM

## 2015-06-23 ENCOUNTER — Encounter (HOSPITAL_COMMUNITY)
Admission: RE | Admit: 2015-06-23 | Discharge: 2015-06-23 | Disposition: A | Discharge status: Home / Self Care | Payer: Self-pay | Source: Ambulatory Visit | Attending: Internal Medicine | Admitting: Internal Medicine

## 2015-06-23 DIAGNOSIS — Z951 Presence of aortocoronary bypass graft: Secondary | ICD-10-CM | POA: Insufficient documentation

## 2015-06-25 ENCOUNTER — Encounter (HOSPITAL_COMMUNITY): Payer: Self-pay

## 2015-06-26 ENCOUNTER — Ambulatory Visit (INDEPENDENT_AMBULATORY_CARE_PROVIDER_SITE_OTHER): Payer: Medicare Other | Admitting: Internal Medicine

## 2015-06-26 ENCOUNTER — Encounter: Payer: Self-pay | Admitting: Internal Medicine

## 2015-06-26 VITALS — BP 160/74 | HR 76 | Temp 98.3°F | Ht 68.0 in | Wt 338.1 lb

## 2015-06-26 DIAGNOSIS — J069 Acute upper respiratory infection, unspecified: Secondary | ICD-10-CM

## 2015-06-26 DIAGNOSIS — R300 Dysuria: Secondary | ICD-10-CM | POA: Diagnosis not present

## 2015-06-26 DIAGNOSIS — I10 Essential (primary) hypertension: Secondary | ICD-10-CM

## 2015-06-26 DIAGNOSIS — J302 Other seasonal allergic rhinitis: Secondary | ICD-10-CM

## 2015-06-26 MED ORDER — GUAIFENESIN-DM 100-10 MG/5ML PO SYRP: 5.0000 mL | ORAL_SOLUTION | Freq: Four times a day (QID) | ORAL | Status: DC | PRN

## 2015-06-26 NOTE — Assessment & Plan Note (Signed)
-  cont flonase 2 sprays once daily -saline nasal spray 4-6 x/day

## 2015-06-26 NOTE — Assessment & Plan Note (Addendum)
Pt p/w 4 days of sore throat, right lymphadenopathy, fatigue, productive cough, HA, rhinorrhea, myalgias, slight nausea, decreased appetite.  Denies fever/chills.  Reports granddaughter is sick with similar symptoms.  Attends cardiac rehab and became sick Monday after she got home.  Exam unremarkable.  Likely viral URI given constellation of s/s.  Influenza less likely as pt is afebrile and reports her myalgias began when she was at cardiac rehab.   -will tx symptomatically -tylenol PRN pain  -can try robitussin for cough   ADDENDUM: Called pt back on 8/5 and reports she is about the same.  She denied fevers currently but stated she took some aspirin.  I offered tamiflu and she would like to try it stating it helped her previously.  Of note, she did receive the influenza vaccine in 2015.  I seriously doubt she has the flu but will give her 5 days of tamiflu at her request.

## 2015-06-26 NOTE — Patient Instructions (Signed)
Thank you for your visit today.   Please return to the internal medicine clinic on 8/18 with Dr. Ellwood Dense.     I have made the following additions/changes to your medications: None  You may take robitussin DM for cough which you can purchase over the counter.   Please use flonase spray once daily. You should also use some nasal saline spray 4-6 times daily and before to using flonase.    Please be sure to bring all of your medications with you to every visit; this includes herbal supplements, vitamins, eye drops, and any over-the-counter medications.   Should you have any questions regarding your medications and/or any new or worsening symptoms, please be sure to call the clinic at [phone].   If you believe that you are suffering from a life threatening condition or one that may result in the loss of limb or function, then you should call 911 or proceed to the nearest Emergency Department.   Upper Respiratory Infection, Adult An upper respiratory infection (URI) is also sometimes known as the common cold. The upper respiratory tract includes the nose, sinuses, throat, trachea, and bronchi. Bronchi are the airways leading to the lungs. Most people improve within 1 week, but symptoms can last up to 2 weeks. A residual cough may last even longer.  CAUSES Many different viruses can infect the tissues lining the upper respiratory tract. The tissues become irritated and inflamed and often become very moist. Mucus production is also common. A cold is contagious. You can easily spread the virus to others by oral contact. This includes kissing, sharing a glass, coughing, or sneezing. Touching your mouth or nose and then touching a surface, which is then touched by another person, can also spread the virus. SYMPTOMS  Symptoms typically develop 1 to 3 days after you come in contact with a cold virus. Symptoms vary from person to person. They may include:  Runny nose.  Sneezing.  Nasal  congestion.  Sinus irritation.  Sore throat.  Loss of voice (laryngitis).  Cough.  Fatigue.  Muscle aches.  Loss of appetite.  Headache.  Low-grade fever. DIAGNOSIS  You might diagnose your own cold based on familiar symptoms, since most people get a cold 2 to 3 times a year. Your caregiver can confirm this based on your exam. Most importantly, your caregiver can check that your symptoms are not due to another disease such as strep throat, sinusitis, pneumonia, asthma, or epiglottitis. Blood tests, throat tests, and X-rays are not necessary to diagnose a common cold, but they may sometimes be helpful in excluding other more serious diseases. Your caregiver will decide if any further tests are required. RISKS AND COMPLICATIONS  You may be at risk for a more severe case of the common cold if you smoke cigarettes, have chronic heart disease (such as heart failure) or lung disease (such as asthma), or if you have a weakened immune system. The very young and very old are also at risk for more serious infections. Bacterial sinusitis, middle ear infections, and bacterial pneumonia can complicate the common cold. The common cold can worsen asthma and chronic obstructive pulmonary disease (COPD). Sometimes, these complications can require emergency medical care and may be life-threatening. PREVENTION  The best way to protect against getting a cold is to practice good hygiene. Avoid oral or hand contact with people with cold symptoms. Wash your hands often if contact occurs. There is no clear evidence that vitamin C, vitamin E, echinacea, or exercise reduces the chance  of developing a cold. However, it is always recommended to get plenty of rest and practice good nutrition. TREATMENT  Treatment is directed at relieving symptoms. There is no cure. Antibiotics are not effective, because the infection is caused by a virus, not by bacteria. Treatment may include:  Increased fluid intake. Sports drinks  offer valuable electrolytes, sugars, and fluids.  Breathing heated mist or steam (vaporizer or shower).  Eating chicken soup or other clear broths, and maintaining good nutrition.  Getting plenty of rest.  Using gargles or lozenges for comfort.  Controlling fevers with ibuprofen or acetaminophen as directed by your caregiver.  Increasing usage of your inhaler if you have asthma. Zinc gel and zinc lozenges, taken in the first 24 hours of the common cold, can shorten the duration and lessen the severity of symptoms. Pain medicines may help with fever, muscle aches, and throat pain. A variety of non-prescription medicines are available to treat congestion and runny nose. Your caregiver can make recommendations and may suggest nasal or lung inhalers for other symptoms.  HOME CARE INSTRUCTIONS   Only take over-the-counter or prescription medicines for pain, discomfort, or fever as directed by your caregiver.  Use a warm mist humidifier or inhale steam from a shower to increase air moisture. This may keep secretions moist and make it easier to breathe.  Drink enough water and fluids to keep your urine clear or pale yellow.  Rest as needed.  Return to work when your temperature has returned to normal or as your caregiver advises. You may need to stay home longer to avoid infecting others. You can also use a face mask and careful hand washing to prevent spread of the virus. SEEK MEDICAL CARE IF:   After the first few days, you feel you are getting worse rather than better.  You need your caregiver's advice about medicines to control symptoms.  You develop chills, worsening shortness of breath, or brown or red sputum. These may be signs of pneumonia.  You develop yellow or brown nasal discharge or pain in the face, especially when you bend forward. These may be signs of sinusitis.  You develop a fever, swollen neck glands, pain with swallowing, or white areas in the back of your throat.  These may be signs of strep throat. SEEK IMMEDIATE MEDICAL CARE IF:   You have a fever.  You develop severe or persistent headache, ear pain, sinus pain, or chest pain.  You develop wheezing, a prolonged cough, cough up blood, or have a change in your usual mucus (if you have chronic lung disease).  You develop sore muscles or a stiff neck. Document Released: 05/04/2001 Document Revised: 01/31/2012 Document Reviewed: 02/13/2014 [facility] Patient Information 2015 [place], [place]. This information is not intended to replace advice given to you by your health care provider. Make sure you discuss any questions you have with your health care provider.

## 2015-06-26 NOTE — Progress Notes (Signed)
Patient ID: [name], female   DOB: Sep 14, 1956, 59 y.o.   MRN: [id number]      Subjective:   Patient ID: [name] female    DOB: 06/25/1956 59 y.o.    MRN: [id number] Health Maintenance Due: Health Maintenance Due  Topic Date Due  . ZOSTAVAX  06/09/2014  . PAP SMEAR  11/25/2014  . INFLUENZA VACCINE  06/23/2015    _________________________________________________  HPI: [name] is a 59 y.o. female here for an acute visit. Pt has a PMH outlined below.  Please see problem-based charting assessment and plan note for further details of medical issues addressed at today's visit.  PMH: Past Medical History  Diagnosis Date  . Seasonal allergies   . Asthma     History of  . Hypertension   . Iron deficiency 12-07-2011  . Anemia   . GERD (gastroesophageal reflux disease)   . Heart murmur   . Osteoporosis   . PONV (postoperative nausea and vomiting)   . Dysrhythmia     occ palpitations   . Sleep apnea     no CPAP machine; sleep study 02/2010 REM AHI 61.7/hr, total sleep REM 14.8/hr  . Headache(784.0)     occasional   . Osteoarthritis   . Morbid obesity   . Acute urinary retention s/p Foley 01/07/2012  . Hemorrhoids, internal, with bleeding & prolapse 12/13/2011  . Myocardial infarction     unsure when; per cardiologist report  . Chronic headaches   . Coronary artery disease   . Anginal pain   . Dyslipidemia   . S/P CABG (coronary artery bypass graft)     x5 - LIMA to LAD, SVG to diagonal, SVG to OM1, SVG to OM2, SVG to PDA (Dr. Prescott Gum)  . Postmenopausal bleeding   . Neuropathy   . Blood dyscrasia     ABNORMAL VAGINAL BLEEDING  . Uterine cancer 7/15    clinical stage IA grade 1 endometrioid endometrial cancer    Medications: Current Outpatient Prescriptions on File Prior to Visit  Medication Sig Dispense Refill  . albuterol (PROVENTIL HFA;VENTOLIN HFA) 108 (90 BASE) MCG/ACT inhaler Inhale 2 puffs into the lungs every 6 (six) hours as needed for  wheezing or shortness of breath. 1 Inhaler 2  . aspirin 325 MG tablet Take 162.5 mg by mouth daily.    . CRESTOR 20 MG tablet TAKE 1 TABLET BY MOUTH EVERY DAY 30 tablet 0  . [facility] SENNA PLUS 8.6-50 MG per tablet Take 1 tablet by mouth at bedtime as needed (constipation).   5  . cycloSPORINE (RESTASIS) 0.05 % ophthalmic emulsion Place 1 drop into both eyes 2 (two) times daily. 0.4 mL 2  . diclofenac sodium (VOLTAREN) 1 % GEL Apply 2 g topically 2 (two) times daily as needed (joint pain).    Marland Kitchen esomeprazole (NEXIUM) 40 MG capsule TAKE ONE CAPSULE BY MOUTH EVERY DAY 30 capsule 3  . ferrous sulfate 325 (65 FE) MG tablet Take 1 tablet (325 mg total) by mouth 3 (three) times daily with meals. 90 tablet 3  . FLUOCINOLONE ACETONIDE OT Place 1 drop in ear(s) daily as needed.    . fluticasone (FLONASE) 50 MCG/ACT nasal spray Place 1 spray into both nostrils daily as needed for allergies or rhinitis.    . furosemide (LASIX) 20 MG tablet Take 20 mg by mouth every other day. PRN    . ipratropium (ATROVENT) 0.06 % nasal spray Place 2 sprays into both nostrils 4 (four) times  daily. 15 mL 1  . irbesartan (AVAPRO) 150 MG tablet Take 1 tablet (150 mg total) by mouth daily. 30 tablet 11  . metoprolol tartrate (LOPRESSOR) 25 MG tablet Take 25 mg by mouth 2 (two) times daily.    Marland Kitchen omeprazole (PRILOSEC) 40 MG capsule Take 40 mg by mouth daily.     No current facility-administered medications on file prior to visit.    Allergies: Allergies  Allergen Reactions  . Cephalosporins Anaphylaxis    Tongue swelling, gum pain  . Fish-Derived Products Shortness Of Breath  . Peanuts [Peanut Oil] Shortness Of Breath    Peanut butter-   . Codeine Other (See Comments)    hallucinations  . Latex Itching  . Ciprofloxacin   . Lisinopril Cough  . Naproxen Nausea Only    Head ahce  . Penicillins Other (See Comments)    Childhood allergy but pt thinks she is no longer allergic  . Prednisone Other (See Comments)    Heart  beating fast       FH: Family History  Problem Relation Age of Onset  . Colon cancer Neg Hx   . Hypertension Sister   . Heart failure Sister   . Heart disease Sister   . Diabetes type II Sister   . Kidney disease Brother     hypertension  . Heart attack Mother 67  . Heart disease Mother   . Hypertension Mother   . Diabetes Mother   . Hypertension Child     SH: History   Social History  . Marital Status: Single    Spouse Name: N/A  . Number of Children: 2  . Years of Education: N/A   Occupational History  . Retired    Social History Main Topics  . Smoking status: Never Smoker   . Smokeless tobacco: Never Used  . Alcohol Use: No  . Drug Use: No  . Sexual Activity: Not Currently   Other Topics Concern  . None   Social History Narrative   Lives alone in a one story home.  Has 2 children.     Retired Regulatory affairs officer.     Education: 2 years of college.    Review of Systems: Constitutional: Negative for fever, chills and weight loss.  Eyes: Negative for blurred vision.  Respiratory: +cough and neg SOB.    Cardiovascular: Negative for chest pain, palpitations and leg swelling.  Gastrointestinal: +nausea, vomiting, abdominal pain, diarrhea, constipation and blood in stool.  Genitourinary: +dysuria and frequency.  Musculoskeletal: +myalgias and back pain.  Neurological: Negative for dizziness, +weakness and +headaches.     Objective:   Vital Signs: Filed Vitals:   06/26/15 1332  BP: 160/74  Pulse: 76  Temp: 98.3 F (36.8 C)  TempSrc: Oral  Height: 5\' 8"  (1.727 m)  Weight: 338 lb 1.6 oz (153.361 kg)  SpO2: 99%     BP Readings from Last 3 Encounters:  06/26/15 160/74  06/19/15 157/90  05/30/15 140/78    Physical Exam: Constitutional: Vital signs reviewed.  Patient is in NAD and cooperative with exam.  Head: Normocephalic and atraumatic. Eyes: EOMI, conjunctivae nl, no scleral icterus.  Ears: Cerumen in canal, TMs nl.  Throat: No erythema,  exudate.   Neck: Supple, right lymphadenopathy TTP.   Cardiovascular: RRR, no MRG. Pulmonary/Chest: normal effort, CTAB, no wheezes, rales, or rhonchi. Abdominal: Obese.  Soft. NT/ND +BS. Neurological: A&O x3, cranial nerves II-XII are grossly intact, moving all extremities. Extremities: 2+DP b/l; no pitting edema. Skin: Warm, dry and intact.  No rash.   Assessment & Plan:   Assessment and plan was discussed and formulated with my attending.

## 2015-06-26 NOTE — Addendum Note (Signed)
Addended by: Jones Bales on: 06/26/2015 04:28 PM   Modules accepted: Orders, Level of Service

## 2015-06-26 NOTE — Assessment & Plan Note (Signed)
BP elevated 160/74. -cont current meds, may need some adjustments made as not at goal

## 2015-06-26 NOTE — Assessment & Plan Note (Signed)
Pt c/o dysuria and frequency. -UA

## 2015-06-27 ENCOUNTER — Encounter (HOSPITAL_COMMUNITY): Payer: Self-pay

## 2015-06-27 LAB — URINALYSIS, ROUTINE W REFLEX MICROSCOPIC
Bilirubin, UA: NEGATIVE
GLUCOSE, UA: NEGATIVE
Ketones, UA: NEGATIVE
Leukocytes, UA: NEGATIVE
NITRITE UA: NEGATIVE
Protein, UA: NEGATIVE
Specific Gravity, UA: 1.009 (ref 1.005–1.030)
UUROB: 0.2 mg/dL (ref 0.2–1.0)
pH, UA: 6.5 (ref 5.0–7.5)

## 2015-06-27 LAB — MICROSCOPIC EXAMINATION: Casts: NONE SEEN /lpf

## 2015-06-27 MED ORDER — OSELTAMIVIR PHOSPHATE 75 MG PO CAPS: 75.0000 mg | ORAL_CAPSULE | Freq: Two times a day (BID) | ORAL | Status: AC

## 2015-06-27 NOTE — Addendum Note (Signed)
Addended by: Jones Bales on: 06/27/2015 09:43 AM   Modules accepted: Orders

## 2015-06-30 ENCOUNTER — Encounter (HOSPITAL_COMMUNITY): Payer: Self-pay

## 2015-06-30 NOTE — Progress Notes (Signed)
Internal Medicine Clinic Attending  Case discussed with Dr. Gill soon after the resident saw the patient.  We reviewed the resident's history and exam and pertinent patient test results.  I agree with the assessment, diagnosis, and plan of care documented in the resident's note.  

## 2015-07-02 ENCOUNTER — Encounter (HOSPITAL_COMMUNITY): Payer: Self-pay

## 2015-07-04 ENCOUNTER — Encounter (HOSPITAL_COMMUNITY): Payer: Self-pay

## 2015-07-07 ENCOUNTER — Encounter (HOSPITAL_COMMUNITY): Payer: Self-pay

## 2015-07-09 ENCOUNTER — Other Ambulatory Visit: Payer: Self-pay | Admitting: Internal Medicine

## 2015-07-09 ENCOUNTER — Encounter (HOSPITAL_COMMUNITY): Payer: Self-pay

## 2015-07-09 NOTE — Telephone Encounter (Signed)
Rx request sent to pharmacy.  

## 2015-07-10 ENCOUNTER — Ambulatory Visit (INDEPENDENT_AMBULATORY_CARE_PROVIDER_SITE_OTHER): Payer: Medicare Other | Admitting: Internal Medicine

## 2015-07-10 ENCOUNTER — Encounter: Payer: Self-pay | Admitting: Internal Medicine

## 2015-07-10 VITALS — BP 132/55 | HR 59 | Temp 97.8°F | Ht 68.0 in | Wt 338.8 lb

## 2015-07-10 DIAGNOSIS — I10 Essential (primary) hypertension: Secondary | ICD-10-CM

## 2015-07-10 DIAGNOSIS — J019 Acute sinusitis, unspecified: Secondary | ICD-10-CM | POA: Diagnosis not present

## 2015-07-10 NOTE — Progress Notes (Signed)
   Subjective:    Patient ID: [name], female    DOB: June 18, 1956, 59 y.o.   MRN: [id number]  HPI Ms Reller is a 59 year old very pleasant lady with morbid obesity, hypertension, s/p CABG X 5 in 12/2013 undergoing cardiac rehab. The following issues were discussed this visit:  Chest pain - She has not been having any chest pain currently. She has just seen (06/19/2015) Dr Debara Pickett for symptoms suggestive of unstable angina and has been encouraged to lose weight.   Varicose vein in the scar s/p CABG - Patient to see vascular specialists as per Dr Lysbeth Penner note. She is yet to make appointment.   Sinusitis - The patient reports symptoms of sinusitis for the past 2 weeks including cough, post nasal drip, runny nose, maxillary and frontal facial soreness and headaches. She says she has been doing home remedies like steaming for it and finally has started to feel better, although she does have post nasal drip and congestion all the time.   Hypertension - Initial blood pressure at the clinic visit was high 167/77, repeat measurement was 132/55. The patient reports that her blood pressure at home is closer to 975O systolic.    Preventive - The patient is interested in flu shot as soon as the season begins.     Review of Systems  Constitutional: Negative for fever and fatigue.  HENT: Positive for congestion. Negative for ear discharge, ear pain, facial swelling, hearing loss, mouth sores, rhinorrhea, sinus pressure, sneezing, sore throat, trouble swallowing and voice change.   Respiratory: Negative for cough, choking, chest tightness and shortness of breath.   Cardiovascular: Negative for chest pain, palpitations and leg swelling.  Skin:       Varicose vein on chest       Objective:   Physical Exam  Constitutional: She is oriented to person, place, and time. No distress.  Morbid obesity  HENT:  Head: Normocephalic and atraumatic.  Nose: Nose normal.  Mouth/Throat: Oropharynx is clear and  moist. No oropharyngeal exudate.  Eyes: Conjunctivae are normal. Pupils are equal, round, and reactive to light. Right eye exhibits no discharge. Left eye exhibits no discharge.  Neck: Normal range of motion. Neck supple.  Cardiovascular: Normal rate, regular rhythm, normal heart sounds and intact distal pulses.   No murmur heard. Pulmonary/Chest: Effort normal and breath sounds normal. She has no rales.  Musculoskeletal: She exhibits no edema.  Lymphadenopathy:    She has no cervical adenopathy.  Neurological: She is alert and oriented to person, place, and time.  Skin: She is not diaphoretic.  Varicose vein on the CABG scar in the middle of chest   Assessment & Plan:   Please see problem based charting.

## 2015-07-10 NOTE — Patient Instructions (Signed)
[name] Please keep taking your medications regularly.  If your sinusitis is not getting better, please call the clinic and we will prescribe you an antibiotic.   Madilyn Fireman MD MPH 07/10/2015 10:52 AM [facility] [address] [place], [place] [address]. Ph: [phone] Hours: 8 am - 5 pm  Sinusitis Sinusitis is redness, soreness, and puffiness (inflammation) of the air pockets in the bones of your face (sinuses). The redness, soreness, and puffiness can cause air and mucus to get trapped in your sinuses. This can allow germs to grow and cause an infection.  HOME CARE   Drink enough fluids to keep your pee (urine) clear or pale yellow.  Use a humidifier in your home.  Run a hot shower to create steam in the bathroom. Sit in the bathroom with the door closed. Breathe in the steam 3-4 times a day.  Put a warm, moist washcloth on your face 3-4 times a day, or as told by your doctor.  Use salt water sprays (saline sprays) to wet the thick fluid in your nose. This can help the sinuses drain.  Only take medicine as told by your doctor. GET HELP RIGHT AWAY IF:   Your pain gets worse.  You have very bad headaches.  You are sick to your stomach (nauseous).  You throw up (vomit).  You are very sleepy (drowsy) all the time.  Your face is puffy (swollen).  Your vision changes.  You have a stiff neck.  You have trouble breathing. MAKE SURE YOU:   Understand these instructions.  Will watch your condition.  Will get help right away if you are not doing well or get worse. Document Released: 04/26/2008 Document Revised: 08/02/2012 Document Reviewed: 06/13/2012 [facility] Patient Information 2015 [place], [place]. This information is not intended to replace advice given to you by your health care provider. Make sure you discuss any questions you have with your health care provider.

## 2015-07-11 ENCOUNTER — Encounter (HOSPITAL_COMMUNITY): Payer: Self-pay

## 2015-07-12 NOTE — Assessment & Plan Note (Signed)
The patient seems to have suffered from a bout of acute to subacute sinusitis which seems to have run its natural course and is improving with home therapy. She is still troubled by congestion and post nasal drip. Reassurance provided about improving course. If the patient is not relieved and continues to have symptoms or this develops into a more chronic sinusitis, I have reassured patient that she can call us back and we will prescribe her antibiotics over the phone. Of note here, the patient has allergies listed as penicillin, however she reports that she is no longer allergic to it and has received it as an adult and been okay.

## 2015-07-12 NOTE — Assessment & Plan Note (Signed)
The patient herself brought the topic up and this was discussed for 50% of the visit time. She reports being conscious about her diet and participated in cardiac rehab with enthusiasm, however has instead gained weight.  Vitals - 1 value per visit 07/10/2015 06/26/2015 06/19/2015 05/30/2015  Weight (lb) 338.8 338.1 334.3 328.3   She mentioned her concern about her breasts becoming too heavy resulting in pain in her back and wonders about surgical reduction. She is currently recovering from sinusitis and it feels like she is "catching her breath after a period of weakness" and will focus more on weight loss after getting better.

## 2015-07-12 NOTE — Assessment & Plan Note (Signed)
Patient's initial BP was higher likely due to sympathetic drive of exertion from walking into the clinic. Her subsequent Bp was within normal range.  - The patient is compliant with her medications and she will continue with her current regimen at this time.

## 2015-07-14 ENCOUNTER — Encounter (HOSPITAL_COMMUNITY): Payer: Self-pay

## 2015-07-16 ENCOUNTER — Encounter (HOSPITAL_COMMUNITY)
Admission: RE | Admit: 2015-07-16 | Discharge: 2015-07-16 | Disposition: A | Discharge status: Home / Self Care | Payer: Self-pay | Source: Ambulatory Visit | Attending: Internal Medicine | Admitting: Internal Medicine

## 2015-07-18 ENCOUNTER — Encounter (HOSPITAL_COMMUNITY): Payer: Self-pay

## 2015-07-19 ENCOUNTER — Emergency Department (INDEPENDENT_AMBULATORY_CARE_PROVIDER_SITE_OTHER)
Admission: EM | Admit: 2015-07-19 | Discharge: 2015-07-19 | Disposition: A | Discharge status: Home / Self Care | Payer: Medicare Other | Source: Home / Self Care

## 2015-07-19 ENCOUNTER — Other Ambulatory Visit (HOSPITAL_COMMUNITY)
Admission: RE | Admit: 2015-07-19 | Discharge: 2015-07-19 | Disposition: A | Discharge status: Home / Self Care | Payer: Medicare Other | Source: Ambulatory Visit | Attending: Family Medicine | Admitting: Family Medicine

## 2015-07-19 DIAGNOSIS — N760 Acute vaginitis: Secondary | ICD-10-CM | POA: Insufficient documentation

## 2015-07-19 DIAGNOSIS — R1030 Lower abdominal pain, unspecified: Secondary | ICD-10-CM | POA: Diagnosis not present

## 2015-07-19 DIAGNOSIS — N8110 Cystocele, unspecified: Secondary | ICD-10-CM | POA: Diagnosis not present

## 2015-07-19 DIAGNOSIS — R319 Hematuria, unspecified: Secondary | ICD-10-CM | POA: Diagnosis not present

## 2015-07-19 DIAGNOSIS — N898 Other specified noninflammatory disorders of vagina: Secondary | ICD-10-CM

## 2015-07-19 LAB — POCT URINALYSIS DIP (DEVICE)
BILIRUBIN URINE: NEGATIVE
Glucose, UA: NEGATIVE mg/dL
KETONES UR: NEGATIVE mg/dL
Leukocytes, UA: NEGATIVE
Nitrite: NEGATIVE
PH: 5 (ref 5.0–8.0)
PROTEIN: NEGATIVE mg/dL
SPECIFIC GRAVITY, URINE: 1.01 (ref 1.005–1.030)
Urobilinogen, UA: 0.2 mg/dL (ref 0.0–1.0)

## 2015-07-19 MED ORDER — FLUCONAZOLE 150 MG PO TABS: 150.0000 mg | ORAL_TABLET | Freq: Once | ORAL | Status: AC

## 2015-07-19 NOTE — Discharge Instructions (Signed)
It was nice seeing you today. You do have some discharge. Wet prep was done, I will call you with result. You also have what is call vaginal prolapse. Please see your gynecologist soon.

## 2015-07-19 NOTE — ED Provider Notes (Signed)
CSN: [id number]     Arrival date & time 07/19/15  1543 History   None    Chief Complaint  Patient presents with  . Groin Pain   (Consider location/radiation/quality/duration/timing/severity/associated sxs/prior Treatment) Patient is a 59 y.o. female presenting with vaginal itching, groin pain, and hematuria. The history is provided by the patient. No language interpreter was used.  Vaginal Itching This is a new (Vaginal burning and itching ) problem. Episode onset: She started having burning in her vulva area 4 wks but it became itchy 1 wk ago. The problem occurs constantly. The problem has not changed since onset.Pertinent negatives include no headaches. Nothing aggravates the symptoms. Nothing relieves the symptoms.  Groin Pain This is a new problem. The current episode started more than 1 week ago. The problem occurs constantly. The problem has not changed since onset.Pertinent negatives include no headaches. Associated symptoms comments: Feels like pressure like pain in her pelvic area and deep inside her vagina. Nothing aggravates the symptoms. She has tried nothing for the symptoms.  Hematuria This is a chronic problem. Pertinent negatives include no headaches.  Patient stated she had been told in the past she has blood in her urine. No other concern.  Past Medical History  Diagnosis Date  . Seasonal allergies   . Asthma     History of  . Hypertension   . Iron deficiency 12-07-2011  . Anemia   . GERD (gastroesophageal reflux disease)   . Heart murmur   . Osteoporosis   . PONV (postoperative nausea and vomiting)   . Dysrhythmia     occ palpitations   . Sleep apnea     no CPAP machine; sleep study 02/2010 REM AHI 61.7/hr, total sleep REM 14.8/hr  . Headache(784.0)     occasional   . Osteoarthritis   . Morbid obesity   . Acute urinary retention s/p Foley 01/07/2012  . Hemorrhoids, internal, with bleeding & prolapse 12/13/2011  . Myocardial infarction     unsure when; per  cardiologist report  . Chronic headaches   . Coronary artery disease   . Anginal pain   . Dyslipidemia   . S/P CABG (coronary artery bypass graft)     x5 - LIMA to LAD, SVG to diagonal, SVG to OM1, SVG to OM2, SVG to PDA (Dr. Prescott Gum)  . Postmenopausal bleeding   . Neuropathy   . Blood dyscrasia     ABNORMAL VAGINAL BLEEDING  . Uterine cancer 7/15    clinical stage IA grade 1 endometrioid endometrial cancer   Past Surgical History  Procedure Laterality Date  . Knee surgery Bilateral 1999  . Total knee arthroplasty Left 04/29/2011  . Multiple tooth extractions    . Wisdom tooth extraction    . Colonoscopy  2009  . Knee arthroscopy  1991  . Transanal hemorrhoidal dearterliaization  01/06/12    with external hemorrhoid removal  . Coronary artery bypass graft N/A 01/09/2014    Procedure: CORONARY ARTERY BYPASS GRAFTING (CABG) x 5 using left internal mammary artery and right leg greater saphenous vein harvested endoscopically;  Surgeon: Ivin Poot, MD;  Location: [place];  Service: Open Heart Surgery;  Laterality: N/A;  please use bed extenders and breast binder  . Intraoperative transesophageal echocardiogram N/A 01/09/2014    Procedure: INTRAOPERATIVE TRANSESOPHAGEAL ECHOCARDIOGRAM;  Surgeon: Ivin Poot, MD;  Location: [place];  Service: Open Heart Surgery;  Laterality: N/A;  . Tee without cardioversion  01/2010    EF 60-65%, small, flat, non-infiltrating,  calcified, fixed apical/septal mass  . Nm myocar perf wall motion  01/2010    dipyridamole myoview - moderate perfusion defect in basal inferoseptal, basal inferior, mid inferoseptal, mid inferior, apical inferior region; EF 56%  . Cardiac catheterization    . Multiple extractions with alveoloplasty N/A 04/11/2014    Procedure: Extraction of tooth #'s 1,2,8,16 with alveoloplasty, maxillary tuberosity reductions, and gross debridement of remaining teeth.;  Surgeon: Lenn Cal, DDS;  Location: [place];  Service: Oral Surgery;   Laterality: N/A;  . Vaginal hysterectomy N/A 06/03/2014    Procedure: HYSTERECTOMY VAGINAL;  Surgeon: Lavonia Drafts, MD;  Location: [facility];  Service: Gynecology;  Laterality: N/A;  . Unilateral salpingectomy Right 06/03/2014    Procedure: UNILATERAL SALPINGECTOMY;  Surgeon: Lavonia Drafts, MD;  Location: [facility];  Service: Gynecology;  Laterality: Right;  . Left heart catheterization with coronary angiogram N/A 01/04/2014    Procedure: LEFT HEART CATHETERIZATION WITH CORONARY ANGIOGRAM;  Surgeon: Sinclair Grooms, MD;  Location: [facility] C[facility];  Service: Cardiovascular;  Laterality: N/A;   Family History  Problem Relation Age of Onset  . Colon cancer Neg Hx   . Hypertension Sister   . Heart failure Sister   . Heart disease Sister   . Diabetes type II Sister   . Kidney disease Brother     hypertension  . Heart attack Mother 75  . Heart disease Mother   . Hypertension Mother   . Diabetes Mother   . Hypertension Child    Social History  Substance Use Topics  . Smoking status: Never Smoker   . Smokeless tobacco: Never Used  . Alcohol Use: No   OB History    Gravida Para Term Preterm AB TAB SAB Ectopic Multiple Living   2 2 2  0 0 0 0 0 0 2     Review of Systems  Respiratory: Negative.   Cardiovascular: Negative.   Genitourinary: Positive for frequency, hematuria and vaginal pain. Negative for vaginal bleeding, enuresis and menstrual problem.  Musculoskeletal: Negative.   Neurological: Negative for headaches.  All other systems reviewed and are negative.   Allergies  Cephalosporins; Fish-derived products; Peanuts; Codeine; Latex; Ciprofloxacin; Lisinopril; Naproxen; Penicillins; and Prednisone  Home Medications   Prior to Admission medications   Medication Sig Start Date End Date Taking? Authorizing Provider  albuterol (PROVENTIL HFA;VENTOLIN HFA) 108 (90 BASE) MCG/ACT inhaler Inhale 2 puffs into the lungs every 6 (six) hours as needed for wheezing or shortness  of breath. 03/30/15   Gregor Hams, MD  aspirin 325 MG tablet Take 162.5 mg by mouth daily.    Historical Provider, MD  CRESTOR 20 MG tablet TAKE 1 TABLET BY MOUTH EVERY DAY 07/09/15   [name], MD  [employer] SENNA PLUS 8.6-50 MG per tablet Take 1 tablet by mouth at bedtime as needed (constipation).  07/21/14   Historical Provider, MD  cycloSPORINE (RESTASIS) 0.05 % ophthalmic emulsion Place 1 drop into both eyes 2 (two) times daily. 10/08/14   Madilyn Fireman, MD  diclofenac sodium (VOLTAREN) 1 % GEL Apply 2 g topically 2 (two) times daily as needed (joint pain).    Historical Provider, MD  esomeprazole (NEXIUM) 40 MG capsule TAKE ONE CAPSULE BY MOUTH EVERY DAY 06/10/15   Madilyn Fireman, MD  ferrous sulfate 325 (65 FE) MG tablet Take 1 tablet (325 mg total) by mouth 3 (three) times daily with meals. 01/22/15 01/22/16  Rushil Sherrye Payor, MD  FLUOCINOLONE ACETONIDE OT Place 1 drop in ear(s)  daily as needed.    Historical Provider, MD  fluticasone (FLONASE) 50 MCG/ACT nasal spray Place 1 spray into both nostrils daily as needed for allergies or rhinitis.    Historical Provider, MD  furosemide (LASIX) 20 MG tablet Take 20 mg by mouth every other day. PRN    Historical Provider, MD  guaiFENesin-dextromethorphan (ROBITUSSIN DM) 100-10 MG/5ML syrup Take 5 mLs by mouth every 6 (six) hours as needed for cough. 06/26/15   Jones Bales, MD  ipratropium (ATROVENT) 0.06 % nasal spray Place 2 sprays into both nostrils 4 (four) times daily. 03/30/15   Gregor Hams, MD  irbesartan (AVAPRO) 150 MG tablet Take 1 tablet (150 mg total) by mouth daily. 06/19/15   [name], MD  metoprolol tartrate (LOPRESSOR) 25 MG tablet Take 25 mg by mouth 2 (two) times daily.    Historical Provider, MD  omeprazole (PRILOSEC) 40 MG capsule Take 40 mg by mouth daily.    Historical Provider, MD   Meds Ordered and Administered this Visit  Medications - No data to display  BP 151/87 mmHg  Pulse 90  Temp(Src) 99.1 F (37.3 C) (Oral)   Resp 20  SpO2 96%  LMP 01/13/2014 No data found.   Physical Exam  Constitutional: She appears well-developed. No distress.  Cardiovascular: Normal rate, regular rhythm and normal heart sounds.   No murmur heard. Pulmonary/Chest: Effort normal and breath sounds normal. No respiratory distress. She has no wheezes.  Abdominal: Soft. Bowel sounds are normal. She exhibits no mass. There is no tenderness.  Genitourinary:    Right adnexum displays no mass. Left adnexum displays no mass. No tenderness or bleeding in the vagina. Vaginal discharge found.    Nursing note and vitals reviewed.   ED Course  Procedures (including critical care time)  Labs Review Labs Reviewed  POCT URINALYSIS DIP (DEVICE) - Abnormal; Notable for the following:    Hgb urine dipstick MODERATE (*)    All other components within normal limits    Imaging Review No results found.   Visual Acuity Review  Right Eye Distance:   Left Eye Distance:   Bilateral Distance:    Right Eye Near:   Left Eye Near:    Bilateral Near:      Urinalysis    Component Value Date/Time   COLORURINE YELLOW 09/28/2014 1445   APPEARANCEUR CLOUDY* 09/28/2014 1445   LABSPEC 1.010 07/19/2015 1605   PHURINE 5.0 07/19/2015 1605   GLUCOSEU NEGATIVE 07/19/2015 1605   HGBUR MODERATE* 07/19/2015 1605   HGBUR moderate 12/15/2009 0000   BILIRUBINUR NEGATIVE 07/19/2015 1605   BILIRUBINUR Negative 06/26/2015 1400   KETONESUR NEGATIVE 07/19/2015 1605   PROTEINUR NEGATIVE 07/19/2015 1605   UROBILINOGEN 0.2 07/19/2015 1605   NITRITE NEGATIVE 07/19/2015 1605   NITRITE Negative 06/26/2015 1400   LEUKOCYTESUR NEGATIVE 07/19/2015 1605   LEUKOCYTESUR Negative 06/26/2015 1400        MDM  No diagnosis found. Groin discomfort, unspecified laterality  Prolapse of vaginal wall  Vaginal discharge  Hematuria  Patient has thin whitish discharge. Wet prep done, I will call her with result. I gave her 1 tab of diflucan pending  result. Her groin pressure like discomfort is likely due to vaginal vault prolapse. As discussed with her she need to get assessed by her gynecologist. She will make appointment soon. Follow up precaution discussed. Her urine was + to blood, per patient this is not new for her. Nitrite and leukocyte are negative. Patient advised to follow up  with PCP for full hematuria workup. She agreed with paln.  Kinnie Feil, MD 07/19/15 [phone]

## 2015-07-21 ENCOUNTER — Encounter (HOSPITAL_COMMUNITY): Payer: Self-pay

## 2015-07-22 ENCOUNTER — Telehealth: Payer: Self-pay | Admitting: Family Medicine

## 2015-07-22 LAB — CERVICOVAGINAL ANCILLARY ONLY: WET PREP (BD AFFIRM): POSITIVE — AB

## 2015-07-22 MED ORDER — METRONIDAZOLE 500 MG PO TABS: 500.0000 mg | ORAL_TABLET | Freq: Two times a day (BID) | ORAL | Status: AC

## 2015-07-22 NOTE — ED Notes (Signed)
Wet prep positive for gardnerella , provider has already discussed report. [place] waiting for STD panel reports

## 2015-07-22 NOTE — Telephone Encounter (Signed)
+   BV. Patient informed. I advised her to pick up her A/B from her pharmacy today. All questions were answered

## 2015-07-23 ENCOUNTER — Encounter (HOSPITAL_COMMUNITY)
Admission: RE | Admit: 2015-07-23 | Discharge: 2015-07-23 | Disposition: A | Discharge status: Home / Self Care | Payer: Self-pay | Source: Ambulatory Visit | Attending: Internal Medicine | Admitting: Internal Medicine

## 2015-07-25 ENCOUNTER — Encounter (HOSPITAL_COMMUNITY): Payer: Medicare Other | Attending: Internal Medicine

## 2015-07-25 ENCOUNTER — Telehealth: Payer: Self-pay | Admitting: *Deleted

## 2015-07-25 DIAGNOSIS — Z951 Presence of aortocoronary bypass graft: Secondary | ICD-10-CM | POA: Diagnosis not present

## 2015-07-25 NOTE — Telephone Encounter (Signed)
RETURNED CALL TO PATIENT, REQUESTING REFERRAL FOR BREAST REDUCTION. MESSAGE SENT TO [facility].

## 2015-07-27 ENCOUNTER — Encounter (HOSPITAL_COMMUNITY): Payer: Self-pay | Admitting: Emergency Medicine

## 2015-07-27 ENCOUNTER — Emergency Department (INDEPENDENT_AMBULATORY_CARE_PROVIDER_SITE_OTHER)
Admission: EM | Admit: 2015-07-27 | Discharge: 2015-07-27 | Disposition: A | Discharge status: Home / Self Care | Payer: Medicare Other | Source: Home / Self Care | Attending: Family Medicine | Admitting: Family Medicine

## 2015-07-27 DIAGNOSIS — A499 Bacterial infection, unspecified: Secondary | ICD-10-CM | POA: Diagnosis not present

## 2015-07-27 DIAGNOSIS — T887XXA Unspecified adverse effect of drug or medicament, initial encounter: Secondary | ICD-10-CM | POA: Diagnosis not present

## 2015-07-27 DIAGNOSIS — N760 Acute vaginitis: Secondary | ICD-10-CM | POA: Diagnosis not present

## 2015-07-27 DIAGNOSIS — B9689 Other specified bacterial agents as the cause of diseases classified elsewhere: Secondary | ICD-10-CM

## 2015-07-27 DIAGNOSIS — T50905A Adverse effect of unspecified drugs, medicaments and biological substances, initial encounter: Secondary | ICD-10-CM

## 2015-07-27 NOTE — ED Provider Notes (Signed)
CSN: [id number]     Arrival date & time 07/27/15  1410 History   First MD Initiated Contact with Patient 07/27/15 1549     Chief Complaint  Patient presents with  . Medication Reaction   (Consider location/radiation/quality/duration/timing/severity/associated sxs/prior Treatment) HPI Comments: Patient presents with "side effects" from Flagyl and needs to know if she needs anything additional. Patient was diagnosed with BV on 08/27. At that time she was given flagyl which she has never had. Once she started taking it she began to notice a metal taste in her mouth, higher than normal BP's, mild dyspnea, dry mouth and some palpitations. She did not take the medication today and is feeling much better. She is no longer have vaginal symptoms, but wanted to know if she should take any additional medications.   The history is provided by the patient.    Past Medical History  Diagnosis Date  . Seasonal allergies   . Asthma     History of  . Hypertension   . Iron deficiency 12-07-2011  . Anemia   . GERD (gastroesophageal reflux disease)   . Heart murmur   . Osteoporosis   . PONV (postoperative nausea and vomiting)   . Dysrhythmia     occ palpitations   . Sleep apnea     no CPAP machine; sleep study 02/2010 REM AHI 61.7/hr, total sleep REM 14.8/hr  . Headache(784.0)     occasional   . Osteoarthritis   . Morbid obesity   . Acute urinary retention s/p Foley 01/07/2012  . Hemorrhoids, internal, with bleeding & prolapse 12/13/2011  . Myocardial infarction     unsure when; per cardiologist report  . Chronic headaches   . Coronary artery disease   . Anginal pain   . Dyslipidemia   . S/P CABG (coronary artery bypass graft)     x5 - LIMA to LAD, SVG to diagonal, SVG to OM1, SVG to OM2, SVG to PDA (Dr. Prescott Gum)  . Postmenopausal bleeding   . Neuropathy   . Blood dyscrasia     ABNORMAL VAGINAL BLEEDING  . Uterine cancer 7/15    clinical stage IA grade 1 endometrioid endometrial cancer    Past Surgical History  Procedure Laterality Date  . Knee surgery Bilateral 1999  . Total knee arthroplasty Left 04/29/2011  . Multiple tooth extractions    . Wisdom tooth extraction    . Colonoscopy  2009  . Knee arthroscopy  1991  . Transanal hemorrhoidal dearterliaization  01/06/12    with external hemorrhoid removal  . Coronary artery bypass graft N/A 01/09/2014    Procedure: CORONARY ARTERY BYPASS GRAFTING (CABG) x 5 using left internal mammary artery and right leg greater saphenous vein harvested endoscopically;  Surgeon: Ivin Poot, MD;  Location: [facility];  Service: Open Heart Surgery;  Laterality: N/A;  please use bed extenders and breast binder  . Intraoperative transesophageal echocardiogram N/A 01/09/2014    Procedure: INTRAOPERATIVE TRANSESOPHAGEAL ECHOCARDIOGRAM;  Surgeon: Ivin Poot, MD;  Location: [place];  Service: Open Heart Surgery;  Laterality: N/A;  . Tee without cardioversion  01/2010    EF 60-65%, small, flat, non-infiltrating, calcified, fixed apical/septal mass  . Nm myocar perf wall motion  01/2010    dipyridamole myoview - moderate perfusion defect in basal inferoseptal, basal inferior, mid inferoseptal, mid inferior, apical inferior region; EF 56%  . Cardiac catheterization    . Multiple extractions with alveoloplasty N/A 04/11/2014    Procedure: Extraction of tooth #'s 1,2,8,16 with  alveoloplasty, maxillary tuberosity reductions, and gross debridement of remaining teeth.;  Surgeon: Lenn Cal, DDS;  Location: [place];  Service: Oral Surgery;  Laterality: N/A;  . Vaginal hysterectomy N/A 06/03/2014    Procedure: HYSTERECTOMY VAGINAL;  Surgeon: Lavonia Drafts, MD;  Location: [facility];  Service: Gynecology;  Laterality: N/A;  . Unilateral salpingectomy Right 06/03/2014    Procedure: UNILATERAL SALPINGECTOMY;  Surgeon: Lavonia Drafts, MD;  Location: [facility];  Service: Gynecology;  Laterality: Right;  . Left heart catheterization with coronary  angiogram N/A 01/04/2014    Procedure: LEFT HEART CATHETERIZATION WITH CORONARY ANGIOGRAM;  Surgeon: Sinclair Grooms, MD;  Location: [facility] CATH LAB;  Service: Cardiovascular;  Laterality: N/A;   Family History  Problem Relation Age of Onset  . Colon cancer Neg Hx   . Hypertension Sister   . Heart failure Sister   . Heart disease Sister   . Diabetes type II Sister   . Kidney disease Brother     hypertension  . Heart attack Mother 53  . Heart disease Mother   . Hypertension Mother   . Diabetes Mother   . Hypertension Child    Social History  Substance Use Topics  . Smoking status: Never Smoker   . Smokeless tobacco: Never Used  . Alcohol Use: No   OB History    Gravida Para Term Preterm AB TAB SAB Ectopic Multiple Living   2 2 2  0 0 0 0 0 0 2     Review of Systems  Constitutional: Negative for fatigue.  Respiratory: Negative for shortness of breath.        SOB has improved today  Cardiovascular: Negative for chest pain, palpitations and leg swelling.  Endocrine: Negative.   Skin: Negative.   Allergic/Immunologic: Negative.   Psychiatric/Behavioral: Negative.     Allergies  Cephalosporins; Fish-derived products; Peanuts; Codeine; Latex; Ciprofloxacin; Lisinopril; Naproxen; Penicillins; and Prednisone  Home Medications   Prior to Admission medications   Medication Sig Start Date End Date Taking? Authorizing Provider  metoprolol tartrate (LOPRESSOR) 25 MG tablet Take 25 mg by mouth 2 (two) times daily.   Yes Historical Provider, MD  metroNIDAZOLE (FLAGYL) 500 MG tablet Take 1 tablet (500 mg total) by mouth 2 (two) times daily. 07/22/15 07/29/15 Yes Kinnie Feil, MD  omeprazole (PRILOSEC) 40 MG capsule Take 40 mg by mouth daily.   Yes Historical Provider, MD  albuterol (PROVENTIL HFA;VENTOLIN HFA) 108 (90 BASE) MCG/ACT inhaler Inhale 2 puffs into the lungs every 6 (six) hours as needed for wheezing or shortness of breath. 03/30/15   Gregor Hams, MD  aspirin 325 MG tablet  Take 162.5 mg by mouth daily.    Historical Provider, MD  CRESTOR 20 MG tablet TAKE 1 TABLET BY MOUTH EVERY DAY 07/09/15   [name], MD  [employer] SENNA PLUS 8.6-50 MG per tablet Take 1 tablet by mouth at bedtime as needed (constipation).  07/21/14   Historical Provider, MD  cycloSPORINE (RESTASIS) 0.05 % ophthalmic emulsion Place 1 drop into both eyes 2 (two) times daily. 10/08/14   Madilyn Fireman, MD  diclofenac sodium (VOLTAREN) 1 % GEL Apply 2 g topically 2 (two) times daily as needed (joint pain).    Historical Provider, MD  esomeprazole (NEXIUM) 40 MG capsule TAKE ONE CAPSULE BY MOUTH EVERY DAY 06/10/15   Madilyn Fireman, MD  ferrous sulfate 325 (65 FE) MG tablet Take 1 tablet (325 mg total) by mouth 3 (three) times daily with meals. 01/22/15 01/22/16  Riccardo Dubin, MD  FLUOCINOLONE ACETONIDE OT Place 1 drop in ear(s) daily as needed.    Historical Provider, MD  fluticasone (FLONASE) 50 MCG/ACT nasal spray Place 1 spray into both nostrils daily as needed for allergies or rhinitis.    Historical Provider, MD  furosemide (LASIX) 20 MG tablet Take 20 mg by mouth every other day. PRN    Historical Provider, MD  guaiFENesin-dextromethorphan (ROBITUSSIN DM) 100-10 MG/5ML syrup Take 5 mLs by mouth every 6 (six) hours as needed for cough. 06/26/15   Jones Bales, MD  ipratropium (ATROVENT) 0.06 % nasal spray Place 2 sprays into both nostrils 4 (four) times daily. 03/30/15   Gregor Hams, MD  irbesartan (AVAPRO) 150 MG tablet Take 1 tablet (150 mg total) by mouth daily. 06/19/15   [name], MD   Meds Ordered and Administered this Visit  Medications - No data to display  BP 156/86 mmHg  Pulse 98  Temp(Src) 98.1 F (36.7 C) (Oral)  Resp 18  SpO2 99%  LMP 01/13/2014 No data found.   Physical Exam  Constitutional: She is oriented to person, place, and time. She appears well-developed and well-nourished. No distress.  Non-toxic  HENT:  Head: Normocephalic and atraumatic.  Neck: Normal  range of motion.  Pulmonary/Chest: Effort normal and breath sounds normal. No respiratory distress. She has no wheezes.  Lymphadenopathy:    She has no cervical adenopathy.  Neurological: She is alert and oriented to person, place, and time.  Skin: Skin is warm and dry. No rash noted. She is not diaphoretic.  Psychiatric: Her behavior is normal.  Nursing note and vitals reviewed.   ED Course  Procedures (including critical care time)  Labs Review Labs Reviewed - No data to display  Imaging Review No results found.   Visual Acuity Review  Right Eye Distance:   Left Eye Distance:   Bilateral Distance:    Right Eye Near:   Left Eye Near:    Bilateral Near:         MDM   1. Medication reaction, initial encounter   2. BV (bacterial vaginosis)    Patient is improving off Flagyl and her exam and vitals are stable. She is non-toxic. Her vaginal symptoms have improved and therefore no need for further medications. Should she develop worsening emergent symptoms, please go to the ER.     Bjorn Pippin, PA-C 07/27/15 1616

## 2015-07-27 NOTE — ED Notes (Signed)
Reports she was seen here on 8/27; dx w/vaginal prolapse and BV Given Flagyl w/no relief Reports she has now developed rapid palpitations, HTN, SOB, joint aching, decreased appetite, dry mouth, metal taste Alert... No acute distress.

## 2015-07-27 NOTE — Discharge Instructions (Signed)
Bacterial Vaginosis Bacterial vaginosis is a vaginal infection that occurs when the normal balance of bacteria in the vagina is disrupted. It results from an overgrowth of certain bacteria. This is the most common vaginal infection in women of childbearing age. Treatment is important to prevent complications, especially in pregnant women, as it can cause a premature delivery. CAUSES  Bacterial vaginosis is caused by an increase in harmful bacteria that are normally present in smaller amounts in the vagina. Several different kinds of bacteria can cause bacterial vaginosis. However, the reason that the condition develops is not fully understood. RISK FACTORS Certain activities or behaviors can put you at an increased risk of developing bacterial vaginosis, including:  Having a new sex partner or multiple sex partners.  Douching.  Using an intrauterine device (IUD) for contraception. Women do not get bacterial vaginosis from toilet seats, bedding, swimming pools, or contact with objects around them. SIGNS AND SYMPTOMS  Some women with bacterial vaginosis have no signs or symptoms. Common symptoms include:  Grey vaginal discharge.  A fishlike odor with discharge, especially after sexual intercourse.  Itching or burning of the vagina and vulva.  Burning or pain with urination. DIAGNOSIS  Your health care provider will take a medical history and examine the vagina for signs of bacterial vaginosis. A sample of vaginal fluid may be taken. Your health care provider will look at this sample under a microscope to check for bacteria and abnormal cells. A vaginal pH test may also be done.  TREATMENT  Bacterial vaginosis may be treated with antibiotic medicines. These may be given in the form of a pill or a vaginal cream. A second round of antibiotics may be prescribed if the condition comes back after treatment.  HOME CARE INSTRUCTIONS   Only take over-the-counter or prescription medicines as  directed by your health care provider.  If antibiotic medicine was prescribed, take it as directed. Make sure you finish it even if you start to feel better.  Do not have sex until treatment is completed.  Tell all sexual partners that you have a vaginal infection. They should see their health care provider and be treated if they have problems, such as a mild rash or itching.  Practice safe sex by using condoms and only having one sex partner. SEEK MEDICAL CARE IF:   Your symptoms are not improving after 3 days of treatment.  You have increased discharge or pain.  You have a fever. MAKE SURE YOU:   Understand these instructions.  Will watch your condition.  Will get help right away if you are not doing well or get worse. FOR MORE INFORMATION  Centers for Disease Control and Prevention, Division of STD Prevention: Appraiser[employer]raud[employer] American Sexual Health Association (ASHA): www.ashastd.org  Document Released: 11/08/2005 Document Revised: 08/29/2013 Document Reviewed: 06/20/2013 [facility] Patient Information 2015 [place], [place]. This information is not intended to replace advice given to you by your health care provider. Make sure you discuss any questions you have with your health care provider.    It appears as though you are having an intolerance to the Metronidazole and I am glad you are getting better off meds today. You may stop this. At this time you do not need more medications.  If your symptoms worsen please go the ER.

## 2015-07-30 ENCOUNTER — Encounter (HOSPITAL_COMMUNITY): Payer: Medicare Other

## 2015-08-01 ENCOUNTER — Encounter (HOSPITAL_COMMUNITY): Payer: Medicare Other

## 2015-08-01 ENCOUNTER — Ambulatory Visit (INDEPENDENT_AMBULATORY_CARE_PROVIDER_SITE_OTHER): Payer: Medicare Other | Admitting: Internal Medicine

## 2015-08-01 ENCOUNTER — Encounter: Payer: Self-pay | Admitting: Internal Medicine

## 2015-08-01 VITALS — BP 157/84 | HR 80 | Temp 98.3°F | Wt 339.1 lb

## 2015-08-01 DIAGNOSIS — N62 Hypertrophy of breast: Secondary | ICD-10-CM | POA: Diagnosis not present

## 2015-08-01 DIAGNOSIS — B9689 Other specified bacterial agents as the cause of diseases classified elsewhere: Secondary | ICD-10-CM | POA: Diagnosis not present

## 2015-08-01 DIAGNOSIS — Z0000 Encounter for general adult medical examination without abnormal findings: Secondary | ICD-10-CM

## 2015-08-01 DIAGNOSIS — Z23 Encounter for immunization: Secondary | ICD-10-CM

## 2015-08-01 DIAGNOSIS — N760 Acute vaginitis: Secondary | ICD-10-CM

## 2015-08-01 NOTE — Progress Notes (Signed)
Patient ID: CHIANNE BYRNS, female   DOB: February 11, 1956, 59 y.o.   MRN: [id number]   Subjective:   Patient ID: JANACE DECKER female   DOB: August 28, 1956 59 y.o.   MRN: [id number]  HPI: Ms.Gurpreet A Michl is a 59 y.o. female with PMH of HTN, Obesity, s/p CABG x 5 (12/2013) who presents for follow up visit after being seen at urgent care on 07/27/2015. She was diagnosed with bacterial vaginosis and started on flagyl on 8/30. Patient went to urgent care when she notice side effects after starting Flagyl that included palpitations, dry mouth, mild SOB, elevated blood pressure, and metallic taste in the mouth. She discontinued Flagyl, and began to feel better. She feels near her baseline today, with exception of some continued urgency and frequency which is likely due to her recently diagnosed vaginal prolapse. She is scheduled to see her gynecologist on 9/16 for this. She denies any dysuria/burning. She has no other complaints.  Patient also requests referral for breast reduction surgery. She has complaints of back, shoulder, and anterior chest wall pain as well as heaviness and discomfort.  Patient is concerned about varicose vein at CABG surgical site. She reports seeing dermatology for sclerosing injections. She went to see vein specialists, but was not seen as she was told they were focused on lower extremity varicosities. Patient will try to obtain appointment with Dr. Prescott Gum who performed her surgery for management.  Please see problem list for details.  Past Medical History  Diagnosis Date  . Seasonal allergies   . Asthma     History of  . Hypertension   . Iron deficiency 12-07-2011  . Anemia   . GERD (gastroesophageal reflux disease)   . Heart murmur   . Osteoporosis   . PONV (postoperative nausea and vomiting)   . Dysrhythmia     occ palpitations   . Sleep apnea     no CPAP machine; sleep study 02/2010 REM AHI 61.7/hr, total sleep REM 14.8/hr  . Headache(784.0)     occasional   .  Osteoarthritis   . Morbid obesity   . Acute urinary retention s/p Foley 01/07/2012  . Hemorrhoids, internal, with bleeding & prolapse 12/13/2011  . Myocardial infarction     unsure when; per cardiologist report  . Chronic headaches   . Coronary artery disease   . Anginal pain   . Dyslipidemia   . S/P CABG (coronary artery bypass graft)     x5 - LIMA to LAD, SVG to diagonal, SVG to OM1, SVG to OM2, SVG to PDA (Dr. Prescott Gum)  . Postmenopausal bleeding   . Neuropathy   . Blood dyscrasia     ABNORMAL VAGINAL BLEEDING  . Uterine cancer 7/15    clinical stage IA grade 1 endometrioid endometrial cancer   Current Outpatient Prescriptions  Medication Sig Dispense Refill  . albuterol (PROVENTIL HFA;VENTOLIN HFA) 108 (90 BASE) MCG/ACT inhaler Inhale 2 puffs into the lungs every 6 (six) hours as needed for wheezing or shortness of breath. 1 Inhaler 2  . aspirin 325 MG tablet Take 162.5 mg by mouth daily.    . CRESTOR 20 MG tablet TAKE 1 TABLET BY MOUTH EVERY DAY 30 tablet 4  . [facility] SENNA PLUS 8.6-50 MG per tablet Take 1 tablet by mouth at bedtime as needed (constipation).   5  . cycloSPORINE (RESTASIS) 0.05 % ophthalmic emulsion Place 1 drop into both eyes 2 (two) times daily. 0.4 mL 2  . diclofenac sodium (VOLTAREN) 1 %  GEL Apply 2 g topically 2 (two) times daily as needed (joint pain).    Marland Kitchen esomeprazole (NEXIUM) 40 MG capsule TAKE ONE CAPSULE BY MOUTH EVERY DAY 30 capsule 3  . ferrous sulfate 325 (65 FE) MG tablet Take 1 tablet (325 mg total) by mouth 3 (three) times daily with meals. 90 tablet 3  . FLUOCINOLONE ACETONIDE OT Place 1 drop in ear(s) daily as needed.    . fluticasone (FLONASE) 50 MCG/ACT nasal spray Place 1 spray into both nostrils daily as needed for allergies or rhinitis.    . furosemide (LASIX) 20 MG tablet Take 20 mg by mouth every other day. PRN    . guaiFENesin-dextromethorphan (ROBITUSSIN DM) 100-10 MG/5ML syrup Take 5 mLs by mouth every 6 (six) hours as needed for cough. 118  mL 0  . ipratropium (ATROVENT) 0.06 % nasal spray Place 2 sprays into both nostrils 4 (four) times daily. 15 mL 1  . irbesartan (AVAPRO) 150 MG tablet Take 1 tablet (150 mg total) by mouth daily. 30 tablet 11  . metoprolol tartrate (LOPRESSOR) 25 MG tablet Take 25 mg by mouth 2 (two) times daily.    Marland Kitchen omeprazole (PRILOSEC) 40 MG capsule Take 40 mg by mouth daily.     No current facility-administered medications for this visit.   Family History  Problem Relation Age of Onset  . Colon cancer Neg Hx   . Hypertension Sister   . Heart failure Sister   . Heart disease Sister   . Diabetes type II Sister   . Kidney disease Brother     hypertension  . Heart attack Mother 31  . Heart disease Mother   . Hypertension Mother   . Diabetes Mother   . Hypertension Child    Social History   Social History  . Marital Status: Single    Spouse Name: N/A  . Number of Children: 2  . Years of Education: N/A   Occupational History  . Retired    Social History Main Topics  . Smoking status: Never Smoker   . Smokeless tobacco: Never Used  . Alcohol Use: No  . Drug Use: No  . Sexual Activity: Not Currently   Other Topics Concern  . None   Social History Narrative   Lives alone in a one story home.  Has 2 children.     Retired Regulatory affairs officer.     Education: 2 years of college.   Review of Systems: Review of Systems  Constitutional: Negative for fever and chills.  Respiratory: Negative for cough, sputum production, shortness of breath and wheezing.   Cardiovascular: Negative for chest pain, palpitations and leg swelling.  Gastrointestinal: Negative for nausea, vomiting, abdominal pain, diarrhea and constipation.  Genitourinary: Positive for urgency and frequency. Negative for dysuria and flank pain.  Skin: Negative for itching and rash.    Objective:  Physical Exam: Filed Vitals:   08/01/15 1355  BP: 157/84  Pulse: 80  Temp: 98.3 F (36.8 C)  TempSrc: Oral  Weight: 339 lb 1.6 oz  (153.815 kg)  SpO2: 100%   Physical Exam  Constitutional: She appears well-developed and well-nourished.  Obese body habitus.  HENT:  Head: Normocephalic and atraumatic.  Cardiovascular:  Distant heart sounds. RRR, no murmur/rub/gallop appreciated.  Pulmonary/Chest: Effort normal and breath sounds normal. No respiratory distress. She has no wheezes. She has no rales.  Abdominal: Soft. There is no tenderness.  Musculoskeletal: Normal range of motion. She exhibits no edema or tenderness.  Surgical scar at left knee.  Skin:  Surgical scar at sternum with varicose vein.  Psychiatric: She has a normal mood and affect.    Assessment & Plan:  Please see problem-based charting for assessment and plan.

## 2015-08-01 NOTE — Assessment & Plan Note (Signed)
Flu shot given today

## 2015-08-01 NOTE — Patient Instructions (Addendum)
Thank you for your visit [name].  I have sent a referral to Dr. Hoyle Barr office for breast reduction surgery.  Please check your pharmacy to see if they have a refill available for your blood pressure medication Irbesartan. If not, please call us for a refill.  Your symptoms were likely due to the antibiotic you were taking, Metronidazole. Try to avoid this medication in the future.  We are giving you the flu shot today.

## 2015-08-01 NOTE — Assessment & Plan Note (Signed)
Patient seeking referral for breast reduction surgery due to back, shoulder, chest wall pain, heaviness, and discomfort. Patient has physician in mind that performed surgery on an acquaintance. -Referral to Dr. Irene Limbo [facility]

## 2015-08-01 NOTE — Assessment & Plan Note (Signed)
Patient treated for BV found during visit to urgent care with Flagyl. She had side effects of palpitations, metallic taste in mouth, SOB, dry mouth, and subjective elevated blood pressure. Her symptoms resolved when she self-discontinued flagyl use after several days therapy. She denies any dysuria/burning or pruritis.  Her symptoms likely secondary to reaction to Metronidazole (flagyl) and will need to avoid it's use in the future.

## 2015-08-04 ENCOUNTER — Encounter (HOSPITAL_COMMUNITY): Payer: Medicare Other

## 2015-08-04 NOTE — Progress Notes (Signed)
Internal Medicine Clinic Attending  I saw and evaluated the patient.  I personally confirmed the key portions of the history and exam documented by Dr. Patel,Vishal and I reviewed pertinent patient test results.  The assessment, diagnosis, and plan were formulated together and I agree with the documentation in the resident's note.  

## 2015-08-06 ENCOUNTER — Encounter (HOSPITAL_COMMUNITY): Payer: Medicare Other

## 2015-08-08 ENCOUNTER — Ambulatory Visit (INDEPENDENT_AMBULATORY_CARE_PROVIDER_SITE_OTHER): Payer: Medicare Other | Admitting: Obstetrics & Gynecology

## 2015-08-08 ENCOUNTER — Encounter: Payer: Self-pay | Admitting: Obstetrics & Gynecology

## 2015-08-08 ENCOUNTER — Encounter (HOSPITAL_COMMUNITY): Payer: Medicare Other

## 2015-08-08 VITALS — BP 143/76 | HR 66 | Temp 98.2°F | Ht 68.0 in | Wt 336.4 lb

## 2015-08-08 DIAGNOSIS — N898 Other specified noninflammatory disorders of vagina: Secondary | ICD-10-CM

## 2015-08-08 DIAGNOSIS — N62 Hypertrophy of breast: Secondary | ICD-10-CM | POA: Diagnosis not present

## 2015-08-08 DIAGNOSIS — N8111 Cystocele, midline: Secondary | ICD-10-CM | POA: Diagnosis not present

## 2015-08-08 DIAGNOSIS — M545 Low back pain: Secondary | ICD-10-CM | POA: Diagnosis not present

## 2015-08-08 DIAGNOSIS — Z6841 Body Mass Index (BMI) 40.0 and over, adult: Secondary | ICD-10-CM | POA: Diagnosis not present

## 2015-08-08 DIAGNOSIS — M546 Pain in thoracic spine: Secondary | ICD-10-CM | POA: Diagnosis not present

## 2015-08-08 NOTE — Patient Instructions (Signed)
Cystocele Repair Cystocele repair is surgery to remove a cystocele, which is a bulging, drooping area (hernia) of the bladder that extends into the vagina. This bulging occurs on the top front wall of the vagina. LET [facility] CARE PROVIDER KNOW ABOUT:   Any allergies you have.  All medicines you are taking, including vitamins, herbs, eye drops, creams, and over-the-counter medicines.  Use of steroids (by mouth or creams).  Previous problems you or members of your family have had with the use of anesthetics.  Any blood disorders you have.  Previous surgeries you have had.  Medical conditions you have.  Possibility of pregnancy, if this applies. RISKS AND COMPLICATIONS  Generally, this is a safe procedure. However, as with any procedure, complications can occur. Possible complications include:  Excessive bleeding.  Infection.  Injury to surrounding structures.  Problems related to anesthetics. The risks will vary depending on the type of anesthetic given.  Problems with the urinary catheter after surgery, such as blockage.  Return of the cystocele. BEFORE THE PROCEDURE   Ask your health care provider about changing or stopping your regular medicines. You may need to stop taking certain medicines 1 week before surgery.  Do not eat or drink anything after midnight the night before surgery.  If you smoke, do not smoke for at least 2 weeks before the surgery.  Do not drink any alcohol for 3 days before the surgery.  Arrange for someone to drive you home after your hospital stay and to help you with activities during recovery. PROCEDURE   You will be given a medicine that makes you sleep through the procedure (general anesthetic) or a medicine injected into your spine that numbs your body below the waist (spinal or epidural anesthetic). You will be asleep or be numbed through the entire procedure.  A thin, flexible tube (Foley catheter) will be placed in your bladder to  drain urine during and after the surgery.  The surgery is performed through the vagina. The front wall of the vagina is opened up, and the muscle between the bladder and vagina is pulled up to its normal position. This is reinforced with stitches or a piece of mesh. This removes the hernia so that the top of the vagina does not fall into the opening of the vagina.  The cut on the front wall of the vagina is then closed with absorbable stitches that do not need to be removed. AFTER THE PROCEDURE   You will be taken to a recovery area where your progress will be closely watched. Your breathing, blood pressure, and pulse (vital signs) will be checked often. When you are stable, you will be taken to a regular hospital room.  You will have a catheter in place to drain your bladder. This will stay in place for 2-7 days or until your bladder is working properly on its own.  You may have gauze packing in the vagina. This will be removed 1-2 days after the surgery.  You will be given pain medicine as needed and may be given a medicine that kills germs (antibiotic).  You will likely need to stay in the hospital for 1-2 days. Document Released: 11/05/2000 Document Revised: 07/11/2013 Document Reviewed: 04/27/2013 [facility] Patient Information 2015 [place], [place]. This information is not intended to replace advice given to you by your health care provider. Make sure you discuss any questions you have with your health care provider.

## 2015-08-11 ENCOUNTER — Encounter (HOSPITAL_COMMUNITY): Payer: Medicare Other

## 2015-08-12 ENCOUNTER — Encounter: Payer: Self-pay | Admitting: Obstetrics & Gynecology

## 2015-08-12 NOTE — Progress Notes (Signed)
Patient ID: [name], female   DOB: 13-Jun-1956, 59 y.o.   MRN: [id number] History:  59 y.o. [id number] here today for eval of a vaginal discharge that has resolved.  She reports that she was given Flagyl for BV and had a severe 'allergic reaction.'  She was eval by her primary care physician for that butm, was also told that she has 'prolapse'  She denies sx but, wanted to be checked out..  She reports occ urinary urge but, only when she gets home.  She reports that if she's out running errancd she does not have to stop to void freq.  She denies incontinence.  The following portions of the patient's history were reviewed and updated as appropriate: allergies, current medications, past family history, past medical history, past social history, past surgical history and problem list.  Review of Systems:  Pertinent items are noted in HPI.  Objective:  Physical Exam Blood pressure 143/76, pulse 66, temperature 98.2 F (36.8 C), temperature source Oral, height 5\' 8"  (1.727 m), weight 336 lb 6.4 oz (152.59 kg), last menstrual period 01/13/2014. Gen: NAD Abd: Soft, nontender and nondistended Pelvic: Normal appearing external genitalia; Grade I cystocele.   Normal appearing vaginal mucosa.  Cuff well healed.  Normal discharge.  No adnexal tenderness   Assessment & Plan:  Mild asymptomatic cystocele- Pt declines further eval or tx BV- suspect resolved. Wet smear today   F/u in 6 months or sooner prn     Pt seen 08/08/2015.  Note completed 08/12/2015

## 2015-08-13 ENCOUNTER — Encounter (HOSPITAL_COMMUNITY): Payer: Medicare Other

## 2015-08-15 ENCOUNTER — Encounter (HOSPITAL_COMMUNITY): Payer: Medicare Other

## 2015-08-18 ENCOUNTER — Encounter (HOSPITAL_COMMUNITY): Payer: Medicare Other

## 2015-08-20 ENCOUNTER — Telehealth (HOSPITAL_COMMUNITY): Payer: Self-pay | Admitting: *Deleted

## 2015-08-20 ENCOUNTER — Encounter (HOSPITAL_COMMUNITY): Admission: RE | Admit: 2015-08-20 | Payer: Medicare Other | Source: Ambulatory Visit

## 2015-08-22 ENCOUNTER — Telehealth (HOSPITAL_COMMUNITY): Payer: Self-pay | Admitting: *Deleted

## 2015-08-22 ENCOUNTER — Encounter (HOSPITAL_COMMUNITY): Payer: Medicare Other

## 2015-08-25 ENCOUNTER — Encounter (HOSPITAL_COMMUNITY): Payer: Medicare Other

## 2015-08-27 ENCOUNTER — Encounter (HOSPITAL_COMMUNITY): Payer: Medicare Other

## 2015-08-29 ENCOUNTER — Encounter (HOSPITAL_COMMUNITY): Payer: Medicare Other

## 2015-09-01 ENCOUNTER — Encounter (HOSPITAL_COMMUNITY): Payer: Medicare Other

## 2015-09-01 ENCOUNTER — Other Ambulatory Visit: Payer: Self-pay | Admitting: Internal Medicine

## 2015-09-01 NOTE — Telephone Encounter (Signed)
REFILL 

## 2015-09-03 ENCOUNTER — Encounter (HOSPITAL_COMMUNITY): Payer: Medicare Other

## 2015-09-05 ENCOUNTER — Encounter (HOSPITAL_COMMUNITY): Payer: Medicare Other

## 2015-09-08 ENCOUNTER — Encounter (HOSPITAL_COMMUNITY): Payer: Medicare Other

## 2015-09-10 ENCOUNTER — Encounter (HOSPITAL_COMMUNITY): Payer: Medicare Other

## 2015-09-12 ENCOUNTER — Encounter (HOSPITAL_COMMUNITY): Payer: Medicare Other

## 2015-09-15 ENCOUNTER — Encounter (HOSPITAL_COMMUNITY): Payer: Medicare Other

## 2015-09-17 ENCOUNTER — Encounter (HOSPITAL_COMMUNITY): Payer: Medicare Other

## 2015-09-19 ENCOUNTER — Encounter (HOSPITAL_COMMUNITY): Payer: Medicare Other

## 2015-09-22 ENCOUNTER — Encounter (HOSPITAL_COMMUNITY): Payer: Medicare Other

## 2015-09-29 NOTE — H&P (Signed)
  Subjective:    Patient ID: [name] is a 59 y.o. female.  HPI  Plan for breast reduction. Patient reports 48 K bra by professional measurement, but unable to find this size and uses G cup. Has been this size for years. Relates her breast size as contributing to several year history of shoulder, upper and lower back pain with associated spasms of muscled neck and upper back, numbness hands, worse on left. Also reports burning of shoulders. Pain not relieved with OTC or prescription pain medication or regular therapy; has completed PT and is in cardiac rehab presently. She also reports frequent rash beneath breasts, controls with powder.  Of note had CABG 2015, notes developed thick painful scar and was treated by dermatologist with steroids. Developed wide scar, relates this to her breast size.   Highest wt 350 lb. Last MMG 02/2015, normal. No family history breast, colon, or ovarian ca. Patient herself with history uterine cancer.      Objective:   Physical Exam  Constitutional: She is oriented to person, place, and time.  Cardiovascular: Normal rate.  Pulmonary/Chest: Effort normal.  Abdominal: Soft.  obese  Genitourinary: No breast discharge.  Grade 3 ptosis bilat, right > left volume SN to nipple R 51.5 L 51 cm BW R 26 L 25 cm Nipple to IMF R 20 L 19 cm  Neurological: She is alert and oriented to person, place, and time.  Skin:  Fitzpatrick 6  +shoulder grooving + kyphosis     Assessment:     Macromastia  Chronic neck and back pain Morbid obesity    Plan:     Reviewed reduction with anchor type scars, overnight hospital stay and drains, post operative visits and limitations, recovery. With her breast size and ptosis, I recommend breast amputation and full thickness skin graft. Discussed that this would produce asensate NAC that will not stimulate. In addition can experience color changes with hyper or hypopigmentation. If this occurs may require additional  procedures to correct. We discussed that the hypertrophic scar she experienced post CABG could also develop post breast reduction, the IMF scar being higher risk. Discussed the widening of chest scar likely in part due to steroid injection.   Reviewed additional risks wound healing problems, asymmetry, incidental carcinoma, changes with wt gain/loss, aging, unacceptable cosmetic appearance reviewed. Counseled cannot assure bra size post op.  Anticipate over 1522 g reduction from each breast.          Tracy Limbo, MD [facility] [phone]

## 2015-09-30 ENCOUNTER — Other Ambulatory Visit: Payer: Self-pay | Admitting: Internal Medicine

## 2015-10-01 ENCOUNTER — Encounter: Payer: Self-pay | Admitting: *Deleted

## 2015-10-02 ENCOUNTER — Encounter (HOSPITAL_COMMUNITY): Payer: Self-pay

## 2015-10-02 ENCOUNTER — Encounter (HOSPITAL_COMMUNITY)
Admission: RE | Admit: 2015-10-02 | Discharge: 2015-10-02 | Disposition: A | Discharge status: Home / Self Care | Payer: Medicare Other | Source: Ambulatory Visit | Attending: Plastic Surgery | Admitting: Plastic Surgery

## 2015-10-02 DIAGNOSIS — N62 Hypertrophy of breast: Secondary | ICD-10-CM | POA: Insufficient documentation

## 2015-10-02 DIAGNOSIS — M549 Dorsalgia, unspecified: Secondary | ICD-10-CM | POA: Diagnosis not present

## 2015-10-02 DIAGNOSIS — Z01818 Encounter for other preprocedural examination: Secondary | ICD-10-CM | POA: Insufficient documentation

## 2015-10-02 HISTORY — DX: Personal history of urinary calculi: Z87.442

## 2015-10-02 LAB — BASIC METABOLIC PANEL
ANION GAP: 7 (ref 5–15)
BUN: 9 mg/dL (ref 6–20)
CALCIUM: 9 mg/dL (ref 8.9–10.3)
CO2: 26 mmol/L (ref 22–32)
Chloride: 104 mmol/L (ref 101–111)
Creatinine, Ser: 0.8 mg/dL (ref 0.44–1.00)
GFR calc Af Amer: >60 mL/min (ref >60)
Glucose, Bld: 95 mg/dL (ref 65–99)
POTASSIUM: 3.8 mmol/L (ref 3.5–5.1)
SODIUM: 137 mmol/L (ref 135–145)

## 2015-10-02 LAB — CBC WITH DIFFERENTIAL/PLATELET
BASOS ABS: 0 10*3/uL (ref 0.0–0.1)
BASOS PCT: 0 %
EOS PCT: 2 %
Eosinophils Absolute: 0.2 10*3/uL (ref 0.0–0.7)
HEMATOCRIT: 38.8 % (ref 36.0–46.0)
Hemoglobin: 12.9 g/dL (ref 12.0–15.0)
LYMPHS PCT: 42 %
Lymphs Abs: 3.4 10*3/uL (ref 0.7–4.0)
MCH: 27.3 pg (ref 26.0–34.0)
MCHC: 33.2 g/dL (ref 30.0–36.0)
MCV: 82 fL (ref 78.0–100.0)
MONO ABS: 0.5 10*3/uL (ref 0.1–1.0)
Monocytes Relative: 6 %
NEUTROS ABS: 4.2 10*3/uL (ref 1.7–7.7)
Neutrophils Relative %: 50 %
PLATELETS: 249 10*3/uL (ref 150–400)
RBC: 4.73 MIL/uL (ref 3.87–5.11)
RDW: 15.6 % — AB (ref 11.5–15.5)
WBC: 8.2 10*3/uL (ref 4.0–10.5)

## 2015-10-02 NOTE — Progress Notes (Signed)
PCP - Dr. Lalla Brothers Cardiologist - Dr. Debara Pickett  EKG - 06/19/15 - Epic CXR - denies  Echo - 12/2013 - Epic Stress test - denies Cardiac Cath - 12/2013 CABG- 12/2013  Patient denies chest pain and shortness of breath at PAT appointment.    Patient was positive for sleep apnea in 2011 but states she never wore a CPAP.  She states that after her open heart surgery, she has not had an issue with sleep apnea.  Patient was screened for sleep apnea at PAT appointment and was negative (score of 4).

## 2015-10-02 NOTE — Pre-Procedure Instructions (Signed)
    NINNIE PERKOWSKI  10/02/2015      [id number] [address], [place] [address] Phone: [phone] Fax: [phone]    Your procedure is scheduled on Friday, November 18th, 2016.  Report to [facility] Admitting at 9:45 A.M.  Call this number if you have problems the morning of surgery:  [phone]   Remember:  Do not eat food or drink liquids after midnight.   Take these medicines the morning of surgery with A SIP OF WATER: Albuterol inhaler (please bring with you), Restasis eye drop, Esomeprazole (Nexium), ear drop if needed, Fluticasone (Flonase) if needed, Atrovent, Metoprolol Tartrate (Lopressor), Omeprazole (Prilosec).  Stop taking: Aspirin, NSAIDs, Aleve, Naproxen, Ibuprofen, Advil, Motrin, BC's, Goody's, Fish oil, all herbal medications, and all vitamins.     Do not wear jewelry, make-up or nail polish.  Do not wear lotions, powders, or perfumes.  You may NOT wear deodorant.  Do not shave 48 hours prior to surgery.    Do not bring valuables to the hospital.  [facility] is not responsible for any belongings or valuables.  Contacts, dentures or bridgework may not be worn into surgery.  Leave your suitcase in the car.  After surgery it may be brought to your room.  For patients admitted to the hospital, discharge time will be determined by your treatment team.  Patients discharged the day of surgery will not be allowed to drive home.   Special instructions:  See attached.   Please read over the following fact sheets that you were given. Pain Booklet, Coughing and Deep Breathing and Surgical Site Infection Prevention

## 2015-10-03 NOTE — Progress Notes (Signed)
Anesthesia Chart Review:  Pt is 59 year old female scheduled for B breast reduction with free nipple graft on 10/10/2015 with Dr. Iran Planas.   Cardiologist is Dr. Debara Pickett, last office visit 06/20/15. Received primary care from [facility], who referred pt to Dr. Iran Planas for this surgery.   PMH includes:  CAD (s/p CABG 01/09/14: LIMA-LAD, SVG-diagonal, SVG-OM1, SVG-OM2, SVG-PDA), HTN, occasional palpitations, heart murmur, asthma, anemia, hyperlipidemia, OSA (no CPAP), GERD. Never smoker. BMI 51. S/p vaginal hysterectomy 06/03/14. S/p dental extractions 04/11/14.   Medications include: albuterol, ASA, crestor, nexium, iron, atrovent, irbesartan, metoprolol, prilosec.  Preoperative labs reviewed.     Chest x-ray 05/16/15 reviewed. Post CABG. Chronic interstitial prominence. No acute abnormalities.   EKG 06/19/15: NSR.   Carotid duplex US 01/05/14: Bilateral: intimal wall thickening CCA. Mild mixed plaque origin and proximal ICA and ECA. 1-39% ICA stenosis.  Echo 01/05/14:  - Left ventricle: Limited apical views. No gross regional wall motion abnormalities. Papillary muscle calcification noted. The cavity size was normal. Wall thickness was normal. Systolic function was normal. The estimated ejection fraction was in the range of 60% to 65%. Left ventricular diastolic function parameters were normal. - Aortic valve: Mildly calcified annulus. Mildly thickened leaflets. Transvalvular velocity was within the normal range. There was no stenosis. - Mitral valve: Calcified annulus. Trivial regurgitation. - Left atrium: The atrium was mildly dilated.  Cardiology visit within last 4 months. No CV sx documented at PAT. If no changes, I anticipate pt can proceed with surgery as scheduled.   Willeen Cass, FNP-BC [facility]/Anesthesiology Phone: [phone] 10/03/2015 2:57 PM

## 2015-10-09 DIAGNOSIS — H40013 Open angle with borderline findings, low risk, bilateral: Secondary | ICD-10-CM | POA: Diagnosis not present

## 2015-10-09 DIAGNOSIS — H18413 Arcus senilis, bilateral: Secondary | ICD-10-CM | POA: Diagnosis not present

## 2015-10-09 DIAGNOSIS — H2513 Age-related nuclear cataract, bilateral: Secondary | ICD-10-CM | POA: Diagnosis not present

## 2015-10-09 DIAGNOSIS — H35371 Puckering of macula, right eye: Secondary | ICD-10-CM | POA: Diagnosis not present

## 2015-10-09 MED ORDER — CHLORHEXIDINE GLUCONATE 4 % EX LIQD: 1.0000 "application " | Freq: Once | CUTANEOUS | Status: DC

## 2015-10-09 MED ORDER — CLINDAMYCIN PHOSPHATE 600 MG/50ML IV SOLN
600.0000 mg | INTRAVENOUS | Status: AC
  Administered 2015-10-10: 600 mg via INTRAVENOUS
  Filled 2015-10-09: qty 50

## 2015-10-10 ENCOUNTER — Encounter (HOSPITAL_COMMUNITY): Payer: Self-pay | Admitting: *Deleted

## 2015-10-10 ENCOUNTER — Ambulatory Visit (HOSPITAL_COMMUNITY): Payer: Medicare Other | Admitting: Emergency Medicine

## 2015-10-10 ENCOUNTER — Encounter (HOSPITAL_COMMUNITY)
Admission: RE | Disposition: A | Discharge status: Home / Self Care | Payer: Self-pay | Source: Ambulatory Visit | Attending: Plastic Surgery

## 2015-10-10 ENCOUNTER — Observation Stay (HOSPITAL_COMMUNITY)
Admission: RE | Admit: 2015-10-10 | Discharge: 2015-10-11 | Disposition: A | Discharge status: Home / Self Care | Payer: Medicare Other | Source: Ambulatory Visit | Attending: Plastic Surgery | Admitting: Plastic Surgery

## 2015-10-10 DIAGNOSIS — I10 Essential (primary) hypertension: Secondary | ICD-10-CM | POA: Insufficient documentation

## 2015-10-10 DIAGNOSIS — E785 Hyperlipidemia, unspecified: Secondary | ICD-10-CM | POA: Diagnosis not present

## 2015-10-10 DIAGNOSIS — K219 Gastro-esophageal reflux disease without esophagitis: Secondary | ICD-10-CM | POA: Diagnosis not present

## 2015-10-10 DIAGNOSIS — I2510 Atherosclerotic heart disease of native coronary artery without angina pectoris: Secondary | ICD-10-CM | POA: Insufficient documentation

## 2015-10-10 DIAGNOSIS — I739 Peripheral vascular disease, unspecified: Secondary | ICD-10-CM | POA: Insufficient documentation

## 2015-10-10 DIAGNOSIS — Z791 Long term (current) use of non-steroidal anti-inflammatories (NSAID): Secondary | ICD-10-CM | POA: Insufficient documentation

## 2015-10-10 DIAGNOSIS — N62 Hypertrophy of breast: Secondary | ICD-10-CM | POA: Diagnosis not present

## 2015-10-10 DIAGNOSIS — Z7951 Long term (current) use of inhaled steroids: Secondary | ICD-10-CM | POA: Diagnosis not present

## 2015-10-10 DIAGNOSIS — Z7982 Long term (current) use of aspirin: Secondary | ICD-10-CM | POA: Insufficient documentation

## 2015-10-10 DIAGNOSIS — M542 Cervicalgia: Secondary | ICD-10-CM | POA: Diagnosis not present

## 2015-10-10 DIAGNOSIS — M1990 Unspecified osteoarthritis, unspecified site: Secondary | ICD-10-CM | POA: Insufficient documentation

## 2015-10-10 DIAGNOSIS — R339 Retention of urine, unspecified: Secondary | ICD-10-CM | POA: Diagnosis not present

## 2015-10-10 DIAGNOSIS — J45909 Unspecified asthma, uncomplicated: Secondary | ICD-10-CM | POA: Diagnosis not present

## 2015-10-10 DIAGNOSIS — Z951 Presence of aortocoronary bypass graft: Secondary | ICD-10-CM | POA: Diagnosis not present

## 2015-10-10 DIAGNOSIS — G4733 Obstructive sleep apnea (adult) (pediatric): Secondary | ICD-10-CM | POA: Diagnosis not present

## 2015-10-10 DIAGNOSIS — Z6841 Body Mass Index (BMI) 40.0 and over, adult: Secondary | ICD-10-CM | POA: Diagnosis not present

## 2015-10-10 DIAGNOSIS — M545 Low back pain: Secondary | ICD-10-CM | POA: Diagnosis not present

## 2015-10-10 DIAGNOSIS — M549 Dorsalgia, unspecified: Secondary | ICD-10-CM | POA: Diagnosis not present

## 2015-10-10 HISTORY — PX: BREAST REDUCTION SURGERY: SHX8

## 2015-10-10 SURGERY — MAMMOPLASTY, REDUCTION
Anesthesia: General | Laterality: Bilateral

## 2015-10-10 MED ORDER — OXYCODONE HCL 5 MG/5ML PO SOLN: 5.0000 mg | Freq: Once | ORAL | Status: DC | PRN

## 2015-10-10 MED ORDER — ENOXAPARIN SODIUM 30 MG/0.3ML ~~LOC~~ SOLN
30.0000 mg | Freq: Two times a day (BID) | SUBCUTANEOUS | Status: DC
  Administered 2015-10-11: 30 mg via SUBCUTANEOUS
  Filled 2015-10-10 (×2): qty 0.3

## 2015-10-10 MED ORDER — MIDAZOLAM HCL 5 MG/5ML IJ SOLN
INTRAMUSCULAR | Status: DC | PRN
  Administered 2015-10-10: 2 mg via INTRAVENOUS

## 2015-10-10 MED ORDER — PANTOPRAZOLE SODIUM 40 MG PO TBEC
40.0000 mg | DELAYED_RELEASE_TABLET | Freq: Every day | ORAL | Status: DC
  Administered 2015-10-11: 40 mg via ORAL
  Filled 2015-10-10: qty 1

## 2015-10-10 MED ORDER — EPHEDRINE SULFATE 50 MG/ML IJ SOLN
INTRAMUSCULAR | Status: DC | PRN
  Administered 2015-10-10: 5 mg via INTRAVENOUS

## 2015-10-10 MED ORDER — PHENYLEPHRINE 40 MCG/ML (10ML) SYRINGE FOR IV PUSH (FOR BLOOD PRESSURE SUPPORT)
PREFILLED_SYRINGE | INTRAVENOUS | Status: AC
  Filled 2015-10-10: qty 10

## 2015-10-10 MED ORDER — ACETAMINOPHEN 160 MG/5ML PO SOLN: 325.0000 mg | ORAL | Status: DC | PRN

## 2015-10-10 MED ORDER — SENNOSIDES-DOCUSATE SODIUM 8.6-50 MG PO TABS: 1.0000 | ORAL_TABLET | Freq: Every evening | ORAL | Status: DC | PRN

## 2015-10-10 MED ORDER — HYDROMORPHONE HCL 1 MG/ML IJ SOLN
INTRAMUSCULAR | Status: AC
  Filled 2015-10-10: qty 1

## 2015-10-10 MED ORDER — FENTANYL CITRATE (PF) 250 MCG/5ML IJ SOLN
INTRAMUSCULAR | Status: AC
  Filled 2015-10-10: qty 5

## 2015-10-10 MED ORDER — BACITRACIN ZINC 500 UNIT/GM EX OINT
TOPICAL_OINTMENT | CUTANEOUS | Status: AC
  Filled 2015-10-10: qty 28.35

## 2015-10-10 MED ORDER — PHENYLEPHRINE HCL 10 MG/ML IJ SOLN
INTRAMUSCULAR | Status: DC | PRN
  Administered 2015-10-10: 80 ug via INTRAVENOUS

## 2015-10-10 MED ORDER — KCL IN DEXTROSE-NACL 20-5-0.45 MEQ/L-%-% IV SOLN
INTRAVENOUS | Status: DC
  Administered 2015-10-10: 18:00:00 via INTRAVENOUS
  Filled 2015-10-10: qty 1000

## 2015-10-10 MED ORDER — ROSUVASTATIN CALCIUM 20 MG PO TABS
20.0000 mg | ORAL_TABLET | Freq: Every day | ORAL | Status: DC
  Administered 2015-10-10: 20 mg via ORAL
  Filled 2015-10-10 (×2): qty 1

## 2015-10-10 MED ORDER — BACITRACIN ZINC 500 UNIT/GM EX OINT
TOPICAL_OINTMENT | CUTANEOUS | Status: DC | PRN
Start: 2015-10-10 — End: 2015-10-10
  Administered 2015-10-10: 1 via TOPICAL

## 2015-10-10 MED ORDER — ROCURONIUM BROMIDE 50 MG/5ML IV SOLN
INTRAVENOUS | Status: AC
  Filled 2015-10-10: qty 3

## 2015-10-10 MED ORDER — OXYCODONE HCL 5 MG PO TABS
5.0000 mg | ORAL_TABLET | ORAL | Status: DC | PRN
  Administered 2015-10-10 – 2015-10-11 (×2): 10 mg via ORAL
  Filled 2015-10-10 (×2): qty 2

## 2015-10-10 MED ORDER — NEOSTIGMINE METHYLSULFATE 10 MG/10ML IV SOLN
INTRAVENOUS | Status: DC | PRN
  Administered 2015-10-10: 4 mg via INTRAVENOUS

## 2015-10-10 MED ORDER — FENTANYL CITRATE (PF) 250 MCG/5ML IJ SOLN
INTRAMUSCULAR | Status: DC | PRN
  Administered 2015-10-10: 50 ug via INTRAVENOUS
  Administered 2015-10-10 (×2): 100 ug via INTRAVENOUS
  Administered 2015-10-10 (×5): 50 ug via INTRAVENOUS

## 2015-10-10 MED ORDER — LIDOCAINE HCL (CARDIAC) 20 MG/ML IV SOLN
INTRAVENOUS | Status: AC
  Filled 2015-10-10: qty 5

## 2015-10-10 MED ORDER — ONDANSETRON HCL 4 MG/2ML IJ SOLN
4.0000 mg | Freq: Four times a day (QID) | INTRAMUSCULAR | Status: DC | PRN
  Administered 2015-10-10 – 2015-10-11 (×2): 4 mg via INTRAVENOUS
  Filled 2015-10-10 (×2): qty 2

## 2015-10-10 MED ORDER — ALBUTEROL SULFATE (2.5 MG/3ML) 0.083% IN NEBU: 2.5000 mg | INHALATION_SOLUTION | Freq: Four times a day (QID) | RESPIRATORY_TRACT | Status: DC | PRN

## 2015-10-10 MED ORDER — PROPOFOL 10 MG/ML IV BOLUS
INTRAVENOUS | Status: DC | PRN
  Administered 2015-10-10: 200 mg via INTRAVENOUS

## 2015-10-10 MED ORDER — GLYCOPYRROLATE 0.2 MG/ML IJ SOLN
INTRAMUSCULAR | Status: DC | PRN
  Administered 2015-10-10: 0.1 mg via INTRAVENOUS
  Administered 2015-10-10: .6 mg via INTRAVENOUS
  Administered 2015-10-10: 0.1 mg via INTRAVENOUS

## 2015-10-10 MED ORDER — LACTATED RINGERS IV SOLN
INTRAVENOUS | Status: DC
  Administered 2015-10-10 (×2): via INTRAVENOUS

## 2015-10-10 MED ORDER — ONDANSETRON HCL 4 MG/2ML IJ SOLN
INTRAMUSCULAR | Status: AC
  Filled 2015-10-10: qty 4

## 2015-10-10 MED ORDER — IRBESARTAN 150 MG PO TABS
150.0000 mg | ORAL_TABLET | Freq: Every day | ORAL | Status: DC
  Administered 2015-10-11: 150 mg via ORAL
  Filled 2015-10-10: qty 1

## 2015-10-10 MED ORDER — LIDOCAINE HCL (CARDIAC) 20 MG/ML IV SOLN
INTRAVENOUS | Status: DC | PRN
  Administered 2015-10-10: 100 mg via INTRAVENOUS
  Administered 2015-10-10: 100 mg via INTRATRACHEAL

## 2015-10-10 MED ORDER — CYCLOSPORINE 0.05 % OP EMUL
1.0000 [drp] | Freq: Two times a day (BID) | OPHTHALMIC | Status: DC
  Administered 2015-10-10 – 2015-10-11 (×2): 1 [drp] via OPHTHALMIC
  Filled 2015-10-10 (×3): qty 1

## 2015-10-10 MED ORDER — ONDANSETRON 4 MG PO TBDP: 4.0000 mg | ORAL_TABLET | Freq: Four times a day (QID) | ORAL | Status: DC | PRN

## 2015-10-10 MED ORDER — PHENYLEPHRINE HCL 10 MG/ML IJ SOLN
10.0000 mg | INTRAVENOUS | Status: DC | PRN
  Administered 2015-10-10: 50 ug/min via INTRAVENOUS

## 2015-10-10 MED ORDER — ACETAMINOPHEN 10 MG/ML IV SOLN
INTRAVENOUS | Status: AC
  Filled 2015-10-10: qty 100

## 2015-10-10 MED ORDER — ACETAMINOPHEN 10 MG/ML IV SOLN
INTRAVENOUS | Status: DC | PRN
  Administered 2015-10-10: 1000 mg via INTRAVENOUS

## 2015-10-10 MED ORDER — 0.9 % SODIUM CHLORIDE (POUR BTL) OPTIME
TOPICAL | Status: DC | PRN
  Administered 2015-10-10 (×2): 1000 mL

## 2015-10-10 MED ORDER — ACETAMINOPHEN 325 MG PO TABS: 325.0000 mg | ORAL_TABLET | ORAL | Status: DC | PRN

## 2015-10-10 MED ORDER — HYDROMORPHONE HCL 1 MG/ML IJ SOLN
0.5000 mg | INTRAMUSCULAR | Status: DC | PRN
  Administered 2015-10-10 – 2015-10-11 (×3): 1 mg via INTRAVENOUS
  Filled 2015-10-10 (×3): qty 1

## 2015-10-10 MED ORDER — FLUTICASONE PROPIONATE 50 MCG/ACT NA SUSP: 1.0000 | Freq: Every day | NASAL | Status: DC | PRN

## 2015-10-10 MED ORDER — OXYCODONE HCL 5 MG PO TABS: 5.0000 mg | ORAL_TABLET | Freq: Once | ORAL | Status: DC | PRN

## 2015-10-10 MED ORDER — DIPHENHYDRAMINE HCL 25 MG PO CAPS
25.0000 mg | ORAL_CAPSULE | Freq: Four times a day (QID) | ORAL | Status: DC | PRN
Start: 2015-10-10 — End: 2015-10-11
  Administered 2015-10-10 – 2015-10-11 (×2): 25 mg via ORAL
  Filled 2015-10-10 (×2): qty 1

## 2015-10-10 MED ORDER — HYDROMORPHONE HCL 1 MG/ML IJ SOLN
0.2500 mg | INTRAMUSCULAR | Status: DC | PRN
  Administered 2015-10-10: 1 mg via INTRAVENOUS

## 2015-10-10 MED ORDER — ONDANSETRON HCL 4 MG/2ML IJ SOLN
INTRAMUSCULAR | Status: DC | PRN
  Administered 2015-10-10: 4 mg via INTRAVENOUS

## 2015-10-10 MED ORDER — ROCURONIUM BROMIDE 100 MG/10ML IV SOLN
INTRAVENOUS | Status: DC | PRN
  Administered 2015-10-10: 20 mg via INTRAVENOUS
  Administered 2015-10-10 (×4): 10 mg via INTRAVENOUS
  Administered 2015-10-10: 30 mg via INTRAVENOUS
  Administered 2015-10-10: 10 mg via INTRAVENOUS

## 2015-10-10 MED ORDER — CLINDAMYCIN PHOSPHATE 600 MG/50ML IV SOLN
600.0000 mg | Freq: Three times a day (TID) | INTRAVENOUS | Status: DC
  Administered 2015-10-10 – 2015-10-11 (×2): 600 mg via INTRAVENOUS
  Filled 2015-10-10 (×3): qty 50

## 2015-10-10 MED ORDER — MIDAZOLAM HCL 2 MG/2ML IJ SOLN
INTRAMUSCULAR | Status: AC
  Filled 2015-10-10: qty 2

## 2015-10-10 MED ORDER — METOPROLOL TARTRATE 25 MG PO TABS
25.0000 mg | ORAL_TABLET | Freq: Two times a day (BID) | ORAL | Status: DC
  Administered 2015-10-10 – 2015-10-11 (×2): 25 mg via ORAL
  Filled 2015-10-10 (×2): qty 1

## 2015-10-10 MED ORDER — IPRATROPIUM BROMIDE 0.06 % NA SOLN
2.0000 | Freq: Four times a day (QID) | NASAL | Status: DC
  Administered 2015-10-10 – 2015-10-11 (×3): 2 via NASAL
  Filled 2015-10-10: qty 15

## 2015-10-10 MED ORDER — OXYCODONE HCL 5 MG PO TABS
ORAL_TABLET | ORAL | Status: AC
  Filled 2015-10-10: qty 2

## 2015-10-10 SURGICAL SUPPLY — 60 items
BAG DECANTER FOR FLEXI CONT (MISCELLANEOUS) IMPLANT
BINDER BREAST LRG (GAUZE/BANDAGES/DRESSINGS) IMPLANT
BINDER BREAST MEDIUM (GAUZE/BANDAGES/DRESSINGS) IMPLANT
BINDER BREAST XLRG (GAUZE/BANDAGES/DRESSINGS) IMPLANT
BINDER BREAST XXLRG (GAUZE/BANDAGES/DRESSINGS) ×3 IMPLANT
BIOPATCH RED 1 DISK 7.0 (GAUZE/BANDAGES/DRESSINGS) IMPLANT
BIOPATCH RED 1IN DISK 7.0MM (GAUZE/BANDAGES/DRESSINGS)
BLADE HEX COATED 2.75 (ELECTRODE) IMPLANT
BLADE SURG 10 STRL SS (BLADE) ×12 IMPLANT
BLADE SURG 15 STRL LF DISP TIS (BLADE) ×1 IMPLANT
BLADE SURG 15 STRL SS (BLADE) ×2
BNDG GAUZE ELAST 4 BULKY (GAUZE/BANDAGES/DRESSINGS) ×6 IMPLANT
BRUSH SCRUB EZ PLAIN DRY (MISCELLANEOUS) ×6 IMPLANT
CANISTER SUCTION 1200CC (MISCELLANEOUS) ×3 IMPLANT
CHLORAPREP W/TINT 26ML (MISCELLANEOUS) ×6 IMPLANT
DECANTER SPIKE VIAL GLASS SM (MISCELLANEOUS) IMPLANT
DRAIN CHANNEL 15F RND FF W/TCR (WOUND CARE) ×6 IMPLANT
DRAPE CV SPLIT W-CLR ANES SCRN (DRAPES) ×3 IMPLANT
DRAPE LAPAROSCOPIC ABDOMINAL (DRAPES) IMPLANT
DRAPE PERI GROIN 82X75IN TIB (DRAPES) ×3 IMPLANT
DRSG ADAPTIC 3X8 NADH LF (GAUZE/BANDAGES/DRESSINGS) ×6 IMPLANT
DRSG PAD ABDOMINAL 8X10 ST (GAUZE/BANDAGES/DRESSINGS) ×12 IMPLANT
ELECT BLADE 4.0 EZ CLEAN MEGAD (MISCELLANEOUS) ×3
ELECT COATED BLADE 2.86 ST (ELECTRODE) ×3 IMPLANT
ELECT REM PT RETURN 9FT ADLT (ELECTROSURGICAL) ×3
ELECTRODE BLDE 4.0 EZ CLN MEGD (MISCELLANEOUS) ×1 IMPLANT
ELECTRODE REM PT RTRN 9FT ADLT (ELECTROSURGICAL) ×1 IMPLANT
EVACUATOR SILICONE 100CC (DRAIN) ×6 IMPLANT
GLOVE BIO SURGEON STRL SZ 6 (GLOVE) IMPLANT
GLOVE BIOGEL PI IND STRL 6.5 (GLOVE) ×4 IMPLANT
GLOVE BIOGEL PI IND STRL 7.0 (GLOVE) ×5 IMPLANT
GLOVE BIOGEL PI INDICATOR 6.5 (GLOVE) ×8
GLOVE BIOGEL PI INDICATOR 7.0 (GLOVE) ×10
GLOVE SURG SS PI 6.0 STRL IVOR (GLOVE) ×15 IMPLANT
GLOVE SURG SS PI 7.0 STRL IVOR (GLOVE) ×3 IMPLANT
GOWN STRL REUS W/ TWL LRG LVL3 (GOWN DISPOSABLE) ×4 IMPLANT
GOWN STRL REUS W/TWL LRG LVL3 (GOWN DISPOSABLE) ×8
LIQUID BAND (GAUZE/BANDAGES/DRESSINGS) ×6 IMPLANT
NEEDLE HYPO 25X1 1.5 SAFETY (NEEDLE) IMPLANT
NEEDLE SPNL 22GX3.5 QUINCKE BK (NEEDLE) IMPLANT
NS IRRIG 1000ML POUR BTL (IV SOLUTION) ×6 IMPLANT
PACK GENERAL/GYN (CUSTOM PROCEDURE TRAY) ×3 IMPLANT
PIN SAFETY STERILE (MISCELLANEOUS) ×3 IMPLANT
SPONGE LAP 18X18 X RAY DECT (DISPOSABLE) ×9 IMPLANT
STAPLER VISISTAT 35W (STAPLE) ×12 IMPLANT
SUT CHROMIC 5 0 P 3 (SUTURE) ×6 IMPLANT
SUT ETHILON 2 0 FS 18 (SUTURE) ×6 IMPLANT
SUT MNCRL AB 4-0 PS2 18 (SUTURE) ×9 IMPLANT
SUT SILK 4 0 PS 2 (SUTURE) ×6 IMPLANT
SUT VIC AB 3-0 PS2 18 (SUTURE) ×10
SUT VIC AB 3-0 PS2 18XBRD (SUTURE) ×5 IMPLANT
SUT VIC AB 3-0 SH 27 (SUTURE)
SUT VIC AB 3-0 SH 27X BRD (SUTURE) IMPLANT
SUT VICRYL 4-0 PS2 18IN ABS (SUTURE) ×6 IMPLANT
SYR 50ML LL SCALE MARK (SYRINGE) IMPLANT
SYR CONTROL 10ML LL (SYRINGE) IMPLANT
TOWEL OR 17X24 6PK STRL BLUE (TOWEL DISPOSABLE) ×6 IMPLANT
TUBE CONNECTING 20'X1/4 (TUBING)
TUBE CONNECTING 20X1/4 (TUBING) IMPLANT
YANKAUER SUCT BULB TIP NO VENT (SUCTIONS) IMPLANT

## 2015-10-10 NOTE — Anesthesia Postprocedure Evaluation (Signed)
  Anesthesia Post-op Note  Patient: [name]  Procedure(s) Performed: Procedure(s): BILATERAL MAMMARY REDUCTION  (BREAST) WITH FREE NIPPLE GRAFT (Bilateral)  Patient Location: PACU  Anesthesia Type: General   Level of Consciousness: awake, alert  and oriented  Airway and Oxygen Therapy: Patient Spontanous Breathing  Post-op Pain: mild  Post-op Assessment: Post-op Vital signs reviewed  Post-op Vital Signs: Reviewed  Last Vitals:  Filed Vitals:   10/10/15 1712  BP: 141/65  Pulse: 77  Temp: 36.4 C  Resp: 20    Complications: No apparent anesthesia complications

## 2015-10-10 NOTE — Op Note (Signed)
Operative Note   DATE OF OPERATION: 11.18.16  LOCATION: [facility] -observation  SURGICAL DIVISION: Plastic Surgery  PREOPERATIVE DIAGNOSES:  1. Macromastia 2. Neck pain 3. Back pain  POSTOPERATIVE DIAGNOSES:  same  PROCEDURE:  Bilateral breast reduction with free nipple grafts  SURGEON: Irene Limbo MD MBA  ASSISTANT: none  ANESTHESIA:  General.   EBL: 50 ml  COMPLICATIONS: None immediate.   INDICATIONS FOR PROCEDURE:  The patient, Rayen, is a 59 y.o. female born on [birth year], is here for bilateral breast reduction. Given her breast size and ptosis, plan free nipple grafts.   FINDINGS: Right breast 2110 g Left breast 1936 g  DESCRIPTION OF PROCEDURE:  The patient's was marked in standing position in the preoperative area to mark sternal notch, chest midline, and anterior axillary lines. The inframammary fold was marked on anterior surface of breast by palpation. With aid of wise pattern marker, location for new location nipple areolar complex (NAC) marked and 8 cm vertical limbs for resection marked. The patient was taken to the operating room. SCDs were placed and IV antibiotics were given. The patient's operative site was prepped and draped in a sterile fashion. A time out was performed and all information was confirmed to be correct. In supine position, inframammary folds and lateral limbs for resection marked. A superior soft tissue pedicle was designed bilaterally within area of new location of NAC in order to receive the free nipple grafts. This ares was de epithelialized. With 50 mm diameter marker, the NAC on left breast was marked and excised full thickness. Graft was prepared by removing subcutaneous fat to dermal tissue and placed in moist guaze and set aside. The superior pedicle was dissected toward chest wall with no undermining. Skin and breast tissue within Wise pattern marking was excised, with presevation mound soft tissue over central breast to aid with  projection. The patient was tailor tacked closed. I then directed my attention to right breast. The NAC was excised for use as graft. A superior pedicle of soft tissue designed based within new NAC location and deepithelialized. Resection of breast completed within remainder of Wise pattern markings. Patient brought to upright sitting position and tailor tacked closed. Additional soft tissue marked for resection by tailor tacking. Patient returned to supine position. Additional areas marked for resection excised. Bilateral breasts irrigated with saline and hemostasis obtained. 15 Fr JP drain placed within each breast cavity and secured with 2-0 nylon. Closure completed with 3-0 vicryl in dermis along inframammary fold. Vertical limbs closed with 4-0 vicryl in dermis. Skin closure completed with 4-0 monocryl subcuticular stitch and tissue adhesive applied to inframammary and vertical incisions. The prepared nipple areolar grafts were inset over the soft tissue pedicle with 5-0 chromic. The grafts were pie crusted. Adaptic, antibiotic ointment and sterile sponge bolsters secured with staples. Dry dressing and breast binder applied.  The patient was allowed to wake from anesthesia, extubated and taken to the recovery room in satisfactory condition.   SPECIMENS: Right and left breast tissue  DRAINS: 16 Fr JP in right and left breast  Irene Limbo, MD [facility] Plastic & Reconstructive Surgery [phone]

## 2015-10-10 NOTE — Interval H&P Note (Signed)
History and Physical Interval Note:  10/10/2015 7:03 AM  [name]  has presented today for surgery, with the diagnosis of [place]  The various methods of treatment have been discussed with the patient and family. After consideration of risks, benefits and other options for treatment, the patient has consented to  Procedure(s): BILATERAL MAMMARY REDUCTION  (BREAST) WITH FREE NIPPLE GRAFT (Bilateral) as a surgical intervention .  The patient's history has been reviewed, patient examined, no change in status, stable for surgery.  I have reviewed the patient's chart and labs.  Questions were answered to the patient's satisfaction.     Michaell Grider

## 2015-10-10 NOTE — Anesthesia Procedure Notes (Signed)
Procedure Name: Intubation Date/Time: 10/10/2015 11:42 AM Performed by: Merrilyn Puma B Pre-anesthesia Checklist: Patient identified, Emergency Drugs available, Suction available, Patient being monitored and Timeout performed Patient Re-evaluated:Patient Re-evaluated prior to inductionOxygen Delivery Method: Circle system utilized Preoxygenation: Pre-oxygenation with 100% oxygen Intubation Type: IV induction and Cricoid Pressure applied Ventilation: Mask ventilation without difficulty Laryngoscope Size: Mac and 3 Grade View: Grade II Tube type: Oral Tube size: 7.5 mm Number of attempts: 1 Airway Equipment and Method: Stylet (blankets placed under shoulders) Placement Confirmation: ETT inserted through vocal cords under direct vision,  positive ETCO2,  CO2 detector and breath sounds checked- equal and bilateral Secured at: 22 cm

## 2015-10-10 NOTE — Anesthesia Preprocedure Evaluation (Signed)
Anesthesia Evaluation  Patient identified by MRN, date of birth, ID band Patient awake    Reviewed: Allergy & Precautions, NPO status , Patient's Chart, lab work & pertinent test results  History of Anesthesia Complications (+) PONV  Airway Mallampati: II  TM Distance: >3 FB Neck ROM: Full    Dental  (+) Dental Advisory Given   Pulmonary asthma , sleep apnea ,           Cardiovascular hypertension, Pt. on medications and Pt. on home beta blockers + CAD, + Past MI, + CABG and + Peripheral Vascular Disease       Neuro/Psych  Headaches,    GI/Hepatic GERD  ,  Endo/Other  Morbid obesity  Renal/GU      Musculoskeletal  (+) Arthritis ,   Abdominal   Peds  Hematology   Anesthesia Other Findings   Reproductive/Obstetrics                             Anesthesia Physical Anesthesia Plan  ASA: III  Anesthesia Plan: General   Post-op Pain Management:    Induction: Intravenous  Airway Management Planned: Oral ETT  Additional Equipment:   Intra-op Plan:   Post-operative Plan: Extubation in OR  Informed Consent: I have reviewed the patients History and Physical, chart, labs and discussed the procedure including the risks, benefits and alternatives for the proposed anesthesia with the patient or authorized representative who has indicated his/her understanding and acceptance.   Dental advisory given  Plan Discussed with: Anesthesiologist and Surgeon  Anesthesia Plan Comments:         Anesthesia Quick Evaluation

## 2015-10-10 NOTE — Transfer of Care (Signed)
Immediate Anesthesia Transfer of Care Note  Patient: [name]  Procedure(s) Performed: Procedure(s): BILATERAL MAMMARY REDUCTION  (BREAST) WITH FREE NIPPLE GRAFT (Bilateral)  Patient Location: PACU  Anesthesia Type:General  Level of Consciousness: awake, patient cooperative and responds to stimulation  Airway & Oxygen Therapy: Patient Spontanous Breathing and Patient connected to nasal cannula oxygen  Post-op Assessment: Report given to RN and Post -op Vital signs reviewed and stable  Post vital signs: Reviewed and stable  Last Vitals:  Filed Vitals:   10/10/15 1712  BP: 141/65  Pulse: 77  Temp: 36.4 C  Resp: 20    Complications: No apparent anesthesia complications

## 2015-10-11 ENCOUNTER — Encounter: Payer: Self-pay | Admitting: Plastic Surgery

## 2015-10-11 DIAGNOSIS — Z791 Long term (current) use of non-steroidal anti-inflammatories (NSAID): Secondary | ICD-10-CM | POA: Diagnosis not present

## 2015-10-11 DIAGNOSIS — N62 Hypertrophy of breast: Secondary | ICD-10-CM | POA: Diagnosis not present

## 2015-10-11 DIAGNOSIS — I739 Peripheral vascular disease, unspecified: Secondary | ICD-10-CM | POA: Diagnosis not present

## 2015-10-11 DIAGNOSIS — K219 Gastro-esophageal reflux disease without esophagitis: Secondary | ICD-10-CM | POA: Diagnosis not present

## 2015-10-11 DIAGNOSIS — Z951 Presence of aortocoronary bypass graft: Secondary | ICD-10-CM | POA: Diagnosis not present

## 2015-10-11 DIAGNOSIS — J45909 Unspecified asthma, uncomplicated: Secondary | ICD-10-CM | POA: Diagnosis not present

## 2015-10-11 DIAGNOSIS — Z6841 Body Mass Index (BMI) 40.0 and over, adult: Secondary | ICD-10-CM | POA: Diagnosis not present

## 2015-10-11 DIAGNOSIS — E785 Hyperlipidemia, unspecified: Secondary | ICD-10-CM | POA: Diagnosis not present

## 2015-10-11 DIAGNOSIS — G4733 Obstructive sleep apnea (adult) (pediatric): Secondary | ICD-10-CM | POA: Diagnosis not present

## 2015-10-11 DIAGNOSIS — Z7951 Long term (current) use of inhaled steroids: Secondary | ICD-10-CM | POA: Diagnosis not present

## 2015-10-11 DIAGNOSIS — Z7982 Long term (current) use of aspirin: Secondary | ICD-10-CM | POA: Diagnosis not present

## 2015-10-11 DIAGNOSIS — M1990 Unspecified osteoarthritis, unspecified site: Secondary | ICD-10-CM | POA: Diagnosis not present

## 2015-10-11 DIAGNOSIS — I2510 Atherosclerotic heart disease of native coronary artery without angina pectoris: Secondary | ICD-10-CM | POA: Diagnosis not present

## 2015-10-11 DIAGNOSIS — I10 Essential (primary) hypertension: Secondary | ICD-10-CM | POA: Diagnosis not present

## 2015-10-11 DIAGNOSIS — R339 Retention of urine, unspecified: Secondary | ICD-10-CM | POA: Diagnosis not present

## 2015-10-11 MED ORDER — ALUM & MAG HYDROXIDE-SIMETH 200-200-20 MG/5ML PO SUSP
30.0000 mL | ORAL | Status: DC | PRN
  Administered 2015-10-11: 30 mL via ORAL
  Filled 2015-10-11: qty 30

## 2015-10-11 MED ORDER — PROMETHAZINE HCL 25 MG/ML IJ SOLN
12.5000 mg | Freq: Four times a day (QID) | INTRAMUSCULAR | Status: DC | PRN
  Filled 2015-10-11: qty 1

## 2015-10-11 MED ORDER — IBUPROFEN 800 MG PO TABS: 800.0000 mg | ORAL_TABLET | Freq: Two times a day (BID) | ORAL | Status: DC | PRN

## 2015-10-11 MED ORDER — OXYCODONE HCL 5 MG PO TABS: 5.0000 mg | ORAL_TABLET | ORAL | Status: DC | PRN

## 2015-10-11 NOTE — Progress Notes (Signed)
Pt refused to have foley cath placed, would like to continue to try and urinate on own. RN will continue to monitor pt output.

## 2015-10-11 NOTE — Discharge Planning (Signed)
AVS to patient and jp supplies, she verbalizes understanding. D'c to private car home with all personal belongings accompanied by family at 19.

## 2015-10-11 NOTE — Discharge Summary (Signed)
Physician Discharge Summary  Patient ID: [name] MRN: [id number] DOB/AGE: 61/26/1957 61 y.o.  Admit date: 10/10/2015 Discharge date: 10/11/2015  Admission Diagnoses: macromastia  Discharge Diagnoses:  Active Problems:   Macromastia   Discharged Condition: stable  Hospital Course: Patient postoperatively developed itching and nausea she feels related to dilaudid. She had episode emesis, this resolved by am POD#1 and was able to tolerate regular diet. She had urinary retention overnight but declined Foley. She was subsequently able to void. Patient takes 800 mg ibuprofen at home and this was resumed once tolerating diet; prescription also provided for oxycodone.  Treatments: surgery: bilateral breast reduction with free nipple grafts  Discharge Exam: Blood pressure 121/61, pulse 95, temperature 99.3 F (37.4 C), temperature source Oral, resp. rate 17, height 5\' 8"  (1.727 m), weight 154.677 kg (341 lb), last menstrual period 01/13/2014, SpO2 98 %. Incision/Wound: incisions dry, intact, sponge bolster dressing over nipple grafts dry intact. JP drains with serosanguinous material. Breasts soft with expected edema.   Disposition: 01-Home or Self Care  Discharge Instructions    Call MD for:  redness, tenderness, or signs of infection (pain, swelling, bleeding, redness, odor or green/yellow discharge around incision site)    Complete by:  As directed      Call MD for:  temperature >100.5    Complete by:  As directed      Discharge instructions    Complete by:  As directed   Breast binder or soft support bra all times except bathing. If binder is itching, ok to wear t shirt or camisole beneath.  Strip and record JP drains twice daily and bring log to clinic visit.  Keep yellow sponge dressings over nipples dry. Sponge bathe chest and breasts; all other incisions may get wet. Soap and water ok. No ointments or creams over incisions.  No strenuous activity, exercise or housework  for 2 weeks.     Driving Restrictions    Complete by:  As directed   No driving if taking narcotics     Lifting restrictions    Complete by:  As directed   No lifting greater than 5 lbs for 2 weeks.     Resume previous diet    Complete by:  As directed             Medication List    STOP taking these medications        esomeprazole 40 MG capsule  Commonly known as:  [place] these medications        albuterol 108 (90 BASE) MCG/ACT inhaler  Commonly known as:  PROVENTIL HFA;VENTOLIN HFA  Inhale 2 puffs into the lungs every 6 (six) hours as needed for wheezing or shortness of breath.     aspirin EC 81 MG tablet  Take 81 mg by mouth daily.     CRESTOR 20 MG tablet  Generic drug:  rosuvastatin  TAKE 1 TABLET BY MOUTH EVERY DAY     [employer] SENNA PLUS 8.6-50 MG tablet  Generic drug:  senna-docusate  Take 1 tablet by mouth at bedtime as needed (constipation).     cycloSPORINE 0.05 % ophthalmic emulsion  Commonly known as:  RESTASIS  Place 1 drop into both eyes 2 (two) times daily.     diclofenac sodium 1 % Gel  Commonly known as:  VOLTAREN  Apply 2 g topically 2 (two) times daily as needed (joint pain).     ferrous sulfate 325 (65 FE) MG tablet  Take 1 tablet (325 mg total) by mouth 3 (three) times daily with meals.     FLUOCINOLONE ACETONIDE OT  Place 1 drop in ear(s) daily as needed.     fluticasone 50 MCG/ACT nasal spray  Commonly known as:  FLONASE  Place 1 spray into both nostrils daily as needed for allergies or rhinitis.     ibuprofen 800 MG tablet  Commonly known as:  ADVIL,MOTRIN  Take 1 tablet (800 mg total) by mouth 2 (two) times daily as needed for mild pain or moderate pain. Take with food     ipratropium 0.06 % nasal spray  Commonly known as:  ATROVENT  Place 2 sprays into both nostrils 4 (four) times daily.     irbesartan 150 MG tablet  Commonly known as:  AVAPRO  Take 1 tablet (150 mg total) by mouth daily.     metoprolol tartrate 25  MG tablet  Commonly known as:  LOPRESSOR  TAKE 1 TABLET BY MOUTH TWICE A DAY     omeprazole 40 MG capsule  Commonly known as:  PRILOSEC  Take 40 mg by mouth daily.     oxyCODONE 5 MG immediate release tablet  Commonly known as:  Oxy IR/ROXICODONE  Take 1-2 tablets (5-10 mg total) by mouth every 4 (four) hours as needed for moderate pain.           Follow-up Information    Follow up with [facility], Arnoldo Hooker, MD In 1 week.   Specialty:  Plastic Surgery   Why:  as scheduled   Contact information:   [address] [place] [address] [phone]       Signed: Irene Limbo 10/11/2015, 10:22 AM

## 2015-10-13 ENCOUNTER — Encounter (HOSPITAL_COMMUNITY): Payer: Self-pay | Admitting: Plastic Surgery

## 2015-11-05 ENCOUNTER — Ambulatory Visit (HOSPITAL_COMMUNITY)
Admission: RE | Admit: 2015-11-05 | Discharge: 2015-11-05 | Disposition: A | Discharge status: Home / Self Care | Payer: Medicare Other | Source: Ambulatory Visit | Attending: Student in an Organized Health Care Education/Training Program | Admitting: Student in an Organized Health Care Education/Training Program

## 2015-11-05 ENCOUNTER — Encounter: Payer: Self-pay | Admitting: Internal Medicine

## 2015-11-05 ENCOUNTER — Ambulatory Visit (INDEPENDENT_AMBULATORY_CARE_PROVIDER_SITE_OTHER): Payer: Medicare Other | Admitting: Internal Medicine

## 2015-11-05 VITALS — BP 146/67 | HR 87 | Temp 98.0°F | Wt 335.9 lb

## 2015-11-05 DIAGNOSIS — R0609 Other forms of dyspnea: Secondary | ICD-10-CM | POA: Diagnosis present

## 2015-11-05 DIAGNOSIS — Z9889 Other specified postprocedural states: Secondary | ICD-10-CM

## 2015-11-05 DIAGNOSIS — Z9013 Acquired absence of bilateral breasts and nipples: Secondary | ICD-10-CM

## 2015-11-05 LAB — CBC WITH DIFFERENTIAL/PLATELET
BASOS ABS: 0 10*3/uL (ref 0.0–0.1)
Basophils Relative: 0 %
Eosinophils Absolute: 0.7 10*3/uL (ref 0.0–0.7)
Eosinophils Relative: 11 %
HCT: 36.9 % (ref 36.0–46.0)
HEMOGLOBIN: 11.9 g/dL — AB (ref 12.0–15.0)
LYMPHS PCT: 39 %
Lymphs Abs: 2.4 10*3/uL (ref 0.7–4.0)
MCH: 26.9 pg (ref 26.0–34.0)
MCHC: 32.2 g/dL (ref 30.0–36.0)
MCV: 83.5 fL (ref 78.0–100.0)
Monocytes Absolute: 0.4 10*3/uL (ref 0.1–1.0)
Monocytes Relative: 6 %
NEUTROS ABS: 2.7 10*3/uL (ref 1.7–7.7)
NEUTROS PCT: 44 %
PLATELETS: 234 10*3/uL (ref 150–400)
RBC: 4.42 MIL/uL (ref 3.87–5.11)
RDW: 15.5 % (ref 11.5–15.5)
WBC: 6.2 10*3/uL (ref 4.0–10.5)

## 2015-11-05 LAB — BASIC METABOLIC PANEL
ANION GAP: 5 (ref 5–15)
BUN: 7 mg/dL (ref 6–20)
CHLORIDE: 104 mmol/L (ref 101–111)
CO2: 30 mmol/L (ref 22–32)
Calcium: 9.2 mg/dL (ref 8.9–10.3)
Creatinine, Ser: 0.87 mg/dL (ref 0.44–1.00)
GFR calc non Af Amer: >60 mL/min (ref >60)
Glucose, Bld: 151 mg/dL — ABNORMAL HIGH (ref 65–99)
POTASSIUM: 3.6 mmol/L (ref 3.5–5.1)
Sodium: 139 mmol/L (ref 135–145)

## 2015-11-05 MED ORDER — ACETAMINOPHEN 325 MG PO TABS: 650.0000 mg | ORAL_TABLET | ORAL | Status: DC | PRN

## 2015-11-05 MED ORDER — FERROUS SULFATE 325 (65 FE) MG PO TABS: ORAL_TABLET | ORAL | Status: DC

## 2015-11-05 NOTE — Progress Notes (Signed)
Internal Medicine Clinic Attending  I saw and evaluated the patient.  I personally confirmed the key portions of the history and exam documented by Dr. Flores and I reviewed pertinent patient test results.  The assessment, diagnosis, and plan were formulated together and I agree with the documentation in the resident's note.  

## 2015-11-05 NOTE — Progress Notes (Signed)
Patient ID: [name], female   DOB: 1956/03/27, 59 y.o.   MRN: [id number] [facility] Subjective:   Patient ID: [name] female   DOB: 30-Dec-1955 59 y.o.   MRN: [id number]  HPI: [name] is a 59 y.o. female with a PMH detailed below who presents for follow up after she had a breast reduction surgery on November 19th.  Since her surgery, she has had dyspnea on exertion. She has been taking 800 mg ibuprofen twice daily , and has noted some darker stools than normal. She denies any chest pain, or nausea on exertion. She denies any pain in one of her legs, or worsening edema. She also denies any edema in her bilateral legs , or orthopnea.   She also denies any wheezing, and has never smoked.  She had PFTs done last year with a normal FEV1 over FVC ratio. She also had a sleep study done in 2011 ; at that time, they recommended a split-night study. She has very poor sleep hygiene, and we talked about that. She thinks she is simply deconditioned from her surgery, but her dyspnea on exertion has persisted over the last 3 weeks.  Please see the assessment and plan for the status of the patient's chronic medical problems.  Past Medical History  Diagnosis Date  . Seasonal allergies   . Asthma     History of  . Hypertension   . Iron deficiency 12-07-2011  . Anemia   . GERD (gastroesophageal reflux disease)   . Heart murmur   . Osteoporosis   . PONV (postoperative nausea and vomiting)   . Dysrhythmia     occ palpitations   . Sleep apnea     no CPAP machine; sleep study 02/2010 REM AHI 61.7/hr, total sleep REM 14.8/hr  . Headache(784.0)     occasional   . Osteoarthritis   . Morbid obesity ([place])   . Acute urinary retention s/p Foley 01/07/2012  . Hemorrhoids, internal, with bleeding & prolapse 12/13/2011  . Myocardial infarction [facility])     unsure when; per cardiologist report  . Chronic headaches   . Coronary artery disease   . Anginal pain ([place])   .  Dyslipidemia   . S/P CABG (coronary artery bypass graft)     x5 - LIMA to LAD, SVG to diagonal, SVG to OM1, SVG to OM2, SVG to PDA (Dr. Prescott Gum)  . Postmenopausal bleeding   . Neuropathy ([place])   . Blood dyscrasia     ABNORMAL VAGINAL BLEEDING  . Uterine cancer ([place]) 7/15    clinical stage IA grade 1 endometrioid endometrial cancer  . History of kidney stones   . Cyst of eye     "behind eye"   Current Outpatient Prescriptions  Medication Sig Dispense Refill  . albuterol (PROVENTIL HFA;VENTOLIN HFA) 108 (90 BASE) MCG/ACT inhaler Inhale 2 puffs into the lungs every 6 (six) hours as needed for wheezing or shortness of breath. 1 Inhaler 2  . aspirin EC 81 MG tablet Take 81 mg by mouth daily.    . CRESTOR 20 MG tablet TAKE 1 TABLET BY MOUTH EVERY DAY 30 tablet 4  . CVS SENNA PLUS 8.6-50 MG per tablet Take 1 tablet by mouth at bedtime as needed (constipation).   5  . cycloSPORINE (RESTASIS) 0.05 % ophthalmic emulsion Place 1 drop into both eyes 2 (two) times daily. 0.4 mL 2  . diclofenac sodium (VOLTAREN) 1 % GEL Apply 2 g topically 2 (  two) times daily as needed (joint pain).    . ferrous sulfate 325 (65 FE) MG tablet Take 1 tablet (325 mg total) by mouth 3 (three) times daily with meals. 90 tablet 3  . FLUOCINOLONE ACETONIDE OT Place 1 drop in ear(s) daily as needed.    . fluticasone (FLONASE) 50 MCG/ACT nasal spray Place 1 spray into both nostrils daily as needed for allergies or rhinitis.    Marland Kitchen ibuprofen (ADVIL,MOTRIN) 800 MG tablet Take 1 tablet (800 mg total) by mouth 2 (two) times daily as needed for mild pain or moderate pain. Take with food 60 tablet 0  . ipratropium (ATROVENT) 0.06 % nasal spray Place 2 sprays into both nostrils 4 (four) times daily. 15 mL 1  . irbesartan (AVAPRO) 150 MG tablet Take 1 tablet (150 mg total) by mouth daily. 30 tablet 11  . metoprolol tartrate (LOPRESSOR) 25 MG tablet TAKE 1 TABLET BY MOUTH TWICE A DAY 60 tablet 4  . omeprazole (PRILOSEC) 40 MG capsule  Take 40 mg by mouth daily.    Marland Kitchen oxyCODONE (OXY IR/ROXICODONE) 5 MG immediate release tablet Take 1-2 tablets (5-10 mg total) by mouth every 4 (four) hours as needed for moderate pain. 50 tablet 0   No current facility-administered medications for this visit.   Family History  Problem Relation Age of Onset  . Colon cancer Neg Hx   . Hypertension Sister   . Heart failure Sister   . Heart disease Sister   . Diabetes type II Sister   . Kidney disease Brother     hypertension  . Heart attack Mother 17  . Heart disease Mother   . Hypertension Mother   . Diabetes Mother   . Hypertension Child    Social History   Social History  . Marital Status: Single    Spouse Name: N/A  . Number of Children: 2  . Years of Education: N/A   Occupational History  . Retired    Social History Main Topics  . Smoking status: Never Smoker   . Smokeless tobacco: Never Used  . Alcohol Use: No  . Drug Use: No  . Sexual Activity: Not Currently   Other Topics Concern  . Not on file   Social History Narrative   Lives alone in a one story home.  Has 2 children.     Retired Regulatory affairs officer.     Education: 2 years of college.   Review of Systems  Constitutional: Negative for fever, chills and weight loss.  HENT: Negative for congestion.   Respiratory: Positive for shortness of breath. Negative for cough and wheezing.   Cardiovascular: Negative for chest pain, palpitations, orthopnea and leg swelling.  Gastrointestinal: Positive for abdominal pain, constipation, blood in stool and melena. Negative for heartburn.  Genitourinary: Negative for dysuria and urgency.  Musculoskeletal: Negative for myalgias.  Skin: Negative for itching and rash.  Neurological: Positive for dizziness. Negative for focal weakness, loss of consciousness and headaches.   Objective:  Physical Exam: Filed Vitals:   11/05/15 0959  BP: 146/67  Pulse: 87  Temp: 98 F (36.7 C)  TempSrc: Oral  Weight: 335 lb 14.4 oz (152.363 kg)   SpO2: 99%   General: resting in bed comfortably, appropriately conversational HEENT: no scleral icterus, extra-ocular muscles intact, oropharynx without lesions, slightly pale conjuctiva Cardiac: 2/6 systolic murmur loudest in the right-upper sternal border, no rubs, murmurs or gallops. Large sternal scar with slight depression Pulm: breathing well, clear to auscultation bilaterally Abd: bowel sounds normal,  soft, nondistended, non-tender Ext: warm and well perfused, without pedal edema Lymph: no cervical or supraclavicular lymphadenopathy Skin: no rash, hair, or nail changes Neuro: alert and oriented X3, cranial nerves II-XII grossly intact, moving all extremities well  Assessment & Plan:  Case discussed with Dr. Lynnae January  Dyspnea on exertion I think her dyspnea on exertion is most likely symptomatic anemia from gastrointestinal blood loss after taking high-dose ibuprofen following her breast reduction surgery.  We checked a CBC and ferritin today; I'll call her with the results.  I recommended she restart her iron; specifically taking iron 3 times a day on Mondays, Wednesdays, and Fridays, as a recent study showed this dosing schedule may be more effective in treating iron deficiency anemia and has had significant constipation in the past with daily dosing.  Although she is 1 month out from her breast reduction surgery, I doubt this is a pulmonary embolus that she does not have unilateral leg swelling,  Hemoptysis, or pleuritic chest pain, and is saturating 100% on room air, and is not tachycardic.  Although she has an extensive cardiac history with a 5 vessel CABG procedure done last year, I doubt this is acute coronary syndrome, as she denies any angina and an EKG in-office was unchanged from her her EKG last month.  Aortic stenosis is another consideration as she does have a systolic ejection murmur; she had a transesophageal echocardiogram last year that did not show any significant aortic  stenosis, but repeating an echocardiogram may be important. She has follow-up with cardiology in January. She had a sleep study in 2011 that was equivocal because she woke up so often. They recommended a split night study at that time. She has very poor sleep hygiene, so we discussed how to improve her sleep hygiene. We can re-consider repeating a sleep study at her next visit.  H/O bilateral breast reduction surgery  Her bilateral breast reduction surgery on November 19 went very well, without any complications. She says she feels much better, and her wounds are healing very well. She has a follow-up appointment with plastic surgery tomorrow.   Medications Ordered Meds ordered this encounter  Medications  . acetaminophen (TYLENOL) 325 MG tablet    Sig: Take 2 tablets (650 mg total) by mouth every 4 (four) hours as needed.    Dispense:  100 tablet    Refill:  2  . ferrous sulfate 325 (65 FE) MG tablet    Sig: Take 3 times a day only on Mondays, Wednesdays, and Fridays    Dispense:  90 tablet    Refill:  3   Other Orders Orders Placed This Encounter  Procedures  . CBC with Diff  . Ferritin  . BMP w Anion Gap (STAT/Sunquest-performed on-site)  . EKG 12-Lead  . EKG 12-Lead    Ordered by [employer]    Follow Up: Return in about 3 months (around 02/03/2016) for follow up with PCP.

## 2015-11-05 NOTE — Assessment & Plan Note (Signed)
I think her dyspnea on exertion is most likely symptomatic anemia from gastrointestinal blood loss after taking high-dose ibuprofen following her breast reduction surgery.  We checked a CBC and ferritin today; I'll call her with the results.  I recommended she restart her iron; specifically taking iron 3 times a day on Mondays, Wednesdays, and Fridays, as a recent study showed this dosing schedule may be more effective in treating iron deficiency anemia and has had significant constipation in the past with daily dosing.  Although she is 1 month out from her breast reduction surgery, I doubt this is a pulmonary embolus that she does not have unilateral leg swelling,  Hemoptysis, or pleuritic chest pain, and is saturating 100% on room air, and is not tachycardic.  Although she has an extensive cardiac history with a 5 vessel CABG procedure done last year, I doubt this is acute coronary syndrome, as she denies any angina and an EKG in-office was unchanged from her her EKG last month.  Aortic stenosis is another consideration as she does have a systolic ejection murmur; she had a transesophageal echocardiogram last year that did not show any significant aortic stenosis, but repeating an echocardiogram may be important. She has follow-up with cardiology in January. She had a sleep study in 2011 that was equivocal because she woke up so often. They recommended a split night study at that time. She has very poor sleep hygiene, so we discussed how to improve her sleep hygiene. We can re-consider repeating a sleep study at her next visit.

## 2015-11-05 NOTE — Patient Instructions (Addendum)
[name],  It was a pleasure meeting you today.   I'm glad that your breast reduction surgery went so well. Her shortness of breath may be from  Blood loss from your GI tract. Sometimes, taking ibuprofen can cause this,  So I recommended stopping the ibuprofen in starting Tylenol, which I sent her pharmacy. We've checked a blood level today. I strongly recommend taking your iron again to help get your blood numbers up. You can take this 3 times a day on Mondays, Wednesdays , and Fridays.  Also, I've attached some tips to help you sleep below. If these don't help, we can reconsider getting another sleep study to see if you need a CPAP machine.  Take care, and have a Merry Christmas, Dr. Melburn Hake  Tips to help you sleep Maintain a regular sleep routine . Go to bed at the same time. Wake up at the same time, even on weekends or holidays.  Have a comfortable pre-bedtime routine . A warm bath, shower . Meditation, or quiet time  Avoid naps  . When we take naps, it decreases the amount of sleep that we need the next night - which may cause difficulty sleeping.  Don't go to bed unless you are sleepy. Don't stay in bed awake for more than 10 - 20 minutes. . If you find your mind racing or worrying about not being able to sleep, get out of bed and sit in a chair in the dark. Do your mind racing in the chair until you are sleepy, then return to bed.  Marland Kitchen No TV or internet during these periods! That will just keep you awake.  Don't watch TV or read in bed. . When you watch TV or read in bed, you associate the bed with being awake. . Use your bed only for sleep and sex.   Don't eat a large meal before bedtime.  . If you are hungry at night, eat a light, healthy snack.   Do not drink caffeine inappropriately . The effects of caffeine may last for several hours. Caffeine can cause difficulty sleeping. If you drink caffeine, use it only before noon. . Remember that soda and tea contain caffeine  as well.  Avoid inappropriate substances that interfere with sleep . Cigarettes, alcohol, and over-the-counter medications may cause difficulty sleeping.  Exercise regularly . Exercise before 2 pm every day. Exercise promotes good sleep. Marland Kitchen Avoid rigorous exercise before bedtime as it can cause trouble sleeping.  Have a quiet, comfortable bedroom . Set your bedroom thermostat at a comfortable temperature. Generally, a little cooler is better than a little warmer. . Turn off the TV and other noise that may disrupt sleep. Background 'white noise' like a fan is OK. . If your pets awaken you, keep them outside the bedroom. . Your bedroom should be dark. Turn off bright lights. . If you are a 'clock watcher' at night, hide the clock. Marland Kitchen

## 2015-11-05 NOTE — Assessment & Plan Note (Signed)
Her bilateral breast reduction surgery on November 19 went very well, without any complications. She says she feels much better, and her wounds are healing very well. She has a follow-up appointment with plastic surgery tomorrow.

## 2015-11-06 LAB — FERRITIN: Ferritin: 20 ng/mL (ref 15–150)

## 2015-12-16 ENCOUNTER — Ambulatory Visit (INDEPENDENT_AMBULATORY_CARE_PROVIDER_SITE_OTHER): Payer: Medicare Other | Admitting: Internal Medicine

## 2015-12-16 ENCOUNTER — Encounter: Payer: Self-pay | Admitting: Internal Medicine

## 2015-12-16 VITALS — BP 142/86 | HR 78 | Ht 68.0 in | Wt 332.2 lb

## 2015-12-16 DIAGNOSIS — Z9013 Acquired absence of bilateral breasts and nipples: Secondary | ICD-10-CM

## 2015-12-16 DIAGNOSIS — Z951 Presence of aortocoronary bypass graft: Secondary | ICD-10-CM | POA: Diagnosis not present

## 2015-12-16 DIAGNOSIS — Z9889 Other specified postprocedural states: Secondary | ICD-10-CM

## 2015-12-16 DIAGNOSIS — E785 Hyperlipidemia, unspecified: Secondary | ICD-10-CM

## 2015-12-16 DIAGNOSIS — I10 Essential (primary) hypertension: Secondary | ICD-10-CM | POA: Diagnosis not present

## 2015-12-16 MED ORDER — METOPROLOL TARTRATE 25 MG PO TABS: 37.5000 mg | ORAL_TABLET | Freq: Two times a day (BID) | ORAL | Status: DC

## 2015-12-16 NOTE — Patient Instructions (Signed)
Your physician has recommended you make the following change in your medication:   1.) the lopressor has been changed to 37.5 mg twice a day ( 1& 1/2 tablet).  A new prescription has been sent to your pharmacy to reflect this change.  Your physician wants you to follow-up in: 6 months or sooner if needed. You will receive a reminder letter in the mail two months in advance. If you don't receive a letter, please call our office to schedule the follow-up appointment.  If you need a refill on your cardiac medications before your next appointment, please call your pharmacy.  Dr Debara Pickett recommends that you try to decrease your weight. I have enclosed some information for you about calorie counting.  Calorie Counting for Weight Loss Calories are energy you get from the things you eat and drink. Your body uses this energy to keep you going throughout the day. The number of calories you eat affects your weight. When you eat more calories than your body needs, your body stores the extra calories as fat. When you eat fewer calories than your body needs, your body burns fat to get the energy it needs. Calorie counting means keeping track of how many calories you eat and drink each day. If you make sure to eat fewer calories than your body needs, you should lose weight. In order for calorie counting to work, you will need to eat the number of calories that are right for you in a day to lose a healthy amount of weight per week. A healthy amount of weight to lose per week is usually 1-2 lb (0.5-0.9 kg). A dietitian can determine how many calories you need in a day and give you suggestions on how to reach your calorie goal.  WHAT IS MY MY PLAN? My goal is to have __________ calories per day.  If I have this many calories per day, I should lose around __________ pounds per week. WHAT DO I NEED TO KNOW ABOUT CALORIE COUNTING? In order to meet your daily calorie goal, you will need to:  Find out how many calories are  in each food you would like to eat. Try to do this before you eat.  Decide how much of the food you can eat.  Write down what you ate and how many calories it had. Doing this is called keeping a food log. WHERE DO I FIND CALORIE INFORMATION? The number of calories in a food can be found on a Nutrition Facts label. Note that all the information on a label is based on a specific serving of the food. If a food does not have a Nutrition Facts label, try to look up the calories online or ask your dietitian for help. HOW DO I DECIDE HOW MUCH TO EAT? To decide how much of the food you can eat, you will need to consider both the number of calories in one serving and the size of one serving. This information can be found on the Nutrition Facts label. If a food does not have a Nutrition Facts label, look up the information online or ask your dietitian for help. Remember that calories are listed per serving. If you choose to have more than one serving of a food, you will have to multiply the calories per serving by the amount of servings you plan to eat. For example, the label on a package of bread might say that a serving size is 1 slice and that there are 90 calories in a  serving. If you eat 1 slice, you will have eaten 90 calories. If you eat 2 slices, you will have eaten 180 calories. HOW DO I KEEP A FOOD LOG? After each meal, record the following information in your food log:  What you ate.  How much of it you ate.  How many calories it had.  Then, add up your calories. Keep your food log near you, such as in a small notebook in your pocket. Another option is to use a mobile app or website. Some programs will calculate calories for you and show you how many calories you have left each time you add an item to the log. WHAT ARE SOME CALORIE COUNTING TIPS?  Use your calories on foods and drinks that will fill you up and not leave you hungry. Some examples of this include foods like nuts and nut  butters, vegetables, lean proteins, and high-fiber foods (more than 5 g fiber per serving).  Eat nutritious foods and avoid empty calories. Empty calories are calories you get from foods or beverages that do not have many nutrients, such as candy and soda. It is better to have a nutritious high-calorie food (such as an avocado) than a food with few nutrients (such as a bag of chips).  Know how many calories are in the foods you eat most often. This way, you do not have to look up how many calories they have each time you eat them.  Look out for foods that may seem like low-calorie foods but are really high-calorie foods, such as baked goods, soda, and fat-free candy.  Pay attention to calories in drinks. Drinks such as sodas, specialty coffee drinks, alcohol, and juices have a lot of calories yet do not fill you up. Choose low-calorie drinks like water and diet drinks.  Focus your calorie counting efforts on higher calorie items. Logging the calories in a garden salad that contains only vegetables is less important than calculating the calories in a milk shake.  Find a way of tracking calories that works for you. Get creative. Most people who are successful find ways to keep track of how much they eat in a day, even if they do not count every calorie. WHAT ARE SOME PORTION CONTROL TIPS?  Know how many calories are in a serving. This will help you know how many servings of a certain food you can have.  Use a measuring cup to measure serving sizes. This is helpful when you start out. With time, you will be able to estimate serving sizes for some foods.  Take some time to put servings of different foods on your favorite plates, bowls, and cups so you know what a serving looks like.  Try not to eat straight from a bag or box. Doing this can lead to overeating. Put the amount you would like to eat in a cup or on a plate to make sure you are eating the right portion.  Use smaller plates, glasses,  and bowls to prevent overeating. This is a quick and easy way to practice portion control. If your plate is smaller, less food can fit on it.  Try not to multitask while eating, such as watching TV or using your computer. If it is time to eat, sit down at a table and enjoy your food. Doing this will help you to start recognizing when you are full. It will also make you more aware of what and how much you are eating. HOW CAN I CALORIE COUNT  WHEN EATING OUT?  Ask for smaller portion sizes or child-sized portions.  Consider sharing an entree and sides instead of getting your own entree.  If you get your own entree, eat only half. Ask for a box at the beginning of your meal and put the rest of your entree in it so you are not tempted to eat it.  Look for the calories on the menu. If calories are listed, choose the lower calorie options.  Choose dishes that include vegetables, fruits, whole grains, low-fat dairy products, and lean protein. Focusing on smart food choices from each of the 5 food groups can help you stay on track at restaurants.  Choose items that are boiled, broiled, grilled, or steamed.  Choose water, milk, unsweetened iced tea, or other drinks without added sugars. If you want an alcoholic beverage, choose a lower calorie option. For example, a regular margarita can have up to 700 calories and a glass of wine has around 150.  Stay away from items that are buttered, battered, fried, or served with cream sauce. Items labeled "crispy" are usually fried, unless stated otherwise.  Ask for dressings, sauces, and syrups on the side. These are usually very high in calories, so do not eat much of them.  Watch out for salads. Many people think salads are a healthy option, but this is often not the case. Many salads come with bacon, fried chicken, lots of cheese, fried chips, and dressing. All of these items have a lot of calories. If you want a salad, choose a garden salad and ask for  grilled meats or steak. Ask for the dressing on the side, or ask for olive oil and vinegar or lemon to use as dressing.  Estimate how many servings of a food you are given. For example, a serving of cooked rice is  cup or about the size of half a tennis ball or one cupcake wrapper. Knowing serving sizes will help you be aware of how much food you are eating at restaurants. The list below tells you how big or small some common portion sizes are based on everyday objects.  1 oz--4 stacked dice.  3 oz--1 deck of cards.  1 tsp--1 dice.  1 Tbsp-- a Ping-Pong ball.  2 Tbsp--1 Ping-Pong ball.   cup--1 tennis ball or 1 cupcake wrapper.  1 cup--1 baseball.   This information is not intended to replace advice given to you by your health care provider. Make sure you discuss any questions you have with your health care provider.   Document Released: 11/08/2005 Document Revised: 11/29/2014 Document Reviewed: 09/13/2013 Elsevier Interactive Patient Education [employer].

## 2015-12-16 NOTE — Progress Notes (Signed)
OFFICE NOTE  Chief Complaint:  Hypertension follow-up  Primary Care Physician: Axel Filler, MD  HPI:  [name] is a 60 year old African American obese female presented to 01/01/14 with symptoms of unstable angina. I had seen her once in 2012, but not since then.  She recently had been having chest pain and presented urgently when she became short of breath at home and had chest tightness, which started as right arm pain and right chest pain, then moved substernally. She ultimately underwent cardiac catheterization via right radial artery which demonstrated severe three-vessel CAD with graftable target vessels. Echocardiogram showed good LV function with some left ventricular hypertrophy from her hypertension and there does not appear to be significant valve disease. Carotid dopplers showed a 1-39% bilateral carotid artery stenosis. She underwent a CABG x 5 on 01/09/2014. Left internal mammary artery to LAD, saphenous vein graft to diagonal, sequential saphenous vein graft to OM1 and OM2, saphenous vein graft to posterior descending. She had multiple teeth extractions prior to surgery. She did well post op. She was anemic but did not need transfusion. At discharge she went to SNF for rehab-she did not stay the recommended time frame due to BR not being available when she needed it. She was not given discharge medications when she left SNF. She saw Cecilie Kicks, FNP, in followup for her hospitalization and was restarted on some of her medications.   [name] was seen today in the office for follow-up. At her last office visit I cleared her for surgery and she underwent hysterectomy and has had resolution of her bleeding problems. She is now contemplating right knee surgery and also breast reduction surgery. She's in the interim seen in the hospital for palpitations. She reports those have improved with medication adjustment. She's decreased her Lasix now to every other day and I think  she can probably take it as needed. She denies any chest pain worsening shortness of breath. She is undergoing injections to reduce the size of her keloid scar.  I saw [name] back in the office today. She's been undergoing rehabilitation and really wants to continue it. She's managed to lose some weight and feels better. Recently though she's been having increased blood pressures. Does have ranged up to [id number] systolic. She had a small increase in her lisinopril to 10 mg daily with an improvement however this staff does not want to continue her at rehabilitation until her blood pressure is better controlled. She denies any chest pain or palpitations.  [name] returns today for follow-up. She continues in cardiac rehabilitation and is doing well. At his last office visit I increased her lisinopril to 20 mg daily and her blood pressure is now much better controlled. Overall she is feeling well. She is undergoing cesarean injections of the keloid scar that she has in her bypass site. She is concerned about a dilated varicose vein that she's developed at the inferior margin.  Since I last saw her, she is doing well. She underwent breast reductions surgery, which has helped her CABG scar some, but has failed to lose significant weight. BMI is now over 50. She does get short of breath but not worsening. Blood pressure is well-controlled and cough has gone away after switching from ACE-I to ARB. She reports slightly more palpitations recently. She has episodes of palpitations, worse at night that occur 3-4x a month.  PMHx:  Past Medical History  Diagnosis Date  . Seasonal allergies   . Asthma  History of  . Hypertension   . Iron deficiency 12-07-2011  . Anemia   . GERD (gastroesophageal reflux disease)   . Heart murmur   . Osteoporosis   . PONV (postoperative nausea and vomiting)   . Dysrhythmia     occ palpitations   . Sleep apnea     no CPAP machine; sleep study 02/2010 REM AHI 61.7/hr,  total sleep REM 14.8/hr  . Headache(784.0)     occasional   . Osteoarthritis   . Morbid obesity ([place])   . Acute urinary retention s/p Foley 01/07/2012  . Hemorrhoids, internal, with bleeding & prolapse 12/13/2011  . Myocardial infarction [facility])     unsure when; per cardiologist report  . Chronic headaches   . Coronary artery disease   . Anginal pain ([place])   . Dyslipidemia   . S/P CABG (coronary artery bypass graft)     x5 - LIMA to LAD, SVG to diagonal, SVG to OM1, SVG to OM2, SVG to PDA (Dr. Prescott Gum)  . Postmenopausal bleeding   . Neuropathy ([place])   . Blood dyscrasia     ABNORMAL VAGINAL BLEEDING  . Uterine cancer ([place]) 7/15    clinical stage IA grade 1 endometrioid endometrial cancer  . History of kidney stones   . Cyst of eye     "behind eye"    Past Surgical History  Procedure Laterality Date  . Knee surgery Bilateral 1999  . Total knee arthroplasty Left 04/29/2011  . Multiple tooth extractions    . Wisdom tooth extraction    . Colonoscopy  2009  . Knee arthroscopy  1991  . Transanal hemorrhoidal dearterliaization  01/06/12    with external hemorrhoid removal  . Coronary artery bypass graft N/A 01/09/2014    Procedure: CORONARY ARTERY BYPASS GRAFTING (CABG) x 5 using left internal mammary artery and right leg greater saphenous vein harvested endoscopically;  Surgeon: Ivin Poot, MD;  Location: [place];  Service: Open Heart Surgery;  Laterality: N/A;  please use bed extenders and breast binder  . Intraoperative transesophageal echocardiogram N/A 01/09/2014    Procedure: INTRAOPERATIVE TRANSESOPHAGEAL ECHOCARDIOGRAM;  Surgeon: Ivin Poot, MD;  Location: [place];  Service: Open Heart Surgery;  Laterality: N/A;  . Tee without cardioversion  01/2010    EF 60-65%, small, flat, non-infiltrating, calcified, fixed apical/septal mass  . Nm myocar perf wall motion  01/2010    dipyridamole myoview - moderate perfusion defect in basal inferoseptal, basal inferior, mid inferoseptal,  mid inferior, apical inferior region; EF 56%  . Cardiac catheterization    . Multiple extractions with alveoloplasty N/A 04/11/2014    Procedure: Extraction of tooth #'s 1,2,8,16 with alveoloplasty, maxillary tuberosity reductions, and gross debridement of remaining teeth.;  Surgeon: Lenn Cal, DDS;  Location: [place];  Service: Oral Surgery;  Laterality: N/A;  . Vaginal hysterectomy N/A 06/03/2014    Procedure: HYSTERECTOMY VAGINAL;  Surgeon: Lavonia Drafts, MD;  Location: [facility];  Service: Gynecology;  Laterality: N/A;  . Unilateral salpingectomy Right 06/03/2014    Procedure: UNILATERAL SALPINGECTOMY;  Surgeon: Lavonia Drafts, MD;  Location: [facility];  Service: Gynecology;  Laterality: Right;  . Left heart catheterization with coronary angiogram N/A 01/04/2014    Procedure: LEFT HEART CATHETERIZATION WITH CORONARY ANGIOGRAM;  Surgeon: Sinclair Grooms, MD;  Location: [facility] CATH LAB;  Service: Cardiovascular;  Laterality: N/A;  . Abdominal hysterectomy    . Breast reduction surgery Bilateral 10/10/2015    Procedure: BILATERAL MAMMARY REDUCTION  (BREAST) WITH  FREE NIPPLE GRAFT;  Surgeon: Irene Limbo, MD;  Location: [place];  Service: Plastics;  Laterality: Bilateral;    FAMHx:  Family History  Problem Relation Age of Onset  . Colon cancer Neg Hx   . Hypertension Sister   . Heart failure Sister   . Heart disease Sister   . Diabetes type II Sister   . Kidney disease Brother     hypertension  . Heart attack Mother 14  . Heart disease Mother   . Hypertension Mother   . Diabetes Mother   . Hypertension Child     SOCHx:   reports that she has never smoked. She has never used smokeless tobacco. She reports that she does not drink alcohol or use illicit drugs.  ALLERGIES:  Allergies  Allergen Reactions  . Cephalosporins Anaphylaxis    Tongue swelling, gum pain  . Eicosapentaenoic Acid (Epa) Shortness Of Breath  . Fish-Derived Products Shortness Of Breath  . Peanuts  [Peanut Oil] Shortness Of Breath    Peanut butter-   . Codeine Other (See Comments)    hallucinations  . Latex Itching  . Metronidazole Other (See Comments)    Palpitations, mild SOB, metallic taste, dry mouth, high blood pressure  . Ciprofloxacin   . Lisinopril Cough  . Naproxen Nausea Only    Head ahce  . Penicillins Other (See Comments)    Childhood allergy but pt thinks she is no longer allergic  . Prednisone Other (See Comments)    Heart beating fast       ROS: A comprehensive review of systems was negative except for: Integument/breast: positive for Keloid scar with varicose vein  HOME MEDS: Current Outpatient Prescriptions  Medication Sig Dispense Refill  . acetaminophen (TYLENOL) 325 MG tablet Take 2 tablets (650 mg total) by mouth every 4 (four) hours as needed. 100 tablet 2  . albuterol (PROVENTIL HFA;VENTOLIN HFA) 108 (90 BASE) MCG/ACT inhaler Inhale 2 puffs into the lungs every 6 (six) hours as needed for wheezing or shortness of breath. 1 Inhaler 2  . aspirin EC 81 MG tablet Take 81 mg by mouth daily.    . CRESTOR 20 MG tablet TAKE 1 TABLET BY MOUTH EVERY DAY 30 tablet 4  . [employer] SENNA PLUS 8.6-50 MG per tablet Take 1 tablet by mouth at bedtime as needed (constipation).   5  . cycloSPORINE (RESTASIS) 0.05 % ophthalmic emulsion Place 1 drop into both eyes 2 (two) times daily. 0.4 mL 2  . diclofenac sodium (VOLTAREN) 1 % GEL Apply 2 g topically 2 (two) times daily as needed (joint pain).    . ferrous sulfate 325 (65 FE) MG tablet Take 3 times a day only on Mondays, Wednesdays, and Fridays 90 tablet 3  . FLUOCINOLONE ACETONIDE OT Place 1 drop in ear(s) daily as needed.    . fluticasone (FLONASE) 50 MCG/ACT nasal spray Place 1 spray into both nostrils daily as needed for allergies or rhinitis.    Marland Kitchen ipratropium (ATROVENT) 0.06 % nasal spray Place 2 sprays into both nostrils 4 (four) times daily. 15 mL 1  . irbesartan (AVAPRO) 150 MG tablet Take 1 tablet (150 mg total) by  mouth daily. 30 tablet 11  . metoprolol tartrate (LOPRESSOR) 25 MG tablet Take 1.5 tablets (37.5 mg total) by mouth 2 (two) times daily. 90 tablet 4  . omeprazole (PRILOSEC) 40 MG capsule Take 40 mg by mouth daily.     No current facility-administered medications for this visit.    LABS/IMAGING: No  results found for this or any previous visit (from the past 47 hour(s)). No results found.  VITALS: BP 142/86 mmHg  Pulse 78  Ht 5\' 8"  (1.727 m)  Wt 332 lb 3 oz (150.679 kg)  BMI 50.52 kg/m2  LMP 01/13/2014  EXAM: General appearance: alert and no distress Neck: no carotid bruit and no JVD Lungs: clear to auscultation bilaterally Heart: regular rate and rhythm, S1, S2 normal, no murmur, click, rub or gallop Abdomen: soft, non-tender; bowel sounds normal; no masses,  no organomegaly and morbidly obese Extremities: extremities normal, atraumatic, no cyanosis or edema Pulses: 2+ and symmetric Skin: Keloid scar of the midsternal incision site with a dilated varicose vein at the distal aspect Neurologic: Grossly normal  EKG: Deferred  ASSESSMENT: 1. Coronary artery disease status post 5 vessel CABG (2015) 2. Morbid obesity 3. Hypertension - uncontrolled 4. Dyslipidemia 5. OSA, not on cPAP 6. Keloid scar - s/p breast reduction, possible upcoming surgery for scar revision 7. Palpitations  PLAN: 1.   Ms. Griffin has improved palpitations and good BP control. No anginal symptoms. She needs to get more exercise and activity as weight is climbing, despite 10 lbs taken off for breast reduction. Slight increase in frequency of palpitations. BP room for increase in meds. Increase metoprolol to 37.5 mg BID. Follow-up in 6 months.  [name], MD, [facility] Attending Cardiologist [place] 12/16/2015, 12:17 PM

## 2015-12-31 ENCOUNTER — Ambulatory Visit (INDEPENDENT_AMBULATORY_CARE_PROVIDER_SITE_OTHER): Payer: Medicare Other | Admitting: Internal Medicine

## 2015-12-31 ENCOUNTER — Encounter: Payer: Self-pay | Admitting: Internal Medicine

## 2015-12-31 VITALS — BP 149/65 | HR 69 | Temp 98.4°F | Ht 68.0 in | Wt 332.6 lb

## 2015-12-31 DIAGNOSIS — J069 Acute upper respiratory infection, unspecified: Secondary | ICD-10-CM | POA: Diagnosis not present

## 2015-12-31 MED ORDER — GUAIFENESIN-CODEINE 100-10 MG/5ML PO SOLN: 5.0000 mL | Freq: Four times a day (QID) | ORAL | Status: DC | PRN

## 2015-12-31 NOTE — Progress Notes (Signed)
[facility] Subjective:   Patient ID: [name] female   DOB: Jan 31, 1956 60 y.o.   MRN: [id number]  HPI: [name] is a 60 y.o. female with a PMH detailed below who presents for evaluation of a cold.  She reports that two weeks ago she developed diarrhea for 2 days followed by a cough, chest congestion, nasal congestion, headaches, and generalized malaise and fatigue.  She notes she has been coughing up mucus and blowing her nose frequently.  Nyquil helped some especially to sleep. Her nasal spray has helped some to relieve the congestion. She has not tried any other medications.  She has multiple friends who are also sick with similar issues. She denies any fever or chills.    Past Medical History  Diagnosis Date  . Seasonal allergies   . Asthma     History of  . Hypertension   . Iron deficiency 12-07-2011  . Anemia   . GERD (gastroesophageal reflux disease)   . Heart murmur   . Osteoporosis   . PONV (postoperative nausea and vomiting)   . Dysrhythmia     occ palpitations   . Sleep apnea     no CPAP machine; sleep study 02/2010 REM AHI 61.7/hr, total sleep REM 14.8/hr  . Headache(784.0)     occasional   . Osteoarthritis   . Morbid obesity ([place])   . Acute urinary retention s/p Foley 01/07/2012  . Hemorrhoids, internal, with bleeding & prolapse 12/13/2011  . Myocardial infarction [facility])     unsure when; per cardiologist report  . Chronic headaches   . Coronary artery disease   . Anginal pain ([place])   . Dyslipidemia   . S/P CABG (coronary artery bypass graft)     x5 - LIMA to LAD, SVG to diagonal, SVG to OM1, SVG to OM2, SVG to PDA (Dr. Prescott Gum)  . Postmenopausal bleeding   . Neuropathy ([place])   . Blood dyscrasia     ABNORMAL VAGINAL BLEEDING  . Uterine cancer ([place]) 7/15    clinical stage IA grade 1 endometrioid endometrial cancer  . History of kidney stones   . Cyst of eye     "behind eye"   Current Outpatient Prescriptions   Medication Sig Dispense Refill  . acetaminophen (TYLENOL) 325 MG tablet Take 2 tablets (650 mg total) by mouth every 4 (four) hours as needed. 100 tablet 2  . albuterol (PROVENTIL HFA;VENTOLIN HFA) 108 (90 BASE) MCG/ACT inhaler Inhale 2 puffs into the lungs every 6 (six) hours as needed for wheezing or shortness of breath. 1 Inhaler 2  . aspirin EC 81 MG tablet Take 81 mg by mouth daily.    . CRESTOR 20 MG tablet TAKE 1 TABLET BY MOUTH EVERY DAY 30 tablet 4  . [facility] SENNA PLUS 8.6-50 MG per tablet Take 1 tablet by mouth at bedtime as needed (constipation).   5  . cycloSPORINE (RESTASIS) 0.05 % ophthalmic emulsion Place 1 drop into both eyes 2 (two) times daily. 0.4 mL 2  . diclofenac sodium (VOLTAREN) 1 % GEL Apply 2 g topically 2 (two) times daily as needed (joint pain).    . ferrous sulfate 325 (65 FE) MG tablet Take 3 times a day only on Mondays, Wednesdays, and Fridays 90 tablet 3  . FLUOCINOLONE ACETONIDE OT Place 1 drop in ear(s) daily as needed.    . fluticasone (FLONASE) 50 MCG/ACT nasal spray Place 1 spray into both nostrils daily as needed for allergies or  rhinitis.    Marland Kitchen guaiFENesin-codeine 100-10 MG/5ML syrup Take 5 mLs by mouth every 6 (six) hours as needed for cough. 120 mL 0  . ipratropium (ATROVENT) 0.06 % nasal spray Place 2 sprays into both nostrils 4 (four) times daily. 15 mL 1  . irbesartan (AVAPRO) 150 MG tablet Take 1 tablet (150 mg total) by mouth daily. 30 tablet 11  . metoprolol tartrate (LOPRESSOR) 25 MG tablet Take 1.5 tablets (37.5 mg total) by mouth 2 (two) times daily. 90 tablet 4  . omeprazole (PRILOSEC) 40 MG capsule Take 40 mg by mouth daily.     No current facility-administered medications for this visit.   Family History  Problem Relation Age of Onset  . Colon cancer Neg Hx   . Hypertension Sister   . Heart failure Sister   . Heart disease Sister   . Diabetes type II Sister   . Kidney disease Brother     hypertension  . Heart attack Mother 1  . Heart  disease Mother   . Hypertension Mother   . Diabetes Mother   . Hypertension Child    Social History   Social History  . Marital Status: Single    Spouse Name: N/A  . Number of Children: 2  . Years of Education: N/A   Occupational History  . Retired    Social History Main Topics  . Smoking status: Never Smoker   . Smokeless tobacco: Never Used  . Alcohol Use: No  . Drug Use: No  . Sexual Activity: Not Currently   Other Topics Concern  . None   Social History Narrative   Lives alone in a one story home.  Has 2 children.     Retired Regulatory affairs officer.     Education: 2 years of college.   Review of Systems: Review of Systems  Constitutional: Positive for malaise/fatigue. Negative for fever and chills.  Respiratory: Positive for cough and sputum production. Negative for hemoptysis, shortness of breath and wheezing.   Cardiovascular: Negative for chest pain.  Gastrointestinal: Negative for abdominal pain, diarrhea, constipation and blood in stool.  Genitourinary: Negative for dysuria.  Musculoskeletal: Negative for myalgias.     Objective:  Physical Exam: Filed Vitals:   12/31/15 0949  BP: 149/65  Pulse: 69  Temp: 98.4 F (36.9 C)  TempSrc: Oral  Height: 5\' 8"  (1.727 m)  Weight: 332 lb 9.6 oz (150.866 kg)  SpO2: 100%  Physical Exam  Constitutional: She is well-developed, well-nourished, and in no distress.  Cardiovascular: Normal rate and regular rhythm.   Pulmonary/Chest: Effort normal. She has wheezes (end expiratory).  Nursing note and vitals reviewed.   Assessment & Plan:  Case discussed with Dr. Evette Doffing  Acute URI A: URI, Acute bronchitis  P: -Rx Guafinesin codeine cough syrup, I reviewed with the patient that she has a listed allergy to codeine that says "hallucinations," she reports that was due to a different medication and she has taken codeine based cough syrup in since 2010 without reaction and would like the prescription. - Albuterol PRN - Rest      Medications Ordered Meds ordered this encounter  Medications  . guaiFENesin-codeine 100-10 MG/5ML syrup    Sig: Take 5 mLs by mouth every 6 (six) hours as needed for cough.    Dispense:  120 mL    Refill:  0   Other Orders No orders of the defined types were placed in this encounter.   Follow Up: Return if symptoms worsen or fail to improve.

## 2015-12-31 NOTE — Assessment & Plan Note (Signed)
A: URI, Acute bronchitis  P: -Rx Guafinesin codeine cough syrup, I reviewed with the patient that she has a listed allergy to codeine that says "hallucinations," she reports that was due to a different medication and she has taken codeine based cough syrup in since 2010 without reaction and would like the prescription. - Albuterol PRN - Rest

## 2015-12-31 NOTE — Patient Instructions (Addendum)
General Instructions:   Please bring your medicines with you each time you come to clinic.  Medicines may include prescription medications, over-the-counter medications, herbal remedies, eye drops, vitamins, or other pills.   Progress Toward Treatment Goals:  Treatment Goal 12/13/2013  Blood pressure unchanged    Self Care Goals & Plans:  Self Care Goal 08/01/2015  Manage my medications take my medicines as prescribed; bring my medications to every visit; refill my medications on time  Monitor my health keep track of my blood pressure  Eat healthy foods eat more vegetables; eat foods that are low in salt; eat baked foods instead of fried foods  Be physically active find an activity I enjoy    No flowsheet data found.   Care Management & Community Referrals:  Referral 11/06/2013  Referrals made for care management support none needed       Upper Respiratory Infection, Adult Most upper respiratory infections (URIs) are a viral infection of the air passages leading to the lungs. A URI affects the nose, throat, and upper air passages. The most common type of URI is nasopharyngitis and is typically referred to as "the common cold." URIs run their course and usually go away on their own. Most of the time, a URI does not require medical attention, but sometimes a bacterial infection in the upper airways can follow a viral infection. This is called a secondary infection. Sinus and middle ear infections are common types of secondary upper respiratory infections. Bacterial pneumonia can also complicate a URI. A URI can worsen asthma and chronic obstructive pulmonary disease (COPD). Sometimes, these complications can require emergency medical care and may be life threatening.  CAUSES Almost all URIs are caused by viruses. A virus is a type of germ and can spread from one person to another.  RISKS FACTORS You may be at risk for a URI if:   You smoke.   You have chronic heart or lung  disease.  You have a weakened defense (immune) system.   You are very young or very old.   You have nasal allergies or asthma.  You work in crowded or poorly ventilated areas.  You work in health care facilities or schools. SIGNS AND SYMPTOMS  Symptoms typically develop 2-3 days after you come in contact with a cold virus. Most viral URIs last 7-10 days. However, viral URIs from the influenza virus (flu virus) can last 14-18 days and are typically more severe. Symptoms may include:   Runny or stuffy (congested) nose.   Sneezing.   Cough.   Sore throat.   Headache.   Fatigue.   Fever.   Loss of appetite.   Pain in your forehead, behind your eyes, and over your cheekbones (sinus pain).  Muscle aches.  DIAGNOSIS  Your health care provider may diagnose a URI by:  Physical exam.  Tests to check that your symptoms are not due to another condition such as:  Strep throat.  Sinusitis.  Pneumonia.  Asthma. TREATMENT  A URI goes away on its own with time. It cannot be cured with medicines, but medicines may be prescribed or recommended to relieve symptoms. Medicines may help:  Reduce your fever.  Reduce your cough.  Relieve nasal congestion. HOME CARE INSTRUCTIONS   Take medicines only as directed by your health care provider.   Gargle warm saltwater or take cough drops to comfort your throat as directed by your health care provider.  Use a warm mist humidifier or inhale steam from a shower  to increase air moisture. This may make it easier to breathe.  Drink enough fluid to keep your urine clear or pale yellow.   Eat soups and other clear broths and maintain good nutrition.   Rest as needed.   Return to work when your temperature has returned to normal or as your health care provider advises. You may need to stay home longer to avoid infecting others. You can also use a face mask and careful hand washing to prevent spread of the  virus.  Increase the usage of your inhaler if you have asthma.   Do not use any tobacco products, including cigarettes, chewing tobacco, or electronic cigarettes. If you need help quitting, ask your health care provider. PREVENTION  The best way to protect yourself from getting a cold is to practice good hygiene.   Avoid oral or hand contact with people with cold symptoms.   Wash your hands often if contact occurs.  There is no clear evidence that vitamin C, vitamin E, echinacea, or exercise reduces the chance of developing a cold. However, it is always recommended to get plenty of rest, exercise, and practice good nutrition.  SEEK MEDICAL CARE IF:   You are getting worse rather than better.   Your symptoms are not controlled by medicine.   You have chills.  You have worsening shortness of breath.  You have brown or red mucus.  You have yellow or brown nasal discharge.  You have pain in your face, especially when you bend forward.  You have a fever.  You have swollen neck glands.  You have pain while swallowing.  You have white areas in the back of your throat. SEEK IMMEDIATE MEDICAL CARE IF:   You have severe or persistent:  Headache.  Ear pain.  Sinus pain.  Chest pain.  You have chronic lung disease and any of the following:  Wheezing.  Prolonged cough.  Coughing up blood.  A change in your usual mucus.  You have a stiff neck.  You have changes in your:  Vision.  Hearing.  Thinking.  Mood. MAKE SURE YOU:   Understand these instructions.  Will watch your condition.  Will get help right away if you are not doing well or get worse.   This information is not intended to replace advice given to you by your health care provider. Make sure you discuss any questions you have with your health care provider.   Document Released: 05/04/2001 Document Revised: 03/25/2015 Document Reviewed: 02/13/2014 Elsevier Interactive Patient Education  [employer].

## 2015-12-31 NOTE — Addendum Note (Signed)
Addended by: Joni Reining C on: 12/31/2015 11:12 AM   Modules accepted: Level of Service

## 2016-01-01 NOTE — Progress Notes (Signed)
Internal Medicine Clinic Attending  Case discussed with Dr. Hoffman at the time of the visit.  We reviewed the resident's history and exam and pertinent patient test results.  I agree with the assessment, diagnosis, and plan of care documented in the resident's note.  

## 2016-01-02 ENCOUNTER — Other Ambulatory Visit: Payer: Self-pay | Admitting: Internal Medicine

## 2016-01-02 NOTE — Telephone Encounter (Signed)
Rx(s) sent to pharmacy electronically.  

## 2016-01-26 ENCOUNTER — Ambulatory Visit (INDEPENDENT_AMBULATORY_CARE_PROVIDER_SITE_OTHER): Payer: Medicare Other | Admitting: Student in an Organized Health Care Education/Training Program

## 2016-01-26 ENCOUNTER — Encounter: Payer: Self-pay | Admitting: Student in an Organized Health Care Education/Training Program

## 2016-01-26 VITALS — BP 152/64 | HR 64 | Temp 98.1°F | Ht 68.0 in | Wt 334.1 lb

## 2016-01-26 DIAGNOSIS — Z7982 Long term (current) use of aspirin: Secondary | ICD-10-CM

## 2016-01-26 DIAGNOSIS — Z79899 Other long term (current) drug therapy: Secondary | ICD-10-CM

## 2016-01-26 DIAGNOSIS — I700 Atherosclerosis of aorta: Secondary | ICD-10-CM

## 2016-01-26 DIAGNOSIS — M7662 Achilles tendinitis, left leg: Secondary | ICD-10-CM | POA: Diagnosis not present

## 2016-01-26 DIAGNOSIS — I10 Essential (primary) hypertension: Secondary | ICD-10-CM | POA: Diagnosis not present

## 2016-01-26 DIAGNOSIS — G4733 Obstructive sleep apnea (adult) (pediatric): Secondary | ICD-10-CM | POA: Diagnosis not present

## 2016-01-26 DIAGNOSIS — M7660 Achilles tendinitis, unspecified leg: Secondary | ICD-10-CM | POA: Insufficient documentation

## 2016-01-26 MED ORDER — CARBAMIDE PEROXIDE 6.5 % OT SOLN: 5.0000 [drp] | Freq: Two times a day (BID) | OTIC | Status: DC

## 2016-01-26 NOTE — Patient Instructions (Signed)
Achilles Tendinitis Achilles tendinitis is inflammation of the tough, cord-like band that attaches the lower muscles of your leg to your heel (Achilles tendon). It is usually caused by overusing the tendon and joint involved.  CAUSES Achilles tendinitis can happen because of:  A sudden increase in exercise or activity (such as running).  Doing the same exercises or activities (such as jumping) over and over.  Not warming up calf muscles before exercising.  Exercising in shoes that are worn out or not made for exercise.  Having arthritis or a bone growth on the back of the heel bone. This can rub against the tendon and hurt the tendon. SIGNS AND SYMPTOMS The most common symptoms are:  Pain in the back of the leg, just above the heel. The pain usually gets worse with exercise and better with rest.  Stiffness or soreness in the back of the leg, especially in the morning.  Swelling of the skin over the Achilles tendon.  Trouble standing on tiptoe. Sometimes, an Achilles tendon tears (ruptures). Symptoms of an Achilles tendon rupture can include:  Sudden, severe pain in the back of the leg.  Trouble putting weight on the foot or walking normally. DIAGNOSIS Achilles tendinitis will be diagnosed based on symptoms and a physical examination. An X-ray may be done to check if another condition is causing your symptoms. An MRI may be ordered if your health care provider suspects you may have completely torn your tendon, which is called an Achilles tendon rupture.  TREATMENT  Achilles tendinitis usually gets better over time. It can take weeks to months to heal completely. Treatment focuses on treating the symptoms and helping the injury heal. HOME CARE INSTRUCTIONS   Rest your Achilles tendon and avoid activities that cause pain.  Apply ice to the injured area:  Put ice in a plastic bag.  Place a towel between your skin and the bag.  Leave the ice on for 20 minutes, 2-3 times a  day  Try to avoid using the tendon (other than gentle range of motion) while the tendon is painful. Do not resume use until instructed by your health care provider. Then begin use gradually. Do not increase use to the point of pain. If pain does develop, decrease use and continue the above measures. Gradually increase activities that do not cause discomfort until you achieve normal use.  Do exercises to make your calf muscles stronger and more flexible. Your health care provider or physical therapist can recommend exercises for you to do.  Wrap your ankle with an elastic bandage or other wrap. This can help keep your tendon from moving too much. Your health care provider will show you how to wrap your ankle correctly.  Only take over-the-counter or prescription medicines for pain, discomfort, or fever as directed by your health care provider. SEEK MEDICAL CARE IF:   Your pain and swelling increase or pain is uncontrolled with medicines.  You develop new, unexplained symptoms or your symptoms get worse.  You are unable to move your toes or foot.  You develop warmth and swelling in your foot.  You have an unexplained temperature. MAKE SURE YOU:   Understand these instructions.  Will watch your condition.  Will get help right away if you are not doing well or get worse.   This information is not intended to replace advice given to you by your health care provider. Make sure you discuss any questions you have with your health care provider.   Document Released:   08/18/2005 Document Revised: 11/29/2014 Document Reviewed: 06/20/2013 Elsevier Interactive Patient Education 2016 Elsevier Inc.  

## 2016-01-26 NOTE — Progress Notes (Signed)
   See Encounters tab for problem-based medical decision making  __________________________________________________________  HPI:  60 year old woman here for follow-up of sleep apnea. The patient has coronary atherosclerosis status post CABG about 2 years ago. She was following up with her cardiologist who asked her why she was not being treated for her mild obstructive sleep apnea. The patient remembered that she was told she had sleep apnea in the past but did not remember why she did not start CPAP therapy. She says she continues to have snoring. Does endorse daytime somnolence. Occasionally awakes with morning headaches.  Second complaint today is of pain in her left heel for several weeks. Says that it is worst in the morning and then improves throughout the day. It's located over the left Achilles heel and radiates down to the calcaneus plantar surface. Denies any pain over the plantar fascia. No trauma to the foot. She uses a water bottle to roll underneath the Achilles and says that helps. Reports that it has limited her ability to walk and exercise.  Weight has been stable recently. She continues to try to work on her diet. No chest pain, angina, or dyspnea on exertion. No recent hospitalizations since her last visit here. No tobacco use.  __________________________________________________________  Problem List: Patient Active Problem List   Diagnosis Date Noted  . Achilles tendinitis 01/26/2016  . Atherosclerosis of aorta ([place]) 06/20/2015  . Osteoarthritis of both knees 11/07/2014  . Health care maintenance 10/09/2014  . Keloid skin disorder 07/26/2014  . Endometrial cancer, grade I ([place]) 06/24/2014  . Coronary atherosclerosis of native coronary artery 01/05/2014  . Obesity, Class III, BMI 40-49.9 (morbid obesity) ([place]) 05/10/2012  . Iron deficiency anemia 12/08/2011  . Obstructive sleep apnea 03/27/2010  . GERD 08/06/2008  . Essential hypertension 06/30/2007    Medications:  Reconciled today in Epic __________________________________________________________  Physical Exam:  Vital Signs: Filed Vitals:   01/26/16 0943  BP: 152/64  Pulse: 64  Temp: 98.1 F (36.7 C)  TempSrc: Oral  Height: 5\' 8"  (1.727 m)  Weight: 334 lb 1.6 oz (151.547 kg)  SpO2: 100%    Gen: Well appearing, NAD ENT: OP clear without erythema or exudate.  Neck: No cervical LAD, No thyromegaly or nodules, No JVD. CV: RRR, no murmurs Pulm: Normal effort, CTA throughout, no wheezing Abd: Soft, NT, ND, normal BS.  Ext: Warm, no edema, normal joints Skin: No atypical appearing moles. No rashes

## 2016-01-26 NOTE — Assessment & Plan Note (Signed)
Mild obstructive sleep apnea diagnosed in 2011 with a sleep study. I reviewed the records from that sleep study and looks like they did not have time to complete CPAP titration which is probably why CPAP was never initiated. Patient still does have symptoms of obstructive sleep apnea including daytime hypersomnolence. We talked about the risks and benefits of reevaluating and potentially initiating nocturnal noninvasive positive pressure ventilation. Plan is to refer her for split night titration study and we will use the results from that study to order a CPAP machine.

## 2016-01-26 NOTE — Assessment & Plan Note (Signed)
Blood pressure mildly elevated today. She reports much better readings at home with systolics around [id number]. Could be an element of white coat hypertension. Plan is to continue irbesartan and metoprolol at current doses. She will call me if home readings are elevated in the future.

## 2016-01-26 NOTE — Assessment & Plan Note (Signed)
Clinical course and exam today are consistent with Achilles tendinitis. We talked about supportive care including stretches and exercises. I gave her a handout with different exercises she can do at home. She will call us if pain is no better in a few weeks and we can refer to physical therapy.

## 2016-01-26 NOTE — Assessment & Plan Note (Signed)
Patient with diffuse atherosclerotic disease status post CABG 2 years ago. Currently doing well with no angina. Plan is to continue aspirin 81, rosuvastatin, metoprolol, and irbesartan.

## 2016-01-29 ENCOUNTER — Other Ambulatory Visit: Payer: Self-pay | Admitting: Student in an Organized Health Care Education/Training Program

## 2016-01-29 DIAGNOSIS — L905 Scar conditions and fibrosis of skin: Secondary | ICD-10-CM | POA: Diagnosis not present

## 2016-01-29 DIAGNOSIS — Z9013 Acquired absence of bilateral breasts and nipples: Secondary | ICD-10-CM | POA: Diagnosis not present

## 2016-03-08 ENCOUNTER — Encounter (HOSPITAL_BASED_OUTPATIENT_CLINIC_OR_DEPARTMENT_OTHER): Payer: Self-pay

## 2016-04-01 DIAGNOSIS — M7062 Trochanteric bursitis, left hip: Secondary | ICD-10-CM | POA: Diagnosis not present

## 2016-04-01 DIAGNOSIS — M722 Plantar fascial fibromatosis: Secondary | ICD-10-CM | POA: Diagnosis not present

## 2016-04-07 ENCOUNTER — Other Ambulatory Visit: Payer: Self-pay | Admitting: Internal Medicine

## 2016-04-07 MED ORDER — METOPROLOL TARTRATE 25 MG PO TABS: 37.5000 mg | ORAL_TABLET | Freq: Two times a day (BID) | ORAL | Status: DC

## 2016-04-07 NOTE — Telephone Encounter (Signed)
Rx request sent to pharmacy.  

## 2016-04-12 DIAGNOSIS — M79672 Pain in left foot: Secondary | ICD-10-CM | POA: Diagnosis not present

## 2016-04-12 DIAGNOSIS — M25552 Pain in left hip: Secondary | ICD-10-CM | POA: Diagnosis not present

## 2016-04-12 DIAGNOSIS — M722 Plantar fascial fibromatosis: Secondary | ICD-10-CM | POA: Diagnosis not present

## 2016-04-12 DIAGNOSIS — M7062 Trochanteric bursitis, left hip: Secondary | ICD-10-CM | POA: Diagnosis not present

## 2016-04-14 DIAGNOSIS — M25552 Pain in left hip: Secondary | ICD-10-CM | POA: Diagnosis not present

## 2016-04-14 DIAGNOSIS — M7062 Trochanteric bursitis, left hip: Secondary | ICD-10-CM | POA: Diagnosis not present

## 2016-04-14 DIAGNOSIS — M79672 Pain in left foot: Secondary | ICD-10-CM | POA: Diagnosis not present

## 2016-04-14 DIAGNOSIS — M722 Plantar fascial fibromatosis: Secondary | ICD-10-CM | POA: Diagnosis not present

## 2016-04-22 DIAGNOSIS — M79672 Pain in left foot: Secondary | ICD-10-CM | POA: Diagnosis not present

## 2016-04-22 DIAGNOSIS — M25552 Pain in left hip: Secondary | ICD-10-CM | POA: Diagnosis not present

## 2016-04-22 DIAGNOSIS — M722 Plantar fascial fibromatosis: Secondary | ICD-10-CM | POA: Diagnosis not present

## 2016-04-22 DIAGNOSIS — M7062 Trochanteric bursitis, left hip: Secondary | ICD-10-CM | POA: Diagnosis not present

## 2016-04-25 NOTE — H&P (Addendum)
  Subjective:    Patient ID: [name] is a 60 y.o. female.  Follow-up  7 months post op breast reduction. Here for revision of sternotomy scar. Main complaint is itching. Had CABG 2015, notes developed thick painful scar and was treated by dermatologist with steroids. Developed wide scar, relates this to her prior breast size.   Pathology benign. Right 2110 g left 1936 g  Highest wt 350 lb. Last MMG 02/2015, normal. No family history breast, colon, or ovarian ca. Patient herself with history uterine cancer.   Review of Systems     Objective:   Physical Exam   CV: normal heart sounds PULM: clear to auscultation   Bilateral NAC graft hypopigmentation with left nipple only remaining area R> L volume Nipple to IMF R 26 L 25 cm Midline sternotomy scar widened and hypopigmented with additional scars outside over vertical scar from staples 16 x 2.5 cm  Assessment:     Symptomatic sternotomy scar S/p CABG   S/p breast reduction with free nipple grafts  Plan:    Notes itching constant not relieved with creams or prior steroid injections. The increased width scar due in part to the steroid injection. Plan excision under sedation scar and closure. Reviewed risks recurrent symptoms or increased width, wound healing problems, infection, cardiopulmonary complications. Plan OP procedure.  Irene Limbo, MD [facility] [phone], pin [phone]

## 2016-05-02 ENCOUNTER — Ambulatory Visit (HOSPITAL_BASED_OUTPATIENT_CLINIC_OR_DEPARTMENT_OTHER): Payer: Medicare Other | Attending: Student in an Organized Health Care Education/Training Program

## 2016-05-02 ENCOUNTER — Encounter (HOSPITAL_BASED_OUTPATIENT_CLINIC_OR_DEPARTMENT_OTHER): Payer: Self-pay

## 2016-05-04 ENCOUNTER — Other Ambulatory Visit: Payer: Self-pay | Admitting: Plastic Surgery

## 2016-05-04 DIAGNOSIS — M79672 Pain in left foot: Secondary | ICD-10-CM | POA: Diagnosis not present

## 2016-05-04 DIAGNOSIS — Z1231 Encounter for screening mammogram for malignant neoplasm of breast: Secondary | ICD-10-CM

## 2016-05-04 DIAGNOSIS — M7062 Trochanteric bursitis, left hip: Secondary | ICD-10-CM | POA: Diagnosis not present

## 2016-05-04 DIAGNOSIS — M722 Plantar fascial fibromatosis: Secondary | ICD-10-CM | POA: Diagnosis not present

## 2016-05-04 DIAGNOSIS — M25552 Pain in left hip: Secondary | ICD-10-CM | POA: Diagnosis not present

## 2016-05-05 ENCOUNTER — Ambulatory Visit
Admission: RE | Admit: 2016-05-05 | Discharge: 2016-05-05 | Disposition: A | Discharge status: Home / Self Care | Payer: Medicare Other | Source: Ambulatory Visit | Attending: Plastic Surgery | Admitting: Plastic Surgery

## 2016-05-05 DIAGNOSIS — Z1231 Encounter for screening mammogram for malignant neoplasm of breast: Secondary | ICD-10-CM

## 2016-05-06 ENCOUNTER — Encounter (HOSPITAL_BASED_OUTPATIENT_CLINIC_OR_DEPARTMENT_OTHER): Payer: Self-pay | Admitting: *Deleted

## 2016-05-06 MED ORDER — CLINDAMYCIN PHOSPHATE 600 MG/50ML IV SOLN: 600.0000 mg | INTRAVENOUS | Status: DC

## 2016-05-06 NOTE — Progress Notes (Signed)
Pt denies any acute cardiopulmonary issues. Pt made aware to stop taking vitamins, fish oil, herbal medications, and NSAID's ( Mobic). Pt verbalized understanding of all pre-op instructions. Ebony Hail, [place], Anesthesia,  asked to review pt history ( see note). Pt verbalized understanding of all pre-op instructions.

## 2016-05-06 NOTE — Progress Notes (Signed)
Left voice message on nurses phone line at MD's office regarding Aspirin pre-op instructions; waiting for return call.

## 2016-05-06 NOTE — Progress Notes (Signed)
Anesthesia Chart Review: SAME DAY WORK-UP.  Patient is a 60 year old female scheduled for complex repair of chest 16 cm on 05/07/16 by Dr. Iran Planas. DX: Sternal scar.  History includes CAD s/p CABG X 5 (LIMA-LAD, SVG-DIAG, SVG-OM1-OM2, SVG-PDA) 01/09/14, dental extractions 04/11/14, anemia with DUB s/p hysterectomy 06/03/14 (stage 1A endometrioid carcinoma), bilateral breast reduction with free nipple grafts 10/10/15, palpitations, dyslipidemia, HTN, GERD, osteoporosis, OSA (no CPAP), post-operative N/V, morbid obesity, never smoker.   - PCP is listed as Dr. Axel Filler with [facility], last visit 01/26/16 with plans to refer her for a split night titration study and consideration of CPAP. This test is still listed as "future." By notes, 2011 sleep study showed mild OSA.  - Cardiologist is Dr. Debara Pickett, last visit 12/16/15 with six month follow-up planned. She told him that she had possible upcoming scar revision of her chest incision.  - CT surgeon is Dr. Prescott Gum.   Meds include albuterol, ASA 81mg , Nexium, 65 Fe, Atrovent, Avapro, Lopressor, Crestor.   12/16/15 EKG: NSR.  01/05/14 Echo:  - Left ventricle: Limited apical views. No gross regional wall motion abnormalities. Papillary muscle calcification noted. The cavity size was normal. Wall thickness was normal. Systolic function was normal. The estimated ejection fraction was in the range of 60% to 65%. Left ventricular diastolic function parameters were normal. - Aortic valve: Mildly calcified annulus. Mildly thickened leaflets. Transvalvular velocity was within the normal range. There was no stenosis. - Mitral valve: Calcified annulus. Trivial regurgitation. - Left atrium: The atrium was mildly dilated.  Her last stress test and cardiac cath were done in 12/2013 prior to her CABG. Reports are in [place].  01/05/14 Carotid duplex: Bilateral: intimal wall thickening CCA. Mild mixed plaque origin and proximal ICA and ECA. 1-39% ICA  stenosis. Vertebral artery flow is antegrade.  05/16/15 CXR: IMPRESSION: Post CABG. Chronic interstitial prominence. No acute abnormalities.  She will get labs on arrival. If labs acceptable and otherwise no acute changes then I would anticipate that she could proceed as planned.  George Hugh [facility] Short Stay Center/Anesthesiology Phone [phone] 05/06/2016 4:08 PM

## 2016-05-07 ENCOUNTER — Ambulatory Visit (HOSPITAL_COMMUNITY): Payer: Medicare Other | Admitting: Vascular Surgery

## 2016-05-07 ENCOUNTER — Ambulatory Visit (HOSPITAL_COMMUNITY)
Admission: RE | Admit: 2016-05-07 | Discharge: 2016-05-07 | Disposition: A | Discharge status: Home / Self Care | Payer: Medicare Other | Source: Ambulatory Visit | Attending: Plastic Surgery | Admitting: Plastic Surgery

## 2016-05-07 ENCOUNTER — Encounter (HOSPITAL_COMMUNITY)
Admission: RE | Disposition: A | Discharge status: Home / Self Care | Payer: Self-pay | Source: Ambulatory Visit | Attending: Plastic Surgery

## 2016-05-07 ENCOUNTER — Encounter (HOSPITAL_COMMUNITY): Payer: Self-pay | Admitting: *Deleted

## 2016-05-07 DIAGNOSIS — G4733 Obstructive sleep apnea (adult) (pediatric): Secondary | ICD-10-CM | POA: Diagnosis not present

## 2016-05-07 DIAGNOSIS — Z8542 Personal history of malignant neoplasm of other parts of uterus: Secondary | ICD-10-CM | POA: Insufficient documentation

## 2016-05-07 DIAGNOSIS — J45909 Unspecified asthma, uncomplicated: Secondary | ICD-10-CM | POA: Diagnosis not present

## 2016-05-07 DIAGNOSIS — L905 Scar conditions and fibrosis of skin: Secondary | ICD-10-CM | POA: Insufficient documentation

## 2016-05-07 DIAGNOSIS — M1990 Unspecified osteoarthritis, unspecified site: Secondary | ICD-10-CM | POA: Diagnosis not present

## 2016-05-07 DIAGNOSIS — I2510 Atherosclerotic heart disease of native coronary artery without angina pectoris: Secondary | ICD-10-CM | POA: Diagnosis not present

## 2016-05-07 DIAGNOSIS — I252 Old myocardial infarction: Secondary | ICD-10-CM | POA: Insufficient documentation

## 2016-05-07 DIAGNOSIS — K219 Gastro-esophageal reflux disease without esophagitis: Secondary | ICD-10-CM | POA: Insufficient documentation

## 2016-05-07 DIAGNOSIS — I739 Peripheral vascular disease, unspecified: Secondary | ICD-10-CM | POA: Diagnosis not present

## 2016-05-07 DIAGNOSIS — I10 Essential (primary) hypertension: Secondary | ICD-10-CM | POA: Diagnosis not present

## 2016-05-07 DIAGNOSIS — Z951 Presence of aortocoronary bypass graft: Secondary | ICD-10-CM | POA: Diagnosis not present

## 2016-05-07 DIAGNOSIS — E785 Hyperlipidemia, unspecified: Secondary | ICD-10-CM | POA: Diagnosis not present

## 2016-05-07 DIAGNOSIS — D649 Anemia, unspecified: Secondary | ICD-10-CM | POA: Diagnosis not present

## 2016-05-07 DIAGNOSIS — M810 Age-related osteoporosis without current pathological fracture: Secondary | ICD-10-CM | POA: Diagnosis not present

## 2016-05-07 HISTORY — PX: LESION EXCISION WITH COMPLEX REPAIR: SHX6700

## 2016-05-07 HISTORY — DX: Carpal tunnel syndrome, bilateral upper limbs: G56.03

## 2016-05-07 HISTORY — DX: Unspecified abdominal hernia without obstruction or gangrene: K46.9

## 2016-05-07 LAB — BASIC METABOLIC PANEL
ANION GAP: 7 (ref 5–15)
BUN: 12 mg/dL (ref 6–20)
CO2: 26 mmol/L (ref 22–32)
Calcium: 9 mg/dL (ref 8.9–10.3)
Chloride: 103 mmol/L (ref 101–111)
Creatinine, Ser: 0.9 mg/dL (ref 0.44–1.00)
GFR calc Af Amer: >60 mL/min (ref >60)
GFR calc non Af Amer: >60 mL/min (ref >60)
GLUCOSE: 113 mg/dL — AB (ref 65–99)
POTASSIUM: 3.7 mmol/L (ref 3.5–5.1)
Sodium: 136 mmol/L (ref 135–145)

## 2016-05-07 LAB — CBC
HEMATOCRIT: 37.7 % (ref 36.0–46.0)
HEMOGLOBIN: 12.3 g/dL (ref 12.0–15.0)
MCH: 26.5 pg (ref 26.0–34.0)
MCHC: 32.6 g/dL (ref 30.0–36.0)
MCV: 81.3 fL (ref 78.0–100.0)
Platelets: 222 10*3/uL (ref 150–400)
RBC: 4.64 MIL/uL (ref 3.87–5.11)
RDW: 15.3 % (ref 11.5–15.5)
WBC: 7 10*3/uL (ref 4.0–10.5)

## 2016-05-07 SURGERY — LESION EXCISION WITH COMPLEX REPAIR
Anesthesia: General | Site: Chest

## 2016-05-07 MED ORDER — FENTANYL CITRATE (PF) 100 MCG/2ML IJ SOLN: 25.0000 ug | INTRAMUSCULAR | Status: DC | PRN

## 2016-05-07 MED ORDER — FENTANYL CITRATE (PF) 100 MCG/2ML IJ SOLN
INTRAMUSCULAR | Status: DC | PRN
  Administered 2016-05-07: 100 ug via INTRAVENOUS

## 2016-05-07 MED ORDER — ONDANSETRON HCL 4 MG/2ML IJ SOLN
INTRAMUSCULAR | Status: DC | PRN
  Administered 2016-05-07 (×2): 4 mg via INTRAVENOUS

## 2016-05-07 MED ORDER — PHENYLEPHRINE HCL 10 MG/ML IJ SOLN
INTRAMUSCULAR | Status: DC | PRN
  Administered 2016-05-07: 80 ug via INTRAVENOUS
  Administered 2016-05-07: 120 ug via INTRAVENOUS
  Administered 2016-05-07: 40 ug via INTRAVENOUS
  Administered 2016-05-07 (×3): 80 ug via INTRAVENOUS

## 2016-05-07 MED ORDER — DEXAMETHASONE SODIUM PHOSPHATE 10 MG/ML IJ SOLN
INTRAMUSCULAR | Status: AC
  Filled 2016-05-07: qty 1

## 2016-05-07 MED ORDER — LIDOCAINE 2% (20 MG/ML) 5 ML SYRINGE
INTRAMUSCULAR | Status: AC
Start: 2016-05-07 — End: 2016-05-07
  Filled 2016-05-07: qty 5

## 2016-05-07 MED ORDER — BUPIVACAINE-EPINEPHRINE (PF) 0.25% -1:200000 IJ SOLN
INTRAMUSCULAR | Status: AC
  Filled 2016-05-07: qty 30

## 2016-05-07 MED ORDER — MIDAZOLAM HCL 5 MG/5ML IJ SOLN
INTRAMUSCULAR | Status: DC | PRN
  Administered 2016-05-07: 2 mg via INTRAVENOUS

## 2016-05-07 MED ORDER — LIDOCAINE HCL (CARDIAC) 20 MG/ML IV SOLN
INTRAVENOUS | Status: DC | PRN
  Administered 2016-05-07: 100 mg via INTRAVENOUS

## 2016-05-07 MED ORDER — PHENYLEPHRINE 40 MCG/ML (10ML) SYRINGE FOR IV PUSH (FOR BLOOD PRESSURE SUPPORT)
PREFILLED_SYRINGE | INTRAVENOUS | Status: AC
  Filled 2016-05-07: qty 10

## 2016-05-07 MED ORDER — ROCURONIUM BROMIDE 50 MG/5ML IV SOLN
INTRAVENOUS | Status: AC
  Filled 2016-05-07: qty 1

## 2016-05-07 MED ORDER — ONDANSETRON HCL 4 MG/2ML IJ SOLN: 4.0000 mg | Freq: Once | INTRAMUSCULAR | Status: DC | PRN

## 2016-05-07 MED ORDER — 0.9 % SODIUM CHLORIDE (POUR BTL) OPTIME
TOPICAL | Status: DC | PRN
  Administered 2016-05-07: 1000 mL

## 2016-05-07 MED ORDER — SUGAMMADEX SODIUM 500 MG/5ML IV SOLN
INTRAVENOUS | Status: DC | PRN
  Administered 2016-05-07: 200 mg via INTRAVENOUS

## 2016-05-07 MED ORDER — ALBUTEROL SULFATE (2.5 MG/3ML) 0.083% IN NEBU: 2.5000 mg | INHALATION_SOLUTION | Freq: Once | RESPIRATORY_TRACT | Status: DC

## 2016-05-07 MED ORDER — FENTANYL CITRATE (PF) 250 MCG/5ML IJ SOLN
INTRAMUSCULAR | Status: AC
  Filled 2016-05-07: qty 5

## 2016-05-07 MED ORDER — CLINDAMYCIN PHOSPHATE 600 MG/50ML IV SOLN
INTRAVENOUS | Status: AC
  Administered 2016-05-07: 600 mg via INTRAVENOUS
  Filled 2016-05-07: qty 50

## 2016-05-07 MED ORDER — MIDAZOLAM HCL 2 MG/2ML IJ SOLN
INTRAMUSCULAR | Status: AC
  Filled 2016-05-07: qty 2

## 2016-05-07 MED ORDER — SOD CITRATE-CITRIC ACID 500-334 MG/5ML PO SOLN: 30.0000 mL | Freq: Once | ORAL | Status: DC

## 2016-05-07 MED ORDER — ONDANSETRON HCL 4 MG/2ML IJ SOLN
INTRAMUSCULAR | Status: AC
  Filled 2016-05-07: qty 2

## 2016-05-07 MED ORDER — MEPERIDINE HCL 25 MG/ML IJ SOLN: 6.2500 mg | INTRAMUSCULAR | Status: DC | PRN

## 2016-05-07 MED ORDER — EPHEDRINE SULFATE 50 MG/ML IJ SOLN
INTRAMUSCULAR | Status: DC | PRN
  Administered 2016-05-07 (×2): 10 mg via INTRAVENOUS

## 2016-05-07 MED ORDER — ROCURONIUM BROMIDE 100 MG/10ML IV SOLN
INTRAVENOUS | Status: DC | PRN
  Administered 2016-05-07: 40 mg via INTRAVENOUS

## 2016-05-07 MED ORDER — LACTATED RINGERS IV SOLN
INTRAVENOUS | Status: DC
  Administered 2016-05-07 (×2): via INTRAVENOUS

## 2016-05-07 MED ORDER — PROPOFOL 10 MG/ML IV BOLUS
INTRAVENOUS | Status: DC | PRN
  Administered 2016-05-07: 200 mg via INTRAVENOUS

## 2016-05-07 MED ORDER — BUPIVACAINE-EPINEPHRINE 0.25% -1:200000 IJ SOLN
INTRAMUSCULAR | Status: DC | PRN
  Administered 2016-05-07: 19 mL

## 2016-05-07 MED ORDER — PROPOFOL 10 MG/ML IV BOLUS
INTRAVENOUS | Status: AC
  Filled 2016-05-07: qty 20

## 2016-05-07 MED ORDER — SUGAMMADEX SODIUM 200 MG/2ML IV SOLN
INTRAVENOUS | Status: AC
  Filled 2016-05-07: qty 2

## 2016-05-07 SURGICAL SUPPLY — 49 items
APPLIER CLIP 9.375 MED OPEN (MISCELLANEOUS)
BIOPATCH RED 1 DISK 7.0 (GAUZE/BANDAGES/DRESSINGS) IMPLANT
BIOPATCH RED 1IN DISK 7.0MM (GAUZE/BANDAGES/DRESSINGS)
BLADE 10 SAFETY STRL DISP (BLADE) IMPLANT
CANISTER SUCTION 2500CC (MISCELLANEOUS) ×3 IMPLANT
CHLORAPREP W/TINT 26ML (MISCELLANEOUS) ×3 IMPLANT
CLIP APPLIE 9.375 MED OPEN (MISCELLANEOUS) IMPLANT
CLOSURE WOUND 1/2 X4 (GAUZE/BANDAGES/DRESSINGS)
COVER SURGICAL LIGHT HANDLE (MISCELLANEOUS) ×3 IMPLANT
DRAIN CHANNEL 15F RND FF W/TCR (WOUND CARE) IMPLANT
DRAPE ORTHO SPLIT 77X108 STRL (DRAPES) ×4
DRAPE PROXIMA HALF (DRAPES) IMPLANT
DRAPE SURG ORHT 6 SPLT 77X108 (DRAPES) ×2 IMPLANT
DRAPE WARM FLUID 44X44 (DRAPE) IMPLANT
DRSG MEPILEX BORDER 4X12 (GAUZE/BANDAGES/DRESSINGS) ×3 IMPLANT
DRSG PAD ABDOMINAL 8X10 ST (GAUZE/BANDAGES/DRESSINGS) IMPLANT
ELECT CAUTERY BLADE 6.4 (BLADE) IMPLANT
ELECT COATED BLADE 2.86 ST (ELECTRODE) ×3 IMPLANT
ELECT REM PT RETURN 9FT ADLT (ELECTROSURGICAL) ×3
ELECTRODE REM PT RTRN 9FT ADLT (ELECTROSURGICAL) ×1 IMPLANT
EVACUATOR SILICONE 100CC (DRAIN) IMPLANT
GAUZE SPONGE 4X4 12PLY STRL (GAUZE/BANDAGES/DRESSINGS) IMPLANT
GAUZE XEROFORM 5X9 LF (GAUZE/BANDAGES/DRESSINGS) IMPLANT
GLOVE BIO SURGEON STRL SZ 6 (GLOVE) ×3 IMPLANT
GLOVE BIOGEL PI IND STRL 7.0 (GLOVE) ×1 IMPLANT
GLOVE BIOGEL PI IND STRL 8.5 (GLOVE) ×1 IMPLANT
GLOVE BIOGEL PI INDICATOR 7.0 (GLOVE) ×2
GLOVE BIOGEL PI INDICATOR 8.5 (GLOVE) ×2
GLOVE SURG SS PI 6.0 STRL IVOR (GLOVE) ×3 IMPLANT
GLOVE SURG SS PI 7.0 STRL IVOR (GLOVE) ×3 IMPLANT
GLOVE SURG SS PI 8.0 STRL IVOR (GLOVE) ×3 IMPLANT
GOWN STRL REUS W/ TWL LRG LVL3 (GOWN DISPOSABLE) ×2 IMPLANT
GOWN STRL REUS W/TWL LRG LVL3 (GOWN DISPOSABLE) ×4
KIT BASIN OR (CUSTOM PROCEDURE TRAY) ×3 IMPLANT
KIT ROOM TURNOVER OR (KITS) ×3 IMPLANT
LIQUID BAND (GAUZE/BANDAGES/DRESSINGS) ×3 IMPLANT
MARKER SKIN DUAL TIP RULER LAB (MISCELLANEOUS) ×3 IMPLANT
NS IRRIG 1000ML POUR BTL (IV SOLUTION) ×3 IMPLANT
PACK GENERAL/GYN (CUSTOM PROCEDURE TRAY) ×3 IMPLANT
PAD ARMBOARD 7.5X6 YLW CONV (MISCELLANEOUS) ×3 IMPLANT
STAPLER VISISTAT 35W (STAPLE) IMPLANT
STRIP CLOSURE SKIN 1/2X4 (GAUZE/BANDAGES/DRESSINGS) IMPLANT
SUT MNCRL AB 3-0 PS2 18 (SUTURE) ×6 IMPLANT
SUT MNCRL AB 4-0 PS2 18 (SUTURE) ×6 IMPLANT
SUT VIC AB 3-0 SH 27 (SUTURE) ×2
SUT VIC AB 3-0 SH 27X BRD (SUTURE) ×1 IMPLANT
TOWEL OR 17X24 6PK STRL BLUE (TOWEL DISPOSABLE) ×3 IMPLANT
TOWEL OR 17X26 10 PK STRL BLUE (TOWEL DISPOSABLE) ×3 IMPLANT
TRAY FOLEY CATH 14FRSI W/METER (CATHETERS) IMPLANT

## 2016-05-07 NOTE — Anesthesia Procedure Notes (Signed)
Procedure Name: Intubation Date/Time: 05/07/2016 11:13 AM Performed by: Freddie Breech Pre-anesthesia Checklist: Patient identified, Emergency Drugs available, Patient being monitored, Suction available and Timeout performed Patient Re-evaluated:Patient Re-evaluated prior to inductionOxygen Delivery Method: Circle system utilized Preoxygenation: Pre-oxygenation with 100% oxygen Intubation Type: IV induction Ventilation: Mask ventilation without difficulty and Oral airway inserted - appropriate to patient size Laryngoscope Size: Mac and 3 Grade View: Grade II Tube type: Oral Tube size: 7.0 mm Number of attempts: 1 Airway Equipment and Method: Patient positioned with wedge pillow and Stylet Placement Confirmation: ETT inserted through vocal cords under direct vision,  positive ETCO2,  CO2 detector and breath sounds checked- equal and bilateral Secured at: 22 cm Tube secured with: Tape Dental Injury: Teeth and Oropharynx as per pre-operative assessment

## 2016-05-07 NOTE — Anesthesia Preprocedure Evaluation (Signed)
Anesthesia Evaluation  Patient identified by MRN, date of birth, ID band Patient awake    Reviewed: Allergy & Precautions, NPO status , Patient's Chart, lab work & pertinent test results  History of Anesthesia Complications (+) PONV  Airway Mallampati: II  TM Distance: >3 FB Neck ROM: Full    Dental  (+) Dental Advisory Given, Poor Dentition   Pulmonary asthma , sleep apnea ,    Pulmonary exam normal        Cardiovascular hypertension, Pt. on medications + CAD, + Past MI, + CABG and + Peripheral Vascular Disease  Normal cardiovascular exam     Neuro/Psych  Headaches,    GI/Hepatic GERD  Medicated and Controlled,  Endo/Other  Morbid obesity  Renal/GU      Musculoskeletal  (+) Arthritis ,   Abdominal (+) + obese,   Peds  Hematology   Anesthesia Other Findings   Reproductive/Obstetrics                             Anesthesia Physical  Anesthesia Plan  ASA: III  Anesthesia Plan: General   Post-op Pain Management:    Induction: Intravenous  Airway Management Planned: Oral ETT  Additional Equipment:   Intra-op Plan:   Post-operative Plan: Extubation in OR  Informed Consent: I have reviewed the patients History and Physical, chart, labs and discussed the procedure including the risks, benefits and alternatives for the proposed anesthesia with the patient or authorized representative who has indicated his/her understanding and acceptance.   Dental advisory given  Plan Discussed with: Surgeon and CRNA  Anesthesia Plan Comments:         Anesthesia Quick Evaluation

## 2016-05-07 NOTE — Anesthesia Postprocedure Evaluation (Signed)
Anesthesia Post Note  Patient: [name]  Procedure(s) Performed: Procedure(s) (LRB): COMPLEX REPAIR OF CHEST 16 CM (N/A)  Patient location during evaluation: PACU Anesthesia Type: General Level of consciousness: awake Pain management: pain level controlled Vital Signs Assessment: post-procedure vital signs reviewed and stable Respiratory status: spontaneous breathing Cardiovascular status: stable Anesthetic complications: no    Last Vitals:  Filed Vitals:   05/07/16 1227 05/07/16 1240  BP: 138/66 134/70  Pulse: 93 92  Temp:  36.4 C  Resp: 20 19    Last Pain: There were no vitals filed for this visit.               EDWARDS,Armarion Greek

## 2016-05-07 NOTE — Transfer of Care (Signed)
Immediate Anesthesia Transfer of Care Note  Patient: [name]  Procedure(s) Performed: Procedure(s): COMPLEX REPAIR OF CHEST 16 CM (N/A)  Patient Location: PACU  Anesthesia Type:General  Level of Consciousness:  sedated, patient cooperative and responds to stimulation  Airway & Oxygen Therapy:Patient Spontanous Breathing and Patient connected to face mask oxgen  Post-op Assessment:  Report given to PACU RN and Post -op Vital signs reviewed and stable  Post vital signs:  Reviewed and stable  Last Vitals:  Filed Vitals:   05/07/16 0742  BP: 137/95  Pulse: 67  Temp: 36.8 C  Resp: 18    Complications: No apparent anesthesia complications

## 2016-05-07 NOTE — Op Note (Signed)
Operative Note   DATE OF OPERATION: 6.16.17  LOCATION: [facility] OR-outpatient  SURGICAL DIVISION: Plastic Surgery  PREOPERATIVE DIAGNOSES:  1. Symptomatic scar chest 2. History CABG  POSTOPERATIVE DIAGNOSES:  same  PROCEDURE:  1. Complex repair trunk 13 cm  SURGEON: Irene Limbo MD MBA  ASSISTANT: Harrel Lemon, RN  ANESTHESIA:  General.   EBL: minimal  COMPLICATIONS: None immediate.   INDICATIONS FOR PROCEDURE:  The patient, [name], is a 60 y.o. female born on Jun 23, 1956, is here for scar revision chest. Patient underwent CABG and developed hypertrophic scar with associated itching and has had prior steroid injections to scar.   FINDINGS: 13 cm sternal scar with adjacent scars from staples.  DESCRIPTION OF PROCEDURE:  The patient's operative site was marked with the patient in the preoperative area. The patient was taken to the operating room. SCDs were placed and IV antibiotics were given. The patient's operative site was prepped and draped in a sterile fashion. A time out was performed and all information was confirmed to be correct. Local anesthetic infiltrated surrounding area for resection. Sharp excision of central scar and adjacent scars from staples completed with knife full thickness. Hemostasis obtained and layered closure completed with 3-0 vicryl and 3-0 monocryl in deep dermis interrupted. Skin closure completed with running 4-0 monocryl subcuticular. Tissue adhesive and adhesive foam dressing applied.   The patient was allowed to wake from anesthesia, extubated and taken to the recovery room in satisfactory condition.   SPECIMENS: none  DRAINS: none  Irene Limbo, MD [facility] Plastic [facility] Reconstructive Surgery [phone], pin [phone]

## 2016-05-07 NOTE — Interval H&P Note (Signed)
History and Physical Interval Note:  05/07/2016 9:37 AM  [name]  has presented today for surgery, with the diagnosis of SCAR STERNUM  The various methods of treatment have been discussed with the patient and family. After consideration of risks, benefits and other options for treatment, the patient has consented to  Procedure(s): COMPLEX REPAIR OF CHEST 16 CM (N/A) as a surgical intervention .  The patient's history has been reviewed, patient examined, no change in status, stable for surgery.  I have reviewed the patient's chart and labs.  Questions were answered to the patient's satisfaction.     Eligh Rybacki

## 2016-05-10 ENCOUNTER — Encounter (HOSPITAL_COMMUNITY): Payer: Self-pay | Admitting: Plastic Surgery

## 2016-06-13 ENCOUNTER — Other Ambulatory Visit: Payer: Self-pay | Admitting: Student in an Organized Health Care Education/Training Program

## 2016-07-01 ENCOUNTER — Ambulatory Visit (INDEPENDENT_AMBULATORY_CARE_PROVIDER_SITE_OTHER): Payer: Medicare Other | Admitting: Internal Medicine

## 2016-07-01 ENCOUNTER — Encounter: Payer: Self-pay | Admitting: Internal Medicine

## 2016-07-01 ENCOUNTER — Encounter (INDEPENDENT_AMBULATORY_CARE_PROVIDER_SITE_OTHER): Payer: Self-pay

## 2016-07-01 VITALS — BP 138/86 | HR 68 | Ht 68.0 in | Wt 334.4 lb

## 2016-07-01 DIAGNOSIS — I10 Essential (primary) hypertension: Secondary | ICD-10-CM | POA: Diagnosis not present

## 2016-07-01 DIAGNOSIS — I2510 Atherosclerotic heart disease of native coronary artery without angina pectoris: Secondary | ICD-10-CM | POA: Diagnosis not present

## 2016-07-01 DIAGNOSIS — E785 Hyperlipidemia, unspecified: Secondary | ICD-10-CM

## 2016-07-01 DIAGNOSIS — Z951 Presence of aortocoronary bypass graft: Secondary | ICD-10-CM | POA: Diagnosis not present

## 2016-07-01 DIAGNOSIS — G4733 Obstructive sleep apnea (adult) (pediatric): Secondary | ICD-10-CM

## 2016-07-01 MED ORDER — FUROSEMIDE 20 MG PO TABS
20.0000 mg | ORAL_TABLET | Freq: Every day | ORAL | 3 refills | Status: DC | PRN
Start: 2016-07-01 — End: 2016-11-05

## 2016-07-01 NOTE — Patient Instructions (Signed)
Dr. Debara Pickett has prescribed: furosemide (Lasix) 20mg  to take once daily as needed for swelling  Dr. Debara Pickett recommends purchasing a compression stocking for the left leg  Your physician wants you to follow-up in: 1 year with Dr. Debara Pickett. You will receive a reminder letter in the mail two months in advance. If you don't receive a letter, please call our office to schedule the follow-up appointment.

## 2016-07-01 NOTE — Progress Notes (Signed)
OFFICE NOTE  Chief Complaint:  Left leg swelling and tingling  Primary Care Physician: Axel Filler, MD  HPI:  [name] is a 60 year old African American obese female presented to 01/01/14 with symptoms of unstable angina. I had seen her once in 2012, but not since then.  She recently had been having chest pain and presented urgently when she became short of breath at home and had chest tightness, which started as right arm pain and right chest pain, then moved substernally. She ultimately underwent cardiac catheterization via right radial artery which demonstrated severe three-vessel CAD with graftable target vessels. Echocardiogram showed good LV function with some left ventricular hypertrophy from her hypertension and there does not appear to be significant valve disease. Carotid dopplers showed a 1-39% bilateral carotid artery stenosis. She underwent a CABG x 5 on 01/09/2014. Left internal mammary artery to LAD, saphenous vein graft to diagonal, sequential saphenous vein graft to OM1 and OM2, saphenous vein graft to posterior descending. She had multiple teeth extractions prior to surgery. She did well post op. She was anemic but did not need transfusion. At discharge she went to SNF for rehab-she did not stay the recommended time frame due to BR not being available when she needed it. She was not given discharge medications when she left SNF. She saw Cecilie Kicks, FNP, in followup for her hospitalization and was restarted on some of her medications.   [name] was seen today in the office for follow-up. At her last office visit I cleared her for surgery and she underwent hysterectomy and has had resolution of her bleeding problems. She is now contemplating right knee surgery and also breast reduction surgery. She's in the interim seen in the hospital for palpitations. She reports those have improved with medication adjustment. She's decreased her Lasix now to every other day and  I think she can probably take it as needed. She denies any chest pain worsening shortness of breath. She is undergoing injections to reduce the size of her keloid scar.  I saw [name] back in the office today. She's been undergoing rehabilitation and really wants to continue it. She's managed to lose some weight and feels better. Recently though she's been having increased blood pressures. Does have ranged up to 99991111 systolic. She had a small increase in her lisinopril to 10 mg daily with an improvement however this staff does not want to continue her at rehabilitation until her blood pressure is better controlled. She denies any chest pain or palpitations.  [name] returns today for follow-up. She continues in cardiac rehabilitation and is doing well. At his last office visit I increased her lisinopril to 20 mg daily and her blood pressure is now much better controlled. Overall she is feeling well. She is undergoing cesarean injections of the keloid scar that she has in her bypass site. She is concerned about a dilated varicose vein that she's developed at the inferior margin.  Since I last saw her, she is doing well. She underwent breast reductions surgery, which has helped her CABG scar some, but has failed to lose significant weight. BMI is now over 50. She does get short of breath but not worsening. Blood pressure is well-controlled and cough has gone away after switching from ACE-I to ARB. She reports slightly more palpitations recently. She has episodes of palpitations, worse at night that occur 3-4x a month.  07/01/2016  [name] returns today for follow-up. She reports some left leg  swelling. She has a history of left knee surgery and has had swelling since that time. She occasionally gets some swelling in the right leg which is where her saphenous vein grafts were taken. She still reports numbness and tingling around the vein harvest sites. She has had numbness and tingling of the chest  which is exquisitely tender to palpation although she has a "high pain tolerance". She recently underwent revision of the keloid scar to her median sternotomy. That does appear somewhat improved. She denies any worsening shortness of breath. Weight is stable.  PMHx:  Past Medical History:  Diagnosis Date  . Acute urinary retention s/p Foley 01/07/2012  . Anemia   . Anginal pain ([place])   . Asthma    History of  . Blood dyscrasia    ABNORMAL VAGINAL BLEEDING  . Carpal tunnel syndrome, bilateral   . Chronic headaches   . Coronary artery disease   . Cyst of eye    "behind eye"  . Dyslipidemia   . Dysrhythmia    occ palpitations   . GERD (gastroesophageal reflux disease)   . Headache(784.0)    occasional   . Heart murmur   . Hemorrhoids, internal, with bleeding & prolapse 12/13/2011  . Hernia, abdominal   . History of kidney stones   . Hypertension   . Iron deficiency 12-07-2011  . Morbid obesity ([place])   . Myocardial infarction [facility])    unsure when; per cardiologist report  . Neuropathy ([place])   . Osteoarthritis   . Osteoporosis   . PONV (postoperative nausea and vomiting)   . Postmenopausal bleeding   . S/P CABG (coronary artery bypass graft)    x5 - LIMA to LAD, SVG to diagonal, SVG to OM1, SVG to OM2, SVG to PDA (Dr. Prescott Gum)  . Seasonal allergies   . Sleep apnea    no CPAP machine; sleep study 02/2010 REM AHI 61.7/hr, total sleep REM 14.8/hr  . Uterine cancer ([place]) 7/15   clinical stage IA grade 1 endometrioid endometrial cancer    Past Surgical History:  Procedure Laterality Date  . ABDOMINAL HYSTERECTOMY    . BREAST REDUCTION SURGERY Bilateral 10/10/2015   Procedure: BILATERAL MAMMARY REDUCTION  (BREAST) WITH FREE NIPPLE GRAFT;  Surgeon: Irene Limbo, MD;  Location: [facility];  Service: Plastics;  Laterality: Bilateral;  . BREAST SURGERY     reduction  . CARDIAC CATHETERIZATION    . COLONOSCOPY  2009  . COLONOSCOPY W/ BIOPSIES AND POLYPECTOMY    . CORONARY ARTERY  BYPASS GRAFT N/A 01/09/2014   Procedure: CORONARY ARTERY BYPASS GRAFTING (CABG) x 5 using left internal mammary artery and right leg greater saphenous vein harvested endoscopically;  Surgeon: Ivin Poot, MD;  Location: [facility];  Service: Open Heart Surgery;  Laterality: N/A;  please use bed extenders and breast binder  . INTRAOPERATIVE TRANSESOPHAGEAL ECHOCARDIOGRAM N/A 01/09/2014   Procedure: INTRAOPERATIVE TRANSESOPHAGEAL ECHOCARDIOGRAM;  Surgeon: Ivin Poot, MD;  Location: [place];  Service: Open Heart Surgery;  Laterality: N/A;  . KNEE ARTHROSCOPY  1991  . KNEE SURGERY Bilateral 1999  . LEFT HEART CATHETERIZATION WITH CORONARY ANGIOGRAM N/A 01/04/2014   Procedure: LEFT HEART CATHETERIZATION WITH CORONARY ANGIOGRAM;  Surgeon: Sinclair Grooms, MD;  Location: [facility] CATH LAB;  Service: Cardiovascular;  Laterality: N/A;  . LESION EXCISION WITH COMPLEX REPAIR N/A 05/07/2016   Procedure: COMPLEX REPAIR OF CHEST 16 CM;  Surgeon: Irene Limbo, MD;  Location: [facility];  Service: Plastics;  Laterality: N/A;  .  MULTIPLE EXTRACTIONS WITH ALVEOLOPLASTY N/A 04/11/2014   Procedure: Extraction of tooth #'s 1,2,8,16 with alveoloplasty, maxillary tuberosity reductions, and gross debridement of remaining teeth.;  Surgeon: Lenn Cal, DDS;  Location: [facility];  Service: Oral Surgery;  Laterality: N/A;  . MULTIPLE TOOTH EXTRACTIONS    . NM MYOCAR PERF WALL MOTION  01/2010   dipyridamole myoview - moderate perfusion defect in basal inferoseptal, basal inferior, mid inferoseptal, mid inferior, apical inferior region; EF 56%  . TEE WITHOUT CARDIOVERSION  01/2010   EF 60-65%, small, flat, non-infiltrating, calcified, fixed apical/septal mass  . TOTAL KNEE ARTHROPLASTY Left 04/29/2011  . transanal hemorrhoidal dearterliaization  01/06/12   with external hemorrhoid removal  . UNILATERAL SALPINGECTOMY Right 06/03/2014   Procedure: UNILATERAL SALPINGECTOMY;  Surgeon: Lavonia Drafts, MD;  Location: [facility];   Service: Gynecology;  Laterality: Right;  Marland Kitchen VAGINAL HYSTERECTOMY N/A 06/03/2014   Procedure: HYSTERECTOMY VAGINAL;  Surgeon: Lavonia Drafts, MD;  Location: [facility];  Service: Gynecology;  Laterality: N/A;  . WISDOM TOOTH EXTRACTION      FAMHx:  Family History  Problem Relation Age of Onset  . Colon cancer Neg Hx   . Hypertension Sister   . Heart failure Sister   . Heart disease Sister   . Diabetes type II Sister   . Kidney disease Brother     hypertension  . Heart attack Mother 19  . Heart disease Mother   . Hypertension Mother   . Diabetes Mother   . Hypertension Child     SOCHx:   reports that she has never smoked. She has never used smokeless tobacco. She reports that she does not drink alcohol or use drugs.  ALLERGIES:  Allergies  Allergen Reactions  . Cephalosporins Anaphylaxis, Swelling and Other (See Comments)    Tongue swelling, gum pain  . Eicosapentaenoic Acid (Epa) Shortness Of Breath  . Fish-Derived Products Shortness Of Breath  . Peanuts [Peanut Oil] Shortness Of Breath and Other (See Comments)    Peanut butter  . Latex Itching  . Metronidazole Other (See Comments)    Palpitations, mild SOB, metallic taste, dry mouth, high blood pressure  . Ciprofloxacin Other (See Comments)    Gaging and achy  . Lisinopril Cough  . Naproxen Nausea Only and Other (See Comments)    Headache  . Other Other (See Comments)    All pain meds make her itch - has to have something to prevent that in addition to receiving med  . Penicillins Other (See Comments)    Reaction unknown occurred during childhood but pt thinks she is no longer allergic Has patient had a PCN reaction causing immediate rash, facial/tongue/throat swelling, SOB or lightheadedness with hypotension: reaction unknown  Has patient had a PCN reaction causing severe rash involving mucus membranes or skin necrosis: reaction unknown Has patient had a PCN reaction that required hospitalization yes Has patient  had a PCN reaction occurring within the last 10 years: no childhood reaction If all o  . Prednisone Other (See Comments)    Heart beating fast       ROS: Pertinent items noted in HPI and remainder of comprehensive ROS otherwise negative.  HOME MEDS: Current Outpatient Prescriptions  Medication Sig Dispense Refill  . albuterol (PROVENTIL HFA;VENTOLIN HFA) 108 (90 BASE) MCG/ACT inhaler Inhale 2 puffs into the lungs every 6 (six) hours as needed for wheezing or shortness of breath. 1 Inhaler 2  . aspirin 81 MG chewable tablet Chew 81 mg by mouth daily.    Marland Kitchen  carbamide peroxide (DEBROX) 6.5 % otic solution Place 5 drops into the right ear 2 (two) times daily. (Patient taking differently: Place 5 drops into the right ear 2 (two) times daily as needed (For earwax removal.). ) 15 mL 2  . cycloSPORINE (RESTASIS) 0.05 % ophthalmic emulsion Place 1 drop into both eyes 2 (two) times daily.    . diclofenac sodium (VOLTAREN) 1 % GEL Apply 2 g topically 4 (four) times daily as needed (For pain.).    Marland Kitchen diphenhydrAMINE (BENADRYL) 2 % cream Apply 1 application topically 3 (three) times daily as needed for itching (Apply under breasts.).    Marland Kitchen esomeprazole (NEXIUM) 40 MG capsule TAKE ONE CAPSULE BY MOUTH EVERY DAY 30 capsule 3  . ferrous sulfate 325 (65 FE) MG tablet Take 3 times a day only on Mondays, Wednesdays, and Fridays (Patient taking differently: Take 325 mg by mouth every Monday, Wednesday, and Friday. ) 90 tablet 3  . ipratropium (ATROVENT) 0.06 % nasal spray Place 2 sprays into both nostrils 4 (four) times daily. (Patient taking differently: Place 2 sprays into both nostrils 4 (four) times daily as needed for rhinitis. ) 15 mL 1  . irbesartan (AVAPRO) 150 MG tablet Take 1 tablet (150 mg total) by mouth daily. 30 tablet 11  . meloxicam (MOBIC) 15 MG tablet Take 15 mg by mouth daily as needed for pain.   2  . metoprolol tartrate (LOPRESSOR) 25 MG tablet Take 1.5 tablets (37.5 mg total) by mouth 2  (two) times daily. 270 tablet 1  . Multiple Vitamin (MULTIVITAMIN WITH MINERALS) TABS tablet Take 1 tablet by mouth every Monday, Wednesday, and Friday.    . rosuvastatin (CRESTOR) 20 MG tablet TAKE 1 TABLET BY MOUTH EVERY DAY 30 tablet 11  . furosemide (LASIX) 20 MG tablet Take 1 tablet (20 mg total) by mouth daily as needed for edema. 30 tablet 3   No current facility-administered medications for this visit.     LABS/IMAGING: No results found for this or any previous visit (from the past 48 hour(s)). No results found.  VITALS: BP 138/86   Pulse 68   Ht 5\' 8"  (1.727 m)   Wt (!) 334 lb 6.4 oz (151.7 kg)   LMP 01/13/2014 Comment: spotting in Feb and bleeding since April  BMI 50.85 kg/m   EXAM: General appearance: alert and no distress Neck: no carotid bruit and no JVD Lungs: clear to auscultation bilaterally Heart: regular rate and rhythm, S1, S2 normal, no murmur, click, rub or gallop Abdomen: soft, non-tender; bowel sounds normal; no masses,  no organomegaly and morbidly obese Extremities: extremities normal, atraumatic, no cyanosis or edema Pulses: 2+ and symmetric Skin: Keloid scar of the midsternal incision site with a dilated varicose vein at the distal aspect Neurologic: Grossly normal  EKG: Normal sinus rhythm at 68  ASSESSMENT: 1. Coronary artery disease status post 5 vessel CABG (2015) 2. Morbid obesity 3. Hypertension - uncontrolled 4. Dyslipidemia 5. OSA, not on cPAP 6. Keloid scar - s/p breast reduction, possible upcoming surgery for scar revision 7. Palpitations 8. Leg edema  PLAN: 1.   Ms. Griffin has reasonable blood pressure control at this time. Her keloid scar was revised and is improving. She does have leg edema which is likely secondary to venous insufficiency. I'll provide some Lasix to use as needed for swelling. I've advised her to wear compression stockings at least on the left leg and possibly on the right leg and elevate her feet as much as  possible. Blood pressure appears to be at goal today. She needs to continue to work on exercise and weight loss. Follow-up with me annually or sooner as necessary.  [name], MD, [facility] Attending Cardiologist [place] C Tyrah Broers 07/01/2016, 10:06 AM

## 2016-07-08 DIAGNOSIS — L905 Scar conditions and fibrosis of skin: Secondary | ICD-10-CM | POA: Diagnosis not present

## 2016-07-08 DIAGNOSIS — Z9013 Acquired absence of bilateral breasts and nipples: Secondary | ICD-10-CM | POA: Diagnosis not present

## 2016-07-11 ENCOUNTER — Other Ambulatory Visit: Payer: Self-pay | Admitting: Internal Medicine

## 2016-07-12 NOTE — Telephone Encounter (Signed)
Rx request sent to pharmacy.  

## 2016-07-14 ENCOUNTER — Encounter (HOSPITAL_COMMUNITY): Payer: Self-pay | Admitting: Emergency Medicine

## 2016-07-14 ENCOUNTER — Emergency Department (HOSPITAL_COMMUNITY)
Admission: EM | Admit: 2016-07-14 | Discharge: 2016-07-14 | Disposition: A | Discharge status: Home / Self Care | Payer: Medicare Other | Attending: Emergency Medicine | Admitting: Emergency Medicine

## 2016-07-14 DIAGNOSIS — Z91010 Allergy to peanuts: Secondary | ICD-10-CM | POA: Insufficient documentation

## 2016-07-14 DIAGNOSIS — I252 Old myocardial infarction: Secondary | ICD-10-CM | POA: Diagnosis not present

## 2016-07-14 DIAGNOSIS — H109 Unspecified conjunctivitis: Secondary | ICD-10-CM | POA: Diagnosis not present

## 2016-07-14 DIAGNOSIS — Z91040 Latex allergy status: Secondary | ICD-10-CM | POA: Diagnosis not present

## 2016-07-14 DIAGNOSIS — Z8542 Personal history of malignant neoplasm of other parts of uterus: Secondary | ICD-10-CM | POA: Insufficient documentation

## 2016-07-14 DIAGNOSIS — Z7982 Long term (current) use of aspirin: Secondary | ICD-10-CM | POA: Diagnosis not present

## 2016-07-14 DIAGNOSIS — J45909 Unspecified asthma, uncomplicated: Secondary | ICD-10-CM | POA: Insufficient documentation

## 2016-07-14 DIAGNOSIS — H5711 Ocular pain, right eye: Secondary | ICD-10-CM | POA: Diagnosis present

## 2016-07-14 DIAGNOSIS — Z96652 Presence of left artificial knee joint: Secondary | ICD-10-CM | POA: Diagnosis not present

## 2016-07-14 DIAGNOSIS — I10 Essential (primary) hypertension: Secondary | ICD-10-CM | POA: Diagnosis not present

## 2016-07-14 DIAGNOSIS — I2510 Atherosclerotic heart disease of native coronary artery without angina pectoris: Secondary | ICD-10-CM | POA: Insufficient documentation

## 2016-07-14 DIAGNOSIS — Z951 Presence of aortocoronary bypass graft: Secondary | ICD-10-CM | POA: Diagnosis not present

## 2016-07-14 MED ORDER — ERYTHROMYCIN 5 MG/GM OP OINT: TOPICAL_OINTMENT | OPHTHALMIC | 0 refills | Status: DC

## 2016-07-14 MED ORDER — FLUORESCEIN SODIUM 1 MG OP STRP
1.0000 | ORAL_STRIP | Freq: Once | OPHTHALMIC | Status: AC
  Administered 2016-07-14: 1 via OPHTHALMIC
  Filled 2016-07-14: qty 1

## 2016-07-14 MED ORDER — TETRACAINE HCL 0.5 % OP SOLN
2.0000 [drp] | Freq: Once | OPHTHALMIC | Status: AC
  Administered 2016-07-14: 2 [drp] via OPHTHALMIC
  Filled 2016-07-14: qty 2

## 2016-07-14 NOTE — ED Triage Notes (Signed)
Pt. reports right eye redness , pain/sore and clear drainage onset yesterday , denies injury or blurred vision .

## 2016-07-14 NOTE — Discharge Instructions (Signed)
Apply ointment to eye twice daily. Follow up with your ophthalmologist for re-evaluation. Return to the ED if you experience severe worsening of your symptoms, increased eye pain, swelling, fevers, chills or decreased vision.

## 2016-07-14 NOTE — ED Provider Notes (Signed)
[facility] DEPT Provider Note   CSN: [id number] Arrival date & time: 07/14/16  0223     History   Chief Complaint Chief Complaint  Patient presents with  . Conjunctivitis    HPI [name] is a 60 y.o. female with a pmhx of HTN, HLD, CAD s/p CABG who presents to the ED today c/o right eye pain. Pt states that yesterday she noticed that her right eye was beginning to hurt. Pt states that eye began turning red and producing watery discharge. Pt states that she was around her grandchildren yesterday. Pain is not increased with eye movement. No vision changes. Pt does not wear contacts. Pt has associated mild R sided headache. No fevers or chills.   HPI  Past Medical History:  Diagnosis Date  . Acute urinary retention s/p Foley 01/07/2012  . Anemia   . Anginal pain ([place])   . Asthma    History of  . Blood dyscrasia    ABNORMAL VAGINAL BLEEDING  . Carpal tunnel syndrome, bilateral   . Chronic headaches   . Coronary artery disease   . Cyst of eye    "behind eye"  . Dyslipidemia   . Dysrhythmia    occ palpitations   . GERD (gastroesophageal reflux disease)   . Headache(784.0)    occasional   . Heart murmur   . Hemorrhoids, internal, with bleeding & prolapse 12/13/2011  . Hernia, abdominal   . History of kidney stones   . Hypertension   . Iron deficiency 12-07-2011  . Morbid obesity ([place])   . Myocardial infarction [facility])    unsure when; per cardiologist report  . Neuropathy ([place])   . Osteoarthritis   . Osteoporosis   . PONV (postoperative nausea and vomiting)   . Postmenopausal bleeding   . S/P CABG (coronary artery bypass graft)    x5 - LIMA to LAD, SVG to diagonal, SVG to OM1, SVG to OM2, SVG to PDA (Dr. Prescott Gum)  . Seasonal allergies   . Sleep apnea    no CPAP machine; sleep study 02/2010 REM AHI 61.7/hr, total sleep REM 14.8/hr  . Uterine cancer ([place]) 7/15   clinical stage IA grade 1 endometrioid endometrial cancer    Patient Active Problem List   Diagnosis Date Noted  . Achilles tendinitis 01/26/2016  . Atherosclerosis of aorta ([place]) 06/20/2015  . Osteoarthritis of both knees 11/07/2014  . Health care maintenance 10/09/2014  . Keloid skin disorder 07/26/2014  . Endometrial cancer, grade I ([place]) 06/24/2014  . S/P CABG x 5 01/09/2014  . Coronary atherosclerosis of native coronary artery 01/05/2014  . Morbid obesity due to excess calories ([place]) 05/10/2012  . Iron deficiency anemia 12/08/2011  . Obstructive sleep apnea 03/27/2010  . GERD 08/06/2008  . Hyperlipidemia 10/31/2007  . Essential hypertension 06/30/2007    Past Surgical History:  Procedure Laterality Date  . ABDOMINAL HYSTERECTOMY    . BREAST REDUCTION SURGERY Bilateral 10/10/2015   Procedure: BILATERAL MAMMARY REDUCTION  (BREAST) WITH FREE NIPPLE GRAFT;  Surgeon: Irene Limbo, MD;  Location: [facility];  Service: Plastics;  Laterality: Bilateral;  . BREAST SURGERY     reduction  . CARDIAC CATHETERIZATION    . COLONOSCOPY  2009  . COLONOSCOPY W/ BIOPSIES AND POLYPECTOMY    . CORONARY ARTERY BYPASS GRAFT N/A 01/09/2014   Procedure: CORONARY ARTERY BYPASS GRAFTING (CABG) x 5 using left internal mammary artery and right leg greater saphenous vein harvested endoscopically;  Surgeon: Ivin Poot, MD;  Location: [facility];  Service: Open Heart Surgery;  Laterality: N/A;  please use bed extenders and breast binder  . INTRAOPERATIVE TRANSESOPHAGEAL ECHOCARDIOGRAM N/A 01/09/2014   Procedure: INTRAOPERATIVE TRANSESOPHAGEAL ECHOCARDIOGRAM;  Surgeon: Ivin Poot, MD;  Location: [facility];  Service: Open Heart Surgery;  Laterality: N/A;  . KNEE ARTHROSCOPY  1991  . KNEE SURGERY Bilateral 1999  . LEFT HEART CATHETERIZATION WITH CORONARY ANGIOGRAM N/A 01/04/2014   Procedure: LEFT HEART CATHETERIZATION WITH CORONARY ANGIOGRAM;  Surgeon: Sinclair Grooms, MD;  Location: [facility] CATH LAB;  Service: Cardiovascular;  Laterality: N/A;  . LESION EXCISION WITH COMPLEX REPAIR N/A 05/07/2016    Procedure: COMPLEX REPAIR OF CHEST 16 CM;  Surgeon: Irene Limbo, MD;  Location: [facility];  Service: Plastics;  Laterality: N/A;  . MULTIPLE EXTRACTIONS WITH ALVEOLOPLASTY N/A 04/11/2014   Procedure: Extraction of tooth #'s 1,2,8,16 with alveoloplasty, maxillary tuberosity reductions, and gross debridement of remaining teeth.;  Surgeon: Lenn Cal, DDS;  Location: [facility];  Service: Oral Surgery;  Laterality: N/A;  . MULTIPLE TOOTH EXTRACTIONS    . NM MYOCAR PERF WALL MOTION  01/2010   dipyridamole myoview - moderate perfusion defect in basal inferoseptal, basal inferior, mid inferoseptal, mid inferior, apical inferior region; EF 56%  . TEE WITHOUT CARDIOVERSION  01/2010   EF 60-65%, small, flat, non-infiltrating, calcified, fixed apical/septal mass  . TOTAL KNEE ARTHROPLASTY Left 04/29/2011  . transanal hemorrhoidal dearterliaization  01/06/12   with external hemorrhoid removal  . UNILATERAL SALPINGECTOMY Right 06/03/2014   Procedure: UNILATERAL SALPINGECTOMY;  Surgeon: Lavonia Drafts, MD;  Location: [facility];  Service: Gynecology;  Laterality: Right;  Marland Kitchen VAGINAL HYSTERECTOMY N/A 06/03/2014   Procedure: HYSTERECTOMY VAGINAL;  Surgeon: Lavonia Drafts, MD;  Location: [facility];  Service: Gynecology;  Laterality: N/A;  . WISDOM TOOTH EXTRACTION      OB History    Gravida Para Term Preterm AB Living   2 2 2  0 0 2   SAB TAB Ectopic Multiple Live Births   0 0 0 0         Home Medications    Prior to Admission medications   Medication Sig Start Date End Date Taking? Authorizing Provider  albuterol (PROVENTIL HFA;VENTOLIN HFA) 108 (90 BASE) MCG/ACT inhaler Inhale 2 puffs into the lungs every 6 (six) hours as needed for wheezing or shortness of breath. 03/30/15   Gregor Hams, MD  aspirin 81 MG chewable tablet Chew 81 mg by mouth daily.    Historical Provider, MD  carbamide peroxide (DEBROX) 6.5 % otic solution Place 5 drops into the right ear 2 (two) times daily. Patient taking  differently: Place 5 drops into the right ear 2 (two) times daily as needed (For earwax removal.).  01/26/16 01/25/17  Axel Filler, MD  cycloSPORINE (RESTASIS) 0.05 % ophthalmic emulsion Place 1 drop into both eyes 2 (two) times daily.    Historical Provider, MD  diclofenac sodium (VOLTAREN) 1 % GEL Apply 2 g topically 4 (four) times daily as needed (For pain.).    Historical Provider, MD  diphenhydrAMINE (BENADRYL) 2 % cream Apply 1 application topically 3 (three) times daily as needed for itching (Apply under breasts.).    Historical Provider, MD  esomeprazole (NEXIUM) 40 MG capsule TAKE ONE CAPSULE BY MOUTH EVERY DAY 06/14/16   Axel Filler, MD  ferrous sulfate 325 (65 FE) MG tablet Take 3 times a day only on Mondays, Wednesdays, and Fridays Patient taking differently: Take 325 mg by mouth every Monday, Wednesday, and Friday.  11/05/15   Loleta Chance, MD  furosemide (LASIX) 20 MG tablet Take 1 tablet (20 mg total) by mouth daily as needed for edema. 07/01/16 09/29/16  [name], MD  ipratropium (ATROVENT) 0.06 % nasal spray Place 2 sprays into both nostrils 4 (four) times daily. Patient taking differently: Place 2 sprays into both nostrils 4 (four) times daily as needed for rhinitis.  03/30/15   Gregor Hams, MD  irbesartan (AVAPRO) 150 MG tablet TAKE 1 TABLET BY MOUTH EVERY DAY 07/12/16   [name], MD  meloxicam (MOBIC) 15 MG tablet Take 15 mg by mouth daily as needed for pain.  04/01/16   Historical Provider, MD  metoprolol tartrate (LOPRESSOR) 25 MG tablet Take 1.5 tablets (37.5 mg total) by mouth 2 (two) times daily. 04/07/16   [name], MD  Multiple Vitamin (MULTIVITAMIN WITH MINERALS) TABS tablet Take 1 tablet by mouth every Monday, Wednesday, and Friday.    Historical Provider, MD  rosuvastatin (CRESTOR) 20 MG tablet TAKE 1 TABLET BY MOUTH EVERY DAY 01/02/16   [name], MD    Family History Family History  Problem Relation Age of Onset  . Heart attack  Mother 34  . Heart disease Mother   . Hypertension Mother   . Diabetes Mother   . Hypertension Sister   . Heart failure Sister   . Heart disease Sister   . Diabetes type II Sister   . Kidney disease Brother     hypertension  . Hypertension Child   . Colon cancer Neg Hx     Social History Social History  Substance Use Topics  . Smoking status: Never Smoker  . Smokeless tobacco: Never Used  . Alcohol use No     Allergies   Cephalosporins; Eicosapentaenoic acid (epa); Fish-derived products; Peanuts [peanut oil]; Latex; Metronidazole; Ciprofloxacin; Lisinopril; Naproxen; Other; Penicillins; and Prednisone   Review of Systems Review of Systems  All other systems reviewed and are negative.    Physical Exam Updated Vital Signs BP (!) 139/120 (BP Location: Right Arm)   Pulse 78   Temp 98.3 F (36.8 C) (Oral)   Resp 22   LMP 01/13/2014 Comment: spotting in Feb and bleeding since April  SpO2 99%   Physical Exam  Constitutional: She is oriented to person, place, and time. She appears well-developed and well-nourished. No distress.  HENT:  Head: Normocephalic and atraumatic.  Eyes: EOM are normal. Pupils are equal, round, and reactive to light. Lids are everted and swept, no foreign bodies found. Right eye exhibits discharge ( clear). Left eye exhibits no discharge. Right conjunctiva is injected. No scleral icterus.  Slit lamp exam:      The right eye shows no corneal abrasion, no corneal flare, no corneal ulcer, no foreign body, no hyphema, no fluorescein uptake and no anterior chamber bulge.  R distance visual acuity: 20/70 (baseline per pt) L distance visual acuity: 20/30  IOP R: 18 mmHg IOP L: 17 mmHg  Cardiovascular: Normal rate.   Pulmonary/Chest: Effort normal.  Neurological: She is alert and oriented to person, place, and time. Coordination normal.  Skin: Skin is warm and dry. No rash noted. She is not diaphoretic. No erythema. No pallor.  Psychiatric: She has a  normal mood and affect. Her behavior is normal.  Nursing note and vitals reviewed.    ED Treatments / Results  Labs (all labs ordered are listed, but only abnormal results are displayed) Labs Reviewed - No data to display  EKG  EKG Interpretation  Date/Time:  Wednesday July 14 2016 02:27:38 EDT Ventricular Rate:  87 PR Interval:  180 QRS Duration: 82 QT Interval:  332 QTC Calculation: 399 R Axis:   19 Text Interpretation:  Normal sinus rhythm Possible Anterior infarct , age undetermined Abnormal ECG No significant change since last tracing Confirmed by WARD,  DO, KRISTEN ([id number]) on 07/14/2016 5:53:01 AM       Radiology No results found.  Procedures Procedures (including critical care time)  Medications Ordered in ED Medications  fluorescein ophthalmic strip 1 strip (not administered)  tetracaine (PONTOCAINE) 0.5 % ophthalmic solution 2 drop (not administered)     Initial Impression / Assessment and Plan / ED Course  I have reviewed the triage vital signs and the nursing notes.  Pertinent labs & imaging results that were available during my care of the patient were reviewed by me and considered in my medical decision making (see chart for details).  Clinical Course    Patient presentation consistent with conjunctivitis.  No purulent discharge, corneal abrasions, entrapment, consensual photophobia, or dendritic staining with fluorescein study.  Presentation non-concerning for iritis, bacterial conjunctivitis, corneal abrasions, or HSV. IOP in R eye is 19mmHG, L eye 65mmHG. Do not suspect acute angle glaucoma.  Pt will be d/c with erythromycin ointment.  Personal hygiene and frequent handwashing discussed.  Patient advised to followup with ophthalmologist if symptoms persist or worsen in any way including vision change or purulent discharge.  Patient verbalizes understanding and is agreeable with discharge.   Final Clinical Impressions(s) / ED Diagnoses   Final  diagnoses:  Conjunctivitis of right eye    New Prescriptions New Prescriptions   No medications on file     Carlos Levering, PA-C 07/14/16 [place], DO 07/15/16 [phone]

## 2016-07-28 ENCOUNTER — Ambulatory Visit: Payer: Self-pay | Admitting: Neurology

## 2016-07-28 NOTE — Progress Notes (Deleted)
Follow-up Visit   Date: 07/28/16    LEXIE Griffin MRN: [id number] DOB: November 25, 1955   Interim History: [name] is a 60 y.o. right-handed African American female with hypertension, CAD s/p CABG (February 2015), obstructive sleep apnea, uterine cancer s/p hysterectomy (05/2014) and degenerative arthritis returning to the clinic for follow-up of migraine equivalent syndrome manifesting with left sided paresthesias.  The patient was accompanied to the clinic by *** who also provides collateral information.    History of present illness: Initial visit 12/06/2014:  She was admitted to [facility] from 11/7-11/06/2014 where she presented with 3-day history of migratory left sided numbness affecting her left upper extremity, left lower extremity, and right lower extremity. She underwent CABG in February and was concerned symptoms were cardiac related, so when to the hospital.  She had MRI brain and CT head which was unremarkable and she was told to see neurology for further evaluation.  She reports having long standing history of left sided numbness/tingling involving the face, arm and leg.  She occasionally will have associated headaches. Headaches started over the left parietal region, described as throbbing and sharp.  She stopped taking pain medication in November and noticed that her left sided paresthesias and headaches have resolved.  She has a low-grade dull headache which is too mild to take any medication.  She has long history of left leg numbness following left total knee replacement and has been using a cane for ambulation since then.   UPDATE ***:  Medications:  Current Outpatient Prescriptions on File Prior to Visit  Medication Sig Dispense Refill  . albuterol (PROVENTIL HFA;VENTOLIN HFA) 108 (90 BASE) MCG/ACT inhaler Inhale 2 puffs into the lungs every 6 (six) hours as needed for wheezing or shortness of breath. 1 Inhaler 2  . aspirin 81 MG chewable tablet Chew  81 mg by mouth daily.    . carbamide peroxide (DEBROX) 6.5 % otic solution Place 5 drops into the right ear 2 (two) times daily. (Patient taking differently: Place 5 drops into the right ear 2 (two) times daily as needed (For earwax removal.). ) 15 mL 2  . cycloSPORINE (RESTASIS) 0.05 % ophthalmic emulsion Place 1 drop into both eyes 2 (two) times daily.    . diclofenac sodium (VOLTAREN) 1 % GEL Apply 2 g topically 4 (four) times daily as needed (For pain.).    Marland Kitchen diphenhydrAMINE (BENADRYL) 2 % cream Apply 1 application topically 3 (three) times daily as needed for itching (Apply under breasts.).    Marland Kitchen erythromycin ophthalmic ointment Place a 1/2 inch ribbon of ointment into the lower eyelid twice daily x 10 days 1 g 0  . esomeprazole (NEXIUM) 40 MG capsule TAKE ONE CAPSULE BY MOUTH EVERY DAY 30 capsule 3  . ferrous sulfate 325 (65 FE) MG tablet Take 3 times a day only on Mondays, Wednesdays, and Fridays (Patient taking differently: Take 325 mg by mouth every Monday, Wednesday, and Friday. ) 90 tablet 3  . furosemide (LASIX) 20 MG tablet Take 1 tablet (20 mg total) by mouth daily as needed for edema. 30 tablet 3  . ipratropium (ATROVENT) 0.06 % nasal spray Place 2 sprays into both nostrils 4 (four) times daily. (Patient taking differently: Place 2 sprays into both nostrils 4 (four) times daily as needed for rhinitis. ) 15 mL 1  . irbesartan (AVAPRO) 150 MG tablet TAKE 1 TABLET BY MOUTH EVERY DAY 30 tablet 11  . meloxicam (MOBIC) 15 MG tablet Take 15 mg  by mouth daily as needed for pain.   2  . metoprolol tartrate (LOPRESSOR) 25 MG tablet Take 1.5 tablets (37.5 mg total) by mouth 2 (two) times daily. 270 tablet 1  . Multiple Vitamin (MULTIVITAMIN WITH MINERALS) TABS tablet Take 1 tablet by mouth every Monday, Wednesday, and Friday.    . rosuvastatin (CRESTOR) 20 MG tablet TAKE 1 TABLET BY MOUTH EVERY DAY 30 tablet 11   No current facility-administered medications on file prior to visit.      Allergies:  Allergies  Allergen Reactions  . Cephalosporins Anaphylaxis, Swelling and Other (See Comments)    Tongue swelling, gum pain  . Eicosapentaenoic Acid (Epa) Shortness Of Breath  . Fish-Derived Products Shortness Of Breath  . Peanuts [Peanut Oil] Shortness Of Breath and Other (See Comments)    Peanut butter  . Latex Itching  . Metronidazole Other (See Comments)    Palpitations, mild SOB, metallic taste, dry mouth, high blood pressure  . Ciprofloxacin Other (See Comments)    Gaging and achy  . Lisinopril Cough  . Naproxen Nausea Only and Other (See Comments)    Headache  . Other Other (See Comments)    All pain meds make her itch - has to have something to prevent that in addition to receiving med  . Penicillins Other (See Comments)    Reaction unknown occurred during childhood but pt thinks she is no longer allergic Has patient had a PCN reaction causing immediate rash, facial/tongue/throat swelling, SOB or lightheadedness with hypotension: reaction unknown  Has patient had a PCN reaction causing severe rash involving mucus membranes or skin necrosis: reaction unknown Has patient had a PCN reaction that required hospitalization yes Has patient had a PCN reaction occurring within the last 10 years: no childhood reaction If all o  . Prednisone Other (See Comments)    Heart beating fast       Review of Systems:  CONSTITUTIONAL: No fevers, chills, night sweats, or weight loss.  EYES: No visual changes or eye pain ENT: No hearing changes.  No history of nose bleeds.   RESPIRATORY: No cough, wheezing and shortness of breath.   CARDIOVASCULAR: Negative for chest pain, and palpitations.   GI: Negative for abdominal discomfort, blood in stools or black stools.  No recent change in bowel habits.   GU:  No history of incontinence.   MUSCLOSKELETAL: No history of joint pain or swelling.  No myalgias.   SKIN: Negative for lesions, rash, and itching.   ENDOCRINE: Negative  for cold or heat intolerance, polydipsia or goiter.   PSYCH:  *** depression or anxiety symptoms.   NEURO: As Above.   Vital Signs:  LMP 01/13/2014 Comment: spotting in Feb and bleeding since April Pain Scale: *** on a scale of 0-10   General: *** Neck: *** carotid bruit CV: *** Ext: ***  Neurological Exam: MENTAL STATUS including orientation to time, place, person, recent and remote memory, attention span and concentration, language, and fund of knowledge is ***normal.  Speech is not dysarthric.  CRANIAL NERVES: No visual field defects. *** Pupils equal round and reactive to light.  Normal conjugate, extra-ocular eye movements in all directions of gaze.  No ptosis ***. Normal facial sensation.  Face is symmetric. Palate elevates symmetrically.  Tongue is midline.  MOTOR:  Motor strength is 5/5 in all extremities, except ***.  No atrophy, fasciculations or abnormal movements.  No pronator drift.  Tone is normal.    MSRs:  Reflexes are 2+/4 throughout, except ***.  SENSORY:  Intact to ***.  COORDINATION/GAIT:  Normal finger-to- nose-finger and heel-to-shin.  Intact rapid alternating movements bilaterally.  Gait narrow based and stable.   Data:*** MRI brain wo contrast 09/29/2014: 1. No acute intracranial abnormality. 2. Stable mild white matter disease, advanced for age. The finding is nonspecific but can be seen in the setting of chronic microvascular ischemia, a demyelinating process such as multiple sclerosis, vasculitis, complicated migraine headaches, or as the sequelae of a prior infectious or inflammatory process.  CT head 09/28/2014:  Negative US carotids 01/06/2014:  1-39% stenosis bilaterally  Labs 09/29/2014:  HIV NR, vitamin B12 621, ferritin 6, HbA1c 5.1  IMPRESSION/PLAN***: *** 1.  Migraine equivalent syndrome manifesting with left sided paresthesias - Clinically, doing much better since stopping her pain medication suggesting she also had medication overuse  headaches *** - Currently headaches are too mild to start preventative therapy, so recommend as needed medication -  Counseled on importance of limiting rescue medications to twice per week  2.  Left leg numbness, persistent following knee surgery  - Follow clinically as patient not interested in EMG at this time  3.  Early distal and symmetric polyneuropathy, idiopathic  - No active complaints of paresthesias - HbA1c, TSH, and B12 within normal limits  4.  Morbid obesity  PLAN/RECOMMENDATIONS:  ***   The duration of this appointment visit was *** minutes of face-to-face time with the patient.  Greater than 50% of this time was spent in counseling, explanation of diagnosis, planning of further management, and coordination of care.   Thank you for allowing me to participate in patient's care.  If I can answer any additional questions, I would be pleased to do so.    Sincerely,    Donika K. Posey Pronto, DO

## 2016-08-04 DIAGNOSIS — Z951 Presence of aortocoronary bypass graft: Secondary | ICD-10-CM | POA: Diagnosis not present

## 2016-08-04 DIAGNOSIS — Z9013 Acquired absence of bilateral breasts and nipples: Secondary | ICD-10-CM | POA: Diagnosis not present

## 2016-08-04 DIAGNOSIS — L910 Hypertrophic scar: Secondary | ICD-10-CM | POA: Diagnosis not present

## 2016-08-04 DIAGNOSIS — I25810 Atherosclerosis of coronary artery bypass graft(s) without angina pectoris: Secondary | ICD-10-CM | POA: Diagnosis not present

## 2016-08-04 DIAGNOSIS — L905 Scar conditions and fibrosis of skin: Secondary | ICD-10-CM | POA: Diagnosis not present

## 2016-08-11 DIAGNOSIS — Z9013 Acquired absence of bilateral breasts and nipples: Secondary | ICD-10-CM | POA: Diagnosis not present

## 2016-08-11 DIAGNOSIS — L905 Scar conditions and fibrosis of skin: Secondary | ICD-10-CM | POA: Diagnosis not present

## 2016-08-13 ENCOUNTER — Other Ambulatory Visit: Payer: Self-pay | Admitting: Pharmacist

## 2016-08-13 NOTE — Patient Outreach (Signed)
Outreach call to [name] regarding her request for follow up from the [facility] Medication Adherence Campaign. Left a HIPAA compliant message on the patient's voicemail.  Harlow Asa, PharmD Clinical Pharmacist [facility] [phone]

## 2016-08-24 DIAGNOSIS — H25013 Cortical age-related cataract, bilateral: Secondary | ICD-10-CM | POA: Diagnosis not present

## 2016-08-24 DIAGNOSIS — H40003 Preglaucoma, unspecified, bilateral: Secondary | ICD-10-CM | POA: Diagnosis not present

## 2016-08-24 DIAGNOSIS — H35371 Puckering of macula, right eye: Secondary | ICD-10-CM | POA: Diagnosis not present

## 2016-08-24 DIAGNOSIS — H40023 Open angle with borderline findings, high risk, bilateral: Secondary | ICD-10-CM | POA: Diagnosis not present

## 2016-08-24 DIAGNOSIS — H2513 Age-related nuclear cataract, bilateral: Secondary | ICD-10-CM | POA: Diagnosis not present

## 2016-08-30 ENCOUNTER — Encounter: Payer: Self-pay | Admitting: Student in an Organized Health Care Education/Training Program

## 2016-09-07 ENCOUNTER — Other Ambulatory Visit: Payer: Self-pay | Admitting: Student in an Organized Health Care Education/Training Program

## 2016-09-20 ENCOUNTER — Encounter: Payer: Self-pay | Admitting: Student in an Organized Health Care Education/Training Program

## 2016-09-20 ENCOUNTER — Ambulatory Visit (INDEPENDENT_AMBULATORY_CARE_PROVIDER_SITE_OTHER): Payer: Medicare Other | Admitting: Student in an Organized Health Care Education/Training Program

## 2016-09-20 VITALS — BP 139/62 | HR 50 | Temp 97.7°F | Ht 68.0 in | Wt 331.3 lb

## 2016-09-20 DIAGNOSIS — G4733 Obstructive sleep apnea (adult) (pediatric): Secondary | ICD-10-CM | POA: Diagnosis not present

## 2016-09-20 DIAGNOSIS — I10 Essential (primary) hypertension: Secondary | ICD-10-CM | POA: Diagnosis not present

## 2016-09-20 DIAGNOSIS — Z23 Encounter for immunization: Secondary | ICD-10-CM | POA: Diagnosis not present

## 2016-09-20 DIAGNOSIS — M170 Bilateral primary osteoarthritis of knee: Secondary | ICD-10-CM | POA: Diagnosis not present

## 2016-09-20 DIAGNOSIS — I2510 Atherosclerotic heart disease of native coronary artery without angina pectoris: Secondary | ICD-10-CM | POA: Diagnosis not present

## 2016-09-20 DIAGNOSIS — E7800 Pure hypercholesterolemia, unspecified: Secondary | ICD-10-CM

## 2016-09-20 DIAGNOSIS — Z7982 Long term (current) use of aspirin: Secondary | ICD-10-CM

## 2016-09-20 DIAGNOSIS — Z79899 Other long term (current) drug therapy: Secondary | ICD-10-CM

## 2016-09-20 DIAGNOSIS — K219 Gastro-esophageal reflux disease without esophagitis: Secondary | ICD-10-CM

## 2016-09-20 NOTE — Progress Notes (Signed)
Assessment and Plan:  See Encounters tab for problem-based medical decision making.   __________________________________________________________  HPI:  60 year old woman presented for follow-up of hypertension. Since I last saw her she had a plastic surgery to revise her sternum scar from her five-vessel CABG many years ago. Surgery went well and she now has a well-healed scar. Infertility she is having a lot of tenderness over her chest around where the surgery was. She says her hard for her to wear shirts because of tenderness to light touch. Denies any chest pressure, no chest pain with exertion. Good compliance with her medications at home without adverse side effects. No recent falls. She lives by herself but has a lot of family in [place]. She is accompanied by a granddaughter today. As a holiday she likes to make craft items, she shops by herself, drives independently. Is independent in all her other activities of daily living.  __________________________________________________________  Problem List: Patient Active Problem List   Diagnosis Date Noted  . Atherosclerosis of aorta ([place]) 06/20/2015    Priority: High  . Coronary atherosclerosis of native coronary artery 01/05/2014    Priority: High  . Endometrial cancer, grade I ([place]) 06/24/2014    Priority: Medium  . Morbid obesity due to excess calories ([place]) 05/10/2012    Priority: Medium  . Obstructive sleep apnea 03/27/2010    Priority: Medium  . Essential hypertension 06/30/2007    Priority: Medium  . Osteoarthritis of both knees 11/07/2014    Priority: Low  . Health care maintenance 10/09/2014    Priority: Low  . GERD 08/06/2008    Priority: Low  . Hyperlipidemia 10/31/2007    Priority: Low    Medications: Reconciled today in Epic __________________________________________________________  Physical Exam:  Vital Signs: Vitals:   09/20/16 1059  BP: 139/62  Pulse: (!) 50  Temp: 97.7 F (36.5 C)  TempSrc: Oral   SpO2: 100%  Weight: (!) 331 lb 4.8 oz (150.3 kg)  Height: 5\' 8"  (1.727 m)    Gen: Well appearing, NAD ENT: OP clear without erythema or exudate, several missing upper teeth.  Neck: No cervical LAD, No JVD. CV: RRR, no murmurs Pulm: Normal effort, CTA throughout, no wheezing Abd: Soft, NT, ND, normal BS. Obese, no fluid wave. Ext: Warm, 1+ non-pitting edema Skin: No atypical appearing moles. No rashes, Midline sternum scar is well healed with minimal keloid formation.

## 2016-09-20 NOTE — Assessment & Plan Note (Signed)
Symptomatically stable, uses a cane for ambulation. I wonder if she would benefit from a walker in the future. She will let me know and I can order her a rolling walker. We should also consider PT evaluation and treatment in the future. I think she is going to remain high risk for falls.

## 2016-09-20 NOTE — Assessment & Plan Note (Signed)
Blood pressure is well controlled today. Plan to continue irbesartan and metoprolol. Renal function was normal in June.

## 2016-09-20 NOTE — Assessment & Plan Note (Signed)
Sleep apnea diagnosed in 2011 but never initiated on CPAP. We try to get a sleep study earlier this year but she has not yet been able to schedule. She got a call sleep study center today and schedule for sometime over the next few weeks. Hopefully they can titrate CPAP at that visit and I'll order through DME.

## 2016-09-20 NOTE — Assessment & Plan Note (Signed)
Doing well with no angina symptoms. She is feeling like she's having some swelling in her abdomen. I advised her to take Lasix 20 mg once daily for the next week and monitor weights at home. Otherwise continue aspirin, rosuvastatin, metoprolol, and irbesartan.

## 2016-09-20 NOTE — Patient Instructions (Signed)
1. Take Lasix 20mg  once a day for a week to see if that helps your swelling.   2. Continue all your medications as normal.

## 2016-09-28 ENCOUNTER — Encounter: Payer: Self-pay | Admitting: Gastroenterology

## 2016-10-25 DIAGNOSIS — H53041 Amblyopia suspect, right eye: Secondary | ICD-10-CM | POA: Diagnosis not present

## 2016-10-25 DIAGNOSIS — H25813 Combined forms of age-related cataract, bilateral: Secondary | ICD-10-CM | POA: Diagnosis not present

## 2016-10-25 DIAGNOSIS — H53001 Unspecified amblyopia, right eye: Secondary | ICD-10-CM | POA: Diagnosis not present

## 2016-10-25 DIAGNOSIS — H43821 Vitreomacular adhesion, right eye: Secondary | ICD-10-CM | POA: Diagnosis not present

## 2016-11-05 ENCOUNTER — Other Ambulatory Visit: Payer: Self-pay | Admitting: Internal Medicine

## 2016-11-10 ENCOUNTER — Other Ambulatory Visit: Payer: Self-pay | Admitting: Internal Medicine

## 2016-12-01 DIAGNOSIS — H04123 Dry eye syndrome of bilateral lacrimal glands: Secondary | ICD-10-CM | POA: Diagnosis not present

## 2016-12-01 DIAGNOSIS — H11423 Conjunctival edema, bilateral: Secondary | ICD-10-CM | POA: Diagnosis not present

## 2016-12-09 ENCOUNTER — Other Ambulatory Visit: Payer: Self-pay | Admitting: Internal Medicine

## 2016-12-10 NOTE — Telephone Encounter (Signed)
Rx has been sent to the pharmacy electronically. ° °

## 2016-12-11 ENCOUNTER — Other Ambulatory Visit: Payer: Self-pay | Admitting: Student in an Organized Health Care Education/Training Program

## 2016-12-20 ENCOUNTER — Telehealth: Payer: Self-pay | Admitting: Student in an Organized Health Care Education/Training Program

## 2016-12-20 ENCOUNTER — Ambulatory Visit: Payer: Self-pay

## 2016-12-20 NOTE — Telephone Encounter (Signed)
APT. REMINDER CALL, LMTCB °

## 2016-12-21 ENCOUNTER — Encounter: Payer: Self-pay | Admitting: Internal Medicine

## 2016-12-21 ENCOUNTER — Ambulatory Visit (INDEPENDENT_AMBULATORY_CARE_PROVIDER_SITE_OTHER): Payer: Medicare Other | Admitting: Internal Medicine

## 2016-12-21 DIAGNOSIS — N3001 Acute cystitis with hematuria: Secondary | ICD-10-CM | POA: Diagnosis not present

## 2016-12-21 DIAGNOSIS — R319 Hematuria, unspecified: Secondary | ICD-10-CM | POA: Diagnosis not present

## 2016-12-21 DIAGNOSIS — B9689 Other specified bacterial agents as the cause of diseases classified elsewhere: Secondary | ICD-10-CM

## 2016-12-21 DIAGNOSIS — N390 Urinary tract infection, site not specified: Secondary | ICD-10-CM | POA: Diagnosis not present

## 2016-12-21 LAB — POCT URINALYSIS DIPSTICK
BILIRUBIN UA: NEGATIVE
Glucose, UA: NEGATIVE
KETONES UA: NEGATIVE
LEUKOCYTES UA: NEGATIVE
Nitrite, UA: NEGATIVE
PH UA: 6
Protein, UA: NEGATIVE
SPEC GRAV UA: 1.02
Urobilinogen, UA: 0.2

## 2016-12-21 MED ORDER — NITROFURANTOIN MONOHYD MACRO 100 MG PO CAPS: 100.0000 mg | ORAL_CAPSULE | Freq: Two times a day (BID) | ORAL | 0 refills | Status: AC

## 2016-12-21 NOTE — Progress Notes (Signed)
CC: UTI HPI: [name] is a 61 y.o. female with a h/o of HTN, CAD who presents for concern of UTI.  Please see Problem-based charting for HPI and the status of patient's chronic medical conditions.  Past Medical History:  Diagnosis Date  . Acute urinary retention s/p Foley 01/07/2012  . Anemia   . Anginal pain ([place])   . Asthma    History of  . Blood dyscrasia    ABNORMAL VAGINAL BLEEDING  . Carpal tunnel syndrome, bilateral   . Chronic headaches   . Coronary artery disease   . Cyst of eye    "behind eye"  . Dyslipidemia   . Dysrhythmia    occ palpitations   . GERD (gastroesophageal reflux disease)   . Headache(784.0)    occasional   . Heart murmur   . Hemorrhoids, internal, with bleeding & prolapse 12/13/2011  . Hernia, abdominal   . History of kidney stones   . Hypertension   . Iron deficiency 12-07-2011  . Morbid obesity ([place])   . Myocardial infarction    unsure when; per cardiologist report  . Neuropathy ([place])   . Osteoarthritis   . Osteoporosis   . PONV (postoperative nausea and vomiting)   . Postmenopausal bleeding   . S/P CABG (coronary artery bypass graft)    x5 - LIMA to LAD, SVG to diagonal, SVG to OM1, SVG to OM2, SVG to PDA (Dr. Prescott Gum)  . Seasonal allergies   . Sleep apnea    no CPAP machine; sleep study 02/2010 REM AHI 61.7/hr, total sleep REM 14.8/hr  . Uterine cancer ([place]) 7/15   clinical stage IA grade 1 endometrioid endometrial cancer   Social History  Substance Use Topics  . Smoking status: Never Smoker  . Smokeless tobacco: Never Used  . Alcohol use No   Family History  Problem Relation Age of Onset  . Heart attack Mother 71  . Heart disease Mother   . Hypertension Mother   . Diabetes Mother   . Hypertension Sister   . Heart failure Sister   . Heart disease Sister   . Diabetes type II Sister   . Kidney disease Brother     hypertension  . Hypertension Child   . Colon cancer Neg Hx    Review of Systems: ROS in HPI.  Otherwise: Review of Systems  Constitutional: Positive for chills. Negative for fever, malaise/fatigue and weight loss.  Respiratory: Negative for cough and shortness of breath.   Cardiovascular: Negative for chest pain and leg swelling.  Gastrointestinal: Negative for abdominal pain, constipation, diarrhea, nausea and vomiting.  Genitourinary: Positive for dysuria and frequency. Negative for flank pain and urgency.  Skin: Negative for rash.  Neurological: Negative for dizziness and weakness.   Physical Exam: Vitals:   12/21/16 0949  BP: 119/73  Pulse: 60  Temp: 98.3 F (36.8 C)  TempSrc: Oral  SpO2: 99%  Weight: (!) 329 lb 3.2 oz (149.3 kg)  Height: 5\' 8"  (1.727 m)   Physical Exam  Constitutional: She appears well-developed. She is cooperative. No distress.  Cardiovascular: Normal rate, regular rhythm, normal heart sounds and normal pulses.  Exam reveals no gallop.   No murmur heard. Pulmonary/Chest: Effort normal and breath sounds normal. No respiratory distress. She has no wheezes. She has no rhonchi. She has no rales. Breasts are symmetrical.  Abdominal: Soft. Bowel sounds are normal. There is no tenderness.  Musculoskeletal: She exhibits no edema.    Assessment & Plan:  See encounters tab for problem based medical decision making. Patient discussed with Dr. Eppie Gibson  UTI (urinary tract infection) Patient presents with a two-week history of increased urinary frequency. She reports that about a week ago she began experiencing dysuria, specifically sharp pain with urination. She denies any abdominal pain, flank pain, gross hematuria. She does note some change to the smell of her urine but denies any changes to the color or other quality. The patient has had urinary tract infections in the past, and she feels that she has another one presently. Patient denies any systemic signs fever or chills.  Urine dipstick in the clinic today was negative for leukocytes or nitrates but was  positive for moderate blood. Patient denies having any history of renal calculi. She has no family history of bladder cancer and no smoking history.  Assessment: Given her symptoms, I feel is most likely she has an acute urinary tract infection with hematuria. I would like to send for complete urinalysis with microscopy given the moderate amount of hemoglobin detected. Patient is a low risk for bladder cancer, but would recommend further evaluation if her urinalysis demonstrates microscopic hematuria.  Plan: Empiric nitrofurantoin 100 mg twice a day 5 days. Patient has multiple antibiotic allergies which preclude cephalosporins, penicillins, and fluoroquinolones. Complete UA with microscopic analysis.   Signed: Holley Raring, MD 12/21/2016, 10:37 AM  Pager: [phone]

## 2016-12-21 NOTE — Assessment & Plan Note (Signed)
Patient presents with a two-week history of increased urinary frequency. She reports that about a week ago she began experiencing dysuria, specifically sharp pain with urination. She denies any abdominal pain, flank pain, gross hematuria. She does note some change to the smell of her urine but denies any changes to the color or other quality. The patient has had urinary tract infections in the past, and she feels that she has another one presently. Patient denies any systemic signs fever or chills.  Urine dipstick in the clinic today was negative for leukocytes or nitrates but was positive for moderate blood. Patient denies having any history of renal calculi. She has no family history of bladder cancer and no smoking history.  Assessment: Given her symptoms, I feel is most likely she has an acute urinary tract infection with hematuria. I would like to send for complete urinalysis with microscopy given the moderate amount of hemoglobin detected. Patient is a low risk for bladder cancer, but would recommend further evaluation if her urinalysis demonstrates microscopic hematuria.  Plan: Empiric nitrofurantoin 100 mg twice a day 5 days. Patient has multiple antibiotic allergies which preclude cephalosporins, penicillins, and fluoroquinolones. Complete UA with microscopic analysis.

## 2016-12-21 NOTE — Progress Notes (Signed)
Case discussed with Dr. Gay Filler at the time of the visit. We reviewed the resident's history and exam and pertinent patient test results. I agree with the assessment, diagnosis, and plan of care documented in the resident's note.  She apparently has no history of tobacco abuse.  Review of the previous UAs suggest that she has had an occasional episode of microscopic hematuria, but this has not been consistent and thus may have been associated with some UTIs.  We will treat empirically because this is similar to previous presentations and also obtain a formal UA with microscopic analysis to assess for evidence of hematuria.  If present, it may need to be repeated after treatment of the UTI to see if it persists and thus may require further evaluation.

## 2016-12-21 NOTE — Patient Instructions (Signed)
It seems like you have a urinary tract infection. We detected some blood in the urine and will send it for further analysis. I will contact your with the results after they are available. I will send in a prescription for Nitrofurantoin or Macrobid to your pharmacy which you will take twice daily for 5 days.  Please let us know if you symptoms do not improve or worsen.

## 2016-12-22 ENCOUNTER — Encounter: Payer: Self-pay | Admitting: Internal Medicine

## 2016-12-22 ENCOUNTER — Other Ambulatory Visit: Payer: Self-pay | Admitting: Internal Medicine

## 2016-12-22 DIAGNOSIS — N3001 Acute cystitis with hematuria: Secondary | ICD-10-CM

## 2016-12-22 LAB — URINALYSIS, COMPLETE
Bilirubin, UA: NEGATIVE
GLUCOSE, UA: NEGATIVE
Ketones, UA: NEGATIVE
Leukocytes, UA: NEGATIVE
Nitrite, UA: NEGATIVE
PH UA: 5 (ref 5.0–7.5)
PROTEIN UA: NEGATIVE
Specific Gravity, UA: 1.011 (ref 1.005–1.030)
Urobilinogen, Ur: 0.2 mg/dL (ref 0.2–1.0)

## 2016-12-22 LAB — MICROSCOPIC EXAMINATION: CASTS: NONE SEEN /LPF

## 2016-12-22 NOTE — Assessment & Plan Note (Signed)
RTC after resolution of symptoms for recheck of UA to assess for persistence of microscopic hematuria. If still present, will consider further w/u and evaluation.

## 2016-12-27 ENCOUNTER — Telehealth: Payer: Self-pay | Admitting: *Deleted

## 2016-12-27 NOTE — Telephone Encounter (Signed)
Called pt again, she states she has finished abx now but still does not feel "normal", some pressure and urine smells odd, appt [facility] wed at 1015

## 2016-12-27 NOTE — Telephone Encounter (Signed)
-----   Message from Holley Raring, MD sent at 12/23/2016  6:28 AM EST ----- Regarding: Notify pt of results [name],  I'm nocturnal this month and was hoping you might be able to call [name] and notify her of her UA results from her [facility] clinic visit. Trace blood in the urine, likely related to s/p UTI. Would like repeat UA (lab visit, orders in) once her symptoms have completely resolved next week.  Thanks, Dr. Gay Filler  ----- Message ----- From: Marcelino Duster, CMA Sent: 12/21/2016  10:16 AM To: Holley Raring, MD

## 2016-12-27 NOTE — Telephone Encounter (Signed)
Thank you :)

## 2016-12-29 ENCOUNTER — Ambulatory Visit: Payer: Self-pay

## 2016-12-29 ENCOUNTER — Ambulatory Visit (INDEPENDENT_AMBULATORY_CARE_PROVIDER_SITE_OTHER): Payer: Medicare Other | Admitting: Internal Medicine

## 2016-12-29 ENCOUNTER — Encounter: Payer: Self-pay | Admitting: Internal Medicine

## 2016-12-29 VITALS — BP 155/49 | HR 58 | Temp 98.3°F | Ht 68.0 in | Wt 331.0 lb

## 2016-12-29 DIAGNOSIS — Z90710 Acquired absence of both cervix and uterus: Secondary | ICD-10-CM | POA: Diagnosis not present

## 2016-12-29 DIAGNOSIS — Z8542 Personal history of malignant neoplasm of other parts of uterus: Secondary | ICD-10-CM | POA: Diagnosis not present

## 2016-12-29 DIAGNOSIS — R3121 Asymptomatic microscopic hematuria: Secondary | ICD-10-CM

## 2016-12-29 DIAGNOSIS — N3001 Acute cystitis with hematuria: Secondary | ICD-10-CM

## 2016-12-29 DIAGNOSIS — R8299 Other abnormal findings in urine: Secondary | ICD-10-CM | POA: Diagnosis not present

## 2016-12-29 DIAGNOSIS — R319 Hematuria, unspecified: Secondary | ICD-10-CM | POA: Diagnosis not present

## 2016-12-29 LAB — POCT URINALYSIS DIPSTICK
BILIRUBIN UA: NEGATIVE
GLUCOSE UA: NEGATIVE
KETONES UA: NEGATIVE
Leukocytes, UA: NEGATIVE
NITRITE UA: NEGATIVE
PH UA: 7
Protein, UA: 30
SPEC GRAV UA: 1.02
Urobilinogen, UA: 1

## 2016-12-29 NOTE — Patient Instructions (Addendum)
It was a pleasure to meet you today [name],   We are concerned about the blood in your urine and need to investigate further,  I am sending you to have imaging of your kidneys and bladder, if you have not heard back about scheduling this in the next week give the clinic a call.   Please schedule a follow up appointment in 2 weeks, you should move this appointment to later if you haven't gone for the imaging test yet.

## 2016-12-29 NOTE — Progress Notes (Signed)
   CC: hematuria   HPI: [name] is a 61 y.o. with past medical history as outlined below who presents to clinic for follow up of hematuria.  Presented in early January with complaints of dysuria, frequency, foul smelling urine, and stinging with urination. She was prescribed a 5 day course of nitrofurantoin and these symptoms resolved. Her urine dipstick at that time showed moderate RBCs. On review of past urinalysis, she was noticed to have hematuria on multiple test dating since 06/2015. She reports no frank blood in her urine, She had had 3 weeks of urethral discomfort, it is not pain. Has a history of endometrioid cancer and has had a total hysterectomy.   Please see problem list for status of the pt's chronic medical problems.  Past Medical History:  Diagnosis Date  . Acute urinary retention s/p Foley 01/07/2012  . Carpal tunnel syndrome, bilateral   . Hemorrhoids, internal, with bleeding & prolapse 12/13/2011  . Hernia, abdominal   . History of kidney stones   . Hypertension   . Iron deficiency 12-07-2011  . Morbid obesity ([place])   . Myocardial infarction    unsure when; per cardiologist report  . Neuropathy ([place])   . Osteoarthritis   . Osteoporosis   . S/P CABG (coronary artery bypass graft)    x5 - LIMA to LAD, SVG to diagonal, SVG to OM1, SVG to OM2, SVG to PDA (Dr. Prescott Gum)  . Seasonal allergies   . Sleep apnea    no CPAP machine; sleep study 02/2010 REM AHI 61.7/hr, total sleep REM 14.8/hr  . Uterine cancer ([place]) 7/15   clinical stage IA grade 1 endometrioid endometrial cancer    Review of Systems:  Please see each problem below for a pertinent review of systems.  Physical Exam:  Vitals:   12/29/16 1039  BP: (!) 155/49  Pulse: (!) 58  Temp: 98.3 F (36.8 C)  TempSrc: Oral  SpO2: 99%  Weight: (!) 331 lb (150.1 kg)  Height: 5\' 8"  (1.727 m)   Physical Exam  Constitutional: She appears well-developed and well-nourished. No distress.  HENT:  Head:  Normocephalic and atraumatic.  Eyes: Conjunctivae are normal. No scleral icterus.  Abdominal: Soft. Bowel sounds are normal. She exhibits no distension and no mass. There is no tenderness. There is no guarding.  Neurological: She is alert.  Skin: She is not diaphoretic.  Psychiatric: She has a normal mood and affect. Her behavior is normal.    Assessment & Plan:   See Encounters Tab for problem based charting.  Hematuria  Urine dipstick today showed moderate hemoglobin. This finding is concerning for urinary tract malignancy and will need further workup with imaging.  - ordered complete urinalysis and urine cytology  - ordered CT abdomen and pelvis with and without contrast/ CT urogram  - referral to urology    Patient discussed with Dr. Dareen Piano

## 2016-12-30 LAB — MICROSCOPIC EXAMINATION: Casts: NONE SEEN /lpf

## 2016-12-30 LAB — URINALYSIS, ROUTINE W REFLEX MICROSCOPIC
Bilirubin, UA: NEGATIVE
Glucose, UA: NEGATIVE
Ketones, UA: NEGATIVE
LEUKOCYTES UA: NEGATIVE
NITRITE UA: NEGATIVE
Protein, UA: NEGATIVE
Specific Gravity, UA: 1.023 (ref 1.005–1.030)
Urobilinogen, Ur: 1 mg/dL (ref 0.2–1.0)
pH, UA: 7.5 (ref 5.0–7.5)

## 2016-12-30 NOTE — Assessment & Plan Note (Addendum)
Urine dipstick today showed moderate hemoglobin. This finding is concerning for urinary tract malignancy and will need further workup with imaging.  - ordered complete urinalysis and urine cytology  - ordered CT abdomen and pelvis with and without contrast/ CT urogram  - referral to urology   Addendum: Urine cytology was negative for malignant cells. Will continue with the plan for referral to urology and CT urogram.

## 2017-01-05 ENCOUNTER — Telehealth: Payer: Self-pay | Admitting: Internal Medicine

## 2017-01-05 NOTE — Progress Notes (Signed)
Internal Medicine Clinic Attending  Case discussed with Dr. Blum at the time of the visit.  We reviewed the resident's history and exam and pertinent patient test results.  I agree with the assessment, diagnosis, and plan of care documented in the resident's note. 

## 2017-01-05 NOTE — Telephone Encounter (Signed)
Returned call to patient.She stated she has been having chest pain off and on for the past 3 weeks.Stated she has a keloid on her chest and she thought pain was coming from that.Stated she already scheduled appointment with Ignacia Bayley NP tomorrow at 10:00 am.No chest pain at present.

## 2017-01-05 NOTE — Telephone Encounter (Signed)
New message    Pt c/o of Chest Pain: STAT if CP now or developed within 24 hours  1. Are you having CP right now? No not right this minute  2. Are you experiencing any other symptoms (ex. SOB, nausea, vomiting, sweating)? No.   3. How long have you been experiencing CP? 3 weeks  4. Is your CP continuous or coming and going? Coming and going.   5. Have you taken Nitroglycerin? No. ?  Scheduled pt appt for 2/15 at 10am with Murray Hodgkins.

## 2017-01-06 ENCOUNTER — Ambulatory Visit (INDEPENDENT_AMBULATORY_CARE_PROVIDER_SITE_OTHER): Payer: Medicare Other | Admitting: Nurse Practitioner

## 2017-01-06 ENCOUNTER — Encounter: Payer: Self-pay | Admitting: Nurse Practitioner

## 2017-01-06 DIAGNOSIS — E782 Mixed hyperlipidemia: Secondary | ICD-10-CM

## 2017-01-06 DIAGNOSIS — R0789 Other chest pain: Secondary | ICD-10-CM | POA: Diagnosis not present

## 2017-01-06 DIAGNOSIS — I25119 Atherosclerotic heart disease of native coronary artery with unspecified angina pectoris: Secondary | ICD-10-CM

## 2017-01-06 DIAGNOSIS — I10 Essential (primary) hypertension: Secondary | ICD-10-CM | POA: Diagnosis not present

## 2017-01-06 NOTE — Progress Notes (Signed)
Office Visit    Patient Name: [name] Date of Encounter: 01/06/2017  Primary Care Provider:  Axel Filler, MD Primary Cardiologist:  C. Hilty, MD   Chief Complaint    61 year old female with a prior history of CAD status post coronary artery bypass grafting, hypertension, hyperlipidemia, and morbid obesity, who presents for follow-up related to midsternal chest pain.  Past Medical History    Past Medical History:  Diagnosis Date  . Acute urinary retention s/p Foley 01/07/2012  . CAD (coronary artery disease)    a. 12/2013 s/p CABG x 5 Dr. Darcey Nora (LIMA to LAD, SVG to diagonal, SVG to OM1, SVG to OM2, SVG to PDA)  . Carotid arterial disease ([place])    a. 12/2013 Carotid U/S: 1-39% bilat ICA stenosis.  . Carpal tunnel syndrome, bilateral   . Hemorrhoids, internal, with bleeding & prolapse 12/13/2011  . Hernia, abdominal   . History of kidney stones   . Hypertension   . Iron deficiency 12-07-2011  . Keloid scar    a. Sternal Keloid s/p CABG with ongoing pain.  . Morbid obesity ([place])   . Neuropathy ([place])   . Osteoarthritis   . Osteoporosis   . Seasonal allergies   . Sleep apnea    no CPAP machine; sleep study 02/2010 REM AHI 61.7/hr, total sleep REM 14.8/hr  . Uterine cancer ([place]) 7/15   clinical stage IA grade 1 endometrioid endometrial cancer   Past Surgical History:  Procedure Laterality Date  . ABDOMINAL HYSTERECTOMY    . BREAST REDUCTION SURGERY Bilateral 10/10/2015   Procedure: BILATERAL MAMMARY REDUCTION  (BREAST) WITH FREE NIPPLE GRAFT;  Surgeon: Irene Limbo, MD;  Location: [employer] [facility];  Service: Plastics;  Laterality: Bilateral;  . BREAST SURGERY     reduction  . CARDIAC CATHETERIZATION    . COLONOSCOPY  2009  . COLONOSCOPY W/ BIOPSIES AND POLYPECTOMY    . CORONARY ARTERY BYPASS GRAFT N/A 01/09/2014   Procedure: CORONARY ARTERY BYPASS GRAFTING (CABG) x 5 using left internal mammary artery and right leg greater saphenous vein harvested endoscopically;   Surgeon: Ivin Poot, MD;  Location: [place] [facility];  Service: Open Heart Surgery;  Laterality: N/A;  please use bed extenders and breast binder  . INTRAOPERATIVE TRANSESOPHAGEAL ECHOCARDIOGRAM N/A 01/09/2014   Procedure: INTRAOPERATIVE TRANSESOPHAGEAL ECHOCARDIOGRAM;  Surgeon: Ivin Poot, MD;  Location: [facility];  Service: Open Heart Surgery;  Laterality: N/A;  . KNEE ARTHROSCOPY  1991  . KNEE SURGERY Bilateral 1999  . LEFT HEART CATHETERIZATION WITH CORONARY ANGIOGRAM N/A 01/04/2014   Procedure: LEFT HEART CATHETERIZATION WITH CORONARY ANGIOGRAM;  Surgeon: Sinclair Grooms, MD;  Location: [facility] C[facility] LAB;  Service: Cardiovascular;  Laterality: N/A;  . LESION EXCISION WITH COMPLEX REPAIR N/A 05/07/2016   Procedure: COMPLEX REPAIR OF CHEST 16 CM;  Surgeon: Irene Limbo, MD;  Location: [place];  Service: Plastics;  Laterality: N/A;  . MULTIPLE EXTRACTIONS WITH ALVEOLOPLASTY N/A 04/11/2014   Procedure: Extraction of tooth #'s 1,2,8,16 with alveoloplasty, maxillary tuberosity reductions, and gross debridement of remaining teeth.;  Surgeon: Lenn Cal, DDS;  Location: [place];  Service: Oral Surgery;  Laterality: N/A;  . MULTIPLE TOOTH EXTRACTIONS    . NM MYOCAR PERF WALL MOTION  01/2010   dipyridamole myoview - moderate perfusion defect in basal inferoseptal, basal inferior, mid inferoseptal, mid inferior, apical inferior region; EF 56%  . TEE WITHOUT CARDIOVERSION  01/2010   EF 60-65%, small, flat, non-infiltrating, calcified, fixed apical/septal mass  . TOTAL  KNEE ARTHROPLASTY Left 04/29/2011  . transanal hemorrhoidal dearterliaization  01/06/12   with external hemorrhoid removal  . UNILATERAL SALPINGECTOMY Right 06/03/2014   Procedure: UNILATERAL SALPINGECTOMY;  Surgeon: Lavonia Drafts, MD;  Location: [facility];  Service: Gynecology;  Laterality: Right;  Marland Kitchen VAGINAL HYSTERECTOMY N/A 06/03/2014   Procedure: HYSTERECTOMY VAGINAL;  Surgeon: Lavonia Drafts, MD;  Location: [facility];  Service:  Gynecology;  Laterality: N/A;  . WISDOM TOOTH EXTRACTION      Allergies  Allergies  Allergen Reactions  . Cephalosporins Anaphylaxis, Swelling and Other (See Comments)    Tongue swelling, gum pain  . Eicosapentaenoic Acid (Epa) Anaphylaxis and Shortness Of Breath  . Fish-Derived Products Anaphylaxis and Shortness Of Breath  . Peanuts [Peanut Oil] Anaphylaxis, Shortness Of Breath and Other (See Comments)    Peanut butter  . Latex Itching  . Metronidazole Other (See Comments)    Palpitations, mild SOB, metallic taste, dry mouth, high blood pressure  . Ciprofloxacin Other (See Comments)    Gaging and achy  . Lisinopril Cough       . Naproxen Nausea Only and Other (See Comments)    Headache  . Other Itching and Other (See Comments)     All pain meds make her itch - has to have something to prevent that in addition to receiving med  . Penicillins Other (See Comments)    Reaction unknown occurred during childhood but pt thinks she is no longer allergic Has patient had a PCN reaction causing immediate rash, facial/tongue/throat swelling, SOB or lightheadedness with hypotension: reaction unknown  Has patient had a PCN reaction causing severe rash involving mucus membranes or skin necrosis: reaction unknown Has patient had a PCN reaction that required hospitalization yes Has patient had a PCN reaction occurring within the last 10 years: no childhood reaction If all o  . Prednisone Other (See Comments)    Heart beating fast       History of Present Illness    61 year old female with the above complex past medical history including coronary artery disease status post coronary artery bypass grafting 5 in February 2015. Other history includes hypertension, hyperlipidemia, morbid obesity, uterine cancer status post hysterectomy, and keloid of sternal scar with intermittent pain and irritation. She was last seen in clinic in August 2017, at which time she was doing relatively well. She  was having some lower extremity swelling and was prescribed when necessary Lasix at that time. She notes that since about November 2017, she has been experiencing intermittent discomfort occurring in the epigastric and lower chest area multiple times per day, lasting just a few seconds at a time, and resolving spontaneously. There are no associated symptoms. She also has some discomfort related to her keloid/sternal scar but she is pretty sure she can tell the difference between the 2 types of discomfort. Though the chest discomfort symptoms are fleeting, they are somewhat reminiscent of what she experienced prior to her bypass surgery. Symptoms seem to be more likely to occur when she is lying on her right side but overallhave been variable. She has not had any significant exertional symptoms though notes that she has not been particularly active. Ever since her hysterectomy, her exercise has really fallen off. She denies dyspnea, palpitations, PND, orthopnea, dizziness, syncope, or early satiety. She does occasionally have mild lower extremity swelling especially in her toes.  Home Medications    Prior to Admission medications   Medication Sig Start Date End Date Taking? Authorizing Provider  albuterol (PROVENTIL HFA;VENTOLIN HFA)  108 (90 BASE) MCG/ACT inhaler Inhale 2 puffs into the lungs every 6 (six) hours as needed for wheezing or shortness of breath. 03/30/15  Yes Gregor Hams, MD  aspirin 81 MG chewable tablet Chew 81 mg by mouth daily.   Yes Historical Provider, MD  cycloSPORINE (RESTASIS) 0.05 % ophthalmic emulsion Place 1 drop into both eyes 2 (two) times daily.   Yes Historical Provider, MD  erythromycin ophthalmic ointment Place a 1/2 inch ribbon of ointment into the lower eyelid twice daily x 10 days 07/14/16  Yes Samantha Tripp Dowless, PA-C  esomeprazole (NEXIUM) 40 MG capsule TAKE ONE CAPSULE BY MOUTH EVERY DAY 12/13/16  Yes Axel Filler, MD  furosemide (LASIX) 20 MG tablet TAKE 1  TABLET BY MOUTH DAILY AS NEEDED FOR EDEMA. 11/05/16  Yes [name], MD  ipratropium (ATROVENT) 0.06 % nasal spray Place 2 sprays into both nostrils 4 (four) times daily. Patient taking differently: Place 2 sprays into both nostrils 4 (four) times daily as needed for rhinitis.  03/30/15  Yes Gregor Hams, MD  irbesartan (AVAPRO) 150 MG tablet TAKE 1 TABLET BY MOUTH EVERY DAY 07/12/16  Yes [name], MD  meloxicam (MOBIC) 15 MG tablet Take 15 mg by mouth daily as needed for pain.  04/01/16  Yes Historical Provider, MD  metoprolol tartrate (LOPRESSOR) 25 MG tablet TAKE 1.5 TABLETS BY MOUTH TWICE A DAY 11/10/16  Yes [name], MD  Multiple Vitamin (MULTIVITAMIN WITH MINERALS) TABS tablet Take 1 tablet by mouth every Monday, Wednesday, and Friday.   Yes Historical Provider, MD  rosuvastatin (CRESTOR) 20 MG tablet TAKE 1 TABLET BY MOUTH EVERY DAY 12/10/16  Yes [name], MD    Review of Systems    Somewhat atypical chest discomfort as outlined above.  She denies palpitations, dyspnea, PND, orthopnea, dizziness, syncope, or early satiety. She often has lower extremity swelling at the end of the day, particularly in her toes. All other systems reviewed and are otherwise negative except as noted above.  Physical Exam    VS:  Blood pressure 130/80, heart rate 55, respirations 16. Weight 325 pounds. Height 5 foot 8.  GEN: Well nourished, well developed, in no acute distress.  HEENT: normal.  Neck: Supple, no JVD, carotid bruits, or masses. Cardiac: RRR, no murmurs, rubs, or gallops. No clubbing, cyanosis, edema.  Radials/DP/PT 2+ and equal bilaterally.  Respiratory:  Respirations regular and unlabored, clear to auscultation bilaterally. GI: Soft, nontender, nondistended, BS + x 4. MS: no deformity or atrophy. Skin: warm and dry, no rash. Neuro:  Strength and sensation are intact. Psych: Normal affect.  Accessory Clinical Findings    ECG - Sinus bradycardia, 55, prior anterior  infarct, nonspecific T changes.  Assessment & Plan    1.  Midsternal chest pain/coronary artery disease: Patient is status post CABG 5 in 2015 and over the past 4 months, has been having intermittent epigastric and lower sternal chest discomfort that occurs predominantly at rest and sometimes with certain positions, is fleeting in nature without associated symptoms, and resolve spontaneously. The symptoms are somewhat atypical, she says they're reminiscent of symptoms that she experienced prior to her bypass surgery. In that setting, I will arrange for a 2 day Lexiscan Myoview.  Continue aspirin, beta blocker, ARB, and statin therapy.  2. Hypertensive heart disease: Blood pressure stable today at 130/80. Continue beta blocker and ARB.  3. Hyperlipidemia: Last LDL we have on file was 123 in November 2014. This has  been followed by her primary care provider. She is on Crestor 20 mg.  4. Morbid obesity: She was exercising but notes that since her hysterectomy over a year ago, she has really fallen out of the habit of exercising. She would like to start up again. As above, we will obtain a Myoview. Provided this is nonischemic, I would encourage her to begin regular activity.  5. Disposition. Follow-up stress testing as outlined above. Follow up in clinic in 2-3 months or sooner if necessary.   Murray Hodgkins, NP 01/06/2017, 12:53 PM

## 2017-01-06 NOTE — Patient Instructions (Signed)
Medication Instructions: Your physician recommends that you continue on your current medications as directed. Please refer to the Current Medication list given to you today.   Testing/Procedures: Your physician has requested that you have a lexiscan myoview (2 day protocol). For further information please visit [email or web address]. Please follow instruction sheet, as given.  Follow-Up: Your physician recommends that you schedule a follow-up appointment in: 2-3 months with Dr. Debara Pickett.   Any Other Special Instructions will be listed below:  Pharmacologic Stress Electrocardiogram A pharmacologic stress electrocardiogram is a heart (cardiac) test that uses nuclear imaging to evaluate the blood supply to your heart. This test may also be called a pharmacologic stress electrocardiography. Pharmacologic means that a medicine is used to increase your heart rate and blood pressure.  This stress test is done to find areas of poor blood flow to the heart by determining the extent of coronary artery disease (CAD). Some people exercise on a treadmill, which naturally increases the blood flow to the heart. For those people unable to exercise on a treadmill, a medicine is used. This medicine stimulates your heart and will cause your heart to beat harder and more quickly, as if you were exercising.  Pharmacologic stress tests can help determine:  The adequacy of blood flow to your heart during increased levels of activity in order to clear you for discharge home.  The extent of coronary artery blockage caused by CAD.  Your prognosis if you have suffered a heart attack.  The effectiveness of cardiac procedures done, such as an angioplasty, which can increase the circulation in your coronary arteries.  Causes of chest pain or pressure. LET [facility] CARE PROVIDER KNOW ABOUT:  Any allergies you have.  All medicines you are taking, including vitamins, herbs, eye drops, creams, and over-the-counter  medicines.  Previous problems you or members of your family have had with the use of anesthetics.  Any blood disorders you have.  Previous surgeries you have had.  Medical conditions you have.  Possibility of pregnancy, if this applies.  If you are currently breastfeeding. RISKS AND COMPLICATIONS Generally, this is a safe procedure. However, as with any procedure, complications can occur. Possible complications include:  You develop pain or pressure in the following areas:  Chest.  Jaw or neck.  Between your shoulder blades.  Radiating down your left arm.  Headache.  Dizziness or light-headedness.  Shortness of breath.  Increased or irregular heartbeat.  Low blood pressure.  Nausea or vomiting.  Flushing.  Redness going up the arm and slight pain during injection of medicine.  Heart attack (rare). BEFORE THE PROCEDURE   Avoid all forms of caffeine for 24 hours before your test or as directed by your health care provider. This includes coffee, tea (even decaffeinated tea), caffeinated sodas, chocolate, cocoa, and certain pain medicines.  Follow your health care provider's instructions regarding eating and drinking before the test.  Take your medicines as directed at regular times with water unless instructed otherwise. Exceptions may include:  If you have diabetes, ask how you are to take your insulin or pills. It is common to adjust insulin dosing the morning of the test.  If you are taking beta-blocker medicines, it is important to talk to your health care provider about these medicines well before the date of your test. Taking beta-blocker medicines may interfere with the test. In some cases, these medicines need to be changed or stopped 24 hours or more before the test.  If you wear a nitroglycerin patch,  it may need to be removed prior to the test. Ask your health care provider if the patch should be removed before the test.  If you use an inhaler for any  breathing condition, bring it with you to the test.  If you are an outpatient, bring a snack so you can eat right after the stress phase of the test.  Do not smoke for 4 hours prior to the test or as directed by your health care provider.  Do not apply lotions, powders, creams, or oils on your chest prior to the test.  Wear comfortable shoes and clothing. Let your health care provider know if you were unable to complete or follow the preparations for your test. PROCEDURE   Multiple patches (electrodes) will be put on your chest. If needed, small areas of your chest may be shaved to get better contact with the electrodes. Once the electrodes are attached to your body, multiple wires will be attached to the electrodes, and your heart rate will be monitored.  An IV access will be started. A nuclear trace (isotope) is given. The isotope may be given intravenously, or it may be swallowed. Nuclear refers to several types of radioactive isotopes, and the nuclear isotope lights up the arteries so that the nuclear images are clear. The isotope is absorbed by your body. This results in low radiation exposure.  A resting nuclear image is taken to show how your heart functions at rest.  A medicine is given through the IV access.  A second scan is done about 1 hour after the medicine injection and determines how your heart functions under stress.  During this stress phase, you will be connected to an electrocardiogram machine. Your blood pressure and oxygen levels will be monitored. AFTER THE PROCEDURE   Your heart rate and blood pressure will be monitored after the test.  You may return to your normal schedule, including diet,activities, and medicines, unless your health care provider tells you otherwise. This information is not intended to replace advice given to you by your health care provider. Make sure you discuss any questions you have with your health care provider. Document Released:  03/27/2009 Document Revised: 11/13/2013 Document Reviewed: 07/16/2013 Elsevier Interactive Patient Education  2017 [employer].    If you need a refill on your cardiac medications before your next appointment, please call your pharmacy.

## 2017-01-10 ENCOUNTER — Ambulatory Visit (HOSPITAL_COMMUNITY): Admission: RE | Admit: 2017-01-10 | Payer: Medicare Other | Source: Ambulatory Visit

## 2017-01-12 ENCOUNTER — Encounter: Payer: Self-pay | Admitting: Student in an Organized Health Care Education/Training Program

## 2017-01-12 ENCOUNTER — Ambulatory Visit: Payer: Self-pay

## 2017-01-13 ENCOUNTER — Telehealth (HOSPITAL_COMMUNITY): Payer: Self-pay

## 2017-01-13 NOTE — Telephone Encounter (Signed)
Encounter complete. 

## 2017-01-19 ENCOUNTER — Ambulatory Visit (HOSPITAL_COMMUNITY)
Admission: RE | Admit: 2017-01-19 | Discharge: 2017-01-19 | Disposition: A | Discharge status: Home / Self Care | Payer: Medicare Other | Source: Ambulatory Visit | Attending: Cardiovascular Disease | Admitting: Cardiovascular Disease

## 2017-01-19 DIAGNOSIS — I2510 Atherosclerotic heart disease of native coronary artery without angina pectoris: Secondary | ICD-10-CM | POA: Diagnosis not present

## 2017-01-19 DIAGNOSIS — Z951 Presence of aortocoronary bypass graft: Secondary | ICD-10-CM | POA: Diagnosis not present

## 2017-01-19 DIAGNOSIS — R0789 Other chest pain: Secondary | ICD-10-CM | POA: Diagnosis not present

## 2017-01-19 DIAGNOSIS — E669 Obesity, unspecified: Secondary | ICD-10-CM | POA: Diagnosis not present

## 2017-01-19 DIAGNOSIS — J45909 Unspecified asthma, uncomplicated: Secondary | ICD-10-CM | POA: Insufficient documentation

## 2017-01-19 DIAGNOSIS — G4733 Obstructive sleep apnea (adult) (pediatric): Secondary | ICD-10-CM | POA: Diagnosis not present

## 2017-01-19 DIAGNOSIS — I252 Old myocardial infarction: Secondary | ICD-10-CM | POA: Insufficient documentation

## 2017-01-19 DIAGNOSIS — I10 Essential (primary) hypertension: Secondary | ICD-10-CM | POA: Diagnosis not present

## 2017-01-19 MED ORDER — AMINOPHYLLINE 25 MG/ML IV SOLN
100.0000 mg | Freq: Once | INTRAVENOUS | Status: AC
  Administered 2017-01-19: 100 mg via INTRAVENOUS

## 2017-01-19 MED ORDER — TECHNETIUM TC 99M TETROFOSMIN IV KIT
28.0000 | PACK | Freq: Once | INTRAVENOUS | Status: AC | PRN
Start: 2017-01-19 — End: 2017-01-19
  Administered 2017-01-19: 28 via INTRAVENOUS
  Filled 2017-01-19: qty 28

## 2017-01-19 MED ORDER — REGADENOSON 0.4 MG/5ML IV SOLN
0.4000 mg | Freq: Once | INTRAVENOUS | Status: AC
  Administered 2017-01-19: 0.4 mg via INTRAVENOUS

## 2017-01-20 ENCOUNTER — Ambulatory Visit (HOSPITAL_COMMUNITY)
Admission: RE | Admit: 2017-01-20 | Discharge: 2017-01-20 | Disposition: A | Discharge status: Home / Self Care | Payer: Medicare Other | Source: Ambulatory Visit | Attending: Cardiology | Admitting: Cardiology

## 2017-01-20 MED ORDER — TECHNETIUM TC 99M TETROFOSMIN IV KIT
29.5000 | PACK | Freq: Once | INTRAVENOUS | Status: AC | PRN
  Administered 2017-01-20: 29.5 via INTRAVENOUS

## 2017-01-21 LAB — MYOCARDIAL PERFUSION IMAGING
CSEPPHR: 94 {beats}/min
LV dias vol: 120 mL (ref 46–106)
LV sys vol: 61 mL
Rest HR: 50 {beats}/min
SDS: 2
SRS: 1
SSS: 3
TID: 1.02

## 2017-01-31 DIAGNOSIS — R3121 Asymptomatic microscopic hematuria: Secondary | ICD-10-CM | POA: Diagnosis not present

## 2017-01-31 DIAGNOSIS — N200 Calculus of kidney: Secondary | ICD-10-CM | POA: Diagnosis not present

## 2017-01-31 DIAGNOSIS — N3021 Other chronic cystitis with hematuria: Secondary | ICD-10-CM | POA: Diagnosis not present

## 2017-02-08 DIAGNOSIS — R3121 Asymptomatic microscopic hematuria: Secondary | ICD-10-CM | POA: Diagnosis not present

## 2017-02-08 DIAGNOSIS — N200 Calculus of kidney: Secondary | ICD-10-CM | POA: Diagnosis not present

## 2017-02-15 DIAGNOSIS — R3121 Asymptomatic microscopic hematuria: Secondary | ICD-10-CM | POA: Diagnosis not present

## 2017-02-21 ENCOUNTER — Telehealth: Payer: Self-pay

## 2017-02-21 NOTE — Telephone Encounter (Signed)
Pharmacy request for 90 day Nitrofurantoin Mono-MCR 100 mg not on current medication list

## 2017-02-22 ENCOUNTER — Other Ambulatory Visit: Payer: Self-pay | Admitting: *Deleted

## 2017-02-22 NOTE — Telephone Encounter (Signed)
Chronic nitrofurantoin may have been ordered by urology, she was referred there in February. However, I do not have any office notes to document that this is the correct medication. I would suggest the pharmacy contact the ordering physician for questions. I would be happy to help if we can get records from the urology office.

## 2017-02-23 MED ORDER — ESOMEPRAZOLE MAGNESIUM 40 MG PO CPDR: 40.0000 mg | DELAYED_RELEASE_CAPSULE | Freq: Every day | ORAL | 2 refills | Status: DC

## 2017-03-21 ENCOUNTER — Encounter: Payer: Self-pay | Admitting: Student in an Organized Health Care Education/Training Program

## 2017-03-21 ENCOUNTER — Encounter: Payer: Self-pay | Admitting: Gastroenterology

## 2017-03-21 ENCOUNTER — Ambulatory Visit (INDEPENDENT_AMBULATORY_CARE_PROVIDER_SITE_OTHER): Payer: Medicare Other | Admitting: Internal Medicine

## 2017-03-21 ENCOUNTER — Ambulatory Visit (INDEPENDENT_AMBULATORY_CARE_PROVIDER_SITE_OTHER): Payer: Medicare Other | Admitting: Student in an Organized Health Care Education/Training Program

## 2017-03-21 ENCOUNTER — Encounter (INDEPENDENT_AMBULATORY_CARE_PROVIDER_SITE_OTHER): Payer: Self-pay

## 2017-03-21 ENCOUNTER — Encounter: Payer: Self-pay | Admitting: Internal Medicine

## 2017-03-21 VITALS — BP 132/77 | HR 77 | Ht 68.0 in | Wt 324.0 lb

## 2017-03-21 VITALS — BP 146/60 | HR 62 | Temp 98.3°F | Ht 68.0 in | Wt 325.9 lb

## 2017-03-21 DIAGNOSIS — R0789 Other chest pain: Secondary | ICD-10-CM | POA: Diagnosis not present

## 2017-03-21 DIAGNOSIS — I2510 Atherosclerotic heart disease of native coronary artery without angina pectoris: Secondary | ICD-10-CM | POA: Diagnosis not present

## 2017-03-21 DIAGNOSIS — Z96652 Presence of left artificial knee joint: Secondary | ICD-10-CM

## 2017-03-21 DIAGNOSIS — G4733 Obstructive sleep apnea (adult) (pediatric): Secondary | ICD-10-CM | POA: Diagnosis not present

## 2017-03-21 DIAGNOSIS — E785 Hyperlipidemia, unspecified: Secondary | ICD-10-CM | POA: Diagnosis not present

## 2017-03-21 DIAGNOSIS — I10 Essential (primary) hypertension: Secondary | ICD-10-CM | POA: Diagnosis not present

## 2017-03-21 DIAGNOSIS — M170 Bilateral primary osteoarthritis of knee: Secondary | ICD-10-CM

## 2017-03-21 DIAGNOSIS — Z951 Presence of aortocoronary bypass graft: Secondary | ICD-10-CM

## 2017-03-21 DIAGNOSIS — Z6841 Body Mass Index (BMI) 40.0 and over, adult: Secondary | ICD-10-CM

## 2017-03-21 DIAGNOSIS — M25461 Effusion, right knee: Secondary | ICD-10-CM | POA: Diagnosis not present

## 2017-03-21 DIAGNOSIS — Z0000 Encounter for general adult medical examination without abnormal findings: Secondary | ICD-10-CM

## 2017-03-21 DIAGNOSIS — Z86010 Personal history of colonic polyps: Secondary | ICD-10-CM

## 2017-03-21 MED ORDER — IRBESARTAN 300 MG PO TABS: 300.0000 mg | ORAL_TABLET | Freq: Every day | ORAL | 3 refills | Status: DC

## 2017-03-21 NOTE — Patient Instructions (Signed)
1. It was great seeing you. Keep up the great work with your diet. We set a goal to lose 10 lbs in the next two months.   2. Your blood pressure is a little high. I want to increase your Avapro (Irbesartan) to 300 mg once a day. I have sent a new prescription to your pharmacy.   3. For your knee pain, continue to use meloxicam only as needed when the pain is really bad. We can examine it with an ultrasound and do a steroid injection in our office to try to improve the pain in the future. Let me know if you want me to refer you back to physical therapy.

## 2017-03-21 NOTE — Assessment & Plan Note (Signed)
Patient has been successful in losing 6 pounds since her last visit. Current weight is 325 pounds, BMI 49.7. She reports increasing her water consumption, trying to reduce sugar. We set a goal of losing 10 pounds over the next 2 months. His most list be done with diet as she has limited ability to exercise given arthritis of the right knee. She does want try to get back into pool-based aerobics as well.

## 2017-03-21 NOTE — Assessment & Plan Note (Signed)
Last colonoscopy in 2013 showed one adenomatous polyp. She is due for her 5 year surveillance colonoscopy which I have ordered.

## 2017-03-21 NOTE — Assessment & Plan Note (Signed)
Blood pressure consistently elevated today with systolics above 779. Patient has significant vascular disease with a recent CABG. No symptoms of hypotension, no CKD. Plan to increase irbesartan from 150mg  daily to 300mg  daily. Follow up in 2-3 months for repeat BMP and BP recheck.

## 2017-03-21 NOTE — Assessment & Plan Note (Signed)
Total left knee arthroplasty about 6 years ago has been successful with no residual pain. Now she has significant trouble with the right knee arthritis. Xray 2016 showed mild tricompartamental OA. I advised supportive care, we can try steroid injection and POCUS to look for effusion. We are working on weight loss actively. I offered PT but she defered. She may need a TKA of the right in the near future.

## 2017-03-21 NOTE — Patient Instructions (Signed)
Dr. Debara Pickett recommends that you monitor your home blood pressures.  He recommends an ARM blood pressure cuff - OMRON is a good brand.   Your physician wants you to follow-up in: 6 months with Dr. Debara Pickett. You will receive a reminder letter in the mail two months in advance. If you don't receive a letter, please call our office to schedule the follow-up appointment.

## 2017-03-21 NOTE — Progress Notes (Signed)
Assessment and Plan:  See Encounters tab for problem-based medical decision making.   __________________________________________________________  HPI:  61 year old woman here for follow-up of hypertension. Patient is doing well at home. She lives by herself but reports that her 43 year old granddaughter might come to live with her soon. She complains of increasing pain in her right knee which is limiting her mobility and ability to exercise. She now walks with a cane because of it. Says that the surgeons offered her knee replacements on the right in the past, but she has deferred because she had such a difficult time with the left side knee replacement. She had bypass surgery last year and reports doing well at home. No chest pain, angina, dyspnea on exertion, or PND. She reports good compliance with her medications, no adverse side effects. Denies dizziness on standing. No recent falls. She is working on weight loss, says she's making changes to her diet. Wants to get back into exercise.  __________________________________________________________  Problem List: Patient Active Problem List   Diagnosis Date Noted  . Atherosclerosis of aorta ([place]) 06/20/2015    Priority: High  . Coronary atherosclerosis of native coronary artery 01/05/2014    Priority: High  . Morbid obesity due to excess calories ([place]) 05/10/2012    Priority: Medium  . Obstructive sleep apnea 03/27/2010    Priority: Medium  . Essential hypertension 06/30/2007    Priority: Medium  . Osteoarthritis of both knees 11/07/2014    Priority: Low  . Health care maintenance 10/09/2014    Priority: Low  . GERD 08/06/2008    Priority: Low  . Hyperlipidemia 10/31/2007    Priority: Low    Medications: Reconciled today in Epic __________________________________________________________  Physical Exam:  Vital Signs: Vitals:   03/21/17 0945 03/21/17 1035  BP: (!) 142/76 (!) 146/60  Pulse: 70 62  Temp: 98.3 F (36.8 C)     TempSrc: Oral   SpO2: 100%   Weight: (!) 325 lb 14.4 oz (147.8 kg)   Height: 5\' 8"  (1.727 m)     Gen: Well appearing, obese woman, NAD Neck: No cervical LAD, No JVD. CV: RRR, no murmurs Pulm: Normal effort, CTA throughout, no wheezing  Ext: Warm, no edema, right knee is obese, small effusion felt, mild crepitus. Skin: No atypical appearing moles. No rashes, midline hypertrophic chest scar is well healed without tenderness to palpation today.

## 2017-03-21 NOTE — Progress Notes (Signed)
OFFICE NOTE  Chief Complaint:  Follow-up stress test  Primary Care Physician: Axel Filler, MD  HPI:  [name] is a 61 year old African American obese female presented to 01/01/14 with symptoms of unstable angina. I had seen her once in 2012, but not since then.  She recently had been having chest pain and presented urgently when she became short of breath at home and had chest tightness, which started as right arm pain and right chest pain, then moved substernally. She ultimately underwent cardiac catheterization via right radial artery which demonstrated severe three-vessel CAD with graftable target vessels. Echocardiogram showed good LV function with some left ventricular hypertrophy from her hypertension and there does not appear to be significant valve disease. Carotid dopplers showed a 1-39% bilateral carotid artery stenosis. She underwent a CABG x 5 on 01/09/2014. Left internal mammary artery to LAD, saphenous vein graft to diagonal, sequential saphenous vein graft to OM1 and OM2, saphenous vein graft to posterior descending. She had multiple teeth extractions prior to surgery. She did well post op. She was anemic but did not need transfusion. At discharge she went to SNF for rehab-she did not stay the recommended time frame due to BR not being available when she needed it. She was not given discharge medications when she left SNF. She saw Cecilie Kicks, FNP, in followup for her hospitalization and was restarted on some of her medications.   [name] was seen today in the office for follow-up. At her last office visit I cleared her for surgery and she underwent hysterectomy and has had resolution of her bleeding problems. She is now contemplating right knee surgery and also breast reduction surgery. She's in the interim seen in the hospital for palpitations. She reports those have improved with medication adjustment. She's decreased her Lasix now to every other day and I think  she can probably take it as needed. She denies any chest pain worsening shortness of breath. She is undergoing injections to reduce the size of her keloid scar.  I saw [name] back in the office today. She's been undergoing rehabilitation and really wants to continue it. She's managed to lose some weight and feels better. Recently though she's been having increased blood pressures. Does have ranged up to 147 systolic. She had a small increase in her lisinopril to 10 mg daily with an improvement however this staff does not want to continue her at rehabilitation until her blood pressure is better controlled. She denies any chest pain or palpitations.  [name] returns today for follow-up. She continues in cardiac rehabilitation and is doing well. At his last office visit I increased her lisinopril to 20 mg daily and her blood pressure is now much better controlled. Overall she is feeling well. She is undergoing cesarean injections of the keloid scar that she has in her bypass site. She is concerned about a dilated varicose vein that she's developed at the inferior margin.  Since I last saw her, she is doing well. She underwent breast reductions surgery, which has helped her CABG scar some, but has failed to lose significant weight. BMI is now over 50. She does get short of breath but not worsening. Blood pressure is well-controlled and cough has gone away after switching from ACE-I to ARB. She reports slightly more palpitations recently. She has episodes of palpitations, worse at night that occur 3-4x a month.  07/01/2016  [name] returns today for follow-up. She reports some left leg swelling. She  has a history of left knee surgery and has had swelling since that time. She occasionally gets some swelling in the right leg which is where her saphenous vein grafts were taken. She still reports numbness and tingling around the vein harvest sites. She has had numbness and tingling of the chest which is  exquisitely tender to palpation although she has a "high pain tolerance". She recently underwent revision of the keloid scar to her median sternotomy. That does appear somewhat improved. She denies any worsening shortness of breath. Weight is stable.  03/21/2017  [name] was seen today in follow-up. She recently saw Tracy Bayley, NP, for evaluation of chest pain. Given her history of coronary artery disease and prior CABG, he was concerned about recurrent ischemia. He ordered a [employer] which she underwent and it turns out was negative for ischemia. I do think a lot of her pain is related to her anterior chest keloid scar. She's also having problems with her knees which aren't about the importance of weight loss today. Recently she's lost a little bit away however she is still at a BMI around 50. Recently her blood pressure is running somewhat higher and her PCP increased her irbesartan from 150 mg to 300 mg daily.  PMHx:  Past Medical History:  Diagnosis Date  . Acute urinary retention s/p Foley 01/07/2012  . CAD (coronary artery disease)    a. 12/2013 s/p CABG x 5 Dr. Darcey Nora (LIMA to LAD, SVG to diagonal, SVG to OM1, SVG to OM2, SVG to PDA)  . Carotid arterial disease ([place])    a. 12/2013 Carotid U/S: 1-39% bilat ICA stenosis.  . Carpal tunnel syndrome, bilateral   . Hemorrhoids, internal, with bleeding & prolapse 12/13/2011  . Hernia, abdominal   . History of kidney stones   . Hypertension   . Iron deficiency 12-07-2011  . Keloid scar    a. Sternal Keloid s/p CABG with ongoing pain.  . Morbid obesity ([place])   . Neuropathy   . Osteoarthritis   . Osteoporosis   . Seasonal allergies   . Sleep apnea    no CPAP machine; sleep study 02/2010 REM AHI 61.7/hr, total sleep REM 14.8/hr  . Uterine cancer ([place]) 7/15   clinical stage IA grade 1 endometrioid endometrial cancer    Past Surgical History:  Procedure Laterality Date  . ABDOMINAL HYSTERECTOMY    . BREAST REDUCTION SURGERY  Bilateral 10/10/2015   Procedure: BILATERAL MAMMARY REDUCTION  (BREAST) WITH FREE NIPPLE GRAFT;  Surgeon: Irene Limbo, MD;  Location: [facility];  Service: Plastics;  Laterality: Bilateral;  . BREAST SURGERY     reduction  . CARDIAC CATHETERIZATION    . COLONOSCOPY  2009  . COLONOSCOPY W/ BIOPSIES AND POLYPECTOMY    . CORONARY ARTERY BYPASS GRAFT N/A 01/09/2014   Procedure: CORONARY ARTERY BYPASS GRAFTING (CABG) x 5 using left internal mammary artery and right leg greater saphenous vein harvested endoscopically;  Surgeon: Ivin Poot, MD;  Location: [facility];  Service: Open Heart Surgery;  Laterality: N/A;  please use bed extenders and breast binder  . INTRAOPERATIVE TRANSESOPHAGEAL ECHOCARDIOGRAM N/A 01/09/2014   Procedure: INTRAOPERATIVE TRANSESOPHAGEAL ECHOCARDIOGRAM;  Surgeon: Ivin Poot, MD;  Location: [facility];  Service: Open Heart Surgery;  Laterality: N/A;  . KNEE ARTHROSCOPY  1991  . KNEE SURGERY Bilateral 1999  . LEFT HEART CATHETERIZATION WITH CORONARY ANGIOGRAM N/A 01/04/2014   Procedure: LEFT HEART CATHETERIZATION WITH CORONARY ANGIOGRAM;  Surgeon: Sinclair Grooms, MD;  Location:  [facility];  Service: Cardiovascular;  Laterality: N/A;  . LESION EXCISION WITH COMPLEX REPAIR N/A 05/07/2016   Procedure: COMPLEX REPAIR OF CHEST 16 CM;  Surgeon: Irene Limbo, MD;  Location: [facility];  Service: Plastics;  Laterality: N/A;  . MULTIPLE EXTRACTIONS WITH ALVEOLOPLASTY N/A 04/11/2014   Procedure: Extraction of tooth #'s 1,2,8,16 with alveoloplasty, maxillary tuberosity reductions, and gross debridement of remaining teeth.;  Surgeon: Lenn Cal, DDS;  Location: [facility];  Service: Oral Surgery;  Laterality: N/A;  . MULTIPLE TOOTH EXTRACTIONS    . NM MYOCAR PERF WALL MOTION  01/2010   dipyridamole myoview - moderate perfusion defect in basal inferoseptal, basal inferior, mid inferoseptal, mid inferior, apical inferior region; EF 56%  . TEE WITHOUT CARDIOVERSION  01/2010   EF 60-65%, small,  flat, non-infiltrating, calcified, fixed apical/septal mass  . TOTAL KNEE ARTHROPLASTY Left 04/29/2011  . transanal hemorrhoidal dearterliaization  01/06/12   with external hemorrhoid removal  . UNILATERAL SALPINGECTOMY Right 06/03/2014   Procedure: UNILATERAL SALPINGECTOMY;  Surgeon: Lavonia Drafts, MD;  Location: [facility];  Service: Gynecology;  Laterality: Right;  Marland Kitchen VAGINAL HYSTERECTOMY N/A 06/03/2014   Procedure: HYSTERECTOMY VAGINAL;  Surgeon: Lavonia Drafts, MD;  Location: [facility];  Service: Gynecology;  Laterality: N/A;  . WISDOM TOOTH EXTRACTION      FAMHx:  Family History  Problem Relation Age of Onset  . Heart attack Mother 27  . Heart disease Mother   . Hypertension Mother   . Diabetes Mother   . Hypertension Sister   . Heart failure Sister   . Heart disease Sister   . Diabetes Sister   . Kidney disease Brother     hypertension  . Hypertension Daughter   . Colon cancer Neg Hx     SOCHx:   reports that she has never smoked. She has never used smokeless tobacco. She reports that she does not drink alcohol or use drugs.  ALLERGIES:  Allergies  Allergen Reactions  . Cephalosporins Anaphylaxis, Swelling and Other (See Comments)    Tongue swelling, gum pain  . Eicosapentaenoic Acid (Epa) Anaphylaxis and Shortness Of Breath  . Fish-Derived Products Anaphylaxis and Shortness Of Breath  . Peanuts [Peanut Oil] Anaphylaxis, Shortness Of Breath and Other (See Comments)    Peanut butter  . Shrimp [Shellfish Allergy] Anaphylaxis    Pt states her throat will swell if she eats shrimp.  . Latex Itching  . Metronidazole Other (See Comments)    Palpitations, mild SOB, metallic taste, dry mouth, high blood pressure  . Ciprofloxacin Other (See Comments)    Gaging and achy  . Lisinopril Cough       . Naproxen Nausea Only and Other (See Comments)    Headache  . Other Itching and Other (See Comments)     All pain meds make her itch - has to have something to prevent  that in addition to receiving med  . Prednisone Other (See Comments)    Heart beating fast       ROS: Pertinent items noted in HPI and remainder of comprehensive ROS otherwise negative.  HOME MEDS: Current Outpatient Prescriptions  Medication Sig Dispense Refill  . albuterol (PROVENTIL HFA;VENTOLIN HFA) 108 (90 BASE) MCG/ACT inhaler Inhale 2 puffs into the lungs every 6 (six) hours as needed for wheezing or shortness of breath. 1 Inhaler 2  . aspirin 81 MG chewable tablet Chew 81 mg by mouth daily.    . cycloSPORINE (RESTASIS) 0.05 % ophthalmic emulsion Place 1 drop into both  eyes 2 (two) times daily.    Marland Kitchen erythromycin ophthalmic ointment Place a 1/2 inch ribbon of ointment into the lower eyelid twice daily x 10 days 1 g 0  . esomeprazole (NEXIUM) 40 MG capsule Take 1 capsule (40 mg total) by mouth daily. 30 capsule 2  . ipratropium (ATROVENT) 0.06 % nasal spray Place 2 sprays into both nostrils 4 (four) times daily. (Patient taking differently: Place 2 sprays into both nostrils 4 (four) times daily as needed for rhinitis. ) 15 mL 1  . irbesartan (AVAPRO) 300 MG tablet Take 1 tablet (300 mg total) by mouth daily. 90 tablet 3  . meloxicam (MOBIC) 15 MG tablet Take 15 mg by mouth daily as needed for pain.   2  . metoprolol tartrate (LOPRESSOR) 25 MG tablet TAKE 1.5 TABLETS BY MOUTH TWICE A DAY 270 tablet 3  . Multiple Vitamin (MULTIVITAMIN WITH MINERALS) TABS tablet Take 1 tablet by mouth every Monday, Wednesday, and Friday.    . rosuvastatin (CRESTOR) 20 MG tablet TAKE 1 TABLET BY MOUTH EVERY DAY 30 tablet 6   No current facility-administered medications for this visit.     LABS/IMAGING: No results found for this or any previous visit (from the past 48 hour(s)). No results found.  VITALS: BP 132/77   Pulse 77   Ht 5\' 8"  (1.727 m)   Wt (!) 324 lb (147 kg)   LMP 01/13/2014 Comment: spotting in Feb and bleeding since April  BMI 49.26 kg/m   EXAM: General appearance: alert and  no distress Neck: no carotid bruit and no JVD Lungs: clear to auscultation bilaterally Heart: regular rate and rhythm, S1, S2 normal, no murmur, click, rub or gallop Abdomen: soft, non-tender; bowel sounds normal; no masses,  no organomegaly and morbidly obese Extremities: extremities normal, atraumatic, no cyanosis or edema Pulses: 2+ and symmetric Skin: Keloid scar of the midsternal incision site with a dilated varicose vein at the distal aspect Neurologic: Grossly normal  EKG: Deferred  ASSESSMENT: 1. Coronary artery disease status post 5 vessel CABG (2015) -low risk Myoview 02/2017 2. Morbid obesity 3. Hypertension - uncontrolled 4. Dyslipidemia 5. OSA, not on cPAP 6. Keloid scar - s/p breast reduction, possible upcoming surgery for scar revision 7. Palpitations 8. Leg edema  PLAN: 1.   Ms. Haaland had recent chest pain which is somewhat atypical however was similar to before her bypass. Repeat stress testing was negative for ischemia. We'll continue to talk about weight loss and treatment for a blood pressure and sleep apnea. She continues to have problems with the keloid scar may need to visit with the dermatologic surgeons/plastic surgeon for scar revision or injections. She is advised to purchase a blood pressure cuff and monitor blood pressures at home to make sure she is achieved goal blood pressure on her new dose medication. Follow-up with me in 6 months or sooner as necessary.  [name], MD, [facility] Attending Cardiologist Sartell C Rekisha Welling 03/21/2017, 3:44 PM

## 2017-03-25 ENCOUNTER — Telehealth: Payer: Self-pay | Admitting: Internal Medicine

## 2017-03-25 NOTE — Telephone Encounter (Signed)
Dr. Debara Pickett would like patient to have fasting lab work to check cholesterol. She is agreeable to this. Lab order mailed to patient.

## 2017-04-29 ENCOUNTER — Telehealth: Payer: Self-pay | Admitting: Internal Medicine

## 2017-04-29 NOTE — Telephone Encounter (Signed)
[name] is calling in reference to her blood pressure medication . It was changed and she states that it is to strong now . Please call.  Thanks

## 2017-04-29 NOTE — Telephone Encounter (Signed)
Returned call to patient She states she is fatigued, jittery from increased dose of irbesartan  Per MD note 03/21/17: PCP increased her irbesartan from 150 mg to 300 mg daily BP was running 902X systolic - currently running [id number] systolic She was under a lot of stress at time of appt She is "better now" She is eating better, staying hydrated Patient states she now has a cough and dry mouth   Informed patient that her PCP increased irbesartan dose on 03/21/17. She states she is due for follow up w/PCP  Advised a BP clinic appt w/pharmacy staff in our office would be a good next step to evaluate/manage her BP. She agreed w/plan.   Will route to MD/pharm staff as Juluis Rainier

## 2017-05-12 ENCOUNTER — Telehealth: Payer: Self-pay

## 2017-05-12 NOTE — Telephone Encounter (Signed)
Dr. Havery Moros  [name] MRN: [id number] is a recall patient of Dr. Kelby Fam. [name] has a history of Tubular Adenomatous polyps.  She is scheduled for PV on 05/19/17 and a colonoscopy on 06/02/17.   In reviewing her medical history the patient weights 324 lbs and her BMI is 49.4 as of 03/21/17.  It has been higher in the past. The patient was seen in cardiology on 03/21/17 for chest wall pain. Does this patient need to be seen in the office prior to procedure? And should she be scheduled for a hospital procedure?  Please advise. Thanks!   Riki Sheer, LPN

## 2017-05-12 NOTE — Telephone Encounter (Signed)
OK to proceed for direct colonoscopy at [facility] per Dr. Havery Moros.   Riki Sheer, LPN

## 2017-05-12 NOTE — Telephone Encounter (Signed)
She had a negative stress test per cardiology, they don't think her pain is ischemic.  As long as BMI is < 50 and she is under 350 lbs can direct book for colonoscopy to be done at the [facility]. Thanks

## 2017-05-20 ENCOUNTER — Ambulatory Visit (AMBULATORY_SURGERY_CENTER): Payer: Self-pay

## 2017-05-20 VITALS — Ht 68.5 in | Wt 324.0 lb

## 2017-05-20 DIAGNOSIS — Z860100 Personal history of colon polyps, unspecified: Secondary | ICD-10-CM

## 2017-05-20 MED ORDER — SUPREP BOWEL PREP KIT 17.5-3.13-1.6 GM/177ML PO SOLN: 1.0000 | Freq: Once | ORAL | 0 refills | Status: AC

## 2017-05-20 NOTE — Progress Notes (Signed)
No allergies to eggs or soy No diet meds No home oxygen ANESTHESIA MAKES HER ITCH  Received Benadryl in Recovery Room in times past  Registered emmi

## 2017-05-22 ENCOUNTER — Other Ambulatory Visit: Payer: Self-pay | Admitting: Internal Medicine

## 2017-05-23 ENCOUNTER — Encounter: Payer: Self-pay | Admitting: Student in an Organized Health Care Education/Training Program

## 2017-05-23 ENCOUNTER — Ambulatory Visit (INDEPENDENT_AMBULATORY_CARE_PROVIDER_SITE_OTHER): Payer: Medicare Other | Admitting: Student in an Organized Health Care Education/Training Program

## 2017-05-23 VITALS — BP 147/57 | HR 55 | Temp 98.2°F | Wt 324.5 lb

## 2017-05-23 DIAGNOSIS — Z6841 Body Mass Index (BMI) 40.0 and over, adult: Secondary | ICD-10-CM

## 2017-05-23 DIAGNOSIS — M1711 Unilateral primary osteoarthritis, right knee: Secondary | ICD-10-CM | POA: Diagnosis not present

## 2017-05-23 DIAGNOSIS — M25461 Effusion, right knee: Secondary | ICD-10-CM

## 2017-05-23 DIAGNOSIS — Z96652 Presence of left artificial knee joint: Secondary | ICD-10-CM

## 2017-05-23 DIAGNOSIS — E669 Obesity, unspecified: Secondary | ICD-10-CM | POA: Diagnosis not present

## 2017-05-23 DIAGNOSIS — I10 Essential (primary) hypertension: Secondary | ICD-10-CM

## 2017-05-23 DIAGNOSIS — G4733 Obstructive sleep apnea (adult) (pediatric): Secondary | ICD-10-CM

## 2017-05-23 DIAGNOSIS — Z79899 Other long term (current) drug therapy: Secondary | ICD-10-CM | POA: Diagnosis not present

## 2017-05-23 LAB — SYNOVIAL CELL COUNT + DIFF, W/ CRYSTALS
CRYSTALS FLUID: NONE SEEN
EOSINOPHILS-SYNOVIAL: NONE SEEN % (ref 0–1)
LYMPHOCYTES-SYNOVIAL FLD: 23 % — AB (ref 0–20)
MONOCYTE-MACROPHAGE-SYNOVIAL FLUID: 71 % (ref 50–90)
Neutrophil, Synovial: 5 % (ref 0–25)
WBC, SYNOVIAL: 330 /mm3 — AB (ref 0–200)

## 2017-05-23 MED ORDER — IRBESARTAN 150 MG PO TABS: 300.0000 mg | ORAL_TABLET | Freq: Every day | ORAL | 3 refills | Status: DC

## 2017-05-23 NOTE — Patient Instructions (Signed)
Knee Injection, Care After  Refer to this sheet in the next few weeks. These instructions provide you with information about caring for yourself after your procedure. Your health care provider may also give you more specific instructions. Your treatment has been planned according to current medical practices, but problems sometimes occur. Call your health care provider if you have any problems or questions after your procedure.  What can I expect after the procedure?  After the procedure, it is common to have:   Soreness.   Warmth.   Swelling.    You may have more pain, swelling, and warmth than you did before the injection. This reaction may last for about one day.  Follow these instructions at home:  Bathing   If you were given a bandage (dressing), keep it dry until your health care provider says it can be removed. Ask your health care provider when you can start showering or taking a bath.  Managing pain, stiffness, and swelling   If directed, apply ice to the injection area:  ? Put ice in a plastic bag.  ? Place a towel between your skin and the bag.  ? Leave the ice on for 20 minutes, 2-3 times per day.   Do not apply heat to your knee.   Raise the injection area above the level of your heart while you are sitting or lying down.  Activity   Avoid strenuous activities for as long as directed by your health care provider. Ask your health care provider when you can return to your normal activities.  General instructions   Take medicines only as directed by your health care provider.   Do not take aspirin or other over-the-counter medicines unless your health care provider says you can.   Check your injection site every day for signs of infection. Watch for:  ? Redness, swelling, or pain.  ? Fluid, blood, or pus.   Follow your health care provider's instructions about dressing changes and removal.  Contact a health care provider if:   You have symptoms at your injection site that last longer than  two days after your procedure.   You have redness, swelling, or pain in your injection area.   You have fluid, blood, or pus coming from your injection site.   You have warmth in your injection area.   You have a fever.   Your pain is not controlled with medicine.  Get help right away if:   Your knee turns very red.   Your knee becomes very swollen.   Your knee pain is severe.  This information is not intended to replace advice given to you by your health care provider. Make sure you discuss any questions you have with your health care provider.  Document Released: 11/29/2014 Document Revised: 07/14/2016 Document Reviewed: 09/18/2014  Elsevier Interactive Patient Education  2018 [employer]

## 2017-05-23 NOTE — Assessment & Plan Note (Signed)
Blood pressure elevated today. She reports having new side effects to irbesartan including chronic non-productive daily cough in addition to itching and fatigue, which she says all started after we increased the dose of irbesartan. She says she did not have these problems at lower doses. I recommended we start a second agent like amlodipine to better control her hypertension, but she declined. Would rather check home readings and will call me if systolics are consistently above 140 over the next few weeks. Will decrease Irbesartan to 150mg  daily given her perceived side effects, though I am not sure they are causal. Continue metoprolol.

## 2017-05-23 NOTE — Assessment & Plan Note (Signed)
Right knee has significant effusion and tricompartmental OA. She was previously offered knee replacement but delayed that decision. Now she tells me her cardiologist recommended against that kind of elective surgery. She uses Meloxicam 1-2 times weekly, usually on weekends when her activity is highest. On POCUS exam she has a moderate sized effusion in the lateral suprapatellar pouch. After discussing options, we performed an arthrocentesis, removed 50 ml of synovial fluid, and injected steroids for symptomatic relief. Can repeat every 3-4 months.   Knee Arthrocentesis with Injection Procedure Note  Diagnosis: right knee osteoarthritis  Indications: Symptomatic relief of large effusion  Anesthesia: Lidocaine 1% without epinephrine  Procedure Details   Point of care ultrasound was used to identify the joint effusion and plan needle trajectory and depth. Consent was obtained for the procedure. The joint was prepped with Betadine. An 18 gauge needle was inserted into the superior aspect of the joint from a lateral approach to access the suprapatellar pouch. 50 ml of clear yellow fluid was removed from the joint and sent to the lab for analysis. 2 ml 1% lidocaine and 40 mg of Triamcinolone was then injected into the joint through the same needle. The needle was removed and the area cleansed and dressed.  Complications:  None; patient tolerated the procedure well.  Long axis view of the right knee showing a moderate effusion in the suprapatellar pouch.      Short axis view over the lateral superior knee which is were most of the fluid has accumulated. This was the location of our arthrocentesis.

## 2017-05-23 NOTE — Progress Notes (Signed)
   Assessment and Plan:  See Encounters tab for problem-based medical decision making.   __________________________________________________________  HPI:  61 year old woman here for follow up of hypertension. She reports good blood pressure readings at home, and good medication compliance. However she thinks she has developed a new daily dry cough since increasing irbesartan, in addition also having more diffuse itching, and fatigue which she thinks is also due to higher dose of this medicine. No chest pain or DOE. She does still have itching at her sternal scar. Eating and drinking well, trying to improve diet. Has a colonoscopy scheduled. Right knee pain is stable. She is walking with a cane today. Says the knee is painful with activity, and stiff after walking. Also thinks it is more swollen currently. She uses meloxicam usually on Saturday when she is most active, and to help with stiffness during church services on Sunday. She had a successful knee replacement on the left, though she thinks that her cardiologist does not want her to undergo any further surgeries. No recent hospitalization, ED visits, or urgent care visits.  __________________________________________________________  Problem List: Patient Active Problem List   Diagnosis Date Noted  . Atherosclerosis of aorta ([place]) 06/20/2015    Priority: High  . Coronary atherosclerosis of native coronary artery 01/05/2014    Priority: High  . Osteoarthritis of right knee 11/07/2014    Priority: Medium  . Morbid obesity due to excess calories ([place]) 05/10/2012    Priority: Medium  . Obstructive sleep apnea 03/27/2010    Priority: Medium  . Essential hypertension 06/30/2007    Priority: Medium  . Health care maintenance 10/09/2014    Priority: Low  . GERD 08/06/2008    Priority: Low  . Hyperlipidemia 10/31/2007    Priority: Low    Medications: Reconciled today in  Epic __________________________________________________________  Physical Exam:  Vital Signs: Vitals:   05/23/17 1136  BP: (!) 147/57  Pulse: (!) 55  Temp: 98.2 F (36.8 C)  TempSrc: Oral  SpO2: 100%  Weight: (!) 324 lb 8 oz (147.2 kg)    Gen: Well appearing, NAD CV: RRR, no murmurs, well healed mid sternal scar Pulm: Normal effort, CTA throughout, no wheezing Abd: Soft, NT, ND, normal BS.  Ext: Warm, no edema, left knee with midline scar from knee replacement surgery. Right knee is obese, there is a moderate bulging effusion laterally, no joint laxity, negative lachmans, minimal anterior crepitus, no warmth or erythema.  Skin: No atypical appearing moles. No rashes

## 2017-05-23 NOTE — Addendum Note (Signed)
Addended by: Lalla Brothers T on: 05/23/2017 01:24 PM   Modules accepted: Orders

## 2017-05-23 NOTE — Assessment & Plan Note (Signed)
Diagnosed in 2011 on sleep study, but CPAP titration never took place. She reports sleeping well, but with prominent heart disease and persistent obesity I strongly recommended that we finish the sleep study protocol. I have placed a new referral for sleep study to hopefully initiate CPAP.

## 2017-05-26 ENCOUNTER — Telehealth: Payer: Self-pay | Admitting: Student in an Organized Health Care Education/Training Program

## 2017-05-26 NOTE — Telephone Encounter (Signed)
I called [name] to check up on her response to the right knee steroid injection we did a few days ago. She reports the pain is now gone. She has been resting. I advised for her to advance activity to normal now. We will see how long she has a benefit. We can offer injections every 3-4 months, and potentially more frequent arthrocentesis if she reaccumulates the joint effusion quickly.

## 2017-06-02 ENCOUNTER — Ambulatory Visit (AMBULATORY_SURGERY_CENTER): Payer: Medicare Other | Admitting: Gastroenterology

## 2017-06-02 ENCOUNTER — Encounter: Payer: Self-pay | Admitting: Gastroenterology

## 2017-06-02 VITALS — BP 124/60 | HR 56 | Temp 97.3°F | Resp 12 | Ht 68.5 in | Wt 324.0 lb

## 2017-06-02 DIAGNOSIS — I2510 Atherosclerotic heart disease of native coronary artery without angina pectoris: Secondary | ICD-10-CM | POA: Diagnosis not present

## 2017-06-02 DIAGNOSIS — D123 Benign neoplasm of transverse colon: Secondary | ICD-10-CM

## 2017-06-02 DIAGNOSIS — D124 Benign neoplasm of descending colon: Secondary | ICD-10-CM | POA: Diagnosis not present

## 2017-06-02 DIAGNOSIS — Z86010 Personal history of colonic polyps: Secondary | ICD-10-CM | POA: Diagnosis not present

## 2017-06-02 DIAGNOSIS — D129 Benign neoplasm of anus and anal canal: Secondary | ICD-10-CM

## 2017-06-02 DIAGNOSIS — D128 Benign neoplasm of rectum: Secondary | ICD-10-CM | POA: Diagnosis not present

## 2017-06-02 MED ORDER — SODIUM CHLORIDE 0.9 % IV SOLN: 500.0000 mL | INTRAVENOUS | Status: DC

## 2017-06-02 NOTE — Progress Notes (Signed)
Report to PACU, RN, vss, BBS= Clear.  

## 2017-06-02 NOTE — Progress Notes (Signed)
Called to room to assist during endoscopic procedure.  Patient ID and intended procedure confirmed with present staff. Received instructions for my participation in the procedure from the performing physician.  

## 2017-06-02 NOTE — Patient Instructions (Signed)
YOU HAD AN ENDOSCOPIC PROCEDURE TODAY AT [facility]:   Refer to the procedure report that was given to you for any specific questions about what was found during the examination.  If the procedure report does not answer your questions, please call your gastroenterologist to clarify.  If you requested that your care partner not be given the details of your procedure findings, then the procedure report has been included in a sealed envelope for you to review at your convenience later.  YOU SHOULD EXPECT: Some feelings of bloating in the abdomen. Passage of more gas than usual.  Walking can help get rid of the air that was put into your GI tract during the procedure and reduce the bloating. If you had a lower endoscopy (such as a colonoscopy or flexible sigmoidoscopy) you may notice spotting of blood in your stool or on the toilet paper. If you underwent a bowel prep for your procedure, you may not have a normal bowel movement for a few days.  Please Note:  You might notice some irritation and congestion in your nose or some drainage.  This is from the oxygen used during your procedure.  There is no need for concern and it should clear up in a day or so.  SYMPTOMS TO REPORT IMMEDIATELY:   Following lower endoscopy (colonoscopy or flexible sigmoidoscopy):  Excessive amounts of blood in the stool  Significant tenderness or worsening of abdominal pains  Swelling of the abdomen that is new, acute  Fever of 100F or higher    For urgent or emergent issues, a gastroenterologist can be reached at any hour by calling [phone].  Please read all handouts given to you by your recovery nurse today. No NSAIDS, ibuprofen, advil or aspirin for 2 weeks.   DIET:  We do recommend a small meal at first, but then you may proceed to your regular diet.  Drink plenty of fluids but you should avoid alcoholic beverages for 24 hours.  ACTIVITY:  You should plan to take it easy for the rest of  today and you should NOT DRIVE or use heavy machinery until tomorrow (because of the sedation medicines used during the test).    FOLLOW UP: Our staff will call the number listed on your records the next business day following your procedure to check on you and address any questions or concerns that you may have regarding the information given to you following your procedure. If we do not reach you, we will leave a message.  However, if you are feeling well and you are not experiencing any problems, there is no need to return our call.  We will assume that you have returned to your regular daily activities without incident.  If any biopsies were taken you will be contacted by phone or by letter within the next 1-3 weeks.  Please call us at [phone] if you have not heard about the biopsies in 3 weeks.    SIGNATURES/CONFIDENTIALITY: You and/or your care partner have signed paperwork which will be entered into your electronic medical record.  These signatures attest to the fact that that the information above on your After Visit Summary has been reviewed and is understood.  Full responsibility of the confidentiality of this discharge information lies with you and/or your care-partner.  Thank you for letting us take care of your healthcare needs today.

## 2017-06-02 NOTE — Op Note (Signed)
[name]: [name] Procedure Date: 06/02/2017 9:36 AM MRN: [id number] Endoscopist: Remo Lipps P. Armbruster MD, MD Age: 61 Referring MD:  Date of Birth: 01-31-1956 Gender: Female Account #: [id number] Procedure:                Colonoscopy Indications:              Surveillance: Personal history of adenomatous                            polyps on last colonoscopy 5 years ago Medicines:                Monitored Anesthesia Care Procedure:                Pre-Anesthesia Assessment:                           - Prior to the procedure, a History and Physical                            was performed, and patient medications and                            allergies were reviewed. The patient's tolerance of                            previous anesthesia was also reviewed. The risks                            and benefits of the procedure and the sedation                            options and risks were discussed with the patient.                            All questions were answered, and informed consent                            was obtained. Prior Anticoagulants: The patient has                            taken aspirin, last dose was 1 day prior to                            procedure. ASA Grade Assessment: III - A patient                            with severe systemic disease. After reviewing the                            risks and benefits, the patient was deemed in                            satisfactory condition to undergo the procedure.  After obtaining informed consent, the colonoscope                            was passed under direct vision. Throughout the                            procedure, the patient's blood pressure, pulse, and                            oxygen saturations were monitored continuously. The                            Colonoscope was introduced through the anus and                            advanced to the  the cecum, identified by                            appendiceal orifice and ileocecal valve. The                            colonoscopy was performed without difficulty. The                            patient tolerated the procedure well. The quality                            of the bowel preparation was good. The ileocecal                            valve, appendiceal orifice, and rectum were                            photographed. Scope In: 9:38:58 AM Scope Out: 9:59:25 AM Scope Withdrawal Time: 0 hours 14 minutes 33 seconds  Total Procedure Duration: 0 hours 20 minutes 27 seconds  Findings:                 The perianal and digital rectal examinations were                            normal.                           A 4 mm polyp was found in the transverse colon. The                            polyp was sessile. The polyp was removed with a                            cold snare. Resection and retrieval were complete.                           A 3 mm polyp was found in the descending colon. The  polyp was sessile. The polyp was removed with a                            cold snare. Resection and retrieval were complete.                           A 3 mm polyp was found in the rectum. The polyp was                            sessile. The polyp was removed with a cold snare.                            Resection and retrieval were complete.                           A few small-mouthed diverticula were found in the                            sigmoid colon.                           he exam was otherwise without abnormality. Scarring                            from prior hemorrhoidectomy noted in the rectum. Complications:            No immediate complications. Estimated blood loss:                            Minimal. Estimated Blood Loss:     Estimated blood loss was minimal. Impression:               - One 4 mm polyp in the transverse colon, removed                             with a cold snare. Resected and retrieved.                           - One 3 mm polyp in the descending colon, removed                            with a cold snare. Resected and retrieved.                           - One 3 mm polyp in the rectum, removed with a cold                            snare. Resected and retrieved.                           - Diverticulosis in the sigmoid colon.                           - The examination was otherwise normal.  Recommendation:           - Patient has a contact number available for                            emergencies. The signs and symptoms of potential                            delayed complications were discussed with the                            patient. Return to normal activities tomorrow.                            Written discharge instructions were provided to the                            patient.                           - Resume previous diet.                           - Continue present medications.                           - Await pathology results.                           - Repeat colonoscopy is recommended for                            surveillance. The colonoscopy date will be                            determined after pathology results from today's                            exam become available for review.                           - No ibuprofen, naproxen, or other non-steroidal                            anti-inflammatory drugs for 2 weeks after polyp                            removal. Remo Lipps P. Armbruster MD, MD 06/02/2017 10:03:56 AM This report has been signed electronically.

## 2017-06-03 ENCOUNTER — Telehealth: Payer: Self-pay | Admitting: *Deleted

## 2017-06-03 NOTE — Telephone Encounter (Signed)
  Follow up Call-  Call back number 06/02/2017  Post procedure Call Back phone  # [phone]  Permission to leave phone message Yes  Some recent data might be hidden     Patient questions:  Do you have a fever, pain , or abdominal swelling? No. Pain Score  0 *  Have you tolerated food without any problems? Yes.    Have you been able to return to your normal activities? Yes.    Do you have any questions about your discharge instructions: Diet   No. Medications  No. Follow up visit  No.  Do you have questions or concerns about your Care? No.  Actions: * If pain score is 4 or above: No action needed, pain <4.

## 2017-06-10 ENCOUNTER — Encounter: Payer: Self-pay | Admitting: Gastroenterology

## 2017-06-21 ENCOUNTER — Other Ambulatory Visit: Payer: Self-pay | Admitting: Student in an Organized Health Care Education/Training Program

## 2017-07-13 ENCOUNTER — Ambulatory Visit (HOSPITAL_BASED_OUTPATIENT_CLINIC_OR_DEPARTMENT_OTHER)
Payer: Medicare Other | Attending: Student in an Organized Health Care Education/Training Program | Admitting: Internal Medicine

## 2017-07-13 VITALS — Ht 68.0 in | Wt 305.0 lb

## 2017-07-13 DIAGNOSIS — G4733 Obstructive sleep apnea (adult) (pediatric): Secondary | ICD-10-CM | POA: Diagnosis not present

## 2017-07-17 DIAGNOSIS — G4733 Obstructive sleep apnea (adult) (pediatric): Secondary | ICD-10-CM

## 2017-07-17 NOTE — Procedures (Signed)
  Patient Name: [name], [name] Date: 07/13/2017 Gender: Female D.O.B: 1956/09/26 Age (years): 61 Referring Provider: Axel Filler Height (inches): 54 Interpreting Physician: Baird Lyons MD, ABSM Weight (lbs): 305 RPSGT: Jorge Ny BMI: 46 MRN: [id number] Neck Size: 17.00 CLINICAL INFORMATION Sleep Study Type: NPSG  Indication for sleep study: Obesity, OSA  Epworth Sleepiness Score: 12  SLEEP STUDY TECHNIQUE As per the AASM Manual for the Scoring of Sleep and Associated Events v2.3 (April 2016) with a hypopnea requiring 4% desaturations.  The channels recorded and monitored were frontal, central and occipital EEG, electrooculogram (EOG), submentalis EMG (chin), nasal and oral airflow, thoracic and abdominal wall motion, anterior tibialis EMG, snore microphone, electrocardiogram, and pulse oximetry.  MEDICATIONS Medications self-administered by patient taken the night of the study : ROSUVASTATIN, METOPROLOL TARTRATE  SLEEP ARCHITECTURE The study was initiated at 10:28:22 PM and ended at 4:21:18 AM.  Sleep onset time was 2.9 minutes and the sleep efficiency was 63.0%. The total sleep time was 222.5 minutes.  Stage REM latency was 95.5 minutes.  The patient spent 4.94% of the night in stage N1 sleep, 41.12% in stage N2 sleep, 30.34% in stage N3 and 23.60% in REM.  Alpha intrusion was absent.  Supine sleep was 38.65%.  RESPIRATORY PARAMETERS The overall apnea/hypopnea index (AHI) was 7.3 per hour. There were 7 total apneas, including 7 obstructive, 0 central and 0 mixed apneas. There were 20 hypopneas and 11 RERAs.  The AHI during Stage REM sleep was 27.4 per hour.  AHI while supine was 6.3 per hour.  The mean oxygen saturation was 93.75%. The minimum SpO2 during sleep was 85.00%.  Soft snoring was noted during this study.  CARDIAC DATA The 2 lead EKG demonstrated sinus rhythm. The mean heart rate was 65.17 beats per minute. Other EKG findings  include: None.  LEG MOVEMENT DATA The total PLMS were 31 with a resulting PLMS index of 8.36. Associated arousal with leg movement index was 0.0 .  IMPRESSIONS - Mild obstructive sleep apnea occurred during this study (AHI = 7.3/h). - No significant central sleep apnea occurred during this study (CAI = 0.0/h). - Mild oxygen desaturation was noted during this study (Min O2 = 85.00%, Mean 93.7%). - The patient snored with Soft snoring volume. - No cardiac abnormalities were noted during this study. - Mild periodic limb movements of sleep occurred during the study. No significant associated arousals.  DIAGNOSIS - Obstructive Sleep Apnea (327.23 [G47.33 ICD-10])  RECOMMENDATIONS - Very mild obstructive sleep apnea. This may not address her complaint that she wakes early- 3:00-4:00 AM, which may be more of an insomnia or sleep schedule problem. - Be careful with  alcohol, sedatives and other CNS depressants that may worsen sleep apnea and disrupt normal sleep architecture. - Sleep hygiene should be reviewed to assess factors that may improve sleep quality. - Weight management and regular exercise should be initiated or continued if appropriate.  [Electronically signed] 07/17/2017 10:49 AM  Baird Lyons MD, ABSM Diplomate, American Board of Sleep Medicine   NPI: [id number]  [place], American Board of Sleep Medicine  ELECTRONICALLY SIGNED ON:  07/17/2017, 10:46 AM [place] PH: [phone]   FX: [phone] [place]

## 2017-08-19 ENCOUNTER — Encounter (INDEPENDENT_AMBULATORY_CARE_PROVIDER_SITE_OTHER): Payer: Self-pay

## 2017-08-19 ENCOUNTER — Ambulatory Visit (INDEPENDENT_AMBULATORY_CARE_PROVIDER_SITE_OTHER): Payer: Medicare Other | Admitting: Internal Medicine

## 2017-08-19 VITALS — BP 152/57 | HR 65 | Temp 97.9°F | Ht 68.0 in | Wt 319.9 lb

## 2017-08-19 DIAGNOSIS — Z96652 Presence of left artificial knee joint: Secondary | ICD-10-CM | POA: Diagnosis not present

## 2017-08-19 DIAGNOSIS — M1990 Unspecified osteoarthritis, unspecified site: Secondary | ICD-10-CM

## 2017-08-19 DIAGNOSIS — G629 Polyneuropathy, unspecified: Secondary | ICD-10-CM | POA: Insufficient documentation

## 2017-08-19 DIAGNOSIS — E669 Obesity, unspecified: Secondary | ICD-10-CM

## 2017-08-19 DIAGNOSIS — Z6841 Body Mass Index (BMI) 40.0 and over, adult: Secondary | ICD-10-CM

## 2017-08-19 DIAGNOSIS — Z79899 Other long term (current) drug therapy: Secondary | ICD-10-CM | POA: Diagnosis not present

## 2017-08-19 DIAGNOSIS — G4733 Obstructive sleep apnea (adult) (pediatric): Secondary | ICD-10-CM | POA: Diagnosis not present

## 2017-08-19 DIAGNOSIS — R2242 Localized swelling, mass and lump, left lower limb: Secondary | ICD-10-CM

## 2017-08-19 DIAGNOSIS — M7989 Other specified soft tissue disorders: Secondary | ICD-10-CM

## 2017-08-19 DIAGNOSIS — I10 Essential (primary) hypertension: Secondary | ICD-10-CM

## 2017-08-19 NOTE — Patient Instructions (Signed)
For your swelling, continue being active; wear compression stockings daily.   If the swelling worsens, you develop calf pain, redness, shortness of breath, chest pain, please come back to the clinic.  Please check your blood pressure daily at home and bring in your BP log to your next appointment with Dr. Evette Doffing.

## 2017-08-19 NOTE — Progress Notes (Signed)
   CC: left foot swelling  HPI:  [name] is a 61 y.o. with a PMH of HTN, OSA, OA, obesity, presenting to clinic for left foot swelling.  Patient endorses one week of left foot and ankle swelling. She states that since her L knee replacement, she has had intermittent left foot swelling that will usually resolve in a day or two, however, this has persisted for a week. She endorses that her left leg is usually bigger than the right since the surgery as well. She denies trauma, numbness, tingling, pain, swelling or redness of her calf/knee, fevers, chills, chest pain, shortness of breath.  Please see problem based Assessment and Plan for status of patients chronic conditions.  Past Medical History:  Diagnosis Date  . Acute urinary retention s/p Foley 01/07/2012  . CAD (coronary artery disease)    a. 12/2013 s/p CABG x 5 Dr. Darcey Nora (LIMA to LAD, SVG to diagonal, SVG to OM1, SVG to OM2, SVG to PDA)  . Carotid arterial disease ([place])    a. 12/2013 Carotid U/S: 1-39% bilat ICA stenosis.  . Carpal tunnel syndrome, bilateral   . Hemorrhoids, internal, with bleeding & prolapse 12/13/2011  . Hernia, abdominal   . History of blood transfusion    CABG and hysterectomy  . History of kidney stones   . Hypertension   . Iron deficiency 12-07-2011  . Keloid scar    a. Sternal Keloid s/p CABG with ongoing pain.  . Morbid obesity ([place])   . Myocardial infarct ([place])   . Neuropathy   . Osteoarthritis   . Osteoporosis   . Seasonal allergies   . Sleep apnea    no CPAP machine; sleep study 02/2010 REM AHI 61.7/hr, total sleep REM 14.8/hr  . Uterine cancer ([place]) 7/15   clinical stage IA grade 1 endometrioid endometrial cancer    Review of Systems:   ROS Per HPI  Physical Exam:  Vitals:   08/19/17 1006 08/19/17 1009  BP: (!) 170/74 (!) 152/57  Pulse: 65   Temp: 97.9 F (36.6 C)   TempSrc: Oral   SpO2: 100%   Weight: (!) 319 lb 14.4 oz (145.1 kg)   Height: 5\' 8"  (1.727 m)    GENERAL-  alert, co-operative, appears as stated age, not in any distress. HEENT- Atraumatic, normocephalic CARDIAC- RRR, no murmurs, rubs or gallops. RESP- Moving equal volumes of air, and clear to auscultation bilaterally, no wheezes or crackles. EXTREMITIES- PT pulse 2+, symmetric; LLE > RLE which patient states is baseline; mild dependent edema over dorsum of left foot with no associated bony point tenderness; no calf tenderness, erythema, or palpable cords, no L knee effusion or tenderness over bony prominence/popliteal area. SKIN- Warm, dry, no rash or lesion. PSYCH- Normal mood and affect, appropriate thought content and speech.  Assessment & Plan:   See Encounters Tab for problem based charting.   Patient discussed with Dr. Verdie Drown, MD Internal Medicine PGY2

## 2017-08-19 NOTE — Progress Notes (Deleted)
   CC: ***  HPI:  [name] is a 61 y.o. with a PMH of ***  Please see problem based Assessment and Plan for status of patients chronic conditions.  Past Medical History:  Diagnosis Date  . Acute urinary retention s/p Foley 01/07/2012  . CAD (coronary artery disease)    a. 12/2013 s/p CABG x 5 Dr. Darcey Nora (LIMA to LAD, SVG to diagonal, SVG to OM1, SVG to OM2, SVG to PDA)  . Carotid arterial disease ([place])    a. 12/2013 Carotid U/S: 1-39% bilat ICA stenosis.  . Carpal tunnel syndrome, bilateral   . Hemorrhoids, internal, with bleeding & prolapse 12/13/2011  . Hernia, abdominal   . History of blood transfusion    CABG and hysterectomy  . History of kidney stones   . Hypertension   . Iron deficiency 12-07-2011  . Keloid scar    a. Sternal Keloid s/p CABG with ongoing pain.  . Morbid obesity ([place])   . Myocardial infarct ([place])   . Neuropathy   . Osteoarthritis   . Osteoporosis   . Seasonal allergies   . Sleep apnea    no CPAP machine; sleep study 02/2010 REM AHI 61.7/hr, total sleep REM 14.8/hr  . Uterine cancer ([place]) 7/15   clinical stage IA grade 1 endometrioid endometrial cancer    Review of Systems:   ROS  Physical Exam:  Vitals:   08/19/17 1006 08/19/17 1009  BP: (!) 170/74 (!) 152/57  Pulse: 65   Temp: 97.9 F (36.6 C)   TempSrc: Oral   SpO2: 100%   Weight: (!) 319 lb 14.4 oz (145.1 kg)   Height: 5\' 8"  (1.727 m)    GENERAL- alert, co-operative, appears as stated age, not in any distress. HEENT- Atraumatic, normocephalic, PERRL, EOMI, oral mucosa appears moist CARDIAC- RRR, no murmurs, rubs or gallops. RESP- Moving equal volumes of air, and clear to auscultation bilaterally, no wheezes or crackles. ABDOMEN- Soft, nontender, bowel sounds present. NEURO- No obvious Cr N abnormality. EXTREMITIES- pulse 2+, symmetric, no pedal edema. SKIN- Warm, dry, No rash or lesion. PSYCH- Normal mood and affect, appropriate thought content and speech.  Assessment &  Plan:   See Encounters Tab for problem based charting.   Patient {GC/GE:[id number]::"discussed with","seen with"} Dr. {NAMES:[id number]::"Butcher","Granfortuna","E. Hoffman","Klima","Mullen","Narendra","Vincent"}   Alphonzo Grieve, MD Internal Medicine PGY2

## 2017-08-20 ENCOUNTER — Encounter: Payer: Self-pay | Admitting: Internal Medicine

## 2017-08-20 NOTE — Assessment & Plan Note (Signed)
Patient with one week history of left foot swelling in setting of increasing activity and exercise. Exam consistent with dependent edema.  Plan: --advised patient to elevated LE, consider using compression stockings --rtc if swelling does not resolve, worsens, or is associated with new symptoms.

## 2017-08-20 NOTE — Assessment & Plan Note (Signed)
Patient hypertensive today; she endorses compliance with irbesartan 150mg  daily and metoprolol 37.5mg  BID. She is hesitant about adjusting her regimen at this time. She is asymptomatic.  Plan: --advised patient to log her BP's daily and bring to her following appointment with PCP in couple of weeks --continue current regimen for now; I advised her that her meds may need to be adjusted at f/u visit

## 2017-08-21 ENCOUNTER — Other Ambulatory Visit: Payer: Self-pay | Admitting: Internal Medicine

## 2017-08-21 NOTE — Progress Notes (Signed)
Medicine attending: Medical history, presenting problems, physical findings, and medications, reviewed with resident physician Dr Gorica Svalina on the day of the patient visit and I concur with her evaluation and management plan. 

## 2017-09-05 ENCOUNTER — Ambulatory Visit (INDEPENDENT_AMBULATORY_CARE_PROVIDER_SITE_OTHER): Payer: Medicare Other | Admitting: Student in an Organized Health Care Education/Training Program

## 2017-09-05 ENCOUNTER — Encounter: Payer: Self-pay | Admitting: Student in an Organized Health Care Education/Training Program

## 2017-09-05 VITALS — BP 153/52 | HR 63 | Temp 98.1°F | Ht 68.0 in | Wt 320.2 lb

## 2017-09-05 DIAGNOSIS — M1711 Unilateral primary osteoarthritis, right knee: Secondary | ICD-10-CM | POA: Diagnosis not present

## 2017-09-05 DIAGNOSIS — Z23 Encounter for immunization: Secondary | ICD-10-CM

## 2017-09-05 DIAGNOSIS — M7989 Other specified soft tissue disorders: Secondary | ICD-10-CM | POA: Diagnosis not present

## 2017-09-05 DIAGNOSIS — E7439 Other disorders of intestinal carbohydrate absorption: Secondary | ICD-10-CM | POA: Diagnosis not present

## 2017-09-05 DIAGNOSIS — I10 Essential (primary) hypertension: Secondary | ICD-10-CM

## 2017-09-05 LAB — SYNOVIAL CELL COUNT + DIFF, W/ CRYSTALS
CRYSTALS FLUID: NONE SEEN
Lymphocytes-Synovial Fld: 18 % (ref 0–20)
MONOCYTE-MACROPHAGE-SYNOVIAL FLUID: 80 % (ref 50–90)
Neutrophil, Synovial: 2 % (ref 0–25)
WBC, SYNOVIAL: 340 /mm3 — AB (ref 0–200)

## 2017-09-05 LAB — POCT GLYCOSYLATED HEMOGLOBIN (HGB A1C): HEMOGLOBIN A1C: 4.8

## 2017-09-05 LAB — GLUCOSE, CAPILLARY: Glucose-Capillary: 112 mg/dL — ABNORMAL HIGH (ref 65–99)

## 2017-09-05 MED ORDER — IRBESARTAN 300 MG PO TABS: 300.0000 mg | ORAL_TABLET | Freq: Every day | ORAL | 3 refills | Status: DC

## 2017-09-05 MED ORDER — IRBESARTAN 150 MG PO TABS: 150.0000 mg | ORAL_TABLET | Freq: Two times a day (BID) | ORAL | 3 refills | Status: DC

## 2017-09-05 MED ORDER — IRBESARTAN 150 MG PO TABS: 300.0000 mg | ORAL_TABLET | Freq: Two times a day (BID) | ORAL | 3 refills | Status: DC

## 2017-09-05 NOTE — Patient Instructions (Signed)
1. Please take your medicines as prescribed.   2. We want to get your blood pressure below 140 (top number). We may need to add a second medicine in the future.   3. Come back in 3-4 weeks for [place] to look at your shoulder and see if an injection may help.

## 2017-09-05 NOTE — Assessment & Plan Note (Signed)
Random glucose 150 last year on labs. She is obese and hypertensive, so will rule out diabetes with A1c today.

## 2017-09-05 NOTE — Assessment & Plan Note (Signed)
Blood pressure elevated above goal today, she reports good compliance with irbesartan 150 mg once daily. I recommended adding on a second antihypertensive agent, the patient has an aversion to the idea of adding another medication. She is willing to accept irbesartan 150 mg twice a day. In the past she has had cough with going up to this dose, but she thinks maybe twice daily dosing will help. I advised that we should follow-up in 4 weeks and if her blood pressures remain elevated we should add chlorthalidone.

## 2017-09-05 NOTE — Assessment & Plan Note (Signed)
Patient with about one month of left foot discomfort. She reports numbness and tingling on the distal plantar side of her foot. There is only trace edema in the foot, she is doing a good job with compression. This could represent some amount of neuropathy. A1c was 4.8%, she does not have diabetes. I don't think it's a Morton neuroma she doesn't have any point tenderness. Plan is to refer to podiatry for evaluation and treatments.

## 2017-09-05 NOTE — Progress Notes (Signed)
   Assessment and Plan:  See Encounters tab for problem-based medical decision making.   __________________________________________________________  HPI:   61 year old woman here for follow-up of right knee osteoarthritis. She has a long history of arthritis and has been off her joint replacements, but unable to because of stable coronary artery disease. We did a arthrocentesis and injection of the right knee back in July and she reported significant benefit. Over the last few months she has been able to increase her activity. She has been walking almost daily at this points. She doesn't use the motorized buggies at the grocery store anymore. She feels slightly increased activity has helped her low back pain as well. Only the last 1-2 weeks she has noticed a slow increase in right knee pain, especially while walking up and down steps. She started using a cane to help her walk one day ago. Denies any fevers or chills. She feels like the knee is only modestly swollen.   Otherwise she is doing well. Reports good compliance with her medications. Denies any chest pain, dyspnea on exertion.She had a sleep study completed a few months ago which showed only mild disease so she has not pursued CPAP titration. She feels like her sleep quality is very good right now. No recent hospitalizations or emergency department visits. Lives by herself and is independent in all activities of daily living.  __________________________________________________________  Problem List: Patient Active Problem List   Diagnosis Date Noted  . Atherosclerosis of aorta ([place]) 06/20/2015    Priority: High  . Coronary atherosclerosis of native coronary artery 01/05/2014    Priority: High  . Osteoarthritis of right knee 11/07/2014    Priority: Medium  . Morbid obesity due to excess calories ([place]) 05/10/2012    Priority: Medium  . Obstructive sleep apnea 03/27/2010    Priority: Medium  . Essential hypertension 06/30/2007   Priority: Medium  . Health care maintenance 10/09/2014    Priority: Low  . GERD 08/06/2008    Priority: Low  . Hyperlipidemia 10/31/2007    Priority: Low  . Glucose intolerance 09/05/2017  . Swelling of left foot 08/19/2017    Medications: Reconciled today in Epic __________________________________________________________  Physical Exam:  Vital Signs: Vitals:   09/05/17 0927  BP: (!) 153/52  Pulse: 63  Temp: 98.1 F (36.7 C)  TempSrc: Oral  SpO2: 100%  Weight: (!) 320 lb 3.2 oz (145.2 kg)  Height: 5\' 8"  (1.727 m)    Gen: Well appearing, NAD ENT: OP clear without erythema or exudate.  Ext: Warm, no edema Skin: No atypical appearing moles. No rashes  POCUS of right knee showed moderate simple free flowing effusion in the suprapatellar pouch.

## 2017-09-05 NOTE — Assessment & Plan Note (Signed)
Patient with known tricompartmental osteoarthritis on the right, total knee replacement on the left. She responded very well2 arthrocentesis and steroid injection 3 months ago. Now with return of symptoms and reaccumulation of effusion. Plan is to continue managing with as needed arthrocentesis and steroid injections, which was provided today under ultrasound guidance.   Knee Arthrocentesis with Injection Procedure Note  Diagnosis: right knee osteoarthritis  Indications: Symptomatic relief of large effusion  Anesthesia: Lidocaine 1% without epinephrine  Procedure Details   Point of care ultrasound was used to identify the joint effusion and plan needle trajectory and depth. Consent was obtained for the procedure. The joint was prepped with Betadine. An 18 gauge needle was inserted into the superior aspect of the joint from a medial approach to access the suprapatellar pouch. 50 ml of clear yellow fluid was removed from the joint and sent to the lab for analysis. 2 ml 1% lidocaine and 40 mg of Triamcinolone was then injected into the joint through the same needle. The needle was removed and the area cleansed and dressed.  Complications:  None; patient tolerated the procedure well.

## 2017-09-06 ENCOUNTER — Telehealth: Payer: Self-pay | Admitting: *Deleted

## 2017-09-06 DIAGNOSIS — I10 Essential (primary) hypertension: Secondary | ICD-10-CM

## 2017-09-06 LAB — BMP8+ANION GAP
ANION GAP: 16 mmol/L (ref 10.0–18.0)
BUN/Creatinine Ratio: 13 (ref 12–28)
BUN: 12 mg/dL (ref 8–27)
CO2: 25 mmol/L (ref 20–29)
CREATININE: 0.93 mg/dL (ref 0.57–1.00)
Calcium: 9.7 mg/dL (ref 8.7–10.3)
Chloride: 98 mmol/L (ref 96–106)
GFR calc Af Amer: 76 mL/min/{1.73_m2} (ref >59)
GFR, EST NON AFRICAN AMERICAN: 66 mL/min/{1.73_m2} (ref >59)
Glucose: 108 mg/dL — ABNORMAL HIGH (ref 65–99)
Potassium: 4.2 mmol/L (ref 3.5–5.2)
SODIUM: 139 mmol/L (ref 134–144)

## 2017-09-06 LAB — HEPATITIS C ANTIBODY: Hep C Virus Ab: <0.1 s/co ratio (ref 0.0–0.9)

## 2017-09-06 MED ORDER — IRBESARTAN 300 MG PO TABS: 300.0000 mg | ORAL_TABLET | Freq: Every day | ORAL | 5 refills | Status: DC

## 2017-09-06 NOTE — Addendum Note (Signed)
Addended by: Lalla Brothers T on: 09/06/2017 01:44 PM   Modules accepted: Orders

## 2017-09-06 NOTE — Telephone Encounter (Signed)
Spoke on phone, patient ok with increasing Irbesartan to 300mg  daily. Will see her in 4 weeks, if there is cough we will need to decrease ARB and add another agent.

## 2017-09-06 NOTE — Telephone Encounter (Signed)
Irbesartan ordered 09/05/17 for 150 mg BID Per fax from [facility]: Insurance will only cover 1 tab per day

## 2017-09-07 ENCOUNTER — Ambulatory Visit (INDEPENDENT_AMBULATORY_CARE_PROVIDER_SITE_OTHER): Payer: Medicare Other | Admitting: Podiatry

## 2017-09-07 ENCOUNTER — Telehealth: Payer: Self-pay | Admitting: *Deleted

## 2017-09-07 ENCOUNTER — Encounter: Payer: Self-pay | Admitting: Podiatry

## 2017-09-07 ENCOUNTER — Ambulatory Visit (INDEPENDENT_AMBULATORY_CARE_PROVIDER_SITE_OTHER): Payer: Medicare Other

## 2017-09-07 VITALS — BP 168/88 | HR 58 | Resp 18

## 2017-09-07 DIAGNOSIS — R609 Edema, unspecified: Secondary | ICD-10-CM

## 2017-09-07 DIAGNOSIS — M19072 Primary osteoarthritis, left ankle and foot: Secondary | ICD-10-CM | POA: Diagnosis not present

## 2017-09-07 DIAGNOSIS — M792 Neuralgia and neuritis, unspecified: Secondary | ICD-10-CM | POA: Diagnosis not present

## 2017-09-07 DIAGNOSIS — G629 Polyneuropathy, unspecified: Secondary | ICD-10-CM

## 2017-09-07 DIAGNOSIS — R200 Anesthesia of skin: Secondary | ICD-10-CM

## 2017-09-07 NOTE — Progress Notes (Signed)
Subjective:    Patient ID: [name], female    DOB: 1956-03-28, 61 y.o.   MRN: [id number]  HPI  61 year old female presents the office today for concerns of left foot pain, swelling as well as numbness and burning pain to her left foot which is been ongoing for about 2 months. She denies any recent injury or trauma. She does soak it in warm water which does help. She states that she gets some sharp pains at nighttime mostly to the top of her foot. She denies any cold feelings to her feet. She previously seen a neurosurgeon several years ago was told she may have neuropathy. He discuss a nerve conduction test that she never got it done. She has no other concerns today.  She previously  was tried on Lyrica however due to side effects to stop this. This was apparently for her headaches   Review of Systems  All other systems reviewed and are negative.  Past Medical History:  Diagnosis Date  . Acute urinary retention s/p Foley 01/07/2012  . CAD (coronary artery disease)    a. 12/2013 s/p CABG x 5 Dr. Darcey Nora (LIMA to LAD, SVG to diagonal, SVG to OM1, SVG to OM2, SVG to PDA)  . Carotid arterial disease ([place])    a. 12/2013 Carotid U/S: 1-39% bilat ICA stenosis.  . Carpal tunnel syndrome, bilateral   . Hemorrhoids, internal, with bleeding & prolapse 12/13/2011  . Hernia, abdominal   . History of blood transfusion    CABG and hysterectomy  . History of kidney stones   . Hypertension   . Iron deficiency 12-07-2011  . Keloid scar    a. Sternal Keloid s/p CABG with ongoing pain.  . Morbid obesity ([place])   . Myocardial infarct ([place])   . Neuropathy   . Osteoarthritis   . Osteoporosis   . Seasonal allergies   . Sleep apnea    no CPAP machine; sleep study 02/2010 REM AHI 61.7/hr, total sleep REM 14.8/hr  . Uterine cancer ([place]) 7/15   clinical stage IA grade 1 endometrioid endometrial cancer    Past Surgical History:  Procedure Laterality Date  . ABDOMINAL HYSTERECTOMY    . BREAST  REDUCTION SURGERY Bilateral 10/10/2015   Procedure: BILATERAL MAMMARY REDUCTION  (BREAST) WITH FREE NIPPLE GRAFT;  Surgeon: Irene Limbo, MD;  Location: [facility];  Service: Plastics;  Laterality: Bilateral;  . BREAST SURGERY     reduction  . CARDIAC CATHETERIZATION    . COLONOSCOPY  2009  . COLONOSCOPY W/ BIOPSIES AND POLYPECTOMY    . CORONARY ARTERY BYPASS GRAFT N/A 01/09/2014   Procedure: CORONARY ARTERY BYPASS GRAFTING (CABG) x 5 using left internal mammary artery and right leg greater saphenous vein harvested endoscopically;  Surgeon: Ivin Poot, MD;  Location: [facility];  Service: Open Heart Surgery;  Laterality: N/A;  please use bed extenders and breast binder  . INTRAOPERATIVE TRANSESOPHAGEAL ECHOCARDIOGRAM N/A 01/09/2014   Procedure: INTRAOPERATIVE TRANSESOPHAGEAL ECHOCARDIOGRAM;  Surgeon: Ivin Poot, MD;  Location: [facility];  Service: Open Heart Surgery;  Laterality: N/A;  . KNEE ARTHROSCOPY  1991  . KNEE SURGERY Bilateral 1999  . LEFT HEART CATHETERIZATION WITH CORONARY ANGIOGRAM N/A 01/04/2014   Procedure: LEFT HEART CATHETERIZATION WITH CORONARY ANGIOGRAM;  Surgeon: Sinclair Grooms, MD;  Location: [facility];  Service: Cardiovascular;  Laterality: N/A;  . LESION EXCISION WITH COMPLEX REPAIR N/A 05/07/2016   Procedure: COMPLEX REPAIR OF CHEST 16 CM;  Surgeon: Irene Limbo, MD;  Location: [facility];  Service: Plastics;  Laterality: N/A;  . MULTIPLE EXTRACTIONS WITH ALVEOLOPLASTY N/A 04/11/2014   Procedure: Extraction of tooth #'s 1,2,8,16 with alveoloplasty, maxillary tuberosity reductions, and gross debridement of remaining teeth.;  Surgeon: Lenn Cal, DDS;  Location: [facility];  Service: Oral Surgery;  Laterality: N/A;  . MULTIPLE TOOTH EXTRACTIONS    . NM MYOCAR PERF WALL MOTION  01/2010   dipyridamole myoview - moderate perfusion defect in basal inferoseptal, basal inferior, mid inferoseptal, mid inferior, apical inferior region; EF 56%  . TEE WITHOUT CARDIOVERSION  01/2010    EF 60-65%, small, flat, non-infiltrating, calcified, fixed apical/septal mass  . TOTAL KNEE ARTHROPLASTY Left 04/29/2011  . transanal hemorrhoidal dearterliaization  01/06/12   with external hemorrhoid removal  . UNILATERAL SALPINGECTOMY Right 06/03/2014   Procedure: UNILATERAL SALPINGECTOMY;  Surgeon: Lavonia Drafts, MD;  Location: [facility];  Service: Gynecology;  Laterality: Right;  Marland Kitchen VAGINAL HYSTERECTOMY N/A 06/03/2014   Procedure: HYSTERECTOMY VAGINAL;  Surgeon: Lavonia Drafts, MD;  Location: [facility];  Service: Gynecology;  Laterality: N/A;  . WISDOM TOOTH EXTRACTION       Current Outpatient Prescriptions:  .  NON FORMULARY, [facility]  Combination Pain Cream -  Baclofen 2%, Doxepin 5%, Gabapentin 6%, Topiramate 2%, Pentoxifylline 3% Apply 1-2 grams to affected area 3-4 times daily Qty. 120 gm 3 refills, Disp: , Rfl:  .  albuterol (PROVENTIL HFA;VENTOLIN HFA) 108 (90 BASE) MCG/ACT inhaler, Inhale 2 puffs into the lungs every 6 (six) hours as needed for wheezing or shortness of breath., Disp: 1 Inhaler, Rfl: 2 .  aspirin 81 MG chewable tablet, Chew 81 mg by mouth daily., Disp: , Rfl:  .  cycloSPORINE (RESTASIS) 0.05 % ophthalmic emulsion, Place 1 drop into both eyes 2 (two) times daily., Disp: , Rfl:  .  esomeprazole (NEXIUM) 40 MG capsule, TAKE 1 CAPSULE BY MOUTH EVERY DAY, Disp: 30 capsule, Rfl: 2 .  ipratropium (ATROVENT) 0.06 % nasal spray, Place 2 sprays into both nostrils 4 (four) times daily. (Patient taking differently: Place 2 sprays into both nostrils 4 (four) times daily as needed for rhinitis. ), Disp: 15 mL, Rfl: 1 .  irbesartan (AVAPRO) 300 MG tablet, Take 1 tablet (300 mg total) by mouth daily., Disp: 30 tablet, Rfl: 5 .  meloxicam (MOBIC) 15 MG tablet, Take 15 mg by mouth daily as needed for pain. , Disp: , Rfl: 2 .  metoprolol tartrate (LOPRESSOR) 25 MG tablet, TAKE 1.5 TABLETS BY MOUTH TWICE A DAY, Disp: 270 tablet, Rfl: 3 .  Multiple Vitamin (MULTIVITAMIN  WITH MINERALS) TABS tablet, Take 1 tablet by mouth every Monday, Wednesday, and Friday., Disp: , Rfl:  .  rosuvastatin (CRESTOR) 20 MG tablet, TAKE 1 TABLET BY MOUTH EVERY DAY, Disp: 30 tablet, Rfl: 9  Allergies  Allergen Reactions  . Cephalosporins Anaphylaxis, Swelling and Other (See Comments)    Tongue swelling, gum pain  . Eicosapentaenoic Acid (Epa) Anaphylaxis and Shortness Of Breath  . Fish-Derived Products Anaphylaxis and Shortness Of Breath  . Peanuts [Peanut Oil] Anaphylaxis, Shortness Of Breath and Other (See Comments)    Peanut butter  . Shrimp [Shellfish Allergy] Anaphylaxis    Pt states her throat will swell if she eats shrimp.  . Latex Itching  . Metronidazole Other (See Comments)    Palpitations, mild SOB, metallic taste, dry mouth, high blood pressure  . Ciprofloxacin Other (See Comments)    Gaging and achy  . Lisinopril Cough       .  Naproxen Nausea Only and Other (See Comments)    Headache  . Other Itching and Other (See Comments)     All pain meds make her itch - has to have something to prevent that in addition to receiving med  . Prednisone Other (See Comments)    Heart beating fast       Social History   Social History  . Marital status: Single    Spouse name: N/A  . Number of children: 2  . Years of education: N/A   Occupational History  . Retired    Social History Main Topics  . Smoking status: Never Smoker  . Smokeless tobacco: Never Used  . Alcohol use No  . Drug use: No  . Sexual activity: Not Currently   Other Topics Concern  . Not on file   Social History Narrative   Lives alone in a one story home.  Has 2 children.     Retired Regulatory affairs officer.     Education: 2 years of college.        Objective:   Physical Exam General: AAO x3, NAD  Dermatological: Skin is warm, dry and supple bilateral.  There are no open sores, no preulcerative lesions, no rash or signs of infection present.  Vascular: Dorsalis Pedis artery and Posterior  Tibial artery pedal pulses are 2/4 bilateral with immedate capillary fill time. There is no pain with calf compression, swelling, warmth, erythema.   Neruologic: Grossly intact via light touch bilateral. Negative Tinel sign bilaterally. Simms Weinstein monofilament and decreased sensation on the left foot compared to the contralateral extremity. Operatory sensation intact bilaterally.  Musculoskeletal: There is no area pinpoint bony tenderness or pain the vibratory sensation. Small palpable spurring present off the dorsal aspect left midfoot but there is no pain in this area. There is minimal edema to the left foot there is no overlying erythema or increase in warmth. There is no area of tenderness identified at this time. MMT 5/5. Range of motion intact. Gait is unassisted  Assessment: Neuritis symptoms left foot; osteoarthritis  Plan: -Treatment options discussed including all alternatives, risks, and complications -Etiology of symptoms were discussed -X-rays were obtained and reviewed with the patient. On the lateral view there is arthritic changes present in the midfoot. Small spurring of the dorsal aspect of the first metatarsal head. No evidence of acute fracture identified today. -At this time given her symptoms appear to be more neurological in origin given the numbness, sharp pain especially nighttime. I would like to go ahead and get a nerve conduction test and this was ordered for her today. Message sent to [facility].  -I ordered compound cream to include anti-inflammatory as well as gabapentin. Is ordered [facility] -Discussed steroid injection however she has no pain today so we'll hold off on this. Discussed shoe gear modifications as well. -Follow-up in conduction test is sooner if needed. Call any questions or concerns.  Celesta Gentile, DPM

## 2017-09-07 NOTE — Telephone Encounter (Addendum)
-----   Message from Trula Slade, DPM sent at 09/07/2017  1:52 PM EDT ----- Can you please order a NCV for neuropathy and numbness to the left foot. Faxed orders to [facility].

## 2017-09-13 ENCOUNTER — Encounter: Payer: Self-pay | Admitting: Neurology

## 2017-09-13 ENCOUNTER — Other Ambulatory Visit: Payer: Self-pay | Admitting: *Deleted

## 2017-09-13 DIAGNOSIS — G629 Polyneuropathy, unspecified: Secondary | ICD-10-CM

## 2017-09-13 NOTE — Progress Notes (Unsigned)
emge

## 2017-09-20 ENCOUNTER — Ambulatory Visit (INDEPENDENT_AMBULATORY_CARE_PROVIDER_SITE_OTHER): Payer: Medicare Other | Admitting: Neurology

## 2017-09-20 DIAGNOSIS — G629 Polyneuropathy, unspecified: Secondary | ICD-10-CM | POA: Diagnosis not present

## 2017-09-20 NOTE — Procedures (Signed)
[facility]  [address], [address]  [place], [place] [address] Tel: [phone] Fax:  [phone] Test Date:  09/20/2017  Patient: [name] DOB: 1956/05/02 Physician: Narda Amber, DO  Sex: Female Height: 5\' 8"  Ref Phys: Celesta Gentile, DPM  ID#: [id number] Temp: 37.0C Technician:    Patient Complaints: This is a 61 year-old female referred for evaluation of left foot burning paresthesias.  NCV & EMG Findings: Extensive electrodiagnostic testing of the left lower extremity shows:  1. Left sural and superficial peroneal sensory responses are absent. 2. Left tibial and peroneal (EDB) motor responses show reduced amplitude. Left peroneal motor response at the tibialis anterior is within normal limits. 3. Left tibial H reflex studies within normal limits. 4. Chronic motor axon loss changes are seen affecting the tibialis anterior and flexor digitorum longus muscles, without accompanied active denervation.  Impression: The electrophysiologic findings are most consistent with a chronic sensorimotor polyneuropathy, axon loss in type, affecting the left lower extremity.   ___________________________ Narda Amber, DO    Nerve Conduction Studies Anti Sensory Summary Table   Stim Site NR Peak (ms) Norm Peak (ms) P-T Amp (V) Norm P-T Amp  Left Sup Peroneal Anti Sensory (Ant Lat Mall)  12 cm NR  <4.6  >3  Left Sural Anti Sensory (Lat Mall)  Calf NR  <4.6  >3   Motor Summary Table   Stim Site NR Onset (ms) Norm Onset (ms) O-P Amp (mV) Norm O-P Amp Site1 Site2 Delta-0 (ms) Dist (cm) Vel (m/s) Norm Vel (m/s)  Left Peroneal Motor (Ext Dig Brev)  Ankle    3.4 <6.0 1.5 >2.5 B Fib Ankle 9.2 38.0 41 >40  B Fib    12.6  1.2  Poplt B Fib 1.2 8.0 67 >40  Poplt    13.8  1.2         Left Peroneal TA Motor (Tib Ant)  Fib Head    2.7 <4.5 5.5 >3 Poplit Fib Head 1.1 8.0 73 >40  Poplit    3.8  5.4         Left Tibial Motor (Abd Hall Brev)  Ankle    5.9 <6.0 1.6 >4 Knee  Ankle 10.2 41.0 40 >40  Knee    16.1  1.5          H Reflex Studies   NR H-Lat (ms) Lat Norm (ms) L-R H-Lat (ms)  Left Tibial (Gastroc)     34.15 <35    EMG   Side Muscle Ins Act Fibs Psw Fasc Number Recrt Dur Dur. Amp Amp. Poly Poly. Comment  Left AntTibialis Nml Nml Nml Nml 1- Mod-R Some 1+ Some 1+ Nml Nml N/A  Left Gastroc Nml Nml Nml Nml Nml Nml Nml Nml Nml Nml Nml Nml N/A  Left Flex Dig Long Nml Nml Nml Nml 1- Mod-R Some 1+ Some 1+ Nml Nml N/A  Left RectFemoris Nml Nml Nml Nml Nml Nml Nml Nml Nml Nml Nml Nml N/A  Left GluteusMed Nml Nml Nml Nml Nml Nml Nml Nml Nml Nml Nml Nml N/A  Left BicepsFemS Nml Nml Nml Nml Nml Nml Nml Nml Nml Nml Nml Nml N/A      Waveforms:

## 2017-09-21 ENCOUNTER — Telehealth: Payer: Self-pay | Admitting: *Deleted

## 2017-09-21 DIAGNOSIS — G629 Polyneuropathy, unspecified: Secondary | ICD-10-CM

## 2017-09-21 NOTE — Progress Notes (Signed)
Val-can you let her know that her results do show neuropathy in the left leg. Also, can you please put in for a neurology consult for neuropathy? Also have her avoid warm water soaks. Thanks.

## 2017-09-21 NOTE — Telephone Encounter (Addendum)
-----   Message from Trula Slade, DPM sent at 09/21/2017  7:01 AM EDT -----Dr. Jacqualyn Posey states her NCV with EMG does show left leg neuropathy, and he request pt be sent for a neurology consultation, and to instruct pt not to take warm soaks. Left message informing pt Dr. Jacqualyn Posey had reviewed her testing results and to call for instructions. Orders faxed to [facility].   ----- Message ----- From: Alda Berthold, DO Sent: 09/20/2017   2:14 PM To: Axel Filler, MD, #

## 2017-10-19 ENCOUNTER — Emergency Department (HOSPITAL_COMMUNITY): Payer: Medicare Other

## 2017-10-19 ENCOUNTER — Other Ambulatory Visit: Payer: Self-pay

## 2017-10-19 ENCOUNTER — Emergency Department (HOSPITAL_COMMUNITY)
Admission: EM | Admit: 2017-10-19 | Discharge: 2017-10-19 | Disposition: A | Discharge status: Home / Self Care | Payer: Medicare Other | Attending: Emergency Medicine | Admitting: Emergency Medicine

## 2017-10-19 ENCOUNTER — Encounter (HOSPITAL_COMMUNITY): Payer: Self-pay

## 2017-10-19 DIAGNOSIS — R0789 Other chest pain: Secondary | ICD-10-CM | POA: Diagnosis not present

## 2017-10-19 DIAGNOSIS — I10 Essential (primary) hypertension: Secondary | ICD-10-CM | POA: Insufficient documentation

## 2017-10-19 DIAGNOSIS — C55 Malignant neoplasm of uterus, part unspecified: Secondary | ICD-10-CM | POA: Diagnosis not present

## 2017-10-19 DIAGNOSIS — Z951 Presence of aortocoronary bypass graft: Secondary | ICD-10-CM | POA: Diagnosis not present

## 2017-10-19 DIAGNOSIS — I252 Old myocardial infarction: Secondary | ICD-10-CM | POA: Diagnosis not present

## 2017-10-19 DIAGNOSIS — Z91040 Latex allergy status: Secondary | ICD-10-CM | POA: Insufficient documentation

## 2017-10-19 DIAGNOSIS — M25512 Pain in left shoulder: Secondary | ICD-10-CM | POA: Insufficient documentation

## 2017-10-19 DIAGNOSIS — R030 Elevated blood-pressure reading, without diagnosis of hypertension: Secondary | ICD-10-CM | POA: Diagnosis not present

## 2017-10-19 DIAGNOSIS — Z91010 Allergy to peanuts: Secondary | ICD-10-CM | POA: Insufficient documentation

## 2017-10-19 DIAGNOSIS — M79603 Pain in arm, unspecified: Secondary | ICD-10-CM | POA: Diagnosis not present

## 2017-10-19 DIAGNOSIS — I2510 Atherosclerotic heart disease of native coronary artery without angina pectoris: Secondary | ICD-10-CM | POA: Insufficient documentation

## 2017-10-19 DIAGNOSIS — Z96652 Presence of left artificial knee joint: Secondary | ICD-10-CM | POA: Insufficient documentation

## 2017-10-19 DIAGNOSIS — Z7982 Long term (current) use of aspirin: Secondary | ICD-10-CM | POA: Diagnosis not present

## 2017-10-19 DIAGNOSIS — Z79899 Other long term (current) drug therapy: Secondary | ICD-10-CM | POA: Diagnosis not present

## 2017-10-19 DIAGNOSIS — R079 Chest pain, unspecified: Secondary | ICD-10-CM | POA: Diagnosis not present

## 2017-10-19 LAB — CBC
HCT: 38.4 % (ref 36.0–46.0)
HEMOGLOBIN: 12.7 g/dL (ref 12.0–15.0)
MCH: 27.4 pg (ref 26.0–34.0)
MCHC: 33.1 g/dL (ref 30.0–36.0)
MCV: 82.9 fL (ref 78.0–100.0)
PLATELETS: 203 10*3/uL (ref 150–400)
RBC: 4.63 MIL/uL (ref 3.87–5.11)
RDW: 15.5 % (ref 11.5–15.5)
WBC: 6.7 10*3/uL (ref 4.0–10.5)

## 2017-10-19 LAB — BASIC METABOLIC PANEL
ANION GAP: 7 (ref 5–15)
BUN: 7 mg/dL (ref 6–20)
CALCIUM: 8.6 mg/dL — AB (ref 8.9–10.3)
CO2: 27 mmol/L (ref 22–32)
CREATININE: 0.75 mg/dL (ref 0.44–1.00)
Chloride: 103 mmol/L (ref 101–111)
Glucose, Bld: 104 mg/dL — ABNORMAL HIGH (ref 65–99)
Potassium: 3.7 mmol/L (ref 3.5–5.1)
SODIUM: 137 mmol/L (ref 135–145)

## 2017-10-19 LAB — I-STAT TROPONIN, ED: TROPONIN I, POC: 0 ng/mL (ref 0.00–0.08)

## 2017-10-19 MED ORDER — TRAMADOL HCL 50 MG PO TABS: 50.0000 mg | ORAL_TABLET | Freq: Four times a day (QID) | ORAL | 0 refills | Status: DC | PRN

## 2017-10-19 MED ORDER — IBUPROFEN 400 MG PO TABS
400.0000 mg | ORAL_TABLET | Freq: Once | ORAL | Status: AC
  Administered 2017-10-19: 400 mg via ORAL
  Filled 2017-10-19: qty 1

## 2017-10-19 MED ORDER — TRAMADOL HCL 50 MG PO TABS
50.0000 mg | ORAL_TABLET | Freq: Once | ORAL | Status: AC
  Administered 2017-10-19: 50 mg via ORAL
  Filled 2017-10-19: qty 1

## 2017-10-19 NOTE — ED Provider Notes (Signed)
[facility] EMERGENCY DEPARTMENT Provider Note   CSN: [id number] Arrival date & time: 10/19/17  0759     History   Chief Complaint Chief Complaint  Patient presents with  . Arm Pain    HPI [name] is a 61 y.o. female.  Patient c/o left shoulder area pain for the past couple of days. Pain constant, dull to sharp in nature, worse w certain movements. Pain occasional shoots down arm, sharp, lasts seconds. No arm numbness or weakness. No loss of normal function.  Denies neck pain. No chest pain or discomfort. No sob. No nv. No diaphoresis. No pleuritic pain. Denies cough or uri c/o. Denies hx ddd.  Does note being told rotator cuff problems with left shoulder. Denies recent injury. No swelling of arm. No skin changes.    The history is provided by the patient.  Arm Pain  Pertinent negatives include no chest pain, no abdominal pain, no headaches and no shortness of breath.    Past Medical History:  Diagnosis Date  . Acute urinary retention s/p Foley 01/07/2012  . CAD (coronary artery disease)    a. 12/2013 s/p CABG x 5 Dr. Darcey Nora (LIMA to LAD, SVG to diagonal, SVG to OM1, SVG to OM2, SVG to PDA)  . Carotid arterial disease ([place])    a. 12/2013 Carotid U/S: 1-39% bilat ICA stenosis.  . Carpal tunnel syndrome, bilateral   . Hemorrhoids, internal, with bleeding & prolapse 12/13/2011  . Hernia, abdominal   . History of blood transfusion    CABG and hysterectomy  . History of kidney stones   . Hypertension   . Iron deficiency 12-07-2011  . Keloid scar    a. Sternal Keloid s/p CABG with ongoing pain.  . Morbid obesity ([place])   . Myocardial infarct ([place])   . Neuropathy   . Osteoarthritis   . Osteoporosis   . Seasonal allergies   . Sleep apnea    no CPAP machine; sleep study 02/2010 REM AHI 61.7/hr, total sleep REM 14.8/hr  . Uterine cancer ([place]) 7/15   clinical stage IA grade 1 endometrioid endometrial cancer    Patient Active Problem List   Diagnosis  Date Noted  . Glucose intolerance 09/05/2017  . Swelling of left foot 08/19/2017  . Atherosclerosis of aorta ([place]) 06/20/2015  . Osteoarthritis of right knee 11/07/2014  . Health care maintenance 10/09/2014  . Coronary atherosclerosis of native coronary artery 01/05/2014  . Morbid obesity due to excess calories ([place]) 05/10/2012  . Obstructive sleep apnea 03/27/2010  . GERD 08/06/2008  . Hyperlipidemia 10/31/2007  . Essential hypertension 06/30/2007    Past Surgical History:  Procedure Laterality Date  . ABDOMINAL HYSTERECTOMY    . BREAST REDUCTION SURGERY Bilateral 10/10/2015   Procedure: BILATERAL MAMMARY REDUCTION  (BREAST) WITH FREE NIPPLE GRAFT;  Surgeon: Irene Limbo, MD;  Location: [facility];  Service: Plastics;  Laterality: Bilateral;  . BREAST SURGERY     reduction  . CARDIAC CATHETERIZATION    . COLONOSCOPY  2009  . COLONOSCOPY W/ BIOPSIES AND POLYPECTOMY    . CORONARY ARTERY BYPASS GRAFT N/A 01/09/2014   Procedure: CORONARY ARTERY BYPASS GRAFTING (CABG) x 5 using left internal mammary artery and right leg greater saphenous vein harvested endoscopically;  Surgeon: Ivin Poot, MD;  Location: [facility];  Service: Open Heart Surgery;  Laterality: N/A;  please use bed extenders and breast binder  . INTRAOPERATIVE TRANSESOPHAGEAL ECHOCARDIOGRAM N/A 01/09/2014   Procedure: INTRAOPERATIVE TRANSESOPHAGEAL ECHOCARDIOGRAM;  Surgeon: Tharon Aquas  Kerby Less, MD;  Location: [facility];  Service: Open Heart Surgery;  Laterality: N/A;  . KNEE ARTHROSCOPY  1991  . KNEE SURGERY Bilateral 1999  . LEFT HEART CATHETERIZATION WITH CORONARY ANGIOGRAM N/A 01/04/2014   Procedure: LEFT HEART CATHETERIZATION WITH CORONARY ANGIOGRAM;  Surgeon: Sinclair Grooms, MD;  Location: [facility] CATH LAB;  Service: Cardiovascular;  Laterality: N/A;  . LESION EXCISION WITH COMPLEX REPAIR N/A 05/07/2016   Procedure: COMPLEX REPAIR OF CHEST 16 CM;  Surgeon: Irene Limbo, MD;  Location: [facility];  Service: Plastics;  Laterality: N/A;    . MULTIPLE EXTRACTIONS WITH ALVEOLOPLASTY N/A 04/11/2014   Procedure: Extraction of tooth #'s 1,2,8,16 with alveoloplasty, maxillary tuberosity reductions, and gross debridement of remaining teeth.;  Surgeon: Lenn Cal, DDS;  Location: [facility];  Service: Oral Surgery;  Laterality: N/A;  . MULTIPLE TOOTH EXTRACTIONS    . NM MYOCAR PERF WALL MOTION  01/2010   dipyridamole myoview - moderate perfusion defect in basal inferoseptal, basal inferior, mid inferoseptal, mid inferior, apical inferior region; EF 56%  . TEE WITHOUT CARDIOVERSION  01/2010   EF 60-65%, small, flat, non-infiltrating, calcified, fixed apical/septal mass  . TOTAL KNEE ARTHROPLASTY Left 04/29/2011  . transanal hemorrhoidal dearterliaization  01/06/12   with external hemorrhoid removal  . UNILATERAL SALPINGECTOMY Right 06/03/2014   Procedure: UNILATERAL SALPINGECTOMY;  Surgeon: Lavonia Drafts, MD;  Location: [facility];  Service: Gynecology;  Laterality: Right;  Marland Kitchen VAGINAL HYSTERECTOMY N/A 06/03/2014   Procedure: HYSTERECTOMY VAGINAL;  Surgeon: Lavonia Drafts, MD;  Location: [facility];  Service: Gynecology;  Laterality: N/A;  . WISDOM TOOTH EXTRACTION      OB History    Gravida Para Term Preterm AB Living   2 2 2  0 0 2   SAB TAB Ectopic Multiple Live Births   0 0 0 0         Home Medications    Prior to Admission medications   Medication Sig Start Date End Date Taking? Authorizing Provider  albuterol (PROVENTIL HFA;VENTOLIN HFA) 108 (90 BASE) MCG/ACT inhaler Inhale 2 puffs into the lungs every 6 (six) hours as needed for wheezing or shortness of breath. 03/30/15   Gregor Hams, MD  aspirin 81 MG chewable tablet Chew 81 mg by mouth daily.    [provider]  cycloSPORINE (RESTASIS) 0.05 % ophthalmic emulsion Place 1 drop into both eyes 2 (two) times daily.    [provider]  esomeprazole (NEXIUM) 40 MG capsule TAKE 1 CAPSULE BY MOUTH EVERY DAY 06/21/17   Axel Filler, MD  ipratropium  (ATROVENT) 0.06 % nasal spray Place 2 sprays into both nostrils 4 (four) times daily. Patient taking differently: Place 2 sprays into both nostrils 4 (four) times daily as needed for rhinitis.  03/30/15   Gregor Hams, MD  irbesartan (AVAPRO) 300 MG tablet Take 1 tablet (300 mg total) by mouth daily. 09/06/17   Axel Filler, MD  meloxicam (MOBIC) 15 MG tablet Take 15 mg by mouth daily as needed for pain.  04/01/16   [provider]  metoprolol tartrate (LOPRESSOR) 25 MG tablet TAKE 1.5 TABLETS BY MOUTH TWICE A DAY 11/10/16   Hilty, Nadean Corwin, MD  Multiple Vitamin (MULTIVITAMIN WITH MINERALS) TABS tablet Take 1 tablet by mouth every Monday, Wednesday, and Friday.    [provider]  NON [place]  Combination Pain Cream -  Baclofen 2%, Doxepin 5%, Gabapentin 6%, Topiramate 2%, Pentoxifylline 3% Apply 1-2 grams to affected area 3-4 times  daily Qty. 120 gm 3 refills    [provider]  rosuvastatin (CRESTOR) 20 MG tablet TAKE 1 TABLET BY MOUTH EVERY DAY 08/22/17   Hilty, Nadean Corwin, MD    Family History Family History  Problem Relation Age of Onset  . Heart attack Mother 68  . Heart disease Mother   . Hypertension Mother   . Diabetes Mother   . Hypertension Sister   . Heart failure Sister   . Heart disease Sister   . Diabetes Sister   . Kidney disease Brother        hypertension  . Hypertension Daughter   . Colon cancer Neg Hx     Social History Social History   Tobacco Use  . Smoking status: Never Smoker  . Smokeless tobacco: Never Used  Substance Use Topics  . Alcohol use: No    Alcohol/week: 0.0 oz  . Drug use: No     Allergies   Cephalosporins; Eicosapentaenoic acid (epa); Fish-derived products; Peanuts [peanut oil]; Shrimp [shellfish allergy]; Latex; Metronidazole; Ciprofloxacin; Lisinopril; Naproxen; Other; and Prednisone   Review of Systems Review of Systems  Constitutional: Negative for fever.  HENT: Negative  for sore throat.   Eyes: Negative for redness.  Respiratory: Negative for shortness of breath.   Cardiovascular: Negative for chest pain.  Gastrointestinal: Negative for abdominal pain.  Genitourinary: Negative for flank pain.  Musculoskeletal: Negative for back pain and neck pain.  Skin: Negative for rash.  Neurological: Negative for weakness, numbness and headaches.  Hematological: Does not bruise/bleed easily.  Psychiatric/Behavioral: Negative for confusion.     Physical Exam Updated Vital Signs BP (!) 144/66   Pulse 60   Resp (!) 21   LMP 01/13/2014 Comment: spotting in Feb and bleeding since April  SpO2 99%   Physical Exam  Constitutional: She appears well-developed and well-nourished. No distress.  HENT:  Head: Atraumatic.  Eyes: Conjunctivae are normal. No scleral icterus.  Neck: Normal range of motion. Neck supple. No tracheal deviation present.  Cardiovascular: Normal rate, regular rhythm, normal heart sounds and intact distal pulses. Exam reveals no gallop and no friction rub.  No murmur heard. Pulmonary/Chest: Effort normal and breath sounds normal. No respiratory distress.  Abdominal: Soft. Normal appearance and bowel sounds are normal. She exhibits no distension. There is no tenderness.  Musculoskeletal: She exhibits no edema.  Good passive rom left shoulder and elbow without pain. Radial pulse 2+. No swelling about shoulder or arm. No pain with rom neck. C spine non tender, aligned.   Neurological: She is alert.  LUE rad/med/uln n strength 5/5, sens grossly intact.   Skin: Skin is warm and dry. No rash noted. She is not diaphoretic.  Psychiatric: She has a normal mood and affect.  Nursing note and vitals reviewed.    ED Treatments / Results  Labs (all labs ordered are listed, but only abnormal results are displayed) Results for orders placed or performed during the hospital encounter of 25/36/64  Basic metabolic panel  Result Value Ref Range   Sodium 137  135 - 145 mmol/L   Potassium 3.7 3.5 - 5.1 mmol/L   Chloride 103 101 - 111 mmol/L   CO2 27 22 - 32 mmol/L   Glucose, Bld 104 (H) 65 - 99 mg/dL   BUN 7 6 - 20 mg/dL   Creatinine, Ser 0.75 0.44 - 1.00 mg/dL   Calcium 8.6 (L) 8.9 - 10.3 mg/dL   GFR calc non Af Amer >60 >60 mL/min   GFR calc  Af Amer >60 >60 mL/min   Anion gap 7 5 - 15  CBC  Result Value Ref Range   WBC 6.7 4.0 - 10.5 K/uL   RBC 4.63 3.87 - 5.11 MIL/uL   Hemoglobin 12.7 12.0 - 15.0 g/dL   HCT 38.4 36.0 - 46.0 %   MCV 82.9 78.0 - 100.0 fL   MCH 27.4 26.0 - 34.0 pg   MCHC 33.1 30.0 - 36.0 g/dL   RDW 15.5 11.5 - 15.5 %   Platelets 203 150 - 400 K/uL  I-stat troponin, ED  Result Value Ref Range   Troponin i, poc 0.00 0.00 - 0.08 ng/mL   Comment 3           Dg Chest 2 View  Result Date: 10/19/2017 CLINICAL DATA:  61 year old female with pain radiating to the left shoulder. No known injury. EXAM: CHEST  2 VIEW COMPARISON:  05/16/2015 and earlier. FINDINGS: Prior CABG. 80s tunnel contours remain normal. Stable lung volumes. No pneumothorax or pleural effusion. Chronic increased interstitial opacity is regressed. No acute osseous abnormality identified. Negative visible bowel gas pattern. IMPRESSION: No acute cardiopulmonary abnormality. Electronically Signed   By: Genevie Ann M.D.   On: 10/19/2017 09:01   Dg Shoulder Left  Result Date: 10/19/2017 CLINICAL DATA:  61 year old female with pain radiating to the left shoulder. No known injury. EXAM: LEFT SHOULDER - 2+ VIEW COMPARISON:  Left shoulder series 92810. FINDINGS: No glenohumeral joint dislocation. Glenoid and humeral head osteophytosis, subchondral cysts and sclerosis. Proximal left humerus appears intact. No left clavicle or scapula fracture. Negative visible left chest. IMPRESSION: Left glenohumeral osteoarthritis, moderate to severe. No acute osseous abnormality identified. Electronically Signed   By: Genevie Ann M.D.   On: 10/19/2017 09:02    EKG  EKG  Interpretation None       Radiology Dg Chest 2 View  Result Date: 10/19/2017 CLINICAL DATA:  61 year old female with pain radiating to the left shoulder. No known injury. EXAM: CHEST  2 VIEW COMPARISON:  05/16/2015 and earlier. FINDINGS: Prior CABG. 80s tunnel contours remain normal. Stable lung volumes. No pneumothorax or pleural effusion. Chronic increased interstitial opacity is regressed. No acute osseous abnormality identified. Negative visible bowel gas pattern. IMPRESSION: No acute cardiopulmonary abnormality. Electronically Signed   By: Genevie Ann M.D.   On: 10/19/2017 09:01   Dg Shoulder Left  Result Date: 10/19/2017 CLINICAL DATA:  61 year old female with pain radiating to the left shoulder. No known injury. EXAM: LEFT SHOULDER - 2+ VIEW COMPARISON:  Left shoulder series 92810. FINDINGS: No glenohumeral joint dislocation. Glenoid and humeral head osteophytosis, subchondral cysts and sclerosis. Proximal left humerus appears intact. No left clavicle or scapula fracture. Negative visible left chest. IMPRESSION: Left glenohumeral osteoarthritis, moderate to severe. No acute osseous abnormality identified. Electronically Signed   By: Genevie Ann M.D.   On: 10/19/2017 09:02    Procedures Procedures (including critical care time)  Medications Ordered in ED Medications  ibuprofen (ADVIL,MOTRIN) tablet 400 mg (not administered)  traMADol (ULTRAM) tablet 50 mg (not administered)     Initial Impression / Assessment and Plan / ED Course  I have reviewed the triage vital signs and the nursing notes.  Pertinent labs & imaging results that were available during my care of the patient were reviewed by me and considered in my medical decision making (see chart for details).  Labs and imaging study ordered.  Pt indicates has not taken any meds for pain today.  Ibuprofen po. Ultram po.  After constant symptoms for past couple days, trop 0.  Symptoms do not appear c/w acs. Patients symptoms and  exam do appear most c/w musculoskeletal pain.  Ultram po. Motrin po.  Patient comfortable appearing.  Patient currently appears stable for d/c.       Final Clinical Impressions(s) / ED Diagnoses   Final diagnoses:  None    ED Discharge Orders    None       Lajean Saver, MD 10/19/17 [phone]

## 2017-10-19 NOTE — Discharge Instructions (Addendum)
It was our pleasure to provide your ER care today - we hope that you feel better.  Take ultram as need for pain - no driving for the next 4 hours, or if/when taking ultram.  Follow up with your orthopedist in the next couple weeks.  Return to ER if worse, new symptoms, recurrent or persistent chest pain, trouble breathing, other concern.

## 2017-10-19 NOTE — ED Notes (Signed)
Pt states that "I took 4 baby asprin before I came in today".

## 2017-10-19 NOTE — ED Notes (Signed)
Pt given [place] meal and sprite.

## 2017-10-19 NOTE — ED Notes (Signed)
ED Provider at bedside. 

## 2017-10-19 NOTE — ED Notes (Addendum)
Pt ambulated to restroom and back. Steady gait noted. No additional help needed.

## 2017-10-19 NOTE — ED Triage Notes (Signed)
Per [facility]: Pt complaining of left arm pain since yesterday, woke up this morning with the same pain. No nausea, no vomiting, no shortness of breath, no diarrhea, no chest pain, no neck pain. Pt ambulatory with [facility], ambulatory on scene. Pt states that pain starts in upper arm and radiates to forearm, states that pain is worse with movement.

## 2017-10-24 ENCOUNTER — Other Ambulatory Visit: Payer: Self-pay

## 2017-10-24 ENCOUNTER — Encounter: Payer: Self-pay | Admitting: Student in an Organized Health Care Education/Training Program

## 2017-10-24 ENCOUNTER — Ambulatory Visit (INDEPENDENT_AMBULATORY_CARE_PROVIDER_SITE_OTHER): Payer: Medicare Other | Admitting: Student in an Organized Health Care Education/Training Program

## 2017-10-24 VITALS — BP 160/73 | HR 56 | Temp 98.1°F | Ht 68.0 in | Wt 318.1 lb

## 2017-10-24 DIAGNOSIS — I10 Essential (primary) hypertension: Secondary | ICD-10-CM | POA: Diagnosis not present

## 2017-10-24 DIAGNOSIS — G5792 Unspecified mononeuropathy of left lower limb: Secondary | ICD-10-CM

## 2017-10-24 DIAGNOSIS — M19012 Primary osteoarthritis, left shoulder: Secondary | ICD-10-CM

## 2017-10-24 DIAGNOSIS — Z23 Encounter for immunization: Secondary | ICD-10-CM

## 2017-10-24 MED ORDER — CHLORTHALIDONE 25 MG PO TABS: 25.0000 mg | ORAL_TABLET | Freq: Every day | ORAL | 5 refills | Status: DC

## 2017-10-24 NOTE — Assessment & Plan Note (Signed)
Left foot pain and swelling are stable, consistent with a neuropathy/neuritis of the left foot.  We referred her to podiatry, she saw Dr. Jacqualyn Posey in October.  She is to follow-up with him to complete nerve conduction studies which will help decipher if this is a primary neuritis or osteoarthritis and guide treatment.

## 2017-10-24 NOTE — Assessment & Plan Note (Signed)
Chronic hypertension currently poorly controlled.  Patient has been resistant to starting any medications in the past.  I reviewed her home blood pressure monitor which shows even worse blood pressure controlled and will be seen here in clinic.  Given recent trip to the emergency department, she is more amenable to escalating medication therapy today.  Plan is to start chlorthalidone 25 mg daily, continue irbesartan 300 mg daily and metoprolol 37.5 mg twice daily.  She has tolerated hydrochlorothiazide in the past, it looks like this was discontinued after her bypass surgery in 2015 due to transient hypotension.  I anticipate she should tolerate thiazides again at this point.

## 2017-10-24 NOTE — Progress Notes (Signed)
   Assessment and Plan:  See Encounters tab for problem-based medical decision making.   __________________________________________________________  HPI:   61 year old woman here for follow-up of hypertension.  Last Thursday she awoke from sleep with significant left shoulder pain that radiated up to her neck and down to her history is much worse than usual at about a heart attack so she called EMS.  She is found to be very hypertensive with a systolic blood pressure around 180.  She was transferred to the emergency department where her cardiac biomarkers were negative and ECG was reassuring.  She was discharged home without an overnight stay.  She is doing well today with no return of left shoulder pain, this has been well controlled on as needed tramadol.  She has been checking her blood pressures at home with a home cuff, checking several times per day.  Reports good compliance with her medications, no side effects.  Patient lives at home by herself and is independent in activities of daily living.  Sometimes she walks with the assistance of a cane if her right knee pain is flaring, currently she is doing well without assistive devices.  __________________________________________________________  Problem List: Patient Active Problem List   Diagnosis Date Noted  . Atherosclerosis of aorta ([place]) 06/20/2015    Priority: High  . Coronary atherosclerosis of native coronary artery 01/05/2014    Priority: High  . Osteoarthritis of right knee 11/07/2014    Priority: Medium  . Morbid obesity due to excess calories ([place]) 05/10/2012    Priority: Medium  . Obstructive sleep apnea 03/27/2010    Priority: Medium  . Essential hypertension 06/30/2007    Priority: Medium  . Osteoarthritis of left shoulder 10/24/2017    Priority: Low  . Neuritis of left foot 08/19/2017    Priority: Low  . Health care maintenance 10/09/2014    Priority: Low  . GERD 08/06/2008    Priority: Low  . Hyperlipidemia  10/31/2007    Priority: Low    Medications: Reconciled today in Epic __________________________________________________________  Physical Exam:  Vital Signs: Vitals:   10/24/17 1110  BP: (!) 160/73  Pulse: (!) 56  Temp: 98.1 F (36.7 C)  TempSrc: Oral  SpO2: 100%  Weight: (!) 318 lb 1.6 oz (144.3 kg)  Height: 5\' 8"  (1.727 m)    Gen: Well appearing, NAD CV: RRR, no murmurs, well-healed though hypertrophic scar over her sternum Pulm: Normal effort, CTA throughout, no wheezing Ext: Warm, no edema, left shoulder normal range of motion with no pain, she can abduct to 90 degrees, and then I can passively abduct another 40 degrees.  No erythema, warmth, or swelling consistent with bursitis. Skin: No atypical appearing moles. No rashes

## 2017-10-24 NOTE — Assessment & Plan Note (Signed)
Weight is stable, down 2 pounds over the last few months.  We talked about increasing exercise activity.  She has a step meter, set a goal of 5000 steps per day.  She will try to get back into water aerobics.  We will continue to follow-up and monitor her weights.

## 2017-10-24 NOTE — Patient Instructions (Signed)
Because your blood pressure is so high we are going to add another blood pressure medication called chlorthalidone.  Please take 1 tablet once a day in addition to your other blood pressure medications.  Let me know if you have any side effects or difficulties.  Please come back in 1 month so we can check in on your blood pressure readings.  Please continue to check your blood pressure at home a few times a week and bring in your meter to your next office visit.

## 2017-10-24 NOTE — Addendum Note (Signed)
Addended by: Crista Curb R on: 10/24/2017 12:39 PM   Modules accepted: Orders

## 2017-10-24 NOTE — Assessment & Plan Note (Signed)
Moderate intermittent left shoulder pain, moderate nocturnal symptoms.  Only very mild impingement findings today on exam.  X-ray showed glenohumeral joint space loss consistent with osteoarthritis.  We talked about conservative therapy, as needed nonsteroidal anti-inflammatories, and steroid injection therapy if pain or stiffness becomes functionally limiting.  Patient understands, currently doing well, follow-up with me as needed for this problem.

## 2017-11-02 ENCOUNTER — Other Ambulatory Visit: Payer: Self-pay | Admitting: Student in an Organized Health Care Education/Training Program

## 2017-11-28 ENCOUNTER — Encounter: Payer: Self-pay | Admitting: Student in an Organized Health Care Education/Training Program

## 2017-11-28 ENCOUNTER — Ambulatory Visit: Payer: Self-pay | Admitting: Student in an Organized Health Care Education/Training Program

## 2017-12-02 ENCOUNTER — Ambulatory Visit (INDEPENDENT_AMBULATORY_CARE_PROVIDER_SITE_OTHER): Payer: Medicare Other | Admitting: Internal Medicine

## 2017-12-02 ENCOUNTER — Encounter: Payer: Self-pay | Admitting: Internal Medicine

## 2017-12-02 ENCOUNTER — Other Ambulatory Visit: Payer: Self-pay

## 2017-12-02 VITALS — BP 135/69 | HR 77 | Temp 98.0°F | Ht 68.0 in | Wt 321.2 lb

## 2017-12-02 DIAGNOSIS — M1711 Unilateral primary osteoarthritis, right knee: Secondary | ICD-10-CM | POA: Diagnosis not present

## 2017-12-02 DIAGNOSIS — M25461 Effusion, right knee: Secondary | ICD-10-CM

## 2017-12-02 MED ORDER — TRIAMCINOLONE ACETONIDE 40 MG/ML IJ SUSP
40.0000 mg | Freq: Once | INTRAMUSCULAR | Status: DC
Start: 2017-12-02 — End: 2017-12-02

## 2017-12-02 NOTE — Progress Notes (Signed)
   CC: knee effusion  HPI:  [name] is a 62 y.o. with a PMH listed below presenting to clinic with right knee pain and effusion.  Patient previously seen for same with symptomatic relief with joint aspiration and corticosteroid injection. Previous fluid analyses did not reveal any crystals (7/18 and 10/18). Recurrent joint effusion 2/2 osteoarthritis. She endorses medial knee pain that began a few weeks ago that is progressive, along with progressive swelling w/o increased warmth, recent trauma, fevers, chills.   Please see problem based Assessment and Plan for status of patients chronic conditions.  Past Medical History:  Diagnosis Date  . Acute urinary retention s/p Foley 01/07/2012  . CAD (coronary artery disease)    a. 12/2013 s/p CABG x 5 Dr. Darcey Griffin (LIMA to LAD, SVG to diagonal, SVG to OM1, SVG to OM2, SVG to PDA)  . Carotid arterial disease ([name])    a. 12/2013 Carotid U/S: 1-39% bilat ICA stenosis.  . Carpal tunnel syndrome, bilateral   . Hemorrhoids, internal, with bleeding & prolapse 12/13/2011  . Hernia, abdominal   . History of blood transfusion    CABG and hysterectomy  . History of kidney stones   . Hypertension   . Iron deficiency 12-07-2011  . Keloid scar    a. Sternal Keloid s/p CABG with ongoing pain.  . Morbid obesity ([name])   . Myocardial infarct ([name])   . Neuropathy   . Osteoarthritis   . Osteoporosis   . Seasonal allergies   . Sleep apnea    no CPAP machine; sleep study 02/2010 REM AHI 61.7/hr, total sleep REM 14.8/hr  . Uterine cancer ([name]) 7/15   clinical stage IA grade 1 endometrioid endometrial cancer    Review of Systems:   ROS Per HPI  Physical Exam:  Vitals:   12/02/17 1407  BP: 135/69  Pulse: 77  Temp: 98 F (36.7 C)  TempSrc: Oral  SpO2: 99%  Weight: (!) 321 lb 3.2 oz (145.7 kg)  Height: 5\' 8"  (1.727 m)   GENERAL- alert, co-operative, appears as stated age, not in any distress. RESP- no increased work of  breathing EXTREMITIES- Right knee with no point tenderness, with large joint effusion settling in lateral suprapatellar region. ROM intact. SKIN- Warm, dry, no rash or lesion.  POC  U/S reveals large right knee joint effusion with largest collection seen at <2cm depth in lateral suprapatellar area.  Assessment & Plan:   See Encounters Tab for problem based charting.   Patient seen with Dr. Nilsa Nutting, MD Internal Medicine PGY2

## 2017-12-02 NOTE — Progress Notes (Signed)
[facility] procedure note:  Right knee arthrocentesis and corticosteroid injection  Knee Arthrocentesis with Injection Procedure Note  Diagnosis: right knee joint effusion 2/2 osteoarthritis  Indications: Symptomatic relief of large effusion and Symptom relief from osteoarthritis  Anesthesia: Lidocaine 1% without epinephrine  Procedure Details   Point of care ultrasound was used to identify the joint effusion and plan needle trajectory and depth. Consent was obtained for the procedure. The joint was prepped with Betadine. A 22 gauge needle was inserted into the superior aspect of the joint from a lateral approach to access the suprapatellar pouch. 50 ml of clear yellow fluid was removed from the joint and discarded. 2 ml 1% lidocaine and 40mg  of Triamcinolone was then injected into the joint through the same needle. The needle was removed and the area cleansed and dressed.  Complications:  None; patient tolerated the procedure well.  Procedure was supervised by Dr. Evette Doffing.

## 2017-12-02 NOTE — Patient Instructions (Signed)
You received a knee injection. You may have increased pain in the next day or so which should improve. If you have worsening pain, redness, swelling of the injection site, please come back to the clinic for evaluation.

## 2017-12-02 NOTE — Assessment & Plan Note (Signed)
Patient with large right knee joint effusion 2/2 OA. No s/sx of infection. Risks/benefits of arthrocentesis and corticosteroid injection discussed and patient in agreement.  Plan: --right knee joint arthrocentesis and corticosteroid injection; please see procedure note for details.

## 2017-12-02 NOTE — Progress Notes (Signed)
Internal Medicine Clinic Attending  I saw and evaluated the patient. I was present for the entirety of the procedure.  I personally confirmed the key portions of the history and exam documented by Dr. Jari Favre and I reviewed pertinent patient test results.  The assessment, diagnosis, and plan were formulated together and I agree with the documentation in the resident's note.  Patient well known to me with right knee osteoarthritis and recurring symptomatic effusion. We do a therapeutic arthrocentesis with intra-articular steroids every 3-4 months. She is not interested in another TKA at this time, and reports the injections are beneficial with improved symptoms and functionality. Ultrasound today showed a large suprapatellar effusion, which was free flowing. Ultrasound was used in this procedure to guide needle placement.   Short axis view of the right lateral suprapatellar bursa showing a large hypoechoic free flowing effusion, which was the site of the arthrocentesis.

## 2017-12-09 ENCOUNTER — Other Ambulatory Visit: Payer: Self-pay | Admitting: Internal Medicine

## 2017-12-26 ENCOUNTER — Encounter: Payer: Self-pay | Admitting: Neurology

## 2017-12-26 ENCOUNTER — Ambulatory Visit (INDEPENDENT_AMBULATORY_CARE_PROVIDER_SITE_OTHER): Payer: Medicare Other | Admitting: Neurology

## 2017-12-26 ENCOUNTER — Other Ambulatory Visit: Payer: Medicare Other

## 2017-12-26 ENCOUNTER — Ambulatory Visit: Payer: Self-pay | Admitting: Obstetrics & Gynecology

## 2017-12-26 VITALS — BP 110/84 | HR 88 | Ht 68.0 in | Wt 319.0 lb

## 2017-12-26 DIAGNOSIS — G629 Polyneuropathy, unspecified: Secondary | ICD-10-CM | POA: Diagnosis not present

## 2017-12-26 LAB — TSH: TSH: 1.04 u[IU]/mL (ref 0.35–4.50)

## 2017-12-26 LAB — VITAMIN B12: Vitamin B-12: 438 pg/mL (ref 211–911)

## 2017-12-26 NOTE — Progress Notes (Signed)
[facility] Clinic Note - Initial Visit   Date: 12/26/17  [name] MRN: [id number] DOB: [birth year]   Dear Dr. Jacqualyn Posey:  Thank you for your kind referral of [name] for consultation of neuropathy. Although her history is well known to you, please allow [place] to reiterate it for the purpose of our medical record. The patient was accompanied to the clinic by self.   History of Present Illness: [name] is a 62 y.o. right-handed African American female with hypertension, hyperlipidemia, morbid obesity, and GERD presenting for evaluation of neuropathy.    Starting around 2017, she developed bilateral feet pain, much worse in the left foot.  She has sharp pain in the toes and tingling in the balls of the feet, as well numbness.  She wears ace bandage around her left foot, which significantly improves her tingling.  Her numbness is constant and worse when she sits on the toilet too long. She had left total knee replacement in 2014 and since then, has developed left foot swelling.  She takes tramadol for her right knee pain.   She denies any weakness or falls.  No history of diabetes, heavy alcohol consumption, or family history of neuropathy.  Out-side paper records, electronic medical record, and images have been reviewed where available and summarized as:  NCS/EMG of the left leg 09/20/2017:The electrophysiologic findings are most consistent with a chronic sensorimotor polyneuropathy, axon loss in type, affecting the left lower extremity.  MRI brain wo contrast 09/29/2014:   1. No acute intracranial abnormality.  2. Stable mild white matter disease, advanced for age. The finding is nonspecific but can be seen in the setting of chronic microvascular ischemia, a demyelinating process such as multiple sclerosis, vasculitis, complicated migraine headaches, or as the sequelae of a prior infectious or inflammatory process.  Lab Results  Component Value Date    HGBA1C 4.8 09/05/2017    Past Medical History:  Diagnosis Date  . Acute urinary retention s/p Foley 01/07/2012  . CAD (coronary artery disease)    a. 12/2013 s/p CABG x 5 Dr. Darcey Nora (LIMA to LAD, SVG to diagonal, SVG to OM1, SVG to OM2, SVG to PDA)  . Carotid arterial disease ([place])    a. 12/2013 Carotid U/S: 1-39% bilat ICA stenosis.  . Carpal tunnel syndrome, bilateral   . Hemorrhoids, internal, with bleeding & prolapse 12/13/2011  . Hernia, abdominal   . History of blood transfusion    CABG and hysterectomy  . History of kidney stones   . Hypertension   . Iron deficiency 12-07-2011  . Keloid scar    a. Sternal Keloid s/p CABG with ongoing pain.  . Morbid obesity ([place])   . Myocardial infarct ([place])   . Neuropathy   . Osteoarthritis   . Osteoporosis   . Seasonal allergies   . Sleep apnea    no CPAP machine; sleep study 02/2010 REM AHI 61.7/hr, total sleep REM 14.8/hr  . Uterine cancer ([place]) 7/15   clinical stage IA grade 1 endometrioid endometrial cancer    Past Surgical History:  Procedure Laterality Date  . ABDOMINAL HYSTERECTOMY    . BREAST REDUCTION SURGERY Bilateral 10/10/2015   Procedure: BILATERAL MAMMARY REDUCTION  (BREAST) WITH FREE NIPPLE GRAFT;  Surgeon: Irene Limbo, MD;  Location: [facility];  Service: Plastics;  Laterality: Bilateral;  . BREAST SURGERY     reduction  . CARDIAC CATHETERIZATION    . COLONOSCOPY  2009  . COLONOSCOPY W/ BIOPSIES AND POLYPECTOMY    .  CORONARY ARTERY BYPASS GRAFT N/A 01/09/2014   Procedure: CORONARY ARTERY BYPASS GRAFTING (CABG) x 5 using left internal mammary artery and right leg greater saphenous vein harvested endoscopically;  Surgeon: Ivin Poot, MD;  Location: [facility];  Service: Open Heart Surgery;  Laterality: N/A;  please use bed extenders and breast binder  . INTRAOPERATIVE TRANSESOPHAGEAL ECHOCARDIOGRAM N/A 01/09/2014   Procedure: INTRAOPERATIVE TRANSESOPHAGEAL ECHOCARDIOGRAM;  Surgeon: Ivin Poot, MD;  Location: [facility];  Service: Open Heart Surgery;  Laterality: N/A;  . KNEE ARTHROSCOPY  1991  . KNEE SURGERY Bilateral 1999  . LEFT HEART CATHETERIZATION WITH CORONARY ANGIOGRAM N/A 01/04/2014   Procedure: LEFT HEART CATHETERIZATION WITH CORONARY ANGIOGRAM;  Surgeon: Sinclair Grooms, MD;  Location: [facility] CATH LAB;  Service: Cardiovascular;  Laterality: N/A;  . LESION EXCISION WITH COMPLEX REPAIR N/A 05/07/2016   Procedure: COMPLEX REPAIR OF CHEST 16 CM;  Surgeon: Irene Limbo, MD;  Location: [facility];  Service: Plastics;  Laterality: N/A;  . MULTIPLE EXTRACTIONS WITH ALVEOLOPLASTY N/A 04/11/2014   Procedure: Extraction of tooth #'s 1,2,8,16 with alveoloplasty, maxillary tuberosity reductions, and gross debridement of remaining teeth.;  Surgeon: Lenn Cal, DDS;  Location: [facility];  Service: Oral Surgery;  Laterality: N/A;  . MULTIPLE TOOTH EXTRACTIONS    . NM MYOCAR PERF WALL MOTION  01/2010   dipyridamole myoview - moderate perfusion defect in basal inferoseptal, basal inferior, mid inferoseptal, mid inferior, apical inferior region; EF 56%  . TEE WITHOUT CARDIOVERSION  01/2010   EF 60-65%, small, flat, non-infiltrating, calcified, fixed apical/septal mass  . TOTAL KNEE ARTHROPLASTY Left 04/29/2011  . transanal hemorrhoidal dearterliaization  01/06/12   with external hemorrhoid removal  . UNILATERAL SALPINGECTOMY Right 06/03/2014   Procedure: UNILATERAL SALPINGECTOMY;  Surgeon: Lavonia Drafts, MD;  Location: [facility];  Service: Gynecology;  Laterality: Right;  Marland Kitchen VAGINAL HYSTERECTOMY N/A 06/03/2014   Procedure: HYSTERECTOMY VAGINAL;  Surgeon: Lavonia Drafts, MD;  Location: [facility];  Service: Gynecology;  Laterality: N/A;  . WISDOM TOOTH EXTRACTION       Medications:  Outpatient Encounter Medications as of 12/26/2017  Medication Sig  . albuterol (PROVENTIL HFA;VENTOLIN HFA) 108 (90 BASE) MCG/ACT inhaler Inhale 2 puffs into the lungs every 6 (six) hours as needed for wheezing or shortness of breath.   Marland Kitchen aspirin 81 MG chewable tablet Chew 81 mg by mouth daily.  . chlorthalidone (HYGROTON) 25 MG tablet Take 1 tablet (25 mg total) by mouth daily.  . cycloSPORINE (RESTASIS) 0.05 % ophthalmic emulsion Place 1 drop into both eyes 2 (two) times daily.  Marland Kitchen esomeprazole (NEXIUM) 40 MG capsule Take 1 capsule (40 mg total) by mouth daily at 12 noon.  Marland Kitchen ipratropium (ATROVENT) 0.06 % nasal spray Place 2 sprays into both nostrils 4 (four) times daily. (Patient taking differently: Place 2 sprays into both nostrils 4 (four) times daily as needed for rhinitis. )  . irbesartan (AVAPRO) 300 MG tablet Take 1 tablet (300 mg total) by mouth daily.  . metoprolol tartrate (LOPRESSOR) 25 MG tablet TAKE 1.5 TABLETS BY MOUTH TWICE A DAY  . Multiple Vitamin (MULTIVITAMIN WITH MINERALS) TABS tablet Take 1 tablet by mouth every Monday, Wednesday, and Friday.  Salley Scarlet FORMULARY [facility]  Combination Pain Cream -  Baclofen 2%, Doxepin 5%, Gabapentin 6%, Topiramate 2%, Pentoxifylline 3% Apply 1-2 grams to affected area 3-4 times daily Qty. 120 gm 3 refills  . rosuvastatin (CRESTOR) 20 MG tablet TAKE 1 TABLET BY MOUTH EVERY DAY  . traMADol Veatrice Bourbon)  50 MG tablet Take 25 mg by mouth every 6 (six) hours as needed.   No facility-administered encounter medications on file as of 12/26/2017.      Allergies:  Allergies  Allergen Reactions  . Cephalosporins Anaphylaxis, Swelling and Other (See Comments)    Tongue swelling, gum pain  . Eicosapentaenoic Acid (Epa) Anaphylaxis and Shortness Of Breath  . Fish-Derived Products Anaphylaxis and Shortness Of Breath  . Peanuts [Peanut Oil] Anaphylaxis, Shortness Of Breath and Other (See Comments)    Peanut butter  . Shrimp [Shellfish Allergy] Anaphylaxis    Pt states her throat will swell if she eats shrimp.  . Latex Itching  . Metronidazole Other (See Comments)    Palpitations, mild SOB, metallic taste, dry mouth, high blood pressure  . Ciprofloxacin Other (See Comments)     Gaging and achy  . Lisinopril Cough       . Naproxen Nausea Only and Other (See Comments)    Headache  . Other Itching and Other (See Comments)     All pain meds make her itch - has to have something to prevent that in addition to receiving med  . Prednisone Other (See Comments)    Heart beating fast       Family History: Family History  Problem Relation Age of Onset  . Heart attack Mother 68  . Heart disease Mother   . Hypertension Mother   . Diabetes Mother   . Hypertension Sister   . Heart failure Sister   . Heart disease Sister   . Diabetes Sister   . Kidney disease Brother        hypertension  . Hypertension Daughter   . Colon cancer Neg Hx     Social History: Social History   Tobacco Use  . Smoking status: Never Smoker  . Smokeless tobacco: Never Used  Substance Use Topics  . Alcohol use: No    Alcohol/week: 0.0 oz  . Drug use: No   Social History   Social History Narrative   Lives alone in a one story home.  Has 2 children.     Retired Regulatory affairs officer.     Education: 2 years of college.    Review of Systems:  CONSTITUTIONAL: No fevers, chills, night sweats, or weight loss.   EYES: No visual changes or eye pain ENT: No hearing changes.  No history of nose bleeds.   RESPIRATORY: No cough, wheezing and shortness of breath.   CARDIOVASCULAR: Negative for chest pain, and palpitations.   GI: Negative for abdominal discomfort, blood in stools or black stools.  No recent change in bowel habits.   GU:  No history of incontinence.   MUSCLOSKELETAL: No history of joint pain +swelling.  No myalgias.   SKIN: Negative for lesions, rash, and itching.   HEMATOLOGY/ONCOLOGY: Negative for prolonged bleeding, bruising easily, and swollen nodes.  No history of cancer.   ENDOCRINE: Negative for cold or heat intolerance, polydipsia or goiter.   PSYCH:  No depression or anxiety symptoms.   NEURO: As Above.   Vital Signs:  BP 110/84   Pulse 88   Ht 5\' 8"  (1.727 m)    Wt (!) 319 lb (144.7 kg)   LMP 01/13/2014 Comment: spotting in Feb and bleeding since April  SpO2 97%   BMI 48.50 kg/m    General Medical Exam:   General:  Well appearing, obese, comfortable.   Eyes/ENT: see cranial nerve examination.   Neck: No masses appreciated.  Full range of motion without  tenderness.  No carotid bruits. Respiratory:  Clear to auscultation, good air entry bilaterally.   Cardiac:  Regular rate and rhythm, no murmur.   Extremities:  No deformities, edema, or skin discoloration.  Skin:  No rashes or lesions.  Neurological Exam: MENTAL STATUS including orientation to time, place, person, recent and remote memory, attention span and concentration, language, and fund of knowledge is normal.  Speech is not dysarthric.  CRANIAL NERVES: II:  No visual field defects.  Unremarkable fundi.   III-IV-VI: Pupils equal round and reactive to light.  Normal conjugate, extra-ocular eye movements in all directions of gaze.  No nystagmus.  No ptosis.   V:  Normal facial sensation.     VII:  Normal facial symmetry and movements.   VIII:  Normal hearing and vestibular function.   IX-X:  Normal palatal movement.   XI:  Normal shoulder shrug and head rotation.   XII:  Normal tongue strength and range of motion, no deviation or fasciculation.  MOTOR:  No atrophy, fasciculations or abnormal movements.  No pronator drift.  Tone is normal.    Right Upper Extremity:    Left Upper Extremity:    Deltoid  5/5   Deltoid  5/5   Biceps  5/5   Biceps  5/5   Triceps  5/5   Triceps  5/5   Wrist extensors  5/5   Wrist extensors  5/5   Wrist flexors  5/5   Wrist flexors  5/5   Finger extensors  5/5   Finger extensors  5/5   Finger flexors  5/5   Finger flexors  5/5   Dorsal interossei  5/5   Dorsal interossei  5/5   Abductor pollicis  5/5   Abductor pollicis  5/5   Tone (Ashworth scale)  0  Tone (Ashworth scale)  0   Right Lower Extremity:    Left Lower Extremity:    Hip flexors  5/5   Hip  flexors  5/5   Hip extensors  5/5   Hip extensors  5/5   Knee flexors  5/5   Knee flexors  5/5   Knee extensors  5/5   Knee extensors  5/5   Dorsiflexors  5/5   Dorsiflexors  5/5   Plantarflexors  5/5   Plantarflexors  5/5   Toe extensors  5/5   Toe extensors  5/5   Toe flexors  5/5   Toe flexors  5/5   Tone (Ashworth scale)  0  Tone (Ashworth scale)  0   MSRs:  Right                                                                 Left brachioradialis 2+  brachioradialis 2+  biceps 2+  biceps 2+  triceps 2+  triceps 2+  patellar 2+  patellar 2+  ankle jerk 0  ankle jerk 0  Hoffman no  Hoffman no  plantar response down  plantar response down   SENSORY:  Reduced vibration and temperature over the toes bilaterally, worse on the left. There is mild sway with Rhomberg testing.  COORDINATION/GAIT: Normal finger-to- nose-finger. Intact rapid alternating movements bilaterally.  Gait is wide-based due to body habitus, stable and unassisted.  IMPRESSION: Peripheral neuropathy, most likely idiopathic.  She has left >> right foot paresthesias and numbness, confirmed by electrodiagnostic testing of the left leg.  Exam shows mild distal sensory loss and arreflexia.  Strength is intact.  I had extensive discussion with the patient regarding the pathogenesis, etiology, management, and natural course of neuropathy. Neuropathy tends to be slowly progressive, especially if a treatable etiology is not identified.  I would like to test for treatable causes of neuropathy. I discussed that in the vast majority of cases, despite checking for reversible causes, we are unable to find the underlying etiology and management is symptomatic.  Check labs vitamin B12, TSH, copper, SPEP with IFE.  Fortunately, she is not having severe pain or paresthesias, so medication is not indicated.  Patient educated on daily examination of the soles of the feet as well as fall precautions.  Return to clinic as needed  Thank you  for allowing me to participate in patient's care.  If I can answer any additional questions, I would be pleased to do so.    Sincerely,    Angelito Hopping K. Posey Pronto, DO

## 2017-12-26 NOTE — Patient Instructions (Addendum)
Check labs  Examine soles of feet daily  Return to clinic as needed

## 2017-12-28 LAB — PROTEIN ELECTROPHORESIS, SERUM
ALBUMIN ELP: 3.4 g/dL — AB (ref 3.8–4.8)
ALPHA 2: 1 g/dL — AB (ref 0.5–0.9)
Alpha 1: 0.4 g/dL — ABNORMAL HIGH (ref 0.2–0.3)
BETA GLOBULIN: 0.4 g/dL (ref 0.4–0.6)
Beta 2: 0.6 g/dL — ABNORMAL HIGH (ref 0.2–0.5)
Gamma Globulin: 1.3 g/dL (ref 0.8–1.7)
TOTAL PROTEIN: 7.1 g/dL (ref 6.1–8.1)

## 2017-12-28 LAB — IMMUNOFIXATION ELECTROPHORESIS
IgG (Immunoglobin G), Serum: 1328 mg/dL (ref 694–1618)
IgM, Serum: 151 mg/dL (ref 48–271)
Immunofix Electr Int: NOT DETECTED
Immunoglobulin A: 502 mg/dL — ABNORMAL HIGH (ref 81–463)

## 2017-12-28 LAB — COPPER, SERUM: Copper: 148 ug/dL (ref 70–175)

## 2017-12-29 ENCOUNTER — Telehealth: Payer: Self-pay | Admitting: *Deleted

## 2017-12-29 NOTE — Telephone Encounter (Signed)
-----   Message from Alda Berthold, DO sent at 12/29/2017  8:03 AM EST ----- Please notify patient lab are within normal limits.  Thank you.

## 2017-12-29 NOTE — Telephone Encounter (Signed)
Patient notified

## 2018-01-16 ENCOUNTER — Encounter: Payer: Self-pay | Admitting: Student in an Organized Health Care Education/Training Program

## 2018-01-16 ENCOUNTER — Ambulatory Visit (INDEPENDENT_AMBULATORY_CARE_PROVIDER_SITE_OTHER): Payer: Medicare Other | Admitting: Student in an Organized Health Care Education/Training Program

## 2018-01-16 VITALS — BP 122/66 | HR 63 | Temp 98.7°F | Ht 68.0 in | Wt 325.3 lb

## 2018-01-16 DIAGNOSIS — M1711 Unilateral primary osteoarthritis, right knee: Secondary | ICD-10-CM | POA: Diagnosis not present

## 2018-01-16 DIAGNOSIS — K219 Gastro-esophageal reflux disease without esophagitis: Secondary | ICD-10-CM

## 2018-01-16 DIAGNOSIS — Z6841 Body Mass Index (BMI) 40.0 and over, adult: Secondary | ICD-10-CM | POA: Diagnosis not present

## 2018-01-16 DIAGNOSIS — Z96652 Presence of left artificial knee joint: Secondary | ICD-10-CM | POA: Diagnosis not present

## 2018-01-16 DIAGNOSIS — Z79899 Other long term (current) drug therapy: Secondary | ICD-10-CM

## 2018-01-16 DIAGNOSIS — G8929 Other chronic pain: Secondary | ICD-10-CM | POA: Diagnosis not present

## 2018-01-16 DIAGNOSIS — I10 Essential (primary) hypertension: Secondary | ICD-10-CM

## 2018-01-16 DIAGNOSIS — Z951 Presence of aortocoronary bypass graft: Secondary | ICD-10-CM | POA: Diagnosis not present

## 2018-01-16 MED ORDER — ESOMEPRAZOLE MAGNESIUM 40 MG PO CPDR: 40.0000 mg | DELAYED_RELEASE_CAPSULE | Freq: Every day | ORAL | 1 refills | Status: DC

## 2018-01-16 NOTE — Assessment & Plan Note (Signed)
Esophageal reflux symptoms are stable.  She continues to have bothersome reflux if she does not use a PPI.  No adverse side effects from these medications so far.  plan is to continue his omeprazole 40 mg once daily.

## 2018-01-16 NOTE — Assessment & Plan Note (Signed)
Blood pressure is finally well controlled today.  Plan to continue chlorthalidone 25 mg, irbesartan 300 mg daily, and metoprolol 37.5 mg twice daily.

## 2018-01-16 NOTE — Assessment & Plan Note (Signed)
Right knee osteoarthritis with recurrent effusions, on exam she has a mild effusion today.  We last drained and injected it with a steroid about 6 weeks ago in January.  Functionally doing well so far today.  I advised her to come back as needed for arthrocentesis and steroid injection, earliest we can do it would be April.  Patient is not interested in right total knee replacement, has already had a left knee replacement and feels like her right knee is still more functional than her left currently.

## 2018-01-16 NOTE — Progress Notes (Signed)
   Assessment and Plan:  See Encounters tab for problem-based medical decision making.   __________________________________________________________  HPI:   62 year old woman here for follow-up of hypertension.  She reports fair compliance with her medications.  She says that she misses a dose of her medicine about once or twice a week.  She uses a pillbox but she says she just does not have a very good system for remembering every day.  Overall she feels much better since getting better control over her blood pressure.  Her weight continues to be an issue which she understands.  She has chronic knee pain from her long-standing obesity, had a left knee replacement long time ago and now has recurrent right knee effusions.  She walked in today with the assistance of a cane.  She lives by herself and is independent in all her activities of daily living.  She still drives and does all of her own shopping.  She says that she likes living by herself, denies any recent falls.  Denies any chest pain, pressure, dyspnea on exertion.  No recent fevers or chills.  She had a sinusitis and URI type symptoms a few weeks ago which she says improved by chewing ginger root.   __________________________________________________________  Problem List: Patient Active Problem List   Diagnosis Date Noted  . Atherosclerosis of aorta ([place]) 06/20/2015    Priority: High  . Coronary atherosclerosis of native coronary artery 01/05/2014    Priority: High  . Osteoarthritis of right knee 11/07/2014    Priority: Medium  . Morbid obesity due to excess calories ([place]) 05/10/2012    Priority: Medium  . Obstructive sleep apnea 03/27/2010    Priority: Medium  . Essential hypertension 06/30/2007    Priority: Medium  . Osteoarthritis of left shoulder 10/24/2017    Priority: Low  . Peripheral neuropathy 08/19/2017    Priority: Low  . Health care maintenance 10/09/2014    Priority: Low  . GERD 08/06/2008    Priority: Low  .  Hyperlipidemia 10/31/2007    Priority: Low    Medications: Reconciled today in Epic __________________________________________________________  Physical Exam:  Vital Signs: Vitals:   01/16/18 0929  BP: 122/66  Pulse: 63  Temp: 98.7 F (37.1 C)  TempSrc: Oral  SpO2: 100%  Weight: (!) 325 lb 4.8 oz (147.6 kg)  Height: 5\' 8"  (1.727 m)    Gen: Well appearing, NAD ENT: OP clear without erythema or exudate.  Neck: No cervical LAD, No thyromegaly or nodules, No JVD. CV: RRR, no murmurs, distant heart sounds, hypertrophic midline scar from CABG Ext: Warm, no edema, right knee has a mild effusion laterally Skin: No atypical appearing moles. No rashes

## 2018-01-16 NOTE — Assessment & Plan Note (Signed)
Weight continues to be a problem, especially driving her knee osteoarthritis and probably risk for future cardiac events.  We talked about the importance of improving nutrition and increasing exercise.  I referred her to our nutritionist.

## 2018-01-16 NOTE — Patient Instructions (Signed)
1.  Follow-up with [place] as needed for your right knee pain.  We last did a steroid injection in January, so the next time we can do it would be no sooner than April.  2.  Please follow-up with your cardiologist Dr. Debara Pickett  3.  Continue your blood pressure medications as you are doing, you are doing a great job.

## 2018-01-26 ENCOUNTER — Ambulatory Visit: Payer: Medicare Other | Admitting: Dietician

## 2018-01-26 ENCOUNTER — Encounter (INDEPENDENT_AMBULATORY_CARE_PROVIDER_SITE_OTHER): Payer: Self-pay

## 2018-01-26 NOTE — Patient Instructions (Signed)
Good Job with your weight loss efforts!  I can tell you are on the right track!  Keep those records and look them over to learn about your habits.   Habits are learned and can be unlearned.   See you in April- bring your food record if you want.

## 2018-01-26 NOTE — Progress Notes (Signed)
Medical Nutrition Therapy:  Appt start time: 4431 end time:  5400. Visit # 1  Assessment:  Primary concerns today: help with weight loss [name] reports having lost 26# by drinking less soda, eating less starch and more fruits and vegetables. She gained 6# back and decided maybe she should get some help. She is motivated and has self confidence she can do it because she has already lost weight and knows she can do it,  She attributes needing less medicine to her weight loss and because it helps her knee pain. She is thinking about . Starting a water exercise as well as soon as she figures out what to wear when she goes. She reads labels but is not familiar with the importance of calories, carbs protein or fat.  She'd like to avoid  Preferred Learning Style: No preference indicated  Learning Readiness: Ready and Change in progress  ANTHROPOMETRICS: refused today, but from past records Estimated body mass index is 49.46 kg/m as calculated from the following:   Height as of 01/16/18: 5' 8"  (1.727 m).   Weight as of 01/16/18: 325 lb 4.8 oz (147.6 kg).  WEIGHT HISTORY: reports lost 26# then gained 6 # back SLEEP:need to assess at future visit  MEDICATIONS:  Current Outpatient Medications on File Prior to Visit  Medication Sig Dispense Refill  . albuterol (PROVENTIL HFA;VENTOLIN HFA) 108 (90 BASE) MCG/ACT inhaler Inhale 2 puffs into the lungs every 6 (six) hours as needed for wheezing or shortness of breath. 1 Inhaler 2  . aspirin 81 MG chewable tablet Chew 81 mg by mouth daily.    . chlorthalidone (HYGROTON) 25 MG tablet Take 1 tablet (25 mg total) by mouth daily. 30 tablet 5  . cycloSPORINE (RESTASIS) 0.05 % ophthalmic emulsion Place 1 drop into both eyes 2 (two) times daily.    Marland Kitchen esomeprazole (NEXIUM) 40 MG capsule Take 1 capsule (40 mg total) by mouth daily at 12 noon. 90 capsule 1  . ipratropium (ATROVENT) 0.06 % nasal spray Place 2 sprays into both nostrils 4 (four) times daily. (Patient  taking differently: Place 2 sprays into both nostrils 4 (four) times daily as needed for rhinitis. ) 15 mL 1  . irbesartan (AVAPRO) 300 MG tablet Take 1 tablet (300 mg total) by mouth daily. 30 tablet 5  . metoprolol tartrate (LOPRESSOR) 25 MG tablet TAKE 1.5 TABLETS BY MOUTH TWICE A DAY 270 tablet 2  . Multiple Vitamin (MULTIVITAMIN WITH MINERALS) TABS tablet Take 1 tablet by mouth every Monday, Wednesday, and Friday.    [name] FORMULARY [facility]  Combination Pain Cream -  Baclofen 2%, Doxepin 5%, Gabapentin 6%, Topiramate 2%, Pentoxifylline 3% Apply 1-2 grams to affected area 3-4 times daily Qty. 120 gm 3 refills    . rosuvastatin (CRESTOR) 20 MG tablet TAKE 1 TABLET BY MOUTH EVERY DAY 30 tablet 9  . traMADol (ULTRAM) 50 MG tablet Take 25 mg by mouth every 6 (six) hours as needed.     No current facility-administered medications on file prior to visit.    DIETARY INTAKE: Usual eating pattern includes 2-3 meals and unsure how many  snacks per day. Everyday foods include old fashioned oats, egg biscuit, oatmeal, bananas, almond butter, almond milk, mangoes, raisins, strawberries mac and cheese, rice. .   Beverages: water, regular soda,  Progress Towards Goal(s):  In progress.   Nutritional Diagnosis:  NB-1.1 Food and nutrition-related knowledge deficit As related to lack of sufficient nutrition training.  As evidenced by her  report and questions.    Intervention:  Nutrition education about nutrition basics, weight loss, brief label readings education, how to keep a food record. Coordination of care:   Teaching Method Utilized: Visual, Auditory, Hands on Handouts given during visit include:Weight matters book on weight loss Barriers to learning/adherence to lifestyle change: competing values- needs to discuss support plan in the furture Demonstrated degree of understanding via:  Teach Back   Monitoring/Evaluation:  Dietary intake, exercise, food record, and body weight in 1  month(s)  [name], RD 01/26/2018 5:31 PM. .

## 2018-02-02 ENCOUNTER — Encounter: Payer: Self-pay | Admitting: Obstetrics & Gynecology

## 2018-02-02 ENCOUNTER — Ambulatory Visit (INDEPENDENT_AMBULATORY_CARE_PROVIDER_SITE_OTHER): Payer: Medicare Other | Admitting: Obstetrics & Gynecology

## 2018-02-02 VITALS — BP 115/65 | HR 69 | Ht 68.0 in | Wt 319.9 lb

## 2018-02-02 DIAGNOSIS — Z1239 Encounter for other screening for malignant neoplasm of breast: Secondary | ICD-10-CM

## 2018-02-02 DIAGNOSIS — Z01419 Encounter for gynecological examination (general) (routine) without abnormal findings: Secondary | ICD-10-CM

## 2018-02-02 NOTE — Progress Notes (Signed)
Subjective:     [name] is a 62 y.o. female here for a routine exam.  Current complaints: no. Pt has had a reduction mammoplasty since her last visit here. She reports pressure in the vaginal area. She does not reports leaking but, reports urgency. She thinks that she is at the point where she wants something done.       Gynecologic History Patient's last menstrual period was 01/13/2014. Contraception: post menopausal status Last Pap: pt is s/p hyst.  Last mammogram: 05/05/2016. Results were: birad 2 suspect normal. rec f/u in 1 year  Obstetric History OB History  Gravida Para Term Preterm AB Living  2 2 2  0 0 2  SAB TAB Ectopic Multiple Live Births  0 0 0 0      # Outcome Date GA Lbr Len/2nd Weight Sex Delivery Anes PTL Lv  2 Term      Vag-Spont     1 Term      Vag-Spont        The following portions of the patient's history were reviewed and updated as appropriate: allergies, current medications, past family history, past medical history, past social history, past surgical history and problem list.  Review of Systems  Pt is s/p reduction mammoplasty in 2017 S/p colonoscopy in 05/2017 S/p sleep study in 07/2017   Objective:  BP 115/65   Pulse 69   Ht 5\' 8"  (1.727 m)   Wt (!) 319 lb 14.4 oz (145.1 kg)   LMP 01/13/2014 Comment: spotting in Feb and bleeding since April  BMI 48.64 kg/m  General Appearance:    Alert, cooperative, no distress, appears stated age  Head:    Normocephalic, without obvious abnormality, atraumatic  Eyes:    conjunctiva/corneas clear, EOM's intact, both eyes  Ears:    Normal external ear canals, both ears  Nose:   Nares normal, septum midline, mucosa normal, no drainage    or sinus tenderness  Throat:   Lips, mucosa, and tongue normal; teeth and gums normal  Neck:   Supple, symmetrical, trachea midline, no adenopathy;    thyroid:  no enlargement/tenderness/nodules  Back:     Symmetric, no curvature, ROM normal, no CVA tenderness  Lungs:      Clear to auscultation bilaterally, respirations unlabored  Chest Wall:    No tenderness or deformity   Heart:    Regular rate and rhythm, S1 and S2 normal, no murmur, rub   or gallop  Breast Exam:    No tenderness, masses, or nipple abnormality. Scars noted from breast surgery new since last exam.   Abdomen:     Soft, non-tender, bowel sounds active all four quadrants,    no masses, no organomegaly; large pannus noed  Genitalia:    Normal female without lesion, discharge or tenderness     Extremities:   Extremities normal, atraumatic, no cyanosis or edema  Pulses:   2+ and symmetric all extremities  Skin:   Skin color, texture, turgor normal, no rashes or lesions     Assessment:    Healthy female exam.   Pelvic organ prolapse no leakage.  Breast cancer screening   Plan:    Follow up in: 1 year.    Screening mammogram F/u for pessary fitting   [name], M.D., Cherlynn June

## 2018-02-02 NOTE — Progress Notes (Signed)
Pt states has been urinating more than normal, pressure in bladder.Still feels like she may start her period.

## 2018-02-02 NOTE — Patient Instructions (Signed)
Pelvic Organ Prolapse Pelvic organ prolapse is the stretching, bulging, or dropping of pelvic organs into an abnormal position. It happens when the muscles and tissues that surround and support pelvic structures are stretched or weak. Pelvic organ prolapse can involve:  Vagina (vaginal prolapse).  Uterus (uterine prolapse).  Bladder (cystocele).  Rectum (rectocele).  Intestines (enterocele).  When organs other than the vagina are involved, they often bulge into the vagina or protrude from the vagina, depending on how severe the prolapse is. What are the causes? Causes of this condition include:  Pregnancy, labor, and childbirth.  Long-lasting (chronic) cough.  Chronic constipation.  Obesity.  Past pelvic surgery.  Aging. During and after menopause, a decreased production of the hormone estrogen can weaken pelvic ligaments and muscles.  Consistently lifting more than 50 lb (23 kg).  Buildup of fluid in the abdomen due to certain diseases and other conditions.  What are the signs or symptoms? Symptoms of this condition include:  Loss of bladder control when you cough, sneeze, strain, and exercise (stress incontinence). This may be worse immediately following childbirth, and it may gradually improve over time.  Feeling pressure in your pelvis or vagina. This pressure may increase when you cough or when you are having a bowel movement.  A bulge that protrudes from the opening of your vagina or against your vaginal wall. If your uterus protrudes through the opening of your vagina and rubs against your clothing, you may also experience soreness, ulcers, infection, pain, and bleeding.  Increased effort to have a bowel movement or urinate.  Pain in your low back.  Pain, discomfort, or disinterest in sexual intercourse.  Repeated bladder infections (urinary tract infections).  Difficulty inserting or inability to insert a tampon or applicator.  In some people, this  condition does not cause any symptoms. How is this diagnosed? Your health care provider may perform an internal and external vaginal and rectal exam. During the exam, you may be asked to cough and strain while you are lying down, sitting, and standing up. Your health care provider will determine if other tests are required, such as bladder function tests. How is this treated? In most cases, this condition needs to be treated only if it produces symptoms. No treatment is guaranteed to correct the prolapse or relieve the symptoms completely. Treatment may include:  Lifestyle changes, such as: ? Avoiding drinking beverages that contain caffeine. ? Increasing your intake of high-fiber foods. This can help to decrease constipation and straining during bowel movements. ? Emptying your bladder at scheduled times (bladder training therapy). This can help to reduce or avoid urinary incontinence. ? Losing weight if you are overweight or obese.  Estrogen. Estrogen may help mild prolapse by increasing the strength and tone of pelvic floor muscles.  Kegel exercises. These may help mild cases of prolapse by strengthening and tightening the muscles of the pelvic floor.  Pessary insertion. A pessary is a soft, flexible device that is placed into your vagina by your health care provider to help support the vaginal walls and keep pelvic organs in place.  Surgery. This is often the only form of treatment for severe prolapse. Different types of surgeries are available.  Follow these instructions at home:  Wear a sanitary pad or absorbent product if you have urinary incontinence.  Avoid heavy lifting and straining with exercise and work. Do not hold your breath when you perform mild to moderate lifting and exercise activities. Limit your activities as directed by your health care   provider.  Take medicines only as directed by your health care provider.  Perform Kegel exercises as directed by your health care  provider.  If you have a pessary, take care of it as directed by your health care provider. Contact a health care provider if:  Your symptoms interfere with your daily activities or sex life.  You need medicine to help with the discomfort.  You notice bleeding from the vagina that is not related to your period.  You have a fever.  You have pain or bleeding when you urinate.  You have bleeding when you have a bowel movement.  You lose urine when you have sex.  You have chronic constipation.  You have a pessary that falls out.  You have vaginal discharge that has a bad smell.  You have low abdominal pain or cramping that is unusual for you. This information is not intended to replace advice given to you by your health care provider. Make sure you discuss any questions you have with your health care provider. Document Released: 06/05/2014 Document Revised: 04/15/2016 Document Reviewed: 01/21/2014 Elsevier Interactive Patient Education  2018 [employer]  

## 2018-02-06 ENCOUNTER — Telehealth: Payer: Self-pay

## 2018-02-06 NOTE — Telephone Encounter (Signed)
Called Pt to inform of Mammogram Appt. On 02/23/18 @ 4pm. Pt acknowledged understanding.

## 2018-02-22 ENCOUNTER — Ambulatory Visit: Payer: Self-pay

## 2018-02-23 ENCOUNTER — Ambulatory Visit: Payer: Self-pay

## 2018-03-02 ENCOUNTER — Ambulatory Visit
Admission: RE | Admit: 2018-03-02 | Discharge: 2018-03-02 | Disposition: A | Discharge status: Home / Self Care | Payer: Medicare Other | Source: Ambulatory Visit | Attending: Obstetrics & Gynecology | Admitting: Obstetrics & Gynecology

## 2018-03-02 ENCOUNTER — Ambulatory Visit: Payer: Self-pay

## 2018-03-02 DIAGNOSIS — Z1239 Encounter for other screening for malignant neoplasm of breast: Secondary | ICD-10-CM

## 2018-03-02 DIAGNOSIS — Z1231 Encounter for screening mammogram for malignant neoplasm of breast: Secondary | ICD-10-CM | POA: Diagnosis not present

## 2018-03-09 ENCOUNTER — Ambulatory Visit (INDEPENDENT_AMBULATORY_CARE_PROVIDER_SITE_OTHER): Payer: Medicare Other | Admitting: Obstetrics & Gynecology

## 2018-03-09 ENCOUNTER — Ambulatory Visit: Payer: Self-pay | Admitting: Dietician

## 2018-03-09 ENCOUNTER — Encounter: Payer: Self-pay | Admitting: Obstetrics & Gynecology

## 2018-03-09 VITALS — BP 126/61 | HR 68 | Ht 68.0 in | Wt 320.0 lb

## 2018-03-09 DIAGNOSIS — R350 Frequency of micturition: Secondary | ICD-10-CM

## 2018-03-09 DIAGNOSIS — N819 Female genital prolapse, unspecified: Secondary | ICD-10-CM

## 2018-03-09 MED ORDER — OXYBUTYNIN CHLORIDE ER 5 MG PO TB24: 5.0000 mg | ORAL_TABLET | Freq: Every day | ORAL | 2 refills | Status: DC

## 2018-03-09 NOTE — Patient Instructions (Signed)

## 2018-03-09 NOTE — Progress Notes (Signed)
History:  62 y.o. [id number] here today for pessary fitting. Pt reports that she no longer feels a bulge but, she reports more urgency sx. She says that she thinks that this is more her problem.  She does not feel that she is really ready for the pessary. She still wanted to try it because she is quite worried that it will come back.   The following portions of the patient's history were reviewed and updated as appropriate: allergies, current medications, past family history, past medical history, past social history, past surgical history and problem list.  Review of Systems:  Pertinent items are noted in HPI.    Objective:  Physical Exam Blood pressure 126/61, pulse 68, height 5\' 8"  (1.727 m), weight (!) 320 lb (145.2 kg), last menstrual period 01/13/2014.  CONSTITUTIONAL: Well-developed, well-nourished female in no acute distress.  HENT:  Normocephalic, atraumatic EYES: Conjunctivae and EOM are normal. No scleral icterus.  NECK: Normal range of motion SKIN: Skin is warm and dry. No rash noted. Not diaphoretic.No pallor. [place]: Alert and oriented to person, place, and time. Normal coordination.   PESSARY FITTING AND PLACEMENT During pelvic exam, patient was examined. Her vaginal introitus size, vaginal length, and prolapse stage wereused to guide selection of pessary type and size. After evaluation, a 3 1/8 ring pessary with urethral button. The 3 1/8 inch ring with support pessary fitting guide was placed and patient walked around room with it in place.  No prolapse was noted in standing, or in lithotomy position even after Valsalva.  The fitter was removed   Labs and Imaging Mm Screening Breast Tomo Bilateral  Result Date: 03/03/2018 CLINICAL DATA:  Screening. EXAM: DIGITAL SCREENING BILATERAL MAMMOGRAM WITH TOMO AND CAD COMPARISON:  Previous exam(s). ACR Breast Density Category b: There are scattered areas of fibroglandular density. FINDINGS: There are no findings suspicious for  malignancy. Images were processed with CAD. IMPRESSION: No mammographic evidence of malignancy. A result letter of this screening mammogram will be mailed directly to the patient. RECOMMENDATION: Screening mammogram in one year. (Code:[id number]) BI-RADS CATEGORY  1: Negative. Electronically Signed   By: Abelardo Diesel M.D.   On: 03/03/2018 10:48    Assessment & Plan:  Pelvic organ prolapse- grade 2  Pt would like to hold on the pessary for now   Urinary freq  Ditropan XL 72m gq day  F/u in 6 weeks or sooner prn  Total face-to-face time with patient was 30 min.  Greater than 50% was spent in counseling and coordination of care with the patient.    Karion Cudd L. Harraway-Smith, M.D., Cherlynn June

## 2018-03-13 ENCOUNTER — Encounter: Payer: Self-pay | Admitting: Obstetrics & Gynecology

## 2018-03-16 ENCOUNTER — Encounter: Payer: Self-pay | Admitting: Dietician

## 2018-03-16 ENCOUNTER — Ambulatory Visit: Payer: Medicare Other | Admitting: Dietician

## 2018-03-16 DIAGNOSIS — Z713 Dietary counseling and surveillance: Secondary | ICD-10-CM

## 2018-03-16 NOTE — Progress Notes (Signed)
Medical Nutrition Therapy:  Appt start time: 3716 end time:  9678. Visit # 2  Assessment:  Primary concerns today: help with weight loss Ms. Trippett reports eating healthier amounts and foods using the plate method of meal planning and having lost 6#. She has not started a water exercise or any physical activity. She hopes to start increasing her activity after her knee injection on Monday. She'd like to avoid more surgery. She says she kept food records for a few days then last them.  Her goals: weight loss by increasing her activity and decreasing her calorie intake 25# ( <300#) in 3 months By 5/29- 8# decrease to 311#  ANTHROPOMETRICS: Estimated body mass index is 48.6 kg/m as calculated from the following:   Height as of 03/09/18: _0  (1.727 m).   Weight as of this encounter: 319 lb 9.6 oz (145 kg).   SLEEP:need to assess at future visit  MEDICATIONS:  Current Outpatient Medications on File Prior to Visit  Medication Sig Dispense Refill  . Aspirin-Salicylamide-Caffeine (BC HEADACHE POWDER PO) Take by mouth.    . chlorthalidone (HYGROTON) 25 MG tablet Take 1 tablet (25 mg total) by mouth daily. 30 tablet 5  . esomeprazole (NEXIUM) 40 MG capsule Take 1 capsule (40 mg total) by mouth daily at 12 noon. 90 capsule 1  . ipratropium (ATROVENT) 0.06 % nasal spray Place 2 sprays into both nostrils 4 (four) times daily. (Patient taking differently: Place 2 sprays into both nostrils 4 (four) times daily as needed for rhinitis. ) 15 mL 1  . irbesartan (AVAPRO) 300 MG tablet Take 1 tablet (300 mg total) by mouth daily. 30 tablet 5  . metoprolol tartrate (LOPRESSOR) 25 MG tablet TAKE 1.5 TABLETS BY MOUTH TWICE A DAY 270 tablet 2  . Multiple Vitamin (MULTIVITAMIN WITH MINERALS) TABS tablet Take 1 tablet by mouth every Monday, Wednesday, and Friday.    Marland Kitchen oxybutynin (DITROPAN-XL) 5 MG 24 hr tablet Take 1 tablet (5 mg total) by mouth at bedtime. 30 tablet 2  . rosuvastatin (CRESTOR) 20 MG tablet TAKE 1  TABLET BY MOUTH EVERY DAY 30 tablet 9   No current facility-administered medications on file prior to visit.    DIETARY INTAKE: Usual eating pattern includes 2-3 meals and unsure how many  snacks per day. Everyday foods include old fashioned oats, egg biscuit, oatmeal, bananas, almond butter, almond milk, mangoes, raisins, strawberries mac and cheese, rice.  Breakfast- 11 am- oatmeal, chicken, salad Snack- 2 PM banana bread Dinner- 6-7 PM chicken wings, green beans, cornbread, cake no icing Snack- ?    Beverages: water, 1/2 bottle regular soda one time in past month  Progress Towards Goal(s):  In progress.   Nutritional Diagnosis:  NB-1.1 Food and nutrition-related knowledge deficit As related to lack of sufficient nutrition training.  As evidenced by her report and questions.    Intervention:  Nutrition education about self monitoring, calorie counting,  how to keep a food record. Suggested she check with doctors about types of activity that are best for her heart and knees.  Coordination of care: would appreciate physical activity guidance   Teaching Method Utilized: Visual, Auditory, Hands on Handouts given during visit include:Weight matters book on weight loss Barriers to learning/adherence to lifestyle change: competing values- needs to discuss support plan in the furture Demonstrated degree of understanding via:  Teach Back   Monitoring/Evaluation:  Dietary intake, exercise, food record, and body weight in 1 month(s)  Debera Lat, RD 03/16/2018 3:49 PM. .

## 2018-03-20 ENCOUNTER — Ambulatory Visit (INDEPENDENT_AMBULATORY_CARE_PROVIDER_SITE_OTHER): Payer: Medicare Other | Admitting: Internal Medicine

## 2018-03-20 ENCOUNTER — Encounter: Payer: Self-pay | Admitting: Internal Medicine

## 2018-03-20 ENCOUNTER — Other Ambulatory Visit: Payer: Self-pay | Admitting: Student in an Organized Health Care Education/Training Program

## 2018-03-20 DIAGNOSIS — M1711 Unilateral primary osteoarthritis, right knee: Secondary | ICD-10-CM

## 2018-03-20 DIAGNOSIS — I10 Essential (primary) hypertension: Secondary | ICD-10-CM | POA: Diagnosis not present

## 2018-03-20 DIAGNOSIS — Z951 Presence of aortocoronary bypass graft: Secondary | ICD-10-CM | POA: Diagnosis not present

## 2018-03-20 DIAGNOSIS — I2510 Atherosclerotic heart disease of native coronary artery without angina pectoris: Secondary | ICD-10-CM

## 2018-03-20 DIAGNOSIS — Z96652 Presence of left artificial knee joint: Secondary | ICD-10-CM

## 2018-03-20 NOTE — Assessment & Plan Note (Addendum)
Assessment Hx of recurrent effusions 2/2 osteoarthritis of right knee. She receives steroid injections every 3-4 months with improvement in her symptoms. She is not interested in surgery at this time. She reports that her first injection provided relief for about 4 months, the next for about 3 months, and the most recent for about 2 weeks. Exam notable for effusion of right knee. Nontender to palpation, but does report mostly medial pain while ambulating.  Aspiration and injection of right knee performed today. Details in procedure note. Discussed that she is not due for another injection until at least 3 months from now.  Plan - RTC in 3 months - Encouraged weight loss

## 2018-03-20 NOTE — Progress Notes (Addendum)
   CC: right knee pain  HPI:  [name] is a 62 y.o. female with PMH of HTN, OA of right knee, and CAD s/p CABG x5 in 2015 who presents for management of right knee pain.  She has known OA of right knee and is s/p left knee TKA. She is not interested in another total knee replacement at this time because she feels her right knee is still functional. She receives therapeutic joint aspiration with intra-articular steroid injection every 3-4 months with improved symptoms and function. Previous fluid analyses without crystals (July and October 2018). Reports she found relief for 4 months from her first injection, 3 months from her second, and 2 weeks for her third (most recent).  Please see the assessment and plan below for the status of the patient's chronic medical problems.  Past Medical History:  Diagnosis Date  . Acute urinary retention s/p Foley 01/07/2012  . CAD (coronary artery disease)    a. 12/2013 s/p CABG x 5 Dr. Darcey Nora (LIMA to LAD, SVG to diagonal, SVG to OM1, SVG to OM2, SVG to PDA)  . Carotid arterial disease ([place])    a. 12/2013 Carotid U/S: 1-39% bilat ICA stenosis.  . Carpal tunnel syndrome, bilateral   . Hemorrhoids, internal, with bleeding & prolapse 12/13/2011  . Hernia, abdominal   . History of blood transfusion    CABG and hysterectomy  . History of kidney stones   . Hypertension   . Iron deficiency 12-07-2011  . Keloid scar    a. Sternal Keloid s/p CABG with ongoing pain.  . Morbid obesity ([place])   . Myocardial infarct ([place])   . Neuropathy   . Osteoarthritis   . Osteoporosis   . Seasonal allergies   . Sleep apnea    no CPAP machine; sleep study 02/2010 REM AHI 61.7/hr, total sleep REM 14.8/hr  . Uterine cancer ([place]) 7/15   clinical stage IA grade 1 endometrioid endometrial cancer   Review of Systems:   GEN: Negative for fevers CV: Negative for chest pain  PULM: Negative for SOB EXT: Positive for pain in medial right knee when ambulating.  Physical  Exam:  Vitals:   03/20/18 1326  BP: 120/78  Pulse: 69  Temp: 98.8 F (37.1 C)  TempSrc: Oral  SpO2: 100%  Weight: (!) 323 lb 1.6 oz (146.6 kg)   GEN: Sitting comfortably in chair in NAD RIGHT KNEE: Effusion of right knee without tenderness to palpation or erythema  Knee Arthrocentesis with Injection Procedure Note  Diagnosis: right knee joint effusion 2/2 osteoarthritis  Indications: Symptomatic relief of large effusion and symptom relief from osteoarthritis  Anesthesia: Lidocaine 1% without epinephrine  Procedure Details   Consent was obtained for the procedure. The joint was prepped with Betadine. An 18 gauge needle was inserted into the superior aspect of the joint from a lateral approach to access the suprapatellar pouch. 65 ml of clear yellow fluid was removed from the joint and discarded. 2 ml 1% lidocaine and 40mg  of Triamcinolone was then injected into the joint through the same needle. The needle was removed and the area cleansed and dressed.  Complications:  None; patient tolerated the procedure well.  Procedure was supervised by Dr. Angelia Mould  Assessment & Plan:   See Encounters Tab for problem based charting.  Patient seen with Dr. Angelia Mould

## 2018-03-20 NOTE — Patient Instructions (Signed)
FOLLOW-UP INSTRUCTIONS When: 3 months For: management of BP What to bring: medications   [name],  It was good to meet you today.  You received a steroid injection in your right knee today. I hope this provides some relief of your pain. The earliest you can receive your next injection would be in 3 months (at the end of July).

## 2018-03-20 NOTE — Progress Notes (Signed)
Internal Medicine Clinic Attending  I saw and evaluated the patient.  I personally confirmed the key portions of the history and exam documented by Dr. Ronalee Red and I reviewed pertinent patient test results.  The assessment, diagnosis, and plan were formulated together and I agree with the documentation in the resident's note. I was present and supervised the entire procedure.

## 2018-04-13 ENCOUNTER — Encounter: Payer: Self-pay | Admitting: Internal Medicine

## 2018-04-13 ENCOUNTER — Ambulatory Visit (INDEPENDENT_AMBULATORY_CARE_PROVIDER_SITE_OTHER): Payer: Medicare Other | Admitting: Internal Medicine

## 2018-04-13 ENCOUNTER — Ambulatory Visit: Payer: Self-pay | Admitting: Dietician

## 2018-04-13 DIAGNOSIS — I10 Essential (primary) hypertension: Secondary | ICD-10-CM | POA: Diagnosis not present

## 2018-04-13 DIAGNOSIS — Z951 Presence of aortocoronary bypass graft: Secondary | ICD-10-CM | POA: Diagnosis not present

## 2018-04-13 DIAGNOSIS — E785 Hyperlipidemia, unspecified: Secondary | ICD-10-CM | POA: Diagnosis not present

## 2018-04-13 NOTE — Patient Instructions (Addendum)
Medication Instructions:   STOP goody powder START aspirin 81mg  once daily  Labwork:  FASTING labs to check cholesterol   Testing/Procedures:  NONE  Follow-Up:  Your physician wants you to follow-up in: ONE YEAR with Dr. Debara Pickett. You will receive a reminder letter in the mail two months in advance. If you don't receive a letter, please call our office to schedule the follow-up appointment.   If you need a refill on your cardiac medications before your next appointment, please call your pharmacy.  Any Other Special Instructions Will Be Listed Below (If Applicable).

## 2018-04-13 NOTE — Progress Notes (Signed)
OFFICE NOTE  Chief Complaint:  Routine follow-up  Primary Care Physician: Axel Filler, MD  HPI:  [name] is a 62 year old African American obese female presented to 01/01/14 with symptoms of unstable angina. I had seen her once in 2012, but not since then.  She recently had been having chest pain and presented urgently when she became short of breath at home and had chest tightness, which started as right arm pain and right chest pain, then moved substernally. She ultimately underwent cardiac catheterization via right radial artery which demonstrated severe three-vessel CAD with graftable target vessels. Echocardiogram showed good LV function with some left ventricular hypertrophy from her hypertension and there does not appear to be significant valve disease. Carotid dopplers showed a 1-39% bilateral carotid artery stenosis. She underwent a CABG x 5 on 01/09/2014. Left internal mammary artery to LAD, saphenous vein graft to diagonal, sequential saphenous vein graft to OM1 and OM2, saphenous vein graft to posterior descending. She had multiple teeth extractions prior to surgery. She did well post op. She was anemic but did not need transfusion. At discharge she went to SNF for rehab-she did not stay the recommended time frame due to BR not being available when she needed it. She was not given discharge medications when she left SNF. She saw Cecilie Kicks, FNP, in followup for her hospitalization and was restarted on some of her medications.   [name] was seen today in the office for follow-up. At her last office visit I cleared her for surgery and she underwent hysterectomy and has had resolution of her bleeding problems. She is now contemplating right knee surgery and also breast reduction surgery. She's in the interim seen in the hospital for palpitations. She reports those have improved with medication adjustment. She's decreased her Lasix now to every other day and I think she  can probably take it as needed. She denies any chest pain worsening shortness of breath. She is undergoing injections to reduce the size of her keloid scar.  I saw [name] back in the office today. She's been undergoing rehabilitation and really wants to continue it. She's managed to lose some weight and feels better. Recently though she's been having increased blood pressures. Does have ranged up to [id number] systolic. She had a small increase in her lisinopril to 10 mg daily with an improvement however this staff does not want to continue her at rehabilitation until her blood pressure is better controlled. She denies any chest pain or palpitations.  [name] returns today for follow-up. She continues in cardiac rehabilitation and is doing well. At his last office visit I increased her lisinopril to 20 mg daily and her blood pressure is now much better controlled. Overall she is feeling well. She is undergoing cesarean injections of the keloid scar that she has in her bypass site. She is concerned about a dilated varicose vein that she's developed at the inferior margin.  Since I last saw her, she is doing well. She underwent breast reductions surgery, which has helped her CABG scar some, but has failed to lose significant weight. BMI is now over 50. She does get short of breath but not worsening. Blood pressure is well-controlled and cough has gone away after switching from ACE-I to ARB. She reports slightly more palpitations recently. She has episodes of palpitations, worse at night that occur 3-4x a month.  07/01/2016  [name] returns today for follow-up. She reports some left leg swelling. She has  a history of left knee surgery and has had swelling since that time. She occasionally gets some swelling in the right leg which is where her saphenous vein grafts were taken. She still reports numbness and tingling around the vein harvest sites. She has had numbness and tingling of the chest which is  exquisitely tender to palpation although she has a "high pain tolerance". She recently underwent revision of the keloid scar to her median sternotomy. That does appear somewhat improved. She denies any worsening shortness of breath. Weight is stable.  03/21/2017  [name] was seen today in follow-up. She recently saw Ignacia Bayley, NP, for evaluation of chest pain. Given her history of coronary artery disease and prior CABG, he was concerned about recurrent ischemia. He ordered a [employer] which she underwent and it turns out was negative for ischemia. I do think a lot of her pain is related to her anterior chest keloid scar. She's also having problems with her knees which aren't about the importance of weight loss today. Recently she's lost a little bit away however she is still at a BMI around 50. Recently her blood pressure is running somewhat higher and her PCP increased her irbesartan from 150 mg to 300 mg daily.  04/13/2018  [name] was seen today in routine follow-up.  Over the past year she is done well.  She denies any further chest pain.  She had had an episode not long after bypass and underwent repeat stress testing which was negative for ischemia.  She continues to have problems with a keloid scar over her median sternotomy.  She plans to see a dermatologist about this.  Blood pressure is been fairly well controlled.  Unfortunately her weight is not decreased significantly.  She has been having problems with arthritis and uses BC powder.  I recommended discontinuing that.  She is not taking daily aspirin which she should be and will start that.  She also uses Aleve and I recommended using that sparingly.  She should consider Tylenol as an alternative.  She had an EKG today showed sinus bradycardia at 54 with very flat T waves and a questionable long QTC of over 650 ms.  On my evaluation, is very difficult to see where the T wave and.  I am not certain that there is actually QT  prolongation, and she is not actually on medications that would cause that.  PMHx:  Past Medical History:  Diagnosis Date  . Acute urinary retention s/p Foley 01/07/2012  . CAD (coronary artery disease)    a. 12/2013 s/p CABG x 5 Dr. Darcey Nora (LIMA to LAD, SVG to diagonal, SVG to OM1, SVG to OM2, SVG to PDA)  . Carotid arterial disease ([place])    a. 12/2013 Carotid U/S: 1-39% bilat ICA stenosis.  . Carpal tunnel syndrome, bilateral   . Hemorrhoids, internal, with bleeding & prolapse 12/13/2011  . Hernia, abdominal   . History of blood transfusion    CABG and hysterectomy  . History of kidney stones   . Hypertension   . Iron deficiency 12-07-2011  . Keloid scar    a. Sternal Keloid s/p CABG with ongoing pain.  . Morbid obesity ([place])   . Myocardial infarct ([place])   . Neuropathy   . Osteoarthritis   . Osteoporosis   . Seasonal allergies   . Sleep apnea    no CPAP machine; sleep study 02/2010 REM AHI 61.7/hr, total sleep REM 14.8/hr  . Uterine cancer ([place]) 7/15  clinical stage IA grade 1 endometrioid endometrial cancer    Past Surgical History:  Procedure Laterality Date  . ABDOMINAL HYSTERECTOMY    . BREAST REDUCTION SURGERY Bilateral 10/10/2015   Procedure: BILATERAL MAMMARY REDUCTION  (BREAST) WITH FREE NIPPLE GRAFT;  Surgeon: Irene Limbo, MD;  Location: [place];  Service: Plastics;  Laterality: Bilateral;  . BREAST SURGERY     reduction  . CARDIAC CATHETERIZATION    . COLONOSCOPY  2009  . COLONOSCOPY W/ BIOPSIES AND POLYPECTOMY    . CORONARY ARTERY BYPASS GRAFT N/A 01/09/2014   Procedure: CORONARY ARTERY BYPASS GRAFTING (CABG) x 5 using left internal mammary artery and right leg greater saphenous vein harvested endoscopically;  Surgeon: Ivin Poot, MD;  Location: [facility];  Service: Open Heart Surgery;  Laterality: N/A;  please use bed extenders and breast binder  . INTRAOPERATIVE TRANSESOPHAGEAL ECHOCARDIOGRAM N/A 01/09/2014   Procedure: INTRAOPERATIVE TRANSESOPHAGEAL  ECHOCARDIOGRAM;  Surgeon: Ivin Poot, MD;  Location: [facility];  Service: Open Heart Surgery;  Laterality: N/A;  . KNEE ARTHROSCOPY  1991  . KNEE SURGERY Bilateral 1999  . LEFT HEART CATHETERIZATION WITH CORONARY ANGIOGRAM N/A 01/04/2014   Procedure: LEFT HEART CATHETERIZATION WITH CORONARY ANGIOGRAM;  Surgeon: Sinclair Grooms, MD;  Location: [facility] CATH LAB;  Service: Cardiovascular;  Laterality: N/A;  . LESION EXCISION WITH COMPLEX REPAIR N/A 05/07/2016   Procedure: COMPLEX REPAIR OF CHEST 16 CM;  Surgeon: Irene Limbo, MD;  Location: [facility];  Service: Plastics;  Laterality: N/A;  . MULTIPLE EXTRACTIONS WITH ALVEOLOPLASTY N/A 04/11/2014   Procedure: Extraction of tooth #'s 1,2,8,16 with alveoloplasty, maxillary tuberosity reductions, and gross debridement of remaining teeth.;  Surgeon: Lenn Cal, DDS;  Location: [facility];  Service: Oral Surgery;  Laterality: N/A;  . MULTIPLE TOOTH EXTRACTIONS    . NM MYOCAR PERF WALL MOTION  01/2010   dipyridamole myoview - moderate perfusion defect in basal inferoseptal, basal inferior, mid inferoseptal, mid inferior, apical inferior region; EF 56%  . TEE WITHOUT CARDIOVERSION  01/2010   EF 60-65%, small, flat, non-infiltrating, calcified, fixed apical/septal mass  . TOTAL KNEE ARTHROPLASTY Left 04/29/2011  . transanal hemorrhoidal dearterliaization  01/06/12   with external hemorrhoid removal  . UNILATERAL SALPINGECTOMY Right 06/03/2014   Procedure: UNILATERAL SALPINGECTOMY;  Surgeon: Lavonia Drafts, MD;  Location: [facility];  Service: Gynecology;  Laterality: Right;  Marland Kitchen VAGINAL HYSTERECTOMY N/A 06/03/2014   Procedure: HYSTERECTOMY VAGINAL;  Surgeon: Lavonia Drafts, MD;  Location: [facility];  Service: Gynecology;  Laterality: N/A;  . WISDOM TOOTH EXTRACTION      FAMHx:  Family History  Problem Relation Age of Onset  . Heart attack Mother 43  . Heart disease Mother   . Hypertension Mother   . Diabetes Mother   . Hypertension Sister   . Heart  failure Sister   . Heart disease Sister   . Diabetes Sister   . Kidney disease Brother        hypertension  . Hypertension Daughter   . Colon cancer Neg Hx     SOCHx:   reports that she has never smoked. She has never used smokeless tobacco. She reports that she does not drink alcohol or use drugs.  ALLERGIES:  Allergies  Allergen Reactions  . Cephalosporins Anaphylaxis, Swelling and Other (See Comments)    Tongue swelling, gum pain  . Eicosapentaenoic Acid (Epa) Anaphylaxis and Shortness Of Breath  . Fish-Derived Products Anaphylaxis and Shortness Of Breath  . Peanuts [Peanut Oil] Anaphylaxis, Shortness Of Breath  and Other (See Comments)    Peanut butter  . Shrimp [Shellfish Allergy] Anaphylaxis    Pt states her throat will swell if she eats shrimp.  . Latex Itching  . Metronidazole Other (See Comments)    Palpitations, mild SOB, metallic taste, dry mouth, high blood pressure  . Ciprofloxacin Other (See Comments)    Gaging and achy  . Lisinopril Cough       . Naproxen Nausea Only and Other (See Comments)    Headache  . Other Itching and Other (See Comments)     All pain meds make her itch - has to have something to prevent that in addition to receiving med  . Prednisone Other (See Comments)    Heart beating fast       ROS: Pertinent items noted in HPI and remainder of comprehensive ROS otherwise negative.  HOME MEDS: Current Outpatient Medications  Medication Sig Dispense Refill  . Aspirin-Salicylamide-Caffeine (BC HEADACHE POWDER PO) Take by mouth.    . chlorthalidone (HYGROTON) 25 MG tablet Take 1 tablet (25 mg total) by mouth daily. 30 tablet 5  . esomeprazole (NEXIUM) 40 MG capsule Take 1 capsule (40 mg total) by mouth daily at 12 noon. 90 capsule 1  . ipratropium (ATROVENT) 0.06 % nasal spray Place 2 sprays into both nostrils 4 (four) times daily. (Patient taking differently: Place 2 sprays into both nostrils 4 (four) times daily as needed for rhinitis. ) 15  mL 1  . irbesartan (AVAPRO) 300 MG tablet Take 1 tablet (300 mg total) by mouth daily. 30 tablet 5  . metoprolol tartrate (LOPRESSOR) 25 MG tablet TAKE 1.5 TABLETS BY MOUTH TWICE A DAY 270 tablet 2  . Multiple Vitamin (MULTIVITAMIN WITH MINERALS) TABS tablet Take 1 tablet by mouth every Monday, Wednesday, and Friday.    . naproxen sodium (ALEVE) 220 MG tablet Take 220 mg by mouth daily as needed.    Marland Kitchen oxybutynin (DITROPAN-XL) 5 MG 24 hr tablet Take 1 tablet (5 mg total) by mouth at bedtime. 30 tablet 2  . rosuvastatin (CRESTOR) 20 MG tablet TAKE 1 TABLET BY MOUTH EVERY DAY 30 tablet 9   No current facility-administered medications for this visit.     LABS/IMAGING: No results found for this or any previous visit (from the past 48 hour(s)). No results found.  VITALS: BP 140/82   Pulse (!) 54   Ht 5\' 8"  (1.727 m)   Wt (!) 322 lb (146.1 kg)   LMP 01/13/2014 Comment: spotting in Feb and bleeding since April  BMI 48.96 kg/m   EXAM: General appearance: alert and no distress Neck: no carotid bruit and no JVD Lungs: clear to auscultation bilaterally Heart: regular rate and rhythm, S1, S2 normal, no murmur, click, rub or gallop Abdomen: soft, non-tender; bowel sounds normal; no masses,  no organomegaly and morbidly obese Extremities: extremities normal, atraumatic, no cyanosis or edema Pulses: 2+ and symmetric Skin: Keloid scar of the midsternal incision site with a dilated varicose vein at the distal aspect Neurologic: Grossly normal  EKG: Sinus bradycardia 54, question prolonged QTC at 650 ms-personally reviewed  ASSESSMENT: 1. Coronary artery disease status post 5 vessel CABG (2015) -low risk Myoview 02/2017 2. Morbid obesity 3. Hypertension - uncontrolled 4. Dyslipidemia 5. OSA, not on cPAP 6. Keloid scar - s/p breast reduction, possible upcoming surgery for scar revision 7. Palpitations 8. Leg edema 9. ?  Prolonged QTC-review suggests this is likely not prolonged due to  flattening of the T wave  PLAN:  1.   Ms. Greenstein is doing well overall however she has not been able to lose any significant weight.  She is not treated with CPAP.  Blood pressure is better controlled.  She continues to have intermittent problems with edema.  She has this elevated QTC today of questionable significance.  I think it is actually not as long as measured on the computer because she has flattening of her T wave.  Most of the EKG changes including lower voltages due to her significant obesity.   Follow-up with me annually or sooner as necessary.  [name], MD, [facility] [place] Director of the Advanced Lipid Disorders &  Cardiovascular Risk Reduction Clinic Diplomate of the American Board of Clinical Lipidology Attending Cardiologist  Direct Dial: [phone]  Fax: [phone]  Website:  [email or web address] 04/13/2018, 10:36 AM

## 2018-04-18 ENCOUNTER — Ambulatory Visit: Payer: Self-pay | Admitting: Dietician

## 2018-04-20 ENCOUNTER — Ambulatory Visit: Payer: Self-pay | Admitting: Obstetrics & Gynecology

## 2018-04-25 ENCOUNTER — Other Ambulatory Visit: Payer: Self-pay | Admitting: Student in an Organized Health Care Education/Training Program

## 2018-05-01 ENCOUNTER — Other Ambulatory Visit: Payer: Self-pay | Admitting: *Deleted

## 2018-05-01 ENCOUNTER — Other Ambulatory Visit: Payer: Self-pay

## 2018-05-01 MED ORDER — ROSUVASTATIN CALCIUM 20 MG PO TABS: 20.0000 mg | ORAL_TABLET | Freq: Every day | ORAL | 1 refills | Status: DC

## 2018-05-01 NOTE — Patient Outreach (Signed)
[facility]) Care Management  05/01/2018  [name] 11-08-1956 [id number]   Medication Adherence call to [name] spoke with patient she ask if [facility] can [facility] medication patient ask for a 90 days supply. [facility] for 90 days supply ,call doctor's office spoke with doctors nurse she said she will ask doctor to send in a prescription to [facility] for a 90 days supply. call patient back to let her know we have to wait for doctor's office to send it to the pharmacy.[facility]. Mrs. Dirusso is showing  Past due under [place].   [place] Management Direct Dial [phone]  Fax [phone] [email or web address]

## 2018-05-03 ENCOUNTER — Other Ambulatory Visit: Payer: Self-pay | Admitting: Obstetrics and Gynecology

## 2018-05-03 DIAGNOSIS — R350 Frequency of micturition: Secondary | ICD-10-CM

## 2018-05-03 MED ORDER — OXYBUTYNIN CHLORIDE ER 5 MG PO TB24: 5.0000 mg | ORAL_TABLET | Freq: Every day | ORAL | 0 refills | Status: DC

## 2018-05-03 NOTE — Progress Notes (Signed)
Refill sent for oxybutynin 5 mg daily.

## 2018-06-02 ENCOUNTER — Telehealth: Payer: Self-pay | Admitting: *Deleted

## 2018-06-02 MED ORDER — LOSARTAN POTASSIUM 100 MG PO TABS: 100.0000 mg | ORAL_TABLET | Freq: Every day | ORAL | 3 refills | Status: DC

## 2018-06-02 NOTE — Telephone Encounter (Signed)
[facility] - Irbesartan 300 mg is on backorder; requesting alternative med. Thanks

## 2018-06-02 NOTE — Telephone Encounter (Signed)
Ok. Will switch to Losartan 100mg  daily.

## 2018-06-05 ENCOUNTER — Other Ambulatory Visit: Payer: Self-pay

## 2018-06-05 NOTE — Patient Outreach (Signed)
[facility]) Care Management  06/05/2018  LATRECE NITTA 01/13/1956 [id number]   Medication Adherence call to [name] patient did not answer she is due on Irbesartan 300 mg,[facility] have [facility] ready for patient to pick up.Mrs. Dias is showing past due under [place].   [facility] Management Direct Dial [phone]  Fax [phone] [email or web address]

## 2018-06-23 ENCOUNTER — Other Ambulatory Visit: Payer: Self-pay | Admitting: *Deleted

## 2018-06-23 DIAGNOSIS — R350 Frequency of micturition: Secondary | ICD-10-CM

## 2018-06-23 NOTE — Telephone Encounter (Signed)
Received a fax for refill of Oxybutynin .  Last filled 05/03/18 per fax. Will forward to provider

## 2018-06-25 MED ORDER — OXYBUTYNIN CHLORIDE ER 5 MG PO TB24: 5.0000 mg | ORAL_TABLET | Freq: Every day | ORAL | 0 refills | Status: DC

## 2018-06-26 ENCOUNTER — Ambulatory Visit (INDEPENDENT_AMBULATORY_CARE_PROVIDER_SITE_OTHER): Payer: Medicare Other | Admitting: Student in an Organized Health Care Education/Training Program

## 2018-06-26 ENCOUNTER — Encounter (INDEPENDENT_AMBULATORY_CARE_PROVIDER_SITE_OTHER): Payer: Self-pay

## 2018-06-26 ENCOUNTER — Other Ambulatory Visit: Payer: Self-pay

## 2018-06-26 ENCOUNTER — Encounter: Payer: Self-pay | Admitting: Student in an Organized Health Care Education/Training Program

## 2018-06-26 VITALS — BP 159/96 | HR 50 | Wt 321.8 lb

## 2018-06-26 DIAGNOSIS — I2510 Atherosclerotic heart disease of native coronary artery without angina pectoris: Secondary | ICD-10-CM | POA: Diagnosis not present

## 2018-06-26 DIAGNOSIS — I10 Essential (primary) hypertension: Secondary | ICD-10-CM | POA: Diagnosis not present

## 2018-06-26 DIAGNOSIS — Z79899 Other long term (current) drug therapy: Secondary | ICD-10-CM

## 2018-06-26 DIAGNOSIS — E785 Hyperlipidemia, unspecified: Secondary | ICD-10-CM | POA: Diagnosis not present

## 2018-06-26 DIAGNOSIS — M1711 Unilateral primary osteoarthritis, right knee: Secondary | ICD-10-CM

## 2018-06-26 DIAGNOSIS — M25461 Effusion, right knee: Secondary | ICD-10-CM | POA: Diagnosis not present

## 2018-06-26 DIAGNOSIS — Z6379 Other stressful life events affecting family and household: Secondary | ICD-10-CM

## 2018-06-26 DIAGNOSIS — R011 Cardiac murmur, unspecified: Secondary | ICD-10-CM

## 2018-06-26 DIAGNOSIS — Z7982 Long term (current) use of aspirin: Secondary | ICD-10-CM

## 2018-06-26 DIAGNOSIS — Z96652 Presence of left artificial knee joint: Secondary | ICD-10-CM

## 2018-06-26 DIAGNOSIS — Z951 Presence of aortocoronary bypass graft: Secondary | ICD-10-CM

## 2018-06-26 NOTE — Assessment & Plan Note (Signed)
Blood pressure is above goal today.  Had a recent cardiology visit it was at goal.  She has been out of chlorthalidone for 3 days which I think is the culprit.  Plan is to restart chlorthalidone 25 mg daily, continue losartan 100 mg daily, metoprolol 25 mg twice daily.  Will check renal function today.

## 2018-06-26 NOTE — Assessment & Plan Note (Signed)
Stable atherosclerotic heart disease status post coronary artery bypass grafting, continue with aspirin 81 and atorvastatin 20 mg daily.  Check lipids today.  Will send results to her cardiologist.

## 2018-06-26 NOTE — Patient Instructions (Signed)
1.  Please restart your blood pressure medications as they are prescribed.  Let me know if you need any refills.  2. We did an injection into your right knee of steroids to try to help with your arthritis pain.  Please continue to walk and exercise, stay active, Use naproxen as needed for pain on bad days.

## 2018-06-26 NOTE — Progress Notes (Signed)
   Assessment and Plan:  See Encounters tab for problem-based medical decision making.   __________________________________________________________  HPI:   62 year old woman here for follow-up of right knee osteoarthritis and hypertension.  Symptomatically not doing very well.  Last arthrocentesis of the right knee was in April, over the last 1 month she has had return of symptoms.  Had good relief for about 2 months.  Now reporting more swelling of the right knee and pain with walking.  Says it is function limiting.  Rarely uses ibuprofen, usually about once per week.  Still trying to stay active.  Reports difficulty taking her antihypertensive medications, out of her water pill for 3 days.  Denies any chest pain or dyspnea with exertion.  Lots of social distress over the last 2 months.  She reports that her 5 grandchildren were recently taken into CPS custody away from their mother and father.  Apparently they were homeless for a period of time.  Her daughter just gave birth to another baby recently 2.  Children are now in foster care here in [place], but not [place].  Discussed a lot of distress for [place], since she was crying all the time.  Feeling really despondent.  Reports that she is unable to take any of the children in a retirement community and they will not allow folks under 12 years old.  However she says she was able to visit them recently, they have been through a lot but they are in a better situation now, so she is feeling somewhat hopeful in the future.  __________________________________________________________  Problem List: Patient Active Problem List   Diagnosis Date Noted  . Atherosclerosis of aorta ([place]) 06/20/2015    Priority: High  . Coronary atherosclerosis of native coronary artery 01/05/2014    Priority: High  . Osteoarthritis of right knee 11/07/2014    Priority: Medium  . Morbid obesity due to excess calories ([place]) 05/10/2012    Priority: Medium  .  Obstructive sleep apnea 03/27/2010    Priority: Medium  . Morbid obesity ([place]) 06/30/2007    Priority: Medium  . Essential hypertension 06/30/2007    Priority: Medium  . Osteoarthritis of left shoulder 10/24/2017    Priority: Low  . Peripheral neuropathy 08/19/2017    Priority: Low  . Health care maintenance 10/09/2014    Priority: Low  . GERD 08/06/2008    Priority: Low  . Hyperlipidemia 10/31/2007    Priority: Low    Medications: Reconciled today in Epic __________________________________________________________  Physical Exam:  Vital Signs: Vitals:   06/26/18 0849 06/26/18 0902  BP: (!) 165/79 (!) 159/96  Pulse: 60 (!) 50  SpO2: 97%   Weight: (!) 321 lb 12.8 oz (146 kg)     Gen: Well appearing, NAD uses a cane to walk,  CV: RRR, 2 out of 6 early systolic murmur at the right upper sternal border Pulm: Normal effort, CTA throughout, no wheezing Ext: Warm, no edema, left knee with total joint replacement that is well-healed without effusion.  Right knee has a large effusion mostly on the lateral side.  No joint laxity, no erythema or pain with passive range of motion.

## 2018-06-26 NOTE — Assessment & Plan Note (Signed)
Waxing and waning, currently more painful than usual.  She has a large effusion present, not interested in total knee arthroplasty as the left-sided knee replacement surgery was such a traumatic endeavor for her.  We do arthrocentesis with steroid injection every 3-4 months which provides her with several months of relief.  We are able to drain about 40 mL's of fluid off the right knee today and injected with 40 mg of triamcinolone.   Knee Arthrocentesis with Injection Procedure Note  Diagnosis: right knee osteoarthritis  Indications: Symptomatic relief of large effusion  Anesthesia: Lidocaine 1% without epinephrine  Procedure Details   Point of care ultrasound was used to identify the joint effusion and plan needle trajectory and depth. Consent was obtained for the procedure. The joint was prepped with Betadine. An 18 gauge needle was inserted into the superior aspect of the joint from a lateral approach to access the suprapatellar pouch. 40 ml of clear yellow fluid was removed from the joint and discarded. 2 ml 1% lidocaine and 40 mg of Triamcinolone was then injected into the joint through the same needle. The needle was removed and the area cleansed and dressed.  Complications:  None; patient tolerated the procedure well.   Long axis view of the right lateral knee showing a large effusion in the suprapatellar bursa.

## 2018-06-27 ENCOUNTER — Encounter: Payer: Self-pay | Admitting: Student in an Organized Health Care Education/Training Program

## 2018-06-27 LAB — LIPID PANEL
Chol/HDL Ratio: 2.8 ratio (ref 0.0–4.4)
Cholesterol, Total: 131 mg/dL (ref 100–199)
HDL: 46 mg/dL (ref >39)
LDL Calculated: 68 mg/dL (ref 0–99)
TRIGLYCERIDES: 86 mg/dL (ref 0–149)
VLDL Cholesterol Cal: 17 mg/dL (ref 5–40)

## 2018-06-27 LAB — BMP8+ANION GAP
ANION GAP: 15 mmol/L (ref 10.0–18.0)
BUN/Creatinine Ratio: 11 — ABNORMAL LOW (ref 12–28)
BUN: 9 mg/dL (ref 8–27)
CO2: 25 mmol/L (ref 20–29)
Calcium: 8.9 mg/dL (ref 8.7–10.3)
Chloride: 101 mmol/L (ref 96–106)
Creatinine, Ser: 0.8 mg/dL (ref 0.57–1.00)
GFR calc Af Amer: 90 mL/min/{1.73_m2} (ref >59)
GFR calc non Af Amer: 78 mL/min/{1.73_m2} (ref >59)
GLUCOSE: 111 mg/dL — AB (ref 65–99)
Potassium: 4 mmol/L (ref 3.5–5.2)
SODIUM: 141 mmol/L (ref 134–144)

## 2018-06-28 ENCOUNTER — Ambulatory Visit: Payer: Self-pay | Admitting: Obstetrics & Gynecology

## 2018-07-20 ENCOUNTER — Telehealth: Payer: Self-pay | Admitting: Student in an Organized Health Care Education/Training Program

## 2018-08-07 ENCOUNTER — Encounter: Payer: Self-pay | Admitting: Obstetrics & Gynecology

## 2018-08-07 ENCOUNTER — Ambulatory Visit (INDEPENDENT_AMBULATORY_CARE_PROVIDER_SITE_OTHER): Payer: Medicare Other | Admitting: Obstetrics & Gynecology

## 2018-08-07 VITALS — BP 130/71 | HR 74 | Wt 318.6 lb

## 2018-08-07 DIAGNOSIS — R350 Frequency of micturition: Secondary | ICD-10-CM

## 2018-08-07 NOTE — Progress Notes (Signed)
History:  62 y.o. [id number] here today for eval of urinary urge sx. Pt denies leakage of urine. If she drinks caffeine there may be leakage.   Pt stopped the Detrol last Thursday. She was given a diuretic by her primary care provider which she has stopped taking. She reports that she is using another natural diuretic that works better. She reports that she thinks that if she just stopped drinking excess water she thinks her sx would resolve.     The following portions of the patient's history were reviewed and updated as appropriate: allergies, current medications, past family history, past medical history, past social history, past surgical history and problem list.  Review of Systems:  Pertinent items are noted in HPI.    Objective:  Physical Exam Blood pressure 130/71, pulse 74, weight (!) 318 lb 9.6 oz (144.5 kg), last menstrual period 01/13/2014.  CONSTITUTIONAL: Well-developed, well-nourished female in no acute distress.  HENT:  Normocephalic, atraumatic EYES: Conjunctivae and EOM are normal. No scleral icterus.  NECK: Normal range of motion SKIN: Skin is warm and dry. No rash noted. Not diaphoretic.No pallor. [place]: Alert and oriented to person, place, and time. Normal coordination.     Assessment & Plan:  Urinary freq-  Pt wants to try conservative measures at present Obesity  Discussed with pt  Total face-to-face time with patient was 20 min.  Greater than 50% was spent in counseling and coordination of care with the patient.   Linn Goetze L. Harraway-Smith, M.D., [facility]

## 2018-08-21 ENCOUNTER — Other Ambulatory Visit: Payer: Self-pay | Admitting: *Deleted

## 2018-08-21 NOTE — Telephone Encounter (Signed)
[facility] for [facility] 90 day refill of oxybutynin ER 5 mg tablets. Will forward to provider.

## 2018-08-22 MED ORDER — OXYBUTYNIN CHLORIDE ER 5 MG PO TB24: 5.0000 mg | ORAL_TABLET | Freq: Every day | ORAL | 0 refills | Status: DC

## 2018-09-01 ENCOUNTER — Other Ambulatory Visit: Payer: Self-pay | Admitting: Internal Medicine

## 2018-10-16 ENCOUNTER — Ambulatory Visit (INDEPENDENT_AMBULATORY_CARE_PROVIDER_SITE_OTHER): Payer: Medicare Other | Admitting: Student in an Organized Health Care Education/Training Program

## 2018-10-16 ENCOUNTER — Encounter (INDEPENDENT_AMBULATORY_CARE_PROVIDER_SITE_OTHER): Payer: Self-pay

## 2018-10-16 ENCOUNTER — Encounter: Payer: Self-pay | Admitting: Student in an Organized Health Care Education/Training Program

## 2018-10-16 VITALS — BP 162/73 | HR 70 | Temp 98.4°F | Wt 323.9 lb

## 2018-10-16 DIAGNOSIS — F321 Major depressive disorder, single episode, moderate: Secondary | ICD-10-CM

## 2018-10-16 DIAGNOSIS — I10 Essential (primary) hypertension: Secondary | ICD-10-CM | POA: Diagnosis not present

## 2018-10-16 DIAGNOSIS — F339 Major depressive disorder, recurrent, unspecified: Secondary | ICD-10-CM

## 2018-10-16 DIAGNOSIS — Z6841 Body Mass Index (BMI) 40.0 and over, adult: Secondary | ICD-10-CM

## 2018-10-16 DIAGNOSIS — F329 Major depressive disorder, single episode, unspecified: Secondary | ICD-10-CM | POA: Insufficient documentation

## 2018-10-16 DIAGNOSIS — Z23 Encounter for immunization: Secondary | ICD-10-CM | POA: Diagnosis not present

## 2018-10-16 DIAGNOSIS — Z79899 Other long term (current) drug therapy: Secondary | ICD-10-CM

## 2018-10-16 DIAGNOSIS — M1711 Unilateral primary osteoarthritis, right knee: Secondary | ICD-10-CM | POA: Diagnosis not present

## 2018-10-16 DIAGNOSIS — Z638 Other specified problems related to primary support group: Secondary | ICD-10-CM

## 2018-10-16 DIAGNOSIS — F32A Depression, unspecified: Secondary | ICD-10-CM | POA: Insufficient documentation

## 2018-10-16 DIAGNOSIS — Z96651 Presence of right artificial knee joint: Secondary | ICD-10-CM

## 2018-10-16 LAB — SYNOVIAL CELL COUNT + DIFF, W/ CRYSTALS
Crystals, Fluid: NONE SEEN
Eosinophils-Synovial: 0 % (ref 0–1)
LYMPHOCYTES-SYNOVIAL FLD: 26 % — AB (ref 0–20)
Monocyte-Macrophage-Synovial Fluid: 58 % (ref 50–90)
Neutrophil, Synovial: 16 % (ref 0–25)
WBC, Synovial: 355 /mm3 — ABNORMAL HIGH (ref 0–200)

## 2018-10-16 MED ORDER — CHLORTHALIDONE 50 MG PO TABS: 50.0000 mg | ORAL_TABLET | Freq: Every day | ORAL | 3 refills | Status: DC

## 2018-10-16 MED ORDER — DULOXETINE HCL 30 MG PO CPEP: 30.0000 mg | ORAL_CAPSULE | Freq: Every day | ORAL | 2 refills | Status: DC

## 2018-10-16 NOTE — Progress Notes (Signed)
   Assessment and Plan:  See Encounters tab for problem-based medical decision making.   __________________________________________________________  HPI:   62 year old woman here for follow-up of right knee pain.  She reports that she had good benefit from the last arthrocentesis with steroid injection.  Lasted her several months.  Pain is returned and swelling is returned.  Now can be function limiting.  Also describing diffuse pain in all of her muscles and joints.  Says it goes from her head down through her knees.  Feels fatigued.  Denies any fevers or chills.  No recent illnesses.  No chest pain or shortness of breath.  No changes in her medications.  Uses anti-inflammatories on occasion with some benefit.  Continues to struggle with depression and very difficult social situation.  Her grandkids have been in foster care for several months now.  She visits with them frequently and says that they are doing well.  She drives her 64 year old granddaughter to school every day and picks her up from school.  Her son is struggling with drug use.  I get the sense that she wishes she could do more for all of these family members, but she is limited by her own social situation resources, and chronic illness.  This is a lot of stress for her, she says she cries frequently.  We talked about how this kind of stress and depression can affect the quality and she agrees.  Says that most days she stays in bed when her mood gets this depressed.  __________________________________________________________  Problem List: Patient Active Problem List   Diagnosis Date Noted  . Depression 10/16/2018    Priority: High  . Atherosclerosis of aorta ([place]) 06/20/2015    Priority: High  . Coronary atherosclerosis of native coronary artery 01/05/2014    Priority: High  . Osteoarthritis of right knee 11/07/2014    Priority: Medium  . Morbid obesity due to excess calories ([place]) 05/10/2012    Priority: Medium  .  Obstructive sleep apnea 03/27/2010    Priority: Medium  . Essential hypertension 06/30/2007    Priority: Medium  . Osteoarthritis of left shoulder 10/24/2017    Priority: Low  . Peripheral neuropathy 08/19/2017    Priority: Low  . Health care maintenance 10/09/2014    Priority: Low  . GERD 08/06/2008    Priority: Low  . Hyperlipidemia 10/31/2007    Priority: Low    Medications: Reconciled today in Epic __________________________________________________________  Physical Exam:  Vital Signs: Vitals:   10/16/18 0930  BP: (!) 162/73  Pulse: 70  Temp: 98.4 F (36.9 C)  TempSrc: Oral  SpO2: 99%  Weight: (!) 323 lb 14.4 oz (146.9 kg)    Gen: Well appearing, NAD Neck: No cervical LAD, No thyromegaly or nodules CV: RRR, no murmurs Pulm: Normal effort, CTA throughout, no wheezing Ext: Warm, right knee with a large effusion, no redness

## 2018-10-16 NOTE — Patient Instructions (Signed)
Today we talked to him pain in your right knee, left shoulder, and the muscles and bones anywhere else.  Decided to start a medication called Cymbalta, this is an excellent medication for folks with depressed mood as well as for arthritis.,  Check some blood work today to make sure there is no inflammation or damage in your muscles  We drained and injected your right knee.  Call me if you have any problems like increased pain, fevers, or redness of the skin.

## 2018-10-16 NOTE — Assessment & Plan Note (Signed)
Blood pressure elevated with systolic 458.  This is chronic and asymptomatic.  Plan is to increase chlorthalidone to 50 mg daily, continue losartan 100 mg daily, metoprolol 25 mg 2 times daily.

## 2018-10-16 NOTE — Assessment & Plan Note (Signed)
Any significant life stressors related to family issues.  PHQ 9 score of 9 today, however I think this underestimates the degree her depression is impacting her life.  She is having significant somatic symptoms with body aches.  She has morbid obesity, and advanced arthritis in her knee, shoulder, back, all this is leading to poor functional state.  Plan is to start duloxetine 30 mg once daily.  Follow-up in 3 months.  May benefit from counseling

## 2018-10-16 NOTE — Assessment & Plan Note (Addendum)
Worsening pain over the last few weeks.  Had good benefit from last arthrocentesis over 3 months ago.  No fevers or chills.  Pain can be function limiting for her.  Continues to not interested in right-sided joint replacement because she had a lot of difficulty with the left knee replacement.  Plan is to continue with arthrocentesis to relieve symptomatic large effusion as well as steroid injections.   Knee Arthrocentesis with Injection Procedure Note  Diagnosis: right knee osteoarthritis  Indications: Symptomatic relief of large effusion  Anesthesia: Lidocaine 1% without epinephrine  Procedure Details   Point of care ultrasound was used to identify the joint effusion and plan needle trajectory and depth. Consent was obtained for the procedure. The joint was prepped with Betadine. A 22 gauge needle was inserted into the superior aspect of the joint from a lateral approach to access the suprapatellar pouch. 70 ml of clear yellow fluid was removed from the joint and sent to the lab for analysis. 2 ml 1% lidocaine and 40 mg of Triamcinolone was then injected into the joint through the same needle. The needle was removed and the area cleansed and dressed.  Complications:  None; patient tolerated the procedure well.

## 2018-10-17 ENCOUNTER — Encounter: Payer: Self-pay | Admitting: Student in an Organized Health Care Education/Training Program

## 2018-10-17 LAB — CBC
HEMATOCRIT: 39.5 % (ref 34.0–46.6)
Hemoglobin: 13.3 g/dL (ref 11.1–15.9)
MCH: 28 pg (ref 26.6–33.0)
MCHC: 33.7 g/dL (ref 31.5–35.7)
MCV: 83 fL (ref 79–97)
PLATELETS: 227 10*3/uL (ref 150–450)
RBC: 4.75 x10E6/uL (ref 3.77–5.28)
RDW: 14.6 % (ref 12.3–15.4)
WBC: 6.4 10*3/uL (ref 3.4–10.8)

## 2018-10-17 LAB — HEPATIC FUNCTION PANEL
ALBUMIN: 4.1 g/dL (ref 3.6–4.8)
ALT: 10 IU/L (ref 0–32)
AST: 16 IU/L (ref 0–40)
Alkaline Phosphatase: 46 IU/L (ref 39–117)
Bilirubin Total: 0.5 mg/dL (ref 0.0–1.2)
Bilirubin, Direct: 0.14 mg/dL (ref 0.00–0.40)
Total Protein: 7.2 g/dL (ref 6.0–8.5)

## 2018-10-17 LAB — CK: Total CK: 148 U/L (ref 24–173)

## 2018-11-08 ENCOUNTER — Other Ambulatory Visit: Payer: Self-pay | Admitting: Student in an Organized Health Care Education/Training Program

## 2018-11-29 ENCOUNTER — Other Ambulatory Visit: Payer: Self-pay | Admitting: Student in an Organized Health Care Education/Training Program

## 2018-12-14 ENCOUNTER — Encounter: Payer: Self-pay | Admitting: *Deleted

## 2018-12-19 ENCOUNTER — Other Ambulatory Visit: Payer: Self-pay

## 2018-12-19 NOTE — Patient Outreach (Signed)
[facility]) Care Management  12/19/2018  [name] Apr 18, 1956 [id number]   Medication Adherence call to [name] patient did not answer patient is due on Rosuvastatin 20 mg and Losartan 100 mg. [facility] said patient last pick up was on 08/29/18 for a 90 days supply. [name] is showing past due under [facility] ins.   [place] Management Direct Dial [phone]  Fax [phone] [email or web address]

## 2018-12-22 ENCOUNTER — Other Ambulatory Visit: Payer: Self-pay

## 2018-12-22 ENCOUNTER — Encounter: Payer: Self-pay | Admitting: Internal Medicine

## 2018-12-22 ENCOUNTER — Ambulatory Visit (INDEPENDENT_AMBULATORY_CARE_PROVIDER_SITE_OTHER): Payer: Medicare Other | Admitting: Internal Medicine

## 2018-12-22 VITALS — BP 154/76 | HR 76 | Temp 98.7°F | Ht 68.0 in | Wt 321.3 lb

## 2018-12-22 DIAGNOSIS — Z79899 Other long term (current) drug therapy: Secondary | ICD-10-CM | POA: Diagnosis not present

## 2018-12-22 DIAGNOSIS — J069 Acute upper respiratory infection, unspecified: Secondary | ICD-10-CM

## 2018-12-22 DIAGNOSIS — Z8709 Personal history of other diseases of the respiratory system: Secondary | ICD-10-CM | POA: Diagnosis not present

## 2018-12-22 MED ORDER — ALBUTEROL SULFATE HFA 108 (90 BASE) MCG/ACT IN AERS: 2.0000 | INHALATION_SPRAY | Freq: Four times a day (QID) | RESPIRATORY_TRACT | 0 refills | Status: DC | PRN

## 2018-12-22 NOTE — Patient Instructions (Signed)
FOLLOW-UP INSTRUCTIONS When: If your symptoms worsen or fail to improve What to bring: All of your medications  Today we discussed your sinus congestion, runny nose, and 4-6 days of feeling tired. Based on what I have seen today and what you have informed me of, I feel you will improve nicely with rest and symptoms treatment. Continue the mucinex at no more than the recommended dosage on the box. You may also use Tylenol 500mg  up to three times per day for discomfort. Please watch out for and notify us of a high fever, symptoms that improve and then severely worsen or coughing up blood or heavy mucus with shortness of breath.  Thank you for your visit to the [facility] today. If you have any questions or concerns please call us at [phone].

## 2018-12-22 NOTE — Assessment & Plan Note (Signed)
  URI: Patient presents today for an approximately 5-day history of sinus congestion mild generalized fatigue, mild unproductive cough, and the sensation of drainage in her throat.  She denies fever, chills, myalgias, visual changes, hearing loss or ear involvement, chest pain, palpitations, nausea, vomiting, abdominal pain.  She denies sinus tenderness as well.  She states that she has been self treating with approximately 2 gallons of water daily, large amounts of lemons, ginger, and orange peel.  In addition she has utilized Mucinex as needed.  She stated that when she awoke this morning she felt significantly better.   Plan: This appears to be a mild viral URI. The patient was given strict return precautions regarding sudden worsening of her symptoms, high fever, shortness of breath. I recommended Tylenol, daily Mucinex as per manufacturer recommendations, and that she decrease her intake of fluids to less than 3/4-1 gallon maximum given her history and mildly increased peripheral edema.

## 2018-12-22 NOTE — Progress Notes (Signed)
   CC: nasal congestion   HPI:Ms.KYANA AICHER is a 63 y.o. female who presents for evaluation of nasal congestion. Please see individual problem based A/P for details.  PHQ-9: Based on the patients    Office Visit from 12/22/2018 in [place]  PHQ-9 Total Score  4     score we have decided to continue monitoring .  Past Medical History:  Diagnosis Date  . Acute urinary retention s/p Foley 01/07/2012  . CAD (coronary artery disease)    a. 12/2013 s/p CABG x 5 Dr. Darcey Nora (LIMA to LAD, SVG to diagonal, SVG to OM1, SVG to OM2, SVG to PDA)  . Carotid arterial disease ([place])    a. 12/2013 Carotid U/S: 1-39% bilat ICA stenosis.  . Carpal tunnel syndrome, bilateral   . Hemorrhoids, internal, with bleeding & prolapse 12/13/2011  . Hernia, abdominal   . History of blood transfusion    CABG and hysterectomy  . History of kidney stones   . Hypertension   . Iron deficiency 12-07-2011  . Keloid scar    a. Sternal Keloid s/p CABG with ongoing pain.  . Morbid obesity ([place])   . Myocardial infarct ([place])   . Neuropathy   . Osteoarthritis   . Osteoporosis   . Seasonal allergies   . Sleep apnea    no CPAP machine; sleep study 02/2010 REM AHI 61.7/hr, total sleep REM 14.8/hr  . Uterine cancer ([place]) 7/15   clinical stage IA grade 1 endometrioid endometrial cancer   Review of Systems:  ROS negative except as per HPI.  Physical Exam: Vitals:   12/22/18 1057  BP: (!) 154/76  Pulse: 76  Temp: 98.7 F (37.1 C)  TempSrc: Oral  SpO2: 99%  Weight: (!) 321 lb 4.8 oz (145.7 kg)  Height: 5\' 8"  (1.727 m)   General: A/O x4, in no acute distress, afebrile, nondiaphoretic HEENT: PEERL, no sinus tenderness to palpation, nares are nonedematous, nonerythematous with clear discharge present. Cardio: RRR, no mrg's Pulmonary: CTA bilaterally MSK: BLE nontender, mildly edematous   Assessment & Plan:   See Encounters Tab for problem based charting.  Patient discussed with Dr.  Dareen Piano

## 2018-12-22 NOTE — Assessment & Plan Note (Addendum)
Patient denied dyspnea but requested a refill of her PRN albuterol inhaler as her last inhaler has expired.

## 2018-12-25 NOTE — Progress Notes (Signed)
Internal Medicine Clinic Attending  Case discussed with Dr. Harbrecht at the time of the visit.  We reviewed the resident's history and exam and pertinent patient test results.  I agree with the assessment, diagnosis, and plan of care documented in the resident's note.   

## 2019-02-05 ENCOUNTER — Telehealth: Payer: Self-pay | Admitting: Student in an Organized Health Care Education/Training Program

## 2019-02-05 ENCOUNTER — Ambulatory Visit: Payer: Self-pay | Admitting: Student in an Organized Health Care Education/Training Program

## 2019-02-05 DIAGNOSIS — M1711 Unilateral primary osteoarthritis, right knee: Secondary | ICD-10-CM

## 2019-02-05 MED ORDER — DICLOFENAC SODIUM 1 % TD GEL: 2.0000 g | Freq: Four times a day (QID) | TRANSDERMAL | 1 refills | Status: DC

## 2019-02-05 NOTE — Telephone Encounter (Signed)
Had to cancel the patient's annual wellness visit this morning, rescheduled for a later date, due to the COVID-19 pandemic.  Patient is doing well with no respiratory symptoms.  Only issue now is some increasing right knee pain due to osteoarthritis.  I am putting elective steroid injections on hold for now to avoid any immunosuppression.  I prescribed her Voltaren gel.  She is up-to-date on all her healthcare maintenance interventions.  I advised that she follows up with me in about 3 months when the restrictions are hopefully lifted.  Call me if she has any difficulties and I will try to manage them over the phone.

## 2019-03-14 ENCOUNTER — Telehealth: Payer: Self-pay | Admitting: Student in an Organized Health Care Education/Training Program

## 2019-03-14 NOTE — Telephone Encounter (Signed)
Pt calls and states she awoke with a cough this am, it is not a "sick" cough, no fever, no aches/pains, no n&v, after she sat up for awhile and had warm tea it stopped. She states she is sure it is r/t her BP meds.Marland Kitchen  appt 4/22 TELEHEALTH AT [id number]

## 2019-03-14 NOTE — Telephone Encounter (Signed)
Noted. Minor, self limited symptoms. Probably does not need telehealth visit, just reassurance.

## 2019-03-15 ENCOUNTER — Ambulatory Visit (INDEPENDENT_AMBULATORY_CARE_PROVIDER_SITE_OTHER): Payer: Medicare Other | Admitting: Internal Medicine

## 2019-03-15 ENCOUNTER — Other Ambulatory Visit: Payer: Self-pay

## 2019-03-15 DIAGNOSIS — R058 Other specified cough: Secondary | ICD-10-CM

## 2019-03-15 DIAGNOSIS — R05 Cough: Secondary | ICD-10-CM

## 2019-03-15 DIAGNOSIS — R059 Cough, unspecified: Secondary | ICD-10-CM | POA: Insufficient documentation

## 2019-03-15 MED ORDER — FLUTICASONE PROPIONATE 50 MCG/ACT NA SUSP: 1.0000 | Freq: Every day | NASAL | 2 refills | Status: DC

## 2019-03-15 NOTE — Progress Notes (Signed)
   [facility] Telephone Encounter  Reason for call:   This telephone encounter was created for [name] on 03/15/2019 for the following purpose/cc occasional cough.   Pertinent Data:   Hx GERD, HTN not on ACEI, OSA ROS: Pulmonary: pt denies increased work of breathing, shortness of breath,  Cardiac: pt denies palpitations, chest pain,   Abdominal: pt denies abdominal pain, nausea, vomiting, or diarrhea   Assessment / Plan / Recommendations:   Occasional Cough: periodically coughs mainly at night before bed.  Does have some post nasal drip and congestion.  When she goes outside she notices some itching of the eyes and congestion and sinus drainage seems to worsen when she's outside.  Feels very good today.  Temp at home 97.4.  Cough improved with honey lemon and ginger tea. Over the counter loratadine seems to help but she hasn't been taking it.  Also has mucinex which helps when she needs it.  Did have an episode overnight of what sounds like apnea that awakened her from sleep.  She says she did have a test that was borderline for sleep apnea but later on chart review it appears she has been diagnosed formally but no titration.  Slept well last night with better sleep hygiene, no TV.  Says her knees hurt hasn't been able to get steroid injection due to covid.  Does have albuterol inhaler for allergy induced asthma but doesn't use it feels she does not need it.  "Dr. Evette Doffing said last year I have seasonal allergies".    P: No changes to bp meds  Prescribe flonase, she can continue otc loratadine and mucinex  cpap titration study when they become available again.    As always, pt is advised that if symptoms worsen or new symptoms arise, they should go to an urgent care facility or to to ER for further evaluation.   Consent and Medical Decision Making:   Patient discussed with Dr. Dareen Piano  This is a telephone encounter between [name] and  [name] on 03/15/2019 for Occasional Cough. The visit was conducted with the patient located at home and [name] at [facility]. The patient's identity was confirmed using their DOB and current address. The patient has consented to being evaluated through a telephone encounter and understands the associated risks (an examination cannot be done and the patient may need to come in for an appointment) / benefits (allows the patient to remain at home, decreasing exposure to coronavirus). I personally spent 18 minutes on medical discussion.

## 2019-03-15 NOTE — Assessment & Plan Note (Signed)
Cough: periodically coughs mainly at night before bed.  Does have some post nasal drip and congestion.  When she goes outside she notices some itching of the eyes and congestion and sinus drainage seems to worsen when she's outside.  Feels very good today.  Temp at home 97.4.  Cough improved with honey lemon and ginger tea. Over the counter loratadine seems to help but she hasn't been taking it.  Also has mucinex which helps when she needs it.  Did have an episode overnight of what sounds like apnea that awakened her from sleep.  She says she did have a test that was borderline for sleep apnea but later on chart review it appears she has been diagnosed formally but no titration.  Slept well last night with better sleep hygiene, no TV.  Says her knees hurt hasn't been able to get steroid injection due to covid.  Does have albuterol inhaler for allergy induced asthma but doesn't use it feels she does not need it.  "Dr. Evette Doffing said last year I have seasonal allergies".    P: No changes to bp meds Prescribe flonase, she can continue otc loratadine and mucinex cpap titration study when they become available again.   As always, pt is advised that if symptoms worsen or new symptoms arise, they should go to an urgent care facility or to to ER for further evaluation.

## 2019-03-16 NOTE — Progress Notes (Signed)
Internal Medicine Clinic Attending  Case discussed with Dr. Winfrey  at the time of the visit.  We reviewed the resident's history and exam and pertinent patient test results.  I agree with the assessment, diagnosis, and plan of care documented in the resident's note.  

## 2019-03-30 ENCOUNTER — Telehealth: Payer: Self-pay | Admitting: Internal Medicine

## 2019-03-30 NOTE — Telephone Encounter (Signed)
Smartphone/ my chart via email/ consent/ pre reg completed °

## 2019-04-02 ENCOUNTER — Telehealth (INDEPENDENT_AMBULATORY_CARE_PROVIDER_SITE_OTHER): Payer: Medicare Other | Admitting: Internal Medicine

## 2019-04-02 ENCOUNTER — Encounter: Payer: Self-pay | Admitting: Internal Medicine

## 2019-04-02 VITALS — BP 138/68 | Ht 68.0 in | Wt 300.0 lb

## 2019-04-02 DIAGNOSIS — Z7189 Other specified counseling: Secondary | ICD-10-CM

## 2019-04-02 DIAGNOSIS — E785 Hyperlipidemia, unspecified: Secondary | ICD-10-CM | POA: Diagnosis not present

## 2019-04-02 DIAGNOSIS — I10 Essential (primary) hypertension: Secondary | ICD-10-CM

## 2019-04-02 DIAGNOSIS — Z951 Presence of aortocoronary bypass graft: Secondary | ICD-10-CM

## 2019-04-02 DIAGNOSIS — I2510 Atherosclerotic heart disease of native coronary artery without angina pectoris: Secondary | ICD-10-CM

## 2019-04-02 LAB — LIPID PANEL
Chol/HDL Ratio: 2.7 ratio (ref 0.0–4.4)
Cholesterol, Total: 127 mg/dL (ref 100–199)
HDL: 47 mg/dL (ref >39)
LDL Calculated: 60 mg/dL (ref 0–99)
Triglycerides: 99 mg/dL (ref 0–149)
VLDL Cholesterol Cal: 20 mg/dL (ref 5–40)

## 2019-04-02 NOTE — Progress Notes (Signed)
Virtual Visit via Video Note   This visit type was conducted due to national recommendations for restrictions regarding the COVID-19 Pandemic (e.g. social distancing) in an effort to limit this patient's exposure and mitigate transmission in our community.  Due to her co-morbid illnesses, this patient is at least at moderate risk for complications without adequate follow up.  This format is felt to be most appropriate for this patient at this time.  All issues noted in this document were discussed and addressed.  A limited physical exam was performed with this format.  Please refer to the patient's chart for her consent to telehealth for [name].   Evaluation Performed:  Doximity video visit  Date:  04/02/2019   ID:  [name], DOB 08/26/1956, MRN [id number]  Patient Location:  [address] [place] [address]  Provider location:   [address] [address] [place], [place] [address]  PCP:  Axel Filler, MD  Cardiologist:  No primary care provider on file. Electrophysiologist:  None   Chief Complaint:  No complaints  History of Present Illness:    [name] is a 63 y.o. female who presents via audio/video conferencing for a telehealth visit today.  Mrs. Gibeau was seen today for video follow-up.  She was somewhat confused and presented to the office but did a video follow-up from her car.  Overall she is feeling well.  She attended her daughter's wedding this past weekend.  There are about 45 people wearing masks and socially distance about 6 feet apart.  She denies any chest pain or shortness of breath.  She has lost about 15 pounds and says she is feeling better.  I suspect this is helped her numbers overall.  She did have one episode where she woke up short of breath the middle the night.  There is concern from her PCP that this could be sleep apnea although her study in 2018 was mild and she is not on therapy.  She is also lost weight so suspect  that that is improved.  She is now 5 years past bypass surgery.  She reports very infrequent chest wall tenderness mainly at her keloid scar but has had some improvement after bilateral breast reduction 3 years ago.  She is overdue for lipid profile however LDL was controlled with less than 70.  Her last stress test was in 2018 which was nonischemic.  The patient does not have symptoms concerning for COVID-19 infection (fever, chills, cough, or new SHORTNESS OF BREATH).    Prior CV studies:   The following studies were reviewed today:  Chart review Lipid profile  PMHx:  Past Medical History:  Diagnosis Date  . Acute urinary retention s/p Foley 01/07/2012  . CAD (coronary artery disease)    a. 12/2013 s/p CABG x 5 Dr. Darcey Nora (LIMA to LAD, SVG to diagonal, SVG to OM1, SVG to OM2, SVG to PDA)  . Carotid arterial disease ([place])    a. 12/2013 Carotid U/S: 1-39% bilat ICA stenosis.  . Carpal tunnel syndrome, bilateral   . Hemorrhoids, internal, with bleeding & prolapse 12/13/2011  . Hernia, abdominal   . History of blood transfusion    CABG and hysterectomy  . History of kidney stones   . Hypertension   . Iron deficiency 12-07-2011  . Keloid scar    a. Sternal Keloid s/p CABG with ongoing pain.  . Morbid obesity ([place])   . Myocardial infarct ([place])   . Neuropathy   .  Osteoarthritis   . Osteoporosis   . Seasonal allergies   . Sleep apnea    no CPAP machine; sleep study 02/2010 REM AHI 61.7/hr, total sleep REM 14.8/hr  . Uterine cancer ([place]) 7/15   clinical stage IA grade 1 endometrioid endometrial cancer    Past Surgical History:  Procedure Laterality Date  . ABDOMINAL HYSTERECTOMY    . BREAST REDUCTION SURGERY Bilateral 10/10/2015   Procedure: BILATERAL MAMMARY REDUCTION  (BREAST) WITH FREE NIPPLE GRAFT;  Surgeon: Irene Limbo, MD;  Location: [facility];  Service: Plastics;  Laterality: Bilateral;  . BREAST SURGERY     reduction  . CARDIAC CATHETERIZATION    . COLONOSCOPY  2009  .  COLONOSCOPY W/ BIOPSIES AND POLYPECTOMY    . CORONARY ARTERY BYPASS GRAFT N/A 01/09/2014   Procedure: CORONARY ARTERY BYPASS GRAFTING (CABG) x 5 using left internal mammary artery and right leg greater saphenous vein harvested endoscopically;  Surgeon: Ivin Poot, MD;  Location: [facility];  Service: Open Heart Surgery;  Laterality: N/A;  please use bed extenders and breast binder  . INTRAOPERATIVE TRANSESOPHAGEAL ECHOCARDIOGRAM N/A 01/09/2014   Procedure: INTRAOPERATIVE TRANSESOPHAGEAL ECHOCARDIOGRAM;  Surgeon: Ivin Poot, MD;  Location: [facility];  Service: Open Heart Surgery;  Laterality: N/A;  . KNEE ARTHROSCOPY  1991  . KNEE SURGERY Bilateral 1999  . LEFT HEART CATHETERIZATION WITH CORONARY ANGIOGRAM N/A 01/04/2014   Procedure: LEFT HEART CATHETERIZATION WITH CORONARY ANGIOGRAM;  Surgeon: Sinclair Grooms, MD;  Location: [facility] C[facility];  Service: Cardiovascular;  Laterality: N/A;  . LESION EXCISION WITH COMPLEX REPAIR N/A 05/07/2016   Procedure: COMPLEX REPAIR OF CHEST 16 CM;  Surgeon: Irene Limbo, MD;  Location: [facility];  Service: Plastics;  Laterality: N/A;  . MULTIPLE EXTRACTIONS WITH ALVEOLOPLASTY N/A 04/11/2014   Procedure: Extraction of tooth #'s 1,2,8,16 with alveoloplasty, maxillary tuberosity reductions, and gross debridement of remaining teeth.;  Surgeon: Lenn Cal, DDS;  Location: [facility];  Service: Oral Surgery;  Laterality: N/A;  . MULTIPLE TOOTH EXTRACTIONS    . NM MYOCAR PERF WALL MOTION  01/2010   dipyridamole myoview - moderate perfusion defect in basal inferoseptal, basal inferior, mid inferoseptal, mid inferior, apical inferior region; EF 56%  . TEE WITHOUT CARDIOVERSION  01/2010   EF 60-65%, small, flat, non-infiltrating, calcified, fixed apical/septal mass  . TOTAL KNEE ARTHROPLASTY Left 04/29/2011  . transanal hemorrhoidal dearterliaization  01/06/12   with external hemorrhoid removal  . UNILATERAL SALPINGECTOMY Right 06/03/2014   Procedure: UNILATERAL SALPINGECTOMY;   Surgeon: Lavonia Drafts, MD;  Location: [facility];  Service: Gynecology;  Laterality: Right;  Marland Kitchen VAGINAL HYSTERECTOMY N/A 06/03/2014   Procedure: HYSTERECTOMY VAGINAL;  Surgeon: Lavonia Drafts, MD;  Location: [facility];  Service: Gynecology;  Laterality: N/A;  . WISDOM TOOTH EXTRACTION      FAMHx:  Family History  Problem Relation Age of Onset  . Heart attack Mother 75  . Heart disease Mother   . Hypertension Mother   . Diabetes Mother   . Hypertension Sister   . Heart failure Sister   . Heart disease Sister   . Diabetes Sister   . Kidney disease Brother        hypertension  . Hypertension Daughter   . Colon cancer Neg Hx     SOCHx:   reports that she has never smoked. She has never used smokeless tobacco. She reports that she does not drink alcohol or use drugs.  ALLERGIES:  Allergies  Allergen Reactions  . Cephalosporins Anaphylaxis, Swelling  and Other (See Comments)    Tongue swelling, gum pain  . Eicosapentaenoic Acid (Epa) Anaphylaxis and Shortness Of Breath  . Fish-Derived Products Anaphylaxis and Shortness Of Breath  . Peanuts [Peanut Oil] Anaphylaxis, Shortness Of Breath and Other (See Comments)    Peanut butter  . Shrimp [Shellfish Allergy] Anaphylaxis    Pt states her throat will swell if she eats shrimp.  . Latex Itching  . Metronidazole Other (See Comments)    Palpitations, mild SOB, metallic taste, dry mouth, high blood pressure  . Ciprofloxacin Other (See Comments)    Gaging and achy  . Lisinopril Cough       . Naproxen Nausea Only and Other (See Comments)    Headache  . Other Itching and Other (See Comments)     All pain meds make her itch - has to have something to prevent that in addition to receiving med  . Prednisone Other (See Comments)    Heart beating fast       MEDS:  Current Meds  Medication Sig  . acetaminophen (TYLENOL) 500 MG tablet Take 1,000 mg by mouth every 8 (eight) hours as needed.  Marland Kitchen albuterol (PROVENTIL  HFA;VENTOLIN HFA) 108 (90 Base) MCG/ACT inhaler Inhale 2 puffs into the lungs every 6 (six) hours as needed for wheezing or shortness of breath.  Marland Kitchen aspirin EC 81 MG tablet Take 81 mg by mouth daily.  . chlorthalidone (HYGROTON) 50 MG tablet Take 1 tablet (50 mg total) by mouth daily. DC the previous chlorthalidone 25mg  rx  . diclofenac sodium (VOLTAREN) 1 % GEL Apply 2 g topically 4 (four) times daily.  . DULoxetine (CYMBALTA) 30 MG capsule TAKE 1 CAPSULE BY MOUTH EVERY DAY  . esomeprazole (NEXIUM) 40 MG capsule TAKE 1 CAPSULE BY MOUTH DAILY AT 12 NOON.  . fluticasone (FLONASE) 50 MCG/ACT nasal spray Place 1 spray into both nostrils daily.  Marland Kitchen losartan (COZAAR) 100 MG tablet Take 1 tablet (100 mg total) by mouth daily.  . metoprolol tartrate (LOPRESSOR) 25 MG tablet TAKE 1.5 TABLETS BY MOUTH TWICE A DAY  . Multiple Vitamin (MULTIVITAMIN WITH MINERALS) TABS tablet Take 1 tablet by mouth every Monday, Wednesday, and Friday.  . naproxen sodium (ALEVE) 220 MG tablet Take 220 mg by mouth daily as needed.  Marland Kitchen oxybutynin (DITROPAN-XL) 5 MG 24 hr tablet Take 1 tablet (5 mg total) by mouth at bedtime.  . rosuvastatin (CRESTOR) 20 MG tablet TAKE 1 TABLET BY MOUTH EVERY DAY     ROS: Pertinent items noted in HPI and remainder of comprehensive ROS otherwise negative.  Labs/Other Tests and Data Reviewed:    Recent Labs: 06/26/2018: BUN 9; Creatinine, Ser 0.80; Potassium 4.0; Sodium 141 10/16/2018: ALT 10; Hemoglobin 13.3; Platelets 227   Recent Lipid Panel Lab Results  Component Value Date/Time   CHOL 131 06/26/2018 09:20 AM   TRIG 86 06/26/2018 09:20 AM   HDL 46 06/26/2018 09:20 AM   CHOLHDL 2.8 06/26/2018 09:20 AM   CHOLHDL 3.9 10/15/2013 03:08 PM   LDLCALC 68 06/26/2018 09:20 AM    Wt Readings from Last 3 Encounters:  04/02/19 300 lb (136.1 kg)  12/22/18 (!) 321 lb 4.8 oz (145.7 kg)  10/16/18 (!) 323 lb 14.4 oz (146.9 kg)     Exam:    Vital Signs:  BP 138/68   Ht 5\' 8"  (1.727 m)   Wt  300 lb (136.1 kg)   LMP 01/13/2014 Comment: spotting in Feb and bleeding since April  BMI 45.61 kg/m  General appearance: alert, no distress and morbidly obese Lungs: No audible wheezing or visual respiratory difficulty Abdomen: obese Extremities: extremities normal, atraumatic, no cyanosis or edema Skin: Skin color, texture, turgor normal. No rashes or lesions Neurologic: Mental status: Alert, oriented, thought content appropriate Psych: Pleasant  ASSESSMENT & PLAN:    1. Coronary artery disease status post 5 vessel CABG (2015) -low risk Myoview 02/2017 2. Morbid obesity 3. Hypertension 4. Dyslipidemia 5. OSA, mild not on cPAP 6. Keloid scar - s/p breast reduction, possible upcoming surgery for scar revision 7. Palpitations  Mrs. Bordner is doing well now 5 years past bypass.  She has managed to lose 15 pounds recently which I commended her on.  Her blood pressures generally running around the upper 562 systolic.  She is due for repeat lipid profile.  She had mild OSA but is not on CPAP.  This may have improved with weight loss.  She is getting some occasional palpitations however she says it seems to be worse after eating certain foods.  I counseled her to avoid alcohol, stimulants or caffeine.  COVID-19 Education: The signs and symptoms of COVID-19 were discussed with the patient and how to seek care for testing (follow up with PCP or arrange E-visit).  The importance of social distancing was discussed today.  Patient Risk:   After full review of this patients clinical status, I feel that they are at least moderate risk at this time.  Time:   Today, I have spent 25 minutes with the patient with telehealth technology discussing coronary disease, hypertension, dyslipidemia, OSA, palpitations and COVID-19.     Medication Adjustments/Labs and Tests Ordered: Current medicines are reviewed at length with the patient today.  Concerns regarding medicines are outlined above.   Tests  Ordered: Orders Placed This Encounter  Procedures  . Lipid panel    Medication Changes: No orders of the defined types were placed in this encounter.   Disposition:  in 1 year(s)  [name], MD, [facility], [place] Director of the Advanced Lipid Disorders &  Cardiovascular Risk Reduction Clinic Diplomate of the American Board of Clinical Lipidology Attending Cardiologist  Direct Dial: [phone]  Fax: [phone]  Website:  [email or web address]  [name], MD  04/02/2019 9:24 AM

## 2019-04-02 NOTE — Patient Instructions (Signed)
Medication Instructions:  Your physician recommends that you continue on your current medications as directed. Please refer to the Current Medication list given to you today.  If you need a refill on your cardiac medications before your next appointment, please call your pharmacy.   Lab work: Fasting lab work - completed 04/02/2019 If you have labs (blood work) drawn today and your tests are completely normal, you will receive your results only by: Marland Kitchen MyChart Message (if you have MyChart) OR . A paper copy in the mail If you have any lab test that is abnormal or we need to change your treatment, we will call you to review the results.  Testing/Procedures: NONE  Follow-Up: At [facility], you and your health needs are our priority.  As part of our continuing mission to provide you with exceptional heart care, we have created designated Provider Care Teams.  These Care Teams include your primary Cardiologist (physician) and Advanced Practice Providers (APPs -  Physician Assistants and Nurse Practitioners) who all work together to provide you with the care you need, when you need it. You will need a follow up appointment in 12 months.  Please call our office 2 months in advance to schedule this appointment.  You may see Dr. Debara Pickett or one of the following Advanced Practice Providers on your designated Care Team: Almyra Deforest, [place] . Fabian Sharp, PA-C

## 2019-06-27 ENCOUNTER — Other Ambulatory Visit: Payer: Self-pay | Admitting: Student in an Organized Health Care Education/Training Program

## 2019-06-27 ENCOUNTER — Other Ambulatory Visit: Payer: Self-pay | Admitting: Internal Medicine

## 2019-07-09 ENCOUNTER — Ambulatory Visit (INDEPENDENT_AMBULATORY_CARE_PROVIDER_SITE_OTHER): Payer: Medicare Other | Admitting: Student in an Organized Health Care Education/Training Program

## 2019-07-09 ENCOUNTER — Other Ambulatory Visit: Payer: Self-pay

## 2019-07-09 ENCOUNTER — Encounter: Payer: Self-pay | Admitting: Student in an Organized Health Care Education/Training Program

## 2019-07-09 VITALS — BP 160/59 | HR 51 | Temp 98.3°F | Ht 68.0 in | Wt 317.5 lb

## 2019-07-09 DIAGNOSIS — R011 Cardiac murmur, unspecified: Secondary | ICD-10-CM | POA: Diagnosis not present

## 2019-07-09 DIAGNOSIS — M25461 Effusion, right knee: Secondary | ICD-10-CM | POA: Diagnosis not present

## 2019-07-09 DIAGNOSIS — Z951 Presence of aortocoronary bypass graft: Secondary | ICD-10-CM

## 2019-07-09 DIAGNOSIS — M1711 Unilateral primary osteoarthritis, right knee: Secondary | ICD-10-CM | POA: Diagnosis not present

## 2019-07-09 DIAGNOSIS — I10 Essential (primary) hypertension: Secondary | ICD-10-CM

## 2019-07-09 DIAGNOSIS — Z96652 Presence of left artificial knee joint: Secondary | ICD-10-CM

## 2019-07-09 DIAGNOSIS — F329 Major depressive disorder, single episode, unspecified: Secondary | ICD-10-CM

## 2019-07-09 DIAGNOSIS — Z79899 Other long term (current) drug therapy: Secondary | ICD-10-CM

## 2019-07-09 DIAGNOSIS — F321 Major depressive disorder, single episode, moderate: Secondary | ICD-10-CM

## 2019-07-09 LAB — SYNOVIAL CELL COUNT + DIFF, W/ CRYSTALS
Crystals, Fluid: NONE SEEN
Lymphocytes-Synovial Fld: 10 % (ref 0–20)
Monocyte-Macrophage-Synovial Fluid: 87 % (ref 50–90)
Neutrophil, Synovial: 3 % (ref 0–25)
WBC, Synovial: 36 /mm3 (ref 0–200)

## 2019-07-09 NOTE — Assessment & Plan Note (Signed)
Blood pressure elevated in clinic, this is asymptomatic.  She reports normal blood pressure readings at home.  Says her home blood pressure cuff is usually in the 120s.  Reports good compliance with antihypertensives without side effects.  Plan is to continue with chlorthalidone 50 mg, losartan 100 mg, metoprolol 25 mg daily.  Because her blood pressures are normal at home I am going to hold off on adjusting or adding another agent right now.  Follow-up in 3 months with her blood pressure cuff so we can check accuracy.  If you need to add another medication, likely will add amlodipine.

## 2019-07-09 NOTE — Patient Instructions (Signed)
Today we talked about your high blood pressure.  Continue your medications as prescribed.  Your blood pressure was high in the office but I am reassured that it is normal water at home.  We will check your blood work, follow-up with [place] in 3 months.  We looked at your right knee which causes swelling and pain due to arthritis.  I drained fluid from the right knee and injected a steroid to help with the pain.  I am referring you to orthopedic surgery so you can discuss options for knee replacement surgery.

## 2019-07-09 NOTE — Progress Notes (Signed)
   Assessment and Plan:  See Encounters tab for problem-based medical decision making.   __________________________________________________________  HPI:   63 year old woman here for follow-up of right knee pain.  Patient reports doing fairly well at home recently.  Reports good compliance with her medications without adverse side effects.  Having increased right knee pain over the last several weeks.  Says she has to walk with a cane, the more ambulating she does the more the right knee hurts.  She feels like it is swollen as well.  Denies any fevers or chills.  She reports of a small amount of weight loss, but having trouble doing any exercise because of knee pain.  Denies any chest pain or shortness of breath with exertion.  No fevers or chills.  No recent sick contacts.  She had a telemedicine visit with Dr. Debara Pickett with cardiology and reports no changes.  She has a 5 vessel coronary artery bypass in 2015 which has been doing very well.  She reports some interested in having her right knee surgically replaced.  She had the left knee replaced many years ago, had some difficulties through the surgery but overall says she is satisfied now as the knee does not have any pain.  We have been doing arthrocentesis with steroid injection about every 6 months on the right knee with some good benefit, however she is tired of the injections wearing off and causing decreased functioning.  __________________________________________________________  Problem List: Patient Active Problem List   Diagnosis Date Noted  . Depression 10/16/2018    Priority: High  . Atherosclerosis of aorta ([place]) 06/20/2015    Priority: High  . Coronary atherosclerosis of native coronary artery 01/05/2014    Priority: High  . Osteoarthritis of right knee 11/07/2014    Priority: Medium  . Morbid obesity due to excess calories ([place]) 05/10/2012    Priority: Medium  . Obstructive sleep apnea 03/27/2010    Priority: Medium  .  Essential hypertension 06/30/2007    Priority: Medium  . Osteoarthritis of left shoulder 10/24/2017    Priority: Low  . Peripheral neuropathy 08/19/2017    Priority: Low  . Health care maintenance 10/09/2014    Priority: Low  . GERD 08/06/2008    Priority: Low  . Hyperlipidemia 10/31/2007    Priority: Low    Medications: Reconciled today in Epic __________________________________________________________  Physical Exam:  Vital Signs: Vitals:   07/09/19 0929 07/09/19 1129  BP: (!) 182/74 (!) 160/59  Pulse: 61 (!) 51  Temp: 98.3 F (36.8 C)   TempSrc: Oral   SpO2: 100%   Weight: (!) 317 lb 8 oz (144 kg)   Height: 5\' 8"  (1.727 m)     Gen: Well appearing, NAD CV: RRR, 2 out of 6 early systolic murmur at the left upper sternal border Pulm: Normal effort, CTA throughout, no wheezing Ext: Warm, right knee with moderate joint effusion, the knee is not warm or red, no pain with passive range of motion

## 2019-07-09 NOTE — Assessment & Plan Note (Signed)
Stable.  Improved mood this visit.  I think some of her social stressors are improving.  Plan to continue with Cymbalta 30 mg daily.

## 2019-07-09 NOTE — Assessment & Plan Note (Signed)
Stable right knee osteoarthritis.  It is at times function limiting.  Has a moderate sized effusion on exam today.  Not much improvement with oral NSAIDs at home.  Walks with a cane.  She had good benefit to the left knee joint replacements 5 years ago, however does report stiffness of that knee but no pain.  We talked about continuing to do intermittent injections, generally do 2-3/year.  She is also considering trying a knee replacement surgery on the right.  Only risk for knee replacement is her weight, BMI currently 48 which puts her at increased risk for infection.  Go to refer her to orthopedics so she can discuss her surgical options, I think it would be good for her at age 63 to have a more definitive plan for her right knee.  Today we did another arthrocentesis and provided steroid injection into the right knee for symptomatic improvement of arthritis.  Knee Arthrocentesis with Injection Procedure Note  Diagnosis: right knee osteoarthritis  Indications: Symptomatic relief of large effusion  Anesthesia: Lidocaine 1% without epinephrine  Procedure Details   Point of care ultrasound was used to identify the joint effusion and plan needle trajectory and depth. Consent was obtained for the procedure. The joint was prepped with Betadine. A 18 gauge needle was inserted into the superior aspect of the joint from a lateral approach to access the suprapatellar pouch. 45 ml of clear yellow fluid was removed from the joint and sent to the lab for analysis. 2 ml 1% lidocaine and 40 mg of Triamcinolone was then injected into the joint through the same needle. The needle was removed and the area cleansed and dressed.  Complications:  None; patient tolerated the procedure well.

## 2019-07-10 ENCOUNTER — Encounter: Payer: Self-pay | Admitting: Student in an Organized Health Care Education/Training Program

## 2019-07-10 LAB — BMP8+ANION GAP
Anion Gap: 14 mmol/L (ref 10.0–18.0)
BUN/Creatinine Ratio: 11 — ABNORMAL LOW (ref 12–28)
BUN: 10 mg/dL (ref 8–27)
CO2: 23 mmol/L (ref 20–29)
Calcium: 9.1 mg/dL (ref 8.7–10.3)
Chloride: 101 mmol/L (ref 96–106)
Creatinine, Ser: 0.88 mg/dL (ref 0.57–1.00)
GFR calc Af Amer: 80 mL/min/{1.73_m2} (ref >59)
GFR calc non Af Amer: 69 mL/min/{1.73_m2} (ref >59)
Glucose: 103 mg/dL — ABNORMAL HIGH (ref 65–99)
Potassium: 4.1 mmol/L (ref 3.5–5.2)
Sodium: 138 mmol/L (ref 134–144)

## 2019-07-13 ENCOUNTER — Encounter (HOSPITAL_COMMUNITY): Payer: Self-pay | Admitting: Emergency Medicine

## 2019-07-13 ENCOUNTER — Other Ambulatory Visit: Payer: Self-pay

## 2019-07-13 ENCOUNTER — Ambulatory Visit (INDEPENDENT_AMBULATORY_CARE_PROVIDER_SITE_OTHER)
Admission: EM | Admit: 2019-07-13 | Discharge: 2019-07-13 | Payer: Medicare Other | Source: Home / Self Care | Attending: Family Medicine | Admitting: Family Medicine

## 2019-07-13 ENCOUNTER — Emergency Department (HOSPITAL_COMMUNITY)
Admission: EM | Admit: 2019-07-13 | Discharge: 2019-07-13 | Disposition: A | Discharge status: Home / Self Care | Payer: Medicare Other | Attending: Emergency Medicine | Admitting: Emergency Medicine

## 2019-07-13 ENCOUNTER — Emergency Department (HOSPITAL_COMMUNITY): Payer: Medicare Other

## 2019-07-13 DIAGNOSIS — R0789 Other chest pain: Secondary | ICD-10-CM | POA: Insufficient documentation

## 2019-07-13 DIAGNOSIS — I2510 Atherosclerotic heart disease of native coronary artery without angina pectoris: Secondary | ICD-10-CM | POA: Diagnosis not present

## 2019-07-13 DIAGNOSIS — Z79899 Other long term (current) drug therapy: Secondary | ICD-10-CM | POA: Diagnosis not present

## 2019-07-13 DIAGNOSIS — Z7982 Long term (current) use of aspirin: Secondary | ICD-10-CM | POA: Insufficient documentation

## 2019-07-13 DIAGNOSIS — I10 Essential (primary) hypertension: Secondary | ICD-10-CM | POA: Diagnosis not present

## 2019-07-13 DIAGNOSIS — R079 Chest pain, unspecified: Secondary | ICD-10-CM | POA: Diagnosis not present

## 2019-07-13 LAB — CBC
HCT: 41.8 % (ref 36.0–46.0)
Hemoglobin: 13.5 g/dL (ref 12.0–15.0)
MCH: 27.9 pg (ref 26.0–34.0)
MCHC: 32.3 g/dL (ref 30.0–36.0)
MCV: 86.4 fL (ref 80.0–100.0)
Platelets: 222 10*3/uL (ref 150–400)
RBC: 4.84 MIL/uL (ref 3.87–5.11)
RDW: 15 % (ref 11.5–15.5)
WBC: 8.7 10*3/uL (ref 4.0–10.5)
nRBC: 0 % (ref 0.0–0.2)

## 2019-07-13 LAB — BASIC METABOLIC PANEL
Anion gap: 9 (ref 5–15)
BUN: 13 mg/dL (ref 8–23)
CO2: 26 mmol/L (ref 22–32)
Calcium: 8.8 mg/dL — ABNORMAL LOW (ref 8.9–10.3)
Chloride: 104 mmol/L (ref 98–111)
Creatinine, Ser: 1.03 mg/dL — ABNORMAL HIGH (ref 0.44–1.00)
GFR calc Af Amer: >60 mL/min (ref >60)
GFR calc non Af Amer: 57 mL/min — ABNORMAL LOW (ref >60)
Glucose, Bld: 112 mg/dL — ABNORMAL HIGH (ref 70–99)
Potassium: 3.9 mmol/L (ref 3.5–5.1)
Sodium: 139 mmol/L (ref 135–145)

## 2019-07-13 LAB — TROPONIN I (HIGH SENSITIVITY)
Troponin I (High Sensitivity): 5 ng/L (ref <18)
Troponin I (High Sensitivity): 5 ng/L (ref <18)

## 2019-07-13 MED ORDER — SODIUM CHLORIDE 0.9% FLUSH: 3.0000 mL | Freq: Once | INTRAVENOUS | Status: DC

## 2019-07-13 NOTE — Discharge Instructions (Signed)
Please go to the ER for further evaluation of your chest pressure and fatigue

## 2019-07-13 NOTE — ED Notes (Signed)
Staff offered to transport patient to emergency room, pt refused. She told staff she will drive her car to the ED.

## 2019-07-13 NOTE — ED Provider Notes (Signed)
[facility] EMERGENCY DEPARTMENT Provider Note   CSN: [name]:[id number] Arrival date & time: 07/13/19  1540     History   Chief Complaint Chief Complaint  Patient presents with  . Chest Pain    HPI [name] is a 63 y.o. female who presents with chest tightness. PMH significant for CAD s/p CABG, obesity, HTN. She states that for the past several weeks she has had episodes of chest tightness. Over the past several days the chest tightness has become more noticeable and radiates to her throat. She also notes her BP has been running high at home and she's been getting "hot flashes". She checks her vitals at home and states everything else has been normal except the BP. She states she's had a cough for a long time but only until recently it's been productive. Today she notes she was very fatigued like she was before her CABG and she had to lay down. Since this was different from normal she decided to go to UC. They were concerned for ACS and so referred her to the ED. She denies fever, chills, lightheadedness, abdominal pain, N/V, leg swelling. She tested negative for COVID recently. She mainly came today to make sure that she didn't have pneumonia.      HPI  Past Medical History:  Diagnosis Date  . Acute urinary retention s/p Foley 01/07/2012  . CAD (coronary artery disease)    a. 12/2013 s/p CABG x 5 Dr. Darcey Nora (LIMA to LAD, SVG to diagonal, SVG to OM1, SVG to OM2, SVG to PDA)  . Carotid arterial disease ([name])    a. 12/2013 Carotid U/S: 1-39% bilat ICA stenosis.  . Carpal tunnel syndrome, bilateral   . Hemorrhoids, internal, with bleeding & prolapse 12/13/2011  . Hernia, abdominal   . History of blood transfusion    CABG and hysterectomy  . History of kidney stones   . Hypertension   . Iron deficiency 12-07-2011  . Keloid scar    a. Sternal Keloid s/p CABG with ongoing pain.  . Morbid obesity ([name])   . Myocardial infarct ([name])   . Neuropathy   . Osteoarthritis    . Osteoporosis   . Seasonal allergies   . Sleep apnea    no CPAP machine; sleep study 02/2010 REM AHI 61.7/hr, total sleep REM 14.8/hr  . Uterine cancer ([name]) 7/15   clinical stage IA grade 1 endometrioid endometrial cancer    Patient Active Problem List   Diagnosis Date Noted  . Depression 10/16/2018  . Osteoarthritis of left shoulder 10/24/2017  . Peripheral neuropathy 08/19/2017  . Atherosclerosis of aorta ([place]) 06/20/2015  . Osteoarthritis of right knee 11/07/2014  . Health care maintenance 10/09/2014  . Coronary atherosclerosis of native coronary artery 01/05/2014  . Morbid obesity due to excess calories ([name]) 05/10/2012  . Obstructive sleep apnea 03/27/2010  . GERD 08/06/2008  . Hyperlipidemia 10/31/2007  . Essential hypertension 06/30/2007    Past Surgical History:  Procedure Laterality Date  . ABDOMINAL HYSTERECTOMY    . BREAST REDUCTION SURGERY Bilateral 10/10/2015   Procedure: BILATERAL MAMMARY REDUCTION  (BREAST) WITH FREE NIPPLE GRAFT;  Surgeon: Irene Limbo, MD;  Location: [facility];  Service: Plastics;  Laterality: Bilateral;  . BREAST SURGERY     reduction  . CARDIAC CATHETERIZATION    . COLONOSCOPY  2009  . COLONOSCOPY W/ BIOPSIES AND POLYPECTOMY    . CORONARY ARTERY BYPASS GRAFT N/A 01/09/2014   Procedure: CORONARY ARTERY BYPASS GRAFTING (CABG) x 5 using  left internal mammary artery and right leg greater saphenous vein harvested endoscopically;  Surgeon: Tracy Poot, MD;  Location: [facility];  Service: Open Heart Surgery;  Laterality: N/A;  please use bed extenders and breast binder  . INTRAOPERATIVE TRANSESOPHAGEAL ECHOCARDIOGRAM N/A 01/09/2014   Procedure: INTRAOPERATIVE TRANSESOPHAGEAL ECHOCARDIOGRAM;  Surgeon: Tracy Poot, MD;  Location: [facility];  Service: Open Heart Surgery;  Laterality: N/A;  . KNEE ARTHROSCOPY  1991  . KNEE SURGERY Bilateral 1999  . LEFT HEART CATHETERIZATION WITH CORONARY ANGIOGRAM N/A 01/04/2014   Procedure: LEFT HEART CATHETERIZATION  WITH CORONARY ANGIOGRAM;  Surgeon: Tracy Grooms, MD;  Location: [facility] C[facility] LAB;  Service: Cardiovascular;  Laterality: N/A;  . LESION EXCISION WITH COMPLEX REPAIR N/A 05/07/2016   Procedure: COMPLEX REPAIR OF CHEST 16 CM;  Surgeon: Irene Limbo, MD;  Location: [place];  Service: Plastics;  Laterality: N/A;  . MULTIPLE EXTRACTIONS WITH ALVEOLOPLASTY N/A 04/11/2014   Procedure: Extraction of tooth #'s 1,2,8,16 with alveoloplasty, maxillary tuberosity reductions, and gross debridement of remaining teeth.;  Surgeon: [name], DDS;  Location: [place];  Service: Oral Surgery;  Laterality: N/A;  . MULTIPLE TOOTH EXTRACTIONS    . NM MYOCAR PERF WALL MOTION  01/2010   dipyridamole myoview - moderate perfusion defect in basal inferoseptal, basal inferior, mid inferoseptal, mid inferior, apical inferior region; EF 56%  . TEE WITHOUT CARDIOVERSION  01/2010   EF 60-65%, small, flat, non-infiltrating, calcified, fixed apical/septal mass  . TOTAL KNEE ARTHROPLASTY Left 04/29/2011  . transanal hemorrhoidal dearterliaization  01/06/12   with external hemorrhoid removal  . UNILATERAL SALPINGECTOMY Right 06/03/2014   Procedure: UNILATERAL SALPINGECTOMY;  Surgeon: Tracy Drafts, MD;  Location: [facility];  Service: Gynecology;  Laterality: Right;  [name] VAGINAL HYSTERECTOMY N/A 06/03/2014   Procedure: HYSTERECTOMY VAGINAL;  Surgeon: Tracy Drafts, MD;  Location: [facility];  Service: Gynecology;  Laterality: N/A;  . WISDOM TOOTH EXTRACTION       OB History    Gravida  2   Para  2   Term  2   Preterm  0   AB  0   Living  2     SAB  0   TAB  0   Ectopic  0   Multiple  0   Live Births               Home Medications    Prior to Admission medications   Medication Sig Start Date End Date Taking? Authorizing Provider  acetaminophen (TYLENOL) 500 MG tablet Take 1,000 mg by mouth every 8 (eight) hours as needed.    [provider]  albuterol (PROVENTIL HFA;VENTOLIN HFA) 108  (90 Base) MCG/ACT inhaler Inhale 2 puffs into the lungs every 6 (six) hours as needed for wheezing or shortness of breath. 12/22/18   Kathi Ludwig, MD  aspirin EC 81 MG tablet Take 81 mg by mouth daily.    [provider]  chlorthalidone (HYGROTON) 50 MG tablet Take 1 tablet (50 mg total) by mouth daily. DC the previous chlorthalidone 25mg  rx 10/16/18   Axel Filler, MD  diclofenac sodium (VOLTAREN) 1 % GEL Apply 2 g topically 4 (four) times daily. 02/05/19   Axel Filler, MD  DULoxetine (CYMBALTA) 30 MG capsule TAKE 1 CAPSULE BY MOUTH EVERY DAY 11/08/18   Axel Filler, MD  esomeprazole (Solen) 40 MG capsule TAKE 1 CAPSULE BY MOUTH DAILY AT 12 NOON. 11/29/18   Axel Filler, MD  fluticasone [facility]) 50  MCG/ACT nasal spray Place 1 spray into both nostrils daily. 03/15/19 03/14/20  Katherine Roan, MD  losartan (COZAAR) 100 MG tablet TAKE 1 TABLET BY MOUTH EVERY DAY 06/27/19   Axel Filler, MD  metoprolol tartrate (LOPRESSOR) 25 MG tablet TAKE 1 AND 1/2 TABLETS BY MOUTH TWICE A DAY 06/27/19   Hilty, Nadean Corwin, MD  Multiple Vitamin (MULTIVITAMIN WITH MINERALS) TABS tablet Take 1 tablet by mouth every Monday, Wednesday, and Friday.    [provider]  naproxen sodium (ALEVE) 220 MG tablet Take 220 mg by mouth daily as needed.    [provider]  oxybutynin (DITROPAN-XL) 5 MG 24 hr tablet Take 1 tablet (5 mg total) by mouth at bedtime. 08/22/18   Tracy Drafts, MD  rosuvastatin (CRESTOR) 20 MG tablet TAKE 1 TABLET BY MOUTH EVERY DAY 11/29/18   Axel Filler, MD    Family History Family History  Problem Relation Age of Onset  . Heart attack Mother 63  . Heart disease Mother   . Hypertension Mother   . Diabetes Mother   . Hypertension Sister   . Heart failure Sister   . Heart disease Sister   . Diabetes Sister   . Kidney disease Brother        hypertension  . Hypertension Daughter   . Colon cancer Neg  Hx     Social History Social History   Tobacco Use  . Smoking status: Never Smoker  . Smokeless tobacco: Never Used  Substance Use Topics  . Alcohol use: No    Alcohol/week: 0.0 standard drinks  . Drug use: No     Allergies   Cephalosporins, Eicosapentaenoic acid (epa), Fish-derived products, Peanuts [peanut oil], Shrimp [shellfish allergy], Latex, Metronidazole, Ciprofloxacin, Lisinopril, Naproxen, Other, and Prednisone   Review of Systems Review of Systems  Constitutional: Positive for fatigue. Negative for chills and fever.  Respiratory: Positive for cough and chest tightness. Negative for shortness of breath and wheezing.   Cardiovascular: Positive for chest pain. Negative for palpitations and leg swelling.  Gastrointestinal: Negative for abdominal pain, nausea and vomiting.  Neurological: Negative for syncope and light-headedness.  All other systems reviewed and are negative.    Physical Exam Updated Vital Signs BP (!) 195/89 (BP Location: Left Arm)   Pulse 65   Temp 98.3 F (36.8 C) (Oral)   Resp 18   LMP 01/13/2014 Comment: spotting in Feb and bleeding since April  SpO2 100%   Physical Exam Vitals signs and nursing note reviewed.  Constitutional:      General: She is not in acute distress.    Appearance: She is well-developed. She is obese. She is not ill-appearing.     Comments: Calm, cooperative. NAD. Pleasant  HENT:     Head: Normocephalic and atraumatic.  Eyes:     General: No scleral icterus.       Right eye: No discharge.        Left eye: No discharge.     Conjunctiva/sclera: Conjunctivae normal.     Pupils: Pupils are equal, round, and reactive to light.  Neck:     Musculoskeletal: Normal range of motion.  Cardiovascular:     Rate and Rhythm: Normal rate and regular rhythm.  Pulmonary:     Effort: Pulmonary effort is normal. No respiratory distress.     Breath sounds: Normal breath sounds.     Comments: Keloid scar over the chest wall from  CABG Chest:     Chest wall: No tenderness.  Abdominal:     General: There is no distension.     Palpations: Abdomen is soft.     Tenderness: There is no abdominal tenderness.  Musculoskeletal:     Right lower leg: No edema.     Left lower leg: No edema.  Skin:    General: Skin is warm and dry.  Neurological:     Mental Status: She is alert and oriented to person, place, and time.  Psychiatric:        Behavior: Behavior normal.      ED Treatments / Results  Labs (all labs ordered are listed, but only abnormal results are displayed) Labs Reviewed  BASIC METABOLIC PANEL - Abnormal; Notable for the following components:      Result Value   Glucose, Bld 112 (*)    Creatinine, Ser 1.03 (*)    Calcium 8.8 (*)    GFR calc non Af Amer 57 (*)    All other components within normal limits  CBC  TROPONIN I (HIGH SENSITIVITY)  TROPONIN I (HIGH SENSITIVITY)    EKG EKG Interpretation  Date/Time:  Friday July 13 2019 22:23:58 EDT Ventricular Rate:  52 PR Interval:    QRS Duration: 118 QT Interval:  495 QTC Calculation: 461 R Axis:   49 Text Interpretation:  Sinus rhythm Nonspecific intraventricular conduction delay Confirmed by Virgel Manifold [phone]) on 07/13/2019 10:52:05 PM   Radiology Dg Chest 2 View  Result Date: 07/13/2019 CLINICAL DATA:  Chest pain EXAM: CHEST - 2 VIEW COMPARISON:  10/19/2017 FINDINGS: Cardiomegaly status post median sternotomy and CABG. Both lungs are clear. The visualized skeletal structures are unremarkable. IMPRESSION: Cardiomegaly without acute abnormality of the lungs. Electronically Signed   By: Eddie Candle M.D.   On: 07/13/2019 16:57    Procedures Procedures (including critical care time)  Medications Ordered in ED Medications  sodium chloride flush (NS) 0.9 % injection 3 mL (has no administration in time range)     Initial Impression / Assessment and Plan / ED Course  I have reviewed the triage vital signs and the nursing notes.   Pertinent labs & imaging results that were available during my care of the patient were reviewed by me and considered in my medical decision making (see chart for details).  63 year old female presents with chest tightness. It's been ongoing for weeks but worse today. It is somewhat atypical. It's better with albuterol and worse when she eats. She was primarily worried about pneumonia because she's had a cough. She is hypertensive but otherwise vitals are normal. Exam is unremarkable. EKG is sinus rhythm. CXR is negative for edema or pneumonia. Labs are reassuring. 1st and 2nd trop are normal. She states she would like to go home if possible. Discussed with Dr. Wilson Singer. Will d/c pt home - she will call Dr. Debara Pickett in the morning.  Final Clinical Impressions(s) / ED Diagnoses   Final diagnoses:  Chest tightness    ED Discharge Orders    None       Tracy Evangelist, PA-C 07/13/19 2310    Virgel Manifold, MD 07/16/19 [phone]

## 2019-07-13 NOTE — ED Triage Notes (Signed)
Pt here for eval of 5 days of intermittent mid chest pain radiating to the shoulder, that got worse the past two days. Pt states she feels very tired. Tightness in upper chest/to throat. Pain currently 8/10.

## 2019-07-13 NOTE — Addendum Note (Signed)
Addended by: Hulan Fray on: 07/13/2019 09:47 AM   Modules accepted: Orders

## 2019-07-13 NOTE — ED Triage Notes (Signed)
Pt states for the last 3 days shes had some pressure on her upper chest, states drinking warm fluids and lying on her stomach seems to help with the pain.

## 2019-07-13 NOTE — ED Notes (Signed)
[name] (Granddaughter/POC# [phone]) called for an update.

## 2019-07-13 NOTE — ED Provider Notes (Signed)
[facility]    CSN: [id number] Arrival date & time: 07/13/19  1429      History   Chief Complaint Chief Complaint  Patient presents with  . Epigastric Pain    HPI [name] is a 63 y.o. female.   [name] presents with complaints of chest pressure. On and off for the past three days. Feels the pressure right now. Radiates to neck. She has been fatigued, unable to complete her usual tasks. No cough, no shortness of breath . No fever or URI symptoms. Has left shoulder pain, unable to raise her left arm up. No known injury. Sometimes has episodes of diaphoresis, denies currently. No nausea or vomiting. Has had similar in the past with pneumonia. No known ill contacts. Hasn't taken any medications for her symptoms. History of CABG, CAD, abdominal hernia, htn, obesity, OA, sleep apnea.      ROS per HPI, negative if not otherwise mentioned.      Past Medical History:  Diagnosis Date  . Acute urinary retention s/p Foley 01/07/2012  . CAD (coronary artery disease)    a. 12/2013 s/p CABG x 5 Dr. Darcey Nora (LIMA to LAD, SVG to diagonal, SVG to OM1, SVG to OM2, SVG to PDA)  . Carotid arterial disease ([place])    a. 12/2013 Carotid U/S: 1-39% bilat ICA stenosis.  . Carpal tunnel syndrome, bilateral   . Hemorrhoids, internal, with bleeding & prolapse 12/13/2011  . Hernia, abdominal   . History of blood transfusion    CABG and hysterectomy  . History of kidney stones   . Hypertension   . Iron deficiency 12-07-2011  . Keloid scar    a. Sternal Keloid s/p CABG with ongoing pain.  . Morbid obesity ([place])   . Myocardial infarct ([place])   . Neuropathy   . Osteoarthritis   . Osteoporosis   . Seasonal allergies   . Sleep apnea    no CPAP machine; sleep study 02/2010 REM AHI 61.7/hr, total sleep REM 14.8/hr  . Uterine cancer ([place]) 7/15   clinical stage IA grade 1 endometrioid endometrial cancer    Patient Active Problem List   Diagnosis Date Noted  . Depression  10/16/2018  . Osteoarthritis of left shoulder 10/24/2017  . Peripheral neuropathy 08/19/2017  . Atherosclerosis of aorta ([place]) 06/20/2015  . Osteoarthritis of right knee 11/07/2014  . Health care maintenance 10/09/2014  . Coronary atherosclerosis of native coronary artery 01/05/2014  . Morbid obesity due to excess calories ([place]) 05/10/2012  . Obstructive sleep apnea 03/27/2010  . GERD 08/06/2008  . Hyperlipidemia 10/31/2007  . Essential hypertension 06/30/2007    Past Surgical History:  Procedure Laterality Date  . ABDOMINAL HYSTERECTOMY    . BREAST REDUCTION SURGERY Bilateral 10/10/2015   Procedure: BILATERAL MAMMARY REDUCTION  (BREAST) WITH FREE NIPPLE GRAFT;  Surgeon: Irene Limbo, MD;  Location: [facility];  Service: Plastics;  Laterality: Bilateral;  . BREAST SURGERY     reduction  . CARDIAC CATHETERIZATION    . COLONOSCOPY  2009  . COLONOSCOPY W/ BIOPSIES AND POLYPECTOMY    . CORONARY ARTERY BYPASS GRAFT N/A 01/09/2014   Procedure: CORONARY ARTERY BYPASS GRAFTING (CABG) x 5 using left internal mammary artery and right leg greater saphenous vein harvested endoscopically;  Surgeon: Ivin Poot, MD;  Location: [place];  Service: Open Heart Surgery;  Laterality: N/A;  please use bed extenders and breast binder  . INTRAOPERATIVE TRANSESOPHAGEAL ECHOCARDIOGRAM N/A 01/09/2014   Procedure: INTRAOPERATIVE TRANSESOPHAGEAL ECHOCARDIOGRAM;  Surgeon: Collier Salina  Prescott Gum, MD;  Location: [place];  Service: Open Heart Surgery;  Laterality: N/A;  . KNEE ARTHROSCOPY  1991  . KNEE SURGERY Bilateral 1999  . LEFT HEART CATHETERIZATION WITH CORONARY ANGIOGRAM N/A 01/04/2014   Procedure: LEFT HEART CATHETERIZATION WITH CORONARY ANGIOGRAM;  Surgeon: Sinclair Grooms, MD;  Location: [facility] CATH LAB;  Service: Cardiovascular;  Laterality: N/A;  . LESION EXCISION WITH COMPLEX REPAIR N/A 05/07/2016   Procedure: COMPLEX REPAIR OF CHEST 16 CM;  Surgeon: Irene Limbo, MD;  Location: [place];  Service: Plastics;   Laterality: N/A;  . MULTIPLE EXTRACTIONS WITH ALVEOLOPLASTY N/A 04/11/2014   Procedure: Extraction of tooth #'s 1,2,8,16 with alveoloplasty, maxillary tuberosity reductions, and gross debridement of remaining teeth.;  Surgeon: Lenn Cal, DDS;  Location: [place];  Service: Oral Surgery;  Laterality: N/A;  . MULTIPLE TOOTH EXTRACTIONS    . NM MYOCAR PERF WALL MOTION  01/2010   dipyridamole myoview - moderate perfusion defect in basal inferoseptal, basal inferior, mid inferoseptal, mid inferior, apical inferior region; EF 56%  . TEE WITHOUT CARDIOVERSION  01/2010   EF 60-65%, small, flat, non-infiltrating, calcified, fixed apical/septal mass  . TOTAL KNEE ARTHROPLASTY Left 04/29/2011  . transanal hemorrhoidal dearterliaization  01/06/12   with external hemorrhoid removal  . UNILATERAL SALPINGECTOMY Right 06/03/2014   Procedure: UNILATERAL SALPINGECTOMY;  Surgeon: Lavonia Drafts, MD;  Location: [facility];  Service: Gynecology;  Laterality: Right;  Marland Kitchen VAGINAL HYSTERECTOMY N/A 06/03/2014   Procedure: HYSTERECTOMY VAGINAL;  Surgeon: Lavonia Drafts, MD;  Location: [facility];  Service: Gynecology;  Laterality: N/A;  . WISDOM TOOTH EXTRACTION      OB History    Gravida  2   Para  2   Term  2   Preterm  0   AB  0   Living  2     SAB  0   TAB  0   Ectopic  0   Multiple  0   Live Births               Home Medications    Prior to Admission medications   Medication Sig Start Date End Date Taking? Authorizing Provider  acetaminophen (TYLENOL) 500 MG tablet Take 1,000 mg by mouth every 8 (eight) hours as needed.    [provider]  albuterol (PROVENTIL HFA;VENTOLIN HFA) 108 (90 Base) MCG/ACT inhaler Inhale 2 puffs into the lungs every 6 (six) hours as needed for wheezing or shortness of breath. 12/22/18   Kathi Ludwig, MD  aspirin EC 81 MG tablet Take 81 mg by mouth daily.    [provider]  chlorthalidone (HYGROTON) 50 MG tablet Take 1 tablet (50  mg total) by mouth daily. DC the previous chlorthalidone 25mg  rx 10/16/18   Axel Filler, MD  diclofenac sodium (VOLTAREN) 1 % GEL Apply 2 g topically 4 (four) times daily. 02/05/19   Axel Filler, MD  DULoxetine (CYMBALTA) 30 MG capsule TAKE 1 CAPSULE BY MOUTH EVERY DAY 11/08/18   Axel Filler, MD  esomeprazole (NEXIUM) 40 MG capsule TAKE 1 CAPSULE BY MOUTH DAILY AT 12 NOON. 11/29/18   Axel Filler, MD  fluticasone [facility]) 50 MCG/ACT nasal spray Place 1 spray into both nostrils daily. 03/15/19 03/14/20  Katherine Roan, MD  losartan (COZAAR) 100 MG tablet TAKE 1 TABLET BY MOUTH EVERY DAY 06/27/19   Axel Filler, MD  metoprolol tartrate (LOPRESSOR) 25 MG tablet TAKE 1 AND 1/2 TABLETS BY MOUTH TWICE A DAY 06/27/19  [name], MD  Multiple Vitamin (MULTIVITAMIN WITH MINERALS) TABS tablet Take 1 tablet by mouth every Monday, Wednesday, and Friday.    [provider]  naproxen sodium (ALEVE) 220 MG tablet Take 220 mg by mouth daily as needed.    [provider]  oxybutynin (DITROPAN-XL) 5 MG 24 hr tablet Take 1 tablet (5 mg total) by mouth at bedtime. 08/22/18   Lavonia Drafts, MD  rosuvastatin (CRESTOR) 20 MG tablet TAKE 1 TABLET BY MOUTH EVERY DAY 11/29/18   Axel Filler, MD    Family History Family History  Problem Relation Age of Onset  . Heart attack Mother 70  . Heart disease Mother   . Hypertension Mother   . Diabetes Mother   . Hypertension Sister   . Heart failure Sister   . Heart disease Sister   . Diabetes Sister   . Kidney disease Brother        hypertension  . Hypertension Daughter   . Colon cancer Neg Hx     Social History Social History   Tobacco Use  . Smoking status: Never Smoker  . Smokeless tobacco: Never Used  Substance Use Topics  . Alcohol use: No    Alcohol/week: 0.0 standard drinks  . Drug use: No     Allergies   Cephalosporins, Eicosapentaenoic acid (epa),  Fish-derived products, Peanuts [peanut oil], Shrimp [shellfish allergy], Latex, Metronidazole, Ciprofloxacin, Lisinopril, Naproxen, Other, and Prednisone   Review of Systems Review of Systems   Physical Exam Triage Vital Signs ED Triage Vitals  Enc Vitals Group     BP 07/13/19 1446 (!) 161/80     Pulse Rate 07/13/19 1446 64     Resp 07/13/19 1446 18     Temp 07/13/19 1446 97.8 F (36.6 C)     Temp src --      SpO2 07/13/19 1446 98 %     Weight --      Height --      Head Circumference --      Peak Flow --      Pain Score 07/13/19 1449 0     Pain Loc --      Pain Edu? --      Excl. in [place]? --    No data found.  Updated Vital Signs BP (!) 161/80   Pulse 64   Temp 97.8 F (36.6 C)   Resp 18   LMP 01/13/2014 Comment: spotting in Feb and bleeding since April  SpO2 98%    Physical Exam Constitutional:      General: She is not in acute distress.    Appearance: She is well-developed.  Cardiovascular:     Rate and Rhythm: Normal rate.  Pulmonary:     Effort: Pulmonary effort is normal.  Skin:    General: Skin is warm and dry.  Neurological:     Mental Status: She is alert and oriented to person, place, and time.    EKG:  Sinus bradycardia, rate of 57 . Previous EKG was available for review. No stwave changes as interpreted by me.    UC Treatments / Results  Labs (all labs ordered are listed, but only abnormal results are displayed) Labs Reviewed - No data to display  EKG   Radiology No results found.  Procedures Procedures (including critical care time)  Medications Ordered in UC Medications - No data to display  Initial Impression / Assessment and Plan / UC Course  I have reviewed the triage vital signs  and the nursing notes.  Pertinent labs & imaging results that were available during my care of the patient were reviewed by me and considered in my medical decision making (see chart for details).     Chest pressure in this 63 year old obese  patient with history of CABG. EKG without changes currently. I do recommend eval in the ER for ACS rule out at this time. Patient provided assistance to ER. Patient verbalized understanding and agreeable to plan.   Final Clinical Impressions(s) / UC Diagnoses   Final diagnoses:  Chest tightness     Discharge Instructions     Please go to the ER for further evaluation of your chest pressure and fatigue    ED Prescriptions    None     Controlled Substance Prescriptions [place] Controlled Substance Registry consulted? Not Applicable   Zigmund Gottron, NP 07/13/19 1517

## 2019-07-13 NOTE — Discharge Instructions (Signed)
Please call Dr. Debara Pickett tomorrow Use your inhaler as needed

## 2019-07-18 ENCOUNTER — Other Ambulatory Visit: Payer: Self-pay | Admitting: Internal Medicine

## 2019-07-19 ENCOUNTER — Telehealth (INDEPENDENT_AMBULATORY_CARE_PROVIDER_SITE_OTHER): Payer: Medicare Other | Admitting: Internal Medicine

## 2019-07-19 VITALS — BP 147/67 | HR 68 | Temp 98.2°F | Ht 68.0 in | Wt 311.6 lb

## 2019-07-19 DIAGNOSIS — Z951 Presence of aortocoronary bypass graft: Secondary | ICD-10-CM

## 2019-07-19 DIAGNOSIS — I2510 Atherosclerotic heart disease of native coronary artery without angina pectoris: Secondary | ICD-10-CM

## 2019-07-19 DIAGNOSIS — I10 Essential (primary) hypertension: Secondary | ICD-10-CM

## 2019-07-19 DIAGNOSIS — R0789 Other chest pain: Secondary | ICD-10-CM

## 2019-07-19 NOTE — Patient Instructions (Signed)
Medication Instructions:  Dr Debara Pickett recommends that you continue on your current medications as directed. Please refer to the Current Medication list given to you today.  If you need a refill on your cardiac medications before your next appointment, please call your pharmacy.   Follow-Up: At [facility], you and your health needs are our priority.  As part of our continuing mission to provide you with exceptional heart care, we have created designated Provider Care Teams.  These Care Teams include your primary Cardiologist (physician) and Advanced Practice Providers (APPs -  Physician Assistants and Nurse Practitioners) who all work together to provide you with the care you need, when you need it. You will need a follow up appointment in 12 months.  Please call our office 2 months in advance to schedule this appointment.  You may see [name], MD or one of the following Advanced Practice Providers on your designated Care Team: [place], [place] . Fabian Sharp, PA-C . You will receive a reminder letter in the mail two months in advance. If you don't receive a letter, please call our office to schedule the follow-up appointment.

## 2019-07-19 NOTE — Progress Notes (Signed)
Virtual Visit via Video Note   This visit type was conducted due to national recommendations for restrictions regarding the COVID-19 Pandemic (e.g. social distancing) in an effort to limit this patient's exposure and mitigate transmission in our community.  Due to her co-morbid illnesses, this patient is at least at moderate risk for complications without adequate follow up.  This format is felt to be most appropriate for this patient at this time.  All issues noted in this document were discussed and addressed.  A limited physical exam was performed with this format.  Please refer to the patient's chart for her consent to telehealth for [facility].   Evaluation Performed:  Doxy.me video visit  Date:  07/19/2019   ID:  [name], DOB [id number], MRN [id number]  Patient Location:  [address] [place] [address]  Provider location:   [address] [place] [address], [place] [address]  PCP:  Axel Filler, MD  Cardiologist:  [name], MD Electrophysiologist:  None   Chief Complaint:  Follow-up ER visit  History of Present Illness:    [name] is a 63 y.o. female who presents via audio/video conferencing for a telehealth visit today.  Mrs. [name] was seen today for video follow-up.  She was somewhat confused and presented to the office but did a video follow-up from her car.  Overall she is feeling well.  She attended her daughter's wedding this past weekend.  There are about 45 people wearing masks and socially distance about 6 feet apart.  She denies any chest pain or shortness of breath.  She has lost about 15 pounds and says she is feeling better.  I suspect this is helped her numbers overall.  She did have one episode where she woke up short of breath the middle the night.  There is concern from her PCP that this could be sleep apnea although her study in 2018 was mild and she is not on therapy.  She is also lost weight so suspect that that  is improved.  She is now 5 years past bypass surgery.  She reports very infrequent chest wall tenderness mainly at her keloid scar but has had some improvement after bilateral breast reduction 3 years ago.  She is overdue for lipid profile however LDL was controlled with less than 70.  Her last stress test was in 2018 which was nonischemic.  07/19/2019  [name] was seen today for video follow-up.  I saw her last via video visit in May.  She was doing well however just recently she was in the ER with chest discomfort.  She felt upper chest and throat tightness.  She also noted her blood pressure was elevated and she was having some shooting pains in different places in her body.  She has been struggling with constipation as well for about a month.  She was monitored and had high-sensitivity troponins which were both 5 (-).  EKG personally reviewed showed a sinus bradycardia with no ischemic changes.  Her pain was felt to be noncardiac.  She says since then it is been improving.  She gets some chest wall pain and again some fleeting pains at different places as well as headache.  She wonders if this is possibly neurologic.  She also mentions her constipation and she is starting to gain some ground they are using bulking agents, MiraLAX and stool softeners.  The patient does not have symptoms concerning for COVID-19 infection (fever, chills, cough, or  new SHORTNESS OF BREATH).    Prior CV studies:   The following studies were reviewed today:  Chart review Lipid profile  PMHx:  Past Medical History:  Diagnosis Date   Acute urinary retention s/p Foley 01/07/2012   CAD (coronary artery disease)    a. 12/2013 s/p CABG x 5 Dr. Darcey Nora (LIMA to LAD, SVG to diagonal, SVG to OM1, SVG to OM2, SVG to PDA)   Carotid arterial disease ([place])    a. 12/2013 Carotid U/S: 1-39% bilat ICA stenosis.   Carpal tunnel syndrome, bilateral    Hemorrhoids, internal, with bleeding & prolapse 12/13/2011   Hernia,  abdominal    History of blood transfusion    CABG and hysterectomy   History of kidney stones    Hypertension    Iron deficiency 12-07-2011   Keloid scar    a. Sternal Keloid s/p CABG with ongoing pain.   Morbid obesity ([place])    Myocardial infarct ([place])    Neuropathy    Osteoarthritis    Osteoporosis    Seasonal allergies    Sleep apnea    no CPAP machine; sleep study 02/2010 REM AHI 61.7/hr, total sleep REM 14.8/hr   Uterine cancer ([place]) 7/15   clinical stage IA grade 1 endometrioid endometrial cancer    Past Surgical History:  Procedure Laterality Date   ABDOMINAL HYSTERECTOMY     BREAST REDUCTION SURGERY Bilateral 10/10/2015   Procedure: BILATERAL MAMMARY REDUCTION  (BREAST) WITH FREE NIPPLE GRAFT;  Surgeon: Irene Limbo, MD;  Location: [place];  Service: Plastics;  Laterality: Bilateral;   BREAST SURGERY     reduction   CARDIAC CATHETERIZATION     COLONOSCOPY  2009   COLONOSCOPY W/ BIOPSIES AND POLYPECTOMY     CORONARY ARTERY BYPASS GRAFT N/A 01/09/2014   Procedure: CORONARY ARTERY BYPASS GRAFTING (CABG) x 5 using left internal mammary artery and right leg greater saphenous vein harvested endoscopically;  Surgeon: Ivin Poot, MD;  Location: [place];  Service: Open Heart Surgery;  Laterality: N/A;  please use bed extenders and breast binder   INTRAOPERATIVE TRANSESOPHAGEAL ECHOCARDIOGRAM N/A 01/09/2014   Procedure: INTRAOPERATIVE TRANSESOPHAGEAL ECHOCARDIOGRAM;  Surgeon: Ivin Poot, MD;  Location: [place];  Service: Open Heart Surgery;  Laterality: N/A;   KNEE ARTHROSCOPY  1991   KNEE SURGERY Bilateral 1999   LEFT HEART CATHETERIZATION WITH CORONARY ANGIOGRAM N/A 01/04/2014   Procedure: LEFT HEART CATHETERIZATION WITH CORONARY ANGIOGRAM;  Surgeon: Sinclair Grooms, MD;  Location: [facility] C[facility] LAB;  Service: Cardiovascular;  Laterality: N/A;   LESION EXCISION WITH COMPLEX REPAIR N/A 05/07/2016   Procedure: COMPLEX REPAIR OF CHEST 16 CM;  Surgeon: Irene Limbo, MD;  Location: [place];  Service: Plastics;  Laterality: N/A;   MULTIPLE EXTRACTIONS WITH ALVEOLOPLASTY N/A 04/11/2014   Procedure: Extraction of tooth #'s 1,2,8,16 with alveoloplasty, maxillary tuberosity reductions, and gross debridement of remaining teeth.;  Surgeon: Lenn Cal, DDS;  Location: [place];  Service: Oral Surgery;  Laterality: N/A;   MULTIPLE TOOTH EXTRACTIONS     NM MYOCAR PERF WALL MOTION  01/2010   dipyridamole myoview - moderate perfusion defect in basal inferoseptal, basal inferior, mid inferoseptal, mid inferior, apical inferior region; EF 56%   TEE WITHOUT CARDIOVERSION  01/2010   EF 60-65%, small, flat, non-infiltrating, calcified, fixed apical/septal mass   TOTAL KNEE ARTHROPLASTY Left 04/29/2011   transanal hemorrhoidal dearterliaization  01/06/12   with external hemorrhoid removal   UNILATERAL SALPINGECTOMY Right 06/03/2014   Procedure: UNILATERAL SALPINGECTOMY;  Surgeon: Lavonia Drafts, MD;  Location: [facility];  Service: Gynecology;  Laterality: Right;   VAGINAL HYSTERECTOMY N/A 06/03/2014   Procedure: HYSTERECTOMY VAGINAL;  Surgeon: Lavonia Drafts, MD;  Location: [facility];  Service: Gynecology;  Laterality: N/A;   WISDOM TOOTH EXTRACTION      FAMHx:  Family History  Problem Relation Age of Onset   Heart attack Mother 97   Heart disease Mother    Hypertension Mother    Diabetes Mother    Hypertension Sister    Heart failure Sister    Heart disease Sister    Diabetes Sister    Kidney disease Brother        hypertension   Hypertension Daughter    Colon cancer Neg Hx     SOCHx:   reports that she has never smoked. She has never used smokeless tobacco. She reports that she does not drink alcohol or use drugs.  ALLERGIES:  Allergies  Allergen Reactions   Cephalosporins Anaphylaxis, Swelling and Other (See Comments)    Tongue swelling, gum pain   Eicosapentaenoic Acid (Epa) Anaphylaxis and Shortness Of Breath    Fish-Derived Products Anaphylaxis and Shortness Of Breath   Peanuts [Peanut Oil] Anaphylaxis, Shortness Of Breath and Other (See Comments)    Peanut butter   Shrimp [Shellfish Allergy] Anaphylaxis    Pt states her throat will swell if she eats shrimp.   Latex Itching   Metronidazole Other (See Comments)    Palpitations, mild SOB, metallic taste, dry mouth, high blood pressure   Ciprofloxacin Other (See Comments)    Gaging and achy   Lisinopril Cough        Naproxen Nausea Only and Other (See Comments)    Headache   Other Itching and Other (See Comments)     All pain meds make her itch - has to have something to prevent that in addition to receiving med   Prednisone Other (See Comments)    Heart beating fast       MEDS:  No outpatient medications have been marked as taking for the 07/19/19 encounter (Telemedicine) with [name], MD.     ROS: Pertinent items noted in HPI and remainder of comprehensive ROS otherwise negative.  Labs/Other Tests and Data Reviewed:    Recent Labs: 10/16/2018: ALT 10 07/13/2019: BUN 13; Creatinine, Ser 1.03; Hemoglobin 13.5; Platelets 222; Potassium 3.9; Sodium 139   Recent Lipid Panel Lab Results  Component Value Date/Time   CHOL 127 04/02/2019 09:40 AM   TRIG 99 04/02/2019 09:40 AM   HDL 47 04/02/2019 09:40 AM   CHOLHDL 2.7 04/02/2019 09:40 AM   CHOLHDL 3.9 10/15/2013 03:08 PM   LDLCALC 60 04/02/2019 09:40 AM    Wt Readings from Last 3 Encounters:  07/19/19 (!) 311 lb 9.6 oz (141.3 kg)  07/09/19 (!) 317 lb 8 oz (144 kg)  04/02/19 300 lb (136.1 kg)     Exam:    Vital Signs:  BP (!) 147/67    Pulse 68    Temp 98.2 F (36.8 C)    Ht 5\' 8"  (1.727 m)    Wt (!) 311 lb 9.6 oz (141.3 kg)    LMP 01/13/2014 Comment: spotting in Feb and bleeding since April   SpO2 94%    BMI 47.38 kg/m    General appearance: alert, no distress and morbidly obese Lungs: No audible wheezing or visual respiratory difficulty Abdomen:  obese Extremities: extremities normal, atraumatic, no cyanosis or edema Skin: Skin color, texture, turgor normal.  No rashes or lesions Neurologic: Mental status: Alert, oriented, thought content appropriate Psych: Pleasant  ASSESSMENT & PLAN:    1. Coronary artery disease status post 5 vessel CABG (2015) -low risk Myoview 02/2017 2. Morbid obesity 3. Hypertension 4. Dyslipidemia 5. OSA, mild not on cPAP 6. Keloid scar - s/p breast reduction, possible upcoming surgery for scar revision 7. Palpitations  Mrs. Vullo was recently in the ER for chest pain which sounded atypical.  She ruled out for MI and EKG was nonischemic.  Her symptoms have slowly improved.  I think it is unlikely that this was cardiac or represent a graft dysfunction without any physical findings to support it.  Her pain may be neuropathic as she has some other pains in different areas.  No changes to her regimen today.  She has a recall at a year which we will keep an eye and happy to see her sooner if necessary.  COVID-19 Education: The signs and symptoms of COVID-19 were discussed with the patient and how to seek care for testing (follow up with PCP or arrange E-visit).  The importance of social distancing was discussed today.  Patient Risk:   After full review of this patients clinical status, I feel that they are at least moderate risk at this time.  Time:   Today, I have spent 25 minutes with the patient with telehealth technology discussing coronary disease, hypertension, dyslipidemia, OSA, palpitations and COVID-19.     Medication Adjustments/Labs and Tests Ordered: Current medicines are reviewed at length with the patient today.  Concerns regarding medicines are outlined above.   Tests Ordered: No orders of the defined types were placed in this encounter.   Medication Changes: No orders of the defined types were placed in this encounter.   Disposition:  in 1 year(s)  [name], MD, [facility], [place] Director of the Advanced Lipid Disorders &  Cardiovascular Risk Reduction Clinic Diplomate of the American Board of Clinical Lipidology Attending Cardiologist  Direct Dial: [phone]   Fax: [phone]  Website:  [email or web address]  [name], MD  07/19/2019 9:32 AM

## 2019-08-09 DIAGNOSIS — M25561 Pain in right knee: Secondary | ICD-10-CM | POA: Diagnosis not present

## 2019-08-09 DIAGNOSIS — M25512 Pain in left shoulder: Secondary | ICD-10-CM | POA: Diagnosis not present

## 2019-08-10 ENCOUNTER — Telehealth: Payer: Self-pay | Admitting: Internal Medicine

## 2019-08-10 DIAGNOSIS — I10 Essential (primary) hypertension: Secondary | ICD-10-CM

## 2019-08-10 MED ORDER — AMLODIPINE BESYLATE 5 MG PO TABS: 5.0000 mg | ORAL_TABLET | Freq: Every day | ORAL | 1 refills | Status: DC

## 2019-08-10 NOTE — Telephone Encounter (Signed)
Patient informed and verbalized understanding of plan. 

## 2019-08-10 NOTE — Telephone Encounter (Signed)
Nurse visit for BP not available. Will send to DOD for suggestion.

## 2019-08-10 NOTE — Telephone Encounter (Signed)
I would recommend starting her on amlodipine 5 mg daily. Already on metoprolol and losartan. Make follow up in hypertension clinic in 2-3 weeks  Keyasha Miah Martinique MD, [facility]

## 2019-08-10 NOTE — Telephone Encounter (Signed)
Reports BP being elevated for 3 weeks. Reports after recent video visit, her BP started increasing. Reports accidentally erasing BP numbers. Please see available BP readings below. HR now 66 and O2 Sat 98. Medications reviewed. Reports compliance with BP medications. Also reports more fatigue & lightheadedness, Reports chest pain that she's had for a while, that is better than last visit. Reports blurred vision when she gets out of bed until she gets focused. Reports sharp headaches for awhile and she takes aleve that improves the pain. Reports having a lot of joint pian esp knees all the time. Denies active chest pain or sob. Advised that she needed a nurse visit for a BP check. Will contact once this is set up.

## 2019-08-10 NOTE — Telephone Encounter (Signed)
°  Pt c/o BP issue: STAT if pt c/o blurred vision, one-sided weakness or slurred speech  1. What are your last 5 BP readings?   11:30 am 09/18  185/100 11:15 pm 09/17  177/83  3:00 pm 09/17    213/100  2. Are you having any other symptoms (ex. Dizziness, headache, blurred vision, passed out)? occasional headache, lightheadedness, fatigue, sleeping more than she is awake.   3. What is your BP issue? High BP. Pt feels her medicine is not doing what it is supposed to do.   Pt went to Urgent Care about two weeks ago because of her BP. Her BP was high, so  she was sent to the ER. When she went to the ER, she did not even get any of her medication, and all she did was sit in the waiting room and nothing was done.  She does not want to go back to the ER especially if nothing can be done.    She really is just wondering if her medicine needs to be adjusted

## 2019-09-04 ENCOUNTER — Ambulatory Visit (INDEPENDENT_AMBULATORY_CARE_PROVIDER_SITE_OTHER): Payer: Medicare Other | Admitting: Pharmacist

## 2019-09-04 ENCOUNTER — Other Ambulatory Visit: Payer: Self-pay

## 2019-09-04 VITALS — BP 130/62 | HR 64 | Ht 68.0 in | Wt 312.8 lb

## 2019-09-04 DIAGNOSIS — I10 Essential (primary) hypertension: Secondary | ICD-10-CM | POA: Diagnosis not present

## 2019-09-04 NOTE — Progress Notes (Signed)
Patient ID: [id number]                 DOB: 24-Mar-1956                      MRN: [id number]     HPI: [name] is a 63 y.o. female referred by Dr. Debara Pickett to HTN clinic. PMH relevant for CAD s/p CABG, carotid disease, HTN, iron deficiency, sleep apnea not on CPAP. Patient reports resolution of dizziness, headaches, swelling and fatigue. She started a vegan diet 3 weeks ago and stated to noticed improvement on her health 2 weeks after changing eating habits.  Current HTN meds:  Amlodipine 5mg  daily Losartan 100mg  daily Metoprolol tartrate 75mg  twice daily  Previously tried:  Lisinopril -cough  BP goal: 130/80  Family History: includes MI, DM and HTN in mother, HF and DM in sister, HTN in daughter  Social History: reports that she has never smoked. She has never used smokeless tobacco. She reports that she does not drink alcohol or use drugs.  Diet: changed to vegan diet (2 weeks); hibiscus tea, fresh fruits, eliminated all caffeine and sodas from diet  Exercise: activities of daily living  Home BP readings:  117/64 at bedtime per patient report (no records available)  Wt Readings from Last 3 Encounters:  09/04/19 (!) 312 lb 12.8 oz (141.9 kg)  07/19/19 (!) 311 lb 9.6 oz (141.3 kg)  07/09/19 (!) 317 lb 8 oz (144 kg)   BP Readings from Last 3 Encounters:  09/04/19 130/62  07/19/19 (!) 147/67  07/13/19 (!) 165/80   Pulse Readings from Last 3 Encounters:  09/04/19 64  07/19/19 68  07/13/19 70    Past Medical History:  Diagnosis Date  . Acute urinary retention s/p Foley 01/07/2012  . CAD (coronary artery disease)    a. 12/2013 s/p CABG x 5 Dr. Darcey Nora (LIMA to LAD, SVG to diagonal, SVG to OM1, SVG to OM2, SVG to PDA)  . Carotid arterial disease ([place])    a. 12/2013 Carotid U/S: 1-39% bilat ICA stenosis.  . Carpal tunnel syndrome, bilateral   . Hemorrhoids, internal, with bleeding & prolapse 12/13/2011  . Hernia, abdominal   . History of blood transfusion    CABG and  hysterectomy  . History of kidney stones   . Hypertension   . Iron deficiency 12-07-2011  . Keloid scar    a. Sternal Keloid s/p CABG with ongoing pain.  . Morbid obesity ([place])   . Myocardial infarct ([place])   . Neuropathy   . Osteoarthritis   . Osteoporosis   . Seasonal allergies   . Sleep apnea    no CPAP machine; sleep study 02/2010 REM AHI 61.7/hr, total sleep REM 14.8/hr  . Uterine cancer ([place]) 7/15   clinical stage IA grade 1 endometrioid endometrial cancer    Current Outpatient Medications on File Prior to Visit  Medication Sig Dispense Refill  . acetaminophen (TYLENOL) 500 MG tablet Take 1,000 mg by mouth every 8 (eight) hours as needed for mild pain.     Marland Kitchen albuterol (PROVENTIL HFA;VENTOLIN HFA) 108 (90 Base) MCG/ACT inhaler Inhale 2 puffs into the lungs every 6 (six) hours as needed for wheezing or shortness of breath. 1 Inhaler 0  . amLODipine (NORVASC) 5 MG tablet Take 1 tablet (5 mg total) by mouth daily. 90 tablet 1  . aspirin EC 81 MG tablet Take 81 mg by mouth daily.    . diclofenac sodium (VOLTAREN) 1 %  GEL Apply 2 g topically 4 (four) times daily. (Patient taking differently: Apply 2 g topically 4 (four) times daily as needed (for pain). ) 100 g 1  . esomeprazole (NEXIUM) 40 MG capsule TAKE 1 CAPSULE BY MOUTH DAILY AT 12 NOON. 90 capsule 3  . fluticasone (FLONASE) 50 MCG/ACT nasal spray PLACE 1 SPRAY INTO BOTH NOSTRILS DAILY. (Patient taking differently: Place 1 spray into both nostrils daily as needed for allergies. ) 16 mL 2  . losartan (COZAAR) 100 MG tablet TAKE 1 TABLET BY MOUTH EVERY DAY 90 tablet 3  . metoprolol tartrate (LOPRESSOR) 25 MG tablet TAKE 1 AND 1/2 TABLETS BY MOUTH TWICE A DAY 270 tablet 0  . Multiple Vitamin (MULTIVITAMIN WITH MINERALS) TABS tablet Take 1 tablet by mouth See admin instructions. Take one tablet by mouth on Monday, Wednesday and Fridays    . naproxen sodium (ALEVE) 220 MG tablet Take 220 mg by mouth 2 (two) times daily as needed.    Vladimir Faster Glycol-Propyl Glycol (SYSTANE FREE OP) Apply 1 tablet to eye daily as needed (For dry eyes).    . rosuvastatin (CRESTOR) 20 MG tablet TAKE 1 TABLET BY MOUTH EVERY DAY 90 tablet 1   No current facility-administered medications on file prior to visit.     Allergies  Allergen Reactions  . Cephalosporins Anaphylaxis, Swelling and Other (See Comments)    Tongue swelling, gum pain  . Eicosapentaenoic Acid (Epa) Anaphylaxis and Shortness Of Breath  . Fish-Derived Products Anaphylaxis and Shortness Of Breath  . Peanuts [Peanut Oil] Anaphylaxis, Shortness Of Breath and Other (See Comments)    Peanut butter  . Shrimp [Shellfish Allergy] Anaphylaxis    Pt states her throat will swell if she eats shrimp.  . Latex Itching  . Metronidazole Other (See Comments)    Palpitations, mild SOB, metallic taste, dry mouth, high blood pressure  . Ciprofloxacin Other (See Comments)    Gaging and achy  . Lisinopril Cough       . Naproxen Nausea Only and Other (See Comments)    Headache  . Other Itching and Other (See Comments)     All pain meds make her itch - has to have something to prevent that in addition to receiving med  . Prednisone Other (See Comments)    Heart beating fast       Blood pressure 130/62, pulse 64, height 5\' 8"  (1.727 m), weight (!) 312 lb 12.8 oz (141.9 kg), last menstrual period 01/13/2014, SpO2 98 %.  Essential hypertension Blood pressure greatly improved with vegan diet. Patient reports compliance with current medication and plans to continue vegan diet.  Will continue all therapy without changes and schedule follow up HTN visit in 2 months. Patient was instructed to monitor BP twice daily and bring records to next follow up appointment.   Arm pain is reproducible by touch and possibly related to previously diagnosed carpal tunnel syndrome. Noted troponin and ECG done during recent visit to ER for arm & chest pain also r/o cardiac reason. Patient instructed to  contact PCP as soon as possible to assessment and pain management.   Felicie Kocher Rodriguez-Guzman PharmD, BCPS, [place] [address] 09/06/2019 12:51 PM

## 2019-09-04 NOTE — Patient Instructions (Addendum)
Return for a  follow up appointment in 2 months  Check your blood pressure at home daily (if able) and keep record of the readings.  Take your BP meds as follows: *NO MEDICATION CHANGE* *PRIMARY CARE PHYSICIAN for arm pain*  Bring all of your meds, your BP cuff and your record of home blood pressures to your next appointment.  Exercise as you're able, try to walk approximately 30 minutes per day.  Keep salt intake to a minimum, especially watch canned and prepared boxed foods.  Eat more fresh fruits and vegetables and fewer canned items.  Avoid eating in fast food restaurants.    HOW TO TAKE YOUR BLOOD PRESSURE: . Rest 5 minutes before taking your blood pressure. .  Don't smoke or drink caffeinated beverages for at least 30 minutes before. . Take your blood pressure before (not after) you eat. . Sit comfortably with your back supported and both feet on the floor (don't cross your legs). . Elevate your arm to heart level on a table or a desk. . Use the proper sized cuff. It should fit smoothly and snugly around your bare upper arm. There should be enough room to slip a fingertip under the cuff. The bottom edge of the cuff should be 1 inch above the crease of the elbow. . Ideally, take 3 measurements at one sitting and record the average.

## 2019-09-06 ENCOUNTER — Encounter: Payer: Self-pay | Admitting: Pharmacist

## 2019-09-06 NOTE — Assessment & Plan Note (Signed)
Blood pressure greatly improved with vegan diet. Patient reports compliance with current medication and plans to continue vegan diet.  Will continue all therapy without changes and schedule follow up HTN visit in 2 months. Patient was instructed to monitor BP twice daily and bring records to next follow up appointment.   Arm pain is reproducible by touch and possibly related to previously diagnosed carpal tunnel syndrome. Noted troponin and ECG done during recent visit to ER for arm & chest pain also r/o cardiac reason. Patient instructed to contact PCP as soon as possible to assessment and pain management.

## 2019-09-15 ENCOUNTER — Other Ambulatory Visit: Payer: Self-pay | Admitting: Student in an Organized Health Care Education/Training Program

## 2019-09-18 ENCOUNTER — Other Ambulatory Visit: Payer: Self-pay | Admitting: Internal Medicine

## 2019-10-08 ENCOUNTER — Ambulatory Visit (INDEPENDENT_AMBULATORY_CARE_PROVIDER_SITE_OTHER): Payer: Medicare Other | Admitting: Student in an Organized Health Care Education/Training Program

## 2019-10-08 ENCOUNTER — Encounter: Payer: Self-pay | Admitting: Student in an Organized Health Care Education/Training Program

## 2019-10-08 ENCOUNTER — Other Ambulatory Visit: Payer: Self-pay

## 2019-10-08 VITALS — BP 144/67 | HR 65 | Temp 99.2°F | Ht 68.0 in | Wt 307.6 lb

## 2019-10-08 DIAGNOSIS — M1711 Unilateral primary osteoarthritis, right knee: Secondary | ICD-10-CM

## 2019-10-08 DIAGNOSIS — Z79899 Other long term (current) drug therapy: Secondary | ICD-10-CM

## 2019-10-08 DIAGNOSIS — Z6841 Body Mass Index (BMI) 40.0 and over, adult: Secondary | ICD-10-CM

## 2019-10-08 DIAGNOSIS — I2510 Atherosclerotic heart disease of native coronary artery without angina pectoris: Secondary | ICD-10-CM

## 2019-10-08 DIAGNOSIS — B372 Candidiasis of skin and nail: Secondary | ICD-10-CM | POA: Diagnosis not present

## 2019-10-08 DIAGNOSIS — I10 Essential (primary) hypertension: Secondary | ICD-10-CM | POA: Diagnosis not present

## 2019-10-08 DIAGNOSIS — K219 Gastro-esophageal reflux disease without esophagitis: Secondary | ICD-10-CM | POA: Diagnosis not present

## 2019-10-08 DIAGNOSIS — Z951 Presence of aortocoronary bypass graft: Secondary | ICD-10-CM

## 2019-10-08 DIAGNOSIS — M7542 Impingement syndrome of left shoulder: Secondary | ICD-10-CM

## 2019-10-08 MED ORDER — NYSTATIN 100000 UNIT/GM EX CREA: 1.0000 "application " | TOPICAL_CREAM | Freq: Every day | CUTANEOUS | 1 refills | Status: DC | PRN

## 2019-10-08 NOTE — Assessment & Plan Note (Signed)
Blood pressure control is good today.  About 2 months ago she was started on amlodipine and has tolerated it well.  Plan to continue with losartan 100 mg daily, metoprolol 25 mg twice daily, and amlodipine 5 mg daily.

## 2019-10-08 NOTE — Assessment & Plan Note (Signed)
Reflux symptoms are much improved.  Seems like she has had much fewer symptoms since transitioning to a vegan diet.  She is going to trial stopping Nexium and see if she has any worsening of her stable symptoms.

## 2019-10-08 NOTE — Assessment & Plan Note (Signed)
Patient has lost 10 pounds in the last 3 months.  She transitioned to a vegan diet which is working very well for her.  She is limited in her exercise capacity due to osteoarthritis of the right knee and impingement syndrome of the left shoulder.  I think she is doing a great job with nutrition interventions.

## 2019-10-08 NOTE — Assessment & Plan Note (Signed)
Moderate candidiasis at the lower abdomen intertrigo.  Plan is for nystatin cream to be used once daily.  She is doing a good job on Nurse, mental health and keeping the area clean and dry.

## 2019-10-08 NOTE — Patient Instructions (Signed)
Is great seeing you today in the clinic.  We talked about your high blood pressure which is under good control.  Please continue taking the medications as prescribed including amlodipine once daily.  We talked about your nutrition, it is great that you have made these changes.  I gave you some more ideas about foods that you can eat at home which will help you.  We talked about the rash in your lower abdomen.  I am prescribing a cream that you can use once daily to help with the irritation.

## 2019-10-08 NOTE — Progress Notes (Signed)
   Assessment and Plan:  See Encounters tab for problem-based medical decision making.   __________________________________________________________  HPI:   63 year old woman here for follow-up today of hypertension and obesity.  Patient reports doing very well since I last saw her.  She is lost another 5 pounds in the last 1 month, this makes about 10 pounds in the last 3 months.  She is doing this with nutrition, she recently transitioned to a less vegan diet.  She reports doing really well with that change.  She says she has more energy, is having less reflux symptoms, reports that her blood pressure is doing better as well.  She is limited in her exercise capacity due to osteoarthritis of the right knee with recurrent effusion and impingement syndrome of the left shoulder.  She has sought opinion at orthopedics, currently she is too overweight for knee replacement surgery, they are considering shoulder replacement surgery in the future.  She has a history of ischemic heart disease status post bypass surgery.  She follows with the cardiology clinic for that.  She called in for some left shoulder pain and hypertension about a month ago, they felt that this pain was very low risk for cardiac in origin.  They prescribed her amlodipine for her hypertension and she has tolerated that medication well.  She is doing well with COVID-19 precautions, good physical distancing, she wears double masks and does grocery shopping approved but has not ground with family is all happy and healthy she has had no significant Covid exposures  __________________________________________________________  Problem List: Patient Active Problem List   Diagnosis Date Noted  . Atherosclerosis of aorta ([place]) 06/20/2015    Priority: High  . Coronary atherosclerosis of native coronary artery 01/05/2014    Priority: High  . Osteoarthritis of right knee 11/07/2014    Priority: Medium  . Morbid obesity due to excess  calories ([place]) 05/10/2012    Priority: Medium  . Obstructive sleep apnea 03/27/2010    Priority: Medium  . Essential hypertension 06/30/2007    Priority: Medium  . Intertriginous candidiasis 10/08/2019    Priority: Low  . Depression 10/16/2018    Priority: Low  . Osteoarthritis of left shoulder 10/24/2017    Priority: Low  . Peripheral neuropathy 08/19/2017    Priority: Low  . Health care maintenance 10/09/2014    Priority: Low  . GERD 08/06/2008    Priority: Low  . Hyperlipidemia 10/31/2007    Priority: Low    Medications: Reconciled today in Epic __________________________________________________________  Physical Exam:  Vital Signs: Vitals:   10/08/19 1011  BP: (!) 144/67  Pulse: 65  Temp: 99.2 F (37.3 C)  TempSrc: Oral  SpO2: 100%  Weight: (!) 307 lb 9.6 oz (139.5 kg)  Height: 5\' 8"  (1.727 m)    Gen: Well appearing, NAD Neck: No cervical LAD, No thyromegaly or nodules CV: RRR, no murmurs Pulm: Normal effort, CTA throughout, no wheezing Abd: Soft, NT, ND  Ext: Warm, no edema, normal joints Skin: No atypical appearing moles. There is some moderate hyperpigmentation of the lower abdomen in the intertriginous distribution, no vesicles, no excoriations, no satellite lesions, no ulcerations

## 2019-10-11 ENCOUNTER — Other Ambulatory Visit: Payer: Self-pay

## 2019-10-11 NOTE — Patient Outreach (Signed)
[facility]) Care Management  10/11/2019  [name] [birth year] [id number]   Medication Adherence call to [name] Compliant Voice message left with a call back number. [name] is showing past due on Rosuvastatin 20 mg under [place].   [place] Management Direct Dial [phone]  Fax [phone] [name]

## 2019-10-31 ENCOUNTER — Ambulatory Visit: Payer: Medicare Other

## 2019-10-31 ENCOUNTER — Telehealth: Payer: Self-pay | Admitting: Student in an Organized Health Care Education/Training Program

## 2019-10-31 NOTE — Telephone Encounter (Signed)
Refill Request losartan (COZAAR) 100 MG tablet  [id number] - [place], [place] [address]

## 2019-10-31 NOTE — Telephone Encounter (Signed)
Pt has refills at pharm, picked up 90 11/12, pt states she must have thrown them away, she cannot find them anywhere, pharm told her it would be 56.00 to replace. Triage ask pharm, alan, to ask insurance for override. If not am checking with dr Maudie Mercury for samples since pt cannot afford. Cannot do sample per dr Maudie Mercury, will do goodrx if needed

## 2019-11-01 MED ORDER — LOSARTAN POTASSIUM 100 MG PO TABS: 100.0000 mg | ORAL_TABLET | Freq: Every day | ORAL | 3 refills | Status: DC

## 2019-11-01 NOTE — Telephone Encounter (Signed)
Ok. Refill approved. I hope they can work something out regarding the cost.

## 2019-11-01 NOTE — Telephone Encounter (Signed)
They were replaced

## 2019-11-08 ENCOUNTER — Encounter (INDEPENDENT_AMBULATORY_CARE_PROVIDER_SITE_OTHER): Payer: Self-pay

## 2019-11-08 ENCOUNTER — Ambulatory Visit (INDEPENDENT_AMBULATORY_CARE_PROVIDER_SITE_OTHER): Payer: Medicare Other | Admitting: Pharmacist Clinician (PhC)/ Clinical Pharmacy Specialist

## 2019-11-08 ENCOUNTER — Other Ambulatory Visit: Payer: Self-pay

## 2019-11-08 DIAGNOSIS — I10 Essential (primary) hypertension: Secondary | ICD-10-CM | POA: Diagnosis not present

## 2019-11-08 NOTE — Progress Notes (Signed)
Patient ID: [name]                 DOB: 03-25-1956                      MRN: [id number]     HPI: [name] is a 63 y.o. female referred by Dr. Debara Pickett to HTN clinic. PMH relevant for CAD s/p CABG, carotid disease, HTN, iron deficiency, sleep apnea not on CPAP. Patient reports resolution of dizziness, headaches, swelling and fatigue. She started a vegan diet 3 weeks ago and stated to noticed improvement on her health 2 weeks after changing eating habits.  When she was last seen in [facility] her BP was good at 130/62.  Because she had just switched to a vegan diet, no changes were made in her medications, but she was asked to follow up after 2 months.    Today she returns for follow up.  Since October she has been doing well.  She continues with her vegan diet, and is very happy.  She has lost some weight and notes that her stomach does not sit on her lap any more!  She feels as though she has more energy and was very excited to tell me about some of her new recipes.    Current HTN meds:  Amlodipine 5mg  daily Losartan 100mg  daily Metoprolol tartrate 75mg  twice daily  Previously tried:  Lisinopril -cough  BP goal: 130/80  Family History: includes MI, DM and HTN in mother, HF and DM in sister, HTN in daughter  Social History: reports that she has never smoked. She has never used smokeless tobacco. She reports that she does not drink alcohol or use drugs.  Diet: changed to vegan diet (2 months now); hibiscus tea, fresh fruits, eliminated all caffeine and sodas from diet; getting protein from nuts and protein powder that she can add to other foods,   Exercise: activities of daily living  Home BP readings: no home readings with her, but states what she has checked were WNL for most part.    Wt Readings from Last 3 Encounters:  11/08/19 (!) 303 lb 6.4 oz (137.6 kg)  10/08/19 (!) 307 lb 9.6 oz (139.5 kg)  09/04/19 (!) 312 lb 12.8 oz (141.9 kg)   BP Readings from Last 3 Encounters:   11/08/19 124/82  10/08/19 (!) 144/67  09/04/19 130/62   Pulse Readings from Last 3 Encounters:  11/08/19 70  10/08/19 65  09/04/19 64    Past Medical History:  Diagnosis Date  . Acute urinary retention s/p Foley 01/07/2012  . CAD (coronary artery disease)    a. 12/2013 s/p CABG x 5 Dr. Darcey Nora (LIMA to LAD, SVG to diagonal, SVG to OM1, SVG to OM2, SVG to PDA)  . Carotid arterial disease ([place])    a. 12/2013 Carotid U/S: 1-39% bilat ICA stenosis.  . Carpal tunnel syndrome, bilateral   . Hemorrhoids, internal, with bleeding & prolapse 12/13/2011  . Hernia, abdominal   . History of blood transfusion    CABG and hysterectomy  . History of kidney stones   . Hypertension   . Iron deficiency 12-07-2011  . Keloid scar    a. Sternal Keloid s/p CABG with ongoing pain.  . Morbid obesity ([place])   . Myocardial infarct ([place])   . Neuropathy   . Osteoarthritis   . Osteoporosis   . Seasonal allergies   . Sleep apnea    no CPAP machine; sleep study 02/2010  REM AHI 61.7/hr, total sleep REM 14.8/hr  . Uterine cancer ([place]) 7/15   clinical stage IA grade 1 endometrioid endometrial cancer    Current Outpatient Medications on File Prior to Visit  Medication Sig Dispense Refill  . acetaminophen (TYLENOL) 500 MG tablet Take 1,000 mg by mouth every 8 (eight) hours as needed for mild pain.     Marland Kitchen albuterol (PROVENTIL HFA;VENTOLIN HFA) 108 (90 Base) MCG/ACT inhaler Inhale 2 puffs into the lungs every 6 (six) hours as needed for wheezing or shortness of breath. 1 Inhaler 0  . amLODipine (NORVASC) 5 MG tablet Take 1 tablet (5 mg total) by mouth daily. 90 tablet 1  . aspirin EC 81 MG tablet Take 81 mg by mouth daily.    . diclofenac sodium (VOLTAREN) 1 % GEL Apply 2 g topically 4 (four) times daily. (Patient taking differently: Apply 2 g topically 4 (four) times daily as needed (for pain). ) 100 g 1  . fluticasone (FLONASE) 50 MCG/ACT nasal spray PLACE 1 SPRAY INTO BOTH NOSTRILS DAILY. (Patient taking  differently: Place 1 spray into both nostrils daily as needed for allergies. ) 16 mL 2  . losartan (COZAAR) 100 MG tablet Take 1 tablet (100 mg total) by mouth daily. 90 tablet 3  . metoprolol tartrate (LOPRESSOR) 25 MG tablet TAKE 1 AND 1/2 TABLETS BY MOUTH TWICE A DAY 270 tablet 0  . Multiple Vitamin (MULTIVITAMIN WITH MINERALS) TABS tablet Take 1 tablet by mouth See admin instructions. Take one tablet by mouth on Monday, Wednesday and Fridays    . nystatin cream (MYCOSTATIN) Apply 1 application topically daily as needed (Rash). 30 g 1  . Polyethyl Glycol-Propyl Glycol (SYSTANE FREE OP) Apply 1 tablet to eye daily as needed (For dry eyes).    . rosuvastatin (CRESTOR) 20 MG tablet TAKE 1 TABLET BY MOUTH EVERY DAY 90 tablet 3  . naproxen sodium (ALEVE) 220 MG tablet Take 220 mg by mouth 2 (two) times daily as needed.     No current facility-administered medications on file prior to visit.    Allergies  Allergen Reactions  . Cephalosporins Anaphylaxis, Swelling and Other (See Comments)    Tongue swelling, gum pain  . Eicosapentaenoic Acid (Epa) Anaphylaxis and Shortness Of Breath  . Fish-Derived Products Anaphylaxis and Shortness Of Breath  . Peanuts [Peanut Oil] Anaphylaxis, Shortness Of Breath and Other (See Comments)    Peanut butter  . Shrimp [Shellfish Allergy] Anaphylaxis    Pt states her throat will swell if she eats shrimp.  . Latex Itching  . Metronidazole Other (See Comments)    Palpitations, mild SOB, metallic taste, dry mouth, high blood pressure  . Ciprofloxacin Other (See Comments)    Gaging and achy  . Lisinopril Cough       . Naproxen Nausea Only and Other (See Comments)    Headache  . Other Itching and Other (See Comments)     All pain meds make her itch - has to have something to prevent that in addition to receiving med  . Prednisone Other (See Comments)    Heart beating fast       Blood pressure 124/82, pulse 70, resp. rate 16, height 5\' 8"  (1.727 m),  weight (!) 303 lb 6.4 oz (137.6 kg), last menstrual period 01/13/2014, SpO2 97 %.  Essential hypertension Patient feeling very good since switching to a plant based diet.  She has been slowly loosing some weight and is very excited about her life at the moment.  She was encouraged to continue with her medications, and over time, if her weight continues to drop and her exercise tolerance increases, she may be able to decrease her antihypertensive medications.  I explained that she will probably always need some medication, but all that she can do to promote a healthy lifestyle will go a long way for her.  We can see her back as needed in the future.    Tommy Medal PharmD CPP [facility] [address] [place] [address] 11/09/2019 4:47 PM

## 2019-11-08 NOTE — Patient Instructions (Addendum)
  Your blood pressure today is 124/82  Check your blood pressure at home once daily, several days each week, and keep record of the readings.  Take your BP meds as follows:  Continue with all current medications  Bring all of your meds, your BP cuff and your record of home blood pressures to your next appointment.  Exercise as you're able, try to walk approximately 30 minutes per day.  Keep salt intake to a minimum, especially watch canned and prepared boxed foods.  Eat more fresh fruits and vegetables and fewer canned items.  Avoid eating in fast food restaurants.    HOW TO TAKE YOUR BLOOD PRESSURE: . Rest 5 minutes before taking your blood pressure. .  Don't smoke or drink caffeinated beverages for at least 30 minutes before. . Take your blood pressure before (not after) you eat. . Sit comfortably with your back supported and both feet on the floor (don't cross your legs). . Elevate your arm to heart level on a table or a desk. . Use the proper sized cuff. It should fit smoothly and snugly around your bare upper arm. There should be enough room to slip a fingertip under the cuff. The bottom edge of the cuff should be 1 inch above the crease of the elbow. . Ideally, take 3 measurements at one sitting and record the average.

## 2019-11-09 NOTE — Assessment & Plan Note (Signed)
Patient feeling very good since switching to a plant based diet.  She has been slowly loosing some weight and is very excited about her life at the moment.  She was encouraged to continue with her medications, and over time, if her weight continues to drop and her exercise tolerance increases, she may be able to decrease her antihypertensive medications.  I explained that she will probably always need some medication, but all that she can do to promote a healthy lifestyle will go a long way for her.  We can see her back as needed in the future.

## 2019-12-30 ENCOUNTER — Other Ambulatory Visit: Payer: Self-pay | Admitting: Student in an Organized Health Care Education/Training Program

## 2020-01-24 DIAGNOSIS — H53001 Unspecified amblyopia, right eye: Secondary | ICD-10-CM | POA: Diagnosis not present

## 2020-01-24 DIAGNOSIS — H25813 Combined forms of age-related cataract, bilateral: Secondary | ICD-10-CM | POA: Diagnosis not present

## 2020-01-24 DIAGNOSIS — H10013 Acute follicular conjunctivitis, bilateral: Secondary | ICD-10-CM | POA: Diagnosis not present

## 2020-01-29 DIAGNOSIS — H04123 Dry eye syndrome of bilateral lacrimal glands: Secondary | ICD-10-CM | POA: Diagnosis not present

## 2020-01-29 DIAGNOSIS — H25813 Combined forms of age-related cataract, bilateral: Secondary | ICD-10-CM | POA: Diagnosis not present

## 2020-01-29 DIAGNOSIS — H10013 Acute follicular conjunctivitis, bilateral: Secondary | ICD-10-CM | POA: Diagnosis not present

## 2020-02-03 ENCOUNTER — Other Ambulatory Visit: Payer: Self-pay | Admitting: Cardiology

## 2020-02-06 ENCOUNTER — Telehealth: Payer: Self-pay | Admitting: *Deleted

## 2020-02-06 DIAGNOSIS — H10013 Acute follicular conjunctivitis, bilateral: Secondary | ICD-10-CM | POA: Diagnosis not present

## 2020-02-06 DIAGNOSIS — H43821 Vitreomacular adhesion, right eye: Secondary | ICD-10-CM | POA: Diagnosis not present

## 2020-02-06 DIAGNOSIS — H53021 Refractive amblyopia, right eye: Secondary | ICD-10-CM | POA: Diagnosis not present

## 2020-02-06 DIAGNOSIS — H04123 Dry eye syndrome of bilateral lacrimal glands: Secondary | ICD-10-CM | POA: Diagnosis not present

## 2020-02-06 DIAGNOSIS — H25813 Combined forms of age-related cataract, bilateral: Secondary | ICD-10-CM | POA: Diagnosis not present

## 2020-02-06 NOTE — Telephone Encounter (Signed)
   [facility] Pre-operative Risk Assessment    Request for surgical clearance:  1. What type of surgery is being performed? Cataract extraction OD   2. When is this surgery scheduled? 03/06/20   3. What type of clearance is required (medical clearance vs. Pharmacy clearance to hold med vs. Both)? medical  4. Are there any medications that need to be held prior to surgery and how long? No   5. Practice name and name of physician performing surgery? [facility]    6. What is your office phone number [phone]    7.   What is your office fax number [phone]  8.   Anesthesia type (None, local, MAC, general) ?  MAC   [name] 02/06/2020, 5:02 PM  _________________________________________________________________

## 2020-02-07 NOTE — Telephone Encounter (Signed)
   Primary Cardiologist: [name], MD  Chart reviewed as part of pre-operative protocol coverage. Cataract extractions are recognized in guidelines as low risk surgeries that do not typically require specific preoperative testing or holding of blood thinner therapy. Therefore, given past medical history and time since last visit, based on ACC/AHA guidelines, [name] would be at acceptable risk for the planned procedure without further cardiovascular testing.   I will route this recommendation to the requesting party via Epic fax function and remove from pre-op pool.  Please call with questions.  [place], [place] 02/07/2020, 9:10 AM

## 2020-03-06 DIAGNOSIS — H2511 Age-related nuclear cataract, right eye: Secondary | ICD-10-CM | POA: Diagnosis not present

## 2020-04-02 ENCOUNTER — Encounter: Payer: Self-pay | Admitting: *Deleted

## 2020-04-02 NOTE — Progress Notes (Signed)

## 2020-04-03 NOTE — Progress Notes (Signed)
Things That May Be Affecting Your Health:  Alcohol  Hearing loss  Pain    Depression  Home Safety  Sexual Health   Diabetes Y Lack of physical activity  Stress   Difficulty with daily activities  Loneliness  Tiredness   Drug use  Medicines  Tobacco use   Falls  Motor Vehicle Safety Y Weight   Food choices  Oral Health  Other    YOUR PERSONALIZED HEALTH PLAN : 1. Schedule your next subsequent Medicare Wellness visit in one year 2. Attend all of your regular appointments to address your medical issues 3. Complete the preventative screenings and services   Annual Wellness Visit   Medicare Covered Preventative Screenings and [place] Men and Women Who How Often Need? Date of Last Service Action  Abdominal Aortic Aneurysm Adults with AAA risk factors Once     Alcohol Misuse and Counseling All Adults Screening once a year if no alcohol misuse. Counseling up to 4 face to face sessions.     Bone Density Measurement  Adults at risk for osteoporosis Once every 2 yrs     Lipid Panel Z13.6 All adults without CV disease Once every 5 yrs     Colorectal Cancer   Stool sample or  Colonoscopy All adults 36 and older   Once every year  Every 10 years  Y  2018  I think will be due July 2021, needed 3 year follow up per chart  Depression All Adults Once a year  Today   Diabetes Screening Blood glucose, post glucose load, or GTT Z13.1  All adults at risk  Pre-diabetics  Once per year  Twice per year     Diabetes  Self-Management Training All adults Diabetics 10 hrs first year; 2 hours subsequent years. Requires Copay     Glaucoma  Diabetics  Family history of glaucoma  African Americans 26 yrs +  Hispanic Americans 77 yrs + Annually - requires coppay     Hepatitis C Z72.89 or F19.20  High Risk for HCV  Born between 1945 and 1965  Annually  Once     HIV Z11.4 All adults based on risk  Annually btw ages 8 & 85 regardless of risk  Annually > 65 yrs  if at increased risk     Lung Cancer Screening Asymptomatic adults aged 50-77 with 30 pack yr history and current smoker OR quit within the last 15 yrs Annually Must have counseling and shared decision making documentation before first screen     Medical Nutrition Therapy Adults with   Diabetes  Renal disease  Kidney transplant within past 3 yrs 3 hours first year; 2 hours subsequent years     Obesity and Counseling All adults Screening once a year Counseling if BMI 30 or higher  Y Today   Tobacco Use Counseling Adults who use tobacco  Up to 8 visits in one year     Vaccines Z23  Hepatitis B  Influenza   Pneumonia  Adults   Once  Once every flu season  Two different vaccines separated by one year     Next Annual Wellness Visit People with Medicare Every year  Today     Services & Screenings Women Who How Often Need  Date of Last Service Action  Mammogram  Z12.31 Women over 23 One baseline ages 7-39. Annually ager 56 yrs+  Y    Pap tests All women Annually if high risk. Every 2 yrs for normal risk women  Screening for cervical cancer with   Pap (Z01.419 nl or Z01.411abnl) &  HPV Z11.51 Women aged 6 to 7 Once every 5 yrs     Screening pelvic and breast exams All women Annually if high risk. Every 2 yrs for normal risk women     Sexually Transmitted Diseases  Chlamydia  Gonorrhea  Syphilis All at risk adults Annually for non pregnant females at increased risk         [place] Men Who How Ofter Need  Date of Last Service Action  Prostate Cancer - DRE & PSA Men over 50 Annually.  DRE might require a copay.     Sexually Transmitted Diseases  Syphilis All at risk adults Annually for men at increased risk

## 2020-04-07 ENCOUNTER — Ambulatory Visit (INDEPENDENT_AMBULATORY_CARE_PROVIDER_SITE_OTHER): Payer: Medicare Other | Admitting: Internal Medicine

## 2020-04-07 ENCOUNTER — Encounter: Payer: Self-pay | Admitting: Internal Medicine

## 2020-04-07 ENCOUNTER — Ambulatory Visit (INDEPENDENT_AMBULATORY_CARE_PROVIDER_SITE_OTHER): Payer: Medicare Other | Admitting: Student in an Organized Health Care Education/Training Program

## 2020-04-07 ENCOUNTER — Other Ambulatory Visit: Payer: Self-pay

## 2020-04-07 ENCOUNTER — Encounter: Payer: Self-pay | Admitting: Student in an Organized Health Care Education/Training Program

## 2020-04-07 VITALS — BP 160/80 | HR 64 | Ht 68.0 in | Wt 294.6 lb

## 2020-04-07 VITALS — BP 138/65 | HR 69 | Temp 98.3°F | Ht 68.0 in | Wt 295.0 lb

## 2020-04-07 DIAGNOSIS — Z0000 Encounter for general adult medical examination without abnormal findings: Secondary | ICD-10-CM

## 2020-04-07 DIAGNOSIS — I10 Essential (primary) hypertension: Secondary | ICD-10-CM | POA: Diagnosis not present

## 2020-04-07 DIAGNOSIS — Z951 Presence of aortocoronary bypass graft: Secondary | ICD-10-CM | POA: Diagnosis not present

## 2020-04-07 DIAGNOSIS — Z6841 Body Mass Index (BMI) 40.0 and over, adult: Secondary | ICD-10-CM

## 2020-04-07 DIAGNOSIS — E785 Hyperlipidemia, unspecified: Secondary | ICD-10-CM

## 2020-04-07 DIAGNOSIS — M19012 Primary osteoarthritis, left shoulder: Secondary | ICD-10-CM

## 2020-04-07 NOTE — Progress Notes (Signed)
   Assessment and Plan:  See Encounters tab for problem-based medical decision making.   __________________________________________________________  HPI:   64 year old person living with ischemic heart disease here for follow-up of obesity and hypertension.  Doing very well at home.  Weight loss continues to sustain, good adherence to vegan diet which has been very helpful for her.  Has goals for more weight loss in the future.  No recent illnesses.  No hospitalizations.  No fevers or chills.  Reports occasional chest pain, still follows up with cardiology regularly.  Good adherence with her medications without side effects.  Had a slight dizziness after a family event, but no consistent orthostatic dizziness.  Continues to be plagued by left shoulder pain and right knee pain due to advanced arthritis.  In the past has not been a surgical candidate due to obesity.  Reports occasional numbness in her legs, no significant pain.  Has not had the Covid vaccine yet, just has not gotten around to signing up for it, we were able to sign up for this online in our visit.  __________________________________________________________  Problem List: Patient Active Problem List   Diagnosis Date Noted  . Atherosclerosis of aorta ([place]) 06/20/2015    Priority: High  . Coronary atherosclerosis of native coronary artery 01/05/2014    Priority: High  . Osteoarthritis of right knee 11/07/2014    Priority: Medium  . Morbid obesity due to excess calories ([place]) 05/10/2012    Priority: Medium  . Obstructive sleep apnea 03/27/2010    Priority: Medium  . Essential hypertension 06/30/2007    Priority: Medium  . Intertriginous candidiasis 10/08/2019    Priority: Low  . Depression 10/16/2018    Priority: Low  . Osteoarthritis of left shoulder 10/24/2017    Priority: Low  . Peripheral neuropathy 08/19/2017    Priority: Low  . Health care maintenance 10/09/2014    Priority: Low  . GERD 08/06/2008    Priority:  Low  . Hyperlipidemia 10/31/2007    Priority: Low    Medications: Reconciled today in Epic __________________________________________________________  Physical Exam:  Vital Signs: Vitals:   04/07/20 1001  BP: 138/65  Pulse: 69  Temp: 98.3 F (36.8 C)  TempSrc: Oral  SpO2: 99%  Weight: 295 lb (133.8 kg)  Height: 5\' 8"  (1.727 m)    Gen: Well appearing, NAD CV: RRR, no murmurs Pulm: Normal effort, CTA throughout, no wheezing Abd: Soft, NT, ND.  Ext: Warm, no edema

## 2020-04-07 NOTE — Assessment & Plan Note (Signed)
Talked about Covid 19 vaccination.  I signed her up for the vaccine at the [facility] on Thursday.

## 2020-04-07 NOTE — Assessment & Plan Note (Signed)
Blood pressure well controlled today.  Plan to continue with losartan 100 mg, amlodipine 5 mg, and metoprolol 25 mg twice daily.  A little too early for annual labs, will check at next visit.

## 2020-04-07 NOTE — Assessment & Plan Note (Signed)
Patient has had nice success in losing weight over the last year by changing to a vegan diet.  She is down about 20 pounds over 18 months.  She has a goal to lose another 50 pounds over the next 6 months.  We talked about reasonable expectations.  She has worked with our nutritionist and has put those changes in the practice.  We talked about increasing physical activity and exercise, but she is limited by knee and shoulder arthritis right now.

## 2020-04-07 NOTE — Patient Instructions (Signed)
Medication Instructions:  Your physician recommends that you continue on your current medications as directed. Please refer to the Current Medication list given to you today.  *If you need a refill on your cardiac medications before your next appointment, please call your pharmacy*   Follow-Up: At [facility], you and your health needs are our priority.  As part of our continuing mission to provide you with exceptional heart care, we have created designated Provider Care Teams.  These Care Teams include your primary Cardiologist (physician) and Advanced Practice Providers (APPs -  Physician Assistants and Nurse Practitioners) who all work together to provide you with the care you need, when you need it.  We recommend signing up for the patient portal called "MyChart".  Sign up information is provided on this After Visit Summary.  MyChart is used to connect with patients for Virtual Visits (Telemedicine).  Patients are able to view lab/test results, encounter notes, upcoming appointments, etc.  Non-urgent messages can be sent to your provider as well.   To learn more about what you can do with MyChart, go to [email or web address].    Your next appointment:   12 month(s)  The format for your next appointment:   Either In Person or Virtual  Provider:   You may see Kenneth C Hilty, MD or one of the following Advanced Practice Providers on your designated Care Team:    Hao Meng, PA-C  Angela Duke, PA-C or   Krista Kroeger, PA-C    Other Instructions   

## 2020-04-07 NOTE — Assessment & Plan Note (Signed)
Continues to have significant symptoms due to left glenohumeral joint osteoarthritis.  Would be a candidate for reverse total shoulder replacement surgery except for her obesity puts this at risk for infection.  Symptoms do still seem to function limiting.  I advised that she follow-up with orthopedics to consider surgery now that she has lost weight.  Not sure what the weight cut off is to be acceptable risk of the surgery.  Could also try glenohumeral steroid injection for symptomatic relief.

## 2020-04-07 NOTE — Progress Notes (Signed)
OFFICE NOTE  Chief Complaint:  Routine follow-up  Primary Care Physician: Axel Filler, MD  HPI:  [name] is a 64 year old African American obese female presented to 01/01/14 with symptoms of unstable angina. I had seen her once in 2012, but not since then.  She recently had been having chest pain and presented urgently when she became short of breath at home and had chest tightness, which started as right arm pain and right chest pain, then moved substernally. She ultimately underwent cardiac catheterization via right radial artery which demonstrated severe three-vessel CAD with graftable target vessels. Echocardiogram showed good LV function with some left ventricular hypertrophy from her hypertension and there does not appear to be significant valve disease. Carotid dopplers showed a 1-39% bilateral carotid artery stenosis. She underwent a CABG x 5 on 01/09/2014. Left internal mammary artery to LAD, saphenous vein graft to diagonal, sequential saphenous vein graft to OM1 and OM2, saphenous vein graft to posterior descending. She had multiple teeth extractions prior to surgery. She did well post op. She was anemic but did not need transfusion. At discharge she went to SNF for rehab-she did not stay the recommended time frame due to BR not being available when she needed it. She was not given discharge medications when she left SNF. She saw Cecilie Kicks, FNP, in followup for her hospitalization and was restarted on some of her medications.   [name] was seen today in the office for follow-up. At her last office visit I cleared her for surgery and she underwent hysterectomy and has had resolution of her bleeding problems. She is now contemplating right knee surgery and also breast reduction surgery. She's in the interim seen in the hospital for palpitations. She reports those have improved with medication adjustment. She's decreased her Lasix now to every other day and I think she  can probably take it as needed. She denies any chest pain worsening shortness of breath. She is undergoing injections to reduce the size of her keloid scar.  I saw [name] back in the office today. She's been undergoing rehabilitation and really wants to continue it. She's managed to lose some weight and feels better. Recently though she's been having increased blood pressures. Does have ranged up to [id number] systolic. She had a small increase in her lisinopril to 10 mg daily with an improvement however this staff does not want to continue her at rehabilitation until her blood pressure is better controlled. She denies any chest pain or palpitations.  [name] returns today for follow-up. She continues in cardiac rehabilitation and is doing well. At his last office visit I increased her lisinopril to 20 mg daily and her blood pressure is now much better controlled. Overall she is feeling well. She is undergoing cesarean injections of the keloid scar that she has in her bypass site. She is concerned about a dilated varicose vein that she's developed at the inferior margin.  Since I last saw her, she is doing well. She underwent breast reductions surgery, which has helped her CABG scar some, but has failed to lose significant weight. BMI is now over 50. She does get short of breath but not worsening. Blood pressure is well-controlled and cough has gone away after switching from ACE-I to ARB. She reports slightly more palpitations recently. She has episodes of palpitations, worse at night that occur 3-4x a month.  07/01/2016  [name] returns today for follow-up. She reports some left leg swelling. She has  a history of left knee surgery and has had swelling since that time. She occasionally gets some swelling in the right leg which is where her saphenous vein grafts were taken. She still reports numbness and tingling around the vein harvest sites. She has had numbness and tingling of the chest which is  exquisitely tender to palpation although she has a "high pain tolerance". She recently underwent revision of the keloid scar to her median sternotomy. That does appear somewhat improved. She denies any worsening shortness of breath. Weight is stable.  03/21/2017  [name] was seen today in follow-up. She recently saw Ignacia Bayley, NP, for evaluation of chest pain. Given her history of coronary artery disease and prior CABG, he was concerned about recurrent ischemia. He ordered a [employer] which she underwent and it turns out was negative for ischemia. I do think a lot of her pain is related to her anterior chest keloid scar. She's also having problems with her knees which aren't about the importance of weight loss today. Recently she's lost a little bit away however she is still at a BMI around 50. Recently her blood pressure is running somewhat higher and her PCP increased her irbesartan from 150 mg to 300 mg daily.  04/13/2018  [name] was seen today in routine follow-up.  Over the past year she is done well.  She denies any further chest pain.  She had had an episode not long after bypass and underwent repeat stress testing which was negative for ischemia.  She continues to have problems with a keloid scar over her median sternotomy.  She plans to see a dermatologist about this.  Blood pressure is been fairly well controlled.  Unfortunately her weight is not decreased significantly.  She has been having problems with arthritis and uses BC powder.  I recommended discontinuing that.  She is not taking daily aspirin which she should be and will start that.  She also uses Aleve and I recommended using that sparingly.  She should consider Tylenol as an alternative.  She had an EKG today showed sinus bradycardia at 54 with very flat T waves and a questionable long QTC of over 650 ms.  On my evaluation, is very difficult to see where the T wave and.  I am not certain that there is actually QT  prolongation, and she is not actually on medications that would cause that.   04/07/2020  [name] returns today for follow-up.  Overall she seems to be doing well.  She has never arthritic issues and walks with a cane.  She is recently had some weight loss and intends to get down to 225 later this year.  She denies any chest pain or worsening shortness of breath.  Her blood pressure is elevated today however she saw her PCP this morning and it was much better controlled.  She says she switch to a plant-based diet.  PMHx:  Past Medical History:  Diagnosis Date  . Acute urinary retention s/p Foley 01/07/2012  . CAD (coronary artery disease)    a. 12/2013 s/p CABG x 5 Dr. Darcey Nora (LIMA to LAD, SVG to diagonal, SVG to OM1, SVG to OM2, SVG to PDA)  . Carotid arterial disease ([place])    a. 12/2013 Carotid U/S: 1-39% bilat ICA stenosis.  . Carpal tunnel syndrome, bilateral   . Hemorrhoids, internal, with bleeding & prolapse 12/13/2011  . Hernia, abdominal   . History of blood transfusion    CABG and hysterectomy  .  History of kidney stones   . Hypertension   . Iron deficiency 12-07-2011  . Keloid scar    a. Sternal Keloid s/p CABG with ongoing pain.  . Morbid obesity ([place])   . Myocardial infarct ([place])   . Neuropathy   . Osteoarthritis   . Osteoporosis   . Seasonal allergies   . Sleep apnea    no CPAP machine; sleep study 02/2010 REM AHI 61.7/hr, total sleep REM 14.8/hr  . Uterine cancer ([place]) 7/15   clinical stage IA grade 1 endometrioid endometrial cancer    Past Surgical History:  Procedure Laterality Date  . ABDOMINAL HYSTERECTOMY    . BREAST REDUCTION SURGERY Bilateral 10/10/2015   Procedure: BILATERAL MAMMARY REDUCTION  (BREAST) WITH FREE NIPPLE GRAFT;  Surgeon: Irene Limbo, MD;  Location: [place];  Service: Plastics;  Laterality: Bilateral;  . BREAST SURGERY     reduction  . CARDIAC CATHETERIZATION    . COLONOSCOPY  2009  . COLONOSCOPY W/ BIOPSIES AND POLYPECTOMY    .  CORONARY ARTERY BYPASS GRAFT N/A 01/09/2014   Procedure: CORONARY ARTERY BYPASS GRAFTING (CABG) x 5 using left internal mammary artery and right leg greater saphenous vein harvested endoscopically;  Surgeon: Ivin Poot, MD;  Location: [place];  Service: Open Heart Surgery;  Laterality: N/A;  please use bed extenders and breast binder  . INTRAOPERATIVE TRANSESOPHAGEAL ECHOCARDIOGRAM N/A 01/09/2014   Procedure: INTRAOPERATIVE TRANSESOPHAGEAL ECHOCARDIOGRAM;  Surgeon: Ivin Poot, MD;  Location: [place];  Service: Open Heart Surgery;  Laterality: N/A;  . KNEE ARTHROSCOPY  1991  . KNEE SURGERY Bilateral 1999  . LEFT HEART CATHETERIZATION WITH CORONARY ANGIOGRAM N/A 01/04/2014   Procedure: LEFT HEART CATHETERIZATION WITH CORONARY ANGIOGRAM;  Surgeon: Sinclair Grooms, MD;  Location: [facility] CATH LAB;  Service: Cardiovascular;  Laterality: N/A;  . LESION EXCISION WITH COMPLEX REPAIR N/A 05/07/2016   Procedure: COMPLEX REPAIR OF CHEST 16 CM;  Surgeon: Irene Limbo, MD;  Location: [place];  Service: Plastics;  Laterality: N/A;  . MULTIPLE EXTRACTIONS WITH ALVEOLOPLASTY N/A 04/11/2014   Procedure: Extraction of tooth #'s 1,2,8,16 with alveoloplasty, maxillary tuberosity reductions, and gross debridement of remaining teeth.;  Surgeon: Lenn Cal, DDS;  Location: [place];  Service: Oral Surgery;  Laterality: N/A;  . MULTIPLE TOOTH EXTRACTIONS    . NM MYOCAR PERF WALL MOTION  01/2010   dipyridamole myoview - moderate perfusion defect in basal inferoseptal, basal inferior, mid inferoseptal, mid inferior, apical inferior region; EF 56%  . TEE WITHOUT CARDIOVERSION  01/2010   EF 60-65%, small, flat, non-infiltrating, calcified, fixed apical/septal mass  . TOTAL KNEE ARTHROPLASTY Left 04/29/2011  . transanal hemorrhoidal dearterliaization  01/06/12   with external hemorrhoid removal  . UNILATERAL SALPINGECTOMY Right 06/03/2014   Procedure: UNILATERAL SALPINGECTOMY;  Surgeon: Lavonia Drafts, MD;  Location:  [facility];  Service: Gynecology;  Laterality: Right;  Marland Kitchen VAGINAL HYSTERECTOMY N/A 06/03/2014   Procedure: HYSTERECTOMY VAGINAL;  Surgeon: Lavonia Drafts, MD;  Location: [facility];  Service: Gynecology;  Laterality: N/A;  . WISDOM TOOTH EXTRACTION      FAMHx:  Family History  Problem Relation Age of Onset  . Heart attack Mother 58  . Heart disease Mother   . Hypertension Mother   . Diabetes Mother   . Hypertension Sister   . Heart failure Sister   . Heart disease Sister   . Diabetes Sister   . Kidney disease Brother        hypertension  . Hypertension Daughter   .  Colon cancer Neg Hx     SOCHx:   reports that she has never smoked. She has never used smokeless tobacco. She reports that she does not drink alcohol or use drugs.  ALLERGIES:  Allergies  Allergen Reactions  . Cephalosporins Anaphylaxis, Swelling and Other (See Comments)    Tongue swelling, gum pain  . Eicosapentaenoic Acid (Epa) Anaphylaxis and Shortness Of Breath  . Fish-Derived Products Anaphylaxis and Shortness Of Breath  . Peanuts [Peanut Oil] Anaphylaxis, Shortness Of Breath and Other (See Comments)    Peanut butter  . Shrimp [Shellfish Allergy] Anaphylaxis    Pt states her throat will swell if she eats shrimp.  . Latex Itching  . Metronidazole Other (See Comments)    Palpitations, mild SOB, metallic taste, dry mouth, high blood pressure  . Ciprofloxacin Other (See Comments)    Gaging and achy  . Lisinopril Cough       . Naproxen Nausea Only and Other (See Comments)    Headache  . Other Itching and Other (See Comments)     All pain meds make her itch - has to have something to prevent that in addition to receiving med  . Prednisone Other (See Comments)    Heart beating fast       ROS: Pertinent items noted in HPI and remainder of comprehensive ROS otherwise negative.  HOME MEDS: Current Outpatient Medications  Medication Sig Dispense Refill  . acetaminophen (TYLENOL) 500 MG tablet Take  1,000 mg by mouth every 8 (eight) hours as needed for mild pain.     Marland Kitchen albuterol (PROVENTIL HFA;VENTOLIN HFA) 108 (90 Base) MCG/ACT inhaler Inhale 2 puffs into the lungs every 6 (six) hours as needed for wheezing or shortness of breath. 1 Inhaler 0  . amLODipine (NORVASC) 5 MG tablet TAKE 1 TABLET BY MOUTH EVERY DAY 90 tablet 1  . aspirin EC 81 MG tablet Take 81 mg by mouth daily.    . diclofenac sodium (VOLTAREN) 1 % GEL Apply 2 g topically 4 (four) times daily. (Patient taking differently: Apply 2 g topically 4 (four) times daily as needed (for pain). ) 100 g 1  . fluticasone (FLONASE) 50 MCG/ACT nasal spray PLACE 1 SPRAY INTO BOTH NOSTRILS DAILY. (Patient taking differently: Place 1 spray into both nostrils daily as needed for allergies. ) 16 mL 2  . losartan (COZAAR) 100 MG tablet Take 1 tablet (100 mg total) by mouth daily. 90 tablet 3  . metoprolol tartrate (LOPRESSOR) 25 MG tablet TAKE 1 AND 1/2 TABLETS BY MOUTH TWICE A DAY 270 tablet 0  . Multiple Vitamin (MULTIVITAMIN WITH MINERALS) TABS tablet Take 1 tablet by mouth See admin instructions. Take one tablet by mouth on Monday, Wednesday and Fridays    . naproxen sodium (ALEVE) 220 MG tablet Take 220 mg by mouth 2 (two) times daily as needed.    . nystatin cream (MYCOSTATIN) Apply 1 application topically daily as needed (Rash). 30 g 1  . Polyethyl Glycol-Propyl Glycol (SYSTANE FREE OP) Apply 1 tablet to eye daily as needed (For dry eyes).    . rosuvastatin (CRESTOR) 20 MG tablet TAKE 1 TABLET BY MOUTH EVERY DAY 90 tablet 3   No current facility-administered medications for this visit.    LABS/IMAGING: No results found for this or any previous visit (from the past 48 hour(s)). No results found.  VITALS: BP (!) 160/80   Pulse 64   Ht 5\' 8"  (1.727 m)   Wt 294 lb 9.6 oz (133.6 kg)  LMP 01/13/2014 Comment: spotting in Feb and bleeding since April  BMI 44.79 kg/m   EXAM: General appearance: alert and no distress Neck: no carotid  bruit and no JVD Lungs: clear to auscultation bilaterally Heart: regular rate and rhythm, S1, S2 normal, no murmur, click, rub or gallop Abdomen: soft, non-tender; bowel sounds normal; no masses,  no organomegaly and morbidly obese Extremities: extremities normal, atraumatic, no cyanosis or edema Pulses: 2+ and symmetric Skin: Keloid scar of the midsternal incision site with a dilated varicose vein at the distal aspect Neurologic: Grossly normal  EKG: Normal sinus rhythm 64, nonspecific T wave changes-personally reviewed  ASSESSMENT: 1. Coronary artery disease status post 5 vessel CABG (2015) -low risk Myoview 02/2017 2. Morbid obesity 3. Hypertension - uncontrolled 4. Dyslipidemia 5. OSA, not on cPAP 6. Keloid scar - s/p breast reduction, possible upcoming surgery for scar revision 7. Palpitations 8. Leg edema 9. ?  Prolonged QTC-review suggests this is likely not prolonged due to flattening of the T wave  PLAN: 1.   Ms. Griffin seems to be doing well.  She is a little slow to get around due to orthopedic issues and using a cane.  I did provide a 57-month handicap pass for her at her core class today.  She denies any anginal chest pain.  She has managed to lose some weight after switching to a plant-based diet.  I encouraged her to stick with that.  Follow-up with me annually or sooner as necessary.  [name], MD, [facility], [place] Director of the Advanced Lipid Disorders &  Cardiovascular Risk Reduction Clinic Diplomate of the American Board of Clinical Lipidology Attending Cardiologist  Direct Dial: [phone]  Fax: [phone]  Website:  [email or web address] 04/07/2020, 4:17 PM

## 2020-04-10 ENCOUNTER — Ambulatory Visit: Payer: Medicare Other | Attending: Internal Medicine

## 2020-04-10 DIAGNOSIS — Z23 Encounter for immunization: Secondary | ICD-10-CM

## 2020-04-10 NOTE — Progress Notes (Signed)
   Covid-19 Vaccination Clinic  Name:  [name]    MRN: [id number] DOB: Apr 19, 1956  04/10/2020  [name] was observed post Covid-19 immunization for 15 minutes without incident. She was provided with Vaccine Information Sheet and instruction to access the V-Safe system.   [name] was instructed to call 911 with any severe reactions post vaccine: Marland Kitchen Difficulty breathing  . Swelling of face and throat  . A fast heartbeat  . A bad rash all over body  . Dizziness and weakness   Immunizations Administered    Name Date Dose VIS Date Route   Pfizer COVID-19 Vaccine 04/10/2020 11:15 AM 0.3 mL 01/16/2019 Intramuscular   Manufacturer: [employer], [employer]   Lot: [id number]   [place]: [id number]

## 2020-05-05 ENCOUNTER — Ambulatory Visit: Payer: Medicare Other | Attending: Internal Medicine

## 2020-05-05 DIAGNOSIS — Z23 Encounter for immunization: Secondary | ICD-10-CM

## 2020-05-05 NOTE — Progress Notes (Signed)
   Covid-19 Vaccination Clinic  Name:  [name]    MRN: [id number] DOB: 02-17-1956  05/05/2020  [name] was observed post Covid-19 immunization for 30 minutes based on pre-vaccination screening without incident. She was provided with Vaccine Information Sheet and instruction to access the V-Safe system.   [name] was instructed to call 911 with any severe reactions post vaccine: Marland Kitchen Difficulty breathing  . Swelling of face and throat  . A fast heartbeat  . A bad rash all over body  . Dizziness and weakness   Immunizations Administered    Name Date Dose VIS Date Route   Pfizer COVID-19 Vaccine 05/05/2020 11:03 AM 0.3 mL 01/16/2019 Intramuscular   Manufacturer: [employer], [employer]   Lot: [id number]   [place]: [id number]

## 2020-06-03 ENCOUNTER — Encounter: Payer: Self-pay | Admitting: *Deleted

## 2020-06-16 ENCOUNTER — Ambulatory Visit (INDEPENDENT_AMBULATORY_CARE_PROVIDER_SITE_OTHER): Payer: Medicare Other | Admitting: Student in an Organized Health Care Education/Training Program

## 2020-06-16 ENCOUNTER — Other Ambulatory Visit: Payer: Self-pay | Admitting: *Deleted

## 2020-06-16 ENCOUNTER — Other Ambulatory Visit: Payer: Self-pay

## 2020-06-16 ENCOUNTER — Encounter: Payer: Self-pay | Admitting: Student in an Organized Health Care Education/Training Program

## 2020-06-16 VITALS — HR 70 | Ht 68.0 in | Wt 293.0 lb

## 2020-06-16 DIAGNOSIS — Z86010 Personal history of colonic polyps: Secondary | ICD-10-CM | POA: Diagnosis not present

## 2020-06-16 DIAGNOSIS — Z0000 Encounter for general adult medical examination without abnormal findings: Secondary | ICD-10-CM | POA: Diagnosis not present

## 2020-06-16 MED ORDER — NYSTATIN 100000 UNIT/GM EX CREA: 1.0000 "application " | TOPICAL_CREAM | Freq: Every day | CUTANEOUS | 1 refills | Status: DC | PRN

## 2020-06-16 MED ORDER — DICLOFENAC SODIUM 1 % EX GEL: 2.0000 g | Freq: Four times a day (QID) | CUTANEOUS | 2 refills | Status: DC

## 2020-06-16 NOTE — Progress Notes (Signed)
I discussed the AWV findings with the RN who conducted the visit. I was present in the office suite and immediately available to provide assistance and direction throughout the time the service was provided.   

## 2020-06-16 NOTE — Telephone Encounter (Signed)
Patient requesting refill on Voltaren gel. Unable to set up refill in Epic.  Also, requesting refill on nystatin for area under panniculus.

## 2020-06-16 NOTE — Progress Notes (Signed)
This AWV is being conducted by [facility] only. The patient was located at home and I was located in [facility]. The patient's identity was confirmed using their DOB and current address. The patient or his/her legal guardian has consented to being evaluated through a telephone encounter and understands the associated risks (an examination cannot be done and the patient may need to come in for an appointment) / benefits (allows the patient to remain at home, decreasing exposure to coronavirus). I personally spent 60 minutes conducting the AWV.  Subjective:   [name] is a 64 y.o. female who presents for a Medicare Annual Wellness Visit.  The following items have been reviewed and updated today in the appropriate area in the EMR.   Health Risk Assessment  Height, weight, BMI, and BP Visual acuity if needed Depression screen Fall risk / safety level Advance directive discussion Medical and family history were reviewed and updated Updating list of other providers & suppliers Medication reconciliation, including over the counter medicines Cognitive screen Written screening schedule Risk Factor list Personalized health advice, risky behaviors, and treatment advice  Social History   Social History Narrative   Current Social History 06/16/2020        Patient lives alone in a handicap-accessible ground floor apartment which is 1 story. There are not steps up to the entrance the patient uses.       Patient's method of transportation is personal car or family member.      The highest level of education was 2 years of college.      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       Religious / Personal Beliefs: [employer] Holiness       Other: Vegetarian x 1 year. Have juicer and smoothie machines. Read up on what foods will heal and make them. Lost 50 lbs in last 1.5 years.       Hubbard Hartshorn, BSN, RN-BC             Objective:     Vitals:   06/16/20 0902  Pulse: 70  SpO2: 98%    Vitals: Ht 5\' 8"  (1.727 m)   Wt (!) 293 lb (132.9 kg)   LMP 01/13/2014 Comment: spotting in Feb and bleeding since April  BMI 44.55 kg/m  Vitals are patient reported  Activities of Daily Living In your present state of health, do you have any difficulty performing the following activities: 06/16/2020 04/07/2020  Hearing? N N  Vision? Y N  Comment cataract left eye. right cataract removed 02/2020 -  Difficulty concentrating or making decisions? Y N  Comment Memory issues -  Walking or climbing stairs? Y Y  Comment - -  Dressing or bathing? N N  Comment slow -  Doing errands, shopping? Y N  Some recent data might be hidden    Goals Goals    .  Blood Pressure < 140/90    .  Exercise 3x per week (20 min per time)    .  LDL CALC < 130    .  Weight (  lb) < 273 lb (123.8 kg) (pt-stated)      7% weight loss       Fall Risk Fall Risk  06/16/2020 04/07/2020 10/08/2019 07/09/2019 12/22/2018  Falls in the past year? 0 0 0 0 0  Number falls in past yr: - 0 - 0 -  Injury with Fall? - 0 - - -  Risk for fall due to : Impaired balance/gait;Impaired mobility - - - -  Risk for fall due to: Comment - - - - -  Follow up Education provided;Falls prevention discussed - - - -   CDC Handout on Fall Prevention and Handout on Home Exercise Program, Access codes [email or web address] and [email or web address] given/mailed to patient with exercise band.   Depression Screen PHQ 2/9 Scores 06/16/2020 04/07/2020 12/22/2018 10/16/2018  PHQ - 2 Score 0 0 2 4  PHQ- 9 Score 2 - 4 9     Cognitive Testing Six-Item Cognitive Screener   "I would like to ask you some  questions that ask you to use your memory. I am going to name three objects. Please wait until I say all three words, then repeat them. Remember what they are  because I am going to ask you to name them again in a few minutes. Please repeat these words for me: APPLE--TABLE--PENNY." (Interviewer may repeat names 3 times if necessary but repetition not scored.)  Did patient correctly repeat all three words? Yes - may proceed with screen  What year is this? Correct What month is this? Correct What day of the week is this? Correct  What were the three objects I asked you to remember? . Apple Correct . Table Correct . Penny Correct  Score one point for each incorrect answer.  A score of 2 or more points warrants additional investigation.  Patient's score 0   Assessment and Plan:     On med rec, patient stopped amlodipine and has only been taking metoprolol 1 tab BID instead of 1.5 as instructed.  An appt has been made for tomorrow for a rash on right ankle and to discuss need for rollator  A referral has been sent to Dr. Havery Moros for colonoscopy A referral has been sent to [facility] for screening mammogram Patient made a 7% weight loss goal (< 273 lbs) Patient made a walking goal of 3 x per week for 20 minutes She will begin seated and standing exercises with exercise band to increase strength and balance.  During the course of the visit the patient was educated and counseled about appropriate screening and preventive services as documented in the assessment and plan.  The printed AVS was given to the patient and included an updated screening schedule, a list of risk factors, and personalized health advice.        Velora Heckler, RN  06/16/2020

## 2020-06-16 NOTE — Patient Instructions (Addendum)
Annual Wellness Visit   Medicare Covered Preventative Screenings and Services  Services & Screenings Men and Women Who How Often Need? Date of Last Service Action  Abdominal Aortic Aneurysm Adults with AAA risk factors Once     Alcohol Misuse and Counseling All Adults Screening once a year if no alcohol misuse. Counseling up to 4 face to face sessions.     Bone Density Measurement  Adults at risk for osteoporosis Once every 2 yrs     Lipid Panel Z13.6 All adults without CV disease Once every 5 yrs     Colorectal Cancer   Stool sample or  Colonoscopy All adults 29 and older   Once every year  Every 10 years   Due July 2021. A referral has been sent to Dr. Havery Moros for colonoscopy  Depression All Adults Once a year  Today   Diabetes Screening Blood glucose, post glucose load, or GTT Z13.1  All adults at risk  Pre-diabetics  Once per year  Twice per year     Diabetes  Self-Management Training All adults Diabetics 10 hrs first year; 2 hours subsequent years. Requires Copay     Glaucoma  Diabetics  Family history of glaucoma  African Americans 35 yrs +  Hispanic Americans 18 yrs + Annually - requires coppay     Hepatitis C Z72.89 or F19.20  High Risk for HCV  Born between 1945 and 1965  Annually  Once     HIV Z11.4 All adults based on risk  Annually btw ages 57 & 41 regardless of risk  Annually > 65 yrs if at increased risk     Lung Cancer Screening Asymptomatic adults aged 66-77 with 30 pack yr history and current smoker OR quit within the last 15 yrs Annually Must have counseling and shared decision making documentation before first screen     Medical Nutrition Therapy Adults with   Diabetes  Renal disease  Kidney transplant within past 3 yrs 3 hours first year; 2 hours subsequent years     Obesity and Counseling All adults Screening once a year Counseling if BMI 30 or higher  Today   Tobacco Use Counseling Adults who use tobacco  Up to 8 visits in one  year     Vaccines Z23  Hepatitis B  Influenza   Pneumonia  Adults   Once  Once every flu season  Two different vaccines separated by one year     Next Annual Wellness Visit People with Medicare Every year  Today     Services & Screenings Women Who How Often Need  Date of Last Service Action  Mammogram  Z12.31 Women over 9 One baseline ages 76-39. Annually ager 41 yrs+   A referral has been sent to [facility] for screening mammogram  Pap tests All women Annually if high risk. Every 2 yrs for normal risk women     Screening for cervical cancer with   Pap (Z01.419 nl or Z01.411abnl) &  HPV Z11.51 Women aged 68 to 76 Once every 5 yrs     Screening pelvic and breast exams All women Annually if high risk. Every 2 yrs for normal risk women     Sexually Transmitted Diseases  Chlamydia  Gonorrhea  Syphilis All at risk adults Annually for non pregnant females at increased risk         [place] Men Who How Ofter Need  Date of Last Service Action  Prostate Cancer - DRE & PSA Men  over 50 Annually.  DRE might require a copay.     Sexually Transmitted Diseases  Syphilis All at risk adults Annually for men at increased risk         Things That May Be Affecting Your Health:  Alcohol  Hearing loss  Pain    Depression  Home Safety  Sexual Health   Diabetes X Lack of physical activity  Stress   Difficulty with daily activities  Loneliness  Tiredness   Drug use  Medicines  Tobacco use   Falls  Motor Vehicle Safety X Weight   Food choices  Oral Health  Other    YOUR PERSONALIZED HEALTH PLAN : 1. Schedule your next subsequent Medicare Wellness visit in one year 2. Attend all of your regular appointments to address your medical issues 3. Complete the preventative screenings and services 4. A referral has been sent to Dr. Havery Moros for colonoscopy 5. A referral has been sent to [facility] for screening mammogram 6. Congratulations on your 7%  weight loss goal!! (273 lbs) 7. Congratulations on your walking goal of 3 x per week for 20 minutes!! 8. Begin seated and standing exercises with exercise band to increase strength and balance.     Fall Prevention in the Home, Adult Falls can cause injuries. They can happen to people of all ages. There are many things you can do to make your home safe and to help prevent falls. Ask for help when making these changes, if needed. What actions can I take to prevent falls? General Instructions  Use good lighting in all rooms. Replace any light bulbs that burn out.  Turn on the lights when you go into a dark area. Use night-lights.  Keep items that you use often in easy-to-reach places. Lower the shelves around your home if necessary.  Set up your furniture so you have a clear path. Avoid moving your furniture around.  Do not have throw rugs and other things on the floor that can make you trip.  Avoid walking on wet floors.  If any of your floors are uneven, fix them.  Add color or contrast paint or tape to clearly mark and help you see: ? Any grab bars or handrails. ? First and last steps of stairways. ? Where the edge of each step is.  If you use a stepladder: ? Make sure that it is fully opened. Do not climb a closed stepladder. ? Make sure that both sides of the stepladder are locked into place. ? Ask someone to hold the stepladder for you while you use it.  If there are any pets around you, be aware of where they are. What can I do in the bathroom?      Keep the floor dry. Clean up any water that spills onto the floor as soon as it happens.  Remove soap buildup in the tub or shower regularly.  Use non-skid mats or decals on the floor of the tub or shower.  Attach bath mats securely with double-sided, non-slip rug tape.  If you need to sit down in the shower, use a plastic, non-slip stool.  Install grab bars by the toilet and in the tub and shower. Do not use towel  bars as grab bars. What can I do in the bedroom?  Make sure that you have a light by your bed that is easy to reach.  Do not use any sheets or blankets that are too big for your bed. They should not hang  down onto the floor.  Have a firm chair that has side arms. You can use this for support while you get dressed. What can I do in the kitchen?  Clean up any spills right away.  If you need to reach something above you, use a strong step stool that has a grab bar.  Keep electrical cords out of the way.  Do not use floor polish or wax that makes floors slippery. If you must use wax, use non-skid floor wax. What can I do with my stairs?  Do not leave any items on the stairs.  Make sure that you have a light switch at the top of the stairs and the bottom of the stairs. If you do not have them, ask someone to add them for you.  Make sure that there are handrails on both sides of the stairs, and use them. Fix handrails that are broken or loose. Make sure that handrails are as long as the stairways.  Install non-slip stair treads on all stairs in your home.  Avoid having throw rugs at the top or bottom of the stairs. If you do have throw rugs, attach them to the floor with carpet tape.  Choose a carpet that does not hide the edge of the steps on the stairway.  Check any carpeting to make sure that it is firmly attached to the stairs. Fix any carpet that is loose or worn. What can I do on the outside of my home?  Use bright outdoor lighting.  Regularly fix the edges of walkways and driveways and fix any cracks.  Remove anything that might make you trip as you walk through a door, such as a raised step or threshold.  Trim any bushes or trees on the path to your home.  Regularly check to see if handrails are loose or broken. Make sure that both sides of any steps have handrails.  Install guardrails along the edges of any raised decks and porches.  Clear walking paths of anything  that might make someone trip, such as tools or rocks.  Have any leaves, snow, or ice cleared regularly.  Use sand or salt on walking paths during winter.  Clean up any spills in your garage right away. This includes grease or oil spills. What other actions can I take?  Wear shoes that: ? Have a low heel. Do not wear high heels. ? Have rubber bottoms. ? Are comfortable and fit you well. ? Are closed at the toe. Do not wear open-toe sandals.  Use tools that help you move around (mobility aids) if they are needed. These include: ? Canes. ? Walkers. ? Scooters. ? Crutches.  Review your medicines with your doctor. Some medicines can make you feel dizzy. This can increase your chance of falling. Ask your doctor what other things you can do to help prevent falls. Where to find more information  Centers for Disease Control and Prevention, STEADI: https://garcia.biz/  [employer] on Aging: BrainJudge.co.uk Contact a doctor if:  You are afraid of falling at home.  You feel weak, drowsy, or dizzy at home.  You fall at home. Summary  There are many simple things that you can do to make your home safe and to help prevent falls.  Ways to make your home safe include removing tripping hazards and installing grab bars in the bathroom.  Ask for help when making these changes in your home. This information is not intended to replace advice given to you by your  health care provider. Make sure you discuss any questions you have with your health care provider. Document Revised: 03/01/2019 Document Reviewed: 06/23/2017 Elsevier Patient Education  2020 [place] Maintenance, Female Adopting a healthy lifestyle and getting preventive care are important in promoting health and wellness. Ask your health care provider about:  The right schedule for you to have regular tests and exams.  Things you can do on your own to prevent diseases and keep yourself  healthy. What should I know about diet, weight, and exercise? Eat a healthy diet   Eat a diet that includes plenty of vegetables, fruits, low-fat dairy products, and lean protein.  Do not eat a lot of foods that are high in solid fats, added sugars, or sodium. Maintain a healthy weight Body mass index (BMI) is used to identify weight problems. It estimates body fat based on height and weight. Your health care provider can help determine your BMI and help you achieve or maintain a healthy weight. Get regular exercise Get regular exercise. This is one of the most important things you can do for your health. Most adults should:  Exercise for at least 150 minutes each week. The exercise should increase your heart rate and make you sweat (moderate-intensity exercise).  Do strengthening exercises at least twice a week. This is in addition to the moderate-intensity exercise.  Spend less time sitting. Even light physical activity can be beneficial. Watch cholesterol and blood lipids Have your blood tested for lipids and cholesterol at 66 years of age, then have this test every 5 years. Have your cholesterol levels checked more often if:  Your lipid or cholesterol levels are high.  You are older than 66 years of age.  You are at high risk for heart disease. What should I know about cancer screening? Depending on your health history and family history, you may need to have cancer screening at various ages. This may include screening for:  Breast cancer.  Cervical cancer.  Colorectal cancer.  Skin cancer.  Lung cancer. What should I know about heart disease, diabetes, and high blood pressure? Blood pressure and heart disease  High blood pressure causes heart disease and increases the risk of stroke. This is more likely to develop in people who have high blood pressure readings, are of African descent, or are overweight.  Have your blood pressure checked: ? Every 3-5 years if you are  40-55 years of age. ? Every year if you are 58 years old or older. Diabetes Have regular diabetes screenings. This checks your fasting blood sugar level. Have the screening done:  Once every three years after age 18 if you are at a normal weight and have a low risk for diabetes.  More often and at a younger age if you are overweight or have a high risk for diabetes. What should I know about preventing infection? Hepatitis B If you have a higher risk for hepatitis B, you should be screened for this virus. Talk with your health care provider to find out if you are at risk for hepatitis B infection. Hepatitis C Testing is recommended for:  Everyone born from [birth year] through 1967.  Anyone with known risk factors for hepatitis C. Sexually transmitted infections (STIs)  Get screened for STIs, including gonorrhea and chlamydia, if: ? You are sexually active and are younger than 66 years of age. ? You are older than 66 years of age and your health care provider tells you that you are at risk  for this type of infection. ? Your sexual activity has changed since you were last screened, and you are at increased risk for chlamydia or gonorrhea. Ask your health care provider if you are at risk.  Ask your health care provider about whether you are at high risk for HIV. Your health care provider may recommend a prescription medicine to help prevent HIV infection. If you choose to take medicine to prevent HIV, you should first get tested for HIV. You should then be tested every 3 months for as long as you are taking the medicine. Pregnancy  If you are about to stop having your period (premenopausal) and you may become pregnant, seek counseling before you get pregnant.  Take 400 to 800 micrograms (mcg) of folic acid every day if you become pregnant.  Ask for birth control (contraception) if you want to prevent pregnancy. Osteoporosis and menopause Osteoporosis is a disease in which the bones lose  minerals and strength with aging. This can result in bone fractures. If you are 19 years old or older, or if you are at risk for osteoporosis and fractures, ask your health care provider if you should:  Be screened for bone loss.  Take a calcium or vitamin D supplement to lower your risk of fractures.  Be given hormone replacement therapy (HRT) to treat symptoms of menopause. Follow these instructions at home: Lifestyle  Do not use any products that contain nicotine or tobacco, such as cigarettes, e-cigarettes, and chewing tobacco. If you need help quitting, ask your health care provider.  Do not use street drugs.  Do not share needles.  Ask your health care provider for help if you need support or information about quitting drugs. Alcohol use  Do not drink alcohol if: ? Your health care provider tells you not to drink. ? You are pregnant, may be pregnant, or are planning to become pregnant.  If you drink alcohol: ? Limit how much you use to 0-1 drink a day. ? Limit intake if you are breastfeeding.  Be aware of how much alcohol is in your drink. In the [place] one drink equals one 12 oz bottle of beer (355 mL), one 5 oz glass of wine (148 mL), or one 1 oz glass of hard liquor (44 mL). General instructions  Schedule regular health, dental, and eye exams.  Stay current with your vaccines.  Tell your health care provider if: ? You often feel depressed. ? You have ever been abused or do not feel safe at home. Summary  Adopting a healthy lifestyle and getting preventive care are important in promoting health and wellness.  Follow your health care provider's instructions about healthy diet, exercising, and getting tested or screened for diseases.  Follow your health care provider's instructions on monitoring your cholesterol and blood pressure. This information is not intended to replace advice given to you by your health care provider. Make sure you discuss any questions you  have with your health care provider. Document Revised: 11/01/2018 Document Reviewed: 11/01/2018 Elsevier Patient Education  2020 [employer].

## 2020-06-17 ENCOUNTER — Encounter: Payer: Self-pay | Admitting: Internal Medicine

## 2020-06-17 ENCOUNTER — Ambulatory Visit (INDEPENDENT_AMBULATORY_CARE_PROVIDER_SITE_OTHER): Payer: Medicare Other | Admitting: Internal Medicine

## 2020-06-17 VITALS — BP 167/74 | HR 68 | Temp 98.1°F | Wt 294.4 lb

## 2020-06-17 DIAGNOSIS — W57XXXA Bitten or stung by nonvenomous insect and other nonvenomous arthropods, initial encounter: Secondary | ICD-10-CM | POA: Insufficient documentation

## 2020-06-17 DIAGNOSIS — W57XXXS Bitten or stung by nonvenomous insect and other nonvenomous arthropods, sequela: Secondary | ICD-10-CM

## 2020-06-17 DIAGNOSIS — M1711 Unilateral primary osteoarthritis, right knee: Secondary | ICD-10-CM

## 2020-06-17 NOTE — Progress Notes (Signed)
[facility] Clinic Attending  Case discussed with Dr. Bloomfield at the time of the visit.  We reviewed the AWV findings.  I agree with the assessment, diagnosis, and plan of care documented in the AWV note.     

## 2020-06-17 NOTE — Patient Instructions (Addendum)
Thank you for allowing [place] to provide your care today. Today we discussed your skin changes    I have ordered no labs for you. I will call if any are abnormal.    Today we made no changes to your medications.    Please follow-up as needed.    Should you have any questions or concerns please call the internal medicine clinic at [phone].     Foot Care, Adult Foot care is an important part of your health. Noticing and addressing any potential problems early is the best way to prevent future foot problems. How to care for your [place] your feet daily with warm water and mild soap. Do not use hot water. Then, pat your feet and the areas between your toes until they are completely dry. Do not soak your feet as this can dry your skin.  Trim your toenails straight across. Do not dig under them or around the cuticle. File the edges of your nails with an emery board or nail file.  On the skin on your feet and on dry, brittle nails, apply a moisturizing lotion or petroleum jelly that is unscented and does not contain alcohol. Do not apply lotion between your toes. Shoes and Socks   Wear clean socks or stockings every day. Make sure they are not too tight.  Wear shoes that fit properly and have enough cushioning. To break in new shoes, wear them for just a few hours a day. This prevents you from injuring your feet. Always look in your shoes before you put them on to be sure there are no objects inside. Wounds, Scrapes, Corns, and Calluses  Check your feet daily for blisters, cuts, and redness. If you cannot see the bottom of your feet, use a mirror or ask someone for help.  Do not cut corns or calluses. Do not try to remove them with medicine.  If you find a minor scrape, cut, or break in the skin on your feet, keep it and the skin around it clean and dry. These areas may be cleaned with mild soap and water. Do not clean the area with peroxide, alcohol, or iodine.  If you  have a wound, scrape, corn, or callus on your foot, look at it several times a day to make sure it is healing and is not infected. Check for: ? More redness, swelling, or pain. ? More fluid or blood. ? Warmth. ? Pus or a bad smell. General Instructions  Do not cross your legs. That may decrease the blood flow to your feet.  Do not use heating pads or hot water bottles on your feet. They may burn your skin. If you have lost feeling in your feet or legs, you may not know it is happening until it is too late.  Make sure your health care provider does a complete foot exam at least annually or more often if you have foot problems. If you have foot problems, report any cuts, sores, or bruises to your health care provider immediately. Contact a health care provider if:  You have a medical condition that increases your risk of infection and you have any cuts, sores, or bruises on your feet.  You have an injury that is not healing.  You notice redness on your legs or feet.  You feel burning or tingling in your legs or feet.  You have pain or cramps in your legs or feet.  Your legs or feet are numb.  Your feet always feel cold.  You have pain around a toenail. Get help right away if:  You have a wound, scrape, corn, or callus on your foot and: ? You have more redness, swelling, or pain. ? You have more fluid or blood. ? Your wound, scrape, corn, or callus feels warm to the touch. ? You have pus or a bad smell coming from the wound, scrape, corn, or callus. ? You have a fever.  You have a red line going up your leg. This information is not intended to replace advice given to you by your health care provider. Make sure you discuss any questions you have with your health care provider. Document Revised: 11/25/2016 Document Reviewed: 04/16/2016 Elsevier Patient Education  [place].

## 2020-06-17 NOTE — Progress Notes (Signed)
CC: Ankle rash  HPI: Ms.Tonette A Botelho is a 64 y.o. with PMH listed below presenting with complaint of ankle rash. Please see problem based assessment and plan for further details.  Past Medical History:  Diagnosis Date  . Acute urinary retention s/p Foley 01/07/2012  . CAD (coronary artery disease)    a. 12/2013 s/p CABG x 5 Dr. Darcey Nora (LIMA to LAD, SVG to diagonal, SVG to OM1, SVG to OM2, SVG to PDA)  . Carotid arterial disease ([place])    a. 12/2013 Carotid U/S: 1-39% bilat ICA stenosis.  . Carpal tunnel syndrome, bilateral   . Hemorrhoids, internal, with bleeding & prolapse 12/13/2011  . Hernia, abdominal   . History of blood transfusion    CABG and hysterectomy  . History of kidney stones   . Hypertension   . Iron deficiency 12-07-2011  . Keloid scar    a. Sternal Keloid s/p CABG with ongoing pain.  . Morbid obesity ([place])   . Myocardial infarct ([place])   . Neuropathy   . Osteoarthritis   . Osteoporosis   . Seasonal allergies   . Sleep apnea    no CPAP machine; sleep study 02/2010 REM AHI 61.7/hr, total sleep REM 14.8/hr  . Uterine cancer ([place]) 7/15   clinical stage IA grade 1 endometrioid endometrial cancer   Review of Systems: Review of Systems  Constitutional: Negative for chills, fever and malaise/fatigue.  Respiratory: Negative for shortness of breath.   Cardiovascular: Negative for chest pain, palpitations and leg swelling.  Gastrointestinal: Negative for diarrhea, nausea and vomiting.  Musculoskeletal: Positive for back pain and joint pain. Negative for falls.  Skin: Positive for itching.  All other systems reviewed and are negative.   Physical Exam: Vitals:   06/17/20 1603  BP: (!) 167/74  Pulse: 68  Temp: 98.1 F (36.7 C)  TempSrc: Oral  SpO2: 100%  Weight: (!) 294 lb 6.4 oz (133.5 kg)    Gen: Well-developed, well nourished, NAD HEENT: NCAT head, hearing intact CV: RRR, S1, S2 normal, Healed sternotomy scar Pulm: CTAB, No rales, no wheezes Extm:  Crepitus on R knee extension, Peripheral pulses intact, No peripheral edema Skin: Dry, Warm, Area of well-cirumscribed hypopigmentation w/o surrounding rash or edema. (pictured below)     Assessment & Plan:   Osteoarthritis of right knee Ms.Mischke mentions significant difficulty with ambulation especially when going to the bathroom due to her osteoarthritis and body habitus. She mentions at home she has to use two canes on each hand as a make-shift walker to help her get to the bathroom. She mentions that she does not currently have a walker and she has some 'close calls' where she almost fell while using two canes.  A/P Present w/ gait instability due to osteoarthritis and morbid obesity. Currently using canes for ambulation but concern for moderate fall risk. Will place order for walker.  - DME order for walker  Bug bite Ms.Self is a 64 yo F w/ PMH of OSA, GERD, DJD, Morbid obesity presenting to the [facility] for evaluation of a rash on her ankle. She mentions that her symptom started couple weeks ago when she was sitting on a porch outside and noticed raised area with appearance of bug bite. She has been putting anti-fungals, alcohol wipes, and moisturizing lotion on the area which appeared to have healed but she continues have significant pruritus. She mentions also observing multiple vesicles around the area which spontaneously resolved. She denies any fevers, chills, nausea, vomiting, tenderness.  A/P Presents  w/ area of hypopigmentation on her ankle. No obvious rash concerning for pruritic lesion. Suspect pruritus caused by excessive use of cleaning products.   - Advised to stop anti-fungals and alcohol wipes. Emollients okay.    Patient seen with Dr. Evette Doffing  -Gilberto Better, [facility] Pager: [phone]

## 2020-06-18 ENCOUNTER — Encounter: Payer: Self-pay | Admitting: Internal Medicine

## 2020-06-18 NOTE — Progress Notes (Signed)
Internal Medicine Clinic Attending  I saw and evaluated the patient.  I personally confirmed the key portions of the history and exam documented by Dr. Lee and I reviewed pertinent patient test results.  The assessment, diagnosis, and plan were formulated together and I agree with the documentation in the resident's note.  

## 2020-06-18 NOTE — Addendum Note (Signed)
Addended by: Mosetta Anis on: 06/18/2020 11:07 AM   Modules accepted: Orders

## 2020-06-18 NOTE — Assessment & Plan Note (Addendum)
[name] mentions significant difficulty with ambulation especially when going to the bathroom due to her osteoarthritis and body habitus. She mentions at home she has to use two canes on each hand as a make-shift walker to help her get to the bathroom. She mentions that she does not currently have a walker and she has some 'close calls' where she almost fell while using two canes.  A/P Present w/ gait instability due to osteoarthritis and morbid obesity. Currently using canes for ambulation but concern for moderate fall risk. Also noted to be 294lbs with BMI of 44.8. Likely needs bariatric walker as standard walker size is too small for her with increased fall risk. Will place order for bariatric rollator.   - DME order for walker

## 2020-06-18 NOTE — Assessment & Plan Note (Addendum)
[name] is a 64 yo F w/ PMH of OSA, GERD, DJD, Morbid obesity presenting to the [facility] for evaluation of a rash on her ankle. She mentions that her symptom started couple weeks ago when she was sitting on a porch outside and noticed raised area with appearance of bug bite. She has been putting anti-fungals, alcohol wipes, and moisturizing lotion on the area which appeared to have healed but she continues have significant pruritus. She mentions also observing multiple vesicles around the area which spontaneously resolved. She denies any fevers, chills, nausea, vomiting, tenderness.  A/P Presents w/ area of hypopigmentation on her ankle. No obvious rash concerning for pruritic lesion. Suspect pruritus caused by excessive use of cleaning products.   - Advised to stop anti-fungals and alcohol wipes. Emollients okay.

## 2020-06-19 ENCOUNTER — Telehealth: Payer: Self-pay | Admitting: *Deleted

## 2020-06-19 NOTE — Telephone Encounter (Signed)
Theophilus Bones, RN; Skeet Latch; [place], Delaney Meigs, [place]; [place], Mamie C, [place]   Got it, thank you

## 2020-06-19 NOTE — Telephone Encounter (Signed)
CM sent to Skeet Latch at [facility] for Bariatric Rollator. F2F was 06/17/2020. Hubbard Hartshorn, BSN, RN-BC

## 2020-06-23 ENCOUNTER — Encounter: Payer: Medicare Other | Admitting: Student in an Organized Health Care Education/Training Program

## 2020-06-24 ENCOUNTER — Encounter: Payer: Self-pay | Admitting: Nurse Practitioner

## 2020-06-24 ENCOUNTER — Telehealth: Payer: Self-pay | Admitting: Gastroenterology

## 2020-06-24 NOTE — Telephone Encounter (Signed)
Please ask her to schedule an office visit to further discuss

## 2020-06-24 NOTE — Telephone Encounter (Signed)
Called and spoke with patient in reference to referral received for a repeat colonoscopy. Patient states she is usually constipated and ha changed her diet to vegetarian which has helped. I advised the patient that there is a recall for 2023 however the patient would like to discuss to see if it should be done sooner.

## 2020-07-14 ENCOUNTER — Encounter: Payer: Self-pay | Admitting: Student in an Organized Health Care Education/Training Program

## 2020-07-14 ENCOUNTER — Ambulatory Visit (INDEPENDENT_AMBULATORY_CARE_PROVIDER_SITE_OTHER): Payer: Medicare Other | Admitting: Student in an Organized Health Care Education/Training Program

## 2020-07-14 VITALS — BP 143/74 | HR 56 | Wt 293.2 lb

## 2020-07-14 DIAGNOSIS — E785 Hyperlipidemia, unspecified: Secondary | ICD-10-CM

## 2020-07-14 DIAGNOSIS — Z0001 Encounter for general adult medical examination with abnormal findings: Secondary | ICD-10-CM

## 2020-07-14 DIAGNOSIS — R7302 Impaired glucose tolerance (oral): Secondary | ICD-10-CM | POA: Insufficient documentation

## 2020-07-14 DIAGNOSIS — Z23 Encounter for immunization: Secondary | ICD-10-CM | POA: Diagnosis not present

## 2020-07-14 DIAGNOSIS — I10 Essential (primary) hypertension: Secondary | ICD-10-CM | POA: Diagnosis not present

## 2020-07-14 DIAGNOSIS — E559 Vitamin D deficiency, unspecified: Secondary | ICD-10-CM | POA: Diagnosis not present

## 2020-07-14 DIAGNOSIS — I2510 Atherosclerotic heart disease of native coronary artery without angina pectoris: Secondary | ICD-10-CM

## 2020-07-14 DIAGNOSIS — Z0000 Encounter for general adult medical examination without abnormal findings: Secondary | ICD-10-CM

## 2020-07-14 LAB — POCT GLYCOSYLATED HEMOGLOBIN (HGB A1C): Hemoglobin A1C: 4.7 % (ref 4.0–5.6)

## 2020-07-14 LAB — GLUCOSE, CAPILLARY: Glucose-Capillary: 134 mg/dL — ABNORMAL HIGH (ref 70–99)

## 2020-07-14 NOTE — Assessment & Plan Note (Signed)
History of ischemic heart disease status post coronary artery bypass grafting.  No symptoms of angina, okay exercise tolerance.  Mostly limited by osteoarthritis and obesity.  Continue with medical management including aspirin 81 mg daily and rosuvastatin 20 mg daily.  Working on blood pressure control, monitoring for diabetes.  Patient is working on weight loss.

## 2020-07-14 NOTE — Progress Notes (Signed)
   Assessment and Plan:  See Encounters tab for problem-based medical decision making.   __________________________________________________________  HPI:   64 year old person here for follow-up of hypertension.  Doing very well last few months.  Still complains of some pain in her right knee and left shoulder that at times her function limiting.  However she is independent in all her activities of daily living.  Good quality of life.  Continues to have a vegan diet, has had stable weight the last few months.  Mostly good adherence with her medications.  She did stop taking amlodipine about a month ago as she perceives that it may have been causing dry mouth and some lightheadedness.  Complains of some stiffness in her hands and some limited range of motion of her bilateral third digit.  __________________________________________________________  Problem List: Patient Active Problem List   Diagnosis Date Noted  . Atherosclerosis of aorta ([place]) 06/20/2015    Priority: High  . Coronary atherosclerosis of native coronary artery 01/05/2014    Priority: High  . Osteoarthritis of right knee 11/07/2014    Priority: Medium  . Morbid obesity due to excess calories ([place]) 05/10/2012    Priority: Medium  . Obstructive sleep apnea 03/27/2010    Priority: Medium  . Essential hypertension 06/30/2007    Priority: Medium  . Glucose intolerance (impaired glucose tolerance) 07/14/2020    Priority: Low  . Vitamin D deficiency 07/14/2020    Priority: Low  . Depression 10/16/2018    Priority: Low  . Osteoarthritis of left shoulder 10/24/2017    Priority: Low  . Peripheral neuropathy 08/19/2017    Priority: Low  . Health care maintenance 10/09/2014    Priority: Low  . GERD 08/06/2008    Priority: Low  . Hyperlipidemia 10/31/2007    Priority: Low    Medications: Reconciled today in Epic __________________________________________________________  Physical Exam:  Vital Signs: Vitals:    07/14/20 0827  BP: (!) 143/74  Pulse: (!) 56  SpO2: 99%  Weight: 293 lb 3.2 oz (133 kg)    Gen: Well appearing, NAD Neck: No cervical LAD, No thyromegaly or nodules, No JVD, prominent carotid pulsations on the right. CV: RRR, no murmurs Pulm: Normal effort, CTA throughout, no wheezing Ext: Warm, no edema, bilateral hands, third digit seems to have early Dupuytren contractures

## 2020-07-14 NOTE — Assessment & Plan Note (Signed)
Blood pressure is a little above goal today given history of ischemic heart disease.  Plan to restart amlodipine 5 mg daily, continue losartan 100 mg daily, and metoprolol 25 mg twice daily.  Goal blood pressure is less than 130/80.  Check BMP today.

## 2020-07-14 NOTE — Assessment & Plan Note (Signed)
Up-to-date on Covid vaccination.  Due for repeat colonoscopy, last was 3 years ago but had multiple adenomatous polyps.  Has an appointment on September 15 with [facility].  Due for Prevnar and Tdap vaccine, will give Prevnar today.  Has a appointment for mammogram on August 30.

## 2020-07-14 NOTE — Assessment & Plan Note (Signed)
Stable weight, weight is 293 pounds today with a BMI of 44.  Doing a good job with her nutrition modifications.

## 2020-07-14 NOTE — Assessment & Plan Note (Signed)
History of vitamin D deficiency, takes a modest dose of vitamin D supplement every day.  Plan to check vitamin D levels today.  DEXA scan completed in 2011.

## 2020-07-14 NOTE — Patient Instructions (Signed)
Is great seeing you today in clinic.  You are doing an excellent job with your health.  We talked about restarting amlodipine for your blood pressure.  Continue all your other medicines as prescribed on this list.  Follow-up with the GI doctors in September for your routine colonoscopy.  Let me know if you have any problems with your knee and needed an injection in the future.  Otherwise I will plan to see you back in 6 months for routine follow-up.  I will send you a letter with the results of today's blood work or call you if there are any problems.

## 2020-07-14 NOTE — Assessment & Plan Note (Addendum)
History of hyperglycemia on labs in the past, peak serum glucose of 150 in 2016.  A1c's have been normal.  We will check another annual A1c today.  Certainly at risk for metabolic syndrome given history of obesity, hypertension, and hyperlipidemia.

## 2020-07-15 ENCOUNTER — Telehealth: Payer: Self-pay | Admitting: Student in an Organized Health Care Education/Training Program

## 2020-07-15 ENCOUNTER — Encounter: Payer: Self-pay | Admitting: Student in an Organized Health Care Education/Training Program

## 2020-07-15 LAB — BMP8+ANION GAP
Anion Gap: 14 mmol/L (ref 10.0–18.0)
BUN/Creatinine Ratio: 10 — ABNORMAL LOW (ref 12–28)
BUN: 7 mg/dL — ABNORMAL LOW (ref 8–27)
CO2: 24 mmol/L (ref 20–29)
Calcium: 9.5 mg/dL (ref 8.7–10.3)
Chloride: 99 mmol/L (ref 96–106)
Creatinine, Ser: 0.73 mg/dL (ref 0.57–1.00)
GFR calc Af Amer: 99 mL/min/{1.73_m2} (ref >59)
GFR calc non Af Amer: 86 mL/min/{1.73_m2} (ref >59)
Glucose: 133 mg/dL — ABNORMAL HIGH (ref 65–99)
Potassium: 4 mmol/L (ref 3.5–5.2)
Sodium: 137 mmol/L (ref 134–144)

## 2020-07-15 LAB — LIPID PANEL
Chol/HDL Ratio: 2.7 ratio (ref 0.0–4.4)
Cholesterol, Total: 141 mg/dL (ref 100–199)
HDL: 52 mg/dL (ref >39)
LDL Chol Calc (NIH): 73 mg/dL (ref 0–99)
Triglycerides: 86 mg/dL (ref 0–149)
VLDL Cholesterol Cal: 16 mg/dL (ref 5–40)

## 2020-07-15 LAB — VITAMIN D 25 HYDROXY (VIT D DEFICIENCY, FRACTURES): Vit D, 25-Hydroxy: 26.3 ng/mL — ABNORMAL LOW (ref 30.0–100.0)

## 2020-07-15 NOTE — Telephone Encounter (Signed)
RTC, states she thought someone tried to call her from [facility] and left a message for her.  RN reviewed chart and informed patient that a letter was written today per PCP regarding her lab values.  RN read letter to patient and patient verbalized understanding.  States she has no further questions.  Instructed to call [facility] for any concerns. SChaplin, RN,BSN

## 2020-07-15 NOTE — Telephone Encounter (Signed)
Pls contact pt [phone]

## 2020-07-21 ENCOUNTER — Ambulatory Visit: Payer: Medicare Other

## 2020-07-22 ENCOUNTER — Ambulatory Visit
Admission: RE | Admit: 2020-07-22 | Discharge: 2020-07-22 | Disposition: A | Discharge status: Home / Self Care | Payer: Medicare Other | Source: Ambulatory Visit | Attending: Student in an Organized Health Care Education/Training Program | Admitting: Student in an Organized Health Care Education/Training Program

## 2020-07-22 ENCOUNTER — Other Ambulatory Visit: Payer: Self-pay

## 2020-07-22 DIAGNOSIS — Z0000 Encounter for general adult medical examination without abnormal findings: Secondary | ICD-10-CM

## 2020-07-22 DIAGNOSIS — Z1231 Encounter for screening mammogram for malignant neoplasm of breast: Secondary | ICD-10-CM | POA: Diagnosis not present

## 2020-07-24 ENCOUNTER — Other Ambulatory Visit: Payer: Self-pay | Admitting: Student in an Organized Health Care Education/Training Program

## 2020-07-24 DIAGNOSIS — R928 Other abnormal and inconclusive findings on diagnostic imaging of breast: Secondary | ICD-10-CM

## 2020-07-25 ENCOUNTER — Other Ambulatory Visit: Payer: Self-pay

## 2020-07-25 ENCOUNTER — Other Ambulatory Visit: Payer: Medicare Other

## 2020-07-25 DIAGNOSIS — Z20822 Contact with and (suspected) exposure to covid-19: Secondary | ICD-10-CM

## 2020-07-26 LAB — NOVEL CORONAVIRUS, NAA: SARS-CoV-2, NAA: NOT DETECTED

## 2020-07-30 ENCOUNTER — Other Ambulatory Visit: Payer: Self-pay | Admitting: Internal Medicine

## 2020-07-30 ENCOUNTER — Ambulatory Visit
Admission: RE | Admit: 2020-07-30 | Discharge: 2020-07-30 | Disposition: A | Discharge status: Home / Self Care | Payer: Medicare Other | Source: Ambulatory Visit | Attending: Student in an Organized Health Care Education/Training Program | Admitting: Student in an Organized Health Care Education/Training Program

## 2020-07-30 DIAGNOSIS — N6489 Other specified disorders of breast: Secondary | ICD-10-CM | POA: Diagnosis not present

## 2020-07-30 DIAGNOSIS — R928 Other abnormal and inconclusive findings on diagnostic imaging of breast: Secondary | ICD-10-CM

## 2020-07-31 ENCOUNTER — Telehealth: Payer: Self-pay | Admitting: General Practice

## 2020-07-31 NOTE — Telephone Encounter (Signed)
Negative COVID results given. Patient results "NOT Detected." Caller expressed understanding. ° °

## 2020-08-06 ENCOUNTER — Other Ambulatory Visit (INDEPENDENT_AMBULATORY_CARE_PROVIDER_SITE_OTHER): Payer: Medicare Other

## 2020-08-06 ENCOUNTER — Encounter: Payer: Self-pay | Admitting: Nurse Practitioner

## 2020-08-06 ENCOUNTER — Ambulatory Visit (INDEPENDENT_AMBULATORY_CARE_PROVIDER_SITE_OTHER): Payer: Medicare Other | Admitting: Nurse Practitioner

## 2020-08-06 VITALS — BP 124/68 | HR 60 | Ht 68.0 in | Wt 293.2 lb

## 2020-08-06 DIAGNOSIS — K5900 Constipation, unspecified: Secondary | ICD-10-CM

## 2020-08-06 DIAGNOSIS — K219 Gastro-esophageal reflux disease without esophagitis: Secondary | ICD-10-CM

## 2020-08-06 DIAGNOSIS — Z86010 Personal history of colonic polyps: Secondary | ICD-10-CM | POA: Diagnosis not present

## 2020-08-06 DIAGNOSIS — I25810 Atherosclerosis of coronary artery bypass graft(s) without angina pectoris: Secondary | ICD-10-CM

## 2020-08-06 LAB — CBC WITH DIFFERENTIAL/PLATELET
Basophils Absolute: 0 10*3/uL (ref 0.0–0.1)
Basophils Relative: 0.8 % (ref 0.0–3.0)
Eosinophils Absolute: 0.1 10*3/uL (ref 0.0–0.7)
Eosinophils Relative: 2.2 % (ref 0.0–5.0)
HCT: 38.9 % (ref 36.0–46.0)
Hemoglobin: 12.9 g/dL (ref 12.0–15.0)
Lymphocytes Relative: 39.8 % (ref 12.0–46.0)
Lymphs Abs: 2.4 10*3/uL (ref 0.7–4.0)
MCHC: 33.1 g/dL (ref 30.0–36.0)
MCV: 85.9 fl (ref 78.0–100.0)
Monocytes Absolute: 0.5 10*3/uL (ref 0.1–1.0)
Monocytes Relative: 8.4 % (ref 3.0–12.0)
Neutro Abs: 3 10*3/uL (ref 1.4–7.7)
Neutrophils Relative %: 48.8 % (ref 43.0–77.0)
Platelets: 207 10*3/uL (ref 150.0–400.0)
RBC: 4.52 Mil/uL (ref 3.87–5.11)
RDW: 15.1 % (ref 11.5–15.5)
WBC: 6.1 10*3/uL (ref 4.0–10.5)

## 2020-08-06 LAB — TSH: TSH: 1.6 u[IU]/mL (ref 0.35–4.50)

## 2020-08-06 NOTE — Progress Notes (Signed)
08/07/2020 [name] [id number] 1955-11-30   CHIEF COMPLAINT: change in bowel pattern   HISTORY OF PRESENT ILLNESS:  [name] is a 64 year old female with a past medical history of obesity, osteoarthritis, hypertension, coronary artery disease s/p 5 vessel CABG 2015, carotid artery disease, sleep apnea does not use cpap, osteoporosis, uterine cancer and colon polyps. Past partial hysterectomy. Left knee total replacement. She presents today for further evaluation regarding a change in bowel pattern. She questions if she needs to have a colonoscopy. She complains of having constipation. Her stools are hard and dry, takes longer to pass, sitting too long on the commode. No rectal bleeding or melena. She started taking Miralax twice weekly about one week ago. She is drinking a smoothie with prunes a few days weekly. She takes Metamucil on and off. She reports having brief 1 second twinges of epigastric an/or right/left lower abdominal pain. She is eating a vegetarian diet which has resulted in losing 54 lbs over the past year. She intends to lose more weight in hopes of reducing her right knee pain. She has heartburn about 10 days monthly. She is taking Esomeprazole 20mg  QOD.  EGD 12/09/2011 showed multiple fundic gland polyps to the stomach.  She takes ASA 81 mg once daily.  No other oral NSAIDs.  She has a history of colon polyps.  Her most recent colonoscopy by Dr. Havery Moros was 06/02/2017 which identified 3 (3-16mm)  tubular adenomatous polyps which were removed from the colon.  She was initially advised to repeat a colonoscopy in 3 years then the recommendations were changed to a 5-year recall.  She questions if she should have a colonoscopy at this time.    CBC Latest Ref Rng & Units 08/06/2020 07/13/2019 10/16/2018  WBC 4.0 - 10.5 K/uL 6.1 8.7 6.4  Hemoglobin 12.0 - 15.0 g/dL 12.9 13.5 13.3  Hematocrit 36 - 46 % 38.9 41.8 39.5  Platelets 150 - 400 K/uL 207.0 222 227   CMP Latest  Ref Rng & Units 07/14/2020 07/13/2019 07/09/2019  Glucose 65 - 99 mg/dL 133(H) 112(H) 103(H)  BUN 8 - 27 mg/dL 7(L) 13 10  Creatinine 0.57 - 1.00 mg/dL 0.73 1.03(H) 0.88  Sodium 134 - 144 mmol/L 137 139 138  Potassium 3.5 - 5.2 mmol/L 4.0 3.9 4.1  Chloride 96 - 106 mmol/L 99 104 101  CO2 20 - 29 mmol/L 24 26 23   Calcium 8.7 - 10.3 mg/dL 9.5 8.8(L) 9.1  Total Protein 6.0 - 8.5 g/dL - - -  Total Bilirubin 0.0 - 1.2 mg/dL - - -  Alkaline Phos 39 - 117 IU/L - - -  AST 0 - 40 IU/L - - -  ALT 0 - 32 IU/L - - -    Colonoscopy 06/02/2017 by Dr. Havery Moros: - One 4 mm polyp in the transverse colon, removed with a cold snare.  - One 3 mm polyp in the descending colon, removed with a cold snare. - One 3 mm polyp in the rectum, removed with a cold snare. Resected and retrieved. - Diverticulosis in the sigmoid colon. - The examination was otherwise normal. - Recall colonoscopy 3 years then changed to 5 years.  Surgical [P], transverse, descending and rectal, polyps (3) - TUBULAR ADENOMA. - NO HIGH GRADE DYSPLASIA OR MALIGNANCY.  EGD 12/09/2011: Multiple polyps in the fundus  Past Surgical History:  Procedure Laterality Date  . BREAST REDUCTION SURGERY Bilateral 10/10/2015   Procedure: BILATERAL MAMMARY REDUCTION  (BREAST) WITH FREE  NIPPLE GRAFT;  Surgeon: Irene Limbo, MD;  Location: [facility];  Service: Plastics;  Laterality: Bilateral;  . CARDIAC CATHETERIZATION    . COLONOSCOPY  2009  . COLONOSCOPY W/ BIOPSIES AND POLYPECTOMY    . CORONARY ARTERY BYPASS GRAFT N/A 01/09/2014   Procedure: CORONARY ARTERY BYPASS GRAFTING (CABG) x 5 using left internal mammary artery and right leg greater saphenous vein harvested endoscopically;  Surgeon: Ivin Poot, MD;  Location: [facility];  Service: Open Heart Surgery;  Laterality: N/A;  please use bed extenders and breast binder  . INTRAOPERATIVE TRANSESOPHAGEAL ECHOCARDIOGRAM N/A 01/09/2014   Procedure: INTRAOPERATIVE TRANSESOPHAGEAL ECHOCARDIOGRAM;   Surgeon: Ivin Poot, MD;  Location: [facility];  Service: Open Heart Surgery;  Laterality: N/A;  . KNEE ARTHROSCOPY  1991  . KNEE SURGERY Bilateral 1999  . LEFT HEART CATHETERIZATION WITH CORONARY ANGIOGRAM N/A 01/04/2014   Procedure: LEFT HEART CATHETERIZATION WITH CORONARY ANGIOGRAM;  Surgeon: Sinclair Grooms, MD;  Location: [facility] CATH LAB;  Service: Cardiovascular;  Laterality: N/A;  . LESION EXCISION WITH COMPLEX REPAIR N/A 05/07/2016   Procedure: COMPLEX REPAIR OF CHEST 16 CM;  Surgeon: Irene Limbo, MD;  Location: [facility];  Service: Plastics;  Laterality: N/A;  . MULTIPLE EXTRACTIONS WITH ALVEOLOPLASTY N/A 04/11/2014   Procedure: Extraction of tooth #'s 1,2,8,16 with alveoloplasty, maxillary tuberosity reductions, and gross debridement of remaining teeth.;  Surgeon: Lenn Cal, DDS;  Location: [facility];  Service: Oral Surgery;  Laterality: N/A;  . NM MYOCAR PERF WALL MOTION  01/2010   dipyridamole myoview - moderate perfusion defect in basal inferoseptal, basal inferior, mid inferoseptal, mid inferior, apical inferior region; EF 56%  . TEE WITHOUT CARDIOVERSION  01/2010   EF 60-65%, small, flat, non-infiltrating, calcified, fixed apical/septal mass  . TOTAL KNEE ARTHROPLASTY Left 04/29/2011  . transanal hemorrhoidal dearterliaization  01/06/12   with external hemorrhoid removal  . UNILATERAL SALPINGECTOMY Right 06/03/2014   Procedure: UNILATERAL SALPINGECTOMY;  Surgeon: Lavonia Drafts, MD;  Location: [facility];  Service: Gynecology;  Laterality: Right;  Marland Kitchen VAGINAL HYSTERECTOMY N/A 06/03/2014   Procedure: HYSTERECTOMY VAGINAL;  Surgeon: Lavonia Drafts, MD;  Location: [facility];  Service: Gynecology;  Laterality: N/A;  . WISDOM TOOTH EXTRACTION      Social History: She is retired.  She is single.  She has 1 daughter and 1 son.  She is a non-smoker.  No alcohol use.  No drug use.  Family History: family history includes Anxiety disorder in her son; Bipolar disorder in her son;  Diabetes in her mother and sister; Drug abuse in her brother; Heart attack (age of onset: 80) in her mother; Heart disease in her mother and sister; Heart failure in her sister; Hypertension in her daughter, mother, and sister; Kidney disease in her brother and sister; Schizophrenia in her son.   Allergies  Allergen Reactions  . Cephalosporins Anaphylaxis, Swelling and Other (See Comments)    Tongue swelling, gum pain  . Eicosapentaenoic Acid (Epa) Anaphylaxis and Shortness Of Breath  . Fish-Derived Products Anaphylaxis and Shortness Of Breath  . Peanuts [Peanut Oil] Anaphylaxis, Shortness Of Breath and Other (See Comments)    Peanut butter  . Shrimp [Shellfish Allergy] Anaphylaxis    Pt states her throat will swell if she eats shrimp.  . Latex Itching  . Metronidazole Other (See Comments)    Palpitations, mild SOB, metallic taste, dry mouth, high blood pressure  . Ciprofloxacin Other (See Comments)    Gaging and achy  . Lisinopril Cough       .  Naproxen Nausea Only and Other (See Comments)    Headache  . Other Itching and Other (See Comments)     All pain meds make her itch - has to have something to prevent that in addition to receiving med  . Prednisone Other (See Comments)    Heart beating fast         Outpatient Encounter Medications as of 08/06/2020  Medication Sig  . acetaminophen (TYLENOL) 500 MG tablet Take 1,000 mg by mouth every 8 (eight) hours as needed for mild pain.   Marland Kitchen albuterol (PROVENTIL HFA;VENTOLIN HFA) 108 (90 Base) MCG/ACT inhaler Inhale 2 puffs into the lungs every 6 (six) hours as needed for wheezing or shortness of breath.  Marland Kitchen amLODipine (NORVASC) 5 MG tablet TAKE 1 TABLET BY MOUTH EVERY DAY  . aspirin EC 81 MG tablet Take 81 mg by mouth daily.  . Cholecalciferol (VITAMIN D HIGH POTENCY) 25 MCG (1000 UT) capsule Take 1,000 Units by mouth daily.   . diclofenac Sodium (VOLTAREN) 1 % GEL Apply 2 g topically 4 (four) times daily.  Marland Kitchen esomeprazole (NEXIUM) 20  MG capsule Take 20 mg by mouth every other day.   . fluticasone (FLONASE) 50 MCG/ACT nasal spray PLACE 1 SPRAY INTO BOTH NOSTRILS DAILY. (Patient taking differently: Place 1 spray into both nostrils daily as needed for allergies. )  . losartan (COZAAR) 100 MG tablet Take 1 tablet (100 mg total) by mouth daily.  . metoprolol tartrate (LOPRESSOR) 25 MG tablet TAKE 1 AND 1/2 TABLETS BY MOUTH TWICE A DAY  . Multiple Vitamin (MULTIVITAMIN WITH MINERALS) TABS tablet Take 1 tablet by mouth See admin instructions. Take one tablet by mouth on Monday, Wednesday and Fridays  . Polyethyl Glycol-Propyl Glycol (SYSTANE FREE OP) Apply 1 tablet to eye daily as needed (For dry eyes).  . rosuvastatin (CRESTOR) 20 MG tablet TAKE 1 TABLET BY MOUTH EVERY DAY  . [DISCONTINUED] naproxen sodium (ALEVE) 220 MG tablet Take 220 mg by mouth 2 (two) times daily as needed. (Patient not taking: Reported on 06/16/2020)   No facility-administered encounter medications on file as of 08/06/2020.     REVIEW OF SYSTEMS:  Gen:+ intentional weight loss.  + fatigue. Denies fever, sweats or chills.  CV: + history of a heart murmur. Denies chest pain, palpitations or edema. Resp: Denies cough, shortness of breath of hemoptysis.  GI: See HPI. GU : + urinary leakage.  MS: + arthritis, back and knee pain.  Derm: Denies rash, itchiness, skin lesions or unhealing ulcers. Psych: Denies depression, anxiety, memory loss, suicidal ideation and confusion. Heme: Denies bruising, bleeding. Neuro:  Denies headaches, dizziness or paresthesias. Endo:  Denies any problems with DM, thyroid or adrenal function.    PHYSICAL EXAM: BP 124/68   Pulse 60   Ht 5\' 8"  (1.727 m)   Wt 293 lb 3.2 oz (133 kg)   LMP 01/13/2014 Comment: spotting in Feb and bleeding since April  BMI 44.58 kg/m    General: Well developed 64 year old female in no acute distress. Head: Normocephalic and atraumatic. Eyes:  Sclerae non-icteric, conjunctive pink. Ears:  Normal auditory acuity. Mouth: Upper bridge in place. No ulcers or lesions.  Neck: Supple, no lymphadenopathy or thyromegaly.  Lungs: Clear bilaterally to auscultation without wheezes, crackles or rhonchi. Heart: Regular rate and rhythm. No murmur, rub or gallop appreciated.  Abdomen: Soft, nontender, non distended. No masses. No hepatosplenomegaly. Normoactive bowel sounds x 4 quadrants.  Rectal: Deferred.  Musculoskeletal: Symmetrical with no gross deformities. Skin:  Warm and dry. No rash or lesions on visible extremities. Extremities: No edema. Neurological: Alert oriented x 4, no focal deficits.  Psychological:  Alert and cooperative. Normal mood and affect.  ASSESSMENT AND PLAN:  78.  64 year old female with change of bowel pattern, constipation. -Metamucil daily as tolerated -MiraLAX nightly as needed -CBC, TSH -Complete FOBT is ordered by PCP -Follow-up with Dr. Havery Moros in 2 months  2.  History of tubular adenomatous colon polyps.  Colonoscopy 2018: three (3-35mm)  tubular adenomatous polyps were removed from the colon. -Current colonoscopy recall is July 2023.  To consider an earlier colonoscopy if her laboratory studies show any evidence of anemia if her FOBT is positive or symptoms persist or worsen.  Dr. Havery Moros to verify if an earlier colonoscopy to be done at the time of her scheduled follow-up appointment  3. GERD.  -Increase Esomeprazole 20 mg daily -Consider EGD at time of next colonoscopy -Continue weight loss encouraged  4.  Fleeting upper and lower abdominal pains.  No abdominal tenderness on exam today.  No current abdominal pain. -Patient will continue to monitor her abdominal pains.  She will call our office if her symptoms worsen.  I discussed scheduling an abdominal/pelvic CT if her abdominal pains worsen.    CC:  Axel Filler,*

## 2020-08-06 NOTE — Patient Instructions (Addendum)
If you are age 66 or older, your body mass index should be between 23-30. Your Body mass index is 44.58 kg/m. If this is out of the aforementioned range listed, please consider follow up with your Primary Care Provider.  If you are age 31 or younger, your body mass index should be between 19-25. Your Body mass index is 44.58 kg/m. If this is out of the aformentioned range listed, please consider follow up with your Primary Care Provider.   1.Take Miralax 1 capful mixed in 8 ounces of water at bed time for constipation as tolerated. 2. Take metamucil daily as tolerated  3. Complete the stool study ordered by your Primary Care Physician 4. Follow up with Dr Havery Moros 10/21/2020 @10 :10- Please arrive 15 minutes early. 5. Call the office if your symptoms worsen.   Your provider has requested that you go to the basement level for lab work before leaving today. Press "B" on the elevator. The lab is located at the first door on the left as you exit the elevator.    Due to recent changes in healthcare laws, you may see the results of your imaging and laboratory studies on MyChart before your provider has had a chance to review them.  We understand that in some cases there may be results that are confusing or concerning to you. Not all laboratory results come back in the same time frame and the provider may be waiting for multiple results in order to interpret others.  Please give [place] 48 hours in order for your provider to thoroughly review all the results before contacting the office for clarification of your results.    Thank you for choosing [facility] Noralyn Pick, CRNP  [phone]

## 2020-08-07 ENCOUNTER — Telehealth: Payer: Self-pay | Admitting: General Surgery

## 2020-08-07 NOTE — Telephone Encounter (Signed)
-----   Message from Noralyn Pick, NP sent at 08/06/2020  7:48 PM EDT ----- [name], pls inform the patient her labs were normal. Thx

## 2020-08-07 NOTE — Telephone Encounter (Signed)
Notified the patient labs were normal, patient verbalized understanding.

## 2020-08-08 NOTE — Progress Notes (Signed)
Agree with assessment with the following thoughts: She had 3 very small adenomas on last colonoscopy in 2018. Guidelines recommend a follow up between 3 and 5 years. Given the small size of her polyps I thought 5 years was reasonable. I would think it unlikely she has a high risk lesion in her colon at this time, however given her bowel symptoms, if she would be more comfortable proceeding with colonoscopy now we can do that, if you want to offer it to her. Thanks

## 2020-08-14 ENCOUNTER — Telehealth: Payer: Self-pay

## 2020-08-14 NOTE — Telephone Encounter (Signed)
Spoke with patient to schedule colonoscopy, pt denies any blood thinners, non diabetic, has had both COVID vaccines. Pt is scheduled for a pre visit on 09/03/20 at 8 am, pt aware that she will check in on the 3rd floor. Pt is scheduled for colonoscopy on 09/15/20 at 9:30 am, arrival time 8:30 am, pt aware that she will need a care partner the day of her procedure. Pt verbalized understanding.

## 2020-08-14 NOTE — Telephone Encounter (Signed)
-----   Message from Marlon Pel, RN sent at 08/13/2020  3:38 PM EDT -----  ----- Message ----- From: Noralyn Pick, NP Sent: 08/13/2020   3:08 PM EDT To: Marlon Pel, RN, Yetta Flock, MD  [facility], can you contact the patient and schedule her for a colonoscopy. I provided the patient with  Dr. Doyne Keel recommendations, see addendum to last office appointment note. She wishes to schedule a colonoscopy at this time.  I discussed the colonoscopy, benefits and risks to include benefits and risks discussed including risk with sedation, risk of bleeding, perforation and infection.   Thank you.      ----- Message ----- From: Yetta Flock, MD Sent: 08/08/2020   7:22 AM EDT To: Noralyn Pick, NP    ----- Message ----- From: Noralyn Pick, NP Sent: 08/07/2020   8:03 AM EDT To: Yetta Flock, MD

## 2020-08-27 ENCOUNTER — Other Ambulatory Visit: Payer: Self-pay

## 2020-08-27 ENCOUNTER — Encounter: Payer: Self-pay | Admitting: Student in an Organized Health Care Education/Training Program

## 2020-08-27 ENCOUNTER — Ambulatory Visit: Payer: Medicare Other | Admitting: Student in an Organized Health Care Education/Training Program

## 2020-08-27 VITALS — BP 127/69 | HR 89 | Temp 98.5°F | Ht 68.0 in | Wt 291.0 lb

## 2020-08-27 DIAGNOSIS — Z0000 Encounter for general adult medical examination without abnormal findings: Secondary | ICD-10-CM

## 2020-08-27 NOTE — Patient Instructions (Addendum)
Things That May Be Affecting Your Health:  Alcohol  Hearing loss  Pain    Depression  Home Safety  Sexual Health   Diabetes Y Lack of physical activity  Stress   Difficulty with daily activities  Loneliness  Tiredness   Drug use  Medicines  Tobacco use   Falls  Motor Vehicle Safety Y Weight   Food choices  Oral Health  Other    YOUR PERSONALIZED HEALTH PLAN : 1. Schedule your next subsequent Medicare Wellness visit in one year 2. Please keep your appointment which was scheduled with Dr. Evette Doffing on 09/08/20 @ 8:15 to discuss your     peripheral neuropathy and joint pain.  We can also give you your flu vaccine at this appointment. 3. Congratulations on wanting to increase your activity level!  I have included some exercise bands with instructions in your personal packet.   Annual Wellness Visit                       Medicare Covered Preventative Screenings and Services  Services & Screenings Men and Women Who How Often Need? Date of Last Service Action  Abdominal Aortic Aneurysm Adults with AAA risk factors Once     Alcohol Misuse and Counseling All Adults Screening once a year if no alcohol misuse. Counseling up to 4 face to face sessions.     Bone Density Measurement  Adults at risk for osteoporosis Once every 2 yrs     Lipid Panel Z13.6 All adults without CV disease Once every 5 yrs     Colorectal Cancer   Stool sample or  Colonoscopy All adults 62 and older   Once every year  Every 10 years  Y  2018  I think will be due July 2021, needed 3 year follow up per chart  Depression All Adults Once a year  Today   Diabetes Screening Blood glucose, post glucose load, or GTT Z13.1  All adults at risk  Pre-diabetics  Once per year  Twice per year     Diabetes  Self-Management Training All adults Diabetics 10 hrs first year; 2 hours subsequent years. Requires Copay     Glaucoma  Diabetics  Family history of glaucoma  African  Americans 37 yrs +  Hispanic Americans 94 yrs + Annually - requires coppay     Hepatitis C Z72.89 or F19.20  High Risk for HCV  Born between 1945 and 1965  Annually  Once     HIV Z11.4 All adults based on risk  Annually btw ages 66 & 3 regardless of risk  Annually > 65 yrs if at increased risk     Lung Cancer Screening Asymptomatic adults aged 57-77 with 30 pack yr history and current smoker OR quit within the last 15 yrs Annually Must have counseling and shared decision making documentation before first screen     Medical Nutrition Therapy Adults with   Diabetes  Renal disease  Kidney transplant within past 3 yrs 3 hours first year; 2 hours subsequent years     Obesity and Counseling All adults Screening once a year Counseling if BMI 30 or higher  Y Today   Tobacco Use Counseling Adults who use tobacco  Up to 8 visits in one year     Vaccines Z23  Hepatitis B  Influenza   Pneumonia  Adults   Once  Once every flu season  Two different vaccines separated by one year     Next Annual  Wellness Visit People with Medicare Every year  Today     [place] Women Who How Often Need  Date of Last Service Action  Mammogram  Z12.31 Women over 98 One baseline ages 53-39. Annually ager 68 yrs+  Y    Pap tests All women Annually if high risk. Every 2 yrs for normal risk women     Screening for cervical cancer with   Pap (Z01.419 nl or Z01.411abnl) &  HPV Z11.51 Women aged 69 to 25 Once every 5 yrs     Screening pelvic and breast exams All women Annually if high risk. Every 2 yrs for normal risk women     Sexually Transmitted Diseases  Chlamydia  Gonorrhea  Syphilis All at risk adults Annually for non pregnant females at increased risk         Fall Prevention in the Home, Adult Falls can cause injuries. They can happen to people of all ages. There are many things you can do to make your home safe and to  help prevent falls. Ask for help when making these changes, if needed. What actions can I take to prevent falls? General Instructions  Use good lighting in all rooms. Replace any light bulbs that burn out.  Turn on the lights when you go into a dark area. Use night-lights.  Keep items that you use often in easy-to-reach places. Lower the shelves around your home if necessary.  Set up your furniture so you have a clear path. Avoid moving your furniture around.  Do not have throw rugs and other things on the floor that can make you trip.  Avoid walking on wet floors.  If any of your floors are uneven, fix them.  Add color or contrast paint or tape to clearly mark and help you see: ? Any grab bars or handrails. ? First and last steps of stairways. ? Where the edge of each step is.  If you use a stepladder: ? Make sure that it is fully opened. Do not climb a closed stepladder. ? Make sure that both sides of the stepladder are locked into place. ? Ask someone to hold the stepladder for you while you use it.  If there are any pets around you, be aware of where they are. What can I do in the bathroom?      Keep the floor dry. Clean up any water that spills onto the floor as soon as it happens.  Remove soap buildup in the tub or shower regularly.  Use non-skid mats or decals on the floor of the tub or shower.  Attach bath mats securely with double-sided, non-slip rug tape.  If you need to sit down in the shower, use a plastic, non-slip stool.  Install grab bars by the toilet and in the tub and shower. Do not use towel bars as grab bars. What can I do in the bedroom?  Make sure that you have a light by your bed that is easy to reach.  Do not use any sheets or blankets that are too big for your bed. They should not hang down onto the floor.  Have a firm chair that has side arms. You can use this for support while you get dressed. What can I do in the kitchen?  Clean up any  spills right away.  If you need to reach something above you, use a strong step stool that has a grab bar.  Keep electrical cords out of the way.  Do not use floor polish or wax that makes floors slippery. If you must use wax, use non-skid floor wax. What can I do with my stairs?  Do not leave any items on the stairs.  Make sure that you have a light switch at the top of the stairs and the bottom of the stairs. If you do not have them, ask someone to add them for you.  Make sure that there are handrails on both sides of the stairs, and use them. Fix handrails that are broken or loose. Make sure that handrails are as long as the stairways.  Install non-slip stair treads on all stairs in your home.  Avoid having throw rugs at the top or bottom of the stairs. If you do have throw rugs, attach them to the floor with carpet tape.  Choose a carpet that does not hide the edge of the steps on the stairway.  Check any carpeting to make sure that it is firmly attached to the stairs. Fix any carpet that is loose or worn. What can I do on the outside of my home?  Use bright outdoor lighting.  Regularly fix the edges of walkways and driveways and fix any cracks.  Remove anything that might make you trip as you walk through a door, such as a raised step or threshold.  Trim any bushes or trees on the path to your home.  Regularly check to see if handrails are loose or broken. Make sure that both sides of any steps have handrails.  Install guardrails along the edges of any raised decks and porches.  Clear walking paths of anything that might make someone trip, such as tools or rocks.  Have any leaves, snow, or ice cleared regularly.  Use sand or salt on walking paths during winter.  Clean up any spills in your garage right away. This includes grease or oil spills. What other actions can I take?  Wear shoes that: ? Have a low heel. Do not wear high heels. ? Have rubber bottoms. ? Are  comfortable and fit you well. ? Are closed at the toe. Do not wear open-toe sandals.  Use tools that help you move around (mobility aids) if they are needed. These include: ? Canes. ? Walkers. ? Scooters. ? Crutches.  Review your medicines with your doctor. Some medicines can make you feel dizzy. This can increase your chance of falling. Ask your doctor what other things you can do to help prevent falls. Where to find more information  Centers for Disease Control and Prevention, STEADI: https://garcia.biz/  [employer] on Aging: BrainJudge.co.uk Contact a doctor if:  You are afraid of falling at home.  You feel weak, drowsy, or dizzy at home.  You fall at home. Summary  There are many simple things that you can do to make your home safe and to help prevent falls.  Ways to make your home safe include removing tripping hazards and installing grab bars in the bathroom.  Ask for help when making these changes in your home. This information is not intended to replace advice given to you by your health care provider. Make sure you discuss any questions you have with your health care provider. Document Revised: 03/01/2019 Document Reviewed: 06/23/2017 Elsevier Patient Education  2020 [place] Maintenance, Female Adopting a healthy lifestyle and getting preventive care are important in promoting health and wellness. Ask your health care provider about:  The right schedule for you to have regular tests  and exams.  Things you can do on your own to prevent diseases and keep yourself healthy. What should I know about diet, weight, and exercise? Eat a healthy diet   Eat a diet that includes plenty of vegetables, fruits, low-fat dairy products, and lean protein.  Do not eat a lot of foods that are high in solid fats, added sugars, or sodium. Maintain a healthy weight Body mass index (BMI) is used to identify weight problems. It estimates body fat based  on height and weight. Your health care provider can help determine your BMI and help you achieve or maintain a healthy weight. Get regular exercise Get regular exercise. This is one of the most important things you can do for your health. Most adults should:  Exercise for at least 150 minutes each week. The exercise should increase your heart rate and make you sweat (moderate-intensity exercise).  Do strengthening exercises at least twice a week. This is in addition to the moderate-intensity exercise.  Spend less time sitting. Even light physical activity can be beneficial. Watch cholesterol and blood lipids Have your blood tested for lipids and cholesterol at 66 years of age, then have this test every 5 years. Have your cholesterol levels checked more often if:  Your lipid or cholesterol levels are high.  You are older than 66 years of age.  You are at high risk for heart disease. What should I know about cancer screening? Depending on your health history and family history, you may need to have cancer screening at various ages. This may include screening for:  Breast cancer.  Cervical cancer.  Colorectal cancer.  Skin cancer.  Lung cancer. What should I know about heart disease, diabetes, and high blood pressure? Blood pressure and heart disease  High blood pressure causes heart disease and increases the risk of stroke. This is more likely to develop in people who have high blood pressure readings, are of African descent, or are overweight.  Have your blood pressure checked: ? Every 3-5 years if you are 70-22 years of age. ? Every year if you are 22 years old or older. Diabetes Have regular diabetes screenings. This checks your fasting blood sugar level. Have the screening done:  Once every three years after age 37 if you are at a normal weight and have a low risk for diabetes.  More often and at a younger age if you are overweight or have a high risk for diabetes. What  should I know about preventing infection? Hepatitis B If you have a higher risk for hepatitis B, you should be screened for this virus. Talk with your health care provider to find out if you are at risk for hepatitis B infection. Hepatitis C Testing is recommended for:  Everyone born from [birth year] through 1965.  Anyone with known risk factors for hepatitis C. Sexually transmitted infections (STIs)  Get screened for STIs, including gonorrhea and chlamydia, if: ? You are sexually active and are younger than 66 years of age. ? You are older than 66 years of age and your health care provider tells you that you are at risk for this type of infection. ? Your sexual activity has changed since you were last screened, and you are at increased risk for chlamydia or gonorrhea. Ask your health care provider if you are at risk.  Ask your health care provider about whether you are at high risk for HIV. Your health care provider may recommend a prescription medicine to help prevent HIV infection.  If you choose to take medicine to prevent HIV, you should first get tested for HIV. You should then be tested every 3 months for as long as you are taking the medicine. Pregnancy  If you are about to stop having your period (premenopausal) and you may become pregnant, seek counseling before you get pregnant.  Take 400 to 800 micrograms (mcg) of folic acid every day if you become pregnant.  Ask for birth control (contraception) if you want to prevent pregnancy. Osteoporosis and menopause Osteoporosis is a disease in which the bones lose minerals and strength with aging. This can result in bone fractures. If you are 13 years old or older, or if you are at risk for osteoporosis and fractures, ask your health care provider if you should:  Be screened for bone loss.  Take a calcium or vitamin D supplement to lower your risk of fractures.  Be given hormone replacement therapy (HRT) to treat symptoms of  menopause. Follow these instructions at home: Lifestyle  Do not use any products that contain nicotine or tobacco, such as cigarettes, e-cigarettes, and chewing tobacco. If you need help quitting, ask your health care provider.  Do not use street drugs.  Do not share needles.  Ask your health care provider for help if you need support or information about quitting drugs. Alcohol use  Do not drink alcohol if: ? Your health care provider tells you not to drink. ? You are pregnant, may be pregnant, or are planning to become pregnant.  If you drink alcohol: ? Limit how much you use to 0-1 drink a day. ? Limit intake if you are breastfeeding.  Be aware of how much alcohol is in your drink. In the [place] one drink equals one 12 oz bottle of beer (355 mL), one 5 oz glass of wine (148 mL), or one 1 oz glass of hard liquor (44 mL). General instructions  Schedule regular health, dental, and eye exams.  Stay current with your vaccines.  Tell your health care provider if: ? You often feel depressed. ? You have ever been abused or do not feel safe at home. Summary  Adopting a healthy lifestyle and getting preventive care are important in promoting health and wellness.  Follow your health care provider's instructions about healthy diet, exercising, and getting tested or screened for diseases.  Follow your health care provider's instructions on monitoring your cholesterol and blood pressure. This information is not intended to replace advice given to you by your health care provider. Make sure you discuss any questions you have with your health care provider. Document Revised: 11/01/2018 Document Reviewed: 11/01/2018 Elsevier Patient Education  2020 [employer].

## 2020-08-27 NOTE — Progress Notes (Signed)
This AWV is being conducted by [facility] only. The patient was located at home and I was located in [facility]. The patient's identity was confirmed using their DOB and current address. The patient or his/her legal guardian has consented to being evaluated through a telephone encounter and understands the associated risks (an examination cannot be done and the patient may need to come in for an appointment) / benefits (allows the patient to remain at home, decreasing exposure to coronavirus). I personally spent 54 minutes conducting the AWV.  Subjective:   [name] is a 64 y.o. female who presents for a Medicare Annual Wellness Visit.  The following items have been reviewed and updated today in the appropriate area in the EMR.   Health Risk Assessment  Height, weight, BMI, and BP Visual acuity if needed Depression screen Fall risk / safety level Advance directive discussion Medical and family history were reviewed and updated Updating list of other providers & suppliers Medication reconciliation, including over the counter medicines Cognitive screen Written screening schedule Risk Factor list Personalized health advice, risky behaviors, and treatment adviceCurrent Social History 08/27/2020    Social History Patient lives alone in a handicap-accessible ground floor apartment which is 1 story. There are not steps up to the entrance the patient uses.   Patient's method of transportation is personal car or family member.  The highest level of education was 2 years of college.  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 [employer] are My grandchildren, daughter, son   Pets : None   Interests / Fun: Plants   Current Stressors: Son with special needs (Bi-polar/schizophrenia) , he is not living with her    Religious / Personal Beliefs: [employer] mentors Restaurant manager, fast food)  SChaplin, RN,BSN             Objective:    Vitals: BP 127/69 (BP Location: Right Arm)   Pulse 89   Temp 98.5 F (36.9 C) Comment (Src): all patient reported during telehealth visit  Ht 5\' 8"  (1.727 m)   Wt 291 lb (132 kg)   LMP 01/13/2014 Comment: spotting in Feb and bleeding since April  SpO2 99%   BMI 44.25 kg/m  Vitals are patient reported  Activities of Daily Living In your present state of health, do you have any difficulty performing the following activities: 08/27/2020 07/14/2020  Hearing? N N  Vision? N Y  Comment - cataract  Difficulty concentrating or making decisions? N Y  Comment - memory  Walking or climbing stairs? Y Y  Comment Uses a cane and a walker -  Dressing or bathing? N N  Comment - takes time  Doing errands, shopping? Y Y  Comment Would like to sign up for SCAT, has requested information -  Some recent data might be hidden    Goals Goals    .  Blood Pressure < 140/90    .  Exercise 3x per week (20 min per time)    .  Increase physical activity (pt-stated)    .  LDL CALC < 130    .  Weight (lb) < 273 lb (123.8 kg) (pt-stated)      7% weight loss       Fall Risk Fall Risk  08/27/2020 07/14/2020 06/16/2020 04/07/2020 10/08/2019  Falls in the past year? 0 0 0 0 0  Comment - "some stumbles" - - -  Number falls in past yr: - 0 - 0 -  Injury with Fall? - 0 - 0 -  Risk for fall due to : - Impaired balance/gait;Impaired mobility Impaired balance/gait;Impaired mobility - -  Risk for fall due to: Comment - - - - -  Follow up Falls evaluation completed Falls evaluation completed Education provided;Falls prevention discussed - -    Depression Screen PHQ 2/9 Scores 08/27/2020 07/14/2020 06/16/2020 04/07/2020  PHQ - 2 Score 0 0 0 0  PHQ- 9 Score 2 - 2 -     Cognitive Testing Six-Item Cognitive Screener   "I would like to ask you some questions that ask you to use your memory. I am going to name three objects. Please wait until I say all three words, then repeat them. Remember what  they are  because I am going to ask you to name them again in a few minutes. Please repeat these words for me: APPLE--TABLE--PENNY." (Interviewer may repeat names 3 times if necessary but repetition not scored.)  Did patient correctly repeat all three words? Yes - may proceed with screen  What year is this? Correct What month is this? Correct What day of the week is this? Correct  What were the three objects I asked you to remember? . Apple Correct . Table Correct . Penny Correct  Score one point for each incorrect answer.  A score of 2 or more points warrants additional investigation.  Patient's score 0     Assessment and Plan:   The patient c/o worsening peripheral neuropathy to her feet and joint pain in her hands.  She was given an appointment with her PCP on 09/08/20 and was informed she could receive her flu vaccine on this date as well.  During the course of the visit the patient was educated and counseled about appropriate screening and preventive services as documented in the assessment and plan.  The printed AVS was mailed to the patient and included an updated screening schedule, a list of risk factors, and personalized health advice.        Higinio Roger, RN  08/27/2020

## 2020-08-28 NOTE — Progress Notes (Signed)
Internal Medicine Clinic Attending  Case discussed with [facility].  We reviewed the AWV findings.  I agree with the assessment, diagnosis, and plan of care documented in the AWV note.

## 2020-09-03 ENCOUNTER — Other Ambulatory Visit: Payer: Self-pay

## 2020-09-03 ENCOUNTER — Ambulatory Visit (AMBULATORY_SURGERY_CENTER): Payer: Self-pay | Admitting: *Deleted

## 2020-09-03 VITALS — Ht 68.0 in | Wt 288.0 lb

## 2020-09-03 DIAGNOSIS — K5900 Constipation, unspecified: Secondary | ICD-10-CM

## 2020-09-03 DIAGNOSIS — Z86010 Personal history of colonic polyps: Secondary | ICD-10-CM

## 2020-09-03 MED ORDER — SUPREP BOWEL PREP KIT 17.5-3.13-1.6 GM/177ML PO SOLN: 1.0000 | Freq: Once | ORAL | 0 refills | Status: AC

## 2020-09-03 NOTE — Progress Notes (Signed)
Completed covid vaccines 05-05-20  Pt is aware that care partner will wait in the car during procedure; if they feel like they will be too hot or cold to wait in the car; they may wait in the 4 th floor lobby. Patient is aware to bring only one care partner. We want them to wear a mask (we do not have any that we can provide them), practice social distancing, and we will check their temperatures when they get here.  I did remind the patient that their care partner needs to stay in the parking lot the entire time and have a cell phone available, we will call them when the pt is ready for discharge. Patient will wear mask into building.   Pt has PONV, difficulty with intubation or hx/fam hx of malignant hyperthermia per pt   No egg or soy allergy  No home oxygen use   No medications for weight loss taken  Pt has constipation issues- 2 day Suprep and Miralax prep given

## 2020-09-08 ENCOUNTER — Encounter: Payer: Self-pay | Admitting: Student in an Organized Health Care Education/Training Program

## 2020-09-08 ENCOUNTER — Ambulatory Visit (INDEPENDENT_AMBULATORY_CARE_PROVIDER_SITE_OTHER): Payer: Medicare Other | Admitting: Student in an Organized Health Care Education/Training Program

## 2020-09-08 ENCOUNTER — Other Ambulatory Visit: Payer: Self-pay

## 2020-09-08 VITALS — BP 138/61 | HR 50 | Temp 97.9°F | Ht 68.0 in | Wt 294.0 lb

## 2020-09-08 DIAGNOSIS — G609 Hereditary and idiopathic neuropathy, unspecified: Secondary | ICD-10-CM | POA: Diagnosis not present

## 2020-09-08 DIAGNOSIS — M2142 Flat foot [pes planus] (acquired), left foot: Secondary | ICD-10-CM | POA: Diagnosis not present

## 2020-09-08 DIAGNOSIS — M2012 Hallux valgus (acquired), left foot: Secondary | ICD-10-CM | POA: Diagnosis not present

## 2020-09-08 DIAGNOSIS — M2010 Hallux valgus (acquired), unspecified foot: Secondary | ICD-10-CM | POA: Insufficient documentation

## 2020-09-08 DIAGNOSIS — B354 Tinea corporis: Secondary | ICD-10-CM | POA: Diagnosis not present

## 2020-09-08 MED ORDER — CLOTRIMAZOLE 1 % EX OINT
TOPICAL_OINTMENT | CUTANEOUS | 0 refills | Status: DC
Start: 2020-09-08 — End: 2021-07-10

## 2020-09-08 NOTE — Progress Notes (Signed)
   Assessment and Plan:  See Encounters tab for problem-based medical decision making.   __________________________________________________________  HPI:   64 year old person living with hypertension and obesity here for an acute visit of pain in her left foot.  She reports that for several weeks she is having increasing burning type pain in the left medial foot, on the top of the foot and radiating down to the sole of the foot.  Not function limiting right now.  She reports having a long history of being flat-footed and feels like her foot is now bending more inward, says she walks like a duck.  No fevers or chills.  No recent falls.  Today also describes persistent rash on her right lower leg.  Says that it is very itchy, some over-the-counter creams have been a little bit helpful.  Aloe vera is helpful.  However the itchiness is bad enough to wake her up at night.  __________________________________________________________  Problem List: Patient Active Problem List   Diagnosis Date Noted  . Atherosclerosis of aorta ([place]) 06/20/2015    Priority: High  . Coronary atherosclerosis of native coronary artery 01/05/2014    Priority: High  . Osteoarthritis of right knee 11/07/2014    Priority: Medium  . Morbid obesity due to excess calories ([place]) 05/10/2012    Priority: Medium  . Obstructive sleep apnea 03/27/2010    Priority: Medium  . Essential hypertension 06/30/2007    Priority: Medium  . Glucose intolerance (impaired glucose tolerance) 07/14/2020    Priority: Low  . Vitamin D deficiency 07/14/2020    Priority: Low  . Depression 10/16/2018    Priority: Low  . Osteoarthritis of left shoulder 10/24/2017    Priority: Low  . Peripheral neuropathy 08/19/2017    Priority: Low  . Health care maintenance 10/09/2014    Priority: Low  . GERD 08/06/2008    Priority: Low  . Hyperlipidemia 10/31/2007    Priority: Low  . Pes planus of left foot 09/08/2020  . Hallux valgus 09/08/2020    . Tinea corporis 09/08/2020    Medications: Reconciled today in Epic __________________________________________________________  Physical Exam:  Vital Signs: Vitals:   09/08/20 0828 09/08/20 0841  BP: (!) 159/66 138/61  Pulse: (!) 55 (!) 50  Temp: 97.9 F (36.6 C)   TempSrc: Oral   SpO2: 100%   Weight: 294 lb (133.4 kg)   Height: 5\' 8"  (1.727 m)     Gen: Well appearing, NAD Ext: Left foot has pes planus with loss of the medial arch upon standing, there is a mild hallux valgus on the left side, good peripheral pulses Neuro: She has mild reduced sensation to monofilament exam on the left foot especially in the medial side involving the great toe and along the medial arch Skin: Appears to be some friction changes along the medial foot probably from rubbing on her shoe.  On the right medial leg there is a 3 cm annular rash with satellite papules.

## 2020-09-08 NOTE — Patient Instructions (Signed)
It was great seeing you today in clinic.  We talked about your left foot pain which I think is due to a combination of neuropathy and a flat arch.  I have referred you to [facility] to be fitted for orthotics and consider other treatments.  They can also talk to about treatment options for the bunion on your left foot.  The rash on your right leg today looks like a fungal infection.  I have prescribed a antifungal cream.  Please avoid using any other creams like bacitracin for now.

## 2020-09-08 NOTE — Assessment & Plan Note (Signed)
Mild left-sided hallux valgus which is likely asymptomatic.  Plan is referral to podiatry as she wants to discuss treatment options.  We talked about wide toe box shoes.

## 2020-09-08 NOTE — Assessment & Plan Note (Signed)
Patient with a history of an idiopathic peripheral neuropathy, left worse than right, since at least 2019.  Work-up at that time including neurology referral was not revealing of an underlying cause.  She does not have diabetes.  No significant pain now to require gabapentin.  Plan will be for continued monitoring.

## 2020-09-08 NOTE — Assessment & Plan Note (Signed)
Progressive left foot pain I think is related to pes planus.  She has an idiopathic left foot neuropathy which is likely contributing here.  Plan is for referral to podiatry to consider orthotic devices.

## 2020-09-08 NOTE — Assessment & Plan Note (Signed)
Several month history of an annular rash on her right lower leg which is very pruritic.  She has found some relief from topical steroids and aloe vera.  Seems to have more satellite lesions today.  Seems more consistent with tinea corporis at this time rather than dermatitis.  I will try treatment with clotrimazole ointment twice daily.

## 2020-09-15 ENCOUNTER — Other Ambulatory Visit: Payer: Self-pay

## 2020-09-15 ENCOUNTER — Encounter: Payer: Self-pay | Admitting: Gastroenterology

## 2020-09-15 ENCOUNTER — Ambulatory Visit (AMBULATORY_SURGERY_CENTER): Payer: Medicare Other | Admitting: Gastroenterology

## 2020-09-15 VITALS — BP 125/71 | HR 50 | Temp 97.9°F | Resp 13 | Ht 68.0 in | Wt 288.0 lb

## 2020-09-15 DIAGNOSIS — K648 Other hemorrhoids: Secondary | ICD-10-CM

## 2020-09-15 DIAGNOSIS — Z86010 Personal history of colonic polyps: Secondary | ICD-10-CM

## 2020-09-15 DIAGNOSIS — K5730 Diverticulosis of large intestine without perforation or abscess without bleeding: Secondary | ICD-10-CM

## 2020-09-15 DIAGNOSIS — D124 Benign neoplasm of descending colon: Secondary | ICD-10-CM | POA: Diagnosis not present

## 2020-09-15 DIAGNOSIS — K5900 Constipation, unspecified: Secondary | ICD-10-CM

## 2020-09-15 DIAGNOSIS — D122 Benign neoplasm of ascending colon: Secondary | ICD-10-CM

## 2020-09-15 DIAGNOSIS — D123 Benign neoplasm of transverse colon: Secondary | ICD-10-CM | POA: Diagnosis not present

## 2020-09-15 DIAGNOSIS — Z1211 Encounter for screening for malignant neoplasm of colon: Secondary | ICD-10-CM | POA: Diagnosis not present

## 2020-09-15 MED ORDER — SODIUM CHLORIDE 0.9 % IV SOLN: 500.0000 mL | Freq: Once | INTRAVENOUS | Status: DC

## 2020-09-15 NOTE — Patient Instructions (Signed)
Read all of the handouts given to you by your recovery room nurse.  Thank-you for choosing [place] for your healthcare needs today.  YOU HAD AN ENDOSCOPIC PROCEDURE TODAY AT [facility]:   Refer to the procedure report that was given to you for any specific questions about what was found during the examination.  If the procedure report does not answer your questions, please call your gastroenterologist to clarify.  If you requested that your care partner not be given the details of your procedure findings, then the procedure report has been included in a sealed envelope for you to review at your convenience later.  YOU SHOULD EXPECT: Some feelings of bloating in the abdomen. Passage of more gas than usual.  Walking can help get rid of the air that was put into your GI tract during the procedure and reduce the bloating. If you had a lower endoscopy (such as a colonoscopy or flexible sigmoidoscopy) you may notice spotting of blood in your stool or on the toilet paper. If you underwent a bowel prep for your procedure, you may not have a normal bowel movement for a few days.  Please Note:  You might notice some irritation and congestion in your nose or some drainage.  This is from the oxygen used during your procedure.  There is no need for concern and it should clear up in a day or so.  SYMPTOMS TO REPORT IMMEDIATELY:   Following lower endoscopy (colonoscopy or flexible sigmoidoscopy):  Excessive amounts of blood in the stool  Significant tenderness or worsening of abdominal pains  Swelling of the abdomen that is new, acute  Fever of 100F or higher   For urgent or emergent issues, a gastroenterologist can be reached at any hour by calling [phone]. Do not use MyChart messaging for urgent concerns.    DIET:  We do recommend a small meal at first, but then you may proceed to your regular diet.  Drink plenty of fluids but you should avoid alcoholic beverages for 24 hours. Try to  increase the fiber in your diet, and drink plenty of water.  ACTIVITY:  You should plan to take it easy for the rest of today and you should NOT DRIVE or use heavy machinery until tomorrow (because of the sedation medicines used during the test).    FOLLOW UP: Our staff will call the number listed on your records 48-72 hours following your procedure to check on you and address any questions or concerns that you may have regarding the information given to you following your procedure. If we do not reach you, we will leave a message.  We will attempt to reach you two times.  During this call, we will ask if you have developed any symptoms of COVID 19. If you develop any symptoms (ie: fever, flu-like symptoms, shortness of breath, cough etc.) before then, please call [phone].  If you test positive for Covid 19 in the 2 weeks post procedure, please call and report this information to [place].    If any biopsies were taken you will be contacted by phone or by letter within the next 1-3 weeks.  Please call us at [phone] if you have not heard about the biopsies in 3 weeks.    SIGNATURES/CONFIDENTIALITY: You and/or your care partner have signed paperwork which will be entered into your electronic medical record.  These signatures attest to the fact that that the information above on your After Visit Summary has been reviewed  and is understood.  Full responsibility of the confidentiality of this discharge information lies with you and/or your care-partner. 

## 2020-09-15 NOTE — Op Note (Addendum)
[facility] Patient Name: [name] Procedure Date: 09/15/2020 9:14 AM MRN: [id number] Endoscopist: Remo Lipps P. Havery Moros , MD Age: 64 Referring MD:  Date of Birth: 09-23-1956 Gender: Female Account #: [id number] Procedure:                Colonoscopy Indications:              High risk colon cancer surveillance: Personal                            history of colonic polyps - 3 adenomas in 2018,                            change in bowel habits (constipation) Medicines:                Monitored Anesthesia Care Procedure:                Pre-Anesthesia Assessment:                           - Prior to the procedure, a History and Physical                            was performed, and patient medications and                            allergies were reviewed. The patient's tolerance of                            previous anesthesia was also reviewed. The risks                            and benefits of the procedure and the sedation                            options and risks were discussed with the patient.                            All questions were answered, and informed consent                            was obtained. Prior Anticoagulants: The patient has                            taken no previous anticoagulant or antiplatelet                            agents. ASA Grade Assessment: III - A patient with                            severe systemic disease. After reviewing the risks                            and benefits, the patient was deemed in  satisfactory condition to undergo the procedure.                           After obtaining informed consent, the colonoscope                            was passed under direct vision. Throughout the                            procedure, the patient's blood pressure, pulse, and                            oxygen saturations were monitored continuously. The                            Colonoscope was  introduced through the anus and                            advanced to the the cecum, identified by                            appendiceal orifice and ileocecal valve. The                            colonoscopy was performed without difficulty. The                            patient tolerated the procedure well. The quality                            of the bowel preparation was good. The ileocecal                            valve, appendiceal orifice, and rectum were                            photographed. Scope In: 9:22:08 AM Scope Out: 9:52:06 AM Scope Withdrawal Time: 0 hours 24 minutes 50 seconds  Total Procedure Duration: 0 hours 29 minutes 58 seconds  Findings:                 The perianal and digital rectal examinations were                            normal.                           A few small-mouthed diverticula were found in the                            sigmoid colon and ascending colon.                           A 4 mm polyp was found in the transverse colon. The  polyp was sessile. The polyp was removed with a                            cold snare. Resection and retrieval were complete.                           A 3 mm polyp was found in the descending colon. The                            polyp was sessile. The polyp was removed with a                            cold snare. Resection and retrieval were complete.                           Internal hemorrhoids were found during                            retroflexion. The hemorrhoids were small. Scarring                            from prior hemorrhoidectomy noted.                           The colon was tortous. The exam was otherwise                            without abnormality. Complications:            No immediate complications. Estimated blood loss:                            Minimal. Estimated Blood Loss:     Estimated blood loss was minimal. Impression:               - Diverticulosis  in the sigmoid colon and in the                            ascending colon.                           - One 4 mm polyp in the transverse colon, removed                            with a cold snare. Resected and retrieved.                           - One 3 mm polyp in the descending colon, removed                            with a cold snare. Resected and retrieved.                           - Internal hemorrhoids.                           -  Tortous colon                           - The examination was otherwise normal.                           No concerning findings in relation to patient's                            symptoms, continue Miralax daily. Recommendation:           - Patient has a contact number available for                            emergencies. The signs and symptoms of potential                            delayed complications were discussed with the                            patient. Return to normal activities tomorrow.                            Written discharge instructions were provided to the                            patient.                           - Resume previous diet.                           - Continue present medications.                           - Await pathology results.                           - Continue Miralax for constipation and titrate as                            needed Remo Lipps P. Mailynn Everly, MD 09/15/2020 9:58:56 AM This report has been signed electronically.

## 2020-09-15 NOTE — Progress Notes (Signed)
Called to room to assist during endoscopic procedure.  Patient ID and intended procedure confirmed with present staff. Received instructions for my participation in the procedure from the performing physician.  

## 2020-09-15 NOTE — Progress Notes (Signed)
VS by CW  Pt's states no medical or surgical changes since previsit or office visit.  

## 2020-09-15 NOTE — Progress Notes (Signed)
PT taken to PACU. Monitors in place. VSS. Report given to RN. 

## 2020-09-17 ENCOUNTER — Telehealth: Payer: Self-pay | Admitting: *Deleted

## 2020-09-17 NOTE — Telephone Encounter (Signed)
First f/u attempt.  LVM

## 2020-09-22 ENCOUNTER — Encounter: Payer: Self-pay | Admitting: Gastroenterology

## 2020-09-25 ENCOUNTER — Ambulatory Visit (INDEPENDENT_AMBULATORY_CARE_PROVIDER_SITE_OTHER): Payer: Medicare Other | Admitting: Podiatry

## 2020-09-25 ENCOUNTER — Other Ambulatory Visit: Payer: Self-pay

## 2020-09-25 ENCOUNTER — Ambulatory Visit (INDEPENDENT_AMBULATORY_CARE_PROVIDER_SITE_OTHER): Payer: Medicare Other

## 2020-09-25 DIAGNOSIS — M76829 Posterior tibial tendinitis, unspecified leg: Secondary | ICD-10-CM

## 2020-09-25 DIAGNOSIS — G8929 Other chronic pain: Secondary | ICD-10-CM

## 2020-09-25 DIAGNOSIS — G629 Polyneuropathy, unspecified: Secondary | ICD-10-CM | POA: Diagnosis not present

## 2020-09-25 DIAGNOSIS — M2142 Flat foot [pes planus] (acquired), left foot: Secondary | ICD-10-CM

## 2020-09-25 DIAGNOSIS — M2141 Flat foot [pes planus] (acquired), right foot: Secondary | ICD-10-CM | POA: Diagnosis not present

## 2020-09-25 DIAGNOSIS — M79673 Pain in unspecified foot: Secondary | ICD-10-CM

## 2020-09-25 NOTE — Patient Instructions (Signed)
Bunion  A bunion is a bump on the base of the big toe that forms when the bones of the big toe joint move out of position. Bunions may be small at first, but they often get larger over time. They can make walking painful. What are the causes? A bunion may be caused by:  Wearing narrow or pointed shoes that force the big toe to press against the other toes.  Abnormal foot development that causes the foot to roll inward (pronate).  Changes in the foot that are caused by certain diseases, such as rheumatoid arthritis or polio.  A foot injury. What increases the risk? The following factors may make you more likely to develop this condition:  Wearing shoes that squeeze the toes together.  Having certain diseases, such as: ? Rheumatoid arthritis. ? Polio. ? Cerebral palsy.  Having family members who have bunions.  Being born with a foot deformity, such as flat feet or low arches.  Doing activities that put a lot of pressure on the feet, such as ballet dancing. What are the signs or symptoms? The main symptom of a bunion is a noticeable bump on the big toe. Other symptoms may include:  Pain.  Swelling around the big toe.  Redness and inflammation.  Thick or hardened skin on the big toe or between the toes.  Stiffness or loss of motion in the big toe.  Trouble with walking. How is this diagnosed? A bunion may be diagnosed based on your symptoms, medical history, and activities. You may have tests, such as:  X-rays. These allow your health care provider to check the position of the bones in your foot and look for damage to your joint. They also help your health care provider determine the severity of your bunion and the best way to treat it.  Joint aspiration. In this test, a sample of fluid is removed from the toe joint. This test may be done if you are in a lot of pain. It helps rule out diseases that cause painful swelling of the joints, such as arthritis. How is this  treated? Treatment depends on the severity of your symptoms. The goal of treatment is to relieve symptoms and prevent the bunion from getting worse. Your health care provider may recommend:  Wearing shoes that have a wide toe box.  Using bunion pads to cushion the affected area.  Taping your toes together to keep them in a normal position.  Placing a device inside your shoe (orthotics) to help reduce pressure on your toe joint.  Taking medicine to ease pain, inflammation, and swelling.  Applying heat or ice to the affected area.  Doing stretching exercises.  Surgery to remove scar tissue and move the toes back into their normal position. This treatment is rare. Follow these instructions at home: Managing pain, stiffness, and swelling   If directed, put ice on the painful area: ? Put ice in a plastic bag. ? Place a towel between your skin and the bag. ? Leave the ice on for 20 minutes, 2-3 times a day. Activity   If directed, apply heat to the affected area before you exercise. Use the heat source that your health care provider recommends, such as a moist heat pack or a heating pad. ? Place a towel between your skin and the heat source. ? Leave the heat on for 20-30 minutes. ? Remove the heat if your skin turns bright red. This is especially important if you are unable to feel pain,   heat, or cold. You may have a greater risk of getting burned.  Do exercises as told by your health care provider. General instructions  Support your toe joint with proper footwear, shoe padding, or taping as told by your health care provider.  Take over-the-counter and prescription medicines only as told by your health care provider.  Keep all follow-up visits as told by your health care provider. This is important. Contact a health care provider if your symptoms:  Get worse.  Do not improve in 2 weeks. Get help right away if you have:  Severe pain and trouble with walking. Summary  A  bunion is a bump on the base of the big toe that forms when the bones of the big toe joint move out of position.  Bunions can make walking painful.  Treatment depends on the severity of your symptoms.  Support your toe joint with proper footwear, shoe padding, or taping as told by your health care provider. This information is not intended to replace advice given to you by your health care provider. Make sure you discuss any questions you have with your health care provider. Document Revised: 05/15/2018 Document Reviewed: 03/21/2018 [employer] Patient Education  2020 [employer]  

## 2020-09-25 NOTE — Progress Notes (Signed)
Subjective:   Patient ID: [name], female   DOB: 64 y.o.   MRN: [id number]   HPI 64 year old female presents the office today for concerns of bilateral foot pain, numbness which is getting worse to her feet but she also has some numbness into her hands as well.  She previously had a nerve conduction test in thousand 18 which did reveal polyneuropathy-left lower extremity.  She states that she has not seen a neurologist in some time.  She also has foot pain and she has a bunion left foot as well as flatfeet.  She denies recent injury or trauma.  No recent falls.  She has no other concerns today.   NCV with EMG 09/20/2017 Impression: The electrophysiologic findings are most consistent with a chronic sensorimotor polyneuropathy, axon loss in type, affecting the left lower extremity.   Review of Systems  All other systems reviewed and are negative.  Past Medical History:  Diagnosis Date  . Acute urinary retention s/p Foley 01/07/2012  . Allergy   . Anemia   . Asthma   . CAD (coronary artery disease)    a. 12/2013 s/p CABG x 5 Dr. Darcey Nora (LIMA to LAD, SVG to diagonal, SVG to OM1, SVG to OM2, SVG to PDA)  . Carotid arterial disease ([place])    a. 12/2013 Carotid U/S: 1-39% bilat ICA stenosis.  . Carpal tunnel syndrome, bilateral   . GERD (gastroesophageal reflux disease)   . Hemorrhoids, internal, with bleeding & prolapse 12/13/2011  . Hernia, abdominal   . History of blood transfusion    CABG and hysterectomy  . History of kidney stones   . Hyperlipidemia   . Hypertension   . Iron deficiency 12-07-2011  . Keloid scar    a. Sternal Keloid s/p CABG with ongoing pain.  . Morbid obesity ([place])   . Myocardial infarct ([place])    2015  . Neuropathy   . Osteoarthritis   . Osteoporosis   . PONV (postoperative nausea and vomiting)   . Seasonal allergies   . Sleep apnea    no CPAP machine; sleep study 02/2010 REM AHI 61.7/hr, total sleep REM 14.8/hr  . Uterine cancer ([place]) 7/15    clinical stage IA grade 1 endometrioid endometrial cancer    Past Surgical History:  Procedure Laterality Date  . BREAST REDUCTION SURGERY Bilateral 10/10/2015   Procedure: BILATERAL MAMMARY REDUCTION  (BREAST) WITH FREE NIPPLE GRAFT;  Surgeon: Irene Limbo, MD;  Location: [place];  Service: Plastics;  Laterality: Bilateral;  . CARDIAC CATHETERIZATION    . CATARACT EXTRACTION Right   . COLONOSCOPY  2009  . COLONOSCOPY W/ BIOPSIES AND POLYPECTOMY    . CORONARY ARTERY BYPASS GRAFT N/A 01/09/2014   Procedure: CORONARY ARTERY BYPASS GRAFTING (CABG) x 5 using left internal mammary artery and right leg greater saphenous vein harvested endoscopically;  Surgeon: Ivin Poot, MD;  Location: [place];  Service: Open Heart Surgery;  Laterality: N/A;  please use bed extenders and breast binder  . INTRAOPERATIVE TRANSESOPHAGEAL ECHOCARDIOGRAM N/A 01/09/2014   Procedure: INTRAOPERATIVE TRANSESOPHAGEAL ECHOCARDIOGRAM;  Surgeon: Ivin Poot, MD;  Location: [place];  Service: Open Heart Surgery;  Laterality: N/A;  . KNEE ARTHROSCOPY  1991  . KNEE SURGERY Bilateral 1999  . LEFT HEART CATHETERIZATION WITH CORONARY ANGIOGRAM N/A 01/04/2014   Procedure: LEFT HEART CATHETERIZATION WITH CORONARY ANGIOGRAM;  Surgeon: Sinclair Grooms, MD;  Location: [facility] CATH LAB;  Service: Cardiovascular;  Laterality: N/A;  . LESION EXCISION WITH COMPLEX REPAIR N/A  05/07/2016   Procedure: COMPLEX REPAIR OF CHEST 16 CM;  Surgeon: Irene Limbo, MD;  Location: [place];  Service: Plastics;  Laterality: N/A;  . MULTIPLE EXTRACTIONS WITH ALVEOLOPLASTY N/A 04/11/2014   Procedure: Extraction of tooth #'s 1,2,8,16 with alveoloplasty, maxillary tuberosity reductions, and gross debridement of remaining teeth.;  Surgeon: Lenn Cal, DDS;  Location: [place];  Service: Oral Surgery;  Laterality: N/A;  . NM MYOCAR PERF WALL MOTION  01/2010   dipyridamole myoview - moderate perfusion defect in basal inferoseptal, basal inferior, mid  inferoseptal, mid inferior, apical inferior region; EF 56%  . TEE WITHOUT CARDIOVERSION  01/2010   EF 60-65%, small, flat, non-infiltrating, calcified, fixed apical/septal mass  . TOTAL KNEE ARTHROPLASTY Left 04/29/2011  . transanal hemorrhoidal dearterliaization  01/06/12   with external hemorrhoid removal  . UNILATERAL SALPINGECTOMY Right 06/03/2014   Procedure: UNILATERAL SALPINGECTOMY;  Surgeon: Lavonia Drafts, MD;  Location: [facility];  Service: Gynecology;  Laterality: Right;  Marland Kitchen VAGINAL HYSTERECTOMY N/A 06/03/2014   Procedure: HYSTERECTOMY VAGINAL;  Surgeon: Lavonia Drafts, MD;  Location: [facility];  Service: Gynecology;  Laterality: N/A;  . WISDOM TOOTH EXTRACTION       Current Outpatient Medications:  .  acetaminophen (TYLENOL) 500 MG tablet, Take 1,000 mg by mouth every 8 (eight) hours as needed for mild pain. , Disp: , Rfl:  .  albuterol (PROVENTIL HFA;VENTOLIN HFA) 108 (90 Base) MCG/ACT inhaler, Inhale 2 puffs into the lungs every 6 (six) hours as needed for wheezing or shortness of breath., Disp: 1 Inhaler, Rfl: 0 .  amLODipine (NORVASC) 5 MG tablet, TAKE 1 TABLET BY MOUTH EVERY DAY, Disp: 90 tablet, Rfl: 3 .  aspirin EC 81 MG tablet, Take 81 mg by mouth daily., Disp: , Rfl:  .  Cholecalciferol (VITAMIN D HIGH POTENCY) 25 MCG (1000 UT) capsule, Take 1,000 Units by mouth daily. , Disp: , Rfl:  .  clotrimazole (LOTRIMIN) 1 % cream, Apply 1 application topically 2 (two) times daily., Disp: , Rfl:  .  Clotrimazole 1 % OINT, Apply twice daily to the affected area on the left leg, Disp: 56.7 g, Rfl: 0 .  diclofenac Sodium (VOLTAREN) 1 % GEL, Apply 2 g topically 4 (four) times daily., Disp: 150 g, Rfl: 2 .  esomeprazole (NEXIUM) 20 MG capsule, Take 20 mg by mouth every other day. Taking daily, Disp: , Rfl:  .  fluticasone (FLONASE) 50 MCG/ACT nasal spray, PLACE 1 SPRAY INTO BOTH NOSTRILS DAILY. (Patient taking differently: Place 1 spray into both nostrils daily as needed for  allergies. ), Disp: 16 mL, Rfl: 2 .  losartan (COZAAR) 100 MG tablet, Take 1 tablet (100 mg total) by mouth daily., Disp: 90 tablet, Rfl: 3 .  metoprolol tartrate (LOPRESSOR) 25 MG tablet, TAKE 1 AND 1/2 TABLETS BY MOUTH TWICE A DAY, Disp: 270 tablet, Rfl: 0 .  Multiple Vitamin (MULTIVITAMIN WITH MINERALS) TABS tablet, Take 1 tablet by mouth See admin instructions. Take one tablet by mouth on Monday, Wednesday and Fridays, Disp: , Rfl:  .  Polyethyl Glycol-Propyl Glycol (SYSTANE FREE OP), Apply 1 tablet to eye daily as needed (For dry eyes)., Disp: , Rfl:  .  rosuvastatin (CRESTOR) 20 MG tablet, TAKE 1 TABLET BY MOUTH EVERY DAY, Disp: 90 tablet, Rfl: 3  Allergies  Allergen Reactions  . Cephalosporins Anaphylaxis, Swelling and Other (See Comments)    Tongue swelling, gum pain  . Eicosapentaenoic Acid (Epa) Anaphylaxis and Shortness Of Breath  . Fish-Derived Products Anaphylaxis and Shortness Of  Breath  . Peanuts [Peanut Oil] Anaphylaxis, Shortness Of Breath and Other (See Comments)    Peanut butter  . Shrimp [Shellfish Allergy] Anaphylaxis    Pt states her throat will swell if she eats shrimp.  . Latex Itching  . Metronidazole Other (See Comments)    Palpitations, mild SOB, metallic taste, dry mouth, high blood pressure  . Ciprofloxacin Other (See Comments)    Gaging and achy  . Lisinopril Cough       . Naproxen Nausea Only and Other (See Comments)    Headache  . Other Itching and Other (See Comments)     All pain meds make her itch - has to have something to prevent that in addition to receiving med  . Prednisone Other (See Comments)    Heart beating fast Ok to take low dosage         Objective:  Physical Exam  General: AAO x3, NAD  Dermatological: Skin is warm, dry and supple bilateral.  There are no open sores, no preulcerative lesions, no rash or signs of infection present.  Vascular: Dorsalis Pedis artery and Posterior Tibial artery pedal pulses are 2/4 bilateral with  immedate capillary fill time.There is no pain with calf compression, swelling, warmth, erythema.   Neruologic: Sensation decreased with Semmes Weinstein monofilament.  Musculoskeletal: Decreased medial arch upon weightbearing.  There is tenderness along the course of the posterior tibial tendon, flexor tendons but overall tendons appear to be intact.  There is no significant edema, erythema.  Bunion deformity on the left side with mild tenderness palpation directly on the bunion.  There is no pain or crepitation with first degenerative motion.  There is no other areas of pinpoint tenderness identified today.  Muscular strength 5/5 in all groups tested bilateral.  Gait: Unassisted, Nonantalgic.       Assessment:   64 year old female with symptomatic bunion, flatfoot deformity/PTTD; neuropathy    Plan:  -Treatment options discussed including all alternatives, risks, and complications -Etiology of symptoms were discussed -X-rays were obtained and reviewed with the patient.  Bunion deformity evident. There is evidence of acute fracture identified today.  1. Bunion -X-rays did reveal moderate bunion deformity.  Discussed with conservative as well as surgical treatment options.  Conservative discussed Voltaren gel, shoe modifications, arch supports as well as offloading.  Offloading pads were dispensed.  We discussed surgical invention including first metatarsal osteotomy screw fixation.  She is interested in doing this.  2.  Flatfoot, PTTD -Discussed modifications, orthotics.  Referral to physical therapy.  2.  Neuropathy -She has been worsening and spreading to her hands.  Will refer to neurology.  Trula Slade DPM

## 2020-10-13 ENCOUNTER — Ambulatory Visit: Payer: Medicare Other | Attending: Podiatry

## 2020-10-21 ENCOUNTER — Encounter: Payer: Self-pay | Admitting: Gastroenterology

## 2020-10-21 ENCOUNTER — Ambulatory Visit (INDEPENDENT_AMBULATORY_CARE_PROVIDER_SITE_OTHER): Payer: Medicare Other | Admitting: Gastroenterology

## 2020-10-21 VITALS — BP 126/64 | HR 72 | Ht 67.5 in | Wt 289.1 lb

## 2020-10-21 DIAGNOSIS — K219 Gastro-esophageal reflux disease without esophagitis: Secondary | ICD-10-CM | POA: Diagnosis not present

## 2020-10-21 DIAGNOSIS — Z86010 Personal history of colonic polyps: Secondary | ICD-10-CM

## 2020-10-21 DIAGNOSIS — K5900 Constipation, unspecified: Secondary | ICD-10-CM | POA: Diagnosis not present

## 2020-10-21 NOTE — Progress Notes (Signed)
HPI :  64 year old female here for follow-up visit for constipation and reflux.  She was previously seen by Carl Best in September with constipation and reflux.  She was instructed to use Metamucil daily and MiraLAX as needed.  She was also started on Nexium 20 mg to use daily for reflux.  She has a history of colon polyps and was referred for colonoscopy with me, that was done on October 25 as outlined below.  Remarkable findings include 2 small adenomas, diverticulosis, internal hemorrhoids.  No concerning lesions in regards to her bowel symptoms.  She has been continuing the bowel regimen since the colonoscopy.  She states she is changed her diet and now vegetarian and her bowels have been moving much better since the colonoscopy.  She is drinking plenty of water, using Metamucil and MiraLAX as needed.  No problems with her bowels currently.  No abdominal pains.  She has been using Nexium roughly 20 mg once every other day at baseline.  That been working pretty well to control her symptoms.  She is using it over the counter and does not need a prescription for this.  Since changing her diet to vegetarian over the past several months, she has lost a lot of weight and is continuing to lose weight.  She states her max weight was 400 pounds and she is down to 289, with goals for additional weight loss over the rest of this year.  She thinks that has made a big difference in her reflux as well.  No dysphagia.  Her nephew had colon cancer but no other family history of colon cancer or esophageal cancer she is aware of in her family.  She has had a prior EGD in 2013 which showed no evidence of [name]'s esophagus or high risk pathology.   Colonoscopy 09/15/20 - The perianal and digital rectal examinations were normal. - A few small-mouthed diverticula were found in the sigmoid colon and ascending colon. - A 4 mm polyp was found in the transverse colon. The polyp was sessile. The polyp  was removed with a cold snare. Resection and retrieval were complete. - A 3 mm polyp was found in the descending colon. The polyp was sessile. The polyp was removed with a cold snare. Resection and retrieval were complete. - Internal hemorrhoids were found during retroflexion. The hemorrhoids were small. Scarring from prior hemorrhoidectomy noted. - The colon was tortous. The exam was otherwise without abnormality.  Surgical [P], colon, descending and transverse, polyp (2) - TUBULAR ADENOMA (TWO). - NO HIGH GRADE DYSPLASIA OR CARCINOMA.  Repeat colonoscopy in 7 years  EGD 12/09/2011 - benign gastric polyps, otherwise no BE, normal exam   Past Medical History:  Diagnosis Date  . Acute urinary retention s/p Foley 01/07/2012  . Allergy   . Anemia   . Asthma   . CAD (coronary artery disease)    a. 12/2013 s/p CABG x 5 Dr. Darcey Nora (LIMA to LAD, SVG to diagonal, SVG to OM1, SVG to OM2, SVG to PDA)  . Carotid arterial disease ([place])    a. 12/2013 Carotid U/S: 1-39% bilat ICA stenosis.  . Carpal tunnel syndrome, bilateral   . GERD (gastroesophageal reflux disease)   . Hemorrhoids, internal, with bleeding & prolapse 12/13/2011  . Hernia, abdominal   . History of blood transfusion    CABG and hysterectomy  . History of kidney stones   . Hyperlipidemia   . Hypertension   . Iron deficiency 12-07-2011  . Keloid scar  a. Sternal Keloid s/p CABG with ongoing pain.  . Morbid obesity ([place])   . Myocardial infarct ([place])    2015  . Neuropathy   . Osteoarthritis   . Osteoporosis   . PONV (postoperative nausea and vomiting)   . Seasonal allergies   . Sleep apnea    no CPAP machine; sleep study 02/2010 REM AHI 61.7/hr, total sleep REM 14.8/hr  . Uterine cancer ([place]) 7/15   clinical stage IA grade 1 endometrioid endometrial cancer     Past Surgical History:  Procedure Laterality Date  . BREAST REDUCTION SURGERY Bilateral 10/10/2015   Procedure: BILATERAL MAMMARY REDUCTION  (BREAST) WITH  FREE NIPPLE GRAFT;  Surgeon: Irene Limbo, MD;  Location: [place];  Service: Plastics;  Laterality: Bilateral;  . CARDIAC CATHETERIZATION    . CATARACT EXTRACTION Right   . COLONOSCOPY  2009  . COLONOSCOPY W/ BIOPSIES AND POLYPECTOMY    . CORONARY ARTERY BYPASS GRAFT N/A 01/09/2014   Procedure: CORONARY ARTERY BYPASS GRAFTING (CABG) x 5 using left internal mammary artery and right leg greater saphenous vein harvested endoscopically;  Surgeon: Ivin Poot, MD;  Location: [place];  Service: Open Heart Surgery;  Laterality: N/A;  please use bed extenders and breast binder  . INTRAOPERATIVE TRANSESOPHAGEAL ECHOCARDIOGRAM N/A 01/09/2014   Procedure: INTRAOPERATIVE TRANSESOPHAGEAL ECHOCARDIOGRAM;  Surgeon: Ivin Poot, MD;  Location: [place];  Service: Open Heart Surgery;  Laterality: N/A;  . KNEE ARTHROSCOPY  1991  . KNEE SURGERY Bilateral 1999  . LEFT HEART CATHETERIZATION WITH CORONARY ANGIOGRAM N/A 01/04/2014   Procedure: LEFT HEART CATHETERIZATION WITH CORONARY ANGIOGRAM;  Surgeon: Sinclair Grooms, MD;  Location: [facility] CATH LAB;  Service: Cardiovascular;  Laterality: N/A;  . LESION EXCISION WITH COMPLEX REPAIR N/A 05/07/2016   Procedure: COMPLEX REPAIR OF CHEST 16 CM;  Surgeon: Irene Limbo, MD;  Location: [place];  Service: Plastics;  Laterality: N/A;  . MULTIPLE EXTRACTIONS WITH ALVEOLOPLASTY N/A 04/11/2014   Procedure: Extraction of tooth #'s 1,2,8,16 with alveoloplasty, maxillary tuberosity reductions, and gross debridement of remaining teeth.;  Surgeon: Lenn Cal, DDS;  Location: [place];  Service: Oral Surgery;  Laterality: N/A;  . NM MYOCAR PERF WALL MOTION  01/2010   dipyridamole myoview - moderate perfusion defect in basal inferoseptal, basal inferior, mid inferoseptal, mid inferior, apical inferior region; EF 56%  . TEE WITHOUT CARDIOVERSION  01/2010   EF 60-65%, small, flat, non-infiltrating, calcified, fixed apical/septal mass  . TOTAL KNEE ARTHROPLASTY Left 04/29/2011  .  transanal hemorrhoidal dearterliaization  01/06/12   with external hemorrhoid removal  . UNILATERAL SALPINGECTOMY Right 06/03/2014   Procedure: UNILATERAL SALPINGECTOMY;  Surgeon: Lavonia Drafts, MD;  Location: [facility];  Service: Gynecology;  Laterality: Right;  Marland Kitchen VAGINAL HYSTERECTOMY N/A 06/03/2014   Procedure: HYSTERECTOMY VAGINAL;  Surgeon: Lavonia Drafts, MD;  Location: [facility];  Service: Gynecology;  Laterality: N/A;  . WISDOM TOOTH EXTRACTION     Family History  Problem Relation Age of Onset  . Heart attack Mother 4  . Heart disease Mother   . Hypertension Mother   . Diabetes Mother   . Hypertension Sister   . Heart failure Sister   . Heart disease Sister   . Diabetes Sister   . Kidney disease Sister   . Kidney disease Brother   . Bipolar disorder Son   . Schizophrenia Son   . Anxiety disorder Son   . Hypertension Daughter   . Esophageal cancer Other        nephew  .  Colon cancer Neg Hx   . Pancreatic cancer Neg Hx   . Stomach cancer Neg Hx   . Liver disease Neg Hx   . Rectal cancer Neg Hx    Social History   Tobacco Use  . Smoking status: Never Smoker  . Smokeless tobacco: Never Used  Vaping Use  . Vaping Use: Never used  Substance Use Topics  . Alcohol use: No    Alcohol/week: 0.0 standard drinks  . Drug use: No   Current Outpatient Medications  Medication Sig Dispense Refill  . acetaminophen (TYLENOL) 500 MG tablet Take 1,000 mg by mouth every 8 (eight) hours as needed for mild pain.     Marland Kitchen albuterol (PROVENTIL HFA;VENTOLIN HFA) 108 (90 Base) MCG/ACT inhaler Inhale 2 puffs into the lungs every 6 (six) hours as needed for wheezing or shortness of breath. 1 Inhaler 0  . amLODipine (NORVASC) 5 MG tablet TAKE 1 TABLET BY MOUTH EVERY DAY 90 tablet 3  . aspirin EC 81 MG tablet Take 81 mg by mouth daily.    . Cholecalciferol (VITAMIN D HIGH POTENCY) 25 MCG (1000 UT) capsule Take 1,000 Units by mouth daily.     . clotrimazole (LOTRIMIN) 1 % cream Apply 1  application topically 2 (two) times daily.    . Clotrimazole 1 % OINT Apply twice daily to the affected area on the left leg 56.7 g 0  . diclofenac Sodium (VOLTAREN) 1 % GEL Apply 2 g topically 4 (four) times daily. 150 g 2  . esomeprazole (NEXIUM) 20 MG capsule Take 20 mg by mouth every other day. Taking daily    . fluticasone (FLONASE) 50 MCG/ACT nasal spray PLACE 1 SPRAY INTO BOTH NOSTRILS DAILY. (Patient taking differently: Place 1 spray into both nostrils daily as needed for allergies. ) 16 mL 2  . losartan (COZAAR) 100 MG tablet Take 1 tablet (100 mg total) by mouth daily. 90 tablet 3  . metoprolol tartrate (LOPRESSOR) 25 MG tablet TAKE 1 AND 1/2 TABLETS BY MOUTH TWICE A DAY 270 tablet 0  . Multiple Vitamin (MULTIVITAMIN WITH MINERALS) TABS tablet Take 1 tablet by mouth See admin instructions. Take one tablet by mouth on Monday, Wednesday and Fridays    . Polyethyl Glycol-Propyl Glycol (SYSTANE FREE OP) Apply 1 tablet to eye daily as needed (For dry eyes).    . rosuvastatin (CRESTOR) 20 MG tablet TAKE 1 TABLET BY MOUTH EVERY DAY 90 tablet 3   No current facility-administered medications for this visit.   Allergies  Allergen Reactions  . Cephalosporins Anaphylaxis, Swelling and Other (See Comments)    Tongue swelling, gum pain  . Eicosapentaenoic Acid (Epa) Anaphylaxis and Shortness Of Breath  . Fish-Derived Products Anaphylaxis and Shortness Of Breath  . Peanuts [Peanut Oil] Anaphylaxis, Shortness Of Breath and Other (See Comments)    Peanut butter  . Shrimp [Shellfish Allergy] Anaphylaxis    Pt states her throat will swell if she eats shrimp.  . Latex Itching  . Metronidazole Other (See Comments)    Palpitations, mild SOB, metallic taste, dry mouth, high blood pressure  . Ciprofloxacin Other (See Comments)    Gaging and achy  . Lisinopril Cough       . Naproxen Nausea Only and Other (See Comments)    Headache  . Other Itching and Other (See Comments)     All pain meds make  her itch - has to have something to prevent that in addition to receiving med  . Prednisone Other (See Comments)  Heart beating fast Ok to take low dosage       Review of Systems: All systems reviewed and negative except where noted in HPI.   Lab Results  Component Value Date   CREATININE 0.73 07/14/2020   BUN 7 (L) 07/14/2020   NA 137 07/14/2020   K 4.0 07/14/2020   CL 99 07/14/2020   CO2 24 07/14/2020    Lab Results  Component Value Date   WBC 6.1 08/06/2020   HGB 12.9 08/06/2020   HCT 38.9 08/06/2020   MCV 85.9 08/06/2020   PLT 207.0 08/06/2020    Lab Results  Component Value Date   ALT 10 10/16/2018   AST 16 10/16/2018   ALKPHOS 46 10/16/2018   BILITOT 0.5 10/16/2018      Physical Exam: BP 126/64 (BP Location: Left Arm, Patient Position: Sitting, Cuff Size: Normal)   Pulse 72   Ht 5' 7.5" (1.715 m) Comment: height measured without shoes  Wt 289 lb 2 oz (131.1 kg)   LMP 01/13/2014 Comment: spotting in Feb and bleeding since April  BMI 44.61 kg/m  Constitutional: Pleasant,well-developed, female in no acute distress. Neurological: Alert and oriented to person place and time. Psychiatric: Normal mood and affect. Behavior is normal.   ASSESSMENT AND PLAN: 64 year old female here for reassessment of the following:  GERD - no alarm symptoms, use of low-dose Nexium every other day has provided good control of symptoms.  We discussed how that her weight loss is also likely contributing to improvement of symptoms and she will continue to try to do this.  We discussed long-term risk benefits of chronic PPI use, use of lowest dose needed to control symptoms over time is recommended.  She will continue to titrate down the Nexium as she tolerates over time.  Do not think she warrants any endoscopy right now.  She agreed and can follow-up as needed for symptoms.  Constipation - continue MiraLAX and Metamucil as needed, doing well on this regimen as well as change in  her diet, she can follow-up as needed for this  History of colon polyps - 2 small adenomas, no high risk lesions, due for repeat colonoscopy surveillance in 7 years.  She agreed  [place] Cellar, MD [facility]

## 2020-10-21 NOTE — Patient Instructions (Addendum)
If you are age 66 or older, your body mass index should be between 23-30. Your Body mass index is 44.61 kg/m. If this is out of the aforementioned range listed, please consider follow up with your Primary Care Provider.  If you are age 40 or younger, your body mass index should be between 19-25. Your Body mass index is 44.61 kg/m. If this is out of the aformentioned range listed, please consider follow up with your Primary Care Provider.    Continue Nexium 20 mg as needed.  Continue Miralax as needed. We will give you a coupon today.  You will be due for a recall colonoscopy in 08-2027. We will send you a reminder in the mail when it gets closer to that time.  Thank you for entrusting me with your care and for choosing [facility], Dr. Cofield Cellar

## 2020-10-28 ENCOUNTER — Other Ambulatory Visit: Payer: Self-pay | Admitting: Student in an Organized Health Care Education/Training Program

## 2020-10-31 ENCOUNTER — Ambulatory Visit: Payer: Medicare Other | Admitting: Neurology

## 2020-11-02 ENCOUNTER — Other Ambulatory Visit: Payer: Self-pay | Admitting: Internal Medicine

## 2020-11-25 ENCOUNTER — Ambulatory Visit (INDEPENDENT_AMBULATORY_CARE_PROVIDER_SITE_OTHER): Payer: Medicare Other | Admitting: Podiatry

## 2020-11-25 ENCOUNTER — Other Ambulatory Visit: Payer: Self-pay

## 2020-11-25 DIAGNOSIS — B351 Tinea unguium: Secondary | ICD-10-CM | POA: Diagnosis not present

## 2020-11-25 DIAGNOSIS — G629 Polyneuropathy, unspecified: Secondary | ICD-10-CM | POA: Diagnosis not present

## 2020-11-25 DIAGNOSIS — G8929 Other chronic pain: Secondary | ICD-10-CM | POA: Diagnosis not present

## 2020-11-25 DIAGNOSIS — M79673 Pain in unspecified foot: Secondary | ICD-10-CM

## 2020-11-25 DIAGNOSIS — M79674 Pain in right toe(s): Secondary | ICD-10-CM

## 2020-11-25 DIAGNOSIS — M2141 Flat foot [pes planus] (acquired), right foot: Secondary | ICD-10-CM

## 2020-11-25 DIAGNOSIS — M2142 Flat foot [pes planus] (acquired), left foot: Secondary | ICD-10-CM

## 2020-11-25 DIAGNOSIS — M79675 Pain in left toe(s): Secondary | ICD-10-CM | POA: Diagnosis not present

## 2020-11-25 DIAGNOSIS — M76829 Posterior tibial tendinitis, unspecified leg: Secondary | ICD-10-CM

## 2020-11-25 MED ORDER — CICLOPIROX 8 % EX SOLN: Freq: Every day | CUTANEOUS | 2 refills | Status: DC

## 2020-11-25 NOTE — Patient Instructions (Signed)
Ciclopirox nail solution What is this medicine? CICLOPIROX (sye kloe PEER ox) NAIL SOLUTION is an antifungal medicine. It used to treat fungal infections of the nails. This medicine may be used for other purposes; ask your health care provider or pharmacist if you have questions. COMMON BRAND NAME(S): [id number], Penlac What should I tell my health care provider before I take this medicine? They need to know if you have any of these conditions:  diabetes mellitus  history of seizures  HIV infection  immune system problems or organ transplant  large areas of burned or damaged skin  peripheral vascular disease or poor circulation  taking corticosteroid medication (including steroid inhalers, cream, or lotion)  an unusual or allergic reaction to ciclopirox, isopropyl alcohol, other medicines, foods, dyes, or preservatives  pregnant or trying to get pregnant  breast-feeding How should I use this medicine? This medicine is for external use only. Follow the directions that come with this medicine exactly. Wash and dry your hands before use. Avoid contact with the eyes, mouth or nose. If you do get this medicine in your eyes, rinse out with plenty of cool tap water. Contact your doctor or health care professional if eye irritation occurs. Use at regular intervals. Do not use your medicine more often than directed. Finish the full course prescribed by your doctor or health care professional even if you think you are better. Do not stop using except on your doctor's advice. Talk to your pediatrician regarding the use of this medicine in children. While this medicine may be prescribed for children as young as 12 years for selected conditions, precautions do apply. Overdosage: If you think you have taken too much of this medicine contact a poison control center or emergency room at once. NOTE: This medicine is only for you. Do not share this medicine with others. What if I miss a dose? If you miss a  dose, use it as soon as you can. If it is almost time for your next dose, use only that dose. Do not use double or extra doses. What may interact with this medicine? Interactions are not expected. Do not use any other skin products without telling your doctor or health care professional. This list may not describe all possible interactions. Give your health care provider a list of all the medicines, herbs, non-prescription drugs, or dietary supplements you use. Also tell them if you smoke, drink alcohol, or use illegal drugs. Some items may interact with your medicine. What should I watch for while using this medicine? Tell your doctor or health care professional if your symptoms get worse. Four to six months of treatment may be needed for the nail(s) to improve. Some people may not achieve a complete cure or clearing of the nails by this time. Tell your doctor or health care professional if you develop sores or blisters that do not heal properly. If your nail infection returns after stopping using this product, contact your doctor or health care professional. What side effects may I notice from receiving this medicine? Side effects that you should report to your doctor or health care professional as soon as possible:  allergic reactions like skin rash, itching or hives, swelling of the face, lips, or tongue  severe irritation, redness, burning, blistering, peeling, swelling, oozing Side effects that usually do not require medical attention (report to your doctor or health care professional if they continue or are bothersome):  mild reddening of the skin  nail discoloration  temporary burning or mild   stinging at the site of application This list may not describe all possible side effects. Call your doctor for medical advice about side effects. You may report side effects to FDA at 1-800-FDA-1088. Where should I keep my medicine? Keep out of the reach of children. Store at room temperature  between 15 and 30 degrees C (59 and 86 degrees F). Do not freeze. Protect from light by storing the bottle in the carton after every use. This medicine is flammable. Keep away from heat and flame. Throw away any unused medicine after the expiration date. NOTE: This sheet is a summary. It may not cover all possible information. If you have questions about this medicine, talk to your doctor, pharmacist, or health care provider.  2020 Elsevier/Gold Standard (2008-02-12 16:49:20)  

## 2020-11-27 ENCOUNTER — Other Ambulatory Visit: Payer: Self-pay

## 2020-11-27 ENCOUNTER — Ambulatory Visit (INDEPENDENT_AMBULATORY_CARE_PROVIDER_SITE_OTHER): Payer: Medicare Other | Admitting: Neurology

## 2020-11-27 ENCOUNTER — Encounter: Payer: Self-pay | Admitting: Neurology

## 2020-11-27 VITALS — BP 159/89 | HR 68 | Ht 68.0 in | Wt 290.0 lb

## 2020-11-27 DIAGNOSIS — G5603 Carpal tunnel syndrome, bilateral upper limbs: Secondary | ICD-10-CM | POA: Diagnosis not present

## 2020-11-27 DIAGNOSIS — R21 Rash and other nonspecific skin eruption: Secondary | ICD-10-CM

## 2020-11-27 DIAGNOSIS — G629 Polyneuropathy, unspecified: Secondary | ICD-10-CM | POA: Diagnosis not present

## 2020-11-27 NOTE — Progress Notes (Signed)
Subjective: 65 year old female presents the office with concerns of nails becoming thick, discolored, elongated she cannot trim them herself.  She does get discomfort in the nails mostly with shoe gear.  She states that overall she is doing somewhat better to the foot.  She still has some numbness to the left side but she has been working some range of motion exercises which has been helping her overall foot pain.  She has no concerns today no significant pain today otherwise. Denies any systemic complaints such as fevers, chills, nausea, vomiting. No acute changes since last appointment, and no other complaints at this time.   Objective: AAO x3, NAD DP/PT pulses palpable bilaterally, CRT less than 3 seconds Nails are hypertrophic, dystrophic, brittle, discolored, elongated 10. No surrounding redness or drainage. Tenderness nails 1-5 bilaterally. No open lesions or pre-ulcerative lesions are identified today. There is decreased medial arch upon weightbearing.  There is no area pinpoint tenderness.  No edema, erythema.  Flexor, extensor tendons.  Intact.  Bunion deformities present.  MMT 5/5. No pain with calf compression, swelling, warmth, erythema  Assessment: 65 year old female with symptomatic onychomycosis; flatfoot deformity, PTTD, neuropathy  Plan: -All treatment options discussed with the patient including all alternatives, risks, complications.  -Toenail sharply debrided x10 without any complications or bleeding. Rx penlac -I discussed with her continue supportive shoes, rehab exercises to help with overall tendinitis, flatfoot deformity. -She is scheduled to follow-up with neurology. -Patient encouraged to call the office with any questions, concerns, change in symptoms.   Vivi Barrack DPM

## 2020-11-27 NOTE — Progress Notes (Signed)
Follow-up Visit   Date: 11/27/20   [name] MRN: [id number] DOB: 14-Aug-1956   Interim History: [name] is a 65 y.o. right-handed female with hypertension, hyperlipidemia, obesity, and GERD returning to the clinic for follow-up of neuropathy.  The patient was accompanied to the clinic by self.  History of present illness: Starting around 2017, she developed bilateral feet pain, much worse in the left foot.  She has sharp pain in the toes and tingling in the balls of the feet, as well numbness.  She wears ace bandage around her left foot, which significantly improves her tingling.  Her numbness is constant and worse when she sits on the toilet too long.  She denies any weakness or falls.  No history of diabetes, heavy alcohol consumption, or family history of neuropathy.  UPDATE 11/27/2020:   She was last seen in February 2019 for bilateral feet paresthesias and diagnosed with neuropathy which was confirmed by EMG.  Labs were normal.   She is here for follow-up visit.  She continues to have numbness involving the entire left foot and mid-foot foot on the right.  She denies weakness, but sometimes her leg feels heavy.  She uses a cane for balance.  She has not had any falls.  She also complains of soreness in the feet and uses orthotics.  More recently, she noticed a skin eruption over the right medial ankle, and was prescribed a topical cream.  She continues to have itching in that area.  Medications:  Current Outpatient Medications on File Prior to Visit  Medication Sig Dispense Refill  . acetaminophen (TYLENOL) 500 MG tablet Take 1,000 mg by mouth every 8 (eight) hours as needed for mild pain.     Marland Kitchen albuterol (PROVENTIL HFA;VENTOLIN HFA) 108 (90 Base) MCG/ACT inhaler Inhale 2 puffs into the lungs every 6 (six) hours as needed for wheezing or shortness of breath. 1 Inhaler 0  . amLODipine (NORVASC) 5 MG tablet TAKE 1 TABLET BY MOUTH EVERY DAY 90 tablet 3  . aspirin EC 81 MG  tablet Take 81 mg by mouth daily.    . Cholecalciferol (VITAMIN D HIGH POTENCY) 25 MCG (1000 UT) capsule Take 1,000 Units by mouth daily.     . ciclopirox (PENLAC) 8 % solution Apply topically at bedtime. Apply over nail and surrounding skin. Apply daily over previous coat. After seven (7) days, may remove with alcohol and continue cycle. 6.6 mL 2  . clotrimazole (LOTRIMIN) 1 % cream Apply 1 application topically 2 (two) times daily.    . Clotrimazole 1 % OINT Apply twice daily to the affected area on the left leg 56.7 g 0  . diclofenac Sodium (VOLTAREN) 1 % GEL Apply 2 g topically 4 (four) times daily. 150 g 2  . esomeprazole (NEXIUM) 20 MG capsule Take 20 mg by mouth every other day. Taking daily    . fluticasone (FLONASE) 50 MCG/ACT nasal spray PLACE 1 SPRAY INTO BOTH NOSTRILS DAILY. (Patient taking differently: Place 1 spray into both nostrils daily as needed for allergies.) 16 mL 2  . losartan (COZAAR) 100 MG tablet Take 1 tablet (100 mg total) by mouth daily. 90 tablet 3  . metoprolol tartrate (LOPRESSOR) 25 MG tablet TAKE 1 AND 1/2 TABLETS BY MOUTH TWICE A DAY 270 tablet 1  . Multiple Vitamin (MULTIVITAMIN WITH MINERALS) TABS tablet Take 1 tablet by mouth See admin instructions. Take one tablet by mouth on Monday, Wednesday and Fridays    . Polyethyl  Glycol-Propyl Glycol (SYSTANE FREE OP) Apply 1 tablet to eye daily as needed (For dry eyes).    . rosuvastatin (CRESTOR) 20 MG tablet TAKE 1 TABLET BY MOUTH EVERY DAY 90 tablet 3   No current facility-administered medications on file prior to visit.    Allergies:  Allergies  Allergen Reactions  . Cephalosporins Anaphylaxis, Swelling and Other (See Comments)    Tongue swelling, gum pain  . Eicosapentaenoic Acid (Epa) Anaphylaxis and Shortness Of Breath  . Fish-Derived Products Anaphylaxis and Shortness Of Breath  . Peanuts [Peanut Oil] Anaphylaxis, Shortness Of Breath and Other (See Comments)    Peanut butter  . Shrimp [Shellfish Allergy]  Anaphylaxis    Pt states her throat will swell if she eats shrimp.  . Latex Itching  . Metronidazole Other (See Comments)    Palpitations, mild SOB, metallic taste, dry mouth, high blood pressure  . Ciprofloxacin Other (See Comments)    Gaging and achy  . Lisinopril Cough       . Naproxen Nausea Only and Other (See Comments)    Headache  . Other Itching and Other (See Comments)     All pain meds make her itch - has to have something to prevent that in addition to receiving med  . Prednisone Other (See Comments)    Heart beating fast Ok to take low dosage      Vital Signs:  BP (!) 159/89   Pulse 68   Ht 5\' 8"  (1.727 m)   Wt 290 lb (131.5 kg)   LMP 01/13/2014 Comment: spotting in Feb and bleeding since April  SpO2 98%   BMI 44.09 kg/m     Neurological Exam: MENTAL STATUS including orientation to time, place, person, recent and remote memory, attention span and concentration, language, and fund of knowledge is normal.  Speech is not dysarthric.  CRANIAL NERVES:  No visual field defects.  Pupils equal round and reactive to light.  Normal conjugate, extra-ocular eye movements in all directions of gaze.  No ptosis.    MOTOR:  Motor strength is 5/5 in all extremities.  No atrophy, fasciculations or abnormal movements.  No pronator drift.  Tone is normal.    MSRs:  Reflexes are 2+/4 throughout, absent at the ankles bilaterally.  SENSORY:  Vibration is reduced to 50-40-% at the ankles, temperature and pin prick is reduced over the feet, worse on the left.   COORDINATION/GAIT:  Normal finger-to- nose-finger. Gait is mildly-wide based, assisted with a cane, mild unsteadiness.   Data: NCS/EMG of the left leg 09/20/2017:The electrophysiologic findings are most consistent with a chronic sensorimotor polyneuropathy, axon loss in type, affecting the left lower extremity.  MRI brain wo contrast 09/29/2014:   1. No acute intracranial abnormality.  2. Stable mild white matter disease,  advanced for age. The finding is nonspecific but can be seen in the setting of chronic microvascular ischemia, a demyelinating process such as multiple sclerosis, vasculitis, complicated migraine headaches, or as the sequelae of a prior infectious or inflammatory process.  [place] 12/26/2017:  Vitamin B12, TSH, copper, SPEP with IFE normal   IMPRESSION/PLAN: 1. Idiopathic peripheral neuropathy with left > right paresthesias, mild progression as expected with the course of the disease  - Prior NCS/EMG reviewed which shows presence of neuropathy  - Mild worsening of numbness on exam  - fortunately, she does not have any pain  - Patient educated on daily foot inspection, fall prevention, and safety precautions around the home.  2. Bilateral hand paresthesias, suspect  carpal tunnel syndrome.  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 Right foot itching and rash ?lichen planus  - Follow-up with PCP  Further recommendations pending results.   Thank you for allowing me to participate in patient's care.  If I can answer any additional questions, I would be pleased to do so.    Sincerely,    Nobuko Gsell K. Posey Pronto, DO

## 2020-11-27 NOTE — Patient Instructions (Signed)
Follow-up with your primary care doctor to evaluate your foot itching  Nerve testing of the arms  ELECTROMYOGRAM AND NERVE CONDUCTION STUDIES (EMG/NCS) INSTRUCTIONS  How to Prepare The neurologist conducting the EMG will need to know if you have certain medical conditions. Tell the neurologist and other EMG lab personnel if you: . Have a pacemaker or any other electrical medical device . Take blood-thinning medications . Have hemophilia, a blood-clotting disorder that causes prolonged bleeding Bathing Take a shower or bath shortly before your exam in order to remove oils from your skin. Don't apply lotions or creams before the exam.  What to Expect You'll likely be asked to change into a hospital gown for the procedure and lie down on an examination table. The following explanations can help you understand what will happen during the exam.  . Electrodes. The neurologist or a technician places surface electrodes at various locations on your skin depending on where you're experiencing symptoms. Or the neurologist may insert needle electrodes at different sites depending on your symptoms.  . Sensations. The electrodes will at times transmit a tiny electrical current that you may feel as a twinge or spasm. The needle electrode may cause discomfort or pain that usually ends shortly after the needle is removed. If you are concerned about discomfort or pain, you may want to talk to the neurologist about taking a short break during the exam.  . Instructions. During the needle EMG, the neurologist will assess whether there is any spontaneous electrical activity when the muscle is at rest - activity that isn't present in healthy muscle tissue - and the degree of activity when you slightly contract the muscle.  He or she will give you instructions on resting and contracting a muscle at appropriate times. Depending on what muscles and nerves the neurologist is examining, he or she may ask you to change  positions during the exam.  After your EMG You may experience some temporary, minor bruising where the needle electrode was inserted into your muscle. This bruising should fade within several days. If it persists, contact your primary care doctor.

## 2020-11-29 DIAGNOSIS — Z1152 Encounter for screening for COVID-19: Secondary | ICD-10-CM | POA: Diagnosis not present

## 2020-12-05 ENCOUNTER — Telehealth: Payer: Self-pay

## 2020-12-05 NOTE — Telephone Encounter (Signed)
Returned call to patient. [place] [facility] Results are not in Epic. She has been advised to call the number she was given for her results. Hubbard Hartshorn, BSN, RN-BC

## 2020-12-05 NOTE — Telephone Encounter (Signed)
Pt is calling regarding her covid results [phone]

## 2020-12-09 ENCOUNTER — Encounter: Payer: Medicare Other | Admitting: Neurology

## 2020-12-23 DIAGNOSIS — H53021 Refractive amblyopia, right eye: Secondary | ICD-10-CM | POA: Diagnosis not present

## 2020-12-23 DIAGNOSIS — H43823 Vitreomacular adhesion, bilateral: Secondary | ICD-10-CM | POA: Diagnosis not present

## 2020-12-23 DIAGNOSIS — H25812 Combined forms of age-related cataract, left eye: Secondary | ICD-10-CM | POA: Diagnosis not present

## 2020-12-23 DIAGNOSIS — H15113 Episcleritis periodica fugax, bilateral: Secondary | ICD-10-CM | POA: Diagnosis not present

## 2020-12-23 DIAGNOSIS — Z961 Presence of intraocular lens: Secondary | ICD-10-CM | POA: Diagnosis not present

## 2020-12-23 DIAGNOSIS — H04123 Dry eye syndrome of bilateral lacrimal glands: Secondary | ICD-10-CM | POA: Diagnosis not present

## 2020-12-30 DIAGNOSIS — H04123 Dry eye syndrome of bilateral lacrimal glands: Secondary | ICD-10-CM | POA: Diagnosis not present

## 2020-12-30 DIAGNOSIS — Z961 Presence of intraocular lens: Secondary | ICD-10-CM | POA: Diagnosis not present

## 2020-12-30 DIAGNOSIS — H25812 Combined forms of age-related cataract, left eye: Secondary | ICD-10-CM | POA: Diagnosis not present

## 2020-12-30 DIAGNOSIS — H15113 Episcleritis periodica fugax, bilateral: Secondary | ICD-10-CM | POA: Diagnosis not present

## 2020-12-30 DIAGNOSIS — H53021 Refractive amblyopia, right eye: Secondary | ICD-10-CM | POA: Diagnosis not present

## 2020-12-30 DIAGNOSIS — H43823 Vitreomacular adhesion, bilateral: Secondary | ICD-10-CM | POA: Diagnosis not present

## 2021-01-06 DIAGNOSIS — H25812 Combined forms of age-related cataract, left eye: Secondary | ICD-10-CM | POA: Diagnosis not present

## 2021-01-06 DIAGNOSIS — H04123 Dry eye syndrome of bilateral lacrimal glands: Secondary | ICD-10-CM | POA: Diagnosis not present

## 2021-01-06 DIAGNOSIS — H53021 Refractive amblyopia, right eye: Secondary | ICD-10-CM | POA: Diagnosis not present

## 2021-01-06 DIAGNOSIS — H15113 Episcleritis periodica fugax, bilateral: Secondary | ICD-10-CM | POA: Diagnosis not present

## 2021-01-06 DIAGNOSIS — Z961 Presence of intraocular lens: Secondary | ICD-10-CM | POA: Diagnosis not present

## 2021-01-06 DIAGNOSIS — H43823 Vitreomacular adhesion, bilateral: Secondary | ICD-10-CM | POA: Diagnosis not present

## 2021-01-07 ENCOUNTER — Telehealth: Payer: Self-pay | Admitting: Internal Medicine

## 2021-01-07 NOTE — Telephone Encounter (Signed)
I would defer to her ophthalmologist - if it is felt to be warranted, then would follow their recommendations.  Dr. Lemmie Evens

## 2021-01-07 NOTE — Telephone Encounter (Signed)
   Pt c/o medication issue:  1. Name of Medication: Aleve  2. How are you currently taking this medication (dosage and times per day)?   3. Are you having a reaction (difficulty breathing--STAT)?   4. What is your medication issue? Pt has an eye infection and her eye doctor prescribed her Aleve, she took it for 2 weeks and her eye doctor told her to tale it again for another 2 weeks. She felt like its too much and would like to check in with Dr. Debara Pickett if that's ok

## 2021-01-07 NOTE — Telephone Encounter (Signed)
Called patient, made aware of MD recommendations. Patient verbalized understanding.

## 2021-01-07 NOTE — Telephone Encounter (Signed)
Called patient, LVM advising that we had received her message, and will send it over to our PharmD and to Dr.Hilty.  Left call back number for patient to call back if any other questions.

## 2021-01-07 NOTE — Telephone Encounter (Signed)
In general we ask patients to avoid using anti-inflammatory meds due to the risk of thromboembolic events.   I usually only recommend that they take for 2-3 days if needed and switch to Tylenol or topical products for further use.  I don't know enough about what type of eye infection she has and if there is anything else they could use, so will defer to Dr. Debara Pickett for recommendation.

## 2021-01-17 ENCOUNTER — Other Ambulatory Visit: Payer: Self-pay

## 2021-01-17 ENCOUNTER — Encounter (HOSPITAL_COMMUNITY): Payer: Self-pay | Admitting: Emergency Medicine

## 2021-01-17 ENCOUNTER — Emergency Department (HOSPITAL_COMMUNITY)
Admission: EM | Admit: 2021-01-17 | Discharge: 2021-01-17 | Disposition: A | Discharge status: Home / Self Care | Payer: Medicare Other | Attending: Emergency Medicine | Admitting: Emergency Medicine

## 2021-01-17 ENCOUNTER — Emergency Department (HOSPITAL_COMMUNITY): Payer: Medicare Other

## 2021-01-17 DIAGNOSIS — Z7982 Long term (current) use of aspirin: Secondary | ICD-10-CM | POA: Diagnosis not present

## 2021-01-17 DIAGNOSIS — R030 Elevated blood-pressure reading, without diagnosis of hypertension: Secondary | ICD-10-CM | POA: Diagnosis present

## 2021-01-17 DIAGNOSIS — Z8542 Personal history of malignant neoplasm of other parts of uterus: Secondary | ICD-10-CM | POA: Insufficient documentation

## 2021-01-17 DIAGNOSIS — Z91010 Allergy to peanuts: Secondary | ICD-10-CM | POA: Diagnosis not present

## 2021-01-17 DIAGNOSIS — I10 Essential (primary) hypertension: Secondary | ICD-10-CM | POA: Diagnosis not present

## 2021-01-17 DIAGNOSIS — I2510 Atherosclerotic heart disease of native coronary artery without angina pectoris: Secondary | ICD-10-CM | POA: Insufficient documentation

## 2021-01-17 DIAGNOSIS — J45909 Unspecified asthma, uncomplicated: Secondary | ICD-10-CM | POA: Diagnosis not present

## 2021-01-17 DIAGNOSIS — Z951 Presence of aortocoronary bypass graft: Secondary | ICD-10-CM | POA: Insufficient documentation

## 2021-01-17 DIAGNOSIS — Z79899 Other long term (current) drug therapy: Secondary | ICD-10-CM | POA: Insufficient documentation

## 2021-01-17 DIAGNOSIS — Z91040 Latex allergy status: Secondary | ICD-10-CM | POA: Insufficient documentation

## 2021-01-17 DIAGNOSIS — R079 Chest pain, unspecified: Secondary | ICD-10-CM | POA: Diagnosis not present

## 2021-01-17 LAB — TROPONIN I (HIGH SENSITIVITY)
Troponin I (High Sensitivity): 4 ng/L (ref <18)
Troponin I (High Sensitivity): 5 ng/L (ref <18)

## 2021-01-17 LAB — CBC
HCT: 41.1 % (ref 36.0–46.0)
Hemoglobin: 13.9 g/dL (ref 12.0–15.0)
MCH: 28.5 pg (ref 26.0–34.0)
MCHC: 33.8 g/dL (ref 30.0–36.0)
MCV: 84.4 fL (ref 80.0–100.0)
Platelets: 219 10*3/uL (ref 150–400)
RBC: 4.87 MIL/uL (ref 3.87–5.11)
RDW: 14.7 % (ref 11.5–15.5)
WBC: 5.5 10*3/uL (ref 4.0–10.5)
nRBC: 0 % (ref 0.0–0.2)

## 2021-01-17 LAB — BASIC METABOLIC PANEL
Anion gap: 10 (ref 5–15)
BUN: 11 mg/dL (ref 8–23)
CO2: 24 mmol/L (ref 22–32)
Calcium: 9.5 mg/dL (ref 8.9–10.3)
Chloride: 103 mmol/L (ref 98–111)
Creatinine, Ser: 0.78 mg/dL (ref 0.44–1.00)
GFR, Estimated: >60 mL/min (ref >60)
Glucose, Bld: 107 mg/dL — ABNORMAL HIGH (ref 70–99)
Potassium: 3.9 mmol/L (ref 3.5–5.1)
Sodium: 137 mmol/L (ref 135–145)

## 2021-01-17 NOTE — ED Notes (Signed)
Pt given graham crackers and juice

## 2021-01-17 NOTE — Discharge Instructions (Signed)
As we discussed, you should hold off on the Aleve.  Closely monitor your blood pressure.  Follow-up with your primary care doctor as well as your cardiology doctor.  Return the emergency department for any chest pain, difficulty breathing, redness or swelling of your arm, vomiting or any other worsening concerning symptoms.

## 2021-01-17 NOTE — ED Triage Notes (Signed)
Patient from home with hypertension.  She states that she woke up with swollen upper arms, states that she is on a new medication.  She has a history of MI and CABG.  She states that she has chest pain all the time, hurting more this morning than usual.  She states that it is going across her chest.  No shortness of breath, no vomiting, she does admit to nausea.

## 2021-01-17 NOTE — ED Provider Notes (Signed)
[facility]ENCY DEPARTMENT Provider Note   CSN: [id number] Arrival date & time: 01/17/21  0343     History Chief Complaint  Patient presents with  . Chest Pain  . Hypertension    [name] is a 65 y.o. female with PMH/o CAD, Asthma, CABG, HLD, HTN, MI (2015) who presents for evaluation of concerns for hypertension.  Patient reports that she woke up at about 2 AM this morning and states that she was having some bilateral arm pain and swelling.  Reports she checked her blood pressure at home and states that it was elevated prompting her ED visit.  Patient also endorses some chest pain.  She states that she has had chest pain on and off since 2015.  She will sometimes get sharp pains to her keloid as well as some dull pains across her chest.  She states that today, she felt like her chest pain was a little bit sharper and she had some nausea.  No diaphoresis, shortness of breath, vomiting.  She does report that when she got up initially, she felt a little wobbly but states that has since dissipated.  She currently states that she does not have any chest pain.  She does report that she gets arm pain in her left upper extremity frequently.  She states it is abnormal for her to have pain in both arms. She does feel like the pain in both arms is better.  She does report that over the last 3 weeks, she has taken Aleve every day for pain with her eye.  She does report that she has been taking her blood pressure medications.  She denies any difficulty breathing, abdominal pain, vomiting, numbness/weakness of her arms or legs.  The history is provided by the patient.       Past Medical History:  Diagnosis Date  . Acute urinary retention s/p Foley 01/07/2012  . Allergy   . Anemia   . Asthma   . CAD (coronary artery disease)    a. 12/2013 s/p CABG x 5 Dr. Darcey Nora (LIMA to LAD, SVG to diagonal, SVG to OM1, SVG to OM2, SVG to PDA)  . Carotid arterial disease ([place])    a.  12/2013 Carotid U/S: 1-39% bilat ICA stenosis.  . Carpal tunnel syndrome, bilateral   . GERD (gastroesophageal reflux disease)   . Hemorrhoids, internal, with bleeding & prolapse 12/13/2011  . Hernia, abdominal   . History of blood transfusion    CABG and hysterectomy  . History of kidney stones   . Hyperlipidemia   . Hypertension   . Iron deficiency 12-07-2011  . Keloid scar    a. Sternal Keloid s/p CABG with ongoing pain.  . Morbid obesity ([place])   . Myocardial infarct ([place])    2015  . Neuropathy   . Osteoarthritis   . Osteoporosis   . PONV (postoperative nausea and vomiting)   . Seasonal allergies   . Sleep apnea    no CPAP machine; sleep study 02/2010 REM AHI 61.7/hr, total sleep REM 14.8/hr  . Uterine cancer ([place]) 7/15   clinical stage IA grade 1 endometrioid endometrial cancer    Patient Active Problem List   Diagnosis Date Noted  . Pes planus of left foot 09/08/2020  . Hallux valgus 09/08/2020  . Tinea corporis 09/08/2020  . Glucose intolerance (impaired glucose tolerance) 07/14/2020  . Vitamin D deficiency 07/14/2020  . Depression 10/16/2018  . Osteoarthritis of left shoulder 10/24/2017  . Peripheral neuropathy 08/19/2017  .  Atherosclerosis of aorta ([place]) 06/20/2015  . Osteoarthritis of right knee 11/07/2014  . Health care maintenance 10/09/2014  . Coronary atherosclerosis of native coronary artery 01/05/2014  . Morbid obesity due to excess calories ([place]) 05/10/2012  . Obstructive sleep apnea 03/27/2010  . GERD 08/06/2008  . Hyperlipidemia 10/31/2007  . Essential hypertension 06/30/2007    Past Surgical History:  Procedure Laterality Date  . BREAST REDUCTION SURGERY Bilateral 10/10/2015   Procedure: BILATERAL MAMMARY REDUCTION  (BREAST) WITH FREE NIPPLE GRAFT;  Surgeon: Irene Limbo, MD;  Location: [facility];  Service: Plastics;  Laterality: Bilateral;  . CARDIAC CATHETERIZATION    . CATARACT EXTRACTION Right   . COLONOSCOPY  2009  . COLONOSCOPY W/ BIOPSIES  AND POLYPECTOMY    . CORONARY ARTERY BYPASS GRAFT N/A 01/09/2014   Procedure: CORONARY ARTERY BYPASS GRAFTING (CABG) x 5 using left internal mammary artery and right leg greater saphenous vein harvested endoscopically;  Surgeon: Ivin Poot, MD;  Location: [facility];  Service: Open Heart Surgery;  Laterality: N/A;  please use bed extenders and breast binder  . INTRAOPERATIVE TRANSESOPHAGEAL ECHOCARDIOGRAM N/A 01/09/2014   Procedure: INTRAOPERATIVE TRANSESOPHAGEAL ECHOCARDIOGRAM;  Surgeon: Ivin Poot, MD;  Location: [facility];  Service: Open Heart Surgery;  Laterality: N/A;  . KNEE ARTHROSCOPY  1991  . KNEE SURGERY Bilateral 1999  . LEFT HEART CATHETERIZATION WITH CORONARY ANGIOGRAM N/A 01/04/2014   Procedure: LEFT HEART CATHETERIZATION WITH CORONARY ANGIOGRAM;  Surgeon: Sinclair Grooms, MD;  Location[facility] C[facility] LAB;  Service: Cardiovascular;  Laterality: N/A;  . LESION EXCISION WITH COMPLEX REPAIR N/A 05/07/2016   Procedure: COMPLEX REPAIR OF CHEST 16 CM;  Surgeon: Irene Limbo, MD;  Location: [facility];  Service: Plastics;  Laterality: N/A;  . MULTIPLE EXTRACTIONS WITH ALVEOLOPLASTY N/A 04/11/2014   Procedure: Extraction of tooth #'s 1,2,8,16 with alveoloplasty, maxillary tuberosity reductions, and gross debridement of remaining teeth.;  Surgeon: Lenn Cal, DDS;  Location: [facility];  Service: Oral Surgery;  Laterality: N/A;  . NM MYOCAR PERF WALL MOTION  01/2010   dipyridamole myoview - moderate perfusion defect in basal inferoseptal, basal inferior, mid inferoseptal, mid inferior, apical inferior region; EF 56%  . TEE WITHOUT CARDIOVERSION  01/2010   EF 60-65%, small, flat, non-infiltrating, calcified, fixed apical/septal mass  . TOTAL KNEE ARTHROPLASTY Left 04/29/2011  . transanal hemorrhoidal dearterliaization  01/06/12   with external hemorrhoid removal  . UNILATERAL SALPINGECTOMY Right 06/03/2014   Procedure: UNILATERAL SALPINGECTOMY;  Surgeon: Lavonia Drafts, MD;  Location: [facility];   Service: Gynecology;  Laterality: Right;  Marland Kitchen VAGINAL HYSTERECTOMY N/A 06/03/2014   Procedure: HYSTERECTOMY VAGINAL;  Surgeon: Lavonia Drafts, MD;  Location: [facility];  Service: Gynecology;  Laterality: N/A;  . WISDOM TOOTH EXTRACTION       OB History    Gravida  2   Para  2   Term  2   Preterm  0   AB  0   Living  2     SAB  0   IAB  0   Ectopic  0   Multiple  0   Live Births              Family History  Problem Relation Age of Onset  . Heart attack Mother 31  . Heart disease Mother   . Hypertension Mother   . Diabetes Mother   . Hypertension Sister   . Heart failure Sister   . Heart disease Sister   . Diabetes Sister   .  Kidney disease Sister   . Kidney disease Brother   . Bipolar disorder Son   . Schizophrenia Son   . Anxiety disorder Son   . Hypertension Daughter   . Esophageal cancer Other        nephew  . Colon cancer Neg Hx   . Pancreatic cancer Neg Hx   . Stomach cancer Neg Hx   . Liver disease Neg Hx   . Rectal cancer Neg Hx     Social History   Tobacco Use  . Smoking status: Never Smoker  . Smokeless tobacco: Never Used  Vaping Use  . Vaping Use: Never used  Substance Use Topics  . Alcohol use: No    Alcohol/week: 0.0 standard drinks  . Drug use: No    Home Medications Prior to Admission medications   Medication Sig Start Date End Date Taking? Authorizing Provider  acetaminophen (TYLENOL) 500 MG tablet Take 1,000 mg by mouth every 8 (eight) hours as needed for mild pain.     [provider]  albuterol (PROVENTIL HFA;VENTOLIN HFA) 108 (90 Base) MCG/ACT inhaler Inhale 2 puffs into the lungs every 6 (six) hours as needed for wheezing or shortness of breath. 12/22/18   Kathi Ludwig, MD  amLODipine (NORVASC) 5 MG tablet TAKE 1 TABLET BY MOUTH EVERY DAY 07/30/20   Hilty, Nadean Corwin, MD  aspirin EC 81 MG tablet Take 81 mg by mouth daily.    [provider]  Cholecalciferol (VITAMIN D HIGH POTENCY) 25 MCG (1000  UT) capsule Take 1,000 Units by mouth daily.     [provider]  ciclopirox (PENLAC) 8 % solution Apply topically at bedtime. Apply over nail and surrounding skin. Apply daily over previous coat. After seven (7) days, may remove with alcohol and continue cycle. 11/25/20   Trula Slade, DPM  clotrimazole (LOTRIMIN) 1 % cream Apply 1 application topically 2 (two) times daily. 09/08/20   [provider]  Clotrimazole 1 % OINT Apply twice daily to the affected area on the left leg 09/08/20   Axel Filler, MD  diclofenac Sodium (VOLTAREN) 1 % GEL Apply 2 g topically 4 (four) times daily. 06/16/20   Axel Filler, MD  esomeprazole (NEXIUM) 20 MG capsule Take 20 mg by mouth every other day. Taking daily    [provider]  fluticasone (FLONASE) 50 MCG/ACT nasal spray PLACE 1 SPRAY INTO BOTH NOSTRILS DAILY. Patient taking differently: Place 1 spray into both nostrils daily as needed for allergies. 07/18/19 08/06/21  Axel Filler, MD  losartan (COZAAR) 100 MG tablet Take 1 tablet (100 mg total) by mouth daily. 11/01/19   Axel Filler, MD  metoprolol tartrate (LOPRESSOR) 25 MG tablet TAKE 1 AND 1/2 TABLETS BY MOUTH TWICE A DAY 11/04/20   Hilty, Nadean Corwin, MD  Multiple Vitamin (MULTIVITAMIN WITH MINERALS) TABS tablet Take 1 tablet by mouth See admin instructions. Take one tablet by mouth on Monday, Wednesday and Fridays    [provider]  Polyethyl Glycol-Propyl Glycol (SYSTANE FREE OP) Apply 1 tablet to eye daily as needed (For dry eyes).    [provider]  rosuvastatin (CRESTOR) 20 MG tablet TAKE 1 TABLET BY MOUTH EVERY DAY 10/28/20   Axel Filler, MD    Allergies    Cephalosporins, Eicosapentaenoic acid (epa), Fish-derived products, Peanuts [peanut oil], Shrimp [shellfish allergy], Latex, Metronidazole, Ciprofloxacin, Lisinopril, Naproxen, Other, and Prednisone  Review of Systems   Review of Systems   Constitutional: Negative for fever.  Respiratory: Negative for cough and shortness of breath.   Cardiovascular: Positive for chest pain.  Gastrointestinal: Negative for abdominal pain, nausea and vomiting.  Genitourinary: Negative for dysuria and hematuria.  Musculoskeletal:       Arm pain  Neurological: Negative for weakness, numbness and headaches.  All other systems reviewed and are negative.   Physical Exam Updated Vital Signs BP 140/69 (BP Location: Right Arm)   Pulse 66   Temp 97.7 F (36.5 C)   Resp 17   LMP 01/13/2014 Comment: spotting in Feb and bleeding since April  SpO2 98%   Physical Exam Vitals and nursing note reviewed.  Constitutional:      Appearance: Normal appearance. She is well-developed and well-nourished.  HENT:     Head: Normocephalic and atraumatic.     Mouth/Throat:     Mouth: Oropharynx is clear and moist and mucous membranes are normal.  Eyes:     General: Lids are normal.     Extraocular Movements: EOM normal.     Conjunctiva/sclera: Conjunctivae normal.     Pupils: Pupils are equal, round, and reactive to light.  Cardiovascular:     Rate and Rhythm: Normal rate and regular rhythm.     Pulses: Normal pulses.          Radial pulses are 2+ on the right side and 2+ on the left side.     Heart sounds: Normal heart sounds. No murmur heard. No friction rub. No gallop.   Pulmonary:     Effort: Pulmonary effort is normal.     Breath sounds: Normal breath sounds.     Comments: Lungs clear to auscultation bilaterally.  Symmetric chest rise.  No wheezing, rales, rhonchi. Chest:     Comments: Keloid scar noted to midsternal chest. No overlying warmth, erythema, edema.  Abdominal:     Palpations: Abdomen is soft. Abdomen is not rigid.     Tenderness: There is no abdominal tenderness. There is no guarding.     Comments: Abdomen is soft, non-distended, non-tender. No rigidity, No guarding. No peritoneal signs.  Musculoskeletal:        General: Normal  range of motion.     Cervical back: Full passive range of motion without pain.     Comments: BUE are symmetric in appearance without any overlying warmth, erythema, edema. BLE are symmetrical.  Without any overlying warmth, erythema, edema  Skin:    General: Skin is warm and dry.     Capillary Refill: Capillary refill takes less than 2 seconds.     Comments: Good distal cap refill. BUE is not dusky in appearance or cool to touch.  Neurological:     Mental Status: She is alert and oriented to person, place, and time.     Comments: Follows commands, Moves all extremities  5/5 strength to BUE and BLE  Sensation intact throughout all major nerve distributions  Psychiatric:        Mood and Affect: Mood and affect normal.        Speech: Speech normal.     ED Results / Procedures / Treatments   Labs (all labs ordered are listed, but only abnormal results are displayed) Labs Reviewed  BASIC METABOLIC PANEL - Abnormal; Notable for the following components:      Result Value   Glucose, Bld 107 (*)    All other components within normal limits  CBC  TROPONIN I (HIGH SENSITIVITY)  TROPONIN I (HIGH SENSITIVITY)    EKG EKG Interpretation  Date/Time:  Saturday January 17 2021 04:38:50 EST Ventricular Rate:  70 PR Interval:  182 QRS Duration: 86 QT Interval:  380 QTC Calculation: 410 R Axis:   7 Text Interpretation: Sinus rhythm with occasional Premature ventricular complexes Anterior infarct , age undetermined Abnormal ECG No significant change since last tracing Confirmed by Calvert Cantor [phone]) on 01/17/2021 7:41:09 AM   Radiology DG Chest 2 View  Result Date: 01/17/2021 CLINICAL DATA:  Chest pain, hypertension EXAM: CHEST - 2 VIEW COMPARISON:  07/13/2019 FINDINGS: Lungs are clear. No pneumothorax or pleural effusion. Coronary artery bypass grafting has been performed. Cardiac size within normal limits. Pulmonary vascularity is normal. Degenerative changes are seen within the  thoracic spine. No acute bone abnormality. IMPRESSION: No active cardiopulmonary disease. Electronically Signed   By: Fidela Salisbury MD   On: 01/17/2021 05:46    Procedures Procedures   Medications Ordered in ED Medications - No data to display  ED Course  I have reviewed the triage vital signs and the nursing notes.  Pertinent labs & imaging results that were available during my care of the patient were reviewed by me and considered in my medical decision making (see chart for details).    MDM Rules/Calculators/A&P                          65 year old female past mixture of hypertension, MI, CABG, who presents for evaluation of hypertension, bilateral arm pain and swelling.  She reports waking up at 2 AM with the symptoms.  She also reports that she has had some chest pain but states that she has had chest pain on and off for about 6 years.  She reports that she normally gets a sharp pain in her keloid scar as well as a dull pain across the chest.  Today, her symptoms felt a little bit more sharp.  No associated vomiting, shortness of breath.  Currently denies any symptoms.  She does tell me she has been taking Aleve for 3 weeks.  Initial arrival, she is afebrile, nontoxic-appearing.  She is slightly hypertensive but vitals otherwise stable.  On exam, she is well-appearing.  Low suspicion for ACS etiology given the fact that she has had chest pain that has been ongoing for the last 6 years.  This does not sound like unstable angina.  But given her history, we will obtain lab work, EKG.  Additionally, history/physical exam not concerning for ischemic limb, septic arthritis.  Bilateral lower extremities do not appear swollen, edematous on exam and do not have any overlying warmth, erythema.  Additionally, she has good pulses, cap refill.  History/physical exam not concerning for dissection, hypertensive emergency.  Trop negative.  CBC negative for any acute abnormalities.  BMP shows normal  BUN/creatinine.  Chest x-ray negative for any acute abnormalities.  Delta troponin is negative.  Patient's blood pressure improved and is now 140/69 here in the ED.  She does not have any complaints.  At this time, patient with 2 - troponins, reassuring work-up.  Her chest pain was atypical in nature.  Do not feel that this is representative of ACS etiology.  Additionally, her arms do not appear swollen, erythematous, infectious on my exam.  She has good pulses bilaterally.  Discussed with Dr. Karle Starch who agrees with plan.  Patient instructed to stop taking Aleve as this could be contributing to higher blood pressures.  Patient instructed follow-up with her PCP as well as cardiology. At this time, patient exhibits no  emergent life-threatening condition that require further evaluation in ED. Strict return precautions discussed. Patient expresses understanding and agreement to plan.   Portions of this note were generated with [employer]. Dictation errors may occur despite best attempts at proofreading.    Final Clinical Impression(s) / ED Diagnoses Final diagnoses:  Hypertension, unspecified type    Rx / DC Orders ED Discharge Orders    None       Desma Mcgregor 01/17/21 [id number]    Truddie Hidden, MD 01/17/21 [phone]

## 2021-01-21 ENCOUNTER — Ambulatory Visit (INDEPENDENT_AMBULATORY_CARE_PROVIDER_SITE_OTHER): Payer: Medicare Other | Admitting: Neurology

## 2021-01-21 ENCOUNTER — Other Ambulatory Visit: Payer: Self-pay

## 2021-01-21 DIAGNOSIS — G5603 Carpal tunnel syndrome, bilateral upper limbs: Secondary | ICD-10-CM

## 2021-01-21 DIAGNOSIS — H53021 Refractive amblyopia, right eye: Secondary | ICD-10-CM | POA: Diagnosis not present

## 2021-01-21 DIAGNOSIS — H15113 Episcleritis periodica fugax, bilateral: Secondary | ICD-10-CM | POA: Diagnosis not present

## 2021-01-21 DIAGNOSIS — H43823 Vitreomacular adhesion, bilateral: Secondary | ICD-10-CM | POA: Diagnosis not present

## 2021-01-21 DIAGNOSIS — G629 Polyneuropathy, unspecified: Secondary | ICD-10-CM

## 2021-01-21 DIAGNOSIS — H10013 Acute follicular conjunctivitis, bilateral: Secondary | ICD-10-CM | POA: Diagnosis not present

## 2021-01-21 DIAGNOSIS — Z961 Presence of intraocular lens: Secondary | ICD-10-CM | POA: Diagnosis not present

## 2021-01-21 DIAGNOSIS — H25812 Combined forms of age-related cataract, left eye: Secondary | ICD-10-CM | POA: Diagnosis not present

## 2021-01-21 DIAGNOSIS — H04123 Dry eye syndrome of bilateral lacrimal glands: Secondary | ICD-10-CM | POA: Diagnosis not present

## 2021-01-21 NOTE — Procedures (Signed)
[facility]  [place], [address]  [place], [place] [address] Tel: [phone] Fax:  [phone] Test Date:  01/21/2021  Patient: [name] DOB: [birth year] Physician: Narda Amber, DO  Sex: Female Height: 5\' 7"  Ref Phys: Narda Amber, DO  ID#: [id number]   Technician:    Patient Complaints: This is a 65 year old female referred for evaluation of bilateral hand numbness and tingling.  NCV & EMG Findings: Extensive electrodiagnostic testing of the right upper extremity and additional studies of the left shows:  1. Bilateral median sensory responses are absent. Right ulnar sensory response shows mildly prolonged latency (3.7 ms).  Left ulnar and bilateral radial sensory responses are within normal limits. 2. Right median motor response shows prolonged latency (8.0 ms) and reduced amplitude (4.4 mV).  Left median motor response shows prolonged latency (5.7 ms).  Bilateral ulnar motor responses are within normal limits.   3. Chronic motor axonal loss changes are seen affecting bilateral abductor pollicis brevis muscles, without accompanied active denervation   Impression: 1. Bilateral median neuropathy at or distal to the wrist, consistent with a clinical diagnosis of carpal tunnel syndrome.  Overall, these findings are severe in degree electrically and worse on the right. 2. Right ulnar sensory neuropathy, very mild mild.   ___________________________ Narda Amber, DO    Nerve Conduction Studies Anti Sensory Summary Table   Stim Site NR Peak (ms) Norm Peak (ms) P-T Amp (V) Norm P-T Amp  Left Median Anti Sensory (2nd Digit)  34C  Wrist NR  <3.8  >10  Right Median Anti Sensory (2nd Digit)  34C  Wrist NR  <3.8  >10  Left Radial Anti Sensory (Base 1st Digit)  34C  Wrist    2.5 <2.8 15.1 >10  Right Radial Anti Sensory (Base 1st Digit)  34C  Wrist    2.3 <2.8 18.8 >10  Left Ulnar Anti Sensory (5th Digit)  34C  Wrist    3.2 <3.2 8.7 >5  Right Ulnar Anti  Sensory (5th Digit)  34C  Wrist    3.7 <3.2 11.9 >5   Motor Summary Table   Stim Site NR Onset (ms) Norm Onset (ms) O-P Amp (mV) Norm O-P Amp Site1 Site2 Delta-0 (ms) Dist (cm) Vel (m/s) Norm Vel (m/s)  Left Median Motor (Abd Poll Brev)  34C  Wrist    5.7 <4.0 5.3 >5 Elbow Wrist 5.5 33.0 60 >50  Elbow    11.2  4.8         Right Median Motor (Abd Poll Brev)  34C  Wrist    8.0 <4.0 4.4 >5 Elbow Wrist 5.0 32.0 64 >50  Elbow    13.0  4.1         Left Ulnar Motor (Abd Dig Minimi)  34C  Wrist    2.3 <3.1 8.7 >7 B Elbow Wrist 4.2 26.0 62 >50  B Elbow    6.5  7.7  A Elbow B Elbow 1.6 10.0 63 >50  A Elbow    8.1  7.4         Right Ulnar Motor (Abd Dig Minimi)  34C  Wrist    2.3 <3.1 8.4 >7 B Elbow Wrist 4.3 27.0 63 >50  B Elbow    6.6  8.1  A Elbow B Elbow 1.5 10.0 67 >50  A Elbow    8.1  7.9          EMG   Side Muscle Ins Act Fibs Psw Fasc Number Recrt Dur  Dur. Amp Amp. Poly Poly. Comment  Right 1stDorInt Nml Nml Nml Nml Nml Nml Nml Nml Nml Nml Nml Nml N/A  Right Abd Poll Brev Nml Nml Nml Nml 2- Rapid Many 1+ Many 1+ Many 1+ N/A  Right PronatorTeres Nml Nml Nml Nml Nml Nml Nml Nml Nml Nml Nml Nml N/A  Right Biceps Nml Nml Nml Nml Nml Nml Nml Nml Nml Nml Nml Nml N/A  Right Triceps Nml Nml Nml Nml Nml Nml Nml Nml Nml Nml Nml Nml N/A  Right Deltoid Nml Nml Nml Nml Nml Nml Nml Nml Nml Nml Nml Nml N/A  Left 1stDorInt Nml Nml Nml Nml Nml Nml Nml Nml Nml Nml Nml Nml N/A  Left Abd Poll Brev Nml Nml Nml Nml 1- Rapid Some 1+ Some 1+ Some 1+ N/A  Left PronatorTeres Nml Nml Nml Nml Nml Nml Nml Nml Nml Nml Nml Nml N/A  Left Biceps Nml Nml Nml Nml Nml Nml Nml Nml Nml Nml Nml Nml N/A  Left Triceps Nml Nml Nml Nml Nml Nml Nml Nml Nml Nml Nml Nml N/A  Left Deltoid Nml Nml Nml Nml Nml Nml Nml Nml Nml Nml Nml Nml N/A      Waveforms:

## 2021-01-22 ENCOUNTER — Telehealth: Payer: Self-pay

## 2021-01-22 DIAGNOSIS — G5603 Carpal tunnel syndrome, bilateral upper limbs: Secondary | ICD-10-CM

## 2021-01-22 NOTE — Telephone Encounter (Signed)
Called patient and informed her of EMG results. Patient is agreeable to have referral sent to [facility].   Referral has been created and faxed to the [facility].Patient is aware that referral has been sent.

## 2021-01-22 NOTE — Telephone Encounter (Signed)
-----   Message from Alda Berthold, DO sent at 01/22/2021 12:36 PM EST ----- Please inform patient that her nerve testing shows bilateral carpal tunnel syndrome, which is severe.  I do not think she will get much relief with wrist braces and probably best to see a hand surgeon for management options.  If agreeable, please refer to the [facility].

## 2021-01-22 NOTE — Telephone Encounter (Signed)
-----   Message from Alda Berthold, DO sent at 01/22/2021 12:36 PM EST ----- Please inform patient that her nerve testing shows bilateral carpal tunnel syndrome, which is severe.  I do not think she will get much relief with wrist braces and probably best to see a hand surgeon for management options.  If agreeable, please refer to the [facility].

## 2021-01-29 ENCOUNTER — Emergency Department (HOSPITAL_COMMUNITY): Payer: Medicare Other

## 2021-01-29 ENCOUNTER — Encounter (HOSPITAL_COMMUNITY): Payer: Self-pay

## 2021-01-29 ENCOUNTER — Emergency Department (HOSPITAL_COMMUNITY)
Admission: EM | Admit: 2021-01-29 | Discharge: 2021-01-29 | Disposition: A | Discharge status: Home / Self Care | Payer: Medicare Other | Attending: Emergency Medicine | Admitting: Emergency Medicine

## 2021-01-29 DIAGNOSIS — I10 Essential (primary) hypertension: Secondary | ICD-10-CM | POA: Insufficient documentation

## 2021-01-29 DIAGNOSIS — Z8542 Personal history of malignant neoplasm of other parts of uterus: Secondary | ICD-10-CM | POA: Diagnosis not present

## 2021-01-29 DIAGNOSIS — I2510 Atherosclerotic heart disease of native coronary artery without angina pectoris: Secondary | ICD-10-CM | POA: Diagnosis not present

## 2021-01-29 DIAGNOSIS — Z91010 Allergy to peanuts: Secondary | ICD-10-CM | POA: Diagnosis not present

## 2021-01-29 DIAGNOSIS — Z91040 Latex allergy status: Secondary | ICD-10-CM | POA: Insufficient documentation

## 2021-01-29 DIAGNOSIS — Z951 Presence of aortocoronary bypass graft: Secondary | ICD-10-CM | POA: Insufficient documentation

## 2021-01-29 DIAGNOSIS — M25551 Pain in right hip: Secondary | ICD-10-CM | POA: Diagnosis not present

## 2021-01-29 DIAGNOSIS — Z7982 Long term (current) use of aspirin: Secondary | ICD-10-CM | POA: Diagnosis not present

## 2021-01-29 DIAGNOSIS — Z96652 Presence of left artificial knee joint: Secondary | ICD-10-CM | POA: Insufficient documentation

## 2021-01-29 DIAGNOSIS — Z743 Need for continuous supervision: Secondary | ICD-10-CM | POA: Diagnosis not present

## 2021-01-29 DIAGNOSIS — J45909 Unspecified asthma, uncomplicated: Secondary | ICD-10-CM | POA: Insufficient documentation

## 2021-01-29 DIAGNOSIS — M25572 Pain in left ankle and joints of left foot: Secondary | ICD-10-CM | POA: Diagnosis not present

## 2021-01-29 DIAGNOSIS — Z79899 Other long term (current) drug therapy: Secondary | ICD-10-CM | POA: Insufficient documentation

## 2021-01-29 DIAGNOSIS — R21 Rash and other nonspecific skin eruption: Secondary | ICD-10-CM | POA: Insufficient documentation

## 2021-01-29 DIAGNOSIS — R52 Pain, unspecified: Secondary | ICD-10-CM

## 2021-01-29 MED ORDER — OXYCODONE-ACETAMINOPHEN 5-325 MG PO TABS
1.0000 | ORAL_TABLET | Freq: Once | ORAL | Status: AC
  Administered 2021-01-29: 1 via ORAL
  Filled 2021-01-29: qty 1

## 2021-01-29 MED ORDER — OXYCODONE-ACETAMINOPHEN 5-325 MG PO TABS: 1.0000 | ORAL_TABLET | ORAL | 0 refills | Status: AC | PRN

## 2021-01-29 MED ORDER — DIPHENHYDRAMINE HCL 25 MG PO CAPS
25.0000 mg | ORAL_CAPSULE | Freq: Once | ORAL | Status: AC
  Administered 2021-01-29: 25 mg via ORAL
  Filled 2021-01-29: qty 1

## 2021-01-29 NOTE — ED Triage Notes (Signed)
Pt presents from home with c/o right hip pain. Pt denies any injury, just reports the pain came out of nowhere.

## 2021-01-29 NOTE — Discharge Instructions (Addendum)
Your x-ray today did not show any fracture or dislocation.  I have prescribed medication to help with your symptoms.Please take this for severe pain.

## 2021-01-29 NOTE — ED Notes (Signed)
Pt waiting on daughter to pick her up. Pt will need assistance getting in the car.

## 2021-01-29 NOTE — ED Provider Notes (Signed)
[name] DEPT Provider Note   CSN: [id number] Arrival date & time: 01/29/21  1015     History Chief Complaint  Patient presents with  . Hip Pain    [name] is a 65 y.o. female.  65 y.o female with a PMH of CAD, Osteoarthritis presents to the ED with a chief complaint of right hip pain x2 days ago.  Patient reports she was on her sewing machine when she slumped over the chair, reports immediate pain to the right hip.  Reports the pain has been worsening since then.  States is exacerbated with standing from sitting, does report using a cane along with a walker at home but has been unable to ambulate from her baseline.  States yesterday she had an episode where she could not stop screaming due to pain on her right hip.  In addition, patient has a rash to her right ankle, reports this pruritic in nature, has been ongoing for the last 2 days, she been applying Neosporin without improvement in her itching or pain.  Denies any fever, fall, other complaints.  No bowel or bladder incontinence.  The history is provided by the patient.  Hip Pain This is a new problem. The current episode started more than 2 days ago. The problem occurs constantly. The problem has been gradually worsening. Pertinent negatives include no chest pain, no abdominal pain, no headaches and no shortness of breath. The symptoms are aggravated by standing. Nothing relieves the symptoms. She has tried rest, a warm compress and a cold compress for the symptoms.       Past Medical History:  Diagnosis Date  . Acute urinary retention s/p Foley 01/07/2012  . Allergy   . Anemia   . Asthma   . CAD (coronary artery disease)    a. 12/2013 s/p CABG x 5 Dr. Darcey Nora (LIMA to LAD, SVG to diagonal, SVG to OM1, SVG to OM2, SVG to PDA)  . Carotid arterial disease ([place])    a. 12/2013 Carotid U/S: 1-39% bilat ICA stenosis.  . Carpal tunnel syndrome, bilateral   . GERD (gastroesophageal reflux  disease)   . Hemorrhoids, internal, with bleeding & prolapse 12/13/2011  . Hernia, abdominal   . History of blood transfusion    CABG and hysterectomy  . History of kidney stones   . Hyperlipidemia   . Hypertension   . Iron deficiency 12-07-2011  . Keloid scar    a. Sternal Keloid s/p CABG with ongoing pain.  . Morbid obesity ([place])   . Myocardial infarct ([place])    2015  . Neuropathy   . Osteoarthritis   . Osteoporosis   . PONV (postoperative nausea and vomiting)   . Seasonal allergies   . Sleep apnea    no CPAP machine; sleep study 02/2010 REM AHI 61.7/hr, total sleep REM 14.8/hr  . Uterine cancer ([place]) 7/15   clinical stage IA grade 1 endometrioid endometrial cancer    Patient Active Problem List   Diagnosis Date Noted  . Pes planus of left foot 09/08/2020  . Hallux valgus 09/08/2020  . Tinea corporis 09/08/2020  . Glucose intolerance (impaired glucose tolerance) 07/14/2020  . Vitamin D deficiency 07/14/2020  . Depression 10/16/2018  . Osteoarthritis of left shoulder 10/24/2017  . Peripheral neuropathy 08/19/2017  . Atherosclerosis of aorta ([place]) 06/20/2015  . Osteoarthritis of right knee 11/07/2014  . Health care maintenance 10/09/2014  . Coronary atherosclerosis of native coronary artery 01/05/2014  . Morbid obesity due to excess  calories ([place]) 05/10/2012  . Obstructive sleep apnea 03/27/2010  . GERD 08/06/2008  . Hyperlipidemia 10/31/2007  . Essential hypertension 06/30/2007    Past Surgical History:  Procedure Laterality Date  . BREAST REDUCTION SURGERY Bilateral 10/10/2015   Procedure: BILATERAL MAMMARY REDUCTION  (BREAST) WITH FREE NIPPLE GRAFT;  Surgeon: Irene Limbo, MD;  Location: [facility];  Service: Plastics;  Laterality: Bilateral;  . CARDIAC CATHETERIZATION    . CATARACT EXTRACTION Right   . COLONOSCOPY  2009  . COLONOSCOPY W/ BIOPSIES AND POLYPECTOMY    . CORONARY ARTERY BYPASS GRAFT N/A 01/09/2014   Procedure: CORONARY ARTERY BYPASS GRAFTING (CABG) x  5 using left internal mammary artery and right leg greater saphenous vein harvested endoscopically;  Surgeon: Ivin Poot, MD;  Location: [facility];  Service: Open Heart Surgery;  Laterality: N/A;  please use bed extenders and breast binder  . INTRAOPERATIVE TRANSESOPHAGEAL ECHOCARDIOGRAM N/A 01/09/2014   Procedure: INTRAOPERATIVE TRANSESOPHAGEAL ECHOCARDIOGRAM;  Surgeon: Ivin Poot, MD;  Location: [facility];  Service: Open Heart Surgery;  Laterality: N/A;  . KNEE ARTHROSCOPY  1991  . KNEE SURGERY Bilateral 1999  . LEFT HEART CATHETERIZATION WITH CORONARY ANGIOGRAM N/A 01/04/2014   Procedure: LEFT HEART CATHETERIZATION WITH CORONARY ANGIOGRAM;  Surgeon: Sinclair Grooms, MD;  Location: [facility] CATH LAB;  Service: Cardiovascular;  Laterality: N/A;  . LESION EXCISION WITH COMPLEX REPAIR N/A 05/07/2016   Procedure: COMPLEX REPAIR OF CHEST 16 CM;  Surgeon: Irene Limbo, MD;  Location: [facility];  Service: Plastics;  Laterality: N/A;  . MULTIPLE EXTRACTIONS WITH ALVEOLOPLASTY N/A 04/11/2014   Procedure: Extraction of tooth #'s 1,2,8,16 with alveoloplasty, maxillary tuberosity reductions, and gross debridement of remaining teeth.;  Surgeon: Lenn Cal, DDS;  Location: [facility];  Service: Oral Surgery;  Laterality: N/A;  . NM MYOCAR PERF WALL MOTION  01/2010   dipyridamole myoview - moderate perfusion defect in basal inferoseptal, basal inferior, mid inferoseptal, mid inferior, apical inferior region; EF 56%  . TEE WITHOUT CARDIOVERSION  01/2010   EF 60-65%, small, flat, non-infiltrating, calcified, fixed apical/septal mass  . TOTAL KNEE ARTHROPLASTY Left 04/29/2011  . transanal hemorrhoidal dearterliaization  01/06/12   with external hemorrhoid removal  . UNILATERAL SALPINGECTOMY Right 06/03/2014   Procedure: UNILATERAL SALPINGECTOMY;  Surgeon: Lavonia Drafts, MD;  Location: [facility];  Service: Gynecology;  Laterality: Right;  Marland Kitchen VAGINAL HYSTERECTOMY N/A 06/03/2014   Procedure: HYSTERECTOMY VAGINAL;   Surgeon: Lavonia Drafts, MD;  Location: [facility];  Service: Gynecology;  Laterality: N/A;  . WISDOM TOOTH EXTRACTION       OB History    Gravida  2   Para  2   Term  2   Preterm  0   AB  0   Living  2     SAB  0   IAB  0   Ectopic  0   Multiple  0   Live Births              Family History  Problem Relation Age of Onset  . Heart attack Mother 26  . Heart disease Mother   . Hypertension Mother   . Diabetes Mother   . Hypertension Sister   . Heart failure Sister   . Heart disease Sister   . Diabetes Sister   . Kidney disease Sister   . Kidney disease Brother   . Bipolar disorder Son   . Schizophrenia Son   . Anxiety disorder Son   . Hypertension Daughter   .  Esophageal cancer Other        nephew  . Colon cancer Neg Hx   . Pancreatic cancer Neg Hx   . Stomach cancer Neg Hx   . Liver disease Neg Hx   . Rectal cancer Neg Hx     Social History   Tobacco Use  . Smoking status: Never Smoker  . Smokeless tobacco: Never Used  Vaping Use  . Vaping Use: Never used  Substance Use Topics  . Alcohol use: No    Alcohol/week: 0.0 standard drinks  . Drug use: No    Home Medications Prior to Admission medications   Medication Sig Start Date End Date Taking? Authorizing Provider  oxyCODONE-acetaminophen (PERCOCET/ROXICET) 5-325 MG tablet Take 1 tablet by mouth every 4 (four) hours as needed for up to 3 days for severe pain. 01/29/21 02/01/21 Yes Davie Claud, PA-C  albuterol (PROVENTIL HFA;VENTOLIN HFA) 108 (90 Base) MCG/ACT inhaler Inhale 2 puffs into the lungs every 6 (six) hours as needed for wheezing or shortness of breath. 12/22/18   Kathi Ludwig, MD  amLODipine (NORVASC) 5 MG tablet TAKE 1 TABLET BY MOUTH EVERY DAY 07/30/20   Hilty, Nadean Corwin, MD  aspirin EC 81 MG tablet Take 81 mg by mouth daily.    [provider]  Cholecalciferol (VITAMIN D HIGH POTENCY) 25 MCG (1000 UT) capsule Take 1,000 Units by mouth daily.     [provider]  ciclopirox (PENLAC) 8 % solution Apply topically at bedtime. Apply over nail and surrounding skin. Apply daily over previous coat. After seven (7) days, may remove with alcohol and continue cycle. 11/25/20   Trula Slade, DPM  clotrimazole (LOTRIMIN) 1 % cream Apply 1 application topically 2 (two) times daily. 09/08/20   [provider]  Clotrimazole 1 % OINT Apply twice daily to the affected area on the left leg 09/08/20   Axel Filler, MD  diclofenac Sodium (VOLTAREN) 1 % GEL Apply 2 g topically 4 (four) times daily. 06/16/20   Axel Filler, MD  esomeprazole (NEXIUM) 20 MG capsule Take 20 mg by mouth every other day. Taking daily    [provider]  fluticasone (FLONASE) 50 MCG/ACT nasal spray PLACE 1 SPRAY INTO BOTH NOSTRILS DAILY. Patient taking differently: Place 1 spray into both nostrils daily as needed for allergies. 07/18/19 08/06/21  Axel Filler, MD  losartan (COZAAR) 100 MG tablet Take 1 tablet (100 mg total) by mouth daily. 11/01/19   Axel Filler, MD  metoprolol tartrate (LOPRESSOR) 25 MG tablet TAKE 1 AND 1/2 TABLETS BY MOUTH TWICE A DAY 11/04/20   Hilty, Nadean Corwin, MD  Multiple Vitamin (MULTIVITAMIN WITH MINERALS) TABS tablet Take 1 tablet by mouth See admin instructions. Take one tablet by mouth on Monday, Wednesday and Fridays    [provider]  Polyethyl Glycol-Propyl Glycol (SYSTANE FREE OP) Apply 1 tablet to eye daily as needed (For dry eyes).    [provider]  rosuvastatin (CRESTOR) 20 MG tablet TAKE 1 TABLET BY MOUTH EVERY DAY 10/28/20   Axel Filler, MD    Allergies    Cephalosporins, Eicosapentaenoic acid (epa), Fish-derived products, Peanuts [peanut oil], Shrimp [shellfish allergy], Latex, Metronidazole, Ciprofloxacin, Lisinopril, Naproxen, Other, and Prednisone  Review of Systems   Review of Systems  Constitutional: Negative for fever.  Respiratory: Negative for  shortness of breath.   Cardiovascular: Negative for chest pain.  Gastrointestinal: Negative for abdominal pain.  Musculoskeletal: Positive for arthralgias.  Skin: Positive for rash.  Neurological: Negative for headaches.    Physical Exam Updated Vital Signs BP (!) 150/84 (BP Location: Left Arm)   Pulse 84   Temp 99.2 F (37.3 C) (Oral)   Resp 18   LMP 01/13/2014 Comment: spotting in Feb and bleeding since April  SpO2 97%   Physical Exam Vitals and nursing note reviewed.  Constitutional:      Appearance: Normal appearance.  HENT:     Head: Normocephalic and atraumatic.     Mouth/Throat:     Mouth: Mucous membranes are moist.  Eyes:     Pupils: Pupils are equal, round, and reactive to light.  Cardiovascular:     Rate and Rhythm: Normal rate.  Pulmonary:     Effort: Pulmonary effort is normal.     Breath sounds: No wheezing.  Abdominal:     General: Abdomen is flat.     Palpations: Abdomen is soft.     Tenderness: There is no abdominal tenderness. There is no right CVA tenderness or left CVA tenderness.  Musculoskeletal:        General: Tenderness present.     Cervical back: Normal range of motion and neck supple.     Right hip: Tenderness present. No deformity, lacerations or bony tenderness. Decreased range of motion. Normal strength.     Right lower leg: No edema.     Left lower leg: No edema.  Skin:    General: Skin is warm.     Findings: Rash present.          Comments: pinpoint erythematous vesicles to her right ankle.   Neurological:     Mental Status: She is alert.     ED Results / Procedures / Treatments   Labs (all labs ordered are listed, but only abnormal results are displayed) Labs Reviewed - No data to display  EKG None  Radiology DG HIP UNILAT WITH PELVIS 2-3 VIEWS RIGHT  Result Date: 01/29/2021 CLINICAL DATA:  Right hip pain, no reported injury EXAM: DG HIP (WITH OR WITHOUT PELVIS) 2-3V RIGHT COMPARISON:  06/27/2014 CT abdomen/pelvis  FINDINGS: No pelvic fracture or diastasis. No right hip fracture or dislocation. No suspicious focal osseous lesions. No significant right hip arthropathy. Moderate left hip osteoarthritis is unchanged. Surgical clip noted in the medial proximal right thigh soft tissues. IMPRESSION: No acute osseous abnormality. No significant right hip arthropathy. Stable moderate left hip osteoarthritis. Electronically Signed   By: Ilona Sorrel M.D.   On: 01/29/2021 11:49    Procedures Procedures   Medications Ordered in ED Medications  oxyCODONE-acetaminophen (PERCOCET/ROXICET) 5-325 MG per tablet 1 tablet (1 tablet Oral Given 01/29/21 1135)  diphenhydrAMINE (BENADRYL) capsule 25 mg (25 mg Oral Given 01/29/21 1135)    ED Course  I have reviewed the triage vital signs and the nursing notes.  Pertinent labs & imaging results that were available during my care of the patient were reviewed by me and considered in my medical decision making (see chart for details).    MDM Rules/Calculators/A&P                           Patient with no pertinent past medical history presents to the ED with a chief complaint of right hip pain, this began Sunday after she was sitting onto her sewing chair.  Vitals are within normal limits.  Does not have any prior history of fracture to her right hip, no osteopenia on record.  Denies any red flags such  as bowel or bladder incontinence, fever, prior history of IV drug use.  Does report pain is exacerbated with movement, she does walk with a cane but reports this is now worsened as she is unable to take a couple steps.  Discussed pain medication of Percocet along with Benadryl she reports itching with any narcotic.  X-ray of the right hip showed: No acute osseous abnormality. No significant right hip arthropathy.  Stable moderate left hip osteoarthritis.    These results were discussed at length with patient, she is aware she will need to follow-up with orthopedics as needed.   We will send her home on a short course of strong pain medication.  She does report she gets itchy with any medication that she takes, suggested Zofran.  Return precautions discussed at length.  Patient stable for discharge.   Portions of this note were generated with [employer]. Dictation errors may occur despite best attempts at proofreading.  Final Clinical Impression(s) / ED Diagnoses Final diagnoses:  Right hip pain    Rx / DC Orders ED Discharge Orders         Ordered    oxyCODONE-acetaminophen (PERCOCET/ROXICET) 5-325 MG tablet  Every 4 hours PRN        01/29/21 1301           Janeece Fitting, PA-C 01/29/21 1301    Breck Coons, MD 01/30/21 0700

## 2021-02-03 ENCOUNTER — Encounter: Payer: Self-pay | Admitting: *Deleted

## 2021-02-03 NOTE — Progress Notes (Signed)

## 2021-02-09 NOTE — Progress Notes (Signed)
Things That May Be Affecting Your Health:  Alcohol  Hearing loss  Pain    Depression x Home Safety  Sexual Health   Diabetes  Lack of physical activity  Stress   Difficulty with daily activities  Loneliness  Tiredness   Drug use  Medicines  Tobacco use  x Falls x Motor Vehicle Safety  Weight   Food choices  Oral Health  Other    YOUR PERSONALIZED HEALTH PLAN : 1. Schedule your next subsequent Medicare Wellness visit in one year 2. Attend all of your regular appointments to address your medical issues 3. Complete the preventative screenings and services   Annual Wellness Visit   Medicare Covered Preventative Screenings and [place] Men and Women Who How Often Need? Date of Last Service Action  Abdominal Aortic Aneurysm Adults with AAA risk factors Once      Alcohol Misuse and Counseling All Adults Screening once a year if no alcohol misuse. Counseling up to 4 face to face sessions.     Bone Density Measurement  Adults at risk for osteoporosis Once every 2 yrs      Lipid Panel Z13.6 All adults without CV disease Once every 5 yrs       Colorectal Cancer   Stool sample or  Colonoscopy All adults 3 and older   Once every year  Every 10 years        Depression All Adults Once a year  Today   Diabetes Screening Blood glucose, post glucose load, or GTT Z13.1  All adults at risk  Pre-diabetics  Once per year  Twice per year      Diabetes  Self-Management Training All adults Diabetics 10 hrs first year; 2 hours subsequent years. Requires Copay     Glaucoma  Diabetics  Family history of glaucoma  African Americans 22 yrs +  Hispanic Americans 10 yrs + Annually - requires coppay      Hepatitis C Z72.89 or F19.20  High Risk for HCV  Born between 1945 and 1965  Annually  Once      HIV Z11.4 All adults based on risk  Annually btw ages 50 & 7 regardless of risk  Annually > 65 yrs if at increased risk      Lung Cancer Screening  Asymptomatic adults aged 55-77 with 30 pack yr history and current smoker OR quit within the last 15 yrs Annually Must have counseling and shared decision making documentation before first screen      Medical Nutrition Therapy Adults with   Diabetes  Renal disease  Kidney transplant within past 3 yrs 3 hours first year; 2 hours subsequent years     Obesity and Counseling All adults Screening once a year Counseling if BMI 30 or higher  Today   Tobacco Use Counseling Adults who use tobacco  Up to 8 visits in one year     Vaccines Z23  Hepatitis B  Influenza   Pneumonia  Adults   Once  Once every flu season  Two different vaccines separated by one year     Next Annual Wellness Visit People with Medicare Every year  Today     Services & Screenings Women Who How Often Need  Date of Last Service Action  Mammogram  Z12.31 Women over 17 One baseline ages 38-39. Annually ager 40 yrs+      Pap tests All women Annually if high risk. Every 2 yrs for normal risk women  Screening for cervical cancer with   Pap (Z01.419 nl or Z01.411abnl) &  HPV Z11.51 Women aged 43 to 37 Once every 5 yrs     Screening pelvic and breast exams All women Annually if high risk. Every 2 yrs for normal risk women     Sexually Transmitted Diseases  Chlamydia  Gonorrhea  Syphilis All at risk adults Annually for non pregnant females at increased risk         [place] Men Who How Ofter Need  Date of Last Service Action  Prostate Cancer - DRE & PSA Men over 50 Annually.  DRE might require a copay.        Sexually Transmitted Diseases  Syphilis All at risk adults Annually for men at increased risk      Health Maintenance List Health Maintenance  Topic Date Due  . TETANUS/TDAP  10/16/2019  . INFLUENZA VACCINE  06/22/2020  . COVID-19 Vaccine (3 - Booster for Pfizer series) 11/04/2020  . MAMMOGRAM  07/22/2022  . PNA vac Low Risk Adult (2 of 2 - PPSV23) 10/24/2022  .  COLONOSCOPY (Pts 45-33yrs Insurance coverage will need to be confirmed)  09/16/2027  . DEXA SCAN  Completed  . Hepatitis C Screening  Completed  . HPV VACCINES  Aged Out   Please discuss Flu and Covid vaccination status

## 2021-02-10 DIAGNOSIS — M180 Bilateral primary osteoarthritis of first carpometacarpal joints: Secondary | ICD-10-CM | POA: Diagnosis not present

## 2021-02-10 DIAGNOSIS — G5603 Carpal tunnel syndrome, bilateral upper limbs: Secondary | ICD-10-CM | POA: Diagnosis not present

## 2021-02-16 ENCOUNTER — Other Ambulatory Visit: Payer: Self-pay

## 2021-02-16 ENCOUNTER — Encounter: Payer: Self-pay | Admitting: Student in an Organized Health Care Education/Training Program

## 2021-02-16 ENCOUNTER — Ambulatory Visit: Payer: Medicare Other | Admitting: Student in an Organized Health Care Education/Training Program

## 2021-02-16 VITALS — BP 120/44 | HR 53 | Temp 98.2°F | Ht 67.5 in | Wt 291.6 lb

## 2021-02-23 ENCOUNTER — Encounter: Payer: Self-pay | Admitting: Student in an Organized Health Care Education/Training Program

## 2021-02-23 ENCOUNTER — Ambulatory Visit (INDEPENDENT_AMBULATORY_CARE_PROVIDER_SITE_OTHER): Payer: Medicare Other | Admitting: Student in an Organized Health Care Education/Training Program

## 2021-02-23 VITALS — BP 152/62 | HR 100 | Temp 98.2°F | Wt 294.4 lb

## 2021-02-23 DIAGNOSIS — Z23 Encounter for immunization: Secondary | ICD-10-CM

## 2021-02-23 DIAGNOSIS — Z0000 Encounter for general adult medical examination without abnormal findings: Secondary | ICD-10-CM | POA: Diagnosis not present

## 2021-02-23 DIAGNOSIS — M545 Low back pain, unspecified: Secondary | ICD-10-CM | POA: Insufficient documentation

## 2021-02-23 DIAGNOSIS — I10 Essential (primary) hypertension: Secondary | ICD-10-CM

## 2021-02-23 NOTE — Progress Notes (Signed)
Cancelled visit

## 2021-02-23 NOTE — Assessment & Plan Note (Signed)
Blood pressure mildly elevated today.  Patient thinks she did not take her blood pressure medication.  It was normal just 1 week ago.  We will plan to continue with current regimen of losartan 100 mg daily, amlodipine 5 mg daily.  Follow-up in 3 months.  BMP in February looked okay.

## 2021-02-23 NOTE — Assessment & Plan Note (Signed)
Up-to-date on healthcare maintenance.  We talked about advantages of COVID-19 booster vaccination which she will look into.  Provided her with a Tdap today.

## 2021-02-23 NOTE — Assessment & Plan Note (Signed)
Symptoms of a high risk low back pain with a few red flag features, including abrupt onset of urinary incontinence which lasted for about 1 week as well as diminished sensation in perineal nerve distribution.  No personal history of cancer, had a minor abnormality on her mammogram last year but felt to be low risk on ultrasound follow-up.  No symptoms to suggest spinal infection.  She seems to be recovering with supportive care over the last few weeks, but still has some persistent symptoms.  I suspect she has high-grade spinal stenosis either from osteoarthritis or degenerative disc disease.  Given the high risk features of the urinary incontinence will obtain a MRI of the lumbar spine to rule out lesions that need surgical intervention.  We will continue with supportive care for now, no changes to her medications.

## 2021-02-23 NOTE — Progress Notes (Signed)
   Assessment and Plan:  See Encounters tab for problem-based medical decision making.   __________________________________________________________  HPI:   65 year old person here for follow-up from an ED visits on March 10 for severe hip pain.  The way she describes the pain, it came on abruptly over the right lateral hip, then started radiating across the low back to the left side of her body.  It was associated with sudden weakness of the right leg, she was having trouble walking even with the assistance of her cane and walker.  She also described a sudden onset of urinary incontinence, said whenever she stood up out of bed she would lose all of her urine, and did not have sensation of urinating at the time.  Denies any bowel incontinence.  Denies any fevers, no trauma, no night sweats or unintended weight loss.  She went to the emergency department on March 10, really focused on the hip as her pain and had an x-ray of bilateral hips which did not show much osteoarthritis.  She was discharged home with supportive care, reported some benefit after about a week.  She is now able to ambulate but still feels unsteady.  Having difficulty completing her daily tasks, struggles to walk around her house.  Still has significant amount of pain across her low back that radiates into the bilateral groins.  Though she describes it as hip, looking at where she is pointing the pain it seems most consistent with low back.  Denies any other recent illnesses, reports good adherence with her medications, denies any recent medication changes.  __________________________________________________________  Problem List: Patient Active Problem List   Diagnosis Date Noted  . Atherosclerosis of aorta ([place]) 06/20/2015    Priority: High  . Coronary atherosclerosis of native coronary artery 01/05/2014    Priority: High  . Osteoarthritis of right knee 11/07/2014    Priority: Medium  . Morbid obesity due to excess calories  ([place]) 05/10/2012    Priority: Medium  . Obstructive sleep apnea 03/27/2010    Priority: Medium  . Essential hypertension 06/30/2007    Priority: Medium  . Pes planus of left foot 09/08/2020    Priority: Low  . Hallux valgus 09/08/2020    Priority: Low  . Glucose intolerance (impaired glucose tolerance) 07/14/2020    Priority: Low  . Vitamin D deficiency 07/14/2020    Priority: Low  . Depression 10/16/2018    Priority: Low  . Osteoarthritis of left shoulder 10/24/2017    Priority: Low  . Peripheral neuropathy 08/19/2017    Priority: Low  . Health care maintenance 10/09/2014    Priority: Low  . GERD 08/06/2008    Priority: Low  . Hyperlipidemia 10/31/2007    Priority: Low  . Low back pain 02/23/2021  . Tinea corporis 09/08/2020    Medications: Reconciled today in Epic __________________________________________________________  Physical Exam:  Vital Signs: Vitals:   02/23/21 0936  BP: (!) 152/62  Pulse: 100  Temp: 98.2 F (36.8 C)  TempSrc: Oral  SpO2: 100%  Weight: 294 lb 6.4 oz (133.5 kg)    Gen: Well appearing, NAD Neck: No cervical LAD, No thyromegaly or nodules, No JVD, visible pulsations of the right carotid artery CV: RRR, no murmurs Pulm: Normal effort, CTA throughout, no wheezing Abd: Soft, NT, ND Ext: Warm, no edema, normal joints Neuro: Alert, conversational, normal strength in bilateral lower extremities, reflexes unable to be elicited, delayed get up and go from the chair, wide-based unsteady gait, stooped over posture.

## 2021-02-23 NOTE — Addendum Note (Signed)
Addended by: Despina Hidden C on: 02/23/2021 11:00 AM   Modules accepted: Orders

## 2021-02-28 ENCOUNTER — Other Ambulatory Visit: Payer: Self-pay | Admitting: Internal Medicine

## 2021-03-04 ENCOUNTER — Other Ambulatory Visit: Payer: Self-pay | Admitting: Student in an Organized Health Care Education/Training Program

## 2021-03-09 ENCOUNTER — Ambulatory Visit (INDEPENDENT_AMBULATORY_CARE_PROVIDER_SITE_OTHER): Payer: Medicare Other | Admitting: Podiatry

## 2021-03-09 ENCOUNTER — Other Ambulatory Visit: Payer: Self-pay

## 2021-03-09 DIAGNOSIS — M79675 Pain in left toe(s): Secondary | ICD-10-CM | POA: Diagnosis not present

## 2021-03-09 DIAGNOSIS — G629 Polyneuropathy, unspecified: Secondary | ICD-10-CM

## 2021-03-09 DIAGNOSIS — M79674 Pain in right toe(s): Secondary | ICD-10-CM

## 2021-03-09 DIAGNOSIS — B351 Tinea unguium: Secondary | ICD-10-CM | POA: Diagnosis not present

## 2021-03-10 NOTE — Progress Notes (Signed)
Subjective: 65 y.o. returns the office today for painful, elongated, thickened toenails which she cannot trim herself. Denies any redness or drainage around the nails.  She is been using the Penlac which has been helpful.  She is also follow-up with neurology.  Denies any acute changes since last appointment and no new complaints today. Denies any systemic complaints such as fevers, chills, nausea, vomiting.   PCP: Axel Filler, MD Last Seen:  A1c:  Objective: AAO 3, NAD DP/PT pulses palpable, CRT less than 3 seconds Nails hypertrophic, dystrophic, elongated, brittle, discolored 10. There is tenderness overlying the nails 1-5 bilaterally. There is no surrounding erythema or drainage along the nail sites.  There are some clearing on the proximal nail folds. No open lesions or pre-ulcerative lesions are identified. No other areas of tenderness bilateral lower extremities. No overlying edema, erythema, increased warmth. No pain with calf compression, swelling, warmth, erythema.  Assessment: Patient presents with symptomatic onychomycosis, neuropathy  Plan: -Treatment options including alternatives, risks, complications were discussed -Continue Penlac  -Nails sharply debrided 10 without complication/bleeding. -Continue follow-up with neurology for neuropathy. -Discussed daily foot inspection. If there are any changes, to call the office immediately.  -Follow-up in 3 months or sooner if any problems are to arise. In the meantime, encouraged to call the office with any questions, concerns, changes symptoms.  Celesta Gentile, DPM

## 2021-03-12 ENCOUNTER — Ambulatory Visit (HOSPITAL_COMMUNITY): Admission: RE | Admit: 2021-03-12 | Payer: Medicare Other | Source: Ambulatory Visit

## 2021-04-02 ENCOUNTER — Encounter: Payer: Self-pay | Admitting: Internal Medicine

## 2021-04-02 ENCOUNTER — Other Ambulatory Visit: Payer: Self-pay

## 2021-04-02 ENCOUNTER — Ambulatory Visit (INDEPENDENT_AMBULATORY_CARE_PROVIDER_SITE_OTHER): Payer: Medicare Other | Admitting: Internal Medicine

## 2021-04-02 VITALS — BP 135/83 | HR 56 | Ht 68.0 in | Wt 287.4 lb

## 2021-04-02 DIAGNOSIS — E785 Hyperlipidemia, unspecified: Secondary | ICD-10-CM | POA: Diagnosis not present

## 2021-04-02 DIAGNOSIS — I10 Essential (primary) hypertension: Secondary | ICD-10-CM | POA: Diagnosis not present

## 2021-04-02 DIAGNOSIS — Z951 Presence of aortocoronary bypass graft: Secondary | ICD-10-CM

## 2021-04-02 LAB — LIPID PANEL
Chol/HDL Ratio: 2.5 ratio (ref 0.0–4.4)
Cholesterol, Total: 131 mg/dL (ref 100–199)
HDL: 53 mg/dL (ref >39)
LDL Chol Calc (NIH): 62 mg/dL (ref 0–99)
Triglycerides: 82 mg/dL (ref 0–149)
VLDL Cholesterol Cal: 16 mg/dL (ref 5–40)

## 2021-04-02 NOTE — Patient Instructions (Signed)
Medication Instructions:  Your physician recommends that you continue on your current medications as directed. Please refer to the Current Medication list given to you today.  *If you need a refill on your cardiac medications before your next appointment, please call your pharmacy*   Lab Work: FASTING lab work to check cholesterol   If you have labs (blood work) drawn today and your tests are completely normal, you will receive your results only by: Marland Kitchen MyChart Message (if you have MyChart) OR . A paper copy in the mail If you have any lab test that is abnormal or we need to change your treatment, we will call you to review the results.   Testing/Procedures: NONE   Follow-Up: At [facility], you and your health needs are our priority.  As part of our continuing mission to provide you with exceptional heart care, we have created designated Provider Care Teams.  These Care Teams include your primary Cardiologist (physician) and Advanced Practice Providers (APPs -  Physician Assistants and Nurse Practitioners) who all work together to provide you with the care you need, when you need it.  We recommend signing up for the patient portal called "MyChart".  Sign up information is provided on this After Visit Summary.  MyChart is used to connect with patients for Virtual Visits (Telemedicine).  Patients are able to view lab/test results, encounter notes, upcoming appointments, etc.  Non-urgent messages can be sent to your provider as well.   To learn more about what you can do with MyChart, go to [email or web address].    Your next appointment:   12 month(s)  The format for your next appointment:   In Person  Provider:   Dr. Lyman Bishop   Other Instructions

## 2021-04-02 NOTE — Progress Notes (Signed)
OFFICE NOTE  Chief Complaint:  Routine follow-up  Primary Care Physician: Axel Filler, MD  HPI:  [name] is a 64 year old African American obese female presented to 01/01/14 with symptoms of unstable angina. I had seen her once in 2012, but not since then.  She recently had been having chest pain and presented urgently when she became short of breath at home and had chest tightness, which started as right arm pain and right chest pain, then moved substernally. She ultimately underwent cardiac catheterization via right radial artery which demonstrated severe three-vessel CAD with graftable target vessels. Echocardiogram showed good LV function with some left ventricular hypertrophy from her hypertension and there does not appear to be significant valve disease. Carotid dopplers showed a 1-39% bilateral carotid artery stenosis. She underwent a CABG x 5 on 01/09/2014. Left internal mammary artery to LAD, saphenous vein graft to diagonal, sequential saphenous vein graft to OM1 and OM2, saphenous vein graft to posterior descending. She had multiple teeth extractions prior to surgery. She did well post op. She was anemic but did not need transfusion. At discharge she went to SNF for rehab-she did not stay the recommended time frame due to BR not being available when she needed it. She was not given discharge medications when she left SNF. She saw Cecilie Kicks, FNP, in followup for her hospitalization and was restarted on some of her medications.   [name] was seen today in the office for follow-up. At her last office visit I cleared her for surgery and she underwent hysterectomy and has had resolution of her bleeding problems. She is now contemplating right knee surgery and also breast reduction surgery. She's in the interim seen in the hospital for palpitations. She reports those have improved with medication adjustment. She's decreased her Lasix now to every other day and I think she  can probably take it as needed. She denies any chest pain worsening shortness of breath. She is undergoing injections to reduce the size of her keloid scar.  I saw [name] back in the office today. She's been undergoing rehabilitation and really wants to continue it. She's managed to lose some weight and feels better. Recently though she's been having increased blood pressures. Does have ranged up to [id number] systolic. She had a small increase in her lisinopril to 10 mg daily with an improvement however this staff does not want to continue her at rehabilitation until her blood pressure is better controlled. She denies any chest pain or palpitations.  [name] returns today for follow-up. She continues in cardiac rehabilitation and is doing well. At his last office visit I increased her lisinopril to 20 mg daily and her blood pressure is now much better controlled. Overall she is feeling well. She is undergoing cesarean injections of the keloid scar that she has in her bypass site. She is concerned about a dilated varicose vein that she's developed at the inferior margin.  Since I last saw her, she is doing well. She underwent breast reductions surgery, which has helped her CABG scar some, but has failed to lose significant weight. BMI is now over 50. She does get short of breath but not worsening. Blood pressure is well-controlled and cough has gone away after switching from ACE-I to ARB. She reports slightly more palpitations recently. She has episodes of palpitations, worse at night that occur 3-4x a month.  07/01/2016  [name] returns today for follow-up. She reports some left leg swelling. She has  a history of left knee surgery and has had swelling since that time. She occasionally gets some swelling in the right leg which is where her saphenous vein grafts were taken. She still reports numbness and tingling around the vein harvest sites. She has had numbness and tingling of the chest which is  exquisitely tender to palpation although she has a "high pain tolerance". She recently underwent revision of the keloid scar to her median sternotomy. That does appear somewhat improved. She denies any worsening shortness of breath. Weight is stable.  03/21/2017  [name] was seen today in follow-up. She recently saw Ignacia Bayley, NP, for evaluation of chest pain. Given her history of coronary artery disease and prior CABG, he was concerned about recurrent ischemia. He ordered a [employer] which she underwent and it turns out was negative for ischemia. I do think a lot of her pain is related to her anterior chest keloid scar. She's also having problems with her knees which aren't about the importance of weight loss today. Recently she's lost a little bit away however she is still at a BMI around 50. Recently her blood pressure is running somewhat higher and her PCP increased her irbesartan from 150 mg to 300 mg daily.  04/13/2018  [name] was seen today in routine follow-up.  Over the past year she is done well.  She denies any further chest pain.  She had had an episode not long after bypass and underwent repeat stress testing which was negative for ischemia.  She continues to have problems with a keloid scar over her median sternotomy.  She plans to see a dermatologist about this.  Blood pressure is been fairly well controlled.  Unfortunately her weight is not decreased significantly.  She has been having problems with arthritis and uses BC powder.  I recommended discontinuing that.  She is not taking daily aspirin which she should be and will start that.  She also uses Aleve and I recommended using that sparingly.  She should consider Tylenol as an alternative.  She had an EKG today showed sinus bradycardia at 54 with very flat T waves and a questionable long QTC of over 650 ms.  On my evaluation, is very difficult to see where the T wave and.  I am not certain that there is actually QT  prolongation, and she is not actually on medications that would cause that.   04/07/2020  [name] returns today for follow-up.  Overall she seems to be doing well.  She has never arthritic issues and walks with a cane.  She is recently had some weight loss and intends to get down to 225 later this year.  She denies any chest pain or worsening shortness of breath.  Her blood pressure is elevated today however she saw her PCP this morning and it was much better controlled.  She says she switch to a plant-based diet.  04/02/2021  [name] returns today for follow-up.  She continues to do well.  She is lost some additional weight.  She is sticking to a plant-based diet.  Blood pressure appears to be well controlled.  She still using a cane and having issues with her back and hips.  She wants to be more active.  I encouraged her to consider utilizing her membership at the [facility].  She is due for repeat lipid as her last cholesterol test was in 2021.  She denies any chest pain with exertion.  PMHx:  Past  Medical History:  Diagnosis Date  . Acute urinary retention s/p Foley 01/07/2012  . Allergy   . Anemia   . Asthma   . CAD (coronary artery disease)    a. 12/2013 s/p CABG x 5 Dr. Darcey Nora (LIMA to LAD, SVG to diagonal, SVG to OM1, SVG to OM2, SVG to PDA)  . Carotid arterial disease ([place])    a. 12/2013 Carotid U/S: 1-39% bilat ICA stenosis.  . Carpal tunnel syndrome, bilateral   . GERD (gastroesophageal reflux disease)   . Hemorrhoids, internal, with bleeding & prolapse 12/13/2011  . Hernia, abdominal   . History of blood transfusion    CABG and hysterectomy  . History of kidney stones   . Hyperlipidemia   . Hypertension   . Iron deficiency 12-07-2011  . Keloid scar    a. Sternal Keloid s/p CABG with ongoing pain.  . Morbid obesity ([place])   . Myocardial infarct ([place])    2015  . Neuropathy   . Osteoarthritis   . Osteoporosis   . PONV (postoperative nausea and vomiting)    . Seasonal allergies   . Sleep apnea    no CPAP machine; sleep study 02/2010 REM AHI 61.7/hr, total sleep REM 14.8/hr  . Uterine cancer ([place]) 7/15   clinical stage IA grade 1 endometrioid endometrial cancer    Past Surgical History:  Procedure Laterality Date  . BREAST REDUCTION SURGERY Bilateral 10/10/2015   Procedure: BILATERAL MAMMARY REDUCTION  (BREAST) WITH FREE NIPPLE GRAFT;  Surgeon: Irene Limbo, MD;  Location: [place];  Service: Plastics;  Laterality: Bilateral;  . CARDIAC CATHETERIZATION    . CATARACT EXTRACTION Right   . COLONOSCOPY  2009  . COLONOSCOPY W/ BIOPSIES AND POLYPECTOMY    . CORONARY ARTERY BYPASS GRAFT N/A 01/09/2014   Procedure: CORONARY ARTERY BYPASS GRAFTING (CABG) x 5 using left internal mammary artery and right leg greater saphenous vein harvested endoscopically;  Surgeon: Ivin Poot, MD;  Location: [place];  Service: Open Heart Surgery;  Laterality: N/A;  please use bed extenders and breast binder  . INTRAOPERATIVE TRANSESOPHAGEAL ECHOCARDIOGRAM N/A 01/09/2014   Procedure: INTRAOPERATIVE TRANSESOPHAGEAL ECHOCARDIOGRAM;  Surgeon: Ivin Poot, MD;  Location: [place];  Service: Open Heart Surgery;  Laterality: N/A;  . KNEE ARTHROSCOPY  1991  . KNEE SURGERY Bilateral 1999  . LEFT HEART CATHETERIZATION WITH CORONARY ANGIOGRAM N/A 01/04/2014   Procedure: LEFT HEART CATHETERIZATION WITH CORONARY ANGIOGRAM;  Surgeon: Sinclair Grooms, MD;  Location: [facility] C[facility];  Service: Cardiovascular;  Laterality: N/A;  . LESION EXCISION WITH COMPLEX REPAIR N/A 05/07/2016   Procedure: COMPLEX REPAIR OF CHEST 16 CM;  Surgeon: Irene Limbo, MD;  Location: [place] [facility];  Service: Plastics;  Laterality: N/A;  . MULTIPLE EXTRACTIONS WITH ALVEOLOPLASTY N/A 04/11/2014   Procedure: Extraction of tooth #'s 1,2,8,16 with alveoloplasty, maxillary tuberosity reductions, and gross debridement of remaining teeth.;  Surgeon: Lenn Cal, DDS;  Location: [place];  Service: Oral Surgery;   Laterality: N/A;  . NM MYOCAR PERF WALL MOTION  01/2010   dipyridamole myoview - moderate perfusion defect in basal inferoseptal, basal inferior, mid inferoseptal, mid inferior, apical inferior region; EF 56%  . TEE WITHOUT CARDIOVERSION  01/2010   EF 60-65%, small, flat, non-infiltrating, calcified, fixed apical/septal mass  . TOTAL KNEE ARTHROPLASTY Left 04/29/2011  . transanal hemorrhoidal dearterliaization  01/06/12   with external hemorrhoid removal  . UNILATERAL SALPINGECTOMY Right 06/03/2014   Procedure: UNILATERAL SALPINGECTOMY;  Surgeon: Lavonia Drafts, MD;  Location: [facility];  Service: Gynecology;  Laterality: Right;  Marland Kitchen VAGINAL HYSTERECTOMY N/A 06/03/2014   Procedure: HYSTERECTOMY VAGINAL;  Surgeon: Lavonia Drafts, MD;  Location: [facility];  Service: Gynecology;  Laterality: N/A;  . WISDOM TOOTH EXTRACTION      FAMHx:  Family History  Problem Relation Age of Onset  . Heart attack Mother 34  . Heart disease Mother   . Hypertension Mother   . Diabetes Mother   . Hypertension Sister   . Heart failure Sister   . Heart disease Sister   . Diabetes Sister   . Kidney disease Sister   . Kidney disease Brother   . Bipolar disorder Son   . Schizophrenia Son   . Anxiety disorder Son   . Hypertension Daughter   . Esophageal cancer Other        nephew  . Colon cancer Neg Hx   . Pancreatic cancer Neg Hx   . Stomach cancer Neg Hx   . Liver disease Neg Hx   . Rectal cancer Neg Hx     SOCHx:   reports that she has never smoked. She has never used smokeless tobacco. She reports that she does not drink alcohol and does not use drugs.  ALLERGIES:  Allergies  Allergen Reactions  . Cephalosporins Anaphylaxis, Swelling and Other (See Comments)    Tongue swelling, gum pain  . Eicosapentaenoic Acid (Epa) Anaphylaxis and Shortness Of Breath  . Fish-Derived Products Anaphylaxis and Shortness Of Breath  . Peanuts [Peanut Oil] Anaphylaxis, Shortness Of Breath and Other (See  Comments)    Peanut butter  . Shrimp [Shellfish Allergy] Anaphylaxis    Pt states her throat will swell if she eats shrimp.  . Latex Itching  . Metronidazole Other (See Comments)    Palpitations, mild SOB, metallic taste, dry mouth, high blood pressure  . Ciprofloxacin Other (See Comments)    Gaging and achy  . Lisinopril Cough       . Naproxen Nausea Only and Other (See Comments)    Headache  . Other Itching and Other (See Comments)     All pain meds make her itch - has to have something to prevent that in addition to receiving med  . Prednisone Other (See Comments)    Heart beating fast Ok to take low dosage      ROS: Pertinent items noted in HPI and remainder of comprehensive ROS otherwise negative.  HOME MEDS: Current Outpatient Medications  Medication Sig Dispense Refill  . albuterol (PROVENTIL HFA;VENTOLIN HFA) 108 (90 Base) MCG/ACT inhaler Inhale 2 puffs into the lungs every 6 (six) hours as needed for wheezing or shortness of breath. 1 Inhaler 0  . amLODipine (NORVASC) 5 MG tablet TAKE 1 TABLET BY MOUTH EVERY DAY 90 tablet 3  . aspirin EC 81 MG tablet Take 81 mg by mouth daily.    . Cholecalciferol (VITAMIN D HIGH POTENCY) 25 MCG (1000 UT) capsule Take 1,000 Units by mouth daily.     . ciclopirox (PENLAC) 8 % solution Apply topically at bedtime. Apply over nail and surrounding skin. Apply daily over previous coat. After seven (7) days, may remove with alcohol and continue cycle. 6.6 mL 2  . clotrimazole (LOTRIMIN) 1 % cream Apply 1 application topically 2 (two) times daily.    . Clotrimazole 1 % OINT Apply twice daily to the affected area on the left leg 56.7 g 0  . diclofenac Sodium (VOLTAREN) 1 % GEL Apply 2 g topically 4 (four) times daily. 150 g 2  .  esomeprazole (NEXIUM) 20 MG capsule Take 20 mg by mouth every other day. Taking daily    . fluticasone (FLONASE) 50 MCG/ACT nasal spray PLACE 1 SPRAY INTO BOTH NOSTRILS DAILY. (Patient taking differently: Place 1 spray  into both nostrils daily as needed for allergies.) 16 mL 2  . losartan (COZAAR) 100 MG tablet TAKE 1 TABLET BY MOUTH EVERY DAY 90 tablet 3  . metoprolol tartrate (LOPRESSOR) 25 MG tablet TAKE 1 AND 1/2 TABLETS BY MOUTH TWICE A DAY 270 tablet 1  . Multiple Vitamin (MULTIVITAMIN WITH MINERALS) TABS tablet Take 1 tablet by mouth See admin instructions. Take one tablet by mouth on Monday, Wednesday and Fridays    . Polyethyl Glycol-Propyl Glycol (SYSTANE FREE OP) Apply 1 tablet to eye daily as needed (For dry eyes).    . rosuvastatin (CRESTOR) 20 MG tablet TAKE 1 TABLET BY MOUTH EVERY DAY 90 tablet 3   No current facility-administered medications for this visit.    LABS/IMAGING: No results found for this or any previous visit (from the past 48 hour(s)). No results found.  VITALS: BP 135/83   Pulse (!) 56   Ht 5\' 8"  (1.727 m)   Wt 287 lb 6.4 oz (130.4 kg)   LMP 01/13/2014 Comment: spotting in Feb and bleeding since April  SpO2 95%   BMI 43.70 kg/m   EXAM: General appearance: alert and no distress Neck: no carotid bruit and no JVD Lungs: clear to auscultation bilaterally Heart: regular rate and rhythm, S1, S2 normal, no murmur, click, rub or gallop Abdomen: soft, non-tender; bowel sounds normal; no masses,  no organomegaly and morbidly obese Extremities: extremities normal, atraumatic, no cyanosis or edema Pulses: 2+ and symmetric Skin: Keloid scar of the midsternal incision site with a dilated varicose vein at the distal aspect Neurologic: Grossly normal  EKG: Sinus bradycardia 56, ST and T wave changes laterally-personally reviewed-personally reviewed  ASSESSMENT: 1. Coronary artery disease status post 5 vessel CABG (2015) -low risk Myoview 02/2017 2. Morbid obesity 3. Hypertension 4. Dyslipidemia 5. OSA, not on cPAP 6. Keloid scar - s/p breast reduction, possible upcoming surgery for scar revision 7. Palpitations 8. Leg edema 9. ?  Prolonged QTC-review suggests this is  likely not prolonged due to flattening of the T wave  PLAN: 1.   Ms. Griffin continues to do well and is slowly losing weight.  She is switched to a plant-based diet.  Blood pressures well controlled today.  Her cholesterol is due for reassessment.  She continues have some issues with her keloid scar.  She denies any palpitations.  Follow-up with me annually or sooner as necessary.  [name], MD, [facility] [place] Director of the [facility] Lipid Disorders &  Cardiovascular [facility] Diplomate of the American Board of Clinical Lipidology Attending Cardiologist  Direct Dial: [phone]  Fax: [phone]  Website:  [email or web address]   Nadean Corwin Joeanthony Seeling 04/02/2021, 9:22 AM

## 2021-04-06 ENCOUNTER — Encounter: Payer: Self-pay | Admitting: Internal Medicine

## 2021-04-30 ENCOUNTER — Other Ambulatory Visit: Payer: Self-pay

## 2021-04-30 ENCOUNTER — Encounter (HOSPITAL_COMMUNITY): Payer: Self-pay

## 2021-04-30 ENCOUNTER — Ambulatory Visit (HOSPITAL_COMMUNITY)
Admission: EM | Admit: 2021-04-30 | Discharge: 2021-04-30 | Disposition: A | Discharge status: Home / Self Care | Payer: Medicare Other | Attending: Student | Admitting: Student

## 2021-04-30 DIAGNOSIS — J069 Acute upper respiratory infection, unspecified: Secondary | ICD-10-CM | POA: Diagnosis not present

## 2021-04-30 DIAGNOSIS — Z1152 Encounter for screening for COVID-19: Secondary | ICD-10-CM | POA: Insufficient documentation

## 2021-04-30 DIAGNOSIS — Z8679 Personal history of other diseases of the circulatory system: Secondary | ICD-10-CM | POA: Diagnosis not present

## 2021-04-30 DIAGNOSIS — E785 Hyperlipidemia, unspecified: Secondary | ICD-10-CM | POA: Diagnosis not present

## 2021-04-30 LAB — CBG MONITORING, ED: Glucose-Capillary: 137 mg/dL — ABNORMAL HIGH (ref 70–99)

## 2021-04-30 NOTE — Discharge Instructions (Addendum)
-  Tylenol for headaches, bodyaches, fever reduction. You can take up to 1000mg  3x daily. Try to avoid ibuprofen. -Get plenty of rest and drink plenty of fluids.  Eat a bland diet as tolerated. -Will call you in about 1-2 days if your COVID test is positive. -Seek additional immediate medical attention if you develop new symptoms like weakness on one side of your body, chest pain, dizziness, shortness of breath, etc.

## 2021-04-30 NOTE — ED Provider Notes (Signed)
[place]    CSN: [id number] Arrival date & time: 04/30/21  1518      History   Chief Complaint Chief Complaint  Patient presents with  . Weakness  . Nasal Congestion  . Blurred Vision    HPI [name] is a 65 y.o. female presenting with weakness, nasal congestion, blurred vision, sore throat, decreased appetite, dry cough x3 days.  Medical history allergies, asthma, CAD, hypertension, hyperlipidemia, morbid obesity, myocardial infarction, neuropathy. Fully vaccinated for covid19. Has only tried mucinex for control of her symptoms. Watery eyes and attributes 'blurred vision' to this. Denies chest pain, dizziness, shortness of breath, abd pain, n/v/d, focal weakness in arms or legs.  Denies photophobia, foreign body sensation, eye redness, eye crusting in the morning, eye pain, eye pain with movement, injury to eye, vision changes, double vision,  burning eyes   HPI  Past Medical History:  Diagnosis Date  . Acute urinary retention s/p Foley 01/07/2012  . Allergy   . Anemia   . Asthma   . CAD (coronary artery disease)    a. 12/2013 s/p CABG x 5 Dr. Darcey Nora (LIMA to LAD, SVG to diagonal, SVG to OM1, SVG to OM2, SVG to PDA)  . Carotid arterial disease ([place])    a. 12/2013 Carotid U/S: 1-39% bilat ICA stenosis.  . Carpal tunnel syndrome, bilateral   . GERD (gastroesophageal reflux disease)   . Hemorrhoids, internal, with bleeding & prolapse 12/13/2011  . Hernia, abdominal   . History of blood transfusion    CABG and hysterectomy  . History of kidney stones   . Hyperlipidemia   . Hypertension   . Iron deficiency 12-07-2011  . Keloid scar    a. Sternal Keloid s/p CABG with ongoing pain.  . Morbid obesity ([place])   . Myocardial infarct ([place])    2015  . Neuropathy   . Osteoarthritis   . Osteoporosis   . PONV (postoperative nausea and vomiting)   . Seasonal allergies   . Sleep apnea    no CPAP machine; sleep study 02/2010 REM AHI 61.7/hr, total sleep REM 14.8/hr   . Uterine cancer ([place]) 7/15   clinical stage IA grade 1 endometrioid endometrial cancer    Patient Active Problem List   Diagnosis Date Noted  . Low back pain 02/23/2021  . Pes planus of left foot 09/08/2020  . Hallux valgus 09/08/2020  . Tinea corporis 09/08/2020  . Glucose intolerance (impaired glucose tolerance) 07/14/2020  . Vitamin D deficiency 07/14/2020  . Depression 10/16/2018  . Osteoarthritis of left shoulder 10/24/2017  . Peripheral neuropathy 08/19/2017  . Atherosclerosis of aorta ([place]) 06/20/2015  . Osteoarthritis of right knee 11/07/2014  . Health care maintenance 10/09/2014  . Coronary atherosclerosis of native coronary artery 01/05/2014  . Morbid obesity due to excess calories ([place]) 05/10/2012  . Obstructive sleep apnea 03/27/2010  . GERD 08/06/2008  . Hyperlipidemia 10/31/2007  . Essential hypertension 06/30/2007    Past Surgical History:  Procedure Laterality Date  . BREAST REDUCTION SURGERY Bilateral 10/10/2015   Procedure: BILATERAL MAMMARY REDUCTION  (BREAST) WITH FREE NIPPLE GRAFT;  Surgeon: Irene Limbo, MD;  Location: [facility];  Service: Plastics;  Laterality: Bilateral;  . CARDIAC CATHETERIZATION    . CATARACT EXTRACTION Right   . COLONOSCOPY  2009  . COLONOSCOPY W/ BIOPSIES AND POLYPECTOMY    . CORONARY ARTERY BYPASS GRAFT N/A 01/09/2014   Procedure: CORONARY ARTERY BYPASS GRAFTING (CABG) x 5 using left internal mammary artery and right leg greater  saphenous vein harvested endoscopically;  Surgeon: Ivin Poot, MD;  Location: [facility];  Service: Open Heart Surgery;  Laterality: N/A;  please use bed extenders and breast binder  . INTRAOPERATIVE TRANSESOPHAGEAL ECHOCARDIOGRAM N/A 01/09/2014   Procedure: INTRAOPERATIVE TRANSESOPHAGEAL ECHOCARDIOGRAM;  Surgeon: Ivin Poot, MD;  Location: [facility];  Service: Open Heart Surgery;  Laterality: N/A;  . KNEE ARTHROSCOPY  1991  . KNEE SURGERY Bilateral 1999  . LEFT HEART CATHETERIZATION WITH CORONARY  ANGIOGRAM N/A 01/04/2014   Procedure: LEFT HEART CATHETERIZATION WITH CORONARY ANGIOGRAM;  Surgeon: Sinclair Grooms, MD;  Location: [facility] CATH LAB;  Service: Cardiovascular;  Laterality: N/A;  . LESION EXCISION WITH COMPLEX REPAIR N/A 05/07/2016   Procedure: COMPLEX REPAIR OF CHEST 16 CM;  Surgeon: Irene Limbo, MD;  Location: [facility];  Service: Plastics;  Laterality: N/A;  . MULTIPLE EXTRACTIONS WITH ALVEOLOPLASTY N/A 04/11/2014   Procedure: Extraction of tooth #'s 1,2,8,16 with alveoloplasty, maxillary tuberosity reductions, and gross debridement of remaining teeth.;  Surgeon: Lenn Cal, DDS;  Location: [facility];  Service: Oral Surgery;  Laterality: N/A;  . NM MYOCAR PERF WALL MOTION  01/2010   dipyridamole myoview - moderate perfusion defect in basal inferoseptal, basal inferior, mid inferoseptal, mid inferior, apical inferior region; EF 56%  . TEE WITHOUT CARDIOVERSION  01/2010   EF 60-65%, small, flat, non-infiltrating, calcified, fixed apical/septal mass  . TOTAL KNEE ARTHROPLASTY Left 04/29/2011  . transanal hemorrhoidal dearterliaization  01/06/12   with external hemorrhoid removal  . UNILATERAL SALPINGECTOMY Right 06/03/2014   Procedure: UNILATERAL SALPINGECTOMY;  Surgeon: Lavonia Drafts, MD;  Location: [facility];  Service: Gynecology;  Laterality: Right;  Marland Kitchen VAGINAL HYSTERECTOMY N/A 06/03/2014   Procedure: HYSTERECTOMY VAGINAL;  Surgeon: Lavonia Drafts, MD;  Location: [facility];  Service: Gynecology;  Laterality: N/A;  . WISDOM TOOTH EXTRACTION      OB History     Gravida  2   Para  2   Term  2   Preterm  0   AB  0   Living  2      SAB  0   IAB  0   Ectopic  0   Multiple  0   Live Births               Home Medications    Prior to Admission medications   Medication Sig Start Date End Date Taking? Authorizing Provider  albuterol (PROVENTIL HFA;VENTOLIN HFA) 108 (90 Base) MCG/ACT inhaler Inhale 2 puffs into the lungs every 6 (six) hours as needed  for wheezing or shortness of breath. 12/22/18   Kathi Ludwig, MD  amLODipine (NORVASC) 5 MG tablet TAKE 1 TABLET BY MOUTH EVERY DAY 07/30/20   Hilty, Nadean Corwin, MD  aspirin EC 81 MG tablet Take 81 mg by mouth daily.    [provider]  Cholecalciferol (VITAMIN D HIGH POTENCY) 25 MCG (1000 UT) capsule Take 1,000 Units by mouth daily.     [provider]  ciclopirox (PENLAC) 8 % solution Apply topically at bedtime. Apply over nail and surrounding skin. Apply daily over previous coat. After seven (7) days, may remove with alcohol and continue cycle. 11/25/20   Trula Slade, DPM  clotrimazole (LOTRIMIN) 1 % cream Apply 1 application topically 2 (two) times daily. 09/08/20   [provider]  Clotrimazole 1 % OINT Apply twice daily to the affected area on the left leg 09/08/20   Axel Filler, MD  diclofenac Sodium (VOLTAREN) 1 % GEL  Apply 2 g topically 4 (four) times daily. 06/16/20   Axel Filler, MD  esomeprazole (NEXIUM) 20 MG capsule Take 20 mg by mouth every other day. Taking daily    [provider]  fluticasone (FLONASE) 50 MCG/ACT nasal spray PLACE 1 SPRAY INTO BOTH NOSTRILS DAILY. Patient taking differently: Place 1 spray into both nostrils daily as needed for allergies. 07/18/19 08/06/21  Axel Filler, MD  losartan (COZAAR) 100 MG tablet TAKE 1 TABLET BY MOUTH EVERY DAY 03/05/21   Axel Filler, MD  metoprolol tartrate (LOPRESSOR) 25 MG tablet TAKE 1 AND 1/2 TABLETS BY MOUTH TWICE A DAY 03/02/21   Hilty, Nadean Corwin, MD  Multiple Vitamin (MULTIVITAMIN WITH MINERALS) TABS tablet Take 1 tablet by mouth See admin instructions. Take one tablet by mouth on Monday, Wednesday and Fridays    [provider]  Polyethyl Glycol-Propyl Glycol (SYSTANE FREE OP) Apply 1 tablet to eye daily as needed (For dry eyes).    [provider]  rosuvastatin (CRESTOR) 20 MG tablet TAKE 1 TABLET BY MOUTH EVERY DAY 10/28/20    Axel Filler, MD    Family History Family History  Problem Relation Age of Onset  . Heart attack Mother 82  . Heart disease Mother   . Hypertension Mother   . Diabetes Mother   . Hypertension Sister   . Heart failure Sister   . Heart disease Sister   . Diabetes Sister   . Kidney disease Sister   . Kidney disease Brother   . Bipolar disorder Son   . Schizophrenia Son   . Anxiety disorder Son   . Hypertension Daughter   . Esophageal cancer Other        nephew  . Colon cancer Neg Hx   . Pancreatic cancer Neg Hx   . Stomach cancer Neg Hx   . Liver disease Neg Hx   . Rectal cancer Neg Hx     Social History Social History   Tobacco Use  . Smoking status: Never  . Smokeless tobacco: Never  Vaping Use  . Vaping Use: Never used  Substance Use Topics  . Alcohol use: No    Alcohol/week: 0.0 standard drinks  . Drug use: No     Allergies   Cephalosporins, Eicosapentaenoic acid (epa), Fish-derived products, Peanuts [peanut oil], Shrimp [shellfish allergy], Latex, Metronidazole, Ciprofloxacin, Lisinopril, Naproxen, Other, and Prednisone   Review of Systems Review of Systems  Constitutional:  Positive for fatigue. Negative for appetite change, chills and fever.  HENT:  Positive for congestion. Negative for ear pain, rhinorrhea, sinus pressure, sinus pain and sore throat.   Eyes:  Negative for redness and visual disturbance.  Respiratory:  Positive for cough. Negative for chest tightness, shortness of breath and wheezing.   Cardiovascular:  Negative for chest pain and palpitations.  Gastrointestinal:  Negative for abdominal pain, constipation, diarrhea, nausea and vomiting.  Genitourinary:  Negative for dysuria, frequency and urgency.  Musculoskeletal:  Negative for myalgias.  Neurological:  Negative for dizziness, weakness and headaches.  Psychiatric/Behavioral:  Negative for confusion.   All other systems reviewed and are negative.   Physical Exam Triage  Vital Signs ED Triage Vitals  Enc Vitals Group     BP 04/30/21 1625 130/81     Pulse Rate 04/30/21 1625 79     Resp 04/30/21 1625 20     Temp 04/30/21 1625 99.9 F (37.7 C)     Temp Source 04/30/21 1625 Oral     SpO2  04/30/21 1625 96 %     Weight --      Height --      Head Circumference --      Peak Flow --      Pain Score 04/30/21 1623 8     Pain Loc --      Pain Edu? --      Excl. in [place]? --    No data found.  Updated Vital Signs BP 130/81 (BP Location: Left Arm)   Pulse 79   Temp 99.9 F (37.7 C) (Oral)   Resp 20   LMP 01/13/2014 Comment: spotting in Feb and bleeding since April  SpO2 96%   Visual Acuity Right Eye Distance:   Left Eye Distance:   Bilateral Distance:    Right Eye Near:   Left Eye Near:    Bilateral Near:     Physical Exam Vitals reviewed.  Constitutional:      General: She is not in acute distress.    Appearance: Normal appearance. She is not ill-appearing.  HENT:     Head: Normocephalic and atraumatic.     Right Ear: Hearing, tympanic membrane, ear canal and external ear normal. No swelling or tenderness. There is no impacted cerumen. No mastoid tenderness. Tympanic membrane is not perforated, erythematous, retracted or bulging.     Left Ear: Hearing, tympanic membrane, ear canal and external ear normal. No swelling or tenderness. There is no impacted cerumen. No mastoid tenderness. Tympanic membrane is not perforated, erythematous, retracted or bulging.     Nose:     Right Sinus: No maxillary sinus tenderness or frontal sinus tenderness.     Left Sinus: No maxillary sinus tenderness or frontal sinus tenderness.     Mouth/Throat:     Mouth: Mucous membranes are moist.     Pharynx: Uvula midline. No oropharyngeal exudate or posterior oropharyngeal erythema.     Tonsils: No tonsillar exudate.  Cardiovascular:     Rate and Rhythm: Normal rate and regular rhythm.     Heart sounds: Normal heart sounds.  Pulmonary:     Breath sounds: Normal  breath sounds and air entry. No wheezing, rhonchi or rales.  Chest:     Chest wall: No tenderness.     Comments: Well healed surgical scar overlying sternum Abdominal:     General: Abdomen is flat. Bowel sounds are normal.     Tenderness: There is no abdominal tenderness. There is no guarding or rebound.  Lymphadenopathy:     Cervical: No cervical adenopathy.  Neurological:     General: No focal deficit present.     Mental Status: She is alert and oriented to person, place, and time.     Cranial Nerves: Cranial nerves are intact. No cranial nerve deficit.     Sensory: Sensation is intact. No sensory deficit.     Motor: Motor function is intact. No weakness.     Coordination: Coordination is intact.     Comments: CN 2-12 grossly intact Strength 5/5 in Ues and Les, sensation intact   Psychiatric:        Attention and Perception: Attention and perception normal.        Mood and Affect: Mood and affect normal.        Behavior: Behavior normal. Behavior is cooperative.        Thought Content: Thought content normal.        Judgment: Judgment normal.     UC Treatments / Results  Labs (all labs ordered are  listed, but only abnormal results are displayed) Labs Reviewed  CBG MONITORING, ED - Abnormal; Notable for the following components:      Result Value   Glucose-Capillary 137 (*)    All other components within normal limits  SARS CORONAVIRUS 2 (TAT 6-24 HRS)    EKG   Radiology No results found.  Procedures Procedures (including critical care time)  Medications Ordered in UC Medications - No data to display  Initial Impression / Assessment and Plan / UC Course  I have reviewed the triage vital signs and the nursing notes.  Pertinent labs & imaging results that were available during my care of the patient were reviewed by me and considered in my medical decision making (see chart for details).     This patient is a 66 year old female presenting with viral URI. Today  this pt is afebrile nontachycardic nontachypneic, oxygenating well on room air, no wheezes rhonchi or rales. Benign neuro exam.   Nonfasting CBG 137. She is not a diabetic.  History CAD, CABG. EKG with NSR, unchanged from 12/2020 EKG.   Covid PCR sent. She is vaccinated for covid19. If positive for covid-19, she has had 4 days of symptoms and had a normal BMP 12/2020, would be eligible for Paxlovid. She is a well controlled asmatic, continue albuterol inhaler.  ED return precautions discussed.   Final Clinical Impressions(s) / UC Diagnoses   Final diagnoses:  Viral URI with cough  Encounter for screening for COVID-19  History of coronary artery disease     Discharge Instructions      -Tylenol for headaches, bodyaches, fever reduction. You can take up to 1000mg  3x daily. Try to avoid ibuprofen. -Get plenty of rest and drink plenty of fluids.  Eat a bland diet as tolerated. -Will call you in about 1-2 days if your COVID test is positive. -Seek additional immediate medical attention if you develop new symptoms like weakness on one side of your body, chest pain, dizziness, shortness of breath, etc.     ED Prescriptions   None    PDMP not reviewed this encounter.   [place], Bott[place], PA-C 04/30/21 [phone]

## 2021-04-30 NOTE — ED Triage Notes (Signed)
Pt reports weakness, headache, blurred vision, low appetite, scratchy throat, dry cough and nasal congestion x 3 days.  Pt denies chest pain at this moment.

## 2021-05-01 LAB — SARS CORONAVIRUS 2 (TAT 6-24 HRS): SARS Coronavirus 2: POSITIVE — AB

## 2021-05-15 ENCOUNTER — Telehealth: Payer: Self-pay | Admitting: Internal Medicine

## 2021-05-15 MED ORDER — METOPROLOL TARTRATE 25 MG PO TABS: 25.0000 mg | ORAL_TABLET | Freq: Two times a day (BID) | ORAL | 0 refills | Status: DC

## 2021-05-15 NOTE — Telephone Encounter (Signed)
Would stick with 25 mg BID lopressor for now unless she becomes symptomatic or HR is routinely in the 40's, then notify us and I could decrease the dose.  Dr Lemmie Evens

## 2021-05-15 NOTE — Telephone Encounter (Signed)
Returned call to patient, made patient aware of Dr. Lysbeth Penner recommendations. Patient verbalized understanding.   [name], MD  You 51 minutes ago (4:14 PM)    Would stick with 25 mg BID lopressor for now unless she becomes symptomatic or HR is routinely in the 40's, then notify us and I could decrease the dose.       Advised patient to call back to office with any issues, questions, or concerns. Patient verbalized understanding.

## 2021-05-15 NOTE — Telephone Encounter (Signed)
Medication list updated.

## 2021-05-15 NOTE — Telephone Encounter (Signed)
Returned call to patient, who states that she has been experiencing low heart rates. Patient states that her heart rate when she checks it with her pulse oximeter that last couple of weeks has been 49bpm-57bpm. Patient states that it does come up when she moves around or with any activity. Patient states that she feels well and denies any shortness of breath, swelling, palpitations, dizziness, or lightheadedness. Patient states that she have some incisional pain in her chest at times. Patient states that its related to her keloid on her incision and states it is not cardiac related. Patient states that she does not have any  other symptoms.  Patient reports that she has recently lost weight since switching to a vegetarian  diet. Patient would also states that she is taking Metoprolol 25mg  (1) tablet TWICE daily instead of the ordered 1 and 1/2 tablet that is on medication list.   Patient states would like to know if her medication needs to be adjusted. Advised patient that I would forward her message to Dr. Debara Pickett for him to review.   Made patient aware of ED precautions should new or worsening symptoms develop. Patient verbalized understanding.

## 2021-05-15 NOTE — Addendum Note (Signed)
Addended by: Rexanne Mano B on: 05/15/2021 05:15 PM   Modules accepted: Orders

## 2021-05-15 NOTE — Telephone Encounter (Signed)
Pt c/o medication issue:  1. Name of Medication: metoprolol tartrate (LOPRESSOR) 25 MG tablet amLODipine (NORVASC) 5 MG tablet  2. How are you currently taking this medication (dosage and times per day)? As prescribed  3. Are you having a reaction (difficulty breathing--STAT)? no  4. What is your medication issue? Pt feels that these medications are slowing down her heart rate.

## 2021-05-18 ENCOUNTER — Telehealth: Payer: Self-pay | Admitting: *Deleted

## 2021-05-18 NOTE — Telephone Encounter (Signed)
Received request from NT to call patient for positive Covid test. Patient tested positive on 6/9. States she feels like she is recovering well. Has some lingering chest congestion for which she is taking Mucinex. Reports very little cough. Wondering if there is any other meds she should be taking. Advised drinking plenty of fluids, especially water to thin secretions, and to continue taking Mucinex. She will call back if congestion worsens or develops SHOB. Patient also has albuterol inhaler that she can take prn.

## 2021-05-26 ENCOUNTER — Encounter: Payer: Self-pay | Admitting: *Deleted

## 2021-05-28 ENCOUNTER — Other Ambulatory Visit: Payer: Self-pay

## 2021-05-28 ENCOUNTER — Ambulatory Visit (HOSPITAL_COMMUNITY)
Admission: RE | Admit: 2021-05-28 | Discharge: 2021-05-28 | Disposition: A | Discharge status: Home / Self Care | Payer: Medicare Other | Source: Ambulatory Visit | Attending: Student in an Organized Health Care Education/Training Program | Admitting: Student in an Organized Health Care Education/Training Program

## 2021-05-28 DIAGNOSIS — M545 Low back pain, unspecified: Secondary | ICD-10-CM | POA: Diagnosis not present

## 2021-05-31 IMAGING — US US AXILLARY RIGHT
1 series · 4 of 4 positions shown · non-contrast
Comparison: Previous exam(s).

CLINICAL DATA: 64-year-old female presenting as a recall from
screening for possible enlarged right axillary lymph nodes.

EXAM:
ULTRASOUND OF THE RIGHT BREAST

[Series 1: us axillary right · 0.10mm/px · 4 of 4 slices shown]
[im 1/4]
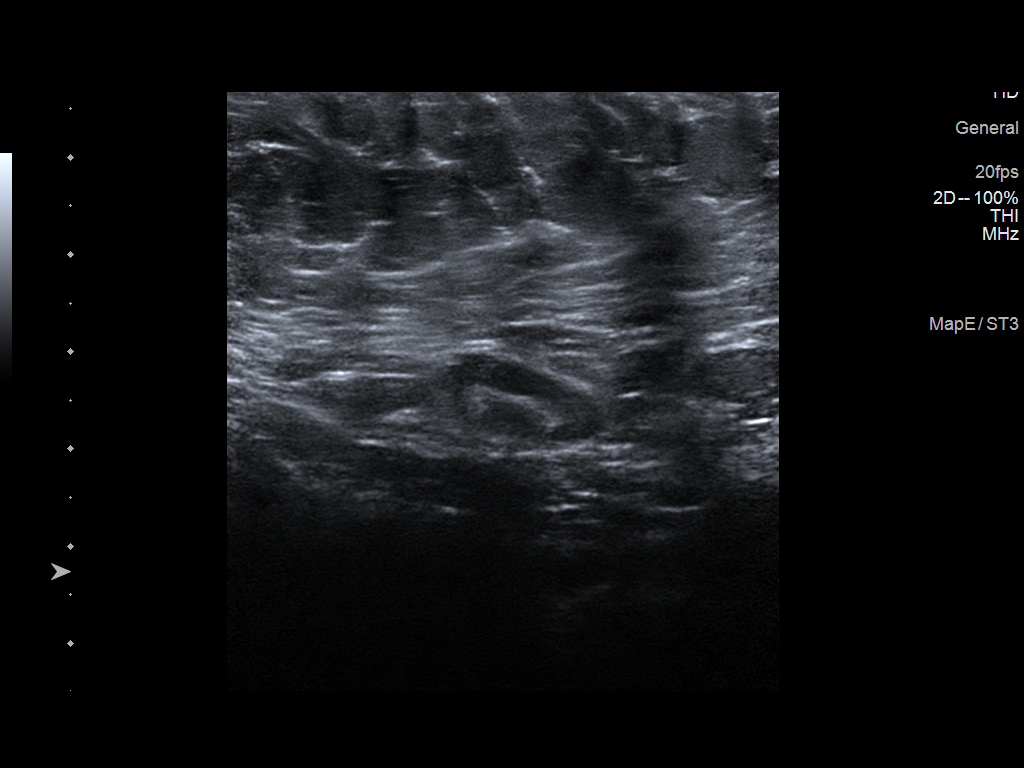
[im 2/4]
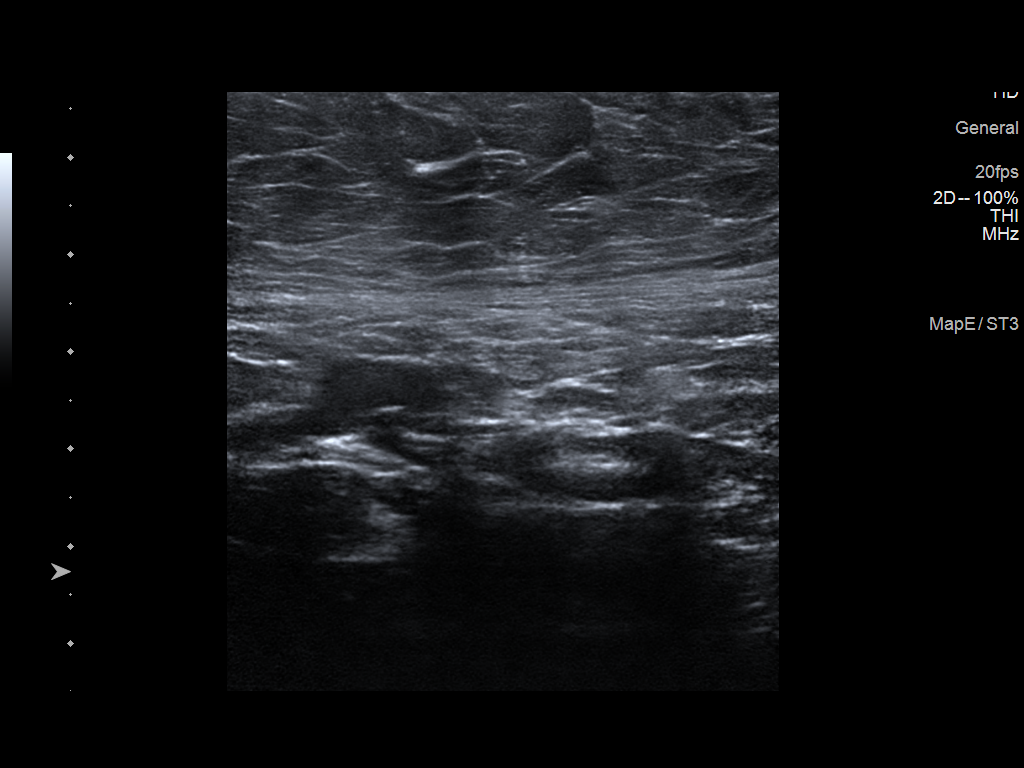
[im 3/4]
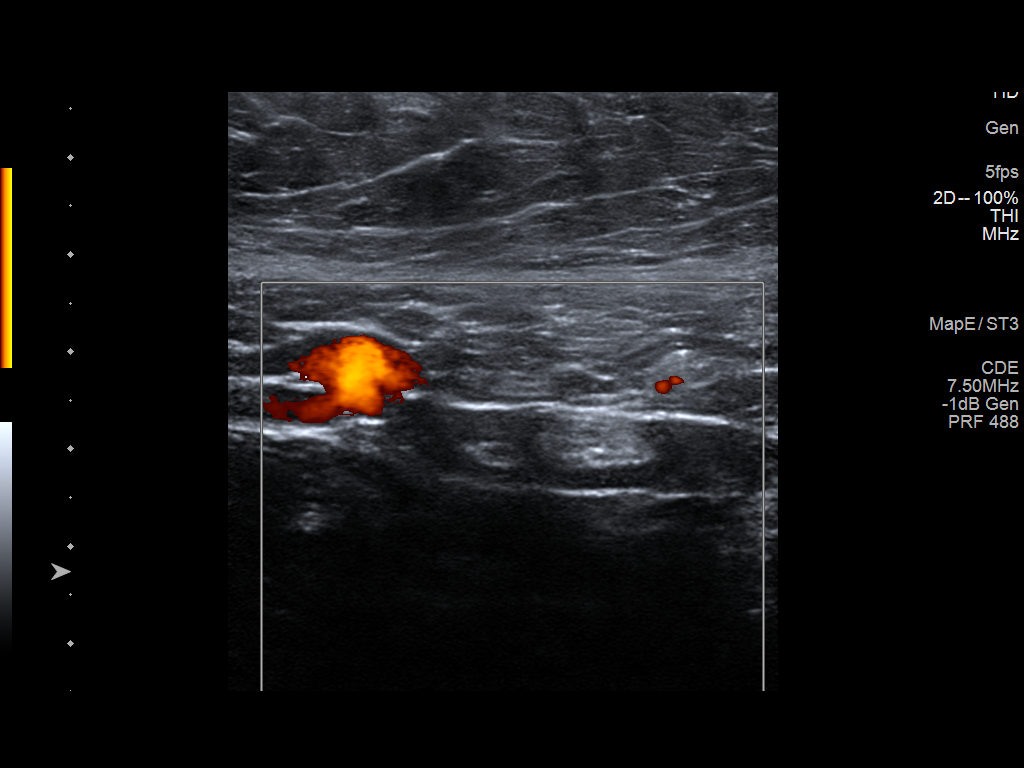
[im 4/4]
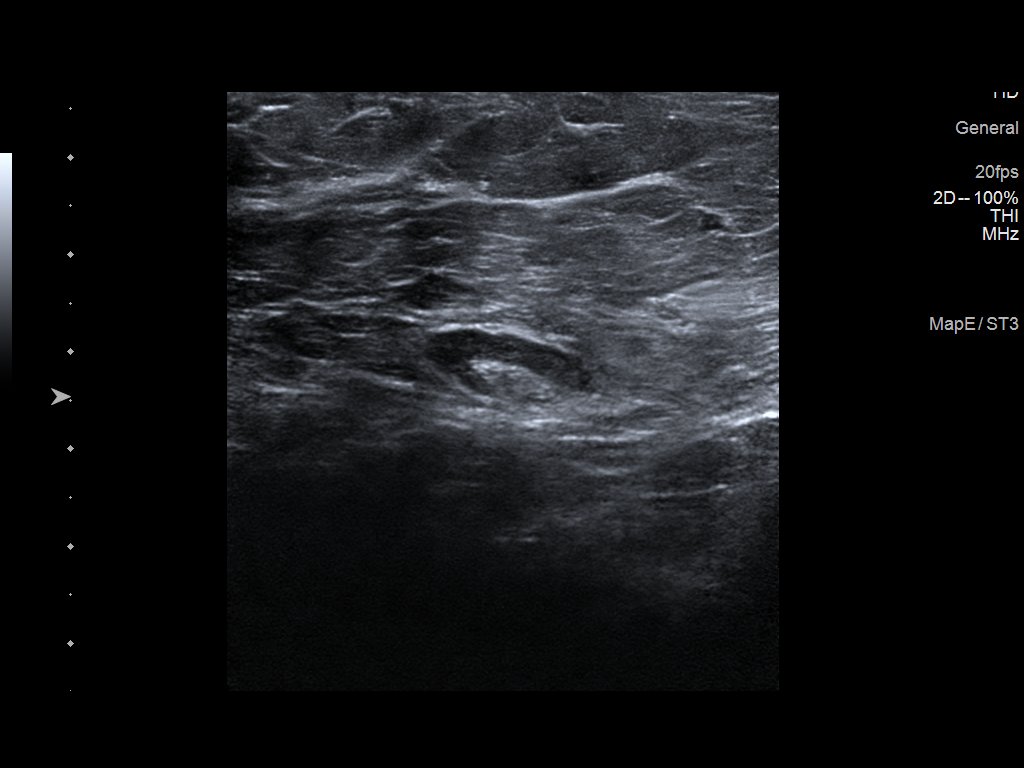

[4 of 4 positions shown; findings below may reference images not displayed]

FINDINGS: Targeted ultrasound is performed throughout the right axillary
region demonstrating normal lymph nodes with retained fatty hila and
thin cortices. There is no enlarged lymph node or suspicious mass.
IMPRESSION: Normal right axillary ultrasound.

RECOMMENDATION:
Screening mammogram in one year.(Code:[id number])

I have discussed the findings and recommendations with the patient.
If applicable, a reminder letter will be sent to the patient
regarding the next appointment.

BI-RADS CATEGORY  1: Negative.

## 2021-06-08 ENCOUNTER — Ambulatory Visit: Payer: Medicare Other | Admitting: Podiatry

## 2021-06-08 ENCOUNTER — Other Ambulatory Visit: Payer: Self-pay

## 2021-06-08 ENCOUNTER — Telehealth: Payer: Self-pay | Admitting: Student in an Organized Health Care Education/Training Program

## 2021-06-08 NOTE — Telephone Encounter (Signed)
I spoke with patient about MRI lumbar spine results. She was having low back pain with high risk features of urine and bowel incontinence. MRI is reassuring, no high grade spinal stenosis, no tumors of infection to cause cauda equina. There is moderate osteoarthritis as expected for her. She reports the incontinence has spontaneously resolved. The low back pain has been intermittent, sometimes function limiting, but overall doing ok. We talked about duloxetine, but she has tried before and had some headache as a side effect. I think it is ok to continue with as needed NSAIDs, I recommended only using for 1-2 days at a time given she also uses Losartan.

## 2021-06-30 DIAGNOSIS — H15113 Episcleritis periodica fugax, bilateral: Secondary | ICD-10-CM | POA: Diagnosis not present

## 2021-06-30 DIAGNOSIS — H53021 Refractive amblyopia, right eye: Secondary | ICD-10-CM | POA: Diagnosis not present

## 2021-06-30 DIAGNOSIS — R519 Headache, unspecified: Secondary | ICD-10-CM | POA: Diagnosis not present

## 2021-06-30 DIAGNOSIS — H43823 Vitreomacular adhesion, bilateral: Secondary | ICD-10-CM | POA: Diagnosis not present

## 2021-06-30 DIAGNOSIS — Z961 Presence of intraocular lens: Secondary | ICD-10-CM | POA: Diagnosis not present

## 2021-06-30 DIAGNOSIS — H04123 Dry eye syndrome of bilateral lacrimal glands: Secondary | ICD-10-CM | POA: Diagnosis not present

## 2021-06-30 DIAGNOSIS — H25812 Combined forms of age-related cataract, left eye: Secondary | ICD-10-CM | POA: Diagnosis not present

## 2021-07-02 DIAGNOSIS — R519 Headache, unspecified: Secondary | ICD-10-CM | POA: Diagnosis not present

## 2021-07-03 ENCOUNTER — Telehealth: Payer: Self-pay

## 2021-07-03 ENCOUNTER — Telehealth: Payer: Self-pay | Admitting: Internal Medicine

## 2021-07-03 ENCOUNTER — Telehealth (INDEPENDENT_AMBULATORY_CARE_PROVIDER_SITE_OTHER): Payer: Medicare Other | Admitting: Internal Medicine

## 2021-07-03 DIAGNOSIS — H539 Unspecified visual disturbance: Secondary | ICD-10-CM

## 2021-07-03 DIAGNOSIS — M316 Other giant cell arteritis: Secondary | ICD-10-CM | POA: Diagnosis not present

## 2021-07-03 DIAGNOSIS — R519 Headache, unspecified: Secondary | ICD-10-CM | POA: Diagnosis not present

## 2021-07-03 MED ORDER — PREDNISONE 20 MG PO TABS: 60.0000 mg | ORAL_TABLET | Freq: Every day | ORAL | 0 refills | Status: AC

## 2021-07-03 NOTE — Assessment & Plan Note (Addendum)
This was a telephone encounter with Ms. Spampinato for concern of giant cell arteritis.    Patient has a new headache when she wakes up in the morning for 3 weeks which gradually improves during the day. The headache never goes completely away. The headache is across patient's forehead and started on the left side of her forehead, sometimes on the right, and then moved back to the left over the 3 weeks.  She denies pain over the temporal region.  The pain is characterized as constant pressing headache. Currently the pain is located in the center of here head.  She thought she had a sinus infection and has tried relieving it with lemon which has helped her before.  She has not tried ibuprofen or Tylenol.  She has had associated vision changes, but has been having a hard time adjusting to her new bifocal glasses since April.  Dr. Katy Fitch, Opthalmologist, evaluated patient this week and had concern for GCA. ESR reported through RN to be >100, labs were collected at [facility] and not in our system. ( I asked patient to obtain a copy).   Assessment/Plan:  Patient is 13, has new headache, possible related vision changes and elevated ESR. Starting prednisone 60 mg/day for giant cell arteritis. I Spoke to Dr. Trula Slade with vascular surgery and greatly appreciate him working her into his schedule quickly. She will be seen in his office on Monday and scheduled for temporal biopsy on Tuesday. Requesting for patient to be scheduled next week in [facility] for follow up of her symptoms and to check ESR. If evaluation with treatment and biopsy do not suggest continued treatment will broaden differential for new headache in senior patient.

## 2021-07-03 NOTE — Telephone Encounter (Addendum)
Spoke with Dr. Katy Fitch. He saw patient in the office yesterday with visual complaints but eye exam was completely normal. She was also complaining of headaches, neck, and jaw pain. SED rate was checked and elevated to 100. He is concerned for temporal arteritis. Dr. Court Joy is calling patient to follow up on symptoms. Will go ahead and start steroid treatment for presumed GCA and call vascular surgery to arrange biopsy.

## 2021-07-03 NOTE — Telephone Encounter (Signed)
  [facility] Internal Medicine Residency Telephone Encounter Continuity Care Appointment  HPI:  This telephone encounter was created for [name] on 07/03/2021 for the following purpose/cc headache and vision changes.   Past Medical History:  Past Medical History:  Diagnosis Date   Acute urinary retention s/p Foley 01/07/2012   Allergy    Anemia    Asthma    CAD (coronary artery disease)    a. 12/2013 s/p CABG x 5 Dr. Darcey Nora (LIMA to LAD, SVG to diagonal, SVG to OM1, SVG to OM2, SVG to PDA)   Carotid arterial disease ([place])    a. 12/2013 Carotid U/S: 1-39% bilat ICA stenosis.   Carpal tunnel syndrome, bilateral    GERD (gastroesophageal reflux disease)    Hemorrhoids, internal, with bleeding & prolapse 12/13/2011   Hernia, abdominal    History of blood transfusion    CABG and hysterectomy   History of kidney stones    Hyperlipidemia    Hypertension    Iron deficiency 12-07-2011   Keloid scar    a. Sternal Keloid s/p CABG with ongoing pain.   Morbid obesity ([place])    Myocardial infarct ([place])    2015   Neuropathy    Osteoarthritis    Osteoporosis    PONV (postoperative nausea and vomiting)    Seasonal allergies    Sleep apnea    no CPAP machine; sleep study 02/2010 REM AHI 61.7/hr, total sleep REM 14.8/hr   Uterine cancer ([place]) 7/15   clinical stage IA grade 1 endometrioid endometrial cancer     ROS:  Negative for fever ,chills, chest pain, sob, joint pain, jaw pain, difficulty chewing, numbness or tingling in hands or feet, focal weakness, dysuria, constipation, and vision loss.   Positive for dental pain (sensitive tooth or right) , headache  Assessment / Plan / Recommendations:  Please see A&P under problem oriented charting for assessment of the patient's acute and chronic medical conditions.  As always, pt is advised that if symptoms worsen or new symptoms arise, they should go to an urgent care facility or to to ER for further evaluation.   Consent and  Medical Decision Making:  Patient discussed with Dr. Philipp Ovens This is a telephone encounter between [name] and [name] on 07/03/2021 for [facility]. The visit was conducted with the patient located at home and [name] at [facility]. The patient's identity was confirmed using their DOB and current address. The patient has consented to being evaluated through a telephone encounter and understands the associated risks (an examination cannot be done and the patient may need to come in for an appointment) / benefits (allows the patient to remain at home, decreasing exposure to coronavirus). I personally spent 18 minutes on medical discussion.

## 2021-07-03 NOTE — Telephone Encounter (Signed)
Called Ms.Esselman x2 and unable to reach her. I left a voicemail asking her to return my call this morning.

## 2021-07-03 NOTE — Telephone Encounter (Signed)
Received a TC from Dr. Warden Fillers who states he saw this patient yesterday, may have been day before yesterday.  Dr. Katy Fitch states "I saw her for eye pain, the eye looked fine.  She was c/o weight loss, jaw pain, pain in her hips and her temporal sed rate was increased to 100.  I am worried this might be temporal arteritis and if so, she needs to be started on prednisone right away along with a f/u for a temporal artery Bx.  I believe she has some type of hx of endometrial ca and am wondering if this might be showing it's ugly head again".  Dr. Katy Fitch states he is o/c and if PCP wants to discuss this with him, to please call him.  His personal cell phone number is [phone].  Dr. Evette Doffing paged and RN will also send message to him via Epic. SChaplin, RN,BSN

## 2021-07-03 NOTE — Telephone Encounter (Signed)
Will reroute message to attending in office today, Dr. Philipp Ovens.  She states she will call Dr. Katy Fitch. SChaplin, RN,BSN

## 2021-07-06 ENCOUNTER — Ambulatory Visit (INDEPENDENT_AMBULATORY_CARE_PROVIDER_SITE_OTHER): Payer: Medicare Other | Admitting: Surgery

## 2021-07-06 ENCOUNTER — Other Ambulatory Visit: Payer: Self-pay

## 2021-07-06 ENCOUNTER — Encounter: Payer: Self-pay | Admitting: Surgery

## 2021-07-06 VITALS — BP 113/74 | HR 70 | Temp 98.2°F | Resp 20 | Ht 68.0 in | Wt 283.0 lb

## 2021-07-06 DIAGNOSIS — M316 Other giant cell arteritis: Secondary | ICD-10-CM | POA: Diagnosis not present

## 2021-07-06 NOTE — H&P (View-Only) (Signed)
[facility]ist of [place]  Patient name: [name] MRN: [id number] DOB: 1956-05-13 Sex: female   REQUESTING PROVIDER:    Dr. Court Joy   REASON FOR CONSULT:    Temporal artery biopsy  HISTORY OF PRESENT ILLNESS:   VELA Griffin is a 65 y.o. female, who is referred for evaluation of possible temporal arteritis.  She states that she began having headaches and some blurry vision.  She was evaluated by Dr. Carolynn Sayers who ordered a sed rate which was elevated greater than 100.  I have been asked to provide biopsy.  She has been started on steroids, however she has not been compliant because they make her jittery.  She does feel that she is getting better.  She also states that she has had some pain in her right side but that it bounces back and forth.  Patient suffers from coronary artery disease, status post CABG in 2015.  She is medically managed for hypertension.  She takes a statin for hypercholesterolemia.  PAST MEDICAL HISTORY    Past Medical History:  Diagnosis Date   Acute urinary retention s/p Foley 01/07/2012   Allergy    Anemia    Asthma    CAD (coronary artery disease)    a. 12/2013 s/p CABG x 5 Dr. Darcey Nora (LIMA to LAD, SVG to diagonal, SVG to OM1, SVG to OM2, SVG to PDA)   Carotid arterial disease ([place])    a. 12/2013 Carotid U/S: 1-39% bilat ICA stenosis.   Carpal tunnel syndrome, bilateral    GERD (gastroesophageal reflux disease)    Hemorrhoids, internal, with bleeding & prolapse 12/13/2011   Hernia, abdominal    History of blood transfusion    CABG and hysterectomy   History of kidney stones    Hyperlipidemia    Hypertension    Iron deficiency 12-07-2011   Keloid scar    a. Sternal Keloid s/p CABG with ongoing pain.   Morbid obesity ([place])    Myocardial infarct ([place])    2015   Neuropathy    Osteoarthritis    Osteoporosis    PONV (postoperative nausea and vomiting)    Seasonal allergies    Sleep apnea    no  CPAP machine; sleep study 02/2010 REM AHI 61.7/hr, total sleep REM 14.8/hr   Uterine cancer ([place]) 7/15   clinical stage IA grade 1 endometrioid endometrial cancer     FAMILY HISTORY   Family History  Problem Relation Age of Onset   Heart attack Mother 14   Heart disease Mother    Hypertension Mother    Diabetes Mother    Hypertension Sister    Heart failure Sister    Heart disease Sister    Diabetes Sister    Kidney disease Sister    Kidney disease Brother    Bipolar disorder Son    Schizophrenia Son    Anxiety disorder Son    Hypertension Daughter    Esophageal cancer Other        nephew   Colon cancer Neg Hx    Pancreatic cancer Neg Hx    Stomach cancer Neg Hx    Liver disease Neg Hx    Rectal cancer Neg Hx     SOCIAL HISTORY:   Social History   Socioeconomic History   Marital status: Single    Spouse name: Not on file   Number of children: 2   Years of education: Not on file   Highest education level: Not on file  Occupational History   Occupation: Retired    Fish farm manager: [employer]    Comment: Chiropodist  Tobacco Use   Smoking status: Never   Smokeless tobacco: Never  Vaping Use   Vaping Use: Never used  Substance and Sexual Activity   Alcohol use: No    Alcohol/week: 0.0 standard drinks   Drug use: No   Sexual activity: Not Currently  Other Topics Concern   Not on file  Social History Narrative   Current Social History 08/27/2020        Patient lives alone in a handicap-accessible ground floor apartment which is 1 story. There are not steps up to the entrance the patient uses.       Patient's method of transportation is personal car or family member.      The highest level of education was 2 years of college.      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 [employer]      Right Handed      Identified important Relationships are My grandchildren, daughter, son       Pets : None       Interests / Fun:  Plants       Current Stressors: Son with special needs (Bi-polar/schizophrenia) , he is not living with her        Religious / Personal Beliefs: [employer] mentors Restaurant manager, fast food)      Therapist, art, RN,BSN          Social Determinants of Health   Financial Resource Strain: Not on file  Food Insecurity: Not on file  Transportation Needs: Not on file  Physical Activity: Not on file  Stress: Not on file  Social Connections: Not on file  Intimate Partner Violence: Not on file    ALLERGIES:    Allergies  Allergen Reactions   Cephalosporins Anaphylaxis, Swelling and Other (See Comments)    Tongue swelling, gum pain   Eicosapentaenoic Acid (Epa) Anaphylaxis and Shortness Of Breath   Fish-Derived Products Anaphylaxis and Shortness Of Breath   Peanuts [Peanut Oil] Anaphylaxis, Shortness Of Breath and Other (See Comments)    Peanut butter   Shrimp [Shellfish Allergy] Anaphylaxis    Pt states her throat will swell if she eats shrimp.   Latex Itching   Metronidazole Other (See Comments)    Palpitations, mild SOB, metallic taste, dry mouth, high blood pressure   Ciprofloxacin Other (See Comments)    Gaging and achy   Lisinopril Cough        Naproxen Nausea Only and Other (See Comments)    Headache   Other Itching and Other (See Comments)     All pain meds make her itch - has to have something to prevent that in addition to receiving med   Prednisone Other (See Comments)    Heart beating fast Ok to take low dosage      CURRENT MEDICATIONS:    Current Outpatient Medications  Medication Sig Dispense Refill   albuterol (PROVENTIL HFA;VENTOLIN HFA) 108 (90 Base) MCG/ACT inhaler Inhale 2 puffs into the lungs every 6 (six) hours as needed for wheezing or shortness of breath. 1 Inhaler 0   amLODipine (NORVASC) 5 MG tablet TAKE 1 TABLET BY MOUTH EVERY DAY 90 tablet 3   aspirin EC 81 MG tablet Take 81 mg by mouth daily.     Cholecalciferol (VITAMIN D HIGH POTENCY) 25 MCG (1000 UT)  capsule Take 1,000 Units by mouth daily.      ciclopirox (  PENLAC) 8 % solution Apply topically at bedtime. Apply over nail and surrounding skin. Apply daily over previous coat. After seven (7) days, may remove with alcohol and continue cycle. 6.6 mL 2   clotrimazole (LOTRIMIN) 1 % cream Apply 1 application topically 2 (two) times daily.     Clotrimazole 1 % OINT Apply twice daily to the affected area on the left leg 56.7 g 0   diclofenac Sodium (VOLTAREN) 1 % GEL Apply 2 g topically 4 (four) times daily. 150 g 2   esomeprazole (NEXIUM) 20 MG capsule Take 20 mg by mouth every other day. Taking daily     fluticasone (FLONASE) 50 MCG/ACT nasal spray PLACE 1 SPRAY INTO BOTH NOSTRILS DAILY. (Patient taking differently: Place 1 spray into both nostrils daily as needed for allergies.) 16 mL 2   losartan (COZAAR) 100 MG tablet TAKE 1 TABLET BY MOUTH EVERY DAY 90 tablet 3   metoprolol tartrate (LOPRESSOR) 25 MG tablet Take 1 tablet (25 mg total) by mouth 2 (two) times daily. 180 tablet 0   Multiple Vitamin (MULTIVITAMIN WITH MINERALS) TABS tablet Take 1 tablet by mouth See admin instructions. Take one tablet by mouth on Monday, Wednesday and Fridays     Polyethyl Glycol-Propyl Glycol (SYSTANE FREE OP) Apply 1 tablet to eye daily as needed (For dry eyes).     rosuvastatin (CRESTOR) 20 MG tablet TAKE 1 TABLET BY MOUTH EVERY DAY 90 tablet 3   predniSONE (DELTASONE) 20 MG tablet Take 3 tablets (60 mg total) by mouth daily with breakfast for 14 days. (Patient not taking: Reported on 07/06/2021) 42 tablet 0   No current facility-administered medications for this visit.    REVIEW OF SYSTEMS:   '[X]'$  denotes positive finding, '[ ]'$  denotes negative finding Cardiac  Comments:  Chest pain or chest pressure:    Shortness of breath upon exertion:    Short of breath when lying flat:    Irregular heart rhythm: x       Vascular    Pain in calf, thigh, or hip brought on by ambulation:    Pain in feet at night that  wakes you up from your sleep:     Blood clot in your veins:    Leg swelling:         Pulmonary    Oxygen at home:    Productive cough:     Wheezing:         Neurologic    Sudden weakness in arms or legs:     Sudden numbness in arms or legs:     Sudden onset of difficulty speaking or slurred speech:    Temporary loss of vision in one eye:     Problems with dizziness:         Gastrointestinal    Blood in stool:      Vomited blood:         Genitourinary    Burning when urinating:     Blood in urine:        Psychiatric    Major depression:         Hematologic    Bleeding problems:    Problems with blood clotting too easily:        Skin    Rashes or ulcers:        Constitutional    Fever or chills:     PHYSICAL EXAM:   Vitals:   07/06/21 1531  BP: 113/74  Pulse: 70  Resp: 20  Temp:  98.2 F (36.8 C)  SpO2: 96%  Weight: 283 lb (128.4 kg)  Height: '5\' 8"'$  (1.727 m)    GENERAL: The patient is a well-nourished female, in no acute distress. The vital signs are documented above. CARDIAC: There is a regular rate and rhythm.  VASCULAR: Palpable pulse over top of her left temporal artery MUSCULOSKELETAL: There are no major deformities or cyanosis. NEUROLOGIC: No focal weakness or paresthesias are detected. SKIN: There are no ulcers or rashes noted. PSYCHIATRIC: The patient has a normal affect.  STUDIES:   None  ASSESSMENT and PLAN   Possible temporal arteritis: I discussed with the patient that our role in this is to provide a biopsy sample so that the diagnosis can be made by pathology.  I discussed that she would likely require some shaving of her hair to get to her temporal artery.  We would only do the left side.  We will try to get this done as soon as possible.  All questions were answered   Leia Alf, MD, FACS [facility] [phone] Pager [phone]

## 2021-07-06 NOTE — Progress Notes (Signed)
Vascular and Vein Specialist of [place]  Patient name: [name] MRN: [id number] DOB: 09/13/1956 Sex: female   REQUESTING PROVIDER:    Dr. Court Joy   REASON FOR CONSULT:    Temporal artery biopsy  HISTORY OF PRESENT ILLNESS:   [name] is a 65 y.o. female, who is referred for evaluation of possible temporal arteritis.  She states that she began having headaches and some blurry vision.  She was evaluated by Dr. Carolynn Sayers who ordered a sed rate which was elevated greater than 100.  I have been asked to provide biopsy.  She has been started on steroids, however she has not been compliant because they make her jittery.  She does feel that she is getting better.  She also states that she has had some pain in her right side but that it bounces back and forth.  Patient suffers from coronary artery disease, status post CABG in 2015.  She is medically managed for hypertension.  She takes a statin for hypercholesterolemia.  PAST MEDICAL HISTORY    Past Medical History:  Diagnosis Date   Acute urinary retention s/p Foley 01/07/2012   Allergy    Anemia    Asthma    CAD (coronary artery disease)    a. 12/2013 s/p CABG x 5 Dr. Darcey Nora (LIMA to LAD, SVG to diagonal, SVG to OM1, SVG to OM2, SVG to PDA)   Carotid arterial disease ([place])    a. 12/2013 Carotid U/S: 1-39% bilat ICA stenosis.   Carpal tunnel syndrome, bilateral    GERD (gastroesophageal reflux disease)    Hemorrhoids, internal, with bleeding & prolapse 12/13/2011   Hernia, abdominal    History of blood transfusion    CABG and hysterectomy   History of kidney stones    Hyperlipidemia    Hypertension    Iron deficiency 12-07-2011   Keloid scar    a. Sternal Keloid s/p CABG with ongoing pain.   Morbid obesity ([place])    Myocardial infarct ([place])    2015   Neuropathy    Osteoarthritis    Osteoporosis    PONV (postoperative nausea and vomiting)    Seasonal allergies    Sleep apnea    no  CPAP machine; sleep study 02/2010 REM AHI 61.7/hr, total sleep REM 14.8/hr   Uterine cancer ([place]) 7/15   clinical stage IA grade 1 endometrioid endometrial cancer     FAMILY HISTORY   Family History  Problem Relation Age of Onset   Heart attack Mother 17   Heart disease Mother    Hypertension Mother    Diabetes Mother    Hypertension Sister    Heart failure Sister    Heart disease Sister    Diabetes Sister    Kidney disease Sister    Kidney disease Brother    Bipolar disorder Son    Schizophrenia Son    Anxiety disorder Son    Hypertension Daughter    Esophageal cancer Other        nephew   Colon cancer Neg Hx    Pancreatic cancer Neg Hx    Stomach cancer Neg Hx    Liver disease Neg Hx    Rectal cancer Neg Hx     SOCIAL HISTORY:   Social History   Socioeconomic History   Marital status: Single    Spouse name: Not on file   Number of children: 2   Years of education: Not on file   Highest education level: Not on file  Occupational History   Occupation: Retired    Fish farm manager: [employer]    Comment: Chiropodist  Tobacco Use   Smoking status: Never   Smokeless tobacco: Never  Vaping Use   Vaping Use: Never used  Substance and Sexual Activity   Alcohol use: No    Alcohol/week: 0.0 standard drinks   Drug use: No   Sexual activity: Not Currently  Other Topics Concern   Not on file  Social History Narrative   Current Social History 08/27/2020        Patient lives alone in a handicap-accessible ground floor apartment which is 1 story. There are not steps up to the entrance the patient uses.       Patient's method of transportation is personal car or family member.      The highest level of education was 2 years of college.      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      Identified important Relationships are My grandchildren, daughter, son       Pets : None       Interests / Fun:  Plants       Current Stressors: Son with special needs (Bi-polar/schizophrenia) , he is not living with her        Religious / Personal Beliefs: [employer] mentors Restaurant manager, fast food)      Therapist, art, RN,BSN          Social Determinants of Health   Financial Resource Strain: Not on file  Food Insecurity: Not on file  Transportation Needs: Not on file  Physical Activity: Not on file  Stress: Not on file  Social Connections: Not on file  Intimate Partner Violence: Not on file    ALLERGIES:    Allergies  Allergen Reactions   Cephalosporins Anaphylaxis, Swelling and Other (See Comments)    Tongue swelling, gum pain   Eicosapentaenoic Acid (Epa) Anaphylaxis and Shortness Of Breath   Fish-Derived Products Anaphylaxis and Shortness Of Breath   Peanuts [Peanut Oil] Anaphylaxis, Shortness Of Breath and Other (See Comments)    Peanut butter   Shrimp [Shellfish Allergy] Anaphylaxis    Pt states her throat will swell if she eats shrimp.   Latex Itching   Metronidazole Other (See Comments)    Palpitations, mild SOB, metallic taste, dry mouth, high blood pressure   Ciprofloxacin Other (See Comments)    Gaging and achy   Lisinopril Cough        Naproxen Nausea Only and Other (See Comments)    Headache   Other Itching and Other (See Comments)     All pain meds make her itch - has to have something to prevent that in addition to receiving med   Prednisone Other (See Comments)    Heart beating fast Ok to take low dosage      CURRENT MEDICATIONS:    Current Outpatient Medications  Medication Sig Dispense Refill   albuterol (PROVENTIL HFA;VENTOLIN HFA) 108 (90 Base) MCG/ACT inhaler Inhale 2 puffs into the lungs every 6 (six) hours as needed for wheezing or shortness of breath. 1 Inhaler 0   amLODipine (NORVASC) 5 MG tablet TAKE 1 TABLET BY MOUTH EVERY DAY 90 tablet 3   aspirin EC 81 MG tablet Take 81 mg by mouth daily.     Cholecalciferol (VITAMIN D HIGH POTENCY) 25 MCG (1000 UT)  capsule Take 1,000 Units by mouth daily.      ciclopirox (  PENLAC) 8 % solution Apply topically at bedtime. Apply over nail and surrounding skin. Apply daily over previous coat. After seven (7) days, may remove with alcohol and continue cycle. 6.6 mL 2   clotrimazole (LOTRIMIN) 1 % cream Apply 1 application topically 2 (two) times daily.     Clotrimazole 1 % OINT Apply twice daily to the affected area on the left leg 56.7 g 0   diclofenac Sodium (VOLTAREN) 1 % GEL Apply 2 g topically 4 (four) times daily. 150 g 2   esomeprazole (NEXIUM) 20 MG capsule Take 20 mg by mouth every other day. Taking daily     fluticasone (FLONASE) 50 MCG/ACT nasal spray PLACE 1 SPRAY INTO BOTH NOSTRILS DAILY. (Patient taking differently: Place 1 spray into both nostrils daily as needed for allergies.) 16 mL 2   losartan (COZAAR) 100 MG tablet TAKE 1 TABLET BY MOUTH EVERY DAY 90 tablet 3   metoprolol tartrate (LOPRESSOR) 25 MG tablet Take 1 tablet (25 mg total) by mouth 2 (two) times daily. 180 tablet 0   Multiple Vitamin (MULTIVITAMIN WITH MINERALS) TABS tablet Take 1 tablet by mouth See admin instructions. Take one tablet by mouth on Monday, Wednesday and Fridays     Polyethyl Glycol-Propyl Glycol (SYSTANE FREE OP) Apply 1 tablet to eye daily as needed (For dry eyes).     rosuvastatin (CRESTOR) 20 MG tablet TAKE 1 TABLET BY MOUTH EVERY DAY 90 tablet 3   predniSONE (DELTASONE) 20 MG tablet Take 3 tablets (60 mg total) by mouth daily with breakfast for 14 days. (Patient not taking: Reported on 07/06/2021) 42 tablet 0   No current facility-administered medications for this visit.    REVIEW OF SYSTEMS:   '[X]'$  denotes positive finding, '[ ]'$  denotes negative finding Cardiac  Comments:  Chest pain or chest pressure:    Shortness of breath upon exertion:    Short of breath when lying flat:    Irregular heart rhythm: x       Vascular    Pain in calf, thigh, or hip brought on by ambulation:    Pain in feet at night that  wakes you up from your sleep:     Blood clot in your veins:    Leg swelling:         Pulmonary    Oxygen at home:    Productive cough:     Wheezing:         Neurologic    Sudden weakness in arms or legs:     Sudden numbness in arms or legs:     Sudden onset of difficulty speaking or slurred speech:    Temporary loss of vision in one eye:     Problems with dizziness:         Gastrointestinal    Blood in stool:      Vomited blood:         Genitourinary    Burning when urinating:     Blood in urine:        Psychiatric    Major depression:         Hematologic    Bleeding problems:    Problems with blood clotting too easily:        Skin    Rashes or ulcers:        Constitutional    Fever or chills:     PHYSICAL EXAM:   Vitals:   07/06/21 1531  BP: 113/74  Pulse: 70  Resp: 20  Temp:  98.2 F (36.8 C)  SpO2: 96%  Weight: 283 lb (128.4 kg)  Height: '5\' 8"'$  (1.727 m)    GENERAL: The patient is a well-nourished female, in no acute distress. The vital signs are documented above. CARDIAC: There is a regular rate and rhythm.  VASCULAR: Palpable pulse over top of her left temporal artery MUSCULOSKELETAL: There are no major deformities or cyanosis. NEUROLOGIC: No focal weakness or paresthesias are detected. SKIN: There are no ulcers or rashes noted. PSYCHIATRIC: The patient has a normal affect.  STUDIES:   None  ASSESSMENT and PLAN   Possible temporal arteritis: I discussed with the patient that our role in this is to provide a biopsy sample so that the diagnosis can be made by pathology.  I discussed that she would likely require some shaving of her hair to get to her temporal artery.  We would only do the left side.  We will try to get this done as soon as possible.  All questions were answered   Leia Alf, MD, FACS [facility] [phone] Pager [phone]

## 2021-07-07 ENCOUNTER — Ambulatory Visit (INDEPENDENT_AMBULATORY_CARE_PROVIDER_SITE_OTHER): Payer: Medicare Other | Admitting: Internal Medicine

## 2021-07-07 VITALS — BP 156/74 | HR 52 | Temp 98.0°F | Wt 283.0 lb

## 2021-07-07 DIAGNOSIS — M316 Other giant cell arteritis: Secondary | ICD-10-CM | POA: Diagnosis not present

## 2021-07-07 NOTE — Progress Notes (Signed)
   CC: giant cell arteritis   HPI:[name] is a 65 y.o. female who presents for evaluation of giant cell arteritis. Please see individual problem based A/P for details.   Past Medical History:  Diagnosis Date   Acute urinary retention s/p Foley 01/07/2012   Allergy    Anemia    Asthma    CAD (coronary artery disease)    a. 12/2013 s/p CABG x 5 Dr. Darcey Nora (LIMA to LAD, SVG to diagonal, SVG to OM1, SVG to OM2, SVG to PDA)   Carotid arterial disease ([place])    a. 12/2013 Carotid U/S: 1-39% bilat ICA stenosis.   Carpal tunnel syndrome, bilateral    GERD (gastroesophageal reflux disease)    Hemorrhoids, internal, with bleeding & prolapse 12/13/2011   Hernia, abdominal    History of blood transfusion    CABG and hysterectomy   History of kidney stones    Hyperlipidemia    Hypertension    Iron deficiency 12-07-2011   Keloid scar    a. Sternal Keloid s/p CABG with ongoing pain.   Morbid obesity ([place])    Myocardial infarct ([place])    2015   Neuropathy    Osteoarthritis    Osteoporosis    PONV (postoperative nausea and vomiting)    Seasonal allergies    Sleep apnea    no CPAP machine; sleep study 02/2010 REM AHI 61.7/hr, total sleep REM 14.8/hr   Uterine cancer ([place]) 7/15   clinical stage IA grade 1 endometrioid endometrial cancer   Review of Systems:   Review of Systems  Constitutional:  Negative for chills and fever.  Neurological:  Negative for dizziness, weakness and headaches.    Physical Exam: Vitals:   07/07/21 1044  BP: (!) 156/74  Pulse: (!) 52  Temp: 98 F (36.7 C)  TempSrc: Oral  SpO2: 100%  Weight: 283 lb (128.4 kg)   General: well kept, NAD,  HEENT: Conjunctiva nl , antiicteric sclerae, moist mucous membranes, no tenderness on palpation of both temple Cardiovascular: bradycardic, regular rhythm.  No murmurs, rubs, or gallops Pulmonary : Equal breath sounds, No wheezes, rales, or rhonchi Abdominal: soft, nontender,  bowel sounds present Ext: No edema  in lower extremities, no tenderness to palpation of lower extremities.   Assessment & Plan:   See Encounters Tab for problem based charting.  Patient discussed with Dr. Evette Doffing

## 2021-07-07 NOTE — Patient Instructions (Signed)
Thank you for trusting me with your care. To recap, today we discussed the following:   Giant Cell Arteritis - Continue Prednisone, recommend at least taking one tablet daily. I will call you with the result of your biopsy.

## 2021-07-07 NOTE — Progress Notes (Signed)
Surgical Instructions    Your procedure is scheduled on 07/10/21.  Report to [facility] Main Entrance "A" at 08:30 A.M., then check in with the Admitting office.  Call this number if you have problems the morning of surgery:  [phone]   If you have any questions prior to your surgery date call [phone]: Open Monday-Friday 8am-4pm    Remember:  Do not eat or drink after midnight the night before your surgery      Take these medicines the morning of surgery with A SIP OF WATER  acetaminophen (TYLENOL) if needed albuterol (PROVENTIL HFA;VENTOLIN HFA) inhaler if needed (bring with you the day of surgery) esomeprazole (NEXIUM) if needed metoprolol tartrate (LOPRESSOR) predniSONE (DELTASONE)   As of today, STOP taking any Aspirin (unless otherwise instructed by your surgeon) Aleve, Naproxen, Ibuprofen, Motrin, Advil, Goody's, BC's, all herbal medications, fish oil, diclofenac Sodium (VOLTAREN) gel and all vitamins.          Do not wear jewelry or makeup Do not wear lotions, powders, perfumes/colognes, or deodorant. Do not shave 48 hours prior to surgery.   Do not bring valuables to the hospital.  DO Not wear nail polish, gel polish, artificial nails, or any other type of covering on natural nails  including finger and toenails. If patients have artificial nails, gel coating, etc. that need to be removed by a nail salon please have this removed prior to surgery or surgery may need to be canceled/delayed if the surgeon/ anesthesia feels like the patient is unable to be adequately monitored.             [facility] is not responsible for any belongings or valuables.  Do NOT Smoke (Tobacco/Vaping) or drink Alcohol 24 hours prior to your procedure If you use a CPAP at night, you may bring all equipment for your overnight stay.   Contacts, glasses, dentures or bridgework may not be worn into surgery, please bring cases for these belongings   For patients admitted to the hospital,  discharge time will be determined by your treatment team.   Patients discharged the day of surgery will not be allowed to drive home, and someone needs to stay with them for 24 hours.  ONLY 1 SUPPORT PERSON MAY BE PRESENT WHILE YOU ARE IN SURGERY. IF YOU ARE TO BE ADMITTED ONCE YOU ARE IN YOUR ROOM YOU WILL BE ALLOWED TWO (2) VISITORS.  Minor children may have two parents present. Special consideration for safety and communication needs will be reviewed on a case by case basis.  Special instructions:    Oral Hygiene is also important to reduce your risk of infection.  Remember - BRUSH YOUR TEETH THE MORNING OF SURGERY WITH YOUR REGULAR TOOTHPASTE   [facility]- Preparing For Surgery  Before surgery, you can play an important role. Because skin is not sterile, your skin needs to be as free of germs as possible. You can reduce the number of germs on your skin by washing with CHG (chlorahexidine gluconate) Soap before surgery.  CHG is an antiseptic cleaner which kills germs and bonds with the skin to continue killing germs even after washing.     Please do not use if you have an allergy to CHG or antibacterial soaps. If your skin becomes reddened/irritated stop using the CHG.  Do not shave (including legs and underarms) for at least 48 hours prior to first CHG shower. It is OK to shave your face.  Please follow these instructions carefully.  Shower the NIGHT BEFORE SURGERY and the MORNING OF SURGERY with CHG Soap.   If you chose to wash your hair, wash your hair first as usual with your normal shampoo. After you shampoo, rinse your hair and body thoroughly to remove the shampoo.  Then [employer] and genitals (private parts) with your normal soap and rinse thoroughly to remove soap.  After that Use CHG Soap as you would any other liquid soap. You can apply CHG directly to the skin and wash gently with a scrungie or a clean washcloth.   Apply the CHG Soap to your body ONLY FROM THE NECK DOWN.   Do not use on open wounds or open sores. Avoid contact with your eyes, ears, mouth and genitals (private parts). Wash Face and genitals (private parts)  with your normal soap.   Wash thoroughly, paying special attention to the area where your surgery will be performed.  Thoroughly rinse your body with warm water from the neck down.  DO NOT shower/wash with your normal soap after using and rinsing off the CHG Soap.  Pat yourself dry with a CLEAN TOWEL.  Wear CLEAN PAJAMAS to bed the night before surgery  Place CLEAN SHEETS on your bed the night before your surgery  DO NOT SLEEP WITH PETS.   Day of Surgery: Take a shower with CHG soap. Wear Clean/Comfortable clothing the morning of surgery Do not apply any deodorants/lotions.   Remember to brush your teeth WITH YOUR REGULAR TOOTHPASTE.   Please read over the following fact sheets that you were given.

## 2021-07-08 ENCOUNTER — Other Ambulatory Visit: Payer: Self-pay

## 2021-07-08 ENCOUNTER — Encounter (HOSPITAL_COMMUNITY)
Admission: RE | Admit: 2021-07-08 | Discharge: 2021-07-08 | Disposition: A | Discharge status: Home / Self Care | Payer: Medicare Other | Source: Ambulatory Visit | Attending: Surgery | Admitting: Surgery

## 2021-07-08 ENCOUNTER — Encounter: Payer: Self-pay | Admitting: Internal Medicine

## 2021-07-08 ENCOUNTER — Encounter (HOSPITAL_COMMUNITY): Payer: Self-pay

## 2021-07-08 DIAGNOSIS — Z01812 Encounter for preprocedural laboratory examination: Secondary | ICD-10-CM | POA: Insufficient documentation

## 2021-07-08 LAB — CBC
HCT: 39.4 % (ref 36.0–46.0)
Hemoglobin: 13.2 g/dL (ref 12.0–15.0)
MCH: 28.6 pg (ref 26.0–34.0)
MCHC: 33.5 g/dL (ref 30.0–36.0)
MCV: 85.5 fL (ref 80.0–100.0)
Platelets: 214 10*3/uL (ref 150–400)
RBC: 4.61 MIL/uL (ref 3.87–5.11)
RDW: 14.6 % (ref 11.5–15.5)
WBC: 8 10*3/uL (ref 4.0–10.5)
nRBC: 0 % (ref 0.0–0.2)

## 2021-07-08 LAB — BASIC METABOLIC PANEL
Anion gap: 7 (ref 5–15)
BUN: 9 mg/dL (ref 8–23)
CO2: 29 mmol/L (ref 22–32)
Calcium: 9.1 mg/dL (ref 8.9–10.3)
Chloride: 102 mmol/L (ref 98–111)
Creatinine, Ser: 0.77 mg/dL (ref 0.44–1.00)
GFR, Estimated: >60 mL/min (ref >60)
Glucose, Bld: 104 mg/dL — ABNORMAL HIGH (ref 70–99)
Potassium: 3.6 mmol/L (ref 3.5–5.1)
Sodium: 138 mmol/L (ref 135–145)

## 2021-07-08 LAB — SURGICAL PCR SCREEN
MRSA, PCR: NEGATIVE
Staphylococcus aureus: NEGATIVE

## 2021-07-08 NOTE — Progress Notes (Addendum)
Your procedure is scheduled on Friday 07/10/21.  Report to [facility] Main Entrance "A" at 08:30 A.M., and check in at the Admitting office.  Call this number if you have problems the morning of surgery: [phone]  Call [phone] if you have any questions prior to your surgery date Monday-Friday 8am-4pm   Remember: Do not eat or drink after midnight the night before your surgery   Take these medicines the morning of surgery with A SIP OF WATER: aspirin EC metoprolol tartrate (LOPRESSOR) predniSONE (DELTASONE)   If needed: acetaminophen (TYLENOL) albuterol (PROVENTIL HFA;VENTOLIN HFA) 108 (90 Base) --- Please bring all inhalers with you the day of surgery.  esomeprazole (NEXIUM)   As of today, STOP taking any diclofenac Sodium (VOLTAREN), Aleve, Naproxen, Ibuprofen, Motrin, Advil, Goody's, BC's, all herbal medications, fish oil, and all vitamins.    The Morning of Surgery  Do not wear jewelry, make-up or nail polish.  Do not wear lotions, powders, or perfumes, or deodorant  Do not shave 48 hours prior to surgery.    Do not bring valuables to the hospital.  [facility] is not responsible for any belongings or valuables.  If you are a smoker, DO NOT Smoke 24 hours prior to surgery  If you wear a CPAP at night please bring your mask the morning of surgery   Remember that you must have someone to transport you home after your surgery, and remain with you for 24 hours if you are discharged the same day.   Please bring cases for contacts, glasses, hearing aids, dentures or bridgework because it cannot be worn into surgery.    Leave your suitcase in the car.  After surgery it may be brought to your room.  For patients admitted to the hospital, discharge time will be determined by your treatment team.  Patients discharged the day of surgery will not be allowed to drive home.    Special instructions:   [facility]- Preparing For Surgery  Before surgery, you can play  an important role. Because skin is not sterile, your skin needs to be as free of germs as possible. You can reduce the number of germs on your skin by washing with CHG (chlorahexidine gluconate) Soap before surgery.  CHG is an antiseptic cleaner which kills germs and bonds with the skin to continue killing germs even after washing.    Oral Hygiene is also important to reduce your risk of infection.  Remember - BRUSH YOUR TEETH THE MORNING OF SURGERY WITH YOUR REGULAR TOOTHPASTE  Please do not use if you have an allergy to CHG or antibacterial soaps. If your skin becomes reddened/irritated stop using the CHG.  Do not shave (including legs and underarms) for at least 48 hours prior to first CHG shower. It is OK to shave your face.  Please follow these instructions carefully.   Shower the NIGHT BEFORE SURGERY and the MORNING OF SURGERY with CHG Soap.   If you chose to wash your hair and body, wash as usual with your normal shampoo and body-wash/soap.  Rinse your hair and body thoroughly to remove the shampoo and soap.  Apply CHG directly to the skin (ONLY FROM THE NECK DOWN) and wash gently with a scrungie or a clean washcloth.   Do not use on open wounds or open sores. Avoid contact with your eyes, ears, mouth and genitals (private parts). Wash Face and genitals (private parts)  with your normal soap.   Wash thoroughly, paying special attention to the  area where your surgery will be performed.  Thoroughly rinse your body with warm water from the neck down.  DO NOT shower/wash with your normal soap after using and rinsing off the CHG Soap.  Pat yourself dry with a CLEAN TOWEL.  Wear CLEAN PAJAMAS to bed the night before surgery  Place CLEAN SHEETS on your bed the night of your first shower and DO NOT SLEEP WITH PETS.  Wear comfortable clothes the morning of surgery.     Day of Surgery:  Please shower the morning of surgery with the CHG soap Do not apply any  deodorants/lotions. Please wear clean clothes to the hospital/surgery center.   Remember to brush your teeth WITH YOUR REGULAR TOOTHPASTE.   Please read over the following fact sheets that you were given.

## 2021-07-08 NOTE — Progress Notes (Signed)
PCP - Dr. Lalla Brothers Cardiologist - Dr. Debara Pickett Chest x-ray - 01/17/21 EKG - 04/30/21 Stress Test - 01/30/17 ECHO -  Cardiac Cath - 12/25/13  Sleep Study - yes CPAP - no  Blood Thinner Instructions:  Aspirin Instructions: please continue ASA through DOS per [facility]  COVID TEST- covid + 6/9 (ambulatory surgery)    Anesthesia review: yes - cardiac hx  Patient denies shortness of breath, fever, cough and chest pain at PAT appointment   All instructions explained to the patient, with a verbal understanding of the material. Patient agrees to go over the instructions while at home for a better understanding. Patient also instructed to self quarantine after being tested for COVID-19. The opportunity to ask questions was provided.

## 2021-07-08 NOTE — Assessment & Plan Note (Addendum)
Patient presents for evaluation after starting prednisone last week for possible GCA while awaiting biopsy. Patient reports her headache is not bothering her today, but it comes and goes. She is not taking full dose of prednisone prescribed ('60mg'$  daily) because she feels gitters. She has been taking 10 mg, we discussed and patient agreeable to trying 20 mg daily. She is on schedule with vascular surgery for temporal biopsy on Friday.

## 2021-07-09 NOTE — Progress Notes (Signed)
Internal Medicine Clinic Attending  I saw and evaluated the patient.  I personally confirmed the key portions of the history and exam documented by Dr. Steen and I reviewed pertinent patient test results.  The assessment, diagnosis, and plan were formulated together and I agree with the documentation in the resident's note.     

## 2021-07-09 NOTE — Progress Notes (Signed)
Anesthesia Chart Review:  Case: [id number] Date/Time: 07/10/21 1014   Procedure: LEFT TEMPORAL ARTERY BIOPSY (Left)   Anesthesia type: Choice   Pre-op diagnosis: GIANT CELL ARTERITIS   Location: [facility] OR ROOM 72 / [place] OR   Surgeons: Serafina Mitchell, MD       DISCUSSION: Patient is a 65 year old female scheduled for the above procedure.  History includes never smoker, post-operative N/V, CAD (s/p CABG x5: LIMA-LAD, SVG-DIAG, SVG-OM1-OM2, SVG-PDA 01/09/14; Complex sternal keloid scar revision 05/07/16), HTN, dyslipidemia, palpitations, anemia (with DUB s/p hysterectomy 06/03/14: stage 1A endometrioid carcinoma), bilateral breast reduction with free nipple grafts (10/10/15), GERD, OSA (no CPAP), asthma, neuropathy, carotid artery disease (1-39% 12/2013), + COVID-19 04/30/21. BMI is consistent with morbid obesity.  Last cardiology visit with Dr. Debara Pickett 04/02/21. She was doing well other than having some back and hip issues. She had been losing some weight following a plant based diet. HTN controlled. No exertional chest pain. One year follow-up planned.   She had follow-up with Madalyn Rob, MD on 07/08/21 for suspected giant cell arteritis, started on prednisone last week while awaiting 07/10/21 biopsy. She is to continue ASA per [facility]VS.   Anesthesia team to evaluate on the day of surgery.   VS: BP (!) 158/76   Pulse 60   Temp 37.3 C (Oral)   Resp 20   Ht '5\' 8"'$  (1.727 m)   Wt 127.7 kg   LMP 01/13/2014 Comment: spotting in Feb and bleeding since April  SpO2 96%   BMI 42.80 kg/m   PROVIDERS: Axel Filler, MD is PCP Lyman Bishop, MD is cardiologist   LABS: Labs reviewed: Acceptable for surgery. (all labs ordered are listed, but only abnormal results are displayed)  Labs Reviewed  BASIC METABOLIC PANEL - Abnormal; Notable for the following components:      Result Value   Glucose, Bld 104 (*)    All other components within normal limits  SURGICAL PCR SCREEN  CBC     IMAGES: CXR  01/17/21: FINDINGS: Lungs are clear. No pneumothorax or pleural effusion. Coronary artery bypass grafting has been performed. Cardiac size within normal limits. Pulmonary vascularity is normal. Degenerative changes are seen within the thoracic spine. No acute bone abnormality. IMPRESSION: No active cardiopulmonary disease.   EKG: 04/30/21: Normal sinus rhythm Anterior infarct , age undetermined Nonspecific T wave abnormality Abnormal ECG No significant change since last tracing Confirmed by Glori Bickers [id number]) on 05/01/2021 2:09:24 PM   [facility]: Myocardial Perfusion Imaging 01/20/17: The left ventricular ejection fraction is mildly decreased (45-54%). Nuclear stress EF: 49%. There was no ST segment deviation noted during stress. This is a low risk study. Low risk stress nuclear study with no ischemia or infarction; EF 49 but visually appears better; normal wall motion; mild LVE.   Echo 01/05/14:   Study Conclusions: - Left ventricle: Limited apical views. No gross regional wall motion abnormalities. Papillary muscle calcification noted. The cavity size was normal. Wall thickness was normal. Systolic function was normal. The estimated ejection fraction was in the range of 60% to 65%. Left ventricular diastolic function parameters were normal. - Aortic valve: Mildly calcified annulus. Mildly thickened leaflets. Transvalvular velocity was within the normal range. There was no stenosis. - Mitral valve: Calcified annulus. Trivial regurgitation. - Left atrium: The atrium was mildly dilated.    Her last cardiac cath were done in 12/2013 prior to her CABG.  Reports are in Epic.    US Carotid 01/05/14:  Summary:  Bilateral: intimal  wall thickening CCA. Mild mixed plaque origin and proximal ICA and ECA. 1-39% ICA stenosis. Vertebral artery flow is antegrade.   Past Medical History:  Diagnosis Date   Acute urinary retention s/p Foley 01/07/2012   Allergy    Anemia    Asthma    CAD  (coronary artery disease)    a. 12/2013 s/p CABG x 5 Dr. Darcey Nora (LIMA to LAD, SVG to diagonal, SVG to OM1, SVG to OM2, SVG to PDA)   Carotid arterial disease ([place])    a. 12/2013 Carotid U/S: 1-39% bilat ICA stenosis.   Carpal tunnel syndrome, bilateral    GERD (gastroesophageal reflux disease)    Hemorrhoids, internal, with bleeding & prolapse 12/13/2011   Hernia, abdominal    History of blood transfusion    CABG and hysterectomy   History of kidney stones    Hyperlipidemia    Hypertension    Iron deficiency 12-07-2011   Keloid scar    a. Sternal Keloid s/p CABG with ongoing pain.   Morbid obesity ([place])    Myocardial infarct ([place])    2015   Neuropathy    Osteoarthritis    Osteoporosis    PONV (postoperative nausea and vomiting)    Seasonal allergies    Sleep apnea    no CPAP machine; sleep study 02/2010 REM AHI 61.7/hr, total sleep REM 14.8/hr   Uterine cancer ([place]) 7/15   clinical stage IA grade 1 endometrioid endometrial cancer    Past Surgical History:  Procedure Laterality Date   BREAST REDUCTION SURGERY Bilateral 10/10/2015   Procedure: BILATERAL MAMMARY REDUCTION  (BREAST) WITH FREE NIPPLE GRAFT;  Surgeon: Irene Limbo, MD;  Location: [place];  Service: Plastics;  Laterality: Bilateral;   CARDIAC CATHETERIZATION     CATARACT EXTRACTION Right    COLONOSCOPY  2009   COLONOSCOPY W/ BIOPSIES AND POLYPECTOMY     CORONARY ARTERY BYPASS GRAFT N/A 01/09/2014   Procedure: CORONARY ARTERY BYPASS GRAFTING (CABG) x 5 using left internal mammary artery and right leg greater saphenous vein harvested endoscopically;  Surgeon: Ivin Poot, MD;  Location: [place];  Service: Open Heart Surgery;  Laterality: N/A;  please use bed extenders and breast binder   INTRAOPERATIVE TRANSESOPHAGEAL ECHOCARDIOGRAM N/A 01/09/2014   Procedure: INTRAOPERATIVE TRANSESOPHAGEAL ECHOCARDIOGRAM;  Surgeon: Ivin Poot, MD;  Location: [place];  Service: Open Heart Surgery;  Laterality: N/A;   KNEE  ARTHROSCOPY  1991   KNEE SURGERY Bilateral 1999   LEFT HEART CATHETERIZATION WITH CORONARY ANGIOGRAM N/A 01/04/2014   Procedure: LEFT HEART CATHETERIZATION WITH CORONARY ANGIOGRAM;  Surgeon: Sinclair Grooms, MD;  Location: [facility] CATH LAB;  Service: Cardiovascular;  Laterality: N/A;   LESION EXCISION WITH COMPLEX REPAIR N/A 05/07/2016   Procedure: COMPLEX REPAIR OF CHEST 16 CM;  Surgeon: Irene Limbo, MD;  Location: [place];  Service: Plastics;  Laterality: N/A;   MULTIPLE EXTRACTIONS WITH ALVEOLOPLASTY N/A 04/11/2014   Procedure: Extraction of tooth #'s 1,2,8,16 with alveoloplasty, maxillary tuberosity reductions, and gross debridement of remaining teeth.;  Surgeon: Lenn Cal, DDS;  Location: [place];  Service: Oral Surgery;  Laterality: N/A;   NM MYOCAR PERF WALL MOTION  01/2010   dipyridamole myoview - moderate perfusion defect in basal inferoseptal, basal inferior, mid inferoseptal, mid inferior, apical inferior region; EF 56%   TEE WITHOUT CARDIOVERSION  01/2010   EF 60-65%, small, flat, non-infiltrating, calcified, fixed apical/septal mass   TOTAL KNEE ARTHROPLASTY Left 04/29/2011   transanal hemorrhoidal dearterliaization  01/06/12   with  external hemorrhoid removal   UNILATERAL SALPINGECTOMY Right 06/03/2014   Procedure: UNILATERAL SALPINGECTOMY;  Surgeon: Lavonia Drafts, MD;  Location: [place] [place] [facility];  Service: Gynecology;  Laterality: Right;   VAGINAL HYSTERECTOMY N/A 06/03/2014   Procedure: HYSTERECTOMY VAGINAL;  Surgeon: Lavonia Drafts, MD;  Location: [facility] [place] [facility];  Service: Gynecology;  Laterality: N/A;   WISDOM TOOTH EXTRACTION      MEDICATIONS:  acetaminophen (TYLENOL) 500 MG tablet   albuterol (PROVENTIL HFA;VENTOLIN HFA) 108 (90 Base) MCG/ACT inhaler   amLODipine (NORVASC) 5 MG tablet   aspirin EC 81 MG tablet   Cholecalciferol (VITAMIN D HIGH POTENCY) 25 MCG (1000 UT) capsule   ciclopirox (PENLAC) 8 % solution   Clotrimazole 1 % OINT   diclofenac Sodium (VOLTAREN) 1 %  GEL   esomeprazole (NEXIUM) 20 MG capsule   fluticasone (FLONASE) 50 MCG/ACT nasal spray   losartan (COZAAR) 100 MG tablet   metoprolol tartrate (LOPRESSOR) 25 MG tablet   Multiple Vitamin (MULTIVITAMIN WITH MINERALS) TABS tablet   nystatin cream (MYCOSTATIN)   Polyethyl Glycol-Propyl Glycol (SYSTANE FREE OP)   predniSONE (DELTASONE) 20 MG tablet   rosuvastatin (CRESTOR) 20 MG tablet   No current facility-administered medications for this encounter.    Myra Gianotti, PA-C Surgical Short Stay/Anesthesiology [facility] Phone [phone] [facility] Phone [phone] 07/09/2021 11:08 AM

## 2021-07-09 NOTE — Anesthesia Preprocedure Evaluation (Addendum)
Anesthesia Evaluation  Patient identified by MRN, date of birth, ID band Patient awake    Reviewed: Allergy & Precautions, H&P , NPO status , Patient's Chart, lab work & pertinent test results, reviewed documented beta blocker date and time   History of Anesthesia Complications (+) PONV  Airway Mallampati: III  TM Distance: >3 FB Neck ROM: Full    Dental no notable dental hx. (+) Teeth Intact, Dental Advisory Given   Pulmonary asthma , sleep apnea ,    Pulmonary exam normal breath sounds clear to auscultation       Cardiovascular hypertension, Pt. on medications and Pt. on home beta blockers + CAD, + Past MI and + CABG   Rhythm:Regular Rate:Normal     Neuro/Psych Depression negative neurological ROS     GI/Hepatic Neg liver ROS, GERD  Medicated,  Endo/Other  Morbid obesity  Renal/GU negative Renal ROS  negative genitourinary   Musculoskeletal  (+) Arthritis , Osteoarthritis,    Abdominal   Peds  Hematology  (+) Blood dyscrasia, anemia ,   Anesthesia Other Findings   Reproductive/Obstetrics negative OB ROS                           Anesthesia Physical Anesthesia Plan  ASA: 3  Anesthesia Plan: General   Post-op Pain Management:    Induction: Intravenous  PONV Risk Score and Plan: 4 or greater and Ondansetron, Dexamethasone and Midazolam  Airway Management Planned: LMA  Additional Equipment:   Intra-op Plan:   Post-operative Plan: Extubation in OR  Informed Consent: I have reviewed the patients History and Physical, chart, labs and discussed the procedure including the risks, benefits and alternatives for the proposed anesthesia with the patient or authorized representative who has indicated his/her understanding and acceptance.     Dental advisory given  Plan Discussed with: CRNA  Anesthesia Plan Comments: (PAT note written 07/09/2021 by Myra Gianotti, PA-C. )        Anesthesia Quick Evaluation

## 2021-07-10 ENCOUNTER — Ambulatory Visit (HOSPITAL_COMMUNITY): Payer: Medicare Other | Admitting: Anesthesiology

## 2021-07-10 ENCOUNTER — Encounter (HOSPITAL_COMMUNITY)
Admission: RE | Disposition: A | Discharge status: Home / Self Care | Payer: Self-pay | Source: Home / Self Care | Attending: Surgery

## 2021-07-10 ENCOUNTER — Other Ambulatory Visit: Payer: Self-pay

## 2021-07-10 ENCOUNTER — Encounter (HOSPITAL_COMMUNITY): Payer: Self-pay | Admitting: Surgery

## 2021-07-10 ENCOUNTER — Ambulatory Visit (HOSPITAL_COMMUNITY): Payer: Medicare Other | Admitting: Vascular Surgery

## 2021-07-10 ENCOUNTER — Ambulatory Visit (HOSPITAL_COMMUNITY)
Admission: RE | Admit: 2021-07-10 | Discharge: 2021-07-10 | Disposition: A | Discharge status: Home / Self Care | Payer: Medicare Other | Attending: Surgery | Admitting: Surgery

## 2021-07-10 DIAGNOSIS — Z881 Allergy status to other antibiotic agents status: Secondary | ICD-10-CM | POA: Insufficient documentation

## 2021-07-10 DIAGNOSIS — Z91040 Latex allergy status: Secondary | ICD-10-CM | POA: Insufficient documentation

## 2021-07-10 DIAGNOSIS — Z888 Allergy status to other drugs, medicaments and biological substances status: Secondary | ICD-10-CM | POA: Diagnosis not present

## 2021-07-10 DIAGNOSIS — Z91013 Allergy to seafood: Secondary | ICD-10-CM | POA: Diagnosis not present

## 2021-07-10 DIAGNOSIS — R519 Headache, unspecified: Secondary | ICD-10-CM | POA: Diagnosis not present

## 2021-07-10 DIAGNOSIS — Z8542 Personal history of malignant neoplasm of other parts of uterus: Secondary | ICD-10-CM | POA: Diagnosis not present

## 2021-07-10 DIAGNOSIS — H538 Other visual disturbances: Secondary | ICD-10-CM | POA: Diagnosis not present

## 2021-07-10 DIAGNOSIS — Z951 Presence of aortocoronary bypass graft: Secondary | ICD-10-CM | POA: Insufficient documentation

## 2021-07-10 DIAGNOSIS — I672 Cerebral atherosclerosis: Secondary | ICD-10-CM | POA: Diagnosis not present

## 2021-07-10 DIAGNOSIS — I10 Essential (primary) hypertension: Secondary | ICD-10-CM | POA: Insufficient documentation

## 2021-07-10 DIAGNOSIS — Z79899 Other long term (current) drug therapy: Secondary | ICD-10-CM | POA: Insufficient documentation

## 2021-07-10 DIAGNOSIS — M316 Other giant cell arteritis: Secondary | ICD-10-CM | POA: Diagnosis not present

## 2021-07-10 DIAGNOSIS — Z886 Allergy status to analgesic agent status: Secondary | ICD-10-CM | POA: Diagnosis not present

## 2021-07-10 DIAGNOSIS — Z91010 Allergy to peanuts: Secondary | ICD-10-CM | POA: Diagnosis not present

## 2021-07-10 DIAGNOSIS — E7800 Pure hypercholesterolemia, unspecified: Secondary | ICD-10-CM | POA: Diagnosis not present

## 2021-07-10 DIAGNOSIS — E785 Hyperlipidemia, unspecified: Secondary | ICD-10-CM | POA: Diagnosis not present

## 2021-07-10 DIAGNOSIS — I2510 Atherosclerotic heart disease of native coronary artery without angina pectoris: Secondary | ICD-10-CM | POA: Diagnosis not present

## 2021-07-10 DIAGNOSIS — Z7982 Long term (current) use of aspirin: Secondary | ICD-10-CM | POA: Insufficient documentation

## 2021-07-10 DIAGNOSIS — I708 Atherosclerosis of other arteries: Secondary | ICD-10-CM | POA: Diagnosis not present

## 2021-07-10 HISTORY — PX: ARTERY BIOPSY: SHX891

## 2021-07-10 LAB — POCT I-STAT, CHEM 8
BUN: 11 mg/dL (ref 8–23)
Calcium, Ion: 0.97 mmol/L — ABNORMAL LOW (ref 1.15–1.40)
Chloride: 106 mmol/L (ref 98–111)
Creatinine, Ser: 0.7 mg/dL (ref 0.44–1.00)
Glucose, Bld: 112 mg/dL — ABNORMAL HIGH (ref 70–99)
HCT: 40 % (ref 36.0–46.0)
Hemoglobin: 13.6 g/dL (ref 12.0–15.0)
Potassium: 4.1 mmol/L (ref 3.5–5.1)
Sodium: 138 mmol/L (ref 135–145)
TCO2: 27 mmol/L (ref 22–32)

## 2021-07-10 SURGERY — BIOPSY TEMPORAL ARTERY
Anesthesia: General | Laterality: Left

## 2021-07-10 MED ORDER — DEXAMETHASONE SODIUM PHOSPHATE 10 MG/ML IJ SOLN
INTRAMUSCULAR | Status: AC
  Filled 2021-07-10: qty 1

## 2021-07-10 MED ORDER — ORAL CARE MOUTH RINSE: 15.0000 mL | Freq: Once | OROMUCOSAL | Status: AC

## 2021-07-10 MED ORDER — VANCOMYCIN HCL 1500 MG/300ML IV SOLN
1500.0000 mg | INTRAVENOUS | Status: AC
  Administered 2021-07-10: 1500 mg via INTRAVENOUS
  Filled 2021-07-10: qty 300

## 2021-07-10 MED ORDER — PROPOFOL 10 MG/ML IV BOLUS
INTRAVENOUS | Status: AC
  Filled 2021-07-10: qty 20

## 2021-07-10 MED ORDER — LIDOCAINE 2% (20 MG/ML) 5 ML SYRINGE
INTRAMUSCULAR | Status: AC
  Filled 2021-07-10: qty 5

## 2021-07-10 MED ORDER — LACTATED RINGERS IV SOLN: INTRAVENOUS | Status: DC

## 2021-07-10 MED ORDER — ONDANSETRON HCL 4 MG/2ML IJ SOLN
INTRAMUSCULAR | Status: AC
  Filled 2021-07-10: qty 2

## 2021-07-10 MED ORDER — EPHEDRINE SULFATE-NACL 50-0.9 MG/10ML-% IV SOSY
PREFILLED_SYRINGE | INTRAVENOUS | Status: DC | PRN
  Administered 2021-07-10 (×2): 10 mg via INTRAVENOUS
  Administered 2021-07-10: 15 mg via INTRAVENOUS

## 2021-07-10 MED ORDER — LIDOCAINE 2% (20 MG/ML) 5 ML SYRINGE
INTRAMUSCULAR | Status: DC | PRN
  Administered 2021-07-10: 60 mg via INTRAVENOUS

## 2021-07-10 MED ORDER — FENTANYL CITRATE (PF) 250 MCG/5ML IJ SOLN
INTRAMUSCULAR | Status: DC | PRN
  Administered 2021-07-10 (×3): 50 ug via INTRAVENOUS

## 2021-07-10 MED ORDER — CHLORHEXIDINE GLUCONATE 4 % EX LIQD: 60.0000 mL | Freq: Once | CUTANEOUS | Status: DC

## 2021-07-10 MED ORDER — EPHEDRINE 5 MG/ML INJ
INTRAVENOUS | Status: AC
  Filled 2021-07-10: qty 5

## 2021-07-10 MED ORDER — PHENYLEPHRINE HCL-NACL 20-0.9 MG/250ML-% IV SOLN
INTRAVENOUS | Status: DC | PRN
Start: 2021-07-10 — End: 2021-07-10
  Administered 2021-07-10: 50 ug/min via INTRAVENOUS

## 2021-07-10 MED ORDER — FENTANYL CITRATE (PF) 100 MCG/2ML IJ SOLN
25.0000 ug | INTRAMUSCULAR | Status: DC | PRN
  Administered 2021-07-10: 25 ug via INTRAVENOUS
  Administered 2021-07-10: 50 ug via INTRAVENOUS
  Administered 2021-07-10: 25 ug via INTRAVENOUS

## 2021-07-10 MED ORDER — LIDOCAINE HCL (PF) 1 % IJ SOLN: INTRAMUSCULAR | Status: DC | PRN

## 2021-07-10 MED ORDER — MIDAZOLAM HCL 2 MG/2ML IJ SOLN
INTRAMUSCULAR | Status: AC
  Filled 2021-07-10: qty 2

## 2021-07-10 MED ORDER — FENTANYL CITRATE (PF) 250 MCG/5ML IJ SOLN
INTRAMUSCULAR | Status: AC
  Filled 2021-07-10: qty 5

## 2021-07-10 MED ORDER — OXYCODONE-ACETAMINOPHEN 5-325 MG PO TABS: 1.0000 | ORAL_TABLET | Freq: Four times a day (QID) | ORAL | 0 refills | Status: DC | PRN

## 2021-07-10 MED ORDER — 0.9 % SODIUM CHLORIDE (POUR BTL) OPTIME
TOPICAL | Status: DC | PRN
  Administered 2021-07-10: 1000 mL

## 2021-07-10 MED ORDER — MIDAZOLAM HCL 5 MG/5ML IJ SOLN
INTRAMUSCULAR | Status: DC | PRN
Start: 2021-07-10 — End: 2021-07-10
  Administered 2021-07-10: 2 mg via INTRAVENOUS

## 2021-07-10 MED ORDER — PHENYLEPHRINE HCL (PRESSORS) 10 MG/ML IV SOLN
INTRAVENOUS | Status: AC
  Filled 2021-07-10: qty 2

## 2021-07-10 MED ORDER — FENTANYL CITRATE (PF) 100 MCG/2ML IJ SOLN
INTRAMUSCULAR | Status: AC
  Filled 2021-07-10: qty 2

## 2021-07-10 MED ORDER — VANCOMYCIN HCL IN DEXTROSE 1-5 GM/200ML-% IV SOLN
INTRAVENOUS | Status: AC
  Filled 2021-07-10: qty 200

## 2021-07-10 MED ORDER — PROPOFOL 10 MG/ML IV BOLUS
INTRAVENOUS | Status: DC | PRN
  Administered 2021-07-10: 50 mg via INTRAVENOUS
  Administered 2021-07-10: 150 mg via INTRAVENOUS

## 2021-07-10 MED ORDER — CHLORHEXIDINE GLUCONATE 0.12 % MT SOLN
OROMUCOSAL | Status: AC
  Administered 2021-07-10: 15 mL via OROMUCOSAL
  Filled 2021-07-10: qty 15

## 2021-07-10 MED ORDER — CHLORHEXIDINE GLUCONATE 0.12 % MT SOLN: 15.0000 mL | Freq: Once | OROMUCOSAL | Status: AC

## 2021-07-10 MED ORDER — ONDANSETRON HCL 4 MG/2ML IJ SOLN
INTRAMUSCULAR | Status: DC | PRN
  Administered 2021-07-10: 4 mg via INTRAVENOUS

## 2021-07-10 MED ORDER — ALBUTEROL SULFATE HFA 108 (90 BASE) MCG/ACT IN AERS
INHALATION_SPRAY | RESPIRATORY_TRACT | Status: DC | PRN
  Administered 2021-07-10 (×4): 2 via RESPIRATORY_TRACT

## 2021-07-10 MED ORDER — SODIUM CHLORIDE 0.9 % IV SOLN: INTRAVENOUS | Status: DC

## 2021-07-10 MED ORDER — DEXAMETHASONE SODIUM PHOSPHATE 10 MG/ML IJ SOLN
INTRAMUSCULAR | Status: DC | PRN
  Administered 2021-07-10: 10 mg via INTRAVENOUS

## 2021-07-10 SURGICAL SUPPLY — 40 items
BAG COUNTER SPONGE SURGICOUNT (BAG) ×2 IMPLANT
CANISTER SUCT 3000ML PPV (MISCELLANEOUS) ×2 IMPLANT
CLIP VESOCCLUDE SM WIDE 6/CT (CLIP) ×2 IMPLANT
CNTNR URN SCR LID CUP LEK RST (MISCELLANEOUS) ×1 IMPLANT
CONT SPEC 4OZ STRL OR WHT (MISCELLANEOUS) ×2
COTTONBALL LRG STERILE PKG (GAUZE/BANDAGES/DRESSINGS) ×2 IMPLANT
COVER SURGICAL LIGHT HANDLE (MISCELLANEOUS) ×2 IMPLANT
DECANTER SPIKE VIAL GLASS SM (MISCELLANEOUS) IMPLANT
DERMABOND ADVANCED (GAUZE/BANDAGES/DRESSINGS) ×1
DERMABOND ADVANCED .7 DNX12 (GAUZE/BANDAGES/DRESSINGS) ×1 IMPLANT
DRAPE BRACHIAL (DRAPES) ×2 IMPLANT
DRAPE HALF SHEET 40X57 (DRAPES) ×2 IMPLANT
DRAPE SURG 17X23 STRL (DRAPES) ×4 IMPLANT
ELECT REM PT RETURN 9FT ADLT (ELECTROSURGICAL) ×2
ELECTRODE REM PT RTRN 9FT ADLT (ELECTROSURGICAL) ×1 IMPLANT
GLOVE SURG POLYISO LF SZ7.5 (GLOVE) ×2 IMPLANT
GLOVE SURG UNDER POLY LF SZ7.5 (GLOVE) ×2 IMPLANT
GOWN STRL REUS W/ TWL LRG LVL3 (GOWN DISPOSABLE) ×1 IMPLANT
GOWN STRL REUS W/ TWL XL LVL3 (GOWN DISPOSABLE) ×1 IMPLANT
GOWN STRL REUS W/TWL LRG LVL3 (GOWN DISPOSABLE) ×2
GOWN STRL REUS W/TWL XL LVL3 (GOWN DISPOSABLE) ×2
KIT BASIN OR (CUSTOM PROCEDURE TRAY) ×2 IMPLANT
KIT TURNOVER KIT B (KITS) ×2 IMPLANT
LOOP VESSEL MINI RED (MISCELLANEOUS) ×2 IMPLANT
NEEDLE HYPO 25GX1X1/2 BEV (NEEDLE) ×2 IMPLANT
NS IRRIG 1000ML POUR BTL (IV SOLUTION) ×2 IMPLANT
PACK GENERAL/GYN (CUSTOM PROCEDURE TRAY) ×2 IMPLANT
PAD ARMBOARD 7.5X6 YLW CONV (MISCELLANEOUS) ×4 IMPLANT
SPONGE T-LAP 4X18 ~~LOC~~+RFID (SPONGE) ×2 IMPLANT
SUCTION FRAZIER HANDLE 10FR (MISCELLANEOUS) ×2
SUCTION TUBE FRAZIER 10FR DISP (MISCELLANEOUS) ×1 IMPLANT
SUT PROLENE 6 0 BV (SUTURE) ×2 IMPLANT
SUT SILK 3 0 (SUTURE) ×2
SUT SILK 3-0 18XBRD TIE 12 (SUTURE) ×1 IMPLANT
SUT VIC AB 3-0 SH 27 (SUTURE) ×2
SUT VIC AB 3-0 SH 27X BRD (SUTURE) ×1 IMPLANT
SUT VICRYL 4-0 PS2 18IN ABS (SUTURE) IMPLANT
SYR CONTROL 10ML LL (SYRINGE) ×4 IMPLANT
TOWEL GREEN STERILE (TOWEL DISPOSABLE) ×2 IMPLANT
WATER STERILE IRR 1000ML POUR (IV SOLUTION) ×2 IMPLANT

## 2021-07-10 NOTE — Anesthesia Procedure Notes (Signed)
Procedure Name: LMA Insertion Date/Time: 07/10/2021 9:47 AM Performed by: Lance Coon, CRNA Pre-anesthesia Checklist: Patient identified, Emergency Drugs available, Suction available, Patient being monitored and Timeout performed Patient Re-evaluated:Patient Re-evaluated prior to induction Oxygen Delivery Method: Circle system utilized Preoxygenation: Pre-oxygenation with 100% oxygen Induction Type: IV induction LMA: LMA inserted LMA Size: 3.0 Number of attempts: 1 Placement Confirmation: breath sounds checked- equal and bilateral and positive ETCO2 Tube secured with: Tape Dental Injury: Teeth and Oropharynx as per pre-operative assessment

## 2021-07-10 NOTE — Interval H&P Note (Signed)
History and Physical Interval Note:  07/10/2021 9:28 AM  [name]  has presented today for surgery, with the diagnosis of [place].  The various methods of treatment have been discussed with the patient and family. After consideration of risks, benefits and other options for treatment, the patient has consented to  Procedure(s): LEFT TEMPORAL ARTERY BIOPSY (Left) as a surgical intervention.  The patient's history has been reviewed, patient examined, no change in status, stable for surgery.  I have reviewed the patient's chart and labs.  Questions were answered to the patient's satisfaction.     Annamarie Major

## 2021-07-10 NOTE — Anesthesia Postprocedure Evaluation (Signed)
Anesthesia Post Note  Patient: [name]  Procedure(s) Performed: LEFT TEMPORAL ARTERY BIOPSY (Left)     Patient location during evaluation: PACU Anesthesia Type: General Level of consciousness: awake and alert Pain management: pain level controlled Vital Signs Assessment: post-procedure vital signs reviewed and stable Respiratory status: spontaneous breathing, nonlabored ventilation and respiratory function stable Cardiovascular status: blood pressure returned to baseline and stable Postop Assessment: no apparent nausea or vomiting Anesthetic complications: no   No notable events documented.  Last Vitals:  Vitals:   07/10/21 1130 07/10/21 1145  BP: (!) 164/77 (!) 160/75  Pulse: 78 74  Resp: 17 14  Temp:    SpO2: 95% 95%    Last Pain:  Vitals:   07/10/21 1145  TempSrc:   PainSc: 4                  Aldahir Litaker,W. EDMOND

## 2021-07-10 NOTE — Transfer of Care (Signed)
Immediate Anesthesia Transfer of Care Note  Patient: [name]  Procedure(s) Performed: LEFT TEMPORAL ARTERY BIOPSY (Left)  Patient Location: PACU  Anesthesia Type:General  Level of Consciousness: drowsy and patient cooperative  Airway & Oxygen Therapy: Patient Spontanous Breathing and Patient connected to face mask oxygen  Post-op Assessment: Report given to RN and Post -op Vital signs reviewed and stable  Post vital signs: Reviewed and stable  Last Vitals:  Vitals Value Taken Time  BP 167/85 07/10/21 1043  Temp    Pulse 98 07/10/21 1048  Resp 28 07/10/21 1048  SpO2 100 % 07/10/21 1048  Vitals shown include unvalidated device data.  Last Pain:  Vitals:   07/10/21 0859  TempSrc:   PainSc: 3       Patients Stated Pain Goal: 3 ([id number])  Complications: No notable events documented.

## 2021-07-10 NOTE — Op Note (Signed)
    Patient name: [name] MRN: [id number] DOB: March 05, 1956 Sex: female  07/10/2021 Pre-operative Diagnosis: Possible temporal arteritis Post-operative diagnosis:  Same Surgeon:  Annamarie Major Procedure:   Left temporal artery biopsy Anesthesia: General Blood Loss: Minimal Specimens: Left temporal artery to pathology  Indications: This is a 65 year old female with concern for temporal arteritis.  She comes in today for biopsy  Procedure:  The patient was identified in the holding area and taken to [facility]  The patient was then placed supine on the table. general anesthesia was administered.  The patient was prepped and draped in the usual sterile fashion.  A time out was called and antibiotics were administered.  Hand-held Doppler was used to identify the location of the left temporal artery.  Once this was done incision was made over top of it with a 10 blade.  Cautery was used to subcutaneous tissue.  I then dissected out the temporal artery.  There is minimal inflammation around the artery.  Several side branches were divided.  I exposed approximately 6 cm of the temporal artery and ligated proximally distally with 2-0 silk ties.  This was then sent as a specimen to pathology.  The wound was irrigated.  Deep tissue was reapproximated 3-0 Vicryl skin was closed 3-0 Vicryl followed by Dermabond.  Patient was successfully extubated and taken to the recovery room in stable condition.   Disposition: To PACU stable   V. Annamarie Major, M.D., [facility] Office: [phone] Pager:  [phone]

## 2021-07-11 ENCOUNTER — Encounter (HOSPITAL_COMMUNITY): Payer: Self-pay | Admitting: Surgery

## 2021-07-13 ENCOUNTER — Encounter: Payer: Medicare Other | Admitting: Surgery

## 2021-07-13 LAB — SURGICAL PATHOLOGY

## 2021-07-16 ENCOUNTER — Telehealth: Payer: Self-pay

## 2021-07-16 NOTE — Telephone Encounter (Signed)
Patient calls today to report a swollen black eye on her left side where she had a TAB on Friday. Says it's a little sore but doesn't really hurt. Not affecting her vision. She is taking aspirin - advised patient that it is probably okay and should resolve on its own. She may use an ice pack on the area if it helps and knows to call back if area worsens or fails to improve.

## 2021-07-22 ENCOUNTER — Ambulatory Visit: Payer: Medicare Other | Admitting: Obstetrics & Gynecology

## 2021-07-26 ENCOUNTER — Ambulatory Visit (INDEPENDENT_AMBULATORY_CARE_PROVIDER_SITE_OTHER): Payer: Medicare Other | Admitting: Student in an Organized Health Care Education/Training Program

## 2021-07-26 DIAGNOSIS — Z0000 Encounter for general adult medical examination without abnormal findings: Secondary | ICD-10-CM

## 2021-07-26 NOTE — Patient Instructions (Signed)
Health Maintenance, Female Adopting a healthy lifestyle and getting preventive care are important in promoting health and wellness. Ask your health care provider about: The right schedule for you to have regular tests and exams. Things you can do on your own to prevent diseases and keep yourself healthy. What should I know about diet, weight, and exercise? Eat a healthy diet  Eat a diet that includes plenty of vegetables, fruits, low-fat dairy products, and lean protein. Do not eat a lot of foods that are high in solid fats, added sugars, or sodium. Maintain a healthy weight Body mass index (BMI) is used to identify weight problems. It estimates body fat based on height and weight. Your health care provider can help determine your BMI and help you achieve or maintain a healthy weight. Get regular exercise Get regular exercise. This is one of the most important things you can do for your health. Most adults should: Exercise for at least 150 minutes each week. The exercise should increase your heart rate and make you sweat (moderate-intensity exercise). Do strengthening exercises at least twice a week. This is in addition to the moderate-intensity exercise. Spend less time sitting. Even light physical activity can be beneficial. Watch cholesterol and blood lipids Have your blood tested for lipids and cholesterol at 67 years of age, then have this test every 5 years. Have your cholesterol levels checked more often if: Your lipid or cholesterol levels are high. You are older than 67 years of age. You are at high risk for heart disease. What should I know about cancer screening? Depending on your health history and family history, you may need to have cancer screening at various ages. This may include screening for: Breast cancer. Cervical cancer. Colorectal cancer. Skin cancer. Lung cancer. What should I know about heart disease, diabetes, and high blood pressure? Blood pressure and heart  disease High blood pressure causes heart disease and increases the risk of stroke. This is more likely to develop in people who have high blood pressure readings, are of African descent, or are overweight. Have your blood pressure checked: Every 3-5 years if you are 18-39 years of age. Every year if you are 40 years old or older. Diabetes Have regular diabetes screenings. This checks your fasting blood sugar level. Have the screening done: Once every three years after age 40 if you are at a normal weight and have a low risk for diabetes. More often and at a younger age if you are overweight or have a high risk for diabetes. What should I know about preventing infection? Hepatitis B If you have a higher risk for hepatitis B, you should be screened for this virus. Talk with your health care provider to find out if you are at risk for hepatitis B infection. Hepatitis C Testing is recommended for: Everyone born from 1945 through 1965. Anyone with known risk factors for hepatitis C. Sexually transmitted infections (STIs) Get screened for STIs, including gonorrhea and chlamydia, if: You are sexually active and are younger than 67 years of age. You are older than 67 years of age and your health care provider tells you that you are at risk for this type of infection. Your sexual activity has changed since you were last screened, and you are at increased risk for chlamydia or gonorrhea. Ask your health care provider if you are at risk. Ask your health care provider about whether you are at high risk for HIV. Your health care provider may recommend a prescription medicine   to help prevent HIV infection. If you choose to take medicine to prevent HIV, you should first get tested for HIV. You should then be tested every 3 months for as long as you are taking the medicine. Pregnancy If you are about to stop having your period (premenopausal) and you may become pregnant, seek counseling before you get  pregnant. Take 400 to 800 micrograms (mcg) of folic acid every day if you become pregnant. Ask for birth control (contraception) if you want to prevent pregnancy. Osteoporosis and menopause Osteoporosis is a disease in which the bones lose minerals and strength with aging. This can result in bone fractures. If you are 65 years old or older, or if you are at risk for osteoporosis and fractures, ask your health care provider if you should: Be screened for bone loss. Take a calcium or vitamin D supplement to lower your risk of fractures. Be given hormone replacement therapy (HRT) to treat symptoms of menopause. Follow these instructions at home: Lifestyle Do not use any products that contain nicotine or tobacco, such as cigarettes, e-cigarettes, and chewing tobacco. If you need help quitting, ask your health care provider. Do not use street drugs. Do not share needles. Ask your health care provider for help if you need support or information about quitting drugs. Alcohol use Do not drink alcohol if: Your health care provider tells you not to drink. You are pregnant, may be pregnant, or are planning to become pregnant. If you drink alcohol: Limit how much you use to 0-1 drink a day. Limit intake if you are breastfeeding. Be aware of how much alcohol is in your drink. In the [place] one drink equals one 12 oz bottle of beer (355 mL), one 5 oz glass of wine (148 mL), or one 1 oz glass of hard liquor (44 mL). General instructions Schedule regular health, dental, and eye exams. Stay current with your vaccines. Tell your health care provider if: You often feel depressed. You have ever been abused or do not feel safe at home. Summary Adopting a healthy lifestyle and getting preventive care are important in promoting health and wellness. Follow your health care provider's instructions about healthy diet, exercising, and getting tested or screened for diseases. Follow your health care provider's  instructions on monitoring your cholesterol and blood pressure. This information is not intended to replace advice given to you by your health care provider. Make sure you discuss any questions you have with your health care provider. Document Revised: 01/16/2021 Document Reviewed: 11/01/2018 [employer] Patient Education  2022 [employer]  

## 2021-07-26 NOTE — Progress Notes (Signed)
Subjective:   [name] is a 65 y.o. female who presents for Medicare Annual (Subsequent) preventive examination.I connected with  [name] on 07/26/21 by a audio enabled telemedicine application and verified that I am speaking with the correct person using two identifiers.   I discussed the limitations of evaluation and management by telemedicine. The patient expressed understanding and agreed to proceed.  Location of patient:home   Location of provider: office  Persons participating in visit: Manning Avella and Andria Rhein, [facility])  Review of Systems    Defer to PCP       Objective:    Today's Vitals   07/26/21 1632  PainSc: 5    There is no height or weight on file to calculate BMI.  Advanced Directives 07/26/2021 07/10/2021 07/08/2021 02/23/2021 02/16/2021 01/29/2021 01/17/2021  Does Patient Have a Medical Advance Directive? No No No No No No No  Type of Advance Directive - - - - - - -  Does patient want to make changes to medical advance directive? - - No - Patient declined - - - -  Copy of [place] in Chart? - - - - - - -  Would patient like information on creating a medical advance directive? No - Patient declined No - Patient declined No - Patient declined No - Patient declined No - Patient declined No - Patient declined No - Patient declined  Pre-existing out of facility DNR order (yellow form or pink MOST form) - - - - - - -  Some encounter information is confidential and restricted. Go to Review Flowsheets activity to see all data.    Current Medications (verified) Outpatient Encounter Medications as of 07/26/2021  Medication Sig   acetaminophen (TYLENOL) 500 MG tablet Take 1,000 mg by mouth daily as needed for moderate pain or headache.   albuterol (PROVENTIL HFA;VENTOLIN HFA) 108 (90 Base) MCG/ACT inhaler Inhale 2 puffs into the lungs every 6 (six) hours as needed for wheezing or shortness of breath.   amLODipine (NORVASC) 5 MG tablet  TAKE 1 TABLET BY MOUTH EVERY DAY (Patient not taking: Reported on 07/07/2021)   aspirin EC 81 MG tablet Take 81 mg by mouth daily.   Cholecalciferol (VITAMIN D HIGH POTENCY) 25 MCG (1000 UT) capsule Take 1,000 Units by mouth daily.    ciclopirox (PENLAC) 8 % solution Apply topically at bedtime. Apply over nail and surrounding skin. Apply daily over previous coat. After seven (7) days, may remove with alcohol and continue cycle. (Patient taking differently: Apply 1 application topically daily as needed (fungus). Apply over nail and surrounding skin. Apply daily over previous coat. After seven (7) days, may remove with alcohol and continue cycle.)   diclofenac Sodium (VOLTAREN) 1 % GEL Apply 2 g topically 4 (four) times daily. (Patient taking differently: Apply 2 g topically 4 (four) times daily as needed (pain).)   esomeprazole (NEXIUM) 20 MG capsule Take 20-40 mg by mouth daily as needed (acid reflux).   fluticasone (FLONASE) 50 MCG/ACT nasal spray PLACE 1 SPRAY INTO BOTH NOSTRILS DAILY. (Patient taking differently: Place 1 spray into both nostrils daily as needed for allergies.)   losartan (COZAAR) 100 MG tablet TAKE 1 TABLET BY MOUTH EVERY DAY   metoprolol tartrate (LOPRESSOR) 25 MG tablet Take 1 tablet (25 mg total) by mouth 2 (two) times daily.   Multiple Vitamin (MULTIVITAMIN WITH MINERALS) TABS tablet Take 1 tablet by mouth every Monday, Wednesday, and Friday.   nystatin cream (MYCOSTATIN) Apply 1  application topically 2 (two) times daily as needed (rash).   oxyCODONE-acetaminophen (PERCOCET/ROXICET) 5-325 MG tablet Take 1 tablet by mouth every 6 (six) hours as needed.   Polyethyl Glycol-Propyl Glycol (SYSTANE FREE OP) Apply 1 tablet to eye daily as needed (For dry eyes).   rosuvastatin (CRESTOR) 20 MG tablet TAKE 1 TABLET BY MOUTH EVERY DAY (Patient not taking: Reported on 07/07/2021)   No facility-administered encounter medications on file as of 07/26/2021.    Allergies  (verified) Cephalosporins, Eicosapentaenoic acid (epa), Fish-derived products, Peanuts [peanut oil], Shrimp [shellfish allergy], Latex, Metronidazole, Ciprofloxacin, Lisinopril, Naproxen, Other, and Prednisone   History: Past Medical History:  Diagnosis Date   Acute urinary retention s/p Foley 01/07/2012   Allergy    Anemia    Asthma    CAD (coronary artery disease)    a. 12/2013 s/p CABG x 5 Dr. Darcey Nora (LIMA to LAD, SVG to diagonal, SVG to OM1, SVG to OM2, SVG to PDA)   Carotid arterial disease ([place])    a. 12/2013 Carotid U/S: 1-39% bilat ICA stenosis.   Carpal tunnel syndrome, bilateral    GERD (gastroesophageal reflux disease)    Hemorrhoids, internal, with bleeding & prolapse 12/13/2011   Hernia, abdominal    History of blood transfusion    CABG and hysterectomy   History of kidney stones    Hyperlipidemia    Hypertension    Iron deficiency 12-07-2011   Keloid scar    a. Sternal Keloid s/p CABG with ongoing pain.   Morbid obesity ([place])    Myocardial infarct ([place])    2015   Neuropathy    Osteoarthritis    Osteoporosis    PONV (postoperative nausea and vomiting)    Seasonal allergies    Sleep apnea    no CPAP machine; sleep study 02/2010 REM AHI 61.7/hr, total sleep REM 14.8/hr   Uterine cancer ([place]) 7/15   clinical stage IA grade 1 endometrioid endometrial cancer   Past Surgical History:  Procedure Laterality Date   ARTERY BIOPSY Left 07/10/2021   Procedure: LEFT TEMPORAL ARTERY BIOPSY;  Surgeon: Serafina Mitchell, MD;  Location: [place];  Service: Vascular;  Laterality: Left;   BREAST REDUCTION SURGERY Bilateral 10/10/2015   Procedure: BILATERAL MAMMARY REDUCTION  (BREAST) WITH FREE NIPPLE GRAFT;  Surgeon: Irene Limbo, MD;  Location: [place];  Service: Plastics;  Laterality: Bilateral;   CARDIAC CATHETERIZATION     CATARACT EXTRACTION Right    COLONOSCOPY  2009   COLONOSCOPY W/ BIOPSIES AND POLYPECTOMY     CORONARY ARTERY BYPASS GRAFT N/A 01/09/2014   Procedure:  CORONARY ARTERY BYPASS GRAFTING (CABG) x 5 using left internal mammary artery and right leg greater saphenous vein harvested endoscopically;  Surgeon: Ivin Poot, MD;  Location: [place];  Service: Open Heart Surgery;  Laterality: N/A;  please use bed extenders and breast binder   INTRAOPERATIVE TRANSESOPHAGEAL ECHOCARDIOGRAM N/A 01/09/2014   Procedure: INTRAOPERATIVE TRANSESOPHAGEAL ECHOCARDIOGRAM;  Surgeon: Ivin Poot, MD;  Location: [place];  Service: Open Heart Surgery;  Laterality: N/A;   KNEE ARTHROSCOPY  1991   KNEE SURGERY Bilateral 1999   LEFT HEART CATHETERIZATION WITH CORONARY ANGIOGRAM N/A 01/04/2014   Procedure: LEFT HEART CATHETERIZATION WITH CORONARY ANGIOGRAM;  Surgeon: Sinclair Grooms, MD;  Location: [facility] C[facility];  Service: Cardiovascular;  Laterality: N/A;   LESION EXCISION WITH COMPLEX REPAIR N/A 05/07/2016   Procedure: COMPLEX REPAIR OF CHEST 16 CM;  Surgeon: Irene Limbo, MD;  Location: [place];  Service: Plastics;  Laterality:  N/A;   MULTIPLE EXTRACTIONS WITH ALVEOLOPLASTY N/A 04/11/2014   Procedure: Extraction of tooth #'s 1,2,8,16 with alveoloplasty, maxillary tuberosity reductions, and gross debridement of remaining teeth.;  Surgeon: Lenn Cal, DDS;  Location: [place];  Service: Oral Surgery;  Laterality: N/A;   NM MYOCAR PERF WALL MOTION  01/2010   dipyridamole myoview - moderate perfusion defect in basal inferoseptal, basal inferior, mid inferoseptal, mid inferior, apical inferior region; EF 56%   TEE WITHOUT CARDIOVERSION  01/2010   EF 60-65%, small, flat, non-infiltrating, calcified, fixed apical/septal mass   TOTAL KNEE ARTHROPLASTY Left 04/29/2011   transanal hemorrhoidal dearterliaization  01/06/12   with external hemorrhoid removal   UNILATERAL SALPINGECTOMY Right 06/03/2014   Procedure: UNILATERAL SALPINGECTOMY;  Surgeon: Lavonia Drafts, MD;  Location: [facility];  Service: Gynecology;  Laterality: Right;   VAGINAL HYSTERECTOMY N/A 06/03/2014   Procedure:  HYSTERECTOMY VAGINAL;  Surgeon: Lavonia Drafts, MD;  Location: [facility];  Service: Gynecology;  Laterality: N/A;   WISDOM TOOTH EXTRACTION     Family History  Problem Relation Age of Onset   Heart attack Mother 58   Heart disease Mother    Hypertension Mother    Diabetes Mother    Hypertension Sister    Heart failure Sister    Heart disease Sister    Diabetes Sister    Kidney disease Sister    Kidney disease Brother    Bipolar disorder Son    Schizophrenia Son    Anxiety disorder Son    Hypertension Daughter    Esophageal cancer Other        nephew   Colon cancer Neg Hx    Pancreatic cancer Neg Hx    Stomach cancer Neg Hx    Liver disease Neg Hx    Rectal cancer Neg Hx    Social History   Socioeconomic History   Marital status: Single    Spouse name: Not on file   Number of children: 2   Years of education: Not on file   Highest education level: Not on file  Occupational History   Occupation: Retired    Fish farm manager: [employer]    Comment: Chiropodist  Tobacco Use   Smoking status: Never   Smokeless tobacco: Never  Vaping Use   Vaping Use: Never used  Substance and Sexual Activity   Alcohol use: No    Alcohol/week: 0.0 standard drinks   Drug use: No   Sexual activity: Not Currently  Other Topics Concern   Not on file  Social History Narrative   Current Social History 08/27/2020        Patient lives alone in a handicap-accessible ground floor apartment which is 1 story. There are not steps up to the entrance the patient uses.       Patient's method of transportation is personal car or family member.      The highest level of education was 2 years of college.      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 [employer]      Right Handed      Identified important Relationships are My grandchildren, daughter, son       Pets : None       Interests / Fun: Plants       Current Stressors: Son with special needs  (Bi-polar/schizophrenia) , he is not living with her        Religious / Personal Beliefs: [employer] T[employer] mentors Restaurant manager, fast food)  S[place]in, RN,BSN          Social Determinants of Health   Financial Resource Strain: Low Risk    Difficulty of Paying Living Expenses: Not hard at all  Food Insecurity: No Food Insecurity   Worried About Charity fundraiser in the Last Year: Never true   Arboriculturist in the Last Year: Never true  Transportation Needs: No Transportation Needs   Lack of Transportation (Medical): No   Lack of Transportation (Non-Medical): No  Physical Activity: Insufficiently Active   Days of Exercise per Week: 3 days   Minutes of Exercise per Session: 20 min  Stress: Not on file  Social Connections: Not on file    Tobacco Counseling Counseling given: Not Answered   Clinical Intake:  Pre-visit preparation completed: Yes  Pain : 0-10 Pain Score: 5  Pain Type: Chronic pain Pain Location: Knee Pain Orientation: Right Pain Descriptors / Indicators: Constant Pain Onset: In the past 7 days Pain Relieving Factors: rest and elevation Effect of Pain on Daily Activities: limiting  Pain Relieving Factors: rest and elevation  Diabetes: No  How often do you need to have someone help you when you read instructions, pamphlets, or other written materials from your doctor or pharmacy?: 3 - Sometimes What is the last grade level you completed in school?: 2 years of college  Diabetic? no  Interpreter Needed?: No  Information entered by :: Viana Sleep, [facility])   Activities of Daily Living In your present state of health, do you have any difficulty performing the following activities: 07/08/2021 07/07/2021  Hearing? N N  Vision? N Y  Comment - blurry  Difficulty concentrating or making decisions? N Y  Comment - "every now and then  Walking or climbing stairs? Y Y  Comment - -  Dressing or bathing? N N  Doing errands, shopping? N N  Comment - -  Some recent  data might be hidden    Patient Care Team: Axel Filler, MD as PCP - General (Internal Medicine) Debara Pickett Nadean Corwin, MD as PCP - Cardiology (Cardiology) Inda Castle, MD (Inactive) as Consulting Physician (Gastroenterology) Warden Fillers, MD as Consulting Physician (Ophthalmology) Earlie Server, MD as Consulting Physician (Orthopedic Surgery) Alda Berthold, DO as Consulting Physician (Neurology)  Indicate any recent Medical Services you may have received from other than [facility] providers in the past year (date may be approximate).     Assessment:   This is a routine wellness examination for Zarai.  Hearing/Vision screen No results found.  Dietary issues and exercise activities discussed:     Goals Addressed   None   Depression Screen PHQ 2/9 Scores 07/26/2021 07/07/2021 02/23/2021 09/08/2020 08/27/2020 07/14/2020 06/16/2020  PHQ - 2 Score 2 0 2 0 0 0 0  PHQ- 9 Score '4 2 3 '$ 0 2 - 2    Fall Risk Fall Risk  07/26/2021 07/07/2021 02/23/2021 02/16/2021 11/27/2020  Falls in the past year? 0 0 0 0 0  Comment - - - - -  Number falls in past yr: 0 0 0 0 0  Injury with Fall? 0 0 0 0 0  Risk for fall due to : No Fall Risks Impaired balance/gait Impaired balance/gait - -  Risk for fall due to: Comment - - - - -  Follow up Falls evaluation completed Falls evaluation completed Falls evaluation completed - -    FALL RISK PREVENTION PERTAINING TO THE HOME:  Any stairs in or around the home? No  If so, are there any without handrails? No  Home free of loose throw rugs in walkways, pet beds, electrical cords, etc? No  Adequate lighting in your home to reduce risk of falls? Yes   ASSISTIVE DEVICES UTILIZED TO PREVENT FALLS:  Life alert? No  Use of a cane, walker or w/c? Yes  Grab bars in the bathroom? Yes  Shower chair or bench in shower? Yes  Elevated toilet seat or a handicapped toilet? Yes   TIMED UP AND GO:  Was the test performed? No .  Length of time to ambulate 10  feet:  sec.     Cognitive Function:     6CIT Screen 07/26/2021  What Year? 0 points  What month? 0 points  What time? 0 points  Count back from 20 0 points  Months in reverse 0 points  Repeat phrase 6 points  Total Score 6    Immunizations Immunization History  Administered Date(s) Administered   Influenza Split 01/07/2012   Influenza Whole 10/31/2007, 09/18/2009, 09/25/2010   Influenza, Seasonal, Injecte, Preservative Fre 11/28/2012   Influenza,inj,Quad PF,6+ Mos 08/07/2013, 01/02/2014, 08/01/2015, 09/20/2016, 09/05/2017, 10/16/2018   PFIZER(Purple Top)SARS-COV-2 Vaccination 04/10/2020, 05/05/2020   Pneumococcal Conjugate-13 07/14/2020   Pneumococcal Polysaccharide-23 01/07/2012, 10/24/2017   Td 10/15/2009   Tdap 02/23/2021    TDAP status: Up to date  Flu Vaccine status: Due, Education has been provided regarding the importance of this vaccine. Advised may receive this vaccine at local pharmacy or Health Dept. Aware to provide a copy of the vaccination record if obtained from local pharmacy or Health Dept. Verbalized acceptance and understanding.  Pneumococcal vaccine status: Up to date  Covid-19 vaccine status: Information provided on how to obtain vaccines.   Qualifies for Shingles Vaccine? Yes   Zostavax completed No   Shingrix Completed?: No.    Education has been provided regarding the importance of this vaccine. Patient has been advised to call insurance company to determine out of pocket expense if they have not yet received this vaccine. Advised may also receive vaccine at local pharmacy or Health Dept. Verbalized acceptance and understanding.  Screening Tests Health Maintenance  Topic Date Due   Zoster Vaccines- Shingrix (1 of 2) Never done   COVID-19 Vaccine (3 - Booster for Pfizer series) 10/05/2020   INFLUENZA VACCINE  06/22/2021   MAMMOGRAM  07/22/2022   PNA vac Low Risk Adult (2 of 2 - PPSV23) 10/24/2022   COLONOSCOPY (Pts 45-69yr Insurance coverage  will need to be confirmed)  09/16/2027   TETANUS/TDAP  02/24/2031   DEXA SCAN  Completed   Hepatitis C Screening  Completed   HPV VACCINES  Aged Out    Health Maintenance  Health Maintenance Due  Topic Date Due   Zoster Vaccines- Shingrix (1 of 2) Never done   COVID-19 Vaccine (3 - Booster for Pfizer series) 10/05/2020   INFLUENZA VACCINE  06/22/2021    Colorectal cancer screening: Type of screening: Colonoscopy. Completed 09/15/20. Repeat every 7 years  Mammogram status: Completed 07/22/20. Repeat every year  Bone Density status: Ordered DEXA. Pt provided with contact info and advised to call to schedule appt.  Lung Cancer Screening: (Low Dose CT Chest recommended if Age 67-80years, 30 pack-year currently smoking OR have quit w/in 15years.) does not qualify.   Lung Cancer Screening Referral:   Additional Screening:  Hepatitis C Screening: does not qualify; Completed   Vision Screening: Recommended annual ophthalmology exams for early detection of glaucoma and other disorders of the eye. Is  the patient up to date with their annual eye exam?  Yes  Who is the provider or what is the name of the office in which the patient attends annual eye exams? Dr. Warden Fillers If pt is not established with a provider, would they like to be referred to a provider to establish care? No .   Dental Screening: Recommended annual dental exams for proper oral hygiene  Community Resource Referral / Chronic Care Management: CRR required this visit?  Yes   CCM required this visit?  No      Plan:     I have personally reviewed and noted the following in the patient's chart:   Medical and social history Use of alcohol, tobacco or illicit drugs  Current medications and supplements including opioid prescriptions.  Functional ability and status Nutritional status Physical activity Advanced directives List of other physicians Hospitalizations, surgeries, and ER visits in previous 12  months Vitals Screenings to include cognitive, depression, and falls Referrals and appointments  In addition, I have reviewed and discussed with patient certain preventive protocols, quality metrics, and best practice recommendations. A written personalized care plan for preventive services as well as general preventive health recommendations were provided to patient.     Dema Severin, [facility]   07/26/2021   Nurse Notes: Non-face to face 50 minute visit   Ms. Domzalski , Thank you for taking time to come for your Medicare Wellness Visit. I appreciate your ongoing commitment to your health goals. Please review the following plan we discussed and let me know if I can assist you in the future.   These are the goals we discussed:  Goals       Blood Pressure < 140/90      Exercise 3x per week (20 min per time)      Increase physical activity (pt-stated)      LDL CALC < 130      Weight (lb) < 273 lb (123.8 kg) (pt-stated)      7% weight loss        This is a list of the screening recommended for you and due dates:  Health Maintenance  Topic Date Due   Zoster (Shingles) Vaccine (1 of 2) Never done   COVID-19 Vaccine (3 - Booster for Pfizer series) 10/05/2020   Flu Shot  06/22/2021   Mammogram  07/22/2022   Pneumonia vaccines (2 of 2 - PPSV23) 10/24/2022   Colon Cancer Screening  09/16/2027   Tetanus Vaccine  02/24/2031   DEXA scan (bone density measurement)  Completed   Hepatitis C Screening: USPSTF Recommendation to screen - Ages 14-79 yo.  Completed   HPV Vaccine  Aged Out

## 2021-07-29 ENCOUNTER — Ambulatory Visit (INDEPENDENT_AMBULATORY_CARE_PROVIDER_SITE_OTHER): Payer: Medicare Other | Admitting: Internal Medicine

## 2021-07-29 ENCOUNTER — Encounter: Payer: Self-pay | Admitting: Student in an Organized Health Care Education/Training Program

## 2021-07-29 ENCOUNTER — Encounter: Payer: Self-pay | Admitting: Internal Medicine

## 2021-07-29 VITALS — BP 142/68 | HR 67 | Temp 98.6°F | Ht 68.0 in | Wt 283.4 lb

## 2021-07-29 DIAGNOSIS — M1711 Unilateral primary osteoarthritis, right knee: Secondary | ICD-10-CM

## 2021-07-29 NOTE — Assessment & Plan Note (Addendum)
Patient has a history of osteoarthritis in her right knee with total knee arthroplasty lasting her left.  She was getting routine steroid injections roughly every 4 to 5 months and right knee but has not had an injection since the onset of COVID.  She has been experiencing consistent pain since then.  She reports that yesterday she had new worsening pain which she described as sharp and stabbing with associated edema and warmth with trouble walking prompting her to schedule her visit.  She reports she has been unable to make it to the bathroom because she is unable to move quickly now secondary to pain 2 Tylenol yesterday with improvement.  Knee is not as swollen today and is only mildly warm to the touch.  No erythema noted.  Patient is denying any fever or chills no trauma to the knee.  Today she is still having pain but it has improved compared to yesterday.  No tenderness to palpation today with some soft tissue swelling at the superior lateral aspect of the patella.  No erythema.  Full range of passive motion active range of motion limited secondary to pain.  We will schedule for return visit in 2 to 3 weeks for knee injection with cortisone.  In the meantime advised to elevate her knee using ice and Tylenol for pain control as needed. Order for DME bariatric walker was reordered.

## 2021-07-29 NOTE — Progress Notes (Signed)
   CC: Right knee pain  HPI:[name] is a 65 y.o. female who presents for evaluation of right knee pain. Please see individual problem based A/P for details.  Please see encounters tab for problem-based charting.  Depression, PHQ-9: Based on the patients  Flowsheet Row Clinical Support from 07/26/2021 in [facility]  PHQ-9 Total Score 4      score we have 4.  Past Medical History:  Diagnosis Date   Acute urinary retention s/p Foley 01/07/2012   Allergy    Anemia    Asthma    CAD (coronary artery disease)    a. 12/2013 s/p CABG x 5 Dr. Darcey Nora (LIMA to LAD, SVG to diagonal, SVG to OM1, SVG to OM2, SVG to PDA)   Carotid arterial disease ([place])    a. 12/2013 Carotid U/S: 1-39% bilat ICA stenosis.   Carpal tunnel syndrome, bilateral    GERD (gastroesophageal reflux disease)    Hemorrhoids, internal, with bleeding & prolapse 12/13/2011   Hernia, abdominal    History of blood transfusion    CABG and hysterectomy   History of kidney stones    Hyperlipidemia    Hypertension    Iron deficiency 12-07-2011   Keloid scar    a. Sternal Keloid s/p CABG with ongoing pain.   Morbid obesity ([place])    Myocardial infarct ([place])    2015   Neuropathy    Osteoarthritis    Osteoporosis    PONV (postoperative nausea and vomiting)    Seasonal allergies    Sleep apnea    no CPAP machine; sleep study 02/2010 REM AHI 61.7/hr, total sleep REM 14.8/hr   Uterine cancer ([place]) 7/15   clinical stage IA grade 1 endometrioid endometrial cancer   Review of Systems:   Review of Systems  Constitutional: Negative.   HENT: Negative.    Eyes: Negative.   Respiratory: Negative.    Cardiovascular: Negative.   Gastrointestinal: Negative.   Genitourinary: Negative.   Musculoskeletal:  Positive for joint pain.  Skin: Negative.   Neurological: Negative.   Psychiatric/Behavioral: Negative.   Alert and oriented no acute distress.  Physical Exam: There were no vitals filed for  this visit.   General: Alert and oriented no acute distress, morbidly obese HEENT: Conjunctiva nl , antiicteric sclerae, moist mucous membranes, no exudate or erythema Cardiovascular: Normal rate, regular rhythm.  No murmurs, rubs, or gallops Pulmonary : Equal breath sounds, No wheezes, rales, or rhonchi Abdominal: soft, nontender,  bowel sounds present Ext: Right knee no tenderness to palpation though some soft tissue tissue swelling of the superior lateral aspect of the patella no erythema full range of passive motion with active range of motion limited secondary to pain.  Assessment & Plan:   See Encounters Tab for problem based charting.  Patient seen with Dr. Jimmye Norman

## 2021-07-29 NOTE — Patient Instructions (Addendum)
Dear Mrs. Enyeart,   Thank you for trusting [place] with your care today.  Today we discussed your knee pain. This pain is likely related to your osteoarthritis. We will schedule you for a follow up visit for a cortisone injection of your knee in 2-3 weeks. In the mean time, please continue to use tylenol and ice to control the pain.

## 2021-07-30 ENCOUNTER — Telehealth: Payer: Self-pay | Admitting: *Deleted

## 2021-07-30 DIAGNOSIS — M179 Osteoarthritis of knee, unspecified: Secondary | ICD-10-CM | POA: Diagnosis not present

## 2021-07-30 NOTE — Telephone Encounter (Signed)
Myriam Jacobson, Mamie C, [place]; [place], [place]; [place], Adolph Pollack; 2 others  Received! Thank you!    ----- Message -----  From: Judge Stall, [place]  Sent: 07/30/2021   9:19 AM EDT  To: Darlina Guys, Chapman Fitch, *  Subject: DME order in for a walker                       Good morning Patients weight is 283# height is 71f 9. Order is in. [facility] you [facility]

## 2021-08-03 ENCOUNTER — Ambulatory Visit (INDEPENDENT_AMBULATORY_CARE_PROVIDER_SITE_OTHER): Payer: Medicare Other | Admitting: Physician Assistant

## 2021-08-03 ENCOUNTER — Other Ambulatory Visit: Payer: Self-pay

## 2021-08-03 VITALS — BP 129/65 | HR 58 | Temp 97.6°F | Resp 14 | Ht 68.0 in | Wt 281.0 lb

## 2021-08-03 DIAGNOSIS — M316 Other giant cell arteritis: Secondary | ICD-10-CM

## 2021-08-03 NOTE — Progress Notes (Signed)
POST OPERATIVE OFFICE NOTE    CC:  F/u for surgery  HPI:  This is a 65 y.o. female who is s/p left temporal artery biopsy on 07/10/21  by Dr. Trula Slade.    Pt returns today for follow up.  Pt states She has not had problems with the incision over all except itching.  She continues to have HA.    Allergies  Allergen Reactions   Cephalosporins Anaphylaxis, Swelling and Other (See Comments)    Tongue swelling, gum pain   Eicosapentaenoic Acid (Epa) Anaphylaxis and Shortness Of Breath   Fish-Derived Products Anaphylaxis and Shortness Of Breath   Peanuts [Peanut Oil] Anaphylaxis, Shortness Of Breath and Other (See Comments)    Peanut butter   Shrimp [Shellfish Allergy] Anaphylaxis    Pt states her throat will swell if she eats shrimp.   Latex Itching   Metronidazole Other (See Comments)    Palpitations, mild SOB, metallic taste, dry mouth, high blood pressure   Ciprofloxacin Other (See Comments)    Gaging and achy   Lisinopril Cough        Naproxen Nausea Only and Other (See Comments)    Headache   Other Itching and Other (See Comments)     All pain meds make her itch - has to have something to prevent that in addition to receiving med   Prednisone Other (See Comments)    Heart beating fast Ok to take low dosage      Current Outpatient Medications  Medication Sig Dispense Refill   acetaminophen (TYLENOL) 500 MG tablet Take 1,000 mg by mouth daily as needed for moderate pain or headache.     albuterol (PROVENTIL HFA;VENTOLIN HFA) 108 (90 Base) MCG/ACT inhaler Inhale 2 puffs into the lungs every 6 (six) hours as needed for wheezing or shortness of breath. 1 Inhaler 0   aspirin EC 81 MG tablet Take 81 mg by mouth daily.     Cholecalciferol (VITAMIN D HIGH POTENCY) 25 MCG (1000 UT) capsule Take 1,000 Units by mouth daily.      ciclopirox (PENLAC) 8 % solution Apply topically at bedtime. Apply over nail and surrounding skin. Apply daily over previous coat. After seven (7) days, may  remove with alcohol and continue cycle. (Patient taking differently: Apply 1 application topically daily as needed (fungus). Apply over nail and surrounding skin. Apply daily over previous coat. After seven (7) days, may remove with alcohol and continue cycle.) 6.6 mL 2   diclofenac Sodium (VOLTAREN) 1 % GEL Apply 2 g topically 4 (four) times daily. (Patient taking differently: Apply 2 g topically 4 (four) times daily as needed (pain).) 150 g 2   esomeprazole (NEXIUM) 20 MG capsule Take 20-40 mg by mouth daily as needed (acid reflux).     fluticasone (FLONASE) 50 MCG/ACT nasal spray PLACE 1 SPRAY INTO BOTH NOSTRILS DAILY. (Patient taking differently: Place 1 spray into both nostrils daily as needed for allergies.) 16 mL 2   losartan (COZAAR) 100 MG tablet TAKE 1 TABLET BY MOUTH EVERY DAY 90 tablet 3   metoprolol tartrate (LOPRESSOR) 25 MG tablet Take 1 tablet (25 mg total) by mouth 2 (two) times daily. 180 tablet 0   Multiple Vitamin (MULTIVITAMIN WITH MINERALS) TABS tablet Take 1 tablet by mouth every Monday, Wednesday, and Friday.     nystatin cream (MYCOSTATIN) Apply 1 application topically 2 (two) times daily as needed (rash).     Polyethyl Glycol-Propyl Glycol (SYSTANE FREE OP) Apply 1 tablet to eye daily as needed (  For dry eyes).     rosuvastatin (CRESTOR) 20 MG tablet TAKE 1 TABLET BY MOUTH EVERY DAY 90 tablet 3   amLODipine (NORVASC) 5 MG tablet TAKE 1 TABLET BY MOUTH EVERY DAY (Patient not taking: No sig reported) 90 tablet 3   oxyCODONE-acetaminophen (PERCOCET/ROXICET) 5-325 MG tablet Take 1 tablet by mouth every 6 (six) hours as needed. (Patient not taking: Reported on 08/03/2021) 10 tablet 0   No current facility-administered medications for this visit.     ROS:  See HPI  Physical Exam:    Incision:  Well healed left temporal incision.  Skin glue peeled off. Extremities:  moving all 4 ext.  A & O x 3 without neurologic deficits.     Assessment/Plan:  This is a 65 y.o. female who  is s/p:left temporal artery biopsy.   SURGICAL PATHOLOGY  CASE: [phone]  PATIENT: Joyice Faster  Surgical Pathology Report   Clinical History: giant cell arteritis (cm)    FINAL MICROSCOPIC DIAGNOSIS:   A. TEMPORAL ARTERY, LEFT, BIOPSY:  - Segment of a small caliber artery with focal medial calcifications  - Negative for chronic inflammation, giant cells, granulomas or  fragmentation of elastic lamina   F/U PRN  Roxy Horseman PA-C [facility] MD:  Trula Slade

## 2021-08-06 ENCOUNTER — Other Ambulatory Visit: Payer: Self-pay | Admitting: Internal Medicine

## 2021-08-10 NOTE — Progress Notes (Signed)
Internal Medicine Clinic Attending  I saw and evaluated the patient.  I personally confirmed the key portions of the history and exam documented by Dr. Gawaluck and I reviewed pertinent patient test results.  The assessment, diagnosis, and plan were formulated together and I agree with the documentation in the resident's note.  

## 2021-08-13 ENCOUNTER — Encounter: Payer: Self-pay | Admitting: Internal Medicine

## 2021-08-13 ENCOUNTER — Other Ambulatory Visit: Payer: Self-pay

## 2021-08-13 ENCOUNTER — Ambulatory Visit (INDEPENDENT_AMBULATORY_CARE_PROVIDER_SITE_OTHER): Payer: Medicare Other | Admitting: Internal Medicine

## 2021-08-13 VITALS — BP 172/86 | HR 57 | Temp 98.2°F | Ht 68.0 in | Wt 279.9 lb

## 2021-08-13 DIAGNOSIS — M25561 Pain in right knee: Secondary | ICD-10-CM | POA: Diagnosis not present

## 2021-08-13 DIAGNOSIS — M1711 Unilateral primary osteoarthritis, right knee: Secondary | ICD-10-CM

## 2021-08-13 LAB — SYNOVIAL CELL COUNT + DIFF, W/ CRYSTALS
Crystals, Fluid: NONE SEEN
Eosinophils-Synovial: 0 % (ref 0–1)
Lymphocytes-Synovial Fld: 5 % (ref 0–20)
Monocyte-Macrophage-Synovial Fluid: 81 % (ref 50–90)
Neutrophil, Synovial: 14 % (ref 0–25)
WBC, Synovial: 348 /mm3 — ABNORMAL HIGH (ref 0–200)

## 2021-08-13 NOTE — Patient Instructions (Addendum)
Dear [name],  Thank you for trusting [place] with your care today.   Today we performed a knee injection to help with symptomatic control of your osteoarthritis. This injection should help with the pain for approximately 3 months.  Please monitor for signs of infection including redness, swelling, warmth, and pain. If you notice any of these signs, please call our office immediately.  I will call you tomorrow to check in and see how you are doing.

## 2021-08-13 NOTE — Progress Notes (Signed)
CC: knee injection  HPI:[name] is a 65 y.o. female who presents for evaluation of knee injection. Please see individual problem based A/P for details.  Please see encounters tab for problem based charting.   Knee Arthrocentesis with Injection Procedure Note  Diagnosis: right knee  Indications: Symptom relief from osteoarthritis  Anesthesia: Lidocaine 2% without epinephrine  Procedure Details   Point of care ultrasound was used to identify the joint effusion and plan needle trajectory and depth. Consent was obtained for the procedure. The joint was prepped with Betadine. A 22 gauge needle was inserted into the superior aspect of the joint from a medial approach to access the suprapatellar pouch. 15 ml of clear yellow fluid was removed from the joint and sent to the lab for analysis. 2 ml 1% lidocaine and 5 ml of Triamcinolone was then injected into the joint through the same needle. The needle was removed and the area cleansed and dressed.  Complications:  None; patient tolerated the procedure well.   Depression, PHQ-9: Based on the patients  [place] Visit from 08/13/2021 in [place]  PHQ-9 Total Score 0      score we have 0.  Past Medical History:  Diagnosis Date   Acute urinary retention s/p Foley 01/07/2012   Allergy    Anemia    Asthma    CAD (coronary artery disease)    a. 12/2013 s/p CABG x 5 Dr. Darcey Nora (LIMA to LAD, SVG to diagonal, SVG to OM1, SVG to OM2, SVG to PDA)   Carotid arterial disease ([place])    a. 12/2013 Carotid U/S: 1-39% bilat ICA stenosis.   Carpal tunnel syndrome, bilateral    GERD (gastroesophageal reflux disease)    Hemorrhoids, internal, with bleeding & prolapse 12/13/2011   Hernia, abdominal    History of blood transfusion    CABG and hysterectomy   History of kidney stones    Hyperlipidemia    Hypertension    Iron deficiency 12-07-2011   Keloid scar    a. Sternal Keloid s/p CABG with ongoing  pain.   Morbid obesity ([place])    Myocardial infarct ([place])    2015   Neuropathy    Osteoarthritis    Osteoporosis    PONV (postoperative nausea and vomiting)    Seasonal allergies    Sleep apnea    no CPAP machine; sleep study 02/2010 REM AHI 61.7/hr, total sleep REM 14.8/hr   Uterine cancer ([place]) 7/15   clinical stage IA grade 1 endometrioid endometrial cancer   Review of Systems:   Review of Systems  Constitutional: Negative.   HENT: Negative.    Eyes: Negative.   Respiratory: Negative.    Cardiovascular: Negative.   Gastrointestinal: Negative.   Genitourinary: Negative.   Musculoskeletal: Negative.   Skin: Negative.   Neurological: Negative.   Psychiatric/Behavioral: Negative.      Physical Exam: Vitals:   08/13/21 0951  BP: (!) 172/86  Pulse: (!) 57  Temp: 98.2 F (36.8 C)  TempSrc: Oral  SpO2: 98%  Weight: 279 lb 14.4 oz (127 kg)  Height: 5\' 8"  (1.727 m)     General: alert and oriented HEENT: Conjunctiva nl , antiicteric sclerae, moist mucous membranes, no exudate or erythema Cardiovascular: Normal rate, regular rhythm.  No murmurs, rubs, or gallops Pulmonary : Equal breath sounds, No wheezes, rales, or rhonchi Abdominal: soft, nontender,  bowel sounds present Ext: No edema in lower extremities, no tenderness to palpation of lower extremities.  Assessment & Plan:   See Encounters Tab for problem based charting.  Patient seen with Dr. Evette Doffing

## 2021-08-13 NOTE — Progress Notes (Deleted)
   CC: 2 week f/u for knee injection  HPI:[name] is a 65 y.o. female who presents for evaluation of 2 week f/u knee injection. Please see individual problem based A/P for details.  Osteoarthritis right knee. Hx total knee arthroplast left - has been receiving steroid injections every 4-5 months prior to onset of COVID.  - worsening pain with assoicated edema and warmth with trouble walking, scheduled for knee injection. - was advised to elevate knee and use ice and tylenol for pain control. DME bariatric walker was reordered. - No fever or chills, no trauma - N/V/D, dysuria? - new pain?  40mg  triamcinolone 56ml lidocaine  Used 18 g need to access joint space Local anesthisia with lodcaine with out bicarb   Depression, PHQ-9: Based on the patients  [place] Visit from 07/26/2021 in [place]  PHQ-9 Total Score 4      score we have ***.  Past Medical History:  Diagnosis Date   Acute urinary retention s/p Foley 01/07/2012   Allergy    Anemia    Asthma    CAD (coronary artery disease)    a. 12/2013 s/p CABG x 5 Dr. Darcey Nora (LIMA to LAD, SVG to diagonal, SVG to OM1, SVG to OM2, SVG to PDA)   Carotid arterial disease ([place])    a. 12/2013 Carotid U/S: 1-39% bilat ICA stenosis.   Carpal tunnel syndrome, bilateral    GERD (gastroesophageal reflux disease)    Hemorrhoids, internal, with bleeding & prolapse 12/13/2011   Hernia, abdominal    History of blood transfusion    CABG and hysterectomy   History of kidney stones    Hyperlipidemia    Hypertension    Iron deficiency 12-07-2011   Keloid scar    a. Sternal Keloid s/p CABG with ongoing pain.   Morbid obesity ([place])    Myocardial infarct ([place])    2015   Neuropathy    Osteoarthritis    Osteoporosis    PONV (postoperative nausea and vomiting)    Seasonal allergies    Sleep apnea    no CPAP machine; sleep study 02/2010 REM AHI 61.7/hr, total sleep REM 14.8/hr   Uterine cancer  ([place]) 7/15   clinical stage IA grade 1 endometrioid endometrial cancer   Review of Systems:   ROS   Physical Exam: There were no vitals filed for this visit.   General: *** HEENT: Conjunctiva nl , antiicteric sclerae, moist mucous membranes, no exudate or erythema Cardiovascular: Normal rate, regular rhythm.  No murmurs, rubs, or gallops Pulmonary : Equal breath sounds, No wheezes, rales, or rhonchi Abdominal: soft, nontender,  bowel sounds present Ext: No edema in lower extremities, no tenderness to palpation of lower extremities.   Assessment & Plan:   See Encounters Tab for problem based charting.  Patient {GC/GE:[id number]::"discussed with","seen with"} Dr. {[id number]:[id number]::"B. Hoffman","Guilloud","Mullen","Narendra","Raines","Vincent","Williams"}

## 2021-08-17 ENCOUNTER — Encounter: Payer: Medicare Other | Admitting: Student in an Organized Health Care Education/Training Program

## 2021-08-17 ENCOUNTER — Encounter: Payer: Self-pay | Admitting: Internal Medicine

## 2021-08-17 NOTE — Assessment & Plan Note (Signed)
Patient following up for aspiration and steroid injection right knee. Reports continued pain. No fever or chills. No overlying cellulitis.   Knee arthrocentesis performed with 41ml straw colored fluid drawn and sent for analysis. 64ml kenalog injected into knee. Patient tolerated procedure well.

## 2021-08-18 NOTE — Progress Notes (Signed)
Internal Medicine Clinic Attending  I saw and evaluated the patient.  I personally confirmed the key portions of the history and exam documented by Dr. Gawaluck and I reviewed pertinent patient test results.  The assessment, diagnosis, and plan were formulated together and I agree with the documentation in the resident's note.  

## 2021-08-28 NOTE — Progress Notes (Signed)
Internal Medicine Clinic Attending  I reviewed the AWV findings.  I agree with the assessment, diagnosis, and plan of care documented in the AWV note.     

## 2021-09-07 ENCOUNTER — Encounter: Payer: Self-pay | Admitting: Student in an Organized Health Care Education/Training Program

## 2021-09-07 ENCOUNTER — Ambulatory Visit (INDEPENDENT_AMBULATORY_CARE_PROVIDER_SITE_OTHER): Payer: Medicare Other | Admitting: Student in an Organized Health Care Education/Training Program

## 2021-09-07 VITALS — BP 141/70 | HR 51 | Temp 98.4°F | Ht 68.0 in | Wt 282.3 lb

## 2021-09-07 DIAGNOSIS — M7582 Other shoulder lesions, left shoulder: Secondary | ICD-10-CM

## 2021-09-07 DIAGNOSIS — I10 Essential (primary) hypertension: Secondary | ICD-10-CM | POA: Diagnosis not present

## 2021-09-07 DIAGNOSIS — R7302 Impaired glucose tolerance (oral): Secondary | ICD-10-CM | POA: Diagnosis not present

## 2021-09-07 DIAGNOSIS — L910 Hypertrophic scar: Secondary | ICD-10-CM | POA: Insufficient documentation

## 2021-09-07 DIAGNOSIS — M7581 Other shoulder lesions, right shoulder: Secondary | ICD-10-CM | POA: Diagnosis not present

## 2021-09-07 LAB — POCT GLYCOSYLATED HEMOGLOBIN (HGB A1C): Hemoglobin A1C: 4.8 % (ref 4.0–5.6)

## 2021-09-07 LAB — GLUCOSE, CAPILLARY: Glucose-Capillary: 114 mg/dL — ABNORMAL HIGH (ref 70–99)

## 2021-09-07 NOTE — Assessment & Plan Note (Signed)
Weight today 282 pounds, BMI 42.  Weight is about stable over the last 1 year.  Her peak weight back in 2020 was around 320 pounds.  She is making some good changes, increasing exercise, we talked about nutrition.

## 2021-09-07 NOTE — Assessment & Plan Note (Signed)
Mild to moderate fluctuating pain of both shoulders, exam consistent with rotator cuff tendinitis.  Left seems to be a little worse than the right.  We talked about lifestyle modifications, talked about exercise and minutes, no swelling, waxing and waning course.  Given no functional admitting pain I do not think the steroid injection would be helpful today.  She can return to the office if she has a flare of tendinitis that becomes function limiting to try subacromial steroid injection.

## 2021-09-07 NOTE — Assessment & Plan Note (Signed)
Blood pressure with acceptable control today.  Plan to continue with losartan 100 mg, amlodipine 5 mg, and metoprolol 25 mg twice daily.  Labs were appropriate in August.

## 2021-09-07 NOTE — Assessment & Plan Note (Signed)
History of elevated glucose on BMP, A1c's have been appropriate, has metabolic syndrome with obesity, hyperlipidemia, atherosclerosis, so will continue checking A1c once a year.

## 2021-09-07 NOTE — Assessment & Plan Note (Signed)
Coronary artery bypass surgery 7 years ago complicated by keloid scar formation at the mid sternum.  She had dermal steroid injections and ultimately required surgical repair of the scar in 2017.  She is having a recurrence of keloids that are painful and bothersome at the lower portion of the scar.  No signs of cellulitis or other inflammation.  We will refer her to dermatology to try dermal steroid injections again versus go back for surgical repair with plastic surgery.

## 2021-09-07 NOTE — Progress Notes (Signed)
   Assessment and Plan:  See Encounters tab for problem-based medical decision making.   __________________________________________________________  HPI:   65 year old person here for follow-up of hypertension.  Doing very well.  I last saw her about a month ago for a steroid injection into her right knee which had good benefits.  Denies any recent illness, no fevers or chills, no chest pain or shortness of breath with exertion.  She reports good adherence with her medications, no adverse side effects.  No recent hospitalizations, ED visits, or urgent care visits.  Lives independently, ambulates with the assistance of a cane.  Having some increasing pain in both of her shoulders, cannot lift her left arm above her head, having some symptoms at night, currently not function limiting.  Reports seeing orthopedic surgeon in the past 2 offered her surgical correction for rotator cuff disease, but she declined at that time because of other comorbidities.  Also reports a painful keloid scar at her lower sternum at the site of her coronary artery bypass surgery.  Says that it bothers her especially at night.  __________________________________________________________  Problem List: Patient Active Problem List   Diagnosis Date Noted   Atherosclerosis of aorta ([place]) 06/20/2015    Priority: 1.   Coronary atherosclerosis of native coronary artery 01/05/2014    Priority: 1.   Osteoarthritis of right knee 11/07/2014    Priority: 2.   Morbid obesity due to excess calories ([place]) 05/10/2012    Priority: 2.   Obstructive sleep apnea 03/27/2010    Priority: 2.   Essential hypertension 06/30/2007    Priority: 2.   Pes planus of left foot 09/08/2020    Priority: 3.   Hallux valgus 09/08/2020    Priority: 3.   Glucose intolerance (impaired glucose tolerance) 07/14/2020    Priority: 3.   Vitamin D deficiency 07/14/2020    Priority: 3.   Depression 10/16/2018    Priority: 3.   Osteoarthritis of left  shoulder 10/24/2017    Priority: 3.   Peripheral neuropathy 08/19/2017    Priority: 3.   Health care maintenance 10/09/2014    Priority: 3.   GERD 08/06/2008    Priority: 3.   Hyperlipidemia 10/31/2007    Priority: 3.   Tendinitis of both rotator cuffs 09/07/2021   Keloid scar of skin 09/07/2021    Medications: Reconciled today in Epic __________________________________________________________  Physical Exam:  Vital Signs: Vitals:   09/07/21 0957  BP: (!) 167/79  Pulse: (!) 54  Temp: 98.4 F (36.9 C)  SpO2: 99%  Weight: 282 lb 4.8 oz (128.1 kg)    Gen: Well appearing, NAD Neck: No cervical LAD, No thyromegaly or nodules, No JVD. CV: RRR, no murmurs Pulm: Normal effort, CTA throughout, no wheezing Ext: Warm, no edema, impingement syndrome on the left shoulder, can abduct only about 90 degrees, full strength and good range of motion, no pain with passive range of motion.  Some mild tenderness on the right empty can test. Skin: Lower sternum bypass scar has about a 4 cm linear keloid without erythema, firm and tender to the touch.

## 2021-11-08 ENCOUNTER — Other Ambulatory Visit: Payer: Self-pay | Admitting: Student in an Organized Health Care Education/Training Program

## 2021-11-20 DIAGNOSIS — H04123 Dry eye syndrome of bilateral lacrimal glands: Secondary | ICD-10-CM | POA: Diagnosis not present

## 2021-11-20 DIAGNOSIS — Z961 Presence of intraocular lens: Secondary | ICD-10-CM | POA: Diagnosis not present

## 2021-11-20 DIAGNOSIS — H25812 Combined forms of age-related cataract, left eye: Secondary | ICD-10-CM | POA: Diagnosis not present

## 2021-11-20 DIAGNOSIS — H43823 Vitreomacular adhesion, bilateral: Secondary | ICD-10-CM | POA: Diagnosis not present

## 2021-11-20 DIAGNOSIS — H53021 Refractive amblyopia, right eye: Secondary | ICD-10-CM | POA: Diagnosis not present

## 2021-11-20 DIAGNOSIS — R51 Headache with orthostatic component, not elsewhere classified: Secondary | ICD-10-CM | POA: Diagnosis not present

## 2021-12-16 ENCOUNTER — Ambulatory Visit (INDEPENDENT_AMBULATORY_CARE_PROVIDER_SITE_OTHER): Payer: Medicare Other | Admitting: Obstetrics & Gynecology

## 2021-12-16 ENCOUNTER — Encounter: Payer: Self-pay | Admitting: Obstetrics & Gynecology

## 2021-12-16 ENCOUNTER — Other Ambulatory Visit: Payer: Self-pay

## 2021-12-16 VITALS — BP 138/61 | HR 58 | Ht 68.0 in | Wt 278.0 lb

## 2021-12-16 DIAGNOSIS — N8110 Cystocele, unspecified: Secondary | ICD-10-CM

## 2021-12-16 DIAGNOSIS — Z1231 Encounter for screening mammogram for malignant neoplasm of breast: Secondary | ICD-10-CM

## 2021-12-16 NOTE — Progress Notes (Signed)
Pt c/o bladder prolapse Last mammogram-07/22/20- abnormal pt had f/u u/s and it was normal

## 2021-12-21 ENCOUNTER — Encounter: Payer: Medicare Other | Admitting: Student in an Organized Health Care Education/Training Program

## 2021-12-23 DIAGNOSIS — Z20822 Contact with and (suspected) exposure to covid-19: Secondary | ICD-10-CM | POA: Diagnosis not present

## 2021-12-31 ENCOUNTER — Ambulatory Visit (INDEPENDENT_AMBULATORY_CARE_PROVIDER_SITE_OTHER): Payer: Medicare Other | Admitting: Obstetrics and Gynecology

## 2021-12-31 ENCOUNTER — Encounter: Payer: Self-pay | Admitting: Obstetrics and Gynecology

## 2021-12-31 ENCOUNTER — Other Ambulatory Visit: Payer: Self-pay

## 2021-12-31 ENCOUNTER — Other Ambulatory Visit (HOSPITAL_COMMUNITY)
Admission: RE | Admit: 2021-12-31 | Discharge: 2021-12-31 | Disposition: A | Discharge status: Home / Self Care | Payer: Medicare Other | Source: Ambulatory Visit | Attending: Obstetrics and Gynecology | Admitting: Obstetrics and Gynecology

## 2021-12-31 VITALS — BP 150/86 | HR 48 | Ht 68.0 in | Wt 278.0 lb

## 2021-12-31 DIAGNOSIS — N993 Prolapse of vaginal vault after hysterectomy: Secondary | ICD-10-CM | POA: Diagnosis not present

## 2021-12-31 DIAGNOSIS — N8110 Cystocele, unspecified: Secondary | ICD-10-CM | POA: Diagnosis not present

## 2021-12-31 DIAGNOSIS — R350 Frequency of micturition: Secondary | ICD-10-CM | POA: Insufficient documentation

## 2021-12-31 DIAGNOSIS — R3129 Other microscopic hematuria: Secondary | ICD-10-CM | POA: Insufficient documentation

## 2021-12-31 LAB — URINALYSIS, ROUTINE W REFLEX MICROSCOPIC
Bacteria, UA: NONE SEEN
Bilirubin Urine: NEGATIVE
Glucose, UA: NEGATIVE mg/dL
Ketones, ur: NEGATIVE mg/dL
Leukocytes,Ua: NEGATIVE
Nitrite: NEGATIVE
Protein, ur: 30 mg/dL — AB
Specific Gravity, Urine: 1.021 (ref 1.005–1.030)
pH: 6 (ref 5.0–8.0)

## 2021-12-31 LAB — POCT URINALYSIS DIPSTICK
Appearance: NORMAL
Bilirubin, UA: NEGATIVE
Glucose, UA: NEGATIVE
Ketones, UA: NEGATIVE
Leukocytes, UA: NEGATIVE
Nitrite, UA: NEGATIVE
Protein, UA: NEGATIVE
Spec Grav, UA: 1.025 (ref 1.010–1.025)
Urobilinogen, UA: 0.2 E.U./dL
pH, UA: 6 (ref 5.0–8.0)
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
## 2021-12-31 NOTE — Patient Instructions (Signed)
URODYNAMICS (UDS) TEST INFORMATION  IMPORTANT: Please try to arrive with a comfortably full bladder!    What is UDS? Urodynamics is a bladder test used to evaluate how your bladder and urethra (tube you urinate out of) work to help find out the cause of your bladder symptoms and evaluate your bladder function in order to make the best treatment plan for you.   What to expect? A nurse will perform the test and will be with you during the entire exam. First we will have to empty your bladder on a special toilet.  After you have emptied your bladder, very small catheters (plastic tubing) will be placed into your bladder and into your vagina (or rectum). These special small catheters measure pressure to help measure your bladder function.  Your bladder will be gently filled with water and you will be asked to cough and strain at several different points during the test.   You will then be asked to empty your bladder in the special toilet with the catheters in place. Most patients can urinate (pee) easily with the catheters in place since the catheters are so small. In total this procedure lasts about 45 minutes to 1 hour.  After your test is completed, you will return (or possibly be seen the same day) to review the results, talk about treatment options and make a plan moving forward.  

## 2022-01-02 LAB — URINE CULTURE: Culture: <10000 — AB

## 2022-01-04 ENCOUNTER — Encounter: Payer: Medicare Other | Admitting: Student in an Organized Health Care Education/Training Program

## 2022-01-05 ENCOUNTER — Ambulatory Visit: Payer: Medicare Other

## 2022-01-05 ENCOUNTER — Other Ambulatory Visit: Payer: Self-pay

## 2022-01-05 ENCOUNTER — Ambulatory Visit
Admission: RE | Admit: 2022-01-05 | Discharge: 2022-01-05 | Disposition: A | Discharge status: Home / Self Care | Payer: Medicare Other | Source: Ambulatory Visit | Attending: Obstetrics & Gynecology | Admitting: Obstetrics & Gynecology

## 2022-01-05 DIAGNOSIS — Z1231 Encounter for screening mammogram for malignant neoplasm of breast: Secondary | ICD-10-CM | POA: Diagnosis not present

## 2022-01-07 ENCOUNTER — Encounter: Payer: Self-pay | Admitting: Obstetrics & Gynecology

## 2022-01-07 NOTE — Progress Notes (Signed)
History:  66 y.o. G2P2002 here today for eval of bulge from vagina. Pt reports that she was previously having leakage of urine which seems to have resolved. She is s/p hysterectomy by me in 2015. Pt denies any other GYN issues. Pt reports that she has lost >50#s on purpose. She has also had a reduction mammoplasty since I last saw her and she reports that she feel much better.      The following portions of the patient's history were reviewed and updated as appropriate: allergies, current medications, past family history, past medical history, past social history, past surgical history and problem list.  Review of Systems:  Pertinent items are noted in HPI.    Objective:  Physical Exam Blood pressure 138/61, pulse (!) 58, height 5\' 8"  (1.727 m), weight 278 lb (126.1 kg), last menstrual period 01/13/2014.  CONSTITUTIONAL: Well-developed, well-nourished female in no acute distress.  HENT:  Normocephalic, atraumatic EYES: Conjunctivae and EOM are normal. No scleral icterus.  NECK: Normal range of motion SKIN: Skin is warm and dry. No rash noted. Not diaphoretic.No pallor. [place]: Alert and oriented to person, place, and time. Normal coordination.  Pelvic: Normal appearing external genitalia; The vagina cuff and bladder are prolapsed through the introitus with valsalva. There does to appear to be a significant rectocele. normal appearing vaginal mucosa.  Normal discharge.  Uterus and cervix surgically absent. No other palpable masses, no uterine or adnexal tenderness  Labs and Imaging MM 3D SCREEN BREAST BILATERAL  Result Date: 01/05/2022 CLINICAL DATA:  Screening. EXAM: DIGITAL SCREENING BILATERAL MAMMOGRAM WITH TOMOSYNTHESIS AND CAD TECHNIQUE: Bilateral screening digital craniocaudal and mediolateral oblique mammograms were obtained. Bilateral screening digital breast tomosynthesis was performed. The images were evaluated with computer-aided detection. COMPARISON:  Previous exam(s). ACR  Breast Density Category b: There are scattered areas of fibroglandular density. FINDINGS: There are no findings suspicious for malignancy. IMPRESSION: No mammographic evidence of malignancy. A result letter of this screening mammogram will be mailed directly to the patient. RECOMMENDATION: Screening mammogram in one year. (Code:SM-B-01Y) BI-RADS CATEGORY  1: Negative. Electronically Signed   By: Fidela Salisbury M.D.   On: 01/05/2022 16:55    Assessment & Plan:  Shiza was seen today for bladder prolapse.  Diagnoses and all orders for this visit:  Encounter for screening mammogram for malignant neoplasm of breast -     Cancel: MM Digital Screening; Future -     MM 3D SCREEN BREAST BILATERAL; Future  Female bladder prolapse -     Ambulatory referral to Urogynecology  Pt would like to consider surgical management.   Total face-to-face time with patient, review of chart, discussion with consultant and coordination of care was 6min.    Shi Grose L. Harraway-Smith, M.D., Cherlynn June

## 2022-01-11 ENCOUNTER — Encounter: Payer: Self-pay | Admitting: Student in an Organized Health Care Education/Training Program

## 2022-01-11 ENCOUNTER — Ambulatory Visit (INDEPENDENT_AMBULATORY_CARE_PROVIDER_SITE_OTHER): Payer: Medicare Other | Admitting: Student in an Organized Health Care Education/Training Program

## 2022-01-11 VITALS — BP 160/72 | HR 60 | Temp 97.7°F | Ht 68.0 in | Wt 282.6 lb

## 2022-01-11 DIAGNOSIS — M7582 Other shoulder lesions, left shoulder: Secondary | ICD-10-CM

## 2022-01-11 DIAGNOSIS — M7581 Other shoulder lesions, right shoulder: Secondary | ICD-10-CM | POA: Diagnosis not present

## 2022-01-11 DIAGNOSIS — E785 Hyperlipidemia, unspecified: Secondary | ICD-10-CM | POA: Diagnosis not present

## 2022-01-11 NOTE — Progress Notes (Signed)
Assessment and Plan:  See Encounters tab for problem-based medical decision making.   __________________________________________________________  HPI:  66 year old person here for acute visit of progressive pain in both of her shoulders.  I saw the patient for this last fall as well, at that time the pain was not function limiting.  She has had diffuse issues with osteoarthritis related to complications of obesity, but mostly affecting her knees in the past.  After last visit her shoulder pain improved with just supportive care for several months.  However over the last few weeks she has had worsening pain in both shoulders, bothers her mostly at night.  She does have significant morning stiffness for about an hour each day.  Unable to abduct her left arm above 90 degrees which is starting to inhibit her activities of daily living.  Denies any fevers or chills.  Denies any headaches or changes in her vision, she does keep following up with Dr. Katy Fitch.  She was ruled out for giant cell arteritis last summer with a reassuring temporal artery biopsy, she was also intolerant of oral steroids at low doses at that time.  __________________________________________________________  Problem List: Patient Active Problem List   Diagnosis Date Noted   Atherosclerosis of aorta ([place]) 06/20/2015    Priority: High   Coronary atherosclerosis of native coronary artery 01/05/2014    Priority: High   Osteoarthritis of right knee 11/07/2014    Priority: Medium    Morbid obesity due to excess calories ([place]) 05/10/2012    Priority: Medium    Obstructive sleep apnea 03/27/2010    Priority: Medium    Essential hypertension 06/30/2007    Priority: Medium    Tendinitis of both rotator cuffs 09/07/2021    Priority: Low   Keloid scar of skin 09/07/2021    Priority: Low   Pes planus of left foot 09/08/2020    Priority: Low   Hallux valgus 09/08/2020    Priority: Low   Glucose intolerance (impaired glucose  tolerance) 07/14/2020    Priority: Low   Vitamin D deficiency 07/14/2020    Priority: Low   Depression 10/16/2018    Priority: Low   Osteoarthritis of left shoulder 10/24/2017    Priority: Low   Peripheral neuropathy 08/19/2017    Priority: [place] care maintenance 10/09/2014    Priority: Low   GERD 08/06/2008    Priority: Low   Hyperlipidemia 10/31/2007    Priority: Low    Medications: Reconciled today in Epic __________________________________________________________  Physical Exam:  Vital Signs: Vitals:   01/11/22 0845  BP: (!) 160/72  Pulse: 60  Temp: 97.7 F (36.5 C)  TempSrc: Oral  SpO2: 100%  Weight: 282 lb 9.6 oz (128.2 kg)  Height: 5\' 8"  (1.727 m)    Gen: Well appearing, NAD Ext: Left shoulder can abduct to about 80 degrees.  No warmth or swelling at either shoulder.  No obvious deformity.  He has good strength with all range of motion but she does have pain with external rotation and abduction on the left, and internal rotation on the right. Skin: No atypical appearing moles. No rashes  On point-of-care ultrasound of the left shoulder, there is some slight swelling of the subacromial bursa, looks to be some calcifications on the humeral head posteriorly without clear joint effusion.  Right shoulder shows about a 2 cm target sign around the anterior humeral head at the long head of the biceps tendon which is also tender consistent with biceps tendinitis.  No joint effusion of the right posterior shoulder.

## 2022-01-11 NOTE — Assessment & Plan Note (Signed)
Patient with several week history of bilateral shoulder pain, positive morning stiffness, this seems consistent with either polymyalgia rheumatica or a bilateral rotator cuff tendinitis.  We worked the patient up last summer for possible giant cell arteritis based on elevated inflammatory markers and some changes in her vision.  Temporal artery biopsy was reassuring with no signs of arteritis.  During that time the patient was empirically treated with prednisone but unable to tolerate even low dose due to side effects of jitteriness.  Shoulder pain last fall went away on its own, but the last few weeks it has flared up again and become more function limiting.  On my exam she has impingement of the left shoulder, no signs of effusion.  On the right shoulder on ultrasound I noted a very large biceps tendinitis.  The symptoms of the left shoulder are more function limiting than the right.  I offered the patient low-dose steroids to empirically treat for PMR, however she declined and says that she could not tolerate even a low-dose again.  Instead we decided to try a steroid injection into the subacromial space of the left shoulder to treat for possible rotator cuff tendinitis and monitor her response.  I will also check inflammatory markers again, and rule out rheumatoid arthritis with an RF and anti-CCP.  If she does not respond to the steroid injection we may choose to refer her to rheumatology to consider steroid sparing treatment of PMR.  Shoulder Injection Procedure Note  Pre-operative Diagnosis: left rotator cuff tendinitis  Indications: Unexplained monoarthritis  Anesthesia: not required   Procedure Details   Point of care ultrasound was used to identify the subacromial bursa and evaluate for rotator cuff tendinopathy or tears. Consent was obtained for the procedure. The shoulder was prepped with iodine. Using a 22 gauge needle the subacromial bursa was injected with 2 mL 1% lidocaine and 40 mg of  triamcinolone (KENALOG) 40mg /ml. The needle was removed and a dressing was applied.  Complications:  None; patient tolerated the procedure well.

## 2022-01-11 NOTE — Assessment & Plan Note (Signed)
History of ischemic heart disease treated with surgical bypass.  Patient has intermittently used statins over the years but currently not using atorvastatin because of diffuse myalgias.  We will check lipids today if elevated recommend restarting statin medication.  Goal LDL less than 70.

## 2022-01-12 ENCOUNTER — Telehealth: Payer: Self-pay | Admitting: Student in an Organized Health Care Education/Training Program

## 2022-01-12 DIAGNOSIS — M2550 Pain in unspecified joint: Secondary | ICD-10-CM

## 2022-01-12 MED ORDER — ROSUVASTATIN CALCIUM 10 MG PO TABS
10.0000 mg | ORAL_TABLET | Freq: Every day | ORAL | 3 refills | Status: DC
Start: 2022-01-12 — End: 2022-11-03

## 2022-01-12 NOTE — Telephone Encounter (Signed)
I spoke with the patient over the phone about labs from yesterday. We did a steroid injection to her left subacromial space and she reports good benefit, no shoulder pain this morning. Labs notable for normal inflammatory markers, which makes PMR less likely. There is a positive rheumatoid factor. Given her history of polyarthralgias and a history of possible GCA that we could not confirm on biopsy, will refer her for consultation with rheumatology at this point. Question is to rule in or out rheumatoid arthritis. Also will need to make a strategy to treat her long term without prednisone which she had a very poor reaction to.

## 2022-01-13 LAB — CBC

## 2022-01-15 LAB — BMP8+ANION GAP
Anion Gap: 13 mmol/L (ref 10.0–18.0)
BUN/Creatinine Ratio: 11 — ABNORMAL LOW (ref 12–28)
BUN: 9 mg/dL (ref 8–27)
CO2: 25 mmol/L (ref 20–29)
Calcium: 9.6 mg/dL (ref 8.7–10.3)
Chloride: 100 mmol/L (ref 96–106)
Creatinine, Ser: 0.8 mg/dL (ref 0.57–1.00)
Glucose: 101 mg/dL — ABNORMAL HIGH (ref 70–99)
Potassium: 4.3 mmol/L (ref 3.5–5.2)
Sodium: 138 mmol/L (ref 134–144)
eGFR: 81 mL/min/{1.73_m2} (ref >59)

## 2022-01-15 LAB — LIPID PANEL
Chol/HDL Ratio: 4 ratio (ref 0.0–4.4)
Cholesterol, Total: 202 mg/dL — ABNORMAL HIGH (ref 100–199)
HDL: 51 mg/dL (ref >39)
LDL Chol Calc (NIH): 133 mg/dL — ABNORMAL HIGH (ref 0–99)
Triglycerides: 100 mg/dL (ref 0–149)
VLDL Cholesterol Cal: 18 mg/dL (ref 5–40)

## 2022-01-15 LAB — CBC
Hematocrit: 42.8 % (ref 34.0–46.6)
Hemoglobin: 13.6 g/dL (ref 11.1–15.9)
MCH: 28 pg (ref 26.6–33.0)
MCHC: 31.8 g/dL (ref 31.5–35.7)
MCV: 88 fL (ref 79–97)
Platelets: 234 10*3/uL (ref 150–450)
RBC: 4.86 x10E6/uL (ref 3.77–5.28)
RDW: 14.6 % (ref 11.7–15.4)
WBC: 5.3 10*3/uL (ref 3.4–10.8)

## 2022-01-15 LAB — CYCLIC CITRUL PEPTIDE ANTIBODY, IGG/IGA: Cyclic Citrullin Peptide Ab: 4 units (ref 0–19)

## 2022-01-15 LAB — SEDIMENTATION RATE: Sed Rate: 11 mm/hr (ref 0–40)

## 2022-01-15 LAB — RHEUMATOID FACTOR: Rheumatoid fact SerPl-aCnc: 24.3 IU/mL — ABNORMAL HIGH (ref <14.0)

## 2022-01-15 LAB — C-REACTIVE PROTEIN: CRP: 6 mg/L (ref 0–10)

## 2022-01-24 DIAGNOSIS — I10 Essential (primary) hypertension: Secondary | ICD-10-CM | POA: Diagnosis not present

## 2022-01-24 DIAGNOSIS — I25810 Atherosclerosis of coronary artery bypass graft(s) without angina pectoris: Secondary | ICD-10-CM | POA: Diagnosis not present

## 2022-01-24 DIAGNOSIS — J302 Other seasonal allergic rhinitis: Secondary | ICD-10-CM | POA: Diagnosis not present

## 2022-01-25 NOTE — Progress Notes (Signed)
[facility] ? ? ?Subjective:  ?  ? ?Chief Complaint: Pessary fitting [name] is a 66 y.o. female here for a pessary fitting.) ? ?History of Present Illness: ?[name] is a 66 y.o. female with stage III pelvic organ prolapse who presents today for a pessary fitting.  ? ? ?Past Medical History: ?Patient  has a past medical history of Acute urinary retention s/p Foley (01/07/2012), Allergy, Anemia, Asthma, CAD (coronary artery disease), Carotid arterial disease ([place]), Carpal tunnel syndrome, bilateral, GERD (gastroesophageal reflux disease), Hemorrhoids, internal, with bleeding & prolapse (12/13/2011), Hernia, abdominal, History of blood transfusion, History of kidney stones, Hyperlipidemia, Hypertension, Iron deficiency (12-07-2011), Keloid scar, Morbid obesity ([place]), Myocardial infarct ([place]), Neuropathy, Osteoarthritis, Osteoporosis, PONV (postoperative nausea and vomiting), Seasonal allergies, Sleep apnea, and Uterine cancer ([place]) (7/15).  ? ?Past Surgical History: ?She  has a past surgical history that includes Knee surgery (Bilateral, 1999); Total knee arthroplasty (Left, 04/29/2011); Wisdom tooth extraction; Colonoscopy (2009); Knee arthroscopy (1991); transanal hemorrhoidal dearterliaization (01/06/2012); Coronary artery bypass graft (N/A, 01/09/2014); Intraoprative transesophageal echocardiogram (N/A, 01/09/2014); TEE without cardioversion (01/2010); NM MYOCAR PERF WALL MOTION (01/2010); Cardiac catheterization; Multiple extractions with alveoloplasty (N/A, 04/11/2014); Vaginal hysterectomy (N/A, 06/03/2014); Unilateral salpingectomy (Right, 06/03/2014); left heart catheterization with coronary angiogram (N/A, 01/04/2014); Breast reduction surgery (Bilateral, 10/10/2015); Colonoscopy w/ biopsies and polypectomy; Lesion excision with complex repair (N/A, 05/07/2016); Cataract extraction (Right); Artery Biopsy (Left, 07/10/2021); and bladder tack.  ? ?Medications: ?She has a current  medication list which includes the following prescription(s): acetaminophen, albuterol, aspirin ec, vitamin d high potency, diclofenac sodium, esomeprazole, losartan, metoprolol tartrate, multivitamin with minerals, nystatin cream, polyethyl glycol-propyl glycol, and rosuvastatin.  ? ?Allergies: ?Patient is allergic to cephalosporins, eicosapentaenoic acid (epa), fish-derived products, peanuts [peanut oil], shrimp [shellfish allergy], latex, metronidazole, ciprofloxacin, lisinopril, naproxen, other, and prednisone.  ? ?Social History: ?Patient  reports that she has never smoked. She has never used smokeless tobacco. She reports that she does not drink alcohol and does not use drugs.  ? ?  ? ?Objective:  ?  ?BP 139/82   Pulse (!) 56   LMP 01/13/2014 Comment: spotting in Feb and bleeding since April ?Gen: No apparent distress, A&O x 3. ?Pelvic Exam: Normal external female genitalia; Bartholin's and Skene's glands normal in appearance; urethral meatus normal in appearance, no urethral masses or discharge.  ? ?A size #5 ring with support pessary was fitted. It was comfortable, stayed in place with valsalva and was an appropriate size on examination, with one finger fitting between the pessary and the vaginal walls. The fitter was removed and replaced with a #5 (3in) shaatz pessary, Lot [id number], Exp, 11/03/25.  ? ?POP-Q (12/31/21):  ?  ?POP-Q ?  ?3  ?                                          Aa   ?3 ?                                          Ba   ?-7  ?  C  ?  ?7  ?                                          Gh   ?3.5  ?                                          Pb   ?9  ?                                          tvl  ?  ?-3  ?                                          Ap   ?-3  ?                                          Bp   ?   ?                                            D  ?  ? ? ?Assessment/Plan:  ?  ?Assessment: ?[name] is a 66 y.o. with stage III pelvic organ prolapse who  presents for a pessary fitting. ? ?Plan: ?She was fitted with a 3in  shaatz  pessary. She will keep the pessary in place until next visit.  ? ?Follow-up in 3 weeks for a pessary check or sooner as needed.  ?All questions were answered. ?  ? ?[name], MD ? ?

## 2022-01-26 ENCOUNTER — Ambulatory Visit (INDEPENDENT_AMBULATORY_CARE_PROVIDER_SITE_OTHER): Payer: Medicare Other | Admitting: Obstetrics and Gynecology

## 2022-01-26 ENCOUNTER — Other Ambulatory Visit: Payer: Self-pay

## 2022-01-26 ENCOUNTER — Encounter: Payer: Self-pay | Admitting: Obstetrics and Gynecology

## 2022-01-26 VITALS — BP 139/82 | HR 56

## 2022-01-26 DIAGNOSIS — N993 Prolapse of vaginal vault after hysterectomy: Secondary | ICD-10-CM | POA: Diagnosis not present

## 2022-01-26 DIAGNOSIS — N8110 Cystocele, unspecified: Secondary | ICD-10-CM | POA: Diagnosis not present

## 2022-02-01 ENCOUNTER — Ambulatory Visit (INDEPENDENT_AMBULATORY_CARE_PROVIDER_SITE_OTHER): Payer: Medicare Other | Admitting: Obstetrics and Gynecology

## 2022-02-01 ENCOUNTER — Other Ambulatory Visit: Payer: Self-pay

## 2022-02-01 VITALS — BP 163/79 | HR 85 | Ht 68.0 in | Wt 282.0 lb

## 2022-02-01 DIAGNOSIS — R350 Frequency of micturition: Secondary | ICD-10-CM | POA: Diagnosis not present

## 2022-02-01 DIAGNOSIS — N393 Stress incontinence (female) (male): Secondary | ICD-10-CM

## 2022-02-01 LAB — POCT URINALYSIS DIPSTICK
Appearance: NORMAL
Bilirubin, UA: NEGATIVE
Glucose, UA: NEGATIVE
Ketones, UA: NEGATIVE
Leukocytes, UA: NEGATIVE
Nitrite, UA: NEGATIVE
Protein, UA: NEGATIVE
Spec Grav, UA: 1.02 (ref 1.010–1.025)
Urobilinogen, UA: 0.2 E.U./dL
pH, UA: 6 (ref 5.0–8.0)

## 2022-02-01 NOTE — Patient Instructions (Signed)

## 2022-02-12 ENCOUNTER — Ambulatory Visit: Payer: Medicare Other | Admitting: Obstetrics and Gynecology

## 2022-02-12 ENCOUNTER — Ambulatory Visit (INDEPENDENT_AMBULATORY_CARE_PROVIDER_SITE_OTHER): Payer: Medicare Other | Admitting: Obstetrics and Gynecology

## 2022-02-12 ENCOUNTER — Encounter: Payer: Self-pay | Admitting: Obstetrics and Gynecology

## 2022-02-12 ENCOUNTER — Other Ambulatory Visit: Payer: Self-pay

## 2022-02-12 VITALS — BP 139/70 | HR 70

## 2022-02-12 DIAGNOSIS — N8110 Cystocele, unspecified: Secondary | ICD-10-CM | POA: Diagnosis not present

## 2022-02-12 DIAGNOSIS — N393 Stress incontinence (female) (male): Secondary | ICD-10-CM | POA: Diagnosis not present

## 2022-02-12 DIAGNOSIS — N993 Prolapse of vaginal vault after hysterectomy: Secondary | ICD-10-CM | POA: Diagnosis not present

## 2022-02-12 NOTE — Progress Notes (Signed)
[facility] ?Return Visit ? ?SUBJECTIVE  ?History of Present Illness: ?[name] is a 66 y.o. female seen in follow-up for prolapse and incontinence. She underwent urodynamic testing.  ? ?The urodynamics was completed but study did not completely save so tracings are not available. She did however demonstrate stress incontinence with prolapse reduction. She also had a normal PVR with prolapse reduction.  ? ?Did not bring the pessary today. [place] out with bowel movement. Overall she has felt comfortable placing and removing pessary as needed. Has been removing at least once a week, usually more often.  ? ? ?Past Medical History: ?Patient  has a past medical history of Acute urinary retention s/p Foley (01/07/2012), Allergy, Anemia, Asthma, CAD (coronary artery disease), Carotid arterial disease ([place]), Carpal tunnel syndrome, bilateral, GERD (gastroesophageal reflux disease), Hemorrhoids, internal, with bleeding & prolapse (12/13/2011), Hernia, abdominal, History of blood transfusion, History of kidney stones, Hyperlipidemia, Hypertension, Iron deficiency (12-07-2011), Keloid scar, Morbid obesity ([place]), Myocardial infarct ([place]), Neuropathy, Osteoarthritis, Osteoporosis, PONV (postoperative nausea and vomiting), Seasonal allergies, Sleep apnea, and Uterine cancer ([place]) (7/15).  ? ?Past Surgical History: ?She  has a past surgical history that includes Knee surgery (Bilateral, 1999); Total knee arthroplasty (Left, 04/29/2011); Wisdom tooth extraction; Colonoscopy (2009); Knee arthroscopy (1991); transanal hemorrhoidal dearterliaization (01/06/2012); Coronary artery bypass graft (N/A, 01/09/2014); Intraoprative transesophageal echocardiogram (N/A, 01/09/2014); TEE without cardioversion (01/2010); NM MYOCAR PERF WALL MOTION (01/2010); Cardiac catheterization; Multiple extractions with alveoloplasty (N/A, 04/11/2014); Vaginal hysterectomy (N/A, 06/03/2014); Unilateral salpingectomy (Right, 06/03/2014); left  heart catheterization with coronary angiogram (N/A, 01/04/2014); Breast reduction surgery (Bilateral, 10/10/2015); Colonoscopy w/ biopsies and polypectomy; Lesion excision with complex repair (N/A, 05/07/2016); Cataract extraction (Right); Artery Biopsy (Left, 07/10/2021); and bladder tack.  ? ?Medications: ?She has a current medication list which includes the following prescription(s): acetaminophen, albuterol, aspirin ec, vitamin d high potency, diclofenac sodium, esomeprazole, losartan, metoprolol tartrate, multivitamin with minerals, nystatin cream, polyethyl glycol-propyl glycol, and rosuvastatin.  ? ?Allergies: ?Patient is allergic to cephalosporins, eicosapentaenoic acid (epa), fish-derived products, peanuts [peanut oil], shrimp [shellfish allergy], latex, metronidazole, ciprofloxacin, lisinopril, naproxen, other, and prednisone.  ? ?Social History: ?Patient  reports that she has never smoked. She has never used smokeless tobacco. She reports that she does not drink alcohol and does not use drugs.  ?  ?  ?OBJECTIVE  ?  ? ?Physical Exam: ?Vitals:  ? 02/12/22 1405  ?BP: 139/70  ?Pulse: 70  ? ?Gen: No apparent distress, A&O x 3. ? ?Detailed Urogynecologic Evaluation:  ?Deferred. Prior exam showed: ?POP-Q:  ?  ?POP-Q ?  ?3  ?                                          Aa   ?3 ?                                          Ba   ?-7  ?                                            C  ?  ?7  ?  Gh   ?3.5  ?                                          Pb   ?9  ?                                          tvl  ?  ?-3  ?                                          Ap   ?-3  ?                                          Bp   ?   ?                                            D  ?  ?   ? ?ASSESSMENT AND PLAN  ?  ?[name] is a 66 y.o. with:  ?1. Vaginal vault prolapse after hysterectomy   ?2. Prolapse of anterior vaginal wall   ?3. SUI (stress urinary incontinence, female)   ? ?- She feels that the  prolapse is well managed with the pessary at this time and she is comfortable with management on her own. She may want surgery in the future (plan would be for Anterior repair, SSLF, sling).  ?- Will have her return in 2 months for a pessary check.  ? ?Tracy Folds, MD ? ?Time spent: I spent 20 minutes dedicated to the care of this patient on the date of this encounter to include pre-visit review of records, face-to-face time with the patient and post visit documentation . ? ?

## 2022-02-15 ENCOUNTER — Encounter: Payer: Self-pay | Admitting: Obstetrics and Gynecology

## 2022-02-15 ENCOUNTER — Other Ambulatory Visit: Payer: Self-pay | Admitting: Student in an Organized Health Care Education/Training Program

## 2022-02-16 NOTE — Progress Notes (Signed)
[facility] ?Urodynamics Procedure ? ?Referring Physician: Axel Filler,* ?Date of Procedure: 02/01/2022 ? ?[name] is a 66 y.o. female who presents for urodynamic evaluation. Indication(s) for study: occult SUI ? ?Vital Signs: ?BP (!) 163/79   Pulse 85   Ht '5\' 8"'$  (1.727 m)   Wt 282 lb (127.9 kg)   LMP 01/13/2014 Comment: spotting in Feb and bleeding since April  BMI 42.88 kg/m?  ? ?Laboratory Results: ?A catheterized urine specimen revealed: ? ?POC urine: negative ? ? ?Voiding Diary: ?Not performed ? ?Procedure Timeout: ? The correct patient was verified and the correct procedure was verified. The patient was in the correct position and safety precautions were reviewed based on at the patient's history. ? ?**Urodynamic study did not save and the majority of data and tracings from the study were lost** ? ?Urodynamic Procedure ?A 40F dual lumen urodynamics catheter was placed under sterile conditions into the patient's bladder. A 40F catheter was placed into the rectum in order to measure abdominal pressure. EMG patches were placed in the appropriate position.  All connections were confirmed and calibrations/adjusted made. Saline was instilled into the bladder through the dual lumen catheters.  Cough/valsalva pressures were measured periodically during filling.  Patient was allowed to void.  The bladder was then emptied of its residual. ? ?UROFLOW: ?Revealed a Qmax of 2.5 mL/sec.  She voided 35.6 mL and had a residual of 70 mL.  It was a interrupted pattern and represented normal habits though interpretation limited due to low voided volume. ? ?CMG: ?This was performed with sterile water in the sitting position at a fill rate of 30 mL/min.   ? ?First sensation of fullness was 85 mLs,  ?First urge was 130 mLs,  ?Strong urge was 190 mLs and  ?Capacity was 440 mLs ? ?Stress incontinence was demonstrated with valsalva in the standing position at capacity.  ? ?Unable to comment on detrusor  activity due to lost tracing. ? ? ?MICTURITION STUDY: ?Voiding was performed with reduction using scopettes in the sitting position.   She voided 455 mL and had a residual of 15 mL.   ? ?EMG: ?Unable to comment due to lost tracing.  ? ?The details of the procedure with the study tracings have been scanned into EPIC.  ? ?Urodynamic Impression:  ?1. Sensation was normal; capacity was normal ?2. Stress Incontinence was demonstrated. ?3.  Emptying was normal with a normal PVR.(52m with reduction of prolapse) ? ?Plan: ?- The patient will follow up  to discuss the findings and treatment options.  ? ?

## 2022-03-07 ENCOUNTER — Other Ambulatory Visit: Payer: Self-pay | Admitting: Internal Medicine

## 2022-03-09 MED ORDER — METOPROLOL TARTRATE 25 MG PO TABS: ORAL_TABLET | ORAL | 1 refills | Status: DC

## 2022-03-09 NOTE — Addendum Note (Signed)
Addended by: Gean Birchwood on: 03/09/2022 09:46 AM ? ? Modules accepted: Orders ? ?

## 2022-03-22 ENCOUNTER — Encounter: Payer: Medicare Other | Admitting: Student in an Organized Health Care Education/Training Program

## 2022-03-24 ENCOUNTER — Encounter: Payer: Self-pay | Admitting: Student in an Organized Health Care Education/Training Program

## 2022-03-28 NOTE — Progress Notes (Signed)
? ?Office Visit Note ? ?Patient: [name]             ?Date of Birth: 11/30/1955           ?MRN: [id number]             ?PCP: Axel Filler, MD ?Referring: Axel Filler,* ?Visit Date: 03/31/2022 ? ?Subjective:  ?New Patient (Initial Visit) (Total body joint pain, Biopsy) ? ? ?History of Present Illness: [name] is a 66 y.o. female here for evaluation of chronic joint pains especially bilateral shoulder pain with significant morning stiffness and previous concern for PMR with ultrasound findings consistent with rotator cuff tendonitis. She has longstanding knee and lumbar spine osteoarthritis which limits mobility. Previous knee aspiration with non inflammatory synovial fluid analysis 2 and 3 years ago. She had concern for GCA due to onset of headaches and highly elevated sedimentation rate test last year, but negative TA biopsy was obtained with Dr. Katy Fitch. She did not take recommended prednisone course due to side effects. Shoulder pain and also decreased hip flexion mobility ongoing symptoms despite resolution of headache complaints from last year.  More recent inflammatory markers in February or decreased to normal but had low positive rheumatoid factor. ? ?Labs reviewed ?12/2021 ?RF 24.3 ?CCP neg ?ESR 11 ?CRP 6 ? ?06/2021 ?ESR 100 ? ?08/2017 HCV neg ? ?Activities of Daily Living:  ?Patient reports morning stiffness for 10 minutes.   ?Patient Denies nocturnal pain.  ?Difficulty dressing/grooming: Denies ?Difficulty climbing stairs: Reports ?Difficulty getting out of chair: Reports ?Difficulty using hands for taps, buttons, cutlery, and/or writing: Reports ? ?Review of Systems  ?Constitutional:  Positive for fatigue.  ?HENT:  Positive for mouth dryness.   ?Eyes:  Positive for dryness.  ?Respiratory:  Negative for shortness of breath.   ?Cardiovascular:  Positive for swelling in legs/feet.  ?Gastrointestinal:  Negative for constipation.  ?Endocrine: Positive for heat intolerance and  increased urination.  ?Genitourinary:  Negative for difficulty urinating.  ?Musculoskeletal:  Positive for joint pain, joint pain, joint swelling, muscle weakness, morning stiffness and muscle tenderness.  ?Skin:  Negative for rash.  ?Allergic/Immunologic: Negative for susceptible to infections.  ?Neurological:  Positive for numbness.  ?Hematological:  Positive for bruising/bleeding tendency.  ?Psychiatric/Behavioral:  Negative for sleep disturbance.   ? ?PMFS History:  ?Patient Active Problem List  ? Diagnosis Date Noted  ? Polyarthritis with positive rheumatoid factor ([place]) 03/31/2022  ? High risk medication use 03/31/2022  ? Tendinitis of both rotator cuffs 09/07/2021  ? Keloid scar of skin 09/07/2021  ? Pes planus of left foot 09/08/2020  ? Hallux valgus 09/08/2020  ? Glucose intolerance (impaired glucose tolerance) 07/14/2020  ? Vitamin D deficiency 07/14/2020  ? Depression 10/16/2018  ? Osteoarthritis of left shoulder 10/24/2017  ? Peripheral neuropathy 08/19/2017  ? Atherosclerosis of aorta ([place]) 06/20/2015  ? Osteoarthritis of right knee 11/07/2014  ? Health care maintenance 10/09/2014  ? Coronary atherosclerosis of native coronary artery 01/05/2014  ? Morbid obesity due to excess calories ([place]) 05/10/2012  ? Obstructive sleep apnea 03/27/2010  ? GERD 08/06/2008  ? Hyperlipidemia 10/31/2007  ? Essential hypertension 06/30/2007  ?  ?Past Medical History:  ?Diagnosis Date  ? Acute urinary retention s/p Foley 01/07/2012  ? Allergy   ? Anemia   ? Asthma   ? CAD (coronary artery disease)   ? a. 12/2013 s/p CABG x 5 Dr. Darcey Nora (LIMA to LAD, SVG to diagonal, SVG to OM1, SVG to OM2, SVG to PDA)  ?  Carotid arterial disease ([place])   ? a. 12/2013 Carotid U/S: 1-39% bilat ICA stenosis.  ? Carpal tunnel syndrome, bilateral   ? GERD (gastroesophageal reflux disease)   ? Hemorrhoids, internal, with bleeding & prolapse 12/13/2011  ? Hernia, abdominal   ? History of blood transfusion   ? CABG and hysterectomy  ? History of  kidney stones   ? Hyperlipidemia   ? Hypertension   ? Iron deficiency 12-07-2011  ? Keloid scar   ? a. Sternal Keloid s/p CABG with ongoing pain.  ? Morbid obesity ([place])   ? Myocardial infarct [facility])   ? 2015  ? Neuropathy   ? Osteoarthritis   ? Osteoporosis   ? PONV (postoperative nausea and vomiting)   ? Seasonal allergies   ? Sleep apnea   ? no CPAP machine; sleep study 02/2010 REM AHI 61.7/hr, total sleep REM 14.8/hr  ? Uterine cancer ([place]) 7/15  ? clinical stage IA grade 1 endometrioid endometrial cancer  ?  ?Family History  ?Problem Relation Age of Onset  ? Heart attack Mother 90  ? Heart disease Mother   ? Hypertension Mother   ? Diabetes Mother   ? Hypertension Sister   ? Heart failure Sister   ? Heart disease Sister   ? Diabetes Sister   ? Kidney disease Sister   ? Kidney disease Brother   ? Hypertension Daughter   ? Bipolar disorder Son   ? Schizophrenia Son   ? Anxiety disorder Son   ? Esophageal cancer Other   ?     nephew  ? Colon cancer Neg Hx   ? Pancreatic cancer Neg Hx   ? Stomach cancer Neg Hx   ? Liver disease Neg Hx   ? Rectal cancer Neg Hx   ? ?Past Surgical History:  ?Procedure Laterality Date  ? ARTERY BIOPSY Left 07/10/2021  ? Procedure: LEFT TEMPORAL ARTERY BIOPSY;  Surgeon: Serafina Mitchell, MD;  Location: [facility] OR;  Service: Vascular;  Laterality: Left;  ? bladder tack    ? BREAST REDUCTION SURGERY Bilateral 10/10/2015  ? Procedure: BILATERAL MAMMARY REDUCTION  (BREAST) WITH FREE NIPPLE GRAFT;  Surgeon: Irene Limbo, MD;  Location: [facility];  Service: Plastics;  Laterality: Bilateral;  ? CARDIAC CATHETERIZATION    ? CATARACT EXTRACTION Right   ? COLONOSCOPY  2009  ? COLONOSCOPY W/ BIOPSIES AND POLYPECTOMY    ? CORONARY ARTERY BYPASS GRAFT N/A 01/09/2014  ? Procedure: CORONARY ARTERY BYPASS GRAFTING (CABG) x 5 using left internal mammary artery and right leg greater saphenous vein harvested endoscopically;  Surgeon: Ivin Poot, MD;  Location: [facility];  Service: Open Heart Surgery;   Laterality: N/A;  please use bed extenders and breast binder  ? INTRAOPERATIVE TRANSESOPHAGEAL ECHOCARDIOGRAM N/A 01/09/2014  ? Procedure: INTRAOPERATIVE TRANSESOPHAGEAL ECHOCARDIOGRAM;  Surgeon: Ivin Poot, MD;  Location: [facility];  Service: Open Heart Surgery;  Laterality: N/A;  ? KNEE ARTHROSCOPY  1991  ? KNEE SURGERY Bilateral 1999  ? LEFT HEART CATHETERIZATION WITH CORONARY ANGIOGRAM N/A 01/04/2014  ? Procedure: LEFT HEART CATHETERIZATION WITH CORONARY ANGIOGRAM;  Surgeon: Sinclair Grooms, MD;  Location: [facility] CATH LAB;  Service: Cardiovascular;  Laterality: N/A;  ? LESION EXCISION WITH COMPLEX REPAIR N/A 05/07/2016  ? Procedure: COMPLEX REPAIR OF CHEST 16 CM;  Surgeon: Irene Limbo, MD;  Location: [facility];  Service: Plastics;  Laterality: N/A;  ? MULTIPLE EXTRACTIONS WITH ALVEOLOPLASTY N/A 04/11/2014  ? Procedure: Extraction of tooth #'s 1,2,8,16 with alveoloplasty, maxillary tuberosity reductions,  and gross debridement of remaining teeth.;  Surgeon: Lenn Cal, DDS;  Location: [employer] [facility];  Service: Oral Surgery;  Laterality: N/A;  ? NM MYOCAR PERF WALL MOTION  01/2010  ? dipyridamole myoview - moderate perfusion defect in basal inferoseptal, basal inferior, mid inferoseptal, mid inferior, apical inferior region; EF 56%  ? TEE WITHOUT CARDIOVERSION  01/2010  ? EF 60-65%, small, flat, non-infiltrating, calcified, fixed apical/septal mass  ? TOTAL KNEE ARTHROPLASTY Left 04/29/2011  ? transanal hemorrhoidal dearterliaization  01/06/2012  ? with external hemorrhoid removal  ? UNILATERAL SALPINGECTOMY Right 06/03/2014  ? Procedure: UNILATERAL SALPINGECTOMY;  Surgeon: Lavonia Drafts, MD;  Location: [facility];  Service: Gynecology;  Laterality: Right;  ? VAGINAL HYSTERECTOMY N/A 06/03/2014  ? Procedure: HYSTERECTOMY VAGINAL;  Surgeon: Lavonia Drafts, MD;  Location: [facility];  Service: Gynecology;  Laterality: N/A;  ? WISDOM TOOTH EXTRACTION    ? ?Social History  ? ?Social History Narrative  ? Current  Social History 08/27/2020    ?   ? Patient lives alone in a handicap-accessible ground floor apartment which is 1 story. There are not steps up to the entrance the patient uses.   ?   ? Patient's method of transportatio

## 2022-03-31 ENCOUNTER — Ambulatory Visit (INDEPENDENT_AMBULATORY_CARE_PROVIDER_SITE_OTHER): Payer: Medicare Other

## 2022-03-31 ENCOUNTER — Encounter: Payer: Self-pay | Admitting: Internal Medicine

## 2022-03-31 ENCOUNTER — Ambulatory Visit (INDEPENDENT_AMBULATORY_CARE_PROVIDER_SITE_OTHER): Payer: Medicare Other | Admitting: Internal Medicine

## 2022-03-31 VITALS — BP 178/78 | HR 48 | Resp 17 | Ht 68.0 in | Wt 274.0 lb

## 2022-03-31 DIAGNOSIS — M7581 Other shoulder lesions, right shoulder: Secondary | ICD-10-CM | POA: Diagnosis not present

## 2022-03-31 DIAGNOSIS — M79641 Pain in right hand: Secondary | ICD-10-CM | POA: Diagnosis not present

## 2022-03-31 DIAGNOSIS — M0580 Other rheumatoid arthritis with rheumatoid factor of unspecified site: Secondary | ICD-10-CM | POA: Insufficient documentation

## 2022-03-31 DIAGNOSIS — Z79899 Other long term (current) drug therapy: Secondary | ICD-10-CM | POA: Insufficient documentation

## 2022-03-31 DIAGNOSIS — M79642 Pain in left hand: Secondary | ICD-10-CM | POA: Diagnosis not present

## 2022-03-31 DIAGNOSIS — M058A Other rheumatoid arthritis with rheumatoid factor of other specified site: Secondary | ICD-10-CM

## 2022-03-31 DIAGNOSIS — M7582 Other shoulder lesions, left shoulder: Secondary | ICD-10-CM | POA: Diagnosis not present

## 2022-04-01 LAB — SEDIMENTATION RATE: Sed Rate: 11 mm/h (ref 0–30)

## 2022-04-01 LAB — HEPATIC FUNCTION PANEL
AG Ratio: 1.2 (calc) (ref 1.0–2.5)
ALT: 8 U/L (ref 6–29)
AST: 13 U/L (ref 10–35)
Albumin: 3.9 g/dL (ref 3.6–5.1)
Alkaline phosphatase (APISO): 44 U/L (ref 37–153)
Bilirubin, Direct: 0.1 mg/dL (ref 0.0–0.2)
Globulin: 3.3 g/dL (calc) (ref 1.9–3.7)
Indirect Bilirubin: 0.5 mg/dL (calc) (ref 0.2–1.2)
Total Bilirubin: 0.6 mg/dL (ref 0.2–1.2)
Total Protein: 7.2 g/dL (ref 6.1–8.1)

## 2022-04-01 LAB — HEPATITIS B CORE ANTIBODY, IGM: Hep B C IgM: NONREACTIVE

## 2022-04-01 LAB — HEPATITIS B SURFACE ANTIGEN: Hepatitis B Surface Ag: NONREACTIVE

## 2022-04-07 NOTE — Progress Notes (Signed)
Office Visit Note  Patient: [name]             Date of Birth: 1956/11/21           MRN: [id number]             PCP: Axel Filler, MD Referring: Axel Filler,* Visit Date: 04/21/2022   Subjective:  Follow-up (Total body pain)   History of Present Illness: [name] is a 66 y.o. female here for follow up for chronic joint pains especially bilateral shoulder pain with significant morning stiffness and previous concern for PMR with ultrasound findings consistent with rotator cuff tendonitis.  Since our visit she continues having joint pains in several areas although these are not severely limiting her activity.  She has seen some swelling coming and going in the second MCP joint of her right hand.  She also keeps some amount of pain and swelling in the right knee consistently.  Lab tests at our initial visit showed normal sedimentation rate negative hepatitis screening and normal LFTs. Xrays demonstrating OA most severe in 1st CMC joints otherwise not so advanced and no erosions.  Previous HPI 03/31/2022 [name] is a 66 y.o. female here for evaluation of chronic joint pains especially bilateral shoulder pain with significant morning stiffness and previous concern for PMR with ultrasound findings consistent with rotator cuff tendonitis. She has longstanding knee and lumbar spine osteoarthritis which limits mobility. Previous knee aspiration with non inflammatory synovial fluid analysis 2 and 3 years ago. She had concern for GCA due to onset of headaches and highly elevated sedimentation rate test last year, but negative TA biopsy was obtained with Dr. Katy Fitch. She did not take recommended prednisone course due to side effects. Shoulder pain and also decreased hip flexion mobility ongoing symptoms despite resolution of headache complaints from last year.  More recent inflammatory markers in February or decreased to normal but had low positive rheumatoid factor.    Labs reviewed 12/2021 RF 24.3 CCP neg ESR 11 CRP 6   06/2021 ESR 100   08/2017 HCV neg   Review of Systems  Constitutional:  Positive for fatigue.  HENT:  Negative for mouth dryness.   Eyes:  Positive for dryness.  Respiratory:  Negative for shortness of breath.   Cardiovascular:  Positive for swelling in legs/feet.  Gastrointestinal:  Positive for constipation and diarrhea.  Endocrine: Positive for increased urination.  Genitourinary:  Negative for difficulty urinating.  Musculoskeletal:  Positive for joint pain, gait problem, joint pain, joint swelling, morning stiffness and muscle tenderness.  Skin:  Negative for rash.  Allergic/Immunologic: Negative for susceptible to infections.  Neurological:  Positive for numbness.  Hematological:  Negative for bruising/bleeding tendency.  Psychiatric/Behavioral:  Negative for sleep disturbance.    PMFS History:  Patient Active Problem List   Diagnosis Date Noted   Polyarthritis with positive rheumatoid factor ([place]) 03/31/2022   High risk medication use 03/31/2022   Tendinitis of both rotator cuffs 09/07/2021   Keloid scar of skin 09/07/2021   Pes planus of left foot 09/08/2020   Hallux valgus 09/08/2020   Glucose intolerance (impaired glucose tolerance) 07/14/2020   Vitamin D deficiency 07/14/2020   Depression 10/16/2018   Osteoarthritis of left shoulder 10/24/2017   Peripheral neuropathy 08/19/2017   Atherosclerosis of aorta ([place]) 06/20/2015   Osteoarthritis of right knee 11/07/2014   Health care maintenance 10/09/2014   Coronary atherosclerosis of native coronary artery 01/05/2014   Morbid obesity due to excess calories (  [place]) 05/10/2012   Obstructive sleep apnea 03/27/2010   GERD 08/06/2008   Hyperlipidemia 10/31/2007   Essential hypertension 06/30/2007    Past Medical History:  Diagnosis Date   Acute urinary retention s/p Foley 01/07/2012   Allergy    Anemia    Asthma    CAD (coronary artery disease)    a. 12/2013  s/p CABG x 5 Dr. Darcey Nora (LIMA to LAD, SVG to diagonal, SVG to OM1, SVG to OM2, SVG to PDA)   Carotid arterial disease ([place])    a. 12/2013 Carotid U/S: 1-39% bilat ICA stenosis.   Carpal tunnel syndrome, bilateral    GERD (gastroesophageal reflux disease)    Hemorrhoids, internal, with bleeding & prolapse 12/13/2011   Hernia, abdominal    History of blood transfusion    CABG and hysterectomy   History of kidney stones    Hyperlipidemia    Hypertension    Iron deficiency 12/07/2011   Keloid scar    a. Sternal Keloid s/p CABG with ongoing pain.   Morbid obesity ([place])    Myocardial infarct ([place])    2015   Neuropathy    Osteoarthritis    Osteoporosis    PONV (postoperative nausea and vomiting)    Seasonal allergies    Sleep apnea    no CPAP machine; sleep study 02/2010 REM AHI 61.7/hr, total sleep REM 14.8/hr   Tooth abscess    Uterine cancer ([place]) 05/2014   clinical stage IA grade 1 endometrioid endometrial cancer    Family History  Problem Relation Age of Onset   Heart attack Mother 15   Heart disease Mother    Hypertension Mother    Diabetes Mother    Hypertension Sister    Heart failure Sister    Heart disease Sister    Diabetes Sister    Kidney disease Sister    Kidney disease Brother    Hypertension Daughter    Bipolar disorder Son    Schizophrenia Son    Anxiety disorder Son    Esophageal cancer Other        nephew   Colon cancer Neg Hx    Pancreatic cancer Neg Hx    Stomach cancer Neg Hx    Liver disease Neg Hx    Rectal cancer Neg Hx    Past Surgical History:  Procedure Laterality Date   ARTERY BIOPSY Left 07/10/2021   Procedure: LEFT TEMPORAL ARTERY BIOPSY;  Surgeon: Serafina Mitchell, MD;  Location: [facility];  Service: Vascular;  Laterality: Left;   bladder tack     BREAST REDUCTION SURGERY Bilateral 10/10/2015   Procedure: BILATERAL MAMMARY REDUCTION  (BREAST) WITH FREE NIPPLE GRAFT;  Surgeon: Irene Limbo, MD;  Location: [facility];  Service: Plastics;   Laterality: Bilateral;   CARDIAC CATHETERIZATION     CATARACT EXTRACTION Right    COLONOSCOPY  2009   COLONOSCOPY W/ BIOPSIES AND POLYPECTOMY     CORONARY ARTERY BYPASS GRAFT N/A 01/09/2014   Procedure: CORONARY ARTERY BYPASS GRAFTING (CABG) x 5 using left internal mammary artery and right leg greater saphenous vein harvested endoscopically;  Surgeon: Ivin Poot, MD;  Location: [facility];  Service: Open Heart Surgery;  Laterality: N/A;  please use bed extenders and breast binder   INTRAOPERATIVE TRANSESOPHAGEAL ECHOCARDIOGRAM N/A 01/09/2014   Procedure: INTRAOPERATIVE TRANSESOPHAGEAL ECHOCARDIOGRAM;  Surgeon: Ivin Poot, MD;  Location: [facility];  Service: Open Heart Surgery;  Laterality: N/A;   KNEE ARTHROSCOPY  1991   KNEE SURGERY Bilateral 1999  LEFT HEART CATHETERIZATION WITH CORONARY ANGIOGRAM N/A 01/04/2014   Procedure: LEFT HEART CATHETERIZATION WITH CORONARY ANGIOGRAM;  Surgeon: Sinclair Grooms, MD;  Location: [facility] CATH LAB;  Service: Cardiovascular;  Laterality: N/A;   LESION EXCISION WITH COMPLEX REPAIR N/A 05/07/2016   Procedure: COMPLEX REPAIR OF CHEST 16 CM;  Surgeon: Irene Limbo, MD;  Location: [facility];  Service: Plastics;  Laterality: N/A;   MULTIPLE EXTRACTIONS WITH ALVEOLOPLASTY N/A 04/11/2014   Procedure: Extraction of tooth #'s 1,2,8,16 with alveoloplasty, maxillary tuberosity reductions, and gross debridement of remaining teeth.;  Surgeon: Lenn Cal, DDS;  Location: [facility];  Service: Oral Surgery;  Laterality: N/A;   NM MYOCAR PERF WALL MOTION  01/2010   dipyridamole myoview - moderate perfusion defect in basal inferoseptal, basal inferior, mid inferoseptal, mid inferior, apical inferior region; EF 56%   TEE WITHOUT CARDIOVERSION  01/2010   EF 60-65%, small, flat, non-infiltrating, calcified, fixed apical/septal mass   TOTAL KNEE ARTHROPLASTY Left 04/29/2011   transanal hemorrhoidal dearterliaization  01/06/2012   with external hemorrhoid removal   UNILATERAL  SALPINGECTOMY Right 06/03/2014   Procedure: UNILATERAL SALPINGECTOMY;  Surgeon: Lavonia Drafts, MD;  Location: [facility];  Service: Gynecology;  Laterality: Right;   VAGINAL HYSTERECTOMY N/A 06/03/2014   Procedure: HYSTERECTOMY VAGINAL;  Surgeon: Lavonia Drafts, MD;  Location: [facility];  Service: Gynecology;  Laterality: N/A;   WISDOM TOOTH EXTRACTION     Social History   Social History Narrative   Current Social History 08/27/2020        Patient lives alone in a handicap-accessible ground floor apartment which is 1 story. There are not steps up to the entrance the patient uses.       Patient's method of transportation is personal car or family member.      The highest level of education was 2 years of college.      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 [employer]      Right Handed      Identified important Relationships are My grandchildren, daughter, son       Pets : None       Interests / Fun: Plants       Current Stressors: Son with special needs (Bi-polar/schizophrenia) , he is not living with her        Religious / Personal Beliefs: You T[employer] mentors (Vegan videos)      [place], RN,BSN          Immunization History  Administered Date(s) Administered   Influenza Split 01/07/2012   Influenza Whole 10/31/2007, 09/18/2009, 09/25/2010   Influenza, Seasonal, Injecte, Preservative Fre 11/28/2012   Influenza,inj,Quad PF,6+ Mos 08/07/2013, 01/02/2014, 08/01/2015, 09/20/2016, 09/05/2017, 10/16/2018   Influenza-Unspecified 09/07/2021   PFIZER(Purple Top)SARS-COV-2 Vaccination 04/10/2020, 05/05/2020   Pneumococcal Conjugate-13 07/14/2020   Pneumococcal Polysaccharide-23 01/07/2012, 10/24/2017   Td 10/15/2009   Tdap 02/23/2021     Objective: Vital Signs: BP (!) 161/79 (BP Location: Right Arm, Patient Position: Sitting, Cuff Size: Normal)   Pulse 69   Resp 16   Ht '5\' 8"'  (1.727 m)   Wt 277 lb (125.6 kg)   LMP 01/13/2014  Comment: spotting in Feb and bleeding since April  BMI 42.12 kg/m    Physical Exam Constitutional:      Appearance: She is obese.  Cardiovascular:     Rate and Rhythm: Normal rate and regular rhythm.  Pulmonary:     Effort: Pulmonary effort is normal.     Breath sounds: Normal breath sounds.  Skin:    General: Skin is warm and dry.     Findings: No rash.  Neurological:     Mental Status: She is alert.     Musculoskeletal Exam:  Bilateral shoulder mild tenderness and left decreased abduction ROM Elbows full ROM no tenderness or swelling Wrists full ROM no tenderness or swelling Bilateral 3rd PIP joint tenderness and decreased extension ROM, right 2nd MCP tenderness, CMC joints tender bilaterally Knees full ROM b/l, right sided swelling present Ankles full ROM no tenderness or swelling   Investigation: No additional findings.  Imaging: XR Hand 2 View Left  Result Date: 04/06/2022 X-ray left hand 2 views Radiocarpal joint space appears normal.  There are cystic changes present in the carpal bones some possibly at the proximal edge of the lunate.  Degenerative arthritis changes in the first [facility] and first IP joints more advanced compared to right hand.  MCP joint spaces appear normal.  There is mild DIP joint changes with third digit joint space narrowing and possible early osteophyte versus periarticular calcification at second DIP. Impression Mild to moderate osteoarthritis changes most advanced in DIP joints in the thumb worse compared to right hand imaging reviewed, no erosive disease  XR Hand 2 View Right  Result Date: 04/06/2022 X-ray right hand 2 views Radiocarpal joint space appears normal.  There are several cystic changes most prominent in scaphoid and lunate.  MCP joint spaces appear normal.  Mild DIP joint degenerative changes most visible in the third digit with some asymmetric joint space narrowing.  Bone mineralization appears normal and no erosions seen. Impression  Mild joint changes most consistent with osteoarthritis no erosive disease   Recent Labs: Lab Results  Component Value Date   WBC 5.3 01/11/2022   HGB 13.6 01/11/2022   PLT 234 01/11/2022   NA 138 01/11/2022   K 4.3 01/11/2022   CL 100 01/11/2022   CO2 25 01/11/2022   GLUCOSE 101 (H) 01/11/2022   BUN 9 01/11/2022   CREATININE 0.80 01/11/2022   BILITOT 0.6 03/31/2022   ALKPHOS 46 10/16/2018   AST 13 03/31/2022   ALT 8 03/31/2022   PROT 7.2 03/31/2022   ALBUMIN 4.1 10/16/2018   CALCIUM 9.6 01/11/2022   GFRAA 99 07/14/2020    Speciality Comments: No specialty comments available.  Procedures:  No procedures performed Allergies: Cephalosporins, Eicosapentaenoic acid (epa), Fish-derived products, Peanuts [peanut oil], Shrimp [shellfish allergy], Latex, Metronidazole, Ciprofloxacin, Lisinopril, Naproxen, Other, and Prednisone   Assessment / Plan:     Visit Diagnoses: Polyarthritis with positive rheumatoid factor ([place]) - Plan: methotrexate (RHEUMATREX) 2.5 MG tablet  Reported symptoms are suspicious though still not 100% definitive with seropositive RA really representing the cause of her symptoms.  PMR seems very unlikely with now normal sedimentation rate and the amount of peripheral involvement.  She requests to start any tried medication at the lowest possible dose.  Recommend starting methotrexate 10 mg p.o. weekly with folic acid 1 mg daily for suspected seropositive RA.  High risk medication use - Plan: CBC with Differential/Platelet, COMPLETE METABOLIC PANEL WITH GFR  Baseline labs are all okay.  Discussed risks of methotrexate particularly cytopenias and hepatotoxicity.  Discussed she needs repeat CBC and CMP checked 2 to 4 weeks after new medication start for methotrexate toxicity monitoring.  Can follow-up for face-to-face reevaluation at about 3 months.  Tendinitis of both rotator cuffs - Bilateral rotator cuff arthropathy from tendinitis or bursitis could be chronic  degenerative related but could be consistent with inflammatory  arthritis process    Orders: Orders Placed This Encounter  Procedures   CBC with Differential/Platelet   COMPLETE METABOLIC PANEL WITH GFR   Meds ordered this encounter  Medications   methotrexate (RHEUMATREX) 2.5 MG tablet    Sig: Take 4 tablets (10 mg total) by mouth once a week. Caution:Chemotherapy. Protect from light.    Dispense:  20 tablet    Refill:  0   folic acid (FOLVITE) 1 MG tablet    Sig: Take 1 tablet (1 mg total) by mouth daily.    Dispense:  90 tablet    Refill:  0     Follow-Up Instructions: Return in about 3 months (around 07/22/2022) for PMR/?RA MTX start f/u 7mo.   CCollier Salina MD  Note - This record has been created using [employer]  Chart creation errors have been sought, but may not always  have been located. Such creation errors do not reflect on  the standard of medical care.

## 2022-04-09 ENCOUNTER — Ambulatory Visit: Payer: Medicare Other | Admitting: Obstetrics and Gynecology

## 2022-04-09 DIAGNOSIS — K047 Periapical abscess without sinus: Secondary | ICD-10-CM | POA: Diagnosis not present

## 2022-04-21 ENCOUNTER — Ambulatory Visit (INDEPENDENT_AMBULATORY_CARE_PROVIDER_SITE_OTHER): Payer: Medicare Other | Admitting: Internal Medicine

## 2022-04-21 ENCOUNTER — Encounter: Payer: Self-pay | Admitting: Internal Medicine

## 2022-04-21 VITALS — BP 161/79 | HR 69 | Resp 16 | Ht 68.0 in | Wt 277.0 lb

## 2022-04-21 DIAGNOSIS — M7582 Other shoulder lesions, left shoulder: Secondary | ICD-10-CM

## 2022-04-21 DIAGNOSIS — M0580 Other rheumatoid arthritis with rheumatoid factor of unspecified site: Secondary | ICD-10-CM | POA: Diagnosis not present

## 2022-04-21 DIAGNOSIS — Z79899 Other long term (current) drug therapy: Secondary | ICD-10-CM | POA: Diagnosis not present

## 2022-04-21 DIAGNOSIS — M7581 Other shoulder lesions, right shoulder: Secondary | ICD-10-CM | POA: Diagnosis not present

## 2022-04-21 MED ORDER — FOLIC ACID 1 MG PO TABS: 1.0000 mg | ORAL_TABLET | Freq: Every day | ORAL | 0 refills | Status: DC

## 2022-04-21 MED ORDER — METHOTREXATE 2.5 MG PO TABS: 10.0000 mg | ORAL_TABLET | ORAL | 0 refills | Status: DC

## 2022-04-21 NOTE — Patient Instructions (Signed)
Methotrexate Tablets What is this medication? METHOTREXATE (METH oh TREX ate) treats inflammatory conditions such as arthritis and psoriasis. It works by decreasing inflammation, which can reduce pain and prevent long-term injury to the joints and skin. It may also be used to treat some types of cancer. It works by slowing down the growth of cancer cells. This medicine may be used for other purposes; ask your health care provider or pharmacist if you have questions. COMMON BRAND NAME(S): Rheumatrex, Trexall What should I tell my care team before I take this medication? They need to know if you have any of these conditions: Fluid in the stomach area or lungs If you often drink alcohol Infection or immune system problems Kidney disease or on hemodialysis Liver disease Low blood counts, like low white cell, platelet, or red cell counts Lung disease Radiation therapy Stomach ulcers Ulcerative colitis An unusual or allergic reaction to methotrexate, other medications, foods, dyes, or preservatives Pregnant or trying to get pregnant Breast-feeding How should I use this medication? Take this medication by mouth with a glass of water. Follow the directions on the prescription label. Take your medication at regular intervals. Do not take it more often than directed. Do not stop taking except on your care team's advice. Make sure you know why you are taking this medication and how often you should take it. If this medication is used for a condition that is not cancer, like arthritis or psoriasis, it should be taken weekly, NOT daily. Taking this medication more often than directed can cause serious side effects, even death. Talk to your care team about safe handling and disposal of this medication. You may need to take special precautions. Talk to your care team about the use of this medication in children. While this medication may be prescribed for selected conditions, precautions do  apply. Overdosage: If you think you have taken too much of this medicine contact a poison control center or emergency room at once. NOTE: This medicine is only for you. Do not share this medicine with others. What if I miss a dose? If you miss a dose, talk with your care team. Do not take double or extra doses. What may interact with this medication? Do not take this medication with any of the following: Acitretin This medication may also interact with the following: Aspirin and aspirin-like medications including salicylates Azathioprine Certain antibiotics like penicillins, tetracycline, and chloramphenicol Certain medications that treat or prevent blood clots like warfarin, apixaban, dabigatran, and rivaroxaban Certain medications for stomach problems like esomeprazole, omeprazole, pantoprazole Cyclosporine Dapsone Diuretics Gold Hydroxychloroquine Live virus vaccines Medications for infection like acyclovir, adefovir, amphotericin B, bacitracin, cidofovir, foscarnet, ganciclovir, gentamicin, pentamidine, vancomycin Mercaptopurine NSAIDs, medications for pain and inflammation, like ibuprofen or naproxen Other cytotoxic agents Pamidronate Pemetrexed Penicillamine Phenylbutazone Phenytoin Probenecid Pyrimethamine Retinoids such as isotretinoin and tretinoin Steroid medications like prednisone or cortisone Sulfonamides like sulfasalazine and trimethoprim/sulfamethoxazole Theophylline Zoledronic acid This list may not describe all possible interactions. Give your health care provider a list of all the medicines, herbs, non-prescription drugs, or dietary supplements you use. Also tell them if you smoke, drink alcohol, or use illegal drugs. Some items may interact with your medicine. What should I watch for while using this medication? Avoid alcoholic drinks. This medication can make you more sensitive to the sun. Keep out of the sun. If you cannot avoid being in the sun, wear  protective clothing and use sunscreen. Do not use sun lamps or tanning beds/booths. You may need   blood work done while you are taking this medication. Call your care team for advice if you get a fever, chills or sore throat, or other symptoms of a cold or flu. Do not treat yourself. This medication decreases your body's ability to fight infections. Try to avoid being around people who are sick. This medication may increase your risk to bruise or bleed. Call your care team if you notice any unusual bleeding. Be careful brushing or flossing your teeth or using a toothpick because you may get an infection or bleed more easily. If you have any dental work done, tell your dentist you are receiving this medication. Check with your care team if you get an attack of severe diarrhea, nausea and vomiting, or if you sweat a lot. The loss of too much body fluid can make it dangerous for you to take this medication. Talk to your care team about your risk of cancer. You may be more at risk for certain types of cancers if you take this medication. Do not become pregnant while taking this medication or for 6 months after stopping it. Women should inform their care team if they wish to become pregnant or think they might be pregnant. Men should not father a child while taking this medication and for 3 months after stopping it. There is potential for serious harm to an unborn child. Talk to your care team for more information. Do not breast-feed an infant while taking this medication or for 1 week after stopping it. This medication may make it more difficult to get pregnant or father a child. Talk to your care team if you are concerned about your fertility. What side effects may I notice from receiving this medication? Side effects that you should report to your care team as soon as possible: Allergic reactions--skin rash, itching, hives, swelling of the face, lips, tongue, or throat Blood clot--pain, swelling, or warmth  in the leg, shortness of breath, chest pain Dry cough, shortness of breath or trouble breathing Infection--fever, chills, cough, sore throat, wounds that don't heal, pain or trouble when passing urine, general feeling of discomfort or being unwell Kidney injury--decrease in the amount of urine, swelling of the ankles, hands, or feet Liver injury--right upper belly pain, loss of appetite, nausea, light-colored stool, dark yellow or brown urine, yellowing of the skin or eyes, unusual weakness or fatigue Low red blood cell count--unusual weakness or fatigue, dizziness, headache, trouble breathing Redness, blistering, peeling, or loosening of the skin, including inside the mouth Seizures Unusual bruising or bleeding Side effects that usually do not require medical attention (report to your care team if they continue or are bothersome): Diarrhea Dizziness Hair loss Nausea Pain, redness, or swelling with sores inside the mouth or throat Vomiting This list may not describe all possible side effects. Call your doctor for medical advice about side effects. You may report side effects to FDA at 1-800-FDA-1088. Where should I keep my medication? Keep out of the reach of children and pets. Store at room temperature between 20 and 25 degrees C (68 and 77 degrees F). Protect from light. Get rid of any unused medication after the expiration date. Talk to your care team about how to dispose of unused medication. Special directions may apply. NOTE: This sheet is a summary. It may not cover all possible information. If you have questions about this medicine, talk to your doctor, pharmacist, or health care provider.  2023 Elsevier/Gold Standard (2021-01-12 00:00:00)

## 2022-04-23 ENCOUNTER — Telehealth: Payer: Self-pay | Admitting: Internal Medicine

## 2022-04-23 NOTE — Telephone Encounter (Signed)
Pt c/o of Chest Pain: STAT if CP now or developed within 24 hours  1. Are you having CP right now? no  2. Are you experiencing any other symptoms (ex. SOB, nausea, vomiting, sweating)? no  3. How long have you been experiencing CP? N/a  4. Is your CP continuous or coming and going? Coming and going  5. Have you taken Nitroglycerin? N/a  Patient sent message to scheduling regarding chest discomfort, she states,  "They come and go l have no shortness of breath  I have a keloid in my cleavage that's causing a lot of irritation, and or discomfort that may be most of my problem"    ?

## 2022-04-23 NOTE — Telephone Encounter (Signed)
Left message to call back  

## 2022-04-28 ENCOUNTER — Encounter: Payer: Self-pay | Admitting: Obstetrics and Gynecology

## 2022-04-28 ENCOUNTER — Ambulatory Visit (INDEPENDENT_AMBULATORY_CARE_PROVIDER_SITE_OTHER): Payer: Medicare Other | Admitting: Obstetrics and Gynecology

## 2022-04-28 VITALS — BP 158/82 | HR 61

## 2022-04-28 DIAGNOSIS — N993 Prolapse of vaginal vault after hysterectomy: Secondary | ICD-10-CM | POA: Diagnosis not present

## 2022-04-28 DIAGNOSIS — N8110 Cystocele, unspecified: Secondary | ICD-10-CM | POA: Diagnosis not present

## 2022-04-28 NOTE — Progress Notes (Signed)
[facility] Return Visit  SUBJECTIVE  History of Present Illness: [name] is a 66 y.o. female seen in follow-up for prolapse and incontinence. She underwent urodynamic testing.   Pessary has been working well. It has not fallen out in a month. She is had some trouble putting in the pessary by herself due to arthritis. She has been feeling a "ball" in the vagina.    Past Medical History: Patient  has a past medical history of Acute urinary retention s/p Foley (01/07/2012), Allergy, Anemia, Asthma, CAD (coronary artery disease), Carotid arterial disease ([place]), Carpal tunnel syndrome, bilateral, GERD (gastroesophageal reflux disease), Hemorrhoids, internal, with bleeding & prolapse (12/13/2011), Hernia, abdominal, History of blood transfusion, History of kidney stones, Hyperlipidemia, Hypertension, Iron deficiency (12/07/2011), Keloid scar, Morbid obesity ([place]), Myocardial infarct ([place]), Neuropathy, Osteoarthritis, Osteoporosis, PONV (postoperative nausea and vomiting), Seasonal allergies, Sleep apnea, Tooth abscess, and Uterine cancer ([place]) (05/2014).   Past Surgical History: She  has a past surgical history that includes Knee surgery (Bilateral, 1999); Total knee arthroplasty (Left, 04/29/2011); Wisdom tooth extraction; Colonoscopy (2009); Knee arthroscopy (1991); transanal hemorrhoidal dearterliaization (01/06/2012); Coronary artery bypass graft (N/A, 01/09/2014); Intraoprative transesophageal echocardiogram (N/A, 01/09/2014); TEE without cardioversion (01/2010); NM MYOCAR PERF WALL MOTION (01/2010); Cardiac catheterization; Multiple extractions with alveoloplasty (N/A, 04/11/2014); Vaginal hysterectomy (N/A, 06/03/2014); Unilateral salpingectomy (Right, 06/03/2014); left heart catheterization with coronary angiogram (N/A, 01/04/2014); Breast reduction surgery (Bilateral, 10/10/2015); Colonoscopy w/ biopsies and polypectomy; Lesion excision with complex repair (N/A, 05/07/2016);  Cataract extraction (Right); Artery Biopsy (Left, 07/10/2021); and bladder tack.   Medications: She has a current medication list which includes the following prescription(s): acetaminophen, albuterol, aspirin ec, vitamin d high potency, diclofenac sodium, esomeprazole, folic acid, losartan, metoprolol tartrate, multivitamin with minerals, polyethyl glycol-propyl glycol, rosuvastatin, methotrexate, and nystatin cream.   Allergies: Patient is allergic to cephalosporins, eicosapentaenoic acid (epa), fish-derived products, peanuts [peanut oil], shrimp [shellfish allergy], latex, metronidazole, ciprofloxacin, lisinopril, naproxen, other, and prednisone.   Social History: Patient  reports that she has never smoked. She has never used smokeless tobacco. She reports that she does not drink alcohol and does not use drugs.      OBJECTIVE     Physical Exam: Vitals:   04/28/22 1600  BP: (!) 158/82  Pulse: 61   Gen: No apparent distress, A&O x 3.  Detailed Urogynecologic Evaluation:  Pessary noted to be in place but anterior vaginal prolapse present around the pessary. Pessary was removed. Speculum exam revealed no vaginal lesions. She was fit with a #3 cube pessary (Lot#[id number]). It was comfortable, fit well, and stayed in placed with strong cough, valsalva and bending.    Prior exam showed: POP-Q:    POP-Q   3                                            Aa   3                                           Ba   -7  C    7                                            Gh   3.5                                            Pb   9                                            tvl    -3                                            Ap   -3                                            Bp                                                  D        ASSESSMENT AND PLAN    [name] is a 66 y.o. with:  1. Prolapse of anterior vaginal wall   2. Vaginal  vault prolapse after hysterectomy     - Fit with a #3 cube pessary. Also gave her prior shaatz pessary to keep in case this new pessary does not work out well.   Return 3 months for pessary cleaning or sooner if needed.   [name], MD

## 2022-04-29 NOTE — Telephone Encounter (Signed)
Left message for pt to call.

## 2022-05-03 ENCOUNTER — Ambulatory Visit (INDEPENDENT_AMBULATORY_CARE_PROVIDER_SITE_OTHER): Payer: Medicare Other | Admitting: Student in an Organized Health Care Education/Training Program

## 2022-05-03 ENCOUNTER — Encounter: Payer: Self-pay | Admitting: Student in an Organized Health Care Education/Training Program

## 2022-05-03 VITALS — BP 145/66 | HR 51 | Temp 97.8°F | Ht 68.0 in | Wt 279.0 lb

## 2022-05-03 DIAGNOSIS — E785 Hyperlipidemia, unspecified: Secondary | ICD-10-CM | POA: Diagnosis not present

## 2022-05-03 DIAGNOSIS — M0580 Other rheumatoid arthritis with rheumatoid factor of unspecified site: Secondary | ICD-10-CM

## 2022-05-03 MED ORDER — NYSTATIN 100000 UNIT/GM EX CREA: 1.0000 "application " | TOPICAL_CREAM | Freq: Two times a day (BID) | CUTANEOUS | 1 refills | Status: DC | PRN

## 2022-05-03 NOTE — Assessment & Plan Note (Signed)
Being managed by Dr. Benjamine Mola with Rheumatology. Patient is intolerant of steroids, she has been prescribed methotrexate but is understandably afraid of potential adverse effects. We talked about the goals of treatment, about her disease course, and I recommended trial of this therapy to improve her quality of life. We talked about the need for folate supplementation and quarterly labs while on methotrexate which she understands. Joint pain is currently quiescent, but very likely to return soon without treatment.

## 2022-05-03 NOTE — Assessment & Plan Note (Signed)
History of ischemic heart disease with some intolerance to statins in the past. LDL was above goal at last visit, we restarted rosuvastatin '10mg'$  daily at last visit. Will recheck lipids today.

## 2022-05-03 NOTE — Progress Notes (Signed)
   Established Patient Office Visit  Subjective   Patient ID: [name], female    DOB: 1956/08/06  Age: 66 y.o. MRN: [id number]  Chief Complaint  Patient presents with   Follow-up    HPI  66 year old person here for follow up of hyperlipidemia. At our last visit we injected her right shoulder with steroid for arthritis which had good benefit. She had a consultation with Dr. Benjamine Mola with rheumatology, diagnosed with a RF positive polyarthritis and recommended to start methotrexate therapy. She has the medicine, but not yet started, understandably nervous from her research. Her joint pain has been ok the last few weeks. She has been functional and active, needs a little help with shopping. Doing well with other medicines, no side effects, she did start crestor as we discussed at last visit, she is tolerating it well. She is working on Medical illustrator.     Objective:     BP (!) 145/66 (BP Location: Right Arm, Patient Position: Sitting, Cuff Size: Normal)   Pulse (!) 51   Temp 97.8 F (36.6 C) (Oral)   Ht '5\' 8"'$  (1.727 m)   Wt 279 lb (126.6 kg)   LMP 01/13/2014 Comment: spotting in Feb and bleeding since April  SpO2 100%   BMI 42.42 kg/m    Physical Exam  Gen: well appearing woman CV: RRR, no murmurs Lungs: unlabored, clear throughout Ext: warm, no edema, no synovitis    Assessment & Plan:   Problem List Items Addressed This Visit       High   Polyarthritis with positive rheumatoid factor (HCC) (Chronic)    Being managed by Dr. Benjamine Mola with Rheumatology. Patient is intolerant of steroids, she has been prescribed methotrexate but is understandably afraid of potential adverse effects. We talked about the goals of treatment, about her disease course, and I recommended trial of this therapy to improve her quality of life. We talked about the need for folate supplementation and quarterly labs while on methotrexate which she understands. Joint pain is currently quiescent, but  very likely to return soon without treatment.        Low   Hyperlipidemia - Primary (Chronic)    History of ischemic heart disease with some intolerance to statins in the past. LDL was above goal at last visit, we restarted rosuvastatin '10mg'$  daily at last visit. Will recheck lipids today.      Relevant Orders   Lipid Profile    Return in about 6 months (around 11/02/2022).    Axel Filler, MD

## 2022-05-04 ENCOUNTER — Encounter: Payer: Self-pay | Admitting: Student in an Organized Health Care Education/Training Program

## 2022-05-04 LAB — LIPID PANEL
Chol/HDL Ratio: 2.8 ratio (ref 0.0–4.4)
Cholesterol, Total: 147 mg/dL (ref 100–199)
HDL: 53 mg/dL (ref >39)
LDL Chol Calc (NIH): 77 mg/dL (ref 0–99)
Triglycerides: 89 mg/dL (ref 0–149)
VLDL Cholesterol Cal: 17 mg/dL (ref 5–40)

## 2022-05-11 NOTE — Telephone Encounter (Signed)
Multiple attempts to reach by phone with no success

## 2022-05-23 ENCOUNTER — Other Ambulatory Visit: Payer: Self-pay | Admitting: Internal Medicine

## 2022-05-23 DIAGNOSIS — M0580 Other rheumatoid arthritis with rheumatoid factor of unspecified site: Secondary | ICD-10-CM

## 2022-05-24 ENCOUNTER — Other Ambulatory Visit: Payer: Self-pay | Admitting: *Deleted

## 2022-05-24 DIAGNOSIS — M0580 Other rheumatoid arthritis with rheumatoid factor of unspecified site: Secondary | ICD-10-CM

## 2022-05-24 DIAGNOSIS — Z79899 Other long term (current) drug therapy: Secondary | ICD-10-CM

## 2022-05-24 NOTE — Telephone Encounter (Signed)
Needs follow up labs checked after starting methotrexate in May.

## 2022-05-24 NOTE — Telephone Encounter (Signed)
I called patient to have labs drawn.

## 2022-07-16 NOTE — Progress Notes (Unsigned)
Office Visit Note  Patient: [name]             Date of Birth: 1956/01/17           MRN: [id number]             PCP: Axel Filler, MD Referring: Axel Filler Visit Date: 07/21/2022   Subjective:  No chief complaint on file.   History of Present Illness: [name] is a 66 y.o. female here for follow up for follow up for chronic joint pains especially bilateral shoulder pain with significant morning stiffness and previous concern for PMR with ultrasound findings consistent with rotator cuff tendonitis   Previous HPI 04/21/2022 [name] is a 66 y.o. female here for follow up for chronic joint pains especially bilateral shoulder pain with significant morning stiffness and previous concern for PMR with ultrasound findings consistent with rotator cuff tendonitis.  Since our visit she continues having joint pains in several areas although these are not severely limiting her activity.  She has seen some swelling coming and going in the second MCP joint of her right hand.  She also keeps some amount of pain and swelling in the right knee consistently.  Lab tests at our initial visit showed normal sedimentation rate negative hepatitis screening and normal LFTs. Xrays demonstrating OA most severe in 1st CMC joints otherwise not so advanced and no erosions.   Previous HPI 03/31/2022 [name] is a 66 y.o. female here for evaluation of chronic joint pains especially bilateral shoulder pain with significant morning stiffness and previous concern for PMR with ultrasound findings consistent with rotator cuff tendonitis. She has longstanding knee and lumbar spine osteoarthritis which limits mobility. Previous knee aspiration with non inflammatory synovial fluid analysis 2 and 3 years ago. She had concern for GCA due to onset of headaches and highly elevated sedimentation rate test last year, but negative TA biopsy was obtained with Dr. Katy Fitch. She did not take  recommended prednisone course due to side effects. Shoulder pain and also decreased hip flexion mobility ongoing symptoms despite resolution of headache complaints from last year.  More recent inflammatory markers in February or decreased to normal but had low positive rheumatoid factor.   Labs reviewed 12/2021 RF 24.3 CCP neg ESR 11 CRP 6   06/2021 ESR 100   08/2017 HCV neg   No Rheumatology ROS completed.   PMFS History:  Patient Active Problem List   Diagnosis Date Noted   Polyarthritis with positive rheumatoid factor ([place]) 03/31/2022   High risk medication use 03/31/2022   Tendinitis of both rotator cuffs 09/07/2021   Keloid scar of skin 09/07/2021   Pes planus of left foot 09/08/2020   Hallux valgus 09/08/2020   Glucose intolerance (impaired glucose tolerance) 07/14/2020   Vitamin D deficiency 07/14/2020   Depression 10/16/2018   Osteoarthritis of left shoulder 10/24/2017   Peripheral neuropathy 08/19/2017   Atherosclerosis of aorta ([place]) 06/20/2015   Osteoarthritis of right knee 11/07/2014   Health care maintenance 10/09/2014   Coronary atherosclerosis of native coronary artery 01/05/2014   Morbid obesity due to excess calories ([place]) 05/10/2012   Obstructive sleep apnea 03/27/2010   GERD 08/06/2008   Hyperlipidemia 10/31/2007   Essential hypertension 06/30/2007    Past Medical History:  Diagnosis Date   Acute urinary retention s/p Foley 01/07/2012   Allergy    Anemia    Asthma    CAD (coronary artery disease)    a. 12/2013 s/p  CABG x 5 Dr. Darcey Nora (LIMA to LAD, SVG to diagonal, SVG to OM1, SVG to OM2, SVG to PDA)   Carotid arterial disease ([place])    a. 12/2013 Carotid U/S: 1-39% bilat ICA stenosis.   Carpal tunnel syndrome, bilateral    GERD (gastroesophageal reflux disease)    Hemorrhoids, internal, with bleeding & prolapse 12/13/2011   Hernia, abdominal    History of blood transfusion    CABG and hysterectomy   History of kidney stones    Hyperlipidemia     Hypertension    Iron deficiency 12/07/2011   Keloid scar    a. Sternal Keloid s/p CABG with ongoing pain.   Morbid obesity ([place])    Myocardial infarct ([place])    2015   Neuropathy    Osteoarthritis    Osteoporosis    PONV (postoperative nausea and vomiting)    Seasonal allergies    Sleep apnea    no CPAP machine; sleep study 02/2010 REM AHI 61.7/hr, total sleep REM 14.8/hr   Tooth abscess    Uterine cancer ([place]) 05/2014   clinical stage IA grade 1 endometrioid endometrial cancer    Family History  Problem Relation Age of Onset   Heart attack Mother 78   Heart disease Mother    Hypertension Mother    Diabetes Mother    Hypertension Sister    Heart failure Sister    Heart disease Sister    Diabetes Sister    Kidney disease Sister    Kidney disease Brother    Hypertension Daughter    Bipolar disorder Son    Schizophrenia Son    Anxiety disorder Son    Esophageal cancer Other        nephew   Colon cancer Neg Hx    Pancreatic cancer Neg Hx    Stomach cancer Neg Hx    Liver disease Neg Hx    Rectal cancer Neg Hx    Past Surgical History:  Procedure Laterality Date   ARTERY BIOPSY Left 07/10/2021   Procedure: LEFT TEMPORAL ARTERY BIOPSY;  Surgeon: Serafina Mitchell, MD;  Location: [facility];  Service: Vascular;  Laterality: Left;   bladder tack     BREAST REDUCTION SURGERY Bilateral 10/10/2015   Procedure: BILATERAL MAMMARY REDUCTION  (BREAST) WITH FREE NIPPLE GRAFT;  Surgeon: Irene Limbo, MD;  Location: [facility];  Service: Plastics;  Laterality: Bilateral;   CARDIAC CATHETERIZATION     CATARACT EXTRACTION Right    COLONOSCOPY  2009   COLONOSCOPY W/ BIOPSIES AND POLYPECTOMY     CORONARY ARTERY BYPASS GRAFT N/A 01/09/2014   Procedure: CORONARY ARTERY BYPASS GRAFTING (CABG) x 5 using left internal mammary artery and right leg greater saphenous vein harvested endoscopically;  Surgeon: Ivin Poot, MD;  Location: [facility];  Service: Open Heart Surgery;  Laterality: N/A;   please use bed extenders and breast binder   INTRAOPERATIVE TRANSESOPHAGEAL ECHOCARDIOGRAM N/A 01/09/2014   Procedure: INTRAOPERATIVE TRANSESOPHAGEAL ECHOCARDIOGRAM;  Surgeon: Ivin Poot, MD;  Location: [facility];  Service: Open Heart Surgery;  Laterality: N/A;   KNEE ARTHROSCOPY  1991   KNEE SURGERY Bilateral 1999   LEFT HEART CATHETERIZATION WITH CORONARY ANGIOGRAM N/A 01/04/2014   Procedure: LEFT HEART CATHETERIZATION WITH CORONARY ANGIOGRAM;  Surgeon: Sinclair Grooms, MD;  Location: [facility];  Service: Cardiovascular;  Laterality: N/A;   LESION EXCISION WITH COMPLEX REPAIR N/A 05/07/2016   Procedure: COMPLEX REPAIR OF CHEST 16 CM;  Surgeon: Irene Limbo, MD;  Location: [facility];  Service: Clinical cytogeneticist;  Laterality: N/A;   MULTIPLE EXTRACTIONS WITH ALVEOLOPLASTY N/A 04/11/2014   Procedure: Extraction of tooth #'s 1,2,8,16 with alveoloplasty, maxillary tuberosity reductions, and gross debridement of remaining teeth.;  Surgeon: Lenn Cal, DDS;  Location: [facility];  Service: Oral Surgery;  Laterality: N/A;   NM MYOCAR PERF WALL MOTION  01/2010   dipyridamole myoview - moderate perfusion defect in basal inferoseptal, basal inferior, mid inferoseptal, mid inferior, apical inferior region; EF 56%   TEE WITHOUT CARDIOVERSION  01/2010   EF 60-65%, small, flat, non-infiltrating, calcified, fixed apical/septal mass   TOTAL KNEE ARTHROPLASTY Left 04/29/2011   transanal hemorrhoidal dearterliaization  01/06/2012   with external hemorrhoid removal   UNILATERAL SALPINGECTOMY Right 06/03/2014   Procedure: UNILATERAL SALPINGECTOMY;  Surgeon: Lavonia Drafts, MD;  Location: [facility];  Service: Gynecology;  Laterality: Right;   VAGINAL HYSTERECTOMY N/A 06/03/2014   Procedure: HYSTERECTOMY VAGINAL;  Surgeon: Lavonia Drafts, MD;  Location: [facility];  Service: Gynecology;  Laterality: N/A;   WISDOM TOOTH EXTRACTION     Social History   Social History Narrative   Current Social History  08/27/2020        Patient lives alone in a handicap-accessible ground floor apartment which is 1 story. There are not steps up to the entrance the patient uses.       Patient's method of transportation is personal car or family member.      The highest level of education was 2 years of college.      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 [employer]      Right Handed      Identified important Relationships are My grandchildren, daughter, son       Pets : None       Interests / Fun: Plants       Current Stressors: Son with special needs (Bi-polar/schizophrenia) , he is not living with her        Religious / Personal Beliefs: You Tube mentors (Vegan videos)      [place], RN,BSN          Immunization History  Administered Date(s) Administered   Influenza Split 01/07/2012   Influenza Whole 10/31/2007, 09/18/2009, 09/25/2010   Influenza, Seasonal, Injecte, Preservative Fre 11/28/2012   Influenza,inj,Quad PF,6+ Mos 08/07/2013, 01/02/2014, 08/01/2015, 09/20/2016, 09/05/2017, 10/16/2018   Influenza-Unspecified 09/07/2021   PFIZER(Purple Top)SARS-COV-2 Vaccination 04/10/2020, 05/05/2020   Pneumococcal Conjugate-13 07/14/2020   Pneumococcal Polysaccharide-23 01/07/2012, 10/24/2017   Td 10/15/2009   Tdap 02/23/2021     Objective: Vital Signs: LMP 01/13/2014 Comment: spotting in Feb and bleeding since April   Physical Exam   Musculoskeletal Exam: ***  CDAI Exam: CDAI Score: -- Patient Global: --; Provider Global: -- Swollen: --; Tender: -- Joint Exam 07/21/2022   No joint exam has been documented for this visit   There is currently no information documented on the homunculus. Go to the Rheumatology activity and complete the homunculus joint exam.  Investigation: No additional findings.  Imaging: No results found.  Recent Labs: Lab Results  Component Value Date   WBC 5.3 01/11/2022   HGB 13.6 01/11/2022   PLT 234 01/11/2022   NA  138 01/11/2022   K 4.3 01/11/2022   CL 100 01/11/2022   CO2 25 01/11/2022   GLUCOSE 101 (H) 01/11/2022   BUN 9 01/11/2022   CREATININE 0.80 01/11/2022   BILITOT 0.6 03/31/2022   ALKPHOS 46 10/16/2018   AST 13 03/31/2022   ALT 8 03/31/2022  PROT 7.2 03/31/2022   ALBUMIN 4.1 10/16/2018   CALCIUM 9.6 01/11/2022   GFRAA 99 07/14/2020    Speciality Comments: No specialty comments available.  Procedures:  No procedures performed Allergies: Cephalosporins, Eicosapentaenoic acid (epa), Fish-derived products, Peanuts [peanut oil], Shrimp [shellfish allergy], Latex, Metronidazole, Ciprofloxacin, Lisinopril, Naproxen, Other, and Prednisone   Assessment / Plan:     Visit Diagnoses: No diagnosis found.  ***  Orders: No orders of the defined types were placed in this encounter.  No orders of the defined types were placed in this encounter.    Follow-Up Instructions: No follow-ups on file.   Bertram Savin, RT  Note - This record has been created using Editor, commissioning.  Chart creation errors have been sought, but may not always  have been located. Such creation errors do not reflect on  the standard of medical care.

## 2022-07-21 ENCOUNTER — Ambulatory Visit: Payer: Medicare Other | Attending: Internal Medicine | Admitting: Internal Medicine

## 2022-07-21 VITALS — BP 127/79 | HR 56 | Resp 15 | Ht 68.0 in | Wt 273.0 lb

## 2022-07-21 DIAGNOSIS — Z79899 Other long term (current) drug therapy: Secondary | ICD-10-CM

## 2022-07-21 DIAGNOSIS — M0580 Other rheumatoid arthritis with rheumatoid factor of unspecified site: Secondary | ICD-10-CM

## 2022-07-21 DIAGNOSIS — M7581 Other shoulder lesions, right shoulder: Secondary | ICD-10-CM | POA: Diagnosis not present

## 2022-07-21 DIAGNOSIS — M7582 Other shoulder lesions, left shoulder: Secondary | ICD-10-CM

## 2022-07-21 NOTE — Patient Instructions (Signed)
I recommend starting the methotrexate and returning for a labs visit in 2-3 weeks later. We can follow up at the clinic for medication effectiveness in 2-3 months.

## 2022-08-03 ENCOUNTER — Other Ambulatory Visit: Payer: Self-pay

## 2022-08-03 ENCOUNTER — Encounter (HOSPITAL_COMMUNITY): Payer: Self-pay | Admitting: Emergency Medicine

## 2022-08-03 ENCOUNTER — Emergency Department (HOSPITAL_COMMUNITY)
Admission: EM | Admit: 2022-08-03 | Discharge: 2022-08-04 | Disposition: A | Discharge status: Home / Self Care | Payer: Medicare Other | Attending: Student | Admitting: Student

## 2022-08-03 DIAGNOSIS — W260XXA Contact with knife, initial encounter: Secondary | ICD-10-CM | POA: Insufficient documentation

## 2022-08-03 DIAGNOSIS — S6992XA Unspecified injury of left wrist, hand and finger(s), initial encounter: Secondary | ICD-10-CM | POA: Diagnosis present

## 2022-08-03 DIAGNOSIS — S61412A Laceration without foreign body of left hand, initial encounter: Secondary | ICD-10-CM | POA: Diagnosis not present

## 2022-08-03 DIAGNOSIS — Z79899 Other long term (current) drug therapy: Secondary | ICD-10-CM | POA: Insufficient documentation

## 2022-08-03 DIAGNOSIS — Z91040 Latex allergy status: Secondary | ICD-10-CM | POA: Diagnosis not present

## 2022-08-03 MED ORDER — LIDOCAINE HCL (PF) 1 % IJ SOLN
5.0000 mL | Freq: Once | INTRAMUSCULAR | Status: AC
Start: 2022-08-03 — End: 2022-08-03
  Administered 2022-08-03: 5 mL
  Filled 2022-08-03: qty 5

## 2022-08-03 MED ORDER — OXYCODONE-ACETAMINOPHEN 5-325 MG PO TABS
1.0000 | ORAL_TABLET | Freq: Once | ORAL | Status: AC
  Administered 2022-08-03: 1 via ORAL
  Filled 2022-08-03: qty 1

## 2022-08-03 NOTE — ED Notes (Signed)
Pt wound to lt hand has a dx with bleeding controlled. Waiting on provider to undress the wound

## 2022-08-03 NOTE — ED Notes (Signed)
Provider at bedside at this time to repair wound

## 2022-08-03 NOTE — Discharge Instructions (Addendum)
It was a pleasure taking care of you today!   You may return to urgent care or return to the emergency department for suture removal in 7-10 days.  Keep the area clean and dry.  Return to the emergency department if worsening or persistent pain, drainage of wound, increased swelling, or color change to area.
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
## 2022-08-03 NOTE — ED Provider Notes (Signed)
[facility]PART[facility] Provider Note   CSN: [id number] Arrival date & time: 08/03/22  1918     History  Chief Complaint  Patient presents with   Laceration    [name] is a 66 y.o. female who presents to the ED complaining of left hand laceration onset PTA. Pt notes that she was trying to remove something from a salt shaker with a knife when she accidentally cut herself. Notes that her tetanus should be UTD. No anticoagulants. No meds PTA. Denies swelling, hand pain, finger pain.  The history is provided by the patient. No language interpreter was used.       Home Medications Prior to Admission medications   Medication Sig Start Date End Date Taking? Authorizing Provider  acetaminophen (TYLENOL) 500 MG tablet Take 1,000 mg by mouth daily as needed for moderate pain or headache.    [provider]  albuterol (PROVENTIL HFA;VENTOLIN HFA) 108 (90 Base) MCG/ACT inhaler Inhale 2 puffs into the lungs every 6 (six) hours as needed for wheezing or shortness of breath. 12/22/18   Kathi Ludwig, MD  aspirin EC 81 MG tablet Take 81 mg by mouth daily.    [provider]  Cholecalciferol (VITAMIN D HIGH POTENCY) 25 MCG (1000 UT) capsule Take 1,000 Units by mouth daily.    [provider]  diclofenac Sodium (VOLTAREN) 1 % GEL Apply 2 g topically 4 (four) times daily. 06/16/20   Axel Filler, MD  esomeprazole (NEXIUM) 20 MG capsule Take 20-40 mg by mouth daily as needed (acid reflux).    [provider]  folic acid (FOLVITE) 1 MG tablet Take 1 tablet (1 mg total) by mouth daily. 04/21/22   Collier [place], MD  losartan (COZAAR) 100 MG tablet TAKE 1 TABLET BY MOUTH EVERY DAY 02/16/22   Axel Filler, MD  methotrexate (RHEUMATREX) 2.5 MG tablet Take 4 tablets (10 mg total) by mouth once a week. Caution:Chemotherapy. Protect from light. Patient not taking: Reported on 04/28/2022 04/21/22   Collier [place],  MD  metoprolol tartrate (LOPRESSOR) 25 MG tablet TAKE 1 AND 1/2 TABLETS BY MOUTH TWICE A DAY 03/09/22   Hilty, Nadean Corwin, MD  Multiple Vitamin (MULTIVITAMIN WITH MINERALS) TABS tablet Take 1 tablet by mouth every Monday, Wednesday, and Friday.    [provider]  nystatin cream (MYCOSTATIN) Apply 1 application  topically 2 (two) times daily as needed (rash). 05/03/22   Axel Filler, MD  Polyethyl Glycol-Propyl Glycol (SYSTANE FREE OP) Apply 1 tablet to eye daily as needed (For dry eyes).    [provider]  rosuvastatin (CRESTOR) 10 MG tablet Take 1 tablet (10 mg total) by mouth daily. 01/12/22   Axel Filler, MD      Allergies    Cephalosporins, Eicosapentaenoic acid (epa), Fish-derived products, Peanuts [peanut oil], Shrimp [shellfish allergy], Latex, Metronidazole, Ciprofloxacin, Lisinopril, Naproxen, Other, and Prednisone    Review of Systems   Review of Systems  Musculoskeletal:  Negative for arthralgias and joint swelling.  Skin:  Positive for wound. Negative for color change.  All other systems reviewed and are negative.   Physical Exam Updated Vital Signs BP (!) 177/67 (BP Location: Right Arm)   Pulse (!) 58   Temp 98.5 F (36.9 C) (Oral)   Resp 16   Ht '5\' 8"'$  (1.727 m)   Wt 123.4 kg   LMP 01/13/2014 Comment: spotting in Feb and bleeding since April  SpO2 96%   BMI 41.36  kg/m  Physical Exam Vitals and nursing note reviewed.  Constitutional:      General: She is not in acute distress.    Appearance: Normal appearance. She is not ill-appearing.  HENT:     Head: Normocephalic and atraumatic.     Right Ear: External ear normal.     Left Ear: External ear normal.  Eyes:     General: No scleral icterus. Cardiovascular:     Rate and Rhythm: Normal rate.  Pulmonary:     Effort: Pulmonary effort is normal.  Musculoskeletal:        General: Normal range of motion.     Cervical back: Normal range of motion and neck supple.     Comments:  Normal finger to thumb opposition. Capillary refill intact. Normal flexion and extension of left thumb without difficulty against resistance.  Skin:    General: Skin is warm and dry.     Capillary Refill: Capillary refill takes less than 2 seconds.     Findings: Laceration present.     Comments: ~1.5 cm laceration noted to palmar aspect of left hand to interweb space between 1st and 2nd digits.  Neurological:     Mental Status: She is alert.     ED Results / Procedures / Treatments   Labs (all labs ordered are listed, but only abnormal results are displayed) Labs Reviewed - No data to display  EKG None  Radiology No results found.  Procedures .Marland KitchenLaceration Repair  Date/Time: 08/03/2022 10:25 PM  Performed by: Steva Colder A, PA-C Authorized by: Nehemiah Settle, PA-C   Consent:    Consent obtained:  Verbal   Consent given by:  Patient   Risks discussed:  Infection and pain Universal protocol:    Patient identity confirmed:  Verbally with patient and hospital-assigned identification number Anesthesia:    Anesthesia method:  Local infiltration   Local anesthetic:  Lidocaine 1% w/o epi Laceration details:    Location:  Hand   Hand location:  L palm   Length (cm):  1.5 Pre-procedure details:    Preparation:  Patient was prepped and draped in usual sterile fashion Exploration:    Hemostasis achieved with:  Direct pressure   Imaging outcome: foreign body not noted     Wound exploration: entire depth of wound visualized   Treatment:    Area cleansed with:  Povidone-iodine and saline   Amount of cleaning:  Standard   Irrigation solution:  Sterile saline   Irrigation method:  Syringe Skin repair:    Repair method:  Sutures   Suture size:  5-0   Suture material:  Prolene   Suture technique:  Simple interrupted   Number of sutures:  3 Approximation:    Approximation:  Close Repair type:    Repair type:  Simple Post-procedure details:    Dressing:  Non-adherent  dressing and adhesive bandage   Procedure completion:  Tolerated well, no immediate complications     Medications Ordered in ED Medications  lidocaine (PF) (XYLOCAINE) 1 % injection 5 mL (5 mLs Infiltration Given 08/03/22 2159)    ED Course/ Medical Decision Making/ A&P                           Medical Decision Making Risk Prescription drug management.   Patient presents with laceration noted to left hand prior to arrival. Pt is not on anticoagulants at this time. Vital signs, patient afebrile. On exam, patient with ~1.5 cm laceration  noted to palmar aspect of left hand to interweb space between 1st and 2nd digits. Normal finger to thumb opposition. Capillary refill intact. Normal flexion and extension of left thumb without difficulty against resistance. Tetanus up-to-date. Laceration occurred < 12 hours prior to repair. Differential diagnosis includes, fracture, foreign body, dislocation, avulsion.   Disposition: Presenting suspicious for laceration. Doubt fracture, dislocation, or foreign body at this time. Tetanus up up-to-date as of February 23, 2021. Wound thoroughly irrigated, no foreign bodies noted. Laceration repaired in the ED today. After consideration of the diagnostic results and the patients response to treatment, I feel that the patient would benefit from Discharge home. Hypertensive at discharge, pt notes that she hasn't taken her blood pressure medications tonight. Discussed laceration care with pt and answered questions. Pt to follow up for suture/staple removal in 7-10 days and wound check sooner should there be signs of dehiscence or infection. Pt is hemodynamically stable with no complaints prior to discharge. Supportive care measures and strict return precautions discussed with patient at bedside. Pt acknowledges and verbalizes understanding. Pt appears safe for discharge. Follow up as indicated in discharge paperwork.    This chart was dictated using voice recognition  software, Dragon. Despite the best efforts of this provider to proofread and correct errors, errors may still occur which can change documentation meaning.   Final Clinical Impression(s) / ED Diagnoses Final diagnoses:  Laceration of left hand without foreign body, initial encounter    Rx / DC Orders ED Discharge Orders     None         Evah Rashid A, PA-C 08/04/22 0003    Kommor, Debe Coder, MD 08/05/22 2320

## 2022-08-03 NOTE — ED Notes (Signed)
Wound soaking in betadine and sterile water at this time per providers request

## 2022-08-03 NOTE — ED Notes (Signed)
Pt ambulated to restroom at this time with a steady gait and assistance

## 2022-08-03 NOTE — ED Triage Notes (Signed)
Pt was cutting something and slipped causing knife to puncture palm between thumb and 1st finger. Bleeding controlled PTA. Sensation and cap refill good to thumb and first finger.  Pt is unsure of last tetanus.

## 2022-08-04 ENCOUNTER — Ambulatory Visit: Payer: Medicare Other | Admitting: Obstetrics and Gynecology

## 2022-08-04 NOTE — Progress Notes (Deleted)
[facility] Return Visit  SUBJECTIVE  History of Present Illness: [name] is a 66 y.o. female seen in follow-up for prolapse and incontinence. She underwent urodynamic testing.   Pessary has been working well. It has not fallen out in a month. She is had some trouble putting in the pessary by herself due to arthritis. She has been feeling a "ball" in the vagina.    Past Medical History: Patient  has a past medical history of Acute urinary retention s/p Foley (01/07/2012), Allergy, Anemia, Asthma, CAD (coronary artery disease), Carotid arterial disease ([place]), Carpal tunnel syndrome, bilateral, GERD (gastroesophageal reflux disease), Hemorrhoids, internal, with bleeding & prolapse (12/13/2011), Hernia, abdominal, History of blood transfusion, History of kidney stones, Hyperlipidemia, Hypertension, Iron deficiency (12/07/2011), Keloid scar, Morbid obesity ([place]), Myocardial infarct ([place]), Neuropathy, Osteoarthritis, Osteoporosis, PONV (postoperative nausea and vomiting), Seasonal allergies, Sleep apnea, Tooth abscess, and Uterine cancer ([place]) (05/2014).   Past Surgical History: She  has a past surgical history that includes Knee surgery (Bilateral, 1999); Total knee arthroplasty (Left, 04/29/2011); Wisdom tooth extraction; Colonoscopy (2009); Knee arthroscopy (1991); transanal hemorrhoidal dearterliaization (01/06/2012); Coronary artery bypass graft (N/A, 01/09/2014); Intraoprative transesophageal echocardiogram (N/A, 01/09/2014); TEE without cardioversion (01/2010); NM MYOCAR PERF WALL MOTION (01/2010); Cardiac catheterization; Multiple extractions with alveoloplasty (N/A, 04/11/2014); Vaginal hysterectomy (N/A, 06/03/2014); Unilateral salpingectomy (Right, 06/03/2014); left heart catheterization with coronary angiogram (N/A, 01/04/2014); Breast reduction surgery (Bilateral, 10/10/2015); Colonoscopy w/ biopsies and polypectomy; Lesion excision with complex repair (N/A, 05/07/2016);  Cataract extraction (Right); Artery Biopsy (Left, 07/10/2021); and bladder tack.   Medications: She has a current medication list which includes the following prescription(s): acetaminophen, albuterol, aspirin ec, vitamin d high potency, diclofenac sodium, esomeprazole, folic acid, losartan, methotrexate, metoprolol tartrate, multivitamin with minerals, nystatin cream, polyethyl glycol-propyl glycol, and rosuvastatin.   Allergies: Patient is allergic to cephalosporins, eicosapentaenoic acid (epa), fish-derived products, peanuts [peanut oil], shrimp [shellfish allergy], latex, metronidazole, ciprofloxacin, lisinopril, naproxen, other, and prednisone.   Social History: Patient  reports that she has never smoked. She has never used smokeless tobacco. She reports that she does not drink alcohol and does not use drugs.      OBJECTIVE     Physical Exam: There were no vitals filed for this visit.  Gen: No apparent distress, A&O x 3.  Detailed Urogynecologic Evaluation:  Pessary noted to be in place but anterior vaginal prolapse present around the pessary. Pessary was removed. Speculum exam revealed no vaginal lesions. She was fit with a #3 cube pessary (Lot#[id number]). It was comfortable, fit well, and stayed in placed with strong cough, valsalva and bending.    Prior exam showed: POP-Q:    POP-Q   3                                            Aa   3                                           Ba   -7                                              C  7                                            Gh   3.5                                            Pb   9                                            tvl    -3                                            Ap   -3                                            Bp                                                  D        ASSESSMENT AND PLAN    [name] is a 66 y.o. with:  No diagnosis found.   - Fit with a #3 cube pessary. Also gave  her prior shaatz pessary to keep in case this new pessary does not work out well.   Return 3 months for pessary cleaning or sooner if needed.   [name], MD

## 2022-08-05 ENCOUNTER — Telehealth: Payer: Self-pay

## 2022-08-05 NOTE — Patient Outreach (Signed)
  Care Coordination TOC Note Transition Care Management Follow-up Telephone Call Date of discharge and from where: [name] 08/03/22 How have you been since you were released from the hospital? "My hand is doing pretty fair"'. Not getting infected. Any questions or concerns? No  Items Reviewed: Did the pt receive and understand the discharge instructions provided? Yes  Medications obtained and verified? Yes  Other? No  Any new allergies since your discharge? No  Dietary orders reviewed? Yes Do you have support at home?  Family intermittently  Home Care and Equipment/Supplies: Were home health services ordered? no If so, what is the name of the agency? N/A  Has the agency set up a time to come to the patient's home? not applicable Were any new equipment or medical supplies ordered?  No What is the name of the medical supply agency? N/A Were you able to get the supplies/equipment? not applicable Do you have any questions related to the use of the equipment or supplies? No  Functional Questionnaire: (I = Independent and D = Dependent) ADLs: I  Bathing/Dressing- I  Meal Prep- I  Eating- I  Maintaining continence- I  Transferring/Ambulation- I  Managing Meds- I  Follow up appointments reviewed:  PCP Hospital f/u appt confirmed? No   Specialist Hospital f/u appt confirmed?  Not indicated   Are transportation arrangements needed? No  If their condition worsens, is the pt aware to call PCP or go to the Emergency Dept.? Yes Was the patient provided with contact information for the PCP's office or ED? Yes Was to pt encouraged to call back with questions or concerns? Yes  SDOH assessments and interventions completed:   Yes  Care Coordination Interventions Activated:  Yes   Care Coordination Interventions:  PCP follow up appointment requested    Encounter Outcome:  Pt. Visit Completed

## 2022-08-09 ENCOUNTER — Ambulatory Visit: Payer: Medicare Other

## 2022-08-09 ENCOUNTER — Encounter: Payer: Medicare Other | Admitting: Internal Medicine

## 2022-08-13 ENCOUNTER — Other Ambulatory Visit: Payer: Self-pay | Admitting: Internal Medicine

## 2022-08-13 ENCOUNTER — Ambulatory Visit (INDEPENDENT_AMBULATORY_CARE_PROVIDER_SITE_OTHER): Payer: Medicare Other | Admitting: Internal Medicine

## 2022-08-13 ENCOUNTER — Other Ambulatory Visit: Payer: Self-pay

## 2022-08-13 DIAGNOSIS — S61219D Laceration without foreign body of unspecified finger without damage to nail, subsequent encounter: Secondary | ICD-10-CM | POA: Insufficient documentation

## 2022-08-13 DIAGNOSIS — Z1231 Encounter for screening mammogram for malignant neoplasm of breast: Secondary | ICD-10-CM

## 2022-08-13 NOTE — Assessment & Plan Note (Addendum)
Patient seen today for suture removal. She reports injury on 9/12 when she was trying to open something with a knife. The knife slipped and she cut the space between her thumb and first digit. She was seen in ED and 3 sutures were placed. She has full range of motion of thumb and pain has improved. On exam laceration is well healed with no surrounding erythema. Sutures have been in place for 10 days. P: 3 sutures removed from hand without complication.

## 2022-08-13 NOTE — Patient Instructions (Signed)
Thank you, Ms.Mycah A Moorman for allowing [place] to provide your care today.   I am glad your hand healed up so well. Please keep covered while using can over the next few days to help with soreness and it finishes healing.  We look forward to seeing you next time. Please call our clinic at [phone] if you have any questions or concerns. The best time to call is Monday-Friday from 9am-4pm, but there is someone available 24/7. If after hours or the weekend, call the main hospital number and ask for the Internal Medicine Resident On-Call. If you need medication refills, please notify your pharmacy one week in advance and they will send [place] a request.   Thank you for trusting me with your care. Wishing you the best!   Christiana Fuchs, [place]

## 2022-08-13 NOTE — Progress Notes (Signed)
Subjective:  CC: suture removal  HPI:  [name] is a 66 y.o. female with a past medical history stated below and presents today for suture removal. These were placed 9/12. Please see problem based assessment and plan for additional details.  Past Medical History:  Diagnosis Date   Acute urinary retention s/p Foley 01/07/2012   Allergy    Anemia    Asthma    CAD (coronary artery disease)    a. 12/2013 s/p CABG x 5 Dr. Darcey Griffin (LIMA to LAD, SVG to diagonal, SVG to OM1, SVG to OM2, SVG to PDA)   Carotid arterial disease ([place])    a. 12/2013 Carotid U/S: 1-39% bilat ICA stenosis.   Carpal tunnel syndrome, bilateral    GERD (gastroesophageal reflux disease)    Hemorrhoids, internal, with bleeding & prolapse 12/13/2011   Hernia, abdominal    History of blood transfusion    CABG and hysterectomy   History of kidney stones    Hyperlipidemia    Hypertension    Iron deficiency 12/07/2011   Keloid scar    a. Sternal Keloid s/p CABG with ongoing pain.   Morbid obesity ([place])    Myocardial infarct ([place])    2015   Neuropathy    Osteoarthritis    Osteoporosis    PONV (postoperative nausea and vomiting)    Seasonal allergies    Sleep apnea    no CPAP machine; sleep study 02/2010 REM AHI 61.7/hr, total sleep REM 14.8/hr   Tooth abscess    Uterine cancer ([place]) 05/2014   clinical stage IA grade 1 endometrioid endometrial cancer    Current Outpatient Medications on File Prior to Visit  Medication Sig Dispense Refill   acetaminophen (TYLENOL) 500 MG tablet Take 1,000 mg by mouth daily as needed for moderate pain or headache.     albuterol (PROVENTIL HFA;VENTOLIN HFA) 108 (90 Base) MCG/ACT inhaler Inhale 2 puffs into the lungs every 6 (six) hours as needed for wheezing or shortness of breath. 1 Inhaler 0   aspirin EC 81 MG tablet Take 81 mg by mouth daily.     Cholecalciferol (VITAMIN D HIGH POTENCY) 25 MCG (1000 UT) capsule Take 1,000 Units by mouth daily.     diclofenac  Sodium (VOLTAREN) 1 % GEL Apply 2 g topically 4 (four) times daily. 150 g 2   esomeprazole (NEXIUM) 20 MG capsule Take 20-40 mg by mouth daily as needed (acid reflux).     folic acid (FOLVITE) 1 MG tablet Take 1 tablet (1 mg total) by mouth daily. 90 tablet 0   losartan (COZAAR) 100 MG tablet TAKE 1 TABLET BY MOUTH EVERY DAY 90 tablet 3   methotrexate (RHEUMATREX) 2.5 MG tablet Take 4 tablets (10 mg total) by mouth once a week. Caution:Chemotherapy. Protect from light. (Patient not taking: Reported on 04/28/2022) 20 tablet 0   metoprolol tartrate (LOPRESSOR) 25 MG tablet TAKE 1 AND 1/2 TABLETS BY MOUTH TWICE A DAY 270 tablet 1   Multiple Vitamin (MULTIVITAMIN WITH MINERALS) TABS tablet Take 1 tablet by mouth every Monday, Wednesday, and Friday.     nystatin cream (MYCOSTATIN) Apply 1 application  topically 2 (two) times daily as needed (rash). 30 g 1   Polyethyl Glycol-Propyl Glycol (SYSTANE FREE OP) Apply 1 tablet to eye daily as needed (For dry eyes).     rosuvastatin (CRESTOR) 10 MG tablet Take 1 tablet (10 mg total) by mouth daily. 90 tablet 3   No current facility-administered medications on file prior  to visit.    Family History  Problem Relation Age of Onset   Heart attack Mother 14   Heart disease Mother    Hypertension Mother    Diabetes Mother    Hypertension Sister    Heart failure Sister    Heart disease Sister    Diabetes Sister    Kidney disease Sister    Kidney disease Brother    Hypertension Daughter    Bipolar disorder Son    Schizophrenia Son    Anxiety disorder Son    Esophageal cancer Other        nephew   Colon cancer Neg Hx    Pancreatic cancer Neg Hx    Stomach cancer Neg Hx    Liver disease Neg Hx    Rectal cancer Neg Hx     Social History   Socioeconomic History   Marital status: Single    Spouse name: Not on file   Number of children: 2   Years of education: Not on file   Highest education level: Not on file  Occupational History   Occupation:  Retired    Fish farm manager: [employer]    Comment: Chiropodist  Tobacco Use   Smoking status: Never   Smokeless tobacco: Never  Vaping Use   Vaping Use: Never used  Substance and Sexual Activity   Alcohol use: No    Alcohol/week: 0.0 standard drinks of alcohol   Drug use: No   Sexual activity: Not Currently  Other Topics Concern   Not on file  Social History Narrative   Current Social History 08/27/2020        Patient lives alone in a handicap-accessible ground floor apartment which is 1 story. There are not steps up to the entrance the patient uses.       Patient's method of transportation is personal car or family member.      The highest level of education was 2 years of college.      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 [employer]      Right Handed      Identified important Relationships are My grandchildren, daughter, son       Pets : None       Interests / Fun: Plants       Current Stressors: Son with special needs (Bi-polar/schizophrenia) , he is not living with her        Religious / Personal Beliefs: You Tube mentors (Vegan videos)      [place], RN,BSN          Social Determinants of Health   Financial Resource Strain: Low Risk  (07/26/2021)   Overall Financial Resource Strain (CARDIA)    Difficulty of Paying Living Expenses: Not hard at all  Food Insecurity: No Food Insecurity (07/26/2021)   Hunger Vital Sign    Worried About Running Out of Food in the Last Year: Never true    [place] in the Last Year: Never true  Transportation Needs: No Transportation Needs (08/05/2022)   PRAPARE - Hydrologist (Medical): No    Lack of Transportation (Non-Medical): No  Physical Activity: Insufficiently Active (07/26/2021)   Exercise Vital Sign    Days of Exercise per Week: 3 days    Minutes of Exercise per Session: 20 min  Stress: Not on file  Social Connections: Not on file  Intimate Partner  Violence: Not on file    Review of Systems: ROS  negative except for what is noted on the assessment and plan.  Objective:   Vitals:   08/13/22 1102 08/13/22 1113  BP: (!) 140/73 136/64  Pulse: (!) 52 (!) 49  Resp: (!) 32   Temp: 98.2 F (36.8 C)   TempSrc: Oral   SpO2: 100%   Height: '5\' 8"'$  (1.727 m)     Physical Exam: Constitutional: well-appearing, in no acute distress MSK: well healed laceration to left hand. Neurological: alert & oriented x 3, using can to ambualte Skin: warm and dry   Assessment & Plan:  Laceration of finger of left hand without damage to nail, subsequent encounter Patient seen today for suture removal. She reports injury on 9/12 when she was trying to open something with a knife. The knife slipped and she cut the space between her thumb and first digit. She was seen in ED and 3 sutures were placed. She has full range of motion of thumb and pain has improved. On exam laceration is well healed with no surrounding erythema. Sutures have been in place for 10 days. P: 3 sutures removed from hand without complication.    Patient discussed with Dr. Ennis Forts Fredrik Mogel, D.O. [facility]  PGY-2 Pager: [phone]  Phone: [phone] Date 08/13/2022  Time 1:21 PM

## 2022-08-13 NOTE — Progress Notes (Signed)
Mammogram ordered placed

## 2022-08-18 NOTE — Progress Notes (Signed)
Internal Medicine Clinic Attending  Case discussed with Dr. Masters  At the time of the visit.  We reviewed the resident's history and exam and pertinent patient test results.  I agree with the assessment, diagnosis, and plan of care documented in the resident's note.  

## 2022-08-20 ENCOUNTER — Other Ambulatory Visit: Payer: Self-pay

## 2022-08-20 DIAGNOSIS — Z79899 Other long term (current) drug therapy: Secondary | ICD-10-CM

## 2022-08-21 LAB — COMPLETE METABOLIC PANEL WITH GFR
AG Ratio: 1.3 (calc) (ref 1.0–2.5)
ALT: 6 U/L (ref 6–29)
AST: 11 U/L (ref 10–35)
Albumin: 4 g/dL (ref 3.6–5.1)
Alkaline phosphatase (APISO): 46 U/L (ref 37–153)
BUN: 13 mg/dL (ref 7–25)
CO2: 30 mmol/L (ref 20–32)
Calcium: 9.6 mg/dL (ref 8.6–10.4)
Chloride: 104 mmol/L (ref 98–110)
Creat: 0.77 mg/dL (ref 0.50–1.05)
Globulin: 3.2 g/dL (calc) (ref 1.9–3.7)
Glucose, Bld: 98 mg/dL (ref 65–99)
Potassium: 4.4 mmol/L (ref 3.5–5.3)
Sodium: 141 mmol/L (ref 135–146)
Total Bilirubin: 0.3 mg/dL (ref 0.2–1.2)
Total Protein: 7.2 g/dL (ref 6.1–8.1)
eGFR: 84 mL/min/{1.73_m2} (ref >=60)

## 2022-08-21 LAB — CBC WITH DIFFERENTIAL/PLATELET
Absolute Monocytes: 510 cells/uL (ref 200–950)
Basophils Absolute: 22 cells/uL (ref 0–200)
Basophils Relative: 0.4 %
Eosinophils Absolute: 151 cells/uL (ref 15–500)
Eosinophils Relative: 2.7 %
HCT: 40 % (ref 35.0–45.0)
Hemoglobin: 13.1 g/dL (ref 11.7–15.5)
Lymphs Abs: 2302 cells/uL (ref 850–3900)
MCH: 28.4 pg (ref 27.0–33.0)
MCHC: 32.8 g/dL (ref 32.0–36.0)
MCV: 86.6 fL (ref 80.0–100.0)
MPV: 10.1 fL (ref 7.5–12.5)
Monocytes Relative: 9.1 %
Neutro Abs: 2615 cells/uL (ref 1500–7800)
Neutrophils Relative %: 46.7 %
Platelets: 209 10*3/uL (ref 140–400)
RBC: 4.62 10*6/uL (ref 3.80–5.10)
RDW: 14.5 % (ref 11.0–15.0)
Total Lymphocyte: 41.1 %
WBC: 5.6 10*3/uL (ref 3.8–10.8)

## 2022-09-06 ENCOUNTER — Other Ambulatory Visit: Payer: Self-pay | Admitting: Internal Medicine

## 2022-09-10 ENCOUNTER — Ambulatory Visit: Payer: Medicare Other | Admitting: Obstetrics and Gynecology

## 2022-09-10 NOTE — Progress Notes (Deleted)
[facility] Return Visit  SUBJECTIVE  History of Present Illness: [name] is a 66 y.o. female seen in follow-up for prolapse and incontinence. She underwent urodynamic testing.   Pessary has been working well. It has not fallen out in a month. She is had some trouble putting in the pessary by herself due to arthritis. She has been feeling a "ball" in the vagina.    Past Medical History: Patient  has a past medical history of Acute urinary retention s/p Foley (01/07/2012), Allergy, Anemia, Asthma, CAD (coronary artery disease), Carotid arterial disease ([place]), Carpal tunnel syndrome, bilateral, GERD (gastroesophageal reflux disease), Hemorrhoids, internal, with bleeding & prolapse (12/13/2011), Hernia, abdominal, History of blood transfusion, History of kidney stones, Hyperlipidemia, Hypertension, Iron deficiency (12/07/2011), Keloid scar, Morbid obesity ([place]), Myocardial infarct ([place]), Neuropathy, Osteoarthritis, Osteoporosis, PONV (postoperative nausea and vomiting), Seasonal allergies, Sleep apnea, Tooth abscess, and Uterine cancer ([place]) (05/2014).   Past Surgical History: She  has a past surgical history that includes Knee surgery (Bilateral, 1999); Total knee arthroplasty (Left, 04/29/2011); Wisdom tooth extraction; Colonoscopy (2009); Knee arthroscopy (1991); transanal hemorrhoidal dearterliaization (01/06/2012); Coronary artery bypass graft (N/A, 01/09/2014); Intraoprative transesophageal echocardiogram (N/A, 01/09/2014); TEE without cardioversion (01/2010); NM MYOCAR PERF WALL MOTION (01/2010); Cardiac catheterization; Multiple extractions with alveoloplasty (N/A, 04/11/2014); Vaginal hysterectomy (N/A, 06/03/2014); Unilateral salpingectomy (Right, 06/03/2014); left heart catheterization with coronary angiogram (N/A, 01/04/2014); Breast reduction surgery (Bilateral, 10/10/2015); Colonoscopy w/ biopsies and polypectomy; Lesion excision with complex repair (N/A, 05/07/2016);  Cataract extraction (Right); Artery Biopsy (Left, 07/10/2021); and bladder tack.   Medications: She has a current medication list which includes the following prescription(s): acetaminophen, albuterol, aspirin ec, vitamin d high potency, diclofenac sodium, esomeprazole, folic acid, losartan, methotrexate, metoprolol tartrate, multivitamin with minerals, nystatin cream, polyethyl glycol-propyl glycol, and rosuvastatin.   Allergies: Patient is allergic to cephalosporins, eicosapentaenoic acid (epa), fish-derived products, peanuts [peanut oil], shrimp [shellfish allergy], latex, metronidazole, ciprofloxacin, lisinopril, naproxen, other, and prednisone.   Social History: Patient  reports that she has never smoked. She has never used smokeless tobacco. She reports that she does not drink alcohol and does not use drugs.      OBJECTIVE     Physical Exam: There were no vitals filed for this visit.  Gen: No apparent distress, A&O x 3.  Detailed Urogynecologic Evaluation:  Pessary noted to be in place but anterior vaginal prolapse present around the pessary. Pessary was removed. Speculum exam revealed no vaginal lesions. She was fit with a #3 cube pessary (Lot#[id number]). It was comfortable, fit well, and stayed in placed with strong cough, valsalva and bending.    Prior exam showed: POP-Q:    POP-Q   3                                            Aa   3                                           Ba   -7                                              C  7                                            Gh   3.5                                            Pb   9                                            tvl    -3                                            Ap   -3                                            Bp                                                  D        ASSESSMENT AND PLAN    Ms. Hiers is a 66 y.o. with:  No diagnosis found.   - Fit with a #3 cube pessary. Also gave  her prior shaatz pessary to keep in case this new pessary does not work out well.   Return 3 months for pessary cleaning or sooner if needed.   [name], MD

## 2022-09-14 ENCOUNTER — Encounter: Payer: Self-pay | Admitting: Obstetrics and Gynecology

## 2022-09-14 ENCOUNTER — Ambulatory Visit (INDEPENDENT_AMBULATORY_CARE_PROVIDER_SITE_OTHER): Payer: Medicare Other | Admitting: Obstetrics and Gynecology

## 2022-09-14 VITALS — BP 181/81 | HR 50

## 2022-09-14 DIAGNOSIS — N8110 Cystocele, unspecified: Secondary | ICD-10-CM

## 2022-09-14 DIAGNOSIS — N393 Stress incontinence (female) (male): Secondary | ICD-10-CM

## 2022-09-14 NOTE — Progress Notes (Signed)
[facility] Return Visit  SUBJECTIVE  History of Present Illness: [name] is a 66 y.o. female seen in follow-up for prolapse and incontinence.   Cube Pessary fell out when patient went to the bathroom at home and she reports she re-inserted her previous Shaatz pessary but notices that there is more of a bulge.     Past Medical History: Patient  has a past medical history of Acute urinary retention s/p Foley (01/07/2012), Allergy, Anemia, Asthma, CAD (coronary artery disease), Carotid arterial disease ([place]), Carpal tunnel syndrome, bilateral, GERD (gastroesophageal reflux disease), Hemorrhoids, internal, with bleeding & prolapse (12/13/2011), Hernia, abdominal, History of blood transfusion, History of kidney stones, Hyperlipidemia, Hypertension, Iron deficiency (12/07/2011), Keloid scar, Morbid obesity ([place]), Myocardial infarct ([place]), Neuropathy, Osteoarthritis, Osteoporosis, PONV (postoperative nausea and vomiting), Seasonal allergies, Sleep apnea, Tooth abscess, and Uterine cancer ([place]) (05/2014).   Past Surgical History: She  has a past surgical history that includes Knee surgery (Bilateral, 1999); Total knee arthroplasty (Left, 04/29/2011); Wisdom tooth extraction; Colonoscopy (2009); Knee arthroscopy (1991); transanal hemorrhoidal dearterliaization (01/06/2012); Coronary artery bypass graft (N/A, 01/09/2014); Intraoprative transesophageal echocardiogram (N/A, 01/09/2014); TEE without cardioversion (01/2010); NM MYOCAR PERF WALL MOTION (01/2010); Cardiac catheterization; Multiple extractions with alveoloplasty (N/A, 04/11/2014); Vaginal hysterectomy (N/A, 06/03/2014); Unilateral salpingectomy (Right, 06/03/2014); left heart catheterization with coronary angiogram (N/A, 01/04/2014); Breast reduction surgery (Bilateral, 10/10/2015); Colonoscopy w/ biopsies and polypectomy; Lesion excision with complex repair (N/A, 05/07/2016); Cataract extraction (Right); Artery Biopsy (Left,  07/10/2021); and bladder tack.   Medications: She has a current medication list which includes the following prescription(s): acetaminophen, albuterol, aspirin ec, vitamin d high potency, diclofenac sodium, esomeprazole, folic acid, losartan, methotrexate, metoprolol tartrate, multivitamin with minerals, nystatin cream, polyethyl glycol-propyl glycol, and rosuvastatin.   Allergies: Patient is allergic to cephalosporins, eicosapentaenoic acid (epa), fish-derived products, peanuts [peanut oil], shrimp [shellfish allergy], latex, metronidazole, ciprofloxacin, lisinopril, naproxen, other, and prednisone.   Social History: Patient  reports that she has never smoked. She has never used smokeless tobacco. She reports that she does not drink alcohol and does not use drugs.      OBJECTIVE     Physical Exam: Vitals:   09/14/22 1039  BP: (!) 181/81  Pulse: (!) 50   Gen: No apparent distress, A&O x 3.  Detailed Urogynecologic Evaluation:  Pessary noted to be in place but anterior vaginal prolapse present around the pessary. Pessary was removed. Speculum exam revealed no vaginal lesions. She was fit with a #5 cube pessary. It was comfortable, fit well, and stayed in placed with strong cough, valsalva and bending. Patient was able to go to the bathroom and urinate without pessary falling out. Denied pain or discomfort with pessary. Re-exam following urination showed pessary still in place   Prior exam showed: POP-Q:    POP-Q   3                                            Aa   3                                           Ba   -7  C    7                                            Gh   3.5                                            Pb   9                                            tvl    -3                                            Ap   -3                                            Bp                                                  D         ASSESSMENT AND PLAN    [name] is a 66 y.o. with:  1. Prolapse of anterior vaginal wall   2. SUI (stress urinary incontinence, female)       - Fit with a #5 cube pessary. Also gave her prior shaatz pessary and #3 Cube to keep. Patient reports interest and willingness to learn to take pessary in and out on her own.   Will attempt pessary teaching for home removal at next cleaning.   Return 3 months for pessary cleaning or sooner if needed.   [name], MD

## 2022-09-23 ENCOUNTER — Encounter (HOSPITAL_BASED_OUTPATIENT_CLINIC_OR_DEPARTMENT_OTHER): Payer: Self-pay | Admitting: Internal Medicine

## 2022-09-23 ENCOUNTER — Ambulatory Visit (INDEPENDENT_AMBULATORY_CARE_PROVIDER_SITE_OTHER): Payer: Medicare Other | Admitting: Internal Medicine

## 2022-09-23 VITALS — BP 150/84 | HR 53 | Ht 68.0 in | Wt 271.3 lb

## 2022-09-23 DIAGNOSIS — Z951 Presence of aortocoronary bypass graft: Secondary | ICD-10-CM

## 2022-09-23 DIAGNOSIS — E785 Hyperlipidemia, unspecified: Secondary | ICD-10-CM

## 2022-09-23 DIAGNOSIS — I10 Essential (primary) hypertension: Secondary | ICD-10-CM

## 2022-09-23 NOTE — Progress Notes (Signed)
OFFICE NOTE  Chief Complaint:  Routine follow-up  Primary Care Physician: Axel Filler, MD  HPI:  [name] is a 66 year old African American obese female presented to 01/01/14 with symptoms of unstable angina. I had seen her once in 2012, but not since then.  She recently had been having chest pain and presented urgently when she became short of breath at home and had chest tightness, which started as right arm pain and right chest pain, then moved substernally. She ultimately underwent cardiac catheterization via right radial artery which demonstrated severe three-vessel CAD with graftable target vessels. Echocardiogram showed good LV function with some left ventricular hypertrophy from her hypertension and there does not appear to be significant valve disease. Carotid dopplers showed a 1-39% bilateral carotid artery stenosis. She underwent a CABG x 5 on 01/09/2014. Left internal mammary artery to LAD, saphenous vein graft to diagonal, sequential saphenous vein graft to OM1 and OM2, saphenous vein graft to posterior descending. She had multiple teeth extractions prior to surgery. She did well post op. She was anemic but did not need transfusion. At discharge she went to SNF for rehab-she did not stay the recommended time frame due to BR not being available when she needed it. She was not given discharge medications when she left SNF. She saw Cecilie Kicks, FNP, in followup for her hospitalization and was restarted on some of her medications.   [name] was seen today in the office for follow-up. At her last office visit I cleared her for surgery and she underwent hysterectomy and has had resolution of her bleeding problems. She is now contemplating right knee surgery and also breast reduction surgery. She's in the interim seen in the hospital for palpitations. She reports those have improved with medication adjustment. She's decreased her Lasix now to every other day and I think she  can probably take it as needed. She denies any chest pain worsening shortness of breath. She is undergoing injections to reduce the size of her keloid scar.  I saw [name] back in the office today. She's been undergoing rehabilitation and really wants to continue it. She's managed to lose some weight and feels better. Recently though she's been having increased blood pressures. Does have ranged up to 967 systolic. She had a small increase in her lisinopril to 10 mg daily with an improvement however this staff does not want to continue her at rehabilitation until her blood pressure is better controlled. She denies any chest pain or palpitations.  Ms. Aslinger returns today for follow-up. She continues in cardiac rehabilitation and is doing well. At his last office visit I increased her lisinopril to 20 mg daily and her blood pressure is now much better controlled. Overall she is feeling well. She is undergoing cesarean injections of the keloid scar that she has in her bypass site. She is concerned about a dilated varicose vein that she's developed at the inferior margin.  Since I last saw her, she is doing well. She underwent breast reductions surgery, which has helped her CABG scar some, but has failed to lose significant weight. BMI is now over 50. She does get short of breath but not worsening. Blood pressure is well-controlled and cough has gone away after switching from ACE-I to ARB. She reports slightly more palpitations recently. She has episodes of palpitations, worse at night that occur 3-4x a month.  07/01/2016  [name] returns today for follow-up. She reports some left leg swelling. She has a  history of left knee surgery and has had swelling since that time. She occasionally gets some swelling in the right leg which is where her saphenous vein grafts were taken. She still reports numbness and tingling around the vein harvest sites. She has had numbness and tingling of the chest which is  exquisitely tender to palpation although she has a "high pain tolerance". She recently underwent revision of the keloid scar to her median sternotomy. That does appear somewhat improved. She denies any worsening shortness of breath. Weight is stable.  03/21/2017  [name] was seen today in follow-up. She recently saw Ignacia Bayley, NP, for evaluation of chest pain. Given her history of coronary artery disease and prior CABG, he was concerned about recurrent ischemia. He ordered a [employer] which she underwent and it turns out was negative for ischemia. I do think a lot of her pain is related to her anterior chest keloid scar. She's also having problems with her knees which aren't about the importance of weight loss today. Recently she's lost a little bit away however she is still at a BMI around 50. Recently her blood pressure is running somewhat higher and her PCP increased her irbesartan from 150 mg to 300 mg daily.  04/13/2018  [name] was seen today in routine follow-up.  Over the past year she is done well.  She denies any further chest pain.  She had had an episode not long after bypass and underwent repeat stress testing which was negative for ischemia.  She continues to have problems with a keloid scar over her median sternotomy.  She plans to see a dermatologist about this.  Blood pressure is been fairly well controlled.  Unfortunately her weight is not decreased significantly.  She has been having problems with arthritis and uses BC powder.  I recommended discontinuing that.  She is not taking daily aspirin which she should be and will start that.  She also uses Aleve and I recommended using that sparingly.  She should consider Tylenol as an alternative.  She had an EKG today showed sinus bradycardia at 54 with very flat T waves and a questionable long QTC of over 650 ms.  On my evaluation, is very difficult to see where the T wave and.  I am not certain that there is actually QT  prolongation, and she is not actually on medications that would cause that.   04/07/2020  [name] returns today for follow-up.  Overall she seems to be doing well.  She has never arthritic issues and walks with a cane.  She is recently had some weight loss and intends to get down to 225 later this year.  She denies any chest pain or worsening shortness of breath.  Her blood pressure is elevated today however she saw her PCP this morning and it was much better controlled.  She says she switch to a plant-based diet.  04/02/2021  [name] returns today for follow-up.  She continues to do well.  She is lost some additional weight.  She is sticking to a plant-based diet.  Blood pressure appears to be well controlled.  She still using a cane and having issues with her back and hips.  She wants to be more active.  I encouraged her to consider utilizing her membership at the [facility].  She is due for repeat lipid as her last cholesterol test was in 2021.  She denies any chest pain with exertion.  09/23/2022  [name]  returns today for follow-up.  Over the last year she has done well.  More recently her cholesterol had gone up about 8 months ago with total 202 and LDL 133.  She says she was off of her diet and has made some changes.  She is recently lost some more weight and her lipids show total cholesterol 147, triglycerides 89 HDL 53 and LDL 77.  She denies any chest pain or shortness of breath.  She has persistent discomfort over the keloid area of scar which has been an ongoing issue.  Blood pressure was a little elevated today but she says is better controlled at home.  She is compliant with her medications.  She denies any shortness of breath.  She is able to lay flat.  She denies any apneic symptoms.  PMHx:  Past Medical History:  Diagnosis Date   Acute urinary retention s/p Foley 01/07/2012   Allergy    Anemia    Asthma    CAD (coronary artery disease)    a. 12/2013 s/p CABG  x 5 Dr. Darcey Nora (LIMA to LAD, SVG to diagonal, SVG to OM1, SVG to OM2, SVG to PDA)   Carotid arterial disease ([place])    a. 12/2013 Carotid U/S: 1-39% bilat ICA stenosis.   Carpal tunnel syndrome, bilateral    GERD (gastroesophageal reflux disease)    Hemorrhoids, internal, with bleeding & prolapse 12/13/2011   Hernia, abdominal    History of blood transfusion    CABG and hysterectomy   History of kidney stones    Hyperlipidemia    Hypertension    Iron deficiency 12/07/2011   Keloid scar    a. Sternal Keloid s/p CABG with ongoing pain.   Morbid obesity ([place])    Myocardial infarct ([place])    2015   Neuropathy    Osteoarthritis    Osteoporosis    PONV (postoperative nausea and vomiting)    Seasonal allergies    Sleep apnea    no CPAP machine; sleep study 02/2010 REM AHI 61.7/hr, total sleep REM 14.8/hr   Tooth abscess    Uterine cancer ([place]) 05/2014   clinical stage IA grade 1 endometrioid endometrial cancer    Past Surgical History:  Procedure Laterality Date   ARTERY BIOPSY Left 07/10/2021   Procedure: LEFT TEMPORAL ARTERY BIOPSY;  Surgeon: Serafina Mitchell, MD;  Location: [place];  Service: Vascular;  Laterality: Left;   bladder tack     BREAST REDUCTION SURGERY Bilateral 10/10/2015   Procedure: BILATERAL MAMMARY REDUCTION  (BREAST) WITH FREE NIPPLE GRAFT;  Surgeon: Irene Limbo, MD;  Location: [place];  Service: Plastics;  Laterality: Bilateral;   CARDIAC CATHETERIZATION     CATARACT EXTRACTION Right    COLONOSCOPY  2009   COLONOSCOPY W/ BIOPSIES AND POLYPECTOMY     CORONARY ARTERY BYPASS GRAFT N/A 01/09/2014   Procedure: CORONARY ARTERY BYPASS GRAFTING (CABG) x 5 using left internal mammary artery and right leg greater saphenous vein harvested endoscopically;  Surgeon: Ivin Poot, MD;  Location: [place];  Service: Open Heart Surgery;  Laterality: N/A;  please use bed extenders and breast binder   INTRAOPERATIVE TRANSESOPHAGEAL ECHOCARDIOGRAM N/A 01/09/2014   Procedure:  INTRAOPERATIVE TRANSESOPHAGEAL ECHOCARDIOGRAM;  Surgeon: Ivin Poot, MD;  Location: [place];  Service: Open Heart Surgery;  Laterality: N/A;   KNEE ARTHROSCOPY  1991   KNEE SURGERY Bilateral 1999   LEFT HEART CATHETERIZATION WITH CORONARY ANGIOGRAM N/A 01/04/2014   Procedure: LEFT HEART CATHETERIZATION WITH CORONARY ANGIOGRAM;  Surgeon: Mallie Mussel  Carlye Grippe, MD;  Location: [facility] C[facility] LAB;  Service: Cardiovascular;  Laterality: N/A;   LESION EXCISION WITH COMPLEX REPAIR N/A 05/07/2016   Procedure: COMPLEX REPAIR OF CHEST 16 CM;  Surgeon: Irene Limbo, MD;  Location: [place];  Service: Plastics;  Laterality: N/A;   MULTIPLE EXTRACTIONS WITH ALVEOLOPLASTY N/A 04/11/2014   Procedure: Extraction of tooth #'s 1,2,8,16 with alveoloplasty, maxillary tuberosity reductions, and gross debridement of remaining teeth.;  Surgeon: Lenn Cal, DDS;  Location: [place];  Service: Oral Surgery;  Laterality: N/A;   NM MYOCAR PERF WALL MOTION  01/2010   dipyridamole myoview - moderate perfusion defect in basal inferoseptal, basal inferior, mid inferoseptal, mid inferior, apical inferior region; EF 56%   TEE WITHOUT CARDIOVERSION  01/2010   EF 60-65%, small, flat, non-infiltrating, calcified, fixed apical/septal mass   TOTAL KNEE ARTHROPLASTY Left 04/29/2011   transanal hemorrhoidal dearterliaization  01/06/2012   with external hemorrhoid removal   UNILATERAL SALPINGECTOMY Right 06/03/2014   Procedure: UNILATERAL SALPINGECTOMY;  Surgeon: Lavonia Drafts, MD;  Location: [facility];  Service: Gynecology;  Laterality: Right;   VAGINAL HYSTERECTOMY N/A 06/03/2014   Procedure: HYSTERECTOMY VAGINAL;  Surgeon: Lavonia Drafts, MD;  Location: [facility];  Service: Gynecology;  Laterality: N/A;   WISDOM TOOTH EXTRACTION      FAMHx:  Family History  Problem Relation Age of Onset   Heart attack Mother 70   Heart disease Mother    Hypertension Mother    Diabetes Mother    Hypertension Sister    Heart failure  Sister    Heart disease Sister    Diabetes Sister    Kidney disease Sister    Kidney disease Brother    Hypertension Daughter    Bipolar disorder Son    Schizophrenia Son    Anxiety disorder Son    Esophageal cancer Other        nephew   Colon cancer Neg Hx    Pancreatic cancer Neg Hx    Stomach cancer Neg Hx    Liver disease Neg Hx    Rectal cancer Neg Hx     SOCHx:   reports that she has never smoked. She has never used smokeless tobacco. She reports that she does not drink alcohol and does not use drugs.  ALLERGIES:  Allergies  Allergen Reactions   Cephalosporins Anaphylaxis, Swelling and Other (See Comments)    Tongue swelling, gum pain   Eicosapentaenoic Acid (Epa) Anaphylaxis and Shortness Of Breath   Fish-Derived Products Anaphylaxis and Shortness Of Breath   Peanuts [Peanut Oil] Anaphylaxis, Shortness Of Breath and Other (See Comments)    Peanut butter   Shrimp [Shellfish Allergy] Anaphylaxis    Pt states her throat will swell if she eats shrimp.   Latex Itching   Metronidazole Other (See Comments)    Palpitations, mild SOB, metallic taste, dry mouth, high blood pressure   Ciprofloxacin Other (See Comments)    Gaging and achy   Lisinopril Cough        Naproxen Nausea Only and Other (See Comments)    Headache   Other Itching and Other (See Comments)     All pain meds make her itch - has to have something to prevent that in addition to receiving med   Prednisone Other (See Comments)    Heart beating fast Ok to take low dosage      ROS: Pertinent items noted in HPI and remainder of comprehensive ROS otherwise negative.  HOME MEDS: Current Outpatient Medications  Medication Sig Dispense Refill   acetaminophen (TYLENOL) 500 MG tablet Take 1,000 mg by mouth daily as needed for moderate pain or headache.     albuterol (PROVENTIL HFA;VENTOLIN HFA) 108 (90 Base) MCG/ACT inhaler Inhale 2 puffs into the lungs every 6 (six) hours as needed for wheezing or  shortness of breath. 1 Inhaler 0   aspirin EC 81 MG tablet Take 81 mg by mouth daily.     Cholecalciferol (VITAMIN D HIGH POTENCY) 25 MCG (1000 UT) capsule Take 1,000 Units by mouth daily.     diclofenac Sodium (VOLTAREN) 1 % GEL Apply 2 g topically 4 (four) times daily. 694 g 2   folic acid (FOLVITE) 1 MG tablet Take 1 tablet (1 mg total) by mouth daily. 90 tablet 0   losartan (COZAAR) 100 MG tablet TAKE 1 TABLET BY MOUTH EVERY DAY 90 tablet 3   metoprolol tartrate (LOPRESSOR) 25 MG tablet TAKE 1 AND 1/2 TABLETS BY MOUTH TWICE A DAY 270 tablet 0   Multiple Vitamin (MULTIVITAMIN WITH MINERALS) TABS tablet Take 1 tablet by mouth every Monday, Wednesday, and Friday.     nystatin cream (MYCOSTATIN) Apply 1 application  topically 2 (two) times daily as needed (rash). 30 g 1   Polyethyl Glycol-Propyl Glycol (SYSTANE FREE OP) Apply 1 tablet to eye daily as needed (For dry eyes).     rosuvastatin (CRESTOR) 10 MG tablet Take 1 tablet (10 mg total) by mouth daily. 90 tablet 3   No current facility-administered medications for this visit.    LABS/IMAGING: No results found for this or any previous visit (from the past 48 hour(s)). No results found.  VITALS: BP (!) 150/84 (BP Location: Left Arm, Patient Position: Sitting, Cuff Size: Large)   Pulse (!) 53   Ht '5\' 8"'$  (1.727 m)   Wt 271 lb 4.8 oz (123.1 kg)   LMP 01/13/2014 Comment: spotting in Feb and bleeding since April  BMI 41.25 kg/m   EXAM: General appearance: alert and no distress Neck: no carotid bruit and no JVD Lungs: clear to auscultation bilaterally Heart: regular rate and rhythm, S1, S2 normal, no murmur, click, rub or gallop Abdomen: soft, non-tender; bowel sounds normal; no masses,  no organomegaly and morbidly obese Extremities: extremities normal, atraumatic, no cyanosis or edema Pulses: 2+ and symmetric Skin: Keloid scar of the midsternal incision site with a dilated varicose vein at the distal aspect Neurologic: Grossly  normal  EKG: Sinus bradycardia 53, poor R wave progression anteriorly-personally reviewed  ASSESSMENT: Coronary artery disease status post 5 vessel CABG (2015) -low risk Myoview 02/2017 Morbid obesity Hypertension Dyslipidemia OSA, not on cPAP Keloid scar - s/p breast reduction, possible upcoming surgery for scar revision Palpitations Leg edema ?  Prolonged QTC-review suggests this is likely not prolonged due to flattening of the T wave  PLAN: 1.   Ms. Furr seems to be doing well without any new chest pain or worsening shortness of breath.  She has managed to recently lose some weight.  Cholesterol is reasonably well controlled although slightly above target LDL less than 70.  She continues to have some discomfort related to keloid scar.  She denies any recurrent palpitations.  EKG is stable on moderate dose of metoprolol.  She notes her heart rate does increase with exercise.  Blood pressure was slightly elevated today but better controlled at home.  We will continue to monitor that.  No medication changes.  Follow-up with me annually or sooner as necessary.  [name], MD,  FACC, [place] Director of the [facility]  [facility] Diplomate of the American Board of Clinical Lipidology Attending Cardiologist  Direct Dial: [phone]  Fax: [phone]  Website:  [email or web address] 09/23/2022, 1:39 PM

## 2022-09-23 NOTE — Patient Instructions (Signed)
Medication Instructions:  NO CHANGES  *If you need a refill on your cardiac medications before your next appointment, please call your pharmacy*   Follow-Up: At [facility], you and your health needs are our priority.  As part of our continuing mission to provide you with exceptional heart care, we have created designated Provider Care Teams.  These Care Teams include your primary Cardiologist (physician) and Advanced Practice Providers (APPs -  Physician Assistants and Nurse Practitioners) who all work together to provide you with the care you need, when you need it.  We recommend signing up for the patient portal called "MyChart".  Sign up information is provided on this After Visit Summary.  MyChart is used to connect with patients for Virtual Visits (Telemedicine).  Patients are able to view lab/test results, encounter notes, upcoming appointments, etc.  Non-urgent messages can be sent to your provider as well.   To learn more about what you can do with MyChart, go to [email or web address].    Your next appointment:    12 months with Dr. Debara Pickett   Call in July for an [facility] appointment

## 2022-09-24 DIAGNOSIS — H25812 Combined forms of age-related cataract, left eye: Secondary | ICD-10-CM | POA: Diagnosis not present

## 2022-09-24 DIAGNOSIS — H04123 Dry eye syndrome of bilateral lacrimal glands: Secondary | ICD-10-CM | POA: Diagnosis not present

## 2022-09-24 DIAGNOSIS — H15111 Episcleritis periodica fugax, right eye: Secondary | ICD-10-CM | POA: Diagnosis not present

## 2022-09-24 DIAGNOSIS — Z961 Presence of intraocular lens: Secondary | ICD-10-CM | POA: Diagnosis not present

## 2022-10-01 ENCOUNTER — Ambulatory Visit: Payer: Medicare Other | Admitting: Internal Medicine

## 2022-10-01 DIAGNOSIS — H04123 Dry eye syndrome of bilateral lacrimal glands: Secondary | ICD-10-CM | POA: Diagnosis not present

## 2022-10-01 DIAGNOSIS — H25812 Combined forms of age-related cataract, left eye: Secondary | ICD-10-CM | POA: Diagnosis not present

## 2022-10-01 DIAGNOSIS — H15111 Episcleritis periodica fugax, right eye: Secondary | ICD-10-CM | POA: Diagnosis not present

## 2022-10-01 DIAGNOSIS — Z961 Presence of intraocular lens: Secondary | ICD-10-CM | POA: Diagnosis not present

## 2022-10-12 DIAGNOSIS — H15111 Episcleritis periodica fugax, right eye: Secondary | ICD-10-CM | POA: Diagnosis not present

## 2022-10-19 ENCOUNTER — Ambulatory Visit: Payer: Medicare Other | Attending: Internal Medicine | Admitting: Internal Medicine

## 2022-10-19 ENCOUNTER — Encounter: Payer: Self-pay | Admitting: Internal Medicine

## 2022-10-19 VITALS — BP 164/85 | HR 85 | Resp 16 | Ht 68.0 in | Wt 271.0 lb

## 2022-10-19 DIAGNOSIS — M0580 Other rheumatoid arthritis with rheumatoid factor of unspecified site: Secondary | ICD-10-CM

## 2022-10-19 DIAGNOSIS — M19012 Primary osteoarthritis, left shoulder: Secondary | ICD-10-CM

## 2022-10-19 DIAGNOSIS — Z79899 Other long term (current) drug therapy: Secondary | ICD-10-CM | POA: Diagnosis not present

## 2022-10-19 MED ORDER — METHOTREXATE SODIUM CHEMO INJECTION 50 MG/2ML: 10.0000 mg | INTRAMUSCULAR | 1 refills | Status: DC

## 2022-10-19 MED ORDER — "TUBERCULIN SYRINGE 26G X 3/8"" 1 ML MISC": 0 refills | Status: DC

## 2022-10-19 NOTE — Progress Notes (Signed)
Office Visit Note  Patient: [name]             Date of Birth: 05-20-1956           MRN: [id number]             PCP: Axel Filler, MD Referring: Axel Filler,* Visit Date: 10/19/2022   Subjective:  Follow-up (Doing good)   History of Present Illness: [name] is a 66 y.o. female here for follow up for seropositive RA. She was prescribed methotrexate 10 mg PO weekly and took this medicine noticing improvement in joint pain symptoms especially arms and hands. However noticed considerable nausea when taking each dose and discontinued the medication after initial 5 week prescription. Currently right shoulder remains doing very well since her injection earlier in the year. Left shoulder is very limited and also right knee is stiff especially after prolonged sitting but not in any exacerbation.   Previous HPI 07/21/22 [name] is a 66 y.o. female here for follow up for follow up for probable seropositive RA.  After our last visit had been prescribed methotrexate to start as initial low-dose treatment option for RA or even steroid sparing coverage for PMR.  She has been concerned about medication side effects and has taken 0 doses of methotrexate.  However since getting intra-articular steroid injection for the right shoulder is doing dramatically better her left shoulder is actually more painful now at this time.  She also describes some ongoing joint stiffness in her hands but right hand is also more flexible after the shoulder steroid injection.  She sees finger triggering of her second digit most commonly at night or in the early morning.  She is starting to redevelop some right knee effusion after previous aspiration injection a couple months ago.  Previous synovial fluid analyses have been bland with white blood cell count very low or up to low 300s.     Previous HPI 04/21/2022 [name] is a 66 y.o. female here for follow up for chronic joint pains  especially bilateral shoulder pain with significant morning stiffness and previous concern for PMR with ultrasound findings consistent with rotator cuff tendonitis.  Since our visit she continues having joint pains in several areas although these are not severely limiting her activity.  She has seen some swelling coming and going in the second MCP joint of her right hand.  She also keeps some amount of pain and swelling in the right knee consistently.  Lab tests at our initial visit showed normal sedimentation rate negative hepatitis screening and normal LFTs. Xrays demonstrating OA most severe in 1st CMC joints otherwise not so advanced and no erosions.   Previous HPI 03/31/2022 [name] is a 66 y.o. female here for evaluation of chronic joint pains especially bilateral shoulder pain with significant morning stiffness and previous concern for PMR with ultrasound findings consistent with rotator cuff tendonitis. She has longstanding knee and lumbar spine osteoarthritis which limits mobility. Previous knee aspiration with non inflammatory synovial fluid analysis 2 and 3 years ago. She had concern for GCA due to onset of headaches and highly elevated sedimentation rate test last year, but negative TA biopsy was obtained with Dr. Katy Fitch. She did not take recommended prednisone course due to side effects. Shoulder pain and also decreased hip flexion mobility ongoing symptoms despite resolution of headache complaints from last year.  More recent inflammatory markers in February or decreased to normal but had low positive  rheumatoid factor.   Labs reviewed 12/2021 RF 24.3 CCP neg ESR 11 CRP 6   06/2021 ESR 100   08/2017 HCV neg   Review of Systems  Constitutional:  Negative for fatigue.  HENT:  Positive for mouth dryness. Negative for mouth sores.   Eyes:  Negative for dryness.  Respiratory:  Negative for shortness of breath.   Cardiovascular:  Positive for chest pain. Negative for palpitations.   Gastrointestinal:  Negative for blood in stool, constipation and diarrhea.  Endocrine: Negative for increased urination.  Genitourinary:  Negative for involuntary urination.  Musculoskeletal:  Positive for joint pain, joint pain, myalgias, muscle weakness, morning stiffness, muscle tenderness and myalgias. Negative for gait problem and joint swelling.  Skin:  Positive for rash. Negative for color change, hair loss and sensitivity to sunlight.  Allergic/Immunologic: Negative for susceptible to infections.  Neurological:  Positive for headaches. Negative for dizziness.  Hematological:  Negative for swollen glands.  Psychiatric/Behavioral:  Negative for depressed mood and sleep disturbance. The patient is not nervous/anxious.     PMFS History:  Patient Active Problem List   Diagnosis Date Noted   Laceration of finger of left hand without damage to nail, subsequent encounter 08/13/2022   Polyarthritis with positive rheumatoid factor ([place]) 03/31/2022   High risk medication use 03/31/2022   Tendinitis of both rotator cuffs 09/07/2021   Keloid scar of skin 09/07/2021   Pes planus of left foot 09/08/2020   Hallux valgus 09/08/2020   Glucose intolerance (impaired glucose tolerance) 07/14/2020   Vitamin D deficiency 07/14/2020   Depression 10/16/2018   Osteoarthritis of left shoulder 10/24/2017   Peripheral neuropathy 08/19/2017   Atherosclerosis of aorta ([place]) 06/20/2015   Osteoarthritis of right knee 11/07/2014   Health care maintenance 10/09/2014   Coronary atherosclerosis of native coronary artery 01/05/2014   Morbid obesity due to excess calories ([place]) 05/10/2012   Obstructive sleep apnea 03/27/2010   GERD 08/06/2008   Hyperlipidemia 10/31/2007   Essential hypertension 06/30/2007    Past Medical History:  Diagnosis Date   Acute urinary retention s/p Foley 01/07/2012   Allergy    Anemia    Asthma    CAD (coronary artery disease)    a. 12/2013 s/p CABG x 5 Dr. Darcey Nora (LIMA to  LAD, SVG to diagonal, SVG to OM1, SVG to OM2, SVG to PDA)   Carotid arterial disease ([place])    a. 12/2013 Carotid U/S: 1-39% bilat ICA stenosis.   Carpal tunnel syndrome, bilateral    GERD (gastroesophageal reflux disease)    Hemorrhoids, internal, with bleeding & prolapse 12/13/2011   Hernia, abdominal    History of blood transfusion    CABG and hysterectomy   History of kidney stones    Hyperlipidemia    Hypertension    Iron deficiency 12/07/2011   Keloid scar    a. Sternal Keloid s/p CABG with ongoing pain.   Morbid obesity ([place])    Myocardial infarct ([place])    2015   Neuropathy    Osteoarthritis    Osteoporosis    PONV (postoperative nausea and vomiting)    Seasonal allergies    Sleep apnea    no CPAP machine; sleep study 02/2010 REM AHI 61.7/hr, total sleep REM 14.8/hr   Tooth abscess    Uterine cancer ([place]) 05/2014   clinical stage IA grade 1 endometrioid endometrial cancer    Family History  Problem Relation Age of Onset   Heart attack Mother 32   Heart disease Mother  Hypertension Mother    Diabetes Mother    Hypertension Sister    Heart failure Sister    Heart disease Sister    Diabetes Sister    Kidney disease Sister    Kidney disease Brother    Hypertension Daughter    Bipolar disorder Son    Schizophrenia Son    Anxiety disorder Son    Esophageal cancer Other        nephew   Colon cancer Neg Hx    Pancreatic cancer Neg Hx    Stomach cancer Neg Hx    Liver disease Neg Hx    Rectal cancer Neg Hx    Past Surgical History:  Procedure Laterality Date   ARTERY BIOPSY Left 07/10/2021   Procedure: LEFT TEMPORAL ARTERY BIOPSY;  Surgeon: Serafina Mitchell, MD;  Location: [facility];  Service: Vascular;  Laterality: Left;   bladder tack     BREAST REDUCTION SURGERY Bilateral 10/10/2015   Procedure: BILATERAL MAMMARY REDUCTION  (BREAST) WITH FREE NIPPLE GRAFT;  Surgeon: Irene Limbo, MD;  Location: [facility];  Service: Plastics;  Laterality: Bilateral;   CARDIAC  CATHETERIZATION     CATARACT EXTRACTION Right    COLONOSCOPY  2009   COLONOSCOPY W/ BIOPSIES AND POLYPECTOMY     CORONARY ARTERY BYPASS GRAFT N/A 01/09/2014   Procedure: CORONARY ARTERY BYPASS GRAFTING (CABG) x 5 using left internal mammary artery and right leg greater saphenous vein harvested endoscopically;  Surgeon: Ivin Poot, MD;  Location: [facility];  Service: Open Heart Surgery;  Laterality: N/A;  please use bed extenders and breast binder   INTRAOPERATIVE TRANSESOPHAGEAL ECHOCARDIOGRAM N/A 01/09/2014   Procedure: INTRAOPERATIVE TRANSESOPHAGEAL ECHOCARDIOGRAM;  Surgeon: Ivin Poot, MD;  Location: [facility];  Service: Open Heart Surgery;  Laterality: N/A;   KNEE ARTHROSCOPY  1991   KNEE SURGERY Bilateral 1999   LEFT HEART CATHETERIZATION WITH CORONARY ANGIOGRAM N/A 01/04/2014   Procedure: LEFT HEART CATHETERIZATION WITH CORONARY ANGIOGRAM;  Surgeon: Sinclair Grooms, MD;  Location: [facility];  Service: Cardiovascular;  Laterality: N/A;   LESION EXCISION WITH COMPLEX REPAIR N/A 05/07/2016   Procedure: COMPLEX REPAIR OF CHEST 16 CM;  Surgeon: Irene Limbo, MD;  Location: [facility];  Service: Plastics;  Laterality: N/A;   MULTIPLE EXTRACTIONS WITH ALVEOLOPLASTY N/A 04/11/2014   Procedure: Extraction of tooth #'s 1,2,8,16 with alveoloplasty, maxillary tuberosity reductions, and gross debridement of remaining teeth.;  Surgeon: Lenn Cal, DDS;  Location: [facility];  Service: Oral Surgery;  Laterality: N/A;   NM MYOCAR PERF WALL MOTION  01/2010   dipyridamole myoview - moderate perfusion defect in basal inferoseptal, basal inferior, mid inferoseptal, mid inferior, apical inferior region; EF 56%   TEE WITHOUT CARDIOVERSION  01/2010   EF 60-65%, small, flat, non-infiltrating, calcified, fixed apical/septal mass   TOTAL KNEE ARTHROPLASTY Left 04/29/2011   transanal hemorrhoidal dearterliaization  01/06/2012   with external hemorrhoid removal   UNILATERAL SALPINGECTOMY Right 06/03/2014    Procedure: UNILATERAL SALPINGECTOMY;  Surgeon: Lavonia Drafts, MD;  Location: [facility];  Service: Gynecology;  Laterality: Right;   VAGINAL HYSTERECTOMY N/A 06/03/2014   Procedure: HYSTERECTOMY VAGINAL;  Surgeon: Lavonia Drafts, MD;  Location: [facility];  Service: Gynecology;  Laterality: N/A;   WISDOM TOOTH EXTRACTION     Social History   Social History Narrative   Current Social History 08/27/2020        Patient lives alone in a handicap-accessible ground floor apartment which is 1 story. There are not steps up  to the entrance the patient uses.       Patient's method of transportation is personal car or family member.      The highest level of education was 2 years of college.      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 [employer]      Right Handed      Identified important Relationships are My grandchildren, daughter, son       Pets : None       Interests / Fun: Plants       Current Stressors: Son with special needs (Bi-polar/schizophrenia) , he is not living with her        Religious / Personal Beliefs: You [employer] mentors (Vegan videos)      [place], RN,BSN          Immunization History  Administered Date(s) Administered   Influenza Split 01/07/2012   Influenza Whole 10/31/2007, 09/18/2009, 09/25/2010   Influenza, Seasonal, Injecte, Preservative Fre 11/28/2012   Influenza,inj,Quad PF,6+ Mos 08/07/2013, 01/02/2014, 08/01/2015, 09/20/2016, 09/05/2017, 10/16/2018   Influenza-Unspecified 09/07/2021   PFIZER(Purple Top)SARS-COV-2 Vaccination 04/10/2020, 05/05/2020   Pneumococcal Conjugate-13 07/14/2020   Pneumococcal Polysaccharide-23 01/07/2012, 10/24/2017   Td 10/15/2009   Tdap 02/23/2021     Objective: Vital Signs: BP (!) 164/85 (BP Location: Left Arm, Patient Position: Sitting, Cuff Size: Large)   Pulse 85   Resp 16   Ht _0  (1.727 m)   Wt 271 lb (122.9 kg)   LMP 01/13/2014 Comment: spotting in Feb and bleeding  since April  BMI 41.21 kg/m    Physical Exam Constitutional:      Appearance: She is obese.  Cardiovascular:     Rate and Rhythm: Normal rate and regular rhythm.  Pulmonary:     Effort: Pulmonary effort is normal.     Breath sounds: Normal breath sounds.  Skin:    General: Skin is warm and dry.     Findings: No rash.    Musculoskeletal Exam:  Left shoulder range of motion is mildly restricted in overhead abduction with pain on passive and active ROM, no palpable effusion Elbows full ROM no tenderness or swelling Wrists full ROM no tenderness or swelling Chronic appearing joint widening or soft tissue thickening but full ROM and no palpable synovitis Knees full ROM, small right knee effusion present  CDAI Exam: CDAI Score: 7  Patient Global: 20 mm; Provider Global: 20 mm Swollen: 1 ; Tender: 2  Joint Exam 10/19/2022      Right  Left  Glenohumeral      Tender  Knee   Tender  Swollen      Investigation: No additional findings.  Imaging: No results found.  Recent Labs: Lab Results  Component Value Date   WBC 5.3 10/19/2022   HGB 14.2 10/19/2022   PLT 220 10/19/2022   NA 141 10/19/2022   K 3.7 10/19/2022   CL 104 10/19/2022   CO2 28 10/19/2022   GLUCOSE 130 10/19/2022   BUN 11 10/19/2022   CREATININE 0.72 10/19/2022   BILITOT 0.4 10/19/2022   ALKPHOS 46 10/16/2018   AST 12 10/19/2022   ALT 6 10/19/2022   PROT 7.8 10/19/2022   ALBUMIN 4.1 10/16/2018   CALCIUM 9.5 10/19/2022   GFRAA 99 07/14/2020    Speciality Comments: No specialty comments available.  Procedures:  Large Joint Inj: L glenohumeral on 10/19/2022 3:30 PM Indications: pain Details: 25 G 1.5 in needle, posterior approach Medications: 3 mL lidocaine 1 %; 40 mg  triamcinolone acetonide 40 MG/ML Procedure, treatment alternatives, risks and benefits explained, specific risks discussed. Consent was given by the patient. Immediately prior to procedure a time out was called to verify the correct  patient, procedure, equipment, support staff and site/side marked as required. Patient was prepped and draped in the usual sterile fashion.     Allergies: Cephalosporins, Eicosapentaenoic acid (epa), Fish-derived products, Peanuts [peanut oil], Shrimp [shellfish allergy], Latex, Metronidazole, Methotrexate derivatives, Ciprofloxacin, Lisinopril, Naproxen, Other, and Prednisone   Assessment / Plan:     Visit Diagnoses: Polyarthritis with positive rheumatoid factor ([place]) - Plan: methotrexate 50 MG/2ML injection, TUBERCULIN SYR 1CC/26GX3/8" 26G X 3/8" 1 ML MISC, Sedimentation rate  Overall joint inflammation does not appear terrible though she did not stay on the methotrexate with GI intolerance of the oral medication.  Current problems primarily in the shoulders and knees may also be more coming from underlying osteoarthritis.  Checking sedimentation rate for disease activity monitoring.  Will recommend switch to subcutaneous methotrexate injection 10 mg subcu once weekly and continue folic acid 1 mg daily.  High risk medication use - Plan: CBC with Differential/Platelet, COMPLETE METABOLIC PANEL WITH GFR  Will recheck CBC and CMP for monitoring with methotrexate though she did stop the oral medication so cannot say whether there was any transient effect.  Had GI intolerance hopefully the subcutaneous administration route will do better for her.  Primary osteoarthritis of left shoulder - Plan: Large Joint Inj: L glenohumeral  Glenohumeral joint steroid injection today for ongoing left shoulder pain.  Orders: Orders Placed This Encounter  Procedures   Large Joint Inj: L glenohumeral   Sedimentation rate   CBC with Differential/Platelet   COMPLETE METABOLIC PANEL WITH GFR   Meds ordered this encounter  Medications   methotrexate 50 MG/2ML injection    Sig: Inject 0.4 mLs (10 mg total) into the skin once a week.    Dispense:  4 mL    Refill:  1   TUBERCULIN SYR 1CC/26GX3/8" 26G X 3/8" 1  ML MISC    Sig: For injection of subcutaneous methotrexate once weekly    Dispense:  25 each    Refill:  0    Pharmacy may substitute for equivalent volume syringes and equipment eg needles as needed     Follow-Up Instructions: Return in about 3 months (around 01/19/2023) for RA on MTX/inj f/u 44mo.   CCollier Salina MD  Note - This record has been created using [employer]  Chart creation errors have been sought, but may not always  have been located. Such creation errors do not reflect on  the standard of medical care.

## 2022-10-20 LAB — COMPLETE METABOLIC PANEL WITH GFR
AG Ratio: 1.1 (calc) (ref 1.0–2.5)
ALT: 6 U/L (ref 6–29)
AST: 12 U/L (ref 10–35)
Albumin: 4.1 g/dL (ref 3.6–5.1)
Alkaline phosphatase (APISO): 47 U/L (ref 37–153)
BUN: 11 mg/dL (ref 7–25)
CO2: 28 mmol/L (ref 20–32)
Calcium: 9.5 mg/dL (ref 8.6–10.4)
Chloride: 104 mmol/L (ref 98–110)
Creat: 0.72 mg/dL (ref 0.50–1.05)
Globulin: 3.7 g/dL (calc) (ref 1.9–3.7)
Glucose, Bld: 130 mg/dL (ref 65–139)
Potassium: 3.7 mmol/L (ref 3.5–5.3)
Sodium: 141 mmol/L (ref 135–146)
Total Bilirubin: 0.4 mg/dL (ref 0.2–1.2)
Total Protein: 7.8 g/dL (ref 6.1–8.1)
eGFR: 91 mL/min/{1.73_m2} (ref >=60)

## 2022-10-20 LAB — CBC WITH DIFFERENTIAL/PLATELET
Absolute Monocytes: 403 cells/uL (ref 200–950)
Basophils Absolute: 32 cells/uL (ref 0–200)
Basophils Relative: 0.6 %
Eosinophils Absolute: 122 cells/uL (ref 15–500)
Eosinophils Relative: 2.3 %
HCT: 42.5 % (ref 35.0–45.0)
Hemoglobin: 14.2 g/dL (ref 11.7–15.5)
Lymphs Abs: 2290 cells/uL (ref 850–3900)
MCH: 28.6 pg (ref 27.0–33.0)
MCHC: 33.4 g/dL (ref 32.0–36.0)
MCV: 85.7 fL (ref 80.0–100.0)
MPV: 10.7 fL (ref 7.5–12.5)
Monocytes Relative: 7.6 %
Neutro Abs: 2454 cells/uL (ref 1500–7800)
Neutrophils Relative %: 46.3 %
Platelets: 220 10*3/uL (ref 140–400)
RBC: 4.96 10*6/uL (ref 3.80–5.10)
RDW: 14.2 % (ref 11.0–15.0)
Total Lymphocyte: 43.2 %
WBC: 5.3 10*3/uL (ref 3.8–10.8)

## 2022-10-20 LAB — SEDIMENTATION RATE: Sed Rate: 43 mm/h — ABNORMAL HIGH (ref 0–30)

## 2022-10-31 MED ORDER — TRIAMCINOLONE ACETONIDE 40 MG/ML IJ SUSP
40.0000 mg | INTRAMUSCULAR | Status: AC | PRN
  Administered 2022-10-19: 40 mg via INTRA_ARTICULAR

## 2022-10-31 MED ORDER — LIDOCAINE HCL 1 % IJ SOLN
3.0000 mL | INTRAMUSCULAR | Status: AC | PRN
  Administered 2022-10-19: 3 mL

## 2022-11-02 ENCOUNTER — Emergency Department (HOSPITAL_COMMUNITY): Payer: Medicare Other

## 2022-11-02 ENCOUNTER — Encounter (HOSPITAL_COMMUNITY): Payer: Self-pay

## 2022-11-02 ENCOUNTER — Emergency Department (HOSPITAL_COMMUNITY)
Admission: EM | Admit: 2022-11-02 | Discharge: 2022-11-03 | Disposition: A | Discharge status: Home / Self Care | Payer: Medicare Other | Attending: Emergency Medicine | Admitting: Emergency Medicine

## 2022-11-02 ENCOUNTER — Other Ambulatory Visit: Payer: Self-pay

## 2022-11-02 DIAGNOSIS — I2510 Atherosclerotic heart disease of native coronary artery without angina pectoris: Secondary | ICD-10-CM | POA: Insufficient documentation

## 2022-11-02 DIAGNOSIS — I10 Essential (primary) hypertension: Secondary | ICD-10-CM | POA: Insufficient documentation

## 2022-11-02 DIAGNOSIS — R0789 Other chest pain: Secondary | ICD-10-CM | POA: Diagnosis not present

## 2022-11-02 DIAGNOSIS — Z79899 Other long term (current) drug therapy: Secondary | ICD-10-CM | POA: Diagnosis not present

## 2022-11-02 DIAGNOSIS — J45909 Unspecified asthma, uncomplicated: Secondary | ICD-10-CM | POA: Diagnosis not present

## 2022-11-02 DIAGNOSIS — Z743 Need for continuous supervision: Secondary | ICD-10-CM | POA: Diagnosis not present

## 2022-11-02 DIAGNOSIS — E669 Obesity, unspecified: Secondary | ICD-10-CM | POA: Diagnosis not present

## 2022-11-02 DIAGNOSIS — Z7982 Long term (current) use of aspirin: Secondary | ICD-10-CM | POA: Insufficient documentation

## 2022-11-02 DIAGNOSIS — G4733 Obstructive sleep apnea (adult) (pediatric): Secondary | ICD-10-CM | POA: Insufficient documentation

## 2022-11-02 DIAGNOSIS — R6889 Other general symptoms and signs: Secondary | ICD-10-CM | POA: Diagnosis not present

## 2022-11-02 DIAGNOSIS — R002 Palpitations: Secondary | ICD-10-CM | POA: Diagnosis not present

## 2022-11-02 LAB — CBC WITH DIFFERENTIAL/PLATELET
Abs Immature Granulocytes: 0.02 10*3/uL (ref 0.00–0.07)
Basophils Absolute: 0 10*3/uL (ref 0.0–0.1)
Basophils Relative: 0 %
Eosinophils Absolute: 0.1 10*3/uL (ref 0.0–0.5)
Eosinophils Relative: 1 %
HCT: 37.7 % (ref 36.0–46.0)
Hemoglobin: 13.2 g/dL (ref 12.0–15.0)
Immature Granulocytes: 0 %
Lymphocytes Relative: 31 %
Lymphs Abs: 2.1 10*3/uL (ref 0.7–4.0)
MCH: 29.1 pg (ref 26.0–34.0)
MCHC: 35 g/dL (ref 30.0–36.0)
MCV: 83 fL (ref 80.0–100.0)
Monocytes Absolute: 0.5 10*3/uL (ref 0.1–1.0)
Monocytes Relative: 7 %
Neutro Abs: 4.3 10*3/uL (ref 1.7–7.7)
Neutrophils Relative %: 61 %
Platelets: 188 10*3/uL (ref 150–400)
RBC: 4.54 MIL/uL (ref 3.87–5.11)
RDW: 15.1 % (ref 11.5–15.5)
WBC: 7 10*3/uL (ref 4.0–10.5)
nRBC: 0 % (ref 0.0–0.2)

## 2022-11-02 LAB — CBG MONITORING, ED: Glucose-Capillary: 114 mg/dL — ABNORMAL HIGH (ref 70–99)

## 2022-11-02 LAB — PROTIME-INR
INR: 1 (ref 0.8–1.2)
Prothrombin Time: 12.9 seconds (ref 11.4–15.2)

## 2022-11-02 MED ORDER — NITROGLYCERIN 2 % TD OINT
1.0000 [in_us] | TOPICAL_OINTMENT | Freq: Once | TRANSDERMAL | Status: AC
  Administered 2022-11-02: 1 [in_us] via TOPICAL
  Filled 2022-11-02: qty 1

## 2022-11-02 NOTE — ED Provider Notes (Signed)
11:48 PM Assumed care from Dr. Doren Custard, please see their note for full history, physical and decision making until this point. In brief this is a 66 y.o. year old female who presented to the ED tonight with Palpitations     Chest tightness and palpitations. Pending chest pain rule out. If normal, suspect GI cause but has CAD history.  GI cause most likely. Troponin negative. Improved with meds here. Will fu w/ cardiology.   Discharge instructions, including strict return precautions for new or worsening symptoms, given. Patient and/or family verbalized understanding and agreement with the plan as described.   Labs, studies and imaging reviewed by myself and considered in medical decision making if ordered. Imaging interpreted by radiology.  Labs Reviewed  CBC WITH DIFFERENTIAL/PLATELET  BASIC METABOLIC PANEL  MAGNESIUM  LIPASE, BLOOD  HEPATIC FUNCTION PANEL  PROTIME-INR  CBG MONITORING, ED  TROPONIN I (HIGH SENSITIVITY)    DG Chest Portable 1 View  Final Result      No follow-ups on file.    [name], Corene Cornea, MD 11/04/22 [phone]

## 2022-11-02 NOTE — ED Provider Notes (Signed)
[facility]ENC[facility] DEPARTMENT Provider Note   CSN: [id number] Arrival date & time: 11/02/22  2135     History {Add pertinent medical, surgical, social history, OB history to HPI:1} Chief Complaint  Patient presents with   Palpitations    [name] is a 66 y.o. female.   Palpitations Patient presents for chest tightness and palpitations.  Medical history includes HLD, OSA, HTN, GERD, obesity, arthritis, CAD, anemia, asthma.  She reports that the symptoms have been intermittent over the past 1.5 weeks.  They seem to worsen postprandially.  Typically, they resolve after 1 or 2 hours.  Today she had onset of symptoms at 10 AM.  Symptoms persisted throughout the day.  This did prompt her to call EMS.  She received 324 ASA on scene.  Vital signs with EMS are notable for hypertension.  Patient does take blood pressure medications and states that she did take her morning doses.  Currently, she endorses some continued chest tightness.  Her palpitations have resolved.     Home Medications Prior to Admission medications   Medication Sig Start Date End Date Taking? Authorizing Provider  albuterol (PROVENTIL HFA;VENTOLIN HFA) 108 (90 Base) MCG/ACT inhaler Inhale 2 puffs into the lungs every 6 (six) hours as needed for wheezing or shortness of breath. 12/22/18   Kathi Ludwig, MD  aspirin EC 81 MG tablet Take 81 mg by mouth daily. Patient not taking: Reported on 10/19/2022    [provider]  Cholecalciferol (VITAMIN D HIGH POTENCY) 25 MCG (1000 UT) capsule Take 1,000 Units by mouth daily.    [provider]  folic acid (FOLVITE) 1 MG tablet Take 1 tablet (1 mg total) by mouth daily. 04/21/22   Collier Salina, MD  losartan (COZAAR) 100 MG tablet TAKE 1 TABLET BY MOUTH EVERY DAY 02/16/22   Axel Filler, MD  methotrexate 50 MG/2ML injection Inject 0.4 mLs (10 mg total) into the skin once a week. 10/19/22   Rice, Resa Miner, MD   metoprolol tartrate (LOPRESSOR) 25 MG tablet TAKE 1 AND 1/2 TABLETS BY MOUTH TWICE A DAY 09/06/22   Hilty, Nadean Corwin, MD  Multiple Vitamin (MULTIVITAMIN WITH MINERALS) TABS tablet Take 1 tablet by mouth every Monday, Wednesday, and Friday.    [provider]  nystatin cream (MYCOSTATIN) Apply 1 application  topically 2 (two) times daily as needed (rash). 05/03/22   Axel Filler, MD  Polyethyl Glycol-Propyl Glycol (SYSTANE FREE OP) Apply 1 tablet to eye daily as needed (For dry eyes).    [provider]  rosuvastatin (CRESTOR) 10 MG tablet Take 1 tablet (10 mg total) by mouth daily. Patient not taking: Reported on 10/19/2022 01/12/22   Axel Filler, MD  TUBERCULIN SYR 1CC/26GX3/8" 26G X 3/8" 1 ML MISC For injection of subcutaneous methotrexate once weekly 10/19/22   Collier Salina, MD      Allergies    Cephalosporins, Eicosapentaenoic acid (epa), Fish-derived products, Peanuts [peanut oil], Shrimp [shellfish allergy], Latex, Metronidazole, Methotrexate derivatives, Ciprofloxacin, Lisinopril, Naproxen, Other, and Prednisone    Review of Systems   Review of Systems  Respiratory:  Positive for chest tightness.   Cardiovascular:  Positive for palpitations.  All other systems reviewed and are negative.   Physical Exam Updated Vital Signs LMP 01/13/2014 Comment: spotting in Feb and bleeding since April Physical Exam Vitals and nursing note reviewed.  Constitutional:      General: She is not in acute distress.    Appearance: Normal appearance.  She is well-developed. She is not ill-appearing, toxic-appearing or diaphoretic.  HENT:     Head: Normocephalic and atraumatic.     Right Ear: External ear normal.     Left Ear: External ear normal.     Nose: Nose normal.     Mouth/Throat:     Mouth: Mucous membranes are moist.  Eyes:     Extraocular Movements: Extraocular movements intact.     Conjunctiva/sclera: Conjunctivae normal.  Cardiovascular:      Rate and Rhythm: Normal rate and regular rhythm.     Heart sounds: No murmur heard. Pulmonary:     Effort: Pulmonary effort is normal. No respiratory distress.     Breath sounds: Normal breath sounds. No wheezing or rales.  Chest:     Chest wall: No tenderness.  Abdominal:     General: There is no distension.     Palpations: Abdomen is soft.     Tenderness: There is no abdominal tenderness.  Musculoskeletal:        General: No swelling. Normal range of motion.     Cervical back: Normal range of motion and neck supple.  Skin:    General: Skin is warm and dry.     Capillary Refill: Capillary refill takes less than 2 seconds.     Coloration: Skin is not jaundiced or pale.  Neurological:     General: No focal deficit present.     Mental Status: She is alert and oriented to person, place, and time.     Cranial Nerves: No cranial nerve deficit.     Sensory: No sensory deficit.     Motor: No weakness.     Coordination: Coordination normal.  Psychiatric:        Mood and Affect: Mood normal.        Behavior: Behavior normal.        Thought Content: Thought content normal.        Judgment: Judgment normal.     ED Results / Procedures / Treatments   Labs (all labs ordered are listed, but only abnormal results are displayed) Labs Reviewed - No data to display  EKG None  Radiology No results found.  Procedures Procedures  {Document cardiac monitor, telemetry assessment procedure when appropriate:1}  Medications Ordered in ED Medications - No data to display  ED Course/ Medical Decision Making/ A&P                           Medical Decision Making  This patient presents to the ED for concern of ***, this involves an extensive number of treatment options, and is a complaint that carries with it a high risk of complications and morbidity.  The differential diagnosis includes ***   Co morbidities that complicate the patient evaluation  ***   Additional history  obtained:  Additional history obtained from *** External records from outside source obtained and reviewed including ***   Lab Tests:  I Ordered, and personally interpreted labs.  The pertinent results include:  ***   Imaging Studies ordered:  I ordered imaging studies including ***  I independently visualized and interpreted imaging which showed *** I agree with the radiologist interpretation   Cardiac Monitoring: / EKG:  The patient was maintained on a cardiac monitor.  I personally viewed and interpreted the cardiac monitored which showed an underlying rhythm of: ***   Consultations Obtained:  I requested consultation with the ***,  and discussed lab and imaging  findings as well as pertinent plan - they recommend: ***   Problem List / ED Course / Critical interventions / Medication management  Patient with history of CAD presenting for chest tightness and palpitations.  Palpitations resolved prior to EMS arrival on scene.  She did receive 324 ASA prior to arrival.  No other interventions were given.  Last doses of blood pressure medications were this morning.  On arrival in the ED, she does endorse some ongoing mild chest tightness.  EKG shows***.  Patient's blood pressure***.  NTG was ordered.  Diagnostic workup was initiated.***. I ordered medication including ***  for ***  Reevaluation of the patient after these medicines showed that the patient {resolved/improved/worsened:23923::"improved"} I have reviewed the patients home medicines and have made adjustments as needed   Social Determinants of Health:  ***   Test / Admission - Considered:  ***   {Document critical care time when appropriate:1} {Document review of labs and clinical decision tools ie heart score, Chads2Vasc2 etc:1}  {Document your independent review of radiology images, and any outside records:1} {Document your discussion with family members, caretakers, and with consultants:1} {Document social  determinants of health affecting pt's care:1} {Document your decision making why or why not admission, treatments were needed:1} Final Clinical Impression(s) / ED Diagnoses Final diagnoses:  None    Rx / DC Orders ED Discharge Orders     None

## 2022-11-02 NOTE — ED Triage Notes (Signed)
Pt BIB [facility] from home w c/o chest fluttering. Pt stated she had a MI and a open heart surgery in the past.  EMS VS BP 206/95 P 70 O2 100% RA CBG 119

## 2022-11-03 ENCOUNTER — Telehealth: Payer: Self-pay

## 2022-11-03 DIAGNOSIS — R0789 Other chest pain: Secondary | ICD-10-CM | POA: Diagnosis not present

## 2022-11-03 LAB — BASIC METABOLIC PANEL
Anion gap: 9 (ref 5–15)
BUN: 16 mg/dL (ref 8–23)
CO2: 23 mmol/L (ref 22–32)
Calcium: 8.9 mg/dL (ref 8.9–10.3)
Chloride: 105 mmol/L (ref 98–111)
Creatinine, Ser: 0.77 mg/dL (ref 0.44–1.00)
GFR, Estimated: >60 mL/min (ref >60)
Glucose, Bld: 110 mg/dL — ABNORMAL HIGH (ref 70–99)
Potassium: 3.8 mmol/L (ref 3.5–5.1)
Sodium: 137 mmol/L (ref 135–145)

## 2022-11-03 LAB — HEPATIC FUNCTION PANEL
ALT: 10 U/L (ref 0–44)
AST: 15 U/L (ref 15–41)
Albumin: 3.4 g/dL — ABNORMAL LOW (ref 3.5–5.0)
Alkaline Phosphatase: 43 U/L (ref 38–126)
Bilirubin, Direct: 0.1 mg/dL (ref 0.0–0.2)
Indirect Bilirubin: 0.1 mg/dL — ABNORMAL LOW (ref 0.3–0.9)
Total Bilirubin: 0.2 mg/dL — ABNORMAL LOW (ref 0.3–1.2)
Total Protein: 6.9 g/dL (ref 6.5–8.1)

## 2022-11-03 LAB — MAGNESIUM: Magnesium: 2.1 mg/dL (ref 1.7–2.4)

## 2022-11-03 LAB — LIPASE, BLOOD: Lipase: 29 U/L (ref 11–51)

## 2022-11-03 LAB — TROPONIN I (HIGH SENSITIVITY): Troponin I (High Sensitivity): 5 ng/L (ref <18)

## 2022-11-03 MED ORDER — ALUM & MAG HYDROXIDE-SIMETH 400-400-40 MG/5ML PO SUSP: 15.0000 mL | Freq: Four times a day (QID) | ORAL | 0 refills | Status: DC | PRN

## 2022-11-03 MED ORDER — FAMOTIDINE 20 MG PO TABS: 20.0000 mg | ORAL_TABLET | Freq: Two times a day (BID) | ORAL | 0 refills | Status: DC

## 2022-11-03 MED ORDER — LIDOCAINE VISCOUS HCL 2 % MT SOLN
15.0000 mL | Freq: Once | OROMUCOSAL | Status: AC
  Administered 2022-11-03: 15 mL via OROMUCOSAL
  Filled 2022-11-03: qty 15

## 2022-11-03 MED ORDER — ALUM & MAG HYDROXIDE-SIMETH 200-200-20 MG/5ML PO SUSP
30.0000 mL | Freq: Once | ORAL | Status: AC
  Administered 2022-11-03: 30 mL via ORAL
  Filled 2022-11-03: qty 30

## 2022-11-03 NOTE — Patient Outreach (Signed)
  Care Coordination TOC Note Transition Care Management Follow-up Telephone Call Date of discharge and from where: [facility] 11/03/22. How have you been since you were released from the hospital? "I feel much better today, I am taking it easy" Any questions or concerns? No  Items Reviewed: Did the pt receive and understand the discharge instructions provided? Yes  Medications obtained and verified? No -picking up Maalox plus this afternoon Other? No  Any new allergies since your discharge? No  Dietary orders reviewed? No Do you have support at home? Yes -children check on her  Home Care and Equipment/Supplies: Were home health services ordered? no If so, what is the name of the agency? N/a  Has the agency set up a time to come to the patient's home? no Were any new equipment or medical supplies ordered?  No What is the name of the medical supply agency? N/a Were you able to get the supplies/equipment? not applicable Do you have any questions related to the use of the equipment or supplies? No  Functional Questionnaire: (I = Independent and D = Dependent) ADLs: I  Bathing/Dressing- I  Meal Prep- I  Eating- I  Maintaining continence- I  Transferring/Ambulation- I  Managing Meds- I  Follow up appointments reviewed:  PCP Hospital f/u appt confirmed? No   Specialist Hospital f/u appt confirmed? No  Are transportation arrangements needed? No  If their condition worsens, is the pt aware to call PCP or go to the Emergency Dept.? Yes Was the patient provided with contact information for the PCP's office or ED? Yes Was to pt encouraged to call back with questions or concerns? Yes  SDOH assessments and interventions completed:   Yes   Care Coordination Interventions:  PCP follow up appointment requested   Encounter Outcome:  Pt. Visit Completed

## 2022-11-03 NOTE — ED Provider Notes (Incomplete)
[facility] EMERGENCY DEPARTMENT Provider Note   CSN: [id number] Arrival date & time: 11/02/22  2135     History {Add pertinent medical, surgical, social history, OB history to HPI:1} Chief Complaint  Patient presents with  . Palpitations    [name] is a 66 y.o. female.   Palpitations Patient presents for chest tightness and palpitations.  Medical history includes HLD, OSA, HTN, GERD, obesity, arthritis, CAD, anemia, asthma.  She reports that the symptoms have been intermittent over the past 1.5 weeks.  They seem to worsen postprandially.  Typically, they resolve after 1 or 2 hours.  Today she had onset of symptoms at 10 AM.  Symptoms persisted throughout the day.  This did prompt her to call EMS.  She received 324 ASA on scene.  Vital signs with EMS are notable for hypertension.  Patient does take blood pressure medications and states that she did take her morning doses.  Currently, she endorses some continued chest tightness.  Her palpitations have resolved.     Home Medications Prior to Admission medications   Medication Sig Start Date End Date Taking? Authorizing Provider  albuterol (PROVENTIL HFA;VENTOLIN HFA) 108 (90 Base) MCG/ACT inhaler Inhale 2 puffs into the lungs every 6 (six) hours as needed for wheezing or shortness of breath. 12/22/18   Kathi Ludwig, MD  aspirin EC 81 MG tablet Take 81 mg by mouth daily. Patient not taking: Reported on 10/19/2022    [provider]  Cholecalciferol (VITAMIN D HIGH POTENCY) 25 MCG (1000 UT) capsule Take 1,000 Units by mouth daily.    [provider]  folic acid (FOLVITE) 1 MG tablet Take 1 tablet (1 mg total) by mouth daily. 04/21/22   Collier Salina, MD  losartan (COZAAR) 100 MG tablet TAKE 1 TABLET BY MOUTH EVERY DAY 02/16/22   Axel Filler, MD  methotrexate 50 MG/2ML injection Inject 0.4 mLs (10 mg total) into the skin once a week. 10/19/22   Rice, Resa Miner, MD   metoprolol tartrate (LOPRESSOR) 25 MG tablet TAKE 1 AND 1/2 TABLETS BY MOUTH TWICE A DAY 09/06/22   Hilty, Nadean Corwin, MD  Multiple Vitamin (MULTIVITAMIN WITH MINERALS) TABS tablet Take 1 tablet by mouth every Monday, Wednesday, and Friday.    [provider]  nystatin cream (MYCOSTATIN) Apply 1 application  topically 2 (two) times daily as needed (rash). 05/03/22   Axel Filler, MD  Polyethyl Glycol-Propyl Glycol (SYSTANE FREE OP) Apply 1 tablet to eye daily as needed (For dry eyes).    [provider]  rosuvastatin (CRESTOR) 10 MG tablet Take 1 tablet (10 mg total) by mouth daily. Patient not taking: Reported on 10/19/2022 01/12/22   Axel Filler, MD  TUBERCULIN SYR 1CC/26GX3/8" 26G X 3/8" 1 ML MISC For injection of subcutaneous methotrexate once weekly 10/19/22   Collier Salina, MD      Allergies    Cephalosporins, Eicosapentaenoic acid (epa), Fish-derived products, Peanuts [peanut oil], Shrimp [shellfish allergy], Latex, Metronidazole, Methotrexate derivatives, Ciprofloxacin, Lisinopril, Naproxen, Other, and Prednisone    Review of Systems   Review of Systems  Respiratory:  Positive for chest tightness.   Cardiovascular:  Positive for palpitations.  All other systems reviewed and are negative.   Physical Exam Updated Vital Signs LMP 01/13/2014 Comment: spotting in Feb and bleeding since April Physical Exam Vitals and nursing note reviewed.  Constitutional:      General: She is not in acute distress.    Appearance: Normal appearance.  She is well-developed. She is not ill-appearing, toxic-appearing or diaphoretic.  HENT:     Head: Normocephalic and atraumatic.     Right Ear: External ear normal.     Left Ear: External ear normal.     Nose: Nose normal.     Mouth/Throat:     Mouth: Mucous membranes are moist.  Eyes:     Extraocular Movements: Extraocular movements intact.     Conjunctiva/sclera: Conjunctivae normal.  Cardiovascular:      Rate and Rhythm: Normal rate and regular rhythm.     Heart sounds: No murmur heard. Pulmonary:     Effort: Pulmonary effort is normal. No respiratory distress.     Breath sounds: Normal breath sounds. No wheezing or rales.  Chest:     Chest wall: No tenderness.  Abdominal:     General: There is no distension.     Palpations: Abdomen is soft.     Tenderness: There is no abdominal tenderness.  Musculoskeletal:        General: No swelling. Normal range of motion.     Cervical back: Normal range of motion and neck supple.  Skin:    General: Skin is warm and dry.     Capillary Refill: Capillary refill takes less than 2 seconds.     Coloration: Skin is not jaundiced or pale.  Neurological:     General: No focal deficit present.     Mental Status: She is alert and oriented to person, place, and time.     Cranial Nerves: No cranial nerve deficit.     Sensory: No sensory deficit.     Motor: No weakness.     Coordination: Coordination normal.  Psychiatric:        Mood and Affect: Mood normal.        Behavior: Behavior normal.        Thought Content: Thought content normal.        Judgment: Judgment normal.     ED Results / Procedures / Treatments   Labs (all labs ordered are listed, but only abnormal results are displayed) Labs Reviewed - No data to display  EKG None  Radiology No results found.  Procedures Procedures  {Document cardiac monitor, telemetry assessment procedure when appropriate:1}  Medications Ordered in ED Medications - No data to display  ED Course/ Medical Decision Making/ A&P                           Medical Decision Making Amount and/or Complexity of Data Reviewed Labs: ordered. Radiology: ordered.  Risk Prescription drug management.   This patient presents to the ED for concern of chest tightness, this involves an extensive number of treatment options, and is a complaint that carries with it a high risk of complications and morbidity.  The  differential diagnosis includes ACS, respiratory disease, pericarditis, GERD   Co morbidities that complicate the patient evaluation  HLD, OSA, HTN, GERD, obesity, arthritis, CAD, anemia, asthma   Additional history obtained:  Additional history obtained from EMS External records from outside source obtained and reviewed including EMR   Lab Tests:  I Ordered, and personally interpreted labs.  The pertinent results include: (Pending at time of signout)   Imaging Studies ordered:  I ordered imaging studies including chest x-ray I independently visualized and interpreted imaging which showed no acute findings I agree with the radiologist interpretation   Cardiac Monitoring: / EKG:  The patient was maintained on a cardiac  monitor.  I personally viewed and interpreted the cardiac monitored which showed an underlying rhythm of: Sinus rhythm  Problem List / ED Course / Critical interventions / Medication management  Patient with history of CAD presenting for chest tightness and palpitations.  Palpitations resolved prior to EMS arrival on scene.  She did receive 324 ASA prior to arrival.  No other interventions were given.  Last doses of blood pressure medications were this morning.  On arrival in the ED, she does endorse some ongoing mild chest tightness.  EKG shows no ST segment abnormalities.  Patient's blood pressure was elevated.  NTG was ordered.  Diagnostic workup was initiated.  Care of patient was signed out to oncoming ED provider.. I ordered medication including NTG for chest tightness and hypertension Reevaluation of the patient after these medicines showed that the patient improved I have reviewed the patients home medicines and have made adjustments as needed   Social Determinants of Health:  ***   Test / Admission - Considered:  ***   {Document critical care time when appropriate:1} {Document review of labs and clinical decision tools ie heart score,  Chads2Vasc2 etc:1}  {Document your independent review of radiology images, and any outside records:1} {Document your discussion with family members, caretakers, and with consultants:1} {Document social determinants of health affecting pt's care:1} {Document your decision making why or why not admission, treatments were needed:1} Final Clinical Impression(s) / ED Diagnoses Final diagnoses:  None    Rx / DC Orders ED Discharge Orders     None

## 2022-11-13 ENCOUNTER — Other Ambulatory Visit: Payer: Self-pay | Admitting: Internal Medicine

## 2022-11-13 DIAGNOSIS — M0580 Other rheumatoid arthritis with rheumatoid factor of unspecified site: Secondary | ICD-10-CM

## 2022-11-24 ENCOUNTER — Encounter: Payer: Self-pay | Admitting: Student

## 2022-11-24 ENCOUNTER — Ambulatory Visit (INDEPENDENT_AMBULATORY_CARE_PROVIDER_SITE_OTHER): Payer: Medicare Other | Admitting: Student

## 2022-11-24 VITALS — BP 178/71 | HR 56 | Ht 68.0 in | Wt 272.6 lb

## 2022-11-24 DIAGNOSIS — R001 Bradycardia, unspecified: Secondary | ICD-10-CM | POA: Insufficient documentation

## 2022-11-24 DIAGNOSIS — I10 Essential (primary) hypertension: Secondary | ICD-10-CM

## 2022-11-24 DIAGNOSIS — R0789 Other chest pain: Secondary | ICD-10-CM

## 2022-11-24 NOTE — Patient Instructions (Signed)
Thank you, Ms.Naysa A Wolanski for allowing [place] to provide your care today. Today we discussed  Esophageal discomfort I believe you may be having episodes of esophageal spasms and would recommend following up with the GI doctors. If your symptoms worsen or new symptoms occur prior to seeing them, please give [place] a call. You continue the maalox and over the counter acid medicines if they are helping with your symptoms.   Low heart rate Please purchase an arm blood pressure cuff and check your pressures and heart reate mulitple times a day. If you begin having persistent episodes of low heart rate, please call our clinic. Continue your blood pressure medications.   I have ordered the following labs for you:  Lab Orders  No laboratory test(s) ordered today     Referrals ordered today:   Referral Orders         Ambulatory referral to Gastroenterology      I have ordered the following medication/changed the following medications:   Stop the following medications: There are no discontinued medications.   Start the following medications: No orders of the defined types were placed in this encounter.    Follow up: 1 month blood pressure follow up or sooner if pressures are consistently elevated    Should you have any questions or concerns please call the internal medicine clinic at [phone].    Sanjuana Letters, D.O. [place]

## 2022-11-24 NOTE — Assessment & Plan Note (Addendum)
Patient presents today with chest discomfort for the past month. She describes the sensation as pounding of her esophagus. It began after eating foods she does not regularly consume. The sensation persisted and she presented to the ED for further evaluation. The pain is located in the center of her chest and goes down into the upper abdomen. She has noticed it is exacerbated by different food and improved with maalox given in the ED. Troponins were negative and EKG without acute changes aside from PVC's.   The feeling has improved but persists. Famotidine prescribed at discharged has not improved her symptoms. She has been avoiding triggering foods. Her last endoscopy was in 2013 and she was found to have some polyps. Last seen by Dr. Havery Moros in 2021, at that time her GERD sx had improved and she was instructed to follow up as needed. I believe her current symptoms are consistent with esophageal spasms and warrant further evaluation by GI. With her history of GERD and age, she is high risk for esophageal malignancy, webb, and stricture. She denies having dysphagia, odynophagia, or dyspepsia. Low suspician for cardiac etiology, she denies symptoms feeling similar to prior MI.  -continue maalox and OTC acid suppressing medications -follow up with GI for further evaluation, new referral placed

## 2022-11-24 NOTE — Assessment & Plan Note (Signed)
Endorses episodes of bradycardia with HR in 50-60's at home. She is asymptomatic. She has noticed these episodes this with finger pulse ox reader. Her HR improves when she gets up and moves around. On exam today she is in normal sinus rhythm with a HR of 70. EKG at ED one month ago HR of 75.   Recommended she check heart rates with brachial blood pressure monitor and assess if persistent episodes of bradycardia.   -follow up one month HR readings -instructed to follow up sooner if becomes symptomatic or bradycardia worsens and becomes persistent

## 2022-11-24 NOTE — Assessment & Plan Note (Signed)
BP elevated today 178/71. Home regimen of losartan 100 mg daily and lopressor 25 mg daily. Over the past year has had fluctuating blood pressures, some at goal. Recommended she purchase a home brachial cuff rather than wrist cuff. She will return in one month with blood pressure readings, if still elevated would recommend addition of thiazide diuretic. She has lost significant weight over the last few years but would address to make certain not still having OSA sx at follow up -continue losartan 100 mg daily and metoprolol 25 mg daily -follow up home BP readings and add thiazide if needed -reassess for OSA

## 2022-11-24 NOTE — Progress Notes (Signed)
CC: chest discomfort and hypertension  HPI:  [name] is a 67 y.o. female living with a history stated below and presents today for chest discomfort and hypertension. Please see problem based assessment and plan for additional details.  Past Medical History:  Diagnosis Date   Acute urinary retention s/p Foley 01/07/2012   Allergy    Anemia    Asthma    CAD (coronary artery disease)    a. 12/2013 s/p CABG x 5 Dr. Darcey Nora (LIMA to LAD, SVG to diagonal, SVG to OM1, SVG to OM2, SVG to PDA)   Carotid arterial disease ([place])    a. 12/2013 Carotid U/S: 1-39% bilat ICA stenosis.   Carpal tunnel syndrome, bilateral    GERD (gastroesophageal reflux disease)    Hemorrhoids, internal, with bleeding & prolapse 12/13/2011   Hernia, abdominal    History of blood transfusion    CABG and hysterectomy   History of kidney stones    Hyperlipidemia    Hypertension    Iron deficiency 12/07/2011   Keloid scar    a. Sternal Keloid s/p CABG with ongoing pain.   Morbid obesity ([place])    Myocardial infarct ([place])    2015   Neuropathy    Osteoarthritis    Osteoporosis    PONV (postoperative nausea and vomiting)    Seasonal allergies    Sleep apnea    no CPAP machine; sleep study 02/2010 REM AHI 61.7/hr, total sleep REM 14.8/hr   Tooth abscess    Uterine cancer ([place]) 05/2014   clinical stage IA grade 1 endometrioid endometrial cancer    Current Outpatient Medications on File Prior to Visit  Medication Sig Dispense Refill   albuterol (PROVENTIL HFA;VENTOLIN HFA) 108 (90 Base) MCG/ACT inhaler Inhale 2 puffs into the lungs every 6 (six) hours as needed for wheezing or shortness of breath. 1 Inhaler 0   alum & mag hydroxide-simeth (MAALOX PLUS) 400-400-40 MG/5ML suspension Take 15 mLs by mouth every 6 (six) hours as needed for indigestion. 355 mL 0   Cholecalciferol (VITAMIN D HIGH POTENCY) 25 MCG (1000 UT) capsule Take 1,000 Units by mouth daily.     famotidine (PEPCID) 20 MG tablet Take 1  tablet (20 mg total) by mouth 2 (two) times daily. 10 tablet 0   folic acid (FOLVITE) 1 MG tablet Take 1 tablet (1 mg total) by mouth daily. 90 tablet 0   losartan (COZAAR) 100 MG tablet TAKE 1 TABLET BY MOUTH EVERY DAY 90 tablet 3   methotrexate 50 MG/2ML injection Inject 0.4 mLs (10 mg total) into the skin once a week. 4 mL 1   metoprolol tartrate (LOPRESSOR) 25 MG tablet TAKE 1 AND 1/2 TABLETS BY MOUTH TWICE A DAY 270 tablet 0   Multiple Vitamin (MULTIVITAMIN WITH MINERALS) TABS tablet Take 1 tablet by mouth every Monday, Wednesday, and Friday.     nystatin cream (MYCOSTATIN) Apply 1 application  topically 2 (two) times daily as needed (rash). 30 g 1   Polyethyl Glycol-Propyl Glycol (SYSTANE FREE OP) Apply 1 tablet to eye daily as needed (For dry eyes).     TUBERCULIN SYR 1CC/26GX3/8" 26G X 3/8" 1 ML MISC For injection of subcutaneous methotrexate once weekly 25 each 0   No current facility-administered medications on file prior to visit.    Family History  Problem Relation Age of Onset   Heart attack Mother 71   Heart disease Mother    Hypertension Mother    Diabetes Mother    Hypertension  Sister    Heart failure Sister    Heart disease Sister    Diabetes Sister    Kidney disease Sister    Kidney disease Brother    Hypertension Daughter    Bipolar disorder Son    Schizophrenia Son    Anxiety disorder Son    Esophageal cancer Other        nephew   Colon cancer Neg Hx    Pancreatic cancer Neg Hx    Stomach cancer Neg Hx    Liver disease Neg Hx    Rectal cancer Neg Hx     Social History   Socioeconomic History   Marital status: Single    Spouse name: Not on file   Number of children: 2   Years of education: Not on file   Highest education level: Not on file  Occupational History   Occupation: Retired    Fish farm manager: [employer]    Comment: Chiropodist  Tobacco Use   Smoking status: Never    Passive exposure: Never   Smokeless tobacco: Never  Vaping Use    Vaping Use: Never used  Substance and Sexual Activity   Alcohol use: No    Alcohol/week: 0.0 standard drinks of alcohol   Drug use: No   Sexual activity: Not Currently  Other Topics Concern   Not on file  Social History Narrative   Current Social History 08/27/2020        Patient lives alone in a handicap-accessible ground floor apartment which is 1 story. There are not steps up to the entrance the patient uses.       Patient's method of transportation is personal car or family member.      The highest level of education was 2 years of college.      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 [employer]      Right Handed      Identified important Relationships are My grandchildren, daughter, son       Pets : None       Interests / Fun: Plants       Current Stressors: Son with special needs (Bi-polar/schizophrenia) , he is not living with her        Religious / Personal Beliefs: [employer] mentors (Vegan videos)      [place], RN,BSN          Social Determinants of Health   Financial Resource Strain: Low Risk  (07/26/2021)   Overall Financial Resource Strain (CARDIA)    Difficulty of Paying Living Expenses: Not hard at all  Food Insecurity: No Food Insecurity (07/26/2021)   Hunger Vital Sign    Worried About Running Out of Food in the Last Year: Never true    [place] in the Last Year: Never true  Transportation Needs: No Transportation Needs (11/03/2022)   PRAPARE - Hydrologist (Medical): No    Lack of Transportation (Non-Medical): No  Physical Activity: Insufficiently Active (07/26/2021)   Exercise Vital Sign    Days of Exercise per Week: 3 days    Minutes of Exercise per Session: 20 min  Stress: Not on file  Social Connections: Not on file  Intimate Partner Violence: Not on file   Review of Systems: ROS negative except for what is noted on the assessment and plan.  Vitals:   11/24/22 1439 11/24/22  1444  BP: (!) 161/70 (!) 178/71  Pulse: 71 (!) 56  SpO2: 99%  Weight: 272 lb 9.6 oz (123.7 kg)   Height: '5\' 8"'$  (1.727 m)     Physical Exam: Constitutional: in no acute distress HENT: normocephalic atraumatic Eyes: conjunctiva non-erythematous Neck: supple Cardiovascular: regular rate and rhythm, no m/r/g Pulmonary/Chest: normal work of breathing on room air, lungs clear to auscultation bilaterally MSK: normal bulk and tone Neurological: alert & oriented x 3 Skin: warm and dry Psych: normal mood  Assessment & Plan:   Chest discomfort Patient presents today with chest discomfort for the past month. She describes the sensation as pounding of her esophagus. It began after eating foods she does not regularly consume. The sensation persisted and she presented to the ED for further evaluation. The pain is located in the center of her chest and goes down into the upper abdomen. She has noticed it is exacerbated by different food and improved with maalox given in the ED. Troponins were negative and EKG without acute changes aside from PVC's.   The feeling has improved but still persists and occurs with certain foods. Famotidine prescribed at discharged has not improved her symptoms. She has been avoiding triggering foods. Her last endoscopy was in 2013 and she was found to have some polyps. Last seen by Dr. Havery Moros in 2021, at that time her GERD sx had improved and she was instructed to follow up as needed. I believe her current symptoms are consistent with esophageal spasms and warrant further evaluation by GI. With her history of GERD and age, she is high risk for esophageal malignancy, webb, and stricture. She denies having dysphagia, odynophagia, or dyspepsia. Low suspician for cardiac etiology, she denies symptoms feeling similar to prior MI.  -continue maalox and OTC acid suppressing medications -follow up with GI for further evaluation, new referral placed  Essential hypertension BP  elevated today 178/71. Home regimen of losartan 100 mg daily and lopressor 25 mg daily. Over the past year has had fluctuating blood pressures, some at goal. Recommended she purchase a home brachial cuff rather than wrist cuff. She will return in one month with blood pressure readings, if still elevated would recommend addition of thiazide diuretic. She has lost significant weight over the last few years but would address to make certain not still having OSA sx at follow up -continue losartan 100 mg daily and metoprolol 25 mg daily -follow up home BP readings and add thiazide if needed -reassess for OSA  Bradycardia Endorses episodes of bradycardia with HR in 50-60's at home. She is asymptomatic. She has noticed these episodes this with finger pulse ox reader. Her HR improves when she gets up and moves around. On exam today she is in normal sinus rhythm with a HR of 70. EKG at ED one month ago HR of 75.   Recommended she check heart rates with brachial blood pressure monitor and assess if persistent episodes of bradycardia.   -follow up one month HR readings -instructed to follow up sooner if becomes symptomatic or bradycardia worsens and becomes persistent  Patient discussed with Dr. Laurena Slimmer, D.O. [facility], PGY-3 Phone: [phone] Date 11/24/2022 Time 8:23 PM

## 2022-11-25 NOTE — Progress Notes (Signed)
Internal Medicine Clinic Attending  I saw and evaluated the patient.  I personally confirmed the key portions of the history and exam documented by Dr. Katsadouros and I reviewed pertinent patient test results.  The assessment, diagnosis, and plan were formulated together and I agree with the documentation in the resident's note.  

## 2022-11-25 NOTE — Addendum Note (Signed)
Addended by: Lalla Brothers T on: 11/25/2022 09:10 AM   Modules accepted: Level of Service

## 2022-11-26 DIAGNOSIS — H53021 Refractive amblyopia, right eye: Secondary | ICD-10-CM | POA: Diagnosis not present

## 2022-11-26 DIAGNOSIS — H15111 Episcleritis periodica fugax, right eye: Secondary | ICD-10-CM | POA: Diagnosis not present

## 2022-11-26 DIAGNOSIS — H25812 Combined forms of age-related cataract, left eye: Secondary | ICD-10-CM | POA: Diagnosis not present

## 2022-11-26 DIAGNOSIS — H43823 Vitreomacular adhesion, bilateral: Secondary | ICD-10-CM | POA: Diagnosis not present

## 2022-11-26 DIAGNOSIS — Z961 Presence of intraocular lens: Secondary | ICD-10-CM | POA: Diagnosis not present

## 2022-11-26 DIAGNOSIS — H04123 Dry eye syndrome of bilateral lacrimal glands: Secondary | ICD-10-CM | POA: Diagnosis not present

## 2022-12-09 ENCOUNTER — Other Ambulatory Visit: Payer: Self-pay | Admitting: Internal Medicine

## 2022-12-17 ENCOUNTER — Ambulatory Visit: Payer: Medicare Other | Admitting: Internal Medicine

## 2022-12-20 NOTE — Progress Notes (Unsigned)
[facility]   Subjective:     Chief Complaint: No chief complaint on file.  History of Present Illness: [name] is a 67 y.o. female with stage III pelvic organ prolapse and stress incontinence who presents for a pessary check. She is using a size #5 cube pessary. The pessary has been working well and she has no complaints. She {ACTION; IS/IS NOB:[id number]} using vaginal estrogen. She denies vaginal bleeding.  Past Medical History: Patient  has a past medical history of Acute urinary retention s/p Foley (01/07/2012), Allergy, Anemia, Asthma, CAD (coronary artery disease), Carotid arterial disease ([place]), Carpal tunnel syndrome, bilateral, GERD (gastroesophageal reflux disease), Hemorrhoids, internal, with bleeding & prolapse (12/13/2011), Hernia, abdominal, History of blood transfusion, History of kidney stones, Hyperlipidemia, Hypertension, Iron deficiency (12/07/2011), Keloid scar, Morbid obesity ([place]), Myocardial infarct ([place]), Neuropathy, Osteoarthritis, Osteoporosis, PONV (postoperative nausea and vomiting), Seasonal allergies, Sleep apnea, Tooth abscess, and Uterine cancer ([place]) (05/2014).   Past Surgical History: She  has a past surgical history that includes Knee surgery (Bilateral, 1999); Total knee arthroplasty (Left, 04/29/2011); Wisdom tooth extraction; Colonoscopy (2009); Knee arthroscopy (1991); transanal hemorrhoidal dearterliaization (01/06/2012); Coronary artery bypass graft (N/A, 01/09/2014); Intraoprative transesophageal echocardiogram (N/A, 01/09/2014); TEE without cardioversion (01/2010); NM MYOCAR PERF WALL MOTION (01/2010); Cardiac catheterization; Multiple extractions with alveoloplasty (N/A, 04/11/2014); Vaginal hysterectomy (N/A, 06/03/2014); Unilateral salpingectomy (Right, 06/03/2014); left heart catheterization with coronary angiogram (N/A, 01/04/2014); Breast reduction surgery (Bilateral, 10/10/2015); Colonoscopy w/ biopsies and polypectomy; Lesion  excision with complex repair (N/A, 05/07/2016); Cataract extraction (Right); Artery Biopsy (Left, 07/10/2021); and bladder tack.   Medications: She has a current medication list which includes the following prescription(s): albuterol, alum & mag hydroxide-simeth, vitamin d high potency, famotidine, folic acid, losartan, methotrexate, metoprolol tartrate, multivitamin with minerals, nystatin cream, polyethyl glycol-propyl glycol, and tuberculin syr 1cc/26gx3/8".   Allergies: Patient is allergic to cephalosporins, eicosapentaenoic acid (epa), fish-derived products, peanuts [peanut oil], shrimp [shellfish allergy], latex, metronidazole, methotrexate derivatives, ciprofloxacin, lisinopril, naproxen, other, and prednisone.   Social History: Patient  reports that she has never smoked. She has never been exposed to tobacco smoke. She has never used smokeless tobacco. She reports that she does not drink alcohol and does not use drugs.      Objective:    Physical Exam: LMP 01/13/2014 Comment: spotting in Feb and bleeding since April Gen: No apparent distress, A&O x 3. Detailed Urogynecologic Evaluation:  Pelvic Exam: Normal external female genitalia; Bartholin's and Skene's glands normal in appearance; urethral meatus {urethra:24773}, no urethral masses or discharge. The pessary was noted to be {in place:24774}. It was removed and cleaned. Speculum exam revealed {vaginal lesions:24775} in the vagina. The pessary was replaced. It was comfortable to the patient and fit well.       No data to display          Laboratory Results: Urine dipstick shows: {ua dip:315374::"negative for all components"}.    Assessment/Plan:    Assessment: [name] is a 67 y.o. with {PFD symptoms:24771} here for a pessary check. She is doing well.  Plan: She will {pessary plan:24776}. She will continue to use {lubricant:24777}. She will follow-up in *** {days/wks/mos/yrs:310907} for a pessary check or sooner as  needed.  All questions were answered.   Time Spent:

## 2022-12-21 ENCOUNTER — Ambulatory Visit (INDEPENDENT_AMBULATORY_CARE_PROVIDER_SITE_OTHER): Payer: 59 | Admitting: Obstetrics and Gynecology

## 2022-12-21 ENCOUNTER — Encounter: Payer: Self-pay | Admitting: Obstetrics and Gynecology

## 2022-12-21 VITALS — BP 176/85 | HR 50

## 2022-12-21 DIAGNOSIS — N993 Prolapse of vaginal vault after hysterectomy: Secondary | ICD-10-CM

## 2022-12-21 DIAGNOSIS — R350 Frequency of micturition: Secondary | ICD-10-CM | POA: Diagnosis not present

## 2022-12-21 DIAGNOSIS — I10 Essential (primary) hypertension: Secondary | ICD-10-CM | POA: Diagnosis not present

## 2022-12-21 DIAGNOSIS — N8110 Cystocele, unspecified: Secondary | ICD-10-CM | POA: Diagnosis not present

## 2022-12-21 NOTE — Patient Instructions (Signed)
Follow up with your PCP about your blood pressure.  I hope your procedure goes well for your esophagus.   If you have any issues with the pessary that you feel it is not working well please call and we will get you in for an appointment.

## 2022-12-23 ENCOUNTER — Encounter: Payer: Self-pay | Admitting: Nurse Practitioner

## 2022-12-23 ENCOUNTER — Ambulatory Visit (INDEPENDENT_AMBULATORY_CARE_PROVIDER_SITE_OTHER): Payer: 59 | Admitting: Nurse Practitioner

## 2022-12-23 VITALS — BP 160/90 | HR 50 | Ht 68.0 in | Wt 267.0 lb

## 2022-12-23 DIAGNOSIS — R0789 Other chest pain: Secondary | ICD-10-CM

## 2022-12-23 MED ORDER — OMEPRAZOLE 20 MG PO CPDR: 20.0000 mg | DELAYED_RELEASE_CAPSULE | Freq: Every day | ORAL | 2 refills | Status: DC

## 2022-12-23 NOTE — Progress Notes (Signed)
Assessment    Patient profile:  [name] is a 67 y.o. female known to Dr. Havery Moros with a past medical history of GERD, colon polyps, diverticulosis, hypertension, CAD status post remote CABG.  See PMH /PSH for additional history.  Patient is being seen at request of her PCP for possible esophageal spasms   # 67 yo female with history of GERD without [name]'s esophagus.  Off GERD medication for a few years and has done relatively well except for occasional heartburn relieved with Tums.   # Chest "fluttering" Symptoms exacerbated by eating but occurs in fasting state as well.  No associated shortness of breath.  ED workup unremarkable .  Symptoms improved but not resolved after 5 days of Pepcid .  Patient feels she is having esophageal spasms but symptoms are atypical for this.  Symptoms are not typical for reflux either.     Plan   Schedule for EGD. The risks and benefits of EGD with possible biopsies were discussed with the patient who agrees to proceed.  Short course of famotidine caused constipation so we will avoid it. Trial of omeprazole 20 mg 30 minutes before breakast.  Will ask patient to contact us with the name of her new rheumatoid arthritis medication so that we can add it to her home med list  HPI    Chief complaint: esophageal spasms.    [name] was last seen here 10/21/2020 for follow-up on GERD and constipation.  She was doing well on Nexium but wanted to get off PPI due to concerns about chronic use plan was for her to titrate down the Nexium she was taking.  For constipation she was continued on MiraLAX and Metamucil as needed  Interval History:  [name] did stop Nexium and did fine with only occasional heartburn relieved with Tums. In December she developed fluttering in her chest which lasted a couple of weeks.  She describes the fluttering as feeling like spasms though she had no associated chest discomfort.  Spasm-like pain was often caused by eating though she  could also have it in the middle of the night.  The fluttering sensation can last all day.  The fluttering was worse with certain foods such as processed sugar.  It was also worse if she bent over after eating or if she overate.  She had no associated shortness of breath.  She had no dysphagia.  She was seen in the ED on 11/02/2022 for evaluation of her symptoms  There was no concern for ACS. ED gave her 5 days of Pepcid which helped but didn't alleviate the symptoms and it caused constipation. Currently not taking anything for the symptoms other than occasional mylanta.   [name] recently started a new medication fo RA but she doesn't know the name of it.   ED Evaluation:  C-Met, lipase, CBC were all okay.  Troponin normal Chest x-ray without active disease  EKG Date/Time:                  Tuesday November 02 2022 22:02:09 EST Ventricular Rate:         75 PR Interval:                 174 QRS Duration: 86 QT Interval:                 348 QTC Calculation:        388 R Axis:  17 Text Interpretation:      Sinus rhythm with occasional Premature ventricular complexes Cannot rule out Anterior infarct , age undetermined Abnormal ECG Confirmed by Godfrey Pick [phone]) on 11/02/2022 10:05:39 PM    Previous GI Evaluation   EGD in 2013 showed no evidence of [name]'s esophagus or high risk pathology.    Colonoscopy 09/15/20 - The perianal and digital rectal examinations were normal. - A few small-mouthed diverticula were found in the sigmoid colon and ascending colon. - A 4 mm polyp was found in the transverse colon. The polyp was sessile. The polyp was removed with a cold snare. Resection and retrieval were complete. - A 3 mm polyp was found in the descending colon. The polyp was sessile. The polyp was removed with a cold snare. Resection and retrieval were complete. - Internal hemorrhoids were found during retroflexion. The hemorrhoids were small. Scarring from prior  hemorrhoidectomy noted. - The colon was tortous. The exam was otherwise without abnormality.   Surgical [P], colon, descending and transverse, polyp (2) - TUBULAR ADENOMA (TWO). - NO HIGH GRADE DYSPLASIA OR CARCINOMA.   Repeat colonoscopy in 7 years  Labs:     Latest Ref Rng & Units 11/02/2022   11:25 PM 10/19/2022    3:34 PM 08/20/2022    2:53 PM  CBC  WBC 4.0 - 10.5 K/uL 7.0  5.3  5.6   Hemoglobin 12.0 - 15.0 g/dL 13.2  14.2  13.1   Hematocrit 36.0 - 46.0 % 37.7  42.5  40.0   Platelets 150 - 400 K/uL 188  220  209        Latest Ref Rng & Units 11/02/2022   11:25 PM 10/19/2022    3:34 PM 08/20/2022    2:53 PM  Hepatic Function  Total Protein 6.5 - 8.1 g/dL 6.9  7.8  7.2   Albumin 3.5 - 5.0 g/dL 3.4     AST 15 - 41 U/L '15  12  11   '$ ALT 0 - 44 U/L '10  6  6   '$ Alk Phosphatase 38 - 126 U/L 43     Total Bilirubin 0.3 - 1.2 mg/dL 0.2  0.4  0.3   Bilirubin, Direct 0.0 - 0.2 mg/dL 0.1        Past Medical History:  Diagnosis Date   Acute urinary retention s/p Foley 01/07/2012   Allergy    Anemia    Asthma    CAD (coronary artery disease)    a. 12/2013 s/p CABG x 5 Dr. Darcey Nora (LIMA to LAD, SVG to diagonal, SVG to OM1, SVG to OM2, SVG to PDA)   Carotid arterial disease ([place])    a. 12/2013 Carotid U/S: 1-39% bilat ICA stenosis.   Carpal tunnel syndrome, bilateral    GERD (gastroesophageal reflux disease)    Hemorrhoids, internal, with bleeding & prolapse 12/13/2011   Hernia, abdominal    History of blood transfusion    CABG and hysterectomy   History of kidney stones    Hyperlipidemia    Hypertension    Iron deficiency 12/07/2011   Keloid scar    a. Sternal Keloid s/p CABG with ongoing pain.   Morbid obesity ([place])    Myocardial infarct ([place])    2015   Neuropathy    Osteoarthritis    Osteoporosis    PONV (postoperative nausea and vomiting)    Seasonal allergies    Sleep apnea    no CPAP machine; sleep study 02/2010 REM AHI 61.7/hr, total sleep REM 14.8/hr  Tooth  abscess    Uterine cancer ([place]) 05/2014   clinical stage IA grade 1 endometrioid endometrial cancer    Past Surgical History:  Procedure Laterality Date   ARTERY BIOPSY Left 07/10/2021   Procedure: LEFT TEMPORAL ARTERY BIOPSY;  Surgeon: Serafina Mitchell, MD;  Location: [facility] OR;  Service: Vascular;  Laterality: Left;   bladder tack     BREAST REDUCTION SURGERY Bilateral 10/10/2015   Procedure: BILATERAL MAMMARY REDUCTION  (BREAST) WITH FREE NIPPLE GRAFT;  Surgeon: Irene Limbo, MD;  Location: [facility];  Service: Plastics;  Laterality: Bilateral;   CARDIAC CATHETERIZATION     CATARACT EXTRACTION Right    COLONOSCOPY  2009   COLONOSCOPY W/ BIOPSIES AND POLYPECTOMY     CORONARY ARTERY BYPASS GRAFT N/A 01/09/2014   Procedure: CORONARY ARTERY BYPASS GRAFTING (CABG) x 5 using left internal mammary artery and right leg greater saphenous vein harvested endoscopically;  Surgeon: Ivin Poot, MD;  Location: [facility];  Service: Open Heart Surgery;  Laterality: N/A;  please use bed extenders and breast binder   INTRAOPERATIVE TRANSESOPHAGEAL ECHOCARDIOGRAM N/A 01/09/2014   Procedure: INTRAOPERATIVE TRANSESOPHAGEAL ECHOCARDIOGRAM;  Surgeon: Ivin Poot, MD;  Location: [facility];  Service: Open Heart Surgery;  Laterality: N/A;   KNEE ARTHROSCOPY  1991   KNEE SURGERY Bilateral 1999   LEFT HEART CATHETERIZATION WITH CORONARY ANGIOGRAM N/A 01/04/2014   Procedure: LEFT HEART CATHETERIZATION WITH CORONARY ANGIOGRAM;  Surgeon: Sinclair Grooms, MD;  Location: [facility] CATH LAB;  Service: Cardiovascular;  Laterality: N/A;   LESION EXCISION WITH COMPLEX REPAIR N/A 05/07/2016   Procedure: COMPLEX REPAIR OF CHEST 16 CM;  Surgeon: Irene Limbo, MD;  Location: [place] [facility];  Service: Plastics;  Laterality: N/A;   MULTIPLE EXTRACTIONS WITH ALVEOLOPLASTY N/A 04/11/2014   Procedure: Extraction of tooth #'s 1,2,8,16 with alveoloplasty, maxillary tuberosity reductions, and gross debridement of remaining teeth.;  Surgeon: Lenn Cal, DDS;  Location: [facility];  Service: Oral Surgery;  Laterality: N/A;   NM MYOCAR PERF WALL MOTION  01/2010   dipyridamole myoview - moderate perfusion defect in basal inferoseptal, basal inferior, mid inferoseptal, mid inferior, apical inferior region; EF 56%   TEE WITHOUT CARDIOVERSION  01/2010   EF 60-65%, small, flat, non-infiltrating, calcified, fixed apical/septal mass   TOTAL KNEE ARTHROPLASTY Left 04/29/2011   transanal hemorrhoidal dearterliaization  01/06/2012   with external hemorrhoid removal   UNILATERAL SALPINGECTOMY Right 06/03/2014   Procedure: UNILATERAL SALPINGECTOMY;  Surgeon: Lavonia Drafts, MD;  Location: [facility];  Service: Gynecology;  Laterality: Right;   VAGINAL HYSTERECTOMY N/A 06/03/2014   Procedure: HYSTERECTOMY VAGINAL;  Surgeon: Lavonia Drafts, MD;  Location: [facility];  Service: Gynecology;  Laterality: N/A;   WISDOM TOOTH EXTRACTION      Current Medications, Allergies, Family History and Social History were reviewed in [employer] record.     Current Outpatient Medications  Medication Sig Dispense Refill   albuterol (PROVENTIL HFA;VENTOLIN HFA) 108 (90 Base) MCG/ACT inhaler Inhale 2 puffs into the lungs every 6 (six) hours as needed for wheezing or shortness of breath. 1 Inhaler 0   alum & mag hydroxide-simeth (MAALOX PLUS) 400-400-40 MG/5ML suspension Take 15 mLs by mouth every 6 (six) hours as needed for indigestion. 355 mL 0   Cholecalciferol (VITAMIN D HIGH POTENCY) 25 MCG (1000 UT) capsule Take 1,000 Units by mouth daily.     folic acid (FOLVITE) 1 MG tablet Take 1 tablet (1 mg total) by mouth daily. 90 tablet 0  losartan (COZAAR) 100 MG tablet TAKE 1 TABLET BY MOUTH EVERY DAY 90 tablet 3   metoprolol tartrate (LOPRESSOR) 25 MG tablet TAKE 1 AND 1/2 TABLETS BY MOUTH TWICE A DAY 270 tablet 0   Multiple Vitamin (MULTIVITAMIN WITH MINERALS) TABS tablet Take 1 tablet by mouth every Monday, Wednesday, and Friday.      nystatin cream (MYCOSTATIN) Apply 1 application  topically 2 (two) times daily as needed (rash). 30 g 1   omeprazole (PRILOSEC) 20 MG capsule Take 1 capsule (20 mg total) by mouth daily before breakfast. 30 capsule 2   Polyethyl Glycol-Propyl Glycol (SYSTANE FREE OP) Apply 1 tablet to eye daily as needed (For dry eyes).     famotidine (PEPCID) 20 MG tablet Take 1 tablet (20 mg total) by mouth 2 (two) times daily. (Patient not taking: Reported on 12/23/2022) 10 tablet 0   TUBERCULIN SYR 1CC/26GX3/8" 26G X 3/8" 1 ML MISC For injection of subcutaneous methotrexate once weekly (Patient not taking: Reported on 12/23/2022) 25 each 0   No current facility-administered medications for this visit.    Review of Systems: No weight loss.  No shortness of breath. No urinary complaints.    Physical Exam  Wt Readings from Last 3 Encounters:  11/24/22 272 lb 9.6 oz (123.7 kg)  11/02/22 270 lb 15.1 oz (122.9 kg)  10/19/22 271 lb (122.9 kg)    BP 160 /90, HR 50  Constitutional:  Pleasant obese female in no acute distress. Psychiatric: Pleasant. Normal mood and affect. Behavior is normal. EENT: Pupils normal.  Conjunctivae are normal. No scleral icterus. Neck supple.  Cardiovascular: Normal rate, regular rhythm.  Pulmonary/chest: Effort normal and breath sounds normal. No wheezing, rales or rhonchi. Abdominal: Soft, nondistended, nontender. Bowel sounds active throughout. There are no masses palpable. No hepatomegaly. Neurological: Alert and oriented to person place and time. Musculoskeletal:  Skin: Skin is warm and dry. No rashes noted.  Tye Savoy, NP  12/23/2022, 8:30 AM  Cc:  Lalla Brothers Marcello Moores

## 2022-12-23 NOTE — Patient Instructions (Addendum)
We have sent the following medications to your pharmacy for you to pick up at your convenience:   Omeprazole 20 mg - Take 1 tablet 30 Minutes prior to breakfast.  You have been scheduled for an endoscopy. Please follow written instructions given to you at your visit today. If you use inhalers (even only as needed), please bring them with you on the day of your procedure.   _______________________________________________________  If your blood pressure at your visit was 140/90 or greater, please contact your primary care physician to follow up on this.  _______________________________________________________  If you are age 69 or older, your body mass index should be between 23-30. Your Body mass index is 40.6 kg/m. If this is out of the aforementioned range listed, please consider follow up with your Primary Care Provider.  __________________________________________________________  The [facility] providers would like to encourage you to use [facility] to communicate with providers for non-urgent requests or questions.  Due to long hold times on the telephone, sending your provider a message by [facility] may be a faster and more efficient way to get a response.  Please allow 48 business hours for a response.  Please remember that this is for non-urgent requests.    Due to recent changes in healthcare laws, you may see the results of your imaging and laboratory studies on MyChart before your provider has had a chance to review them.  We understand that in some cases there may be results that are confusing or concerning to you. Not all laboratory results come back in the same time frame and the provider may be waiting for multiple results in order to interpret others.  Please give [place] 48 hours in order for your provider to thoroughly review all the results before contacting the office for clarification of your results.     Thank you for choosing me and [facility].  Tye Savoy,  NP.

## 2022-12-23 NOTE — Progress Notes (Signed)
Agree with assessment and plan as outlined.  

## 2022-12-27 ENCOUNTER — Other Ambulatory Visit: Payer: Self-pay | Admitting: Internal Medicine

## 2022-12-27 ENCOUNTER — Other Ambulatory Visit: Payer: Self-pay | Admitting: Nurse Practitioner

## 2022-12-27 ENCOUNTER — Other Ambulatory Visit: Payer: Self-pay

## 2022-12-27 DIAGNOSIS — M0580 Other rheumatoid arthritis with rheumatoid factor of unspecified site: Secondary | ICD-10-CM

## 2022-12-27 DIAGNOSIS — Z8709 Personal history of other diseases of the respiratory system: Secondary | ICD-10-CM

## 2022-12-27 NOTE — Telephone Encounter (Signed)
Next Visit: 01/19/2023  Last Visit: 10/19/2022  Last Fill: 10/19/2022  DX: Polyarthritis with positive rheumatoid factor   Current Dose per office note on 10/19/2022: Will recommend switch to subcutaneous methotrexate injection 10 mg subcu once weekly   Labs: 11/02/2022 CBC WNL Hepatic function panel: albumin 3.4, total bilirubin 0.2, indirect bilirubin 0.1 BMP: glucose 110  Okay to refill MTX?

## 2022-12-28 MED ORDER — ALBUTEROL SULFATE HFA 108 (90 BASE) MCG/ACT IN AERS: 2.0000 | INHALATION_SPRAY | Freq: Four times a day (QID) | RESPIRATORY_TRACT | 0 refills | Status: DC | PRN

## 2023-01-10 ENCOUNTER — Ambulatory Visit (INDEPENDENT_AMBULATORY_CARE_PROVIDER_SITE_OTHER): Payer: 59 | Admitting: Student in an Organized Health Care Education/Training Program

## 2023-01-10 ENCOUNTER — Encounter: Payer: Self-pay | Admitting: *Deleted

## 2023-01-10 ENCOUNTER — Ambulatory Visit (INDEPENDENT_AMBULATORY_CARE_PROVIDER_SITE_OTHER): Payer: 59

## 2023-01-10 ENCOUNTER — Ambulatory Visit (HOSPITAL_COMMUNITY)
Admission: RE | Admit: 2023-01-10 | Discharge: 2023-01-10 | Disposition: A | Discharge status: Home / Self Care | Payer: 59 | Source: Ambulatory Visit | Attending: Student in an Organized Health Care Education/Training Program | Admitting: Student in an Organized Health Care Education/Training Program

## 2023-01-10 ENCOUNTER — Encounter: Payer: Self-pay | Admitting: Student in an Organized Health Care Education/Training Program

## 2023-01-10 VITALS — BP 172/75 | HR 54 | Temp 98.0°F | Ht 68.0 in | Wt 272.2 lb

## 2023-01-10 DIAGNOSIS — M1711 Unilateral primary osteoarthritis, right knee: Secondary | ICD-10-CM | POA: Insufficient documentation

## 2023-01-10 DIAGNOSIS — I2510 Atherosclerotic heart disease of native coronary artery without angina pectoris: Secondary | ICD-10-CM

## 2023-01-10 DIAGNOSIS — Z0000 Encounter for general adult medical examination without abnormal findings: Secondary | ICD-10-CM | POA: Diagnosis not present

## 2023-01-10 DIAGNOSIS — I10 Essential (primary) hypertension: Secondary | ICD-10-CM

## 2023-01-10 DIAGNOSIS — K219 Gastro-esophageal reflux disease without esophagitis: Secondary | ICD-10-CM | POA: Diagnosis not present

## 2023-01-10 DIAGNOSIS — M0580 Other rheumatoid arthritis with rheumatoid factor of unspecified site: Secondary | ICD-10-CM | POA: Diagnosis not present

## 2023-01-10 MED ORDER — ROSUVASTATIN CALCIUM 20 MG PO TABS: 20.0000 mg | ORAL_TABLET | Freq: Every day | ORAL | 3 refills | Status: DC

## 2023-01-10 MED ORDER — ASPIRIN 81 MG PO TBEC: 81.0000 mg | DELAYED_RELEASE_TABLET | Freq: Every day | ORAL | 2 refills | Status: AC

## 2023-01-10 MED ORDER — AMLODIPINE BESYLATE 5 MG PO TABS: 5.0000 mg | ORAL_TABLET | Freq: Every day | ORAL | 3 refills | Status: DC

## 2023-01-10 NOTE — Assessment & Plan Note (Signed)
Chronic and stable. Hand symptoms are much improved with good mobility. This is being managed by Dr. Benjamine Mola. Patient is using injectable methotrexate at a low dose, labs looked appropriate in December. She has follow up with Dr. Benjamine Mola next week.

## 2023-01-10 NOTE — Assessment & Plan Note (Addendum)
Worsening pain of the right knee the last several months that has become function limiting. She reports prior arthroscopic surgery on this knee with Dr. Myrtie Neither in [place] about 10 years ago. She had the left knee replaced with good result. On exam she has no effusion, she does have a varus deformity of the knee. We tried steroid injections in the past with some benefit, but I suspect the knee disease is too advanced for this to help. Will get xrays today and refer to ortho to consider another knee replacement surgery. Current BMI is on the borderline at 41. Other medical conditions are stable.  Addendum: xray shows severe medial bone on bone arthritis. We discussed that further steroid injections or medical therapy will not improve this situation. She will follow up with orthopedics to discuss knee replacement. I encouraged weight loss, she is motivated and set a goal for herself to lose 25 lbs before the surgery.

## 2023-01-10 NOTE — Assessment & Plan Note (Signed)
Patient with recent chest discomfort and cough sensation consistent with possible esophageal disease. At risk for esophageal web. She has an EGD scheduled for next week.

## 2023-01-10 NOTE — Progress Notes (Signed)
Subjective:   [name] is a 67 y.o. female who presents for Medicare Annual (Subsequent) preventive examination. I connected with  [name] on 01/10/23 by a  Face-To-Face  enabled telemedicine application and verified that I am speaking with the correct person using two identifiers.  Patient Location: Other:  Office/Clinic  Provider Location: Office/Clinic  I discussed the limitations of evaluation and management by telemedicine. The patient expressed understanding and agreed to proceed.  Review of Systems    Defer to PCP       Objective:    Today's Vitals   01/10/23 1132 01/10/23 1133  BP: (!) 172/75   Pulse: (!) 54   Temp: 98 F (36.7 C)   TempSrc: Oral   SpO2: 99%   Weight: 272 lb 3.2 oz (123.5 kg)   Height: 5' 8"$  (1.727 m)   PainSc:  6    Body mass index is 41.39 kg/m.     01/10/2023   11:34 AM 01/10/2023   10:04 AM 11/24/2022    2:43 PM 11/02/2022    9:58 PM 08/13/2022   11:00 AM 08/03/2022    7:28 PM 05/03/2022   11:54 AM  Advanced Directives  Does Patient Have a Medical Advance Directive? No No No Yes No No No  Does patient want to make changes to medical advance directive? No - Patient declined No - Patient declined Yes (MAU/Ambulatory/Procedural Areas - Information given) No - Patient declined     Would patient like information on creating a medical advance directive? No - Patient declined No - Patient declined   Yes (MAU/Ambulatory/Procedural Areas - Information given) No - Patient declined No - Patient declined    Current Medications (verified) Outpatient Encounter Medications as of 01/10/2023  Medication Sig   albuterol (VENTOLIN HFA) 108 (90 Base) MCG/ACT inhaler Inhale 2 puffs into the lungs every 6 (six) hours as needed for wheezing or shortness of breath.   alum & mag hydroxide-simeth (MAALOX PLUS) 400-400-40 MG/5ML suspension Take 15 mLs by mouth every 6 (six) hours as needed for indigestion.   amLODipine (NORVASC) 5 MG tablet Take 1 tablet  (5 mg total) by mouth daily.   Cholecalciferol (VITAMIN D HIGH POTENCY) 25 MCG (1000 UT) capsule Take 1,000 Units by mouth daily.   famotidine (PEPCID) 20 MG tablet Take 1 tablet (20 mg total) by mouth 2 (two) times daily. (Patient not taking: Reported on 0000000)   folic acid (FOLVITE) 1 MG tablet Take 1 tablet (1 mg total) by mouth daily.   losartan (COZAAR) 100 MG tablet TAKE 1 TABLET BY MOUTH EVERY DAY   methotrexate 50 MG/2ML injection Inject 0.4 mLs (10 mg total) into the skin once a week.   metoprolol tartrate (LOPRESSOR) 25 MG tablet TAKE 1 AND 1/2 TABLETS BY MOUTH TWICE A DAY   Multiple Vitamin (MULTIVITAMIN WITH MINERALS) TABS tablet Take 1 tablet by mouth every Monday, Wednesday, and Friday.   nystatin cream (MYCOSTATIN) Apply 1 application  topically 2 (two) times daily as needed (rash).   omeprazole (PRILOSEC) 20 MG capsule TAKE 1 CAPSULE BY MOUTH DAILY BEFORE BREAKFAST.   Polyethyl Glycol-Propyl Glycol (SYSTANE FREE OP) Apply 1 tablet to eye daily as needed (For dry eyes).   TUBERCULIN SYR 1CC/26GX3/8" 26G X 3/8" 1 ML MISC For injection of subcutaneous methotrexate once weekly (Patient not taking: Reported on 12/23/2022)   No facility-administered encounter medications on file as of 01/10/2023.    Allergies (verified) Cephalosporins, Eicosapentaenoic acid (epa), Fish-derived products, Peanuts [peanut  oil], Shrimp [shellfish allergy], Latex, Metronidazole, Methotrexate derivatives, Ciprofloxacin, Lisinopril, Naproxen, Other, and Prednisone   History: Past Medical History:  Diagnosis Date   Acute urinary retention s/p Foley 01/07/2012   Allergy    Anemia    Asthma    CAD (coronary artery disease)    a. 12/2013 s/p CABG x 5 Dr. Darcey Nora (LIMA to LAD, SVG to diagonal, SVG to OM1, SVG to OM2, SVG to PDA)   Carotid arterial disease ([place])    a. 12/2013 Carotid U/S: 1-39% bilat ICA stenosis.   Carpal tunnel syndrome, bilateral    GERD (gastroesophageal reflux disease)     Hemorrhoids, internal, with bleeding & prolapse 12/13/2011   Hernia, abdominal    History of blood transfusion    CABG and hysterectomy   History of kidney stones    Hyperlipidemia    Hypertension    Iron deficiency 12/07/2011   Keloid scar    a. Sternal Keloid s/p CABG with ongoing pain.   Morbid obesity ([place])    Myocardial infarct ([employer])    2015   Neuropathy    Osteoarthritis    Osteoporosis    PONV (postoperative nausea and vomiting)    Seasonal allergies    Sleep apnea    no CPAP machine; sleep study 02/2010 REM AHI 61.7/hr, total sleep REM 14.8/hr   Tooth abscess    Uterine cancer ([place]) 05/2014   clinical stage IA grade 1 endometrioid endometrial cancer   Past Surgical History:  Procedure Laterality Date   ARTERY BIOPSY Left 07/10/2021   Procedure: LEFT TEMPORAL ARTERY BIOPSY;  Surgeon: Serafina Mitchell, MD;  Location: [place];  Service: Vascular;  Laterality: Left;   bladder tack     BREAST REDUCTION SURGERY Bilateral 10/10/2015   Procedure: BILATERAL MAMMARY REDUCTION  (BREAST) WITH FREE NIPPLE GRAFT;  Surgeon: Irene Limbo, MD;  Location: [place];  Service: Plastics;  Laterality: Bilateral;   CARDIAC CATHETERIZATION     CATARACT EXTRACTION Right    COLONOSCOPY  2009   COLONOSCOPY W/ BIOPSIES AND POLYPECTOMY     CORONARY ARTERY BYPASS GRAFT N/A 01/09/2014   Procedure: CORONARY ARTERY BYPASS GRAFTING (CABG) x 5 using left internal mammary artery and right leg greater saphenous vein harvested endoscopically;  Surgeon: Ivin Poot, MD;  Location: [place];  Service: Open Heart Surgery;  Laterality: N/A;  please use bed extenders and breast binder   INTRAOPERATIVE TRANSESOPHAGEAL ECHOCARDIOGRAM N/A 01/09/2014   Procedure: INTRAOPERATIVE TRANSESOPHAGEAL ECHOCARDIOGRAM;  Surgeon: Ivin Poot, MD;  Location: [place];  Service: Open Heart Surgery;  Laterality: N/A;   KNEE ARTHROSCOPY  1991   KNEE SURGERY Bilateral 1999   LEFT HEART CATHETERIZATION WITH CORONARY ANGIOGRAM N/A  01/04/2014   Procedure: LEFT HEART CATHETERIZATION WITH CORONARY ANGIOGRAM;  Surgeon: Sinclair Grooms, MD;  Location[facility] C[facility] LAB;  Service: Cardiovascular;  Laterality: N/A;   LESION EXCISION WITH COMPLEX REPAIR N/A 05/07/2016   Procedure: COMPLEX REPAIR OF CHEST 16 CM;  Surgeon: Irene Limbo, MD;  Location: [place];  Service: Plastics;  Laterality: N/A;   MULTIPLE EXTRACTIONS WITH ALVEOLOPLASTY N/A 04/11/2014   Procedure: Extraction of tooth #'s 1,2,8,16 with alveoloplasty, maxillary tuberosity reductions, and gross debridement of remaining teeth.;  Surgeon: Lenn Cal, DDS;  Location: [place];  Service: Oral Surgery;  Laterality: N/A;   NM MYOCAR PERF WALL MOTION  01/2010   dipyridamole myoview - moderate perfusion defect in basal inferoseptal, basal inferior, mid inferoseptal, mid inferior, apical inferior region; EF 56%  TEE WITHOUT CARDIOVERSION  01/2010   EF 60-65%, small, flat, non-infiltrating, calcified, fixed apical/septal mass   TOTAL KNEE ARTHROPLASTY Left 04/29/2011   transanal hemorrhoidal dearterliaization  01/06/2012   with external hemorrhoid removal   UNILATERAL SALPINGECTOMY Right 06/03/2014   Procedure: UNILATERAL SALPINGECTOMY;  Surgeon: Lavonia Drafts, MD;  Location: [facility];  Service: Gynecology;  Laterality: Right;   VAGINAL HYSTERECTOMY N/A 06/03/2014   Procedure: HYSTERECTOMY VAGINAL;  Surgeon: Lavonia Drafts, MD;  Location: [facility];  Service: Gynecology;  Laterality: N/A;   WISDOM TOOTH EXTRACTION     Family History  Problem Relation Age of Onset   Heart attack Mother 73   Heart disease Mother    Hypertension Mother    Diabetes Mother    Hypertension Sister    Heart failure Sister    Heart disease Sister    Diabetes Sister    Kidney disease Sister    Kidney disease Brother    Hypertension Daughter    Bipolar disorder Son    Schizophrenia Son    Anxiety disorder Son    Esophageal cancer Other        nephew   Colon cancer Neg Hx     Pancreatic cancer Neg Hx    Stomach cancer Neg Hx    Liver disease Neg Hx    Rectal cancer Neg Hx    Social History   Socioeconomic History   Marital status: Single    Spouse name: Not on file   Number of children: 2   Years of education: Not on file   Highest education level: Not on file  Occupational History   Occupation: Retired    Fish farm manager: [employer]    Comment: Chiropodist  Tobacco Use   Smoking status: Never    Passive exposure: Never   Smokeless tobacco: Never  Vaping Use   Vaping Use: Never used  Substance and Sexual Activity   Alcohol use: No    Alcohol/week: 0.0 standard drinks of alcohol   Drug use: No   Sexual activity: Not Currently  Other Topics Concern   Not on file  Social History Narrative   Current Social History 08/27/2020        Patient lives alone in a handicap-accessible ground floor apartment which is 1 story. There are not steps up to the entrance the patient uses.       Patient's method of transportation is personal car or family member.      The highest level of education was 2 years of college.      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 [employer]      Right Handed      Identified important Relationships are My grandchildren, daughter, son       Pets : None       Interests / Fun: Plants       Current Stressors: Son with special needs (Bi-polar/schizophrenia) , he is not living with her        Religious / Personal Beliefs: You Tube mentors (Vegan videos)      [place], RN,BSN          Social Determinants of Health   Financial Resource Strain: Low Risk  (01/10/2023)   Overall Financial Resource Strain (CARDIA)    Difficulty of Paying Living Expenses: Not hard at all  Food Insecurity: No Food Insecurity (01/10/2023)   Hunger Vital Sign    Worried About Running Out of Food in the Last Year: Never true  Ran Out of Food in the Last Year: Never true  Transportation Needs: No  Transportation Needs (01/10/2023)   PRAPARE - Hydrologist (Medical): No    Lack of Transportation (Non-Medical): No  Physical Activity: Insufficiently Active (01/10/2023)   Exercise Vital Sign    Days of Exercise per Week: 2 days    Minutes of Exercise per Session: 10 min  Stress: No Stress Concern Present (01/10/2023)   [place]    Feeling of Stress : Only a little  Social Connections: Moderately Integrated (01/10/2023)   Social Connection and Isolation Panel [NHANES]    Frequency of Communication with Friends and Family: More than three times a week    Frequency of Social Gatherings with Friends and Family: Once a week    Attends Religious Services: More than 4 times per year    Active Member of [employer] or Organizations: Yes    Attends Music therapist: More than 4 times per year    Marital Status: Divorced    Tobacco Counseling Counseling given: Not Answered   Clinical Intake:  Pre-visit preparation completed: Yes  Pain : 0-10 Pain Score: 6  Pain Type: Chronic pain Pain Location: Knee Pain Orientation: Right Pain Descriptors / Indicators: Aching Pain Onset: More than a month ago Pain Frequency: Intermittent Effect of Pain on Daily Activities: pain when walking     Nutritional Risks: None Diabetes: No  How often do you need to have someone help you when you read instructions, pamphlets, or other written materials from your doctor or pharmacy?: 1 - Never  Diabetic?No  Interpreter Needed?: No  Information entered by :: Corey Skains Kass Herberger,cma 01/10/23 11:34am   Activities of Daily Living    01/10/2023   10:04 AM 08/13/2022   10:57 AM  In your present state of health, do you have any difficulty performing the following activities:  Hearing? 0 0  Vision? 0   Difficulty concentrating or making decisions? 0 0  Walking or climbing stairs? 1 1  Comment  Little  stifeness of legs in the morning  Dressing or bathing? 0 0  Doing errands, shopping? 0 1  Comment  Occassionaly she needs someone to help her    Patient Care Team: Axel Filler, MD as PCP - General (Internal Medicine) Debara Pickett Nadean Corwin, MD as PCP - Cardiology (Cardiology) Inda Castle, MD (Inactive) as Consulting Physician (Gastroenterology) Warden Fillers, MD as Consulting Physician (Ophthalmology) Earlie Server, MD as Consulting Physician (Orthopedic Surgery) Alda Berthold, DO as Consulting Physician (Neurology)  Indicate any recent Medical Services you may have received from other than [facility] providers in the past year (date may be approximate).     Assessment:   This is a routine wellness examination for Jonnetta.  Hearing/Vision screen No results found.  Dietary issues and exercise activities discussed:     Goals Addressed   None   Depression Screen    01/10/2023   11:35 AM 08/13/2022   10:57 AM 05/03/2022   11:54 AM 01/11/2022   10:03 AM 09/07/2021   10:45 AM 08/13/2021    9:58 AM 07/29/2021    4:11 PM  PHQ 2/9 Scores  PHQ - 2 Score 0 0 0 0 0 0 0  PHQ- 9 Score      0     Fall Risk    01/10/2023   11:34 AM 01/10/2023   10:03 AM 11/24/2022    2:39 PM 08/13/2022  10:57 AM 05/03/2022   11:02 AM  Fall Risk   Falls in the past year? 0 0 0 0 0  Number falls in past yr: 0 0 0 0 0  Injury with Fall? 0 0 0 0 0  Risk for fall due to : No Fall Risks  No Fall Risks No Fall Risks   Follow up Falls evaluation completed;Falls prevention discussed Falls evaluation completed Falls evaluation completed Falls evaluation completed;Falls prevention discussed Falls evaluation completed    FALL RISK PREVENTION PERTAINING TO THE HOME:  Any stairs in or around the home? No  If so, are there any without handrails? No  Home free of loose throw rugs in walkways, pet beds, electrical cords, etc? Yes  Adequate lighting in your home to reduce risk of falls? Yes   ASSISTIVE  DEVICES UTILIZED TO PREVENT FALLS:  Life alert? No  Use of a cane, walker or w/c? Yes  Grab bars in the bathroom? Yes  Shower chair or bench in shower? Yes  Elevated toilet seat or a handicapped toilet? Yes   TIMED UP AND GO:  Was the test performed? Yes .  Length of time to ambulate 10 feet: 1 min.   Gait slow and steady with assistive device  Cognitive Function:        01/10/2023   11:35 AM 07/26/2021    4:51 PM  6CIT Screen  What Year? 0 points 0 points  What month? 0 points 0 points  What time? 0 points 0 points  Count back from 20 0 points 0 points  Months in reverse 0 points 0 points  Repeat phrase 0 points 6 points  Total Score 0 points 6 points    Immunizations Immunization History  Administered Date(s) Administered   Influenza Split 01/07/2012   Influenza Whole 10/31/2007, 09/18/2009, 09/25/2010   Influenza, Seasonal, Injecte, Preservative Fre 11/28/2012   Influenza,inj,Quad PF,6+ Mos 08/07/2013, 01/02/2014, 08/01/2015, 09/20/2016, 09/05/2017, 10/16/2018   Influenza-Unspecified 09/07/2021   PFIZER(Purple Top)SARS-COV-2 Vaccination 04/10/2020, 05/05/2020   Pneumococcal Conjugate-13 07/14/2020   Pneumococcal Polysaccharide-23 01/07/2012, 10/24/2017   Td 10/15/2009   Tdap 02/23/2021    TDAP status: Up to date  Flu Vaccine status: Due, Education has been provided regarding the importance of this vaccine. Advised may receive this vaccine at local pharmacy or Health Dept. Aware to provide a copy of the vaccination record if obtained from local pharmacy or Health Dept. Verbalized acceptance and understanding.  Pneumococcal vaccine status: Due, Education has been provided regarding the importance of this vaccine. Advised may receive this vaccine at local pharmacy or Health Dept. Aware to provide a copy of the vaccination record if obtained from local pharmacy or Health Dept. Verbalized acceptance and understanding.  Covid-19 vaccine status: Completed  vaccines  Qualifies for Shingles Vaccine? No   Zostavax completed No   Shingrix Completed?: No.    Education has been provided regarding the importance of this vaccine. Patient has been advised to call insurance company to determine out of pocket expense if they have not yet received this vaccine. Advised may also receive vaccine at local pharmacy or Health Dept. Verbalized acceptance and understanding.  Screening Tests Health Maintenance  Topic Date Due   Zoster Vaccines- Shingrix (1 of 2) Never done   COVID-19 Vaccine (3 - Pfizer risk series) 06/02/2020   INFLUENZA VACCINE  06/22/2022   Pneumonia Vaccine 43+ Years old (3 of 3 - PPSV23 or PCV20) 10/24/2022   MAMMOGRAM  01/06/2024   Medicare Annual Wellness (AWV)  01/11/2024  COLONOSCOPY (Pts 45-20yr Insurance coverage will need to be confirmed)  09/16/2027   DTaP/Tdap/Td (3 - Td or Tdap) 02/24/2031   DEXA SCAN  Completed   Hepatitis C Screening  Completed   HPV VACCINES  Aged Out    Health Maintenance  Health Maintenance Due  Topic Date Due   Zoster Vaccines- Shingrix (1 of 2) Never done   COVID-19 Vaccine (3 - Pfizer risk series) 06/02/2020   INFLUENZA VACCINE  06/22/2022   Pneumonia Vaccine 69 Years old (3 of 3 - PPSV23 or PCV20) 10/24/2022    Colorectal cancer screening: Type of screening: Colonoscopy. Completed 09/15/2020. Repeat every 7 years  Mammogram status: Ordered 08/13/2022. Pt provided with contact info and advised to call to schedule appt.   Bone Density status: Completed 02/18/2010. Results reflect: Bone density results: OSTEOPOROSIS. Repeat every 0 years.  Lung Cancer Screening: (Low Dose CT Chest recommended if Age 69-80years, 30 pack-year currently smoking OR have quit w/in 15years.) does not qualify.   Lung Cancer Screening Referral: N/A  Additional Screening:  Hepatitis C Screening: does not qualify; Completed 09/05/2017  Vision Screening: Recommended annual ophthalmology exams for early detection  of glaucoma and other disorders of the eye. Is the patient up to date with their annual eye exam?  Yes  Who is the provider or what is the name of the office in which the patient attends annual eye exams? Dr.Groat If pt is not established with a provider, would they like to be referred to a provider to establish care? No .   Dental Screening: Recommended annual dental exams for proper oral hygiene  Community Resource Referral / Chronic Care Management: CRR required this visit?  No   CCM required this visit?  No      Plan:     I have personally reviewed and noted the following in the patient's chart:   Medical and social history Use of alcohol, tobacco or illicit drugs  Current medications and supplements including opioid prescriptions. Patient is currently taking opioid prescriptions. Information provided to patient regarding non-opioid alternatives. Patient advised to discuss non-opioid treatment plan with their provider. Functional ability and status Nutritional status Physical activity Advanced directives List of other physicians Hospitalizations, surgeries, and ER visits in previous 12 months Vitals Screenings to include cognitive, depression, and falls Referrals and appointments  In addition, I have reviewed and discussed with patient certain preventive protocols, quality metrics, and best practice recommendations. A written personalized care plan for preventive services as well as general preventive health recommendations were provided to patient.     JKerin Perna [facility]  01/10/2023   Nurse Notes: Face-To-Face Visit  Ms. Petrides , Thank you for taking time to come for your Medicare Wellness Visit. I appreciate your ongoing commitment to your health goals. Please review the following plan we discussed and let me know if I can assist you in the future.   These are the goals we discussed:  Goals       Blood Pressure < 140/90      Exercise 3x per week (20 min per time)       Increase physical activity (pt-stated)      LDL CALC < 130      Weight (lb) < 273 lb (123.8 kg) (pt-stated)      7% weight loss        This is a list of the screening recommended for you and due dates:  Health Maintenance  Topic Date Due   Zoster (  Shingles) Vaccine (1 of 2) Never done   COVID-19 Vaccine (3 - Pfizer risk series) 06/02/2020   Flu Shot  06/22/2022   Pneumonia Vaccine (3 of 3 - PPSV23 or PCV20) 10/24/2022   Mammogram  01/06/2024   Medicare Annual Wellness Visit  01/11/2024   Colon Cancer Screening  09/16/2027   DTaP/Tdap/Td vaccine (3 - Td or Tdap) 02/24/2031   DEXA scan (bone density measurement)  Completed   Hepatitis C Screening: USPSTF Recommendation to screen - Ages 41-79 yo.  Completed   HPV Vaccine  Aged Out

## 2023-01-10 NOTE — Assessment & Plan Note (Signed)
Blood pressure very elevated today, chronic and asymptomatic. She stopped taking losartan due to possible cough, which I think is likely from reflux rather than the ARB. We talked about options, she does not want to restart a diuretic due to urinary incontinence. Instead, we will restart losartan 135m daily, has been off only a few days, and restart amlodipine 527mdaily. Follow up with me in 4-6 weeks for blood pressure recheck.

## 2023-01-10 NOTE — Progress Notes (Signed)
Established Patient Office Visit  Subjective   Patient ID: [name], female    DOB: 09-06-1956  Age: 67 y.o. MRN: [id number]  Chief Complaint  Patient presents with   Knee Pain   Cough   Medication Problem    HPI  67 year old person living with rheumatoid arthritis here for follow up of hypertension. She is doing well since I last saw her in January. Her chest discomfort is improved, she followed up with GI and has an EGD planned for next week. Her biggest concern today is right knee pain for the last few months that has worsened and become function limiting. She is now using a crutch to get around, cannot bear weight on a cane due to RA in her hands. She lives alone, has help from her family but is independent in all her activities.     Objective:     BP (!) 172/75 (BP Location: Right Arm, Patient Position: Sitting, Cuff Size: Small) Comment: patient did not take medicine this morning  Pulse (!) 54   Temp 98 F (36.7 C) (Oral)   Ht 5' 8"$  (1.727 m)   Wt 272 lb 3.2 oz (123.5 kg)   LMP 01/13/2014 Comment: spotting in Feb and bleeding since April  SpO2 99%   BMI 41.39 kg/m    Physical Exam  Gen: well appearing woman, no distress Neck: no lymphadenopathy, no thyromegaly CV: RRR, 2/6 early systolic murmur at the RUSB Abd: soft, non tender Ext: warm well perfused, right knee without effusion, no crepitus, there is medial joint line tenderness and a varus deformity.    Assessment & Plan:   Problem List Items Addressed This Visit       High   Coronary atherosclerosis of native coronary artery (Chronic)    Chronic and stable after bypass grafting. No angina and good functional status. Will continue with aspirin 29m daily, rosuvastatin 277mdaily, and metoprolol 37.34m50mid.       Relevant Medications   amLODipine (NORVASC) 5 MG tablet   aspirin EC 81 MG tablet   rosuvastatin (CRESTOR) 20 MG tablet   Polyarthritis with positive rheumatoid factor (HCC) - Primary  (Chronic)    Chronic and stable. Hand symptoms are much improved with good mobility. This is being managed by Dr. RicBenjamine Molaatient is using injectable methotrexate at a low dose, labs looked appropriate in December. She has follow up with Dr. RicBenjamine Molaxt week.        Medium    Osteoarthritis of right knee (Chronic)    Worsening pain of the right knee the last several months that has become function limiting. She reports prior arthroscopic surgery on this knee with Dr. ArtMarily Memos GreLesslieout 10 years ago. She had the left knee replaced with good result. On exam she has no effusion, she does have a varus deformity of the knee. We tried steroid injections in the past with some benefit, but I suspect the knee disease is too advanced for this to help. Will get xrays today and refer to ortho to consider another knee replacement surgery. Current BMI is on the borderline at 41. Other medical conditions are stable.  Addendum: xray shows severe medial bone on bone arthritis. We discussed that further steroid injections or medical therapy will not improve this situation. She will follow up with orthopedics to discuss knee replacement. I encouraged weight loss, she is motivated and set a goal for herself to lose 25 lbs before the surgery.  Relevant Medications   aspirin EC 81 MG tablet   Other Relevant Orders   DG Knee Complete 4 Views Right (Completed)   Ambulatory referral to Orthopedic Surgery   Essential hypertension (Chronic)    Blood pressure very elevated today, chronic and asymptomatic. She stopped taking losartan due to possible cough, which I think is likely from reflux rather than the ARB. We talked about options, she does not want to restart a diuretic due to urinary incontinence. Instead, we will restart losartan 148m daily, has been off only a few days, and restart amlodipine 534mdaily. Follow up with me in 4-6 weeks for blood pressure recheck.       Relevant Medications    amLODipine (NORVASC) 5 MG tablet   aspirin EC 81 MG tablet   rosuvastatin (CRESTOR) 20 MG tablet     Low   GERD (Chronic)    Patient with recent chest discomfort and cough sensation consistent with possible esophageal disease. At risk for esophageal web. She has an EGD scheduled for next week.       Return in about 3 months (around 04/10/2023).    DuAxel FillerMD

## 2023-01-10 NOTE — Assessment & Plan Note (Signed)
Chronic and stable after bypass grafting. No angina and good functional status. Will continue with aspirin 62m daily, rosuvastatin 257mdaily, and metoprolol 37.64m92mid.

## 2023-01-17 ENCOUNTER — Other Ambulatory Visit: Payer: Self-pay | Admitting: Student in an Organized Health Care Education/Training Program

## 2023-01-18 ENCOUNTER — Encounter: Payer: Self-pay | Admitting: Gastroenterology

## 2023-01-18 ENCOUNTER — Ambulatory Visit (AMBULATORY_SURGERY_CENTER): Payer: 59 | Admitting: Gastroenterology

## 2023-01-18 VITALS — BP 168/67 | HR 62 | Temp 96.9°F | Resp 15 | Ht 68.0 in | Wt 267.0 lb

## 2023-01-18 DIAGNOSIS — K219 Gastro-esophageal reflux disease without esophagitis: Secondary | ICD-10-CM

## 2023-01-18 DIAGNOSIS — R0789 Other chest pain: Secondary | ICD-10-CM | POA: Diagnosis not present

## 2023-01-18 DIAGNOSIS — K319 Disease of stomach and duodenum, unspecified: Secondary | ICD-10-CM | POA: Diagnosis not present

## 2023-01-18 DIAGNOSIS — K317 Polyp of stomach and duodenum: Secondary | ICD-10-CM

## 2023-01-18 DIAGNOSIS — K449 Diaphragmatic hernia without obstruction or gangrene: Secondary | ICD-10-CM

## 2023-01-18 MED ORDER — SODIUM CHLORIDE 0.9 % IV SOLN: 500.0000 mL | Freq: Once | INTRAVENOUS | Status: DC

## 2023-01-18 NOTE — Progress Notes (Signed)
Patient has small amount of bloody blistery area at the base of her left front bottom tooth. Asked patient to wiggle tooth to see if it is loose, she does and states it is not loose. CRNA notified and will stop by to evaluate this in recovery prior to discharge home.

## 2023-01-18 NOTE — Progress Notes (Signed)
History and Physical Interval Note: Patient seen on 12/23/22 - no interval changes. On omeprazole now for chest symptoms. On PPI remotely for GERD and then on pepcid PRN which did not control symptoms. She thinks omeprazole has helped since we have last seen her. EGD to further evaluate. Otherwise feels well today without complaints.    01/18/2023 3:29 PM  [name]  has presented today for endoscopic procedure(s), with the diagnosis of  Encounter Diagnoses  Name Primary?   Other chest pain Yes   Gastroesophageal reflux disease, unspecified whether esophagitis present   .  The various methods of evaluation and treatment have been discussed with the patient and/or family. After consideration of risks, benefits and other options for treatment, the patient has consented to  the endoscopic procedure(s).   The patient's history has been reviewed, patient examined, no change in status, stable for surgery.  I have reviewed the patient's chart and labs.  Questions were answered to the patient's satisfaction.    Jolly Mango, MD [facility]

## 2023-01-18 NOTE — Progress Notes (Signed)
Sedate, gd SR, tolerated procedure well, VSS, report to RN 

## 2023-01-18 NOTE — Op Note (Signed)
[facility] Patient Name: [name] Procedure Date: 01/18/2023 3:25 PM MRN: [id number] Endoscopist: Remo Lipps P. Havery Moros , MD, [id number] Age: 67 Referring MD:  Date of Birth: 11/28/1955 Gender: Female Account #: [id number] Procedure:                Upper GI endoscopy Indications:              Suspected gastro-esophageal reflux disease,                            Atypical chest pain - occured on pepcid, now on                            omeprazole '20mg'$  / day and doing better Medicines:                Monitored Anesthesia Care Procedure:                Pre-Anesthesia Assessment:                           - Prior to the procedure, a History and Physical                            was performed, and patient medications and                            allergies were reviewed. The patient's tolerance of                            previous anesthesia was also reviewed. The risks                            and benefits of the procedure and the sedation                            options and risks were discussed with the patient.                            All questions were answered, and informed consent                            was obtained. Prior Anticoagulants: The patient has                            taken no anticoagulant or antiplatelet agents. ASA                            Grade Assessment: III - A patient with severe                            systemic disease. After reviewing the risks and                            benefits, the patient was deemed in satisfactory  condition to undergo the procedure.                           After obtaining informed consent, the endoscope was                            passed under direct vision. Throughout the                            procedure, the patient's blood pressure, pulse, and                            oxygen saturations were monitored continuously. The                            GIF [id number]  [id number] was introduced through the                            mouth, and advanced to the second part of duodenum.                            The upper GI endoscopy was accomplished without                            difficulty. The patient tolerated the procedure                            well. Scope In: Scope Out: Findings:                 Esophagogastric landmarks were identified: the                            Z-line was found at 35 cm, the gastroesophageal                            junction was found at 35 cm and the site of hiatal                            narrowing was found at 37 cm from the incisors.                           A 2 cm hiatal hernia was present.                           The exam of the esophagus was otherwise normal. No                            erosive changes or [name]'s esophagus.                           Patchy mildly erythematous mucosa was found in the  gastric antrum. Biopsies were taken with a cold                            forceps for Helicobacter pylori testing.                           Multiple small sessile polyps were found in the                            gastric fundus and in the gastric body. Suspect                            likely fundic gland vs. small hyperplastic polyps.                            Biopsies were taken with a cold forceps for                            histology to ensure no adenomatous changes.                           The exam of the stomach was otherwise normal.                           The examined duodenum was normal. Complications:            No immediate complications. Estimated blood loss:                            Minimal. Estimated Blood Loss:     Estimated blood loss was minimal. Impression:               - Esophagogastric landmarks identified.                           - 2 cm hiatal hernia.                           - Normal esophagus otherwise - no erosive changes                             or [name]'s                           - Erythematous mucosa in the antrum. Biopsied.                           - Multiple gastric polyps. Biopsied. Suspect benign                            fundic gland polyps.                           - Normal stomach otherwise.                           -  Normal examined duodenum.                           Given response to omeprazole, reflux is the likely                            cause of her symptoms. Recommendation:           - Patient has a contact number available for                            emergencies. The signs and symptoms of potential                            delayed complications were discussed with the                            patient. Return to normal activities tomorrow.                            Written discharge instructions were provided to the                            patient.                           - Resume previous diet.                           - Continue present medications.                           - Await pathology results. Remo Lipps P. Havery Moros, MD 01/18/2023 3:49:56 PM This report has been signed electronically.

## 2023-01-18 NOTE — Progress Notes (Signed)
Pt's states no medical or surgical changes since previsit or office visit. 

## 2023-01-18 NOTE — Patient Instructions (Signed)
YOU HAD AN ENDOSCOPIC PROCEDURE TODAY AT [facility]:   Refer to the procedure report that was given to you for any specific questions about what was found during the examination.  If the procedure report does not answer your questions, please call your gastroenterologist to clarify.  If you requested that your care partner not be given the details of your procedure findings, then the procedure report has been included in a sealed envelope for you to review at your convenience later.  YOU SHOULD EXPECT: Some feelings of bloating in the abdomen. Passage of more gas than usual.  Walking can help get rid of the air that was put into your GI tract during the procedure and reduce the bloating. If you had a lower endoscopy (such as a colonoscopy or flexible sigmoidoscopy) you may notice spotting of blood in your stool or on the toilet paper. If you underwent a bowel prep for your procedure, you may not have a normal bowel movement for a few days.  Please Note:  You might notice some irritation and congestion in your nose or some drainage.  This is from the oxygen used during your procedure.  There is no need for concern and it should clear up in a day or so.  SYMPTOMS TO REPORT IMMEDIATELY:   Following upper endoscopy (EGD)  Vomiting of blood or coffee ground material  New chest pain or pain under the shoulder blades  Painful or persistently difficult swallowing  New shortness of breath  Fever of 100F or higher  Black, tarry-looking stools  For urgent or emergent issues, a gastroenterologist can be reached at any hour by calling [phone]. Do not use MyChart messaging for urgent concerns.    DIET:  We do recommend a small meal at first, but then you may proceed to your regular diet.  Drink plenty of fluids but you should avoid alcoholic beverages for 24 hours.  MEDICATIONS: Continue present medications.  HANDOUTS GIVEN TO PATIENT: Hiatal Hernia (GERD).  FOLLOW UP: Await  pathology results. Dr. Havery Moros will notify you after he has reviewed the pathology results with any further follow-up instructions.  Thank you for allowing [place] to provide for your healthcare needs today.   ACTIVITY:  You should plan to take it easy for the rest of today and you should NOT DRIVE or use heavy machinery until tomorrow (because of the sedation medicines used during the test).    FOLLOW UP: Our staff will call the number listed on your records the next business day following your procedure.  We will call around 7:15- 8:00 am to check on you and address any questions or concerns that you may have regarding the information given to you following your procedure. If we do not reach you, we will leave a message.     If any biopsies were taken you will be contacted by phone or by letter within the next 1-3 weeks.  Please call us at [phone] if you have not heard about the biopsies in 3 weeks.    SIGNATURES/CONFIDENTIALITY: You and/or your care partner have signed paperwork which will be entered into your electronic medical record.  These signatures attest to the fact that that the information above on your After Visit Summary has been reviewed and is understood.  Full responsibility of the confidentiality of this discharge information lies with you and/or your care-partner.

## 2023-01-19 ENCOUNTER — Ambulatory Visit: Payer: 59 | Attending: Internal Medicine | Admitting: Internal Medicine

## 2023-01-19 ENCOUNTER — Telehealth: Payer: Self-pay | Admitting: *Deleted

## 2023-01-19 ENCOUNTER — Encounter: Payer: Self-pay | Admitting: Internal Medicine

## 2023-01-19 VITALS — BP 132/65 | HR 54 | Resp 12 | Ht 68.0 in | Wt 270.0 lb

## 2023-01-19 DIAGNOSIS — M19012 Primary osteoarthritis, left shoulder: Secondary | ICD-10-CM

## 2023-01-19 DIAGNOSIS — M0580 Other rheumatoid arthritis with rheumatoid factor of unspecified site: Secondary | ICD-10-CM

## 2023-01-19 DIAGNOSIS — M1711 Unilateral primary osteoarthritis, right knee: Secondary | ICD-10-CM

## 2023-01-19 DIAGNOSIS — Z79899 Other long term (current) drug therapy: Secondary | ICD-10-CM | POA: Diagnosis not present

## 2023-01-19 NOTE — Progress Notes (Signed)
Office Visit Note  Patient: [name]             Date of Birth: 08-Apr-1956           MRN: [id number]             PCP: Axel Filler, MD Referring: Axel Filler,* Visit Date: 01/19/2023   Subjective:  Pain of the Right Shoulder, Pain of the Left Shoulder, Numbness of the Right Foot, Numbness of the Left Foot, and Follow-up (Patient states her shoulders are starting to hurt again. Patient states her left shoulder is worse. Patient states both feet feel numb.)   History of Present Illness: [name] is a 67 y.o. female here for follow up for seropositive RA on methotrexate 10 mg subcu weekly and folic acid 1 mg daily.  Left shoulder was doing very well after injection from last visit she has noticed somewhat worsening of pain in this area again though.  Her main issue by far is right knee pain and stiffness.  She recently discussed this problem with PCP office with updated x-ray obtained showing severe medial compartment bone-on-bone osteoarthritis with associated varus alignment of the knee.  The other issue limiting her mobility is neuropathy in the legs and feet.   Previous HPI 10/19/22  [name] is a 67 y.o. female here for follow up for seropositive RA. She was prescribed methotrexate 10 mg PO weekly and took this medicine noticing improvement in joint pain symptoms especially arms and hands. However noticed considerable nausea when taking each dose and discontinued the medication after initial 5 week prescription. Currently right shoulder remains doing very well since her injection earlier in the year. Left shoulder is very limited and also right knee is stiff especially after prolonged sitting but not in any exacerbation.    Previous HPI 07/21/22 [name] is a 67 y.o. female here for follow up for follow up for probable seropositive RA.  After our last visit had been prescribed methotrexate to start as initial low-dose treatment option for RA or  even steroid sparing coverage for PMR.  She has been concerned about medication side effects and has taken 0 doses of methotrexate.  However since getting intra-articular steroid injection for the right shoulder is doing dramatically better her left shoulder is actually more painful now at this time.  She also describes some ongoing joint stiffness in her hands but right hand is also more flexible after the shoulder steroid injection.  She sees finger triggering of her second digit most commonly at night or in the early morning.  She is starting to redevelop some right knee effusion after previous aspiration injection a couple months ago.  Previous synovial fluid analyses have been bland with white blood cell count very low or up to low 300s.     Previous HPI 04/21/2022 [name] is a 67 y.o. female here for follow up for chronic joint pains especially bilateral shoulder pain with significant morning stiffness and previous concern for PMR with ultrasound findings consistent with rotator cuff tendonitis.  Since our visit she continues having joint pains in several areas although these are not severely limiting her activity.  She has seen some swelling coming and going in the second MCP joint of her right hand.  She also keeps some amount of pain and swelling in the right knee consistently.  Lab tests at our initial visit showed normal sedimentation rate negative hepatitis screening and normal LFTs. Xrays demonstrating  OA most severe in 1st CMC joints otherwise not so advanced and no erosions.   Previous HPI 03/31/2022 [name] is a 67 y.o. female here for evaluation of chronic joint pains especially bilateral shoulder pain with significant morning stiffness and previous concern for PMR with ultrasound findings consistent with rotator cuff tendonitis. She has longstanding knee and lumbar spine osteoarthritis which limits mobility. Previous knee aspiration with non inflammatory synovial fluid analysis  2 and 3 years ago. She had concern for GCA due to onset of headaches and highly elevated sedimentation rate test last year, but negative TA biopsy was obtained with Dr. Katy Fitch. She did not take recommended prednisone course due to side effects. Shoulder pain and also decreased hip flexion mobility ongoing symptoms despite resolution of headache complaints from last year.  More recent inflammatory markers in February or decreased to normal but had low positive rheumatoid factor.   Labs reviewed 12/2021 RF 24.3 CCP neg ESR 11 CRP 6   06/2021 ESR 100   08/2017 HCV neg   Review of Systems  Constitutional:  Negative for fatigue.  HENT:  Positive for mouth dryness. Negative for mouth sores.   Eyes:  Positive for dryness.  Respiratory:  Negative for shortness of breath.   Cardiovascular:  Negative for chest pain and palpitations.  Gastrointestinal:  Positive for constipation. Negative for blood in stool and diarrhea.  Endocrine: Positive for increased urination.  Genitourinary:  Positive for involuntary urination.  Musculoskeletal:  Positive for joint pain, gait problem, joint pain, myalgias, muscle weakness, morning stiffness, muscle tenderness and myalgias. Negative for joint swelling.  Skin:  Positive for rash. Negative for color change, hair loss and sensitivity to sunlight.  Allergic/Immunologic: Negative for susceptible to infections.  Neurological:  Positive for headaches. Negative for dizziness.  Hematological:  Positive for swollen glands.  Psychiatric/Behavioral:  Negative for depressed mood and sleep disturbance. The patient is not nervous/anxious.     PMFS History:  Patient Active Problem List   Diagnosis Date Noted   Polyarthritis with positive rheumatoid factor ([place]) 03/31/2022   High risk medication use 03/31/2022   Tendinitis of both rotator cuffs 09/07/2021   Keloid scar of skin 09/07/2021   Pes planus of left foot 09/08/2020   Hallux valgus 09/08/2020   Glucose  intolerance (impaired glucose tolerance) 07/14/2020   Vitamin D deficiency 07/14/2020   Depression 10/16/2018   Osteoarthritis of left shoulder 10/24/2017   Peripheral neuropathy 08/19/2017   Atherosclerosis of aorta ([place]) 06/20/2015   Osteoarthritis of right knee 11/07/2014   Health care maintenance 10/09/2014   Coronary atherosclerosis of native coronary artery 01/05/2014   Morbid obesity due to excess calories ([place]) 05/10/2012   Obstructive sleep apnea 03/27/2010   GERD 08/06/2008   Hyperlipidemia 10/31/2007   Essential hypertension 06/30/2007    Past Medical History:  Diagnosis Date   Acute urinary retention s/p Foley 01/07/2012   Allergy    Anemia    Asthma    CAD (coronary artery disease)    a. 12/2013 s/p CABG x 5 Dr. Darcey Nora (LIMA to LAD, SVG to diagonal, SVG to OM1, SVG to OM2, SVG to PDA)   Carotid arterial disease ([place])    a. 12/2013 Carotid U/S: 1-39% bilat ICA stenosis.   Carpal tunnel syndrome, bilateral    GERD (gastroesophageal reflux disease)    Hemorrhoids, internal, with bleeding & prolapse 12/13/2011   Hernia, abdominal    History of blood transfusion    CABG and hysterectomy   History of kidney  stones    Hyperlipidemia    Hypertension    Iron deficiency 12/07/2011   Keloid scar    a. Sternal Keloid s/p CABG with ongoing pain.   Morbid obesity ([place])    Myocardial infarct ([place])    2015   Neuropathy    Osteoarthritis    Osteoporosis    PONV (postoperative nausea and vomiting)    Seasonal allergies    Sleep apnea    no CPAP machine; sleep study 02/2010 REM AHI 61.7/hr, total sleep REM 14.8/hr   Tooth abscess    Uterine cancer ([place]) 05/2014   clinical stage IA grade 1 endometrioid endometrial cancer    Family History  Problem Relation Age of Onset   Heart attack Mother 68   Heart disease Mother    Hypertension Mother    Diabetes Mother    Hypertension Sister    Heart failure Sister    Heart disease Sister    Diabetes Sister    Kidney disease  Sister    Kidney disease Brother    Hypertension Daughter    Bipolar disorder Son    Schizophrenia Son    Anxiety disorder Son    Esophageal cancer Other        nephew   Colon cancer Neg Hx    Pancreatic cancer Neg Hx    Stomach cancer Neg Hx    Liver disease Neg Hx    Rectal cancer Neg Hx    Past Surgical History:  Procedure Laterality Date   ARTERY BIOPSY Left 07/10/2021   Procedure: LEFT TEMPORAL ARTERY BIOPSY;  Surgeon: Serafina Mitchell, MD;  Location: [facility];  Service: Vascular;  Laterality: Left;   bladder tack     BREAST REDUCTION SURGERY Bilateral 10/10/2015   Procedure: BILATERAL MAMMARY REDUCTION  (BREAST) WITH FREE NIPPLE GRAFT;  Surgeon: Irene Limbo, MD;  Location: [place];  Service: Plastics;  Laterality: Bilateral;   CARDIAC CATHETERIZATION     CATARACT EXTRACTION Right    COLONOSCOPY  2009   COLONOSCOPY W/ BIOPSIES AND POLYPECTOMY     CORONARY ARTERY BYPASS GRAFT N/A 01/09/2014   Procedure: CORONARY ARTERY BYPASS GRAFTING (CABG) x 5 using left internal mammary artery and right leg greater saphenous vein harvested endoscopically;  Surgeon: Ivin Poot, MD;  Location: [place];  Service: Open Heart Surgery;  Laterality: N/A;  please use bed extenders and breast binder   INTRAOPERATIVE TRANSESOPHAGEAL ECHOCARDIOGRAM N/A 01/09/2014   Procedure: INTRAOPERATIVE TRANSESOPHAGEAL ECHOCARDIOGRAM;  Surgeon: Ivin Poot, MD;  Location: [place];  Service: Open Heart Surgery;  Laterality: N/A;   KNEE ARTHROSCOPY  1991   KNEE SURGERY Bilateral 1999   LEFT HEART CATHETERIZATION WITH CORONARY ANGIOGRAM N/A 01/04/2014   Procedure: LEFT HEART CATHETERIZATION WITH CORONARY ANGIOGRAM;  Surgeon: Sinclair Grooms, MD;  Location: [facility] C[facility];  Service: Cardiovascular;  Laterality: N/A;   LESION EXCISION WITH COMPLEX REPAIR N/A 05/07/2016   Procedure: COMPLEX REPAIR OF CHEST 16 CM;  Surgeon: Irene Limbo, MD;  Location: [place];  Service: Plastics;  Laterality: N/A;   MULTIPLE  EXTRACTIONS WITH ALVEOLOPLASTY N/A 04/11/2014   Procedure: Extraction of tooth #'s 1,2,8,16 with alveoloplasty, maxillary tuberosity reductions, and gross debridement of remaining teeth.;  Surgeon: Lenn Cal, DDS;  Location: [place];  Service: Oral Surgery;  Laterality: N/A;   NM MYOCAR PERF WALL MOTION  01/2010   dipyridamole myoview - moderate perfusion defect in basal inferoseptal, basal inferior, mid inferoseptal, mid inferior, apical inferior region; EF 56%  TEE WITHOUT CARDIOVERSION  01/2010   EF 60-65%, small, flat, non-infiltrating, calcified, fixed apical/septal mass   TOTAL KNEE ARTHROPLASTY Left 04/29/2011   transanal hemorrhoidal dearterliaization  01/06/2012   with external hemorrhoid removal   UNILATERAL SALPINGECTOMY Right 06/03/2014   Procedure: UNILATERAL SALPINGECTOMY;  Surgeon: Lavonia Drafts, MD;  Location: [facility];  Service: Gynecology;  Laterality: Right;   VAGINAL HYSTERECTOMY N/A 06/03/2014   Procedure: HYSTERECTOMY VAGINAL;  Surgeon: Lavonia Drafts, MD;  Location: [facility];  Service: Gynecology;  Laterality: N/A;   WISDOM TOOTH EXTRACTION     Social History   Social History Narrative   Current Social History 08/27/2020        Patient lives alone in a handicap-accessible ground floor apartment which is 1 story. There are not steps up to the entrance the patient uses.       Patient's method of transportation is personal car or family member.      The highest level of education was 2 years of college.      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 [employer]      Right Handed      Identified important Relationships are My grandchildren, daughter, son       Pets : None       Interests / Fun: Plants       Current Stressors: Son with special needs (Bi-polar/schizophrenia) , he is not living with her        Religious / Personal Beliefs: You Tube mentors (Vegan videos)      [place], RN,BSN           Immunization History  Administered Date(s) Administered   Influenza Split 01/07/2012   Influenza Whole 10/31/2007, 09/18/2009, 09/25/2010   Influenza, Seasonal, Injecte, Preservative Fre 11/28/2012   Influenza,inj,Quad PF,6+ Mos 08/07/2013, 01/02/2014, 08/01/2015, 09/20/2016, 09/05/2017, 10/16/2018   Influenza-Unspecified 09/07/2021   PFIZER(Purple Top)SARS-COV-2 Vaccination 04/10/2020, 05/05/2020   Pneumococcal Conjugate-13 07/14/2020   Pneumococcal Polysaccharide-23 01/07/2012, 10/24/2017   Td 10/15/2009   Tdap 02/23/2021     Objective: Vital Signs: BP 132/65 (BP Location: Right Arm, Patient Position: Sitting, Cuff Size: Large)   Pulse (!) 54   Resp 12   Ht 5' 8"$  (1.727 m)   Wt 270 lb (122.5 kg)   LMP 01/13/2014 Comment: spotting in Feb and bleeding since April  BMI 41.05 kg/m    Physical Exam Constitutional:      Appearance: She is obese.  Eyes:     Conjunctiva/sclera: Conjunctivae normal.  Cardiovascular:     Rate and Rhythm: Normal rate and regular rhythm.  Pulmonary:     Effort: Pulmonary effort is normal.     Breath sounds: Normal breath sounds.  Musculoskeletal:     Right lower leg: No edema.     Left lower leg: No edema.  Skin:    Findings: No rash.  Neurological:     Mental Status: She is alert.  Psychiatric:        Mood and Affect: Mood normal.      Musculoskeletal Exam:  Right shoulder full ROM, left shoulder limited in all 3 planes, pain worse anterior and lateral sides to pressure, no palpable swelling Elbows full ROM no tenderness or swelling Wrists full ROM no tenderness or swelling Fingers full ROM no tenderness or swelling, right 2nd finger triggering without pain and ROM intact Knees with patellofemoral crepitus more prominent on right, mild joint line tenderness to pressure, no palpable effusion Ankles full ROM no tenderness or swelling  CDAI Exam: CDAI Score: 8  Patient Global: 30 mm; Provider Global: 20 mm Swollen: 0 ; Tender: 3   Joint Exam 01/19/2023      Right  Left  Glenohumeral      Tender  Knee   Tender   Tender     Investigation: No additional findings.  Imaging: DG Knee Complete 4 Views Right  Result Date: 01/12/2023 CLINICAL DATA:  Osteoarthritis. EXAM: RIGHT KNEE - COMPLETE 4+ VIEW COMPARISON:  03/21/2015 FINDINGS: No fracture or dislocation. Progressive now severe degenerative change of the medial compartment with complete joint space loss, bone-on-bone articulation, subchondral sclerosis, articular surface irregularity and osteophytosis with associated varus alignment of the knee. Mild degenerative change is seen within the patellofemoral joint and lateral compartment. No evidence of chondrocalcinosis. Adjacent vascular calcifications and surgical clips. Regional soft tissues appear otherwise normal. IMPRESSION: Severe degenerative change of the medial compartment with bone-on-bone articulation and varus alignment of the knee, progressed compared to the 2016 examination. Electronically Signed   By: Sandi Mariscal M.D.   On: 01/12/2023 08:41    Recent Labs: Lab Results  Component Value Date   WBC 7.0 11/02/2022   HGB 13.2 11/02/2022   PLT 188 11/02/2022   NA 137 11/02/2022   K 3.8 11/02/2022   CL 105 11/02/2022   CO2 23 11/02/2022   GLUCOSE 110 (H) 11/02/2022   BUN 16 11/02/2022   CREATININE 0.77 11/02/2022   BILITOT 0.2 (L) 11/02/2022   ALKPHOS 43 11/02/2022   AST 15 11/02/2022   ALT 10 11/02/2022   PROT 6.9 11/02/2022   ALBUMIN 3.4 (L) 11/02/2022   CALCIUM 8.9 11/02/2022   GFRAA 99 07/14/2020    Speciality Comments: No specialty comments available.  Procedures:  No procedures performed Allergies: Cephalosporins, Eicosapentaenoic acid (epa), Fish-derived products, Peanuts [peanut oil], Shrimp [shellfish allergy], Latex, Metronidazole, Methotrexate derivatives, Ciprofloxacin, Lisinopril, Naproxen, Other, and Prednisone   Assessment / Plan:     Visit Diagnoses: Polyarthritis with positive  rheumatoid factor ([place]) - Plan: Sedimentation rate  I believe her RA is still fairly controlled more of the current symptoms seem related to areas with more severe underlying osteoarthritis.  Will check sed rate for disease activity monitoring.  Could certainly consider methotrexate dose titration if abnormal.  Otherwise plan to continue methotrexate 10 mg subcu weekly and folic acid 1 mg daily.  High risk medication use - Plan: CBC with Differential/Platelet, COMPLETE METABOLIC PANEL WITH GFR  Checking CBC and CMP for medication monitoring on long-term use of methotrexate.  No serious interval infections.  Osteoarthritis of right knee, unspecified osteoarthritis type  Agree with recommendation by Dr. Evette Doffing that she would really benefit to see orthopedic surgery for more definitive management plan.  Primary osteoarthritis of left shoulder  Symptoms quite a bit improved after local steroid injection last visit but mobility is back to being more limited.  Recommend against repeat injection at a short interval and will monitor for now.   Orders: Orders Placed This Encounter  Procedures   CBC with Differential/Platelet   COMPLETE METABOLIC PANEL WITH GFR   Sedimentation rate   No orders of the defined types were placed in this encounter.    Follow-Up Instructions: Return in about 3 months (around 04/19/2023) for RA on MTX f/u 56mo.   CCollier Salina MD  Note - This record has been created using [employer]  Chart creation errors have been sought, but may not always  have been located. Such creation errors do not reflect on  the standard of medical care.

## 2023-01-19 NOTE — Telephone Encounter (Signed)
  Follow up Call-     01/18/2023    2:48 PM 09/15/2020    8:48 AM  Call back number  Post procedure Call Back phone  # [phone]  Permission to leave phone message Yes Yes     Patient questions:  Do you have a fever, pain , or abdominal swelling? No. Pain Score  0 *  Have you tolerated food without any problems? Yes.    Have you been able to return to your normal activities? Yes.    Do you have any questions about your discharge instructions: Diet   No. Medications  No. Follow up visit  No.  Do you have questions or concerns about your Care? No.  Actions: * If pain score is 4 or above: No action needed, pain <4.

## 2023-01-20 LAB — COMPLETE METABOLIC PANEL WITH GFR
AG Ratio: 1.5 (calc) (ref 1.0–2.5)
ALT: 15 U/L (ref 6–29)
AST: 17 U/L (ref 10–35)
Albumin: 4.4 g/dL (ref 3.6–5.1)
Alkaline phosphatase (APISO): 42 U/L (ref 37–153)
BUN: 12 mg/dL (ref 7–25)
CO2: 31 mmol/L (ref 20–32)
Calcium: 9.8 mg/dL (ref 8.6–10.4)
Chloride: 104 mmol/L (ref 98–110)
Creat: 0.76 mg/dL (ref 0.50–1.05)
Globulin: 2.9 g/dL (calc) (ref 1.9–3.7)
Glucose, Bld: 112 mg/dL — ABNORMAL HIGH (ref 65–99)
Potassium: 3.9 mmol/L (ref 3.5–5.3)
Sodium: 140 mmol/L (ref 135–146)
Total Bilirubin: 0.5 mg/dL (ref 0.2–1.2)
Total Protein: 7.3 g/dL (ref 6.1–8.1)
eGFR: 85 mL/min/{1.73_m2} (ref >=60)

## 2023-01-20 LAB — CBC WITH DIFFERENTIAL/PLATELET
Absolute Monocytes: 482 cells/uL (ref 200–950)
Basophils Absolute: 31 cells/uL (ref 0–200)
Basophils Relative: 0.5 %
Eosinophils Absolute: 201 cells/uL (ref 15–500)
Eosinophils Relative: 3.3 %
HCT: 43.1 % (ref 35.0–45.0)
Hemoglobin: 13.8 g/dL (ref 11.7–15.5)
Lymphs Abs: 2251 cells/uL (ref 850–3900)
MCH: 27.9 pg (ref 27.0–33.0)
MCHC: 32 g/dL (ref 32.0–36.0)
MCV: 87.2 fL (ref 80.0–100.0)
MPV: 10.2 fL (ref 7.5–12.5)
Monocytes Relative: 7.9 %
Neutro Abs: 3135 cells/uL (ref 1500–7800)
Neutrophils Relative %: 51.4 %
Platelets: 240 10*3/uL (ref 140–400)
RBC: 4.94 10*6/uL (ref 3.80–5.10)
RDW: 14.7 % (ref 11.0–15.0)
Total Lymphocyte: 36.9 %
WBC: 6.1 10*3/uL (ref 3.8–10.8)

## 2023-01-20 LAB — SEDIMENTATION RATE: Sed Rate: 31 mm/h — ABNORMAL HIGH (ref 0–30)

## 2023-01-20 NOTE — Progress Notes (Signed)
Blood count and liver and kidney function are normal no problems for continuing methotrexate. Her sedimentation rate remains mildly elevated consistent with some inflammation although no more than last time before. I think she is okay to continue the current methotrexate dose.

## 2023-01-30 ENCOUNTER — Encounter: Payer: Self-pay | Admitting: Gastroenterology

## 2023-02-01 ENCOUNTER — Other Ambulatory Visit: Payer: Self-pay | Admitting: Internal Medicine

## 2023-02-01 DIAGNOSIS — M0580 Other rheumatoid arthritis with rheumatoid factor of unspecified site: Secondary | ICD-10-CM

## 2023-03-10 ENCOUNTER — Other Ambulatory Visit: Payer: Self-pay | Admitting: Student in an Organized Health Care Education/Training Program

## 2023-03-21 DIAGNOSIS — M1711 Unilateral primary osteoarthritis, right knee: Secondary | ICD-10-CM | POA: Diagnosis not present

## 2023-03-21 NOTE — Progress Notes (Unsigned)
[facility]   Subjective:     Chief Complaint: No chief complaint on file.  History of Present Illness: [name] is a 67 y.o. female with {PFD symptoms:24771} who presents for a pessary check. She is using a size *** {pessary type:24772} pessary. The pessary has been working well and she has no complaints. She {ACTION; IS/IS ZOX:09604540} using vaginal estrogen. She denies vaginal bleeding.  Past Medical History: Patient  has a past medical history of Acute urinary retention s/p Foley (01/07/2012), Allergy, Anemia, Asthma, CAD (coronary artery disease), Carotid arterial disease (HCC), Carpal tunnel syndrome, bilateral, GERD (gastroesophageal reflux disease), Hemorrhoids, internal, with bleeding & prolapse (12/13/2011), Hernia, abdominal, History of blood transfusion, History of kidney stones, Hyperlipidemia, Hypertension, Iron deficiency (12/07/2011), Keloid scar, Morbid obesity (HCC), Myocardial infarct (H[facility]), Neuropathy, Osteoarthritis, Osteoporosis, PONV (postoperative nausea and vomiting), Seasonal allergies, Sleep apnea, Tooth abscess, and Uterine cancer (HCC) (05/2014).   Past Surgical History: She  has a past surgical history that includes Knee surgery (Bilateral, 1999); Total knee arthroplasty (Left, 04/29/2011); Wisdom tooth extraction; Colonoscopy (2009); Knee arthroscopy (1991); transanal hemorrhoidal dearterliaization (01/06/2012); Coronary artery bypass graft (N/A, 01/09/2014); Intraoprative transesophageal echocardiogram (N/A, 01/09/2014); TEE without cardioversion (01/2010); NM MYOCAR PERF WALL MOTION (01/2010); Cardiac catheterization; Multiple extractions with alveoloplasty (N/A, 04/11/2014); Vaginal hysterectomy (N/A, 06/03/2014); Unilateral salpingectomy (Right, 06/03/2014); left heart catheterization with coronary angiogram (N/A, 01/04/2014); Breast reduction surgery (Bilateral, 10/10/2015); Colonoscopy w/ biopsies and polypectomy; Lesion excision with complex  repair (N/A, 05/07/2016); Cataract extraction (Right); Artery Biopsy (Left, 07/10/2021); and bladder tack.   Medications: She has a current medication list which includes the following prescription(s): albuterol, alum & mag hydroxide-simeth, amlodipine, aspirin ec, vitamin d high potency, folic acid, losartan, methotrexate, metoprolol tartrate, multivitamin with minerals, nystatin cream, omeprazole, polyethyl glycol-propyl glycol, rosuvastatin, senna-docusate, and tuberculin syr 1cc/26gx3/8", and the following Facility-Administered Medications: sodium chloride.   Allergies: Patient is allergic to cephalosporins, eicosapentaenoic acid (epa), fish-derived products, peanuts [peanut oil], shrimp [shellfish allergy], latex, metronidazole, methotrexate derivatives, ciprofloxacin, lisinopril, naproxen, other, and prednisone.   Social History: Patient  reports that she has never smoked. She has never been exposed to tobacco smoke. She has never used smokeless tobacco. She reports that she does not drink alcohol and does not use drugs.      Objective:    Physical Exam: LMP 01/13/2014 Comment: spotting in Feb and bleeding since April Gen: No apparent distress, A&O x 3. Detailed Urogynecologic Evaluation:  Pelvic Exam: Normal external female genitalia; Bartholin's and Skene's glands normal in appearance; urethral meatus {urethra:24773}, no urethral masses or discharge. The pessary was noted to be {in place:24774}. It was removed and cleaned. Speculum exam revealed {vaginal lesions:24775} in the vagina. The pessary was replaced. It was comfortable to the patient and fit well.       No data to display          Laboratory Results: Urine dipstick shows: {ua dip:315374::"negative for all components"}.    Assessment/Plan:    Assessment: [name] is a 67 y.o. with {PFD symptoms:24771} here for a pessary check. She is doing well.  Plan: She will {pessary plan:24776}. She will continue to use  {lubricant:24777}. She will follow-up in *** {days/wks/mos/yrs:310907} for a pessary check or sooner as needed.  All questions were answered.   Time Spent:

## 2023-03-22 ENCOUNTER — Ambulatory Visit (INDEPENDENT_AMBULATORY_CARE_PROVIDER_SITE_OTHER): Payer: 59 | Admitting: Obstetrics and Gynecology

## 2023-03-22 ENCOUNTER — Encounter: Payer: Self-pay | Admitting: Obstetrics and Gynecology

## 2023-03-22 VITALS — BP 171/76 | HR 74

## 2023-03-22 DIAGNOSIS — N993 Prolapse of vaginal vault after hysterectomy: Secondary | ICD-10-CM

## 2023-03-22 DIAGNOSIS — N8110 Cystocele, unspecified: Secondary | ICD-10-CM

## 2023-03-22 DIAGNOSIS — Z4689 Encounter for fitting and adjustment of other specified devices: Secondary | ICD-10-CM

## 2023-03-22 NOTE — Patient Instructions (Addendum)
It was lovely to see your smiling face today!     Use the lubrication if you need something for dryness.   If you want to explore more options and area interested in dating let us know.   Call if you have any issues.

## 2023-04-05 ENCOUNTER — Telehealth: Payer: Self-pay | Admitting: Pharmacist

## 2023-04-05 NOTE — Progress Notes (Signed)
[facility])     [facility] Quality Pharmacy Team Statin Quality Measure Assessment  04/05/2023  [name] 07-30-1956 [id number]  Per review of chart and payor information, patient has a diagnosis of cardiovascular disease but is not currently filling a statin prescription. This places patient into the [facility] (Statin Use In Patients with Cardiovascular Disease) measure for CMS.    Called patient due to Rosuvastatin not being filled since 11/23.  Patient reported that she stopped taking rosuvastatin because it caused her to experience muscle aches.  She was encouraged to speak with her provider about her side effects at her upcoming visit on 04/11/23.    If deemed therapeutically appropriate, patient could be trialed on another statin, increase her dosing interval, or associate a statin exclusion code with the upcoming visit.      Component Value Date/Time   CHOL 147 05/03/2022 1124   TRIG 89 05/03/2022 1124   HDL 53 05/03/2022 1124   CHOLHDL 2.8 05/03/2022 1124   CHOLHDL 3.9 10/15/2013 1508   VLDL 22 10/15/2013 1508   LDLCALC 77 05/03/2022 1124    Please consider ONE of the following recommendations:  Initiate high intensity statin Atorvastatin 40mg  once daily, #90, 3 refills   Rosuvastatin 20mg  once daily, #90, 3 refills    Initiate moderate intensity  statin with reduced frequency if prior  statin intolerance 1x weekly, #13, 3 refills   2x weekly, #26, 3 refills   3x weekly, #39, 3 refills    Code for past statin intolerance  (required annually)  Provider Requirements: Must associate code during an office visit or telehealth encounter   Drug Induced Myopathy G72.0   Myalgia (SPC ONLY) M79.1   Myositis, unspecified M60.9   Myopathy, unspecified G72.9   Rhabdomyolysis M62.82    Plan: Route note to provider prior to the upcoming appointment.  Beecher Mcardle, PharmD, BCACP [facility] Clinical Pharmacist [phone]

## 2023-04-11 ENCOUNTER — Encounter: Payer: Self-pay | Admitting: Student in an Organized Health Care Education/Training Program

## 2023-04-11 ENCOUNTER — Ambulatory Visit (INDEPENDENT_AMBULATORY_CARE_PROVIDER_SITE_OTHER): Payer: 59 | Admitting: Student in an Organized Health Care Education/Training Program

## 2023-04-11 DIAGNOSIS — I10 Essential (primary) hypertension: Secondary | ICD-10-CM

## 2023-04-11 DIAGNOSIS — M0580 Other rheumatoid arthritis with rheumatoid factor of unspecified site: Secondary | ICD-10-CM

## 2023-04-11 DIAGNOSIS — M1711 Unilateral primary osteoarthritis, right knee: Secondary | ICD-10-CM

## 2023-04-11 DIAGNOSIS — K219 Gastro-esophageal reflux disease without esophagitis: Secondary | ICD-10-CM | POA: Diagnosis not present

## 2023-04-11 NOTE — Progress Notes (Signed)
[facility] Internal Medicine Residency Telephone Encounter Continuity Care Appointment  HPI:  This telephone encounter was created for [name] on 04/11/2023 for the following purpose/cc hypertension.  67 year old person living with ischemic vascular disease calling today for follow-up of hypertension and some other acute concerns from her last visit in February.  Having car trouble today so we converted this to a telephone visit.  She was able to follow-up with a number of specialists since I last saw her.  She completed endoscopy, had good understanding of the results, and reports good symptom benefit with the use of omeprazole every day.  For her knee she had follow-up with orthopedics, said that they offered her surgery which she is strongly considering.  Did have a knee injection at that visit which helped only for short period of time.  Knee pain is still intermittent, some days it prevents her from completing necessary tasks.  She followed up with her rheumatologist, was adherent with methotrexate injections with good benefit but ran out of this medication a few weeks ago.  Has not had a refill.  Now noticing swelling and increased tenderness in her left foot.  No fevers or chills.  No chest pain or dyspnea on exertion.  Weight is down a little, she reports it at 261 pounds which is about 11 pounds down from our last visit.  She is working hard on her nutrition.    Past Medical History:  Past Medical History:  Diagnosis Date   Acute urinary retention s/p Foley 01/07/2012   Allergy    Anemia    Asthma    CAD (coronary artery disease)    a. 12/2013 s/p CABG x 5 Dr. Maren Beach (LIMA to LAD, SVG to diagonal, SVG to OM1, SVG to OM2, SVG to PDA)   Carotid arterial disease (HCC)    a. 12/2013 Carotid U/S: 1-39% bilat ICA stenosis.   Carpal tunnel syndrome, bilateral    GERD (gastroesophageal reflux disease)    Hemorrhoids, internal, with bleeding & prolapse 12/13/2011   Hernia,  abdominal    History of blood transfusion    CABG and hysterectomy   History of kidney stones    Hyperlipidemia    Hypertension    Iron deficiency 12/07/2011   Keloid scar    a. Sternal Keloid s/p CABG with ongoing pain.   Morbid obesity (HCC)    Myocardial infarct (HCC)    2015   Neuropathy    Osteoarthritis    Osteoporosis    PONV (postoperative nausea and vomiting)    Seasonal allergies    Sleep apnea    no CPAP machine; sleep study 02/2010 REM AHI 61.7/hr, total sleep REM 14.8/hr   Tooth abscess    Uterine cancer (HCC) 05/2014   clinical stage IA grade 1 endometrioid endometrial cancer      Assessment / Plan / Recommendations:  Please see A&P under problem oriented charting for assessment of the patient's acute and chronic medical conditions.  As always, pt is advised that if symptoms worsen or new symptoms arise, they should go to an urgent care facility or to to ER for further evaluation.   Consent and Medical Decision Making:   This is a telephone encounter between [name] and Tyson Alias on 04/11/2023 for hypertension. The visit was conducted with the patient located at home and Tyson Alias at [facility]. The patient's identity was confirmed using their DOB and current address. The patient has consented to being evaluated through  a telephone encounter and understands the associated risks (an examination cannot be done and the patient may need to come in for an appointment) / benefits (allows the patient to remain at home, decreasing exposure to coronavirus). I personally spent 12 minutes on medical discussion.

## 2023-04-11 NOTE — Assessment & Plan Note (Signed)
Right knee osteoarthritis continues to be a significant issue.  She had a visit consultation with orthopedics and tried another steroid injection.  Some benefit for a few days/weeks, but the issue has returned.  She can follow back up with orthopedics in 2 months.  I recommended that she goes forward with knee replacement surgery, I do not think medical therapy is going to be helpful and it seems like the steroid injections are not effective at maintaining her function at this point.  Her chronic conditions are currently stable, she is low risk for this type of procedure.

## 2023-04-11 NOTE — Assessment & Plan Note (Signed)
Endoscopy completed in February with biopsies that were reassuring, had a few gastric polyps removed, no signs of H. pylori.  Did have a hiatal hernia.  Esophageal symptoms likely related to reflux.  She has had good improvement in symptoms with consistent use of omeprazole which she will continue daily.

## 2023-04-11 NOTE — Assessment & Plan Note (Signed)
Patient with rheumatoid arthritis, recent flare at this point with swelling of the left foot and increased pain and tenderness.  She reports running out of injectable methotrexate a few weeks ago.  I would like to avoid using steroids in her case given poor side effects in the past.  I asked her to call Dr. Gregary Cromer office to have that refill approved and restart this medication this week.  She has a follow-up appointment with Dr. Dimple Casey in June 4.  We can see her sooner if the swelling in her foot worsens and consider steroids sooner if needed.

## 2023-04-11 NOTE — Assessment & Plan Note (Signed)
Patient reports blood pressures are well-controlled at home.  She reports a reading of 130/78 this morning.  Plan to continue with losartan 100 mg daily and amlodipine 5 mg daily.  I reviewed blood work from February which was reassuring.

## 2023-04-12 ENCOUNTER — Ambulatory Visit (INDEPENDENT_AMBULATORY_CARE_PROVIDER_SITE_OTHER): Payer: 59 | Admitting: Podiatry

## 2023-04-12 DIAGNOSIS — M21619 Bunion of unspecified foot: Secondary | ICD-10-CM

## 2023-04-12 DIAGNOSIS — G609 Hereditary and idiopathic neuropathy, unspecified: Secondary | ICD-10-CM

## 2023-04-12 DIAGNOSIS — M19079 Primary osteoarthritis, unspecified ankle and foot: Secondary | ICD-10-CM

## 2023-04-12 DIAGNOSIS — M79675 Pain in left toe(s): Secondary | ICD-10-CM

## 2023-04-12 DIAGNOSIS — M79674 Pain in right toe(s): Secondary | ICD-10-CM

## 2023-04-12 DIAGNOSIS — B351 Tinea unguium: Secondary | ICD-10-CM

## 2023-04-12 NOTE — Progress Notes (Signed)
Subjective: Chief Complaint  Patient presents with   Nail Problem    Patient reports painful, long, thick, toenails that are difficult to trim and also reports a bruise on the left 3rd toe.   67 year old female presents for above concerns. Thinks she tried to cut the toenail and she pulled a piece on the left 3rd lateral border. It does not hurt now. No drainage. She applied caster oil which helped.   Previously been diagnosed with neuropathy  She has been asked inserts as well.  Objective: AAO x3, NAD DP/PT pulses palpable bilaterally, CRT less than 3 seconds Sensation decreased with Semmes Weinstein monofilament Nails are hypertrophic, dystrophic, brittle, discolored, elongated 10.  On the lateral aspect left third toenail with some dried blood.  Upon opening there is no drainage or pus and there is no edema, erythema or any signs of infection noted today.  Incurvation of the nail border.  No surrounding redness or drainage. Tenderness nails 1-5 bilaterally. No open lesions or pre-ulcerative lesions are identified today. Bunion present.  Decreased range of motion of first MPJ.  Flatfoot present. No pain with calf compression, swelling, warmth, erythema  Assessment: Symptomatic onychomycosis  Plan: -All treatment options discussed with the patient including all alternatives, risks, complications.  -She will be debrided nails x 10 without any complications or bleeding.  Particular able to debride left third lateral nail border.  Monitor any signs or symptoms of infection. -Given patient's pain, arthritis, flatfoot as well as pain in her feet I did patient benefit from orthotics.  Will submit predetermination. -Patient encouraged to call the office with any questions, concerns, change in symptoms.   Submit pre det for inserts

## 2023-04-17 DIAGNOSIS — K1121 Acute sialoadenitis: Secondary | ICD-10-CM | POA: Diagnosis not present

## 2023-04-17 DIAGNOSIS — R6884 Jaw pain: Secondary | ICD-10-CM | POA: Diagnosis not present

## 2023-04-26 ENCOUNTER — Ambulatory Visit: Payer: 59 | Attending: Internal Medicine | Admitting: Internal Medicine

## 2023-04-26 ENCOUNTER — Encounter: Payer: Self-pay | Admitting: Internal Medicine

## 2023-04-26 VITALS — BP 154/86 | HR 58 | Resp 14 | Ht 68.0 in | Wt 271.0 lb

## 2023-04-26 DIAGNOSIS — M0580 Other rheumatoid arthritis with rheumatoid factor of unspecified site: Secondary | ICD-10-CM | POA: Diagnosis not present

## 2023-04-26 DIAGNOSIS — Z79899 Other long term (current) drug therapy: Secondary | ICD-10-CM | POA: Diagnosis not present

## 2023-04-26 DIAGNOSIS — R053 Chronic cough: Secondary | ICD-10-CM

## 2023-04-26 DIAGNOSIS — K1120 Sialoadenitis, unspecified: Secondary | ICD-10-CM | POA: Diagnosis not present

## 2023-04-26 MED ORDER — HYDROXYCHLOROQUINE SULFATE 200 MG PO TABS
200.0000 mg | ORAL_TABLET | Freq: Every day | ORAL | 2 refills | Status: DC
Start: 2023-04-26 — End: 2023-07-13

## 2023-04-26 NOTE — Patient Instructions (Signed)
Hydroxychloroquine Tablets What is this medication? HYDROXYCHLOROQUINE (hye drox ee KLOR oh kwin) treats autoimmune conditions, such as rheumatoid arthritis and lupus. It works by slowing down an overactive immune system. It may also be used to prevent and treat malaria. It works by killing the parasite that causes malaria. It belongs to a group of medications called DMARDs. This medicine may be used for other purposes; ask your health care provider or pharmacist if you have questions. COMMON BRAND NAME(S): Plaquenil, Quineprox What should I tell my care team before I take this medication? They need to know if you have any of these conditions: Diabetes Eye disease, vision problems Frequently drink alcohol G6PD deficiency Heart disease Irregular heartbeat or rhythm Kidney disease Liver disease Porphyria Psoriasis An unusual or allergic reaction to hydroxychloroquine, other medications, foods, dyes, or preservatives Pregnant or trying to get pregnant Breastfeeding How should I use this medication? Take this medication by mouth with water. Take it as directed on the prescription label. Do not cut, crush, or chew this medication. Swallow the tablets whole. Take it with food. Do not take it more than directed. Take all of this medication unless your care team tells you to stop it early. Keep taking it even if you think you are better. Take products with antacids in them at a different time of day than this medication. Take this medication 4 hours before or 4 hours after antacids. Talk to your care team if you have questions. Talk to your care team about the use of this medication in children. While this medication may be prescribed for selected conditions, precautions do apply. Overdosage: If you think you have taken too much of this medicine contact a poison control center or emergency room at once. NOTE: This medicine is only for you. Do not share this medicine with others. What if I miss a  dose? If you miss a dose, take it as soon as you can. If it is almost time for your next dose, take only that dose. Do not take double or extra doses. What may interact with this medication? Do not take this medication with any of the following: Cisapride Dronedarone Pimozide Thioridazine This medication may also interact with the following: Ampicillin Antacids Cimetidine Cyclosporine Digoxin Kaolin Medications for diabetes, such as insulin, glipizide, glyburide Medications for seizures, such as carbamazepine, phenobarbital, phenytoin Mefloquine Methotrexate Other medications that cause heart rhythm changes Praziquantel This list may not describe all possible interactions. Give your health care provider a list of all the medicines, herbs, non-prescription drugs, or dietary supplements you use. Also tell them if you smoke, drink alcohol, or use illegal drugs. Some items may interact with your medicine. What should I watch for while using this medication? Visit your care team for regular checks on your progress. Tell your care team if your symptoms do not start to get better or if they get worse. You may need blood work done while you are taking this medication. If you take other medications that can affect heart rhythm, you may need more testing. Talk to your care team if you have questions. Your vision may be tested before and during use of this medication. Tell your care team right away if you have any change in your eyesight. This medication may cause serious skin reactions. They can happen weeks to months after starting the medication. Contact your care team right away if you notice fevers or flu-like symptoms with a rash. The rash may be red or purple and then turn   into blisters or peeling of the skin. Or, you might notice a red rash with swelling of the face, lips or lymph nodes in your neck or under your arms. If you or your family notice any changes in your behavior, such as new or  worsening depression, thoughts of harming yourself, anxiety, or other unusual or disturbing thoughts, or memory loss, call your care team right away. What side effects may I notice from receiving this medication? Side effects that you should report to your care team as soon as possible: Allergic reactions--skin rash, itching, hives, swelling of the face, lips, tongue, or throat Aplastic anemia--unusual weakness or fatigue, dizziness, headache, trouble breathing, increased bleeding or bruising Change in vision Heart rhythm changes--fast or irregular heartbeat, dizziness, feeling faint or lightheaded, chest pain, trouble breathing Infection--fever, chills, cough, or sore throat Low blood sugar (hypoglycemia)--tremors or shaking, anxiety, sweating, cold or clammy skin, confusion, dizziness, rapid heartbeat Muscle injury--unusual weakness or fatigue, muscle pain, dark yellow or brown urine, decrease in amount of urine Pain, tingling, or numbness in the hands or feet Rash, fever, and swollen lymph nodes Redness, blistering, peeling, or loosening of the skin, including inside the mouth Thoughts of suicide or self-harm, worsening mood, or feelings of depression Unusual bruising or bleeding Side effects that usually do not require medical attention (report to your care team if they continue or are bothersome): Diarrhea Headache Nausea Stomach pain Vomiting This list may not describe all possible side effects. Call your doctor for medical advice about side effects. You may report side effects to FDA at 1-800-FDA-1088. Where should I keep my medication? Keep out of the reach of children and pets. Store at room temperature up to 30 degrees C (86 degrees F). Protect from light. Get rid of any unused medication after the expiration date. To get rid of medications that are no longer needed or have expired: Take the medication to a medication take-back program. Check with your pharmacy or law enforcement  to find a location. If you cannot return the medication, check the label or package insert to see if the medication should be thrown out in the garbage or flushed down the toilet. If you are not sure, ask your care team. If it is safe to put it in the trash, empty the medication out of the container. Mix the medication with cat litter, dirt, coffee grounds, or other unwanted substance. Seal the mixture in a bag or container. Put it in the trash. NOTE: This sheet is a summary. It may not cover all possible information. If you have questions about this medicine, talk to your doctor, pharmacist, or health care provider.  2024 Elsevier/Gold Standard (2022-05-17 00:00:00)  

## 2023-04-26 NOTE — Progress Notes (Signed)
Office Visit Note  Patient: [name]             Date of Birth: 02-22-1956           MRN: [id number]             PCP: Tyson Alias, MD Referring: Tyson Alias,* Visit Date: 04/26/2023   Subjective:  Follow-up (Patient states she stopped taking the methotrexate injection because around the fourth week it caused her shortness of breath. Patient states she has left shoulder pain. )   History of Present Illness: [name] is a 67 y.o. female here for follow up for seropositive rheumatoid arthritis.  She was previously taking methotrexate 10 mg subcu weekly took this for a month twice but discontinued after completing the prescription refill and is also complaining of experiencing shortness of breath by the fourth week of treatment each time.  Also has some persistent cough she states this is more prominent when she takes her medication specifically blames what she thinks is lisinopril and describes a blue hypertension medication.  Now off any specific RA medicine for the past month does not feel like she has had a severe exacerbation of joint symptoms.  She saw orthopedic surgery clinic about right knee osteoarthritis and was treated with intra-articular steroid injection benefit lasting only a few weeks.  Still with a lot of chronic pain and decreased mobility in the left shoulder.  Recently went on treatment with Augmentin prescribed on May 26 apparently with what sounds like bilateral submandibular gland swelling and was diagnosed as acute sialoadenitis.  Symptoms are improving still with some tenderness in the area.  Has chronic dry mouth did not notice any new lesions or specific inflamed areas in the mouth.  Continues using Systane for chronic dry eyes last visit reviewed by Dr. Dione Booze in January.  Has history of episcleritis but appears well-controlled.   Previous HPI 01/19/23 [name] is a 67 y.o. female here for follow up for seropositive RA on  methotrexate 10 mg subcu weekly and folic acid 1 mg daily.  Left shoulder was doing very well after injection from last visit she has noticed somewhat worsening of pain in this area again though.  Her main issue by far is right knee pain and stiffness.  She recently discussed this problem with PCP office with updated x-ray obtained showing severe medial compartment bone-on-bone osteoarthritis with associated varus alignment of the knee.  The other issue limiting her mobility is neuropathy in the legs and feet.   Previous HPI 10/19/22 [name] is a 67 y.o. female here for follow up for seropositive RA. She was prescribed methotrexate 10 mg PO weekly and took this medicine noticing improvement in joint pain symptoms especially arms and hands. However noticed considerable nausea when taking each dose and discontinued the medication after initial 5 week prescription. Currently right shoulder remains doing very well since her injection earlier in the year. Left shoulder is very limited and also right knee is stiff especially after prolonged sitting but not in any exacerbation.    Previous HPI 07/21/22 [name] is a 67 y.o. female here for follow up for follow up for probable seropositive RA.  After our last visit had been prescribed methotrexate to start as initial low-dose treatment option for RA or even steroid sparing coverage for PMR.  She has been concerned about medication side effects and has taken 0 doses of methotrexate.  However since getting intra-articular steroid  injection for the right shoulder is doing dramatically better her left shoulder is actually more painful now at this time.  She also describes some ongoing joint stiffness in her hands but right hand is also more flexible after the shoulder steroid injection.  She sees finger triggering of her second digit most commonly at night or in the early morning.  She is starting to redevelop some right knee effusion after previous  aspiration injection a couple months ago.  Previous synovial fluid analyses have been bland with white blood cell count very low or up to low 300s.     Previous HPI 04/21/2022 [name] is a 67 y.o. female here for follow up for chronic joint pains especially bilateral shoulder pain with significant morning stiffness and previous concern for PMR with ultrasound findings consistent with rotator cuff tendonitis.  Since our visit she continues having joint pains in several areas although these are not severely limiting her activity.  She has seen some swelling coming and going in the second MCP joint of her right hand.  She also keeps some amount of pain and swelling in the right knee consistently.  Lab tests at our initial visit showed normal sedimentation rate negative hepatitis screening and normal LFTs. Xrays demonstrating OA most severe in 1st CMC joints otherwise not so advanced and no erosions.   Previous HPI 03/31/2022 [name] is a 67 y.o. female here for evaluation of chronic joint pains especially bilateral shoulder pain with significant morning stiffness and previous concern for PMR with ultrasound findings consistent with rotator cuff tendonitis. She has longstanding knee and lumbar spine osteoarthritis which limits mobility. Previous knee aspiration with non inflammatory synovial fluid analysis 2 and 3 years ago. She had concern for GCA due to onset of headaches and highly elevated sedimentation rate test last year, but negative TA biopsy was obtained with Dr. Dione Booze. She did not take recommended prednisone course due to side effects. Shoulder pain and also decreased hip flexion mobility ongoing symptoms despite resolution of headache complaints from last year.  More recent inflammatory markers in February or decreased to normal but had low positive rheumatoid factor.   Labs reviewed 12/2021 RF 24.3 CCP neg ESR 11 CRP 6   06/2021 ESR 100   08/2017 HCV neg   Review of Systems   Constitutional:  Negative for fatigue.  HENT:  Positive for mouth dryness. Negative for mouth sores.   Eyes:  Negative for dryness.  Respiratory:  Negative for shortness of breath.   Cardiovascular:  Positive for chest pain. Negative for palpitations.  Gastrointestinal:  Negative for blood in stool, constipation and diarrhea.  Endocrine: Negative for increased urination.  Genitourinary:  Negative for involuntary urination.  Musculoskeletal:  Positive for joint pain, gait problem, joint pain, muscle weakness, morning stiffness and muscle tenderness. Negative for joint swelling, myalgias and myalgias.  Skin:  Positive for hair loss. Negative for color change, rash and sensitivity to sunlight.  Allergic/Immunologic: Negative for susceptible to infections.  Neurological:  Negative for dizziness and headaches.  Hematological:  Positive for swollen glands.  Psychiatric/Behavioral:  Negative for depressed mood and sleep disturbance. The patient is not nervous/anxious.     PMFS History:  Patient Active Problem List   Diagnosis Date Noted   Polyarthritis with positive rheumatoid factor (HCC) 03/31/2022   High risk medication use 03/31/2022   Tendinitis of both rotator cuffs 09/07/2021   Keloid scar of skin 09/07/2021   Pes planus of left foot 09/08/2020  Hallux valgus 09/08/2020   Glucose intolerance (impaired glucose tolerance) 07/14/2020   Vitamin D deficiency 07/14/2020   Cough 03/15/2019   Depression 10/16/2018   Osteoarthritis of left shoulder 10/24/2017   Peripheral neuropathy 08/19/2017   Atherosclerosis of aorta (HCC) 06/20/2015   Osteoarthritis of right knee 11/07/2014   Health care maintenance 10/09/2014   Coronary atherosclerosis of native coronary artery 01/05/2014   Morbid obesity due to excess calories (HCC) 05/10/2012   Obstructive sleep apnea 03/27/2010   GERD 08/06/2008   Hyperlipidemia 10/31/2007   Essential hypertension 06/30/2007    Past Medical History:   Diagnosis Date   Acute urinary retention s/p Foley 01/07/2012   Allergy    Anemia    Asthma    CAD (coronary artery disease)    a. 12/2013 s/p CABG x 5 Dr. Maren Beach (LIMA to LAD, SVG to diagonal, SVG to OM1, SVG to OM2, SVG to PDA)   Carotid arterial disease (HCC)    a. 12/2013 Carotid U/S: 1-39% bilat ICA stenosis.   Carpal tunnel syndrome, bilateral    GERD (gastroesophageal reflux disease)    Hemorrhoids, internal, with bleeding & prolapse 12/13/2011   Hernia, abdominal    History of blood transfusion    CABG and hysterectomy   History of kidney stones    Hyperlipidemia    Hypertension    Iron deficiency 12/07/2011   Keloid scar    a. Sternal Keloid s/p CABG with ongoing pain.   Morbid obesity (HCC)    Myocardial infarct (HCC)    2015   Neuropathy    Osteoarthritis    Osteoporosis    PONV (postoperative nausea and vomiting)    Seasonal allergies    Sleep apnea    no CPAP machine; sleep study 02/2010 REM AHI 61.7/hr, total sleep REM 14.8/hr   Tooth abscess    Uterine cancer (HCC) 05/2014   clinical stage IA grade 1 endometrioid endometrial cancer    Family History  Problem Relation Age of Onset   Heart attack Mother 46   Heart disease Mother    Hypertension Mother    Diabetes Mother    Hypertension Sister    Heart failure Sister    Heart disease Sister    Diabetes Sister    Kidney disease Sister    Kidney disease Brother    Hypertension Daughter    Bipolar disorder Son    Schizophrenia Son    Anxiety disorder Son    Esophageal cancer Other        nephew   Colon cancer Neg Hx    Pancreatic cancer Neg Hx    Stomach cancer Neg Hx    Liver disease Neg Hx    Rectal cancer Neg Hx    Past Surgical History:  Procedure Laterality Date   ARTERY BIOPSY Left 07/10/2021   Procedure: LEFT TEMPORAL ARTERY BIOPSY;  Surgeon: Nada Libman, MD;  Location: [facility] OR;  Service: Vascular;  Laterality: Left;   bladder tack     BREAST REDUCTION SURGERY Bilateral 10/10/2015    Procedure: BILATERAL MAMMARY REDUCTION  (BREAST) WITH FREE NIPPLE GRAFT;  Surgeon: Glenna Fellows, MD;  Location: [facility] OR;  Service: Plastics;  Laterality: Bilateral;   CARDIAC CATHETERIZATION     CATARACT EXTRACTION Right    COLONOSCOPY  2009   COLONOSCOPY W/ BIOPSIES AND POLYPECTOMY     CORONARY ARTERY BYPASS GRAFT N/A 01/09/2014   Procedure: CORONARY ARTERY BYPASS GRAFTING (CABG) x 5 using left internal mammary artery and right leg greater saphenous  vein harvested endoscopically;  Surgeon: Kerin Perna, MD;  Location: [facility] OR;  Service: Open Heart Surgery;  Laterality: N/A;  please use bed extenders and breast binder   INTRAOPERATIVE TRANSESOPHAGEAL ECHOCARDIOGRAM N/A 01/09/2014   Procedure: INTRAOPERATIVE TRANSESOPHAGEAL ECHOCARDIOGRAM;  Surgeon: Kerin Perna, MD;  Location: [facility] OR;  Service: Open Heart Surgery;  Laterality: N/A;   KNEE ARTHROSCOPY  1991   KNEE SURGERY Bilateral 1999   LEFT HEART CATHETERIZATION WITH CORONARY ANGIOGRAM N/A 01/04/2014   Procedure: LEFT HEART CATHETERIZATION WITH CORONARY ANGIOGRAM;  Surgeon: Lesleigh Noe, MD;  Location: [facility] CATH LAB;  Service: Cardiovascular;  Laterality: N/A;   LESION EXCISION WITH COMPLEX REPAIR N/A 05/07/2016   Procedure: COMPLEX REPAIR OF CHEST 16 CM;  Surgeon: Glenna Fellows, MD;  Location: [facility] OR;  Service: Plastics;  Laterality: N/A;   MULTIPLE EXTRACTIONS WITH ALVEOLOPLASTY N/A 04/11/2014   Procedure: Extraction of tooth #'s 1,2,8,16 with alveoloplasty, maxillary tuberosity reductions, and gross debridement of remaining teeth.;  Surgeon: Charlynne Pander, DDS;  Location: [facility] OR;  Service: Oral Surgery;  Laterality: N/A;   NM MYOCAR PERF WALL MOTION  01/2010   dipyridamole myoview - moderate perfusion defect in basal inferoseptal, basal inferior, mid inferoseptal, mid inferior, apical inferior region; EF 56%   TEE WITHOUT CARDIOVERSION  01/2010   EF 60-65%, small, flat, non-infiltrating, calcified, fixed apical/septal mass    TOTAL KNEE ARTHROPLASTY Left 04/29/2011   transanal hemorrhoidal dearterliaization  01/06/2012   with external hemorrhoid removal   UNILATERAL SALPINGECTOMY Right 06/03/2014   Procedure: UNILATERAL SALPINGECTOMY;  Surgeon: Willodean Rosenthal, MD;  Location: [facility] ORS;  Service: Gynecology;  Laterality: Right;   VAGINAL HYSTERECTOMY N/A 06/03/2014   Procedure: HYSTERECTOMY VAGINAL;  Surgeon: Willodean Rosenthal, MD;  Location: [facility] ORS;  Service: Gynecology;  Laterality: N/A;   WISDOM TOOTH EXTRACTION     Social History   Social History Narrative   Current Social History 08/27/2020        Patient lives alone in a handicap-accessible ground floor apartment which is 1 story. There are not steps up to the entrance the patient uses.       Patient's method of transportation is personal car or family member.      The highest level of education was 2 years of college.      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 [employer]      Right Handed      Identified important Relationships are My grandchildren, daughter, son       Pets : None       Interests / Fun: Plants       Current Stressors: Son with special needs (Bi-polar/schizophrenia) , he is not living with her        Religious / Personal Beliefs: You T[employer] mentors (Vegan videos)      SChaplin, RN,BSN          Immunization History  Administered Date(s) Administered   Influenza Split 01/07/2012   Influenza Whole 10/31/2007, 09/18/2009, 09/25/2010   Influenza, Seasonal, Injecte, Preservative Fre 11/28/2012   Influenza,inj,Quad PF,6+ Mos 08/07/2013, 01/02/2014, 08/01/2015, 09/20/2016, 09/05/2017, 10/16/2018   Influenza-Unspecified 09/07/2021   PFIZER(Purple Top)SARS-COV-2 Vaccination 04/10/2020, 05/05/2020   Pneumococcal Conjugate-13 07/14/2020   Pneumococcal Polysaccharide-23 01/07/2012, 10/24/2017   Td 10/15/2009   Tdap 02/23/2021     Objective: Vital Signs: BP (!) 154/86 (BP Location:  Right Arm, Patient Position: Sitting, Cuff Size: Normal)   Pulse (!) 58   Resp 14   Ht 5'  8" (1.727 m)   Wt 271 lb (122.9 kg)   LMP 01/13/2014 Comment: spotting in Feb and bleeding since April  BMI 41.21 kg/m    Physical Exam Constitutional:      Appearance: She is obese.  Eyes:     Conjunctiva/sclera: Conjunctivae normal.  Cardiovascular:     Rate and Rhythm: Normal rate and regular rhythm.  Pulmonary:     Effort: Pulmonary effort is normal.     Breath sounds: Normal breath sounds.  Skin:    General: Skin is warm and dry.  Neurological:     Mental Status: She is alert.  Psychiatric:        Mood and Affect: Mood normal.      Musculoskeletal Exam:  Left shoulder limited range of motion in all 3 planes, not particularly tender and no swelling to palpation, right shoulder full range of motion Elbows full ROM no tenderness or swelling Wrists full ROM no tenderness or swelling Fingers full ROM no tenderness or swelling, right 2nd finger mildly restricted flexion range of motion Knees with patellofemoral crepitus more prominent on right, right knee with anterior and joint line tenderness to palpation, no palpable effusion Ankles full ROM no tenderness or swelling  CDAI Exam: CDAI Score: 4  Patient Global: 20 / 100; Provider Global: 10 / 100 Swollen: 0 ; Tender: 1  Joint Exam 04/26/2023      Right  Left  Knee   Tender         Investigation: No additional findings.  Imaging: No results found.  Recent Labs: Lab Results  Component Value Date   WBC 5.7 04/26/2023   HGB 13.0 04/26/2023   PLT 217 04/26/2023   NA 141 04/26/2023   K 4.0 04/26/2023   CL 104 04/26/2023   CO2 29 04/26/2023   GLUCOSE 88 04/26/2023   BUN 13 04/26/2023   CREATININE 0.76 04/26/2023   BILITOT 0.4 04/26/2023   ALKPHOS 43 11/02/2022   AST 13 04/26/2023   ALT 8 04/26/2023   PROT 7.2 04/26/2023   ALBUMIN 3.4 (L) 11/02/2022   CALCIUM 9.3 04/26/2023   GFRAA 99 07/14/2020    Speciality  Comments: No specialty comments available.  Procedures:  No procedures performed Allergies: Cephalosporins, Eicosapentaenoic acid (epa), Fish-derived products, Peanuts [peanut oil], Shrimp [shellfish allergy], Latex, Metronidazole, Methotrexate derivatives, Ciprofloxacin, Lisinopril, Naproxen, Other, and Prednisone   Assessment / Plan:     Visit Diagnoses: Polyarthritis with positive rheumatoid factor (HCC) - Plan: Sedimentation rate, CBC with Differential/Platelet, COMPLETE METABOLIC PANEL WITH GFR, hydroxychloroquine (PLAQUENIL) 200 MG tablet  Joint inflammation appears fairly well-controlled despite having to discontinue the methotrexate.  The knee and shoulder pain in areas with considerable existing osteoarthritis.  Had a recent injection to the knee that was not too beneficial so no repeat recommended today.  Checking sed rate for disease activity monitoring.  Plan to continue hydroxychloroquine 200 mg daily.  Chronic cough  There is reporting concern for cough at night associated with her blood pressure medication.  But on review she is on losartan 100 mg which would not usually be associated with bradykinin mediated chronic cough.  High risk medication use  Discontinued methotrexate questionable if this was contributing to worsened cough as a drug side effect started on the fourth week of treatment.  No associated mucosal lesions no fevers no dyspnea and exam is normal today.  Rechecking CBC and CMP for medication monitoring.  Had ophthalmology exam with Dr. Dione Booze in January some findings consistent for episcleritis  no evidence of hydroxychloroquine retinal toxicity.  Sialadenitis - Plan: ANA, Sjogrens syndrome-A extractable nuclear antibody, Sjogrens syndrome-B extractable nuclear antibody  The episode of sialoadenitis with some residual submandibular swelling with her previous positive ANA could be suggestive for Sjogren syndrome or may be just secondary sicca with longstanding RA.   Will check SSA and SSB antibodies.     Orders: Orders Placed This Encounter  Procedures   Sedimentation rate   CBC with Differential/Platelet   COMPLETE METABOLIC PANEL WITH GFR   ANA   Sjogrens syndrome-A extractable nuclear antibody   Sjogrens syndrome-B extractable nuclear antibody   Anti-nuclear ab-titer (ANA titer)   Meds ordered this encounter  Medications   hydroxychloroquine (PLAQUENIL) 200 MG tablet    Sig: Take 1 tablet (200 mg total) by mouth daily.    Dispense:  30 tablet    Refill:  2     Follow-Up Instructions: Return in about 3 months (around 07/27/2023) for RA/SS HCQ f/u 3mos.   Fuller Plan, MD  Note - This record has been created using [employer].  Chart creation errors have been sought, but may not always  have been located. Such creation errors do not reflect on  the standard of medical care.

## 2023-04-27 LAB — SJOGRENS SYNDROME-A EXTRACTABLE NUCLEAR ANTIBODY: SSA (Ro) (ENA) Antibody, IgG: <1 AI

## 2023-04-28 LAB — COMPLETE METABOLIC PANEL WITH GFR
AG Ratio: 1.2 (calc) (ref 1.0–2.5)
ALT: 8 U/L (ref 6–29)
AST: 13 U/L (ref 10–35)
Albumin: 3.9 g/dL (ref 3.6–5.1)
Alkaline phosphatase (APISO): 45 U/L (ref 37–153)
BUN: 13 mg/dL (ref 7–25)
CO2: 29 mmol/L (ref 20–32)
Calcium: 9.3 mg/dL (ref 8.6–10.4)
Chloride: 104 mmol/L (ref 98–110)
Creat: 0.76 mg/dL (ref 0.50–1.05)
Globulin: 3.3 g/dL (calc) (ref 1.9–3.7)
Glucose, Bld: 88 mg/dL (ref 65–99)
Potassium: 4 mmol/L (ref 3.5–5.3)
Sodium: 141 mmol/L (ref 135–146)
Total Bilirubin: 0.4 mg/dL (ref 0.2–1.2)
Total Protein: 7.2 g/dL (ref 6.1–8.1)
eGFR: 85 mL/min/{1.73_m2} (ref >=60)

## 2023-04-28 LAB — ANTI-NUCLEAR AB-TITER (ANA TITER): ANA Titer 1: 1:80 {titer} — ABNORMAL HIGH

## 2023-04-28 LAB — CBC WITH DIFFERENTIAL/PLATELET
Absolute Monocytes: 405 cells/uL (ref 200–950)
Basophils Absolute: 17 cells/uL (ref 0–200)
Basophils Relative: 0.3 %
Eosinophils Absolute: 91 cells/uL (ref 15–500)
Eosinophils Relative: 1.6 %
HCT: 40.7 % (ref 35.0–45.0)
Hemoglobin: 13 g/dL (ref 11.7–15.5)
Lymphs Abs: 2206 cells/uL (ref 850–3900)
MCH: 28 pg (ref 27.0–33.0)
MCHC: 31.9 g/dL — ABNORMAL LOW (ref 32.0–36.0)
MCV: 87.5 fL (ref 80.0–100.0)
MPV: 9.8 fL (ref 7.5–12.5)
Monocytes Relative: 7.1 %
Neutro Abs: 2981 cells/uL (ref 1500–7800)
Neutrophils Relative %: 52.3 %
Platelets: 217 10*3/uL (ref 140–400)
RBC: 4.65 10*6/uL (ref 3.80–5.10)
RDW: 14.3 % (ref 11.0–15.0)
Total Lymphocyte: 38.7 %
WBC: 5.7 10*3/uL (ref 3.8–10.8)

## 2023-04-28 LAB — SEDIMENTATION RATE: Sed Rate: 43 mm/h — ABNORMAL HIGH (ref 0–30)

## 2023-04-28 LAB — ANA: Anti Nuclear Antibody (ANA): POSITIVE — AB

## 2023-04-28 LAB — SJOGRENS SYNDROME-B EXTRACTABLE NUCLEAR ANTIBODY: SSB (La) (ENA) Antibody, IgG: <1 AI

## 2023-05-11 ENCOUNTER — Encounter: Payer: Self-pay | Admitting: Obstetrics and Gynecology

## 2023-05-11 ENCOUNTER — Ambulatory Visit (INDEPENDENT_AMBULATORY_CARE_PROVIDER_SITE_OTHER): Payer: 59 | Admitting: Obstetrics and Gynecology

## 2023-05-11 VITALS — BP 130/61 | HR 71

## 2023-05-11 DIAGNOSIS — R350 Frequency of micturition: Secondary | ICD-10-CM

## 2023-05-11 DIAGNOSIS — R238 Other skin changes: Secondary | ICD-10-CM

## 2023-05-11 DIAGNOSIS — N8110 Cystocele, unspecified: Secondary | ICD-10-CM

## 2023-05-11 DIAGNOSIS — N993 Prolapse of vaginal vault after hysterectomy: Secondary | ICD-10-CM

## 2023-05-11 DIAGNOSIS — Z4689 Encounter for fitting and adjustment of other specified devices: Secondary | ICD-10-CM

## 2023-05-11 LAB — POCT URINALYSIS DIPSTICK
Bilirubin, UA: NEGATIVE
Glucose, UA: NEGATIVE
Ketones, UA: NEGATIVE
Leukocytes, UA: NEGATIVE
Nitrite, UA: NEGATIVE
Protein, UA: NEGATIVE
Spec Grav, UA: 1.025 (ref 1.010–1.025)
Urobilinogen, UA: 0.2 E.U./dL — AB
pH, UA: 6 (ref 5.0–8.0)

## 2023-05-11 MED ORDER — TRIAMCINOLONE ACETONIDE 0.1 % EX CREA: 1.0000 | TOPICAL_CREAM | Freq: Two times a day (BID) | CUTANEOUS | 0 refills | Status: DC

## 2023-05-11 NOTE — Progress Notes (Addendum)
[facility]   Subjective:     Chief Complaint:  Chief Complaint  Patient presents with   Pessary Check    NYLIA SWEIGART is a 67 y.o. female is here for pessary check.   History of Present Illness: BRIANNI GONZALO is a 67 y.o. female with stage III pelvic organ prolapse who presents for a pessary check. She is using a size #5 long stem gellhorn pessary. The pessary has been working well but she had a constipation episode and the pessary was expelled. She is not using vaginal estrogen. She denies vaginal bleeding.  Past Medical History: Patient  has a past medical history of Acute urinary retention s/p Foley (01/07/2012), Allergy, Anemia, Asthma, CAD (coronary artery disease), Carotid arterial disease (HCC), Carpal tunnel syndrome, bilateral, GERD (gastroesophageal reflux disease), Hemorrhoids, internal, with bleeding & prolapse (12/13/2011), Hernia, abdominal, History of blood transfusion, History of kidney stones, Hyperlipidemia, Hypertension, Iron deficiency (12/07/2011), Keloid scar, Morbid obesity (HCC), Myocardial infarct (HCC), Neuropathy, Osteoarthritis, Osteoporosis, PONV (postoperative nausea and vomiting), Seasonal allergies, Sleep apnea, Tooth abscess, and Uterine cancer (HCC) (05/2014).   Past Surgical History: She  has a past surgical history that includes Knee surgery (Bilateral, 1999); Total knee arthroplasty (Left, 04/29/2011); Wisdom tooth extraction; Colonoscopy (2009); Knee arthroscopy (1991); transanal hemorrhoidal dearterliaization (01/06/2012); Coronary artery bypass graft (N/A, 01/09/2014); Intraoprative transesophageal echocardiogram (N/A, 01/09/2014); TEE without cardioversion (01/2010); NM MYOCAR PERF WALL MOTION (01/2010); Cardiac catheterization; Multiple extractions with alveoloplasty (N/A, 04/11/2014); Vaginal hysterectomy (N/A, 06/03/2014); Unilateral salpingectomy (Right, 06/03/2014); left heart catheterization with coronary angiogram (N/A, 01/04/2014);  Breast reduction surgery (Bilateral, 10/10/2015); Colonoscopy w/ biopsies and polypectomy; Lesion excision with complex repair (N/A, 05/07/2016); Cataract extraction (Right); Artery Biopsy (Left, 07/10/2021); and bladder tack.   Medications: She has a current medication list which includes the following prescription(s): albuterol, alum & mag hydroxide-simeth, amlodipine, amoxicillin-clavulanate, aspirin ec, vitamin d high potency, hydroxychloroquine, losartan, metoprolol tartrate, multivitamin with minerals, nystatin cream, omeprazole, polyethyl glycol-propyl glycol, rosuvastatin, senna-docusate, and triamcinolone cream, and the following Facility-Administered Medications: sodium chloride.   Allergies: Patient is allergic to cephalosporins, eicosapentaenoic acid (epa), fish-derived products, peanuts [peanut oil], shrimp [shellfish allergy], latex, metronidazole, methotrexate derivatives, ciprofloxacin, lisinopril, naproxen, other, and prednisone.   Social History: Patient  reports that she has never smoked. She has never been exposed to tobacco smoke. She has never used smokeless tobacco. She reports that she does not drink alcohol and does not use drugs.      Objective:    Physical Exam: BP 130/61   Pulse 71   LMP 01/13/2014 Comment: spotting in Feb and bleeding since April Gen: No apparent distress, A&O x 3. Detailed Urogynecologic Evaluation:  Pelvic Exam: Normal external female genitalia; Bartholin's and Skene's glands normal in appearance; urethral meatus normal in appearance, no urethral masses or discharge. The pessary was noted to be dislodged. It was removed and cleaned. Speculum exam revealed no lesions in the vagina. The pessary was replaced. It was comfortable to the patient and fit well.    Laboratory Results: Urine dipstick shows:Positive for red blood cells.    Assessment/Plan:    Assessment: Ms. Lemasters is a 67 y.o. with stage III pelvic organ prolapse here for a pessary  check. She is doing well.  Plan: She will keep the pessary in place until next visit. She will continue to use lubricant. She will follow-up in 3 months for a pessary check or sooner as needed.   She is becoming more interested in a surgical repair. We discussed over  the next few months to try and lose a little more weight to make for optimal healing following surgery.   Patient also has a skin irritation rash that she reports she gets yearly and normally treats with Kenalog cream. Will send a prescription to the pharmacy for patient as the dermatitis is noted to be around the legs and skin folds.   All questions were answered.

## 2023-05-27 ENCOUNTER — Ambulatory Visit (INDEPENDENT_AMBULATORY_CARE_PROVIDER_SITE_OTHER): Payer: 59 | Admitting: Student

## 2023-05-27 ENCOUNTER — Encounter: Payer: Self-pay | Admitting: Student

## 2023-05-27 VITALS — BP 150/61 | HR 49 | Temp 97.9°F | Ht 68.0 in | Wt 269.9 lb

## 2023-05-27 DIAGNOSIS — H6123 Impacted cerumen, bilateral: Secondary | ICD-10-CM | POA: Insufficient documentation

## 2023-05-27 DIAGNOSIS — Z0000 Encounter for general adult medical examination without abnormal findings: Secondary | ICD-10-CM

## 2023-05-27 DIAGNOSIS — I10 Essential (primary) hypertension: Secondary | ICD-10-CM | POA: Diagnosis not present

## 2023-05-27 NOTE — Patient Instructions (Addendum)
Thank you, Ms.Artelia A Kosh for allowing [place] to provide your care today. Today we discussed your right ear itching, pain and decreased hearing.  -Ear exam showed earwax built up covering eardrum. We were able to wash and remove the earwax. -Your blood pressure is mildly high. Please make sure to take your losartan and metoprolol. We discussed restarting amlodipine as long as you are tolerating it without much side effects.  -Please let us know if you develop any lightheadedness or dizziness.    -We can recheck your blood pressure at the next visit.   I have ordered the following medication/changed the following medications:   Stop the following medications: There are no discontinued medications.   Start the following medications: No orders of the defined types were placed in this encounter.    Follow up:  2-3 months    Should you have any questions or concerns please call the internal medicine clinic at [phone].    Rana Snare, D.O. [facility]

## 2023-05-27 NOTE — Progress Notes (Signed)
CC: right ear discomfort and decreased hearing  HPI:  [name] is a 67 y.o. female living with a history stated below and presents today for right ear discomfort and decreased hearing. Please see problem based assessment and plan for additional details.  Past Medical History:  Diagnosis Date   Acute urinary retention s/p Foley 01/07/2012   Allergy    Anemia    Asthma    CAD (coronary artery disease)    a. 12/2013 s/p CABG x 5 Dr. Maren Beach (LIMA to LAD, SVG to diagonal, SVG to OM1, SVG to OM2, SVG to PDA)   Carotid arterial disease (HCC)    a. 12/2013 Carotid U/S: 1-39% bilat ICA stenosis.   Carpal tunnel syndrome, bilateral    GERD (gastroesophageal reflux disease)    Hemorrhoids, internal, with bleeding & prolapse 12/13/2011   Hernia, abdominal    History of blood transfusion    CABG and hysterectomy   History of kidney stones    Hyperlipidemia    Hypertension    Iron deficiency 12/07/2011   Keloid scar    a. Sternal Keloid s/p CABG with ongoing pain.   Morbid obesity (HCC)    Myocardial infarct (HCC)    2015   Neuropathy    Osteoarthritis    Osteoporosis    PONV (postoperative nausea and vomiting)    Seasonal allergies    Sleep apnea    no CPAP machine; sleep study 02/2010 REM AHI 61.7/hr, total sleep REM 14.8/hr   Tooth abscess    Uterine cancer (HCC) 05/2014   clinical stage IA grade 1 endometrioid endometrial cancer    Current Outpatient Medications on File Prior to Visit  Medication Sig Dispense Refill   albuterol (VENTOLIN HFA) 108 (90 Base) MCG/ACT inhaler Inhale 2 puffs into the lungs every 6 (six) hours as needed for wheezing or shortness of breath. 1 each 0   alum & mag hydroxide-simeth (MAALOX PLUS) 400-400-40 MG/5ML suspension Take 15 mLs by mouth every 6 (six) hours as needed for indigestion. 355 mL 0   amLODipine (NORVASC) 5 MG tablet Take 1 tablet (5 mg total) by mouth daily. 90 tablet 3   amoxicillin-clavulanate (AUGMENTIN) 875-125 MG tablet  Take 1 tablet by mouth 2 (two) times daily.     aspirin EC 81 MG tablet Take 1 tablet (81 mg total) by mouth daily. Swallow whole. 150 tablet 2   Cholecalciferol (VITAMIN D HIGH POTENCY) 25 MCG (1000 UT) capsule Take 1,000 Units by mouth daily.     hydroxychloroquine (PLAQUENIL) 200 MG tablet Take 1 tablet (200 mg total) by mouth daily. 30 tablet 2   losartan (COZAAR) 100 MG tablet TAKE 1 TABLET BY MOUTH EVERY DAY 90 tablet 3   metoprolol tartrate (LOPRESSOR) 25 MG tablet TAKE 1 AND 1/2 TABLETS BY MOUTH TWICE A DAY 270 tablet 0   Multiple Vitamin (MULTIVITAMIN WITH MINERALS) TABS tablet Take 1 tablet by mouth every Monday, Wednesday, and Friday.     nystatin cream (MYCOSTATIN) Apply 1 application  topically 2 (two) times daily as needed (rash). 30 g 1   omeprazole (PRILOSEC) 20 MG capsule TAKE 1 CAPSULE BY MOUTH DAILY BEFORE BREAKFAST. 90 capsule 1   Polyethyl Glycol-Propyl Glycol (SYSTANE FREE OP) Apply 1 tablet to eye daily as needed (For dry eyes).     rosuvastatin (CRESTOR) 20 MG tablet Take 1 tablet (20 mg total) by mouth daily. 90 tablet 3   senna-docusate (SENOKOT-S) 8.6-50 MG tablet Take by mouth.  triamcinolone cream (KENALOG) 0.1 % Apply 1 Application topically 2 (two) times daily. 30 g 0   Current Facility-Administered Medications on File Prior to Visit  Medication Dose Route Frequency Provider Last Rate Last Admin   0.9 %  sodium chloride infusion  500 mL Intravenous Once Armbruster, Willaim Rayas, MD       Review of Systems: ROS negative except for what is noted on the assessment and plan.  Vitals:   05/27/23 1056 05/27/23 1127  BP: (!) 159/66 (!) 150/61  Pulse: (!) 54 (!) 49  Temp: 97.9 F (36.6 C)   TempSrc: Oral   SpO2: 100%   Weight: 269 lb 14.4 oz (122.4 kg)   Height: 5\' 8"  (1.727 m)    Physical Exam: Constitutional: well-appearing female sitting in chair comfortably, in no acute distress HENT: cerumen impacted on right ear and unable to visualize tympanic membrane,  cerumen impacted on left ear but able to visualize upper half of tympanic membrane, decreased hearing noted; s/p ear lavage showed intact TM bilaterally Cardiovascular: bradycardia, normal rhythm, no LE edema Pulmonary/Chest: normal work of breathing on room air, lungs clear to auscultation bilaterally Neurological: alert & oriented x 3 Skin: warm and dry Psych: pleasant mood  Assessment & Plan:   Hearing loss of both ears due to cerumen impaction [H61.23] Presents with 3 weeks of decreased hearing associated with ear itching and initially pain. Pain has resolved at this time. States can hear most sounds but difficulty hearing how loud she is when speaking. Denies ear swelling or significant tenderness. Has tried Debrox without much success. Exam today showed impacted cerumen of right and unable to visualize TM, impacted cerumen of left and partially visualized TM. No swelling, discharge, bleeding or erythema. Performed bilateral lavage with successful removal of impacted cerumen, patient tolerated well. Reassessment showed intact TM and no erythema or swelling of ear canal. Patient states she feels better after lavage.   Essential hypertension BP remains elevated on repeat at 150/61 from 159/66. She is taking losartan 100 mg daily and metoprolol 37.5 mg in AM and 25 mg in PM (metoprolol originally prescribed as 37.5 mg BID). She has stopped amlodipine due to side effects 3 months ago. She is unable to recall what side effects. Discussed restarting amlodipine if tolerated if no further side effects. Will not increase metoprolol due to HR in 50-60s. She denies any lightheadedness or dizziness.   Plan -Continue losartan and metoprolol -Restart amlodipine 5 mg daily if tolerated  -Reassess BP at next OV  Health care maintenance She received PCV-13 in 2021 and previously 2 doses of PPSV 23. According to current guidelines, she would need PCV 20 at least 5 years from last pneumococcal vaccine so due  on 06/2025.    Patient discussed with Dr. Docia Furl, D.O. [facility], PGY-2 Phone: [phone] Date 05/27/2023 Time 12:21 PM

## 2023-05-27 NOTE — Assessment & Plan Note (Addendum)
Presents with 3 weeks of decreased hearing associated with ear itching and initially pain. Pain has resolved at this time. States can hear most sounds but difficulty hearing how loud she is when speaking. Denies ear swelling or significant tenderness. Has tried Debrox without much success. Exam today showed impacted cerumen of right and unable to visualize TM, impacted cerumen of left and partially visualized TM. No swelling, discharge, bleeding or erythema. Performed bilateral lavage with successful removal of impacted cerumen, patient tolerated well. Reassessment showed intact TM and no erythema or swelling of ear canal. Patient states she feels better after lavage.

## 2023-05-27 NOTE — Assessment & Plan Note (Signed)
She received PCV-13 in 2021 and previously 2 doses of PPSV 23. According to current guidelines, she would need PCV 20 at least 5 years from last pneumococcal vaccine so due on 06/2025.

## 2023-05-27 NOTE — Assessment & Plan Note (Addendum)
BP remains elevated on repeat at 150/61 from 159/66. She is taking losartan 100 mg daily and metoprolol 37.5 mg in AM and 25 mg in PM (metoprolol originally prescribed as 37.5 mg BID). She has stopped amlodipine due to side effects 3 months ago. She is unable to recall what side effects. Discussed restarting amlodipine if tolerated if no further side effects. Will not increase metoprolol due to HR in 50-60s. She denies any lightheadedness or dizziness.   Plan -Continue losartan and metoprolol -Restart amlodipine 5 mg daily if tolerated  -Reassess BP at next OV

## 2023-05-30 NOTE — Progress Notes (Signed)
Internal Medicine Clinic Attending  Case discussed with the resident at the time of the visit.  We reviewed the resident's history and exam and pertinent patient test results.  I agree with the assessment, diagnosis, and plan of care documented in the resident's note.  

## 2023-06-21 ENCOUNTER — Ambulatory Visit: Payer: 59 | Admitting: Obstetrics and Gynecology

## 2023-06-22 ENCOUNTER — Ambulatory Visit (INDEPENDENT_AMBULATORY_CARE_PROVIDER_SITE_OTHER): Payer: 59 | Admitting: Obstetrics and Gynecology

## 2023-06-22 ENCOUNTER — Encounter: Payer: Self-pay | Admitting: Obstetrics and Gynecology

## 2023-06-22 ENCOUNTER — Other Ambulatory Visit (HOSPITAL_COMMUNITY)
Admission: RE | Admit: 2023-06-22 | Discharge: 2023-06-22 | Disposition: A | Discharge status: Home / Self Care | Payer: 59 | Source: Ambulatory Visit

## 2023-06-22 VITALS — BP 112/76 | HR 76

## 2023-06-22 DIAGNOSIS — L292 Pruritus vulvae: Secondary | ICD-10-CM | POA: Diagnosis not present

## 2023-06-22 DIAGNOSIS — N898 Other specified noninflammatory disorders of vagina: Secondary | ICD-10-CM | POA: Diagnosis not present

## 2023-06-22 MED ORDER — CLOBETASOL PROPIONATE E 0.05 % EX CREA
1.0000 | TOPICAL_CREAM | Freq: Every evening | CUTANEOUS | 2 refills | Status: DC
Start: 2023-06-22 — End: 2024-01-21

## 2023-06-22 MED ORDER — FLUCONAZOLE 150 MG PO TABS
150.0000 mg | ORAL_TABLET | Freq: Every day | ORAL | 0 refills | Status: DC
Start: 2023-06-22 — End: 2023-12-16

## 2023-06-22 NOTE — Patient Instructions (Signed)
Take one dose of diflucan tonight and use the cream for the next 2 weeks on the vaginal lips.

## 2023-06-22 NOTE — Progress Notes (Signed)
[facility] Return Visit  SUBJECTIVE  History of Present Illness: [name] is a 67 y.o. female seen for vaginal itching and external labia itching. She reports she has been having irritation in the skin and she has been putting vinegar on the area.     Past Medical History: Patient  has a past medical history of Acute urinary retention s/p Foley (01/07/2012), Allergy, Anemia, Asthma, CAD (coronary artery disease), Carotid arterial disease (HCC), Carpal tunnel syndrome, bilateral, GERD (gastroesophageal reflux disease), Hemorrhoids, internal, with bleeding & prolapse (12/13/2011), Hernia, abdominal, History of blood transfusion, History of kidney stones, Hyperlipidemia, Hypertension, Iron deficiency (12/07/2011), Keloid scar, Morbid obesity (HCC), Myocardial infarct (HCC), Neuropathy, Osteoarthritis, Osteoporosis, PONV (postoperative nausea and vomiting), Seasonal allergies, Sleep apnea, Tooth abscess, and Uterine cancer (HCC) (05/2014).   Past Surgical History: She  has a past surgical history that includes Knee surgery (Bilateral, 1999); Total knee arthroplasty (Left, 04/29/2011); Wisdom tooth extraction; Colonoscopy (2009); Knee arthroscopy (1991); transanal hemorrhoidal dearterliaization (01/06/2012); Coronary artery bypass graft (N/A, 01/09/2014); Intraoprative transesophageal echocardiogram (N/A, 01/09/2014); TEE without cardioversion (01/2010); NM MYOCAR PERF WALL MOTION (01/2010); Cardiac catheterization; Multiple extractions with alveoloplasty (N/A, 04/11/2014); Vaginal hysterectomy (N/A, 06/03/2014); Unilateral salpingectomy (Right, 06/03/2014); left heart catheterization with coronary angiogram (N/A, 01/04/2014); Breast reduction surgery (Bilateral, 10/10/2015); Colonoscopy w/ biopsies and polypectomy; Lesion excision with complex repair (N/A, 05/07/2016); Cataract extraction (Right); Artery Biopsy (Left, 07/10/2021); and bladder tack.   Medications: She has a current  medication list which includes the following prescription(s): albuterol, alum & mag hydroxide-simeth, amlodipine, amoxicillin-clavulanate, aspirin ec, vitamin d high potency, clobetasol propionate e, fluconazole, hydroxychloroquine, losartan, metoprolol tartrate, multivitamin with minerals, nystatin cream, omeprazole, polyethyl glycol-propyl glycol, rosuvastatin, senna-docusate, and triamcinolone cream, and the following Facility-Administered Medications: sodium chloride.   Allergies: Patient is allergic to cephalosporins, eicosapentaenoic acid (epa), fish-derived products, peanuts [peanut oil], shrimp [shellfish allergy], latex, metronidazole, methotrexate derivatives, ciprofloxacin, lisinopril, naproxen, other, and prednisone.   Social History: Patient  reports that she has never smoked. She has never been exposed to tobacco smoke. She has never used smokeless tobacco. She reports that she does not drink alcohol and does not use drugs.      OBJECTIVE     Physical Exam: Vitals:   06/22/23 1342  BP: 112/76  Pulse: 76   Gen: No apparent distress, A&O x 3.  Detailed Urogynecologic Evaluation:   On exam, patient's Gellhorn pessary was noted to be in place. She also had thick white discharge coming around the area and she has vulvar redness. The skin at the vulva is dry and has some small tears. No bleeding, mass, or other concerning signs on exam.    ASSESSMENT AND PLAN    [name] is a 67 y.o. with:  1. Vaginal itching   2. Vulvar itching    Patient has some white discharge concerning for possible yeast. Will treat this with Diflucan. Aptima swab obtained during exam to rule out other forms of yeast and BV.  Patient also has vulvar irritation that is red with small tears in the skin on exam. No bleeding noted. Will treat this with external clobetasol cream to decrease her itching so skin can heal. We also discussed not using soap products in the area to decrease risk of irritation to  the vaginal tissues and skin.   Patient to follow up for pessary cleaning as scheduled and will call if symptoms are not improving.

## 2023-07-13 ENCOUNTER — Other Ambulatory Visit: Payer: Self-pay | Admitting: Nurse Practitioner

## 2023-07-13 ENCOUNTER — Other Ambulatory Visit: Payer: Self-pay | Admitting: Internal Medicine

## 2023-07-13 DIAGNOSIS — M0580 Other rheumatoid arthritis with rheumatoid factor of unspecified site: Secondary | ICD-10-CM

## 2023-07-13 NOTE — Telephone Encounter (Signed)
Last Fill: 04/26/2023  Eye exam: not on file    Labs: 04/26/2023 MCHC 31.9  Next Visit: 07/27/2023  Last Visit: 04/26/2023  [id number] with positive rheumatoid factor   Current Dose per office note 04/26/2023: hydroxychloroquine 200 mg daily   Contacted the patient to inquire about the PLQ eye exam. Patient states she has not had one yet but she will call Dr. Dione Booze to get one scheduled and will call back with the date.    Okay to refill Plaquenil?

## 2023-07-13 NOTE — Progress Notes (Deleted)
Office Visit Note  Patient: [name]             Date of Birth: 1956/03/04           MRN: [id number]             PCP: Tyson Alias, MD Referring: Tyson Alias Visit Date: 07/27/2023   Subjective:  No chief complaint on file.   History of Present Illness: [name] is a 67 y.o. female here for follow up for seropositive rheumatoid arthritis    Previous HPI 04/26/2023 [name] is a 67 y.o. female here for follow up for seropositive rheumatoid arthritis.  She was previously taking methotrexate 10 mg subcu weekly took this for a month twice but discontinued after completing the prescription refill and is also complaining of experiencing shortness of breath by the fourth week of treatment each time.  Also has some persistent cough she states this is more prominent when she takes her medication specifically blames what she thinks is lisinopril and describes a blue hypertension medication.  Now off any specific RA medicine for the past month does not feel like she has had a severe exacerbation of joint symptoms.  She saw orthopedic surgery clinic about right knee osteoarthritis and was treated with intra-articular steroid injection benefit lasting only a few weeks.  Still with a lot of chronic pain and decreased mobility in the left shoulder.  Recently went on treatment with Augmentin prescribed on May 26 apparently with what sounds like bilateral submandibular gland swelling and was diagnosed as acute sialoadenitis.  Symptoms are improving still with some tenderness in the area.  Has chronic dry mouth did not notice any new lesions or specific inflamed areas in the mouth.  Continues using Systane for chronic dry eyes last visit reviewed by Dr. Dione Booze in January.  Has history of episcleritis but appears well-controlled.     Previous HPI 01/19/23 [name] is a 67 y.o. female here for follow up for seropositive RA on methotrexate 10 mg subcu weekly and folic  acid 1 mg daily.  Left shoulder was doing very well after injection from last visit she has noticed somewhat worsening of pain in this area again though.  Her main issue by far is right knee pain and stiffness.  She recently discussed this problem with PCP office with updated x-ray obtained showing severe medial compartment bone-on-bone osteoarthritis with associated varus alignment of the knee.  The other issue limiting her mobility is neuropathy in the legs and feet.   Previous HPI 10/19/22 [name] is a 67 y.o. female here for follow up for seropositive RA. She was prescribed methotrexate 10 mg PO weekly and took this medicine noticing improvement in joint pain symptoms especially arms and hands. However noticed considerable nausea when taking each dose and discontinued the medication after initial 5 week prescription. Currently right shoulder remains doing very well since her injection earlier in the year. Left shoulder is very limited and also right knee is stiff especially after prolonged sitting but not in any exacerbation.    Previous HPI 07/21/22 [name] is a 67 y.o. female here for follow up for follow up for probable seropositive RA.  After our last visit had been prescribed methotrexate to start as initial low-dose treatment option for RA or even steroid sparing coverage for PMR.  She has been concerned about medication side effects and has taken 0 doses of methotrexate.  However since getting intra-articular  steroid injection for the right shoulder is doing dramatically better her left shoulder is actually more painful now at this time.  She also describes some ongoing joint stiffness in her hands but right hand is also more flexible after the shoulder steroid injection.  She sees finger triggering of her second digit most commonly at night or in the early morning.  She is starting to redevelop some right knee effusion after previous aspiration injection a couple months ago.   Previous synovial fluid analyses have been bland with white blood cell count very low or up to low 300s.     Previous HPI 04/21/2022 [name] is a 67 y.o. female here for follow up for chronic joint pains especially bilateral shoulder pain with significant morning stiffness and previous concern for PMR with ultrasound findings consistent with rotator cuff tendonitis.  Since our visit she continues having joint pains in several areas although these are not severely limiting her activity.  She has seen some swelling coming and going in the second MCP joint of her right hand.  She also keeps some amount of pain and swelling in the right knee consistently.  Lab tests at our initial visit showed normal sedimentation rate negative hepatitis screening and normal LFTs. Xrays demonstrating OA most severe in 1st CMC joints otherwise not so advanced and no erosions.   Previous HPI 03/31/2022 [name] is a 67 y.o. female here for evaluation of chronic joint pains especially bilateral shoulder pain with significant morning stiffness and previous concern for PMR with ultrasound findings consistent with rotator cuff tendonitis. She has longstanding knee and lumbar spine osteoarthritis which limits mobility. Previous knee aspiration with non inflammatory synovial fluid analysis 2 and 3 years ago. She had concern for GCA due to onset of headaches and highly elevated sedimentation rate test last year, but negative TA biopsy was obtained with Dr. Dione Booze. She did not take recommended prednisone course due to side effects. Shoulder pain and also decreased hip flexion mobility ongoing symptoms despite resolution of headache complaints from last year.  More recent inflammatory markers in February or decreased to normal but had low positive rheumatoid factor.   Labs reviewed 12/2021 RF 24.3 CCP neg ESR 11 CRP 6   06/2021 ESR 100   08/2017 HCV neg   No Rheumatology ROS completed.   PMFS History:  Patient  Active Problem List   Diagnosis Date Noted   Hearing loss of both ears due to cerumen impaction [H61.23] 05/27/2023   Polyarthritis with positive rheumatoid factor (HCC) 03/31/2022   High risk medication use 03/31/2022   Tendinitis of both rotator cuffs 09/07/2021   Keloid scar of skin 09/07/2021   Pes planus of left foot 09/08/2020   Hallux valgus 09/08/2020   Glucose intolerance (impaired glucose tolerance) 07/14/2020   Vitamin D deficiency 07/14/2020   Cough 03/15/2019   Depression 10/16/2018   Osteoarthritis of left shoulder 10/24/2017   Peripheral neuropathy 08/19/2017   Atherosclerosis of aorta (HCC) 06/20/2015   Osteoarthritis of right knee 11/07/2014   Health care maintenance 10/09/2014   Coronary atherosclerosis of native coronary artery 01/05/2014   Morbid obesity due to excess calories (HCC) 05/10/2012   Obstructive sleep apnea 03/27/2010   GERD 08/06/2008   Hyperlipidemia 10/31/2007   Essential hypertension 06/30/2007    Past Medical History:  Diagnosis Date   Acute urinary retention s/p Foley 01/07/2012   Allergy    Anemia    Asthma    CAD (coronary artery disease)  a. 12/2013 s/p CABG x 5 Dr. Maren Beach (LIMA to LAD, SVG to diagonal, SVG to OM1, SVG to OM2, SVG to PDA)   Carotid arterial disease (HCC)    a. 12/2013 Carotid U/S: 1-39% bilat ICA stenosis.   Carpal tunnel syndrome, bilateral    GERD (gastroesophageal reflux disease)    Hemorrhoids, internal, with bleeding & prolapse 12/13/2011   Hernia, abdominal    History of blood transfusion    CABG and hysterectomy   History of kidney stones    Hyperlipidemia    Hypertension    Iron deficiency 12/07/2011   Keloid scar    a. Sternal Keloid s/p CABG with ongoing pain.   Morbid obesity (HCC)    Myocardial infarct (HCC)    2015   Neuropathy    Osteoarthritis    Osteoporosis    PONV (postoperative nausea and vomiting)    Seasonal allergies    Sleep apnea    no CPAP machine; sleep study 02/2010 REM AHI  61.7/hr, total sleep REM 14.8/hr   Tooth abscess    Uterine cancer (HCC) 05/2014   clinical stage IA grade 1 endometrioid endometrial cancer    Family History  Problem Relation Age of Onset   Heart attack Mother 93   Heart disease Mother    Hypertension Mother    Diabetes Mother    Hypertension Sister    Heart failure Sister    Heart disease Sister    Diabetes Sister    Kidney disease Sister    Kidney disease Brother    Hypertension Daughter    Bipolar disorder Son    Schizophrenia Son    Anxiety disorder Son    Esophageal cancer Other        nephew   Colon cancer Neg Hx    Pancreatic cancer Neg Hx    Stomach cancer Neg Hx    Liver disease Neg Hx    Rectal cancer Neg Hx    Past Surgical History:  Procedure Laterality Date   ARTERY BIOPSY Left 07/10/2021   Procedure: LEFT TEMPORAL ARTERY BIOPSY;  Surgeon: Nada Libman, MD;  Location: [facility] OR;  Service: Vascular;  Laterality: Left;   bladder tack     BREAST REDUCTION SURGERY Bilateral 10/10/2015   Procedure: BILATERAL MAMMARY REDUCTION  (BREAST) WITH FREE NIPPLE GRAFT;  Surgeon: Glenna Fellows, MD;  Location: [facility] OR;  Service: Plastics;  Laterality: Bilateral;   CARDIAC CATHETERIZATION     CATARACT EXTRACTION Right    COLONOSCOPY  2009   COLONOSCOPY W/ BIOPSIES AND POLYPECTOMY     CORONARY ARTERY BYPASS GRAFT N/A 01/09/2014   Procedure: CORONARY ARTERY BYPASS GRAFTING (CABG) x 5 using left internal mammary artery and right leg greater saphenous vein harvested endoscopically;  Surgeon: Kerin Perna, MD;  Location: [facility] OR;  Service: Open Heart Surgery;  Laterality: N/A;  please use bed extenders and breast binder   INTRAOPERATIVE TRANSESOPHAGEAL ECHOCARDIOGRAM N/A 01/09/2014   Procedure: INTRAOPERATIVE TRANSESOPHAGEAL ECHOCARDIOGRAM;  Surgeon: Kerin Perna, MD;  Location: [facility];  Service: Open Heart Surgery;  Laterality: N/A;   KNEE ARTHROSCOPY  1991   KNEE SURGERY Bilateral 1999   LEFT HEART CATHETERIZATION WITH  CORONARY ANGIOGRAM N/A 01/04/2014   Procedure: LEFT HEART CATHETERIZATION WITH CORONARY ANGIOGRAM;  Surgeon: Lesleigh Noe, MD;  Location: [facility] CATH LAB;  Service: Cardiovascular;  Laterality: N/A;   LESION EXCISION WITH COMPLEX REPAIR N/A 05/07/2016   Procedure: COMPLEX REPAIR OF CHEST 16 CM;  Surgeon: Glenna Fellows, MD;  Location: [facility] OR;  Service: Plastics;  Laterality: N/A;   MULTIPLE EXTRACTIONS WITH ALVEOLOPLASTY N/A 04/11/2014   Procedure: Extraction of tooth #'s 1,2,8,16 with alveoloplasty, maxillary tuberosity reductions, and gross debridement of remaining teeth.;  Surgeon: Charlynne Pander, DDS;  Location: [facility] OR;  Service: Oral Surgery;  Laterality: N/A;   NM MYOCAR PERF WALL MOTION  01/2010   dipyridamole myoview - moderate perfusion defect in basal inferoseptal, basal inferior, mid inferoseptal, mid inferior, apical inferior region; EF 56%   TEE WITHOUT CARDIOVERSION  01/2010   EF 60-65%, small, flat, non-infiltrating, calcified, fixed apical/septal mass   TOTAL KNEE ARTHROPLASTY Left 04/29/2011   transanal hemorrhoidal dearterliaization  01/06/2012   with external hemorrhoid removal   UNILATERAL SALPINGECTOMY Right 06/03/2014   Procedure: UNILATERAL SALPINGECTOMY;  Surgeon: Willodean Rosenthal, MD;  Location: [facility] ORS;  Service: Gynecology;  Laterality: Right;   VAGINAL HYSTERECTOMY N/A 06/03/2014   Procedure: HYSTERECTOMY VAGINAL;  Surgeon: Willodean Rosenthal, MD;  Location: [facility] ORS;  Service: Gynecology;  Laterality: N/A;   WISDOM TOOTH EXTRACTION     Social History   Social History Narrative   Current Social History 08/27/2020        Patient lives alone in a handicap-accessible ground floor apartment which is 1 story. There are not steps up to the entrance the patient uses.       Patient's method of transportation is personal car or family member.      The highest level of education was 2 years of college.      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 [employer]      Right Handed      Identified important Relationships are My grandchildren, daughter, son       Pets : None       Interests / Fun: Plants       Current Stressors: Son with special needs (Bi-polar/schizophrenia) , he is not living with her        Religious / Personal Beliefs: You [employer] mentors (Vegan videos)      SChaplin, RN,BSN          Immunization History  Administered Date(s) Administered   Influenza Split 01/07/2012   Influenza Whole 10/31/2007, 09/18/2009, 09/25/2010   Influenza, Seasonal, Injecte, Preservative Fre 11/28/2012   Influenza,inj,Quad PF,6+ Mos 08/07/2013, 01/02/2014, 08/01/2015, 09/20/2016, 09/05/2017, 10/16/2018   Influenza-Unspecified 09/07/2021   PFIZER(Purple Top)SARS-COV-2 Vaccination 04/10/2020, 05/05/2020   Pneumococcal Conjugate-13 07/14/2020   Pneumococcal Polysaccharide-23 01/07/2012, 10/24/2017   Td 10/15/2009   Tdap 02/23/2021     Objective: Vital Signs: LMP 01/13/2014 Comment: spotting in Feb and bleeding since April   Physical Exam   Musculoskeletal Exam: ***  CDAI Exam: CDAI Score: -- Patient Global: --; Provider Global: -- Swollen: --; Tender: -- Joint Exam 07/27/2023   No joint exam has been documented for this visit   There is currently no information documented on the homunculus. Go to the Rheumatology activity and complete the homunculus joint exam.  Investigation: No additional findings.  Imaging: No results found.  Recent Labs: Lab Results  Component Value Date   WBC 5.7 04/26/2023   HGB 13.0 04/26/2023   PLT 217 04/26/2023   NA 141 04/26/2023   K 4.0 04/26/2023   CL 104 04/26/2023   CO2 29 04/26/2023   GLUCOSE 88 04/26/2023   BUN 13 04/26/2023   CREATININE 0.76 04/26/2023   BILITOT 0.4 04/26/2023   ALKPHOS 43 11/02/2022   AST 13 04/26/2023   ALT 8 04/26/2023  PROT 7.2 04/26/2023   ALBUMIN 3.4 (L) 11/02/2022   CALCIUM 9.3 04/26/2023   GFRAA 99 07/14/2020     Speciality Comments: No specialty comments available.  Procedures:  No procedures performed Allergies: Cephalosporins, Eicosapentaenoic acid (epa), Fish-derived products, Peanuts [peanut oil], Shrimp [shellfish allergy], Latex, Metronidazole, Methotrexate derivatives, Ciprofloxacin, Lisinopril, Naproxen, Other, and Prednisone   Assessment / Plan:     Visit Diagnoses: No diagnosis found.  ***  Orders: No orders of the defined types were placed in this encounter.  No orders of the defined types were placed in this encounter.    Follow-Up Instructions: No follow-ups on file.   Metta Clines, RT  Note - This record has been created using [employer].  Chart creation errors have been sought, but may not always  have been located. Such creation errors do not reflect on  the standard of medical care.

## 2023-07-14 ENCOUNTER — Ambulatory Visit: Payer: 59 | Admitting: Podiatry

## 2023-07-27 ENCOUNTER — Ambulatory Visit: Payer: 59 | Admitting: Internal Medicine

## 2023-07-27 DIAGNOSIS — M0580 Other rheumatoid arthritis with rheumatoid factor of unspecified site: Secondary | ICD-10-CM

## 2023-07-27 DIAGNOSIS — K1120 Sialoadenitis, unspecified: Secondary | ICD-10-CM

## 2023-07-27 DIAGNOSIS — R053 Chronic cough: Secondary | ICD-10-CM

## 2023-07-27 DIAGNOSIS — Z79899 Other long term (current) drug therapy: Secondary | ICD-10-CM

## 2023-08-11 ENCOUNTER — Ambulatory Visit: Payer: 59 | Admitting: Podiatry

## 2023-08-23 ENCOUNTER — Ambulatory Visit: Payer: 59 | Admitting: Podiatry

## 2023-09-15 ENCOUNTER — Other Ambulatory Visit: Payer: Self-pay | Admitting: Internal Medicine

## 2023-09-15 DIAGNOSIS — M0580 Other rheumatoid arthritis with rheumatoid factor of unspecified site: Secondary | ICD-10-CM

## 2023-09-21 ENCOUNTER — Ambulatory Visit: Payer: 59 | Admitting: Obstetrics and Gynecology

## 2023-09-26 ENCOUNTER — Ambulatory Visit: Payer: 59 | Admitting: Obstetrics and Gynecology

## 2023-09-30 ENCOUNTER — Other Ambulatory Visit (HOSPITAL_COMMUNITY)
Admission: RE | Admit: 2023-09-30 | Discharge: 2023-09-30 | Disposition: A | Discharge status: Home / Self Care | Payer: 59 | Source: Ambulatory Visit | Attending: Obstetrics and Gynecology | Admitting: Obstetrics and Gynecology

## 2023-09-30 ENCOUNTER — Ambulatory Visit (INDEPENDENT_AMBULATORY_CARE_PROVIDER_SITE_OTHER): Payer: 59 | Admitting: Obstetrics and Gynecology

## 2023-09-30 VITALS — BP 152/73 | HR 75

## 2023-09-30 DIAGNOSIS — N813 Complete uterovaginal prolapse: Secondary | ICD-10-CM | POA: Diagnosis not present

## 2023-09-30 DIAGNOSIS — N898 Other specified noninflammatory disorders of vagina: Secondary | ICD-10-CM

## 2023-09-30 DIAGNOSIS — B3731 Acute candidiasis of vulva and vagina: Secondary | ICD-10-CM | POA: Diagnosis not present

## 2023-09-30 DIAGNOSIS — N993 Prolapse of vaginal vault after hysterectomy: Secondary | ICD-10-CM

## 2023-09-30 DIAGNOSIS — N8110 Cystocele, unspecified: Secondary | ICD-10-CM

## 2023-09-30 DIAGNOSIS — R238 Other skin changes: Secondary | ICD-10-CM

## 2023-09-30 MED ORDER — FLUCONAZOLE 150 MG PO TABS: 150.0000 mg | ORAL_TABLET | Freq: Every evening | ORAL | 0 refills | Status: DC

## 2023-09-30 MED ORDER — NYSTATIN 100000 UNIT/GM EX OINT: 1.0000 | TOPICAL_OINTMENT | Freq: Two times a day (BID) | CUTANEOUS | 0 refills | Status: DC

## 2023-09-30 NOTE — Patient Instructions (Addendum)
Use the Diflucan for the vaginal yeast and the Nystatin cream around the skin itching areas.   If you become constipated please consider using Miralax.   Please follow up with Dr. Florian Buff to discuss surgical planning.

## 2023-09-30 NOTE — Progress Notes (Signed)
[name]   Subjective:     Chief Complaint:  Chief Complaint  Patient presents with   Follow-up    [name] is a 67 y.o. female is here for 3 month pessary check.   History of Present Illness: [name] is a 67 y.o. female with stage III pelvic organ prolapse who presents for a pessary check. She is using a size #5 long stem gellhorn pessary. The pessary is not reducing her prolapse well at this time. She is using vaginal estrogen. She denies vaginal bleeding.  Past Medical History: Patient  has a past medical history of Acute urinary retention s/p Foley (01/07/2012), Allergy, Anemia, Asthma, CAD (coronary artery disease), Carotid arterial disease (HCC), Carpal tunnel syndrome, bilateral, GERD (gastroesophageal reflux disease), Hemorrhoids, internal, with bleeding & prolapse (12/13/2011), Hernia, abdominal, History of blood transfusion, History of kidney stones, Hyperlipidemia, Hypertension, Iron deficiency (12/07/2011), Keloid scar, Morbid obesity (HCC), Myocardial infarct (HCC), Neuropathy, Osteoarthritis, Osteoporosis, PONV (postoperative nausea and vomiting), Seasonal allergies, Sleep apnea, Tooth abscess, and Uterine cancer (HCC) (05/2014).   Past Surgical History: She  has a past surgical history that includes Knee surgery (Bilateral, 1999); Total knee arthroplasty (Left, 04/29/2011); Wisdom tooth extraction; Colonoscopy (2009); Knee arthroscopy (1991); transanal hemorrhoidal dearterliaization (01/06/2012); Coronary artery bypass graft (N/A, 01/09/2014); Intraoprative transesophageal echocardiogram (N/A, 01/09/2014); TEE without cardioversion (01/2010); NM MYOCAR PERF WALL MOTION (01/2010); Cardiac catheterization; Multiple extractions with alveoloplasty (N/A, 04/11/2014); Vaginal hysterectomy (N/A, 06/03/2014); Unilateral salpingectomy (Right, 06/03/2014); left heart catheterization with coronary angiogram (N/A, 01/04/2014); Breast reduction surgery (Bilateral,  10/10/2015); Colonoscopy w/ biopsies and polypectomy; Lesion excision with complex repair (N/A, 05/07/2016); Cataract extraction (Right); Artery Biopsy (Left, 07/10/2021); and bladder tack.   Medications: She has a current medication list which includes the following prescription(s): albuterol, alum & mag hydroxide-simeth, amlodipine, amoxicillin-clavulanate, aspirin ec, vitamin d high potency, clobetasol propionate e, fluconazole, fluconazole, hydroxychloroquine, losartan, metoprolol tartrate, multivitamin with minerals, nystatin ointment, omeprazole, polyethyl glycol-propyl glycol, rosuvastatin, senna-docusate, and triamcinolone cream, and the following Facility-Administered Medications: sodium chloride.   Allergies: Patient is allergic to cephalosporins, eicosapentaenoic acid (epa), fish-derived products, peanuts [peanut oil], shrimp [shellfish allergy], latex, metronidazole, methotrexate derivatives, ciprofloxacin, lisinopril, naproxen, other, and prednisone.   Social History: Patient  reports that she has never smoked. She has never been exposed to tobacco smoke. She has never used smokeless tobacco. She reports that she does not drink alcohol and does not use drugs.      Objective:    Physical Exam: BP (!) 152/73   Pulse 75   LMP 01/13/2014 Comment: spotting in Feb and bleeding since April Gen: No apparent distress, A&O x 3. Detailed Urogynecologic Evaluation:  Pelvic Exam: Normal external female genitalia; Bartholin's and Skene's glands normal in appearance; urethral meatus normal in appearance, no urethral masses or discharge. The pessary was noted to be dislodged. It was removed and cleaned. Speculum exam revealed white vaginal d/c suspicious for yeast in the vagina. The pessary was changed to a #7 Long Stem Gellhorn (Lot [id number]). It was comfortable to the patient and fit well.     Assessment/Plan:    Assessment: [name] is a 67 y.o. with stage III pelvic organ prolapse here  for a pessary check. She is doing well.  Plan: She will keep the pessary in place until next visit. She will continue to use estrogen. She will follow-up in 4 weeks for a pessary check or sooner as needed. She would like to meet with Dr. Florian Buff to discuss surgery. Previously it  was discussed to do an anterior repair with SLF and she would probably need a perineorrhaphy as well. Dr. Florian Buff will make a more formal surgical plan with patient.   Diflucan sent to pharmacy for patient's possible yeast and Nystatin cream prescribed for skin fold irritation and possible skin yeast. Aptima swab sent to confirm vulvovaginal yeast.   All questions were answered.   Time Spent:

## 2023-10-03 LAB — CERVICOVAGINAL ANCILLARY ONLY
Bacterial Vaginitis (gardnerella): POSITIVE — AB
Candida Glabrata: NEGATIVE
Candida Vaginitis: POSITIVE — AB
Comment: NEGATIVE
Comment: NEGATIVE
Comment: NEGATIVE

## 2023-10-05 ENCOUNTER — Other Ambulatory Visit: Payer: Self-pay | Admitting: Obstetrics and Gynecology

## 2023-10-05 DIAGNOSIS — N760 Acute vaginitis: Secondary | ICD-10-CM

## 2023-10-05 MED ORDER — CLINDAMYCIN PHOSPHATE 2 % VA CREA: 1.0000 | TOPICAL_CREAM | Freq: Every day | VAGINAL | 0 refills | Status: AC

## 2023-10-07 NOTE — Progress Notes (Signed)
Pt was contacted and notified of the message below. Pt verbalized understanding and will pick up her medication

## 2023-10-28 ENCOUNTER — Other Ambulatory Visit: Payer: Self-pay | Admitting: Obstetrics and Gynecology

## 2023-10-28 ENCOUNTER — Encounter: Payer: Self-pay | Admitting: Obstetrics and Gynecology

## 2023-10-28 ENCOUNTER — Telehealth: Payer: Self-pay | Admitting: *Deleted

## 2023-10-28 ENCOUNTER — Ambulatory Visit (INDEPENDENT_AMBULATORY_CARE_PROVIDER_SITE_OTHER): Payer: 59 | Admitting: Obstetrics and Gynecology

## 2023-10-28 VITALS — BP 185/89 | HR 65 | Ht 67.32 in | Wt 271.0 lb

## 2023-10-28 DIAGNOSIS — N993 Prolapse of vaginal vault after hysterectomy: Secondary | ICD-10-CM | POA: Diagnosis not present

## 2023-10-28 DIAGNOSIS — N393 Stress incontinence (female) (male): Secondary | ICD-10-CM

## 2023-10-28 DIAGNOSIS — N3281 Overactive bladder: Secondary | ICD-10-CM

## 2023-10-28 MED ORDER — TROSPIUM CHLORIDE ER 60 MG PO CP24: 1.0000 | ORAL_CAPSULE | Freq: Every day | ORAL | 5 refills | Status: DC

## 2023-10-28 NOTE — Telephone Encounter (Signed)
Pt has been scheduled to see Dr. Rennis Golden, 01/06/24, clearance will be addressed at that time.  Will route to requesting surgeon's office to make them aware.

## 2023-10-28 NOTE — Progress Notes (Signed)
[facility] Return Visit  SUBJECTIVE  History of Present Illness: [name] is a 67 y.o. female seen in follow-up for prolapse and incontinence  She has a #7 gellhorn pessary. This was upsized last visit from a #5. The new pessary is working well and has not fallen out. Denies vaginal bleeding. Plan was to check pessary today from placement last month but she is requesting to leave in place a little longer.  She has leakage as soon as she wakes up in the morning. Sometimes she has no sensation as soon as she gets to the bathroom and it just comes out.  Avoids caffeine or soda use.   Urodynamic Impression (01/2022):  1. Sensation was normal; capacity was normal 2. Stress Incontinence was demonstrated. 3.  Emptying was normal with a normal PVR.(58ml with reduction of prolapse)   Past Medical History: Patient  has a past medical history of Acute urinary retention s/p Foley (01/07/2012), Allergy, Anemia, Asthma, CAD (coronary artery disease), Carotid arterial disease (HCC), Carpal tunnel syndrome, bilateral, GERD (gastroesophageal reflux disease), Hemorrhoids, internal, with bleeding & prolapse (12/13/2011), Hernia, abdominal, History of blood transfusion, History of kidney stones, Hyperlipidemia, Hypertension, Iron deficiency (12/07/2011), Keloid scar, Morbid obesity (HCC), Myocardial infarct (HCC), Neuropathy, Osteoarthritis, Osteoporosis, PONV (postoperative nausea and vomiting), Seasonal allergies, Sleep apnea, Tooth abscess, and Uterine cancer (HCC) (05/2014).   Past Surgical History: She  has a past surgical history that includes Knee surgery (Bilateral, 1999); Total knee arthroplasty (Left, 04/29/2011); Wisdom tooth extraction; Colonoscopy (2009); Knee arthroscopy (1991); transanal hemorrhoidal dearterliaization (01/06/2012); Coronary artery bypass graft (N/A, 01/09/2014); Intraoprative transesophageal echocardiogram (N/A, 01/09/2014); TEE without cardioversion (01/2010); NM  MYOCAR PERF WALL MOTION (01/2010); Cardiac catheterization; Multiple extractions with alveoloplasty (N/A, 04/11/2014); Vaginal hysterectomy (N/A, 06/03/2014); Unilateral salpingectomy (Right, 06/03/2014); left heart catheterization with coronary angiogram (N/A, 01/04/2014); Breast reduction surgery (Bilateral, 10/10/2015); Colonoscopy w/ biopsies and polypectomy; Lesion excision with complex repair (N/A, 05/07/2016); Cataract extraction (Right); Artery Biopsy (Left, 07/10/2021); and bladder tack.   Medications: She has a current medication list which includes the following prescription(s): trospium chloride, albuterol, alum & mag hydroxide-simeth, amlodipine, amoxicillin-clavulanate, aspirin ec, vitamin d high potency, clobetasol propionate e, fluconazole, fluconazole, hydroxychloroquine, losartan, metoprolol tartrate, multivitamin with minerals, nystatin ointment, omeprazole, polyethyl glycol-propyl glycol, rosuvastatin, senna-docusate, and triamcinolone cream, and the following Facility-Administered Medications: sodium chloride.   Allergies: Patient is allergic to cephalosporins, eicosapentaenoic acid (epa) (fish), fish-derived products, peanuts [peanut oil], shrimp [shellfish allergy], latex, metronidazole, methotrexate derivatives, ciprofloxacin, lisinopril, naproxen, other, and prednisone.   Social History: Patient  reports that she has never smoked. She has never been exposed to tobacco smoke. She has never used smokeless tobacco. She reports that she does not drink alcohol and does not use drugs.      OBJECTIVE     Physical Exam: Vitals:   10/28/23 0837 10/28/23 0908  BP: (!) 176/82 (!) 185/89  Pulse: 66 65  Weight: 271 lb (122.9 kg)   Height: 5' 7.32" (1.71 m)    Gen: No apparent distress, A&O x 3.  Detailed Urogynecologic Evaluation:  Deferred. Prior exam showed:  POP-Q:    POP-Q   3                                            Aa   3  Ba   -7                                              C    7                                            Gh   3.5                                            Pb   9                                            tvl    -3                                            Ap   -3                                            Bp                                                  D        ASSESSMENT AND PLAN    [name] is a 67 y.o. with:  1. Overactive bladder   2. Vaginal vault prolapse after hysterectomy   3. SUI (stress urinary incontinence, female)     - Started trospium 60mg  ER daily for OAB symptoms. Will have her follow up in 6 weeks to reevaluate symptoms and for a pessary check.   Plan for surgery: Exam under anesthesia, anterior repair, sacrospinous ligament fixation, perineorrhaphy, urethral bulking, cystoscopy  - We reviewed the patient's specific anatomic and functional findings, with the assistance of diagrams, and together finalized the above procedure. The planned surgical procedures were discussed along with the surgical risks outlined below, which were also provided on a detailed handout. Additional treatment options including expectant management, conservative management, medical management were discussed where appropriate.  We reviewed the benefits and risks of each treatment option.  - She prefers to avoid mesh. We also discussed the option for colpocleisis as she is not sexually active but she prefers to leave the option open.   General Surgical Risks: For all procedures, there are risks of bleeding, infection, damage to surrounding organs including but not limited to bowel, bladder, blood vessels, ureters and nerves, and need for further surgery if an injury were to occur. These risks are all low with minimally invasive surgery.   There are risks of numbness and weakness at any body site or buttock/rectal pain.  It is possible that baseline pain can be worsened by surgery,  either with  or without mesh. If surgery is vaginal, there is also a low risk of possible conversion to laparoscopy or open abdominal incision where indicated. Very rare risks include blood transfusion, blood clot, heart attack, pneumonia, or death.   There is also a risk of short-term postoperative urinary retention with need to use a catheter. About half of patients need to go home from surgery with a catheter, which is then later removed in the office. The risk of long-term need for a catheter is very low. There is also a risk of worsening of overactive bladder.   Urethral bulking:  We discussed success rate of approximately 70-80% and possible need for second injection. We reviewed that this is not a permanent procedure and the Bulkamid does dissolve over time. Risks reviewed including injury to bladder or urethra, UTI, urinary retention and hematuria.   Prolapse (with or without mesh): Risk factors for surgical failure  include things that put pressure on your pelvis and the surgical repair, including obesity, chronic cough, and heavy lifting or straining (including lifting children or adults, straining on the toilet, or lifting heavy objects such as furniture or anything weighing >25 lbs. Risks of recurrence is 20-30% with vaginal native tissue repair and a less than 10% with sacrocolpopexy with mesh.     - For preop Visit:  She is required to have a visit within 30 days of her surgery.   - Medical clearance: required Letter sent to Dr Rennis Golden requesting risk stratification and medical optimization. - Anticoagulant use: No - Medicaid Hysterectomy form: No - Accepts blood transfusion: Yes - Expected length of stay: outpatient  Request sent for surgery scheduling.   Will have her return 6 weeks for pessary check and to review new medication  Marguerita Beards, MD

## 2023-10-28 NOTE — Telephone Encounter (Signed)
   Pre-operative Risk Assessment    Patient Name: KARMESHA FLIPPO  DOB: Aug 05, 1956 MRN: [id number]      Request for Surgical Clearance    Procedure:   PELVIC ORGAN PROLAPSE & STRESS INCONTINENCE   Date of Surgery:  Clearance TBD                                 Surgeon:  Lanetta Inch, MD Surgeon's Group or Practice Name:  [facility] Phone number:  [phone] Fax number:  [phone]   Type of Clearance Requested:   - Medical  - Pharmacy:  Hold Aspirin NOT INDICATED HOW LONG   Type of Anesthesia:  General    Additional requests/questions:    Wilhemina Cash   10/28/2023, 1:01 PM

## 2023-10-28 NOTE — Telephone Encounter (Signed)
   Name: [name]  DOB: [birth year]  MRN: [id number]  Primary Cardiologist: Chrystie Nose, MD  Chart reviewed as part of pre-operative protocol coverage. Because of [name]'s past medical history and time since last visit, she will require a follow-up in-office visit in order to better assess preoperative cardiovascular risk.  Pre-op covering staff: - Please schedule appointment and call patient to inform them. If patient already had an upcoming appointment within acceptable timeframe, please add "pre-op clearance" to the appointment notes so provider is aware. - Please contact requesting surgeon's office via preferred method (i.e, phone, fax) to inform them of need for appointment prior to surgery.  [name] Sohaib Vereen, PA  10/28/2023, 1:07 PM

## 2023-10-31 ENCOUNTER — Other Ambulatory Visit: Payer: Self-pay | Admitting: Obstetrics and Gynecology

## 2023-10-31 MED ORDER — VIBEGRON 75 MG PO TABS: 75.0000 mg | ORAL_TABLET | Freq: Every day | ORAL | 5 refills | Status: DC

## 2023-10-31 NOTE — Progress Notes (Signed)
Changed from Trospium to [facility] due to lack of coverage for Trospium. Will see if Leslye Peer is covered.

## 2023-11-07 ENCOUNTER — Telehealth: Payer: Self-pay

## 2023-11-07 NOTE — Telephone Encounter (Signed)
RTC from patient having congestion, runny nose, eye pain.  Chest congestion.  Cough-yellow thick.  Itchy eyes, chills.  Has not taken a Covid Test.  States had before and this does not feel like that.Was very weak yesterday but feeling stronger today.  Has been taking Coricidin for folks with high blood pressure. Lemon tea and Tumeric.  Wants to know what else she can d.  Stated that a lot of people  at her [name] have the same thing.

## 2023-11-07 NOTE — Telephone Encounter (Signed)
Pt states she is not feeling well since last Thursday 12/14, requesting an telehealth appt. No open appt for this week. Please call pt back.

## 2023-11-07 NOTE — Telephone Encounter (Signed)
Thank you. Sounds like a viral URI. I think supportive care is great for now. Tylenol and a short course of ibuprofen may also be helpful. If she develops fevers, or worsening productive cough, we can see her in clinic in case this turns into pneumonia.

## 2023-11-08 NOTE — Telephone Encounter (Signed)
Call to patient.  States is feeling a little better.  Cough is not as bad.  States had been around her daughter as well who has the Flu. Her symptoms are not as bad as her's and she was given a Z-Pak..   Advised that Tylenol and Ibuprofen will help with some of her issues as well.  Asked to call back by Thursday if not symptoms continue or worsen. Patient voiced understanding of and will call for an appointment.

## 2023-12-05 ENCOUNTER — Encounter: Payer: 59 | Admitting: Student in an Organized Health Care Education/Training Program

## 2023-12-05 DIAGNOSIS — Z961 Presence of intraocular lens: Secondary | ICD-10-CM | POA: Diagnosis not present

## 2023-12-05 DIAGNOSIS — H25812 Combined forms of age-related cataract, left eye: Secondary | ICD-10-CM | POA: Diagnosis not present

## 2023-12-05 DIAGNOSIS — H04123 Dry eye syndrome of bilateral lacrimal glands: Secondary | ICD-10-CM | POA: Diagnosis not present

## 2023-12-05 DIAGNOSIS — H43823 Vitreomacular adhesion, bilateral: Secondary | ICD-10-CM | POA: Diagnosis not present

## 2023-12-05 DIAGNOSIS — H53021 Refractive amblyopia, right eye: Secondary | ICD-10-CM | POA: Diagnosis not present

## 2023-12-07 ENCOUNTER — Other Ambulatory Visit: Payer: Self-pay

## 2023-12-07 ENCOUNTER — Emergency Department (HOSPITAL_COMMUNITY)
Admission: EM | Admit: 2023-12-07 | Discharge: 2023-12-08 | Disposition: A | Discharge status: Home / Self Care | Payer: 59 | Attending: Emergency Medicine | Admitting: Emergency Medicine

## 2023-12-07 ENCOUNTER — Emergency Department (HOSPITAL_COMMUNITY): Payer: 59

## 2023-12-07 ENCOUNTER — Encounter (HOSPITAL_COMMUNITY): Payer: Self-pay | Admitting: *Deleted

## 2023-12-07 DIAGNOSIS — Z79899 Other long term (current) drug therapy: Secondary | ICD-10-CM | POA: Diagnosis not present

## 2023-12-07 DIAGNOSIS — I471 Supraventricular tachycardia, unspecified: Secondary | ICD-10-CM | POA: Insufficient documentation

## 2023-12-07 DIAGNOSIS — R404 Transient alteration of awareness: Secondary | ICD-10-CM | POA: Diagnosis not present

## 2023-12-07 DIAGNOSIS — Z91010 Allergy to peanuts: Secondary | ICD-10-CM | POA: Insufficient documentation

## 2023-12-07 DIAGNOSIS — Z951 Presence of aortocoronary bypass graft: Secondary | ICD-10-CM | POA: Insufficient documentation

## 2023-12-07 DIAGNOSIS — I10 Essential (primary) hypertension: Secondary | ICD-10-CM | POA: Diagnosis not present

## 2023-12-07 DIAGNOSIS — I25718 Atherosclerosis of autologous vein coronary artery bypass graft(s) with other forms of angina pectoris: Secondary | ICD-10-CM | POA: Insufficient documentation

## 2023-12-07 DIAGNOSIS — I700 Atherosclerosis of aorta: Secondary | ICD-10-CM | POA: Diagnosis not present

## 2023-12-07 DIAGNOSIS — Z7982 Long term (current) use of aspirin: Secondary | ICD-10-CM | POA: Insufficient documentation

## 2023-12-07 DIAGNOSIS — Z91040 Latex allergy status: Secondary | ICD-10-CM | POA: Insufficient documentation

## 2023-12-07 DIAGNOSIS — J45909 Unspecified asthma, uncomplicated: Secondary | ICD-10-CM | POA: Diagnosis not present

## 2023-12-07 DIAGNOSIS — Z743 Need for continuous supervision: Secondary | ICD-10-CM | POA: Diagnosis not present

## 2023-12-07 DIAGNOSIS — R6889 Other general symptoms and signs: Secondary | ICD-10-CM | POA: Diagnosis not present

## 2023-12-07 DIAGNOSIS — R059 Cough, unspecified: Secondary | ICD-10-CM | POA: Diagnosis not present

## 2023-12-07 DIAGNOSIS — R000 Tachycardia, unspecified: Secondary | ICD-10-CM | POA: Diagnosis not present

## 2023-12-07 DIAGNOSIS — I499 Cardiac arrhythmia, unspecified: Secondary | ICD-10-CM | POA: Diagnosis not present

## 2023-12-07 NOTE — ED Triage Notes (Signed)
 Pt arrived with EMS from a birthday party, felt palpitations, pulse 170-180, bp 180/110. EMS ambulated pt outside and pt converted when hitting cold air. Pt reports missing two doses of metoprolol  on the past two nights.

## 2023-12-08 LAB — BASIC METABOLIC PANEL
Anion gap: 9 (ref 5–15)
BUN: 13 mg/dL (ref 8–23)
CO2: 24 mmol/L (ref 22–32)
Calcium: 9.3 mg/dL (ref 8.9–10.3)
Chloride: 104 mmol/L (ref 98–111)
Creatinine, Ser: 0.8 mg/dL (ref 0.44–1.00)
GFR, Estimated: >60 mL/min (ref >60)
Glucose, Bld: 84 mg/dL (ref 70–99)
Potassium: 3.8 mmol/L (ref 3.5–5.1)
Sodium: 137 mmol/L (ref 135–145)

## 2023-12-08 LAB — CBC
HCT: 43.6 % (ref 36.0–46.0)
Hemoglobin: 14.6 g/dL (ref 12.0–15.0)
MCH: 28.6 pg (ref 26.0–34.0)
MCHC: 33.5 g/dL (ref 30.0–36.0)
MCV: 85.5 fL (ref 80.0–100.0)
Platelets: 253 10*3/uL (ref 150–400)
RBC: 5.1 MIL/uL (ref 3.87–5.11)
RDW: 15 % (ref 11.5–15.5)
WBC: 8.4 10*3/uL (ref 4.0–10.5)
nRBC: 0 % (ref 0.0–0.2)

## 2023-12-08 NOTE — Discharge Instructions (Addendum)
All your blood work and x-ray look normal today.  Take all your regular medications.  Return if you start getting chest pain, palpitations or fainting.

## 2023-12-08 NOTE — ED Provider Notes (Signed)
[facility] AT [facility] Provider Note   CSN: [id number] Arrival date & time: 12/07/23  2308     History  Chief Complaint  Patient presents with   Tachycardia    [name] is a 68 y.o. female.  Pt is a 67y/o female with hx of CAD s/p CABG 10 years ago, Asthma, GERD, hyperlipidemia, HTN presenting with palpitations.  Pt states she had URI sx this past month which was finally starting to get better but went to a birthday party last night and developed palpitations that made her feel a little dizzy but resolved after a few min.  After she got home they started again and were not going away so she called 911.  When EMS arrived pt had a HR of 170-180 and pt was hypertensive.  She felt dizzy but denies SOB, chest pain or prior hx of frequent palpitations.  She did forget to take her metoprolol last night and reports she does at times forget to take it at night but does have it.  EMS states when pt got outside she converted from SVT.  Pt reports feeling fine now.  The history is provided by the patient and medical records.       Home Medications Prior to Admission medications   Medication Sig Start Date End Date Taking? Authorizing Provider  albuterol (VENTOLIN HFA) 108 (90 Base) MCG/ACT inhaler Inhale 2 puffs into the lungs every 6 (six) hours as needed for wheezing or shortness of breath. 12/28/22   Tyson Alias, MD  alum & mag hydroxide-simeth (MAALOX PLUS) 400-400-40 MG/5ML suspension Take 15 mLs by mouth every 6 (six) hours as needed for indigestion. 11/03/22   Mesner, Barbara Cower, MD  amLODipine (NORVASC) 5 MG tablet Take 1 tablet (5 mg total) by mouth daily. 01/10/23 01/10/24  Tyson Alias, MD  amoxicillin-clavulanate (AUGMENTIN) 875-125 MG tablet Take 1 tablet by mouth 2 (two) times daily. 04/17/23   [provider]  aspirin EC 81 MG tablet Take 1 tablet (81 mg total) by mouth daily. Swallow whole. 01/10/23 01/10/24  Tyson Alias, MD  Cholecalciferol (VITAMIN D HIGH POTENCY) 25 MCG (1000 UT) capsule Take 1,000 Units by mouth daily.    [provider]  Clobetasol Prop Emollient Base (CLOBETASOL PROPIONATE E) 0.05 % emollient cream Apply 1 Application topically at bedtime. 06/22/23   Selmer Dominion, NP  fluconazole (DIFLUCAN) 150 MG tablet Take 1 tablet (150 mg total) by mouth daily. 06/22/23   Selmer Dominion, NP  fluconazole (DIFLUCAN) 150 MG tablet Take 1 tablet (150 mg total) by mouth at bedtime. Repeat in 72 hours if symptoms are not completely resolved. 09/30/23   Selmer Dominion, NP  hydroxychloroquine (PLAQUENIL) 200 MG tablet TAKE 1 TABLET BY MOUTH EVERY DAY 07/13/23   Rice, Jamesetta Orleans, MD  losartan (COZAAR) 100 MG tablet TAKE 1 TABLET BY MOUTH EVERY DAY 03/14/23   Tyson Alias, MD  metoprolol tartrate (LOPRESSOR) 25 MG tablet TAKE 1 AND 1/2 TABLETS BY MOUTH TWICE A DAY 12/09/22   Hilty, Lisette Abu, MD  Multiple Vitamin (MULTIVITAMIN WITH MINERALS) TABS tablet Take 1 tablet by mouth every Monday, Wednesday, and Friday.    [provider]  nystatin ointment (MYCOSTATIN) Apply 1 Application topically 2 (two) times daily. Apply to affected area for up to 7 days for skin irritation 09/30/23   Selmer Dominion, NP  omeprazole (PRILOSEC) 20 MG capsule TAKE 1 CAPSULE BY MOUTH EVERY DAY BEFORE  BREAKFAST 07/13/23   Meredith Pel, NP  Polyethyl Glycol-Propyl Glycol (SYSTANE FREE OP) Apply 1 tablet to eye daily as needed (For dry eyes).    [provider]  rosuvastatin (CRESTOR) 20 MG tablet Take 1 tablet (20 mg total) by mouth daily. 01/10/23   Tyson Alias, MD  senna-docusate (SENOKOT-S) 8.6-50 MG tablet Take by mouth. 07/21/14   [provider]  triamcinolone cream (KENALOG) 0.1 % Apply 1 Application topically 2 (two) times daily. 05/11/23   Selmer Dominion, NP  Vibegron 75 MG TABS Take 1 tablet (75 mg total) by mouth daily. 10/31/23   Selmer Dominion, NP       Allergies    Cephalosporins, Eicosapentaenoic acid (epa) (fish), Fish-derived products, Peanuts [peanut oil], Shrimp [shellfish allergy], Latex, Metronidazole, Methotrexate derivatives, Ciprofloxacin, Lisinopril, Naproxen, Other, and Prednisone    Review of Systems   Review of Systems  Physical Exam Updated Vital Signs BP (!) 157/74 (BP Location: Right Arm)   Pulse 72   Temp 99 F (37.2 C)   Resp 18   LMP 01/13/2014 Comment: spotting in Feb and bleeding since April  SpO2 99%  Physical Exam Vitals and nursing note reviewed.  Constitutional:      General: She is not in acute distress.    Appearance: She is well-developed.  HENT:     Head: Normocephalic and atraumatic.  Eyes:     Pupils: Pupils are equal, round, and reactive to light.  Cardiovascular:     Rate and Rhythm: Normal rate and regular rhythm.     Heart sounds: Normal heart sounds. No murmur heard.    No friction rub.  Pulmonary:     Effort: Pulmonary effort is normal.     Breath sounds: Normal breath sounds. No wheezing or rales.     Comments: Well healed midline sternotomy scar Abdominal:     General: Bowel sounds are normal. There is no distension.     Palpations: Abdomen is soft.     Tenderness: There is no abdominal tenderness. There is no guarding or rebound.  Musculoskeletal:        General: No tenderness. Normal range of motion.     Comments: No edema  Skin:    General: Skin is warm and dry.     Findings: No rash.  Neurological:     Mental Status: She is alert and oriented to person, place, and time. Mental status is at baseline.     Cranial Nerves: No cranial nerve deficit.  Psychiatric:        Behavior: Behavior normal.     ED Results / Procedures / Treatments   Labs (all labs ordered are listed, but only abnormal results are displayed) Labs Reviewed  BASIC METABOLIC PANEL  CBC    EKG EKG Interpretation Date/Time:  Wednesday December 07 2023 23:23:02 EST Ventricular Rate:  104 PR  Interval:  188 QRS Duration:  80 QT Interval:  364 QTC Calculation: 478 R Axis:   32  Text Interpretation: Sinus tachycardia Cannot rule out Anterior infarct , age undetermined Abnormal ECG Confirmed by Tilden Fossa [phone]) on 12/08/2023 3:42:22 AM  Radiology DG Chest 2 View Result Date: 12/07/2023 CLINICAL DATA:  Cough and tachycardia EXAM: CHEST - 2 VIEW COMPARISON:  11/02/2022 FINDINGS: Post sternotomy changes. No focal opacity or pleural effusion. Stable cardiomediastinal silhouette with aortic atherosclerosis. IMPRESSION: No active cardiopulmonary disease. Electronically Signed   By: Jasmine Pang M.D.   On: 12/07/2023 23:49    Procedures Procedures  Medications Ordered in ED Medications - No data to display  ED Course/ Medical Decision Making/ A&P                                 Medical Decision Making Amount and/or Complexity of Data Reviewed Independent Historian: EMS External Data Reviewed: notes. Labs: ordered. Decision-making details documented in ED Course. Radiology: ordered and independent interpretation performed. Decision-making details documented in ED Course. ECG/medicine tests: ordered and independent interpretation performed. Decision-making details documented in ED Course.   Pt with multiple medical problems and comorbidities and presenting today with a complaint that caries a high risk for morbidity and mortality.  Here today after palpitations.  Pt found to be in SVT by EMS.  Converted prior to meds.  Has bee in the waiting room all night but remains in sinus rhythm at this time.  Well appearing and no c/o.  Pt has missed some doses of metoprolol but o/w compliant.  No signs of infection or other issues today.  I independently interpreted pt's EKG and labs.  EKG with sinus rhythm.  CBC, BMP wnl.  I have independently visualized and interpreted pt's images today.  CXR wnl. Will d/c home today with pt to continue home meds.  Will send ambulatory referral to  cardiology.        Final Clinical Impression(s) / ED Diagnoses Final diagnoses:  SVT (supraventricular tachycardia) [facility])    Rx / DC Orders ED Discharge Orders          Ordered    Ambulatory referral to Cardiology        12/08/23 0931              Gwyneth Sprout, MD 12/08/23 [phone]

## 2023-12-09 ENCOUNTER — Ambulatory Visit: Payer: 59 | Admitting: Obstetrics and Gynecology

## 2023-12-14 ENCOUNTER — Telehealth: Payer: Self-pay

## 2023-12-14 NOTE — Progress Notes (Signed)
Transition Care Management Follow-up Telephone Call Date of discharge and from where: 12/08/2023 [facility] How have you been since you were released from the hospital? Patient stated she is feeling better but still feeling fatigued. Any questions or concerns? No  Items Reviewed: Did the pt receive and understand the discharge instructions provided? Yes  Medications obtained and verified?  No medication prescribed. Other? No  Any new allergies since your discharge? No  Dietary orders reviewed? Yes Do you have support at home? Yes   Follow up appointments reviewed:  PCP Hospital f/u appt confirmed? Yes  Scheduled to see Modena Slater, DO on 12/16/2023 @ [facility]. Specialist Hospital f/u appt confirmed? Yes  Scheduled to see Zoila Shutter, MD on 01/06/2024 @ [facility] at [facility]. Are transportation arrangements needed? No  If their condition worsens, is the pt aware to call PCP or go to the Emergency Dept.? Yes Was the patient provided with contact information for the PCP's office or ED? Yes Was to pt encouraged to call back with questions or concerns? Yes   Neshawn Aird [facility]  [facility] Guide Direct Dial: [phone]  Fax: [phone] Website: [email or web address]

## 2023-12-16 ENCOUNTER — Encounter: Payer: Self-pay | Admitting: Student

## 2023-12-16 ENCOUNTER — Ambulatory Visit (INDEPENDENT_AMBULATORY_CARE_PROVIDER_SITE_OTHER): Payer: 59 | Admitting: Student

## 2023-12-16 VITALS — BP 164/72 | HR 50 | Temp 98.4°F | Wt 278.3 lb

## 2023-12-16 DIAGNOSIS — I4719 Other supraventricular tachycardia: Secondary | ICD-10-CM

## 2023-12-16 DIAGNOSIS — I10 Essential (primary) hypertension: Secondary | ICD-10-CM

## 2023-12-16 DIAGNOSIS — R7302 Impaired glucose tolerance (oral): Secondary | ICD-10-CM | POA: Diagnosis not present

## 2023-12-16 DIAGNOSIS — E559 Vitamin D deficiency, unspecified: Secondary | ICD-10-CM | POA: Diagnosis not present

## 2023-12-16 DIAGNOSIS — E785 Hyperlipidemia, unspecified: Secondary | ICD-10-CM

## 2023-12-16 MED ORDER — AMLODIPINE BESYLATE 5 MG PO TABS: 5.0000 mg | ORAL_TABLET | Freq: Every day | ORAL | 3 refills | Status: DC

## 2023-12-16 MED ORDER — LOSARTAN POTASSIUM 100 MG PO TABS: 100.0000 mg | ORAL_TABLET | Freq: Every day | ORAL | 3 refills | Status: DC

## 2023-12-16 MED ORDER — ROSUVASTATIN CALCIUM 20 MG PO TABS: 20.0000 mg | ORAL_TABLET | Freq: Every day | ORAL | 3 refills | Status: DC

## 2023-12-16 NOTE — Assessment & Plan Note (Signed)
Patient has elevated glucose levels in the past which showed on BMP. Will check A1c today.  Plan: -Follow up A1c -encouraged lifestyle modifications

## 2023-12-16 NOTE — Assessment & Plan Note (Signed)
Previously has had low vitamin D levels.  Plan: -Recheck vitamin D today

## 2023-12-16 NOTE — Progress Notes (Signed)
CC: Hypertension Follow up and emergency department visit follow up   HPI:  [name] is a 68 y.o. female with a past medical history of hypertension, GERD, osteoarthritis who presents for follow-up appointment.  Please see assessment and plan for full HPI.  Medications: Hypertension: Amlodipine 5 mg daily, losartan 100 mg daily, metoprolol tartrate 37.5 mg twice daily CAD: Aspirin 81 mg daily, Crestor 20 mg daily GERD: Omeprazole 20 mg daily Constipation: Senna Incontinence Vibegron 75 mg daily  Patient was recently seen in the ED on 01/16 with SVT.  Patient was referred to cardiology.  Improved without medications.  Patient was last seen in the clinic on May 27, 2023.  At that time patient had concerns about hearing loss.  Otherwise patient had elevated blood pressures in which patient was to start amlodipine, but patient never did.   Past Medical History:  Diagnosis Date   Acute urinary retention s/p Foley 01/07/2012   Allergy    Anemia    Asthma    CAD (coronary artery disease)    a. 12/2013 s/p CABG x 5 Dr. Maren Beach (LIMA to LAD, SVG to diagonal, SVG to OM1, SVG to OM2, SVG to PDA)   Carotid arterial disease (HCC)    a. 12/2013 Carotid U/S: 1-39% bilat ICA stenosis.   Carpal tunnel syndrome, bilateral    GERD (gastroesophageal reflux disease)    Hemorrhoids, internal, with bleeding & prolapse 12/13/2011   Hernia, abdominal    History of blood transfusion    CABG and hysterectomy   History of kidney stones    Hyperlipidemia    Hypertension    Iron deficiency 12/07/2011   Keloid scar    a. Sternal Keloid s/p CABG with ongoing pain.   Morbid obesity (HCC)    Myocardial infarct (HCC)    2015   Neuropathy    Osteoarthritis    Osteoporosis    PONV (postoperative nausea and vomiting)    Seasonal allergies    Sleep apnea    no CPAP machine; sleep study 02/2010 REM AHI 61.7/hr, total sleep REM 14.8/hr   Tooth abscess    Uterine cancer (HCC) 05/2014   clinical  stage IA grade 1 endometrioid endometrial cancer     Current Outpatient Medications:    albuterol (VENTOLIN HFA) 108 (90 Base) MCG/ACT inhaler, Inhale 2 puffs into the lungs every 6 (six) hours as needed for wheezing or shortness of breath., Disp: 1 each, Rfl: 0   alum & mag hydroxide-simeth (MAALOX PLUS) 400-400-40 MG/5ML suspension, Take 15 mLs by mouth every 6 (six) hours as needed for indigestion., Disp: 355 mL, Rfl: 0   amLODipine (NORVASC) 5 MG tablet, Take 1 tablet (5 mg total) by mouth daily., Disp: 90 tablet, Rfl: 3   amoxicillin-clavulanate (AUGMENTIN) 875-125 MG tablet, Take 1 tablet by mouth 2 (two) times daily., Disp: , Rfl:    aspirin EC 81 MG tablet, Take 1 tablet (81 mg total) by mouth daily. Swallow whole., Disp: 150 tablet, Rfl: 2   Cholecalciferol (VITAMIN D HIGH POTENCY) 25 MCG (1000 UT) capsule, Take 1,000 Units by mouth daily., Disp: , Rfl:    Clobetasol Prop Emollient Base (CLOBETASOL PROPIONATE E) 0.05 % emollient cream, Apply 1 Application topically at bedtime., Disp: 30 g, Rfl: 2   losartan (COZAAR) 100 MG tablet, Take 1 tablet (100 mg total) by mouth daily., Disp: 90 tablet, Rfl: 3   Multiple Vitamin (MULTIVITAMIN WITH MINERALS) TABS tablet, Take 1 tablet by mouth every Monday, Wednesday, and Friday.,  Disp: , Rfl:    nystatin ointment (MYCOSTATIN), Apply 1 Application topically 2 (two) times daily. Apply to affected area for up to 7 days for skin irritation, Disp: 30 g, Rfl: 0   omeprazole (PRILOSEC) 20 MG capsule, TAKE 1 CAPSULE BY MOUTH EVERY DAY BEFORE BREAKFAST, Disp: 90 capsule, Rfl: 1   Polyethyl Glycol-Propyl Glycol (SYSTANE FREE OP), Apply 1 tablet to eye daily as needed (For dry eyes)., Disp: , Rfl:    rosuvastatin (CRESTOR) 20 MG tablet, Take 1 tablet (20 mg total) by mouth daily., Disp: 90 tablet, Rfl: 3   senna-docusate (SENOKOT-S) 8.6-50 MG tablet, Take by mouth., Disp: , Rfl:    Vibegron 75 MG TABS, Take 1 tablet (75 mg total) by mouth daily., Disp: 30  tablet, Rfl: 5   Vitamin D, Ergocalciferol, (DRISDOL) 1.25 MG (50000 UNIT) CAPS capsule, Take 1 capsule (50,000 Units total) by mouth every 7 (seven) days., Disp: 4 capsule, Rfl: 1  Review of Systems:    Negative Except what is stated in HPI   Physical Exam:  Vitals:   12/16/23 0929 12/16/23 0948  BP: (!) 177/86 (!) 164/72  Pulse: (!) 56 (!) 50  Temp: 98.4 F (36.9 C)   TempSrc: Oral   Weight: 278 lb 4.8 oz (126.2 kg)    General: Patient is sitting comfortably in the room  Neck: No thyroid enlargement or nodules appreciated  Cardio: Bradycardic, regular rhythm, no murmurs, rubs or gallops Pulmonary: Clear to ausculation bilaterally with no rales, rhonchi, and crackles   Assessment & Plan:   Essential hypertension Patient has a past medical history of hypertension. Her current medication regimen is losartan 100 mg daily, metoprolol tartrate 37.5 mg daily, and amlodipine 5 mg daily. She states that she has been taking her losartan and her metoprolol but has not been taking her amlodipine. She was unsure about what medicine this is and did not know that she was to be on it. She has elevated blood pressures today with systolic into the 160s-170s. She denies any headaches, vision changes, shortness of breath, or chest pain. She does check her pressures at home and they have been elevated into the 150s systolic. I recommended her to start amlodipine back as this can help but given her brady cardia today into the 50s, I had her hold her metoprolol until she can get to her cardiologist on 02/14. Of note patient has tried hydrochlorothiazide in the past and did not tolerate this well.   Plan: -Continue Losartan 100 mg daily  -Hold metoprolol  -Patient was encouraged to start her amlodipine 5 mg daily. -Follow up with blood pressure log at cardiologist office  -Recent BMP showing normal kidney function and normal electroyltes, so will not check BMP today   Glucose intolerance (impaired  glucose tolerance) Patient has elevated glucose levels in the past which showed on BMP. Will check A1c today.  Plan: -Follow up A1c -encouraged lifestyle modifications   Vitamin D deficiency Previously has had low vitamin D levels.  Plan: -Recheck vitamin D today   Hyperlipidemia Patient endorses that she has not been taking her Rosuvastatin. Had been about a year and half since patient has had her lipids checked. Will recheck today.  Plan: -Refilled Crestor 20 mg daily  -Follow up Lipid panel   Other supraventricular tachycardia (HCC) Patient was seen about 1 week ago in the emergency department with concerns of SVT. Patient reports that she was at a birthday party and felt her heart racing and felt that  she was going to pass out. She states it resolved and then she went home. This episode happened again at home and then she called EMS. Patient was taken to the ED in which initial EKG showed initial rate of 188. She converted without any medications. Patient's CBC and BMP were checked and were normal. CXR at the time were normal too. Since, patient has been feeling great and has no concerns about symptoms since. Interestingly enough, today patient was bradycardic. Held her metoprolol today.  Plan: -Check TSH -Follow up with cardiology -Hold metroprol   Patient discussed with Dr. Marquita Palms, DO PGY-2 Internal Medicine Resident  Pager: [phone]

## 2023-12-16 NOTE — Assessment & Plan Note (Signed)
Patient endorses that she has not been taking her Rosuvastatin. Had been about a year and half since patient has had her lipids checked. Will recheck today.  Plan: -Refilled Crestor 20 mg daily  -Follow up Lipid panel

## 2023-12-16 NOTE — Assessment & Plan Note (Signed)
Patient was seen about 1 week ago in the emergency department with concerns of SVT. Patient reports that she was at a birthday party and felt her heart racing and felt that she was going to pass out. She states it resolved and then she went home. This episode happened again at home and then she called EMS. Patient was taken to the ED in which initial EKG showed initial rate of 188. She converted without any medications. Patient's CBC and BMP were checked and were normal. CXR at the time were normal too. Since, patient has been feeling great and has no concerns about symptoms since. Interestingly enough, today patient was bradycardic. Held her metoprolol today.  Plan: -Check TSH -Follow up with cardiology -Hold metroprol

## 2023-12-16 NOTE — Patient Instructions (Signed)
[name], Drotar you for allowing me to take part in your care today.  Here are your instructions.  1.  Keep a blood pressure log.  Please take amlodipine 5 mg daily, and losartan 100 mg daily.  Stop taking your metoprolol as your heart rate is very low.  2.  I am checking your vitamin D level, cholesterol levels, and checking your thyroid levels.  I will call you with the results.  I am also checking your A1c.  I will call you with results.  3.  Please follow-up in 3 months.  4.  Please do not miss your appointment with your cardiologist on February 14.  Thank you, Dr. Allena Katz  If you have any other questions please contact the internal medicine clinic at [phone] If it is after hours, please call the [place] [facility] at [phone] and then ask the person who picks up for the resident on call.

## 2023-12-16 NOTE — Assessment & Plan Note (Addendum)
Patient has a past medical history of hypertension. Her current medication regimen is losartan 100 mg daily, metoprolol tartrate 37.5 mg daily, and amlodipine 5 mg daily. She states that she has been taking her losartan and her metoprolol but has not been taking her amlodipine. She was unsure about what medicine this is and did not know that she was to be on it. She has elevated blood pressures today with systolic into the 160s-170s. She denies any headaches, vision changes, shortness of breath, or chest pain. She does check her pressures at home and they have been elevated into the 150s systolic. I recommended her to start amlodipine back as this can help but given her brady cardia today into the 50s, I had her hold her metoprolol until she can get to her cardiologist on 02/14. Of note patient has tried hydrochlorothiazide in the past and did not tolerate this well.   Plan: -Continue Losartan 100 mg daily  -Hold metoprolol  -Start amlodipine 5 mg daily  -Follow up with blood pressure log at cardiologist office  -Recent BMP showing normal kidney function and normal electroyltes, so will not check BMP today

## 2023-12-17 LAB — LIPID PANEL
Chol/HDL Ratio: 3.5 {ratio} (ref 0.0–4.4)
Cholesterol, Total: 193 mg/dL (ref 100–199)
HDL: 55 mg/dL (ref >39)
LDL Chol Calc (NIH): 122 mg/dL — ABNORMAL HIGH (ref 0–99)
Triglycerides: 88 mg/dL (ref 0–149)
VLDL Cholesterol Cal: 16 mg/dL (ref 5–40)

## 2023-12-17 LAB — TSH RFX ON ABNORMAL TO FREE T4: TSH: 1.48 u[IU]/mL (ref 0.450–4.500)

## 2023-12-17 LAB — HEMOGLOBIN A1C
Est. average glucose Bld gHb Est-mCnc: 94 mg/dL
Hgb A1c MFr Bld: 4.9 % (ref 4.8–5.6)

## 2023-12-17 LAB — VITAMIN D 25 HYDROXY (VIT D DEFICIENCY, FRACTURES): Vit D, 25-Hydroxy: 9.4 ng/mL — ABNORMAL LOW (ref 30.0–100.0)

## 2023-12-19 ENCOUNTER — Telehealth: Payer: Self-pay | Admitting: Student

## 2023-12-19 ENCOUNTER — Encounter: Payer: Self-pay | Admitting: Student

## 2023-12-19 MED ORDER — VITAMIN D (ERGOCALCIFEROL) 1.25 MG (50000 UNIT) PO CAPS: 50000.0000 [IU] | ORAL_CAPSULE | ORAL | 1 refills | Status: DC

## 2023-12-19 NOTE — Telephone Encounter (Signed)
See result note, starting weekly vitamin D supplementation.

## 2023-12-22 ENCOUNTER — Encounter: Payer: Self-pay | Admitting: Student

## 2023-12-22 NOTE — Progress Notes (Signed)
Internal Medicine Clinic Attending  Case discussed with the resident at the time of the visit.  We reviewed the resident's history and exam and pertinent patient test results.  I agree with the assessment, diagnosis, and plan of care documented in the resident's note.

## 2024-01-06 ENCOUNTER — Encounter: Payer: Self-pay | Admitting: Internal Medicine

## 2024-01-06 ENCOUNTER — Ambulatory Visit: Payer: 59 | Attending: Internal Medicine | Admitting: Internal Medicine

## 2024-01-06 VITALS — BP 146/78 | HR 73 | Ht 68.0 in | Wt 274.0 lb

## 2024-01-06 DIAGNOSIS — I4719 Other supraventricular tachycardia: Secondary | ICD-10-CM | POA: Diagnosis not present

## 2024-01-06 DIAGNOSIS — Z01810 Encounter for preprocedural cardiovascular examination: Secondary | ICD-10-CM

## 2024-01-06 DIAGNOSIS — I10 Essential (primary) hypertension: Secondary | ICD-10-CM | POA: Diagnosis not present

## 2024-01-06 MED ORDER — METOPROLOL TARTRATE 25 MG PO TABS: 25.0000 mg | ORAL_TABLET | Freq: Two times a day (BID) | ORAL | 1 refills | Status: DC

## 2024-01-06 MED ORDER — VALSARTAN-HYDROCHLOROTHIAZIDE 320-12.5 MG PO TABS
1.0000 | ORAL_TABLET | Freq: Every day | ORAL | 1 refills | Status: DC
Start: 2024-01-06 — End: 2024-01-23

## 2024-01-06 NOTE — Patient Instructions (Signed)
Medication Instructions:   Stop: Amlodipine (Norvasc) Stop: Losartan (Cozaar)  Start:  Valsartan-hydrochlorothiazide (Diovan-HCT) 320-12.5 mg once Daily  Restart: Metoprolol tartrate (Lopressor) 25 mg two times daily   Lab Work: None  Testing/Procedures: Your physician has requested that you have an echocardiogram. Echocardiography is a painless test that uses sound waves to create images of your heart. It provides your doctor with information about the size and shape of your heart and how well your heart's chambers and valves are working. This procedure takes approximately one hour. There are no restrictions for this procedure. Please do NOT wear cologne, perfume, aftershave, or lotions (deodorant is allowed). Please arrive 15 minutes prior to your appointment time. This will take place at [address] [place]. [address]    Follow-Up: At [facility], you and your health needs are our priority.  As part of our continuing mission to provide you with exceptional heart care, we have created designated Provider Care Teams.  These Care Teams include your primary Cardiologist (physician) and Advanced Practice Providers (APPs -  Physician Assistants and Nurse Practitioners) who all work together to provide you with the care you need, when you need it.  Your next appointment:   3 month(s)  Provider:   Chrystie Nose, MD  or APP

## 2024-01-06 NOTE — Progress Notes (Signed)
 OFFICE NOTE  Chief Complaint:  Routine follow-up  Primary Care Physician: Tyson Alias, MD  HPI:  [name] is a 68 year old African American obese female presented to 01/01/14 with symptoms of unstable angina. I had seen her once in 2012, but not since then.  She recently had been having chest pain and presented urgently when she became short of breath at home and had chest tightness, which started as right arm pain and right chest pain, then moved substernally. She ultimately underwent cardiac catheterization via right radial artery which demonstrated severe three-vessel CAD with graftable target vessels. Echocardiogram showed good LV function with some left ventricular hypertrophy from her hypertension and there does not appear to be significant valve disease. Carotid dopplers showed a 1-39% bilateral carotid artery stenosis. She underwent a CABG x 5 on 01/09/2014. Left internal mammary artery to LAD, saphenous vein graft to diagonal, sequential saphenous vein graft to OM1 and OM2, saphenous vein graft to posterior descending. She had multiple teeth extractions prior to surgery. She did well post op. She was anemic but did not need transfusion. At discharge she went to SNF for rehab-she did not stay the recommended time frame due to BR not being available when she needed it. She was not given discharge medications when she left SNF. She saw Nada Boozer, FNP, in followup for her hospitalization and was restarted on some of her medications.   [name] was seen today in the office for follow-up. At her last office visit I cleared her for surgery and she underwent hysterectomy and has had resolution of her bleeding problems. She is now contemplating right knee surgery and also breast reduction surgery. She's in the interim seen in the hospital for palpitations. She reports those have improved with medication adjustment. She's decreased her Lasix now to every other day and I think she  can probably take it as needed. She denies any chest pain worsening shortness of breath. She is undergoing injections to reduce the size of her keloid scar.  I saw [name] back in the office today. She's been undergoing rehabilitation and really wants to continue it. She's managed to lose some weight and feels better. Recently though she's been having increased blood pressures. Does have ranged up to 180 systolic. She had a small increase in her lisinopril to 10 mg daily with an improvement however this staff does not want to continue her at rehabilitation until her blood pressure is better controlled. She denies any chest pain or palpitations.  [name] returns today for follow-up. She continues in cardiac rehabilitation and is doing well. At his last office visit I increased her lisinopril to 20 mg daily and her blood pressure is now much better controlled. Overall she is feeling well. She is undergoing cesarean injections of the keloid scar that she has in her bypass site. She is concerned about a dilated varicose vein that she's developed at the inferior margin.  Since I last saw her, she is doing well. She underwent breast reductions surgery, which has helped her CABG scar some, but has failed to lose significant weight. BMI is now over 50. She does get short of breath but not worsening. Blood pressure is well-controlled and cough has gone away after switching from ACE-I to ARB. She reports slightly more palpitations recently. She has episodes of palpitations, worse at night that occur 3-4x a month.  07/01/2016  [name] returns today for follow-up. She reports some left leg swelling. She has a  history of left knee surgery and has had swelling since that time. She occasionally gets some swelling in the right leg which is where her saphenous vein grafts were taken. She still reports numbness and tingling around the vein harvest sites. She has had numbness and tingling of the chest which is  exquisitely tender to palpation although she has a "high pain tolerance". She recently underwent revision of the keloid scar to her median sternotomy. That does appear somewhat improved. She denies any worsening shortness of breath. Weight is stable.  03/21/2017  [name] was seen today in follow-up. She recently saw Ward Givens, NP, for evaluation of chest pain. Given her history of coronary artery disease and prior CABG, he was concerned about recurrent ischemia. He ordered a [employer] which she underwent and it turns out was negative for ischemia. I do think a lot of her pain is related to her anterior chest keloid scar. She's also having problems with her knees which aren't about the importance of weight loss today. Recently she's lost a little bit away however she is still at a BMI around 50. Recently her blood pressure is running somewhat higher and her PCP increased her irbesartan from 150 mg to 300 mg daily.  04/13/2018  [name] was seen today in routine follow-up.  Over the past year she is done well.  She denies any further chest pain.  She had had an episode not long after bypass and underwent repeat stress testing which was negative for ischemia.  She continues to have problems with a keloid scar over her median sternotomy.  She plans to see a dermatologist about this.  Blood pressure is been fairly well controlled.  Unfortunately her weight is not decreased significantly.  She has been having problems with arthritis and uses BC powder.  I recommended discontinuing that.  She is not taking daily aspirin which she should be and will start that.  She also uses Aleve and I recommended using that sparingly.  She should consider Tylenol as an alternative.  She had an EKG today showed sinus bradycardia at 54 with very flat T waves and a questionable long QTC of over 650 ms.  On my evaluation, is very difficult to see where the T wave and.  I am not certain that there is actually QT  prolongation, and she is not actually on medications that would cause that.   04/07/2020  [name] returns today for follow-up.  Overall she seems to be doing well.  She has never arthritic issues and walks with a cane.  She is recently had some weight loss and intends to get down to 225 later this year.  She denies any chest pain or worsening shortness of breath.  Her blood pressure is elevated today however she saw her PCP this morning and it was much better controlled.  She says she switch to a plant-based diet.  04/02/2021  Ms. Cardella returns today for follow-up.  She continues to do well.  She is lost some additional weight.  She is sticking to a plant-based diet.  Blood pressure appears to be well controlled.  She still using a cane and having issues with her back and hips.  She wants to be more active.  I encouraged her to consider utilizing her membership at the [facility].  She is due for repeat lipid as her last cholesterol test was in 2021.  She denies any chest pain with exertion.  09/23/2022  Ms. Casebolt  returns today for follow-up.  Over the last year she has done well.  More recently her cholesterol had gone up about 8 months ago with total 202 and LDL 133.  She says she was off of her diet and has made some changes.  She is recently lost some more weight and her lipids show total cholesterol 147, triglycerides 89 HDL 53 and LDL 77.  She denies any chest pain or shortness of breath.  She has persistent discomfort over the keloid area of scar which has been an ongoing issue.  Blood pressure was a little elevated today but she says is better controlled at home.  She is compliant with her medications.  She denies any shortness of breath.  She is able to lay flat.  She denies any apneic symptoms.  01/08/2024  Ms. Booz is seen today in follow-up.  Overall she seems to be doing well although blood pressure is still not well-controlled.  Today 147/78.  She was switched to  amlodipine by her PCP however she says this is caused her issues with constipation.  She is ready on max dose of losartan.  She also has an upcoming surgery in April.  He had a Myoview back in 2018 which showed a mildly reduced LVEF of 49% but no ischemia.  She also recently presented the emergency department in January.  She had an episode of tachycardia which was likely an SVT.  Fortunately it abated.  She was previously on a beta-blocker, however that had been discontinued due to bradycardia.  PMHx:  Past Medical History:  Diagnosis Date   Acute urinary retention s/p Foley 01/07/2012   Allergy    Anemia    Asthma    CAD (coronary artery disease)    a. 12/2013 s/p CABG x 5 Dr. Maren Beach (LIMA to LAD, SVG to diagonal, SVG to OM1, SVG to OM2, SVG to PDA)   Carotid arterial disease (HCC)    a. 12/2013 Carotid U/S: 1-39% bilat ICA stenosis.   Carpal tunnel syndrome, bilateral    GERD (gastroesophageal reflux disease)    Hemorrhoids, internal, with bleeding & prolapse 12/13/2011   Hernia, abdominal    History of blood transfusion    CABG and hysterectomy   History of kidney stones    Hyperlipidemia    Hypertension    Iron deficiency 12/07/2011   Keloid scar    a. Sternal Keloid s/p CABG with ongoing pain.   Morbid obesity (HCC)    Myocardial infarct (HCC)    2015   Neuropathy    Osteoarthritis    Osteoporosis    PONV (postoperative nausea and vomiting)    Seasonal allergies    Sleep apnea    no CPAP machine; sleep study 02/2010 REM AHI 61.7/hr, total sleep REM 14.8/hr   Tooth abscess    Uterine cancer (HCC) 05/2014   clinical stage IA grade 1 endometrioid endometrial cancer    Past Surgical History:  Procedure Laterality Date   ARTERY BIOPSY Left 07/10/2021   Procedure: LEFT TEMPORAL ARTERY BIOPSY;  Surgeon: Nada Libman, MD;  Location: [facility] OR;  Service: Vascular;  Laterality: Left;   bladder tack     BREAST REDUCTION SURGERY Bilateral 10/10/2015   Procedure: BILATERAL  MAMMARY REDUCTION  (BREAST) WITH FREE NIPPLE GRAFT;  Surgeon: Glenna Fellows, MD;  Location: [facility] OR;  Service: Plastics;  Laterality: Bilateral;   CARDIAC CATHETERIZATION     CATARACT EXTRACTION Right    COLONOSCOPY  2009   COLONOSCOPY W/ BIOPSIES AND  POLYPECTOMY     CORONARY ARTERY BYPASS GRAFT N/A 01/09/2014   Procedure: CORONARY ARTERY BYPASS GRAFTING (CABG) x 5 using left internal mammary artery and right leg greater saphenous vein harvested endoscopically;  Surgeon: Kerin Perna, MD;  Location: [facility] OR;  Service: Open Heart Surgery;  Laterality: N/A;  please use bed extenders and breast binder   INTRAOPERATIVE TRANSESOPHAGEAL ECHOCARDIOGRAM N/A 01/09/2014   Procedure: INTRAOPERATIVE TRANSESOPHAGEAL ECHOCARDIOGRAM;  Surgeon: Kerin Perna, MD;  Location: [facility] OR;  Service: Open Heart Surgery;  Laterality: N/A;   KNEE ARTHROSCOPY  1991   KNEE SURGERY Bilateral 1999   LEFT HEART CATHETERIZATION WITH CORONARY ANGIOGRAM N/A 01/04/2014   Procedure: LEFT HEART CATHETERIZATION WITH CORONARY ANGIOGRAM;  Surgeon: Lesleigh Noe, MD;  Location: [facility] CATH LAB;  Service: Cardiovascular;  Laterality: N/A;   LESION EXCISION WITH COMPLEX REPAIR N/A 05/07/2016   Procedure: COMPLEX REPAIR OF CHEST 16 CM;  Surgeon: Glenna Fellows, MD;  Location: [facility] OR;  Service: Plastics;  Laterality: N/A;   MULTIPLE EXTRACTIONS WITH ALVEOLOPLASTY N/A 04/11/2014   Procedure: Extraction of tooth #'s 1,2,8,16 with alveoloplasty, maxillary tuberosity reductions, and gross debridement of remaining teeth.;  Surgeon: Charlynne Pander, DDS;  Location: [facility] OR;  Service: Oral Surgery;  Laterality: N/A;   NM MYOCAR PERF WALL MOTION  01/2010   dipyridamole myoview - moderate perfusion defect in basal inferoseptal, basal inferior, mid inferoseptal, mid inferior, apical inferior region; EF 56%   TEE WITHOUT CARDIOVERSION  01/2010   EF 60-65%, small, flat, non-infiltrating, calcified, fixed apical/septal mass   TOTAL KNEE ARTHROPLASTY  Left 04/29/2011   transanal hemorrhoidal dearterliaization  01/06/2012   with external hemorrhoid removal   UNILATERAL SALPINGECTOMY Right 06/03/2014   Procedure: UNILATERAL SALPINGECTOMY;  Surgeon: Willodean Rosenthal, MD;  Location: [facility] ORS;  Service: Gynecology;  Laterality: Right;   VAGINAL HYSTERECTOMY N/A 06/03/2014   Procedure: HYSTERECTOMY VAGINAL;  Surgeon: Willodean Rosenthal, MD;  Location: [facility] ORS;  Service: Gynecology;  Laterality: N/A;   WISDOM TOOTH EXTRACTION      FAMHx:  Family History  Problem Relation Age of Onset   Heart attack Mother 61   Heart disease Mother    Hypertension Mother    Diabetes Mother    Hypertension Sister    Heart failure Sister    Heart disease Sister    Diabetes Sister    Kidney disease Sister    Kidney disease Brother    Hypertension Daughter    Bipolar disorder Son    Schizophrenia Son    Anxiety disorder Son    Esophageal cancer Other        nephew   Colon cancer Neg Hx    Pancreatic cancer Neg Hx    Stomach cancer Neg Hx    Liver disease Neg Hx    Rectal cancer Neg Hx     SOCHx:   reports that she has never smoked. She has never been exposed to tobacco smoke. She has never used smokeless tobacco. She reports that she does not drink alcohol and does not use drugs.  ALLERGIES:  Allergies  Allergen Reactions   Cephalosporins Anaphylaxis, Swelling and Other (See Comments)    Tongue swelling, gum pain   Eicosapentaenoic Acid (Epa) (Fish) Anaphylaxis and Shortness Of Breath   Fish-Derived Products Anaphylaxis and Shortness Of Breath   Peanuts [Peanut Oil] Anaphylaxis, Shortness Of Breath and Other (See Comments)    Peanut butter   Shrimp [Shellfish Allergy] Anaphylaxis    Pt states her throat will swell  if she eats shrimp.   Latex Itching   Metronidazole Other (See Comments)    Palpitations, mild SOB, metallic taste, dry mouth, high blood pressure   Methotrexate Derivatives Nausea Only   Ciprofloxacin Other (See  Comments)    Gaging and achy   Lisinopril Cough        Naproxen Nausea Only and Other (See Comments)    Headache   Other Itching and Other (See Comments)     All pain meds make her itch - has to have something to prevent that in addition to receiving med   Prednisone Other (See Comments)    Heart beating fast Ok to take low dosage      ROS: Pertinent items noted in HPI and remainder of comprehensive ROS otherwise negative.  HOME MEDS: Current Outpatient Medications  Medication Sig Dispense Refill   albuterol (VENTOLIN HFA) 108 (90 Base) MCG/ACT inhaler Inhale 2 puffs into the lungs every 6 (six) hours as needed for wheezing or shortness of breath. 1 each 0   alum & mag hydroxide-simeth (MAALOX PLUS) 400-400-40 MG/5ML suspension Take 15 mLs by mouth every 6 (six) hours as needed for indigestion. 355 mL 0   amLODipine (NORVASC) 5 MG tablet Take 1 tablet (5 mg total) by mouth daily. 90 tablet 3   aspirin EC 81 MG tablet Take 1 tablet (81 mg total) by mouth daily. Swallow whole. 150 tablet 2   Clobetasol Prop Emollient Base (CLOBETASOL PROPIONATE E) 0.05 % emollient cream Apply 1 Application topically at bedtime. 30 g 2   losartan (COZAAR) 100 MG tablet Take 1 tablet (100 mg total) by mouth daily. 90 tablet 3   Multiple Vitamin (MULTIVITAMIN WITH MINERALS) TABS tablet Take 1 tablet by mouth every Monday, Wednesday, and Friday.     nystatin ointment (MYCOSTATIN) Apply 1 Application topically 2 (two) times daily. Apply to affected area for up to 7 days for skin irritation 30 g 0   omeprazole (PRILOSEC) 20 MG capsule TAKE 1 CAPSULE BY MOUTH EVERY DAY BEFORE BREAKFAST 90 capsule 1   Polyethyl Glycol-Propyl Glycol (SYSTANE FREE OP) Apply 1 tablet to eye daily as needed (For dry eyes).     rosuvastatin (CRESTOR) 20 MG tablet Take 1 tablet (20 mg total) by mouth daily. 90 tablet 3   senna-docusate (SENOKOT-S) 8.6-50 MG tablet Take by mouth.     Vibegron 75 MG TABS Take 1 tablet (75 mg total)  by mouth daily. 30 tablet 5   Vitamin D, Ergocalciferol, (DRISDOL) 1.25 MG (50000 UNIT) CAPS capsule Take 1 capsule (50,000 Units total) by mouth every 7 (seven) days. 4 capsule 1   amoxicillin-clavulanate (AUGMENTIN) 875-125 MG tablet Take 1 tablet by mouth 2 (two) times daily. (Patient not taking: Reported on 01/06/2024)     Cholecalciferol (VITAMIN D HIGH POTENCY) 25 MCG (1000 UT) capsule Take 1,000 Units by mouth daily. (Patient not taking: Reported on 01/06/2024)     No current facility-administered medications for this visit.    LABS/IMAGING: No results found for this or any previous visit (from the past 48 hours). No results found.  VITALS: BP (!) 147/78   Pulse 73   Ht 5\' 8"  (1.727 m)   Wt 274 lb (124.3 kg)   LMP 01/13/2014 Comment: spotting in Feb and bleeding since April  SpO2 96%   BMI 41.66 kg/m   EXAM: General appearance: alert and no distress Neck: no carotid bruit and no JVD Lungs: clear to auscultation bilaterally Heart: regular rate and rhythm, S1,  S2 normal, no murmur, click, rub or gallop Abdomen: soft, non-tender; bowel sounds normal; no masses,  no organomegaly and morbidly obese Extremities: extremities normal, atraumatic, no cyanosis or edema Pulses: 2+ and symmetric Skin: Keloid scar of the midsternal incision site with a dilated varicose vein at the distal aspect Neurologic: Grossly normal  EKG: EKG Interpretation Date/Time:  Friday January 06 2024 10:33:54 EST Ventricular Rate:  69 PR Interval:  180 QRS Duration:  86 QT Interval:  422 QTC Calculation: 452 R Axis:   57  Text Interpretation: Sinus rhythm with frequent Premature ventricular complexes Nonspecific T wave abnormality When compared with ECG of 07-Dec-2023 23:23, Premature ventricular complexes are now Present Vent. rate has decreased BY  35 BPM Nonspecific T wave abnormality now evident in Lateral leads Confirmed by Zoila Shutter [phone]) on 01/06/2024 10:41:06 AM     ASSESSMENT: PSVT Coronary artery disease status post 5 vessel CABG (2015) -low risk Myoview 02/2017 Morbid obesity Hypertension Dyslipidemia OSA, not on cPAP Keloid scar - s/p breast reduction, possible upcoming surgery for scar revision Palpitations Leg edema ?  Prolonged QTC-review suggests this is likely not prolonged due to flattening of the T wave  PLAN: 1.   Ms. Berwanger was recently in the emergency department with an SVT.  She had previously been on a beta-blocker but it was stopped due to some bradycardia.  I think she may tolerate a lower dose.  I would advise starting metoprolol tartrate 25 mg twice daily.  In addition she is not tolerating her calcium channel blocker.  Will stop that and the losartan and switch her to valsartan HCTZ 320/12.5 mg daily.  She should monitor blood pressures at home and report back.  I will also obtain an echocardiogram because of her SVT as well as to follow-up on low normal LVEF in the past and for preoperative clearance for upcoming procedure in April.  Plan follow-up with me or APP in about 3 months or sooner as necessary.  Chrystie Nose, MD, [facility], FACP  [place]  [facility]  Medical Director of the [facility]  Card[facility]ovascular [facility] Reduction [facility] Diplomate of the American Board of Clinical Lipidology Attending Cardiologist  Direct Dial: [phone]  Fax: [phone]  Website:  [email or web address] Nicely Sandrika Schwinn 01/06/2024, 10:41 AM

## 2024-01-09 ENCOUNTER — Other Ambulatory Visit: Payer: Self-pay | Admitting: Student

## 2024-01-21 ENCOUNTER — Encounter (HOSPITAL_COMMUNITY): Payer: Self-pay | Admitting: Internal Medicine

## 2024-01-21 ENCOUNTER — Emergency Department (HOSPITAL_COMMUNITY)

## 2024-01-21 ENCOUNTER — Ambulatory Visit (HOSPITAL_COMMUNITY)
Admission: EM | Admit: 2024-01-21 | Discharge: 2024-01-21 | Disposition: A | Discharge status: Other Acute Inpatient Hospital | Attending: Emergency Medicine | Admitting: Emergency Medicine

## 2024-01-21 ENCOUNTER — Encounter (HOSPITAL_COMMUNITY): Payer: Self-pay | Admitting: Emergency Medicine

## 2024-01-21 ENCOUNTER — Ambulatory Visit (HOSPITAL_COMMUNITY)

## 2024-01-21 ENCOUNTER — Other Ambulatory Visit: Payer: Self-pay

## 2024-01-21 ENCOUNTER — Emergency Department (HOSPITAL_BASED_OUTPATIENT_CLINIC_OR_DEPARTMENT_OTHER)

## 2024-01-21 ENCOUNTER — Observation Stay (HOSPITAL_COMMUNITY)
Admission: EM | Admit: 2024-01-21 | Discharge: 2024-01-23 | Disposition: A | Discharge status: Home / Self Care | Attending: Internal Medicine | Admitting: Internal Medicine

## 2024-01-21 DIAGNOSIS — E785 Hyperlipidemia, unspecified: Secondary | ICD-10-CM | POA: Insufficient documentation

## 2024-01-21 DIAGNOSIS — R9082 White matter disease, unspecified: Secondary | ICD-10-CM | POA: Diagnosis not present

## 2024-01-21 DIAGNOSIS — Z951 Presence of aortocoronary bypass graft: Secondary | ICD-10-CM | POA: Diagnosis not present

## 2024-01-21 DIAGNOSIS — I6389 Other cerebral infarction: Secondary | ICD-10-CM | POA: Diagnosis not present

## 2024-01-21 DIAGNOSIS — I639 Cerebral infarction, unspecified: Principal | ICD-10-CM | POA: Diagnosis present

## 2024-01-21 DIAGNOSIS — Z8542 Personal history of malignant neoplasm of other parts of uterus: Secondary | ICD-10-CM | POA: Diagnosis not present

## 2024-01-21 DIAGNOSIS — Z96652 Presence of left artificial knee joint: Secondary | ICD-10-CM | POA: Diagnosis not present

## 2024-01-21 DIAGNOSIS — R001 Bradycardia, unspecified: Secondary | ICD-10-CM | POA: Insufficient documentation

## 2024-01-21 DIAGNOSIS — Z8679 Personal history of other diseases of the circulatory system: Secondary | ICD-10-CM | POA: Insufficient documentation

## 2024-01-21 DIAGNOSIS — Z91040 Latex allergy status: Secondary | ICD-10-CM | POA: Insufficient documentation

## 2024-01-21 DIAGNOSIS — R29810 Facial weakness: Secondary | ICD-10-CM

## 2024-01-21 DIAGNOSIS — K219 Gastro-esophageal reflux disease without esophagitis: Secondary | ICD-10-CM | POA: Diagnosis not present

## 2024-01-21 DIAGNOSIS — I2510 Atherosclerotic heart disease of native coronary artery without angina pectoris: Secondary | ICD-10-CM | POA: Insufficient documentation

## 2024-01-21 DIAGNOSIS — J45909 Unspecified asthma, uncomplicated: Secondary | ICD-10-CM | POA: Insufficient documentation

## 2024-01-21 DIAGNOSIS — Z7982 Long term (current) use of aspirin: Secondary | ICD-10-CM | POA: Insufficient documentation

## 2024-01-21 DIAGNOSIS — R32 Unspecified urinary incontinence: Secondary | ICD-10-CM | POA: Insufficient documentation

## 2024-01-21 DIAGNOSIS — Z7902 Long term (current) use of antithrombotics/antiplatelets: Secondary | ICD-10-CM | POA: Insufficient documentation

## 2024-01-21 DIAGNOSIS — I10 Essential (primary) hypertension: Secondary | ICD-10-CM | POA: Diagnosis not present

## 2024-01-21 DIAGNOSIS — R29898 Other symptoms and signs involving the musculoskeletal system: Secondary | ICD-10-CM | POA: Diagnosis not present

## 2024-01-21 DIAGNOSIS — Z91010 Allergy to peanuts: Secondary | ICD-10-CM | POA: Insufficient documentation

## 2024-01-21 DIAGNOSIS — Z79899 Other long term (current) drug therapy: Secondary | ICD-10-CM | POA: Insufficient documentation

## 2024-01-21 DIAGNOSIS — R2681 Unsteadiness on feet: Secondary | ICD-10-CM | POA: Diagnosis not present

## 2024-01-21 DIAGNOSIS — Z8673 Personal history of transient ischemic attack (TIA), and cerebral infarction without residual deficits: Secondary | ICD-10-CM | POA: Diagnosis present

## 2024-01-21 DIAGNOSIS — R0989 Other specified symptoms and signs involving the circulatory and respiratory systems: Secondary | ICD-10-CM | POA: Diagnosis not present

## 2024-01-21 DIAGNOSIS — I6381 Other cerebral infarction due to occlusion or stenosis of small artery: Secondary | ICD-10-CM | POA: Diagnosis not present

## 2024-01-21 DIAGNOSIS — I6523 Occlusion and stenosis of bilateral carotid arteries: Secondary | ICD-10-CM | POA: Diagnosis not present

## 2024-01-21 LAB — PROTIME-INR
INR: 1 (ref 0.8–1.2)
Prothrombin Time: 13.6 s (ref 11.4–15.2)

## 2024-01-21 LAB — DIFFERENTIAL
Abs Immature Granulocytes: 0.01 10*3/uL (ref 0.00–0.07)
Basophils Absolute: 0 10*3/uL (ref 0.0–0.1)
Basophils Relative: 0 %
Eosinophils Absolute: 0.1 10*3/uL (ref 0.0–0.5)
Eosinophils Relative: 2 %
Immature Granulocytes: 0 %
Lymphocytes Relative: 36 %
Lymphs Abs: 2 10*3/uL (ref 0.7–4.0)
Monocytes Absolute: 0.5 10*3/uL (ref 0.1–1.0)
Monocytes Relative: 9 %
Neutro Abs: 2.8 10*3/uL (ref 1.7–7.7)
Neutrophils Relative %: 53 %

## 2024-01-21 LAB — COMPREHENSIVE METABOLIC PANEL
ALT: 10 U/L (ref 0–44)
AST: 16 U/L (ref 15–41)
Albumin: 3.3 g/dL — ABNORMAL LOW (ref 3.5–5.0)
Alkaline Phosphatase: 33 U/L — ABNORMAL LOW (ref 38–126)
Anion gap: 7 (ref 5–15)
BUN: 14 mg/dL (ref 8–23)
CO2: 26 mmol/L (ref 22–32)
Calcium: 9.1 mg/dL (ref 8.9–10.3)
Chloride: 106 mmol/L (ref 98–111)
Creatinine, Ser: 0.77 mg/dL (ref 0.44–1.00)
GFR, Estimated: >60 mL/min (ref >60)
Glucose, Bld: 111 mg/dL — ABNORMAL HIGH (ref 70–99)
Potassium: 3.8 mmol/L (ref 3.5–5.1)
Sodium: 139 mmol/L (ref 135–145)
Total Bilirubin: 0.3 mg/dL (ref 0.0–1.2)
Total Protein: 7 g/dL (ref 6.5–8.1)

## 2024-01-21 LAB — I-STAT CHEM 8, ED
BUN: 15 mg/dL (ref 8–23)
Calcium, Ion: 1.04 mmol/L — ABNORMAL LOW (ref 1.15–1.40)
Chloride: 106 mmol/L (ref 98–111)
Creatinine, Ser: 0.9 mg/dL (ref 0.44–1.00)
Glucose, Bld: 107 mg/dL — ABNORMAL HIGH (ref 70–99)
HCT: 37 % (ref 36.0–46.0)
Hemoglobin: 12.6 g/dL (ref 12.0–15.0)
Potassium: 3.8 mmol/L (ref 3.5–5.1)
Sodium: 139 mmol/L (ref 135–145)
TCO2: 26 mmol/L (ref 22–32)

## 2024-01-21 LAB — CBC
HCT: 37.6 % (ref 36.0–46.0)
Hemoglobin: 12.5 g/dL (ref 12.0–15.0)
MCH: 28.6 pg (ref 26.0–34.0)
MCHC: 33.2 g/dL (ref 30.0–36.0)
MCV: 86 fL (ref 80.0–100.0)
Platelets: 197 10*3/uL (ref 150–400)
RBC: 4.37 MIL/uL (ref 3.87–5.11)
RDW: 14.9 % (ref 11.5–15.5)
WBC: 5.5 10*3/uL (ref 4.0–10.5)
nRBC: 0 % (ref 0.0–0.2)

## 2024-01-21 LAB — ETHANOL: Alcohol, Ethyl (B): <10 mg/dL (ref <10)

## 2024-01-21 LAB — CBG MONITORING, ED: Glucose-Capillary: 111 mg/dL — ABNORMAL HIGH (ref 70–99)

## 2024-01-21 LAB — APTT: aPTT: 29 s (ref 24–36)

## 2024-01-21 LAB — POCT FASTING CBG KUC MANUAL ENTRY: POCT Glucose (KUC): 126 mg/dL — AB (ref 70–99)

## 2024-01-21 MED ORDER — CLOPIDOGREL BISULFATE 75 MG PO TABS
75.0000 mg | ORAL_TABLET | Freq: Every day | ORAL | Status: DC
  Administered 2024-01-22 – 2024-01-23 (×2): 75 mg via ORAL
  Filled 2024-01-21 (×2): qty 1

## 2024-01-21 MED ORDER — ENOXAPARIN SODIUM 40 MG/0.4ML IJ SOSY
40.0000 mg | PREFILLED_SYRINGE | INTRAMUSCULAR | Status: DC
  Administered 2024-01-21 – 2024-01-22 (×2): 40 mg via SUBCUTANEOUS
  Filled 2024-01-21 (×2): qty 0.4

## 2024-01-21 MED ORDER — SODIUM CHLORIDE 0.9 % IV SOLN: INTRAVENOUS | Status: DC

## 2024-01-21 MED ORDER — ACETAMINOPHEN 650 MG RE SUPP: 650.0000 mg | RECTAL | Status: DC | PRN

## 2024-01-21 MED ORDER — STROKE: EARLY STAGES OF RECOVERY BOOK
Freq: Once | Status: AC
  Filled 2024-01-21: qty 1

## 2024-01-21 MED ORDER — ACETAMINOPHEN 325 MG PO TABS: 650.0000 mg | ORAL_TABLET | ORAL | Status: DC | PRN

## 2024-01-21 MED ORDER — STROKE: EARLY STAGES OF RECOVERY BOOK: Freq: Once | Status: DC

## 2024-01-21 MED ORDER — ALUM & MAG HYDROXIDE-SIMETH 200-200-20 MG/5ML PO SUSP: 15.0000 mL | ORAL | Status: DC | PRN

## 2024-01-21 MED ORDER — CLOPIDOGREL BISULFATE 300 MG PO TABS
300.0000 mg | ORAL_TABLET | Freq: Once | ORAL | Status: AC
Start: 2024-01-21 — End: 2024-01-21
  Administered 2024-01-21: 300 mg via ORAL
  Filled 2024-01-21: qty 1

## 2024-01-21 MED ORDER — PANTOPRAZOLE SODIUM 40 MG PO TBEC
40.0000 mg | DELAYED_RELEASE_TABLET | Freq: Every day | ORAL | Status: DC
  Administered 2024-01-21 – 2024-01-23 (×3): 40 mg via ORAL
  Filled 2024-01-21 (×3): qty 1

## 2024-01-21 MED ORDER — SENNOSIDES-DOCUSATE SODIUM 8.6-50 MG PO TABS: 1.0000 | ORAL_TABLET | Freq: Every evening | ORAL | Status: DC | PRN

## 2024-01-21 MED ORDER — ASPIRIN 81 MG PO TBEC
81.0000 mg | DELAYED_RELEASE_TABLET | Freq: Every day | ORAL | Status: DC
  Administered 2024-01-21 – 2024-01-23 (×3): 81 mg via ORAL
  Filled 2024-01-21 (×3): qty 1

## 2024-01-21 MED ORDER — ACETAMINOPHEN 160 MG/5ML PO SOLN: 650.0000 mg | ORAL | Status: DC | PRN

## 2024-01-21 MED ORDER — ROSUVASTATIN CALCIUM 20 MG PO TABS
20.0000 mg | ORAL_TABLET | Freq: Every day | ORAL | Status: DC
  Administered 2024-01-21 – 2024-01-22 (×2): 20 mg via ORAL
  Filled 2024-01-21 (×2): qty 1

## 2024-01-21 NOTE — ED Notes (Signed)
Patient able to ambulate to and from bathroom independently with steady gait.

## 2024-01-21 NOTE — Code Documentation (Signed)
 Stroke Response Nurse Documentation Code Documentation  [name] is a 68 y.o. female arriving to [facility]  via [facility] on 01/21/24 with past medical hx of CAD, GERD, HLD. Stroke activated at Urgent Care. LKW unclear as patient is unsure when she was last normal. She went to church this morning and daughter noticed right droop, she was then driven to urgent care by a friend.   Stroke team met patient at bridge. Labs drawn, patient taken to CT. NIH 3, see flowsheet for details. CT and MRI completed. No TNK as too mild to treat, no thrombectomy as no LVO. Care Plan: q2h NIH/vitals. Bedside handoff with ED RN Shannon/Marlow.    Scarlette Slice K  Rapid Response RN

## 2024-01-21 NOTE — ED Provider Notes (Signed)
 [place] EMERGENCY DEPARTMENT AT [facility] Provider Note   CSN: [id number] Arrival date & time: 01/21/24  1227     History  Chief Complaint  Patient presents with   Code Stroke    KETTY BITTON is a 68 y.o. female.  Patient is a 68 year old female with a history of hypertension, coronary artery disease, asthma, prior uterine cancer who presents with facial drooping as a code stroke.  She presents from urgent care.  She was noted to have facial drooping while she was at church.  Last known normal was 8:00 this morning.  She feels weak all over but denies any lateral weakness.  She denies any speech deficits or vision changes.       Home Medications Prior to Admission medications   Medication Sig Start Date End Date Taking? Authorizing Provider  albuterol (VENTOLIN HFA) 108 (90 Base) MCG/ACT inhaler Inhale 2 puffs into the lungs every 6 (six) hours as needed for wheezing or shortness of breath. 12/28/22   Tyson Alias, MD  alum & mag hydroxide-simeth (MAALOX PLUS) 400-400-40 MG/5ML suspension Take 15 mLs by mouth every 6 (six) hours as needed for indigestion. 11/03/22   Mesner, Barbara Cower, MD  Cholecalciferol (VITAMIN D HIGH POTENCY) 25 MCG (1000 UT) capsule Take 1,000 Units by mouth daily. Patient not taking: Reported on 01/06/2024    [provider]  Clobetasol Prop Emollient Base (CLOBETASOL PROPIONATE E) 0.05 % emollient cream Apply 1 Application topically at bedtime. 06/22/23   Selmer Dominion, NP  metoprolol tartrate (LOPRESSOR) 25 MG tablet Take 1 tablet (25 mg total) by mouth 2 (two) times daily. 01/06/24 04/05/24  Chrystie Nose, MD  Multiple Vitamin (MULTIVITAMIN WITH MINERALS) TABS tablet Take 1 tablet by mouth every Monday, Wednesday, and Friday.    [provider]  nystatin ointment (MYCOSTATIN) Apply 1 Application topically 2 (two) times daily. Apply to affected area for up to 7 days for skin irritation 09/30/23   Selmer Dominion, NP   omeprazole (PRILOSEC) 20 MG capsule TAKE 1 CAPSULE BY MOUTH EVERY DAY BEFORE BREAKFAST 07/13/23   Meredith Pel, NP  Polyethyl Glycol-Propyl Glycol (SYSTANE FREE OP) Apply 1 tablet to eye daily as needed (For dry eyes).    [provider]  rosuvastatin (CRESTOR) 20 MG tablet Take 1 tablet (20 mg total) by mouth daily. 12/16/23   Modena Slater, DO  senna-docusate (SENOKOT-S) 8.6-50 MG tablet Take by mouth. 07/21/14   [provider]  valsartan-hydrochlorothiazide (DIOVAN-HCT) 320-12.5 MG tablet Take 1 tablet by mouth daily. 01/06/24   Hilty, Lisette Abu, MD  Vibegron 75 MG TABS Take 1 tablet (75 mg total) by mouth daily. 10/31/23   Selmer Dominion, NP  Vitamin D, Ergocalciferol, (DRISDOL) 1.25 MG (50000 UNIT) CAPS capsule Take 1 capsule (50,000 Units total) by mouth every 7 (seven) days. 12/19/23 02/17/24  Modena Slater, DO      Allergies    Cephalosporins, Eicosapentaenoic acid (epa) (fish), Fish-derived products, Peanuts [peanut oil], Shrimp [shellfish allergy], Latex, Metronidazole, Methotrexate derivatives, Ciprofloxacin, Lisinopril, Naproxen, Other, and Prednisone    Review of Systems   Review of Systems  Constitutional:  Positive for fatigue. Negative for chills, diaphoresis and fever.  HENT:  Negative for congestion, rhinorrhea and sneezing.   Eyes: Negative.   Respiratory:  Negative for cough, chest tightness and shortness of breath.   Cardiovascular:  Negative for chest pain and leg swelling.  Gastrointestinal:  Negative for abdominal pain, blood in stool, diarrhea, nausea and  vomiting.  Genitourinary:  Negative for difficulty urinating, flank pain, frequency and hematuria.  Musculoskeletal:  Negative for arthralgias and back pain.  Skin:  Negative for rash.  Neurological:  Positive for facial asymmetry and weakness. Negative for dizziness, speech difficulty, numbness and headaches.    Physical Exam Updated Vital Signs BP (!) 157/88   Pulse (!) 53   Temp (!) 97.3 F  (36.3 C) (Temporal)   Resp 14   Wt 128.6 kg   LMP 01/13/2014 Comment: spotting in Feb and bleeding since April  SpO2 100%   BMI 43.11 kg/m  Physical Exam Constitutional:      Appearance: She is well-developed.  HENT:     Head: Normocephalic and atraumatic.  Eyes:     Pupils: Pupils are equal, round, and reactive to light.  Cardiovascular:     Rate and Rhythm: Regular rhythm. Bradycardia present.     Heart sounds: Normal heart sounds.  Pulmonary:     Effort: Pulmonary effort is normal. No respiratory distress.     Breath sounds: Normal breath sounds. No wheezing or rales.  Chest:     Chest wall: No tenderness.  Abdominal:     General: Bowel sounds are normal.     Palpations: Abdomen is soft.     Tenderness: There is no abdominal tenderness. There is no guarding or rebound.  Musculoskeletal:        General: Normal range of motion.     Cervical back: Normal range of motion and neck supple.  Lymphadenopathy:     Cervical: No cervical adenopathy.  Skin:    General: Skin is warm and dry.     Findings: No rash.  Neurological:     Mental Status: She is alert and oriented to person, place, and time.     Comments: Slight drooping of the right face.  Motor 5 out of 5 all extremities, sensation grossly intact to light touch all extremities     ED Results / Procedures / Treatments   Labs (all labs ordered are listed, but only abnormal results are displayed) Labs Reviewed  COMPREHENSIVE METABOLIC PANEL - Abnormal; Notable for the following components:      Result Value   Glucose, Bld 111 (*)    Albumin 3.3 (*)    Alkaline Phosphatase 33 (*)    All other components within normal limits  I-STAT CHEM 8, ED - Abnormal; Notable for the following components:   Glucose, Bld 107 (*)    Calcium, Ion 1.04 (*)    All other components within normal limits  CBG MONITORING, ED - Abnormal; Notable for the following components:   Glucose-Capillary 111 (*)    All other components within  normal limits  ETHANOL  PROTIME-INR  APTT  CBC  DIFFERENTIAL  RAPID URINE DRUG SCREEN, HOSP PERFORMED  URINALYSIS, ROUTINE W REFLEX MICROSCOPIC    EKG EKG Interpretation Date/Time:  Saturday January 21 2024 13:31:46 EST Ventricular Rate:  49 PR Interval:  186 QRS Duration:  95 QT Interval:  472 QTC Calculation: 427 R Axis:   32  Text Interpretation: Sinus bradycardia Ventricular premature complex Borderline T abnormalities, lateral leads Confirmed by Rolan Bucco [phone]) on 01/21/2024 2:09:23 PM  Radiology MR ANGIO HEAD WO CONTRAST Result Date: 01/21/2024 CLINICAL DATA:  Right facial droop and bilateral lower extremity weakness. Left internal capsule infarct. EXAM: MRA HEAD WITHOUT CONTRAST TECHNIQUE: Angiographic images of the Circle of Willis were acquired using MRA technique without intravenous contrast. COMPARISON:  MR head without contrast 01/21/2024  FINDINGS: Anterior circulation: Atherosclerotic irregularity is present within the cavernous internal carotid arteries bilaterally without a significant stenosis through the ICA termini. The left A1 segment is dominant. Anterior communicating artery is patent. Moderate focal stenosis is present in the distal left M1 segment. The ACA and MCA branch vessels are within normal limits. No aneurysm is present. Posterior circulation: The vertebral arteries are codominant. PICA origins are visualized and normal. The vertebrobasilar junction and basilar artery are normal. Moderate stenosis is present the proximal right P2 segment. The PCA branch vessels are otherwise normal. No aneurysm is present. Anatomic variants: None Other: None. IMPRESSION: 1. No large vessel occlusion. 2. Moderate focal stenosis in the distal left M1 segment. 3. Moderate stenosis of the proximal right P2 segment. 4. Atherosclerotic irregularity within the cavernous internal carotid arteries bilaterally without a significant stenosis through the ICA termini. Electronically Signed    By: Marin Roberts M.D.   On: 01/21/2024 14:53   MR BRAIN WO CONTRAST Result Date: 01/21/2024 CLINICAL DATA:  Acute onset of right-sided facial droop and bilateral lower extremity weakness. Unsteady gait. EXAM: MRI HEAD WITHOUT CONTRAST TECHNIQUE: Multiplanar, multiecho pulse sequences of the brain and surrounding structures were obtained without intravenous contrast. COMPARISON:  CT head without contrast 01/21/2024. FINDINGS: Brain: A 13 mm acute nonhemorrhagic infarct is present within the left internal capsule. Subtle T2 and FLAIR signal changes are present. Periventricular T2 hyperintensities are moderately advanced for age. The deep gray nuclei are otherwise within normal limits. The ventricles are of normal size. No significant extraaxial fluid collection is present. The brainstem and cerebellum are within normal limits. The internal auditory canals are within normal limits. Vascular: Flow is present in the major intracranial arteries. Skull and upper cervical spine: The craniocervical junction is normal. Upper cervical spine is within normal limits. Marrow signal is unremarkable. 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 Sinuses/Orbits: The paranasal sinuses and mastoid air cells are clear. Right lens replacement is present. Globes and orbits are otherwise within normal limits. IMPRESSION: 1. 13 mm acute nonhemorrhagic infarct of the left internal capsule. 2. Periventricular T2 hyperintensities are moderately advanced for age. This likely reflects the sequela of chronic microvascular ischemia. The above was relayed via text pager to [facility], NP on 01/21/2024 at 14:50 . Electronically Signed   By: Marin Roberts M.D.   On: 01/21/2024 14:50   CT HEAD CODE STROKE WO CONTRAST Result Date: 01/21/2024 CLINICAL DATA:  Code stroke. EXAM: CT HEAD WITHOUT CONTRAST TECHNIQUE: Contiguous axial images were obtained from the base of the skull through the vertex without intravenous  contrast. RADIATION DOSE REDUCTION: This exam was performed according to the departmental dose-optimization program which includes automated exposure control, adjustment of the mA and/or kV according to patient size and/or use of iterative reconstruction technique. COMPARISON:  09/29/2014 FINDINGS: Brain: No evidence of acute infarction, hemorrhage, hydrocephalus, extra-axial collection or mass lesion/mass effect. Mild low-density in the cerebral white matter attributed to chronic small vessel disease. Dural calcification especially at the vertex. Vascular: No hyperdense vessel or unexpected calcification. Skull: Normal. Negative for fracture or focal lesion. Sinuses/Orbits: No acute finding. Other: Prelim sent in epic chat. IMPRESSION: 1. No acute finding. 2. Mild chronic small vessel disease. Electronically Signed   By: Tiburcio Pea M.D.   On: 01/21/2024 12:44    Procedures Procedures    Medications Ordered in ED Medications   stroke: early stages of recovery book (has no administration in time range)  aspirin EC tablet  81 mg (81 mg Oral Given 01/21/24 1518)  clopidogrel (PLAVIX) tablet 75 mg (has no administration in time range)  clopidogrel (PLAVIX) tablet 300 mg (300 mg Oral Given 01/21/24 1517)    ED Course/ Medical Decision Making/ A&P                                 Medical Decision Making Risk Decision regarding hospitalization.   Patient is a 69 year old who presents with facial drooping as a code stroke.  She was not a candidate for tPA given her mild symptoms.  CT scan does not show any acute abnormality.  MR shows a small acute infarct.  Labs reviewed and are nonconcerning.  She is mildly bradycardic but her blood pressure stable.  Will plan admission for stroke workup.  Patient has been seen by neurology.  Discussed with the internal medicine teaching service he will admit the patient for further evaluation.  CRITICAL CARE Performed by: Rolan Bucco Total critical care time:  60 minutes Critical care time was exclusive of separately billable procedures and treating other patients. Critical care was necessary to treat or prevent imminent or life-threatening deterioration. Critical care was time spent personally by me on the following activities: development of treatment plan with patient and/or surrogate as well as nursing, discussions with consultants, evaluation of patient's response to treatment, examination of patient, obtaining history from patient or surrogate, ordering and performing treatments and interventions, ordering and review of laboratory studies, ordering and review of radiographic studies, pulse oximetry and re-evaluation of patient's condition.   Final Clinical Impression(s) / ED Diagnoses Final diagnoses:  Cerebral infarction, unspecified mechanism [facility])    Rx / DC Orders ED Discharge Orders     None         Rolan Bucco, MD 01/21/24 1526

## 2024-01-21 NOTE — ED Notes (Signed)
 Patient is being discharged from the Urgent Care and sent to the Emergency Department via carelink . Per [place] Garrison, NP, patient is in need of higher level of care due to stoke symptoms. Patient is aware and verbalizes understanding of plan of care.  Vitals:   01/21/24 1137  BP: 138/69  Pulse: 60  Resp: 18  Temp: 98 F (36.7 C)  SpO2: 98%

## 2024-01-21 NOTE — ED Notes (Signed)
 ED TO INPATIENT HANDOFF REPORT  ED Nurse Name and Phone #: Marcie Bal RN, [phone]  S Name/Age/Gender [name] 68 y.o. female Room/Bed: 039C/039C  Code Status   Code Status: Full Code  Home/SNF/Other Home Patient oriented to: self, place, time, and situation Is this baseline? Yes   Triage Complete: Triage complete  Chief Complaint Acute CVA (cerebrovascular accident) [facility]) [I63.9]  Triage Note Pt BIB [facility] from [facility] as Code Stroke. LKW [id number]. Right sided facial droop. Per EMS unsteady gait but ambulatory to stretcher.   110/60s 50s HR with PVCs and PACs 989% RA 126 CBG  HX  HTN / CAD   Allergies Allergies  Allergen Reactions   Cephalosporins Anaphylaxis, Swelling and Other (See Comments)    Tongue swelling Gumpain   Eicosapentaenoic Acid (Epa) (Fish) Anaphylaxis and Shortness Of Breath   Fish-Derived Products Anaphylaxis and Shortness Of Breath   Flagyl [Metronidazole] Shortness Of Breath, Palpitations, Other (See Comments) and Hypertension    Metallic taste Dry Mouth   Peanuts [Peanut Oil] Anaphylaxis, Shortness Of Breath and Other (See Comments)    Peanut butter   Shrimp [Shellfish Allergy] Anaphylaxis and Swelling    Throat swelling   Latex Itching   Cipro [Ciprofloxacin Hcl] Other (See Comments)    Gagging  Muscle/body aches   Diovan Hct [Valsartan-Hydrochlorothiazide] Nausea Only   Methotrexate Derivatives Nausea Only   Aleve [Naproxen] Nausea Only and Other (See Comments)    Headache   Other Itching and Other (See Comments)     All pain meds make her itch - has to have something to prevent that in addition to receiving med   Prednisone Palpitations and Other (See Comments)    Tachycardia OK with lower dosages     Zestril [Lisinopril] Cough         Level of Care/Admitting Diagnosis ED Disposition     ED Disposition  Admit   Condition  --   Comment  Hospital Area: [facility] [[id number]]  Level of Care:  Telemetry Medical [[id number]]  May place patient in observation at [facility] or [facility] if equivalent level of care is available:: No  Covid Evaluation: Asymptomatic - no recent exposure (last 10 days) testing not required  Diagnosis: Acute CVA (cerebrovascular accident) [facility]) [[id number]]  Admitting Physician: Gust Rung [[id number]]  Attending Physician: Silvio Pate          B Medical/Surgery History Past Medical History:  Diagnosis Date   Acute urinary retention s/p Foley 01/07/2012   Allergy    Anemia    Asthma    CAD (coronary artery disease)    a. 12/2013 s/p CABG x 5 Dr. Maren Beach (LIMA to LAD, SVG to diagonal, SVG to OM1, SVG to OM2, SVG to PDA)   Carotid arterial disease (HCC)    a. 12/2013 Carotid U/S: 1-39% bilat ICA stenosis.   Carpal tunnel syndrome, bilateral    GERD (gastroesophageal reflux disease)    Hemorrhoids, internal, with bleeding & prolapse 12/13/2011   Hernia, abdominal    History of blood transfusion    CABG and hysterectomy   History of kidney stones    Hyperlipidemia    Hypertension    Iron deficiency 12/07/2011   Keloid scar    a. Sternal Keloid s/p CABG with ongoing pain.   Morbid obesity (HCC)    Myocardial infarct (HCC)    2015   Neuropathy    Osteoarthritis    Osteoporosis    PONV (postoperative nausea  and vomiting)    Seasonal allergies    Sleep apnea    no CPAP machine; sleep study 02/2010 REM AHI 61.7/hr, total sleep REM 14.8/hr   Tooth abscess    Uterine cancer (HCC) 05/2014   clinical stage IA grade 1 endometrioid endometrial cancer   Past Surgical History:  Procedure Laterality Date   ARTERY BIOPSY Left 07/10/2021   Procedure: LEFT TEMPORAL ARTERY BIOPSY;  Surgeon: Nada Libman, MD;  Location: [facility] OR;  Service: Vascular;  Laterality: Left;   bladder tack     BREAST REDUCTION SURGERY Bilateral 10/10/2015   Procedure: BILATERAL MAMMARY REDUCTION  (BREAST) WITH FREE NIPPLE GRAFT;  Surgeon: Glenna Fellows, MD;   Location: [facility] OR;  Service: Plastics;  Laterality: Bilateral;   CARDIAC CATHETERIZATION     CATARACT EXTRACTION Right    COLONOSCOPY  2009   COLONOSCOPY W/ BIOPSIES AND POLYPECTOMY     CORONARY ARTERY BYPASS GRAFT N/A 01/09/2014   Procedure: CORONARY ARTERY BYPASS GRAFTING (CABG) x 5 using left internal mammary artery and right leg greater saphenous vein harvested endoscopically;  Surgeon: Kerin Perna, MD;  Location: [facility] OR;  Service: Open Heart Surgery;  Laterality: N/A;  please use bed extenders and breast binder   INTRAOPERATIVE TRANSESOPHAGEAL ECHOCARDIOGRAM N/A 01/09/2014   Procedure: INTRAOPERATIVE TRANSESOPHAGEAL ECHOCARDIOGRAM;  Surgeon: Kerin Perna, MD;  Location: [facility] OR;  Service: Open Heart Surgery;  Laterality: N/A;   KNEE ARTHROSCOPY  1991   KNEE SURGERY Bilateral 1999   LEFT HEART CATHETERIZATION WITH CORONARY ANGIOGRAM N/A 01/04/2014   Procedure: LEFT HEART CATHETERIZATION WITH CORONARY ANGIOGRAM;  Surgeon: Lesleigh Noe, MD;  Location: [facility] CATH LAB;  Service: Cardiovascular;  Laterality: N/A;   LESION EXCISION WITH COMPLEX REPAIR N/A 05/07/2016   Procedure: COMPLEX REPAIR OF CHEST 16 CM;  Surgeon: Glenna Fellows, MD;  Location: [facility] OR;  Service: Plastics;  Laterality: N/A;   MULTIPLE EXTRACTIONS WITH ALVEOLOPLASTY N/A 04/11/2014   Procedure: Extraction of tooth #'s 1,2,8,16 with alveoloplasty, maxillary tuberosity reductions, and gross debridement of remaining teeth.;  Surgeon: Charlynne Pander, DDS;  Location: [facility] OR;  Service: Oral Surgery;  Laterality: N/A;   NM MYOCAR PERF WALL MOTION  01/2010   dipyridamole myoview - moderate perfusion defect in basal inferoseptal, basal inferior, mid inferoseptal, mid inferior, apical inferior region; EF 56%   TEE WITHOUT CARDIOVERSION  01/2010   EF 60-65%, small, flat, non-infiltrating, calcified, fixed apical/septal mass   TOTAL KNEE ARTHROPLASTY Left 04/29/2011   transanal hemorrhoidal dearterliaization  01/06/2012   with  external hemorrhoid removal   UNILATERAL SALPINGECTOMY Right 06/03/2014   Procedure: UNILATERAL SALPINGECTOMY;  Surgeon: Willodean Rosenthal, MD;  Location: [facility] ORS;  Service: Gynecology;  Laterality: Right;   VAGINAL HYSTERECTOMY N/A 06/03/2014   Procedure: HYSTERECTOMY VAGINAL;  Surgeon: Willodean Rosenthal, MD;  Location: [facility] ORS;  Service: Gynecology;  Laterality: N/A;   WISDOM TOOTH EXTRACTION       A IV Location/Drains/Wounds Patient Lines/Drains/Airways Status     Active Line/Drains/Airways     Name Placement date Placement time Site Days   Peripheral IV 01/21/24 18 G Right Antecubital 01/21/24  1213  Antecubital  less than 1            Intake/Output Last 24 hours No intake or output data in the 24 hours ending 01/21/24 1951  Labs/Imaging Results for orders placed or performed during the hospital encounter of 01/21/24 (from the past 48 hours)  CBG monitoring, ED     Status: Abnormal  Collection Time: 01/21/24 12:32 PM  Result Value Ref Range   Glucose-Capillary 111 (H) 70 - 99 mg/dL    Comment: Glucose reference range applies only to samples taken after fasting for at least 8 hours.  Ethanol     Status: None   Collection Time: 01/21/24 12:36 PM  Result Value Ref Range   Alcohol, Ethyl (B) <10 <10 mg/dL    Comment: (NOTE) Lowest detectable limit for serum alcohol is 10 mg/dL.  For medical purposes only. Performed at [facility] Lab, [address] [place], [place] [address]   Protime-INR     Status: None   Collection Time: 01/21/24 12:36 PM  Result Value Ref Range   Prothrombin Time 13.6 11.4 - 15.2 seconds   INR 1.0 0.8 - 1.2    Comment: (NOTE) INR goal varies based on device and disease states. Performed at [facility], [address] [place], [place] [address]   APTT     Status: None   Collection Time: 01/21/24 12:36 PM  Result Value Ref Range   aPTT 29 24 - 36 seconds    Comment: Performed at [facility], [address], [place], [place] [address]  CBC     Status: None   Collection Time: 01/21/24 12:36 PM  Result Value Ref Range   WBC 5.5 4.0 - 10.5 K/uL   RBC 4.37 3.87 - 5.11 MIL/uL   Hemoglobin 12.5 12.0 - 15.0 g/dL   HCT 91.4 78.2 - 95.6 %   MCV 86.0 80.0 - 100.0 fL   MCH 28.6 26.0 - 34.0 pg   MCHC 33.2 30.0 - 36.0 g/dL   RDW 21.3 08.6 - 57.8 %   Platelets 197 150 - 400 K/uL   nRBC 0.0 0.0 - 0.2 %    Comment: Performed at [facility], [address], [place], [place] [address]  Differential     Status: None   Collection Time: 01/21/24 12:36 PM  Result Value Ref Range   Neutrophils Relative % 53 %   Neutro Abs 2.8 1.7 - 7.7 K/uL   Lymphocytes Relative 36 %   Lymphs Abs 2.0 0.7 - 4.0 K/uL   Monocytes Relative 9 %   Monocytes Absolute 0.5 0.1 - 1.0 K/uL   Eosinophils Relative 2 %   Eosinophils Absolute 0.1 0.0 - 0.5 K/uL   Basophils Relative 0 %   Basophils Absolute 0.0 0.0 - 0.1 K/uL   Immature Granulocytes 0 %   Abs Immature Granulocytes 0.01 0.00 - 0.07 K/uL    Comment: Performed at [facility], [address] [place], [place] [address]  Comprehensive metabolic panel     Status: Abnormal   Collection Time: 01/21/24 12:36 PM  Result Value Ref Range   Sodium 139 135 - 145 mmol/L   Potassium 3.8 3.5 - 5.1 mmol/L   Chloride 106 98 - 111 mmol/L   CO2 26 22 - 32 mmol/L   Glucose, Bld 111 (H) 70 - 99 mg/dL    Comment: Glucose reference range applies only to samples taken after fasting for at least 8 hours.   BUN 14 8 - 23 mg/dL   Creatinine, Ser 1.32 0.44 - 1.00 mg/dL   Calcium 9.1 8.9 - 44.0 mg/dL   Total Protein 7.0 6.5 - 8.1 g/dL   Albumin 3.3 (L) 3.5 - 5.0 g/dL   AST 16 15 - 41 U/L   ALT 10 0 - 44 U/L   Alkaline  Phosphatase 33 (L) 38 - 126 U/L   Total Bilirubin 0.3 0.0 - 1.2 mg/dL   GFR, Estimated >09 >81 mL/min    Comment: (NOTE) Calculated using the CKD-EPI Creatinine Equation (2021)    Anion gap 7 5 - 15    Comment: Performed at [facility], [address] [place], [place] [address]  I-stat chem 8, ED     Status: Abnormal   Collection Time: 01/21/24 12:37 PM  Result Value Ref Range   Sodium 139 135 - 145 mmol/L   Potassium 3.8 3.5 - 5.1 mmol/L   Chloride 106 98 - 111 mmol/L   BUN 15 8 - 23 mg/dL   Creatinine, Ser 8.29 0.44 - 1.00 mg/dL   Glucose, Bld 562 (H) 70 - 99 mg/dL    Comment: Glucose reference range applies only to samples taken after fasting for at least 8 hours.   Calcium, Ion 1.04 (L) 1.15 - 1.40 mmol/L   TCO2 26 22 - 32 mmol/L   Hemoglobin 12.6 12.0 - 15.0 g/dL   HCT 13.0 86.5 - 78.4 %   *Note: Due to a large number of results and/or encounters for the requested time period, some results have not been displayed. A complete set of results can be found in Results Review.   VAS US CAROTID Result Date: 01/21/2024 Carotid Arterial Duplex Study Patient Name:  [name]  Date of Exam:   01/21/2024 Medical Rec #: [id number]       Accession #:    [id number] Date of Birth: Mar 13, 1956       Patient Gender: F Patient Age:   22 years Exam Location:  [facility] Procedure:      VAS US CAROTID Referring Phys: Elmer Picker --------------------------------------------------------------------------------  Indications:       CVA and Cardiovascular symptoms. Risk Factors:      Hypertension, no history of smoking, coronary artery                    disease. Other Factors:     S/P CABG. Comparison Study:  Previous study on 2.14.2015. Performing Technologist: Fernande Bras  Examination Guidelines: A complete evaluation includes B-mode imaging, spectral Doppler, color Doppler, and power Doppler as needed of all accessible portions of each vessel. Bilateral testing is considered an integral part of a complete examination. Limited examinations for reoccurring indications may be performed as noted.  Right Carotid Findings: +----------+--------+--------+--------+------------------+--------+           PSV cm/sEDV cm/sStenosisPlaque  DescriptionComments +----------+--------+--------+--------+------------------+--------+ CCA Prox  73      12                                         +----------+--------+--------+--------+------------------+--------+ CCA Distal77      17                                         +----------+--------+--------+--------+------------------+--------+ ICA Prox  77      16              calcific                   +----------+--------+--------+--------+------------------+--------+ ICA Mid   71      19                                         +----------+--------+--------+--------+------------------+--------+  ICA Distal67      22                                         +----------+--------+--------+--------+------------------+--------+ ECA       280     36                                         +----------+--------+--------+--------+------------------+--------+ +----------+--------+-------+--------+-------------------+           PSV cm/sEDV cmsDescribeArm Pressure (mmHG) +----------+--------+-------+--------+-------------------+ Subclavian129                                        +----------+--------+-------+--------+-------------------+ +---------+--------+--+--------+--+ VertebralPSV cm/s50EDV cm/s13 +---------+--------+--+--------+--+ Slightly elevated velocities without obvious signs of stenosis. Left Carotid Findings: +----------+--------+--------+--------+------------------+--------+           PSV cm/sEDV cm/sStenosisPlaque DescriptionComments +----------+--------+--------+--------+------------------+--------+ CCA Prox  97      19                                         +----------+--------+--------+--------+------------------+--------+ CCA Distal68      25                                         +----------+--------+--------+--------+------------------+--------+ ICA Prox  66      25              heterogenous                +----------+--------+--------+--------+------------------+--------+ ICA Mid   70      19                                         +----------+--------+--------+--------+------------------+--------+ ICA Distal70      13                                         +----------+--------+--------+--------+------------------+--------+ ECA       75      9                                          +----------+--------+--------+--------+------------------+--------+ +----------+--------+--------+--------+-------------------+           PSV cm/sEDV cm/sDescribeArm Pressure (mmHG) +----------+--------+--------+--------+-------------------+ [id number]                                         +----------+--------+--------+--------+-------------------+ +---------+--------+--+--------+--+ VertebralPSV cm/s50EDV cm/s17 +---------+--------+--+--------+--+   Summary: Right Carotid: Velocities in the right ICA are consistent with a 1-39% stenosis. Left Carotid: Velocities in the left ICA are consistent with a 1-39% stenosis. Vertebrals:  Bilateral vertebral arteries demonstrate antegrade flow. Subclavians: Normal flow hemodynamics were seen in bilateral subclavian  arteries. *See table(s) above for measurements and observations.     Preliminary    MR ANGIO HEAD WO CONTRAST Result Date: 01/21/2024 CLINICAL DATA:  Right facial droop and bilateral lower extremity weakness. Left internal capsule infarct. EXAM: MRA HEAD WITHOUT CONTRAST TECHNIQUE: Angiographic images of the Circle of Willis were acquired using MRA technique without intravenous contrast. COMPARISON:  MR head without contrast 01/21/2024 FINDINGS: Anterior circulation: Atherosclerotic irregularity is present within the cavernous internal carotid arteries bilaterally without a significant stenosis through the ICA termini. The left A1 segment is dominant. Anterior communicating artery is patent. Moderate focal stenosis is present  in the distal left M1 segment. The ACA and MCA branch vessels are within normal limits. No aneurysm is present. Posterior circulation: The vertebral arteries are codominant. PICA origins are visualized and normal. The vertebrobasilar junction and basilar artery are normal. Moderate stenosis is present the proximal right P2 segment. The PCA branch vessels are otherwise normal. No aneurysm is present. Anatomic variants: None Other: None. IMPRESSION: 1. No large vessel occlusion. 2. Moderate focal stenosis in the distal left M1 segment. 3. Moderate stenosis of the proximal right P2 segment. 4. Atherosclerotic irregularity within the cavernous internal carotid arteries bilaterally without a significant stenosis through the ICA termini. Electronically Signed   By: Marin Roberts M.D.   On: 01/21/2024 14:53   MR BRAIN WO CONTRAST Result Date: 01/21/2024 CLINICAL DATA:  Acute onset of right-sided facial droop and bilateral lower extremity weakness. Unsteady gait. EXAM: MRI HEAD WITHOUT CONTRAST TECHNIQUE: Multiplanar, multiecho pulse sequences of the brain and surrounding structures were obtained without intravenous contrast. COMPARISON:  CT head without contrast 01/21/2024. FINDINGS: Brain: A 13 mm acute nonhemorrhagic infarct is present within the left internal capsule. Subtle T2 and FLAIR signal changes are present. Periventricular T2 hyperintensities are moderately advanced for age. The deep gray nuclei are otherwise within normal limits. The ventricles are of normal size. No significant extraaxial fluid collection is present. The brainstem and cerebellum are within normal limits. The internal auditory canals are within normal limits. Vascular: Flow is present in the major intracranial arteries. Skull and upper cervical spine: The craniocervical junction is normal. Upper cervical spine is within normal limits. Marrow signal is unremarkable. 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  Sinuses/Orbits: The paranasal sinuses and mastoid air cells are clear. Right lens replacement is present. Globes and orbits are otherwise within normal limits. IMPRESSION: 1. 13 mm acute nonhemorrhagic infarct of the left internal capsule. 2. Periventricular T2 hyperintensities are moderately advanced for age. This likely reflects the sequela of chronic microvascular ischemia. The above was relayed via text pager to [facility], NP on 01/21/2024 at 14:50 . Electronically Signed   By: Marin Roberts M.D.   On: 01/21/2024 14:50   CT HEAD CODE STROKE WO CONTRAST Result Date: 01/21/2024 CLINICAL DATA:  Code stroke. EXAM: CT HEAD WITHOUT CONTRAST TECHNIQUE: Contiguous axial images were obtained from the base of the skull through the vertex without intravenous contrast. RADIATION DOSE REDUCTION: This exam was performed according to the departmental dose-optimization program which includes automated exposure control, adjustment of the mA and/or kV according to patient size and/or use of iterative reconstruction technique. COMPARISON:  09/29/2014 FINDINGS: Brain: No evidence of acute infarction, hemorrhage, hydrocephalus, extra-axial collection or mass lesion/mass effect. Mild low-density in the cerebral white matter attributed to chronic small vessel disease. Dural calcification especially at the vertex. Vascular: No hyperdense vessel or unexpected calcification. Skull: Normal. Negative for fracture or focal  lesion. Sinuses/Orbits: No acute finding. Other: Prelim sent in epic chat. IMPRESSION: 1. No acute finding. 2. Mild chronic small vessel disease. Electronically Signed   By: Tiburcio Pea M.D.   On: 01/21/2024 12:44    Pending Labs Unresulted Labs (From admission, onward)     Start     Ordered   01/22/24 0500  Lipid panel  (Labs)  Tomorrow morning,   R       Comments: Fasting    01/21/24 1456   01/22/24 0500  HIV Antibody (routine testing w rflx)  (HIV Antibody (Routine testing w reflex) panel)   Tomorrow morning,   R        01/21/24 1556   01/21/24 1231  Urine rapid drug screen (hosp performed)  Once,   STAT        01/21/24 1230   01/21/24 1231  Urinalysis, Routine w reflex microscopic -Urine, Clean Catch  Once,   URGENT       Question:  Specimen Source  Answer:  Urine, Clean Catch   01/21/24 1230   Pending  Protime-INR  (Stroke Panel (PNL))  ONCE - STAT,   R        Pending   Pending  APTT  (Stroke Panel (PNL))  ONCE - STAT,   R        Pending   Pending  CBC  (Stroke Panel (PNL))  ONCE - STAT,   R        Pending   Pending  Differential  (Stroke Panel (PNL))  ONCE - STAT,   R        Pending   Pending  Comprehensive metabolic panel  (Stroke Panel (PNL))  ONCE - STAT,   R        Pending   Pending  Ethanol  (Stroke Panel (PNL))  Once,   STAT        Pending            Vitals/Pain Today's Vitals   01/21/24 1330 01/21/24 1430 01/21/24 1924 01/21/24 1930  BP: (!) 161/86 (!) 157/88  (!) 181/82  Pulse: (!) 53   78  Resp: 17 14  18   Temp: (!) 97.3 F (36.3 C)     TempSrc: Temporal     SpO2: 100% 100%  100%  Weight:      PainSc:   0-No pain     Isolation Precautions No active isolations  Medications Medications  aspirin EC tablet 81 mg (81 mg Oral Given 01/21/24 1518)  clopidogrel (PLAVIX) tablet 75 mg (has no administration in time range)   stroke: early stages of recovery book (has no administration in time range)  0.9 %  sodium chloride infusion ( Intravenous New Bag/Given 01/21/24 1920)  acetaminophen (TYLENOL) tablet 650 mg (has no administration in time range)    Or  acetaminophen (TYLENOL) 160 MG/5ML solution 650 mg (has no administration in time range)    Or  acetaminophen (TYLENOL) suppository 650 mg (has no administration in time range)  senna-docusate (Senokot-S) tablet 1 tablet (has no administration in time range)  enoxaparin (LOVENOX) injection 40 mg (40 mg Subcutaneous Given 01/21/24 1907)  alum & mag hydroxide-simeth (MAALOX/MYLANTA) 200-200-20 MG/5ML  suspension 15 mL (has no administration in time range)  pantoprazole (PROTONIX) EC tablet 40 mg (40 mg Oral Given 01/21/24 1907)  rosuvastatin (CRESTOR) tablet 20 mg (20 mg Oral Given 01/21/24 1907)  clopidogrel (PLAVIX) tablet 300 mg (300 mg Oral Given 01/21/24 1517)    Mobility walks  Focused Assessments Neuro Assessment Handoff:  Swallow screen pass? Yes  Cardiac Rhythm: Sinus bradycardia NIH Stroke Scale  Dizziness Present: No Headache Present: No Interval: Shift assessment Level of Consciousness (1a.)   : Alert, keenly responsive LOC Questions (1b. )   : Answers both questions correctly LOC Commands (1c. )   : Performs both tasks correctly Best Gaze (2. )  : Normal Visual (3. )  : No visual loss Facial Palsy (4. )    : Minor paralysis Motor Arm, Left (5a. )   : Drift Motor Arm, Right (5b. ) : No drift Motor Leg, Left (6a. )  : No drift Motor Leg, Right (6b. ) : Drift Limb Ataxia (7. ): Absent Sensory (8. )  : Normal, no sensory loss Best Language (9. )  : No aphasia Dysarthria (10. ): Normal Extinction/Inattention (11.)   : No Abnormality Complete NIHSS TOTAL: 3 Last date known well: 01/21/24 Last time known well: 0810 Neuro Assessment: Exceptions to WDL Neuro Checks:   Initial (By Neuro) (01/21/24 1228)  Has TPA been given? No If patient is a Neuro Trauma and patient is going to OR before floor call report to 4N Charge nurse: [phone] or [phone]   R Recommendations: See Admitting Provider Note  Report given to:   Additional Notes:

## 2024-01-21 NOTE — Consult Note (Signed)
 NEUROLOGY CONSULT NOTE   Date of service: January 21, 2024 Patient Name: [name] MRN:  [id number] DOB:  06/18/1956 Chief Complaint: "Code stroke" Requesting Provider: Rolan Bucco, MD  History of Present Illness  [name] is a 68 y.o. female with hx of CAD s/p CABG in 2015, Asthma, GERD, hyperlipidemia, HTN presenting with right facial droop. Patient is unsure when she was last normal, she went to church this morning and her daughter noticed that she had a facial droop at 10am. Her daughter states she noticed this when she first laid eyes on her at church. The patient herself does not remember noticing a facial droop when she got ready or looked in the mirror this morning. A friend from church then brought her to urgent care who sent her to [facility] ED as a code stroke. On exam she has a right facial droop and bilateral lower extremity weakness. Per EMS she was unsteady walking to the stretcher. She is bradycardic on arrival with HR 50, BP 157/74, glucose 111.   Pertinent ROS She does note that her metoprolol was recently restarted and she feels that this makes her feel unwell and is contributing to her symptoms significantly -- it was previously stopped due to low heart rates She also reports of some mild blood when she wipes but no frank blood in her stool Stable neuropathy in her feet which she manages with mechanical bands that relieve her symptoms Stable chest pain unchanged since her CABG  Due to unclear last known well and some mild aphasia with patient reporting severe allergy to contrast dye, decision was made to take her to emergent MRI to exclude any significant stroke/LVO Symptoms after MRI were nearly resolved with only very mild residual right facial droop, including ability to ambulate normally.  Therefore she was not treated with TNK   LKW: Unclear Modified rankin score: 0-Completely asymptomatic and back to baseline post- stroke IV Thrombolysis: No,  EVT: No, no  LVO  NIHSS components Score: Comment  1a Level of Conscious 0[x]  1[]  2[]  3[]      1b LOC Questions 0[x]  1[]  2[]       1c LOC Commands 0[x]  1[]  2[]       2 Best Gaze 0[x]  1[]  2[]       3 Visual 0[x]  1[]  2[]  3[]      4 Facial Palsy 0[]  1[x]  2[]  3[]      5a Motor Arm - left 0[x]  1[]  2[]  3[]  4[]  UN[]    5b Motor Arm - Right 0[x]  1[]  2[]  3[]  4[]  UN[]    6a Motor Leg - Left 0[]  1[x]  2[]  3[]  4[]  UN[]    6b Motor Leg - Right 0[]  1[x]  2[]  3[]  4[]  UN[]    7 Limb Ataxia 0[x]  1[]  2[]  3[]  UN[]     8 Sensory 0[x]  1[]  2[]  UN[]      9 Best Language 0[]  1[x]  2[]  3[]    Able to name all objects but halting speech and slow to respond at times, very mild aphasia  10 Dysarthria 0[x]  1[]  2[]  UN[]      11 Extinct. and Inattention 0[x]  1[]  2[]       TOTAL: 3      ROS  Comprehensive ROS performed and pertinent positives documented in HPI   Past History   Past Medical History:  Diagnosis Date   Acute urinary retention s/p Foley 01/07/2012   Allergy    Anemia    Asthma    CAD (coronary artery disease)    a. 12/2013 s/p CABG x 5 Dr.  VanTrigt (LIMA to LAD, SVG to diagonal, SVG to OM1, SVG to OM2, SVG to PDA)   Carotid arterial disease (HCC)    a. 12/2013 Carotid U/S: 1-39% bilat ICA stenosis.   Carpal tunnel syndrome, bilateral    GERD (gastroesophageal reflux disease)    Hemorrhoids, internal, with bleeding & prolapse 12/13/2011   Hernia, abdominal    History of blood transfusion    CABG and hysterectomy   History of kidney stones    Hyperlipidemia    Hypertension    Iron deficiency 12/07/2011   Keloid scar    a. Sternal Keloid s/p CABG with ongoing pain.   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    Past Surgical History:  Procedure Laterality Date   ARTERY BIOPSY Left 07/10/2021   Procedure: LEFT TEMPORAL ARTERY BIOPSY;  Surgeon: Nada Libman, MD;  Location: [facility] OR;  Service: Vascular;  Laterality: Left;   bladder tack     BREAST REDUCTION SURGERY Bilateral 10/10/2015   Procedure: BILATERAL MAMMARY REDUCTION  (BREAST) WITH FREE NIPPLE GRAFT;  Surgeon: Glenna Fellows, MD;  Location: [facility] OR;  Service: Plastics;  Laterality: Bilateral;   CARDIAC CATHETERIZATION     CATARACT EXTRACTION Right    COLONOSCOPY  2009   COLONOSCOPY W/ BIOPSIES AND POLYPECTOMY     CORONARY ARTERY BYPASS GRAFT N/A 01/09/2014   Procedure: CORONARY ARTERY BYPASS GRAFTING (CABG) x 5 using left internal mammary artery and right leg greater saphenous vein harvested endoscopically;  Surgeon: Kerin Perna, MD;  Location: [facility] OR;  Service: Open Heart Surgery;  Laterality: N/A;  please use bed extenders and breast binder   INTRAOPERATIVE TRANSESOPHAGEAL ECHOCARDIOGRAM N/A 01/09/2014   Procedure: INTRAOPERATIVE TRANSESOPHAGEAL ECHOCARDIOGRAM;  Surgeon: Kerin Perna, MD;  Location: [facility] OR;  Service: Open Heart Surgery;  Laterality: N/A;   KNEE ARTHROSCOPY  1991   KNEE SURGERY Bilateral 1999   LEFT HEART CATHETERIZATION WITH CORONARY ANGIOGRAM N/A 01/04/2014   Procedure: LEFT HEART CATHETERIZATION WITH CORONARY ANGIOGRAM;  Surgeon: Lesleigh Noe, MD;  Location: [facility] CATH LAB;  Service: Cardiovascular;  Laterality: N/A;   LESION EXCISION WITH COMPLEX REPAIR N/A 05/07/2016   Procedure: COMPLEX REPAIR OF CHEST 16 CM;  Surgeon: Glenna Fellows, MD;  Location: [facility] OR;  Service: Plastics;  Laterality: N/A;   MULTIPLE EXTRACTIONS WITH ALVEOLOPLASTY N/A 04/11/2014   Procedure: Extraction of tooth #'s 1,2,8,16 with alveoloplasty, maxillary tuberosity reductions, and gross debridement of remaining teeth.;  Surgeon: Charlynne Pander, DDS;  Location: [facility] OR;  Service: Oral Surgery;   Laterality: N/A;   NM MYOCAR PERF WALL MOTION  01/2010   dipyridamole myoview - moderate perfusion defect in basal inferoseptal, basal inferior, mid inferoseptal, mid inferior, apical inferior region; EF 56%   TEE WITHOUT CARDIOVERSION  01/2010   EF 60-65%, small, flat, non-infiltrating, calcified, fixed apical/septal mass   TOTAL KNEE ARTHROPLASTY Left 04/29/2011   transanal hemorrhoidal dearterliaization  01/06/2012   with external hemorrhoid removal   UNILATERAL SALPINGECTOMY Right 06/03/2014   Procedure: UNILATERAL SALPINGECTOMY;  Surgeon: Willodean Rosenthal, MD;  Location: [facility] ORS;  Service: Gynecology;  Laterality: Right;   VAGINAL HYSTERECTOMY N/A 06/03/2014   Procedure: HYSTERECTOMY VAGINAL;  Surgeon: Willodean Rosenthal, MD;  Location: [facility] ORS;  Service: Gynecology;  Laterality: N/A;   WISDOM TOOTH EXTRACTION      Family History: Family History  Problem Relation Age of Onset   Heart attack Mother 63   Heart disease Mother    Hypertension Mother    Diabetes Mother    Hypertension Sister    Heart failure Sister    Heart disease Sister    Diabetes Sister    Kidney disease Sister    Kidney disease Brother    Hypertension Daughter    Bipolar disorder Son    Schizophrenia Son    Anxiety disorder Son    Esophageal cancer Other        nephew   Colon cancer Neg Hx    Pancreatic cancer Neg Hx    Stomach cancer Neg Hx    Liver disease Neg Hx    Rectal cancer Neg Hx     Social History  reports that she has never smoked. She has never been exposed to tobacco smoke. She has never used smokeless tobacco. She reports that she does not drink alcohol and does not use drugs.  Allergies  Allergen Reactions   Cephalosporins Anaphylaxis, Swelling and Other (See Comments)    Tongue swelling, gum pain   Eicosapentaenoic Acid (Epa) (Fish) Anaphylaxis and Shortness Of Breath   Fish-Derived Products Anaphylaxis and Shortness Of Breath   Peanuts [Peanut Oil] Anaphylaxis, Shortness  Of Breath and Other (See Comments)    Peanut butter   Shrimp [Shellfish Allergy] Anaphylaxis    Pt states her throat will swell if she eats shrimp.   Latex Itching   Metronidazole Other (See Comments)    Palpitations, mild SOB, metallic taste, dry mouth, high blood pressure   Methotrexate Derivatives Nausea Only   Ciprofloxacin Other (See Comments)    Gaging and achy   Lisinopril Cough        Naproxen Nausea Only and Other (See Comments)    Headache   Other Itching and Other (See Comments)     All pain meds make her itch - has to have something to prevent that in addition to receiving med   Prednisone Other (See Comments)    Heart beating fast Ok to take low dosage      Medications  No current facility-administered medications for this encounter.  Current Outpatient Medications:    albuterol (VENTOLIN HFA) 108 (90 Base) MCG/ACT inhaler, Inhale 2 puffs into the lungs every 6 (six) hours as needed for wheezing or shortness of breath., Disp: 1 each, Rfl: 0   alum & mag hydroxide-simeth (MAALOX PLUS) 400-400-40 MG/5ML suspension, Take 15 mLs by mouth every 6 (six) hours as needed for indigestion., Disp: 355 mL, Rfl: 0   Cholecalciferol (VITAMIN D HIGH POTENCY) 25 MCG (1000 UT) capsule, Take 1,000 Units by mouth daily. (Patient not taking: Reported on 01/06/2024), Disp: , Rfl:    Clobetasol Prop Emollient Base (CLOBETASOL PROPIONATE E) 0.05 % emollient cream, Apply 1 Application topically at bedtime., Disp: 30 g, Rfl: 2   metoprolol tartrate (LOPRESSOR) 25 MG tablet, Take 1 tablet (25 mg total) by mouth 2 (two) times daily., Disp: 180 tablet, Rfl: 1   Multiple Vitamin (MULTIVITAMIN WITH MINERALS) TABS tablet, Take 1 tablet by mouth every Monday, Wednesday, and Friday., Disp: , Rfl:    nystatin ointment (MYCOSTATIN), Apply 1 Application topically 2 (two) times daily.  Apply to affected area for up to 7 days for skin irritation, Disp: 30 g, Rfl: 0   omeprazole (PRILOSEC) 20 MG capsule,  TAKE 1 CAPSULE BY MOUTH EVERY DAY BEFORE BREAKFAST, Disp: 90 capsule, Rfl: 1   Polyethyl Glycol-Propyl Glycol (SYSTANE FREE OP), Apply 1 tablet to eye daily as needed (For dry eyes)., Disp: , Rfl:    rosuvastatin (CRESTOR) 20 MG tablet, Take 1 tablet (20 mg total) by mouth daily., Disp: 90 tablet, Rfl: 3   senna-docusate (SENOKOT-S) 8.6-50 MG tablet, Take by mouth., Disp: , Rfl:    valsartan-hydrochlorothiazide (DIOVAN-HCT) 320-12.5 MG tablet, Take 1 tablet by mouth daily., Disp: 90 tablet, Rfl: 1   Vibegron 75 MG TABS, Take 1 tablet (75 mg total) by mouth daily., Disp: 30 tablet, Rfl: 5   Vitamin D, Ergocalciferol, (DRISDOL) 1.25 MG (50000 UNIT) CAPS capsule, Take 1 capsule (50,000 Units total) by mouth every 7 (seven) days., Disp: 4 capsule, Rfl: 1  Vitals   Vitals:   February 17, 2024 1235 2024-02-17 1243 02/17/2024 1330  BP:  (!) 157/83 (!) 161/86  Pulse:  (!) 55 (!) 53  Resp:   17  Temp:   (!) 97.3 F (36.3 C)  TempSrc:   Temporal  SpO2:  100% 100%  Weight: 128.6 kg      Body mass index is 43.11 kg/m.  Physical Exam   Constitutional: Appears well-developed and well-nourished.  Psych: Affect appropriate to situation.  Eyes: No scleral injection.  HENT: No OP obstruction.  Head: Normocephalic.  Cardiovascular: Normal rate and regular rhythm.  Respiratory: Effort normal, non-labored breathing.  GI: Soft.  No distension. There is no tenderness.  Skin: WDI.   Neurologic Examination   Neuro: Mental Status: Patient is awake, alert, oriented to person, place, month, year, and situation. Patient is able to give a clear and coherent history. No signs of aphasia or neglect Cranial Nerves: II: Visual Fields are full. Pupils are equal, round, and reactive to light.   III,IV, VI: EOMI without ptosis or diploplia.  V: Facial sensation is symmetric to temperature VII: Slight right facial asymmetry VIII: Hearing is intact to voice X: Palate elevates symmetrically XI: Shoulder shrug is  symmetric. XII: Tongue protrudes midline without atrophy or fasciculations.  Motor: Tone is normal. Bulk is normal. 5/5 strength was present in bilateral upper extremities  Slight drift noted in bilateral lower extremities  Sensory: Sensation is symmetric to light touch and temperature in the arms and legs.  Cerebellar: FNF and HKS are intact bilaterally Gait: Ambulating at her baseline gait steadily, slightly wide-based in the setting of being in hospital gown and likely commensurate with her age       Labs/Imaging/Neurodiagnostic studies   CBC:  Recent Labs  Lab 02-17-2024 1236 02-17-24 1237  WBC 5.5  --   NEUTROABS 2.8  --   HGB 12.5 12.6  HCT 37.6 37.0  MCV 86.0  --   PLT 197  --    Basic Metabolic Panel:  Lab Results  Component Value Date   NA 139 02-17-24   K 3.8 02/17/2024   CO2 26 02-17-24   GLUCOSE 107 (H) 02-17-24   BUN 15 February 17, 2024   CREATININE 0.90 02/17/2024   CALCIUM 9.1 02-17-2024   GFRNONAA >60 02-17-2024   GFRAA 99 07/14/2020   Lipid Panel:  Lab Results  Component Value Date   LDLCALC 122 (H) 12/16/2023   HgbA1c:  Lab Results  Component Value Date   HGBA1C 4.9 12/16/2023   Urine Drug Screen:  No results found for: "LABOPIA", "COCAINSCRNUR", "LABBENZ", "AMPHETMU", "THCU", "LABBARB"   Alcohol Level     Component Value Date/Time   ETH <10 01/21/2024 1236   INR  Lab Results  Component Value Date   INR 1.0 01/21/2024   APTT  Lab Results  Component Value Date   APTT 29 01/21/2024   AED levels: No results found for: "PHENYTOIN", "ZONISAMIDE", "LAMOTRIGINE", "LEVETIRACETA"  CT Head without contrast(Personally reviewed): 1. No acute finding. 2. Mild chronic small vessel disease.  MR Angio head without contrast and Carotid Duplex BL(Personally reviewed): 1. No large vessel occlusion. 2. Moderate focal stenosis in the distal left M1 segment. 3. Moderate stenosis of the proximal right P2 segment. 4. Atherosclerotic irregularity  within the cavernous internal carotid arteries bilaterally without a significant stenosis through the ICA termini.  MRI Brain(Personally reviewed): 1. 13 mm acute nonhemorrhagic infarct of the left internal capsule. 2. Periventricular T2 hyperintensities are moderately advanced for age. This likely reflects the sequela of chronic microvascular ischemia.  ASSESSMENT   [name] is a 67 y.o. female with a past medical history of CAD s/p CABG in 2015, Asthma, GERD, hyperlipidemia, HTN presenting with right facial droop. Patient is unsure when she was last normal, she went to church this morning and her daughter noticed that she had a facial droop at 10am. Her daughter states she noticed this when she first laid eyes on her at church. Sent from UC to [facility] ED as a code stroke. On exam she has a right facial droop as well as some mild confusion/aphasia and bilateral lower extremity weakness. She is bradycardic on arrival with HR 50, BP 157/74, glucose 111. MRI completed due to unknown LKW.  MRA ordered due to shellfish allergy; patient reported she could not tolerate CTA due to the severity of her shellfish allergy and concern for cross reaction with iodine.  All symptoms were much improved on reevaluation after MRI was completed except for mild persistent right facial droop (too mild to treat)  RECOMMENDATIONS  - Emergent MRI brain and MRA head obtained  - HgbA1c, fasting lipid panel - Frequent neuro checks - Echocardiogram - Carotid dopplers - Prophylactic therapy-Antiplatelet med: ASA 81mg  and plavix 75mg  daily (course to be determined by stroke team), 300 milligram loading dose of Plavix - Risk factor modification - Telemetry monitoring - PT consult, OT consult, Speech consult - Stroke team to follow  ______________________________________________________________________    Signed, Elmer Picker, NP [facility]  Attending Neurologist's note:  I personally saw this  patient, gathering history, performing a full neurologic examination, reviewing relevant labs, personally reviewing relevant imaging including head CT, MRI brain, and formulated the assessment and plan, adding the note above for completeness and clarity to accurately reflect my thoughts  Brooke Dare MD-PhD [facility] [phone] Available 7 AM to 7 PM, outside these hours please contact Neurologist on call listed on AMION   CRITICAL CARE Performed by: [name]   Total critical care time: 45 minutes  Critical care time was exclusive of separately billable procedures and treating other patients.  Critical care was necessary to treat or prevent imminent or life-threatening deterioration.  Critical care was time spent personally by me on the following activities: development of treatment plan with patient and/or surrogate as well as nursing, discussions with consultants, evaluation of patient's response to treatment, examination of patient, obtaining history from patient or surrogate, ordering and performing treatments and interventions, ordering and review of laboratory studies, ordering and review of radiographic  studies, pulse oximetry and re-evaluation of patient's condition.

## 2024-01-21 NOTE — H&P (Signed)
 Date: 01/21/2024               Patient Name:  [name] MRN: [id number]  DOB: 02-Sep-1956 Age / Sex: 68 y.o., female   PCP: Tyson Alias, MD         Medical Service: Internal Medicine Teaching Service         Attending Physician: Dr. Gust Rung, DO      First Contact: Dr. Katheran James, DO Pager [phone]    Second Contact: Dr. Rudene Christians, DO Pager [phone]         After Hours (After 5p/  First Contact Pager: [phone]  weekends / holidays): Second Contact Pager: [phone]   SUBJECTIVE   Chief Complaint: Right facial droop  History of Present Illness:  [name] is a 68 year old with past medical history of CAD status post CABG, HTN, GERD, history of uterine cancer who presents right facial droop.  She woke up this morning and felt normal except for she was moving a little bit slower.  She went to church and sat through a service.  After that there was a small group meeting during that her daughter noticed that she had some right-sided facial droop.  She initially went to an urgent care and then was told to go to the emergency room.  Code stroke activated in ED.  ED Course: Bradycardic with heart rates in the 50s.  Hypertensive systolics 138-161.  Sugar within normal limits  CT head showed no acute deficits.  MRI showed 13 mm acute nonhemorrhagic infarct of the left internal capsule.   IMTS called for admission in setting of acute stroke.  Past Medical History CAD status post CABG Hypertension GERD History of uterine cancer   Past Surgical History:  Procedure Laterality Date   ARTERY BIOPSY Left 07/10/2021   Procedure: LEFT TEMPORAL ARTERY BIOPSY;  Surgeon: Nada Libman, MD;  Location: [facility] OR;  Service: Vascular;  Laterality: Left;   bladder tack     BREAST REDUCTION SURGERY Bilateral 10/10/2015   Procedure: BILATERAL MAMMARY REDUCTION  (BREAST) WITH FREE NIPPLE GRAFT;  Surgeon: Glenna Fellows, MD;  Location: [facility] OR;  Service: Plastics;   Laterality: Bilateral;   CARDIAC CATHETERIZATION     CATARACT EXTRACTION Right    COLONOSCOPY  2009   COLONOSCOPY W/ BIOPSIES AND POLYPECTOMY     CORONARY ARTERY BYPASS GRAFT N/A 01/09/2014   Procedure: CORONARY ARTERY BYPASS GRAFTING (CABG) x 5 using left internal mammary artery and right leg greater saphenous vein harvested endoscopically;  Surgeon: Kerin Perna, MD;  Location: [facility] OR;  Service: Open Heart Surgery;  Laterality: N/A;  please use bed extenders and breast binder   INTRAOPERATIVE TRANSESOPHAGEAL ECHOCARDIOGRAM N/A 01/09/2014   Procedure: INTRAOPERATIVE TRANSESOPHAGEAL ECHOCARDIOGRAM;  Surgeon: Kerin Perna, MD;  Location: [facility] OR;  Service: Open Heart Surgery;  Laterality: N/A;   KNEE ARTHROSCOPY  1991   KNEE SURGERY Bilateral 1999   LEFT HEART CATHETERIZATION WITH CORONARY ANGIOGRAM N/A 01/04/2014   Procedure: LEFT HEART CATHETERIZATION WITH CORONARY ANGIOGRAM;  Surgeon: Lesleigh Noe, MD;  Location: [facility] CATH LAB;  Service: Cardiovascular;  Laterality: N/A;   LESION EXCISION WITH COMPLEX REPAIR N/A 05/07/2016   Procedure: COMPLEX REPAIR OF CHEST 16 CM;  Surgeon: Glenna Fellows, MD;  Location: [facility] OR;  Service: Plastics;  Laterality: N/A;   MULTIPLE EXTRACTIONS WITH ALVEOLOPLASTY N/A 04/11/2014   Procedure: Extraction of tooth #'s 1,2,8,16 with alveoloplasty, maxillary tuberosity reductions, and gross debridement of remaining teeth.;  Surgeon:  Charlynne Pander, DDS;  Location: [facility] OR;  Service: Oral Surgery;  Laterality: N/A;   NM MYOCAR PERF WALL MOTION  01/2010   dipyridamole myoview - moderate perfusion defect in basal inferoseptal, basal inferior, mid inferoseptal, mid inferior, apical inferior region; EF 56%   TEE WITHOUT CARDIOVERSION  01/2010   EF 60-65%, small, flat, non-infiltrating, calcified, fixed apical/septal mass   TOTAL KNEE ARTHROPLASTY Left 04/29/2011   transanal hemorrhoidal dearterliaization  01/06/2012   with external hemorrhoid removal   UNILATERAL  SALPINGECTOMY Right 06/03/2014   Procedure: UNILATERAL SALPINGECTOMY;  Surgeon: Willodean Rosenthal, MD;  Location: [facility] ORS;  Service: Gynecology;  Laterality: Right;   VAGINAL HYSTERECTOMY N/A 06/03/2014   Procedure: HYSTERECTOMY VAGINAL;  Surgeon: Willodean Rosenthal, MD;  Location: [facility] ORS;  Service: Gynecology;  Laterality: N/A;   WISDOM TOOTH EXTRACTION      Meds:  Albuterol inhaler Maalox Amlodipine 5 mg-not taking Metoprolol tartrate 50 mg twice daily Aspirin 81 mg Vitamin D Losartan 100 mg Omeprazole 20 mg Rosuvastatin 20 mg Vibegron 75 mg Valsartan/hydrochlorothiazide 320-12.5 mg every day- not taking   Social:  Lives With: Alone, daughter lives in area Level of Function: independent in IADLs PCP: [facility] Substances: She does not smoke drink or use drugs  Family History: Mother had history of stroke and heart attacks, he is not sure what age this happened  Allergies: Allergies as of 01/21/2024 - Review Complete 01/21/2024  Allergen Reaction Noted   Cephalosporins Anaphylaxis, Swelling, and Other (See Comments) 06/17/2012   Eicosapentaenoic acid (epa) (fish) Anaphylaxis and Shortness Of Breath 12/16/2015   Fish-derived products Anaphylaxis and Shortness Of Breath 03/21/2015   Peanuts [peanut oil] Anaphylaxis, Shortness Of Breath, and Other (See Comments) 03/21/2015   Shrimp [shellfish allergy] Anaphylaxis 01/19/2017   Latex Itching    Metronidazole Other (See Comments) 08/01/2015   Methotrexate derivatives Nausea Only 10/19/2022   Ciprofloxacin Other (See Comments) 09/28/2014   Lisinopril Cough 06/19/2015   Naproxen Nausea Only and Other (See Comments) 06/19/2015   Other Itching and Other (See Comments) 05/05/2016   Prednisone Other (See Comments) 06/11/2012    Review of Systems: A complete ROS was negative except as per HPI.   OBJECTIVE:   Physical Exam: Blood pressure (!) 157/88, pulse (!) 53, temperature (!) 97.3 F (36.3 C),  temperature source Temporal, resp. rate 14, weight 128.6 kg, last menstrual period 01/13/2014, SpO2 100%.  Constitutional: well-appearing, in no acute distress Cardiovascular: regular rate and rhythm, no m/r/g Pulmonary/Chest: normal work of breathing on room air, lungs clear to auscultation bilaterally MSK: normal bulk and tone Neurological:  Mental Status: Patient is awake, alert, oriented x3 No signs of aphasia or neglect Cranial Nerves: II: Pupils equal, round, and reactive to light.   III,IV, VI: EOMI without ptosis or diploplia.  V: Facial sensation is symmetric to light touch and temperature. VII: Facial movement is symmetric.  VIII: Hearing is intact to voice X: Uvula elevates symmetrically XI: Shoulder shrug is symmetric. XII: Tongue is midline without atrophy or fasciculations.  Motor: good effort thorughout, at least 5/5 bilateral UE, 5/5 bilateral LE Sensory: Sensation is grossly intact, bilateral UE & LE Cerebellar: Finger-Nose and Heel-Shin  intact bilaterally   Labs: CBC    Component Value Date/Time   WBC 5.5 01/21/2024 1236   RBC 4.37 01/21/2024 1236   HGB 12.6 01/21/2024 1237   HGB 13.6 01/11/2022 0928   HCT 37.0 01/21/2024 1237   HCT 42.8 01/11/2022 0928   PLT 197 01/21/2024 1236  PLT 234 01/11/2022 0928   MCV 86.0 01/21/2024 1236   MCV 88 01/11/2022 0928   MCH 28.6 01/21/2024 1236   MCHC 33.2 01/21/2024 1236   RDW 14.9 01/21/2024 1236   RDW 14.6 01/11/2022 0928   LYMPHSABS 2.0 01/21/2024 1236   MONOABS 0.5 01/21/2024 1236   EOSABS 0.1 01/21/2024 1236   BASOSABS 0.0 01/21/2024 1236     CMP     Component Value Date/Time   NA 139 01/21/2024 1237   NA 138 01/11/2022 0928   K 3.8 01/21/2024 1237   CL 106 01/21/2024 1237   CO2 26 01/21/2024 1236   GLUCOSE 107 (H) 01/21/2024 1237   BUN 15 01/21/2024 1237   BUN 9 01/11/2022 0928   CREATININE 0.90 01/21/2024 1237   CREATININE 0.76 04/26/2023 1409   CALCIUM 9.1 01/21/2024 1236   PROT 7.0  01/21/2024 1236   PROT 7.2 10/16/2018 1020   ALBUMIN 3.3 (L) 01/21/2024 1236   ALBUMIN 4.1 10/16/2018 1020   AST 16 01/21/2024 1236   ALT 10 01/21/2024 1236   ALKPHOS 33 (L) 01/21/2024 1236   BILITOT 0.3 01/21/2024 1236   BILITOT 0.5 10/16/2018 1020   GFRNONAA >60 01/21/2024 1236   GFRNONAA 88 02/27/2015 1206   GFRAA 99 07/14/2020 0859   GFRAA >89 02/27/2015 1206   Lab Results  Component Value Date   HGBA1C 4.9 12/16/2023   Lab Results  Component Value Date   CHOL 193 12/16/2023   HDL 55 12/16/2023   LDLCALC 122 (H) 12/16/2023   TRIG 88 12/16/2023   CHOLHDL 3.5 12/16/2023   Imaging: CT head No acute finding, mild chronic small vessel disease  MRI brain without contrast 13 mm acute nonhemorrhagic infarct of the left internal capsule  MRA head IMPRESSION: 1. No large vessel occlusion. 2. Moderate focal stenosis in the distal left M1 segment. 3. Moderate stenosis of the proximal right P2 segment. 4. Atherosclerotic irregularity within the cavernous internal carotid arteries bilaterally without a significant stenosis through the ICA termini.  EKG: Sinus bradycardia with PVC  ASSESSMENT & PLAN:   Assessment & Plan by Problem: Principal Problem:   Acute CVA (cerebrovascular accident) (HCC)   AVALEEN BROWNLEY is a 68 y.o. person living with a history of CAD status post CABG, hypertension, hyperlipidemia who presented with sided facial droop and admitted for acute ischemic stroke of left internal capsule on hospital day 0  Acute ischemic stroke of left internal capsule Hyperlipidemia She states that people at church noticed symptoms around 1030 this morning.  MRI showed a 13 mm infarct of the left internal capsule.  It appears that symptoms have improved since initial presentation.  I do not appreciate a facial droop or lower extremity weakness.  In terms of risk factors for stroke she does not smoke, LDL in January elevated at 122, A1C well-controlled less than 5 in  January.  Will continue stroke workup and hopefully be able to discharge her tomorrow.  -Echocardiogram -Carotid Dopplers -Frequent neurochecks -Repeat lipid panel tomorrow morning, last LDL 122 in January -Last A1c less than 5 in January -Start aspirin 81 mg and Plavix 75 mg -Continue home Crestor 20 mg, refill history reflects nonadherence -Med/tele -PT/OT/SLP consult -Appreciate stroke team recommendations  Bradycardia History of SVT She was restarted on metoprolol 25 mg twice daily at cardiology visit February 14.  She is bradycardic into the 40s and 50s while here but it sounds like she is asymptomatic.  -Discontinue beta-blocker  Hypertension She was seen in January  at [facility].  At that time she was bradycardic and blood pressures were not well-controlled.  Medications were switched to start amlodipine 5 mg and continue losartan.  Metoprolol tartrate was discontinued.  She followed up with cardiology February 14 with recent ED visits for SVT.  At that visit amlodipine was discontinued as she noticed some constipation and lower extremity swelling.  He was switched from losartan to combination pill with valsartan and hydrochlorothiazide, which she has not been taking.  She reports that in the past she did not tolerate hydrochlorothiazide and it made urinary incontinence worse.  She was restarted on metoprolol tartrate 25 mg twice daily at cardiology visit.  -Plan to start losartan as able after 48 hours following stroke  GERD -Home medications continued with Maalox as needed and PPI  Chronic stable conditions: Urinary incontinence- vibegron continued  Diet: Normal VTE: Enoxaparin IVF: None,None Code: Full, daughter Chaya Jan is her decision maker  Prior to Admission Living Arrangement: Home, living alone Anticipated Discharge Location: Home Barriers to Discharge: Stroke workup  Dispo: Admit patient to Observation with expected length of stay less than 2  midnights.  Signed: Rudene Christians, DO Internal Medicine Resident PGY-3  01/21/2024, 4:11 PM

## 2024-01-21 NOTE — ED Notes (Signed)
 Pt at MRI with RNs

## 2024-01-21 NOTE — ED Notes (Signed)
 Called CCMD.

## 2024-01-21 NOTE — ED Triage Notes (Signed)
 Pt BIB [facility] from [facility] as Code Stroke. LKW [id number]. Right sided facial droop. Per EMS unsteady gait but ambulatory to stretcher.   110/60s 50s HR with PVCs and PACs 989% RA 126 CBG  HX  HTN / CAD

## 2024-01-21 NOTE — ED Triage Notes (Addendum)
 Patient states she was at church and became fatigued and was told she had left sided facial and eye drooping. Denis dizziness or chest pain at this time. Patient does states she had tingling on her right side of her face but now has resolved. Patient states the symptoms stared about 1 hr ago.

## 2024-01-21 NOTE — ED Notes (Signed)
 Attempted to call charge RN for report x 2. EMS at bedside.

## 2024-01-21 NOTE — ED Provider Notes (Signed)
 [facility]    CSN: [id number] Arrival date & time: 01/21/24  1132      History   Chief Complaint Chief Complaint  Patient presents with   Fatigue    HPI [name] is a 68 y.o. female.   Patient presents to clinic over concerns of right sided facial drooping.  She was at church earlier when she suddenly became fatigued and her friends noticed that her face was drooping. Onset approximately 1 hour ago. A friend drove her to the urgent care.   Patient endorses history of stroke, upon review of medical records does not appear to have history of stroke.  She is not anticoagulated.    The history is provided by the patient and medical records.    Past Medical History:  Diagnosis Date   Acute urinary retention s/p Foley 01/07/2012   Allergy    Anemia    Asthma    CAD (coronary artery disease)    a. 12/2013 s/p CABG x 5 Dr. Maren Beach (LIMA to LAD, SVG to diagonal, SVG to OM1, SVG to OM2, SVG to PDA)   Carotid arterial disease (HCC)    a. 12/2013 Carotid U/S: 1-39% bilat ICA stenosis.   Carpal tunnel syndrome, bilateral    GERD (gastroesophageal reflux disease)    Hemorrhoids, internal, with bleeding & prolapse 12/13/2011   Hernia, abdominal    History of blood transfusion    CABG and hysterectomy   History of kidney stones    Hyperlipidemia    Hypertension    Iron deficiency 12/07/2011   Keloid scar    a. Sternal Keloid s/p CABG with ongoing pain.   Morbid obesity (HCC)    Myocardial infarct (HCC)    2015   Neuropathy    Osteoarthritis    Osteoporosis    PONV (postoperative nausea and vomiting)    Seasonal allergies    Sleep apnea    no CPAP machine; sleep study 02/2010 REM AHI 61.7/hr, total sleep REM 14.8/hr   Tooth abscess    Uterine cancer (HCC) 05/2014   clinical stage IA grade 1 endometrioid endometrial cancer    Patient Active Problem List   Diagnosis Date Noted   Other supraventricular tachycardia (HCC) 12/16/2023   Hearing loss of both  ears due to cerumen impaction [H61.23] 05/27/2023   Polyarthritis with positive rheumatoid factor (HCC) 03/31/2022   High risk medication use 03/31/2022   Tendinitis of both rotator cuffs 09/07/2021   Keloid scar of skin 09/07/2021   Pes planus of left foot 09/08/2020   Hallux valgus 09/08/2020   Glucose intolerance (impaired glucose tolerance) 07/14/2020   Vitamin D deficiency 07/14/2020   Depression 10/16/2018   Osteoarthritis of left shoulder 10/24/2017   Peripheral neuropathy 08/19/2017   Atherosclerosis of aorta (HCC) 06/20/2015   Osteoarthritis of right knee 11/07/2014   Health care maintenance 10/09/2014   Coronary atherosclerosis of native coronary artery 01/05/2014   Morbid obesity due to excess calories (HCC) 05/10/2012   Obstructive sleep apnea 03/27/2010   GERD 08/06/2008   Hyperlipidemia 10/31/2007   Essential hypertension 06/30/2007    Past Surgical History:  Procedure Laterality Date   ARTERY BIOPSY Left 07/10/2021   Procedure: LEFT TEMPORAL ARTERY BIOPSY;  Surgeon: Nada Libman, MD;  Location: [facility] OR;  Service: Vascular;  Laterality: Left;   bladder tack     BREAST REDUCTION SURGERY Bilateral 10/10/2015   Procedure: BILATERAL MAMMARY REDUCTION  (BREAST) WITH FREE NIPPLE GRAFT;  Surgeon: Glenna Fellows, MD;  Location:  [facility] OR;  Service: Plastics;  Laterality: Bilateral;   CARDIAC CATHETERIZATION     CATARACT EXTRACTION Right    COLONOSCOPY  2009   COLONOSCOPY W/ BIOPSIES AND POLYPECTOMY     CORONARY ARTERY BYPASS GRAFT N/A 01/09/2014   Procedure: CORONARY ARTERY BYPASS GRAFTING (CABG) x 5 using left internal mammary artery and right leg greater saphenous vein harvested endoscopically;  Surgeon: Kerin Perna, MD;  Location: [facility] OR;  Service: Open Heart Surgery;  Laterality: N/A;  please use bed extenders and breast binder   INTRAOPERATIVE TRANSESOPHAGEAL ECHOCARDIOGRAM N/A 01/09/2014   Procedure: INTRAOPERATIVE TRANSESOPHAGEAL ECHOCARDIOGRAM;  Surgeon: Kerin Perna, MD;  Location: [facility] OR;  Service: Open Heart Surgery;  Laterality: N/A;   KNEE ARTHROSCOPY  1991   KNEE SURGERY Bilateral 1999   LEFT HEART CATHETERIZATION WITH CORONARY ANGIOGRAM N/A 01/04/2014   Procedure: LEFT HEART CATHETERIZATION WITH CORONARY ANGIOGRAM;  Surgeon: Lesleigh Noe, MD;  Location: [facility] CATH LAB;  Service: Cardiovascular;  Laterality: N/A;   LESION EXCISION WITH COMPLEX REPAIR N/A 05/07/2016   Procedure: COMPLEX REPAIR OF CHEST 16 CM;  Surgeon: Glenna Fellows, MD;  Location: [facility] OR;  Service: Plastics;  Laterality: N/A;   MULTIPLE EXTRACTIONS WITH ALVEOLOPLASTY N/A 04/11/2014   Procedure: Extraction of tooth #'s 1,2,8,16 with alveoloplasty, maxillary tuberosity reductions, and gross debridement of remaining teeth.;  Surgeon: Charlynne Pander, DDS;  Location: [facility] OR;  Service: Oral Surgery;  Laterality: N/A;   NM MYOCAR PERF WALL MOTION  01/2010   dipyridamole myoview - moderate perfusion defect in basal inferoseptal, basal inferior, mid inferoseptal, mid inferior, apical inferior region; EF 56%   TEE WITHOUT CARDIOVERSION  01/2010   EF 60-65%, small, flat, non-infiltrating, calcified, fixed apical/septal mass   TOTAL KNEE ARTHROPLASTY Left 04/29/2011   transanal hemorrhoidal dearterliaization  01/06/2012   with external hemorrhoid removal   UNILATERAL SALPINGECTOMY Right 06/03/2014   Procedure: UNILATERAL SALPINGECTOMY;  Surgeon: Willodean Rosenthal, MD;  Location: [facility] ORS;  Service: Gynecology;  Laterality: Right;   VAGINAL HYSTERECTOMY N/A 06/03/2014   Procedure: HYSTERECTOMY VAGINAL;  Surgeon: Willodean Rosenthal, MD;  Location: [facility] ORS;  Service: Gynecology;  Laterality: N/A;   WISDOM TOOTH EXTRACTION      OB History     Gravida  2   Para  2   Term  2   Preterm  0   AB  0   Living  2      SAB  0   IAB  0   Ectopic  0   Multiple  0   Live Births  2            Home Medications    Prior to Admission medications   Medication  Sig Start Date End Date Taking? Authorizing Provider  albuterol (VENTOLIN HFA) 108 (90 Base) MCG/ACT inhaler Inhale 2 puffs into the lungs every 6 (six) hours as needed for wheezing or shortness of breath. 12/28/22   Tyson Alias, MD  alum & mag hydroxide-simeth (MAALOX PLUS) 400-400-40 MG/5ML suspension Take 15 mLs by mouth every 6 (six) hours as needed for indigestion. 11/03/22   Mesner, Barbara Cower, MD  Cholecalciferol (VITAMIN D HIGH POTENCY) 25 MCG (1000 UT) capsule Take 1,000 Units by mouth daily. Patient not taking: Reported on 01/06/2024    [provider]  Clobetasol Prop Emollient Base (CLOBETASOL PROPIONATE E) 0.05 % emollient cream Apply 1 Application topically at bedtime. 06/22/23   Selmer Dominion, NP  metoprolol tartrate (LOPRESSOR) 25 MG tablet Take  1 tablet (25 mg total) by mouth 2 (two) times daily. 01/06/24 04/05/24  Chrystie Nose, MD  Multiple Vitamin (MULTIVITAMIN WITH MINERALS) TABS tablet Take 1 tablet by mouth every Monday, Wednesday, and Friday.    [provider]  nystatin ointment (MYCOSTATIN) Apply 1 Application topically 2 (two) times daily. Apply to affected area for up to 7 days for skin irritation 09/30/23   Selmer Dominion, NP  omeprazole (PRILOSEC) 20 MG capsule TAKE 1 CAPSULE BY MOUTH EVERY DAY BEFORE BREAKFAST 07/13/23   Meredith Pel, NP  Polyethyl Glycol-Propyl Glycol (SYSTANE FREE OP) Apply 1 tablet to eye daily as needed (For dry eyes).    [provider]  rosuvastatin (CRESTOR) 20 MG tablet Take 1 tablet (20 mg total) by mouth daily. 12/16/23   Modena Slater, DO  senna-docusate (SENOKOT-S) 8.6-50 MG tablet Take by mouth. 07/21/14   [provider]  valsartan-hydrochlorothiazide (DIOVAN-HCT) 320-12.5 MG tablet Take 1 tablet by mouth daily. 01/06/24   Hilty, Lisette Abu, MD  Vibegron 75 MG TABS Take 1 tablet (75 mg total) by mouth daily. 10/31/23   Selmer Dominion, NP  Vitamin D, Ergocalciferol, (DRISDOL) 1.25 MG (50000 UNIT)  CAPS capsule Take 1 capsule (50,000 Units total) by mouth every 7 (seven) days. 12/19/23 02/17/24  Modena Slater, DO    Family History Family History  Problem Relation Age of Onset   Heart attack Mother 42   Heart disease Mother    Hypertension Mother    Diabetes Mother    Hypertension Sister    Heart failure Sister    Heart disease Sister    Diabetes Sister    Kidney disease Sister    Kidney disease Brother    Hypertension Daughter    Bipolar disorder Son    Schizophrenia Son    Anxiety disorder Son    Esophageal cancer Other        nephew   Colon cancer Neg Hx    Pancreatic cancer Neg Hx    Stomach cancer Neg Hx    Liver disease Neg Hx    Rectal cancer Neg Hx     Social History Social History   Tobacco Use   Smoking status: Never    Passive exposure: Never   Smokeless tobacco: Never  Vaping Use   Vaping status: Never Used  Substance Use Topics   Alcohol use: No    Alcohol/week: 0.0 standard drinks of alcohol   Drug use: No     Allergies   Cephalosporins, Eicosapentaenoic acid (epa) (fish), Fish-derived products, Peanuts [peanut oil], Shrimp [shellfish allergy], Latex, Metronidazole, Methotrexate derivatives, Ciprofloxacin, Lisinopril, Naproxen, Other, and Prednisone   Review of Systems Review of Systems  Per HPI   Physical Exam Triage Vital Signs ED Triage Vitals  Encounter Vitals Group     BP 01/21/24 1137 138/69     Systolic BP Percentile --      Diastolic BP Percentile --      Pulse Rate 01/21/24 1137 60     Resp 01/21/24 1137 18     Temp 01/21/24 1137 98 F (36.7 C)     Temp Source 01/21/24 1137 Oral     SpO2 01/21/24 1137 (!) 2 %     Weight --      Height --      Head Circumference --      Peak Flow --      Pain Score 01/21/24 1142 0     Pain Loc --  Pain Education --      Exclude from Growth Chart --    No data found.  Updated Vital Signs BP 138/69 (BP Location: Left Arm)   Pulse 60   Temp 98 F (36.7 C) (Oral)   Resp 18    LMP 01/13/2014 Comment: spotting in Feb and bleeding since April  SpO2 98%   Visual Acuity Right Eye Distance:   Left Eye Distance:   Bilateral Distance:    Right Eye Near:   Left Eye Near:    Bilateral Near:     Physical Exam Vitals and nursing note reviewed.  HENT:     Head: Normocephalic.     Right Ear: External ear normal.     Left Ear: External ear normal.     Nose: Nose normal.     Mouth/Throat:     Mouth: Mucous membranes are moist.  Eyes:     Conjunctiva/sclera: Conjunctivae normal.  Cardiovascular:     Rate and Rhythm: Normal rate and regular rhythm.     Heart sounds: Normal heart sounds. No murmur heard. Pulmonary:     Effort: Pulmonary effort is normal. No respiratory distress.     Breath sounds: Normal breath sounds.  Musculoskeletal:        General: Normal range of motion.  Skin:    General: Skin is warm and dry.  Neurological:     Mental Status: She is alert and oriented to person, place, and time.     GCS: GCS eye subscore is 4. GCS verbal subscore is 5. GCS motor subscore is 6.     Cranial Nerves: Cranial nerve deficit and facial asymmetry present.     Comments: Significant right sided facial droop.  No dysarthria.  Strength 5 out of 5 in upper extremities. No aphasia.   Psychiatric:        Mood and Affect: Mood normal.        Behavior: Behavior is cooperative.      UC Treatments / Results  Labs (all labs ordered are listed, but only abnormal results are displayed) Labs Reviewed  POCT FASTING CBG [facility] MANUAL ENTRY - Abnormal; Notable for the following components:      Result Value   POCT Glucose ([facility]) 126 (*)    All other components within normal limits    EKG   Radiology No results found.  Procedures Procedures (including critical care time)  Medications Ordered in UC Medications - No data to display  Initial Impression / Assessment and Plan / UC Course  I have reviewed the triage vital signs and the nursing notes.  Pertinent labs  & imaging results that were available during my care of the patient were reviewed by me and considered in my medical decision making (see chart for details).  Vitals and triage reviewed, patient with significant right-sided facial droop.  No dysarthria.  Strength 5 out of 5 in upper extremities.  No aphasia.  Symptoms occurred approximately 1 hour ago, concern for stroke.  [facility]O + and [facility] contacted to transport to nearest emergency department for further advanced evaluation. EKG shows sinus bradycardia with occasional PVCs, no ST elevation or ST depression.  CBG unremarkable.  Nursing staff placed 18-gauge in the right AC.  [facility] to transport at 12:20pm to [facility] ED.      Final Clinical Impressions(s) / UC Diagnoses   Final diagnoses:  Facial droop   Discharge Instructions   None    ED Prescriptions   None    PDMP not  reviewed this encounter.   Noa Galvao, [place], [place] 01/21/24 1225

## 2024-01-21 NOTE — ED Notes (Signed)
 Decision to go to MRI

## 2024-01-22 ENCOUNTER — Other Ambulatory Visit (HOSPITAL_COMMUNITY)

## 2024-01-22 DIAGNOSIS — I639 Cerebral infarction, unspecified: Secondary | ICD-10-CM | POA: Diagnosis not present

## 2024-01-22 DIAGNOSIS — I6602 Occlusion and stenosis of left middle cerebral artery: Secondary | ICD-10-CM

## 2024-01-22 DIAGNOSIS — R002 Palpitations: Secondary | ICD-10-CM

## 2024-01-22 DIAGNOSIS — E785 Hyperlipidemia, unspecified: Secondary | ICD-10-CM | POA: Diagnosis not present

## 2024-01-22 DIAGNOSIS — I6389 Other cerebral infarction: Secondary | ICD-10-CM | POA: Diagnosis not present

## 2024-01-22 DIAGNOSIS — I6381 Other cerebral infarction due to occlusion or stenosis of small artery: Secondary | ICD-10-CM | POA: Diagnosis not present

## 2024-01-22 LAB — HIV ANTIBODY (ROUTINE TESTING W REFLEX): HIV Screen 4th Generation wRfx: NONREACTIVE

## 2024-01-22 LAB — LIPID PANEL
Cholesterol: 170 mg/dL (ref 0–200)
HDL: 41 mg/dL (ref >40)
LDL Cholesterol: 114 mg/dL — ABNORMAL HIGH (ref 0–99)
Total CHOL/HDL Ratio: 4.1 ratio
Triglycerides: 77 mg/dL (ref <150)
VLDL: 15 mg/dL (ref 0–40)

## 2024-01-22 LAB — RAPID URINE DRUG SCREEN, HOSP PERFORMED
Amphetamines: NOT DETECTED
Barbiturates: NOT DETECTED
Benzodiazepines: NOT DETECTED
Cocaine: NOT DETECTED
Opiates: NOT DETECTED
Tetrahydrocannabinol: NOT DETECTED

## 2024-01-22 MED ORDER — ROSUVASTATIN CALCIUM 20 MG PO TABS
40.0000 mg | ORAL_TABLET | Freq: Every day | ORAL | Status: DC
  Administered 2024-01-23: 40 mg via ORAL
  Filled 2024-01-22: qty 2

## 2024-01-22 NOTE — Plan of Care (Signed)

## 2024-01-22 NOTE — Discharge Summary (Signed)
 Name: [name] MRN: [id number] DOB: 12-10-1955 68 y.o. PCP: Tyson Alias, MD  Date of Admission: 01/21/2024 12:36 PM Date of Discharge: 01/23/24 Attending Physician: Dr. Cleda Daub  Discharge Diagnosis: Principal Problem:   Acute CVA (cerebrovascular accident) [facility])    Discharge Medications: Allergies as of 01/23/2024       Reactions   Cephalosporins Anaphylaxis, Swelling, Other (See Comments)   Tongue swelling Gumpain   Eicosapentaenoic Acid (epa) (fish) Anaphylaxis, Shortness Of Breath   Fish-derived Products Anaphylaxis, Shortness Of Breath   Flagyl [metronidazole] Shortness Of Breath, Palpitations, Other (See Comments), Hypertension   Metallic taste Dry Mouth   Peanuts [peanut Oil] Anaphylaxis, Shortness Of Breath, Other (See Comments)   Peanut butter   Shrimp [shellfish Allergy] Anaphylaxis, Swelling   Throat swelling   Latex Itching   Cipro [ciprofloxacin Hcl] Other (See Comments)   Gagging  Muscle/body aches   Diovan Hct [valsartan-hydrochlorothiazide] Nausea Only   Methotrexate Derivatives Nausea Only   Aleve [naproxen] Nausea Only, Other (See Comments)   Headache   Other Itching, Other (See Comments)   All pain meds make her itch - has to have something to prevent that in addition to receiving med   Prednisone Palpitations, Other (See Comments)   Tachycardia OK with lower dosages   Zestril [lisinopril] Cough           Medication List     STOP taking these medications    metoprolol tartrate 25 MG tablet Commonly known as: LOPRESSOR   valsartan-hydrochlorothiazide 320-12.5 MG tablet Commonly known as: DIOVAN-HCT       TAKE these medications    albuterol 108 (90 Base) MCG/ACT inhaler Commonly known as: VENTOLIN HFA Inhale 2 puffs into the lungs every 6 (six) hours as needed for wheezing or shortness of breath.   amLODipine 5 MG tablet Commonly known as: NORVASC Take 1 tablet (5 mg total) by mouth daily.   aspirin EC 81 MG  tablet Take 1 tablet (81 mg total) by mouth daily. Swallow whole. Start taking on: January 24, 2024 What changed: additional instructions   clopidogrel 75 MG tablet Commonly known as: PLAVIX Take 1 tablet (75 mg total) by mouth daily.   losartan 100 MG tablet Commonly known as: COZAAR Take 1 tablet (100 mg total) by mouth daily.   omeprazole 20 MG capsule Commonly known as: PRILOSEC TAKE 1 CAPSULE BY MOUTH EVERY DAY BEFORE BREAKFAST What changed: See the new instructions.   rosuvastatin 40 MG tablet Commonly known as: CRESTOR Take 1 tablet (40 mg total) by mouth daily. What changed:  medication strength how much to take   Vibegron 75 MG Tabs Take 1 tablet (75 mg total) by mouth daily. What changed:  when to take this additional instructions   Vitamin D (Ergocalciferol) 1.25 MG (50000 UNIT) Caps capsule Commonly known as: DRISDOL Take 1 capsule (50,000 Units total) by mouth every 7 (seven) days. What changed: when to take this        Disposition and follow-up:   Ms.Dawsyn A Robel was discharged from [facility] in Stable condition.  At the hospital follow up visit please address:  1.  Follow-up: a. CVA- please do med rec with addition of plavix and aspirin. DAPT until 04/22/24 then plavix for life.   B. Bradycardia- metoprolol held at discharge. Neurology recommending zio patch or heart monitor at discharge.  C. HTN- will likely need titration. Hydrochlorothiazide makes incontinence worse.  2.  Labs / imaging needed at time of  follow-up: none  3.  Pending labs/ test needing follow-up: ECHO, HEART MONITOR  4.  Medication Changes Plavix 75 mg- indefinite Aspirin 81 mg- stop 04/22/24 Crestor 40 mg  Follow-up Appointments:  Follow-up Information     Glendale Chard, DO. Schedule an appointment as soon as possible for a visit in 1 month(s).   Specialty: Neurology Contact information: [address] [place]  [address] [phone]               [facility]  Hospital Course by problem list: Acute ischemic stroke of left internal capsule Hyperlipidemia Patient presented with right facial droop and lower extremity weakness.  Symptoms were noted by her daughter at church around 1030 Saturday morning.  Code stroke activated in the emergency room.  Initial CT head was negative.  MRI showed small stroke of left internal capsule.  She was admitted for stroke workup.  MRA and carotid Dopplers were negative.  Repeat lipid panel with LDL at 114.  A1c less than 5.  Echo was normal.  She was discharged with aspirin and Plavix until 04/22/2024 and then Plavix indefinitely.  Crestor was increased from 20 mg to 40 mg.  Bradycardia History of SVT Patient was bradycardic in the emergency room.  She had taken metoprolol tartrate 25 mg that morning.  This medication was recently stopped at [facility] visit in January as she was bradycardic in office.  Beta-blocker was restarted at lower dose by cardiology in February.  She has continued to have bradycardia, I will stop metoprolol at discharge. Neurology recommending zio patch at discharge.  Hypertension She was switched to a combination pill by cardiology but she reports not feeling well while taking this medication and it worsening her urinary incontinence with hydrochlorothiazide.  Initially she was concerned about amlodipine causing constipation but she reports she was already constipated prior to starting medication. Will plan to continue losartan 100 mg and amlodipine 5 mg at discharge.    GERD PPI continued at discharge.  Urinary incontinence Vibegron continued at discharge.   Discharge Subjective: Patient evaluated at bedside this AM.  She feels alright today. Has some chronic intermittent swelling pain of the foot. No acute complaints.   Discharge Exam:   BP (!) 174/76   Pulse 66   Temp 98 F (36.7 C) (Oral)   Resp 18   Ht 6' (1.829 m)   Wt 124.9 kg    LMP 01/13/2014 Comment: spotting in Feb and bleeding since April  SpO2 99%   BMI 37.35 kg/m  Constitutional: well-appearing, in no acute distress Cardiovascular: regular rate and rhythm, no m/r/g Pulmonary/Chest: normal work of breathing on room air, lungs clear to auscultation bilaterally Neurological:  Mental Status: Patient is awake, alert, oriented x3, No signs of aphasia or neglect Extremities: bilateral upper extremities 5/5, right lower extremity 4/5, left lower extremity 5/5 Sensation intact  Pertinent Labs, Studies, and Procedures:     Latest Ref Rng & Units 01/21/2024   12:37 PM 01/21/2024   12:36 PM 12/07/2023   11:33 PM  CBC  WBC 4.0 - 10.5 K/uL  5.5  8.4   Hemoglobin 12.0 - 15.0 g/dL 69.6  29.5  28.4   Hematocrit 36.0 - 46.0 % 37.0  37.6  43.6   Platelets 150 - 400 K/uL  197  253        Latest Ref Rng & Units 01/21/2024   12:37 PM 01/21/2024   12:36 PM 12/07/2023   11:33 PM  CMP  Glucose  70 - 99 mg/dL 782  956  84   BUN 8 - 23 mg/dL 15  14  13    Creatinine 0.44 - 1.00 mg/dL 2.13  0.86  5.78   Sodium 135 - 145 mmol/L 139  139  137   Potassium 3.5 - 5.1 mmol/L 3.8  3.8  3.8   Chloride 98 - 111 mmol/L 106  106  104   CO2 22 - 32 mmol/L  26  24   Calcium 8.9 - 10.3 mg/dL  9.1  9.3   Total Protein 6.5 - 8.1 g/dL  7.0    Total Bilirubin 0.0 - 1.2 mg/dL  0.3    Alkaline Phos 38 - 126 U/L  33    AST 15 - 41 U/L  16    ALT 0 - 44 U/L  10      VAS US CAROTID Result Date: 01/21/2024 Carotid Arterial Duplex Study Patient Name:  AMMANDA DOBBINS  Date of Exam:   01/21/2024 Medical Rec #: [id number]       Accession #:    [id number] Date of Birth: 03/12/1956       Patient Gender: F Patient Age:   9 years Exam Location:  [facility] Procedure:      VAS US CAROTID Referring Phys: Elmer Picker --------------------------------------------------------------------------------  Indications:       CVA and Cardiovascular symptoms. Risk Factors:      Hypertension, no history of smoking,  coronary artery                    disease. Other Factors:     S/P CABG. Comparison Study:  Previous study on 2.14.2015. Performing Technologist: Fernande Bras  Examination Guidelines: A complete evaluation includes B-mode imaging, spectral Doppler, color Doppler, and power Doppler as needed of all accessible portions of each vessel. Bilateral testing is considered an integral part of a complete examination. Limited examinations for reoccurring indications may be performed as noted.  Right Carotid Findings: +----------+--------+--------+--------+------------------+--------+           PSV cm/sEDV cm/sStenosisPlaque DescriptionComments +----------+--------+--------+--------+------------------+--------+ CCA Prox  73      12                                         +----------+--------+--------+--------+------------------+--------+ CCA Distal77      17                                         +----------+--------+--------+--------+------------------+--------+ ICA Prox  77      16              calcific                   +----------+--------+--------+--------+------------------+--------+ ICA Mid   71      19                                         +----------+--------+--------+--------+------------------+--------+ ICA Distal67      22                                         +----------+--------+--------+--------+------------------+--------+  ECA       280     36                                         +----------+--------+--------+--------+------------------+--------+ +----------+--------+-------+--------+-------------------+           PSV cm/sEDV cmsDescribeArm Pressure (mmHG) +----------+--------+-------+--------+-------------------+ Subclavian129                                        +----------+--------+-------+--------+-------------------+ +---------+--------+--+--------+--+ VertebralPSV cm/s50EDV cm/s13 +---------+--------+--+--------+--+  Slightly elevated velocities without obvious signs of stenosis. Left Carotid Findings: +----------+--------+--------+--------+------------------+--------+           PSV cm/sEDV cm/sStenosisPlaque DescriptionComments +----------+--------+--------+--------+------------------+--------+ CCA Prox  97      19                                         +----------+--------+--------+--------+------------------+--------+ CCA Distal68      25                                         +----------+--------+--------+--------+------------------+--------+ ICA Prox  66      25              heterogenous               +----------+--------+--------+--------+------------------+--------+ ICA Mid   70      19                                         +----------+--------+--------+--------+------------------+--------+ ICA Distal70      13                                         +----------+--------+--------+--------+------------------+--------+ ECA       75      9                                          +----------+--------+--------+--------+------------------+--------+ +----------+--------+--------+--------+-------------------+           PSV cm/sEDV cm/sDescribeArm Pressure (mmHG) +----------+--------+--------+--------+-------------------+ [id number]                                         +----------+--------+--------+--------+-------------------+ +---------+--------+--+--------+--+ VertebralPSV cm/s50EDV cm/s17 +---------+--------+--+--------+--+   Summary: Right Carotid: Velocities in the right ICA are consistent with a 1-39% stenosis. Left Carotid: Velocities in the left ICA are consistent with a 1-39% stenosis. Vertebrals:  Bilateral vertebral arteries demonstrate antegrade flow. Subclavians: Normal flow hemodynamics were seen in bilateral subclavian              arteries. *See table(s) above for measurements and observations.     Preliminary    MR ANGIO HEAD WO  CONTRAST Result Date: 01/21/2024 CLINICAL DATA:  Right facial droop and bilateral lower extremity weakness.  Left internal capsule infarct. EXAM: MRA HEAD WITHOUT CONTRAST TECHNIQUE: Angiographic images of the Circle of Willis were acquired using MRA technique without intravenous contrast. COMPARISON:  MR head without contrast 01/21/2024 FINDINGS: Anterior circulation: Atherosclerotic irregularity is present within the cavernous internal carotid arteries bilaterally without a significant stenosis through the ICA termini. The left A1 segment is dominant. Anterior communicating artery is patent. Moderate focal stenosis is present in the distal left M1 segment. The ACA and MCA branch vessels are within normal limits. No aneurysm is present. Posterior circulation: The vertebral arteries are codominant. PICA origins are visualized and normal. The vertebrobasilar junction and basilar artery are normal. Moderate stenosis is present the proximal right P2 segment. The PCA branch vessels are otherwise normal. No aneurysm is present. Anatomic variants: None Other: None. IMPRESSION: 1. No large vessel occlusion. 2. Moderate focal stenosis in the distal left M1 segment. 3. Moderate stenosis of the proximal right P2 segment. 4. Atherosclerotic irregularity within the cavernous internal carotid arteries bilaterally without a significant stenosis through the ICA termini. Electronically Signed   By: Marin Roberts M.D.   On: 01/21/2024 14:53   MR BRAIN WO CONTRAST Result Date: 01/21/2024 CLINICAL DATA:  Acute onset of right-sided facial droop and bilateral lower extremity weakness. Unsteady gait. EXAM: MRI HEAD WITHOUT CONTRAST TECHNIQUE: Multiplanar, multiecho pulse sequences of the brain and surrounding structures were obtained without intravenous contrast. COMPARISON:  CT head without contrast 01/21/2024. FINDINGS: Brain: A 13 mm acute nonhemorrhagic infarct is present within the left internal capsule. Subtle T2 and FLAIR  signal changes are present. Periventricular T2 hyperintensities are moderately advanced for age. The deep gray nuclei are otherwise within normal limits. The ventricles are of normal size. No significant extraaxial fluid collection is present. The brainstem and cerebellum are within normal limits. The internal auditory canals are within normal limits. Vascular: Flow is present in the major intracranial arteries. Skull and upper cervical spine: The craniocervical junction is normal. Upper cervical spine is within normal limits. Marrow signal is unremarkable. 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 Sinuses/Orbits: The paranasal sinuses and mastoid air cells are clear. Right lens replacement is present. Globes and orbits are otherwise within normal limits. IMPRESSION: 1. 13 mm acute nonhemorrhagic infarct of the left internal capsule. 2. Periventricular T2 hyperintensities are moderately advanced for age. This likely reflects the sequela of chronic microvascular ischemia. The above was relayed via text pager to [facility], NP on 01/21/2024 at 14:50 . Electronically Signed   By: Marin Roberts M.D.   On: 01/21/2024 14:50   CT HEAD CODE STROKE WO CONTRAST Result Date: 01/21/2024 CLINICAL DATA:  Code stroke. EXAM: CT HEAD WITHOUT CONTRAST TECHNIQUE: Contiguous axial images were obtained from the base of the skull through the vertex without intravenous contrast. RADIATION DOSE REDUCTION: This exam was performed according to the departmental dose-optimization program which includes automated exposure control, adjustment of the mA and/or kV according to patient size and/or use of iterative reconstruction technique. COMPARISON:  09/29/2014 FINDINGS: Brain: No evidence of acute infarction, hemorrhage, hydrocephalus, extra-axial collection or mass lesion/mass effect. Mild low-density in the cerebral white matter attributed to chronic small vessel disease. Dural calcification especially at the  vertex. Vascular: No hyperdense vessel or unexpected calcification. Skull: Normal. Negative for fracture or focal lesion. Sinuses/Orbits: No acute finding. Other: Prelim sent in epic chat. IMPRESSION: 1. No acute finding. 2. Mild chronic small vessel disease. Electronically Signed   By: Tiburcio Pea M.D.   On: 01/21/2024  12:44     Discharge Instructions: Discharge Instructions     Ambulatory referral to Neurology   Complete by: As directed    Follow up with Dr. Allena Katz at [facility] in 4-6 weeks.  Patient is Dr. Eliane Decree patient. Thanks.   Diet - low sodium heart healthy   Complete by: As directed    Discharge instructions   Complete by: As directed    Ms. Rhines, You were admitted because you had a very small stroke.  Thankfully your symptoms have resolved.  I am hopeful that we can make some changes to help decrease your risk of having future strokes.  The most important thing for you is to make sure your blood pressure and cholesterol are very well-controlled.  For your blood pressure I am going to continue the losartan and amlodipine..  Please call the internal medicine clinic to have a follow-up appointment in 1 to 2 weeks.  For your cholesterol please take Crestor 40 mg.  Please take aspirin and plavix until 04/22/24, then continue plavix long term. You are being mailed a heart monitor to wear for 2 weeks so that we can make sure the your heart did not lead to your stroke.  I am glad that you are feeling better and thank you for letting me take part in your care, Dr. Chalmers Guest activity slowly   Complete by: As directed        Signed: Rudene Christians, DO 01/23/2024, 11:16 AM   Pager: [phone]

## 2024-01-22 NOTE — Plan of Care (Signed)
   Problem: Education: Goal: Knowledge of disease or condition will improve Outcome: Progressing Goal: Knowledge of secondary prevention will improve (MUST DOCUMENT ALL) Outcome: Progressing

## 2024-01-22 NOTE — Hospital Course (Addendum)
 Acute ischemic stroke of left internal capsule Hyperlipidemia Patient presented with right facial droop and lower extremity weakness.  Symptoms were noted by her daughter at church around 1030 Saturday morning.  Code stroke activated in the emergency room.  Initial CT head was negative.  MRI showed small stroke of left internal capsule.  She was admitted for stroke workup.  MRA and carotid Dopplers were negative.  Repeat lipid panel with LDL at 114.  A1c less than 5.  Echo was normal.  She was discharged with aspirin and Plavix until 04/22/2024 and then Plavix indefinitely.  Crestor was increased from 20 mg to 40 mg.  Bradycardia History of SVT Patient was bradycardic in the emergency room.  She had taken metoprolol tartrate 25 mg that morning.  This medication was recently stopped at [facility] visit in January as she was bradycardic in office.  Beta-blocker was restarted at lower dose by cardiology in February.  She has continued to have bradycardia, I will stop metoprolol at discharge. Neurology recommending zio patch at discharge.  Hypertension She was switched to a combination pill by cardiology but she reports not feeling well while taking this medication and it worsening her urinary incontinence with hydrochlorothiazide.  Initially she was concerned about amlodipine causing constipation but she reports she was already constipated prior to starting medication. Will plan to continue losartan 100 mg and amlodipine 5 mg at discharge.    GERD PPI continued at discharge.  Urinary incontinence Vibegron continued at discharge.

## 2024-01-22 NOTE — Evaluation (Signed)
 Physical Therapy Evaluation and Discharge Patient Details Name: [name] MRN: [id number] DOB: Oct 05, 1956 Today's Date: 01/22/2024  History of Present Illness  Pt is a 68 y.o. female presenting with R facial droop. MRI with 13mm acute nonhemorrhagic infarct in L internal capsule. PMH significant for CAD s/p CABG, HTN, GERD, uterine cancer.  Clinical Impression   Patient evaluated by Physical Therapy with no further acute PT needs identified. Patient at her baseline for mobility. Walked without device x150 ft with balance challenges and no imbalance or unsteadiness noted. PT is signing off. Thank you for this referral.         If plan is discharge home, recommend the following:     Can travel by private vehicle        Equipment Recommendations None recommended by PT  Recommendations for Other Services       Functional Status Assessment Patient has not had a recent decline in their functional status     Precautions / Restrictions Precautions Precautions: None      Mobility  Bed Mobility Overal bed mobility: Independent                  Transfers Overall transfer level: Independent Equipment used: None                    Ambulation/Gait Ambulation/Gait assistance: Independent Gait Distance (Feet): 150 Feet Assistive device: None Gait Pattern/deviations: Step-through pattern, Antalgic   Gait velocity interpretation: 1.31 - 2.62 ft/sec, indicative of limited community ambulator   General Gait Details: slower velocity; unable to incr velocity but able to decrease and maintain balance; head turns, sudden stops without difficulty  Stairs            Wheelchair Mobility     Tilt Bed    Modified Rankin (Stroke Patients Only) Modified Rankin (Stroke Patients Only) Pre-Morbid Rankin Score: No significant disability Modified Rankin: No significant disability     Balance Overall balance assessment: Independent                                            Pertinent Vitals/Pain Pain Assessment Pain Assessment: Faces Faces Pain Scale: Hurts a little bit Pain Location: rt knee Pain Descriptors / Indicators: Aching Pain Intervention(s): Limited activity within patient's tolerance, Monitored during session    Home Living Family/patient expects to be discharged to:: Private residence Living Arrangements: Alone   Type of Home: House Home Access: Level entry       Home Layout: One level Home Equipment: Agricultural consultant (2 wheels);Cane - single point;BSC/3in1;Tub bench;Grab bars - tub/shower      Prior Function Prior Level of Function : Independent/Modified Independent;Driving             Mobility Comments: typically uses cane first thing in the morning due to arthritic pain RLE; once she's been up walking for a while she uses no device to ambulate ADLs Comments: modifed independent with use of DME; uses electric cart at grocery store     Extremity/Trunk Assessment   Upper Extremity Assessment Upper Extremity Assessment: Overall WFL for tasks assessed    Lower Extremity Assessment Lower Extremity Assessment: Overall WFL for tasks assessed    Cervical / Trunk Assessment Cervical / Trunk Assessment: Other exceptions Cervical / Trunk Exceptions: overweight  Communication   Communication Communication: No apparent difficulties    Cognition Arousal: Alert  Behavior During Therapy: WFL for tasks assessed/performed   PT - Cognitive impairments: No apparent impairments                         Following commands: Intact       Cueing       General Comments      Exercises     Assessment/Plan    PT Assessment Patient does not need any further PT services  PT Problem List         PT Treatment Interventions      PT Goals (Current goals can be found in the Care Plan section)  Acute Rehab PT Goals PT Goal Formulation: All assessment and education complete, DC therapy     Frequency       Co-evaluation               AM-PAC PT "6 Clicks" Mobility  Outcome Measure Help needed turning from your back to your side while in a flat bed without using bedrails?: None Help needed moving from lying on your back to sitting on the side of a flat bed without using bedrails?: None Help needed moving to and from a bed to a chair (including a wheelchair)?: None Help needed standing up from a chair using your arms (e.g., wheelchair or bedside chair)?: None Help needed to walk in hospital room?: None Help needed climbing 3-5 steps with a railing? : None 6 Click Score: 24    End of Session Equipment Utilized During Treatment: Gait belt Activity Tolerance: Patient tolerated treatment well Patient left: in bed;with call bell/phone within reach;with bed alarm set Nurse Communication: Mobility status;Other (comment) (no PT or DME needs) PT Visit Diagnosis: Other abnormalities of gait and mobility (R26.89)    Time: 1004-1020 PT Time Calculation (min) (ACUTE ONLY): 16 min   Charges:   PT Evaluation $PT Eval Low Complexity: 1 Low   PT General Charges $$ ACUTE PT VISIT: 1 Visit          Jerolyn Center, PT Acute Rehabilitation Services  Office [phone]   Zena Amos 01/22/2024, 10:33 AM

## 2024-01-22 NOTE — Progress Notes (Signed)
 OT Cancellation Note  Patient Details Name: [name] MRN: [id number] DOB: September 27, 1956   Cancelled Treatment:    Reason Eval/Treat Not Completed: OT screened, no needs identified, will sign off. Per PT, pt is at her baseline and does not need assist with ADL at this time. OT to sign off. Please re-consult if change in status.   Tyler Deis, OTR/L [facility] Acute Rehabilitation Office: [phone]   [name] 01/22/2024, 10:25 AM

## 2024-01-22 NOTE — Progress Notes (Signed)
 HD#0 Subjective:  Patient Summary: [name] is a 68 y.o. with a pertinent PMH of CAD s/p CABG, HTN, HLD, who presented with right sided facial droop and admitted on 01/22/24 for acute left internal capsule CVA on HD#0.   Overnight Events: none  Patient evaluated at bedside this AM.  Patient seen at bedside this morning, reports that her symptoms have resolved.  She is awaiting therapy evaluation and echo and will be ready to discharge following that.  We reviewed diagnosis and indications for cholesterol and blood pressure control to help prevent future strokes.   Pt is updated on the plan for today, and all questions and concerns are addressed.   Objective:  Vital signs in last 24 hours: Vitals:   01/21/24 2333 01/22/24 0343 01/22/24 0415 01/22/24 0808  BP: (!) 162/75  (!) 156/75 (!) 152/66  Pulse: 70  62 73  Resp: 18  18 16   Temp: 98.7 F (37.1 C)  98.2 F (36.8 C) 98.2 F (36.8 C)  TempSrc: Oral  Oral Oral  SpO2: 100%  98% 99%  Weight:  124.9 kg    Height:       Supplemental O2: Room Air SpO2: 99 %   Physical Exam:  Constitutional: well-appearing, in no acute distress Cardiovascular: regular rate and rhythm, no m/r/g Pulmonary/Chest: normal work of breathing on room air, lungs clear to auscultation bilaterally Abdominal: soft, non-tender, non-distended Neurological:  Mental Status: Patient is awake, alert, oriented x3, No signs of aphasia or neglect Cranial Nerves: II: Pupils equal, round, and reactive to light.   III,IV, VI: EOMI without ptosis or diploplia.  V: Facial sensation is symmetric to light touch and temperature. VII: Facial movement is symmetric.  VIII: Hearing is intact to voice X: Uvula elevates symmetrically XI: Shoulder shrug is symmetric. XII: Tongue is midline without atrophy or fasciculations.  Motor: Good effort thorughout, at least 5/5 bilateral UE, 5/5 bilateral LE Sensory: Sensation is grossly intact bilateral UE & LE Cerebellar:  Finger-Nose and Heel-Shin intact bilaterally  Filed Weights   01/21/24 1235 01/21/24 2300 01/22/24 0343  Weight: 128.6 kg 124.9 kg 124.9 kg     Intake/Output Summary (Last 24 hours) at 01/22/2024 1130 Last data filed at 01/21/2024 2256 Gross per 24 hour  Intake 360 ml  Output --  Net 360 ml   Net IO Since Admission: 360 mL [01/22/24 1130]  Pertinent Labs:    Latest Ref Rng & Units 01/21/2024   12:37 PM 01/21/2024   12:36 PM 12/07/2023   11:33 PM  CBC  WBC 4.0 - 10.5 K/uL  5.5  8.4   Hemoglobin 12.0 - 15.0 g/dL 16.1  09.6  04.5   Hematocrit 36.0 - 46.0 % 37.0  37.6  43.6   Platelets 150 - 400 K/uL  197  253        Latest Ref Rng & Units 01/21/2024   12:37 PM 01/21/2024   12:36 PM 12/07/2023   11:33 PM  CMP  Glucose 70 - 99 mg/dL 409  811  84   BUN 8 - 23 mg/dL 15  14  13    Creatinine 0.44 - 1.00 mg/dL 9.14  7.82  9.56   Sodium 135 - 145 mmol/L 139  139  137   Potassium 3.5 - 5.1 mmol/L 3.8  3.8  3.8   Chloride 98 - 111 mmol/L 106  106  104   CO2 22 - 32 mmol/L  26  24   Calcium 8.9 - 10.3  mg/dL  9.1  9.3   Total Protein 6.5 - 8.1 g/dL  7.0    Total Bilirubin 0.0 - 1.2 mg/dL  0.3    Alkaline Phos 38 - 126 U/L  33    AST 15 - 41 U/L  16    ALT 0 - 44 U/L  10      Imaging: VAS US CAROTID Result Date: 01/22/2024 Carotid Arterial Duplex Study Patient Name:  DEMI TRIEU  Date of Exam:   01/21/2024 Medical Rec #: [id number]       Accession #:    [id number] Date of Birth: Oct 28, 1956       Patient Gender: F Patient Age:   4 years Exam Location:  [facility] Procedure:      VAS US CAROTID Referring Phys: Elmer Picker --------------------------------------------------------------------------------  Indications:       CVA and Cardiovascular symptoms. Risk Factors:      Hypertension, no history of smoking, coronary artery                    disease. Other Factors:     S/P CABG. Comparison Study:  Previous study on 2.14.2015. Performing Technologist: Fernande Bras  Examination  Guidelines: A complete evaluation includes B-mode imaging, spectral Doppler, color Doppler, and power Doppler as needed of all accessible portions of each vessel. Bilateral testing is considered an integral part of a complete examination. Limited examinations for reoccurring indications may be performed as noted.  Right Carotid Findings: +----------+--------+--------+--------+------------------+--------+           PSV cm/sEDV cm/sStenosisPlaque DescriptionComments +----------+--------+--------+--------+------------------+--------+ CCA Prox  73      12                                         +----------+--------+--------+--------+------------------+--------+ CCA Distal77      17                                         +----------+--------+--------+--------+------------------+--------+ ICA Prox  77      16              calcific                   +----------+--------+--------+--------+------------------+--------+ ICA Mid   71      19                                         +----------+--------+--------+--------+------------------+--------+ ICA Distal67      22                                         +----------+--------+--------+--------+------------------+--------+ ECA       280     36                                         +----------+--------+--------+--------+------------------+--------+ +----------+--------+-------+--------+-------------------+           PSV cm/sEDV cmsDescribeArm Pressure (mmHG) +----------+--------+-------+--------+-------------------+ Subclavian129                                        +----------+--------+-------+--------+-------------------+ +---------+--------+--+--------+--+  VertebralPSV cm/s50EDV cm/s13 +---------+--------+--+--------+--+ Slightly elevated velocities without obvious signs of stenosis. Left Carotid Findings: +----------+--------+--------+--------+------------------+--------+           PSV cm/sEDV  cm/sStenosisPlaque DescriptionComments +----------+--------+--------+--------+------------------+--------+ CCA Prox  97      19                                         +----------+--------+--------+--------+------------------+--------+ CCA Distal68      25                                         +----------+--------+--------+--------+------------------+--------+ ICA Prox  66      25              heterogenous               +----------+--------+--------+--------+------------------+--------+ ICA Mid   70      19                                         +----------+--------+--------+--------+------------------+--------+ ICA Distal70      13                                         +----------+--------+--------+--------+------------------+--------+ ECA       75      9                                          +----------+--------+--------+--------+------------------+--------+ +----------+--------+--------+--------+-------------------+           PSV cm/sEDV cm/sDescribeArm Pressure (mmHG) +----------+--------+--------+--------+-------------------+ [id number]                                         +----------+--------+--------+--------+-------------------+ +---------+--------+--+--------+--+ VertebralPSV cm/s50EDV cm/s17 +---------+--------+--+--------+--+   Summary: Right Carotid: Velocities in the right ICA are consistent with a 1-39% stenosis. Left Carotid: Velocities in the left ICA are consistent with a 1-39% stenosis. Vertebrals:  Bilateral vertebral arteries demonstrate antegrade flow. Subclavians: Normal flow hemodynamics were seen in bilateral subclavian              arteries. *See table(s) above for measurements and observations.  Electronically signed by Coral Else MD on 01/22/2024 at 10:44:14 AM.    Final    MR ANGIO HEAD WO CONTRAST Result Date: 01/21/2024 CLINICAL DATA:  Right facial droop and bilateral lower extremity weakness. Left internal  capsule infarct. EXAM: MRA HEAD WITHOUT CONTRAST TECHNIQUE: Angiographic images of the Circle of Willis were acquired using MRA technique without intravenous contrast. COMPARISON:  MR head without contrast 01/21/2024 FINDINGS: Anterior circulation: Atherosclerotic irregularity is present within the cavernous internal carotid arteries bilaterally without a significant stenosis through the ICA termini. The left A1 segment is dominant. Anterior communicating artery is patent. Moderate focal stenosis is present in the distal left M1 segment. The ACA and MCA branch vessels are within normal limits. No aneurysm is present. Posterior circulation: The vertebral arteries are codominant. PICA origins are visualized and  normal. The vertebrobasilar junction and basilar artery are normal. Moderate stenosis is present the proximal right P2 segment. The PCA branch vessels are otherwise normal. No aneurysm is present. Anatomic variants: None Other: None. IMPRESSION: 1. No large vessel occlusion. 2. Moderate focal stenosis in the distal left M1 segment. 3. Moderate stenosis of the proximal right P2 segment. 4. Atherosclerotic irregularity within the cavernous internal carotid arteries bilaterally without a significant stenosis through the ICA termini. Electronically Signed   By: Marin Roberts M.D.   On: 01/21/2024 14:53   MR BRAIN WO CONTRAST Result Date: 01/21/2024 CLINICAL DATA:  Acute onset of right-sided facial droop and bilateral lower extremity weakness. Unsteady gait. EXAM: MRI HEAD WITHOUT CONTRAST TECHNIQUE: Multiplanar, multiecho pulse sequences of the brain and surrounding structures were obtained without intravenous contrast. COMPARISON:  CT head without contrast 01/21/2024. FINDINGS: Brain: A 13 mm acute nonhemorrhagic infarct is present within the left internal capsule. Subtle T2 and FLAIR signal changes are present. Periventricular T2 hyperintensities are moderately advanced for age. The deep gray nuclei are  otherwise within normal limits. The ventricles are of normal size. No significant extraaxial fluid collection is present. The brainstem and cerebellum are within normal limits. The internal auditory canals are within normal limits. Vascular: Flow is present in the major intracranial arteries. Skull and upper cervical spine: The craniocervical junction is normal. Upper cervical spine is within normal limits. Marrow signal is unremarkable. 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 Sinuses/Orbits: The paranasal sinuses and mastoid air cells are clear. Right lens replacement is present. Globes and orbits are otherwise within normal limits. IMPRESSION: 1. 13 mm acute nonhemorrhagic infarct of the left internal capsule. 2. Periventricular T2 hyperintensities are moderately advanced for age. This likely reflects the sequela of chronic microvascular ischemia. The above was relayed via text pager to [facility], NP on 01/21/2024 at 14:50 . Electronically Signed   By: Marin Roberts M.D.   On: 01/21/2024 14:50   CT HEAD CODE STROKE WO CONTRAST Result Date: 01/21/2024 CLINICAL DATA:  Code stroke. EXAM: CT HEAD WITHOUT CONTRAST TECHNIQUE: Contiguous axial images were obtained from the base of the skull through the vertex without intravenous contrast. RADIATION DOSE REDUCTION: This exam was performed according to the departmental dose-optimization program which includes automated exposure control, adjustment of the mA and/or kV according to patient size and/or use of iterative reconstruction technique. COMPARISON:  09/29/2014 FINDINGS: Brain: No evidence of acute infarction, hemorrhage, hydrocephalus, extra-axial collection or mass lesion/mass effect. Mild low-density in the cerebral white matter attributed to chronic small vessel disease. Dural calcification especially at the vertex. Vascular: No hyperdense vessel or unexpected calcification. Skull: Normal. Negative for fracture or focal lesion.  Sinuses/Orbits: No acute finding. Other: Prelim sent in epic chat. IMPRESSION: 1. No acute finding. 2. Mild chronic small vessel disease. Electronically Signed   By: Tiburcio Pea M.D.   On: 01/21/2024 12:44    Assessment/Plan:   Principal Problem:   Acute CVA (cerebrovascular accident) [facility])   Patient Summary: [name] is a 68 y.o. with a pertinent PMH of CAD s/p CABG, HTN, HLD, who presented with right sided facial droop and admitted on 01/22/24 for acute left internal capsule CVA on HD#0.   Acute ischemic stroke of left internal capsule Hyperlipidemia Patient here with ischemic stroke.  Her symptoms have resolved since onset yesterday morning.  Stroke team evaluation with mentation for DAPT until 3/23 and then to continue Plavix indefinitely.  She is still pending an  echo for complete stroke workup.  -pending echo -Restart losartan and amlodipine 3/3 to allow for 48 hours of permissive hypertension -Crestor 40 mg -Aspirin and Plavix  Bradycardia History of SVT Remains bradycardic this morning.  Will plan to hold beta-blocker at discharge.  Hypertension Following for permissive hypertension for 48 hours currently.  Will plan to restart losartan 100 mg and amlodipine 5 mg tomorrow.  GERD Continued home PPI and Maalox as needed  Diet: Normal IVF: None,None VTE: Enoxaparin Code: Full PT/OT recs: None, none.   Dispo: Anticipated discharge to home today after echo.  Coila Wardell M. Miarose Lippert, D.O.  Internal Medicine Resident, PGY-3 [facility]  Pager: [phone] 11:30 AM, 01/22/2024   **Please contact the on call pager after 5 pm and on weekends at [phone].**

## 2024-01-22 NOTE — Progress Notes (Addendum)
 STROKE TEAM PROGRESS NOTE    INTERIM HISTORY/SUBJECTIVE  Patient is lying in bed.  She states she feels back to baseline.  No focal neurological deficits seen.   Discussed plan of care and follow-up needed with patient, she is in agreement.  OBJECTIVE  CBC    Component Value Date/Time   WBC 5.5 01/21/2024 1236   RBC 4.37 01/21/2024 1236   HGB 12.6 01/21/2024 1237   HGB 13.6 01/11/2022 0928   HCT 37.0 01/21/2024 1237   HCT 42.8 01/11/2022 0928   PLT 197 01/21/2024 1236   PLT 234 01/11/2022 0928   MCV 86.0 01/21/2024 1236   MCV 88 01/11/2022 0928   MCH 28.6 01/21/2024 1236   MCHC 33.2 01/21/2024 1236   RDW 14.9 01/21/2024 1236   RDW 14.6 01/11/2022 0928   LYMPHSABS 2.0 01/21/2024 1236   MONOABS 0.5 01/21/2024 1236   EOSABS 0.1 01/21/2024 1236   BASOSABS 0.0 01/21/2024 1236    BMET    Component Value Date/Time   NA 139 01/21/2024 1237   NA 138 01/11/2022 0928   K 3.8 01/21/2024 1237   CL 106 01/21/2024 1237   CO2 26 01/21/2024 1236   GLUCOSE 107 (H) 01/21/2024 1237   BUN 15 01/21/2024 1237   BUN 9 01/11/2022 0928   CREATININE 0.90 01/21/2024 1237   CREATININE 0.76 04/26/2023 1409   CALCIUM 9.1 01/21/2024 1236   EGFR 85 04/26/2023 1409   EGFR 81 01/11/2022 0928   GFRNONAA >60 01/21/2024 1236   GFRNONAA 88 02/27/2015 1206    IMAGING past 24 hours VAS US CAROTID Result Date: 01/22/2024 Carotid Arterial Duplex Study Patient Name:  [name]  Date of Exam:   01/21/2024 Medical Rec #: [id number]       Accession #:    [id number] Date of Birth: 01-24-1956       Patient Gender: F Patient Age:   2 years Exam Location:  [facility] Procedure:      VAS US CAROTID Referring Phys: Elmer Picker --------------------------------------------------------------------------------  Indications:       CVA and Cardiovascular symptoms. Risk Factors:      Hypertension, no history of smoking, coronary artery                    disease. Other Factors:     S/P CABG. Comparison Study:   Previous study on 2.14.2015. Performing Technologist: Fernande Bras  Examination Guidelines: A complete evaluation includes B-mode imaging, spectral Doppler, color Doppler, and power Doppler as needed of all accessible portions of each vessel. Bilateral testing is considered an integral part of a complete examination. Limited examinations for reoccurring indications may be performed as noted.  Right Carotid Findings: +----------+--------+--------+--------+------------------+--------+           PSV cm/sEDV cm/sStenosisPlaque DescriptionComments +----------+--------+--------+--------+------------------+--------+ CCA Prox  73      12                                         +----------+--------+--------+--------+------------------+--------+ CCA Distal77      17                                         +----------+--------+--------+--------+------------------+--------+ ICA Prox  77      16  calcific                   +----------+--------+--------+--------+------------------+--------+ ICA Mid   71      19                                         +----------+--------+--------+--------+------------------+--------+ ICA Distal67      22                                         +----------+--------+--------+--------+------------------+--------+ ECA       280     36                                         +----------+--------+--------+--------+------------------+--------+ +----------+--------+-------+--------+-------------------+           PSV cm/sEDV cmsDescribeArm Pressure (mmHG) +----------+--------+-------+--------+-------------------+ Subclavian129                                        +----------+--------+-------+--------+-------------------+ +---------+--------+--+--------+--+ VertebralPSV cm/s50EDV cm/s13 +---------+--------+--+--------+--+ Slightly elevated velocities without obvious signs of stenosis. Left Carotid Findings:  +----------+--------+--------+--------+------------------+--------+           PSV cm/sEDV cm/sStenosisPlaque DescriptionComments +----------+--------+--------+--------+------------------+--------+ CCA Prox  97      19                                         +----------+--------+--------+--------+------------------+--------+ CCA Distal68      25                                         +----------+--------+--------+--------+------------------+--------+ ICA Prox  66      25              heterogenous               +----------+--------+--------+--------+------------------+--------+ ICA Mid   70      19                                         +----------+--------+--------+--------+------------------+--------+ ICA Distal70      13                                         +----------+--------+--------+--------+------------------+--------+ ECA       75      9                                          +----------+--------+--------+--------+------------------+--------+ +----------+--------+--------+--------+-------------------+           PSV cm/sEDV cm/sDescribeArm Pressure (mmHG) +----------+--------+--------+--------+-------------------+ [id number]                                         +----------+--------+--------+--------+-------------------+ +---------+--------+--+--------+--+  VertebralPSV cm/s50EDV cm/s17 +---------+--------+--+--------+--+   Summary: Right Carotid: Velocities in the right ICA are consistent with a 1-39% stenosis. Left Carotid: Velocities in the left ICA are consistent with a 1-39% stenosis. Vertebrals:  Bilateral vertebral arteries demonstrate antegrade flow. Subclavians: Normal flow hemodynamics were seen in bilateral subclavian              arteries. *See table(s) above for measurements and observations.  Electronically signed by Coral Else MD on 01/22/2024 at 10:44:14 AM.    Final    MR ANGIO HEAD WO CONTRAST Result Date:  01/21/2024 CLINICAL DATA:  Right facial droop and bilateral lower extremity weakness. Left internal capsule infarct. EXAM: MRA HEAD WITHOUT CONTRAST TECHNIQUE: Angiographic images of the Circle of Willis were acquired using MRA technique without intravenous contrast. COMPARISON:  MR head without contrast 01/21/2024 FINDINGS: Anterior circulation: Atherosclerotic irregularity is present within the cavernous internal carotid arteries bilaterally without a significant stenosis through the ICA termini. The left A1 segment is dominant. Anterior communicating artery is patent. Moderate focal stenosis is present in the distal left M1 segment. The ACA and MCA branch vessels are within normal limits. No aneurysm is present. Posterior circulation: The vertebral arteries are codominant. PICA origins are visualized and normal. The vertebrobasilar junction and basilar artery are normal. Moderate stenosis is present the proximal right P2 segment. The PCA branch vessels are otherwise normal. No aneurysm is present. Anatomic variants: None Other: None. IMPRESSION: 1. No large vessel occlusion. 2. Moderate focal stenosis in the distal left M1 segment. 3. Moderate stenosis of the proximal right P2 segment. 4. Atherosclerotic irregularity within the cavernous internal carotid arteries bilaterally without a significant stenosis through the ICA termini. Electronically Signed   By: Marin Roberts M.D.   On: 01/21/2024 14:53   MR BRAIN WO CONTRAST Result Date: 01/21/2024 CLINICAL DATA:  Acute onset of right-sided facial droop and bilateral lower extremity weakness. Unsteady gait. EXAM: MRI HEAD WITHOUT CONTRAST TECHNIQUE: Multiplanar, multiecho pulse sequences of the brain and surrounding structures were obtained without intravenous contrast. COMPARISON:  CT head without contrast 01/21/2024. FINDINGS: Brain: A 13 mm acute nonhemorrhagic infarct is present within the left internal capsule. Subtle T2 and FLAIR signal changes are  present. Periventricular T2 hyperintensities are moderately advanced for age. The deep gray nuclei are otherwise within normal limits. The ventricles are of normal size. No significant extraaxial fluid collection is present. The brainstem and cerebellum are within normal limits. The internal auditory canals are within normal limits. Vascular: Flow is present in the major intracranial arteries. Skull and upper cervical spine: The craniocervical junction is normal. Upper cervical spine is within normal limits. Marrow signal is unremarkable. 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 Sinuses/Orbits: The paranasal sinuses and mastoid air cells are clear. Right lens replacement is present. Globes and orbits are otherwise within normal limits. IMPRESSION: 1. 13 mm acute nonhemorrhagic infarct of the left internal capsule. 2. Periventricular T2 hyperintensities are moderately advanced for age. This likely reflects the sequela of chronic microvascular ischemia. The above was relayed via text pager to [facility], NP on 01/21/2024 at 14:50 . Electronically Signed   By: Marin Roberts M.D.   On: 01/21/2024 14:50   CT HEAD CODE STROKE WO CONTRAST Result Date: 01/21/2024 CLINICAL DATA:  Code stroke. EXAM: CT HEAD WITHOUT CONTRAST TECHNIQUE: Contiguous axial images were obtained from the base of the skull through the vertex without intravenous contrast. RADIATION DOSE REDUCTION: This exam was performed according to the departmental  dose-optimization program which includes automated exposure control, adjustment of the mA and/or kV according to patient size and/or use of iterative reconstruction technique. COMPARISON:  09/29/2014 FINDINGS: Brain: No evidence of acute infarction, hemorrhage, hydrocephalus, extra-axial collection or mass lesion/mass effect. Mild low-density in the cerebral white matter attributed to chronic small vessel disease. Dural calcification especially at the vertex. Vascular: No  hyperdense vessel or unexpected calcification. Skull: Normal. Negative for fracture or focal lesion. Sinuses/Orbits: No acute finding. Other: Prelim sent in epic chat. IMPRESSION: 1. No acute finding. 2. Mild chronic small vessel disease. Electronically Signed   By: Tiburcio Pea M.D.   On: 01/21/2024 12:44    Vitals:   01/21/24 2333 01/22/24 0343 01/22/24 0415 01/22/24 0808  BP: (!) 162/75  (!) 156/75 (!) 152/66  Pulse: 70  62 73  Resp: 18  18 16   Temp: 98.7 F (37.1 C)  98.2 F (36.8 C) 98.2 F (36.8 C)  TempSrc: Oral  Oral Oral  SpO2: 100%  98% 99%  Weight:  124.9 kg    Height:         PHYSICAL EXAM General:  Alert, well-nourished, well-developed patient in no acute distress Psych:  Mood and affect appropriate for situation CV: Regular rate and rhythm on monitor Respiratory:  Regular, unlabored respirations on room air GI: Abdomen soft and nontender   NEURO:  Mental Status: Awake alert and oriented.  Interactive, able to give clear history. Speech/Language: No dysarthria or aphasia.  Naming and repetition intact.  speech is without dysarthria or aphasia.  Naming, repetition, fluency, and comprehension intact.  Cranial Nerves:  II: PERRL. Visual fields full.  III, IV, VI: EOMI. Eyelids elevate symmetrically.  V: Sensation is intact to light touch and symmetrical to face.  VII: Face is symmetrical resting and smiling VIII: hearing intact to voice. IX, X: Palate elevates symmetrically. Phonation is normal.  ZO:[id number] shrug 5/5. XII: Midline tongue protrusion Motor: 5/5 strength to all muscle groups tested.  Tone: is normal and bulk is normal Sensation- Intact to light touch bilaterally. Extinction absent to light touch to DSS.   Coordination: FTN intact bilaterally, HKS: no ataxia in BLE.No drift.  Gait- deferred  Most Recent NIH: 0    ASSESSMENT/PLAN  [name] is a 68 y.o. female with history of CAD s/p CABG (2015), HTN, HLD, Asthma, GERD,  admitted  for stroke workup after presenting with right facial droop.  NIH on Admission:3.  Stroke:  left BG lacunar infarct, etiology: Likely small vessel disease  Code Stroke CT head No acute abnormality. Small vessel disease.    MRI  13 mm acute nonhemorrhagic infarct left internal capsule MRA  No LVO, Moderate focal stenosis distal left M1, Moderate stenosis proximal right P2 Carotid Doppler: Negative 2D Echo: PENDING LDL 114 HgbA1c 4.9 UDS neg VTE prophylaxis - lovenox aspirin 81 mg daily prior to admission, now on aspirin 81 mg daily and clopidogrel 75 mg daily for 3 months and then Plavix alone. Therapy recommendations:  No follow up needed  Disposition:  likely home 3/2  Heart palpitation Patient complained intermittent heart palpitation It happened in the past with getting up from bed or eating meals Recommend outpatient Ziopatch or 30-day cardiac event monitoring to rule out A-fib Patient follow with Dr. Rennis Golden at cardiology  Hypertension Home meds: Losartan 100 mg, metoprolol 25 mg, Diovan Stable Long term BP goal normotensive Avoid hypotension  Hyperlipidemia Home meds: Crestor 20 mg LDL 114, goal < 70 Increase to Crestor 40 mg  Continue statin at discharge  Other Stroke Risk Factors Obesity, Body mass index is 37.35 kg/m., BMI >/= 30 associated with increased stroke risk, recommend weight loss, diet and exercise as appropriate  Coronary artery disease, s/p CABG x5 2015  Other Active Problems Neuropathy  Hospital day # 0   Pt seen by Neuro NP/APP and later by MD. Note/plan to be edited by MD as needed.    Lynnae January, DNP, AGACNP-BC [facility] Please use AMION for contact information & EPIC for messaging.  ATTENDING NOTE: I reviewed above note and agree with the assessment and plan. Pt was seen and examined.   No family at bedside.  Patient sitting at edge of bed, stated that her symptoms all resolved.  However, on exam she still has slight  nasolabial fold flattening on the right.  Otherwise neuro intact.  She complained of intermittent heart palpitation, sometimes at getting up from bed, sometimes with eating meals.  She did have a history of CAD status post CABG, follows with Dr. Rennis Golden at cardiology, will recommend Zio patch or 30-day cardiac event monitoring as outpatient to rule out A-fib.  Now on DAPT for 3 months and then Plavix alone given focal stenosis of left M1.  Increase Crestor to 40.  Aggressive stroke risk factor modification.  Follow-up at [facility] in 4 to 6 weeks.   For detailed assessment and plan, please refer to above/below as I have made changes wherever appropriate.   Neurology will sign off. Please call with questions. Pt will follow up with Dr. Allena Katz at [facility] in about 4 weeks. Thanks for the consult.   Marvel Plan, MD PhD Stroke Neurology 01/22/2024 12:52 PM     To contact Stroke Continuity provider, please refer to [email or web address]. After hours, contact General Neurology

## 2024-01-22 NOTE — Care Management Obs Status (Signed)
 MEDICARE OBSERVATION STATUS NOTIFICATION   Patient Details  Name: BRINDA FOCHT MRN: [id number] Date of Birth: 02-11-1956   Medicare Observation Status Notification Given:  Yes    Westlee Devita G., RN 01/22/2024, 8:28 AM

## 2024-01-23 ENCOUNTER — Telehealth: Payer: Self-pay | Admitting: Internal Medicine

## 2024-01-23 ENCOUNTER — Observation Stay (INDEPENDENT_AMBULATORY_CARE_PROVIDER_SITE_OTHER)

## 2024-01-23 ENCOUNTER — Observation Stay (HOSPITAL_BASED_OUTPATIENT_CLINIC_OR_DEPARTMENT_OTHER)

## 2024-01-23 DIAGNOSIS — R001 Bradycardia, unspecified: Secondary | ICD-10-CM

## 2024-01-23 DIAGNOSIS — I6389 Other cerebral infarction: Secondary | ICD-10-CM

## 2024-01-23 DIAGNOSIS — I639 Cerebral infarction, unspecified: Secondary | ICD-10-CM | POA: Diagnosis not present

## 2024-01-23 LAB — ECHOCARDIOGRAM COMPLETE
AR max vel: 1.88 cm2
AV Area VTI: 1.74 cm2
AV Area mean vel: 1.8 cm2
AV Mean grad: 6 mmHg
AV Peak grad: 10.4 mmHg
Ao pk vel: 1.61 m/s
Area-P 1/2: 3.91 cm2
Height: 72 in
MV VTI: 1.76 cm2
S' Lateral: 4.1 cm
Weight: 4406.4 [oz_av]

## 2024-01-23 MED ORDER — LOSARTAN POTASSIUM 100 MG PO TABS: 100.0000 mg | ORAL_TABLET | Freq: Every day | ORAL | 3 refills | Status: DC

## 2024-01-23 MED ORDER — ASPIRIN 81 MG PO TBEC: 81.0000 mg | DELAYED_RELEASE_TABLET | Freq: Every day | ORAL | 2 refills | Status: AC

## 2024-01-23 MED ORDER — PERFLUTREN LIPID MICROSPHERE
1.0000 mL | INTRAVENOUS | Status: DC | PRN
  Administered 2024-01-23: 3 mL via INTRAVENOUS

## 2024-01-23 MED ORDER — ROSUVASTATIN CALCIUM 40 MG PO TABS: 40.0000 mg | ORAL_TABLET | Freq: Every day | ORAL | 3 refills | Status: DC

## 2024-01-23 MED ORDER — CLOPIDOGREL BISULFATE 75 MG PO TABS: 75.0000 mg | ORAL_TABLET | Freq: Every day | ORAL | 3 refills | Status: AC

## 2024-01-23 MED ORDER — AMLODIPINE BESYLATE 5 MG PO TABS: 5.0000 mg | ORAL_TABLET | Freq: Every day | ORAL | 3 refills | Status: DC

## 2024-01-23 NOTE — Telephone Encounter (Signed)
  Pt canceled her echo appt tomorrow because she got it done at the hospital today. She was there because she had a stroke. She's asking if Dr. Rennis Golden can check her result

## 2024-01-23 NOTE — Progress Notes (Unsigned)
 Enrolled for [employer] to mail a ZIO XT long term holter monitor to the patients address on file.   Dr. Rennis Golden to read.

## 2024-01-23 NOTE — Progress Notes (Signed)
 Echocardiogram 2D Echocardiogram has been performed.  [name] 01/23/2024, 11:08 AM

## 2024-01-23 NOTE — Plan of Care (Signed)

## 2024-01-23 NOTE — TOC Transition Note (Signed)
 Transition of Care [facility] - Discharge Note   Patient Details  Name: [name] MRN: [id number] Date of Birth: July 14, 1956  [facility] of [facility] CM/SW Contact:  Kermit Balo, RN Phone Number: 01/23/2024, 11:23 AM   Clinical Narrative:     Pt is discharging home with self care. No follow up per therapy.  Pt has transportation home.  Final next level of care: Home/Self Care Barriers to Discharge: No Barriers Identified   Patient Goals and CMS Choice            Discharge Placement                       Discharge Plan and Services Additional resources added to the After Visit Summary for                                       Social Drivers of Health (SDOH) Interventions SDOH Screenings   Food Insecurity: No Food Insecurity (01/21/2024)  Housing: Low Risk  (01/21/2024)  Transportation Needs: No Transportation Needs (01/21/2024)  Utilities: Not At Risk (01/21/2024)  Alcohol Screen: Low Risk  (01/10/2023)  Depression (PHQ2-9): Low Risk  (01/10/2023)  Financial Resource Strain: Low Risk  (12/14/2023)  Physical Activity: Insufficiently Active (01/10/2023)  Social Connections: Moderately Integrated (01/21/2024)  Stress: No Stress Concern Present (01/10/2023)  Tobacco Use: Low Risk  (01/21/2024)     Readmission Risk Interventions     No data to display

## 2024-01-24 ENCOUNTER — Encounter: Payer: Self-pay | Admitting: Internal Medicine

## 2024-01-24 ENCOUNTER — Ambulatory Visit (HOSPITAL_COMMUNITY): Payer: 59

## 2024-01-24 ENCOUNTER — Encounter: Payer: 59 | Admitting: Obstetrics and Gynecology

## 2024-01-25 NOTE — Telephone Encounter (Signed)
 MD sent patient a message thru MyChart

## 2024-02-01 ENCOUNTER — Ambulatory Visit: Admitting: Internal Medicine

## 2024-02-01 VITALS — BP 120/52 | HR 100 | Temp 98.4°F | Ht 72.0 in | Wt 277.2 lb

## 2024-02-01 DIAGNOSIS — I10 Essential (primary) hypertension: Secondary | ICD-10-CM | POA: Diagnosis not present

## 2024-02-01 DIAGNOSIS — Z8673 Personal history of transient ischemic attack (TIA), and cerebral infarction without residual deficits: Secondary | ICD-10-CM

## 2024-02-01 DIAGNOSIS — E785 Hyperlipidemia, unspecified: Secondary | ICD-10-CM | POA: Diagnosis not present

## 2024-02-01 DIAGNOSIS — Z1231 Encounter for screening mammogram for malignant neoplasm of breast: Secondary | ICD-10-CM

## 2024-02-01 NOTE — Assessment & Plan Note (Addendum)
 She is here for hospital follow-up.  She feels like since getting out of the hospital her symptoms have continued to improve.  The main thing she notes is improvement in her energy in the last week.  She does not have any weakness in her upper or lower extremities.  She is expecting to have heart monitor delivered to the mail today or tomorrow.  We reviewed why we are doing a heart monitor for her.  In reviewing her medications that she has been taking since the hospital stay, we figured out she had not restarted losartan.  For blood pressure she is taking amlodipine 5 mg.  Blood pressure in clinic at 120/52.  She is going to pick up blood pressure cuff and start taking her blood pressures at home as well.  She denies dizziness and she is tolerating amlodipine well.  Other medications include aspirin 81 mg and Plavix 75 mg.  She has been taking both of these.  We talked about how she will only be on aspirin until June and will be able to stop that medication at that time. She is taking Crestor 40 mg and has not noticed any side effects from it.  P: Continue amlodipine 5 mg, aspirin 81 mg, Plavix 75 mg and Crestor 40 mg Losartan removed from medication list Follow-up in 6 to 8 weeks to repeat lipid panel Follow-up heart monitor

## 2024-02-01 NOTE — Progress Notes (Signed)
 Subjective:  CC: Hospital follow-up  HPI:  [name] is a 68 y.o. female with a past medical history of stroke, CAD s/p CABG who presents today for hospital follow-up.  She was admitted from 3/1 to 3/3 in setting of right sided facial droop and some right lower extremity weakness.  MRI showed a 13 mm infarct of left internal capsule. MR angio head showed moderate intracranial stenosis.  She was admitted to the hospital to undergo stroke evaluation.  Echo showed a small calcified mass in her left ventricle that was unchanged from 2015.  He was started on higher dose of cholesterol medication and discharged with DAPT for 3 months.  Please see problem based assessment and plan for additional details.  Past Medical History:  Diagnosis Date   Acute urinary retention s/p Foley 01/07/2012   Allergy    Anemia    Asthma    CAD (coronary artery disease)    a. 12/2013 s/p CABG x 5 Dr. Maren Beach (LIMA to LAD, SVG to diagonal, SVG to OM1, SVG to OM2, SVG to PDA)   Carotid arterial disease (HCC)    a. 12/2013 Carotid U/S: 1-39% bilat ICA stenosis.   Carpal tunnel syndrome, bilateral    GERD (gastroesophageal reflux disease)    Hemorrhoids, internal, with bleeding & prolapse 12/13/2011   Hernia, abdominal    History of blood transfusion    CABG and hysterectomy   History of kidney stones    Hyperlipidemia    Hypertension    Iron deficiency 12/07/2011   Keloid scar    a. Sternal Keloid s/p CABG with ongoing pain.   Morbid obesity (HCC)    Myocardial infarct (HCC)    2015   Neuropathy    Osteoarthritis    Osteoporosis    PONV (postoperative nausea and vomiting)    Seasonal allergies    Sleep apnea    no CPAP machine; sleep study 02/2010 REM AHI 61.7/hr, total sleep REM 14.8/hr   Tooth abscess    Uterine cancer (HCC) 05/2014   clinical stage IA grade 1 endometrioid endometrial cancer    MEDICATIONS:  Omeprazole 20 - as needed Losartan 100 mg- she has not been taking this  medication Rosuvastatin 40 mg Amlodipine 5 mg Plavix 75 mg  Vibegron 75 mg- rarely takes this medication Aspirin 81 mg  Family History  Problem Relation Age of Onset   Heart attack Mother 20   Heart disease Mother    Hypertension Mother    Diabetes Mother    Hypertension Sister    Heart failure Sister    Heart disease Sister    Diabetes Sister    Kidney disease Sister    Kidney disease Brother    Hypertension Daughter    Bipolar disorder Son    Schizophrenia Son    Anxiety disorder Son    Esophageal cancer Other        nephew   Colon cancer Neg Hx    Pancreatic cancer Neg Hx    Stomach cancer Neg Hx    Liver disease Neg Hx    Rectal cancer Neg Hx     Past Surgical History:  Procedure Laterality Date   ARTERY BIOPSY Left 07/10/2021   Procedure: LEFT TEMPORAL ARTERY BIOPSY;  Surgeon: Nada Libman, MD;  Location: [facility] OR;  Service: Vascular;  Laterality: Left;   bladder tack     BREAST REDUCTION SURGERY Bilateral 10/10/2015   Procedure: BILATERAL MAMMARY REDUCTION  (BREAST) WITH FREE NIPPLE GRAFT;  Surgeon: Glenna Fellows, MD;  Location: [facility] OR;  Service: Plastics;  Laterality: Bilateral;   CARDIAC CATHETERIZATION     CATARACT EXTRACTION Right    COLONOSCOPY  2009   COLONOSCOPY W/ BIOPSIES AND POLYPECTOMY     CORONARY ARTERY BYPASS GRAFT N/A 01/09/2014   Procedure: CORONARY ARTERY BYPASS GRAFTING (CABG) x 5 using left internal mammary artery and right leg greater saphenous vein harvested endoscopically;  Surgeon: Kerin Perna, MD;  Location: [facility] OR;  Service: Open Heart Surgery;  Laterality: N/A;  please use bed extenders and breast binder   INTRAOPERATIVE TRANSESOPHAGEAL ECHOCARDIOGRAM N/A 01/09/2014   Procedure: INTRAOPERATIVE TRANSESOPHAGEAL ECHOCARDIOGRAM;  Surgeon: Kerin Perna, MD;  Location: [facility] OR;  Service: Open Heart Surgery;  Laterality: N/A;   KNEE ARTHROSCOPY  1991   KNEE SURGERY Bilateral 1999   LEFT HEART CATHETERIZATION WITH CORONARY ANGIOGRAM N/A  01/04/2014   Procedure: LEFT HEART CATHETERIZATION WITH CORONARY ANGIOGRAM;  Surgeon: Lesleigh Noe, MD;  Location: [facility] CATH LAB;  Service: Cardiovascular;  Laterality: N/A;   LESION EXCISION WITH COMPLEX REPAIR N/A 05/07/2016   Procedure: COMPLEX REPAIR OF CHEST 16 CM;  Surgeon: Glenna Fellows, MD;  Location: [facility] OR;  Service: Plastics;  Laterality: N/A;   MULTIPLE EXTRACTIONS WITH ALVEOLOPLASTY N/A 04/11/2014   Procedure: Extraction of tooth #'s 1,2,8,16 with alveoloplasty, maxillary tuberosity reductions, and gross debridement of remaining teeth.;  Surgeon: Charlynne Pander, DDS;  Location: [facility] OR;  Service: Oral Surgery;  Laterality: N/A;   NM MYOCAR PERF WALL MOTION  01/2010   dipyridamole myoview - moderate perfusion defect in basal inferoseptal, basal inferior, mid inferoseptal, mid inferior, apical inferior region; EF 56%   TEE WITHOUT CARDIOVERSION  01/2010   EF 60-65%, small, flat, non-infiltrating, calcified, fixed apical/septal mass   TOTAL KNEE ARTHROPLASTY Left 04/29/2011   transanal hemorrhoidal dearterliaization  01/06/2012   with external hemorrhoid removal   UNILATERAL SALPINGECTOMY Right 06/03/2014   Procedure: UNILATERAL SALPINGECTOMY;  Surgeon: Willodean Rosenthal, MD;  Location: [facility] ORS;  Service: Gynecology;  Laterality: Right;   VAGINAL HYSTERECTOMY N/A 06/03/2014   Procedure: HYSTERECTOMY VAGINAL;  Surgeon: Willodean Rosenthal, MD;  Location: [facility] ORS;  Service: Gynecology;  Laterality: N/A;   WISDOM TOOTH EXTRACTION       Social History   Socioeconomic History   Marital status: Single    Spouse name: Not on file   Number of children: 2   Years of education: Not on file   Highest education level: Not on file  Occupational History   Occupation: Retired    Associate Professor: [employer]    Comment: Estate manager/land agent  Tobacco Use   Smoking status: Never    Passive exposure: Never   Smokeless tobacco: Never  Vaping Use   Vaping status: Never Used   Substance and Sexual Activity   Alcohol use: No    Alcohol/week: 0.0 standard drinks of alcohol   Drug use: No   Sexual activity: Not Currently  Other Topics Concern   Not on file  Social History Narrative   Current Social History 08/27/2020        Patient lives alone in a handicap-accessible ground floor apartment which is 1 story. There are not steps up to the entrance the patient uses.       Patient's method of transportation is personal car or family member.      The highest level of education was 2 years of college.      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 [employer]      Right Handed      Identified important Relationships are My grandchildren, daughter, son       Pets : None       Interests / Fun: Plants       Current Stressors: Son with special needs (Bi-polar/schizophrenia) , he is not living with her        Religious / Personal Beliefs: You Tube mentors (Vegan videos)      [place], RN,BSN          Social Drivers of Health   Financial Resource Strain: Low Risk  (12/14/2023)   Overall Financial Resource Strain (CARDIA)    Difficulty of Paying Living Expenses: Not hard at all  Food Insecurity: No Food Insecurity (01/21/2024)   Hunger Vital Sign    Worried About Running Out of Food in the Last Year: Never true    Ran Out of Food in the Last Year: Never true  Transportation Needs: No Transportation Needs (01/21/2024)   PRAPARE - Administrator, Civil Service (Medical): No    Lack of Transportation (Non-Medical): No  Physical Activity: Insufficiently Active (01/10/2023)   Exercise Vital Sign    Days of Exercise per Week: 2 days    Minutes of Exercise per Session: 10 min  Stress: No Stress Concern Present (01/10/2023)   [employer] of Occupational Health - Occupational Stress Questionnaire    Feeling of Stress : Only a little  Social Connections: Moderately Integrated (01/21/2024)   Social Connection and  Isolation Panel [NHANES]    Frequency of Communication with Friends and Family: More than three times a week    Frequency of Social Gatherings with Friends and Family: Once a week    Attends Religious Services: More than 4 times per year    Active Member of [employer] or Organizations: Yes    Attends Engineer, structural: More than 4 times per year    Marital Status: Divorced  Intimate Partner Violence: Not At Risk (01/21/2024)   Humiliation, Afraid, Rape, and Kick questionnaire    Fear of Current or Ex-Partner: No    Emotionally Abused: No    Physically Abused: No    Sexually Abused: No    Review of Systems: ROS negative except for what is noted on the assessment and plan.  Objective:   Vitals:   02/01/24 0908  BP: (!) 120/52  Pulse: 100  Temp: 98.4 F (36.9 C)  TempSrc: Oral  SpO2: 100%  Weight: 277 lb 3.2 oz (125.7 kg)  Height: 6' (1.829 m)    Physical Exam: Constitutional: well-appearing, in no acute distress Cardiovascular: regular rate and rhythm, no m/r/g Pulmonary/Chest: normal work of breathing on room air, lungs clear to auscultation bilaterally Abdominal: soft, non-tender, non-distended MSK: normal bulk and tone Neurological: Facial movement is symmetric, Uses cane for ambulation, 5 out of 5 strength in bilateral upper and lower extremities  Assessment & Plan:  History of stroke She is here for hospital follow-up.  She feels like since getting out of the hospital her symptoms have continued to improve.  The main thing she notes is improvement in her energy in the last week.  She does not have any weakness in her upper or lower extremities.  She is expecting to have heart monitor delivered to the mail today or tomorrow.  We reviewed why we are doing a heart monitor for her.  In reviewing her medications that she has been taking since the hospital  stay, we figured out she had not restarted losartan.  For blood pressure she is taking amlodipine 5 mg.  Blood  pressure in clinic at 120/52.  She is going to pick up blood pressure cuff and start taking her blood pressures at home as well.  She denies dizziness and she is tolerating amlodipine well.  Other medications include aspirin 81 mg and Plavix 75 mg.  She has been taking both of these.  We talked about how she will only be on aspirin until June and will be able to stop that medication at that time. She is taking Crestor 40 mg and has not noticed any side effects from it.  P: Continue amlodipine 5 mg, aspirin 81 mg, Plavix 75 mg and Crestor 40 mg Losartan removed from medication list Follow-up in 6 to 8 weeks to repeat lipid panel Follow-up heart monitor  Essential hypertension She was discharged from the hospital with losartan 100 mg and amlodipine 5 mg.  She is only been taking amlodipine 5 mg at home.  Her blood pressure is well-controlled at 120/53 today.  Losartan was removed from her medication list. Encouraged her to pick up of blood pressure cuff to monitor blood pressures at home.  Tolerating amlodipine well. P: Amlodipine 5 mg Losartan removed from medication list Follow-up in 6 to 8 weeks to recheck blood pressure   Hyperlipidemia Lab Results  Component Value Date   CHOL 170 01/22/2024   HDL 41 01/22/2024   LDLCALC 114 (H) 01/22/2024   TRIG 77 01/22/2024   CHOLHDL 4.1 01/22/2024   LDL at 114.  She started on Crestor 40 mg for secondary prevention.  Will plan to recheck lipid panel in 6 to 8 weeks.    Patient discussed with Dr. Wille Celeste Yacob Wilkerson, D.O. [facility]  PGY-3 Pager: [phone]  Phone: [phone] Date 02/01/2024  Time 11:46 AM

## 2024-02-01 NOTE — Patient Instructions (Signed)
 Thank you, [name] for allowing [place] to provide your care today.   History of Stroke I am so glad to hear that your symptoms are getting better from your stroke. Your blood pressure please continue at amlodipine.  If you can pick up a blood pressure cuff and start checking your pressures at home once daily that would be very helpful.  Bring those values into next office visit.  For cholesterol, please continue Crestor 40 mg.  You will be on Plavix lifelong and aspirin you will be on until June 1 of this year and then you can discontinue it.  Your heart monitor should be coming in the next few days.  This is to help [place] make sure your stroke was not from a heart rhythm problem.  I ordered screening mammogram for you.  I have ordered the following medication/changed the following medications:   Stop the following medications: Medications Discontinued During This Encounter  Medication Reason   losartan (COZAAR) 100 MG tablet Discontinued by provider     Start the following medications: No orders of the defined types were placed in this encounter.    Follow up: 2-3 months  We look forward to seeing you next time. Please call our clinic at [phone] if you have any questions or concerns. The best time to call is Monday-Friday from 9am-4pm, but there is someone available 24/7. If after hours or the weekend, call the main hospital number and ask for the Internal Medicine Resident On-Call. If you need medication refills, please notify your pharmacy one week in advance and they will send [place] a request.   Thank you for trusting me with your care. Wishing you the best!   Rudene Christians, DO [facility]

## 2024-02-01 NOTE — Assessment & Plan Note (Signed)
 Lab Results  Component Value Date   CHOL 170 01/22/2024   HDL 41 01/22/2024   LDLCALC 114 (H) 01/22/2024   TRIG 77 01/22/2024   CHOLHDL 4.1 01/22/2024   LDL at 114.  She started on Crestor 40 mg for secondary prevention.  Will plan to recheck lipid panel in 6 to 8 weeks.

## 2024-02-01 NOTE — Assessment & Plan Note (Addendum)
 She was discharged from the hospital with losartan 100 mg and amlodipine 5 mg.  She is only been taking amlodipine 5 mg at home.  Her blood pressure is well-controlled at 120/53 today.  Losartan was removed from her medication list. Encouraged her to pick up of blood pressure cuff to monitor blood pressures at home.  Tolerating amlodipine well. P: Amlodipine 5 mg Losartan removed from medication list Follow-up in 6 to 8 weeks to recheck blood pressure

## 2024-02-02 NOTE — Progress Notes (Signed)
 Internal Medicine Clinic Attending  Case discussed with the resident at the time of the visit.  We reviewed the resident's history and exam and pertinent patient test results.  I agree with the assessment, diagnosis, and plan of care documented in the resident's note.

## 2024-02-08 ENCOUNTER — Other Ambulatory Visit: Payer: Self-pay | Admitting: Student

## 2024-02-08 ENCOUNTER — Other Ambulatory Visit: Payer: Self-pay | Admitting: Internal Medicine

## 2024-02-08 MED ORDER — VITAMIN D 25 MCG (1000 UNIT) PO TABS: 1000.0000 [IU] | ORAL_TABLET | Freq: Every day | ORAL | 3 refills | Status: AC

## 2024-02-08 NOTE — Telephone Encounter (Signed)
 Patient prescribed 8 weeks of high-dose vitamin D. Will discontinue high-dose and start daily supplementation with 1000 international units daily. F/u levels at future visit.

## 2024-02-09 ENCOUNTER — Telehealth: Payer: Self-pay | Admitting: Obstetrics and Gynecology

## 2024-02-09 NOTE — Telephone Encounter (Signed)
 Called and left a message for patient. She is scheduled to come to the office tomorrow for a pessary check and Pre-op  She was admitted from 3/1 to 3/3 in setting of right sided facial droop and some right lower extremity weakness.  MRI showed a 13 mm infarct of left internal capsule. MR angio head showed moderate intracranial stenosis.  She was admitted to the hospital to undergo stroke evaluation.   We need to discuss safety of surgical intervention right now. It would be recommended to postpone surgery until after cardiac clearance and nero clearance has been determined.

## 2024-02-10 ENCOUNTER — Ambulatory Visit (INDEPENDENT_AMBULATORY_CARE_PROVIDER_SITE_OTHER): Payer: 59 | Admitting: Obstetrics and Gynecology

## 2024-02-10 ENCOUNTER — Encounter: Payer: Self-pay | Admitting: Obstetrics and Gynecology

## 2024-02-10 VITALS — BP 123/77 | HR 71

## 2024-02-10 DIAGNOSIS — N813 Complete uterovaginal prolapse: Secondary | ICD-10-CM | POA: Diagnosis not present

## 2024-02-10 DIAGNOSIS — N993 Prolapse of vaginal vault after hysterectomy: Secondary | ICD-10-CM

## 2024-02-10 DIAGNOSIS — N8110 Cystocele, unspecified: Secondary | ICD-10-CM

## 2024-02-10 NOTE — Progress Notes (Signed)
 [facility]   Subjective:     Chief Complaint: No chief complaint on file.  History of Present Illness: SHARLEE RUFINO is a 68 y.o. female with stage IV pelvic organ prolapse who presents for a pessary check. She is using a size #7 long stem gellhorn pessary. The pessary has been working well but she reports she was having some constipation. She is not using vaginal estrogen. She denies vaginal bleeding.  Past Medical History: Patient  has a past medical history of Acute urinary retention s/p Foley (01/07/2012), Allergy, Anemia, Asthma, CAD (coronary artery disease), Carotid arterial disease (HCC), Carpal tunnel syndrome, bilateral, GERD (gastroesophageal reflux disease), Hemorrhoids, internal, with bleeding & prolapse (12/13/2011), Hernia, abdominal, History of blood transfusion, History of kidney stones, Hyperlipidemia, Hypertension, Iron deficiency (12/07/2011), Keloid scar, Morbid obesity (HCC), Myocardial infarct (HCC), Neuropathy, Osteoarthritis, Osteoporosis, PONV (postoperative nausea and vomiting), Seasonal allergies, Sleep apnea, Tooth abscess, and Uterine cancer (HCC) (05/2014).   Past Surgical History: She  has a past surgical history that includes Knee surgery (Bilateral, 1999); Total knee arthroplasty (Left, 04/29/2011); Wisdom tooth extraction; Colonoscopy (2009); Knee arthroscopy (1991); transanal hemorrhoidal dearterliaization (01/06/2012); Coronary artery bypass graft (N/A, 01/09/2014); Intraoprative transesophageal echocardiogram (N/A, 01/09/2014); TEE without cardioversion (01/2010); NM MYOCAR PERF WALL MOTION (01/2010); Cardiac catheterization; Multiple extractions with alveoloplasty (N/A, 04/11/2014); Vaginal hysterectomy (N/A, 06/03/2014); Unilateral salpingectomy (Right, 06/03/2014); left heart catheterization with coronary angiogram (N/A, 01/04/2014); Breast reduction surgery (Bilateral, 10/10/2015); Colonoscopy w/ biopsies and polypectomy; Lesion excision with  complex repair (N/A, 05/07/2016); Cataract extraction (Right); Artery Biopsy (Left, 07/10/2021); and bladder tack.   Medications: She has a current medication list which includes the following prescription(s): albuterol, amlodipine, aspirin ec, cholecalciferol, clopidogrel, omeprazole, rosuvastatin, and vibegron.   Allergies: Patient is allergic to cephalosporins, eicosapentaenoic acid (epa) (fish), fish-derived products, flagyl [metronidazole], peanuts [peanut oil], shrimp [shellfish allergy], latex, cipro [ciprofloxacin hcl], diovan hct [valsartan-hydrochlorothiazide], methotrexate derivatives, aleve [naproxen], other, prednisone, and zestril [lisinopril].   Social History: Patient  reports that she has never smoked. She has never been exposed to tobacco smoke. She has never used smokeless tobacco. She reports that she does not drink alcohol and does not use drugs.      Objective:    Physical Exam: LMP 01/13/2014 Comment: spotting in Feb and bleeding since April Gen: No apparent distress, A&O x 3. Detailed Urogynecologic Evaluation:  Pelvic Exam: Normal external female genitalia; Bartholin's and Skene's glands normal in appearance; urethral meatus normal in appearance, no urethral masses or discharge. The pessary was noted to be dislodged. It was removed and cleaned. Speculum exam revealed no lesions in the vagina. The pessary was changed to a #5 Donut. It was comfortable to the patient and fit well.      Assessment/Plan:    Assessment: Ms. Quintin is a 67 y.o. with stage IV pelvic organ prolapse here for a pessary check. She is doing well.  Plan: She will keep the pessary in place until next visit. She will continue to use coconut oil. She will follow-up in 3 months for a pessary check or sooner as needed.  All questions were answered.

## 2024-02-14 ENCOUNTER — Ambulatory Visit: Admitting: Neurology

## 2024-02-14 ENCOUNTER — Encounter: Payer: Self-pay | Admitting: Neurology

## 2024-02-27 ENCOUNTER — Ambulatory Visit (HOSPITAL_COMMUNITY): Admit: 2024-02-27 | Payer: 59 | Admitting: Obstetrics and Gynecology

## 2024-02-27 SURGERY — ANTERIOR AND POSTERIOR REPAIR WITH SACROSPINOUS FIXATION
Anesthesia: General

## 2024-02-29 ENCOUNTER — Ambulatory Visit

## 2024-02-29 ENCOUNTER — Telehealth: Payer: Self-pay

## 2024-02-29 DIAGNOSIS — Z0000 Encounter for general adult medical examination without abnormal findings: Secondary | ICD-10-CM

## 2024-02-29 NOTE — Telephone Encounter (Signed)
 Good Morning  ,                           I have completed pt  AWV and sent it to you to sign off on thanks in advance

## 2024-02-29 NOTE — Progress Notes (Signed)
 Subjective:   [name] is a 68 y.o. female who presents for Medicare Annual (Subsequent) preventive examination.  Visit Complete: Virtual I connected with  [name] on 02/29/24 by a audio enabled telemedicine application and verified that I am speaking with the correct person using two identifiers.  Patient Location: Home  Provider Location: Office/Clinic  I discussed the limitations of evaluation and management by telemedicine. The patient expressed understanding and agreed to proceed.  Vital Signs: Because this visit was a virtual/telehealth visit, some criteria may be missing or patient reported. Any vitals not documented were not able to be obtained and vitals that have been documented are patient reported.         Objective:    There were no vitals filed for this visit. There is no height or weight on file to calculate BMI.     02/29/2024   10:12 AM 01/21/2024    2:05 PM 01/21/2024   11:43 AM 12/07/2023   11:27 PM 05/27/2023   10:57 AM 01/10/2023   11:34 AM 01/10/2023   10:04 AM  Advanced Directives  Does Patient Have a Medical Advance Directive? Yes No No No No No No  Type of Advance Directive Healthcare Power of Attorney        Does patient want to make changes to medical advance directive?      No - Patient declined No - Patient declined  Would patient like information on creating a medical advance directive?  No - Patient declined   No - Patient declined No - Patient declined No - Patient declined    Current Medications (verified) Outpatient Encounter Medications as of 02/29/2024  Medication Sig   albuterol (VENTOLIN HFA) 108 (90 Base) MCG/ACT inhaler Inhale 2 puffs into the lungs every 6 (six) hours as needed for wheezing or shortness of breath.   amLODipine (NORVASC) 5 MG tablet Take 1 tablet (5 mg total) by mouth daily.   aspirin EC 81 MG tablet Take 1 tablet (81 mg total) by mouth daily. Swallow whole.   cholecalciferol (VITAMIN D3) 25 MCG (1000 UNIT)  tablet Take 1 tablet (1,000 Units total) by mouth daily.   clopidogrel (PLAVIX) 75 MG tablet Take 1 tablet (75 mg total) by mouth daily.   omeprazole (PRILOSEC) 20 MG capsule TAKE 1 CAPSULE BY MOUTH EVERY DAY BEFORE BREAKFAST (Patient taking differently: Take 20 mg by mouth daily as needed (heartburn).)   rosuvastatin (CRESTOR) 40 MG tablet Take 1 tablet (40 mg total) by mouth daily.   Vibegron 75 MG TABS Take 1 tablet (75 mg total) by mouth daily. (Patient taking differently: Take 75 mg by mouth See admin instructions. Take 1 tablet (75mg ) 4 days a week on Monday, Wednesday, Friday, Saturday.)   No facility-administered encounter medications on file as of 02/29/2024.    Allergies (verified) Cephalosporins, Eicosapentaenoic acid (epa) (fish), Fish-derived products, Flagyl [metronidazole], Peanuts [peanut oil], Shrimp [shellfish allergy], Latex, Cipro [ciprofloxacin hcl], Diovan hct [valsartan-hydrochlorothiazide], Methotrexate derivatives, Aleve [naproxen], Other, Prednisone, and Zestril [lisinopril]   History: Past Medical History:  Diagnosis Date   Acute urinary retention s/p Foley 01/07/2012   Allergy    Anemia    Asthma    CAD (coronary artery disease)    a. 12/2013 s/p CABG x 5 Dr. Maren Beach (LIMA to LAD, SVG to diagonal, SVG to OM1, SVG to OM2, SVG to PDA)   Carotid arterial disease (HCC)    a. 12/2013 Carotid U/S: 1-39% bilat ICA stenosis.   Carpal tunnel syndrome,  bilateral    GERD (gastroesophageal reflux disease)    Hemorrhoids, internal, with bleeding & prolapse 12/13/2011   Hernia, abdominal    History of blood transfusion    CABG and hysterectomy   History of kidney stones    Hyperlipidemia    Hypertension    Iron deficiency 12/07/2011   Keloid scar    a. Sternal Keloid s/p CABG with ongoing pain.   Morbid obesity (HCC)    Myocardial infarct (HCC)    2015   Neuropathy    Osteoarthritis    Osteoporosis    PONV (postoperative nausea and vomiting)    Seasonal allergies     Sleep apnea    no CPAP machine; sleep study 02/2010 REM AHI 61.7/hr, total sleep REM 14.8/hr   Tooth abscess    Uterine cancer (HCC) 05/2014   clinical stage IA grade 1 endometrioid endometrial cancer   Past Surgical History:  Procedure Laterality Date   ARTERY BIOPSY Left 07/10/2021   Procedure: LEFT TEMPORAL ARTERY BIOPSY;  Surgeon: Nada Libman, MD;  Location: [facility] OR;  Service: Vascular;  Laterality: Left;   bladder tack     BREAST REDUCTION SURGERY Bilateral 10/10/2015   Procedure: BILATERAL MAMMARY REDUCTION  (BREAST) WITH FREE NIPPLE GRAFT;  Surgeon: Glenna Fellows, MD;  Location: [facility] OR;  Service: Plastics;  Laterality: Bilateral;   CARDIAC CATHETERIZATION     CATARACT EXTRACTION Right    COLONOSCOPY  2009   COLONOSCOPY W/ BIOPSIES AND POLYPECTOMY     CORONARY ARTERY BYPASS GRAFT N/A 01/09/2014   Procedure: CORONARY ARTERY BYPASS GRAFTING (CABG) x 5 using left internal mammary artery and right leg greater saphenous vein harvested endoscopically;  Surgeon: Kerin Perna, MD;  Location: [facility] OR;  Service: Open Heart Surgery;  Laterality: N/A;  please use bed extenders and breast binder   INTRAOPERATIVE TRANSESOPHAGEAL ECHOCARDIOGRAM N/A 01/09/2014   Procedure: INTRAOPERATIVE TRANSESOPHAGEAL ECHOCARDIOGRAM;  Surgeon: Kerin Perna, MD;  Location: [facility] OR;  Service: Open Heart Surgery;  Laterality: N/A;   KNEE ARTHROSCOPY  1991   KNEE SURGERY Bilateral 1999   LEFT HEART CATHETERIZATION WITH CORONARY ANGIOGRAM N/A 01/04/2014   Procedure: LEFT HEART CATHETERIZATION WITH CORONARY ANGIOGRAM;  Surgeon: Lesleigh Noe, MD;  Location: [facility] CATH LAB;  Service: Cardiovascular;  Laterality: N/A;   LESION EXCISION WITH COMPLEX REPAIR N/A 05/07/2016   Procedure: COMPLEX REPAIR OF CHEST 16 CM;  Surgeon: Glenna Fellows, MD;  Location: [facility] OR;  Service: Plastics;  Laterality: N/A;   MULTIPLE EXTRACTIONS WITH ALVEOLOPLASTY N/A 04/11/2014   Procedure: Extraction of tooth #'s 1,2,8,16 with  alveoloplasty, maxillary tuberosity reductions, and gross debridement of remaining teeth.;  Surgeon: Charlynne Pander, DDS;  Location: [facility] OR;  Service: Oral Surgery;  Laterality: N/A;   NM MYOCAR PERF WALL MOTION  01/2010   dipyridamole myoview - moderate perfusion defect in basal inferoseptal, basal inferior, mid inferoseptal, mid inferior, apical inferior region; EF 56%   TEE WITHOUT CARDIOVERSION  01/2010   EF 60-65%, small, flat, non-infiltrating, calcified, fixed apical/septal mass   TOTAL KNEE ARTHROPLASTY Left 04/29/2011   transanal hemorrhoidal dearterliaization  01/06/2012   with external hemorrhoid removal   UNILATERAL SALPINGECTOMY Right 06/03/2014   Procedure: UNILATERAL SALPINGECTOMY;  Surgeon: Willodean Rosenthal, MD;  Location: [facility] ORS;  Service: Gynecology;  Laterality: Right;   VAGINAL HYSTERECTOMY N/A 06/03/2014   Procedure: HYSTERECTOMY VAGINAL;  Surgeon: Willodean Rosenthal, MD;  Location: [facility] ORS;  Service: Gynecology;  Laterality: N/A;   WISDOM TOOTH EXTRACTION  Family History  Problem Relation Age of Onset   Heart attack Mother 49   Heart disease Mother    Hypertension Mother    Diabetes Mother    Hypertension Sister    Heart failure Sister    Heart disease Sister    Diabetes Sister    Kidney disease Sister    Kidney disease Brother    Hypertension Daughter    Bipolar disorder Son    Schizophrenia Son    Anxiety disorder Son    Esophageal cancer Other        nephew   Colon cancer Neg Hx    Pancreatic cancer Neg Hx    Stomach cancer Neg Hx    Liver disease Neg Hx    Rectal cancer Neg Hx    Social History   Socioeconomic History   Marital status: Single    Spouse name: Not on file   Number of children: 2   Years of education: Not on file   Highest education level: Not on file  Occupational History   Occupation: Retired    Associate Professor: [employer]    Comment: Estate manager/land agent  Tobacco Use   Smoking status: Never    Passive exposure:  Never   Smokeless tobacco: Never  Vaping Use   Vaping status: Never Used  Substance and Sexual Activity   Alcohol use: No    Alcohol/week: 0.0 standard drinks of alcohol   Drug use: No   Sexual activity: Not Currently  Other Topics Concern   Not on file  Social History Narrative   Current Social History 08/27/2020        Patient lives alone in a handicap-accessible ground floor apartment which is 1 story. There are not steps up to the entrance the patient uses.       Patient's method of transportation is personal car or family member.      The highest level of education was 2 years of college.      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 [employer]      Right Handed      Identified important Relationships are My grandchildren, daughter, son       Pets : None       Interests / Fun: Plants       Current Stressors: Son with special needs (Bi-polar/schizophrenia) , he is not living with her        Religious / Personal Beliefs: You Tube mentors (Vegan videos)      [place], RN,BSN          Social Drivers of Health   Financial Resource Strain: Low Risk  (02/29/2024)   Overall Financial Resource Strain (CARDIA)    Difficulty of Paying Living Expenses: Not hard at all  Food Insecurity: No Food Insecurity (02/29/2024)   Hunger Vital Sign    Worried About Running Out of Food in the Last Year: Never true    Ran Out of Food in the Last Year: Never true  Transportation Needs: No Transportation Needs (02/29/2024)   PRAPARE - Administrator, Civil Service (Medical): No    Lack of Transportation (Non-Medical): No  Physical Activity: Insufficiently Active (02/29/2024)   Exercise Vital Sign    Days of Exercise per Week: 2 days    Minutes of Exercise per Session: 10 min  Stress: No Stress Concern Present (02/29/2024)   [employer] of Occupational Health - Occupational Stress Questionnaire    Feeling of Stress : Not  at all  Social  Connections: Moderately Integrated (02/29/2024)   Social Connection and Isolation Panel [NHANES]    Frequency of Communication with Friends and Family: More than three times a week    Frequency of Social Gatherings with Friends and Family: More than three times a week    Attends Religious Services: More than 4 times per year    Active Member of [employer] or Organizations: Yes    Attends Engineer, structural: More than 4 times per year    Marital Status: Divorced    Tobacco Counseling Counseling given: Not Answered   Clinical Intake:  Pre-visit preparation completed: Yes  Pain : No/denies pain     Nutritional Risks: None Diabetes: No  How often do you need to have someone help you when you read instructions, pamphlets, or other written materials from your doctor or pharmacy?: 1 - Never What is the last grade level you completed in school?: SOME COLLEGE  Interpreter Needed?: No  Information entered by :: [facility] Abrahm Mancia   Activities of Daily Living    01/21/2024    6:00 PM 05/27/2023   10:58 AM  In your present state of health, do you have any difficulty performing the following activities:  Hearing? 0 1  Vision? 0 0  Difficulty concentrating or making decisions? 0 0  Walking or climbing stairs?  0  Dressing or bathing?  0  Doing errands, shopping? 0 0    Patient Care Team: Dickie La, MD as PCP - General (Internal Medicine) Rennis Golden Lisette Abu, MD as PCP - Cardiology (Cardiology) Louis Meckel, MD (Inactive) as Consulting Physician (Gastroenterology) Sallye Lat, MD as Consulting Physician (Ophthalmology) Frederico Hamman, MD as Consulting Physician (Orthopedic Surgery) Glendale Chard, DO as Consulting Physician (Neurology)  Indicate any recent Medical Services you may have received from other than [facility] providers in the past year (date may be approximate).     Assessment:   This is a routine wellness examination for [name].  Hearing/Vision screen No  results found.   Goals Addressed   None   Depression Screen    02/29/2024   10:12 AM 02/01/2024   12:19 PM 01/10/2023   11:50 AM 01/10/2023   11:35 AM 08/13/2022   10:57 AM 05/03/2022   11:54 AM 01/11/2022   10:03 AM  PHQ 2/9 Scores  PHQ - 2 Score 0 0 0 0 0 0 0  PHQ- 9 Score 1 1         Fall Risk    02/29/2024   10:14 AM 05/27/2023   10:58 AM 01/10/2023   11:34 AM 01/10/2023   10:03 AM 11/24/2022    2:39 PM  Fall Risk   Falls in the past year? 0 0 0 0 0  Number falls in past yr: 0 0 0 0 0  Injury with Fall? 0 0 0 0 0  Risk for fall due to : Impaired balance/gait  No Fall Risks  No Fall Risks  Follow up Falls evaluation completed;Falls prevention discussed Falls evaluation completed Falls evaluation completed;Falls prevention discussed Falls evaluation completed Falls evaluation completed    MEDICARE RISK AT HOME: Medicare Risk at Home Any stairs in or around the home?: No If so, are there any without handrails?: No Home free of loose throw rugs in walkways, pet beds, electrical cords, etc?: Yes Adequate lighting in your home to reduce risk of falls?: Yes Life alert?: No Use of a cane, walker or w/c?: Yes Grab bars in the bathroom?: Yes  Shower chair or bench in shower?: Yes Elevated toilet seat or a handicapped toilet?: Yes  TIMED UP AND GO:  Was the test performed?  No    Cognitive Function:        02/29/2024   10:15 AM 01/10/2023   11:35 AM 07/26/2021    4:51 PM  6CIT Screen  What Year? 0 points 0 points 0 points  What month? 0 points 0 points 0 points  What time? 0 points 0 points 0 points  Count back from 20 0 points 0 points 0 points  Months in reverse 0 points 0 points 0 points  Repeat phrase 0 points 0 points 6 points  Total Score 0 points 0 points 6 points    Immunizations Immunization History  Administered Date(s) Administered   Influenza Split 01/07/2012   Influenza Whole 10/31/2007, 09/18/2009, 09/25/2010   Influenza, Seasonal, Injecte, Preservative Fre  11/28/2012   Influenza,inj,Quad PF,6+ Mos 08/07/2013, 01/02/2014, 08/01/2015, 09/20/2016, 09/05/2017, 10/16/2018   Influenza-Unspecified 09/07/2021   PFIZER(Purple Top)SARS-COV-2 Vaccination 04/10/2020, 05/05/2020   Pneumococcal Conjugate-13 07/14/2020   Pneumococcal Polysaccharide-23 01/07/2012, 10/24/2017   Td 10/15/2009   Tdap 02/23/2021    TDAP status: Up to date  Flu Vaccine status: Up to date  Pneumococcal vaccine status: Declined,  Education has been provided regarding the importance of this vaccine but patient still declined. Advised may receive this vaccine at local pharmacy or Health Dept. Aware to provide a copy of the vaccination record if obtained from local pharmacy or Health Dept. Verbalized acceptance and understanding.   Covid-19 vaccine status: Completed vaccines  Qualifies for Shingles Vaccine? Yes   Zostavax completed No   Shingrix Completed?: No.    Education has been provided regarding the importance of this vaccine. Patient has been advised to call insurance company to determine out of pocket expense if they have not yet received this vaccine. Advised may also receive vaccine at local pharmacy or Health Dept. Verbalized acceptance and understanding.  Screening Tests Health Maintenance  Topic Date Due   Zoster Vaccines- Shingrix (1 of 2) Never done   COVID-19 Vaccine (3 - Pfizer risk series) 06/02/2020   MAMMOGRAM  01/06/2024   Pneumonia Vaccine 75+ Years old (3 of 3 - PPSV23 or PCV20) 07/14/2025 (Originally 10/24/2022)   INFLUENZA VACCINE  06/22/2024   Medicare Annual Wellness (AWV)  02/28/2025   Colonoscopy  09/16/2027   DTaP/Tdap/Td (3 - Td or Tdap) 02/24/2031   DEXA SCAN  Completed   Hepatitis C Screening  Completed   HPV VACCINES  Aged Out    Health Maintenance  Health Maintenance Due  Topic Date Due   Zoster Vaccines- Shingrix (1 of 2) Never done   COVID-19 Vaccine (3 - Pfizer risk series) 06/02/2020   MAMMOGRAM  01/06/2024    Colorectal  cancer screening: Type of screening: Colonoscopy. Completed 10/25//2021. Repeat every 7 years  Mammogram status: Completed 01/05/22. Repeat every year  Bone Density status: Completed 02/18/2010. Results reflect: Bone density results: NORMAL. Repeat every 10 years.  Lung Cancer Screening: (Low Dose CT Chest recommended if Age 57-80 years, 20 pack-year currently smoking OR have quit w/in 15years.) does not qualify.   Lung Cancer Screening Referral: DEFERRED TO PCP   Additional Screening:  Hepatitis C Screening: does qualify; Completed 09/05/2017  Vision Screening: Recommended annual ophthalmology exams for early detection of glaucoma and other disorders of the eye. Is the patient up to date with their annual eye exam?  Yes  Who is the provider or what is the  name of the office in which the patient attends annual eye exams? DR Dione Booze  If pt is not established with a provider, would they like to be referred to a provider to establish care? No .   Dental Screening: Recommended annual dental exams for proper oral hygiene  Diabetic Foot Exam: N/A  Community Resource Referral / Chronic Care Management: CRR required this visit?  No   CCM required this visit?  No     Plan:     I have personally reviewed and noted the following in the patient's chart:   Medical and social history Use of alcohol, tobacco or illicit drugs  Current medications and supplements including opioid prescriptions. Patient is not currently taking opioid prescriptions. Functional ability and status Nutritional status Physical activity Advanced directives List of other physicians Hospitalizations, surgeries, and ER visits in previous 12 months Vitals Screenings to include cognitive, depression, and falls Referrals and appointments  In addition, I have reviewed and discussed with patient certain preventive protocols, quality metrics, and best practice recommendations. A written personalized care plan for  preventive services as well as general preventive health recommendations were provided to patient.     Derrell Lolling, CMA   02/29/2024   After Visit Summary: (MyChart) Due to this being a telephonic visit, the after visit summary with patients personalized plan was offered to patient via MyChart   Nurse Notes: NON FACE  TO  FACE

## 2024-03-22 ENCOUNTER — Ambulatory Visit (HOSPITAL_COMMUNITY)
Admission: EM | Admit: 2024-03-22 | Discharge: 2024-03-22 | Disposition: A | Discharge status: Home / Self Care | Attending: Emergency Medicine | Admitting: Emergency Medicine

## 2024-03-22 ENCOUNTER — Other Ambulatory Visit: Payer: Self-pay

## 2024-03-22 ENCOUNTER — Encounter (HOSPITAL_COMMUNITY): Payer: Self-pay

## 2024-03-22 DIAGNOSIS — R220 Localized swelling, mass and lump, head: Secondary | ICD-10-CM

## 2024-03-22 DIAGNOSIS — K047 Periapical abscess without sinus: Secondary | ICD-10-CM | POA: Diagnosis not present

## 2024-03-22 MED ORDER — AMOXICILLIN-POT CLAVULANATE 875-125 MG PO TABS: 1.0000 | ORAL_TABLET | Freq: Two times a day (BID) | ORAL | 0 refills | Status: DC

## 2024-03-22 MED ORDER — NYSTATIN 100000 UNIT/ML MT SUSP: 5.0000 mL | Freq: Three times a day (TID) | OROMUCOSAL | 0 refills | Status: DC | PRN

## 2024-03-22 MED ORDER — CHLORHEXIDINE GLUCONATE 0.12 % MT SOLN: 15.0000 mL | Freq: Two times a day (BID) | OROMUCOSAL | 0 refills | Status: DC

## 2024-03-22 NOTE — ED Provider Notes (Signed)
 [facility]    CSN: [id number] Arrival date & time: 03/22/24  1651      History   Chief Complaint Chief Complaint  Patient presents with   Oral Swelling    HPI [name] is a 68 y.o. female.   Patient presents with progressively worsening pain, swelling and pain noted to right upper and lower mouth x 3 weeks.  Patient denies any significant bleeding.  Patient states that she knows she needs to have some more teeth removed, but has been unable to get into the dentist.   Patient reports that she normally wears a partial denture and is unable to do so due to the swelling.  Patient was in the hospital about a month ago for a stroke and was started on Plavix , and is concerned that this could be related.  Patient states that she has been swishing Listerine, coconut oil, salt water , and castor oil with little relief.  Patient states that she also took Aleve  1 day which helped a little bit.  The history is provided by the patient and medical records.    Past Medical History:  Diagnosis Date   Acute urinary retention s/p Foley 01/07/2012   Allergy    Anemia    Asthma    CAD (coronary artery disease)    a. 12/2013 s/p CABG x 5 Dr. Jeb Miner (LIMA to LAD, SVG to diagonal, SVG to OM1, SVG to OM2, SVG to PDA)   Carotid arterial disease (HCC)    a. 12/2013 Carotid U/S: 1-39% bilat ICA stenosis.   Carpal tunnel syndrome, bilateral    GERD (gastroesophageal reflux disease)    Hemorrhoids, internal, with bleeding & prolapse 12/13/2011   Hernia, abdominal    History of blood transfusion    CABG and hysterectomy   History of kidney stones    Hyperlipidemia    Hypertension    Iron  deficiency 12/07/2011   Keloid scar    a. Sternal Keloid s/p CABG with ongoing pain.   Morbid obesity (HCC)    Myocardial infarct (HCC)    2015   Neuropathy    Osteoarthritis    Osteoporosis    PONV (postoperative nausea and vomiting)    Seasonal allergies    Sleep apnea    no CPAP  machine; sleep study 02/2010 REM AHI 61.7/hr, total sleep REM 14.8/hr   Tooth abscess    Uterine cancer (HCC) 05/2014   clinical stage IA grade 1 endometrioid endometrial cancer    Patient Active Problem List   Diagnosis Date Noted   History of stroke 01/21/2024   Other supraventricular tachycardia (HCC) 12/16/2023   Hearing loss of both ears due to cerumen impaction [H61.23] 05/27/2023   Polyarthritis with positive rheumatoid factor (HCC) 03/31/2022   High risk medication use 03/31/2022   Tendinitis of both rotator cuffs 09/07/2021   Keloid scar of skin 09/07/2021   Pes planus of left foot 09/08/2020   Hallux valgus 09/08/2020   Glucose intolerance (impaired glucose tolerance) 07/14/2020   Vitamin D  deficiency 07/14/2020   Depression 10/16/2018   Osteoarthritis of left shoulder 10/24/2017   Peripheral neuropathy 08/19/2017   Atherosclerosis of aorta (HCC) 06/20/2015   Osteoarthritis of right knee 11/07/2014   Health care maintenance 10/09/2014   Coronary atherosclerosis of native coronary artery 01/05/2014   Morbid obesity due to excess calories (HCC) 05/10/2012   Obstructive sleep apnea 03/27/2010   GERD 08/06/2008   Hyperlipidemia 10/31/2007   Essential hypertension 06/30/2007    Past Surgical History:  Procedure Laterality Date   ARTERY BIOPSY Left 07/10/2021   Procedure: LEFT TEMPORAL ARTERY BIOPSY;  Surgeon: Margherita Shell, MD;  Location: [facility] OR;  Service: Vascular;  Laterality: Left;   bladder tack     BREAST REDUCTION SURGERY Bilateral 10/10/2015   Procedure: BILATERAL MAMMARY REDUCTION  (BREAST) WITH FREE NIPPLE GRAFT;  Surgeon: Alger Infield, MD;  Location: [facility] OR;  Service: Plastics;  Laterality: Bilateral;   CARDIAC CATHETERIZATION     CATARACT EXTRACTION Right    COLONOSCOPY  2009   COLONOSCOPY W/ BIOPSIES AND POLYPECTOMY     CORONARY ARTERY BYPASS GRAFT N/A 01/09/2014   Procedure: CORONARY ARTERY BYPASS GRAFTING (CABG) x 5 using left internal mammary  artery and right leg greater saphenous vein harvested endoscopically;  Surgeon: Heriberto London, MD;  Location: [facility] OR;  Service: Open Heart Surgery;  Laterality: N/A;  please use bed extenders and breast binder   INTRAOPERATIVE TRANSESOPHAGEAL ECHOCARDIOGRAM N/A 01/09/2014   Procedure: INTRAOPERATIVE TRANSESOPHAGEAL ECHOCARDIOGRAM;  Surgeon: Heriberto London, MD;  Location: [facility] OR;  Service: Open Heart Surgery;  Laterality: N/A;   KNEE ARTHROSCOPY  1991   KNEE SURGERY Bilateral 1999   LEFT HEART CATHETERIZATION WITH CORONARY ANGIOGRAM N/A 01/04/2014   Procedure: LEFT HEART CATHETERIZATION WITH CORONARY ANGIOGRAM;  Surgeon: Mickiel Albany, MD;  Location: [facility] CATH LAB;  Service: Cardiovascular;  Laterality: N/A;   LESION EXCISION WITH COMPLEX REPAIR N/A 05/07/2016   Procedure: COMPLEX REPAIR OF CHEST 16 CM;  Surgeon: Alger Infield, MD;  Location: [facility] OR;  Service: Plastics;  Laterality: N/A;   MULTIPLE EXTRACTIONS WITH ALVEOLOPLASTY N/A 04/11/2014   Procedure: Extraction of tooth #'s 1,2,8,16 with alveoloplasty, maxillary tuberosity reductions, and gross debridement of remaining teeth.;  Surgeon: Carol Chroman, DDS;  Location: [facility] OR;  Service: Oral Surgery;  Laterality: N/A;   NM MYOCAR PERF WALL MOTION  01/2010   dipyridamole myoview  - moderate perfusion defect in basal inferoseptal, basal inferior, mid inferoseptal, mid inferior, apical inferior region; EF 56%   TEE WITHOUT CARDIOVERSION  01/2010   EF 60-65%, small, flat, non-infiltrating, calcified, fixed apical/septal mass   TOTAL KNEE ARTHROPLASTY Left 04/29/2011   transanal hemorrhoidal dearterliaization  01/06/2012   with external hemorrhoid removal   UNILATERAL SALPINGECTOMY Right 06/03/2014   Procedure: UNILATERAL SALPINGECTOMY;  Surgeon: Lenord Radon, MD;  Location: [facility] ORS;  Service: Gynecology;  Laterality: Right;   VAGINAL HYSTERECTOMY N/A 06/03/2014   Procedure: HYSTERECTOMY VAGINAL;  Surgeon: Lenord Radon,  MD;  Location: [facility] ORS;  Service: Gynecology;  Laterality: N/A;   WISDOM TOOTH EXTRACTION      OB History     Gravida  2   Para  2   Term  2   Preterm  0   AB  0   Living  2      SAB  0   IAB  0   Ectopic  0   Multiple  0   Live Births  2            Home Medications    Prior to Admission medications   Medication Sig Start Date End Date Taking? Authorizing Provider  amoxicillin -clavulanate (AUGMENTIN ) 875-125 MG tablet Take 1 tablet by mouth every 12 (twelve) hours. 03/22/24  Yes Rosevelt Constable, Sylwia Cuervo A, NP  chlorhexidine  (PERIDEX ) 0.12 % solution Use as directed 15 mLs in the mouth or throat 2 (two) times daily. 03/22/24  Yes Levora Reas A, NP  magic mouthwash (nystatin , lidocaine , diphenhydrAMINE ) suspension Take 5 mLs by mouth  3 (three) times daily as needed for mouth pain. 03/22/24  Yes Rosevelt Constable, Kimberlin Scheel A, NP  albuterol  (VENTOLIN  HFA) 108 (90 Base) MCG/ACT inhaler Inhale 2 puffs into the lungs every 6 (six) hours as needed for wheezing or shortness of breath. 12/28/22   Ether Hercules, MD  amLODipine  (NORVASC ) 5 MG tablet Take 1 tablet (5 mg total) by mouth daily. 01/23/24 01/22/25  Masters, Katie, DO  aspirin  EC 81 MG tablet Take 1 tablet (81 mg total) by mouth daily. Swallow whole. 01/24/24 04/22/24  Masters, Katie, DO  cholecalciferol (VITAMIN D3) 25 MCG (1000 UNIT) tablet Take 1 tablet (1,000 Units total) by mouth daily. 02/08/24   Bevelyn Bryant, MD  clopidogrel  (PLAVIX ) 75 MG tablet Take 1 tablet (75 mg total) by mouth daily. 01/23/24   Masters, Katie, DO  omeprazole  (PRILOSEC) 20 MG capsule TAKE 1 CAPSULE BY MOUTH EVERY DAY BEFORE BREAKFAST Patient taking differently: Take 20 mg by mouth daily as needed (heartburn). 07/13/23   Arlee Bellows, NP  rosuvastatin  (CRESTOR ) 40 MG tablet Take 1 tablet (40 mg total) by mouth daily. 01/23/24   Masters, Katie, DO  Vibegron  75 MG TABS Take 1 tablet (75 mg total) by mouth daily. Patient taking differently: Take 75 mg by mouth See  admin instructions. Take 1 tablet (75mg ) 4 days a week on Monday, Wednesday, Friday, Saturday. 10/31/23   Zuleta, Kaitlin G, NP    Family History Family History  Problem Relation Age of Onset   Heart attack Mother 12   Heart disease Mother    Hypertension Mother    Diabetes Mother    Hypertension Sister    Heart failure Sister    Heart disease Sister    Diabetes Sister    Kidney disease Sister    Kidney disease Brother    Hypertension Daughter    Bipolar disorder Son    Schizophrenia Son    Anxiety disorder Son    Esophageal cancer Other        nephew   Colon cancer Neg Hx    Pancreatic cancer Neg Hx    Stomach cancer Neg Hx    Liver disease Neg Hx    Rectal cancer Neg Hx     Social History Social History   Tobacco Use   Smoking status: Never    Passive exposure: Never   Smokeless tobacco: Never  Vaping Use   Vaping status: Never Used  Substance Use Topics   Alcohol  use: No    Alcohol /week: 0.0 standard drinks of alcohol    Drug use: No     Allergies   Cephalosporins, Eicosapentaenoic acid (epa) (fish), Fish-derived products, Flagyl  [metronidazole ], Peanuts [peanut oil], Shrimp [shellfish allergy], Latex, Cipro  [ciprofloxacin  hcl], Diovan  hct [valsartan -hydrochlorothiazide ], Methotrexate  derivatives, Aleve  [naproxen ], Other, Prednisone , and Zestril  [lisinopril ]   Review of Systems Review of Systems  Per HPI  Physical Exam Triage Vital Signs ED Triage Vitals  Encounter Vitals Group     BP 03/22/24 1730 136/71     Systolic BP Percentile --      Diastolic BP Percentile --      Pulse Rate 03/22/24 1730 76     Resp 03/22/24 1730 18     Temp 03/22/24 1730 98.1 F (36.7 C)     Temp Source 03/22/24 1730 Oral     SpO2 03/22/24 1730 95 %     Weight --      Height --      Head Circumference --      Peak Flow --  Pain Score 03/22/24 1729 6     Pain Loc --      Pain Education --      Exclude from Growth Chart --    No data found.  Updated Vital  Signs BP 136/71 (BP Location: Right Arm)   Pulse 76   Temp 98.1 F (36.7 C) (Oral)   Resp 18   LMP 01/13/2014 Comment: spotting in Feb and bleeding since April  SpO2 95%   Visual Acuity Right Eye Distance:   Left Eye Distance:   Bilateral Distance:    Right Eye Near:   Left Eye Near:    Bilateral Near:     Physical Exam Vitals and nursing note reviewed.  Constitutional:      General: She is awake. She is not in acute distress.    Appearance: Normal appearance. She is well-developed and well-groomed. She is not ill-appearing.  HENT:     Mouth/Throat:     Mouth: Mucous membranes are moist.     Dentition: Abnormal dentition. Does not have dentures. Dental tenderness, gingival swelling and dental caries present. No dental abscesses.     Comments: Lower molars appear to have dental caries present.  Upper right teeth have been removed but there is some swelling noted to the upper and lower gums.  No obvious presence of abscess. Skin:    General: Skin is warm and dry.  Neurological:     Mental Status: She is alert.  Psychiatric:        Behavior: Behavior is cooperative.      UC Treatments / Results  Labs (all labs ordered are listed, but only abnormal results are displayed) Labs Reviewed - No data to display  EKG   Radiology No results found.  Procedures Procedures (including critical care time)  Medications Ordered in UC Medications - No data to display  Initial Impression / Assessment and Plan / UC Course  I have reviewed the triage vital signs and the nursing notes.  Pertinent labs & imaging results that were available during my care of the patient were reviewed by me and considered in my medical decision making (see chart for details).     Patient is well-appearing.  Vitals are stable.  Upon assessment the lower molars appear to have dental caries present.  Appropriate teeth have been removed but there is some swelling noted to the upper and lower gums.  No  obvious presence of abscess.  Prescribed Augmentin  for dental infection coverage.  Prescribed chlorhexidine  and Magic mouthwash for additional relief.  Given dental resources to follow-up with.  Discussed follow-up and return precautions. Final Clinical Impressions(s) / UC Diagnoses   Final diagnoses:  Swelling of gums  Dental infection     Discharge Instructions      Start taking Augmentin  twice daily for 7 days for dental infection coverage. Use chlorhexidine  mouthwash twice daily to help with further infection prevention. Use Magic mouthwash 3 times daily to help with pain and inflammation. I have attached some dental resources that you can follow-up with regarding your dental concerns. Return here as needed.   ED Prescriptions     Medication Sig Dispense Auth. Provider   amoxicillin -clavulanate (AUGMENTIN ) 875-125 MG tablet Take 1 tablet by mouth every 12 (twelve) hours. 14 tablet Levora Reas A, NP   magic mouthwash (nystatin , lidocaine , diphenhydrAMINE ) suspension Take 5 mLs by mouth 3 (three) times daily as needed for mouth pain. 180 mL Levora Reas A, NP   chlorhexidine  (PERIDEX ) 0.12 % solution Use  as directed 15 mLs in the mouth or throat 2 (two) times daily. 120 mL Levora Reas A, NP      PDMP not reviewed this encounter.   Levora Reas A, NP 03/22/24 [phone]

## 2024-03-22 NOTE — ED Triage Notes (Signed)
 Pt states about 3 weeks ago she started having gum swelling. Pt states that last month she had a stroke and is wondering if it has something to do with that. Pt denies any current stroke sx's. Pt has tried Listerine, coconut oil, salt water , and caster oil with little relief. Pt also took Aleve  one day and said that helped a little.

## 2024-03-22 NOTE — Discharge Instructions (Addendum)
 Start taking Augmentin  twice daily for 7 days for dental infection coverage. Use chlorhexidine  mouthwash twice daily to help with further infection prevention. Use Magic mouthwash 3 times daily to help with pain and inflammation. I have attached some dental resources that you can follow-up with regarding your dental concerns. Return here as needed.

## 2024-03-23 ENCOUNTER — Other Ambulatory Visit: Payer: Self-pay | Admitting: Nurse Practitioner

## 2024-04-03 ENCOUNTER — Encounter: Payer: Self-pay | Admitting: Internal Medicine

## 2024-04-03 ENCOUNTER — Ambulatory Visit (INDEPENDENT_AMBULATORY_CARE_PROVIDER_SITE_OTHER): Admitting: Internal Medicine

## 2024-04-03 VITALS — BP 114/50 | HR 62 | Temp 98.0°F | Ht 72.0 in | Wt 279.7 lb

## 2024-04-03 DIAGNOSIS — I700 Atherosclerosis of aorta: Secondary | ICD-10-CM

## 2024-04-03 DIAGNOSIS — G4733 Obstructive sleep apnea (adult) (pediatric): Secondary | ICD-10-CM | POA: Diagnosis not present

## 2024-04-03 DIAGNOSIS — K089 Disorder of teeth and supporting structures, unspecified: Secondary | ICD-10-CM | POA: Diagnosis not present

## 2024-04-03 DIAGNOSIS — Z8639 Personal history of other endocrine, nutritional and metabolic disease: Secondary | ICD-10-CM | POA: Diagnosis not present

## 2024-04-03 DIAGNOSIS — F321 Major depressive disorder, single episode, moderate: Secondary | ICD-10-CM

## 2024-04-03 DIAGNOSIS — I10 Essential (primary) hypertension: Secondary | ICD-10-CM | POA: Diagnosis not present

## 2024-04-03 DIAGNOSIS — E785 Hyperlipidemia, unspecified: Secondary | ICD-10-CM | POA: Diagnosis not present

## 2024-04-03 DIAGNOSIS — E559 Vitamin D deficiency, unspecified: Secondary | ICD-10-CM | POA: Diagnosis not present

## 2024-04-03 DIAGNOSIS — Z8709 Personal history of other diseases of the respiratory system: Secondary | ICD-10-CM

## 2024-04-03 DIAGNOSIS — I4719 Other supraventricular tachycardia: Secondary | ICD-10-CM | POA: Diagnosis not present

## 2024-04-03 DIAGNOSIS — I2510 Atherosclerotic heart disease of native coronary artery without angina pectoris: Secondary | ICD-10-CM | POA: Diagnosis not present

## 2024-04-03 DIAGNOSIS — Z8673 Personal history of transient ischemic attack (TIA), and cerebral infarction without residual deficits: Secondary | ICD-10-CM | POA: Diagnosis not present

## 2024-04-03 DIAGNOSIS — M0580 Other rheumatoid arthritis with rheumatoid factor of unspecified site: Secondary | ICD-10-CM

## 2024-04-03 DIAGNOSIS — Z1231 Encounter for screening mammogram for malignant neoplasm of breast: Secondary | ICD-10-CM

## 2024-04-03 DIAGNOSIS — K219 Gastro-esophageal reflux disease without esophagitis: Secondary | ICD-10-CM

## 2024-04-03 MED ORDER — ALBUTEROL SULFATE HFA 108 (90 BASE) MCG/ACT IN AERS: 2.0000 | INHALATION_SPRAY | Freq: Four times a day (QID) | RESPIRATORY_TRACT | 0 refills | Status: AC | PRN

## 2024-04-03 NOTE — Assessment & Plan Note (Signed)
 Chronic and stable. No chest pain and good functional status. Now on DAPT for 3 months for recent CVA, plan to continue clopidogrel  following. Not currently on beta-blocker. F/u with cardiology planned for tomorrow, 5/14. Continue aspirin  81 mg, plavix  75 mg, rosuvastatin  20 mg.

## 2024-04-03 NOTE — Assessment & Plan Note (Signed)
 Continued GERD symptoms, not significantly improved with omeprazole  20 mg daily. Taking TUMS daily for additional relief. Increase to 40 mg daily.

## 2024-04-03 NOTE — Assessment & Plan Note (Signed)
 Ischemic stroke in 01/2024. Symptoms of facial droop and dysarthria have entirely resolved. She continues on aspirin  and plavix  for 3 months, followed by plavix  monotherapy. Remains on high-intensity statin. BP at goal.   Plan -Continue DAPT for 3 months, followed by Plavix  monotherapy -Continue rosuvastatin  20 mg daily, recheck lipids -F/u with cardiology re: heart monitor

## 2024-04-03 NOTE — Patient Instructions (Addendum)
 It was wonderful to see you today!  Please contact [facility] to schedule your mammogram. [phone]   Please call the rheumatology office, Dr. Rodell Citrin, to schedule a f/u appointment.  Follow-up with an oral surgeon to discuss your tooth pain.  *For exercise classes (in-person and online), group activities, and more* (50+)            [email or web address]  Please contact your pharmacy about scheduling the shingles (Shingrix) vaccine.

## 2024-04-03 NOTE — Assessment & Plan Note (Signed)
 Very mild OSA on sleep study in 2018. Not currently using CPAP and no sleep concerns. CTM.

## 2024-04-03 NOTE — Assessment & Plan Note (Signed)
 Started on Crestor  at prior office visit. Patient reports taking 20 mg daily. She has been having joint pains that she suspects are secondary to this medication. We discussed the importance of cholesterol management in prevention of stroke and heart disease. Joint pain unlikely to be secondary to statin given other clinical explanations. Will f/u lipid panel today and discuss options moving forward.

## 2024-04-03 NOTE — Progress Notes (Signed)
 Established Patient Office Visit  Subjective   Patient ID: [name], female    DOB: February 23, 1956  Age: 68 y.o. MRN: [id number]  Chief Complaint  Patient presents with   Follow-up    Bone ache -?cholesterol. Mammogram referral.    [name] returns to clinic today for follow-up of chronic medical conditions and first visit with me. Please see assessment/plan in problem-based charting for further details of today's visit.    Patient Active Problem List   Diagnosis Date Noted   History of stroke 01/21/2024   Other supraventricular tachycardia (HCC) 12/16/2023   Hearing loss of both ears due to cerumen impaction [H61.23] 05/27/2023   Polyarthritis with positive rheumatoid factor (HCC) 03/31/2022   High risk medication use 03/31/2022   Tendinitis of both rotator cuffs 09/07/2021   Keloid scar of skin 09/07/2021   Pes planus of left foot 09/08/2020   Hallux valgus 09/08/2020   Glucose intolerance (impaired glucose tolerance) 07/14/2020   Vitamin D  deficiency 07/14/2020   Depression 10/16/2018   Osteoarthritis of left shoulder 10/24/2017   Peripheral neuropathy 08/19/2017   Atherosclerosis of aorta (HCC) 06/20/2015   Osteoarthritis of right knee 11/07/2014   Health care maintenance 10/09/2014   Coronary atherosclerosis of native coronary artery 01/05/2014   Morbid obesity due to excess calories (HCC) 05/10/2012   Obstructive sleep apnea 03/27/2010   Poor dentition 01/21/2009   GERD 08/06/2008   Hyperlipidemia 10/31/2007   Essential hypertension 06/30/2007      Objective:     BP (!) 114/50 (BP Location: Right Arm, Patient Position: Sitting, Cuff Size: Large)   Pulse 62   Temp 98 F (36.7 C) (Oral)   Ht 6' (1.829 m)   Wt 279 lb 11.2 oz (126.9 kg)   LMP 01/13/2014 Comment: spotting in Feb and bleeding since April  SpO2 100% Comment: RA  BMI 37.93 kg/m  BP Readings from Last 3 Encounters:  04/03/24 (!) 114/50  03/22/24 136/71  02/10/24 123/77   Wt Readings  from Last 3 Encounters:  04/03/24 279 lb 11.2 oz (126.9 kg)  02/01/24 277 lb 3.2 oz (125.7 kg)  01/22/24 275 lb 6.4 oz (124.9 kg)    Physical Exam Constitutional:      General: She is not in acute distress.    Appearance: Normal appearance. She is not toxic-appearing.  Cardiovascular:     Rate and Rhythm: Normal rate and regular rhythm.     Heart sounds: Normal heart sounds.  Pulmonary:     Effort: Pulmonary effort is normal.  Musculoskeletal:        General: No swelling.  Neurological:     General: No focal deficit present.     Mental Status: She is alert.  Psychiatric:        Mood and Affect: Mood normal.        Behavior: Behavior normal.       Assessment & Plan:   Problem List Items Addressed This Visit       Cardiovascular and Mediastinum   Essential hypertension (Chronic)   Chronic and stable on amlodipine  monotherapy. BP at goal today, 114/50. No side effects. Continue amlodipine  5 mg.      Relevant Medications   rosuvastatin  (CRESTOR ) 20 MG tablet   Coronary atherosclerosis of native coronary artery (Chronic)   Chronic and stable. No chest pain and good functional status. Now on DAPT for 3 months for recent CVA, plan to continue clopidogrel  following. Not currently on beta-blocker. F/u with cardiology planned for tomorrow,  5/14. Continue aspirin  81 mg, plavix  75 mg, rosuvastatin  20 mg.      Relevant Medications   rosuvastatin  (CRESTOR ) 20 MG tablet   Atherosclerosis of aorta (HCC) (Chronic)   Seen on imaging. Diffuse atherosclerotic disease c/b CAD s/p CABG and stroke. Continue DAPT for 3 months, then Plavix  monotherapy. Continue statin.      Relevant Medications   rosuvastatin  (CRESTOR ) 20 MG tablet   Other supraventricular tachycardia (HCC)   Heart regular rate and rhythm on today's examination. Cardiac monitor ordered and returned, yet to be read. F/u with cardiology tomorrow, 5/14.      Relevant Medications   rosuvastatin  (CRESTOR ) 20 MG tablet      Respiratory   Obstructive sleep apnea (Chronic)   Very mild OSA on sleep study in 2018. Not currently using CPAP and no sleep concerns. CTM.        Digestive   GERD (Chronic)   Continued GERD symptoms, not significantly improved with omeprazole  20 mg daily. Taking TUMS daily for additional relief. Increase to 40 mg daily.       Poor dentition   Seen in UC for dental infection, treated with amoxicillin . [name] was only able to take four days of 7 day course due to development of a yeast infection. She has not yet established with an oral surgeon but is planning to call today due to several concerns. She would like to have all bottom teeth remove and concerns regarding upper partial dentures. Encouraged her to follow-up.        Musculoskeletal and Integument   Polyarthritis with positive rheumatoid factor (HCC) (Chronic)   She has not seen rheumatology, Dr. Rodell Citrin, in some time. She is no longer taking hydroxychloroquine . No specific joint affected today but describes diffuse joint pain that she attributes to rosuvastatin . I have asked her to make an appointment with Dr. Rodell Citrin and she is willing to return for an appointment.         Other   Hyperlipidemia (Chronic)   Started on Crestor  at prior office visit. Patient reports taking 20 mg daily. She has been having joint pains that she suspects are secondary to this medication. We discussed the importance of cholesterol management in prevention of stroke and heart disease. Joint pain unlikely to be secondary to statin given other clinical explanations. Will f/u lipid panel today and discuss options moving forward.      Relevant Medications   rosuvastatin  (CRESTOR ) 20 MG tablet   Other Relevant Orders   Lipid Profile   Morbid obesity due to excess calories (HCC) (Chronic)   BMI 37 with serious comorbidity of hypertension, stroke, and CAD. Decreased over the last several years but stable in the last year. Continue healthy activity and  nutrition.      Depression (Chronic)   Stable. PHQ9 of 1 during recent AWV. Not currently on therapy.      Vitamin D  deficiency (Chronic)   Vitamin D  9 at last check, started on supplementation. Recheck today.      History of stroke   Ischemic stroke in 01/2024. Symptoms of facial droop and dysarthria have entirely resolved. She continues on aspirin  and plavix  for 3 months, followed by plavix  monotherapy. Remains on high-intensity statin. BP at goal.   Plan -Continue DAPT for 3 months, followed by Plavix  monotherapy -Continue rosuvastatin  20 mg daily, recheck lipids -F/u with cardiology re: heart monitor      Other Visit Diagnoses       Screening mammogram for breast cancer    -  Primary     History of vitamin D  deficiency       Relevant Orders   VITAMIN D  25 Hydroxy (Vit-D Deficiency, Fractures)     History of asthma       Relevant Medications   albuterol  (VENTOLIN  HFA) 108 (90 Base) MCG/ACT inhaler       Return in about 6 months (around 10/04/2024).    Bevelyn Bryant, MD

## 2024-04-03 NOTE — Assessment & Plan Note (Signed)
 Chronic and stable on amlodipine  monotherapy. BP at goal today, 114/50. No side effects. Continue amlodipine  5 mg.

## 2024-04-03 NOTE — Assessment & Plan Note (Signed)
 Seen on imaging. Diffuse atherosclerotic disease c/b CAD s/p CABG and stroke. Continue DAPT for 3 months, then Plavix  monotherapy. Continue statin.

## 2024-04-03 NOTE — Assessment & Plan Note (Signed)
 She has not seen rheumatology, Dr. Rodell Citrin, in some time. She is no longer taking hydroxychloroquine . No specific joint affected today but describes diffuse joint pain that she attributes to rosuvastatin . I have asked her to make an appointment with Dr. Rodell Citrin and she is willing to return for an appointment.

## 2024-04-03 NOTE — Assessment & Plan Note (Signed)
 Seen in UC for dental infection, treated with amoxicillin . [name] was only able to take four days of 7 day course due to development of a yeast infection. She has not yet established with an oral surgeon but is planning to call today due to several concerns. She would like to have all bottom teeth remove and concerns regarding upper partial dentures. Encouraged her to follow-up.

## 2024-04-03 NOTE — Assessment & Plan Note (Signed)
 BMI 37 with serious comorbidity of hypertension, stroke, and CAD. Decreased over the last several years but stable in the last year. Continue healthy activity and nutrition.

## 2024-04-03 NOTE — Assessment & Plan Note (Signed)
 Vitamin D  9 at last check, started on supplementation. Recheck today.

## 2024-04-03 NOTE — Assessment & Plan Note (Signed)
 Heart regular rate and rhythm on today's examination. Cardiac monitor ordered and returned, yet to be read. F/u with cardiology tomorrow, 5/14.

## 2024-04-03 NOTE — Assessment & Plan Note (Signed)
 Stable. PHQ9 of 1 during recent AWV. Not currently on therapy.

## 2024-04-04 ENCOUNTER — Encounter: Payer: Self-pay | Admitting: Internal Medicine

## 2024-04-04 ENCOUNTER — Ambulatory Visit: Payer: 59 | Attending: Internal Medicine | Admitting: Internal Medicine

## 2024-04-04 ENCOUNTER — Encounter (HOSPITAL_BASED_OUTPATIENT_CLINIC_OR_DEPARTMENT_OTHER): Payer: Self-pay

## 2024-04-04 ENCOUNTER — Telehealth: Payer: Self-pay | Admitting: Internal Medicine

## 2024-04-04 VITALS — BP 124/70 | HR 70 | Ht 68.0 in | Wt 277.8 lb

## 2024-04-04 DIAGNOSIS — I4719 Other supraventricular tachycardia: Secondary | ICD-10-CM | POA: Diagnosis not present

## 2024-04-04 DIAGNOSIS — I639 Cerebral infarction, unspecified: Secondary | ICD-10-CM

## 2024-04-04 DIAGNOSIS — Z951 Presence of aortocoronary bypass graft: Secondary | ICD-10-CM

## 2024-04-04 DIAGNOSIS — E785 Hyperlipidemia, unspecified: Secondary | ICD-10-CM

## 2024-04-04 LAB — LIPID PANEL
Chol/HDL Ratio: 2.6 ratio (ref 0.0–4.4)
Cholesterol, Total: 132 mg/dL (ref 100–199)
HDL: 50 mg/dL (ref >39)
LDL Chol Calc (NIH): 66 mg/dL (ref 0–99)
Triglycerides: 80 mg/dL (ref 0–149)
VLDL Cholesterol Cal: 16 mg/dL (ref 5–40)

## 2024-04-04 LAB — VITAMIN D 25 HYDROXY (VIT D DEFICIENCY, FRACTURES): Vit D, 25-Hydroxy: 28.9 ng/mL — ABNORMAL LOW (ref 30.0–100.0)

## 2024-04-04 NOTE — Telephone Encounter (Signed)
 Patient stopped by 04/04/2024 to drop off heart monitor.

## 2024-04-04 NOTE — Patient Instructions (Addendum)
 Medication Instructions:  NO CHANGES  *If you need a refill on your cardiac medications before your next appointment, please call your pharmacy*  Lab Work: Fasting LIPID PANEL and LPa in 6 months -- once week before next visit  Follow-Up: At [facility], you and your health needs are our priority.  As part of our continuing mission to provide you with exceptional heart care, our providers are all part of one team.  This team includes your primary Cardiologist (physician) and Advanced Practice Providers or APPs (Physician Assistants and Nurse Practitioners) who all work together to provide you with the care you need, when you need it.  Your next appointment:    6 months with PA or NP  We recommend signing up for the patient portal called "MyChart".  Sign up information is provided on this After Visit Summary.  MyChart is used to connect with patients for Virtual Visits (Telemedicine).  Patients are able to view lab/test results, encounter notes, upcoming appointments, etc.  Non-urgent messages can be sent to your provider as well.   To learn more about what you can do with MyChart, go to [email or web address].

## 2024-04-04 NOTE — Progress Notes (Signed)
 OFFICE NOTE  Chief Complaint:  Routine follow-up  Primary Care Physician: Bevelyn Bryant, MD  HPI:  [name] is a 68 year old African American obese female presented to 01/01/14 with symptoms of unstable angina. I had seen her once in 2012, but not since then.  She recently had been having chest pain and presented urgently when she became short of breath at home and had chest tightness, which started as right arm pain and right chest pain, then moved substernally. She ultimately underwent cardiac catheterization via right radial artery which demonstrated severe three-vessel CAD with graftable target vessels. Echocardiogram showed good LV function with some left ventricular hypertrophy from her hypertension and there does not appear to be significant valve disease. Carotid dopplers showed a 1-39% bilateral carotid artery stenosis. She underwent a CABG x 5 on 01/09/2014. Left internal mammary artery to LAD, saphenous vein graft to diagonal, sequential saphenous vein graft to OM1 and OM2, saphenous vein graft to posterior descending. She had multiple teeth extractions prior to surgery. She did well post op. She was anemic but did not need transfusion. At discharge she went to SNF for rehab-she did not stay the recommended time frame due to BR not being available when she needed it. She was not given discharge medications when she left SNF. She saw Gussie Legato, FNP, in followup for her hospitalization and was restarted on some of her medications.   [name] was seen today in the office for follow-up. At her last office visit I cleared her for surgery and she underwent hysterectomy and has had resolution of her bleeding problems. She is now contemplating right knee surgery and also breast reduction surgery. She's in the interim seen in the hospital for palpitations. She reports those have improved with medication adjustment. She's decreased her Lasix  now to every other day and I think she can probably  take it as needed. She denies any chest pain worsening shortness of breath. She is undergoing injections to reduce the size of her keloid scar.  I saw [name] back in the office today. She's been undergoing rehabilitation and really wants to continue it. She's managed to lose some weight and feels better. Recently though she's been having increased blood pressures. Does have ranged up to 180 systolic. She had a small increase in her lisinopril  to 10 mg daily with an improvement however this staff does not want to continue her at rehabilitation until her blood pressure is better controlled. She denies any chest pain or palpitations.  [name] returns today for follow-up. She continues in cardiac rehabilitation and is doing well. At his last office visit I increased her lisinopril  to 20 mg daily and her blood pressure is now much better controlled. Overall she is feeling well. She is undergoing cesarean injections of the keloid scar that she has in her bypass site. She is concerned about a dilated varicose vein that she's developed at the inferior margin.  Since I last saw her, she is doing well. She underwent breast reductions surgery, which has helped her CABG scar some, but has failed to lose significant weight. BMI is now over 50. She does get short of breath but not worsening. Blood pressure is well-controlled and cough has gone away after switching from ACE-I to ARB. She reports slightly more palpitations recently. She has episodes of palpitations, worse at night that occur 3-4x a month.  07/01/2016  [name] returns today for follow-up. She reports some left leg swelling. She has a history  of left knee surgery and has had swelling since that time. She occasionally gets some swelling in the right leg which is where her saphenous vein grafts were taken. She still reports numbness and tingling around the vein harvest sites. She has had numbness and tingling of the chest which is exquisitely  tender to palpation although she has a "high pain tolerance". She recently underwent revision of the keloid scar to her median sternotomy. That does appear somewhat improved. She denies any worsening shortness of breath. Weight is stable.  03/21/2017  [name] was seen today in follow-up. She recently saw Odessa Bene, NP, for evaluation of chest pain. Given her history of coronary artery disease and prior CABG, he was concerned about recurrent ischemia. He ordered a Lexiscan  Myoview  which she underwent and it turns out was negative for ischemia. I do think a lot of her pain is related to her anterior chest keloid scar. She's also having problems with her knees which aren't about the importance of weight loss today. Recently she's lost a little bit away however she is still at a BMI around 50. Recently her blood pressure is running somewhat higher and her PCP increased her irbesartan  from 150 mg to 300 mg daily.  04/13/2018  [name] was seen today in routine follow-up.  Over the past year she is done well.  She denies any further chest pain.  She had had an episode not long after bypass and underwent repeat stress testing which was negative for ischemia.  She continues to have problems with a keloid scar over her median sternotomy.  She plans to see a dermatologist about this.  Blood pressure is been fairly well controlled.  Unfortunately her weight is not decreased significantly.  She has been having problems with arthritis and uses BC powder.  I recommended discontinuing that.  She is not taking daily aspirin  which she should be and will start that.  She also uses Aleve  and I recommended using that sparingly.  She should consider Tylenol  as an alternative.  She had an EKG today showed sinus bradycardia at 54 with very flat T waves and a questionable long QTC of over 650 ms.  On my evaluation, is very difficult to see where the T wave and.  I am not certain that there is actually QT prolongation, and  she is not actually on medications that would cause that.   04/07/2020  [name] returns today for follow-up.  Overall she seems to be doing well.  She has never arthritic issues and walks with a cane.  She is recently had some weight loss and intends to get down to 225 later this year.  She denies any chest pain or worsening shortness of breath.  Her blood pressure is elevated today however she saw her PCP this morning and it was much better controlled.  She says she switch to a plant-based diet.  04/02/2021  [name] returns today for follow-up.  She continues to do well.  She is lost some additional weight.  She is sticking to a plant-based diet.  Blood pressure appears to be well controlled.  She still using a cane and having issues with her back and hips.  She wants to be more active.  I encouraged her to consider utilizing her membership at the [facility] aquatic center.  She is due for repeat lipid as her last cholesterol test was in 2021.  She denies any chest pain with exertion.  09/23/2022  [name] returns  today for follow-up.  Over the last year she has done well.  More recently her cholesterol had gone up about 8 months ago with total 202 and LDL 133.  She says she was off of her diet and has made some changes.  She is recently lost some more weight and her lipids show total cholesterol 147, triglycerides 89 HDL 53 and LDL 77.  She denies any chest pain or shortness of breath.  She has persistent discomfort over the keloid area of scar which has been an ongoing issue.  Blood pressure was a little elevated today but she says is better controlled at home.  She is compliant with her medications.  She denies any shortness of breath.  She is able to lay flat.  She denies any apneic symptoms.  01/08/2024  [name] is seen today in follow-up.  Overall she seems to be doing well although blood pressure is still not well-controlled.  Today 147/78.  She was switched to amlodipine  by her PCP  however she says this is caused her issues with constipation.  She is ready on max dose of losartan .  She also has an upcoming surgery in April.  He had a Myoview  back in 2018 which showed a mildly reduced LVEF of 49% but no ischemia.  She also recently presented the emergency department in January.  She had an episode of tachycardia which was likely an SVT.  Fortunately it abated.  She was previously on a beta-blocker, however that had been discontinued due to bradycardia.  04/04/2024  [name] is seen today in follow-up.  Unfortunately since I last saw her she suffered a stroke.  She had left-sided facial droop and was found to have an internal capsule infarct.  Fortunately her symptoms improved.  She was started on Plavix  in addition to aspirin .  Her blood pressure medicines were adjusted and her beta-blocker was discontinued due to bradycardia.  She did wear her monitor but never returned it.  In fact she said today it is in her car.  She was advised to have to send that monitor back.  We will facilitate this so it could be processed.  She is on rosuvastatin  20 mg now.  She will need repeat lipids prior to her next follow-up visit.  Overall she seems to be doing well after the med adjustments.  She is having some periodontal issues and needs to see her dentist.  PMHx:  Past Medical History:  Diagnosis Date   Acute urinary retention s/p Foley 01/07/2012   Allergy    Anemia    Asthma    CAD (coronary artery disease)    a. 12/2013 s/p CABG x 5 Dr. Jeb Miner (LIMA to LAD, SVG to diagonal, SVG to OM1, SVG to OM2, SVG to PDA)   Carotid arterial disease (HCC)    a. 12/2013 Carotid U/S: 1-39% bilat ICA stenosis.   Carpal tunnel syndrome, bilateral    GERD (gastroesophageal reflux disease)    Hemorrhoids, internal, with bleeding & prolapse 12/13/2011   Hernia, abdominal    History of blood transfusion    CABG and hysterectomy   History of kidney stones    Hyperlipidemia    Hypertension    Iron   deficiency 12/07/2011   Keloid scar    a. Sternal Keloid s/p CABG with ongoing pain.   Morbid obesity (HCC)    Myocardial infarct (HCC)    2015   Neuropathy    Osteoarthritis    Osteoporosis    PONV (postoperative  nausea and vomiting)    Seasonal allergies    Sleep apnea    no CPAP machine; sleep study 02/2010 REM AHI 61.7/hr, total sleep REM 14.8/hr   Tooth abscess    Uterine cancer (HCC) 05/2014   clinical stage IA grade 1 endometrioid endometrial cancer    Past Surgical History:  Procedure Laterality Date   ARTERY BIOPSY Left 07/10/2021   Procedure: LEFT TEMPORAL ARTERY BIOPSY;  Surgeon: Margherita Shell, MD;  Location: [facility] OR;  Service: Vascular;  Laterality: Left;   bladder tack     BREAST REDUCTION SURGERY Bilateral 10/10/2015   Procedure: BILATERAL MAMMARY REDUCTION  (BREAST) WITH FREE NIPPLE GRAFT;  Surgeon: Alger Infield, MD;  Location: [facility] OR;  Service: Plastics;  Laterality: Bilateral;   CARDIAC CATHETERIZATION     CATARACT EXTRACTION Right    COLONOSCOPY  2009   COLONOSCOPY W/ BIOPSIES AND POLYPECTOMY     CORONARY ARTERY BYPASS GRAFT N/A 01/09/2014   Procedure: CORONARY ARTERY BYPASS GRAFTING (CABG) x 5 using left internal mammary artery and right leg greater saphenous vein harvested endoscopically;  Surgeon: Heriberto London, MD;  Location: [facility] OR;  Service: Open Heart Surgery;  Laterality: N/A;  please use bed extenders and breast binder   INTRAOPERATIVE TRANSESOPHAGEAL ECHOCARDIOGRAM N/A 01/09/2014   Procedure: INTRAOPERATIVE TRANSESOPHAGEAL ECHOCARDIOGRAM;  Surgeon: Heriberto London, MD;  Location: [facility] OR;  Service: Open Heart Surgery;  Laterality: N/A;   KNEE ARTHROSCOPY  1991   KNEE SURGERY Bilateral 1999   LEFT HEART CATHETERIZATION WITH CORONARY ANGIOGRAM N/A 01/04/2014   Procedure: LEFT HEART CATHETERIZATION WITH CORONARY ANGIOGRAM;  Surgeon: Mickiel Albany, MD;  Location: [facility] CATH LAB;  Service: Cardiovascular;  Laterality: N/A;   LESION EXCISION WITH COMPLEX  REPAIR N/A 05/07/2016   Procedure: COMPLEX REPAIR OF CHEST 16 CM;  Surgeon: Alger Infield, MD;  Location: [facility] OR;  Service: Plastics;  Laterality: N/A;   MULTIPLE EXTRACTIONS WITH ALVEOLOPLASTY N/A 04/11/2014   Procedure: Extraction of tooth #'s 1,2,8,16 with alveoloplasty, maxillary tuberosity reductions, and gross debridement of remaining teeth.;  Surgeon: Carol Chroman, DDS;  Location: [facility] OR;  Service: Oral Surgery;  Laterality: N/A;   NM MYOCAR PERF WALL MOTION  01/2010   dipyridamole myoview  - moderate perfusion defect in basal inferoseptal, basal inferior, mid inferoseptal, mid inferior, apical inferior region; EF 56%   TEE WITHOUT CARDIOVERSION  01/2010   EF 60-65%, small, flat, non-infiltrating, calcified, fixed apical/septal mass   TOTAL KNEE ARTHROPLASTY Left 04/29/2011   transanal hemorrhoidal dearterliaization  01/06/2012   with external hemorrhoid removal   UNILATERAL SALPINGECTOMY Right 06/03/2014   Procedure: UNILATERAL SALPINGECTOMY;  Surgeon: Lenord Radon, MD;  Location: [facility] ORS;  Service: Gynecology;  Laterality: Right;   VAGINAL HYSTERECTOMY N/A 06/03/2014   Procedure: HYSTERECTOMY VAGINAL;  Surgeon: Lenord Radon, MD;  Location: [facility] ORS;  Service: Gynecology;  Laterality: N/A;   WISDOM TOOTH EXTRACTION      FAMHx:  Family History  Problem Relation Age of Onset   Heart attack Mother 40   Heart disease Mother    Hypertension Mother    Diabetes Mother    Hypertension Sister    Heart failure Sister    Heart disease Sister    Diabetes Sister    Kidney disease Sister    Kidney disease Brother    Hypertension Daughter    Bipolar disorder Son    Schizophrenia Son    Anxiety disorder Son    Esophageal cancer Other  nephew   Colon cancer Neg Hx    Pancreatic cancer Neg Hx    Stomach cancer Neg Hx    Liver disease Neg Hx    Rectal cancer Neg Hx     SOCHx:   reports that she has never smoked. She has never been exposed to tobacco  smoke. She has never used smokeless tobacco. She reports that she does not drink alcohol  and does not use drugs.  ALLERGIES:  Allergies  Allergen Reactions   Cephalosporins Anaphylaxis, Swelling and Other (See Comments)    Tongue swelling Gumpain   Eicosapentaenoic Acid (Epa) (Fish) Anaphylaxis and Shortness Of Breath   Fish-Derived Products Anaphylaxis and Shortness Of Breath   Flagyl  [Metronidazole ] Shortness Of Breath, Palpitations, Other (See Comments) and Hypertension    Metallic taste Dry Mouth   Peanuts [Peanut Oil] Anaphylaxis, Shortness Of Breath and Other (See Comments)    Peanut butter   Shrimp [Shellfish Allergy] Anaphylaxis and Swelling    Throat swelling   Latex Itching   Cipro  [Ciprofloxacin  Hcl] Other (See Comments)    Gagging  Muscle/body aches   Diovan  Hct [Valsartan -Hydrochlorothiazide ] Nausea Only   Methotrexate  Derivatives Nausea Only   Aleve  [Naproxen ] Nausea Only and Other (See Comments)    Headache   Other Itching and Other (See Comments)     All pain meds make her itch - has to have something to prevent that in addition to receiving med   Prednisone  Palpitations and Other (See Comments)    Tachycardia OK with lower dosages     Zestril  [Lisinopril ] Cough         ROS: Pertinent items noted in HPI and remainder of comprehensive ROS otherwise negative.  HOME MEDS: Current Outpatient Medications  Medication Sig Dispense Refill   amLODipine  (NORVASC ) 5 MG tablet Take 1 tablet (5 mg total) by mouth daily. 30 tablet 3   aspirin  EC 81 MG tablet Take 1 tablet (81 mg total) by mouth daily. Swallow whole. 30 tablet 2   chlorhexidine  (PERIDEX ) 0.12 % solution Use as directed 15 mLs in the mouth or throat 2 (two) times daily. 120 mL 0   cholecalciferol (VITAMIN D3) 25 MCG (1000 UNIT) tablet Take 1 tablet (1,000 Units total) by mouth daily. 90 tablet 3   clopidogrel  (PLAVIX ) 75 MG tablet Take 1 tablet (75 mg total) by mouth daily. 90 tablet 3   magic  mouthwash (nystatin , lidocaine , diphenhydrAMINE ) suspension Take 5 mLs by mouth 3 (three) times daily as needed for mouth pain. 180 mL 0   omeprazole  (PRILOSEC) 20 MG capsule Take 1 capsule (20 mg total) by mouth daily before breakfast. Please schedule a yearly follow up for further refills. Thank you 30 capsule 1   rosuvastatin  (CRESTOR ) 20 MG tablet Take 20 mg by mouth daily.     Vibegron  75 MG TABS Take 1 tablet (75 mg total) by mouth daily. (Patient taking differently: Take 75 mg by mouth See admin instructions. Take 1 tablet (75mg ) 4 days a week on Monday, Wednesday, Friday, Saturday.) 30 tablet 5   albuterol  (VENTOLIN  HFA) 108 (90 Base) MCG/ACT inhaler Inhale 2 puffs into the lungs every 6 (six) hours as needed for wheezing or shortness of breath. (Patient not taking: Reported on 04/04/2024) 1 each 0   No current facility-administered medications for this visit.    LABS/IMAGING: Results for orders placed or performed in visit on 04/03/24 (from the past 48 hours)  Lipid Profile     Status: None   Collection Time: 04/03/24  10:32 AM  Result Value Ref Range   Cholesterol, Total 132 100 - 199 mg/dL   Triglycerides 80 0 - 149 mg/dL   HDL 50 >16 mg/dL   VLDL Cholesterol Cal 16 5 - 40 mg/dL   LDL Chol Calc (NIH) 66 0 - 99 mg/dL   Chol/HDL Ratio 2.6 0.0 - 4.4 ratio    Comment:                                   T. Chol/HDL Ratio                                             Men  Women                               1/2 Avg.Risk  3.4    3.3                                   Avg.Risk  5.0    4.4                                2X Avg.Risk  9.6    7.1                                3X Avg.Risk 23.4   11.0   VITAMIN D  25 Hydroxy (Vit-D Deficiency, Fractures)     Status: Abnormal   Collection Time: 04/03/24 10:32 AM  Result Value Ref Range   Vit D, 25-Hydroxy 28.9 (L) 30.0 - 100.0 ng/mL    Comment: Vitamin D  deficiency has been defined by the Institute of Medicine and an Endocrine Society practice  guideline as a level of serum 25-OH vitamin D  less than 20 ng/mL (1,2). The Endocrine Society went on to further define vitamin D  insufficiency as a level between 21 and 29 ng/mL (2). 1. IOM (Institute of Medicine). 2010. Dietary reference    intakes for calcium  and D. [place]: The    [employer]. 2. Holick MF, Binkley Ord, Bischoff-Ferrari HA, et al.    Evaluation, treatment, and prevention of vitamin D     deficiency: an Endocrine Society clinical practice    guideline. JCEM. 2011 Jul; 96(7):1911-30.    *Note: Due to a large number of results and/or encounters for the requested time period, some results have not been displayed. A complete set of results can be found in Results Review.   No results found.  No results found for: "LIPOA"    VITALS: BP 124/70 (BP Location: Left Arm, Patient Position: Sitting)   Pulse 70   Ht 5\' 8"  (1.727 m)   Wt 277 lb 12.8 oz (126 kg)   LMP 01/13/2014 Comment: spotting in Feb and bleeding since April  SpO2 99%   BMI 42.24 kg/m   EXAM: General appearance: alert and no distress Neck: no carotid bruit and no JVD Lungs: clear to auscultation bilaterally Heart: regular rate and rhythm, S1, S2 normal, no murmur, click, rub or gallop Abdomen: soft, non-tender; bowel sounds normal; no masses,  no organomegaly and morbidly obese Extremities: extremities normal, atraumatic, no cyanosis or edema Pulses: 2+ and symmetric Skin: Keloid scar of the midsternal incision site with a dilated varicose vein at the distal aspect Neurologic: Grossly normal  EKG: Deferred  ASSESSMENT: Acute ischemic stroke PSVT Coronary artery disease status post 5 vessel CABG (2015) -low risk Myoview  02/2017 Morbid obesity Hypertension Dyslipidemia OSA, not on cPAP Keloid scar - s/p breast reduction, possible upcoming surgery for scar revision Palpitations Leg edema ?  Prolonged QTC-review suggests this is likely not prolonged due to flattening of the T  wave  PLAN: 1.   [name] unfortunately recently suffered an acute stroke however the etiology is unclear.  She has a history of PSVT could is he will be having some A-fib but this is not yet been documented.  She did wear a monitor but has not returned that yet.  Will facilitate that return today since she has it and I will contact her with those results.  Her medications were adjusted and now she is on dual antiplatelet therapy.  She is also on high-dose rosuvastatin .  Will plan repeat lipids including an LP(a) prior to follow-up in 6 months.  Hazle Lites, MD, [facility], FNLA, FACP  [place]  [facility]  Medical Director of the [facility] Lipid Disorders &  Cardiovascular Risk Reduction Clinic Diplomate of the American Board of Clinical Lipidology Attending Cardiologist  Direct Dial: [phone]  Fax: [phone]  Website:  [email or web address] 04/04/2024, 9:37 AM

## 2024-04-05 ENCOUNTER — Ambulatory Visit: Payer: Self-pay | Admitting: Internal Medicine

## 2024-04-05 NOTE — Progress Notes (Signed)
 Improved vitamin D  s/p high-dose supplementation. Continue 1000 international units daily. Discussed with Ms. Orrick 5/15.

## 2024-04-05 NOTE — Progress Notes (Signed)
 Lipids improved with LDL <70 on high-dose rosuvastatin . Discussed the importance of continuing this medication for CV risk reduction and [name] agrees.

## 2024-04-09 ENCOUNTER — Encounter: Payer: 59 | Admitting: Obstetrics and Gynecology

## 2024-04-10 ENCOUNTER — Encounter (HOSPITAL_COMMUNITY): Payer: Self-pay

## 2024-04-10 ENCOUNTER — Encounter: Payer: 59 | Admitting: Obstetrics and Gynecology

## 2024-04-10 ENCOUNTER — Ambulatory Visit (HOSPITAL_COMMUNITY)
Admission: EM | Admit: 2024-04-10 | Discharge: 2024-04-10 | Disposition: A | Discharge status: Home / Self Care | Attending: Nurse Practitioner | Admitting: Nurse Practitioner

## 2024-04-10 ENCOUNTER — Ambulatory Visit: Payer: Self-pay

## 2024-04-10 DIAGNOSIS — M542 Cervicalgia: Secondary | ICD-10-CM

## 2024-04-10 DIAGNOSIS — K029 Dental caries, unspecified: Secondary | ICD-10-CM | POA: Diagnosis not present

## 2024-04-10 MED ORDER — CHLORHEXIDINE GLUCONATE 0.12 % MT SOLN: 15.0000 mL | Freq: Two times a day (BID) | OROMUCOSAL | 0 refills | Status: AC

## 2024-04-10 MED ORDER — KETOROLAC TROMETHAMINE 60 MG/2ML IM SOLN
60.0000 mg | Freq: Once | INTRAMUSCULAR | Status: AC
  Administered 2024-04-10: 60 mg via INTRAMUSCULAR

## 2024-04-10 MED ORDER — DEXAMETHASONE SODIUM PHOSPHATE 10 MG/ML IJ SOLN
10.0000 mg | Freq: Once | INTRAMUSCULAR | Status: AC
  Administered 2024-04-10: 10 mg via INTRAMUSCULAR

## 2024-04-10 MED ORDER — AMOXICILLIN 875 MG PO TABS: 875.0000 mg | ORAL_TABLET | Freq: Two times a day (BID) | ORAL | 0 refills | Status: AC

## 2024-04-10 MED ORDER — KETOROLAC TROMETHAMINE 60 MG/2ML IM SOLN
INTRAMUSCULAR | Status: AC
  Filled 2024-04-10: qty 2

## 2024-04-10 MED ORDER — DEXAMETHASONE SODIUM PHOSPHATE 10 MG/ML IJ SOLN
INTRAMUSCULAR | Status: AC
  Filled 2024-04-10: qty 1

## 2024-04-10 NOTE — ED Notes (Signed)
 Reviewed instructions with patient.  Patient expected home medications,  notified Maryruth Sol, NP.  Scripts will be sent in to pharmacy now

## 2024-04-10 NOTE — ED Triage Notes (Signed)
 Patient here today with c/o right side neck pain and stiffness that started this morning. Patient states that she has since started having a throbbing headache and having some nausea.   Patient is also c/o mouth pain X since last month. Patient was seen 3 weeks ago for this. Patient states that she had to stop taking the medication on the 4 day due to itching. The magic mouthwash helps.

## 2024-04-10 NOTE — Discharge Instructions (Addendum)
 You were seen today for severe neck pain that started suddenly this morning and has gotten worse throughout the day. The pain is on the right side of your neck and radiates to your upper back. This type of pain is often due to muscle strain, inflammation, or irritation of the joints or soft tissues in the neck.  You received a steroid and anti-inflammatory injection in the clinic to help reduce pain and inflammation. If needed, you may take Tylenol  (acetaminophen ) 1000 mg every six hours for additional pain relief. This equals two 500 mg tablets at a time. Be careful not to take more than 4000 mg of Tylenol  in a 24-hour period.  Continue ice for the next 24 to 48 hours which can help reduce inflammation, followed by heat to relax tight muscles. Try to rest your neck, avoid sudden movements, and gently stretch or perform neck exercises as tolerated to keep the muscles from stiffening.  If the pain does not improve or worsens, you should follow up with an orthopedic specialist.   Go to the emergency room if you experience weakness, numbness, difficulty walking, or loss of bowel or bladder control. Let us  know if you have any questions or concerns.  You were also seen today for dental pain. It is very important that you see a dentist as soon as possible to fully treat the problem. I have given you the names of two clinics that offer free dental services--please make an appointment with whichever one can see you the soonest. Take the antibiotics exactly as prescribed to help prevent or treat any infection. Use the prescribed mouth rinse twice a day after brushing your teeth to help keep the area clean. After eating, rinse your mouth with water  to remove any food particles and reduce irritation. Avoid any foods or drinks that make the pain worse. Stick to soft foods while your mouth is healing, and do not use straws, as this can delay healing.

## 2024-04-10 NOTE — Telephone Encounter (Signed)
 Chart review shows 7 days of Augmentin  prescribed 03/22/24. Will need to see patient in clinic to assess need for further antibiotics. Appointment with Dr. Meredeth Stallion 5/22.

## 2024-04-10 NOTE — ED Provider Notes (Addendum)
 [facility]    CSN: [id number] Arrival date & time: 04/10/24  1632      History   Chief Complaint Chief Complaint  Patient presents with   Torticollis   Dental Pain    HPI [name] is a 68 y.o. female.   [name] is a 68 y.o. female that presents with acute neck pain that started this morning and has progressively worsened throughout the day. She also reports ongoing dental pain and headache. The patient states that the neck pain began this morning and has intensified to the point where she "can't move anymore" without experiencing severe pain. The pain is localized to the right side of her neck and radiates down into her upper back. She describes the pain as throbbing and reports that certain movements cause her to "start screaming." The patient denies any recent falls or injuries and states she has never experienced pain like this before. She took an Aleve  early in the morning for pain relief, but it was ineffective. It is noted that she is allergic to Aleve , but she explains that her eye doctor recommends it for eye pain, and she took it because it was the only medication available at home.  In addition to the neck pain, patient reports ongoing dental pain affecting her entire mouth, particularly on the right side, involving both upper and lower teeth and gums. This dental pain has been present for about a month. She was seen at this clinic approximately 20 days ago for the same issue. The patient mentions she has a dentist appointment scheduled for August but has been unable to get an earlier appointment. She only took four days of the prescribed Augmentin  due to intolerance of the medication.  The following portions of the patient's history were reviewed and updated as appropriate: allergies, current medications, past family history, past medical history, past social history, past surgical history, and problem list.    Seen on 5/1 for dental pain. Given Rx  augmentin . Patient only took for 4 days. Medicine caused itching. Has dentist appointment but not until August.   Past Medical History:  Diagnosis Date   Acute urinary retention s/p Foley 01/07/2012   Allergy    Anemia    Asthma    CAD (coronary artery disease)    a. 12/2013 s/p CABG x 5 Dr. Jeb Miner (LIMA to LAD, SVG to diagonal, SVG to OM1, SVG to OM2, SVG to PDA)   Carotid arterial disease (HCC)    a. 12/2013 Carotid U/S: 1-39% bilat ICA stenosis.   Carpal tunnel syndrome, bilateral    GERD (gastroesophageal reflux disease)    Hemorrhoids, internal, with bleeding & prolapse 12/13/2011   Hernia, abdominal    History of blood transfusion    CABG and hysterectomy   History of kidney stones    Hyperlipidemia    Hypertension    Iron  deficiency 12/07/2011   Keloid scar    a. Sternal Keloid s/p CABG with ongoing pain.   Morbid obesity (HCC)    Myocardial infarct (HCC)    2015   Neuropathy    Osteoarthritis    Osteoporosis    PONV (postoperative nausea and vomiting)    Seasonal allergies    Sleep apnea    no CPAP machine; sleep study 02/2010 REM AHI 61.7/hr, total sleep REM 14.8/hr   Tooth abscess    Uterine cancer (HCC) 05/2014   clinical stage IA grade 1 endometrioid endometrial cancer    Patient Active Problem List  Diagnosis Date Noted   History of stroke 01/21/2024   Other supraventricular tachycardia (HCC) 12/16/2023   Hearing loss of both ears due to cerumen impaction [H61.23] 05/27/2023   Polyarthritis with positive rheumatoid factor (HCC) 03/31/2022   High risk medication use 03/31/2022   Tendinitis of both rotator cuffs 09/07/2021   Keloid scar of skin 09/07/2021   Pes planus of left foot 09/08/2020   Hallux valgus 09/08/2020   Glucose intolerance (impaired glucose tolerance) 07/14/2020   Vitamin D  deficiency 07/14/2020   Depression 10/16/2018   Osteoarthritis of left shoulder 10/24/2017   Peripheral neuropathy 08/19/2017   Atherosclerosis of aorta (HCC)  06/20/2015   Osteoarthritis of right knee 11/07/2014   Health care maintenance 10/09/2014   Coronary atherosclerosis of native coronary artery 01/05/2014   Morbid obesity due to excess calories (HCC) 05/10/2012   Obstructive sleep apnea 03/27/2010   Poor dentition 01/21/2009   GERD 08/06/2008   Hyperlipidemia 10/31/2007   Essential hypertension 06/30/2007    Past Surgical History:  Procedure Laterality Date   ARTERY BIOPSY Left 07/10/2021   Procedure: LEFT TEMPORAL ARTERY BIOPSY;  Surgeon: Margherita Shell, MD;  Location: [facility] OR;  Service: Vascular;  Laterality: Left;   bladder tack     BREAST REDUCTION SURGERY Bilateral 10/10/2015   Procedure: BILATERAL MAMMARY REDUCTION  (BREAST) WITH FREE NIPPLE GRAFT;  Surgeon: Alger Infield, MD;  Location: [facility] OR;  Service: Plastics;  Laterality: Bilateral;   CARDIAC CATHETERIZATION     CATARACT EXTRACTION Right    COLONOSCOPY  2009   COLONOSCOPY W/ BIOPSIES AND POLYPECTOMY     CORONARY ARTERY BYPASS GRAFT N/A 01/09/2014   Procedure: CORONARY ARTERY BYPASS GRAFTING (CABG) x 5 using left internal mammary artery and right leg greater saphenous vein harvested endoscopically;  Surgeon: Heriberto London, MD;  Location[facility] OR;  Service: Open Heart Surgery;  Laterality: N/A;  please use bed extenders and breast binder   INTRAOPERATIVE TRANSESOPHAGEAL ECHOCARDIOGRAM N/A 01/09/2014   Procedure: INTRAOPERATIVE TRANSESOPHAGEAL ECHOCARDIOGRAM;  Surgeon: Heriberto London, MD;  Location: [facility] OR;  Service: Open Heart Surgery;  Laterality: N/A;   KNEE ARTHROSCOPY  1991   KNEE SURGERY Bilateral 1999   LEFT HEART CATHETERIZATION WITH CORONARY ANGIOGRAM N/A 01/04/2014   Procedure: LEFT HEART CATHETERIZATION WITH CORONARY ANGIOGRAM;  Surgeon: Mickiel Albany, MD;  Location: [facility] CATH LAB;  Service: Cardiovascular;  Laterality: N/A;   LESION EXCISION WITH COMPLEX REPAIR N/A 05/07/2016   Procedure: COMPLEX REPAIR OF CHEST 16 CM;  Surgeon: Alger Infield, MD;   Location: [facility] OR;  Service: Plastics;  Laterality: N/A;   MULTIPLE EXTRACTIONS WITH ALVEOLOPLASTY N/A 04/11/2014   Procedure: Extraction of tooth #'s 1,2,8,16 with alveoloplasty, maxillary tuberosity reductions, and gross debridement of remaining teeth.;  Surgeon: Carol Chroman, DDS;  Location: [facility] OR;  Service: Oral Surgery;  Laterality: N/A;   NM MYOCAR PERF WALL MOTION  01/2010   dipyridamole myoview  - moderate perfusion defect in basal inferoseptal, basal inferior, mid inferoseptal, mid inferior, apical inferior region; EF 56%   TEE WITHOUT CARDIOVERSION  01/2010   EF 60-65%, small, flat, non-infiltrating, calcified, fixed apical/septal mass   TOTAL KNEE ARTHROPLASTY Left 04/29/2011   transanal hemorrhoidal dearterliaization  01/06/2012   with external hemorrhoid removal   UNILATERAL SALPINGECTOMY Right 06/03/2014   Procedure: UNILATERAL SALPINGECTOMY;  Surgeon: Lenord Radon, MD;  Location: [facility] ORS;  Service: Gynecology;  Laterality: Right;   VAGINAL HYSTERECTOMY N/A 06/03/2014   Procedure: HYSTERECTOMY VAGINAL;  Surgeon: Lenord Radon, MD;  Location:  [facility] ORS;  Service: Gynecology;  Laterality: N/A;   WISDOM TOOTH EXTRACTION      OB History     Gravida  2   Para  2   Term  2   Preterm  0   AB  0   Living  2      SAB  0   IAB  0   Ectopic  0   Multiple  0   Live Births  2            Home Medications    Prior to Admission medications   Medication Sig Start Date End Date Taking? Authorizing Provider  amoxicillin  (AMOXIL ) 875 MG tablet Take 1 tablet (875 mg total) by mouth 2 (two) times daily for 7 days. 04/10/24 04/17/24 Yes Maryruth Sol, FNP  chlorhexidine  (PERIDEX ) 0.12 % solution Use as directed 15 mLs in the mouth or throat 2 (two) times daily. Rinse your mouth twice daily after brushing teeth. Swish the solution in mouth thoroughly for 30 seconds, then spit it out. Do not swallow the rinse.  Do not eat, drink, smoke or rinse your mouth  with water  immediately after using this. 04/10/24  Yes Maryruth Sol, FNP  albuterol  (VENTOLIN  HFA) 108 (90 Base) MCG/ACT inhaler Inhale 2 puffs into the lungs every 6 (six) hours as needed for wheezing or shortness of breath. Patient not taking: Reported on 04/04/2024 04/03/24   Bevelyn Bryant, MD  amLODipine  (NORVASC ) 5 MG tablet Take 1 tablet (5 mg total) by mouth daily. 01/23/24 01/22/25  Masters, Katie, DO  aspirin  EC 81 MG tablet Take 1 tablet (81 mg total) by mouth daily. Swallow whole. 01/24/24 04/22/24  Masters, Katie, DO  cholecalciferol (VITAMIN D3) 25 MCG (1000 UNIT) tablet Take 1 tablet (1,000 Units total) by mouth daily. 02/08/24   Bevelyn Bryant, MD  clopidogrel  (PLAVIX ) 75 MG tablet Take 1 tablet (75 mg total) by mouth daily. 01/23/24   Masters, Alston Jerry, DO  magic mouthwash (nystatin , lidocaine , diphenhydrAMINE ) suspension Take 5 mLs by mouth 3 (three) times daily as needed for mouth pain. 03/22/24   Levora Reas A, NP  omeprazole  (PRILOSEC) 20 MG capsule Take 1 capsule (20 mg total) by mouth daily before breakfast. Please schedule a yearly follow up for further refills. Thank you 03/23/24   Armbruster, Lendon Queen, MD  rosuvastatin  (CRESTOR ) 20 MG tablet Take 20 mg by mouth daily.    [provider]  Vibegron  75 MG TABS Take 1 tablet (75 mg total) by mouth daily. Patient taking differently: Take 75 mg by mouth See admin instructions. Take 1 tablet (75mg ) 4 days a week on Monday, Wednesday, Friday, Saturday. 10/31/23   Zuleta, Kaitlin G, NP    Family History Family History  Problem Relation Age of Onset   Heart attack Mother 67   Heart disease Mother    Hypertension Mother    Diabetes Mother    Hypertension Sister    Heart failure Sister    Heart disease Sister    Diabetes Sister    Kidney disease Sister    Kidney disease Brother    Hypertension Daughter    Bipolar disorder Son    Schizophrenia Son    Anxiety disorder Son    Esophageal cancer Other        nephew   Colon cancer Neg  Hx    Pancreatic cancer Neg Hx    Stomach cancer Neg Hx    Liver disease Neg Hx    Rectal cancer Neg  Hx     Social History Social History   Tobacco Use   Smoking status: Never    Passive exposure: Never   Smokeless tobacco: Never  Vaping Use   Vaping status: Never Used  Substance Use Topics   Alcohol  use: No    Alcohol /week: 0.0 standard drinks of alcohol    Drug use: No     Allergies   Cephalosporins, Eicosapentaenoic acid (epa) (fish), Fish-derived products, Flagyl  [metronidazole ], Peanuts [peanut oil], Shrimp [shellfish allergy], Latex, Cipro  [ciprofloxacin  hcl], Diovan  hct [valsartan -hydrochlorothiazide ], Methotrexate  derivatives, Aleve  [naproxen ], Other, Prednisone , and Zestril  [lisinopril ]   Review of Systems Review of Systems  HENT:  Positive for dental problem. Negative for sore throat and trouble swallowing.   Gastrointestinal:  Positive for nausea. Negative for vomiting.  Musculoskeletal:  Positive for back pain, neck pain and neck stiffness.  Neurological:  Positive for headaches.  All other systems reviewed and are negative.    Physical Exam Triage Vital Signs ED Triage Vitals  Encounter Vitals Group     BP 04/10/24 1727 (!) 157/77     Systolic BP Percentile --      Diastolic BP Percentile --      Pulse Rate 04/10/24 1727 78     Resp 04/10/24 1727 16     Temp 04/10/24 1727 98.6 F (37 C)     Temp Source 04/10/24 1727 Oral     SpO2 04/10/24 1727 100 %     Weight --      Height --      Head Circumference --      Peak Flow --      Pain Score 04/10/24 1733 10     Pain Loc --      Pain Education --      Exclude from Growth Chart --    No data found.  Updated Vital Signs BP (!) 157/77 (BP Location: Left Arm)   Pulse 78   Temp 98.6 F (37 C) (Oral)   Resp 16   LMP 01/13/2014 Comment: spotting in Feb and bleeding since April  SpO2 100%   Visual Acuity Right Eye Distance:   Left Eye Distance:   Bilateral Distance:    Right Eye Near:   Left  Eye Near:    Bilateral Near:     Physical Exam Vitals reviewed.  Constitutional:      General: She is awake. She is not in acute distress.    Appearance: Normal appearance. She is well-developed. She is not ill-appearing or toxic-appearing.  HENT:     Head: Normocephalic.     Mouth/Throat:     Mouth: Mucous membranes are moist.     Dentition: Dental caries (throughout the entire mouth) present. No gingival swelling, dental abscesses or gum lesions.     Pharynx: Oropharynx is clear. Uvula midline.  Eyes:     Conjunctiva/sclera: Conjunctivae normal.  Neck:     Comments: Decreased active range of motion noted; however, passive range of motion is significantly improved and without evidence of rigidity.  Cardiovascular:     Rate and Rhythm: Normal rate and regular rhythm.     Heart sounds: Normal heart sounds.  Pulmonary:     Effort: Pulmonary effort is normal.     Breath sounds: Normal breath sounds.  Musculoskeletal:     Cervical back: No erythema, rigidity, torticollis or crepitus. Pain with movement and muscular tenderness present. Decreased range of motion.  Lymphadenopathy:     Cervical: No cervical adenopathy.  Skin:  General: Skin is warm and dry.  Neurological:     General: No focal deficit present.     Mental Status: She is alert and oriented to person, place, and time.  Psychiatric:        Behavior: Behavior is cooperative.      UC Treatments / Results  Labs (all labs ordered are listed, but only abnormal results are displayed) Labs Reviewed - No data to display  EKG   Radiology No results found.  Procedures Procedures (including critical care time)  Medications Ordered in UC Medications  dexamethasone  (DECADRON ) injection 10 mg (10 mg Intramuscular Given 04/10/24 2007)  ketorolac  (TORADOL ) injection 60 mg (60 mg Intramuscular Given 04/10/24 2009)    Initial Impression / Assessment and Plan / UC Course  I have reviewed the triage vital signs and the  nursing notes.  Pertinent labs & imaging results that were available during my care of the patient were reviewed by me and considered in my medical decision making (see chart for details).    68 year old female presenting with acute onset of severe neck and upper back pain that began this morning and has progressively worsened throughout the day. Pain is localized to the right side of the neck and radiates to the upper back. She describes the pain as throbbing and severe, with certain movements causing intense discomfort. She denies any recent trauma, falls, or prior similar episodes. Despite a documented allergy, she took naproxen  earlier today without relief. In clinic, she was treated with an intramuscular steroid injection and a Toradol  injection. Ice and heat therapy were recommended along with neck mobility exercises. Extra-strength Tylenol  advised for additional pain control. Orthopedic follow-up was advised if symptoms persist.   She also reports ongoing dental pain. She was seen for this over two weeks ago and was prescribed Augmentin , which she discontinued after four days due to intolerance. Amoxicillin  and Peridex  mouth rinse were prescribed today. She was provided with information for dental clinics that may offer an earlier appointment, though she is currently scheduled to follow up with dentistry in August.  Today's evaluation has revealed no signs of a dangerous process. Discussed diagnosis with patient and/or guardian. Patient and/or guardian aware of their diagnosis, possible red flag symptoms to watch out for and need for close follow up. Patient and/or guardian understands verbal and written discharge instructions. Patient and/or guardian comfortable with plan and disposition.  Patient and/or guardian has a clear mental status at this time, good insight into illness (after discussion and teaching) and has clear judgment to make decisions regarding their care  Documentation was  completed with the aid of voice recognition software. Transcription may contain typographical errors. Final Clinical Impressions(s) / UC Diagnoses   Final diagnoses:  Pain due to dental caries  Neck pain on right side     Discharge Instructions      You were seen today for severe neck pain that started suddenly this morning and has gotten worse throughout the day. The pain is on the right side of your neck and radiates to your upper back. This type of pain is often due to muscle strain, inflammation, or irritation of the joints or soft tissues in the neck.  You received a steroid and anti-inflammatory injection in the clinic to help reduce pain and inflammation. If needed, you may take Tylenol  (acetaminophen ) 1000 mg every six hours for additional pain relief. This equals two 500 mg tablets at a time. Be careful not to take more than 4000 mg  of Tylenol  in a 24-hour period.  Continue ice for the next 24 to 48 hours which can help reduce inflammation, followed by heat to relax tight muscles. Try to rest your neck, avoid sudden movements, and gently stretch or perform neck exercises as tolerated to keep the muscles from stiffening.  If the pain does not improve or worsens, you should follow up with an orthopedic specialist.   Go to the emergency room if you experience weakness, numbness, difficulty walking, or loss of bowel or bladder control. Let us  know if you have any questions or concerns.  You were also seen today for dental pain. It is very important that you see a dentist as soon as possible to fully treat the problem. I have given you the names of two clinics that offer free dental services--please make an appointment with whichever one can see you the soonest. Take the antibiotics exactly as prescribed to help prevent or treat any infection. Use the prescribed mouth rinse twice a day after brushing your teeth to help keep the area clean. After eating, rinse your mouth with water  to remove  any food particles and reduce irritation. Avoid any foods or drinks that make the pain worse. Stick to soft foods while your mouth is healing, and do not use straws, as this can delay healing.     ED Prescriptions     Medication Sig Dispense Auth. Provider   amoxicillin  (AMOXIL ) 875 MG tablet Take 1 tablet (875 mg total) by mouth 2 (two) times daily for 7 days. 14 tablet Karol Skarzynski, Short, FNP   chlorhexidine  (PERIDEX ) 0.12 % solution Use as directed 15 mLs in the mouth or throat 2 (two) times daily. Rinse your mouth twice daily after brushing teeth. Swish the solution in mouth thoroughly for 30 seconds, then spit it out. Do not swallow the rinse.  Do not eat, drink, smoke or rinse your mouth with water  immediately after using this. 210 mL Maryruth Sol, FNP      PDMP not reviewed this encounter.   Maryruth Sol, FNP 04/10/24 2012    Maryruth Sol, [place] 04/10/24 2031

## 2024-04-10 NOTE — ED Notes (Signed)
 Ice pack provided to patient.

## 2024-04-10 NOTE — Telephone Encounter (Signed)
  Chief Complaint: gum pain Symptoms: swollen and painful gums Frequency: recently about thow months Pertinent Negatives: Patient denies difficulty eating/drinking Disposition: [] ED /[] Urgent Care (no appt availability in office) / [] Appointment(In office/virtual)/ []  [facility]/ [] Home Care/ [] Refused Recommended Disposition /[] [facility]/ [x]  Follow-up with PCP Additional Notes: took amoxicillin  for four days, had improvement, but experienced itching and stopped abx.  Now pain has flared up again.  She would like an antibiotic.  Dentist appointment scheduled for August 2025 Appointment scheduled, will proceed to UC if pain increases Copied from CRM [id number]. Topic: Clinical - Red Word Triage >> Apr 10, 2024 10:02 AM Corin V wrote: [facility] that prompted transfer to Nurse Triage: She went to the urgent care 2-3 weeks ago and they gave her amoxicillin -clavulanate (AUGMENTIN ) 875-125 MG tablet and she can't take it since the clavulanate portion makes her itch very badly. She said her gums are swollen and receding over her teeth. She wants to know if a regular amoxicillin  can be prescribed for her instead. She took the amoxicillin -clavulanate (AUGMENTIN ) 875-125 MG tablet for 7 days but had to stop on day 8 due to how severe the itching was. Reason for Disposition  [1] MILD-MODERATE mouth pain AND [2] present > 3 days  Answer Assessment - Initial Assessment Questions 1. ONSET: "When did the mouth start hurting?" (e.g., hours or days ago)      Gums- off and on for a couple of months 2. SEVERITY: "How bad is the pain?" (Scale 1-10; mild, moderate or severe)   - MILD (1-3):  doesn't interfere with eating or normal activities   - MODERATE (4-7): interferes with eating some solids and normal activities   - SEVERE (8-10):  excruciating pain, interferes with most normal activities   - SEVERE DYSPHAGIA: can't swallow liquids, drooling     moderate 3. SORES: "Are there any sores  or ulcers in the mouth?" If Yes, ask: "What part of the mouth are the sores in?"     no 4. FEVER: "Do you have a fever?" If Yes, ask: "What is your temperature, how was it measured, and when did it start?"     no 5. CAUSE: "What do you think is causing the mouth pain?"     Peridontal disease 6. OTHER SYMPTOMS: "Do you have any other symptoms?" (e.g., difficulty breathing)     no  Protocols used: Mouth Pain-A-AH

## 2024-04-11 DIAGNOSIS — I639 Cerebral infarction, unspecified: Secondary | ICD-10-CM | POA: Diagnosis not present

## 2024-04-11 DIAGNOSIS — R001 Bradycardia, unspecified: Secondary | ICD-10-CM | POA: Diagnosis not present

## 2024-04-12 ENCOUNTER — Ambulatory Visit (INDEPENDENT_AMBULATORY_CARE_PROVIDER_SITE_OTHER): Admitting: Internal Medicine

## 2024-04-12 VITALS — BP 160/78 | HR 67 | Temp 98.2°F | Ht 68.0 in | Wt 281.4 lb

## 2024-04-12 DIAGNOSIS — R001 Bradycardia, unspecified: Secondary | ICD-10-CM

## 2024-04-12 DIAGNOSIS — S161XXD Strain of muscle, fascia and tendon at neck level, subsequent encounter: Secondary | ICD-10-CM

## 2024-04-12 DIAGNOSIS — K0510 Chronic gingivitis, plaque induced: Secondary | ICD-10-CM

## 2024-04-12 DIAGNOSIS — K089 Disorder of teeth and supporting structures, unspecified: Secondary | ICD-10-CM

## 2024-04-12 DIAGNOSIS — I639 Cerebral infarction, unspecified: Secondary | ICD-10-CM | POA: Diagnosis not present

## 2024-04-12 DIAGNOSIS — S161XXA Strain of muscle, fascia and tendon at neck level, initial encounter: Secondary | ICD-10-CM | POA: Diagnosis not present

## 2024-04-12 MED ORDER — NYSTATIN 100000 UNIT/ML MT SUSP: 5.0000 mL | Freq: Three times a day (TID) | OROMUCOSAL | 0 refills | Status: AC | PRN

## 2024-04-12 MED ORDER — CYCLOBENZAPRINE HCL 7.5 MG PO TABS: 7.5000 mg | ORAL_TABLET | Freq: Three times a day (TID) | ORAL | 0 refills | Status: AC | PRN

## 2024-04-12 MED ORDER — IBUPROFEN 800 MG PO TABS: 800.0000 mg | ORAL_TABLET | Freq: Three times a day (TID) | ORAL | 0 refills | Status: DC | PRN

## 2024-04-12 NOTE — Progress Notes (Signed)
 CC: urgent care follow up  HPI:  [name] is a 68 y.o. with medical history of HTN, HLD, OA, GERD presenting to [facility] for urgent care follow up. She went to the urgent care on 05/01 and 05/20 for dental pain and neck pain.   Please see problem-based list for further details, assessments, and plans.  Past Medical History:  Diagnosis Date   Acute urinary retention s/p Foley 01/07/2012   Allergy    Anemia    Asthma    CAD (coronary artery disease)    a. 12/2013 s/p CABG x 5 Dr. Jeb Miner (LIMA to LAD, SVG to diagonal, SVG to OM1, SVG to OM2, SVG to PDA)   Carotid arterial disease (HCC)    a. 12/2013 Carotid U/S: 1-39% bilat ICA stenosis.   Carpal tunnel syndrome, bilateral    GERD (gastroesophageal reflux disease)    Hemorrhoids, internal, with bleeding & prolapse 12/13/2011   Hernia, abdominal    History of blood transfusion    CABG and hysterectomy   History of kidney stones    Hyperlipidemia    Hypertension    Iron  deficiency 12/07/2011   Keloid scar    a. Sternal Keloid s/p CABG with ongoing pain.   Morbid obesity (HCC)    Myocardial infarct (HCC)    2015   Neuropathy    Osteoarthritis    Osteoporosis    PONV (postoperative nausea and vomiting)    Seasonal allergies    Sleep apnea    no CPAP machine; sleep study 02/2010 REM AHI 61.7/hr, total sleep REM 14.8/hr   Tooth abscess    Uterine cancer (HCC) 05/2014   clinical stage IA grade 1 endometrioid endometrial cancer     Current Outpatient Medications (Cardiovascular):    amLODipine  (NORVASC ) 5 MG tablet, Take 1 tablet (5 mg total) by mouth daily.   rosuvastatin  (CRESTOR ) 20 MG tablet, Take 20 mg by mouth daily.  Current Outpatient Medications (Respiratory):    albuterol  (VENTOLIN  HFA) 108 (90 Base) MCG/ACT inhaler, Inhale 2 puffs into the lungs every 6 (six) hours as needed for wheezing or shortness of breath. (Patient not taking: Reported on 04/04/2024)   magic mouthwash (nystatin , lidocaine ,  diphenhydrAMINE ) suspension*, Take 5 mLs by mouth 3 (three) times daily as needed for mouth pain.  Current Outpatient Medications (Analgesics):    aspirin  EC 81 MG tablet, Take 1 tablet (81 mg total) by mouth daily. Swallow whole.  Current Outpatient Medications (Hematological):    clopidogrel  (PLAVIX ) 75 MG tablet, Take 1 tablet (75 mg total) by mouth daily.  Current Outpatient Medications (Other):    cyclobenzaprine (FEXMID) 7.5 MG tablet, Take 1 tablet (7.5 mg total) by mouth 3 (three) times daily as needed for up to 10 days for muscle spasms. Start taking at night and avoid heavy machinery use and driving after taking this.   amoxicillin  (AMOXIL ) 875 MG tablet, Take 1 tablet (875 mg total) by mouth 2 (two) times daily for 7 days.   chlorhexidine  (PERIDEX ) 0.12 % solution, Use as directed 15 mLs in the mouth or throat 2 (two) times daily. Rinse your mouth twice daily after brushing teeth. Swish the solution in mouth thoroughly for 30 seconds, then spit it out. Do not swallow the rinse.  Do not eat, drink, smoke or rinse your mouth with water  immediately after using this.   cholecalciferol (VITAMIN D3) 25 MCG (1000 UNIT) tablet, Take 1 tablet (1,000 Units total) by mouth daily.   magic mouthwash (nystatin , lidocaine , diphenhydrAMINE ) suspension*,  Take 5 mLs by mouth 3 (three) times daily as needed for mouth pain.   omeprazole  (PRILOSEC) 20 MG capsule, Take 1 capsule (20 mg total) by mouth daily before breakfast. Please schedule a yearly follow up for further refills. Thank you   Vibegron  75 MG TABS, Take 1 tablet (75 mg total) by mouth daily. (Patient taking differently: Take 75 mg by mouth See admin instructions. Take 1 tablet (75mg ) 4 days a week on Monday, Wednesday, Friday, Saturday.) * These medications belong to multiple therapeutic classes and are listed under each applicable group.  Review of Systems:  Review of system negative unless stated in the problem list or HPI.    Physical  Exam:  Vitals:   04/12/24 0906 04/12/24 0935  BP: (!) 158/75 (!) 160/78  Pulse: 78 67  Temp: 98.2 F (36.8 C)   TempSrc: Oral   SpO2: 98%   Weight: 281 lb 6.4 oz (127.6 kg)   Height: 5\' 8"  (1.727 m)    Physical Exam General: NAD HENT: NCAT, slight gingival irritation. Lungs: CTAB, no wheeze, rhonchi or rales.  Cardiovascular: Normal heart sounds, no r/m/g, 2+ pulses in all extremities. No LE edema Abdomen: No TTP, normal bowel sounds MSK: No asymmetry or muscle atrophy. Limited active and passive ROM. Good strength in bilateral upper extremities.  Skin: no lesions noted on exposed skin Neuro: Alert and oriented x4. CN grossly intact Psych: Normal mood and normal affect   Assessment & Plan:   [name] with multiple urgent care visits for mouth pain.  She was prescribed Augmentin  initially and developed a rash to that and was then prescribed amoxicillin .  She states she took 1 dose of this.  Exam shows slight inflammation of the gums which appears to be secondary to her partials that are poorly fitting.  She has Magic mouthwash which she states is helping her.  Offered refills on this and advised her to hold the antibiotics as there is no sign of any infection.  No other signs of infection including fevers, or chills.  Cervical strain, acute Patient with recent urgent care visit for acute cervical strain.  She states she woke up with neck pain and stiffness.  Neck pain and stiffness was severe prompting her to go to the urgent care.  She received Toradol  and steroid injection that improve her neck pain significantly.  On exam patient with limited active and passive range of motion due to muscle tightness and pain.  Patient feels relief with palpation of the trapezius muscle.  Initial plan was to give her naproxen  but given she is on dual antiplatelet therapy we will hold off on this and just prescribe her a muscle relaxer.  Also will send a referral for physical therapy.  Gave  patient exercises to perform in the meantime.  Advised to use Tylenol  up to 3 g, heat pack, Bengay as needed.  Lemierre's syndrome was considered but highly unlikely given no infection and neck pain consistent with cervical strain.   See Encounters Tab for problem based charting.  Patient Discussed with Dr. Versa Gore, MD Tommas Fragmin. [facility] Internal Medicine Residency, PGY-3

## 2024-04-12 NOTE — Patient Instructions (Addendum)
 Ms.Keeshia A Boyajian, it was a pleasure seeing you today! You endorsed feeling well today. Below are some of the things we talked about this visit. We look forward to seeing you in the follow up appointment!  Today we discussed: Start taking flexiril for your neck pain. I am referring you physical therapy.   You do not need to take amoxicillin  as your mouth looks well. Follow up with dentist and continue to use magic mouth was for now.   I have ordered the following labs today:  Lab Orders  No laboratory test(s) ordered today      Referrals ordered today:    Referral Orders         Ambulatory referral to Physical Therapy      I have ordered the following medication/changed the following medications:   Stop the following medications: Medications Discontinued During This Encounter  Medication Reason   magic mouthwash (nystatin , lidocaine , diphenhydrAMINE ) suspension Reorder     Start the following medications: Meds ordered this encounter  Medications   ibuprofen  (ADVIL ) 800 MG tablet    Sig: Take 1 tablet (800 mg total) by mouth every 8 (eight) hours as needed for up to 10 days for moderate pain (pain score 4-6).    Dispense:  30 tablet    Refill:  0   magic mouthwash (nystatin , lidocaine , diphenhydrAMINE ) suspension    Sig: Take 5 mLs by mouth 3 (three) times daily as needed for mouth pain.    Dispense:  180 mL    Refill:  0   cyclobenzaprine (FEXMID) 7.5 MG tablet    Sig: Take 1 tablet (7.5 mg total) by mouth 3 (three) times daily as needed for up to 10 days for muscle spasms. Start taking at night and avoid heavy machinery use and driving after taking this.    Dispense:  30 tablet    Refill:  0     Follow-up: follow up in one month   Please make sure to arrive 15 minutes prior to your next appointment. If you arrive late, you may be asked to reschedule.   We look forward to seeing you next time. Please call our clinic at [phone] if you have any questions or  concerns. The best time to call is Monday-Friday from 9am-4pm, but there is someone available 24/7. If after hours or the weekend, call the main hospital number and ask for the Internal Medicine Resident On-Call. If you need medication refills, please notify your pharmacy one week in advance and they will send us  a request.  Thank you for letting us  take part in your care. Wishing you the best!  Thank you, Jackolyn Masker, MD

## 2024-04-13 ENCOUNTER — Ambulatory Visit (HOSPITAL_BASED_OUTPATIENT_CLINIC_OR_DEPARTMENT_OTHER): Payer: Self-pay | Admitting: Internal Medicine

## 2024-04-13 ENCOUNTER — Encounter: Payer: Self-pay | Admitting: Neurology

## 2024-04-13 DIAGNOSIS — K0510 Chronic gingivitis, plaque induced: Secondary | ICD-10-CM

## 2024-04-13 DIAGNOSIS — S161XXA Strain of muscle, fascia and tendon at neck level, initial encounter: Secondary | ICD-10-CM | POA: Insufficient documentation

## 2024-04-13 HISTORY — DX: Chronic gingivitis, plaque induced: K05.10

## 2024-04-13 NOTE — Assessment & Plan Note (Signed)
 Patient with recent urgent care visit for acute cervical strain.  She states she woke up with neck pain and stiffness.  Neck pain and stiffness was severe prompting her to go to the urgent care.  She received Toradol  and steroid injection that improve her neck pain significantly.  On exam patient with limited active and passive range of motion due to muscle tightness and pain.  Patient feels relief with palpation of the trapezius muscle.  Initial plan was to give her naproxen  but given she is on dual antiplatelet therapy we will hold off on this and just prescribe her a muscle relaxer.  Also will send a referral for physical therapy.  Gave patient exercises to perform in the meantime.  Advised to use Tylenol  up to 3 g, heat pack, Bengay as needed.  Lemierre's syndrome was considered but highly unlikely given no infection and neck pain consistent with cervical strain.

## 2024-04-13 NOTE — Assessment & Plan Note (Signed)
 Patient with multiple urgent care visits for mouth pain.  She was prescribed Augmentin  initially and developed a rash to that and was then prescribed amoxicillin .  She states she took 1 dose of this.  Exam shows slight inflammation of the gums which appears to be secondary to her partials that are poorly fitting.  She has Magic mouthwash which she states is helping her.  Offered refills on this and advised her to hold the antibiotics as there is no sign of any infection.  No other signs of infection including fevers, or chills.

## 2024-04-14 ENCOUNTER — Other Ambulatory Visit: Payer: Self-pay | Admitting: Gastroenterology

## 2024-04-17 NOTE — Progress Notes (Signed)
 Internal Medicine Clinic Attending  Case discussed with the resident at the time of the visit.  We reviewed the resident's history and exam and pertinent patient test results.  I agree with the assessment, diagnosis, and plan of care documented in the resident's note.

## 2024-04-19 ENCOUNTER — Other Ambulatory Visit: Payer: Self-pay | Admitting: Internal Medicine

## 2024-04-19 NOTE — Telephone Encounter (Signed)
 Medication discontinued 04/03/24.

## 2024-05-11 ENCOUNTER — Encounter: Payer: Self-pay | Admitting: Obstetrics and Gynecology

## 2024-05-11 ENCOUNTER — Ambulatory Visit: Admitting: Obstetrics and Gynecology

## 2024-05-11 VITALS — BP 131/84 | HR 75

## 2024-05-11 DIAGNOSIS — N813 Complete uterovaginal prolapse: Secondary | ICD-10-CM

## 2024-05-11 DIAGNOSIS — N3281 Overactive bladder: Secondary | ICD-10-CM | POA: Diagnosis not present

## 2024-05-11 DIAGNOSIS — Z960 Presence of urogenital implants: Secondary | ICD-10-CM

## 2024-05-11 DIAGNOSIS — N993 Prolapse of vaginal vault after hysterectomy: Secondary | ICD-10-CM

## 2024-05-11 DIAGNOSIS — N8110 Cystocele, unspecified: Secondary | ICD-10-CM

## 2024-05-11 MED ORDER — ESTRADIOL 0.1 MG/GM VA CREA: 0.5000 g | TOPICAL_CREAM | VAGINAL | 11 refills | Status: AC

## 2024-05-11 NOTE — Progress Notes (Signed)
 [facility]   Subjective:     Chief Complaint:  Chief Complaint  Patient presents with   Pessary Check    MARGERIE FRAISER is a 68 y.o. female is here for pessary check/cleaning.   History of Present Illness: ALISON KUBICKI is a 68 y.o. female with stage IV pelvic organ prolapse and OAB who presents for a pessary check. She is using a size #7 long stem gellhorn pessary. The pessary came out with a bowel movement and she could not re-insert and forgot to bring it today. She then put in her #6 Corwin Dings which is not supporting her bladder. She is not using vaginal estrogen but is using coconut oil. She denies vaginal bleeding.  Patient would like to have surgery to fix the prolapse but she recently had a stroke and had to have cardiac monitor in place and is dealing with dental issues.   Past Medical History: Patient  has a past medical history of Acute urinary retention s/p Foley (01/07/2012), Allergy, Anemia, Asthma, CAD (coronary artery disease), Carotid arterial disease (HCC), Carpal tunnel syndrome, bilateral, GERD (gastroesophageal reflux disease), Hemorrhoids, internal, with bleeding & prolapse (12/13/2011), Hernia, abdominal, History of blood transfusion, History of kidney stones, Hyperlipidemia, Hypertension, Iron  deficiency (12/07/2011), Keloid scar, Morbid obesity (HCC), Myocardial infarct (HCC), Neuropathy, Osteoarthritis, Osteoporosis, PONV (postoperative nausea and vomiting), Seasonal allergies, Sleep apnea, Tooth abscess, and Uterine cancer (HCC) (05/2014).   Past Surgical History: She  has a past surgical history that includes Knee surgery (Bilateral, 1999); Total knee arthroplasty (Left, 04/29/2011); Wisdom tooth extraction; Colonoscopy (2009); Knee arthroscopy (1991); transanal hemorrhoidal dearterliaization (01/06/2012); Coronary artery bypass graft (N/A, 01/09/2014); Intraoprative transesophageal echocardiogram (N/A, 01/09/2014); TEE without cardioversion (01/2010); NM  MYOCAR PERF WALL MOTION (01/2010); Cardiac catheterization; Multiple extractions with alveoloplasty (N/A, 04/11/2014); Vaginal hysterectomy (N/A, 06/03/2014); Unilateral salpingectomy (Right, 06/03/2014); left heart catheterization with coronary angiogram (N/A, 01/04/2014); Breast reduction surgery (Bilateral, 10/10/2015); Colonoscopy w/ biopsies and polypectomy; Lesion excision with complex repair (N/A, 05/07/2016); Cataract extraction (Right); Artery Biopsy (Left, 07/10/2021); and bladder tack.   Medications: She has a current medication list which includes the following prescription(s): albuterol , amlodipine , chlorhexidine , cholecalciferol, clopidogrel , [START ON 05/14/2024] estradiol , magic mouthwash (nystatin , lidocaine , diphenhydrAMINE ) suspension, omeprazole , rosuvastatin , and vibegron .   Allergies: Patient is allergic to cephalosporins, eicosapentaenoic acid (epa) (fish), fish-derived products, flagyl  [metronidazole ], peanuts [peanut oil], shrimp [shellfish allergy], latex, cipro  [ciprofloxacin  hcl], diovan  hct [valsartan -hydrochlorothiazide ], methotrexate  derivatives, aleve  [naproxen ], other, prednisone , and zestril  [lisinopril ].   Social History: Patient  reports that she has never smoked. She has never been exposed to tobacco smoke. She has never used smokeless tobacco. She reports that she does not drink alcohol  and does not use drugs.      Objective:    Physical Exam: BP 131/84   Pulse 75   LMP 01/13/2014 Comment: spotting in Feb and bleeding since April Gen: No apparent distress, A&O x 3. Detailed Urogynecologic Evaluation:  Pelvic Exam: Normal external female genitalia; Bartholin's and Skene's glands normal in appearance; urethral meatus normal in appearance, no urethral masses or discharge. The pessary was noted to be dislodged. It was removed and cleaned. Speculum exam revealed no lesions in the vagina. The pessary was changed to a #5 Cube (Lot [id number])    Assessment/Plan:     Assessment: Ms. Rollyson is a 68 y.o. with stage IV pelvic organ prolapse and OAB here for a pessary check. She is doing well.  Plan: She will keep the pessary in place until next visit. She will continue  to use estrogen. She will follow-up in 3 months for a pessary check or sooner as needed.   New script for vaginal estrogen sent in today. Patient has vaginal atrophy on exam. She would benefit from estrogen cream. Patient to use a blueberry sized amount into the vagina. She may use this nightly for 2 weeks and then twice weekly after. We discussed using her finger instead of using the applicator.   Encouraged patient to call if she has any concerns regarding her pessary or if it is coming out of the vagina with bowel movements.   All questions were answered.

## 2024-05-11 NOTE — Patient Instructions (Signed)
 Use the estrogen cream at least twice a week. There is an applicator but I suggest using your finger because it is easier to applu.

## 2024-05-14 ENCOUNTER — Encounter: Admitting: Student

## 2024-05-14 NOTE — Progress Notes (Deleted)
 04/12/2024  [name] with multiple urgent care visits for mouth pain.  She was prescribed Augmentin  initially and developed a rash to that and was then prescribed amoxicillin .  She states she took 1 dose of this.  Exam shows slight inflammation of the gums which appears to be secondary to her partials that are poorly fitting.  She has Magic mouthwash which she states is helping her.  Offered refills on this and advised her to hold the antibiotics as there is no sign of any infection.  No other signs of infection including fevers, or chills.   Cervical strain, acute Patient with recent urgent care visit for acute cervical strain.  She states she woke up with neck pain and stiffness.  Neck pain and stiffness was severe prompting her to go to the urgent care.  She received Toradol  and steroid injection that improve her neck pain significantly.  On exam patient with limited active and passive range of motion due to muscle tightness and pain.  Patient feels relief with palpation of the trapezius muscle.  Initial plan was to give her naproxen  but given she is on dual antiplatelet therapy we will hold off on this and just prescribe her a muscle relaxer.  Also will send a referral for physical therapy.  Gave patient exercises to perform in the meantime.  Advised to use Tylenol  up to 3 g, heat pack, Bengay as needed.  Lemierre's syndrome was considered but highly unlikely given no infection and neck pain consistent with cervical strain.\     Essential hypertension (Chronic)  Chronic and stable on amlodipine  monotherapy. BP at goal today, 114/50. No side effects. Continue amlodipine  5 mg.      Relevant Medications  rosuvastatin  (CRESTOR ) 20 MG tablet  Coronary atherosclerosis of native coronary artery (Chronic)  Chronic and stable. No chest pain and good functional status. Now on DAPT for 3 months for recent CVA, plan to continue clopidogrel  following. Not currently on beta-blocker. F/u with  cardiology planned for tomorrow, 5/14. Continue aspirin  81 mg, plavix  75 mg, rosuvastatin  20 mg.      Relevant Medications  rosuvastatin  (CRESTOR ) 20 MG tablet  Atherosclerosis of aorta (HCC) (Chronic)  Seen on imaging. Diffuse atherosclerotic disease c/b CAD s/p CABG and stroke. Continue DAPT for 3 months, then Plavix  monotherapy. Continue statin.      Relevant Medications  rosuvastatin  (CRESTOR ) 20 MG tablet  Other supraventricular tachycardia (HCC)  Heart regular rate and rhythm on today's examination. Cardiac monitor ordered and returned, yet to be read. F/u with cardiology tomorrow, 5/14.      Relevant Medications  rosuvastatin  (CRESTOR ) 20 MG tablet    Respiratory  Obstructive sleep apnea (Chronic)  Very mild OSA on sleep study in 2018. Not currently using CPAP and no sleep concerns. CTM.        Digestive  GERD (Chronic)  Continued GERD symptoms, not significantly improved with omeprazole  20 mg daily. Taking TUMS daily for additional relief. Increase to 40 mg daily.       Poor dentition  Seen in UC for dental infection, treated with amoxicillin . [name] was only able to take four days of 7 day course due to development of a yeast infection. She has not yet established with an oral surgeon but is planning to call today due to several concerns. She would like to have all bottom teeth remove and concerns regarding upper partial dentures. Encouraged her to follow-up.        Musculoskeletal and Integument  Polyarthritis with  positive rheumatoid factor (HCC) (Chronic)  She has not seen rheumatology, Dr. Jeannetta, in some time. She is no longer taking hydroxychloroquine . No specific joint affected today but describes diffuse joint pain that she attributes to rosuvastatin . I have asked her to make an appointment with Dr. Jeannetta and she is willing to return for an appointment.         Other  Hyperlipidemia (Chronic)  Started on Crestor  at prior office visit. Patient reports taking  20 mg daily. She has been having joint pains that she suspects are secondary to this medication. We discussed the importance of cholesterol management in prevention of stroke and heart disease. Joint pain unlikely to be secondary to statin given other clinical explanations. Will f/u lipid panel today and discuss options moving forward.      Relevant Medications  rosuvastatin  (CRESTOR ) 20 MG tablet  Other Relevant Orders  Lipid Profile  Morbid obesity due to excess calories (HCC) (Chronic)  BMI 37 with serious comorbidity of hypertension, stroke, and CAD. Decreased over the last several years but stable in the last year. Continue healthy activity and nutrition.      Depression (Chronic)  Stable. PHQ9 of 1 during recent AWV. Not currently on therapy.      Vitamin D  deficiency (Chronic)  Vitamin D  9 at last check, started on supplementation. Recheck today.      History of stroke  Ischemic stroke in 01/2024. Symptoms of facial droop and dysarthria have entirely resolved. She continues on aspirin  and plavix  for 3 months, followed by plavix  monotherapy. Remains on high-intensity statin. BP at goal.    Plan -Continue DAPT for 3 months, followed by Plavix  monotherapy -Continue rosuvastatin  20 mg daily, recheck lipids -F/u with cardiology re: heart monitor        Other Visit Diagnoses         Screening mammogram for breast cancer    -  Primary      History of vitamin D  deficiency        Relevant Orders    VITAMIN D  25 Hydroxy (Vit-D Deficiency, Fractures)      History of asthma        Relevant Medications    albuterol  (VENTOLIN  HFA) 108 (90 Base) MCG/ACT inhaler      CC: {Clinic Visit Type:31040}  HPI:  [name] is a 68 y.o. female with pertinent PMH of HTN, OSA, peripheral neuropathy, OA, RF positive polyarthritis, depression, vitamin D  deficiency, ischemic CVA 01/2024, and obesity who presents as above. Please see assessment and plan below for further  details.  Medications: Current Outpatient Medications  Medication Instructions   albuterol  (VENTOLIN  HFA) 108 (90 Base) MCG/ACT inhaler 2 puffs, Inhalation, Every 6 hours PRN   amLODipine  (NORVASC ) 5 mg, Oral, Daily   chlorhexidine  (PERIDEX ) 0.12 % solution 15 mLs, Mouth/Throat, 2 times daily, Rinse your mouth twice daily after brushing teeth. Swish the solution in mouth thoroughly for 30 seconds, then spit it out. Do not swallow the rinse.  Do not eat, drink, smoke or rinse your mouth with water  immediately after using this.   cholecalciferol (VITAMIN D3) 1,000 Units, Oral, Daily   clopidogrel  (PLAVIX ) 75 mg, Oral, Daily   estradiol  (ESTRACE ) 0.5 g, Vaginal, 2 times weekly, Place 0.5g nightly for two weeks then twice a week after   magic mouthwash (nystatin , lidocaine , diphenhydrAMINE ) suspension 5 mLs, Oral, 3 times daily PRN   omeprazole  (PRILOSEC) 20 mg, Oral, Daily before breakfast, Please schedule a yearly follow up for further refills.  Thank you   rosuvastatin  (CRESTOR ) 20 mg, Daily   Vibegron  75 mg, Oral, Daily     Review of Systems:   Pertinent items noted in HPI and/or A&P.  Physical Exam:  There were no vitals filed for this visit.  Constitutional:***. In no acute distress. HEENT: Normocephalic, atraumatic, Sclera non-icteric, PERRL, EOM intact Cardio:Regular rate and rhythm. 2+ bilateral {PulseLoc:28294} pulses. Pulm:Clear to auscultation bilaterally. Normal work of breathing on room air. Abdomen: Soft, non-tender, non-distended, positive bowel sounds. FDX:Wzhjupcz for extremity edema. Skin:Warm and dry. Neuro:Alert and oriented x3. No focal deficit noted. Psych:Pleasant mood and affect.   Assessment & Plan:   No problem-specific Assessment & Plan notes found for this encounter.    Patient {GC/GE:[id number]::discussed with,seen with} {JGIMTSattending2025/2026:32954}  Fairy Pool, DO [facility] Internal Medicine Resident PGY-2 Clinic Phone:  [phone] Please contact the on call pager at [phone] for any urgent or emergent needs.

## 2024-05-15 ENCOUNTER — Encounter: Payer: Self-pay | Admitting: Internal Medicine

## 2024-05-17 ENCOUNTER — Other Ambulatory Visit: Payer: Self-pay | Admitting: Gastroenterology

## 2024-05-29 ENCOUNTER — Encounter: Payer: Self-pay | Admitting: Neurology

## 2024-05-29 ENCOUNTER — Ambulatory Visit (INDEPENDENT_AMBULATORY_CARE_PROVIDER_SITE_OTHER): Admitting: Neurology

## 2024-05-29 VITALS — BP 128/83 | HR 71 | Ht 68.0 in | Wt 281.0 lb

## 2024-05-29 DIAGNOSIS — G609 Hereditary and idiopathic neuropathy, unspecified: Secondary | ICD-10-CM | POA: Diagnosis not present

## 2024-05-29 DIAGNOSIS — I6381 Other cerebral infarction due to occlusion or stenosis of small artery: Secondary | ICD-10-CM

## 2024-05-29 NOTE — Progress Notes (Signed)
 [facility] Neurology Division Clinic Note - Initial Visit   Date: 05/29/2024   [name] MRN: [id number] DOB: July 29, 1956   Dear Dr Karna:  Thank you for your kind referral of [name] for consultation of stroke. Although her history is well known to you, please allow us  to reiterate it for the purpose of our medical record. The patient was accompanied to the clinic by self.   [name] is a 68 y.o. right-handed female with hypertension, hyperlipidemia, GERD, and uterine cancer presenting for evaluation of stroke.   IMPRESSION/PLAN: Left basal ganglia stroke, most likely small vessel disease, manifesting with right facial droop (resolved).  Imaging shows moderate stenosis distal left M1 and right P2.  She has completed DAPT and now on plavix  75mg  daily.  Continue crestor  40mg  daily (LDL well controlled 66).  BP is well-controlled. She does not smoke nor have diabetes.  Idiopathic neuropathy affecting bilateral feet.  NCS/EMG of the left leg in 2018 shows sensorimotor polyneuropathy.   Return to clinic in 6 months  ------------------------------------------------------------- History of present illness: In March 2025, she developed sudden onset of right facial droop and went to the ER where MRI brain showed 13mm left internal capsule ischemic stroke.  MRA shows moderate stenosis at the left M1 and right P2 segment.  She was started on DAPT x 3 months and now on plavix  75mg .  Cardiac monitor did not show afib, frequent PVC.  She no longer has weakness of the face.   She is compliant with her medications.    She also reports numbness involving both feet, which is worse on the left.  It is noticeable in the evening at rest. She has some imbalance and uses a cane, which she attributes to right knee pain.    Out-side paper records, electronic medical record, and images have been reviewed where available and summarized as:  MRI brain wo contrast 01/21/2024: 1. 13 mm acute  nonhemorrhagic infarct of the left internal capsule. 2. Periventricular T2 hyperintensities are moderately advanced for age. This likely reflects the sequela of chronic microvascular ischemia.  MRA brain wo contrast 01/21/2024: 1. No large vessel occlusion. 2. Moderate focal stenosis in the distal left M1 segment. 3. Moderate stenosis of the proximal right P2 segment. 4. Atherosclerotic irregularity within the cavernous internal carotid arteries bilaterally without a significant stenosis through the ICA termini.  MRI lumbar spine wo contrast 05/29/2021: 1. Lumbar spondylosis has mildly progressed from previous abdominal CT in 2018. No acute findings are evident, and there is no high-grade spinal stenosis or definite nerve root encroachment. 2. Mild multifactorial spinal stenosis at L3-4 with mild lateral recess and foraminal narrowing bilaterally. 3. Minimal foraminal narrowing bilaterally at L4-5.   Lab Results  Component Value Date   CHOL 132 04/03/2024   HDL 50 04/03/2024   LDLCALC 66 04/03/2024   TRIG 80 04/03/2024   CHOLHDL 2.6 04/03/2024    Lab Results  Component Value Date   HGBA1C 4.9 12/16/2023   Lab Results  Component Value Date   VITAMINB12 438 12/26/2017   Lab Results  Component Value Date   TSH 1.480 12/16/2023   Lab Results  Component Value Date   ESRSEDRATE 43 (H) 04/26/2023    Past Medical History:  Diagnosis Date   Acute urinary retention s/p Foley 01/07/2012   Allergy    Anemia    Asthma    CAD (coronary artery disease)    a. 12/2013 s/p CABG x 5 Dr. Obadiah (LIMA  to LAD, SVG to diagonal, SVG to OM1, SVG to OM2, SVG to PDA)   Carotid arterial disease (HCC)    a. 12/2013 Carotid U/S: 1-39% bilat ICA stenosis.   Carpal tunnel syndrome, bilateral    GERD (gastroesophageal reflux disease)    Hemorrhoids, internal, with bleeding & prolapse 12/13/2011   Hernia, abdominal    History of blood transfusion    CABG and hysterectomy   History of kidney  stones    Hyperlipidemia    Hypertension    Iron  deficiency 12/07/2011   Keloid scar    a. Sternal Keloid s/p CABG with ongoing pain.   Morbid obesity (HCC)    Myocardial infarct (HCC)    2015   Neuropathy    Osteoarthritis    Osteoporosis    PONV (postoperative nausea and vomiting)    Seasonal allergies    Sleep apnea    no CPAP machine; sleep study 02/2010 REM AHI 61.7/hr, total sleep REM 14.8/hr   Tooth abscess    Uterine cancer (HCC) 05/2014   clinical stage IA grade 1 endometrioid endometrial cancer    Past Surgical History:  Procedure Laterality Date   ARTERY BIOPSY Left 07/10/2021   Procedure: LEFT TEMPORAL ARTERY BIOPSY;  Surgeon: Serene Gaile ORN, MD;  Location: [facility] OR;  Service: Vascular;  Laterality: Left;   bladder tack     BREAST REDUCTION SURGERY Bilateral 10/10/2015   Procedure: BILATERAL MAMMARY REDUCTION  (BREAST) WITH FREE NIPPLE GRAFT;  Surgeon: Earlis Ranks, MD;  Location: [facility] OR;  Service: Plastics;  Laterality: Bilateral;   CARDIAC CATHETERIZATION     CATARACT EXTRACTION Right    COLONOSCOPY  2009   COLONOSCOPY W/ BIOPSIES AND POLYPECTOMY     CORONARY ARTERY BYPASS GRAFT N/A 01/09/2014   Procedure: CORONARY ARTERY BYPASS GRAFTING (CABG) x 5 using left internal mammary artery and right leg greater saphenous vein harvested endoscopically;  Surgeon: Maude Fleeta Ochoa, MD;  Location: [facility] OR;  Service: Open Heart Surgery;  Laterality: N/A;  please use bed extenders and breast binder   INTRAOPERATIVE TRANSESOPHAGEAL ECHOCARDIOGRAM N/A 01/09/2014   Procedure: INTRAOPERATIVE TRANSESOPHAGEAL ECHOCARDIOGRAM;  Surgeon: Maude Fleeta Ochoa, MD;  Location: [facility] OR;  Service: Open Heart Surgery;  Laterality: N/A;   KNEE ARTHROSCOPY  1991   KNEE SURGERY Bilateral 1999   LEFT HEART CATHETERIZATION WITH CORONARY ANGIOGRAM N/A 01/04/2014   Procedure: LEFT HEART CATHETERIZATION WITH CORONARY ANGIOGRAM;  Surgeon: Victory ORN Claudene DOUGLAS, MD;  Location: [facility] CATH LAB;  Service:  Cardiovascular;  Laterality: N/A;   LESION EXCISION WITH COMPLEX REPAIR N/A 05/07/2016   Procedure: COMPLEX REPAIR OF CHEST 16 CM;  Surgeon: Earlis Ranks, MD;  Location: [facility] OR;  Service: Plastics;  Laterality: N/A;   MULTIPLE EXTRACTIONS WITH ALVEOLOPLASTY N/A 04/11/2014   Procedure: Extraction of tooth #'s 1,2,8,16 with alveoloplasty, maxillary tuberosity reductions, and gross debridement of remaining teeth.;  Surgeon: Tanda JULIANNA Fanny, DDS;  Location: [facility] OR;  Service: Oral Surgery;  Laterality: N/A;   NM MYOCAR PERF WALL MOTION  01/2010   dipyridamole myoview  - moderate perfusion defect in basal inferoseptal, basal inferior, mid inferoseptal, mid inferior, apical inferior region; EF 56%   TEE WITHOUT CARDIOVERSION  01/2010   EF 60-65%, small, flat, non-infiltrating, calcified, fixed apical/septal mass   TOTAL KNEE ARTHROPLASTY Left 04/29/2011   transanal hemorrhoidal dearterliaization  01/06/2012   with external hemorrhoid removal   UNILATERAL SALPINGECTOMY Right 06/03/2014   Procedure: UNILATERAL SALPINGECTOMY;  Surgeon: Elveria Mungo, MD;  Location: [facility] ORS;  Service: Gynecology;  Laterality: Right;   VAGINAL HYSTERECTOMY N/A 06/03/2014   Procedure: HYSTERECTOMY VAGINAL;  Surgeon: Elveria Mungo, MD;  Location: [facility] ORS;  Service: Gynecology;  Laterality: N/A;   WISDOM TOOTH EXTRACTION       Medications:  Outpatient Encounter Medications as of 05/29/2024  Medication Sig   albuterol  (VENTOLIN  HFA) 108 (90 Base) MCG/ACT inhaler Inhale 2 puffs into the lungs every 6 (six) hours as needed for wheezing or shortness of breath.   amLODipine  (NORVASC ) 5 MG tablet Take 1 tablet (5 mg total) by mouth daily.   chlorhexidine  (PERIDEX ) 0.12 % solution Use as directed 15 mLs in the mouth or throat 2 (two) times daily. Rinse your mouth twice daily after brushing teeth. Swish the solution in mouth thoroughly for 30 seconds, then spit it out. Do not swallow the rinse.  Do not eat, drink,  smoke or rinse your mouth with water  immediately after using this.   cholecalciferol (VITAMIN D3) 25 MCG (1000 UNIT) tablet Take 1 tablet (1,000 Units total) by mouth daily.   clopidogrel  (PLAVIX ) 75 MG tablet Take 1 tablet (75 mg total) by mouth daily.   estradiol  (ESTRACE ) 0.1 MG/GM vaginal cream Place 0.5 g vaginally 2 (two) times a week. Place 0.5g nightly for two weeks then twice a week after   ibuprofen  (ADVIL ) 800 MG tablet Take 800 mg by mouth every 8 (eight) hours as needed.   magic mouthwash (nystatin , lidocaine , diphenhydrAMINE ) suspension Take 5 mLs by mouth 3 (three) times daily as needed for mouth pain.   omeprazole  (PRILOSEC) 20 MG capsule Take 1 capsule (20 mg total) by mouth daily before breakfast. Please schedule a yearly follow up for further refills. Thank you   rosuvastatin  (CRESTOR ) 20 MG tablet Take 20 mg by mouth daily.   Vibegron  75 MG TABS Take 1 tablet (75 mg total) by mouth daily.   No facility-administered encounter medications on file as of 05/29/2024.    Allergies:  Allergies  Allergen Reactions   Cephalosporins Anaphylaxis, Swelling and Other (See Comments)    Tongue swelling Gumpain   Eicosapentaenoic Acid (Epa) (Fish) Anaphylaxis and Shortness Of Breath   Fish-Derived Products Anaphylaxis and Shortness Of Breath   Flagyl  [Metronidazole ] Shortness Of Breath, Palpitations, Other (See Comments) and Hypertension    Metallic taste Dry Mouth   Peanuts [Peanut Oil] Anaphylaxis, Shortness Of Breath and Other (See Comments)    Peanut butter   Shrimp [Shellfish Allergy] Anaphylaxis and Swelling    Throat swelling   Latex Itching   Cipro  [Ciprofloxacin  Hcl] Other (See Comments)    Gagging  Muscle/body aches   Diovan  Hct [Valsartan -Hydrochlorothiazide ] Nausea Only   Methotrexate  Derivatives Nausea Only   Aleve  [Naproxen ] Nausea Only and Other (See Comments)    Headache   Other Itching and Other (See Comments)     All pain meds make her itch - has to have  something to prevent that in addition to receiving med   Prednisone  Palpitations and Other (See Comments)    Tachycardia OK with lower dosages     Zestril  [Lisinopril ] Cough         Family History: Family History  Problem Relation Age of Onset   Heart attack Mother 23   Heart disease Mother    Hypertension Mother    Diabetes Mother    Hypertension Sister    Heart failure Sister    Heart disease Sister    Diabetes Sister    Kidney disease Sister    Kidney disease Brother  Hypertension Daughter    Bipolar disorder Son    Schizophrenia Son    Anxiety disorder Son    Esophageal cancer Other        nephew   Colon cancer Neg Hx    Pancreatic cancer Neg Hx    Stomach cancer Neg Hx    Liver disease Neg Hx    Rectal cancer Neg Hx     Social History: Social History   Tobacco Use   Smoking status: Never    Passive exposure: Never   Smokeless tobacco: Never  Vaping Use   Vaping status: Never Used  Substance Use Topics   Alcohol  use: No    Alcohol /week: 0.0 standard drinks of alcohol    Drug use: No   Social History   Social History Narrative   Current Social History 08/27/2020        Patient lives alone in a handicap-accessible ground floor apartment which is 1 story. There are not steps up to the entrance the patient uses.       Patient's method of transportation is personal car or family member.      The highest level of education was 2 years of college.      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 Right Handed    Are you  currently employed ? No    What is your current occupation? Retired    Do you live at home alone? Yes   Who lives with you? No    What type of home do you live in: 1 story or 2 story? One story home               Vital Signs:  BP 128/83   Pulse 71   Ht 5' 8 (1.727 m)   Wt 281 lb (127.5 kg)   LMP 01/13/2014 Comment: spotting in Feb and bleeding since April  SpO2 99%   BMI 42.73 kg/m   Neurological Exam: MENTAL STATUS including orientation to time, place, person, recent and remote memory, attention span and concentration, language, and fund of knowledge is normal.  Speech is not dysarthric.  CRANIAL NERVES: II:  No visual field defects.     III-IV-VI: Pupils equal round and reactive to light.  Normal conjugate, extra-ocular eye movements in all directions of gaze.  No nystagmus.  No ptosis.   V:  Normal facial sensation.    VII:  Normal facial symmetry and movements.   VIII:  Normal hearing and vestibular function.   IX-X:  Normal palatal movement.   XI:  Normal shoulder shrug and head rotation.   XII:  Normal tongue strength and range of motion, no deviation or fasciculation.  MOTOR:  No atrophy, fasciculations or abnormal movements.  No pronator drift.   Upper Extremity:  Right  Left  Deltoid  5/5   5/5   Biceps  5/5   5/5  Triceps  5/5   5/5   Wrist extensors  5/5   5/5   Wrist flexors  5/5   5/5   Finger extensors  5/5   5/5   Finger flexors  5/5   5/5   Dorsal interossei  5/5   5/5   Tone (Ashworth scale)  0  0   Lower Extremity:  Right  Left  Hip flexors  5/5   5/5   Knee flexors  5/5   5/5   Knee extensors  5/5   5/5   Dorsiflexors  5/5   5/5   Plantarflexors  5/5   5/5   Toe extensors  5/5   5/5   Toe flexors  5/5   5/5   Tone (Ashworth scale)  0  0   MSRs:                                           Right        Left brachioradialis 2+  2+  biceps 2+  2+  triceps 2+  2+  patellar 2+  2+  ankle jerk 2+  2+  Hoffman no  no  plantar response down   down   SENSORY:  Absent vibration at the great toe bilaterally.  Reduced pin prick over the distal toes bilaterally.    COORDINATION/GAIT: Normal finger-to- nose-finger.  Intact rapid alternating movements bilaterally.  Gait is antalgic, due to right knee pain, slightly wide-based. She uses a cane as needed.   Thank you for allowing me to participate in patient's care.  If I can answer any additional questions, I would be pleased to do so.    Sincerely,    Gerardine Peltz K. Tobie, DO

## 2024-06-12 ENCOUNTER — Ambulatory Visit: Admitting: Obstetrics and Gynecology

## 2024-06-13 LAB — HM DIABETES EYE EXAM

## 2024-06-17 ENCOUNTER — Other Ambulatory Visit: Payer: Self-pay | Admitting: Obstetrics and Gynecology

## 2024-07-02 NOTE — Telephone Encounter (Signed)
 Noted Pt is no longer in need.  Copied from CRM #[id number]. Topic: General - Other >> Jul 02, 2024  8:55 AM Zane F wrote: Reason for CRM:   Patient is calling to inform the staff that she no longer is in need of the verification form for her handicap hang tag; Patient realized it was for 2026 and not this year.   Callback Number: [id number]

## 2024-07-03 ENCOUNTER — Ambulatory Visit: Admitting: Internal Medicine

## 2024-07-03 ENCOUNTER — Encounter: Payer: Self-pay | Admitting: Internal Medicine

## 2024-07-03 VITALS — BP 128/71 | HR 82 | Temp 98.2°F | Ht 68.0 in | Wt 281.4 lb

## 2024-07-03 DIAGNOSIS — L910 Hypertrophic scar: Secondary | ICD-10-CM | POA: Diagnosis not present

## 2024-07-03 DIAGNOSIS — K219 Gastro-esophageal reflux disease without esophagitis: Secondary | ICD-10-CM

## 2024-07-03 DIAGNOSIS — M1711 Unilateral primary osteoarthritis, right knee: Secondary | ICD-10-CM | POA: Diagnosis not present

## 2024-07-03 DIAGNOSIS — M0580 Other rheumatoid arthritis with rheumatoid factor of unspecified site: Secondary | ICD-10-CM | POA: Diagnosis not present

## 2024-07-03 DIAGNOSIS — S161XXD Strain of muscle, fascia and tendon at neck level, subsequent encounter: Secondary | ICD-10-CM

## 2024-07-03 DIAGNOSIS — K089 Disorder of teeth and supporting structures, unspecified: Secondary | ICD-10-CM

## 2024-07-03 MED ORDER — IBUPROFEN 800 MG PO TABS
800.0000 mg | ORAL_TABLET | Freq: Every day | ORAL | 0 refills | Status: DC | PRN
Start: 2024-07-03 — End: 2024-07-30

## 2024-07-03 MED ORDER — PANTOPRAZOLE SODIUM 40 MG PO TBEC: 40.0000 mg | DELAYED_RELEASE_TABLET | Freq: Every day | ORAL | 3 refills | Status: AC

## 2024-07-03 NOTE — Assessment & Plan Note (Addendum)
 Continues to struggle with right knee osteoarthritis. Severe degenerative changes seen on prior XR in 2024. No significant benefit from medical therapy or steroid injections. Using ibuprofen  1-2x/week when pain is very severe or requiring increased activity. We discussed potential adverse effects but okay to continue with limited use for now. Recommend she follow-up with orthopedics for discussion of knee replacement surgery for which she is planning to make an appointment.

## 2024-07-03 NOTE — Assessment & Plan Note (Signed)
 Following with oral surgery/dentistry for dental concerns. Currently prescribed 2 weeks of clindamycin  in preparation for dental extraction in September.

## 2024-07-03 NOTE — Patient Instructions (Signed)
 It was wonderful to see you today!  I have placed a referral for physical therapy for your neck and knee pain. I have also sent a referral to dermatology for the scar on your chest.  Please call Dr. Jeannetta (rheumatology) and orthopedics to make appointments. Please schedule your mammogram.  *For exercise classes (in-person and online), group activities, and more* (50+)            [email or web address]  Please contact your pharmacy about scheduling the shingles (Shingrix) vaccine.

## 2024-07-03 NOTE — Assessment & Plan Note (Signed)
 Seen in May 2025 for acute, right neck muscle strain. This is improving with time but still bothersome. Referred to physical therapy at May visit but was not scheduled. Referral placed again today for further improvement in functioning.

## 2024-07-03 NOTE — Assessment & Plan Note (Signed)
 GERD symptoms not significantly improved with omeprazole . Occurring about 3x/week. Only partially responsive to TUMS. Discussed switch to pantoprazole  as she feels she may have had improved control with this in the past. Discussed daily dosing rather than prn. If not improved with alternative medication, we may discuss referral to GI again for repeat EGD. Discussed limiting NSAIDs.

## 2024-07-03 NOTE — Assessment & Plan Note (Signed)
 Remote CABG surgery complicated by keloid scar formation at the sternum. She continues to have issues with irritation, pain, and itching of the area and wishes to see dermatology for further management. Referral placed today.

## 2024-07-03 NOTE — Progress Notes (Signed)
 Established Patient Office Visit  Subjective   Patient ID: [name], female    DOB: 11-27-1955  Age: 68 y.o. MRN: [id number]  Chief Complaint  Patient presents with   Medical Management of Chronic Issues    Right neck pain continues. Having dental work done. Intermittent right knee pain. Heartburn.Refill-Ibuprofen .    [name] returns to clinic today for follow-up of chronic medical conditions. Please see assessment/plan in problem-based charting for further details of today's visit.    Patient Active Problem List   Diagnosis Date Noted   Cervical strain, acute 04/13/2024   History of stroke 01/21/2024   Other supraventricular tachycardia (HCC) 12/16/2023   Hearing loss of both ears due to cerumen impaction [H61.23] 05/27/2023   Polyarthritis with positive rheumatoid factor (HCC) 03/31/2022   Tendinitis of both rotator cuffs 09/07/2021   Keloid scar of skin 09/07/2021   Pes planus of left foot 09/08/2020   Hallux valgus 09/08/2020   Glucose intolerance (impaired glucose tolerance) 07/14/2020   Vitamin D  deficiency 07/14/2020   Depression 10/16/2018   Osteoarthritis of left shoulder 10/24/2017   Peripheral neuropathy 08/19/2017   Atherosclerosis of aorta (HCC) 06/20/2015   Osteoarthritis of right knee 11/07/2014   Health care maintenance 10/09/2014   Coronary atherosclerosis of native coronary artery 01/05/2014   Morbid obesity due to excess calories (HCC) 05/10/2012   Obstructive sleep apnea 03/27/2010   Poor dentition 01/21/2009   GERD 08/06/2008   Hyperlipidemia 10/31/2007   Essential hypertension 06/30/2007     Objective:     BP 128/71 (BP Location: Right Arm, Patient Position: Sitting, Cuff Size: Large)   Pulse 82   Temp 98.2 F (36.8 C) (Oral)   Ht 5' 8 (1.727 m)   Wt 281 lb 6.4 oz (127.6 kg)   LMP 01/13/2014 Comment: spotting in Feb and bleeding since April  SpO2 99% Comment: RA  BMI 42.79 kg/m  BP Readings from Last 3 Encounters:  07/03/24  128/71  05/29/24 128/83  05/11/24 131/84   Wt Readings from Last 3 Encounters:  07/03/24 281 lb 6.4 oz (127.6 kg)  05/29/24 281 lb (127.5 kg)  04/12/24 281 lb 6.4 oz (127.6 kg)    Physical Exam Constitutional:      General: She is not in acute distress.    Appearance: Normal appearance.  Pulmonary:     Effort: Pulmonary effort is normal.  Skin:    Comments: Keloid scar over mid-sternum  Neurological:     General: No focal deficit present.     Mental Status: She is alert and oriented to person, place, and time.  Psychiatric:        Mood and Affect: Mood normal.        Behavior: Behavior normal.        Thought Content: Thought content normal.      Assessment & Plan:   Problem List Items Addressed This Visit       Digestive   GERD (Chronic)   GERD symptoms not significantly improved with omeprazole . Occurring about 3x/week. Only partially responsive to TUMS. Discussed switch to pantoprazole  as she feels she may have had improved control with this in the past. Discussed daily dosing rather than prn. If not improved with alternative medication, we may discuss referral to GI again for repeat EGD. Discussed limiting NSAIDs.       Relevant Medications   pantoprazole  (PROTONIX ) 40 MG tablet   Poor dentition   Following with oral surgery/dentistry for dental concerns. Currently prescribed 2  weeks of clindamycin  in preparation for dental extraction in September.        Musculoskeletal and Integument   Osteoarthritis of right knee - Primary (Chronic)   Continues to struggle with right knee osteoarthritis. Severe degenerative changes seen on prior XR in 2024. No significant benefit from medical therapy or steroid injections. Using ibuprofen  1-2x/week when pain is very severe or requiring increased activity. We discussed potential adverse effects but okay to continue with limited use for now. Recommend she follow-up with orthopedics for discussion of knee replacement surgery for  which she is planning to make an appointment.      Relevant Medications   ibuprofen  (ADVIL ) 800 MG tablet   Other Relevant Orders   Ambulatory referral to Physical Therapy   Keloid scar of skin (Chronic)   Remote CABG surgery complicated by keloid scar formation at the sternum. She continues to have issues with irritation, pain, and itching of the area and wishes to see dermatology for further management. Referral placed today.      Polyarthritis with positive rheumatoid factor (HCC) (Chronic)   She has not yet made a f/u appointment with rheumatology for this issue as discussed at last visit. We discussed this again today and she will make an appointment.      Cervical strain, acute   Seen in May 2025 for acute, right neck muscle strain. This is improving with time but still bothersome. Referred to physical therapy at May visit but was not scheduled. Referral placed again today for further improvement in functioning.      Relevant Orders   Ambulatory referral to Physical Therapy   Other Visit Diagnoses       Keloid scar       Relevant Orders   Ambulatory referral to Dermatology       Return in about 6 months (around 01/03/2025).    Ronnald Sergeant, MD

## 2024-07-03 NOTE — Progress Notes (Signed)
 Established Patient Office Visit  Subjective   Patient ID: AMIYA ESCAMILLA, female    DOB: [birth year]  Age: 68 y.o. MRN: [id number]  Chief Complaint  Patient presents with   Medical Management of Chronic Issues    Right neck pain continues. Having dental work done. Intermittent right knee pain. Heartburn.Refill-Ibuprofen .    Ms. Sleeper returns to clinic today for follow-up of chronic medical conditions. Please see assessment/plan in problem-based charting for further details of today's visit.    Patient Active Problem List   Diagnosis Date Noted   Cervical strain, acute 04/13/2024   History of stroke 01/21/2024   Other supraventricular tachycardia (HCC) 12/16/2023   Hearing loss of both ears due to cerumen impaction [H61.23] 05/27/2023   Polyarthritis with positive rheumatoid factor (HCC) 03/31/2022   Tendinitis of both rotator cuffs 09/07/2021   Keloid scar of skin 09/07/2021   Pes planus of left foot 09/08/2020   Hallux valgus 09/08/2020   Glucose intolerance (impaired glucose tolerance) 07/14/2020   Vitamin D  deficiency 07/14/2020   Depression 10/16/2018   Osteoarthritis of left shoulder 10/24/2017   Peripheral neuropathy 08/19/2017   Atherosclerosis of aorta (HCC) 06/20/2015   Osteoarthritis of right knee 11/07/2014   Health care maintenance 10/09/2014   Coronary atherosclerosis of native coronary artery 01/05/2014   Morbid obesity due to excess calories (HCC) 05/10/2012   Obstructive sleep apnea 03/27/2010   Poor dentition 01/21/2009   GERD 08/06/2008   Hyperlipidemia 10/31/2007   Essential hypertension 06/30/2007     Objective:     BP 128/71 (BP Location: Right Arm, Patient Position: Sitting, Cuff Size: Large)   Pulse 82   Temp 98.2 F (36.8 C) (Oral)   Ht 5' 8 (1.727 m)   Wt 281 lb 6.4 oz (127.6 kg)   LMP 01/13/2014 Comment: spotting in Feb and bleeding since April  SpO2 99% Comment: RA  BMI 42.79 kg/m  BP Readings from Last 3 Encounters:  07/03/24  128/71  05/29/24 128/83  05/11/24 131/84   Wt Readings from Last 3 Encounters:  07/03/24 281 lb 6.4 oz (127.6 kg)  05/29/24 281 lb (127.5 kg)  04/12/24 281 lb 6.4 oz (127.6 kg)    Physical Exam Constitutional:      General: She is not in acute distress.    Appearance: Normal appearance.  Pulmonary:     Effort: Pulmonary effort is normal.  Skin:    Comments: Keloid scar over mid-sternum  Neurological:     General: No focal deficit present.     Mental Status: She is alert and oriented to person, place, and time.  Psychiatric:        Mood and Affect: Mood normal.        Behavior: Behavior normal.        Thought Content: Thought content normal.      Assessment & Plan:   Problem List Items Addressed This Visit       Digestive   GERD (Chronic)   GERD symptoms not significantly improved with omeprazole . Occurring about 3x/week. Only partially responsive to TUMS. Discussed switch to pantoprazole  as she feels she may have had improved control with this in the past. Discussed daily dosing rather than prn. If not improved with alternative medication, we may discuss referral to GI again for repeat EGD. Discussed limiting NSAIDs.       Relevant Medications   pantoprazole  (PROTONIX ) 40 MG tablet   Poor dentition   Following with oral surgery/dentistry for dental concerns. Currently prescribed 2  weeks of clindamycin  in preparation for dental extraction in September.        Musculoskeletal and Integument   Osteoarthritis of right knee - Primary (Chronic)   Continues to struggle with right knee osteoarthritis. Severe degenerative changes seen on prior XR in 2024. No significant benefit from medical therapy or steroid injections. Using ibuprofen  1-2x/week when pain is very severe or requiring increased activity. We discussed potential adverse effects but okay to continue with limited use for now. Recommend she follow-up with orthopedics for discussion of knee replacement surgery for  which she is planning to make an appointment.      Relevant Medications   ibuprofen  (ADVIL ) 800 MG tablet   Other Relevant Orders   Ambulatory referral to Physical Therapy   Keloid scar of skin (Chronic)   Remote CABG surgery complicated by keloid scar formation at the sternum. She continues to have issues with irritation, pain, and itching of the area and wishes to see dermatology for further management. Referral placed today.      Polyarthritis with positive rheumatoid factor (HCC) (Chronic)   She has not yet made a f/u appointment with rheumatology for this issue as discussed at last visit. We discussed this again today and she will make an appointment.      Cervical strain, acute   Seen in May 2025 for acute, right neck muscle strain. This is improving with time but still bothersome. Referred to physical therapy at May visit but was not scheduled. Referral placed again today for further improvement in functioning.      Relevant Orders   Ambulatory referral to Physical Therapy   Other Visit Diagnoses       Keloid scar       Relevant Orders   Ambulatory referral to Dermatology       Return in about 6 months (around 01/03/2025).    Ronnald Sergeant, MD

## 2024-07-03 NOTE — Assessment & Plan Note (Signed)
 She has not yet made a f/u appointment with rheumatology for this issue as discussed at last visit. We discussed this again today and she will make an appointment.

## 2024-07-11 NOTE — Progress Notes (Deleted)
 Office Visit Note  Patient: [name]             Date of Birth: [birth year]           MRN: [id number]             PCP: Karna Fellows, MD Referring: Jerrell Cleatus Debby DEWAINE Visit Date: 07/17/2024   Subjective:  No chief complaint on file.   History of Present Illness: [name] is a 68 y.o. female here for follow up for seropositive rheumatoid arthritis.   Previous HPI 04/25/2024  [name] is a 68 y.o. female here for follow up for seropositive rheumatoid arthritis.  She was previously taking methotrexate  10 mg subcu weekly took this for a month twice but discontinued after completing the prescription refill and is also complaining of experiencing shortness of breath by the fourth week of treatment each time.  Also has some persistent cough she states this is more prominent when she takes her medication specifically blames what she thinks is lisinopril  and describes a blue hypertension medication.  Now off any specific RA medicine for the past month does not feel like she has had a severe exacerbation of joint symptoms.  She saw orthopedic surgery clinic about right knee osteoarthritis and was treated with intra-articular steroid injection benefit lasting only a few weeks.  Still with a lot of chronic pain and decreased mobility in the left shoulder.  Recently went on treatment with Augmentin  prescribed on May 26 apparently with what sounds like bilateral submandibular gland swelling and was diagnosed as acute sialoadenitis.  Symptoms are improving still with some tenderness in the area.  Has chronic dry mouth did not notice any new lesions or specific inflamed areas in the mouth.  Continues using Systane for chronic dry eyes last visit reviewed by Dr. Octavia in January.  Has history of episcleritis but appears well-controlled.     Previous HPI 01/19/23 [name] is a 68 y.o. female here for follow up for seropositive RA on methotrexate  10 mg subcu weekly and folic acid  1 mg  daily.  Left shoulder was doing very well after injection from last visit she has noticed somewhat worsening of pain in this area again though.  Her main issue by far is right knee pain and stiffness.  She recently discussed this problem with PCP office with updated x-ray obtained showing severe medial compartment bone-on-bone osteoarthritis with associated varus alignment of the knee.  The other issue limiting her mobility is neuropathy in the legs and feet.   Previous HPI 10/19/22 [name] is a 68 y.o. female here for follow up for seropositive RA. She was prescribed methotrexate  10 mg PO weekly and took this medicine noticing improvement in joint pain symptoms especially arms and hands. However noticed considerable nausea when taking each dose and discontinued the medication after initial 5 week prescription. Currently right shoulder remains doing very well since her injection earlier in the year. Left shoulder is very limited and also right knee is stiff especially after prolonged sitting but not in any exacerbation.    Previous HPI 07/21/22 [name] is a 68 y.o. female here for follow up for follow up for probable seropositive RA.  After our last visit had been prescribed methotrexate  to start as initial low-dose treatment option for RA or even steroid sparing coverage for PMR.  She has been concerned about medication side effects and has taken 0 doses of methotrexate .  However since getting intra-articular steroid  injection for the right shoulder is doing dramatically better her left shoulder is actually more painful now at this time.  She also describes some ongoing joint stiffness in her hands but right hand is also more flexible after the shoulder steroid injection.  She sees finger triggering of her second digit most commonly at night or in the early morning.  She is starting to redevelop some right knee effusion after previous aspiration injection a couple months ago.  Previous synovial  fluid analyses have been bland with white blood cell count very low or up to low 300s.     Previous HPI 04/21/2022 [name] is a 68 y.o. female here for follow up for chronic joint pains especially bilateral shoulder pain with significant morning stiffness and previous concern for PMR with ultrasound findings consistent with rotator cuff tendonitis.  Since our visit she continues having joint pains in several areas although these are not severely limiting her activity.  She has seen some swelling coming and going in the second MCP joint of her right hand.  She also keeps some amount of pain and swelling in the right knee consistently.  Lab tests at our initial visit showed normal sedimentation rate negative hepatitis screening and normal LFTs. Xrays demonstrating OA most severe in 1st CMC joints otherwise not so advanced and no erosions.   Previous HPI 03/31/2022 [name] is a 68 y.o. female here for evaluation of chronic joint pains especially bilateral shoulder pain with significant morning stiffness and previous concern for PMR with ultrasound findings consistent with rotator cuff tendonitis. She has longstanding knee and lumbar spine osteoarthritis which limits mobility. Previous knee aspiration with non inflammatory synovial fluid analysis 2 and 3 years ago. She had concern for GCA due to onset of headaches and highly elevated sedimentation rate test last year, but negative TA biopsy was obtained with Dr. Octavia. She did not take recommended prednisone  course due to side effects. Shoulder pain and also decreased hip flexion mobility ongoing symptoms despite resolution of headache complaints from last year.  More recent inflammatory markers in February or decreased to normal but had low positive rheumatoid factor.   Labs reviewed 12/2021 RF 24.3 CCP neg ESR 11 CRP 6   06/2021 ESR 100   08/2017 HCV neg  No Rheumatology ROS completed.   PMFS History:  Patient Active Problem List    Diagnosis Date Noted   Cervical strain, acute 04/13/2024   History of stroke 01/21/2024   Other supraventricular tachycardia (HCC) 12/16/2023   Hearing loss of both ears due to cerumen impaction [H61.23] 05/27/2023   Polyarthritis with positive rheumatoid factor (HCC) 03/31/2022   Tendinitis of both rotator cuffs 09/07/2021   Keloid scar of skin 09/07/2021   Pes planus of left foot 09/08/2020   Hallux valgus 09/08/2020   Glucose intolerance (impaired glucose tolerance) 07/14/2020   Vitamin D  deficiency 07/14/2020   Depression 10/16/2018   Osteoarthritis of left shoulder 10/24/2017   Peripheral neuropathy 08/19/2017   Atherosclerosis of aorta (HCC) 06/20/2015   Osteoarthritis of right knee 11/07/2014   Health care maintenance 10/09/2014   Coronary atherosclerosis of native coronary artery 01/05/2014   Morbid obesity due to excess calories (HCC) 05/10/2012   Obstructive sleep apnea 03/27/2010   Poor dentition 01/21/2009   GERD 08/06/2008   Hyperlipidemia 10/31/2007   Essential hypertension 06/30/2007    Past Medical History:  Diagnosis Date   Acute urinary retention s/p Foley 01/07/2012   Allergy    Anemia  Asthma    CAD (coronary artery disease)    a. 12/2013 s/p CABG x 5 Dr. Obadiah (LIMA to LAD, SVG to diagonal, SVG to OM1, SVG to OM2, SVG to PDA)   Carotid arterial disease (HCC)    a. 12/2013 Carotid U/S: 1-39% bilat ICA stenosis.   Carpal tunnel syndrome, bilateral    GERD (gastroesophageal reflux disease)    Gingivitis 04/13/2024   Hemorrhoids, internal, with bleeding & prolapse 12/13/2011   Hernia, abdominal    History of blood transfusion    CABG and hysterectomy   History of kidney stones    Hyperlipidemia    Hypertension    Iron  deficiency 12/07/2011   Keloid scar    a. Sternal Keloid s/p CABG with ongoing pain.   Morbid obesity (HCC)    Myocardial infarct (HCC)    2015   Neuropathy    Osteoarthritis    Osteoporosis    PONV (postoperative nausea and  vomiting)    Seasonal allergies    Sleep apnea    no CPAP machine; sleep study 02/2010 REM AHI 61.7/hr, total sleep REM 14.8/hr   Tooth abscess    Uterine cancer (HCC) 05/2014   clinical stage IA grade 1 endometrioid endometrial cancer    Family History  Problem Relation Age of Onset   Heart attack Mother 78   Heart disease Mother    Hypertension Mother    Diabetes Mother    Hypertension Sister    Heart failure Sister    Heart disease Sister    Diabetes Sister    Kidney disease Sister    Kidney disease Brother    Hypertension Daughter    Bipolar disorder Son    Schizophrenia Son    Anxiety disorder Son    Esophageal cancer Other        nephew   Colon cancer Neg Hx    Pancreatic cancer Neg Hx    Stomach cancer Neg Hx    Liver disease Neg Hx    Rectal cancer Neg Hx    Past Surgical History:  Procedure Laterality Date   ARTERY BIOPSY Left 07/10/2021   Procedure: LEFT TEMPORAL ARTERY BIOPSY;  Surgeon: Serene Gaile ORN, MD;  Location: [facility] OR;  Service: Vascular;  Laterality: Left;   bladder tack     BREAST REDUCTION SURGERY Bilateral 10/10/2015   Procedure: BILATERAL MAMMARY REDUCTION  (BREAST) WITH FREE NIPPLE GRAFT;  Surgeon: Earlis Ranks, MD;  Location: [facility] OR;  Service: Plastics;  Laterality: Bilateral;   CARDIAC CATHETERIZATION     CATARACT EXTRACTION Right    COLONOSCOPY  2009   COLONOSCOPY W/ BIOPSIES AND POLYPECTOMY     CORONARY ARTERY BYPASS GRAFT N/A 01/09/2014   Procedure: CORONARY ARTERY BYPASS GRAFTING (CABG) x 5 using left internal mammary artery and right leg greater saphenous vein harvested endoscopically;  Surgeon: Maude Fleeta Ochoa, MD;  Location: [facility] OR;  Service: Open Heart Surgery;  Laterality: N/A;  please use bed extenders and breast binder   INTRAOPERATIVE TRANSESOPHAGEAL ECHOCARDIOGRAM N/A 01/09/2014   Procedure: INTRAOPERATIVE TRANSESOPHAGEAL ECHOCARDIOGRAM;  Surgeon: Maude Fleeta Ochoa, MD;  Location[facility] OR;  Service: Open Heart Surgery;  Laterality:  N/A;   KNEE ARTHROSCOPY  1991   KNEE SURGERY Bilateral 1999   LEFT HEART CATHETERIZATION WITH CORONARY ANGIOGRAM N/A 01/04/2014   Procedure: LEFT HEART CATHETERIZATION WITH CORONARY ANGIOGRAM;  Surgeon: Victory ORN Claudene DOUGLAS, MD;  Location: [facility] CATH LAB;  Service: Cardiovascular;  Laterality: N/A;   LESION EXCISION WITH COMPLEX REPAIR N/A 05/07/2016  Procedure: COMPLEX REPAIR OF CHEST 16 CM;  Surgeon: Earlis Ranks, MD;  Location: [facility] OR;  Service: Plastics;  Laterality: N/A;   MULTIPLE EXTRACTIONS WITH ALVEOLOPLASTY N/A 04/11/2014   Procedure: Extraction of tooth #'s 1,2,8,16 with alveoloplasty, maxillary tuberosity reductions, and gross debridement of remaining teeth.;  Surgeon: Tanda JULIANNA Fanny, DDS;  Location: [facility] OR;  Service: Oral Surgery;  Laterality: N/A;   NM MYOCAR PERF WALL MOTION  01/2010   dipyridamole myoview  - moderate perfusion defect in basal inferoseptal, basal inferior, mid inferoseptal, mid inferior, apical inferior region; EF 56%   TEE WITHOUT CARDIOVERSION  01/2010   EF 60-65%, small, flat, non-infiltrating, calcified, fixed apical/septal mass   TOTAL KNEE ARTHROPLASTY Left 04/29/2011   transanal hemorrhoidal dearterliaization  01/06/2012   with external hemorrhoid removal   UNILATERAL SALPINGECTOMY Right 06/03/2014   Procedure: UNILATERAL SALPINGECTOMY;  Surgeon: Elveria Mungo, MD;  Location: [facility] ORS;  Service: Gynecology;  Laterality: Right;   VAGINAL HYSTERECTOMY N/A 06/03/2014   Procedure: HYSTERECTOMY VAGINAL;  Surgeon: Elveria Mungo, MD;  Location: [facility] ORS;  Service: Gynecology;  Laterality: N/A;   WISDOM TOOTH EXTRACTION     Social History   Social History Narrative   Current Social History 08/27/2020        Patient lives alone in a handicap-accessible ground floor apartment which is 1 story. There are not steps up to the entrance the patient uses.       Patient's method of transportation is personal car or family member.      The highest level  of education was 2 years of college.      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 Right Handed    Are you currently employed ? No    What is your current occupation? Retired    Do you live at home alone? Yes   Who lives with you? No    What type of home do you live in: 1 story or 2 story? One story home              Immunization History  Administered Date(s) Administered   Influenza Split 01/07/2012   Influenza Whole 10/31/2007, 09/18/2009, 09/25/2010   Influenza, Seasonal, Injecte, Preservative Fre 11/28/2012   Influenza,inj,Quad PF,6+ Mos 08/07/2013, 01/02/2014, 08/01/2015, 09/20/2016, 09/05/2017, 10/16/2018   Influenza-Unspecified 09/07/2021   PFIZER(Purple Top)SARS-COV-2 Vaccination 04/10/2020, 05/05/2020   Pneumococcal Conjugate-13 07/14/2020   Pneumococcal Polysaccharide-23 01/07/2012, 10/24/2017   Td 10/15/2009   Tdap 02/23/2021     Objective: Vital Signs: LMP 01/13/2014 Comment: spotting in Feb and bleeding since April   Physical Exam   Musculoskeletal Exam: ***  CDAI Exam: CDAI Score: -- Patient Global: --; Provider Global: -- Swollen: --; Tender: -- Joint Exam 07/17/2024   No joint exam has been documented for this visit   There is currently no information documented on the homunculus. Go to the Rheumatology activity and complete the homunculus  joint exam.  Investigation: No additional findings.  Imaging: No results found.  Recent Labs: Lab Results  Component Value Date   WBC 5.5 01/21/2024   HGB 12.6 01/21/2024   PLT  197 01/21/2024   NA 139 01/21/2024   K 3.8 01/21/2024   CL 106 01/21/2024   CO2 26 01/21/2024   GLUCOSE 107 (H) 01/21/2024   BUN 15 01/21/2024   CREATININE 0.90 01/21/2024   BILITOT 0.3 01/21/2024   ALKPHOS 33 (L) 01/21/2024   AST 16 01/21/2024   ALT 10 01/21/2024   PROT 7.0 01/21/2024   ALBUMIN  3.3 (L) 01/21/2024   CALCIUM  9.1 01/21/2024   GFRAA 99 07/14/2020    Speciality Comments: Patient states she will call Dr. Milford office to schedule PLQ eye exam. Patient to call back with eye exam date.   Procedures:  No procedures performed Allergies: Cephalosporins, Eicosapentaenoic acid (epa) (fish), Fish-derived products, Flagyl  [metronidazole ], Peanuts [peanut oil], Shrimp [shellfish allergy], Latex, Cipro  [ciprofloxacin  hcl], Diovan  hct [valsartan -hydrochlorothiazide ], Methotrexate  derivatives, Aleve  [naproxen ], Other, Prednisone , and Zestril  [lisinopril ]   Assessment / Plan:     Visit Diagnoses: No diagnosis found.  ***  Orders: No orders of the defined types were placed in this encounter.  No orders of the defined types were placed in this encounter.    Follow-Up Instructions: No follow-ups on file.   Kimber Esterly M Daleen Steinhaus, CMA  Note - This record has been created using Animal nutritionist.  Chart creation errors have been sought, but may not always  have been located. Such creation errors do not reflect on  the standard of medical care.

## 2024-07-17 ENCOUNTER — Ambulatory Visit: Admitting: Internal Medicine

## 2024-07-17 DIAGNOSIS — R053 Chronic cough: Secondary | ICD-10-CM

## 2024-07-17 DIAGNOSIS — K1120 Sialoadenitis, unspecified: Secondary | ICD-10-CM

## 2024-07-17 DIAGNOSIS — M0580 Other rheumatoid arthritis with rheumatoid factor of unspecified site: Secondary | ICD-10-CM

## 2024-07-17 DIAGNOSIS — Z79899 Other long term (current) drug therapy: Secondary | ICD-10-CM

## 2024-07-30 ENCOUNTER — Other Ambulatory Visit: Payer: Self-pay | Admitting: Internal Medicine

## 2024-08-13 ENCOUNTER — Telehealth: Payer: Self-pay | Admitting: *Deleted

## 2024-08-13 ENCOUNTER — Ambulatory Visit: Admitting: Obstetrics and Gynecology

## 2024-08-13 NOTE — Telephone Encounter (Signed)
 Pls see 08/13/2024 message in regards to PCS.  Copied from CRM [id number]. Topic: Referral - Request for Referral >> Aug 08, 2024  2:19 PM Fredrica W wrote: Did the patient discuss referral with their provider in the last year? NA - insurance case manager called  (If No - schedule appointment) (If Yes - send message)  Appointment offered? No  Type of order/referral and detailed reason for visit: PCS personal care service evaluation  - needs specific form completed for referral / also need to request mammogram.   Preference of office, provider, location: Fax form to [facility]: [phone]   If referral order, have you been seen by this specialty before? N/A (If Yes, this issue or another issue? When? Where?  Can we respond through MyChart? No

## 2024-08-13 NOTE — Telephone Encounter (Signed)
 PCS form for this patient is in Dr. Terrea box -RED folder # 2 to be completed .

## 2024-08-15 ENCOUNTER — Telehealth: Payer: Self-pay | Admitting: *Deleted

## 2024-08-15 NOTE — Telephone Encounter (Signed)
 PCS form was sent via FAXCOM to [facility]  [phone] for review and assessment appointment. / transmission  was successful.SABRA

## 2024-08-23 ENCOUNTER — Other Ambulatory Visit: Payer: Self-pay | Admitting: Obstetrics and Gynecology

## 2024-08-24 ENCOUNTER — Other Ambulatory Visit: Payer: Self-pay | Admitting: Obstetrics and Gynecology

## 2024-08-26 ENCOUNTER — Other Ambulatory Visit: Payer: Self-pay

## 2024-08-27 ENCOUNTER — Telehealth: Payer: Self-pay | Admitting: *Deleted

## 2024-08-27 ENCOUNTER — Telehealth: Payer: Self-pay | Admitting: Internal Medicine

## 2024-08-27 NOTE — Telephone Encounter (Unsigned)
 Copied from CRM [id number]. Topic: Clinical - Medical Advice >> Aug 27, 2024  2:04 PM Zane F wrote: Reason for CRM:   Caller: Cece  Calling from: [facility] [employer] - Dr. Horald  Calling to confirm if the med consultation faxed over from their office on 08/15/2024. The patient is scheduled to be seen 08/27/2024 at 3:00PM EST to have fill in prescribed. They require clearance from the physician to operate due to patient's heart murmur. Specialist attempted to contact the office but was unsuccessful.  Please utilize information listed below to contact the office or appointment will be cancelled.   Deadline for callback would be 4pm today or tomorrow morning after 8am.   Callback Number: [phone] ( Please feel free to speak with CeCe or Antonio)

## 2024-08-27 NOTE — Telephone Encounter (Signed)
   Pre-operative Risk Assessment    Patient Name: [name]  DOB: 06-01-1956 MRN: [id number]   Date of last office visit: 04/04/24  Date of next office visit: nothing scheduled    Request for Surgical Clearance    Procedure:  Dental Fillings   Date of Surgery:  Clearance 08/27/24                                Surgeon:  Dr. Horald  Surgeon's Group or Practice Name:  [facility]  Phone number:  [phone] Fax number:  [phone]    Type of Clearance Requested:   - Medical    Type of Anesthesia:  None    Additional requests/questions:    SignedSheffield JONELLE Griffin   08/27/2024, 1:56 PM

## 2024-08-27 NOTE — Telephone Encounter (Signed)
   Patient Name: [name]  DOB: 1956/08/02 MRN: [id number]  Primary Cardiologist: Vinie JAYSON Maxcy, MD  Chart reviewed as part of pre-operative protocol coverage.   Dental extractions of 1-2 teeth, cleaning and simple procedures are considered low risk procedures per guidelines and generally do not require any specific cardiac clearance. It is also generally accepted that for extractions of 1-2 teeth, dental cleanings, and simple procedures there is no need to interrupt blood thinner therapy.  SBE prophylaxis is not required for the patient from a cardiac standpoint.  I will route this recommendation to the requesting party via Epic fax function and remove from pre-op pool.  Please call with questions.  Barnie Hila, NP 08/27/2024, 2:22 PM

## 2024-08-28 NOTE — Telephone Encounter (Signed)
 I called and spoke to Cece with [facility] about dental clearance form. Advised Ms. Cece that I have not received the form from their office. She will refax the form to [facility] office.

## 2024-09-19 ENCOUNTER — Other Ambulatory Visit: Payer: Self-pay | Admitting: Internal Medicine

## 2024-09-19 NOTE — Progress Notes (Signed)
 Office Visit Note  Patient: [name]             Date of Birth: 01/31/1956           MRN: [id number]             PCP: Karna Fellows, MD Referring: Jerrell Cleatus Debby DEWAINE Visit Date: 10/03/2024   Subjective:  Joint Pain   Discussed the use of AI scribe software for clinical note transcription with the patient, who gave verbal consent to proceed.  History of Present Illness   [name] is a 68 y.o. female here for follow up  for seropositive rheumatoid arthritis previously on hydroxychloroquine  but current off due to no recent follow up.     She has been told by a previous doctor that her right knee is 'bone on bone,' and she has been engaging in therapy to manage the condition. The patient reports that her knee does not cause pain when sitting or standing. She previously received steroid injections about a year and a half ago, which were effective in relieving her symptoms.  She experiences intermittent pressure and mild pain in her eyes, occasionally alleviated by taking Aleve  about once a week or every other week. An eye exam in July with [facility] noted some recurrence of episcleritis.  Since experiencing a stroke in March, she has had swelling in her gums and is scheduled to see a periodontist for further evaluation.  There is limited mobility in her left shoulder, particularly when raising her arm. She uses a cane mostly held in her right arm due to her knee issues. The shoulder feels 'tight' and painful when lifting it high. She is able to fully abduct when lying flat in bed.      Previous HPI 04/26/2023 [name] is a 68 y.o. female here for follow up for seropositive rheumatoid arthritis.  She was previously taking methotrexate  10 mg subcu weekly took this for a month twice but discontinued after completing the prescription refill and is also complaining of experiencing shortness of breath by the fourth week of treatment each time.  Also has some persistent  cough she states this is more prominent when she takes her medication specifically blames what she thinks is lisinopril  and describes a blue hypertension medication.  Now off any specific RA medicine for the past month does not feel like she has had a severe exacerbation of joint symptoms.  She saw orthopedic surgery clinic about right knee osteoarthritis and was treated with intra-articular steroid injection benefit lasting only a few weeks.  Still with a lot of chronic pain and decreased mobility in the left shoulder.  Recently went on treatment with Augmentin  prescribed on May 26 apparently with what sounds like bilateral submandibular gland swelling and was diagnosed as acute sialoadenitis.  Symptoms are improving still with some tenderness in the area.  Has chronic dry mouth did not notice any new lesions or specific inflamed areas in the mouth.  Continues using Systane for chronic dry eyes last visit reviewed by Dr. Octavia in January.  Has history of episcleritis but appears well-controlled.     Previous HPI 01/19/23 [name] is a 68 y.o. female here for follow up for seropositive RA on methotrexate  10 mg subcu weekly and folic acid  1 mg daily.  Left shoulder was doing very well after injection from last visit she has noticed somewhat worsening of pain in this area again though.  Her main issue by far is  right knee pain and stiffness.  She recently discussed this problem with PCP office with updated x-ray obtained showing severe medial compartment bone-on-bone osteoarthritis with associated varus alignment of the knee.  The other issue limiting her mobility is neuropathy in the legs and feet.   Previous HPI 10/19/22 [name] is a 68 y.o. female here for follow up for seropositive RA. She was prescribed methotrexate  10 mg PO weekly and took this medicine noticing improvement in joint pain symptoms especially arms and hands. However noticed considerable nausea when taking each dose and  discontinued the medication after initial 5 week prescription. Currently right shoulder remains doing very well since her injection earlier in the year. Left shoulder is very limited and also right knee is stiff especially after prolonged sitting but not in any exacerbation.    Previous HPI 07/21/22 [name] is a 68 y.o. female here for follow up for follow up for probable seropositive RA.  After our last visit had been prescribed methotrexate  to start as initial low-dose treatment option for RA or even steroid sparing coverage for PMR.  She has been concerned about medication side effects and has taken 0 doses of methotrexate .  However since getting intra-articular steroid injection for the right shoulder is doing dramatically better her left shoulder is actually more painful now at this time.  She also describes some ongoing joint stiffness in her hands but right hand is also more flexible after the shoulder steroid injection.  She sees finger triggering of her second digit most commonly at night or in the early morning.  She is starting to redevelop some right knee effusion after previous aspiration injection a couple months ago.  Previous synovial fluid analyses have been bland with white blood cell count very low or up to low 300s.     Previous HPI 04/21/2022 [name] is a 68 y.o. female here for follow up for chronic joint pains especially bilateral shoulder pain with significant morning stiffness and previous concern for PMR with ultrasound findings consistent with rotator cuff tendonitis.  Since our visit she continues having joint pains in several areas although these are not severely limiting her activity.  She has seen some swelling coming and going in the second MCP joint of her right hand.  She also keeps some amount of pain and swelling in the right knee consistently.  Lab tests at our initial visit showed normal sedimentation rate negative hepatitis screening and normal LFTs. Xrays  demonstrating OA most severe in 1st CMC joints otherwise not so advanced and no erosions.   Previous HPI 03/31/2022 [name] is a 68 y.o. female here for evaluation of chronic joint pains especially bilateral shoulder pain with significant morning stiffness and previous concern for PMR with ultrasound findings consistent with rotator cuff tendonitis. She has longstanding knee and lumbar spine osteoarthritis which limits mobility. Previous knee aspiration with non inflammatory synovial fluid analysis 2 and 3 years ago. She had concern for GCA due to onset of headaches and highly elevated sedimentation rate test last year, but negative TA biopsy was obtained with Dr. Octavia. She did not take recommended prednisone  course due to side effects. Shoulder pain and also decreased hip flexion mobility ongoing symptoms despite resolution of headache complaints from last year.  More recent inflammatory markers in February or decreased to normal but had low positive rheumatoid factor.   Labs reviewed 12/2021 RF 24.3 CCP neg ESR 11 CRP 6   06/2021 ESR 100  08/2017 HCV neg   Review of Systems  Constitutional:  Negative for fatigue.  HENT:  Positive for mouth dryness. Negative for mouth sores.   Eyes:  Positive for dryness.  Respiratory:  Negative for shortness of breath.   Cardiovascular:  Negative for chest pain and palpitations.  Gastrointestinal:  Positive for constipation. Negative for blood in stool and diarrhea.  Endocrine: Positive for increased urination.  Genitourinary:  Positive for involuntary urination.  Musculoskeletal:  Positive for joint pain, gait problem, joint pain, myalgias, muscle weakness, morning stiffness, muscle tenderness and myalgias. Negative for joint swelling.  Skin:  Positive for rash and sensitivity to sunlight. Negative for color change and hair loss.  Allergic/Immunologic: Negative for susceptible to infections.  Neurological:  Negative for dizziness and  headaches.  Hematological:  Positive for swollen glands.  Psychiatric/Behavioral:  Negative for depressed mood and sleep disturbance. The patient is not nervous/anxious.     PMFS History:  Patient Active Problem List   Diagnosis Date Noted   Cervical strain, acute 04/13/2024   History of stroke 01/21/2024   Other supraventricular tachycardia 12/16/2023   Hearing loss of both ears due to cerumen impaction [H61.23] 05/27/2023   Polyarthritis with positive rheumatoid factor (HCC) 03/31/2022   Tendinitis of both rotator cuffs 09/07/2021   Keloid scar of skin 09/07/2021   Pes planus of left foot 09/08/2020   Hallux valgus 09/08/2020   Glucose intolerance (impaired glucose tolerance) 07/14/2020   Vitamin D  deficiency 07/14/2020   Depression 10/16/2018   Osteoarthritis of left shoulder 10/24/2017   Peripheral neuropathy 08/19/2017   Atherosclerosis of aorta 06/20/2015   Osteoarthritis of right knee 11/07/2014   Health care maintenance 10/09/2014   Coronary atherosclerosis of native coronary artery 01/05/2014   Morbid obesity due to excess calories (HCC) 05/10/2012   Obstructive sleep apnea 03/27/2010   Poor dentition 01/21/2009   GERD 08/06/2008   Hyperlipidemia 10/31/2007   Essential hypertension 06/30/2007    Past Medical History:  Diagnosis Date   Acute urinary retention s/p Foley 01/07/2012   Allergy    Anemia    Asthma    CAD (coronary artery disease)    a. 12/2013 s/p CABG x 5 Dr. Obadiah (LIMA to LAD, SVG to diagonal, SVG to OM1, SVG to OM2, SVG to PDA)   Carotid arterial disease    a. 12/2013 Carotid U/S: 1-39% bilat ICA stenosis.   Carpal tunnel syndrome, bilateral    GERD (gastroesophageal reflux disease)    Gingivitis 04/13/2024   Hemorrhoids, internal, with bleeding & prolapse 12/13/2011   Hernia, abdominal    History of blood transfusion    CABG and hysterectomy   History of kidney stones    Hyperlipidemia    Hypertension    Iron  deficiency 12/07/2011    Keloid scar    a. Sternal Keloid s/p CABG with ongoing pain.   Morbid obesity (HCC)    Myocardial infarct (HCC)    2015   Neuropathy    Osteoarthritis    Osteoporosis    PONV (postoperative nausea and vomiting)    Seasonal allergies    Sleep apnea    no CPAP machine; sleep study 02/2010 REM AHI 61.7/hr, total sleep REM 14.8/hr   Tooth abscess    Uterine cancer (HCC) 05/2014   clinical stage IA grade 1 endometrioid endometrial cancer    Family History  Problem Relation Age of Onset   Heart attack Mother 72   Heart disease Mother    Hypertension Mother    Diabetes  Mother    Hypertension Sister    Heart failure Sister    Heart disease Sister    Diabetes Sister    Kidney disease Sister    Kidney disease Brother    Hypertension Daughter    Bipolar disorder Son    Schizophrenia Son    Anxiety disorder Son    Esophageal cancer Other        nephew   Colon cancer Neg Hx    Pancreatic cancer Neg Hx    Stomach cancer Neg Hx    Liver disease Neg Hx    Rectal cancer Neg Hx    Past Surgical History:  Procedure Laterality Date   ARTERY BIOPSY Left 07/10/2021   Procedure: LEFT TEMPORAL ARTERY BIOPSY;  Surgeon: Serene Gaile ORN, MD;  Location: [facility] OR;  Service: Vascular;  Laterality: Left;   bladder tack     BREAST REDUCTION SURGERY Bilateral 10/10/2015   Procedure: BILATERAL MAMMARY REDUCTION  (BREAST) WITH FREE NIPPLE GRAFT;  Surgeon: Earlis Ranks, MD;  Location: [facility] OR;  Service: Plastics;  Laterality: Bilateral;   CARDIAC CATHETERIZATION     CATARACT EXTRACTION Right    COLONOSCOPY  2009   COLONOSCOPY W/ BIOPSIES AND POLYPECTOMY     CORONARY ARTERY BYPASS GRAFT N/A 01/09/2014   Procedure: CORONARY ARTERY BYPASS GRAFTING (CABG) x 5 using left internal mammary artery and right leg greater saphenous vein harvested endoscopically;  Surgeon: Maude Fleeta Ochoa, MD;  Location: [facility] OR;  Service: Open Heart Surgery;  Laterality: N/A;  please use bed extenders and breast binder    INTRAOPERATIVE TRANSESOPHAGEAL ECHOCARDIOGRAM N/A 01/09/2014   Procedure: INTRAOPERATIVE TRANSESOPHAGEAL ECHOCARDIOGRAM;  Surgeon: Maude Fleeta Ochoa, MD;  Location: [facility] OR;  Service: Open Heart Surgery;  Laterality: N/A;   KNEE ARTHROSCOPY  1991   KNEE SURGERY Bilateral 1999   LEFT HEART CATHETERIZATION WITH CORONARY ANGIOGRAM N/A 01/04/2014   Procedure: LEFT HEART CATHETERIZATION WITH CORONARY ANGIOGRAM;  Surgeon: Victory ORN Claudene DOUGLAS, MD;  Location: [facility] CATH LAB;  Service: Cardiovascular;  Laterality: N/A;   LESION EXCISION WITH COMPLEX REPAIR N/A 05/07/2016   Procedure: COMPLEX REPAIR OF CHEST 16 CM;  Surgeon: Earlis Ranks, MD;  Location: [facility] OR;  Service: Plastics;  Laterality: N/A;   MULTIPLE EXTRACTIONS WITH ALVEOLOPLASTY N/A 04/11/2014   Procedure: Extraction of tooth #'s 1,2,8,16 with alveoloplasty, maxillary tuberosity reductions, and gross debridement of remaining teeth.;  Surgeon: Tanda JULIANNA Fanny, DDS;  Location: [facility] OR;  Service: Oral Surgery;  Laterality: N/A;   NM MYOCAR PERF WALL MOTION  01/2010   dipyridamole myoview  - moderate perfusion defect in basal inferoseptal, basal inferior, mid inferoseptal, mid inferior, apical inferior region; EF 56%   TEE WITHOUT CARDIOVERSION  01/2010   EF 60-65%, small, flat, non-infiltrating, calcified, fixed apical/septal mass   TOTAL KNEE ARTHROPLASTY Left 04/29/2011   transanal hemorrhoidal dearterliaization  01/06/2012   with external hemorrhoid removal   UNILATERAL SALPINGECTOMY Right 06/03/2014   Procedure: UNILATERAL SALPINGECTOMY;  Surgeon: Elveria Mungo, MD;  Location: [facility] ORS;  Service: Gynecology;  Laterality: Right;   VAGINAL HYSTERECTOMY N/A 06/03/2014   Procedure: HYSTERECTOMY VAGINAL;  Surgeon: Elveria Mungo, MD;  Location: [facility] ORS;  Service: Gynecology;  Laterality: N/A;   WISDOM TOOTH EXTRACTION     Social History   Social History Narrative   Current Social History 08/27/2020        Patient lives alone in a  handicap-accessible ground floor apartment which is 1 story. There are not steps up to the entrance the patient uses.  Patient's method of transportation is personal car or family member.      The highest level of education was 2 years of college.      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 Right Handed    Are you currently employed ? No    What is your current occupation? Retired    Do you live at home alone? Yes   Who lives with you? No    What type of home do you live in: 1 story or 2 story? One story home              Immunization History  Administered Date(s) Administered   Influenza Split 01/07/2012   Influenza Whole 10/31/2007, 09/18/2009, 09/25/2010   Influenza, Seasonal, Injecte, Preservative Fre 11/28/2012   Influenza,inj,Quad PF,6+ Mos 08/07/2013, 01/02/2014, 08/01/2015, 09/20/2016, 09/05/2017, 10/16/2018   Influenza-Unspecified 09/07/2021   PFIZER(Purple Top)SARS-COV-2 Vaccination 04/10/2020, 05/05/2020   Pneumococcal Conjugate-13 07/14/2020   Pneumococcal Polysaccharide-23 01/07/2012, 10/24/2017   Td 10/15/2009   Tdap 02/23/2021     Objective: Vital Signs: BP 139/72   Pulse 76   Temp (!) 97.1 F (36.2 C)   Resp 18   Ht 5' 8 (1.727 m)   Wt 283 lb (128.4 kg)   LMP 01/13/2014 Comment: spotting in Feb and bleeding since April  BMI 43.03 kg/m    Physical Exam Constitutional:      Appearance: She is obese.  Eyes:     Conjunctiva/sclera: Conjunctivae normal.  Cardiovascular:     Rate and Rhythm: Normal rate and regular  rhythm.  Pulmonary:     Effort: Pulmonary effort is normal.     Breath sounds: Normal breath sounds.  Skin:    General: Skin is warm and dry.     Findings: No rash.  Neurological:     Mental Status: She is alert.  Psychiatric:        Mood and Affect: Mood normal.      Musculoskeletal Exam:  Left shoulder limited range of motion in all 3 planes, not particularly tender and no swelling to palpation, right shoulder full range of motion Elbows full ROM no tenderness or swelling Wrists full ROM no tenderness or swelling Fingers full ROM no tenderness or swelling, right 2nd finger mildly restricted flexion range of motion Knees with patellofemoral crepitus more prominent on right, decreased flexion ROM, small right knee effusion present Ankles full ROM no tenderness or swelling  Investigation: No additional findings.  Imaging: No results found.  Recent Labs: Lab Results  Component Value Date   WBC 5.5 01/21/2024   HGB 12.6 01/21/2024   PLT 197 01/21/2024   NA 139 01/21/2024   K 3.8 01/21/2024   CL 106 01/21/2024   CO2 26 01/21/2024   GLUCOSE 107 (H) 01/21/2024   BUN 15 01/21/2024   CREATININE 0.90 01/21/2024   BILITOT 0.3 01/21/2024   ALKPHOS 33 (L) 01/21/2024  AST 16 01/21/2024   ALT 10 01/21/2024   PROT 7.0 01/21/2024   ALBUMIN  3.3 (L) 01/21/2024   CALCIUM  9.1 01/21/2024   GFRAA 99 07/14/2020    Speciality Comments: 06/13/24 eye exam episcleritis relapse  Procedures:  Large Joint Inj: R knee on 10/03/2024 12:25 PM Indications: pain and joint swelling Details: 27 G 1.5 in needle, anteromedial approach Medications: 2 mL lidocaine  1 %; 40 mg triamcinolone  acetonide 40 MG/ML Outcome: tolerated well, no immediate complications Procedure, treatment alternatives, risks and benefits explained, specific risks discussed. Consent was given by the patient. Immediately prior to procedure a time out was called to verify the correct patient, procedure, equipment, support staff  and site/side marked as required. Patient was prepped and draped in the usual sterile fashion.     Allergies: Cephalosporins, Eicosapentaenoic acid (epa) (fish), Fish protein-containing drug products, Flagyl  [metronidazole ], Peanuts [peanut oil], Shrimp [shellfish allergy], Latex, Cipro  [ciprofloxacin  hcl], Diovan  hct [valsartan -hydrochlorothiazide ], Methotrexate  and trimetrexate, Aleve  [naproxen ], Other, Prednisone , and Zestril  [lisinopril ]   Assessment / Plan:     Visit Diagnoses: Polyarthritis with positive rheumatoid factor (HCC) - Plan: hydroxychloroquine  (PLAQUENIL ) 200 MG tablet Recurrent eye inflammation and morning joint pain suggest active rheumatoid arthritis. Previous hydroxychloroquine  effective, restarted due to ocular involvement and potential joint inflammation. - Restarted hydroxychloroquine  400 mg daily for rheumatoid arthritis and ocular involvement.  Right knee osteoarthritis Chronic right knee pain with structural degeneration. Swelling attributed to osteoarthritis, not rheumatoid arthritis. - Administered steroid injection to the right knee.  Right shoulder rotator cuff tendinopathy and bursitis Pain and limited range of motion due to rotator cuff tendinopathy and bursitis. No frozen shoulder, but tightness and impingement present. - Recommended daily range of motion exercises focusing on external rotation.      Orders: Orders Placed This Encounter  Procedures   Large Joint Inj: R knee   Meds ordered this encounter  Medications   hydroxychloroquine  (PLAQUENIL ) 200 MG tablet    Sig: Take 2 tablets (400 mg total) by mouth daily.    Dispense:  180 tablet    Refill:  1     Follow-Up Instructions: Return in about 3 months (around 01/03/2025) for RA HCQ restart/inj f/u 3mos.   Lonni LELON Ester, MD  Note - This record has been created using [employer].  Chart creation errors have been sought, but may not always  have been located. Such creation errors do  not reflect on  the standard of medical care.

## 2024-09-19 NOTE — Telephone Encounter (Unsigned)
 Copied from CRM [id number]. Topic: Clinical - Medication Refill >> Sep 19, 2024  9:14 AM Diannia H wrote: Medication: amLODipine  (NORVASC ) 5 MG tablet  Has the patient contacted their pharmacy? Yes (Agent: If no, request that the patient contact the pharmacy for the refill. If patient does not wish to contact the pharmacy document the reason why and proceed with request.) (Agent: If yes, when and what did the pharmacy advise?)  This is the patient's preferred pharmacy:  [id number] [place], [place] - [address]  [place] [address] Phone: [phone] Fax: [phone]  Is this the correct pharmacy for this prescription? Yes If no, delete pharmacy and type the correct one.   Has the prescription been filled recently? No  Is the patient out of the medication? Yes  Has the patient been seen for an appointment in the last year OR does the patient have an upcoming appointment? Yes  Can we respond through MyChart? Yes  Agent: Please be advised that Rx refills may take up to 3 business days. We ask that you follow-up with your pharmacy.

## 2024-09-20 ENCOUNTER — Telehealth: Payer: Self-pay | Admitting: *Deleted

## 2024-09-20 MED ORDER — AMLODIPINE BESYLATE 5 MG PO TABS: 5.0000 mg | ORAL_TABLET | Freq: Every day | ORAL | 3 refills | Status: DC

## 2024-09-20 NOTE — Telephone Encounter (Signed)
 Called patient to reminder her to call the breast center to schedule her mammogram. Patient states she has the phone number for the breast center.

## 2024-09-24 ENCOUNTER — Telehealth: Payer: Self-pay

## 2024-09-24 NOTE — Telephone Encounter (Signed)
 Patient called RN line regarding her concerns about her pessary. Patient would like call back  [place], [place]

## 2024-09-30 ENCOUNTER — Other Ambulatory Visit: Payer: Self-pay | Admitting: Internal Medicine

## 2024-10-02 ENCOUNTER — Encounter: Payer: Self-pay | Admitting: Obstetrics and Gynecology

## 2024-10-02 ENCOUNTER — Ambulatory Visit (INDEPENDENT_AMBULATORY_CARE_PROVIDER_SITE_OTHER): Admitting: Obstetrics and Gynecology

## 2024-10-02 VITALS — BP 143/67 | HR 58 | Ht 68.0 in | Wt 281.0 lb

## 2024-10-02 DIAGNOSIS — N8110 Cystocele, unspecified: Secondary | ICD-10-CM

## 2024-10-02 DIAGNOSIS — N952 Postmenopausal atrophic vaginitis: Secondary | ICD-10-CM

## 2024-10-02 DIAGNOSIS — N993 Prolapse of vaginal vault after hysterectomy: Secondary | ICD-10-CM

## 2024-10-02 DIAGNOSIS — Z960 Presence of urogenital implants: Secondary | ICD-10-CM | POA: Diagnosis not present

## 2024-10-02 DIAGNOSIS — N813 Complete uterovaginal prolapse: Secondary | ICD-10-CM | POA: Diagnosis not present

## 2024-10-02 DIAGNOSIS — N393 Stress incontinence (female) (male): Secondary | ICD-10-CM

## 2024-10-02 NOTE — Progress Notes (Signed)
 [facility]   Subjective:     Chief Complaint:  Chief Complaint  Patient presents with   Pessary Check   History of Present Illness: [name] is a 68 y.o. female with stage IV pelvic organ prolapse who presents for a pessary check. She is using a size #5 cube pessary. The pessary has been working well and she has no complaints. She is using vaginal estrogen. She denies vaginal bleeding.  Past Medical History: Patient  has a past medical history of Acute urinary retention s/p Foley (01/07/2012), Allergy, Anemia, Asthma, CAD (coronary artery disease), Carotid arterial disease, Carpal tunnel syndrome, bilateral, GERD (gastroesophageal reflux disease), Gingivitis (04/13/2024), Hemorrhoids, internal, with bleeding & prolapse (12/13/2011), Hernia, abdominal, History of blood transfusion, History of kidney stones, Hyperlipidemia, Hypertension, Iron  deficiency (12/07/2011), Keloid scar, Morbid obesity (HCC), Myocardial infarct (HCC), Neuropathy, Osteoarthritis, Osteoporosis, PONV (postoperative nausea and vomiting), Seasonal allergies, Sleep apnea, Tooth abscess, and Uterine cancer (HCC) (05/2014).   Past Surgical History: She  has a past surgical history that includes Knee surgery (Bilateral, 1999); Total knee arthroplasty (Left, 04/29/2011); Wisdom tooth extraction; Colonoscopy (2009); Knee arthroscopy (1991); transanal hemorrhoidal dearterliaization (01/06/2012); Coronary artery bypass graft (N/A, 01/09/2014); Intraoprative transesophageal echocardiogram (N/A, 01/09/2014); TEE without cardioversion (01/2010); NM MYOCAR PERF WALL MOTION (01/2010); Cardiac catheterization; Multiple extractions with alveoloplasty (N/A, 04/11/2014); Vaginal hysterectomy (N/A, 06/03/2014); Unilateral salpingectomy (Right, 06/03/2014); left heart catheterization with coronary angiogram (N/A, 01/04/2014); Breast reduction surgery (Bilateral, 10/10/2015); Colonoscopy w/ biopsies and polypectomy; Lesion  excision with complex repair (N/A, 05/07/2016); Cataract extraction (Right); Artery Biopsy (Left, 07/10/2021); and bladder tack.   Medications: She has a current medication list which includes the following prescription(s): albuterol , amlodipine , chlorhexidine , cholecalciferol, clopidogrel , estradiol , gemtesa , ibuprofen , magic mouthwash (nystatin , lidocaine , diphenhydrAMINE ) suspension, pantoprazole , and rosuvastatin .   Allergies: Patient is allergic to cephalosporins, eicosapentaenoic acid (epa) (fish), fish protein-containing drug products, flagyl  [metronidazole ], peanuts [peanut oil], shrimp [shellfish allergy], latex, cipro  [ciprofloxacin  hcl], diovan  hct [valsartan -hydrochlorothiazide ], methotrexate  and trimetrexate, aleve  [naproxen ], other, prednisone , and zestril  [lisinopril ].   Social History: Patient  reports that she has never smoked. She has never been exposed to tobacco smoke. She has never used smokeless tobacco. She reports that she does not drink alcohol  and does not use drugs.      Objective:    Physical Exam: BP (!) 143/67   Pulse (!) 58   Ht 5' 8 (1.727 m)   Wt 281 lb (127.5 kg)   LMP 01/13/2014 Comment: spotting in Feb and bleeding since April  BMI 42.73 kg/m  Gen: No apparent distress, A&O x 3. Detailed Urogynecologic Evaluation:  Pelvic Exam: Normal external female genitalia; Bartholin's and Skene's glands normal in appearance; urethral meatus normal in appearance, no urethral masses or discharge. The pessary was noted to be in place. It was removed and cleaned. Speculum exam revealed mild excoriation in the vagina on the right vaginal wall. The vagina was wiped clean with hibiclens  and a small area was treated with silver nitrate due to bleeding. The pessary was replaced. It was comfortable to the patient and fit well.   Assessment/Plan:    Assessment: [name] is a 68 y.o. with stage IV pelvic organ prolapse here for a pessary check. She is doing  well.  Plan: She will keep the pessary in place until next visit. She will continue to use estrogen. She will follow-up in 3 months for a pessary check or sooner as needed.  Patient has been thinking about surgical intervention. I agree that the pessaries do  not seem to be a good long term plan and that she would be potentially happier and have a higher quality of life with permanent surgical management. Patient to follow up with Dr. Marilynne to discuss her options.

## 2024-10-03 ENCOUNTER — Ambulatory Visit: Attending: Internal Medicine | Admitting: Internal Medicine

## 2024-10-03 ENCOUNTER — Encounter: Payer: Self-pay | Admitting: Internal Medicine

## 2024-10-03 VITALS — BP 139/72 | HR 76 | Temp 97.1°F | Resp 18 | Ht 68.0 in | Wt 283.0 lb

## 2024-10-03 DIAGNOSIS — R053 Chronic cough: Secondary | ICD-10-CM

## 2024-10-03 DIAGNOSIS — K1120 Sialoadenitis, unspecified: Secondary | ICD-10-CM | POA: Diagnosis not present

## 2024-10-03 DIAGNOSIS — M0580 Other rheumatoid arthritis with rheumatoid factor of unspecified site: Secondary | ICD-10-CM | POA: Diagnosis not present

## 2024-10-03 DIAGNOSIS — M1711 Unilateral primary osteoarthritis, right knee: Secondary | ICD-10-CM

## 2024-10-03 DIAGNOSIS — Z79899 Other long term (current) drug therapy: Secondary | ICD-10-CM | POA: Diagnosis not present

## 2024-10-03 MED ORDER — HYDROXYCHLOROQUINE SULFATE 200 MG PO TABS: 400.0000 mg | ORAL_TABLET | Freq: Every day | ORAL | 1 refills | Status: AC

## 2024-10-21 MED ORDER — LIDOCAINE HCL 1 % IJ SOLN
2.0000 mL | INTRAMUSCULAR | Status: AC | PRN
  Administered 2024-10-03: 2 mL

## 2024-10-21 MED ORDER — TRIAMCINOLONE ACETONIDE 40 MG/ML IJ SUSP
40.0000 mg | INTRAMUSCULAR | Status: AC | PRN
  Administered 2024-10-03: 40 mg via INTRA_ARTICULAR

## 2024-10-29 ENCOUNTER — Other Ambulatory Visit: Payer: Self-pay | Admitting: Internal Medicine

## 2024-10-29 ENCOUNTER — Ambulatory Visit: Admitting: Podiatry

## 2024-10-31 ENCOUNTER — Telehealth: Payer: Self-pay | Admitting: *Deleted

## 2024-10-31 NOTE — Telephone Encounter (Signed)
 Called patient no answer/ [facility]- they have called the patient three times trying to schedule her for the mammogram-patient was to give them a call back to schedule her appointment and as of today she has not called back to get scheduled.

## 2024-11-28 ENCOUNTER — Ambulatory Visit: Admitting: Neurology

## 2024-11-28 ENCOUNTER — Other Ambulatory Visit: Payer: Self-pay | Admitting: Student

## 2024-11-29 ENCOUNTER — Other Ambulatory Visit: Payer: Self-pay | Admitting: Internal Medicine

## 2024-11-29 NOTE — Telephone Encounter (Signed)
 Was prescribed in Aug but Dr Karna for a limited time.  Will refill once and send patient message to follow up with ortho

## 2024-12-03 ENCOUNTER — Ambulatory Visit: Admitting: Obstetrics and Gynecology

## 2024-12-03 ENCOUNTER — Encounter: Payer: Self-pay | Admitting: Obstetrics and Gynecology

## 2024-12-03 ENCOUNTER — Encounter: Payer: Self-pay | Admitting: *Deleted

## 2024-12-03 VITALS — BP 130/83 | HR 88

## 2024-12-03 DIAGNOSIS — N393 Stress incontinence (female) (male): Secondary | ICD-10-CM

## 2024-12-03 DIAGNOSIS — N813 Complete uterovaginal prolapse: Secondary | ICD-10-CM | POA: Diagnosis not present

## 2024-12-03 DIAGNOSIS — N993 Prolapse of vaginal vault after hysterectomy: Secondary | ICD-10-CM

## 2024-12-03 NOTE — Progress Notes (Signed)
 [facility]   Subjective:     Chief Complaint:  Chief Complaint  Patient presents with   Follow-up   History of Present Illness: [name] is a 69 y.o. female with stage III pelvic organ prolapse and SUI who presents for a pessary check. She is using a size #5 cube pessary. The pessary was in for a few months then fell out when she had a BM. Not having any difficulty urinating without the pessary.   Was previously planning for surgery but then had a stroke in March 2025. She is interested in surgery again. She is currently taking plavix .   Urodynamic Impression (01/2022):  1. Sensation was normal; capacity was normal 2. Stress Incontinence was demonstrated. 3.  Emptying was normal with a normal PVR.(60ml with reduction of prolapse)   Past Medical History: Patient  has a past medical history of Acute urinary retention s/p Foley (01/07/2012), Allergy, Anemia, Asthma, CAD (coronary artery disease), Carotid arterial disease, Carpal tunnel syndrome, bilateral, GERD (gastroesophageal reflux disease), Gingivitis (04/13/2024), Hemorrhoids, internal, with bleeding & prolapse (12/13/2011), Hernia, abdominal, History of blood transfusion, History of kidney stones, Hyperlipidemia, Hypertension, Iron  deficiency (12/07/2011), Keloid scar, Morbid obesity (HCC), Myocardial infarct (HCC), Neuropathy, Osteoarthritis, Osteoporosis, PONV (postoperative nausea and vomiting), Seasonal allergies, Sleep apnea, Tooth abscess, and Uterine cancer (HCC) (05/2014).   Past Surgical History: She  has a past surgical history that includes Knee surgery (Bilateral, 1999); Total knee arthroplasty (Left, 04/29/2011); Wisdom tooth extraction; Colonoscopy (2009); Knee arthroscopy (1991); transanal hemorrhoidal dearterliaization (01/06/2012); Coronary artery bypass graft (N/A, 01/09/2014); Intraoprative transesophageal echocardiogram (N/A, 01/09/2014); TEE without cardioversion (01/2010); NM MYOCAR PERF WALL  MOTION (01/2010); Cardiac catheterization; Multiple extractions with alveoloplasty (N/A, 04/11/2014); Vaginal hysterectomy (N/A, 06/03/2014); Unilateral salpingectomy (Right, 06/03/2014); left heart catheterization with coronary angiogram (N/A, 01/04/2014); Breast reduction surgery (Bilateral, 10/10/2015); Colonoscopy w/ biopsies and polypectomy; Lesion excision with complex repair (N/A, 05/07/2016); Cataract extraction (Right); Artery Biopsy (Left, 07/10/2021); and bladder tack.   Medications: She has a current medication list which includes the following prescription(s): albuterol , amlodipine , chlorhexidine , cholecalciferol, clopidogrel , estradiol , gemtesa , hydroxychloroquine , ibuprofen , ibuprofen , magic mouthwash (nystatin , lidocaine , diphenhydrAMINE ) suspension, pantoprazole , and rosuvastatin .   Allergies: Patient is allergic to cephalosporins, eicosapentaenoic acid (epa) (fish), fish protein-containing drug products, flagyl  [metronidazole ], peanuts [peanut oil], shrimp [shellfish allergy], latex, cipro  [ciprofloxacin  hcl], diovan  hct [valsartan -hydrochlorothiazide ], methotrexate  and trimetrexate, aleve  [naproxen ], other, prednisone , and zestril  [lisinopril ].   Social History: Patient  reports that she has never smoked. She has never been exposed to tobacco smoke. She has never used smokeless tobacco. She reports that she does not drink alcohol  and does not use drugs.      Objective:    Physical Exam: BP 130/83   Pulse 88   LMP 01/13/2014 Comment: spotting in Feb and bleeding since April Gen: No apparent distress, A&O x 3. Detailed Urogynecologic Evaluation:  Pelvic Exam: Normal external female genitalia; #5 cube pessary placed without difficulty. Stayed in place with valsalva.    POP-Q (10/2023)   3                                            Aa   3  Ba   -7                                              C    7                                             Gh   3.5                                            Pb   9                                            tvl    -3                                            Ap   -3                                            Bp                                                  D     Assessment/Plan:    Assessment: Ms. Balla is a 69 y.o. with stage III pelvic organ prolapse and SUI  Plan:  - She does not want a surgery with mesh. Also reviewed option of colpocleisis as it has a better success rate however, she wants to have the option of sexual activity.   Plan for surgery: Exam under anesthesia, anterior repair, sacrospinous ligament fixation, perineorrhaphy, urethral bulking, cystoscopy, possible posterior repair  - We reviewed the patient's specific anatomic and functional findings, with the assistance of diagrams, and together finalized the above procedure. The planned surgical procedures were discussed along with the surgical risks outlined below, which were also provided on a detailed handout. Additional treatment options including expectant management, conservative management, medical management were discussed where appropriate.  We reviewed the benefits and risks of each treatment option.   General Surgical Risks: For all procedures, there are risks of bleeding, infection, damage to surrounding organs including but not limited to bowel, bladder, blood vessels, ureters and nerves, and need for further surgery if an injury were to occur. These risks are all low with minimally invasive surgery.   There are risks of numbness and weakness at any body site or buttock/rectal pain.  It is possible that baseline pain can be worsened by surgery, either with or without mesh. If surgery is vaginal, there is also a low risk of possible conversion to laparoscopy or open abdominal incision where indicated. Very rare risks include blood transfusion, blood clot, heart attack, pneumonia, or death.  There is also a risk of short-term postoperative urinary retention with need to use a catheter. About half of patients need to go home from surgery with a catheter, which is then later removed in the office. The risk of long-term need for a catheter is very low. There is also a risk of worsening of overactive bladder.   Urethral bulking:  We discussed success rate of approximately 70-80% and possible need for second injection. We reviewed that this is not a permanent procedure and the Bulkamid does become less effective over time. Risks reviewed including injury to bladder or urethra, UTI, urinary retention and hematuria.   Prolapse (with or without mesh): Risk factors for surgical failure  include things that put pressure on your pelvis and the surgical repair, including obesity, chronic cough, and heavy lifting or straining (including lifting children or adults, straining on the toilet, or lifting heavy objects such as furniture or anything weighing >25 lbs. Risks of recurrence is 20-30% with vaginal native tissue repair and a less than 10% with sacrocolpopexy with mesh.     - For preop Visit:  She is required to have a visit within 30 days of her surgery.   - Medical clearance: required Letter sent to Dr Mona (Cardiology) and Dr Tobie (Neurology) requesting risk stratification and medical optimization. - Anticoagulant use: Yes- Plavix  - Medicaid Hysterectomy form: No - Accepts blood transfusion: Yes - Expected length of stay: outpatient  Request sent for surgery scheduling.   Rosaline LOISE Caper, MD

## 2024-12-10 ENCOUNTER — Ambulatory Visit: Admitting: Neurology

## 2024-12-16 ENCOUNTER — Other Ambulatory Visit: Payer: Self-pay | Admitting: Internal Medicine

## 2024-12-18 NOTE — Telephone Encounter (Signed)
 Medication sent to pharmacy

## 2024-12-26 NOTE — Assessment & Plan Note (Signed)
 SABRA

## 2024-12-26 NOTE — Progress Notes (Unsigned)
 "  Office Visit Note  Patient: [name]             Date of Birth: [birth year]           MRN: [id number]             PCP: Karna Fellows, MD Referring: Karna Fellows, MD Visit Date: 01/07/2025   Subjective:  No chief complaint on file.   History of Present Illness: [name] is a 69 y.o. female here for follow up for seropositive rheumatoid arthritis previously on hydroxychloroquine  but current off due to no recent follow up.      Previous HPI 10/03/2024 [name] is a 69 y.o. female here for follow up  for seropositive rheumatoid arthritis previously on hydroxychloroquine  but current off due to no recent follow up.      She has been told by a previous doctor that her right knee is 'bone on bone,' and she has been engaging in therapy to manage the condition. The patient reports that her knee does not cause pain when sitting or standing. She previously received steroid injections about a year and a half ago, which were effective in relieving her symptoms.   She experiences intermittent pressure and mild pain in her eyes, occasionally alleviated by taking Aleve  about once a week or every other week. An eye exam in July with [facility] noted some recurrence of episcleritis.   Since experiencing a stroke in March, she has had swelling in her gums and is scheduled to see a periodontist for further evaluation.   There is limited mobility in her left shoulder, particularly when raising her arm. She uses a cane mostly held in her right arm due to her knee issues. The shoulder feels 'tight' and painful when lifting it high. She is able to fully abduct when lying flat in bed.        Previous HPI 04/26/2023 [name] is a 69 y.o. female here for follow up for seropositive rheumatoid arthritis.  She was previously taking methotrexate  10 mg subcu weekly took this for a month twice but discontinued after completing the prescription refill and is also complaining of experiencing  shortness of breath by the fourth week of treatment each time.  Also has some persistent cough she states this is more prominent when she takes her medication specifically blames what she thinks is lisinopril  and describes a blue hypertension medication.  Now off any specific RA medicine for the past month does not feel like she has had a severe exacerbation of joint symptoms.  She saw orthopedic surgery clinic about right knee osteoarthritis and was treated with intra-articular steroid injection benefit lasting only a few weeks.  Still with a lot of chronic pain and decreased mobility in the left shoulder.  Recently went on treatment with Augmentin  prescribed on May 26 apparently with what sounds like bilateral submandibular gland swelling and was diagnosed as acute sialoadenitis.  Symptoms are improving still with some tenderness in the area.  Has chronic dry mouth did not notice any new lesions or specific inflamed areas in the mouth.  Continues using Systane for chronic dry eyes last visit reviewed by Dr. Octavia in January.  Has history of episcleritis but appears well-controlled.     Previous HPI 01/19/23 [name] is a 69 y.o. female here for follow up for seropositive RA on methotrexate  10 mg subcu weekly and folic acid  1 mg daily.  Left shoulder was doing very well after  injection from last visit she has noticed somewhat worsening of pain in this area again though.  Her main issue by far is right knee pain and stiffness.  She recently discussed this problem with PCP office with updated x-ray obtained showing severe medial compartment bone-on-bone osteoarthritis with associated varus alignment of the knee.  The other issue limiting her mobility is neuropathy in the legs and feet.   Previous HPI 10/19/22 [name] is a 68 y.o. female here for follow up for seropositive RA. She was prescribed methotrexate  10 mg PO weekly and took this medicine noticing improvement in joint pain symptoms  especially arms and hands. However noticed considerable nausea when taking each dose and discontinued the medication after initial 5 week prescription. Currently right shoulder remains doing very well since her injection earlier in the year. Left shoulder is very limited and also right knee is stiff especially after prolonged sitting but not in any exacerbation.    Previous HPI 07/21/22 [name] is a 69 y.o. female here for follow up for follow up for probable seropositive RA.  After our last visit had been prescribed methotrexate  to start as initial low-dose treatment option for RA or even steroid sparing coverage for PMR.  She has been concerned about medication side effects and has taken 0 doses of methotrexate .  However since getting intra-articular steroid injection for the right shoulder is doing dramatically better her left shoulder is actually more painful now at this time.  She also describes some ongoing joint stiffness in her hands but right hand is also more flexible after the shoulder steroid injection.  She sees finger triggering of her second digit most commonly at night or in the early morning.  She is starting to redevelop some right knee effusion after previous aspiration injection a couple months ago.  Previous synovial fluid analyses have been bland with white blood cell count very low or up to low 300s.     Previous HPI 04/21/2022 [name] is a 69 y.o. female here for follow up for chronic joint pains especially bilateral shoulder pain with significant morning stiffness and previous concern for PMR with ultrasound findings consistent with rotator cuff tendonitis.  Since our visit she continues having joint pains in several areas although these are not severely limiting her activity.  She has seen some swelling coming and going in the second MCP joint of her right hand.  She also keeps some amount of pain and swelling in the right knee consistently.  Lab tests at our initial  visit showed normal sedimentation rate negative hepatitis screening and normal LFTs. Xrays demonstrating OA most severe in 1st CMC joints otherwise not so advanced and no erosions.   Previous HPI 03/31/2022 DALANEY NEEDLE is a 69 y.o. female here for evaluation of chronic joint pains especially bilateral shoulder pain with significant morning stiffness and previous concern for PMR with ultrasound findings consistent with rotator cuff tendonitis. She has longstanding knee and lumbar spine osteoarthritis which limits mobility. Previous knee aspiration with non inflammatory synovial fluid analysis 2 and 3 years ago. She had concern for GCA due to onset of headaches and highly elevated sedimentation rate test last year, but negative TA biopsy was obtained with Dr. Octavia. She did not take recommended prednisone  course due to side effects. Shoulder pain and also decreased hip flexion mobility ongoing symptoms despite resolution of headache complaints from last year.  More recent inflammatory markers in February or decreased to normal but had low  positive rheumatoid factor.   Labs reviewed 12/2021 RF 24.3 CCP neg ESR 11 CRP 6   06/2021 ESR 100   08/2017 HCV neg     No Rheumatology ROS completed.   PMFS History:  Patient Active Problem List   Diagnosis Date Noted   Cervical strain, acute 04/13/2024   History of stroke 01/21/2024   Other supraventricular tachycardia 12/16/2023   Hearing loss of both ears due to cerumen impaction [H61.23] 05/27/2023   Polyarthritis with positive rheumatoid factor (HCC) 03/31/2022   Tendinitis of both rotator cuffs 09/07/2021   Keloid scar of skin 09/07/2021   Pes planus of left foot 09/08/2020   Hallux valgus 09/08/2020   Glucose intolerance (impaired glucose tolerance) 07/14/2020   Vitamin D  deficiency 07/14/2020   Depression 10/16/2018   Osteoarthritis of left shoulder 10/24/2017   Peripheral neuropathy 08/19/2017   Atherosclerosis of aorta 06/20/2015    Osteoarthritis of right knee 11/07/2014   Health care maintenance 10/09/2014   Coronary atherosclerosis of native coronary artery 01/05/2014   Morbid obesity due to excess calories (HCC) 05/10/2012   Obstructive sleep apnea 03/27/2010   Poor dentition 01/21/2009   GERD 08/06/2008   Hyperlipidemia 10/31/2007   Essential hypertension 06/30/2007    Past Medical History:  Diagnosis Date   Acute urinary retention s/p Foley 01/07/2012   Allergy    Anemia    Asthma    CAD (coronary artery disease)    a. 12/2013 s/p CABG x 5 Dr. Obadiah (LIMA to LAD, SVG to diagonal, SVG to OM1, SVG to OM2, SVG to PDA)   Carotid arterial disease    a. 12/2013 Carotid U/S: 1-39% bilat ICA stenosis.   Carpal tunnel syndrome, bilateral    GERD (gastroesophageal reflux disease)    Gingivitis 04/13/2024   Hemorrhoids, internal, with bleeding & prolapse 12/13/2011   Hernia, abdominal    History of blood transfusion    CABG and hysterectomy   History of kidney stones    Hyperlipidemia    Hypertension    Iron  deficiency 12/07/2011   Keloid scar    a. Sternal Keloid s/p CABG with ongoing pain.   Morbid obesity (HCC)    Myocardial infarct (HCC)    2015   Neuropathy    Osteoarthritis    Osteoporosis    PONV (postoperative nausea and vomiting)    Seasonal allergies    Sleep apnea    no CPAP machine; sleep study 02/2010 REM AHI 61.7/hr, total sleep REM 14.8/hr   Tooth abscess    Uterine cancer (HCC) 05/2014   clinical stage IA grade 1 endometrioid endometrial cancer    Family History  Problem Relation Age of Onset   Heart attack Mother 60   Heart disease Mother    Hypertension Mother    Diabetes Mother    Hypertension Sister    Heart failure Sister    Heart disease Sister    Diabetes Sister    Kidney disease Sister    Kidney disease Brother    Hypertension Daughter    Bipolar disorder Son    Schizophrenia Son    Anxiety disorder Son    Esophageal cancer Other        nephew   Colon cancer Neg  Hx    Pancreatic cancer Neg Hx    Stomach cancer Neg Hx    Liver disease Neg Hx    Rectal cancer Neg Hx    Past Surgical History:  Procedure Laterality Date   ARTERY BIOPSY Left 07/10/2021  Procedure: LEFT TEMPORAL ARTERY BIOPSY;  Surgeon: Serene Gaile ORN, MD;  Location: [facility] OR;  Service: Vascular;  Laterality: Left;   bladder tack     BREAST REDUCTION SURGERY Bilateral 10/10/2015   Procedure: BILATERAL MAMMARY REDUCTION  (BREAST) WITH FREE NIPPLE GRAFT;  Surgeon: Earlis Ranks, MD;  Location: [facility] OR;  Service: Plastics;  Laterality: Bilateral;   CARDIAC CATHETERIZATION     CATARACT EXTRACTION Right    COLONOSCOPY  2009   COLONOSCOPY W/ BIOPSIES AND POLYPECTOMY     CORONARY ARTERY BYPASS GRAFT N/A 01/09/2014   Procedure: CORONARY ARTERY BYPASS GRAFTING (CABG) x 5 using left internal mammary artery and right leg greater saphenous vein harvested endoscopically;  Surgeon: Maude Fleeta Ochoa, MD;  Location: [facility] OR;  Service: Open Heart Surgery;  Laterality: N/A;  please use bed extenders and breast binder   INTRAOPERATIVE TRANSESOPHAGEAL ECHOCARDIOGRAM N/A 01/09/2014   Procedure: INTRAOPERATIVE TRANSESOPHAGEAL ECHOCARDIOGRAM;  Surgeon: Maude Fleeta Ochoa, MD;  Location: [facility] OR;  Service: Open Heart Surgery;  Laterality: N/A;   KNEE ARTHROSCOPY  1991   KNEE SURGERY Bilateral 1999   LEFT HEART CATHETERIZATION WITH CORONARY ANGIOGRAM N/A 01/04/2014   Procedure: LEFT HEART CATHETERIZATION WITH CORONARY ANGIOGRAM;  Surgeon: Victory ORN Claudene DOUGLAS, MD;  Location: [facility] CATH LAB;  Service: Cardiovascular;  Laterality: N/A;   LESION EXCISION WITH COMPLEX REPAIR N/A 05/07/2016   Procedure: COMPLEX REPAIR OF CHEST 16 CM;  Surgeon: Earlis Ranks, MD;  Location: [facility] OR;  Service: Plastics;  Laterality: N/A;   MULTIPLE EXTRACTIONS WITH ALVEOLOPLASTY N/A 04/11/2014   Procedure: Extraction of tooth #'s 1,2,8,16 with alveoloplasty, maxillary tuberosity reductions, and gross debridement of remaining teeth.;  Surgeon:  Tanda JULIANNA Fanny, DDS;  Location: [facility] OR;  Service: Oral Surgery;  Laterality: N/A;   NM MYOCAR PERF WALL MOTION  01/2010   dipyridamole myoview  - moderate perfusion defect in basal inferoseptal, basal inferior, mid inferoseptal, mid inferior, apical inferior region; EF 56%   TEE WITHOUT CARDIOVERSION  01/2010   EF 60-65%, small, flat, non-infiltrating, calcified, fixed apical/septal mass   TOTAL KNEE ARTHROPLASTY Left 04/29/2011   transanal hemorrhoidal dearterliaization  01/06/2012   with external hemorrhoid removal   UNILATERAL SALPINGECTOMY Right 06/03/2014   Procedure: UNILATERAL SALPINGECTOMY;  Surgeon: Elveria Mungo, MD;  Location: [facility] ORS;  Service: Gynecology;  Laterality: Right;   VAGINAL HYSTERECTOMY N/A 06/03/2014   Procedure: HYSTERECTOMY VAGINAL;  Surgeon: Elveria Mungo, MD;  Location: [facility] ORS;  Service: Gynecology;  Laterality: N/A;   WISDOM TOOTH EXTRACTION     Social History   Social History Narrative   Current Social History 08/27/2020        Patient lives alone in a handicap-accessible ground floor apartment which is 1 story. There are not steps up to the entrance the patient uses.       Patient's method of transportation is personal car or family member.      The highest level of education was 2 years of college.      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 Right Handed    Are you currently employed ? No    What is your current occupation? Retired    Do you live at home alone? Yes   Who lives with you? No    What  type of home do you live in: 1 story or 2 story? One story home              Immunization History  Administered Date(s) Administered   Influenza Split 01/07/2012   Influenza Whole 10/31/2007, 09/18/2009, 09/25/2010   Influenza, Seasonal, Injecte, Preservative Fre 11/28/2012   Influenza,inj,Quad PF,6+ Mos 08/07/2013, 01/02/2014, 08/01/2015, 09/20/2016, 09/05/2017, 10/16/2018   Influenza-Unspecified 09/07/2021   PFIZER(Purple Top)SARS-COV-2 Vaccination 04/10/2020, 05/05/2020   Pneumococcal Conjugate-13 07/14/2020   Pneumococcal Polysaccharide-23 01/07/2012, 10/24/2017   Td 10/15/2009   Tdap 02/23/2021     Objective: Vital Signs: LMP 01/13/2014 Comment: spotting in Feb and bleeding since April   Physical Exam   Musculoskeletal Exam: ***   Investigation: No additional findings.  Imaging: No results found.  Recent Labs: Lab Results  Component Value Date   WBC 5.5 01/21/2024   HGB 12.6 01/21/2024   PLT 197 01/21/2024   NA 139 01/21/2024   K 3.8 01/21/2024   CL 106 01/21/2024   CO2 26 01/21/2024   GLUCOSE 107 (H) 01/21/2024   BUN 15 01/21/2024   CREATININE 0.90 01/21/2024   BILITOT 0.3 01/21/2024   ALKPHOS 33 (L) 01/21/2024   AST 16 01/21/2024   ALT 10 01/21/2024   PROT 7.0 01/21/2024   ALBUMIN  3.3 (L) 01/21/2024   CALCIUM  9.1 01/21/2024   GFRAA 99 07/14/2020    Speciality Comments: 06/13/24 eye exam episcleritis relapse  Procedures:  No procedures performed Allergies: Cephalosporins, Eicosapentaenoic acid (epa) (fish), Fish protein-containing drug products, Flagyl  [metronidazole ], Peanuts [peanut oil], Shrimp [shellfish allergy], Latex, Cipro  [ciprofloxacin  hcl], Diovan  hct [valsartan -hydrochlorothiazide ], Methotrexate  and trimetrexate, Aleve  [naproxen ], Other, Prednisone , and Zestril  [lisinopril ]   Assessment / Plan:     Visit Diagnoses:  Assessment & Plan Polyarthritis with positive rheumatoid factor (HCC)      ***  Follow-Up Instructions: No  follow-ups on file.   Hallel Denherder M Allannah Kempen, CMA  Note - This record has been created using Animal nutritionist.  Chart creation errors have been sought, but may not always  have been located. Such creation errors do not reflect on  the standard of medical care. "

## 2024-12-27 ENCOUNTER — Telehealth: Payer: Self-pay | Admitting: *Deleted

## 2024-12-27 NOTE — Telephone Encounter (Signed)
 Mammogram appointment 01-21-2025 @ 12:00 noon to arrive 11:45 am / appointment mailed to the patient with the information regarding the 75.00 no show fee.

## 2025-01-03 ENCOUNTER — Ambulatory Visit: Payer: Self-pay | Admitting: Student

## 2025-01-07 ENCOUNTER — Ambulatory Visit: Admitting: Internal Medicine

## 2025-01-07 DIAGNOSIS — M0580 Other rheumatoid arthritis with rheumatoid factor of unspecified site: Secondary | ICD-10-CM

## 2025-01-21 ENCOUNTER — Ambulatory Visit

## 2025-02-06 ENCOUNTER — Ambulatory Visit: Admitting: Physician Assistant
